# Patient Record
Sex: Male | Born: 1945 | Race: White | Hispanic: No | Marital: Married | Smoking: Never smoker
Health system: Southern US, Community
[De-identification: ages and names within clinical notes are randomized; demographics above are authoritative.]

## PROBLEM LIST (undated history)

## (undated) DIAGNOSIS — D649 Anemia, unspecified: Secondary | ICD-10-CM

## (undated) DIAGNOSIS — Z8669 Personal history of other diseases of the nervous system and sense organs: Secondary | ICD-10-CM

## (undated) DIAGNOSIS — Z8659 Personal history of other mental and behavioral disorders: Secondary | ICD-10-CM

## (undated) DIAGNOSIS — I7121 Aneurysm of the ascending aorta, without rupture: Secondary | ICD-10-CM

## (undated) DIAGNOSIS — C4492 Squamous cell carcinoma of skin, unspecified: Secondary | ICD-10-CM

## (undated) DIAGNOSIS — R112 Nausea with vomiting, unspecified: Secondary | ICD-10-CM

## (undated) DIAGNOSIS — J189 Pneumonia, unspecified organism: Secondary | ICD-10-CM

## (undated) DIAGNOSIS — Z8719 Personal history of other diseases of the digestive system: Secondary | ICD-10-CM

## (undated) DIAGNOSIS — K219 Gastro-esophageal reflux disease without esophagitis: Secondary | ICD-10-CM

## (undated) DIAGNOSIS — H269 Unspecified cataract: Secondary | ICD-10-CM

## (undated) DIAGNOSIS — A6920 Lyme disease, unspecified: Secondary | ICD-10-CM

## (undated) DIAGNOSIS — Z79811 Long term (current) use of aromatase inhibitors: Secondary | ICD-10-CM

## (undated) DIAGNOSIS — Z87898 Personal history of other specified conditions: Secondary | ICD-10-CM

## (undated) DIAGNOSIS — F1910 Other psychoactive substance abuse, uncomplicated: Secondary | ICD-10-CM

## (undated) DIAGNOSIS — Z9689 Presence of other specified functional implants: Secondary | ICD-10-CM

## (undated) DIAGNOSIS — G8929 Other chronic pain: Secondary | ICD-10-CM

## (undated) DIAGNOSIS — E785 Hyperlipidemia, unspecified: Secondary | ICD-10-CM

## (undated) DIAGNOSIS — N200 Calculus of kidney: Secondary | ICD-10-CM

## (undated) DIAGNOSIS — B001 Herpesviral vesicular dermatitis: Secondary | ICD-10-CM

## (undated) DIAGNOSIS — G479 Sleep disorder, unspecified: Secondary | ICD-10-CM

## (undated) DIAGNOSIS — D689 Coagulation defect, unspecified: Secondary | ICD-10-CM

## (undated) DIAGNOSIS — R519 Headache, unspecified: Secondary | ICD-10-CM

## (undated) DIAGNOSIS — T7840XA Allergy, unspecified, initial encounter: Secondary | ICD-10-CM

## (undated) DIAGNOSIS — Z9889 Other specified postprocedural states: Secondary | ICD-10-CM

## (undated) DIAGNOSIS — R55 Syncope and collapse: Secondary | ICD-10-CM

## (undated) DIAGNOSIS — R911 Solitary pulmonary nodule: Secondary | ICD-10-CM

## (undated) DIAGNOSIS — M5416 Radiculopathy, lumbar region: Secondary | ICD-10-CM

## (undated) DIAGNOSIS — F419 Anxiety disorder, unspecified: Secondary | ICD-10-CM

## (undated) DIAGNOSIS — K449 Diaphragmatic hernia without obstruction or gangrene: Secondary | ICD-10-CM

## (undated) DIAGNOSIS — I10 Essential (primary) hypertension: Secondary | ICD-10-CM

## (undated) DIAGNOSIS — N189 Chronic kidney disease, unspecified: Secondary | ICD-10-CM

## (undated) DIAGNOSIS — I2510 Atherosclerotic heart disease of native coronary artery without angina pectoris: Secondary | ICD-10-CM

## (undated) DIAGNOSIS — I700 Atherosclerosis of aorta: Secondary | ICD-10-CM

## (undated) DIAGNOSIS — I219 Acute myocardial infarction, unspecified: Secondary | ICD-10-CM

## (undated) DIAGNOSIS — F32A Depression, unspecified: Secondary | ICD-10-CM

## (undated) DIAGNOSIS — F329 Major depressive disorder, single episode, unspecified: Secondary | ICD-10-CM

## (undated) DIAGNOSIS — Z87442 Personal history of urinary calculi: Secondary | ICD-10-CM

## (undated) DIAGNOSIS — Z7902 Long term (current) use of antithrombotics/antiplatelets: Secondary | ICD-10-CM

## (undated) DIAGNOSIS — M549 Dorsalgia, unspecified: Secondary | ICD-10-CM

## (undated) DIAGNOSIS — N529 Male erectile dysfunction, unspecified: Secondary | ICD-10-CM

## (undated) DIAGNOSIS — C4491 Basal cell carcinoma of skin, unspecified: Secondary | ICD-10-CM

## (undated) DIAGNOSIS — I519 Heart disease, unspecified: Secondary | ICD-10-CM

## (undated) DIAGNOSIS — N3281 Overactive bladder: Secondary | ICD-10-CM

## (undated) DIAGNOSIS — G4700 Insomnia, unspecified: Secondary | ICD-10-CM

## (undated) HISTORY — DX: Headache, unspecified: R51.9

## (undated) HISTORY — DX: Personal history of other mental and behavioral disorders: Z86.59

## (undated) HISTORY — DX: Insomnia, unspecified: G47.00

## (undated) HISTORY — DX: Lyme disease, unspecified: A69.20

## (undated) HISTORY — DX: Other chronic pain: G89.29

## (undated) HISTORY — DX: Depression, unspecified: F32.A

## (undated) HISTORY — DX: Personal history of other diseases of the digestive system: Z87.19

## (undated) HISTORY — PX: BACK SURGERY: SHX140

## (undated) HISTORY — DX: Overactive bladder: N32.81

## (undated) HISTORY — DX: Radiculopathy, lumbar region: M54.16

## (undated) HISTORY — PX: BASAL CELL CARCINOMA EXCISION: SHX1214

## (undated) HISTORY — DX: Calculus of kidney: N20.0

## (undated) HISTORY — DX: Anxiety disorder, unspecified: F41.9

## (undated) HISTORY — PX: PAIN PUMP IMPLANTATION: SHX330

## (undated) HISTORY — PX: PAIN PUMP REMOVAL: SHX6391

## (undated) HISTORY — DX: Heart disease, unspecified: I51.9

## (undated) HISTORY — DX: Allergy, unspecified, initial encounter: T78.40XA

## (undated) HISTORY — PX: CARDIAC CATHETERIZATION: SHX172

## (undated) HISTORY — PX: UPPER GI ENDOSCOPY: SHX6162

## (undated) HISTORY — DX: Syncope and collapse: R55

## (undated) HISTORY — DX: Hyperlipidemia, unspecified: E78.5

## (undated) HISTORY — DX: Unspecified cataract: H26.9

## (undated) HISTORY — DX: Coagulation defect, unspecified: D68.9

## (undated) HISTORY — DX: Personal history of other specified conditions: Z87.898

## (undated) HISTORY — PX: SPINAL CORD STIMULATOR IMPLANT: SHX2422

## (undated) HISTORY — DX: Dorsalgia, unspecified: M54.9

## (undated) HISTORY — PX: SPINE SURGERY: SHX786

## (undated) HISTORY — DX: Sleep disorder, unspecified: G47.9

## (undated) HISTORY — DX: Other psychoactive substance abuse, uncomplicated: F19.10

## (undated) HISTORY — DX: Personal history of other diseases of the nervous system and sense organs: Z86.69

## (undated) HISTORY — PX: TONSILLECTOMY: SUR1361

## (undated) HISTORY — DX: Essential (primary) hypertension: I10

## (undated) HISTORY — DX: Personal history of urinary calculi: Z87.442

## (undated) HISTORY — PX: FOOT SURGERY: SHX648

## (undated) HISTORY — DX: Chronic kidney disease, unspecified: N18.9

## (undated) HISTORY — PX: CORONARY ANGIOPLASTY: SHX604

---
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
## 1980-09-14 HISTORY — PX: KIDNEY STONE SURGERY: SHX686

## 1982-09-14 HISTORY — PX: SHOULDER SURGERY: SHX246

## 1985-01-26 DIAGNOSIS — N200 Calculus of kidney: Secondary | ICD-10-CM | POA: Insufficient documentation

## 1995-09-15 HISTORY — PX: KIDNEY STONE SURGERY: SHX686

## 1996-09-14 HISTORY — PX: LITHOTRIPSY: SUR834

## 1997-02-15 DIAGNOSIS — M961 Postlaminectomy syndrome, not elsewhere classified: Secondary | ICD-10-CM | POA: Insufficient documentation

## 1997-07-18 DIAGNOSIS — I259 Chronic ischemic heart disease, unspecified: Secondary | ICD-10-CM | POA: Insufficient documentation

## 1997-07-18 DIAGNOSIS — I219 Acute myocardial infarction, unspecified: Secondary | ICD-10-CM | POA: Insufficient documentation

## 1997-09-14 HISTORY — PX: LITHOTRIPSY: SUR834

## 1998-03-14 DIAGNOSIS — Z9889 Other specified postprocedural states: Secondary | ICD-10-CM | POA: Insufficient documentation

## 1999-09-15 DIAGNOSIS — Z85828 Personal history of other malignant neoplasm of skin: Secondary | ICD-10-CM | POA: Insufficient documentation

## 1999-09-15 DIAGNOSIS — Z87442 Personal history of urinary calculi: Secondary | ICD-10-CM | POA: Insufficient documentation

## 1999-09-15 DIAGNOSIS — G629 Polyneuropathy, unspecified: Secondary | ICD-10-CM | POA: Insufficient documentation

## 1999-09-15 DIAGNOSIS — I119 Hypertensive heart disease without heart failure: Secondary | ICD-10-CM | POA: Insufficient documentation

## 1999-09-15 DIAGNOSIS — Z7901 Long term (current) use of anticoagulants: Secondary | ICD-10-CM | POA: Insufficient documentation

## 2000-11-24 DIAGNOSIS — Z8719 Personal history of other diseases of the digestive system: Secondary | ICD-10-CM | POA: Insufficient documentation

## 2001-09-30 DIAGNOSIS — J3089 Other allergic rhinitis: Secondary | ICD-10-CM | POA: Insufficient documentation

## 2001-09-30 DIAGNOSIS — E7800 Pure hypercholesterolemia, unspecified: Secondary | ICD-10-CM | POA: Insufficient documentation

## 2001-09-30 DIAGNOSIS — E785 Hyperlipidemia, unspecified: Secondary | ICD-10-CM | POA: Insufficient documentation

## 2001-09-30 DIAGNOSIS — M5137 Other intervertebral disc degeneration, lumbosacral region: Secondary | ICD-10-CM | POA: Insufficient documentation

## 2001-12-21 DIAGNOSIS — F419 Anxiety disorder, unspecified: Secondary | ICD-10-CM | POA: Insufficient documentation

## 2001-12-21 DIAGNOSIS — F32A Depression, unspecified: Secondary | ICD-10-CM | POA: Insufficient documentation

## 2001-12-21 DIAGNOSIS — R10816 Epigastric abdominal tenderness: Secondary | ICD-10-CM | POA: Insufficient documentation

## 2001-12-21 DIAGNOSIS — R519 Headache, unspecified: Secondary | ICD-10-CM | POA: Insufficient documentation

## 2002-04-03 DIAGNOSIS — M545 Low back pain, unspecified: Secondary | ICD-10-CM | POA: Insufficient documentation

## 2002-10-24 DIAGNOSIS — G4700 Insomnia, unspecified: Secondary | ICD-10-CM | POA: Insufficient documentation

## 2002-10-24 DIAGNOSIS — R5383 Other fatigue: Secondary | ICD-10-CM | POA: Insufficient documentation

## 2009-06-06 DIAGNOSIS — R21 Rash and other nonspecific skin eruption: Secondary | ICD-10-CM | POA: Insufficient documentation

## 2009-12-05 DIAGNOSIS — L989 Disorder of the skin and subcutaneous tissue, unspecified: Secondary | ICD-10-CM | POA: Insufficient documentation

## 2010-06-05 DIAGNOSIS — L819 Disorder of pigmentation, unspecified: Secondary | ICD-10-CM | POA: Insufficient documentation

## 2010-11-20 DIAGNOSIS — D485 Neoplasm of uncertain behavior of skin: Secondary | ICD-10-CM | POA: Insufficient documentation

## 2011-04-30 DIAGNOSIS — L570 Actinic keratosis: Secondary | ICD-10-CM | POA: Insufficient documentation

## 2011-08-17 DIAGNOSIS — C44621 Squamous cell carcinoma of skin of unspecified upper limb, including shoulder: Secondary | ICD-10-CM | POA: Insufficient documentation

## 2012-06-16 DIAGNOSIS — C44711 Basal cell carcinoma of skin of unspecified lower limb, including hip: Secondary | ICD-10-CM | POA: Insufficient documentation

## 2013-08-24 DIAGNOSIS — Z1211 Encounter for screening for malignant neoplasm of colon: Secondary | ICD-10-CM | POA: Insufficient documentation

## 2013-10-17 DIAGNOSIS — H2510 Age-related nuclear cataract, unspecified eye: Secondary | ICD-10-CM | POA: Insufficient documentation

## 2013-12-27 DIAGNOSIS — G5690 Unspecified mononeuropathy of unspecified upper limb: Secondary | ICD-10-CM | POA: Insufficient documentation

## 2013-12-27 DIAGNOSIS — G629 Polyneuropathy, unspecified: Secondary | ICD-10-CM | POA: Insufficient documentation

## 2014-01-03 DIAGNOSIS — N529 Male erectile dysfunction, unspecified: Secondary | ICD-10-CM | POA: Insufficient documentation

## 2014-01-03 DIAGNOSIS — N486 Induration penis plastica: Secondary | ICD-10-CM | POA: Insufficient documentation

## 2014-09-14 HISTORY — PX: LITHOTRIPSY: SUR834

## 2014-09-30 DIAGNOSIS — K2970 Gastritis, unspecified, without bleeding: Secondary | ICD-10-CM | POA: Insufficient documentation

## 2014-09-30 DIAGNOSIS — K3184 Gastroparesis: Secondary | ICD-10-CM | POA: Insufficient documentation

## 2015-03-28 LAB — ABI

## 2015-09-15 HISTORY — PX: CHOLECYSTECTOMY: SHX55

## 2015-09-15 HISTORY — PX: GALLBLADDER SURGERY: SHX652

## 2015-09-15 HISTORY — PX: COLONOSCOPY: SHX174

## 2015-11-27 DIAGNOSIS — H43399 Other vitreous opacities, unspecified eye: Secondary | ICD-10-CM | POA: Insufficient documentation

## 2017-01-19 DIAGNOSIS — M792 Neuralgia and neuritis, unspecified: Secondary | ICD-10-CM | POA: Insufficient documentation

## 2017-01-19 DIAGNOSIS — G588 Other specified mononeuropathies: Secondary | ICD-10-CM | POA: Insufficient documentation

## 2017-01-19 DIAGNOSIS — N50819 Testicular pain, unspecified: Secondary | ICD-10-CM | POA: Insufficient documentation

## 2017-01-19 DIAGNOSIS — G5781 Other specified mononeuropathies of right lower limb: Secondary | ICD-10-CM | POA: Insufficient documentation

## 2017-01-30 DIAGNOSIS — A6920 Lyme disease, unspecified: Secondary | ICD-10-CM | POA: Insufficient documentation

## 2017-07-13 DIAGNOSIS — M5416 Radiculopathy, lumbar region: Secondary | ICD-10-CM | POA: Insufficient documentation

## 2017-07-13 DIAGNOSIS — G8929 Other chronic pain: Secondary | ICD-10-CM | POA: Insufficient documentation

## 2017-09-14 HISTORY — PX: SIGMOIDOSCOPY: SUR1295

## 2018-04-01 DIAGNOSIS — Z462 Encounter for fitting and adjustment of other devices related to nervous system and special senses: Secondary | ICD-10-CM | POA: Insufficient documentation

## 2018-05-30 DIAGNOSIS — I7090 Unspecified atherosclerosis: Secondary | ICD-10-CM | POA: Insufficient documentation

## 2018-05-30 DIAGNOSIS — F419 Anxiety disorder, unspecified: Secondary | ICD-10-CM | POA: Insufficient documentation

## 2018-09-14 DIAGNOSIS — Z9289 Personal history of other medical treatment: Secondary | ICD-10-CM

## 2018-09-14 HISTORY — DX: Personal history of other medical treatment: Z92.89

## 2018-11-16 DIAGNOSIS — C44111 Basal cell carcinoma of skin of unspecified eyelid, including canthus: Secondary | ICD-10-CM | POA: Insufficient documentation

## 2019-07-12 LAB — BASIC METABOLIC PANEL
BUN: 12 (ref 4–21)
CO2: 30 — AB (ref 13–22)
Chloride: 103 (ref 99–108)
Creatinine: 0.8 (ref 0.6–1.3)
Glucose: 92
Potassium: 3.8 (ref 3.4–5.3)
Sodium: 144 (ref 137–147)

## 2019-07-12 LAB — COMPREHENSIVE METABOLIC PANEL
Calcium: 9.5 (ref 8.7–10.7)
GFR calc Af Amer: 116
GFR calc non Af Amer: 96

## 2019-08-16 DIAGNOSIS — F909 Attention-deficit hyperactivity disorder, unspecified type: Secondary | ICD-10-CM | POA: Insufficient documentation

## 2019-08-24 LAB — BASIC METABOLIC PANEL
BUN: 14 (ref 4–21)
CO2: 33 — AB (ref 13–22)
Chloride: 104 (ref 99–108)
Creatinine: 0.8 (ref 0.6–1.3)
Glucose: 83
Potassium: 4.1 (ref 3.4–5.3)
Sodium: 144 (ref 137–147)

## 2019-08-24 LAB — HEPATIC FUNCTION PANEL
ALT: 17 (ref 10–40)
AST: 17 (ref 14–40)
Alkaline Phosphatase: 103 (ref 25–125)
Bilirubin, Total: 0.3

## 2019-08-24 LAB — LIPID PANEL
Cholesterol: 168 (ref 0–200)
HDL: 48 (ref 35–70)
LDL Cholesterol: 96
LDl/HDL Ratio: 3.5
Triglycerides: 121 (ref 40–160)

## 2019-08-24 LAB — CBC AND DIFFERENTIAL
HCT: 48 (ref 41–53)
Hemoglobin: 15.5 (ref 13.5–17.5)
Platelets: 202 (ref 150–399)
WBC: 5.2

## 2019-08-24 LAB — TSH: TSH: 1.9 (ref 0.41–5.90)

## 2019-08-24 LAB — COMPREHENSIVE METABOLIC PANEL
Albumin: 4.2 (ref 3.5–5.0)
Calcium: 9.7 (ref 8.7–10.7)

## 2019-08-24 LAB — CBC: RBC: 5.17 — AB (ref 3.87–5.11)

## 2019-08-24 LAB — PSA: PSA: 0.45

## 2019-09-21 DIAGNOSIS — I10 Essential (primary) hypertension: Secondary | ICD-10-CM | POA: Insufficient documentation

## 2019-09-21 DIAGNOSIS — R0989 Other specified symptoms and signs involving the circulatory and respiratory systems: Secondary | ICD-10-CM | POA: Insufficient documentation

## 2019-09-27 DIAGNOSIS — R519 Headache, unspecified: Secondary | ICD-10-CM | POA: Insufficient documentation

## 2019-09-27 DIAGNOSIS — R251 Tremor, unspecified: Secondary | ICD-10-CM | POA: Insufficient documentation

## 2019-10-17 LAB — VITAMIN B12: Vitamin B-12: 1436

## 2019-10-17 LAB — TSH: TSH: 2.4 (ref 0.41–5.90)

## 2019-10-17 LAB — HEMOGLOBIN A1C: Hemoglobin A1C: 5.1

## 2019-10-26 ENCOUNTER — Ambulatory Visit (INDEPENDENT_AMBULATORY_CARE_PROVIDER_SITE_OTHER): Payer: Medicare Other | Admitting: Gastroenterology

## 2019-10-26 ENCOUNTER — Other Ambulatory Visit: Payer: Self-pay

## 2019-10-26 VITALS — BP 181/94 | HR 64 | Temp 98.1°F | Ht 69.0 in | Wt 146.0 lb

## 2019-10-26 DIAGNOSIS — R1013 Epigastric pain: Secondary | ICD-10-CM | POA: Diagnosis not present

## 2019-10-26 MED ORDER — RABEPRAZOLE SODIUM 20 MG PO TBEC: 20.0000 mg | DELAYED_RELEASE_TABLET | Freq: Two times a day (BID) | ORAL | 1 refills | Status: DC

## 2019-10-26 NOTE — Progress Notes (Signed)
[name] MD, MRCP(U.K) [address]  [facility]  [place], [place] [address]  Main: [phone]  Fax: [phone]   Gastroenterology Consultation  Referring Provider:     Leonel Ramsay, MD Primary Care Physician:  Leonel Ramsay, MD Primary Gastroenterologist:  Dr. [name]  Reason for Consultation:     GERD        HPI:   Grant Young is a 74 y.o. y/o male referred for consultation & management  by Dr. Ola Spurr, Cheral Marker, MD.    He states that for many years he has had episodic upper abdominal pain.  Points just below his xiphisternum.  It usually occurs in the morning and he is woken up from the same.  Gets better when he eats or sometimes worse with certain foods.  No other clear aggravating factors no clear relieving factors.  Not affected by bowel movement.  Had a soft bowel movement daily.  Denies any NSAID use.  The pain is nonradiating and sharp in nature.  Stress does make it worse.  Not sleeping adequately makes it worse.  He has been on AcipHex which helps.  Last episode of this pain he had was more than a few weeks back.  Since then he has been episode free.  He has had his gallbladder taken out in 2017.  He has been evaluated in the past by a gastroenterologist in [place] and underwent an upper endoscopy per his recollection which she recalls was normal except for a few small polyps.  He has also had a colonoscopy in the past.  Denies any weight loss at this time but he had lost some weight previously that he has regained.   No past medical history on file.    Prior to Admission medications   Not on File    No family history on file.   Social History   Tobacco Use  . Smoking status: Not on file  Substance Use Topics  . Alcohol use: Not on file  . Drug use: Not on file    Allergies as of 10/26/2019  . (Not on File)    Review of Systems:    All systems reviewed and negative except where noted in HPI.   Physical Exam:  BP (!)  181/94 Comment: Pt in process of switching blood pressure medications  Pulse 64   Temp 98.1 F (36.7 C)   Ht 5\' 9"  (1.753 m)   Wt 146 lb (66.2 kg)   BMI 21.56 kg/m  No LMP for male patient. Psych:  Alert and cooperative. Normal mood and affect. General:   Alert,  Well-developed, well-nourished, pleasant and cooperative in NAD Head:  Normocephalic and atraumatic. Eyes:  Sclera clear, no icterus.   Conjunctiva pink. Ears:  Normal auditory acuity. Lungs:  Respirations even and unlabored.  Clear throughout to auscultation.   No wheezes, crackles, or rhonchi. No acute distress. Heart:  Regular rate and rhythm; no murmurs, clicks, rubs, or gallops. Abdomen:  Normal bowel sounds.  No bruits.  Soft, non-tender and non-distended without masses, hepatosplenomegaly or hernias noted.  No guarding or rebound tenderness.    Neurologic:  Alert and oriented x3;  grossly normal neurologically. Psych:  Alert and cooperative. Normal mood and affect.  Imaging Studies: No results found.  Assessment and Plan:   TOLULOPE LUCKY is a 74 y.o. y/o male has been referred for GERD.  His symptoms are more suggestive of dyspepsia.  He has been on AcipHex with good  control of his symptoms.  He gets occasional exacerbations probably related to stress.  I discussed that since he has symptom-free at this point of time I do not think we need any further evaluation.  I suggested that he try peppermint oil capsules which will provide him samples of during the pain episode.  If the pain persists and does not resolve like it usually does to contact my office and we will discuss further options at that point of time.  Options could include testing for celiac disease, H. pylori, imaging and if still persists endoscopy at that point of time.  I have informed him that I will be very happy to provide him refills of his AcipHex as needed  Follow up as needed  Dr [name] MD,MRCP(U.K)

## 2019-10-26 NOTE — Addendum Note (Signed)
Addended by: Dorethea Clan on: 10/26/2019 02:16 PM   Modules accepted: Orders

## 2019-11-09 ENCOUNTER — Other Ambulatory Visit: Payer: Self-pay

## 2019-11-09 ENCOUNTER — Ambulatory Visit: Payer: Medicare Other | Admitting: Nurse Practitioner

## 2019-11-09 ENCOUNTER — Encounter: Payer: Self-pay | Admitting: Nurse Practitioner

## 2019-11-09 VITALS — BP 120/82 | HR 66 | Temp 97.6°F | Resp 16 | Ht 69.0 in | Wt 146.0 lb

## 2019-11-09 DIAGNOSIS — R0981 Nasal congestion: Secondary | ICD-10-CM

## 2019-11-09 DIAGNOSIS — M545 Other chronic pain: Secondary | ICD-10-CM

## 2019-11-09 DIAGNOSIS — I10 Essential (primary) hypertension: Secondary | ICD-10-CM | POA: Diagnosis not present

## 2019-11-09 DIAGNOSIS — K219 Gastro-esophageal reflux disease without esophagitis: Secondary | ICD-10-CM

## 2019-11-09 DIAGNOSIS — E782 Mixed hyperlipidemia: Secondary | ICD-10-CM

## 2019-11-09 DIAGNOSIS — F325 Major depressive disorder, single episode, in full remission: Secondary | ICD-10-CM

## 2019-11-09 DIAGNOSIS — F419 Anxiety disorder, unspecified: Secondary | ICD-10-CM

## 2019-11-09 DIAGNOSIS — R519 Headache, unspecified: Secondary | ICD-10-CM | POA: Diagnosis not present

## 2019-11-09 DIAGNOSIS — F5101 Primary insomnia: Secondary | ICD-10-CM

## 2019-11-09 DIAGNOSIS — R110 Nausea: Secondary | ICD-10-CM

## 2019-11-09 DIAGNOSIS — G8929 Other chronic pain: Secondary | ICD-10-CM

## 2019-11-09 NOTE — Patient Instructions (Signed)
To try Claritin 10 mg daily instead of zyrtec Can use flonase 1 spray daily to both nares for nasal congestion  Try to wean down on tizanidine   To clarify al-theanine dose and and why you are taking  To continue propanolol 60 mg twice daily and follow blood pressure

## 2019-11-09 NOTE — Progress Notes (Signed)
Careteam: Patient Care Team: [name], NP as PCP - General (Geriatric Medicine) Mhoon, Larkin Ina, MD (Psychiatry) Dermatology, [facility]  Advanced Directive information Does Patient Have a Medical Advance Directive?: Yes, Type of Advance Directive: [place], Does patient want to make changes to medical advance directive?: No - Patient declined  Allergies  Allergen Reactions  . Ace Inhibitors     Other reaction(s): Cough  . Fluoxetine Anxiety    "made me fall asleep" per pt "bad headaches and "makes Me Crazy" historical allergy noted in [employer] "made me fall asleep" per pt "bad headaches and "makes Me Crazy" Per New Patient Packet.    . Metoclopramide     Other reaction(s): Other (See Comments), Other (See Comments), Unknown Tardive Dyskinesia  historical allergy noted in [employer] Tardive Dyskinesia  Per New Patient Packet.  . Nalbuphine     Used Post Back surgery- Anesthesiologist Error. Patient had Narcotic Withdraw. Per New Patient Packet.   . Other     Other reaction(s): Other (See Comments) Altered mental status in combo with narcotics at previous hospitalization - Full Withdrawal Symptoms Other reaction(s): Rash  . Amoxicillin-Pot Clavulanate Nausea Only    Per New Patient Packet.  . Doxazosin Rash    Other reaction(s): Other - See Comments, Rash UNKNOWN REACTION UNKNOWN REACTION   . Duloxetine Nausea Only    Per New Patient Packet.   Marland Kitchen Penicillins Nausea Only    Per New Patient Packet.  . Tamsulosin Itching and Anxiety    Restless, Flushing, Heavy Chest, Itching, Hyperactive mood and Anxiety. Unable to handle side effects. Per New Patient Packet.    . Trazodone And Nefazodone Itching, Anxiety and Rash    Headache. "INCREASED MY ANXIETY AND HEARTRATE" Flushing, tachycardia "INCREASED MY ANXIETY AND HEARTRATE" Per New Patient Packet.   . Amlodipine     Shaking, unsure of reaction. Per New Patient Packet.    . Cinoxacin      GI Intolerance, and Dizziness. Per New Patient Packet.   . Ciprofloxacin     Other reaction(s): Unknown  . Nebivolol     Other reaction(s): Unknown  . Olanzapine     Headache and unable to sleep for 3 nights. Per New Patient Packet.   . Olmesartan     Other reaction(s): Unknown  . Pregabalin     Confusion, Lack of concentration, dizziness, and likely drowsiness. Per New Patient Packet.    . Prostaglandins     Other reaction(s): Other (See Comments) Intolerance  . Thyroid Hormones     Other reaction(s): Other (See Comments) Thyroid (Nature Thyroid) contraindicated with some of your other medications.  . Zolpidem     Nightmares, Ineffective after 2 days. Per New Patient Packet.   . Duloxetine Hcl     Other reaction(s): Rash  . Fluoxetine Hcl     Other reaction(s): Rash  . Gabapentin     Gives patient many side effects. Per New Patient Packet.    Marland Kitchen Phenytoin Anxiety    Hyperactivity, and Ineffective. Per New Patient Packet.     Chief Complaint  Patient presents with  . Establish Care    New Patient      HPI: Patient is a 74 y.o. male seen in today at the [facility] to establish care.   Has not had his AWV in several years.   Insomnia/chronic pain- taking amitriptyline 10 mg TID, taking melatonin 10 mg at bedtime. Sleeping well.   Headaches- seeing a specialist at  [facility]- has tension headaches, has started propanolol to help get the blood pressure and headaches under control. Today has increase propanolol to 60 mg BID. Reports headache and BP. Using fiorinal as needed    Low testosterone- using anastrole but integrative medication and has testosterone injection that he takes at home.  Vit b12 def- on 5000 units daily  GERD/GI pain/nausea- taking digestive aid daily, using papaya enzymes with breakfast and lunch and digestive aid at dinner. On pepcid 20 mg twice daily, continues on aciphex 20 mg twice daily and hyocyamine 20 mg as needed for cramps. Has made an  appt with GI doctor and sees if needed.  Has zofran PRN and carafate 1 g QID PRN- has not needed recently  Has been told he has yeast in his GI system so taking candicidal twice daily.   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  Previously on xanax 1 mg 1 PRN and 2 at bedtime. He was having problems with this and then was changed to ativan. Started off to 6 tablets a day and decreased to twice daily.  Has not made changes to this due to having a lot of problems with headaches and abdominal pain.  Using cortisolv twice daily to help calm nerves and sleep  Taking glutathione and houttuynuia to decrease inflammation - recommended by integrative medicine. Another supplement he is on vit D 2000 units, nattokinase, Magnesium  for supplement Taking other supplements for relaxation and anxiety    Hyperlipidemia- continues on zetia daily, with heart healthy diet  Chronic back pain- continues to take tizanidine 2 mg PRN generally takes 2 at night to help "sleep" at night.    Allergies- taking zyretec but does not feel like it is helping   Has had a chronic headache with watery drainage and sneezing recently- using a netipot daily and using saline nasal spray.  Has used zyrtec but does not feel like it is helping.  Has been going on for months.  Review of Systems:  Review of Systems  Constitutional: Positive for malaise/fatigue. Negative for chills, fever and weight loss.  HENT: Positive for congestion.   Cardiovascular: Negative for chest pain, claudication and leg swelling.  Gastrointestinal: Positive for abdominal pain and heartburn. Negative for constipation, diarrhea, nausea and vomiting.  Genitourinary: Negative for dysuria, frequency and urgency.  Musculoskeletal: Positive for back pain. Negative for myalgias.  Neurological: Positive for dizziness and headaches.  Psychiatric/Behavioral: Negative for  depression. The patient is nervous/anxious.     Past Medical History:  Diagnosis Date  . Anxiety    Per New Patient Packet  . Chronic back pain    Per New Patient Packet  . Chronic heart disease    Per New Patient Packet  . History of CT scan of brain 09/14/2018   Per New Patient Packet  . History of depression    Per New Patient Packet  . History of gastritis    Per New Patient Packet  . History of headache    Per New Patient Packet  . History of kidney stones    Per New Patient Packet  . History of neuropathy    Per New Patient Packet  . Hypertension    Per New Patient Packet  . Lumbar radicular pain    Per New Patient Packet  . Lyme disease    Per New Patient Packet  . Sleep trouble  Per New Patient Packet   Past Surgical History:  Procedure Laterality Date  . COLONOSCOPY  09/15/2015   Per New Patient Packet  . GALLBLADDER SURGERY  09/15/2015   Gallbladder Removal. Procedure done by Dr.Beverly. Per New Patient Packet  . KIDNEY STONE SURGERY  09/14/1980   Too many to count. Per New Patient Packet 09/14/1980-09/15/1995  . KIDNEY STONE SURGERY  09/15/1995   Too many to count. Per New Patient Packet  . LITHOTRIPSY  09/14/2014   Per New Patient Packet  . LITHOTRIPSY  09/14/1996   No Stints Used. Per New Patient Packet  . LITHOTRIPSY  09/14/1997   No Stints used. Per New Patient Packet  . SIGMOIDOSCOPY  09/14/2017   Per New Patient Packet   Social History:   reports that he has never smoked. He has never used smokeless tobacco. He reports current alcohol use. No history on file for drug.  Family History  Problem Relation Age of Onset  . Heart disease Father   . Stroke Mother   . Dementia Mother   . Kidney Stones Sister   . Anxiety disorder Sister   . OCD Sister     Medications: Patient's Medications  New Prescriptions   No medications on file  Previous Medications   AMITRIPTYLINE (ELAVIL) 10 MG TABLET    Take 20 mg by mouth at bedtime.     ANASTROZOLE (ARIMIDEX) 1 MG TABLET    Take 0.5 mg by mouth 2 (two) times a week.    BUTALBITAL-ASPIRIN-CAFFEINE (FIORINAL) 50-325-40 MG TABLET    Take 1 tablet by mouth as needed for headache. Per New Patient Packet   CYANOCOBALAMIN (VITAMIN B-12) 5000 MCG LOZG    Take by mouth daily. Per New Patient Packet   DIGESTIVE AIDS MIXTURE (DIGESTION GB PO)    Take 1 capsule by mouth daily. Daily at dinner   ESCITALOPRAM (LEXAPRO) 20 MG TABLET    Take 20 mg by mouth daily.    EZETIMIBE (ZETIA) 10 MG TABLET    Take 10 mg by mouth daily. Per New Patient Packet.   FAMOTIDINE (PEPCID) 20 MG TABLET    Take 20 mg by mouth 2 (two) times daily. Per New Patient Packet.   GLUTATHIONE PO    Take by mouth daily. 1 Teaspoon daily. Per New Patient Packet.   HYOSCYAMINE SULFATE (HYOSCYAMINE PO)    Take 20 mg by mouth as needed. Per New Patient Packet   LOPERAMIDE HCL (IMODIUM PO)    Take by mouth as needed. Per New Patient Packet   LORAZEPAM (ATIVAN) 1 MG TABLET    Take 1 mg by mouth 2 (two) times daily. One in the Morning and One at Bedtime. Per New Patient Packet.   MAGNESIUM BISGLYCINATE PO    Take 400 mg by mouth at bedtime.    MELATONIN 10 MG TABS    Take by mouth at bedtime. Per New Patient Packet   MISC NATURAL PRODUCTS (CORTISOLV PO)    Take by mouth 2 (two) times daily. In the Morning and Night. Per New Patient Packet.   MULTIPLE VITAMINS-MINERALS (VITAMIN D3 COMPLETE PO)    Take 2,000 Units by mouth daily. Per New Patient Packet.    NATTOKINASE PO    Take 200 mg by mouth daily.   ONDANSETRON (ZOFRAN) 4 MG TABLET    Take 4 mg by mouth as needed for nausea or vomiting. Per New Patient Packet   OVER THE COUNTER MEDICATION    1 tablet 2 (two) times daily.  Houttuynuia Supreme   OVER THE COUNTER MEDICATION    Take 2 tablets by mouth daily. Candicidal - for yeast   PAPAYA ENZYME PO    Take by mouth 2 (two) times daily. At Breakfast and Lunch. Per New Patient Packet   PROPRANOLOL (INDERAL) 60 MG TABLET    Take 60 mg  by mouth 2 (two) times daily.    RABEPRAZOLE (ACIPHEX) 20 MG TABLET    Take 20 mg by mouth 2 (two) times daily. One in the Morning and One at Bedtime. Per New Patient Packet.   SUCRALFATE (CARAFATE) 1 G TABLET    Take 1 g by mouth as needed. ( x4 ) Daily as needed. Per New Patient Packet   TESTOSTERONE CYPIONATE (DEPO-TESTOSTERONE IM)    Inject 0.4 mLs into the muscle every 14 (fourteen) days. Per New Patient Packet.   TIZANIDINE (ZANAFLEX) 2 MG CAPSULE    Take 2 mg by mouth every 6 (six) hours as needed.   Modified Medications   No medications on file  Discontinued Medications   OVER THE COUNTER MEDICATION    1 capsule 2 (two) times daily. For Neuropathy. Per New Patient Packet.   PROCHLORPERAZINE (COMPAZINE) 10 MG TABLET    Take 10 mg by mouth as directed. Per New Patient Packet    Physical Exam:  Vitals:   11/09/19 1029  BP: 120/82  Pulse: 66  Resp: 16  Temp: 97.6 F (36.4 C)  SpO2: 98%  Weight: 146 lb (66.2 kg)  Height: 5\' 9"  (1.753 m)   Body mass index is 21.56 kg/m. Wt Readings from Last 3 Encounters:  11/09/19 146 lb (66.2 kg)  10/26/19 146 lb (66.2 kg)    Physical Exam Constitutional:      General: He is not in acute distress.    Appearance: He is well-developed. He is not diaphoretic.  HENT:     Head: Normocephalic and atraumatic.     Right Ear: Tympanic membrane, ear canal and external ear normal.     Left Ear: Tympanic membrane, ear canal and external ear normal.     Nose: No congestion.     Mouth/Throat:     Pharynx: No oropharyngeal exudate.  Eyes:     Conjunctiva/sclera: Conjunctivae normal.     Pupils: Pupils are equal, round, and reactive to light.  Cardiovascular:     Rate and Rhythm: Normal rate and regular rhythm.     Heart sounds: Normal heart sounds.  Pulmonary:     Effort: Pulmonary effort is normal.     Breath sounds: Normal breath sounds.  Abdominal:     General: Bowel sounds are normal.     Palpations: Abdomen is soft.  Musculoskeletal:         General: No tenderness.     Cervical back: Normal range of motion and neck supple.  Skin:    General: Skin is warm and dry.  Neurological:     Mental Status: He is alert and oriented to person, place, and time.     Labs reviewed: Basic Metabolic Panel: No results for input(s): NA, K, CL, CO2, GLUCOSE, BUN, CREATININE, CALCIUM, MG, PHOS, TSH in the last 8760 hours. Liver Function Tests: No results for input(s): AST, ALT, ALKPHOS, BILITOT, PROT, ALBUMIN in the last 8760 hours. No results for input(s): LIPASE, AMYLASE in the last 8760 hours. No results for input(s): AMMONIA in the last 8760 hours. CBC: No results for input(s): WBC, NEUTROABS, HGB, HCT, MCV, PLT in the last 8760 hours. Lipid  Panel: No results for input(s): CHOL, HDL, LDLCALC, TRIG, CHOLHDL, LDLDIRECT in the last 8760 hours. TSH: No results for input(s): TSH in the last 8760 hours. A1C: No results found for: HGBA1C   Assessment/Plan 1. Gastroesophageal reflux disease without esophagitis Stable at this time. Long hx of GI issues. Controlled on pepcid BID and aciphex 20 mg daily    2. Essential hypertension -blood pressure at goal today. He has had a hard time getting blood pressure controlled. Continues on dietary modifications with propranolol which has recently been added and increased due to headaches.  3. Chronic intractable headache, unspecified headache type Better with increase control in his bp, he also is having nasal congestion which would could be contributing. Following with neurology at this time.  4. Sinus congestion Taking zyrtec 10 mg daily but does not notice much improvement. Encouraged to continue to use netipot daily, can add flonase 1 spray into nares daily and to change from zyrtec to claritin 10 mg daily  5. Chronic bilateral low back pain without sciatica -followed by pain management, has spinal cord stimulator. Appears to be seeing Dr Holley Raring for chronic pain  6. Nausea Stable at  this time. Has zofran PRN  7. Anxiety -ongoing. Continues on ativan 1 mg BID with lexapro 20 mg daily  8. Primary insomnia -using melatonin 10 mg qhs, amitriptyline and zanaflex to help sleep. He is using zanaflex 2 mg 2 tablets at bedtime "to relax" explained that this was more of a muscle relaxer and encouraged to not use for sedation, he states he will try to slowly cut back at stop using at night.  9. Depression, major, single episode, complete remission ([place]) -in remission and controlled on lexapro  10. hyperlipidemia  -continues on zetia, last LDL 96 To continue heart healthy diet  Next appt: 1 week to follow up headache and bp Carlos American. Harle Battiest  [facility] [phone]   Total time 60 mins:  time greater than 50% of total time spent doing pt counseling and coordination of care and review of chronic conditions and medication management.

## 2019-11-10 ENCOUNTER — Encounter: Payer: Self-pay | Admitting: Nurse Practitioner

## 2019-11-10 NOTE — Addendum Note (Signed)
Addended by: Heriberto Antigua E on: 11/10/2019 10:17 AM   Modules accepted: Orders

## 2019-11-16 ENCOUNTER — Other Ambulatory Visit: Payer: Self-pay

## 2019-11-16 ENCOUNTER — Ambulatory Visit: Payer: Medicare Other | Admitting: Student in an Organized Health Care Education/Training Program

## 2019-11-16 ENCOUNTER — Ambulatory Visit: Payer: Medicare Other | Admitting: Nurse Practitioner

## 2019-11-16 ENCOUNTER — Encounter: Payer: Self-pay | Admitting: Nurse Practitioner

## 2019-11-16 VITALS — BP 182/100 | HR 76 | Temp 98.0°F | Resp 16 | Ht 69.0 in | Wt 146.0 lb

## 2019-11-16 DIAGNOSIS — I10 Essential (primary) hypertension: Secondary | ICD-10-CM

## 2019-11-16 DIAGNOSIS — G8929 Other chronic pain: Secondary | ICD-10-CM

## 2019-11-16 DIAGNOSIS — K219 Gastro-esophageal reflux disease without esophagitis: Secondary | ICD-10-CM | POA: Diagnosis not present

## 2019-11-16 DIAGNOSIS — R519 Headache, unspecified: Secondary | ICD-10-CM | POA: Diagnosis not present

## 2019-11-16 NOTE — Progress Notes (Signed)
Careteam: Patient Care Team: [name], NP as PCP - General (Geriatric Medicine) Mhoon, Larkin Ina, MD (Psychiatry) Dermatology, Kara Mead, MD as Consulting Physician (Gastroenterology)  Advanced Directive information Does Patient Have a Medical Advance Directive?: Yes, Type of Advance Directive: [place], Does patient want to make changes to medical advance directive?: No - Patient declined  Allergies  Allergen Reactions  . Ace Inhibitors     Other reaction(s): Cough  . Fluoxetine Anxiety    "made me fall asleep" per pt "bad headaches and "makes Me Crazy" historical allergy noted in [employer] "made me fall asleep" per pt "bad headaches and "makes Me Crazy" Per New Patient Packet.    . Metoclopramide     Other reaction(s): Other (See Comments), Other (See Comments), Unknown Tardive Dyskinesia  historical allergy noted in [employer] Tardive Dyskinesia  Per New Patient Packet.  . Nalbuphine     Used Post Back surgery- Anesthesiologist Error. Patient had Narcotic Withdraw. Per New Patient Packet.   . Other     Other reaction(s): Other (See Comments) Altered mental status in combo with narcotics at previous hospitalization - Full Withdrawal Symptoms Other reaction(s): Rash  . Amoxicillin-Pot Clavulanate Nausea Only    Per New Patient Packet.  . Doxazosin Rash    Other reaction(s): Other - See Comments, Rash UNKNOWN REACTION UNKNOWN REACTION   . Duloxetine Nausea Only    Per New Patient Packet.   Marland Kitchen Penicillins Nausea Only    Per New Patient Packet.  . Tamsulosin Itching and Anxiety    Restless, Flushing, Heavy Chest, Itching, Hyperactive mood and Anxiety. Unable to handle side effects. Per New Patient Packet.    . Trazodone And Nefazodone Itching, Anxiety and Rash    Headache. "INCREASED MY ANXIETY AND HEARTRATE" Flushing, tachycardia "INCREASED MY ANXIETY AND HEARTRATE" Per New Patient Packet.   . Amlodipine     Shaking,  unsure of reaction. Per New Patient Packet.    . Cinoxacin     GI Intolerance, and Dizziness. Per New Patient Packet.   . Ciprofloxacin     Other reaction(s): Unknown  . Nebivolol     Other reaction(s): Unknown  . Olanzapine     Headache and unable to sleep for 3 nights. Per New Patient Packet.   . Olmesartan     Other reaction(s): Unknown  . Pregabalin     Confusion, Lack of concentration, dizziness, and likely drowsiness. Per New Patient Packet.    . Prostaglandins     Other reaction(s): Other (See Comments) Intolerance  . Thyroid Hormones     Other reaction(s): Other (See Comments) Thyroid (Nature Thyroid) contraindicated with some of your other medications.  . Zolpidem     Nightmares, Ineffective after 2 days. Per New Patient Packet.   . Duloxetine Hcl     Other reaction(s): Rash  . Fluoxetine Hcl     Other reaction(s): Rash  . Gabapentin     Gives patient many side effects. Per New Patient Packet.    Marland Kitchen Phenytoin Anxiety    Hyperactivity, and Ineffective. Per New Patient Packet.     Chief Complaint  Patient presents with  . Follow-up    1 week follow up     HPI: Patient is a 74 y.o. male seen in today at the [facility] for 1 week follow up. New patient established last week. He had just seen neurologist and propranolol was changed to help manage headache and bp.  Blood pressure is up  today- reports it has been up for several days. Reports he has not kept a good log- blood pressures have been ranging in the 150s-160s and was instructed to increase propranolol from 40 mg BID to 60 mg BID. And overall blood pressure has been unchanged. Ranging from 148-169/79-88.  HR 55-63 He is intolerant to a lot of blood pressure medication.  Could not tolerate hydralazine due to "strong pulse" felt his HR in his neck and chest and could not tolerate that.  He could not tolerate losartan due to abdominal pain, strong pulse, headache, benicar gave him a lot of side effects, he  was on for 8-12 weeks.  Clonidine 0.1 mg- extreme dry mouth and no better blood pressure Amlodipine caused shaking Enalapril worked but caused a cough  He was followed by cardiologist in [place] for this but they moved and did not follow up.  Tremor- improved on propranolol    Chronic headache- unsure if this is from blood pressure or separate- following with neurology on this, suggested possibe candesartan or verapamil  He has tired several medication for headache that have not been effective.    GI//GERD- unsure if this is  Related to lymes disease, reports reflux and rumbling in the whole lower GI track, abdominal pain. This comes and goes and has been ongoing for years. Has periods that he wont experience any symptoms then will have an exacerbation.  Sometimes flares will last a few days, sometimes it is month. Occasional diarrhea, generally not constantly. Some nausea.  Will use Carafate when nothing else works and will make a difference in symptoms but has not used recently.  Seeing Dr [name], for his GI issues, but doing good at the time and told not to modify treatment what he was doing.   Needs handicap sticker due to chronic pain and neuropathy. He has appt scheduled with Dr Holley Raring. Spoke with the rep for the spinal cord stimulator- can do MRI with his stimulator which he was told he could not. The FDA (as of March 1st) has approved a different battery for MRI which has been the problem- battery could overheat  Review of Systems:  Review of Systems  Constitutional: Negative for chills, fever and weight loss.  HENT: Negative for tinnitus.   Respiratory: Negative for cough, sputum production and shortness of breath.   Cardiovascular: Negative for chest pain, palpitations and leg swelling.  Gastrointestinal: Positive for abdominal pain, heartburn and nausea. Negative for constipation and diarrhea.  Genitourinary: Negative for dysuria, frequency and urgency.  Musculoskeletal:  Negative for back pain, falls, joint pain and myalgias.  Skin: Negative.   Neurological: Negative for dizziness and headaches.  Psychiatric/Behavioral: Negative for depression and memory loss. The patient does not have insomnia.     Past Medical History:  Diagnosis Date  . Anxiety    Per New Patient Packet  . Chronic back pain    Per New Patient Packet  . Chronic heart disease    Per New Patient Packet  . Depression   . Headache   . History of CT scan of brain 09/14/2018   Per New Patient Packet  . History of depression    Per New Patient Packet  . History of gastritis    Per New Patient Packet  . History of headache    Per New Patient Packet  . History of kidney stones    Per New Patient Packet  . History of neuropathy    Per New Patient Packet  .  Hypertension    Per New Patient Packet  . Insomnia   . Lumbar radicular pain    Per New Patient Packet  . Lyme disease    Per New Patient Packet  . Sleep trouble    Per New Patient Packet  . Substance abuse [facility])    Past Surgical History:  Procedure Laterality Date  . CHOLECYSTECTOMY  2017  . COLONOSCOPY  09/15/2015   Per New Patient Packet  . GALLBLADDER SURGERY  09/15/2015   Gallbladder Removal. Procedure done by Dr.Beverly. Per New Patient Packet  . KIDNEY STONE SURGERY  09/14/1980   Too many to count. Per New Patient Packet 09/14/1980-09/15/1995  . KIDNEY STONE SURGERY  09/15/1995   Too many to count. Per New Patient Packet  . LITHOTRIPSY  09/14/2014   Per New Patient Packet  . LITHOTRIPSY  09/14/1996   No Stints Used. Per New Patient Packet  . LITHOTRIPSY  09/14/1997   No Stints used. Per New Patient Packet  . SHOULDER SURGERY Right 1984  . SIGMOIDOSCOPY  09/14/2017   Per New Patient Packet   Social History:   reports that he has never smoked. He has never used smokeless tobacco. He reports current alcohol use. He reports previous drug use.  Family History  Problem Relation Age of Onset  . Heart disease  Father   . Heart failure Father   . Hypertension Father   . Stroke Father   . Stroke Mother   . Dementia Mother   . Kidney Stones Daughter   . Anxiety disorder Daughter   . OCD Daughter     Medications: Patient's Medications  New Prescriptions   No medications on file  Previous Medications   AMITRIPTYLINE (ELAVIL) 10 MG TABLET    Take 20 mg by mouth at bedtime.    ANASTROZOLE (ARIMIDEX) 1 MG TABLET    Take 0.5 mg by mouth 2 (two) times a week.    BUTALBITAL-ASPIRIN-CAFFEINE (FIORINAL) 50-325-40 MG TABLET    Take 1 tablet by mouth as needed for headache.    CYANOCOBALAMIN (VITAMIN B-12) 5000 MCG LOZG    Take 1 lozenge by mouth daily.    DIGESTIVE AIDS MIXTURE (DIGESTION GB PO)    Take 1 capsule by mouth daily.    ESCITALOPRAM (LEXAPRO) 20 MG TABLET    Take 20 mg by mouth daily.    EZETIMIBE (ZETIA) 10 MG TABLET    Take 10 mg by mouth daily.    FAMOTIDINE (PEPCID) 20 MG TABLET    Take 20 mg by mouth 2 (two) times daily.    GLUTATHIONE PO    Take 1 tablet by mouth daily.    HYOSCYAMINE SULFATE (HYOSCYAMINE PO)    NEED TO VERIFY DOSAGE.   LOPERAMIDE HCL (IMODIUM PO)    Take by mouth as needed.    LORAZEPAM (ATIVAN) 1 MG TABLET    Take 1 mg by mouth 2 (two) times daily.    MAGNESIUM BISGLYCINATE PO    Take 400 mg by mouth at bedtime.    MELATONIN 10 MG TABS    Take 10 mg by mouth at bedtime.    MISC NATURAL PRODUCTS (CORTISOLV PO)    Take by mouth 2 (two) times daily.    MULTIPLE VITAMINS-MINERALS (VITAMIN D3 COMPLETE PO)    Take 2,000 Units by mouth daily.    NATTOKINASE PO    Take 200 mg by mouth daily.   ONDANSETRON (ZOFRAN) 4 MG TABLET    Take 4 mg by mouth as  needed for nausea or vomiting.    OVER THE COUNTER MEDICATION    Take 1 tablet by mouth 2 (two) times daily. [place] Supreme    OVER THE COUNTER MEDICATION    Take 2 tablets by mouth daily. Candicidal - for yeast   PAPAYA ENZYME PO    Take by mouth 2 (two) times daily.    PROPRANOLOL (INDERAL) 60 MG TABLET    Take 60 mg  by mouth 2 (two) times daily.    RABEPRAZOLE (ACIPHEX) 20 MG TABLET    Take 20 mg by mouth 2 (two) times daily.    SUCRALFATE (CARAFATE) 1 G TABLET    Take 1 g by mouth 4 (four) times daily as needed.    TESTOSTERONE CYPIONATE (DEPO-TESTOSTERONE IM)    Inject 0.4 mLs into the muscle every 14 (fourteen) days.    TIZANIDINE (ZANAFLEX) 2 MG CAPSULE    Take 2 mg by mouth every 6 (six) hours as needed.   Modified Medications   No medications on file  Discontinued Medications   No medications on file    Physical Exam:  There were no vitals filed for this visit. There is no height or weight on file to calculate BMI. Wt Readings from Last 3 Encounters:  11/09/19 146 lb (66.2 kg)  10/26/19 146 lb (66.2 kg)    Physical Exam Constitutional:      General: He is not in acute distress.    Appearance: He is well-developed. He is not diaphoretic.  HENT:     Head: Normocephalic and atraumatic.     Mouth/Throat:     Pharynx: No oropharyngeal exudate.  Eyes:     Conjunctiva/sclera: Conjunctivae normal.     Pupils: Pupils are equal, round, and reactive to light.  Cardiovascular:     Rate and Rhythm: Normal rate and regular rhythm.     Heart sounds: Normal heart sounds.  Pulmonary:     Effort: Pulmonary effort is normal.     Breath sounds: Normal breath sounds.  Abdominal:     General: Bowel sounds are normal.     Palpations: Abdomen is soft.  Musculoskeletal:        General: No tenderness.     Cervical back: Normal range of motion and neck supple.  Skin:    General: Skin is warm and dry.  Neurological:     Mental Status: He is alert and oriented to person, place, and time.     Labs reviewed: Basic Metabolic Panel: Recent Labs    07/12/19 0000 08/24/19 0000 10/17/19 0000  NA 144 144  --   K 3.8 4.1  --   CL 103 104  --   CO2 30* 33*  --   BUN 12 14  --   CREATININE 0.8 0.8  --   CALCIUM 9.5 9.7  --   TSH  --  1.90 2.40   Liver Function Tests: Recent Labs     08/24/19 0000  AST 17  ALT 17  ALKPHOS 103  ALBUMIN 4.2   No results for input(s): LIPASE, AMYLASE in the last 8760 hours. No results for input(s): AMMONIA in the last 8760 hours. CBC: Recent Labs    08/24/19 0000  WBC 5.2  HGB 15.5  HCT 48  PLT 202   Lipid Panel: Recent Labs    08/24/19 0000  CHOL 168  HDL 48  LDLCALC 96  TRIG 121   TSH: Recent Labs    08/24/19 0000 10/17/19 0000  TSH 1.90  2.40   A1C: Lab Results  Component Value Date   HGBA1C 5.1 10/17/2019     Assessment/Plan 1. Uncontrolled hypertension - pt with uncontrolled htn and has tired several medication that he has either not been able to tolerate, not helped bp or both. As above which was reviewed with pt in detail- Could not tolerate hydralazine due to "strong pulse" felt his HR in his neck and chest and could not tolerate that.  He could not tolerate losartan due to abdominal pain, strong pulse, headache, benicar gave him a lot of side effects, he was on for 8-12 weeks.  Clonidine 0.1 mg- extreme dry mouth and no better blood pressure Amlodipine caused shaking Enalapril worked but caused a cough. At this time encouraged to really evaluate diet to cut out sodium where he can however it does seem like he has a low sodium diet - Ambulatory referral to Cardiology for further evaluation- he had started workup with cardiologist in [place] but moved during work up.   2. Gastroesophageal reflux disease without esophagitis Having a flare, in the past carafate taking routinely over a few days will resolve symptoms. Also encouraged to make appt with GI if persist  3. Chronic intractable headache, unspecified headache type -ongoing, reports exercise helps symptoms. Need to get the blood pressure under control to see if this is contributing. Continues to follow up with neurologist for symptom management.   Next appt: 4 month for routine follow-up Will schedule AWV in 1 month  Veona Bittman K. [place],  [facility] [phone]

## 2019-11-16 NOTE — Patient Instructions (Addendum)
Continue medication as prescribed for blood pressure- if you can not get into cardiologist in a "timely manner" and you want to re-try enalapril or try candesartan (ARB) to call and make appt to see me sooner Take a look at your diet.   To start carafate 4 times daily at this time to help GI symptoms   DASH Eating Plan DASH stands for "Dietary Approaches to Stop Hypertension." The DASH eating plan is a healthy eating plan that has been shown to reduce high blood pressure (hypertension). It may also reduce your risk for type 2 diabetes, heart disease, and stroke. The DASH eating plan may also help with weight loss. What are tips for following this plan?  General guidelines  Avoid eating more than 2,300 mg (milligrams) of salt (sodium) a day. If you have hypertension, you may need to reduce your sodium intake to 1,500 mg a day.  Limit alcohol intake to no more than 1 drink a day for nonpregnant women and 2 drinks a day for men. One drink equals 12 oz of beer, 5 oz of wine, or 1 oz of hard liquor.  Work with your health care provider to maintain a healthy body weight or to lose weight. Ask what an ideal weight is for you.  Get at least 30 minutes of exercise that causes your heart to beat faster (aerobic exercise) most days of the week. Activities may include walking, swimming, or biking.  Work with your health care provider or diet and nutrition specialist (dietitian) to adjust your eating plan to your individual calorie needs. Reading food labels   Check food labels for the amount of sodium per serving. Choose foods with less than 5 percent of the Daily Value of sodium. Generally, foods with less than 300 mg of sodium per serving fit into this eating plan.  To find whole grains, look for the word "whole" as the first word in the ingredient list. Shopping  Buy products labeled as "low-sodium" or "no salt added."  Buy fresh foods. Avoid canned foods and premade or frozen  meals. Cooking  Avoid adding salt when cooking. Use salt-free seasonings or herbs instead of table salt or sea salt. Check with your health care provider or pharmacist before using salt substitutes.  Do not fry foods. Cook foods using healthy methods such as baking, boiling, grilling, and broiling instead.  Cook with heart-healthy oils, such as olive, canola, soybean, or sunflower oil. Meal planning  Eat a balanced diet that includes: ? 5 or more servings of fruits and vegetables each day. At each meal, try to fill half of your plate with fruits and vegetables. ? Up to 6-8 servings of whole grains each day. ? Less than 6 oz of lean meat, poultry, or fish each day. A 3-oz serving of meat is about the same size as a deck of cards. One egg equals 1 oz. ? 2 servings of low-fat dairy each day. ? A serving of nuts, seeds, or beans 5 times each week. ? Heart-healthy fats. Healthy fats called Omega-3 fatty acids are found in foods such as flaxseeds and coldwater fish, like sardines, salmon, and mackerel.  Limit how much you eat of the following: ? Canned or prepackaged foods. ? Food that is high in trans fat, such as fried foods. ? Food that is high in saturated fat, such as fatty meat. ? Sweets, desserts, sugary drinks, and other foods with added sugar. ? Full-fat dairy products.  Do not salt foods before eating.  Try to eat at least 2 vegetarian meals each week.  Eat more home-cooked food and less restaurant, buffet, and fast food.  When eating at a restaurant, ask that your food be prepared with less salt or no salt, if possible. What foods are recommended? The items listed may not be a complete list. Talk with your dietitian about what dietary choices are best for you. Grains Whole-grain or whole-wheat bread. Whole-grain or whole-wheat pasta. Brown rice. Grant Young. Bulgur. Whole-grain and low-sodium cereals. Pita bread. Low-fat, low-sodium crackers. Whole-wheat flour  tortillas. Vegetables Fresh or frozen vegetables (raw, steamed, roasted, or grilled). Low-sodium or reduced-sodium tomato and vegetable juice. Low-sodium or reduced-sodium tomato sauce and tomato paste. Low-sodium or reduced-sodium canned vegetables. Fruits All fresh, dried, or frozen fruit. Canned fruit in natural juice (without added sugar). Meat and other protein foods Skinless chicken or [place]. Ground chicken or [place]. Pork with fat trimmed off. Fish and seafood. Egg whites. Dried beans, peas, or lentils. Unsalted nuts, nut butters, and seeds. Unsalted canned beans. Lean cuts of beef with fat trimmed off. Low-sodium, lean deli meat. Dairy Low-fat (1%) or fat-free (skim) milk. Fat-free, low-fat, or reduced-fat cheeses. Nonfat, low-sodium ricotta or cottage cheese. Low-fat or nonfat yogurt. Low-fat, low-sodium cheese. Fats and oils Soft margarine without trans fats. Vegetable oil. Low-fat, reduced-fat, or light mayonnaise and salad dressings (reduced-sodium). Canola, safflower, olive, soybean, and sunflower oils. Avocado. Seasoning and other foods Herbs. Spices. Seasoning mixes without salt. Unsalted popcorn and pretzels. Fat-free sweets. What foods are not recommended? The items listed may not be a complete list. Talk with your dietitian about what dietary choices are best for you. Grains Baked goods made with fat, such as croissants, muffins, or some breads. Dry pasta or rice meal packs. Vegetables Creamed or fried vegetables. Vegetables in a cheese sauce. Regular canned vegetables (not low-sodium or reduced-sodium). Regular canned tomato sauce and paste (not low-sodium or reduced-sodium). Regular tomato and vegetable juice (not low-sodium or reduced-sodium). Grant Young. Olives. Fruits Canned fruit in a light or heavy syrup. Fried fruit. Fruit in cream or butter sauce. Meat and other protein foods Fatty cuts of meat. Ribs. Fried meat. Grant Young. Sausage. Bologna and other processed lunch meats.  Salami. Fatback. Hotdogs. Bratwurst. Salted nuts and seeds. Canned beans with added salt. Canned or smoked fish. Whole eggs or egg yolks. Chicken or [place] with skin. Dairy Whole or 2% milk, cream, and half-and-half. Whole or full-fat cream cheese. Whole-fat or sweetened yogurt. Full-fat cheese. Nondairy creamers. Whipped toppings. Processed cheese and cheese spreads. Fats and oils Butter. Stick margarine. Lard. Shortening. Ghee. Bacon fat. Tropical oils, such as coconut, palm kernel, or palm oil. Seasoning and other foods Salted popcorn and pretzels. Onion salt, garlic salt, seasoned salt, table salt, and sea salt. Worcestershire sauce. Tartar sauce. Barbecue sauce. Teriyaki sauce. Soy sauce, including reduced-sodium. Steak sauce. Canned and packaged gravies. Fish sauce. Oyster sauce. Cocktail sauce. Horseradish that you find on the shelf. Ketchup. Mustard. Meat flavorings and tenderizers. Bouillon cubes. Hot sauce and Tabasco sauce. Premade or packaged marinades. Premade or packaged taco seasonings. Relishes. Regular salad dressings. Where to find more information:  National Heart, Lung, and [place]: https://wilson-eaton.com/  American Heart Association: www.heart.org Summary  The DASH eating plan is a healthy eating plan that has been shown to reduce high blood pressure (hypertension). It may also reduce your risk for type 2 diabetes, heart disease, and stroke.  With the DASH eating plan, you should limit salt (sodium) intake to 2,300 mg a day. If  you have hypertension, you may need to reduce your sodium intake to 1,500 mg a day.  When on the DASH eating plan, aim to eat more fresh fruits and vegetables, whole grains, lean proteins, low-fat dairy, and heart-healthy fats.  Work with your health care provider or diet and nutrition specialist (dietitian) to adjust your eating plan to your individual calorie needs. This information is not intended to replace advice given to you by your health  care provider. Make sure you discuss any questions you have with your health care provider. Document Revised: 08/13/2017 Document Reviewed: 08/24/2016 Elsevier Patient Education  2020 [employer].

## 2019-11-17 ENCOUNTER — Ambulatory Visit (INDEPENDENT_AMBULATORY_CARE_PROVIDER_SITE_OTHER): Payer: Medicare Other | Admitting: Cardiology

## 2019-11-17 ENCOUNTER — Encounter: Payer: Self-pay | Admitting: Cardiology

## 2019-11-17 ENCOUNTER — Other Ambulatory Visit: Payer: Self-pay

## 2019-11-17 VITALS — BP 176/92 | HR 77 | Ht 69.0 in | Wt 145.0 lb

## 2019-11-17 DIAGNOSIS — I10 Essential (primary) hypertension: Secondary | ICD-10-CM | POA: Diagnosis not present

## 2019-11-17 DIAGNOSIS — I2510 Atherosclerotic heart disease of native coronary artery without angina pectoris: Secondary | ICD-10-CM | POA: Diagnosis not present

## 2019-11-17 MED ORDER — CHLORTHALIDONE 25 MG PO TABS: 25.0000 mg | ORAL_TABLET | Freq: Every day | ORAL | 3 refills | Status: DC

## 2019-11-17 NOTE — Progress Notes (Signed)
Cardiology Office Note:    Date:  11/17/2019   ID:  Grant Young, DOB 03-19-1946, MRN [id number]  PCP:  Grant Chandler, NP  Cardiologist:  Grant Sable, MD  Electrophysiologist:  None   Referring MD: Grant Chandler, NP   Chief Complaint  Patient presents with  . New Patient (Initial Visit)    Referred by PCP for uncontrolled HTN and persistent headaches. Meds reviewed verbally with patient.     History of Present Illness:    Grant Young is a 74 y.o. male with a hx of anxiety, hypertension, CAD/MI in 1998 who presents due to uncontrolled blood pressure.  Patient states having uncontrolled blood pressure for years now.  He has tried a lot of medication but has lots of side effects.  Also states being diagnosed with Lyme disease and not sure if his diagnosis of Lyme disease is contributing to his symptoms.  Upon chart review chart, patient not tolerant to a lot of BP meds as stated below.  "Could not tolerate hydralazine due to "strong pulse" felt his HR in his neck and chest and could not tolerate that. He could not tolerate losartan due to abdominal pain, strong pulse, headache, benicar gave him a lot of side effects, he was on for 8-12 weeks.  Clonidine 0.1 mg- extreme dry mouth and no better blood pressure. Amlodipine caused shaking. Enalapril worked but caused a cough"  Upon further discussion with patient into specifics of adverse effects of blood pressure he tried Benicar but did not see any improvement in side effects and stopped.  He also tried chlorthalidone in the past due to history of kidney stones without any side effects.  He was recently started on propanolol which she currently takes due to history of tremors and headaches.  Denies any chest pain or shortness of breath at rest or with exertion.  He has a history of CAD/MI in [place] back in 1998.  He underwent balloon angioplasty without any stenting because lesion was at the bifurcation.  The  following year in 1999, he had another MI and underwent another balloon angioplasty.  He states feeling well since then.  Has had Lexiscan stress test last in 2017 which was normal.  Currently asymptomatic.  Past Medical History:  Diagnosis Date  . Anxiety    Per New Patient Packet  . Chronic back pain    Per New Patient Packet  . Chronic heart disease    Per New Patient Packet  . Depression   . Headache   . History of CT scan of brain 09/14/2018   Per New Patient Packet  . History of depression    Per New Patient Packet  . History of gastritis    Per New Patient Packet  . History of headache    Per New Patient Packet  . History of kidney stones    Per New Patient Packet  . History of neuropathy    Per New Patient Packet  . Hypertension    Per New Patient Packet  . Insomnia   . Lumbar radicular pain    Per New Patient Packet  . Lyme disease    Per New Patient Packet  . Sleep trouble    Per New Patient Packet  . Substance abuse [facility])     Past Surgical History:  Procedure Laterality Date  . BACK SURGERY    . CHOLECYSTECTOMY  2017  . COLONOSCOPY  09/15/2015   Per New Patient Packet  .  GALLBLADDER SURGERY  09/15/2015   Gallbladder Removal. Procedure done by Dr.Beverly. Per New Patient Packet  . KIDNEY STONE SURGERY  09/14/1980   Too many to count. Per New Patient Packet 09/14/1980-09/15/1995  . KIDNEY STONE SURGERY  09/15/1995   Too many to count. Per New Patient Packet  . LITHOTRIPSY  09/14/2014   Per New Patient Packet  . LITHOTRIPSY  09/14/1996   No Stints Used. Per New Patient Packet  . LITHOTRIPSY  09/14/1997   No Stints used. Per New Patient Packet  . PAIN PUMP IMPLANTATION    . PAIN PUMP REMOVAL    . SHOULDER SURGERY Right 1984  . SIGMOIDOSCOPY  09/14/2017   Per New Patient Packet  . SPINAL CORD STIMULATOR IMPLANT    . TONSILLECTOMY      Current Medications: Current Meds  Medication Sig  . amitriptyline (ELAVIL) 10 MG tablet Take 20 mg by mouth at  bedtime.   Marland Kitchen anastrozole (ARIMIDEX) 1 MG tablet Take 0.5 mg by mouth 2 (two) times a week.   . butalbital-aspirin-caffeine (FIORINAL) 50-325-40 MG tablet Take 1 tablet by mouth as needed for headache.   . Cyanocobalamin (VITAMIN B-12) 5000 MCG LOZG Take 1 lozenge by mouth daily.   . Digestive Aids Mixture (DIGESTION GB PO) Take 1 capsule by mouth daily.   Marland Kitchen escitalopram (LEXAPRO) 20 MG tablet Take 20 mg by mouth daily.   Marland Kitchen ezetimibe (ZETIA) 10 MG tablet Take 10 mg by mouth daily.   . famotidine (PEPCID) 20 MG tablet Take 20 mg by mouth 2 (two) times daily.   Marland Kitchen GLUTATHIONE PO Take 1 tablet by mouth daily.   Marland Kitchen Hyoscyamine Sulfate (HYOSCYAMINE PO) 1 tablet as needed. Every 5 minutes as needed  . Loperamide HCl (IMODIUM PO) Take by mouth as needed.   Marland Kitchen LORazepam (ATIVAN) 1 MG tablet Take 1 mg by mouth 2 (two) times daily.   Marland Kitchen MAGNESIUM BISGLYCINATE PO Take 400 mg by mouth at bedtime.   . Melatonin 10 MG TABS Take 10 mg by mouth at bedtime.   . Misc Natural Products (CORTISOLV PO) Take by mouth 2 (two) times daily.   . Multiple Vitamins-Minerals (VITAMIN D3 COMPLETE PO) Take 2,000 Units by mouth daily.   Marland Kitchen NATTOKINASE PO Take 200 mg by mouth daily.  . ondansetron (ZOFRAN) 4 MG tablet Take 4 mg by mouth as needed for nausea or vomiting.   Marland Kitchen OVER THE COUNTER MEDICATION Take 1 tablet by mouth 2 (two) times daily. Houttu[place]uia Supreme   . OVER THE COUNTER MEDICATION Take 2 tablets by mouth daily. Candicidal - for yeast  . PAPAYA ENZYME PO Take by mouth 2 (two) times daily.   . propranolol (INDERAL) 60 MG tablet Take 60 mg by mouth 2 (two) times daily.   . RABEprazole (ACIPHEX) 20 MG tablet Take 20 mg by mouth 2 (two) times daily.   . sucralfate (CARAFATE) 1 g tablet Take 1 g by mouth 4 (four) times daily as needed.   . Testosterone Cypionate (DEPO-TESTOSTERONE IM) Inject 0.4 mLs into the muscle every 14 (fourteen) days.   . tizanidine (ZANAFLEX) 2 MG capsule Take 2 mg by mouth every 6 (six) hours as  needed.      Allergies:   Ace inhibitors, Fluoxetine, Metoclopramide, Nalbuphine, Other, Amoxicillin-pot clavulanate, Doxazosin, Duloxetine, Penicillins, Tamsulosin, Trazodone and nefazodone, Amlodipine, Cinoxacin, Ciprofloxacin, Nebivolol, Olanzapine, Olmesartan, Pregabalin, Prostaglandins, Thyroid hormones, Zolpidem, Duloxetine hcl, Fluoxetine hcl, Gabapentin, and Phenytoin   Social History   Socioeconomic History  . Marital status:  Married    Spouse name: Not on file  . Number of children: Not on file  . Years of education: Not on file  . Highest education level: Not on file  Occupational History  . Not on file  Tobacco Use  . Smoking status: Never Smoker  . Smokeless tobacco: Never Used  Substance and Sexual Activity  . Alcohol use: Yes    Comment: 1 Drink a Month.  . Drug use: Not Currently  . Sexual activity: Not on file  Other Topics Concern  . Not on file  Social History Narrative   Tobacco use, amount per day now: None   Past tobacco use, amount per day: None   How many years did you use tobacco: 0   Alcohol use (drinks per week): 0-1 Month   Diet:   Do you drink/eat things with caffeine: Occasionally ( Hot Chocolate and maybe 1/4 of 16oz Pepsi 2-3 times a week.   Marital status: Married                                  What year were you married? 1970   Do you live in a house, apartment, assisted living, condo, trailer, etc.? House   Is it one or more stories? One   How many persons live in your home? 2   Do you have pets in your home?( please list)  No   Highest Level of education completed: Masters   Current or past profession: Intensive Transport planner for [employer]   Do you exercise? Yes                                    Type and how often? [place], Recumbent Bike, 3 times a week. Try to get a 1.2 mile walk at least 3 times a week or more.     Do you have a living will? Yes   Do you have a DNR form?  No                                 If  not, do you want to discuss one?   Do you have signed POA/HPOA forms? Yes                       If so, please bring to you appointment    Do you have any difficulty bathing or dressing yourself? No    Do you have difficulty preparing food or eating? No   Do you have difficulty managing your medications? No   Do you have any difficulty managing your finances? No   Do you have any difficulty affording your medications? No         Social Determinants of Health   Financial Resource Strain:   . Difficulty of Paying Living Expenses: Not on file  Food Insecurity:   . Worried About Charity fundraiser in the Last Year: Not on file  . Ran Out of Food in the Last Year: Not on file  Transportation Needs:   . Lack of Transportation (Medical): Not on file  . Lack of Transportation (Non-Medical): Not on file  Physical Activity:   . Days of Exercise per Week: Not on file  . Minutes of  Exercise per Session: Not on file  Stress:   . Feeling of Stress : Not on file  Social Connections:   . Frequency of Communication with Friends and Family: Not on file  . Frequency of Social Gatherings with Friends and Family: Not on file  . Attends Religious Services: Not on file  . Active Member of Clubs or Organizations: Not on file  . Attends Archivist Meetings: Not on file  . Marital Status: Not on file     Family History: The patient's family history includes Anxiety disorder in his daughter; Dementia in his mother; Heart disease in his father; Heart failure in his father; Hypertension in his father; Kidney Stones in his daughter; OCD in his daughter; Stroke in his father and mother.  ROS:   Please see the history of present illness.     All other systems reviewed and are negative.  EKGs/Labs/Other Studies Reviewed:    The following studies were reviewed today:   EKG:  EKG is  ordered today.  The ekg ordered today demonstrates sinus rhythm, nonspecific ST changes.  Recent  Labs: 08/24/2019: ALT 17; BUN 14; Creatinine 0.8; Hemoglobin 15.5; Platelets 202; Potassium 4.1; Sodium 144 10/17/2019: TSH 2.40  Recent Lipid Panel    Component Value Date/Time   CHOL 168 08/24/2019 0000   TRIG 121 08/24/2019 0000   HDL 48 08/24/2019 0000   LDLCALC 96 08/24/2019 0000    Physical Exam:    VS:  BP (!) 176/92 (BP Location: Right Arm, Patient Position: Sitting, Cuff Size: Normal)   Pulse 77   Ht 5\' 9"  (1.753 m)   Wt 145 lb (65.8 kg)   SpO2 98%   BMI 21.41 kg/m     Wt Readings from Last 3 Encounters:  11/17/19 145 lb (65.8 kg)  11/16/19 146 lb (66.2 kg)  11/09/19 146 lb (66.2 kg)     GEN:  Well nourished, well developed in no acute distress HEENT: Normal NECK: No JVD; No carotid bruits LYMPHATICS: No lymphadenopathy CARDIAC: RRR, no murmurs, rubs, gallops RESPIRATORY:  Clear to auscultation without rales, wheezing or rhonchi  ABDOMEN: Soft, non-tender, non-distended MUSCULOSKELETAL:  No edema; No deformity  SKIN: Warm and dry NEUROLOGIC:  Alert and oriented x 3 PSYCHIATRIC:  Normal affect   ASSESSMENT:    1. Essential hypertension   2. Coronary artery disease involving native coronary artery of native heart without angina pectoris    PLAN:    In order of problems listed above:  1. Patient with uncontrolled blood pressure.  Has lots of side effects from blood pressure meds in different classes.  He seems to have tolerated chlorthalidone in the past.  We will start with chlorthalidone 25 mg daily.  He will follow-up in 2 weeks.  If blood pressure still elevated, plan to add Benicar as he has tolerated this in the past. 2. Has history of CAD status status post balloon angioplasty.  Get echocardiogram to evaluate ejection fraction.  He is asymptomatic.  Aspirin was recommended but patient states having GI upset with aspirin.  Follow-up in 2 weeks for blood pressure and medication titration  Total encounter time more than 65 minutes  Greater than 50% was  spent in counseling and coordination of care with the patient. Total time spent going through patient history, discussing extensive list of blood pressure medications tried and side effects observed by patient, discussing side effects of different blood pressure medication classes, reasoning for starting blood pressure medication single dose at a time  versus combination pill.   This note was generated in part or whole with voice recognition software. Voice recognition is usually quite accurate but there are transcription errors that can and very often do occur. I apologize for any typographical errors that were not detected and corrected.  Medication Adjustments/Labs and Tests Ordered: Current medicines are reviewed at length with the patient today.  Concerns regarding medicines are outlined above.  Orders Placed This Encounter  Procedures  . EKG 12-Lead  . ECHOCARDIOGRAM COMPLETE   Meds ordered this encounter  Medications  . chlorthalidone (HYGROTON) 25 MG tablet    Sig: Take 1 tablet (25 mg total) by mouth daily.    Dispense:  90 tablet    Refill:  3    Patient Instructions  Medication Instructions:  Your physician has recommended you make the following change in your medication:  1. Start Chlorthalidone 25 mg take one tablet by mouth daily.  *If you need a refill on your cardiac medications before your next appointment, please call your pharmacy*   Lab Work: None If you have labs (blood work) drawn today and your tests are completely normal, you will receive your results only by: Marland Kitchen MyChart Message (if you have MyChart) OR . A paper copy in the mail If you have any lab test that is abnormal or we need to change your treatment, we will call you to review the results.   Testing/Procedures: Your physician has requested that you have an echocardiogram. Echocardiography is a painless test that uses sound waves to create images of your heart. It provides your doctor with information  about the size and shape of your heart and how well your heart's chambers and valves are working. This procedure takes approximately one hour. There are no restrictions for this procedure.     Follow-Up: At [facility], you and your health needs are our priority.  As part of our continuing mission to provide you with exceptional heart care, we have created designated Provider Care Teams.  These Care Teams include your primary Cardiologist (physician) and Advanced Practice Providers (APPs -  Physician Assistants and Nurse Practitioners) who all work together to provide you with the care you need, when you need it.  We recommend signing up for the patient portal called "MyChart".  Sign up information is provided on this After Visit Summary.  MyChart is used to connect with patients for Virtual Visits (Telemedicine).  Patients are able to view lab/test results, encounter notes, upcoming appointments, etc.  Non-urgent messages can be sent to your provider as well.   To learn more about what you can do with MyChart, go to [email or web address].    Your next appointment:   2 week(s)  The format for your next appointment:   In Person  Provider:    You may see Grant Sable, MD or one of the following Advanced Practice Providers on your designated Care Team:    Murray Hodgkins, NP  Christell Faith, PA-C  Marrianne Mood, PA-C    Other Instructions  Echocardiogram An echocardiogram is a procedure that uses painless sound waves (ultrasound) to produce an image of the heart. Images from an echocardiogram can provide important information about:  Signs of coronary artery disease (CAD).  Aneurysm detection. An aneurysm is a weak or damaged part of an artery wall that bulges out from the normal force of blood pumping through the body.  Heart size and shape. Changes in the size or shape of the heart can be associated with  certain conditions, including heart failure, aneurysm, and  CAD.  Heart muscle function.  Heart valve function.  Signs of a past heart attack.  Fluid buildup around the heart.  Thickening of the heart muscle.  A tumor or infectious growth around the heart valves. Tell a health care provider about:  Any allergies you have.  All medicines you are taking, including vitamins, herbs, eye drops, creams, and over-the-counter medicines.  Any blood disorders you have.  Any surgeries you have had.  Any medical conditions you have.  Whether you are pregnant or may be pregnant. What are the risks? Generally, this is a safe procedure. However, problems may occur, including:  Allergic reaction to dye (contrast) that may be used during the procedure. What happens before the procedure? No specific preparation is needed. You may eat and drink normally. What happens during the procedure?   An IV tube may be inserted into one of your veins.  You may receive contrast through this tube. A contrast is an injection that improves the quality of the pictures from your heart.  A gel will be applied to your chest.  A wand-like tool (transducer) will be moved over your chest. The gel will help to transmit the sound waves from the transducer.  The sound waves will harmlessly bounce off of your heart to allow the heart images to be captured in real-time motion. The images will be recorded on a computer. The procedure may vary among health care providers and hospitals. What happens after the procedure?  You may return to your normal, everyday life, including diet, activities, and medicines, unless your health care provider tells you not to do that. Summary  An echocardiogram is a procedure that uses painless sound waves (ultrasound) to produce an image of the heart.  Images from an echocardiogram can provide important information about the size and shape of your heart, heart muscle function, heart valve function, and fluid buildup around your  heart.  You do not need to do anything to prepare before this procedure. You may eat and drink normally.  After the echocardiogram is completed, you may return to your normal, everyday life, unless your health care provider tells you not to do that. This information is not intended to replace advice given to you by your health care provider. Make sure you discuss any questions you have with your health care provider. Document Revised: 12/22/2018 Document Reviewed: 10/03/2016 Elsevier Patient Education  2020 [place], Grant Sable, MD  11/17/2019 12:11 PM    [place]

## 2019-11-17 NOTE — Patient Instructions (Signed)
Medication Instructions:  Your physician has recommended you make the following change in your medication:  1. Start Chlorthalidone 25 mg take one tablet by mouth daily.  *If you need a refill on your cardiac medications before your next appointment, please call your pharmacy*   Lab Work: None If you have labs (blood work) drawn today and your tests are completely normal, you will receive your results only by: Marland Kitchen MyChart Message (if you have MyChart) OR . A paper copy in the mail If you have any lab test that is abnormal or we need to change your treatment, we will call you to review the results.   Testing/Procedures: Your physician has requested that you have an echocardiogram. Echocardiography is a painless test that uses sound waves to create images of your heart. It provides your doctor with information about the size and shape of your heart and how well your heart's chambers and valves are working. This procedure takes approximately one hour. There are no restrictions for this procedure.     Follow-Up: At [facility], you and your health needs are our priority.  As part of our continuing mission to provide you with exceptional heart care, we have created designated Provider Care Teams.  These Care Teams include your primary Cardiologist (physician) and Advanced Practice Providers (APPs -  Physician Assistants and Nurse Practitioners) who all work together to provide you with the care you need, when you need it.  We recommend signing up for the patient portal called "MyChart".  Sign up information is provided on this After Visit Summary.  MyChart is used to connect with patients for Virtual Visits (Telemedicine).  Patients are able to view lab/test results, encounter notes, upcoming appointments, etc.  Non-urgent messages can be sent to your provider as well.   To learn more about what you can do with MyChart, go to [email or web address].    Your next appointment:   2  week(s)  The format for your next appointment:   In Person  Provider:    You may see Kate Sable, MD or one of the following Advanced Practice Providers on your designated Care Team:    Murray Hodgkins, NP  Christell Faith, PA-C  Marrianne Mood, PA-C    Other Instructions  Echocardiogram An echocardiogram is a procedure that uses painless sound waves (ultrasound) to produce an image of the heart. Images from an echocardiogram can provide important information about:  Signs of coronary artery disease (CAD).  Aneurysm detection. An aneurysm is a weak or damaged part of an artery wall that bulges out from the normal force of blood pumping through the body.  Heart size and shape. Changes in the size or shape of the heart can be associated with certain conditions, including heart failure, aneurysm, and CAD.  Heart muscle function.  Heart valve function.  Signs of a past heart attack.  Fluid buildup around the heart.  Thickening of the heart muscle.  A tumor or infectious growth around the heart valves. Tell a health care provider about:  Any allergies you have.  All medicines you are taking, including vitamins, herbs, eye drops, creams, and over-the-counter medicines.  Any blood disorders you have.  Any surgeries you have had.  Any medical conditions you have.  Whether you are pregnant or may be pregnant. What are the risks? Generally, this is a safe procedure. However, problems may occur, including:  Allergic reaction to dye (contrast) that may be used during the procedure. What happens before the procedure? No  specific preparation is needed. You may eat and drink normally. What happens during the procedure?   An IV tube may be inserted into one of your veins.  You may receive contrast through this tube. A contrast is an injection that improves the quality of the pictures from your heart.  A gel will be applied to your chest.  A wand-like tool  (transducer) will be moved over your chest. The gel will help to transmit the sound waves from the transducer.  The sound waves will harmlessly bounce off of your heart to allow the heart images to be captured in real-time motion. The images will be recorded on a computer. The procedure may vary among health care providers and hospitals. What happens after the procedure?  You may return to your normal, everyday life, including diet, activities, and medicines, unless your health care provider tells you not to do that. Summary  An echocardiogram is a procedure that uses painless sound waves (ultrasound) to produce an image of the heart.  Images from an echocardiogram can provide important information about the size and shape of your heart, heart muscle function, heart valve function, and fluid buildup around your heart.  You do not need to do anything to prepare before this procedure. You may eat and drink normally.  After the echocardiogram is completed, you may return to your normal, everyday life, unless your health care provider tells you not to do that. This information is not intended to replace advice given to you by your health care provider. Make sure you discuss any questions you have with your health care provider. Document Revised: 12/22/2018 Document Reviewed: 10/03/2016 Elsevier Patient Education  [place].

## 2019-11-28 ENCOUNTER — Telehealth: Payer: Self-pay

## 2019-11-28 DIAGNOSIS — R0981 Nasal congestion: Secondary | ICD-10-CM

## 2019-11-28 DIAGNOSIS — R519 Headache, unspecified: Secondary | ICD-10-CM

## 2019-11-28 NOTE — Telephone Encounter (Signed)
Patient called and is requesting a referral to an ENT.  States he has been seeing you for headache and HTN.  Has pain and blockage in ears, has sinus issues, and bad headache. Wants CT sinus scan. "I have a gut feeling that I might have something that can be eeked out from a CT scan."  Feels he needs to be seen by an ENT within the next couple of days.

## 2019-11-28 NOTE — Telephone Encounter (Signed)
Patient called and told that a referral was sent to [facility] referral department and that he should hear something by 11/29/19 in regards to an appointment, either from [facility] referral department or the referring ENT office.

## 2019-11-28 NOTE — Telephone Encounter (Signed)
ENT referral placed.

## 2019-12-01 ENCOUNTER — Ambulatory Visit: Payer: Medicare Other | Admitting: Cardiology

## 2019-12-04 ENCOUNTER — Other Ambulatory Visit: Payer: Medicare Other

## 2019-12-11 ENCOUNTER — Ambulatory Visit (INDEPENDENT_AMBULATORY_CARE_PROVIDER_SITE_OTHER): Payer: Medicare Other

## 2019-12-11 ENCOUNTER — Other Ambulatory Visit: Payer: Self-pay

## 2019-12-11 DIAGNOSIS — I2510 Atherosclerotic heart disease of native coronary artery without angina pectoris: Secondary | ICD-10-CM

## 2019-12-11 DIAGNOSIS — I10 Essential (primary) hypertension: Secondary | ICD-10-CM | POA: Diagnosis not present

## 2019-12-18 ENCOUNTER — Other Ambulatory Visit: Payer: Self-pay

## 2019-12-18 ENCOUNTER — Telehealth: Payer: Self-pay

## 2019-12-18 ENCOUNTER — Ambulatory Visit (INDEPENDENT_AMBULATORY_CARE_PROVIDER_SITE_OTHER): Payer: Medicare Other | Admitting: Cardiology

## 2019-12-18 ENCOUNTER — Encounter: Payer: Self-pay | Admitting: Cardiology

## 2019-12-18 VITALS — BP 158/98 | HR 65 | Ht 69.0 in | Wt 143.0 lb

## 2019-12-18 DIAGNOSIS — I2510 Atherosclerotic heart disease of native coronary artery without angina pectoris: Secondary | ICD-10-CM | POA: Diagnosis not present

## 2019-12-18 DIAGNOSIS — R0609 Other forms of dyspnea: Secondary | ICD-10-CM

## 2019-12-18 DIAGNOSIS — R0600 Dyspnea, unspecified: Secondary | ICD-10-CM | POA: Diagnosis not present

## 2019-12-18 DIAGNOSIS — I10 Essential (primary) hypertension: Secondary | ICD-10-CM

## 2019-12-18 NOTE — Telephone Encounter (Signed)
Attempted to call patient x2. Did not leave message, male answered from a company and stated to leave message. Did not feel like its was appropriate to leave a messag.

## 2019-12-18 NOTE — Telephone Encounter (Signed)
Attempted to call patient for 12/19/19 appt to discuss history. Phone answered on first ring and was unable to leave a voicemail r/t to full .

## 2019-12-18 NOTE — Progress Notes (Addendum)
Patient: Grant Young  Service Category: E/M  Provider: [name], MD  DOB: December 23, 1945  DOS: 12/19/2019  Referring Provider: Leonel Ramsay, MD  MRN: [id number]  Setting: Ambulatory outpatient  PCP: [name], NP  Type: New Patient  Specialty: Interventional Pain Management    Location: Office  Delivery: Face-to-face     Primary Reason(s) for Visit: Encounter for initial evaluation of one or more chronic problems (new to examiner) potentially causing chronic pain, and posing a threat to normal musculoskeletal function. (Level of risk: High) CC: Headache (also, low back pain and bilateral leg pain.)  HPI  [name] is a 73 y.o. year old, male patient, who comes today to see [name] for the first time for an initial evaluation of his chronic pain. 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 Today he comes in for evaluation of his Headache (also, low back pain and bilateral leg pain.)  Pain Assessment: Location:   Head Radiating: stays right in the top of the head Onset: More than a month ago Duration: Chronic pain Quality: Throbbing, Pounding Severity: 6 /10 (subjective, self-reported pain score)  Note: Reported level is compatible with observation.                         When using our objective Pain Scale, levels between 6 and 10/10 are said to belong in an emergency room, as it progressively worsens from a 6/10, described as severely limiting, requiring emergency care not usually available at an outpatient pain management facility. At a 6/10 level, communication becomes difficult and requires great effort. Assistance to reach the emergency department may be required. Facial flushing and profuse sweating along with potentially dangerous increases in heart rate and  blood pressure will be evident. Effect on ADL: the headaches just about stop everything Timing: Constant Modifying factors: laying down and closing eyes, hot and cold packs BP: (!) 160/91  HR: 63  Onset and Duration: Date of injury: 14 Work accident, 4 years since back surgery Cause of pain: work accident Severity: NAS-11 at its worse: 9/10, NAS-11 at its best: 4/10, NAS-11 now: 6/10 and NAS-11 on the average: 5/10 Timing: Not influenced by the time of the day Aggravating Factors: Bending, Climbing, Lifiting, Prolonged sitting, Prolonged standing, Surgery made it worse, Twisting and Walking Alleviating Factors: Stretching, Cold packs, Hot packs, Lying down, Nerve blocks, Sleeping and Warm showers or baths Associated Problems: Dizziness, Erectile dysfunction, Fatigue, Impotence, Inability to concentrate, Numbness, Tingling, Pain that wakes patient up and Pain that does not allow patient to sleep Quality of Pain: Disabling, Distressing, Dreadful, Feeling of constriction, Horrible, Nagging, Pressure-like, Shooting, Stabbing, Throbbing and Tingling Previous Examinations or Tests: Nerve conduction test, Neurological evaluation, Chiropractic evaluation and Psychiatric evaluation Previous Treatments: Biofeedback, Chiropractic manipulations, Epidural steroid injections, Facet blocks, Morphine pump, Narcotic medications, Physical Therapy, Pool exercises, Relaxation therapy, Spinal cord stimulator, Stretching exercises, TENS and Trigger point injections  The patient comes into the clinics today for the first time for a chronic pain management evaluation.   [name] is a pleasant 74 year old male who presents with a chief complaint of headaches.  He states that he has been seen by her neurologist and has exhausted many treatments.  Etiology of his headaches are unclear but thought to be secondary to migraines.  Patient  states that he has a constant headache.  Is present for 24 hours out of the day.  He  does endorse associated dizziness, photophobia, phonophobia.  He has tried various therapies for this in the past including Fioricet, NSAIDs, acetaminophen, beta-blockers which were not effective.  Patient's neurologist at [facility] has recommended MRI of brain to evaluate worsening headaches.  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  He has a history of a lumbar spinal fusion at L5-S1 related to a back injury in 1986 for which Workmen's Compensation is involved.  His current spinal cord stimulator is not MRI compatible.  If patient needs to have brain MRI performed, he will need to have his internal implanted generator replaced with one that is MRI compatible.  Patient states that he also has a history of coronary artery disease status post balloon angioplasty many years ago.  Of note patient had an intrathecal pain pump many years ago however this was replaced for his spinal cord stimulator.  Catheter is still in place however the pump is removed.  In regards to his neuropathic pain, patient states that he has exhausted various membrane stabilizers including gabapentin, Lyrica, Cymbalta, alpha lipoic acid.  Discussion had with the patient regarding preventative treatment for migraines. These preventative treatments are indicated when the patient has frequent or long lasting migraine headaches, migraine attacks or cause significant disability or diminished quality of life despite appropriate acute treatment, contraindication to acute therapies, failure of acute therapies, serious adverse effects of acute therapies, risk of medication overuse headache and menstrual migraine.  The main goals of preventative therapy as discussed with the patient include reduce attack frequency, severity, duration. Improve responsiveness to treatment of acute attacks, improve function and reduce disability, prevent progression of episodic migraine to chronic migraine.   Greater than 15  headache days (if yes, how many): Yes, 28  Headaches last greater than 4 hours?: YES  With half of those headaches having migrainous criteria? YES  Headache descriptors: Pulsating, unilateral, photophobia, phonophobia, nausea  Pain intensity (associated with headaches): severe   Medication overuse has been ruled out as a cause of the patient's chronic migraines.  Medications tried (please include dose, duration of therapy):  Antiepileptics: Gabapentin (not effective)  Beta blockers: Propanolol 40 mg twice daily (not effective  Calcium channel blockers: Verapamil (does not recall dose but not effective)  Antidepressants: Amitriptyline 20 mg qhs (not effective)  Patient is also on a monthly injectable, Aimovig which does not provide as much benefit in terms of his migraines as the patient was hoping.      Meds   Current Outpatient Medications:  .  amitriptyline (ELAVIL) 10 MG tablet, Take 20 mg by mouth at bedtime. , Disp: , Rfl:  .  anastrozole (ARIMIDEX) 1 MG tablet, Take 0.5 mg by mouth 2 (two) times a week. , Disp: , Rfl:  .  butalbital-aspirin-caffeine (FIORINAL) 50-325-40 MG tablet, Take 1 tablet by mouth as needed for headache. , Disp: , Rfl:  .  chlorthalidone (HYGROTON) 25 MG tablet, Take 1 tablet (25 mg total) by mouth daily., Disp: 90 tablet, Rfl: 3 .  Cyanocobalamin (VITAMIN B-12) 5000 MCG LOZG, Take 1 lozenge by mouth daily. , Disp: , Rfl:  .  Digestive Aids Mixture (DIGESTION GB PO), Take 1 capsule by mouth daily. , Disp: , Rfl:  .  escitalopram (LEXAPRO) 20 MG tablet, Take 20 mg by mouth daily. , Disp: , Rfl:  .  ezetimibe (ZETIA) 10 MG  tablet, Take 10 mg by mouth daily. , Disp: , Rfl:  .  GLUTATHIONE PO, Take 1 tablet by mouth daily. , Disp: , Rfl:  .  Hyoscyamine Sulfate (HYOSCYAMINE PO), 1 tablet as needed. Every 5 minutes as needed, Disp: , Rfl:  .  Loperamide HCl (IMODIUM PO), Take by mouth as needed. , Disp: , Rfl:  .  LORazepam (ATIVAN) 1 MG tablet, Take 1  mg by mouth 2 (two) times daily. , Disp: , Rfl:  .  MAGNESIUM BISGLYCINATE PO, Take 400 mg by mouth at bedtime. , Disp: , Rfl:  .  Melatonin 10 MG TABS, Take 10 mg by mouth at bedtime. , Disp: , Rfl:  .  Misc Natural Products (CORTISOLV PO), Take by mouth 2 (two) times daily. , Disp: , Rfl:  .  Multiple Vitamins-Minerals (VITAMIN D3 COMPLETE PO), Take 2,000 Units by mouth daily. , Disp: , Rfl:  .  NATTOKINASE PO, Take 200 mg by mouth daily., Disp: , Rfl:  .  ondansetron (ZOFRAN) 4 MG tablet, Take 4 mg by mouth as needed for nausea or vomiting. , Disp: , Rfl:  .  OVER THE COUNTER MEDICATION, Take 1 tablet by mouth in the morning, at noon, and at bedtime. [place] , Disp: , Rfl:  .  OVER THE COUNTER MEDICATION, Take 2 tablets by mouth daily. Candicidal - for yeast, Disp: , Rfl:  .  PAPAYA ENZYME PO, Take by mouth 2 (two) times daily. , Disp: , Rfl:  .  propranolol (INDERAL) 40 MG tablet, Take 40 mg by mouth in the morning and at bedtime., Disp: , Rfl:  .  sucralfate (CARAFATE) 1 g tablet, Take 1 g by mouth 4 (four) times daily as needed. , Disp: , Rfl:  .  Testosterone Cypionate (DEPO-TESTOSTERONE IM), Inject 0.4 mLs into the muscle every 14 (fourteen) days. , Disp: , Rfl:  .  tizanidine (ZANAFLEX) 2 MG capsule, Take 2 mg by mouth every 6 (six) hours as needed. , Disp: , Rfl:  .  Erenumab-aooe (AIMOVIG) 140 MG/ML SOAJ, Inject 140 mg into the skin. Every 4 weeks for Headache., Disp: , Rfl:  .  RABEprazole (ACIPHEX) 20 MG tablet, Take 20 mg by mouth 2 (two) times daily. , Disp: , Rfl:    ROS  Cardiovascular: High blood pressure, Heart attack ( Date: 11-98) and Heart catheterization Pulmonary or Respiratory: Snoring  Neurological: Abnormal skin sensations (Peripheral Neuropathy) Psychological-Psychiatric: Anxiousness and Depressed Gastrointestinal: Reflux or heatburn Genitourinary: Passing kidney stones Hematological: No reported hematological signs or symptoms such as prolonged  bleeding, low or poor functioning platelets, bruising or bleeding easily, hereditary bleeding problems, low energy levels due to low hemoglobin or being anemic Endocrine: No reported endocrine signs or symptoms such as high or low blood sugar, rapid heart rate due to high thyroid levels, obesity or weight gain due to slow thyroid or thyroid disease Rheumatologic: Constant unexplained fatigue (Chronic Fatigue Syndrome) Musculoskeletal: Negative for myasthenia gravis, muscular dystrophy, multiple sclerosis or malignant hyperthermia Work History: Disabled  Allergies  Mr. Weed is allergic to ace inhibitors; fluoxetine; metoclopramide; nalbuphine; other; amoxicillin-pot clavulanate; doxazosin; duloxetine; penicillins; tamsulosin; trazodone and nefazodone; amlodipine; cinoxacin; ciprofloxacin; nebivolol; olanzapine; olmesartan; pregabalin; prostaglandins; thyroid hormones; zolpidem; duloxetine hcl; fluoxetine hcl; gabapentin; and phenytoin.  Laboratory Chemistry Profile   Renal Lab Results  Component Value Date   BUN 14 08/24/2019   CREATININE 0.8 08/24/2019   GFRAA 116 07/12/2019   GFRNONAA 96 07/12/2019     Electrolytes Lab Results  Component  Value Date   NA 144 08/24/2019   K 4.1 08/24/2019   CL 104 08/24/2019   CALCIUM 9.7 08/24/2019     Hepatic Lab Results  Component Value Date   AST 17 08/24/2019   ALT 17 08/24/2019   ALBUMIN 4.2 08/24/2019   ALKPHOS 103 08/24/2019     ID No results found for: LYMEIGGIGMAB, HIV, [place], STAPHAUREUS, MRSAPCR, HCVAB, PREGTESTUR, RMSFIGG, QFVRPH1IGG, QFVRPH2IGG, LYMEIGGIGMAB   Bone No results found for: VD25OH, H139778, G2877219, UV2536UY4, 25OHVITD1, 25OHVITD2, 25OHVITD3, TESTOFREE, TESTOSTERONE   Endocrine Lab Results  Component Value Date   HGBA1C 5.1 10/17/2019   TSH 2.40 10/17/2019     Neuropathy Lab Results  Component Value Date   VITAMINB12 1,436 10/17/2019   HGBA1C 5.1 10/17/2019     CNS No results found for:  COLORCSF, APPEARCSF, RBCCOUNTCSF, WBCCSF, POLYSCSF, LYMPHSCSF, EOSCSF, PROTEINCSF, GLUCCSF, JCVIRUS, CSFOLI, IGGCSF, LABACHR, ACETBL, LABACHR, ACETBL   Inflammation (CRP: Acute  ESR: Chronic) No results found for: CRP, ESRSEDRATE, LATICACIDVEN   Rheumatology No results found for: RF, ANA, LABURIC, URICUR, LYMEIGGIGMAB, LYMEABIGMQN, HLAB27   Coagulation Lab Results  Component Value Date   PLT 202 08/24/2019     Cardiovascular Lab Results  Component Value Date   HGB 15.5 08/24/2019   HCT 48 08/24/2019     Screening No results found for: SARSCOV2NAA, COVIDSOURCE, STAPHAUREUS, MRSAPCR, HCVAB, HIV, PREGTESTUR   Cancer No results found for: CEA, CA125, LABCA2   Allergens No results found for: ALMOND, APPLE, ASPARAGUS, AVOCADO, BANANA, BARLEY, BASIL, [place], GREENBEAN, LIMABEAN, WHITEBEAN, BEEFIGE, REDBEET, BLUEBERRY, BROCCOLI, CABBAGE, MELON, CARROT, CASEIN, CASHEWNUT, CAULIFLOWER, CELERY     Note: Lab results reviewed.   PFSH  Drug: Mr. Maglione no previous drug use Alcohol:  reports current alcohol use. Tobacco:  reports that he has never smoked. He has never used smokeless tobacco. Medical:  has a past medical history of Anxiety, Chronic back pain, Chronic heart disease, Depression, Headache, History of CT scan of brain (09/14/2018), History of depression, History of gastritis, History of headache, History of kidney stones, History of neuropathy, Hypertension, Insomnia, Lumbar radicular pain, Lyme disease, Sleep trouble, and Substance abuse ([place]). Family: family history includes Anxiety disorder in his daughter; Dementia in his mother; Heart disease in his father; Heart failure in his father; Hypertension in his father; Kidney Stones in his daughter; OCD in his daughter; Stroke in his father and mother.  Past Surgical History:  Procedure Laterality Date  . BACK SURGERY    . CHOLECYSTECTOMY  2017  . COLONOSCOPY  09/15/2015   Per New Patient Packet  . GALLBLADDER SURGERY   09/15/2015   Gallbladder Removal. Procedure done by Dr.Beverly. Per New Patient Packet  . KIDNEY STONE SURGERY  09/14/1980   Too many to count. Per New Patient Packet 09/14/1980-09/15/1995  . KIDNEY STONE SURGERY  09/15/1995   Too many to count. Per New Patient Packet  . LITHOTRIPSY  09/14/2014   Per New Patient Packet  . LITHOTRIPSY  09/14/1996   No Stints Used. Per New Patient Packet  . LITHOTRIPSY  09/14/1997   No Stints used. Per New Patient Packet  . PAIN PUMP IMPLANTATION    . PAIN PUMP REMOVAL    . SHOULDER SURGERY Right 1984  . SIGMOIDOSCOPY  09/14/2017   Per New Patient Packet  . SPINAL CORD STIMULATOR IMPLANT    . TONSILLECTOMY     Active Ambulatory Problems    Diagnosis Date Noted  . Spinal cord stimulator status [employer]) 12/19/2019  . Neuropathic pain 12/19/2019  .  History of lumbar fusion 12/19/2019  . Lumbar spondylosis 12/19/2019  . 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 Chronic pain syndrome 12/19/2019  . Muscle pain, myofascial 12/19/2019   Resolved Ambulatory Problems    Diagnosis Date Noted  . No Resolved Ambulatory Problems   Past Medical History:  Diagnosis Date  . Anxiety   . Chronic back pain   . Chronic heart disease   . Depression   . Headache   . History of CT scan of brain 09/14/2018  . History of depression   . History of gastritis   . History of headache   . History of kidney stones   . History of neuropathy   . Hypertension   . Insomnia   . Lumbar radicular pain   . Lyme disease   . Sleep trouble   . Substance abuse ([place])    Constitutional Exam  General appearance: Well nourished, well developed, and well hydrated. In no apparent acute distress Vitals:   12/19/19 1021  BP: (!) 160/91  Pulse: 63  Resp: 16  SpO2: 100%  Weight: 143 lb (64.9 kg)  Height: '5\' 9"'  (1.753 m)   BMI Assessment: Estimated body mass index is 21.12 kg/m as calculated from the following:   Height  as of this encounter: '5\' 9"'  (1.753 m).   Weight as of this encounter: 143 lb (64.9 kg).  BMI interpretation table: BMI level Category Range association with higher incidence of chronic pain  <18 kg/m2 Underweight   18.5-24.9 kg/m2 Ideal body weight   25-29.9 kg/m2 Overweight Increased incidence by 20%  30-34.9 kg/m2 Obese (Class I) Increased incidence by 68%  35-39.9 kg/m2 Severe obesity (Class II) Increased incidence by 136%  >40 kg/m2 Extreme obesity (Class III) Increased incidence by 254%   Patient's current BMI Ideal Body weight  Body mass index is 21.12 kg/m. Ideal body weight: 70.7 kg (155 lb 13.8 oz)   BMI Readings from Last 4 Encounters:  12/19/19 21.12 kg/m  12/18/19 21.12 kg/m  11/17/19 21.41 kg/m  11/16/19 21.56 kg/m   Wt Readings from Last 4 Encounters:  12/19/19 143 lb (64.9 kg)  12/18/19 143 lb (64.9 kg)  11/17/19 145 lb (65.8 kg)  11/16/19 146 lb (66.2 kg)    Psych/Mental status: Alert, oriented x 3 (person, place, & time)       Eyes: PERLA Respiratory: No evidence of acute respiratory distress  Cervical Spine Exam  Skin & Axial Inspection: No masses, redness, edema, swelling, or associated skin lesions Alignment: Symmetrical Functional ROM: Unrestricted ROM      Stability: No instability detected Muscle Tone/Strength: Functionally intact. No obvious neuro-muscular anomalies detected. Sensory (Neurological): Unimpaired Palpation: No palpable anomalies              Upper Extremity (UE) Exam    Side: Right upper extremity  Side: Left upper extremity  Skin & Extremity Inspection: Skin color, temperature, and hair growth are WNL. No peripheral edema or cyanosis. No masses, redness, swelling, asymmetry, or associated skin lesions. No contractures.  Skin & Extremity Inspection: Skin color, temperature, and hair growth are WNL. No peripheral edema or cyanosis. No masses, redness, swelling, asymmetry, or associated skin lesions. No contractures.  Functional  ROM: Unrestricted ROM          Functional ROM: Unrestricted ROM          Muscle Tone/Strength: Functionally intact. No obvious neuro-muscular anomalies detected.  Muscle Tone/Strength: Functionally intact. No obvious neuro-muscular anomalies detected.  Sensory (Neurological): Unimpaired          Sensory (Neurological): Unimpaired          Palpation: No palpable anomalies              Palpation: No palpable anomalies              Provocative Test(s):  Phalen's test: deferred Tinel's test: deferred Apley's scratch test (touch opposite shoulder):  Action 1 (Across chest): deferred Action 2 (Overhead): deferred Action 3 (LB reach): deferred   Provocative Test(s):  Phalen's test: deferred Tinel's test: deferred Apley's scratch test (touch opposite shoulder):  Action 1 (Across chest): deferred Action 2 (Overhead): deferred Action 3 (LB reach): deferred    Thoracic Spine Area Exam  Skin & Axial Inspection: No masses, redness, or swelling Alignment: Symmetrical Functional ROM: Unrestricted ROM Stability: No instability detected Muscle Tone/Strength: Functionally intact. No obvious neuro-muscular anomalies detected. Sensory (Neurological): Unimpaired Muscle strength & Tone: No palpable anomalies  Lumbar Exam  Skin & Axial Inspection: Well healed scar from previous spine surgery detected Alignment: Symmetrical Functional ROM: Unrestricted ROM       Stability: No instability detected Muscle Tone/Strength: Functionally intact. No obvious neuro-muscular anomalies detected. Sensory (Neurological): Neuropathic pain pattern  *(Flexion, ABduction and External Rotation)  Gait & Posture Assessment  Ambulation: Unassisted Gait: Relatively normal for age and body habitus Posture: WNL   Lower Extremity Exam    Side: Right lower extremity  Side: Left lower extremity  Stability: No instability observed          Stability: No instability observed          Skin & Extremity Inspection: Skin  color, temperature, and hair growth are WNL. No peripheral edema or cyanosis. No masses, redness, swelling, asymmetry, or associated skin lesions. No contractures.  Skin & Extremity Inspection: Skin color, temperature, and hair growth are WNL. No peripheral edema or cyanosis. No masses, redness, swelling, asymmetry, or associated skin lesions. No contractures.  Functional ROM: Unrestricted ROM                  Functional ROM: Unrestricted ROM                  Muscle Tone/Strength: Functionally intact. No obvious neuro-muscular anomalies detected.  Muscle Tone/Strength: Functionally intact. No obvious neuro-muscular anomalies detected.  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  Visit Diagnosis (New problems to examiner): 1. 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 Neuropathic pain   3. History of lumbar fusion   4. Lumbar spondylosis   5. Muscle pain, myofascial   6. 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 Chronic pain syndrome    Plan of Care (Initial workup plan)   74 year old male with a history of L5-S1 lumbar spinal fusion, status post [employer] spinal cord stimulator in place with a history of chronic migraines  that have been refractory to conservative  treatment.  Patient's neurologist has recommended brain MRI for work-up of his persistent and worsening headaches.  In order to do this safely, patient will need to have his IPG replaced to a neuro one that is MRI compatible.  We will place a referral to Dr. Lacinda Axon with neurosurgery for this.  For the patient's migraine management, we discussed Botox treatment.  This is FDA approved for migraine management.  Plan for Botox therapy.  Patient also endorses a sore and painful region in his lower lumbar spine.  This is above his spinal cord stimulator anchor.  It is painful to touch.  We discussed performing a trigger point injection at this region.  Plan: -Referral to neurosurgery to discuss changing [employer] IPG to one that is MRI compatible -Botox treatment for migraine management -Plan for lumbar trigger point injection   Referral Orders     Ambulatory referral to Neurosurgery  Procedure Orders     TRIGGER POINT INJECTION   Orders Placed This Encounter  Procedures  . TRIGGER POINT INJECTION    Standing Status:   Future    Standing Expiration Date:   01/18/2020    Scheduling Instructions:     Low lumber spine    Order Specific Question:   Where will this procedure be performed?    Answer:   [facility]  . Ambulatory referral to Neurosurgery    Referral Priority:   Routine    Referral Type:   Surgical    Referral Reason:   Specialty Services Required    Referred to Provider:   Deetta Perla, MD    Requested Specialty:   Neurosurgery    Number of Visits Requested:   1  . Chemo Denervation    Standing Status:   Future    Standing Expiration Date:   12/24/2020    Scheduling Instructions:     Botox for migraines     Provider-requested follow-up: Return in about 2 weeks (around 01/02/2020) for TPI under fluoro.  Future Appointments  Date Time Provider [place]  01/01/2020 10:45 AM [name], MD [facility] None  03/21/2020 10:30 AM [name], NP  [facility] None  03/22/2020  1:20 PM Kate Sable, MD [facility]  12/24/2020  2:15 PM Dewaine Oats Carlos American, NP [facility] None    Note by: [name], MD Date: 12/19/2019; Time: 12:52 PM

## 2019-12-18 NOTE — Progress Notes (Signed)
Cardiology Office Note:    Date:  12/18/2019   ID:  Grant Young, DOB January 07, 1946, MRN [id number]  PCP:  [name], NP  Cardiologist:  Kate Sable, MD  Electrophysiologist:  None   Referring MD: [name], NP   Chief Complaint  Patient presents with  . OTHER    2 wk f/u echo no complaints today. Meds reviewed verbally with pt.    History of Present Illness:    Grant Young is a 74 y.o. male with a hx of anxiety, hypertension, CAD/MI in 1998 who presents for follow-up.  He was last seen due to uncontrolled blood pressure.  Patient has had uncontrolled blood pressure for years.  He has had a lot of side effects from prior blood pressure medications.  Patient otherwise denies chest pain states having worsening dyspnea on exertion when he gets on his treadmill.  This has been going on for several weeks now.  Last Carlton Adam was seen 2017 which was normal.  His blood pressure has improved since starting chlorthalidone.  Historical notes Upon chart review chart, patient not tolerant to a lot of BP meds as stated below.  "Could not tolerate hydralazine due to "strong pulse" felt his HR in his neck and chest and could not tolerate that. He could not tolerate losartan due to abdominal pain, strong pulse, headache, benicar gave him a lot of side effects, he was on for 8-12 weeks.  Clonidine 0.1 mg- extreme dry mouth and no better blood pressure. Amlodipine caused shaking. Enalapril worked but caused a cough"  Upon further discussion with patient into specifics of adverse effects of blood pressure he tried Benicar but did not see any improvement in side effects and stopped.  He also tried chlorthalidone in the past due to history of kidney stones without any side effects.  He was recently started on propanolol which he currently takes due to history of tremors and headaches.  He has a history of CAD/MI in [place] back in 1998.  He underwent balloon angioplasty  without any stenting because lesion was at the bifurcation.  The following year in 1999, he had another MI and underwent another balloon angioplasty.  He states feeling well since then.   Past Medical History:  Diagnosis Date  . Anxiety    Per [facility] Patient Packet  . Chronic back pain    Per [facility]  . Chronic heart disease    Per [facility] Patient [facility]  . Depression   . Headache   . History of CT scan of brain 09/14/2018   Per [facility] Patient [facility]  . History of depression    Per [facility] Patient [facility]  . History of gastritis    Per [facility] Patient [facility]  . History of headache    Per [facility] Patient [facility]  . History of kidney stones    Per [facility] Patient [facility]  . History of neuropathy    Per New Patient Packet  . Hypertension    Per New Patient Packet  . Insomnia   . Lumbar radicular pain    Per New Patient Packet  . Lyme disease    Per [facility] Patient Packet  . Sleep trouble    Per New Patient Packet  . Substance abuse [facility])     Past Surgical History:  Procedure Laterality Date  . BACK SURGERY    . CHOLECYSTECTOMY  2017  . COLONOSCOPY  09/15/2015   Per New Patient Packet  . GALLBLADDER SURGERY  09/15/2015   Gallbladder Removal. Procedure done by Dr.Beverly. Per New Patient Packet  . KIDNEY STONE SURGERY  09/14/1980   Too many to count. Per New Patient Packet 09/14/1980-09/15/1995  . KIDNEY STONE SURGERY  09/15/1995   Too many to count. Per New Patient Packet  . LITHOTRIPSY  09/14/2014   Per New Patient Packet  . LITHOTRIPSY  09/14/1996   No Stints Used. Per New Patient Packet  . LITHOTRIPSY  09/14/1997   No Stints used. Per New Patient Packet  . PAIN PUMP IMPLANTATION    . PAIN PUMP REMOVAL    . SHOULDER SURGERY Right 1984  . SIGMOIDOSCOPY  09/14/2017   Per New Patient Packet  . SPINAL CORD STIMULATOR IMPLANT    . TONSILLECTOMY      Current Medications: Current Meds  Medication Sig  . amitriptyline (ELAVIL) 10 MG tablet Take 20 mg by mouth at bedtime.   Marland Kitchen  anastrozole (ARIMIDEX) 1 MG tablet Take 0.5 mg by mouth 2 (two) times a week.   . butalbital-aspirin-caffeine (FIORINAL) 50-325-40 MG tablet Take 1 tablet by mouth as needed for headache.   . chlorthalidone (HYGROTON) 25 MG tablet Take 1 tablet (25 mg total) by mouth daily.  . Cyanocobalamin (VITAMIN B-12) 5000 MCG LOZG Take 1 lozenge by mouth daily.   . Digestive Aids Mixture (DIGESTION GB PO) Take 1 capsule by mouth daily.   Marland Kitchen escitalopram (LEXAPRO) 20 MG tablet Take 20 mg by mouth daily.   Marland Kitchen ezetimibe (ZETIA) 10 MG tablet Take 10 mg by mouth daily.   Marland Kitchen GLUTATHIONE PO Take 1 tablet by mouth daily.   Marland Kitchen Hyoscyamine Sulfate (HYOSCYAMINE PO) 1 tablet as needed. Every 5 minutes as needed  . Loperamide HCl (IMODIUM PO) Take by mouth as needed.   Marland Kitchen LORazepam (ATIVAN) 1 MG tablet Take 1 mg by mouth 2 (two) times daily.   Marland Kitchen MAGNESIUM BISGLYCINATE PO Take 400 mg by mouth at bedtime.   . Melatonin 10 MG TABS Take 10 mg by mouth at bedtime.   . Misc Natural Products (CORTISOLV PO) Take by mouth 2 (two) times daily.   . Multiple Vitamins-Minerals (VITAMIN D3 COMPLETE PO) Take 2,000 Units by mouth daily.   Marland Kitchen NATTOKINASE PO Take 200 mg by mouth daily.  . ondansetron (ZOFRAN) 4 MG tablet Take 4 mg by mouth as needed for nausea or vomiting.   Marland Kitchen OVER THE COUNTER MEDICATION Take 1 tablet by mouth in the morning, at noon, and at bedtime. [place] Supreme   . OVER THE COUNTER MEDICATION Take 2 tablets by mouth daily. Candicidal - for yeast  . PAPAYA ENZYME PO Take by mouth 2 (two) times daily.   . propranolol (INDERAL) 40 MG tablet Take 40 mg by mouth in the morning and at bedtime.  . RABEprazole (ACIPHEX) 20 MG tablet Take 20 mg by mouth 2 (two) times daily.   . sucralfate (CARAFATE) 1 g tablet Take 1 g by mouth 4 (four) times daily as needed.   . Testosterone Cypionate (DEPO-TESTOSTERONE IM) Inject 0.4 mLs into the muscle every 14 (fourteen) days.   . tizanidine (ZANAFLEX) 2 MG capsule Take 2 mg by mouth  every 6 (six) hours as needed.      Allergies:   Ace inhibitors, Fluoxetine, Metoclopramide, Nalbuphine, Other, Amoxicillin-pot clavulanate, Doxazosin, Duloxetine, Penicillins, Tamsulosin, Trazodone and nefazodone, Amlodipine, Cinoxacin, Ciprofloxacin, Nebivolol, Olanzapine, Olmesartan, Pregabalin, Prostaglandins, Thyroid hormones, Zolpidem, Duloxetine hcl, Fluoxetine hcl, Gabapentin, and Phenytoin   Social History   Socioeconomic History  . Marital  status: Married    Spouse name: Not on file  . Number of children: Not on file  . Years of education: Not on file  . Highest education level: Not on file  Occupational History  . Not on file  Tobacco Use  . Smoking status: Never Smoker  . Smokeless tobacco: Never Used  Substance and Sexual Activity  . Alcohol use: Yes    Comment: 1 Drink a Month.  . Drug use: Not Currently  . Sexual activity: Not on file  Other Topics Concern  . Not on file  Social History Narrative   Tobacco use, amount per day now: None   Past tobacco use, amount per day: None   How many years did you use tobacco: 0   Alcohol use (drinks per week): 0-1 Month   Diet:   Do you drink/eat things with caffeine: Occasionally ( Hot Chocolate and maybe 1/4 of 16oz Pepsi 2-3 times a week.   Marital status: Married                                  What year were you married? 1970   Do you live in a house, apartment, assisted living, condo, trailer, etc.? House   Is it one or more stories? One   How many persons live in your home? 2   Do you have pets in your home?( please list)  No   Highest Level of education completed: Masters   Current or past profession: Intensive Transport planner for [employer]   Do you exercise? Yes                                    Type and how often? [place], Recumbent Bike, 3 times a week. Try to get a 1.2 mile walk at least 3 times a week or more.     Do you have a living will? Yes   Do you have a DNR form?  No                                  If not, do you want to discuss one?   Do you have signed POA/HPOA forms? Yes                       If so, please bring to you appointment    Do you have any difficulty bathing or dressing yourself? No    Do you have difficulty preparing food or eating? No   Do you have difficulty managing your medications? No   Do you have any difficulty managing your finances? No   Do you have any difficulty affording your medications? No         Social Determinants of Health   Financial Resource Strain:   . Difficulty of Paying Living Expenses:   Food Insecurity:   . Worried About Charity fundraiser in the Last Year:   . Arboriculturist in the Last Year:   Transportation Needs:   . Film/video editor (Medical):   Marland Kitchen Lack of Transportation (Non-Medical):   Physical Activity:   . Days of Exercise per Week:   . Minutes of Exercise per Session:   Stress:   . Feeling of  Stress :   Social Connections:   . Frequency of Communication with Friends and Family:   . Frequency of Social Gatherings with Friends and Family:   . Attends Religious Services:   . Active Member of Clubs or Organizations:   . Attends Archivist Meetings:   Marland Kitchen Marital Status:      Family History: The patient's family history includes Anxiety disorder in his daughter; Dementia in his mother; Heart disease in his father; Heart failure in his father; Hypertension in his father; Kidney Stones in his daughter; OCD in his daughter; Stroke in his father and mother.  ROS:   Please see the history of present illness.     All other systems reviewed and are negative.  EKGs/Labs/Other Studies Reviewed:    The following studies were reviewed today:   EKG:  EKG is  ordered today.  The ekg ordered today demonstrates sinus rhythm, occasional PVCs.  Recent Labs: 08/24/2019: ALT 17; BUN 14; Creatinine 0.8; Hemoglobin 15.5; Platelets 202; Potassium 4.1; Sodium 144 10/17/2019: TSH 2.40  Recent Lipid Panel      Component Value Date/Time   CHOL 168 08/24/2019 0000   TRIG 121 08/24/2019 0000   HDL 48 08/24/2019 0000   LDLCALC 96 08/24/2019 0000    Physical Exam:    VS:  BP (!) 158/98 (BP Location: Left Arm, Patient Position: Sitting, Cuff Size: Normal)   Pulse 65   Ht 5\' 9"  (1.753 m)   Wt 143 lb (64.9 kg)   SpO2 98%   BMI 21.12 kg/m     Wt Readings from Last 3 Encounters:  12/18/19 143 lb (64.9 kg)  11/17/19 145 lb (65.8 kg)  11/16/19 146 lb (66.2 kg)     GEN:  Well nourished, well developed in no acute distress HEENT: Normal NECK: No JVD; No carotid bruits LYMPHATICS: No lymphadenopathy CARDIAC: RRR, no murmurs, rubs, gallops RESPIRATORY:  Clear to auscultation without rales, wheezing or rhonchi  ABDOMEN: Soft, non-tender, non-distended MUSCULOSKELETAL:  No edema; No deformity  SKIN: Warm and dry NEUROLOGIC:  Alert and oriented x 3 PSYCHIATRIC:  Normal affect   ASSESSMENT:    1. Essential hypertension   2. Coronary artery disease involving native coronary artery of native heart without angina pectoris   3. Dyspnea on exertion    PLAN:    In order of problems listed above:  1. Patient with history of hypertension.  Blood pressure is improved but still elevated.  Marland Kitchen  Has lots of side effects from blood pressure meds in different classes.  Continue chlorthalidone 25 mg daily.  Recommended starting Benicar 20 mg daily today, but patient declined.  Patient will like to wait and see if the effects of chlorthalidone continue to control blood pressure. 2. Has history of CAD status status post balloon angioplasty.  Get echocardiogram to evaluate ejection fraction.  He is asymptomatic.  Aspirin was recommended but patient states having GI upset with aspirin. 3. Patient states having dyspnea on exertion over the past couple of weeks.  He has risk factors of previous MI, hypertension.  Last Lexiscan was normal.  Recommended coronary CTA.  Patient declined testing.  States he would like to  wait and see if his symptoms improve.  Follow-up in 3 months.  Total encounter time 45 minutes  Greater than 50% was spent in counseling and coordination of care with the patient. Total time spent going through patient history, discussing findings of prior echocardiogram report, and side effects observed by patient, discussing side  effects of different blood pressure medication classes, explaining the process of coronary CTA, indications for coronary CTA, possible outcomes with findings.   This note was generated in part or whole with voice recognition software. Voice recognition is usually quite accurate but there are transcription errors that can and very often do occur. I apologize for any typographical errors that were not detected and corrected.  Medication Adjustments/Labs and Tests Ordered: Current medicines are reviewed at length with the patient today.  Concerns regarding medicines are outlined above.  Orders Placed This Encounter  Procedures  . EKG 12-Lead   No orders of the defined types were placed in this encounter.   Patient Instructions  Medication Instructions:  - Your physician recommends that you continue on your current medications as directed. Please refer to the Current Medication list given to you today.  *If you need a refill on your cardiac medications before your next appointment, please call your pharmacy*   Lab Work: - none ordered  If you have labs (blood work) drawn today and your tests are completely normal, you will receive your results only by: Marland Kitchen MyChart Message (if you have MyChart) OR . A paper copy in the mail If you have any lab test that is abnormal or we need to change your treatment, we will call you to review the results.   Testing/Procedures: - none ordered  - below is information about a Cardiac CTA for your review   Follow-Up: At [facility], you and your health needs are our priority.  As part of our continuing mission to  provide you with exceptional heart care, we have created designated Provider Care Teams.  These Care Teams include your primary Cardiologist (physician) and Advanced Practice Providers (APPs -  Physician Assistants and Nurse Practitioners) who all work together to provide you with the care you need, when you need it.  We recommend signing up for the patient portal called "MyChart".  Sign up information is provided on this After Visit Summary.  MyChart is used to connect with patients for Virtual Visits (Telemedicine).  Patients are able to view lab/test results, encounter notes, upcoming appointments, etc.  Non-urgent messages can be sent to your provider as well.   To learn more about what you can do with MyChart, go to [email or web address].    Your next appointment:   3 month(s)  The format for your next appointment:   In Person  Provider:   Kate Sable, MD   Other Instructions N/a   Cardiac CT Angiogram A cardiac CT angiogram is a procedure to look at the heart and the area around the heart. It may be done to help find the cause of chest pains or other symptoms of heart disease. During this procedure, a substance called contrast dye is injected into the blood vessels in the area to be checked. A large X-ray machine, called a CT scanner, then takes detailed pictures of the heart and the surrounding area. The procedure is also sometimes called a coronary CT angiogram, coronary artery scanning, or CTA. A cardiac CT angiogram allows the health care provider to see how well blood is flowing to and from the heart. The health care provider will be able to see if there are any problems, such as:  Blockage or narrowing of the coronary arteries in the heart.  Fluid around the heart.  Signs of weakness or disease in the muscles, valves, and tissues of the heart. Tell a health care provider about:  Any allergies you  have. This is especially important if you have had a previous allergic  reaction to contrast dye.  All medicines you are taking, including vitamins, herbs, eye drops, creams, and over-the-counter medicines.  Any blood disorders you have.  Any surgeries you have had.  Any medical conditions you have.  Whether you are pregnant or may be pregnant.  Any anxiety disorders, chronic pain, or other conditions you have that may increase your stress or prevent you from lying still. What are the risks? Generally, this is a safe procedure. However, problems may occur, including:  Bleeding.  Infection.  Allergic reactions to medicines or dyes.  Damage to other structures or organs.  Kidney damage from the contrast dye that is used.  Increased risk of cancer from radiation exposure. This risk is low. Talk with your health care provider about: ? The risks and benefits of testing. ? How you can receive the lowest dose of radiation. What happens before the procedure?  Wear comfortable clothing and remove any jewelry, glasses, dentures, and hearing aids.  Follow instructions from your health care provider about eating and drinking. This may include: ? For 12 hours before the procedure -- avoid caffeine. This includes tea, coffee, soda, energy drinks, and diet pills. Drink plenty of water or other fluids that do not have caffeine in them. Being well hydrated can prevent complications. ? For 4-6 hours before the procedure -- stop eating and drinking. The contrast dye can cause nausea, but this is less likely if your stomach is empty.  Ask your health care provider about changing or stopping your regular medicines. This is especially important if you are taking diabetes medicines, blood thinners, or medicines to treat problems with erections (erectile dysfunction). What happens during the procedure?   Hair on your chest may need to be removed so that small sticky patches called electrodes can be placed on your chest. These will transmit information that helps to  monitor your heart during the procedure.  An IV will be inserted into one of your veins.  You might be given a medicine to control your heart rate during the procedure. This will help to ensure that good images are obtained.  You will be asked to lie on an exam table. This table will slide in and out of the CT machine during the procedure.  Contrast dye will be injected into the IV. You might feel warm, or you may get a metallic taste in your mouth.  You will be given a medicine called nitroglycerin. This will relax or dilate the arteries in your heart.  The table that you are lying on will move into the CT machine tunnel for the scan.  The person running the machine will give you instructions while the scans are being done. You may be asked to: ? Keep your arms above your head. ? Hold your breath. ? Stay very still, even if the table is moving.  When the scanning is complete, you will be moved out of the machine.  The IV will be removed. The procedure may vary among health care providers and hospitals. What can I expect after the procedure? After your procedure, it is common to have:  A metallic taste in your mouth from the contrast dye.  A feeling of warmth.  A headache from the nitroglycerin. Follow these instructions at home:  Take over-the-counter and prescription medicines only as told by your health care provider.  If you are told, drink enough fluid to keep your urine pale  yellow. This will help to flush the contrast dye out of your body.  Most people can return to their normal activities right after the procedure. Ask your health care provider what activities are safe for you.  It is up to you to get the results of your procedure. Ask your health care provider, or the department that is doing the procedure, when your results will be ready.  Keep all follow-up visits as told by your health care provider. This is important. Contact a health care provider if:  You  have any symptoms of allergy to the contrast dye. These include: ? Shortness of breath. ? Rash or hives. ? A racing heartbeat. Summary  A cardiac CT angiogram is a procedure to look at the heart and the area around the heart. It may be done to help find the cause of chest pains or other symptoms of heart disease.  During this procedure, a large X-ray machine, called a CT scanner, takes detailed pictures of the heart and the surrounding area after a contrast dye has been injected into blood vessels in the area.  Ask your health care provider about changing or stopping your regular medicines before the procedure. This is especially important if you are taking diabetes medicines, blood thinners, or medicines to treat erectile dysfunction.  If you are told, drink enough fluid to keep your urine pale yellow. This will help to flush the contrast dye out of your body. This information is not intended to replace advice given to you by your health care provider. Make sure you discuss any questions you have with your health care provider. Document Revised: 04/26/2019 Document Reviewed: 04/26/2019 Elsevier Patient Education  2020 [place], Kate Sable, MD  12/18/2019 1:09 PM    [place]

## 2019-12-18 NOTE — Patient Instructions (Addendum)
Medication Instructions:  - Your physician recommends that you continue on your current medications as directed. Please refer to the Current Medication list given to you today.  *If you need a refill on your cardiac medications before your next appointment, please call your pharmacy*   Lab Work: - none ordered  If you have labs (blood work) drawn today and your tests are completely normal, you will receive your results only by: Marland Kitchen MyChart Message (if you have MyChart) OR . A paper copy in the mail If you have any lab test that is abnormal or we need to change your treatment, we will call you to review the results.   Testing/Procedures: - none ordered  - below is information about a Cardiac CTA for your review   Follow-Up: At [facility], you and your health needs are our priority.  As part of our continuing mission to provide you with exceptional heart care, we have created designated Provider Care Teams.  These Care Teams include your primary Cardiologist (physician) and Advanced Practice Providers (APPs -  Physician Assistants and Nurse Practitioners) who all work together to provide you with the care you need, when you need it.  We recommend signing up for the patient portal called "MyChart".  Sign up information is provided on this After Visit Summary.  MyChart is used to connect with patients for Virtual Visits (Telemedicine).  Patients are able to view lab/test results, encounter notes, upcoming appointments, etc.  Non-urgent messages can be sent to your provider as well.   To learn more about what you can do with MyChart, go to [email or web address].    Your next appointment:   3 month(s)  The format for your next appointment:   In Person  Provider:   Kate Sable, MD   Other Instructions N/a   Cardiac CT Angiogram A cardiac CT angiogram is a procedure to look at the heart and the area around the heart. It may be done to help find the cause of chest pains or  other symptoms of heart disease. During this procedure, a substance called contrast dye is injected into the blood vessels in the area to be checked. A large X-ray machine, called a CT scanner, then takes detailed pictures of the heart and the surrounding area. The procedure is also sometimes called a coronary CT angiogram, coronary artery scanning, or CTA. A cardiac CT angiogram allows the health care provider to see how well blood is flowing to and from the heart. The health care provider will be able to see if there are any problems, such as:  Blockage or narrowing of the coronary arteries in the heart.  Fluid around the heart.  Signs of weakness or disease in the muscles, valves, and tissues of the heart. Tell a health care provider about:  Any allergies you have. This is especially important if you have had a previous allergic reaction to contrast dye.  All medicines you are taking, including vitamins, herbs, eye drops, creams, and over-the-counter medicines.  Any blood disorders you have.  Any surgeries you have had.  Any medical conditions you have.  Whether you are pregnant or may be pregnant.  Any anxiety disorders, chronic pain, or other conditions you have that may increase your stress or prevent you from lying still. What are the risks? Generally, this is a safe procedure. However, problems may occur, including:  Bleeding.  Infection.  Allergic reactions to medicines or dyes.  Damage to other structures or organs.  Kidney damage from  the contrast dye that is used.  Increased risk of cancer from radiation exposure. This risk is low. Talk with your health care provider about: ? The risks and benefits of testing. ? How you can receive the lowest dose of radiation. What happens before the procedure?  Wear comfortable clothing and remove any jewelry, glasses, dentures, and hearing aids.  Follow instructions from your health care provider about eating and drinking.  This may include: ? For 12 hours before the procedure - avoid caffeine. This includes tea, coffee, soda, energy drinks, and diet pills. Drink plenty of water or other fluids that do not have caffeine in them. Being well hydrated can prevent complications. ? For 4-6 hours before the procedure - stop eating and drinking. The contrast dye can cause nausea, but this is less likely if your stomach is empty.  Ask your health care provider about changing or stopping your regular medicines. This is especially important if you are taking diabetes medicines, blood thinners, or medicines to treat problems with erections (erectile dysfunction). What happens during the procedure?   Hair on your chest may need to be removed so that small sticky patches called electrodes can be placed on your chest. These will transmit information that helps to monitor your heart during the procedure.  An IV will be inserted into one of your veins.  You might be given a medicine to control your heart rate during the procedure. This will help to ensure that good images are obtained.  You will be asked to lie on an exam table. This table will slide in and out of the CT machine during the procedure.  Contrast dye will be injected into the IV. You might feel warm, or you may get a metallic taste in your mouth.  You will be given a medicine called nitroglycerin. This will relax or dilate the arteries in your heart.  The table that you are lying on will move into the CT machine tunnel for the scan.  The person running the machine will give you instructions while the scans are being done. You may be asked to: ? Keep your arms above your head. ? Hold your breath. ? Stay very still, even if the table is moving.  When the scanning is complete, you will be moved out of the machine.  The IV will be removed. The procedure may vary among health care providers and hospitals. What can I expect after the procedure? After your  procedure, it is common to have:  A metallic taste in your mouth from the contrast dye.  A feeling of warmth.  A headache from the nitroglycerin. Follow these instructions at home:  Take over-the-counter and prescription medicines only as told by your health care provider.  If you are told, drink enough fluid to keep your urine pale yellow. This will help to flush the contrast dye out of your body.  Most people can return to their normal activities right after the procedure. Ask your health care provider what activities are safe for you.  It is up to you to get the results of your procedure. Ask your health care provider, or the department that is doing the procedure, when your results will be ready.  Keep all follow-up visits as told by your health care provider. This is important. Contact a health care provider if:  You have any symptoms of allergy to the contrast dye. These include: ? Shortness of breath. ? Rash or hives. ? A racing heartbeat. Summary  A cardiac CT angiogram is a procedure to look at the heart and the area around the heart. It may be done to help find the cause of chest pains or other symptoms of heart disease.  During this procedure, a large X-ray machine, called a CT scanner, takes detailed pictures of the heart and the surrounding area after a contrast dye has been injected into blood vessels in the area.  Ask your health care provider about changing or stopping your regular medicines before the procedure. This is especially important if you are taking diabetes medicines, blood thinners, or medicines to treat erectile dysfunction.  If you are told, drink enough fluid to keep your urine pale yellow. This will help to flush the contrast dye out of your body. This information is not intended to replace advice given to you by your health care provider. Make sure you discuss any questions you have with your health care provider. Document Revised: 04/26/2019  Document Reviewed: 04/26/2019 Elsevier Patient Education  [place].

## 2019-12-19 ENCOUNTER — Encounter: Payer: Self-pay | Admitting: Nurse Practitioner

## 2019-12-19 ENCOUNTER — Ambulatory Visit
Payer: Worker's Compensation | Attending: Student in an Organized Health Care Education/Training Program | Admitting: Student in an Organized Health Care Education/Training Program

## 2019-12-19 ENCOUNTER — Other Ambulatory Visit: Payer: Self-pay

## 2019-12-19 ENCOUNTER — Encounter: Payer: Self-pay | Admitting: Student in an Organized Health Care Education/Training Program

## 2019-12-19 ENCOUNTER — Ambulatory Visit (INDEPENDENT_AMBULATORY_CARE_PROVIDER_SITE_OTHER): Payer: Medicare Other | Admitting: Nurse Practitioner

## 2019-12-19 VITALS — BP 160/91 | HR 63 | Resp 16 | Ht 69.0 in | Wt 143.0 lb

## 2019-12-19 DIAGNOSIS — Z981 Arthrodesis status: Secondary | ICD-10-CM | POA: Insufficient documentation

## 2019-12-19 DIAGNOSIS — G894 Chronic pain syndrome: Secondary | ICD-10-CM | POA: Diagnosis present

## 2019-12-19 DIAGNOSIS — Z0000 Encounter for general adult medical examination without abnormal findings: Secondary | ICD-10-CM

## 2019-12-19 DIAGNOSIS — G43701 Chronic migraine without aura, not intractable, with status migrainosus: Secondary | ICD-10-CM | POA: Diagnosis present

## 2019-12-19 DIAGNOSIS — M792 Neuralgia and neuritis, unspecified: Secondary | ICD-10-CM | POA: Diagnosis present

## 2019-12-19 DIAGNOSIS — M7918 Myalgia, other site: Secondary | ICD-10-CM | POA: Insufficient documentation

## 2019-12-19 DIAGNOSIS — Z9689 Presence of other specified functional implants: Secondary | ICD-10-CM | POA: Insufficient documentation

## 2019-12-19 DIAGNOSIS — M47816 Spondylosis without myelopathy or radiculopathy, lumbar region: Secondary | ICD-10-CM | POA: Diagnosis not present

## 2019-12-19 NOTE — Patient Instructions (Addendum)
Grant Young , Thank you for taking time to come for your Medicare Wellness Visit. I appreciate your ongoing commitment to your health goals. Please review the following plan we discussed and let me know if I can assist you in the future.   Screening recommendations/referrals: Colonoscopy up to date- following with GI  Recommended yearly ophthalmology/optometry visit for glaucoma screening and checkup Recommended yearly dental visit for hygiene and checkup  Vaccinations: Influenza vaccine: NEXT due in September 2021 Pneumococcal vaccine up to date Tdap vaccine up to date Shingles vaccine up to date    Advanced directives: on file, recommend completion MOST form.   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  Preventive Care 72 Years and Older, Male Preventive care refers to lifestyle choices and visits with your health care provider that can promote health and wellness. What does preventive care include?  A yearly physical exam. This is also called an annual well check.  Dental exams once or twice a year.  Routine eye exams. Ask your health care provider how often you should have your eyes checked.  Personal lifestyle choices, including:  Daily care of your teeth and gums.  Regular physical activity.  Eating a healthy diet.  Avoiding tobacco and drug use.  Limiting alcohol use.  Practicing safe sex.  Taking low doses of aspirin every day.  Taking vitamin and mineral supplements as recommended by your health care provider. What happens during an annual well check? The services and screenings done by your health care provider during your annual well check will depend on your age, overall health, lifestyle risk factors, and family history of disease. Counseling  Your health care provider may ask you questions about your:  Alcohol use.  Tobacco use.  Drug use.  Emotional well-being.  Home  and relationship well-being.  Sexual activity.  Eating habits.  History of falls.  Memory and ability to understand (cognition).  Work and work Statistician. Screening  You may have the following tests or measurements:  Height, weight, and BMI.  Blood pressure.  Lipid and cholesterol levels. These may be checked every 5 years, or more frequently if you are over 72 years old.  Skin check.  Lung cancer screening. You may have this screening every year starting at age 72 if you have a 30-pack-year history of smoking and currently smoke or have quit within the past 15 years.  Fecal occult blood test (FOBT) of the stool. You may have this test every year starting at age 72.  Flexible sigmoidoscopy or colonoscopy. You may have a sigmoidoscopy every 5 years or a colonoscopy every 10 years starting at age 72.  Prostate cancer screening. Recommendations will vary depending on your family history and other risks.  Hepatitis C blood test.  Hepatitis B blood test.  Sexually transmitted disease (STD) testing.  Diabetes screening. This is done by checking your blood sugar (glucose) after you have not eaten for a while (fasting). You may have this done every 1-3 years.  Abdominal aortic aneurysm (AAA) screening. You may need this if you are a current or former smoker.  Osteoporosis. You may be screened starting at age 72 if you are at high risk. if you are at high risk. Talk with your health care provider about your test results, treatment options, and if necessary, the need for more tests. Vaccines  Your health care provider may recommend certain vaccines, such as:  Influenza vaccine. This is recommended every year.  Tetanus, diphtheria, and  acellular pertussis (Tdap, Td) vaccine. You may need a Td booster every 10 years.  Zoster vaccine. You may need this after age 72.  Pneumococcal 13-valent conjugate (PCV13) vaccine. One dose is recommended after age 72.  Pneumococcal polysaccharide (PPSV23) vaccine.  One dose is recommended after age 72. Talk to your health care provider about which screenings and vaccines you need and how often you need them. This information is not intended to replace advice given to you by your health care provider. Make sure you discuss any questions you have with your health care provider. Document Released: 09/27/2015 Document Revised: 05/20/2016 Document Reviewed: 07/02/2015 Elsevier Interactive Patient Education  2017 [place] Prevention in the Home Falls can cause injuries. They can happen to people of all ages. There are many things you can do to make your home safe and to help prevent falls. What can I do on the outside of my home?  Regularly fix the edges of walkways and driveways and fix any cracks.  Remove anything that might make you trip as you walk through a door, such as a raised step or threshold.  Trim any bushes or trees on the path to your home.  Use bright outdoor lighting.  Clear any walking paths of anything that might make someone trip, such as rocks or tools.  Regularly check to see if handrails are loose or broken. Make sure that both sides of any steps have handrails.  Any raised decks and porches should have guardrails on the edges.  Have any leaves, snow, or ice cleared regularly.  Use sand or salt on walking paths during winter.  Clean up any spills in your garage right away. This includes oil or grease spills. What can I do in the bathroom?  Use night lights.  Install grab bars by the toilet and in the tub and shower. Do not use towel bars as grab bars.  Use non-skid mats or decals in the tub or shower.  If you need to sit down in the shower, use a plastic, non-slip stool.  Keep the floor dry. Clean up any water that spills on the floor as soon as it happens.  Remove soap buildup in the tub or shower regularly.  Attach bath mats securely with double-sided non-slip rug tape.  Do not have throw rugs and other  things on the floor that can make you trip. What can I do in the bedroom?  Use night lights.  Make sure that you have a light by your bed that is easy to reach.  Do not use any sheets or blankets that are too big for your bed. They should not hang down onto the floor.  Have a firm chair that has side arms. You can use this for support while you get dressed.  Do not have throw rugs and other things on the floor that can make you trip. What can I do in the kitchen?  Clean up any spills right away.  Avoid walking on wet floors.  Keep items that you use a lot in easy-to-reach places.  If you need to reach something above you, use a strong step stool that has a grab bar.  Keep electrical cords out of the way.  Do not use floor polish or wax that makes floors slippery. If you must use wax, use non-skid floor wax.  Do not have throw rugs and other things on the floor that can make you trip. What can I do with my stairs?  Do not leave any items on the stairs.  Make sure that there are handrails on both sides of the stairs and use them. Fix handrails that are broken or loose. Make sure that handrails are as long as the stairways.  Check any carpeting to make sure that it is firmly attached to the stairs. Fix any carpet that is loose or worn.  Avoid having throw rugs at the top or bottom of the stairs. If you do have throw rugs, attach them to the floor with carpet tape.  Make sure that you have a light switch at the top of the stairs and the bottom of the stairs. If you do not have them, ask someone to add them for you. What else can I do to help prevent falls?  Wear shoes that:  Do not have high heels.  Have rubber bottoms.  Are comfortable and fit you well.  Are closed at the toe. Do not wear sandals.  If you use a stepladder:  Make sure that it is fully opened. Do not climb a closed stepladder.  Make sure that both sides of the stepladder are locked into place.  Ask  someone to hold it for you, if possible.  Clearly mark and make sure that you can see:  Any grab bars or handrails.  First and last steps.  Where the edge of each step is.  Use tools that help you move around (mobility aids) if they are needed. These include:  Canes.  Walkers.  Scooters.  Crutches.  Turn on the lights when you go into a dark area. Replace any light bulbs as soon as they burn out.  Set up your furniture so you have a clear path. Avoid moving your furniture around.  If any of your floors are uneven, fix them.  If there are any pets around you, be aware of where they are.  Review your medicines with your doctor. Some medicines can make you feel dizzy. This can increase your chance of falling. Ask your doctor what other things that you can do to help prevent falls. This information is not intended to replace advice given to you by your health care provider. Make sure you discuss any questions you have with your health care provider. Document Released: 06/27/2009 Document Revised: 02/06/2016 Document Reviewed: 10/05/2014 Elsevier Interactive Patient Education  2017 [employer].

## 2019-12-19 NOTE — Progress Notes (Signed)
    This service is provided via telemedicine  No vital signs collected/recorded due to the encounter was a telemedicine visit.   Location of patient (ex: home, work): Home.  Patient consents to a telephone visit: Yes.  Location of the provider (ex: office, home):  [place].   Name of any referring provider: N/A  Names of all persons participating in the telemedicine service and their role in the encounter: Patient, Grant Young, RMA, Jessica Eubanks, NP.    Time spent on call: 8 minutes spent on the phone with Medical Assistant.   

## 2019-12-19 NOTE — Patient Instructions (Signed)
Pain Management Discharge Instructions  General Discharge Instructions :  If you need to reach your doctor call: Monday-Friday 8:00 am - 4:00 pm at [phone] or toll free [phone].  After clinic hours [phone] to have operator reach doctor.  Bring all of your medication bottles to all your appointments in the pain clinic.  To cancel or reschedule your appointment with Pain Management please remember to call 24 hours in advance to avoid a fee.  Refer to the educational materials which you have been given on: General Risks, I had my Procedure. Discharge Instructions, Post Sedation.  Post Procedure Instructions:  The drugs you were given will stay in your system until tomorrow, so for the next 24 hours you should not drive, make any legal decisions or drink any alcoholic beverages.  You may eat anything you prefer, but it is better to start with liquids then soups and crackers, and gradually work up to solid foods.  Please notify your doctor immediately if you have any unusual bleeding, trouble breathing or pain that is not related to your normal pain.  Depending on the type of procedure that was done, some parts of your body may feel week and/or numb.  This usually clears up by tonight or the next day.  Walk with the use of an assistive device or accompanied by an adult for the 24 hours.  You may use ice on the affected area for the first 24 hours.  Put ice in a Ziploc bag and cover with a towel and place against area 15 minutes on 15 minutes off.  You may switch to heat after 24 hours.GENERAL RISKS AND COMPLICATIONS  What are the risk, side effects and possible complications? Generally speaking, most procedures are safe.  However, with any procedure there are risks, side effects, and the possibility of complications.  The risks and complications are dependent upon the sites that are lesioned, or the type of nerve block to be performed.  The closer the procedure is to the spine,  the more serious the risks are.  Great care is taken when placing the radio frequency needles, block needles or lesioning probes, but sometimes complications can occur. 1. Infection: Any time there is an injection through the skin, there is a risk of infection.  This is why sterile conditions are used for these blocks.  There are four possible types of infection. 1. Localized skin infection. 2. Central Nervous System Infection-This can be in the form of Meningitis, which can be deadly. 3. Epidural Infections-This can be in the form of an epidural abscess, which can cause pressure inside of the spine, causing compression of the spinal cord with subsequent paralysis. This would require an emergency surgery to decompress, and there are no guarantees that the patient would recover from the paralysis. 4. Discitis-This is an infection of the intervertebral discs.  It occurs in about 1% of discography procedures.  It is difficult to treat and it may lead to surgery.        2. Pain: the needles have to go through skin and soft tissues, will cause soreness.       3. Damage to internal structures:  The nerves to be lesioned may be near blood vessels or    other nerves which can be potentially damaged.       4. Bleeding: Bleeding is more common if the patient is taking blood thinners such as  aspirin, Coumadin, Ticiid, Plavix, etc., or if he/she have some genetic predisposition  such as   hemophilia. Bleeding into the spinal canal can cause compression of the spinal  cord with subsequent paralysis.  This would require an emergency surgery to  decompress and there are no guarantees that the patient would recover from the  paralysis.       5. Pneumothorax:  Puncturing of a lung is a possibility, every time a needle is introduced in  the area of the chest or upper back.  Pneumothorax refers to free air around the  collapsed lung(s), inside of the thoracic cavity (chest cavity).  Another two possible  complications  related to a similar event would include: Hemothorax and Chylothorax.   These are variations of the Pneumothorax, where instead of air around the collapsed  lung(s), you may have blood or chyle, respectively.       6. Spinal headaches: They may occur with any procedures in the area of the spine.       7. Persistent CSF (Cerebro-Spinal Fluid) leakage: This is a rare problem, but may occur  with prolonged intrathecal or epidural catheters either due to the formation of a fistulous  track or a dural tear.       8. Nerve damage: By working so close to the spinal cord, there is always a possibility of  nerve damage, which could be as serious as a permanent spinal cord injury with  paralysis.       9. Death:  Although rare, severe deadly allergic reactions known as "Anaphylactic  reaction" can occur to any of the medications used.      10. Worsening of the symptoms:  We can always make thing worse.  What are the chances of something like this happening? Chances of any of this occuring are extremely low.  By statistics, you have more of a chance of getting killed in a motor vehicle accident: while driving to the hospital than any of the above occurring .  Nevertheless, you should be aware that they are possibilities.  In general, it is similar to taking a shower.  Everybody knows that you can slip, hit your head and get killed.  Does that mean that you should not shower again?  Nevertheless always keep in mind that statistics do not mean anything if you happen to be on the wrong side of them.  Even if a procedure has a 1 (one) in a 1,000,000 (million) chance of going wrong, it you happen to be that one..Also, keep in mind that by statistics, you have more of a chance of having something go wrong when taking medications.  Who should not have this procedure? If you are on a blood thinning medication (e.g. Coumadin, Plavix, see list of "Blood Thinners"), or if you have an active infection going on, you should not  have the procedure.  If you are taking any blood thinners, please inform your physician.  How should I prepare for this procedure?  Do not eat or drink anything at least six hours prior to the procedure.  Bring a driver with you .  It cannot be a taxi.  Come accompanied by an adult that can drive you back, and that is strong enough to help you if your legs get weak or numb from the local anesthetic.  Take all of your medicines the morning of the procedure with just enough water to swallow them.  If you have diabetes, make sure that you are scheduled to have your procedure done first thing in the morning, whenever possible.  If you have diabetes,   take only half of your insulin dose and notify our nurse that you have done so as soon as you arrive at the clinic.  If you are diabetic, but only take blood sugar pills (oral hypoglycemic), then do not take them on the morning of your procedure.  You may take them after you have had the procedure.  Do not take aspirin or any aspirin-containing medications, at least eleven (11) days prior to the procedure.  They may prolong bleeding.  Wear loose fitting clothing that may be easy to take off and that you would not mind if it got stained with Betadine or blood.  Do not wear any jewelry or perfume  Remove any nail coloring.  It will interfere with some of our monitoring equipment.  NOTE: Remember that this is not meant to be interpreted as a complete list of all possible complications.  Unforeseen problems may occur.  BLOOD THINNERS The following drugs contain aspirin or other products, which can cause increased bleeding during surgery and should not be taken for 2 weeks prior to and 1 week after surgery.  If you should need take something for relief of minor pain, you may take acetaminophen which is found in Tylenol,m Datril, Anacin-3 and Panadol. It is not blood thinner. The products listed below are.  Do not take any of the products listed below  in addition to any listed on your instruction sheet.  A.P.C or A.P.C with Codeine Codeine Phosphate Capsules #3 Ibuprofen Ridaura  ABC compound Congesprin Imuran rimadil  Advil Cope Indocin Robaxisal  Alka-Seltzer Effervescent Pain Reliever and Antacid Coricidin or Coricidin-D  Indomethacin Rufen  Alka-Seltzer plus Cold Medicine Cosprin Ketoprofen S-A-C Tablets  Anacin Analgesic Tablets or Capsules Coumadin Korlgesic Salflex  Anacin Extra Strength Analgesic tablets or capsules CP-2 Tablets Lanoril Salicylate  Anaprox Cuprimine Capsules Levenox Salocol  Anexsia-D Dalteparin Magan Salsalate  Anodynos Darvon compound Magnesium Salicylate Sine-off  Ansaid Dasin Capsules Magsal Sodium Salicylate  Anturane Depen Capsules Marnal Soma  APF Arthritis pain formula Dewitt's Pills Measurin Stanback  Argesic Dia-Gesic Meclofenamic Sulfinpyrazone  Arthritis Bayer Timed Release Aspirin Diclofenac Meclomen Sulindac  Arthritis pain formula Anacin Dicumarol Medipren Supac  Analgesic (Safety coated) Arthralgen Diffunasal Mefanamic Suprofen  Arthritis Strength Bufferin Dihydrocodeine Mepro Compound Suprol  Arthropan liquid Dopirydamole Methcarbomol with Aspirin Synalgos  ASA tablets/Enseals Disalcid Micrainin Tagament  Ascriptin Doan's Midol Talwin  Ascriptin A/D Dolene Mobidin Tanderil  Ascriptin Extra Strength Dolobid Moblgesic Ticlid  Ascriptin with Codeine Doloprin or Doloprin with Codeine Momentum Tolectin  Asperbuf Duoprin Mono-gesic Trendar  Aspergum Duradyne Motrin or Motrin IB Triminicin  Aspirin plain, buffered or enteric coated Durasal Myochrisine Trigesic  Aspirin Suppositories Easprin Nalfon Trillsate  Aspirin with Codeine Ecotrin Regular or Extra Strength Naprosyn Uracel  Atromid-S Efficin Naproxen Ursinus  Auranofin Capsules Elmiron Neocylate Vanquish  Axotal Emagrin Norgesic Verin  Azathioprine Empirin or Empirin with Codeine Normiflo Vitamin E  Azolid Emprazil Nuprin Voltaren  Bayer  Aspirin plain, buffered or children's or timed BC Tablets or powders Encaprin Orgaran Warfarin Sodium  Buff-a-Comp Enoxaparin Orudis Zorpin  Buff-a-Comp with Codeine Equegesic Os-Cal-Gesic   Buffaprin Excedrin plain, buffered or Extra Strength Oxalid   Bufferin Arthritis Strength Feldene Oxphenbutazone   Bufferin plain or Extra Strength Feldene Capsules Oxycodone with Aspirin   Bufferin with Codeine Fenoprofen Fenoprofen Pabalate or Pabalate-SF   Buffets II Flogesic Panagesic   Buffinol plain or Extra Strength Florinal or Florinal with Codeine Panwarfarin   Buf-Tabs Flurbiprofen Penicillamine   Butalbital Compound Four-way cold tablets   Penicillin   Butazolidin Fragmin Pepto-Bismol   Carbenicillin Geminisyn Percodan   Carna Arthritis Reliever Geopen Persantine   Carprofen Gold's salt Persistin   Chloramphenicol Goody's Phenylbutazone   Chloromycetin Haltrain Piroxlcam   Clmetidine heparin Plaquenil   Cllnoril Hyco-pap Ponstel   Clofibrate Hydroxy chloroquine Propoxyphen         Before stopping any of these medications, be sure to consult the physician who ordered them.  Some, such as Coumadin (Warfarin) are ordered to prevent or treat serious conditions such as "deep thrombosis", "pumonary embolisms", and other heart problems.  The amount of time that you may need off of the medication may also vary with the medication and the reason for which you were taking it.  If you are taking any of these medications, please make sure you notify your pain physician before you undergo any procedures.         Trigger Point Injection Trigger points are areas where you have pain. A trigger point injection is a shot given in the trigger point to help relieve pain for a few days to a few months. Common places for trigger points include:  The neck.  The shoulders.  The upper back.  The lower back. A trigger point injection will not cure long-term (chronic) pain permanently. These injections do  not always work for every person. For some people, they can help to relieve pain for a few days to a few months. Tell a health care provider about:  Any allergies you have.  All medicines you are taking, including vitamins, herbs, eye drops, creams, and over-the-counter medicines.  Any problems you or family members have had with anesthetic medicines.  Any blood disorders you have.  Any surgeries you have had.  Any medical conditions you have. What are the risks? Generally, this is a safe procedure. However, problems may occur, including:  Infection.  Bleeding or bruising.  Allergic reaction to the injected medicine.  Irritation of the skin around the injection site. What happens before the procedure? Ask your health care provider about:  Changing or stopping your regular medicines. This is especially important if you are taking diabetes medicines or blood thinners.  Taking medicines such as aspirin and ibuprofen. These medicines can thin your blood. Do not take these medicines unless your health care provider tells you to take them.  Taking over-the-counter medicines, vitamins, herbs, and supplements. What happens during the procedure?   Your health care provider will feel for trigger points. A marker may be used to circle the area for the injection.  The skin over the trigger point will be washed with a germ-killing (antiseptic) solution.  A thin needle is used for the injection. You may feel pain or a twitching feeling when the needle enters the trigger point.  A numbing solution may be injected into the trigger point. Sometimes a medicine to keep down inflammation is also injected.  Your health care provider may move the needle around the area where the trigger point is located until the tightness and twitching goes away.  After the injection, your health care provider may put gentle pressure over the injection site.  The injection site will be covered with a  bandage (dressing). The procedure may vary among health care providers and hospitals. What can I expect after treatment? After treatment, you may have:  Soreness and stiffness for 1-2 days.  A dressing. This can be taken off in a few hours or as told by your health care provider. Follow these  instructions at home: Injection site care  Remove your dressing as told by your health care provider.  Check your injection site every day for signs of infection. Check for: ? Redness, swelling, or pain. ? Fluid or blood. ? Warmth. ? Pus or a bad smell. Managing pain, stiffness, and swelling  If directed, put ice on the affected area. ? Put ice in a plastic bag. ? Place a towel between your skin and the bag. ? Leave the ice on for 20 minutes, 2-3 times a day. General instructions  If you were asked to stop your regular medicines, ask your health care provider when you may start taking them again.  Return to your normal activities as told by your health care provider. Ask your health care provider what activities are safe for you.  Do not take baths, swim, or use a hot tub until your health care provider approves.  You may be asked to see an occupational or physical therapist for exercises that reduce muscle strain and stretch the area of the trigger point.  Keep all follow-up visits as told by your health care provider. This is important. Contact a health care provider if:  Your pain comes back, and it is worse than before the injection. You may need more injections.  You have chills or a fever.  The injection site becomes more painful, red, swollen, or warm to the touch. Summary  A trigger point injection is a shot given in the trigger point to help relieve pain for a few days to a few months.  Common places for trigger point injections are the neck, shoulder, upper back, and lower back.  These injections do not always work for every person, but for some people, the injections  can help to relieve pain for a few days to a few months.  Contact a health care provider if symptoms come back or they are worse than before treatment. Also, get help if the injection site becomes more painful, red, swollen, or warm to the touch. This information is not intended to replace advice given to you by your health care provider. Make sure you discuss any questions you have with your health care provider. Document Revised: 10/12/2018 Document Reviewed: 10/12/2018 Elsevier Patient Education  [place].

## 2019-12-19 NOTE — Progress Notes (Signed)
Subjective:   Grant Young is a 74 y.o. male who presents for Medicare Annual/Subsequent preventive examination.  Review of Systems:   Cardiac Risk Factors include: male gender;dyslipidemia;hypertension;advanced age (>74men, >3 women)     Objective:    Vitals: There were no vitals taken for this visit.  There is no height or weight on file to calculate BMI.  Advanced Directives 12/19/2019 11/16/2019 11/09/2019 11/07/2019  Does Patient Have a Medical Advance Directive? Yes Yes Yes Yes  Type of Industrial/product designer of [employer] Power of [place]  Does patient want to make changes to medical advance directive? No - Patient declined No - Patient declined No - Patient declined No - Patient declined  Copy of [place] in Chart? Yes - validated most recent copy scanned in chart (See row information) Yes - validated most recent copy scanned in chart (See row information) - No - copy requested    Tobacco Social History   Tobacco Use  Smoking Status Never Smoker  Smokeless Tobacco Never Used     Counseling given: Not Answered   Clinical Intake:  Pre-visit preparation completed: Yes  Pain : (per pain managment)     BMI - recorded: 21 Nutritional Status: BMI of 19-24  Normal Nutritional Risks: None Diabetes: No  How often do you need to have someone help you when you read instructions, pamphlets, or other written materials from your doctor or pharmacy?: 1 - Never What is the last grade level you completed in school?: masters degree  Interpreter Needed?: No     Past Medical History:  Diagnosis Date  . Anxiety    Per New Patient Packet  . Chronic back pain    Per New Patient Packet  . Chronic heart disease    Per New Patient Packet  . Depression   . Headache   . History of CT scan of brain 09/14/2018   Per New Patient Packet  . History of depression    Per New Patient  Packet  . History of gastritis    Per New Patient Packet  . History of headache    Per New Patient Packet  . History of kidney stones    Per New Patient Packet  . History of neuropathy    Per New Patient Packet  . Hypertension    Per New Patient Packet  . Insomnia   . Lumbar radicular pain    Per New Patient Packet  . Lyme disease    Per New Patient Packet  . Sleep trouble    Per New Patient Packet  . Substance abuse [facility])    Past Surgical History:  Procedure Laterality Date  . BACK SURGERY    . CHOLECYSTECTOMY  2017  . COLONOSCOPY  09/15/2015   Per New Patient Packet  . GALLBLADDER SURGERY  09/15/2015   Gallbladder Removal. Procedure done by Dr.Beverly. Per New Patient Packet  . KIDNEY STONE SURGERY  09/14/1980   Too many to count. Per New Patient Packet 09/14/1980-09/15/1995  . KIDNEY STONE SURGERY  09/15/1995   Too many to count. Per New Patient Packet  . LITHOTRIPSY  09/14/2014   Per New Patient Packet  . LITHOTRIPSY  09/14/1996   No Stints Used. Per New Patient Packet  . LITHOTRIPSY  09/14/1997   No Stints used. Per New Patient Packet  . PAIN PUMP IMPLANTATION    . PAIN PUMP REMOVAL    . SHOULDER SURGERY  Right 1984  . SIGMOIDOSCOPY  09/14/2017   Per New Patient Packet  . SPINAL CORD STIMULATOR IMPLANT    . TONSILLECTOMY     Family History  Problem Relation Age of Onset  . Heart disease Father   . Heart failure Father   . Hypertension Father   . Stroke Father   . Stroke Mother   . Dementia Mother   . Kidney Stones Daughter   . Anxiety disorder Daughter   . OCD Daughter    Social History   Socioeconomic History  . Marital status: Married    Spouse name: Not on file  . Number of children: Not on file  . Years of education: Not on file  . Highest education level: Not on file  Occupational History  . Not on file  Tobacco Use  . Smoking status: Never Smoker  . Smokeless tobacco: Never Used  Substance and Sexual Activity  . Alcohol use: Yes     Comment: 1 Drink a Month.  . Drug use: Not Currently  . Sexual activity: Not on file  Other Topics Concern  . Not on file  Social History Narrative   Tobacco use, amount per day now: None   Past tobacco use, amount per day: None   How many years did you use tobacco: 0   Alcohol use (drinks per week): 0-1 Month   Diet:   Do you drink/eat things with caffeine: Occasionally ( Hot Chocolate and maybe 1/4 of 16oz Pepsi 2-3 times a week.   Marital status: Married                                  What year were you married? 1970   Do you live in a house, apartment, assisted living, condo, trailer, etc.? House   Is it one or more stories? One   How many persons live in your home? 2   Do you have pets in your home?( please list)  No   Highest Level of education completed: Masters   Current or past profession: Intensive Transport planner for [employer]   Do you exercise? Yes                                    Type and how often? [place], Recumbent Bike, 3 times a week. Try to get a 1.2 mile walk at least 3 times a week or more.     Do you have a living will? Yes   Do you have a DNR form?  No                                 If not, do you want to discuss one?   Do you have signed POA/HPOA forms? Yes                       If so, please bring to you appointment    Do you have any difficulty bathing or dressing yourself? No    Do you have difficulty preparing food or eating? No   Do you have difficulty managing your medications? No   Do you have any difficulty managing your finances? No   Do you have any difficulty affording your medications? No  Social Determinants of Health   Financial Resource Strain:   . Difficulty of Paying Living Expenses:   Food Insecurity:   . Worried About Charity fundraiser in the Last Year:   . Arboriculturist in the Last Year:   Transportation Needs:   . Film/video editor (Medical):   Marland Kitchen Lack of Transportation (Non-Medical):     Physical Activity:   . Days of Exercise per Week:   . Minutes of Exercise per Session:   Stress:   . Feeling of Stress :   Social Connections:   . Frequency of Communication with Friends and Family:   . Frequency of Social Gatherings with Friends and Family:   . Attends Religious Services:   . Active Member of Clubs or Organizations:   . Attends Archivist Meetings:   Marland Kitchen Marital Status:     Outpatient Encounter Medications as of 12/19/2019  Medication Sig  . amitriptyline (ELAVIL) 10 MG tablet Take 20 mg by mouth at bedtime.   Marland Kitchen anastrozole (ARIMIDEX) 1 MG tablet Take 0.5 mg by mouth 2 (two) times a week.   . butalbital-aspirin-caffeine (FIORINAL) 50-325-40 MG tablet Take 1 tablet by mouth as needed for headache.   . chlorthalidone (HYGROTON) 25 MG tablet Take 1 tablet (25 mg total) by mouth daily.  . Cyanocobalamin (VITAMIN B-12) 5000 MCG LOZG Take 1 lozenge by mouth daily.   . Digestive Aids Mixture (DIGESTION GB PO) Take 1 capsule by mouth daily.   Eduard Roux (AIMOVIG) 140 MG/ML SOAJ Inject 140 mg into the skin. Every 4 weeks for Headache.  . escitalopram (LEXAPRO) 20 MG tablet Take 20 mg by mouth daily.   Marland Kitchen ezetimibe (ZETIA) 10 MG tablet Take 10 mg by mouth daily.   Marland Kitchen GLUTATHIONE PO Take 1 tablet by mouth daily.   Marland Kitchen Hyoscyamine Sulfate (HYOSCYAMINE PO) 1 tablet as needed. Every 5 minutes as needed  . Loperamide HCl (IMODIUM PO) Take by mouth as needed.   Marland Kitchen LORazepam (ATIVAN) 1 MG tablet Take 1 mg by mouth 2 (two) times daily.   Marland Kitchen MAGNESIUM BISGLYCINATE PO Take 400 mg by mouth at bedtime.   . Melatonin 10 MG TABS Take 10 mg by mouth at bedtime.   . Misc Natural Products (CORTISOLV PO) Take by mouth 2 (two) times daily.   . Multiple Vitamins-Minerals (VITAMIN D3 COMPLETE PO) Take 2,000 Units by mouth daily.   Marland Kitchen NATTOKINASE PO Take 200 mg by mouth daily.  . ondansetron (ZOFRAN) 4 MG tablet Take 4 mg by mouth as needed for nausea or vomiting.   Marland Kitchen OVER THE COUNTER  MEDICATION Take 1 tablet by mouth in the morning, at noon, and at bedtime. [place] Supreme   . OVER THE COUNTER MEDICATION Take 2 tablets by mouth daily. Candicidal - for yeast  . PAPAYA ENZYME PO Take by mouth 2 (two) times daily.   . propranolol (INDERAL) 40 MG tablet Take 40 mg by mouth in the morning and at bedtime.  . RABEprazole (ACIPHEX) 20 MG tablet Take 20 mg by mouth 2 (two) times daily.   . sucralfate (CARAFATE) 1 g tablet Take 1 g by mouth 4 (four) times daily as needed.   . Testosterone Cypionate (DEPO-TESTOSTERONE IM) Inject 0.4 mLs into the muscle every 14 (fourteen) days.   . tizanidine (ZANAFLEX) 2 MG capsule Take 2 mg by mouth every 6 (six) hours as needed.    No facility-administered encounter medications on file as of 12/19/2019.  Activities of Daily Living In your present state of health, do you have any difficulty performing the following activities: 12/19/2019  Hearing? N  Vision? N  Difficulty concentrating or making decisions? N  Walking or climbing stairs? Y  Comment due to neuropathy  Dressing or bathing? N  Doing errands, shopping? N  Preparing Food and eating ? N  Using the Toilet? N  In the past six months, have you accidently leaked urine? N  Do you have problems with loss of bowel control? N  Managing your Medications? N  Managing your Finances? N  Housekeeping or managing your Housekeeping? N  Some recent data might be hidden    Patient Care Team: [name], NP as PCP - General (Geriatric Medicine) Kate Sable, MD as PCP - Cardiology (Cardiology) Mhoon, Larkin Ina, MD (Psychiatry) Dermatology, Kara Mead, MD as Consulting Physician (Gastroenterology)   Assessment:   This is a routine wellness examination for Grant Young.  Exercise Activities and Dietary recommendations Current Exercise Habits: Home exercise routine, Type of exercise: strength training/weights;calisthenics;Other - see comments(bike), Time (Minutes): 30,  Frequency (Times/Week): 2, Weekly Exercise (Minutes/Week): 60  Goals    . Increase physical activity     Increase physical activity to 3 days a week, 30 mins plus       Fall Risk Fall Risk  12/19/2019 11/09/2019  Falls in the past year? 0 0  Number falls in past yr: 0 0  Injury with Fall? 0 0   Is the patient's home free of loose throw rugs in walkways, pet beds, electrical cords, etc?   yes      Grab bars in the bathroom? yes      Handrails on the stairs?   yes      Adequate lighting?   yes  Timed Get Up and Go Performed: na  Depression Screen PHQ 2/9 Scores 12/19/2019  PHQ - 2 Score 0    Cognitive Function     6CIT Screen 12/19/2019  What Year? 0 points  What month? 0 points  What time? 0 points  Count back from 20 0 points  Months in reverse 2 points  Repeat phrase 2 points  Total Score 4    Immunization History  Administered Date(s) Administered  . Influenza Nasal 07/31/2004, 05/21/2010, 07/06/2014, 07/03/2015  . Influenza-Unspecified 09/15/2015, 09/14/2016, 06/15/2018  . Pneumococcal Conjugate-13 09/16/2005, 12/20/2013  . Pneumococcal Polysaccharide-23 02/16/2017  . Tdap 03/02/2005  . Zoster 09/15/2015  . Zoster Recombinat (Shingrix) 03/14/2017, 06/14/2018    Qualifies for Shingles Vaccine? done  Screening Tests Health Maintenance  Topic Date Due  . Hepatitis C Screening  Never done  . COLONOSCOPY  Never done  . TETANUS/TDAP  03/03/2015  . INFLUENZA VACCINE  04/14/2020  . PNA vac Low Risk Adult  Completed   Cancer Screenings: Lung: Low Dose CT Chest recommended if Age 55-80 years, 30 pack-year currently smoking OR have quit w/in 15years. Patient does not qualify. Colorectal: up to date  Additional Screenings: Hepatitis C Screening:completed       Plan:     I have personally reviewed and noted the following in the patient's chart:   . Medical and social history . Use of alcohol, tobacco or illicit drugs  . Current medications and  supplements . Functional ability and status . Nutritional status . Physical activity . Advanced directives . List of other physicians . Hospitalizations, surgeries, and ER visits in previous 12 months . Vitals . Screenings to include cognitive, depression, and falls . Referrals  and appointments  In addition, I have reviewed and discussed with patient certain preventive protocols, quality metrics, and best practice recommendations. A written personalized care plan for preventive services as well as general preventive health recommendations were provided to patient.     [name], NP  12/19/2019

## 2019-12-21 ENCOUNTER — Encounter: Payer: Self-pay | Admitting: Student in an Organized Health Care Education/Training Program

## 2019-12-25 ENCOUNTER — Telehealth: Payer: Self-pay | Admitting: *Deleted

## 2019-12-25 NOTE — Telephone Encounter (Signed)
Called patient to inform him that Botox has been ordered and will probably require PA.

## 2019-12-25 NOTE — Telephone Encounter (Signed)
-----   Message from [name], MD sent at 12/25/2019 12:56 PM EDT ----- Order placed for botox, please call pt, will need to get insurance approval before scheduling

## 2019-12-25 NOTE — Addendum Note (Signed)
Addended by: [name] on: 12/25/2019 12:56 PM   Modules accepted: Orders

## 2020-01-01 ENCOUNTER — Other Ambulatory Visit: Payer: Self-pay

## 2020-01-01 ENCOUNTER — Encounter: Payer: Self-pay | Admitting: Student in an Organized Health Care Education/Training Program

## 2020-01-01 ENCOUNTER — Ambulatory Visit (HOSPITAL_BASED_OUTPATIENT_CLINIC_OR_DEPARTMENT_OTHER): Payer: Medicare Other | Admitting: Student in an Organized Health Care Education/Training Program

## 2020-01-01 ENCOUNTER — Ambulatory Visit
Admission: RE | Admit: 2020-01-01 | Discharge: 2020-01-01 | Disposition: A | Discharge status: Home / Self Care | Payer: Medicare Other | Source: Ambulatory Visit | Attending: Student in an Organized Health Care Education/Training Program | Admitting: Student in an Organized Health Care Education/Training Program

## 2020-01-01 VITALS — BP 161/80 | HR 72 | Temp 97.2°F | Resp 16 | Ht 69.0 in | Wt 143.0 lb

## 2020-01-01 DIAGNOSIS — G43701 Chronic migraine without aura, not intractable, with status migrainosus: Secondary | ICD-10-CM | POA: Diagnosis present

## 2020-01-01 DIAGNOSIS — M7918 Myalgia, other site: Secondary | ICD-10-CM | POA: Insufficient documentation

## 2020-01-01 MED ORDER — LIDOCAINE HCL 2 % IJ SOLN
INTRAMUSCULAR | Status: AC
  Filled 2020-01-01: qty 20

## 2020-01-01 MED ORDER — ROPIVACAINE HCL 2 MG/ML IJ SOLN
INTRAMUSCULAR | Status: AC
  Filled 2020-01-01: qty 10

## 2020-01-01 MED ORDER — ROPIVACAINE HCL 2 MG/ML IJ SOLN
2.0000 mL | Freq: Once | INTRAMUSCULAR | Status: AC
  Administered 2020-01-01: 2 mL via EPIDURAL

## 2020-01-01 MED ORDER — SODIUM CHLORIDE (PF) 0.9 % IJ SOLN
INTRAMUSCULAR | Status: AC
  Filled 2020-01-01: qty 10

## 2020-01-01 MED ORDER — ONABOTULINUMTOXINA 100 UNITS IJ SOLR
100.0000 [IU] | Freq: Once | INTRAMUSCULAR | Status: AC
  Administered 2020-01-01: 100 [IU] via INTRAMUSCULAR
  Filled 2020-01-01: qty 100

## 2020-01-01 MED ORDER — ONABOTULINUMTOXINA 100 UNITS IJ SOLR
55.0000 [IU] | Freq: Once | INTRAMUSCULAR | Status: AC
  Administered 2020-01-01: 55 [IU] via INTRAMUSCULAR
  Filled 2020-01-01: qty 100

## 2020-01-01 NOTE — Patient Instructions (Signed)
Botox  What is Botox (Onabotulinumtoxina)?  Botox (Onabotulinumtoxina), also called botulinum toxin type A, is made from the bacteria that causes botulism.  Botulinum toxin blocks nerve activity in the muscles, causing temporary reduction in muscle activity.  Botox is made from human plasma (part of the blood) which may contain viruses and other infectious agents.  Donated plasma is tested and treated to reduce the risk of it containing infectious agents, but there is still a small possibility that it could transmit disease.  Talk with your doctor about the risk and benefits of using this medication.  FDA pregnancy category C.  It is not known whether botulinum toxin will harm an unborn baby.  Tell your doctor if you are pregnant or plan to become pregnant while using this medication.  It is not known whether botulinum toxin passes into breast milk or if it could harm a nursing baby.  Do not receive this medication if you are breastfeeding without discussing with your doctor.  Botox is used to prevent chronic migraine headaches in adults who have migraines for more than 15 days per month, each lasting 4 hours or longer.  Botox should not be used to treat common tension headache.  To make sure you can safely use Botox, tell your doctor if you have any of these conditions:  Amyotrophic lateral sclerosis (ALS or Lou Gehrig's Disease)  Myasthenia Gravis  Breathing disorders  Lambert-Eaton Syndrome  Problems swallowing  Facial muscle weakness  A change in normal appearance of your face  A seizure disorder  Bleeding problems  Heart disease  If you have had or will have surgery, or if you have received other Botulinum toxin injections in the past 4 months    What are the possible side effects of Botox?  **Call your doctor immediately if you have any of these serious side effects which can occur up to several weeks after an injection:  Trouble breathing, talking or  swallowing  Hoarse voice, drooping eyelids  Unusual or severe muscle weakness (especially in a body area that was not injected  with the medication)  Loss of bladder control  Problems with vision  Crusting or drainage from your eyes  Severe skin rash or itching  Fast, slow, or uneven heartbeats  Chest pain or heavy feeling, pain spreading to the arm or shoulder, general ill feeling  Less side effects may include:  Muscle weakness near where the medicine was injected  Bruising, bleeding, pain, redness or swelling where the injection was given  Headache, muscle stiffness, neck or back pain  Fever, cough, sore throat, runny nose, flu like symptoms  Dizziness, drowsiness, tired feeling  Nausea, diarrhea, stomach pain, loss of appetite  Dry mouth, dry eyes, ringing in ears  Increased sweating in areas other than under the arms  Itchy, watery eyes, increased sensitivity to light  Eyelid swelling or bruising  Pre and Post treatment Instructions  Pre Treatment Instructions:  3 days before treatment, avoid topical facial or "anti aging" products.  Avoid waxing, bleaching, tweezing or the use of hair removal cream.  7 days before treatment, avoid blood thinning over the counter medications such as aspirin, Motrin, Aleve and Ibuprofen.  Also avoid herbal supplements such as Garlic, Vitamin E, Ginkgo Biloba, [place] Wart and Omega-3 capsules.  Do not drink alcoholic beverages 24 hours before or after your treatment to avoid bruising.  Inform your provider if you have perioral herpes to receive advice on antiviral therapy prior to treatment.  Day of   Treatment:  Arrive to office with a "clean face".  Do not wear make-up.  You may experience a mild amount of tenderness or a stinging sensation following injection.  Redness and swelling are normal.  Some bruising may also be visible.  You may experience some tenderness at the treatment site that can last for a few hours  or a few days.  Immediately after treatment:  It is best to try to exercise your treated muscles for 1-2 hours after treatment (practice frowning, raising your eyebrows and squinting).  This helps to work the BOTOX into your muscles.  Stay in a vertical position for four hours following injections.  Do not "rest your head" or lie down.  Sit upright.  You may apply an ice or cold gel pack to the area as this helps reduce swelling and the potential for bruising.  Once you have iced the area and any pinpoint bleeding from the injection site has subsided, you may begin wearing makeup.  Avoid placing excessive pressure on the treated areas for the first few days; when cleansing your face or applying make-up, be very gentle.  Avoid exercise or strenuous activities for the remainder of the treatment day; you may resume other normal activities/routines immediately.  You may take Acetaminophen/Tylenol if you experience any mild tenderness or discomfort.  Avoid extended UV exposure until any redness/swelling has subsided.  Be sure to apply an SPF 30 or higher sunscreen.  Wait a minimum of 24 hours (or as directed by your provider) before receiving any skin care or laser treatment.  Please contact us at [phone] if you have any questions or concerns    

## 2020-01-01 NOTE — Progress Notes (Signed)
Safety precautions to be maintained throughout the outpatient stay will include: orient to surroundings, keep bed in low position, maintain call bell within reach at all times, provide assistance with transfer out of bed and ambulation.  

## 2020-01-01 NOTE — Progress Notes (Signed)
PROVIDER NOTE: Information contained herein reflects review and annotations entered in association with encounter. Interpretation of such information and data should be left to medically-trained personnel. Information provided to patient can be located elsewhere in the medical record under "Patient Instructions". Document created using STT-dictation technology, any transcriptional errors that may result from process are unintentional.    Patient: Grant Young  Service Category: Procedure  Provider: [name], MD  DOB: November 27, 1945  DOS: 01/01/2020  Location: [facility]  MRN: [id number]  Setting: Ambulatory - outpatient  Referring Provider: [name], NP  Type: Established Patient  Specialty: Interventional Pain Management  PCP: [name], NP   Primary Reason for Visit: Interventional Pain Management Treatment. CC: Back Pain (lumbar ) and Headache (entire head )  Procedure:          Anesthesia, Analgesia, Anxiolysis:  Botox for migraine management  Local   Indications: 1. 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 Muscle pain, myofascial    Pain Score: Pre-procedure: 6 (8 for headache pain)/10 Post-procedure: 5 /10   Pre-op Assessment:  Grant Young is a 74 y.o. (year old), male patient, seen today for interventional treatment. He  has a past surgical history that includes Kidney stone surgery (09/14/1980); Kidney stone surgery (09/15/1995); Lithotripsy (09/14/2014); Lithotripsy (09/14/1996); Lithotripsy (09/14/1997); Gallbladder surgery (09/15/2015); Sigmoidoscopy (09/14/2017); Colonoscopy (09/15/2015); Cholecystectomy (2017); Shoulder surgery (Right, 1984); Back surgery; Spinal cord stimulator implant; Pain pump implantation; Pain pump removal; and Tonsillectomy. Grant Young has a current medication list which includes the following prescription(s): amitriptyline, anastrozole, butalbital-aspirin-caffeine, chlorthalidone, vitamin  b-12, digestive aids mixture, aimovig, escitalopram, ezetimibe, glutathione, hyoscyamine sulfate, loperamide hcl, lorazepam, magnesium bisglycinate, melatonin, misc natural products, multiple vitamins-minerals, nattokinase, ondansetron, OVER THE COUNTER MEDICATION, OVER THE COUNTER MEDICATION, papaya, propranolol, rabeprazole, sucralfate, testosterone cypionate, tizanidine, and prochlorperazine, and the following Facility-Administered Medications: botulinum toxin type a, botulinum toxin type a, and ropivacaine (pf) 2 mg/ml (0.2%). His primarily concern today is the Back Pain (lumbar ) and Headache (entire head )  Initial Vital Signs:  Pulse/HCG Rate: 68  Temp: (!) 97.2 F (36.2 C) Resp: 16 BP: (!) 159/106 SpO2: 100 %  BMI: Estimated body mass index is 21.12 kg/m as calculated from the following:   Height as of this encounter: 5\' 9"  (1.753 m).   Weight as of this encounter: 143 lb (64.9 kg).  Risk Assessment: Allergies: Reviewed. He is allergic to ace inhibitors; fluoxetine; metoclopramide; nalbuphine; other; amoxicillin-pot clavulanate; doxazosin; duloxetine; penicillins; tamsulosin; trazodone and nefazodone; amlodipine; cinoxacin; ciprofloxacin; nebivolol; olanzapine; olmesartan; pregabalin; prostaglandins; thyroid hormones; zolpidem; duloxetine hcl; fluoxetine hcl; gabapentin; and phenytoin.  Allergy Precautions: None required Coagulopathies: Reviewed. None identified.  Blood-thinner therapy: None at this time Active Infection(s): Reviewed. None identified. Grant Young is afebrile  Site Confirmation: Grant Young was asked to confirm the procedure and laterality before marking the site Procedure checklist: Completed Consent: Before the procedure and under the influence of no sedative(s), amnesic(s), or anxiolytics, the patient was informed of the treatment options, risks and possible complications. To fulfill our ethical and legal obligations, as recommended by the American Medical Association's  Code of Ethics, I have informed the patient of my clinical impression; the nature and purpose of the treatment or procedure; the risks, benefits, and possible complications of the intervention; the alternatives, including doing nothing; the risk(s) and benefit(s) of the alternative treatment(s) or procedure(s); and the risk(s) and benefit(s) of doing nothing. The patient was provided information about the general risks and  possible complications associated with the procedure. These may include, but are not limited to: failure to achieve desired goals, infection, bleeding, organ or nerve damage, allergic reactions, paralysis, and death. In addition, the patient was informed of those risks and complications associated to the procedure, such as failure to decrease pain; infection; bleeding; organ or nerve damage with subsequent damage to sensory, motor, and/or autonomic systems, resulting in permanent pain, numbness, and/or weakness of one or several areas of the body; allergic reactions; (i.e.: anaphylactic reaction); and/or death. Furthermore, the patient was informed of those risks and complications associated with the medications. These include, but are not limited to: allergic reactions (i.e.: anaphylactic or anaphylactoid reaction(s)); adrenal axis suppression; blood sugar elevation that in diabetics may result in ketoacidosis or comma; water retention that in patients with history of congestive heart failure may result in shortness of breath, pulmonary edema, and decompensation with resultant heart failure; weight gain; swelling or edema; medication-induced neural toxicity; particulate matter embolism and blood vessel occlusion with resultant organ, and/or nervous system infarction; and/or aseptic necrosis of one or more joints. Finally, the patient was informed that Medicine is not an exact science; therefore, there is also the possibility of unforeseen or unpredictable risks and/or possible complications  that may result in a catastrophic outcome. The patient indicated having understood very clearly. We have given the patient no guarantees and we have made no promises. Enough time was given to the patient to ask questions, all of which were answered to the patient's satisfaction. Mr. Bobbitt has indicated that he wanted to continue with the procedure. Attestation: I, the ordering provider, attest that I have discussed with the patient the benefits, risks, side-effects, alternatives, likelihood of achieving goals, and potential problems during recovery for the procedure that I have provided informed consent. Date  Time: 01/01/2020 10:37 AM  Pre-Procedure Preparation:  Monitoring: As per clinic protocol. Respiration, ETCO2, SpO2, BP, heart rate and rhythm monitor placed and checked for adequate function Safety Precautions: Patient was assessed for positional comfort and pressure points before starting the procedure. Time-out: I initiated and conducted the "Time-out" before starting the procedure, as per protocol. The patient was asked to participate by confirming the accuracy of the "Time Out" information. Verification of the correct person, site, and procedure were performed and confirmed by me, the nursing staff, and the patient. "Time-out" conducted as per Joint Commission's Universal Protocol (UP.01.01.01). Time: 1146  Description of Procedure:           Constitution and injection details as below:   2 x 100 unit vial of Botox brand onabotulinum toxin A was diluted with 2 mL preservative free saline EACH, for a final concentration of 5 units/0.1 mL. Injection sites were prepared for injection with alcohol swab  Botox was injected as follows:  5 units left corrugator 5 units right corrugator 5 units procerus  5 units in 2 sites, right frontalis (10 units total) 5units in 2 sites, left frontalis (10 units total) 5 units in each of 4 sites right temporalis (20 units total) 5 units in each of 4  sites left temporalis (20 units total) 5 units in each of 3 sites right occipitalis (15 units total) 5 units in each of 3 sites left occipitalis (15 units total) 5 units in each of 2 sites right cervical paraspinal at approximate C3-4 level (10 units total) 5 units in each of 2 sites left cervical paraspinal muscles at approximate C3-4 level (10 units total) 5 units in each of 3 sites  right trapezius (15 units total) 5 units in each of 3 sites left trapezius (15 units total)  45 units were wasted  A total of 155 units was administered by Dr Holley Raring   Procedure #1:  Anesthesia, Analgesia, Anxiolysis:  Type: Trigger Point Injection  Lumbar  Position: Prone   Lumbar Paraspinal   Type: Local Anesthesia Indication(s): Analgesia         Local Anesthetic: Lidocaine 1-2% Route: Infiltration ([place]/IM) IV Access: Declined Sedation: Declined    Indications: Myofascial pain syndrome   Description of Procedure:          DuraPrep (Iodine Povacrylex [0.7% available iodine] and Isopropyl Alcohol, 74% w/w) Safety Precautions: Aspiration looking for blood return was conducted prior to all injections. At no point did we inject any substances, as a needle was being advanced. No attempts were made at seeking any paresthesias. Safe injection practices and needle disposal techniques used. Medications properly checked for expiration dates. SDV (single dose vial) medications used. Description of the Procedure: Protocol guidelines were followed. The patient was placed in position over the fluoroscopy table. The target area was identified and the area prepped in the usual manner. Skin & deeper tissues infiltrated with local anesthetic. Appropriate amount of time allowed to pass for local anesthetics to take effect. The procedure needles were then advanced to the target area. Proper needle placement secured. Negative aspiration confirmed. Solution injected in intermittent fashion, asking for systemic symptoms every  0.5cc of injectate. The needles were then removed and the area cleansed, making sure to leave some of the prepping solution back to take advantage of its long term bactericidal properties.  Vitals:   01/01/20 1042 01/01/20 1043 01/01/20 1046 01/01/20 1158  BP:  (!) 159/106 (!) 156/82 (!) 161/80  Pulse:  68  72  Resp: 16   16  Temp: (!) 97.2 F (36.2 C)     TempSrc: Temporal     SpO2:  100%  99%  Weight: 143 lb (64.9 kg)     Height: 5\' 9"  (1.753 m)       Start Time: 1146 hrs. End Time: 1150 hrs. Materials:  Needle(s) Type: Regular needle Gauge: 25G Length: 1.5-in Medication(s): Please see orders for medications and dosing details. Lumbar paraspinal trigger point injected with 2 cc of 0.2% ropivacaine and dry needling performed.  Post-operative Assessment:  Post-procedure Vital Signs:  Pulse/HCG Rate: 72  Temp: (!) 97.2 F (36.2 C) Resp: 16 BP: (!) 161/80 SpO2: 99 %  EBL: None  Complications: No immediate post-treatment complications observed by team, or reported by patient.  Note: The patient tolerated the entire procedure well. A repeat set of vitals were taken after the procedure and the patient was kept under observation following institutional policy, for this type of procedure. Post-procedural neurological assessment was performed, showing return to baseline, prior to discharge. The patient was provided with post-procedure discharge instructions, including a section on how to identify potential problems. Should any problems arise concerning this procedure, the patient was given instructions to immediately contact us, at any time, without hesitation. In any case, we plan to contact the patient by telephone for a follow-up status report regarding this interventional procedure.  Comments:  No additional relevant information.  Plan of Care  Orders:  Orders Placed This Encounter  Procedures  . [facility] C-ARM 1-60 MIN NO REPORT    Intraoperative interpretation by  procedural physician at [place].    Standing Status:   Standing    Number of  Occurrences:   1    Order Specific Question:   Reason for exam:    Answer:   Assistance in needle guidance and placement for procedures requiring needle placement in or near specific anatomical locations not easily accessible without such assistance.   Medications ordered for procedure: Meds ordered this encounter  Medications  . botulinum toxin Type A (BOTOX) injection 100 Units  . botulinum toxin Type A (BOTOX) injection 55 Units  . ropivacaine (PF) 2 mg/mL (0.2%) (NAROPIN) injection 2 mL    Follow-up plan:   Return in about 6 weeks (around 02/12/2020) for Post Procedure Evaluation, in person.      Status post Botox No. 1 on 01/01/2020 for migraine, lumbar TPI as well    Recent Visits Date Type Provider Dept  12/19/19 Office Visit [name], MD [facility]  Showing recent visits within past 90 days and meeting all other requirements   Today's Visits Date Type Provider Dept  01/01/20 Procedure visit [name], MD [facility]  Showing today's visits and meeting all other requirements   Future Appointments Date Type Provider Dept  02/20/20 Appointment [name], MD [facility]  Showing future appointments within next 90 days and meeting all other requirements   Disposition: Discharge home  Discharge (Date  Time): 01/01/2020;   hrs.   Primary Care Physician: [name], NP Location: [facility] Note by: [name], MD Date: 01/01/2020; Time: 12:52 PM  Disclaimer:  Medicine is not an exact science. The only guarantee in medicine is that nothing is guaranteed. It is important to note that the decision to proceed with this intervention was based on the information collected from the patient. The Data and conclusions were drawn from the patient's questionnaire, the interview, and the physical examination. Because  the information was provided in large part by the patient, it cannot be guaranteed that it has not been purposely or unconsciously manipulated. Every effort has been made to obtain as much relevant data as possible for this evaluation. It is important to note that the conclusions that lead to this procedure are derived in large part from the available data. Always take into account that the treatment will also be dependent on availability of resources and existing treatment guidelines, considered by other Pain Management Practitioners as being common knowledge and practice, at the time of the intervention. For Medico-Legal purposes, it is also important to point out that variation in procedural techniques and pharmacological choices are the acceptable norm. The indications, contraindications, technique, and results of the above procedure should only be interpreted and judged by a Board-Certified Interventional Pain Specialist with extensive familiarity and expertise in the same exact procedure and technique.

## 2020-01-02 ENCOUNTER — Other Ambulatory Visit: Payer: Medicare Other

## 2020-01-02 ENCOUNTER — Telehealth: Payer: Self-pay

## 2020-01-02 NOTE — Telephone Encounter (Signed)
Called patient to check on him and number listed not correct.

## 2020-01-03 DIAGNOSIS — Z9889 Other specified postprocedural states: Secondary | ICD-10-CM | POA: Insufficient documentation

## 2020-01-03 DIAGNOSIS — M2010 Hallux valgus (acquired), unspecified foot: Secondary | ICD-10-CM | POA: Insufficient documentation

## 2020-01-04 ENCOUNTER — Ambulatory Visit: Payer: Medicare Other | Admitting: Cardiology

## 2020-01-21 ENCOUNTER — Encounter: Payer: Self-pay | Admitting: Nurse Practitioner

## 2020-01-22 MED ORDER — LORAZEPAM 1 MG PO TABS: 1.0000 mg | ORAL_TABLET | Freq: Two times a day (BID) | ORAL | 1 refills | Status: DC

## 2020-02-20 ENCOUNTER — Other Ambulatory Visit: Payer: Self-pay

## 2020-02-20 ENCOUNTER — Ambulatory Visit
Payer: Medicare Other | Attending: Student in an Organized Health Care Education/Training Program | Admitting: Student in an Organized Health Care Education/Training Program

## 2020-02-20 ENCOUNTER — Encounter: Payer: Self-pay | Admitting: Student in an Organized Health Care Education/Training Program

## 2020-02-20 VITALS — BP 134/92 | HR 68 | Temp 97.3°F | Ht 69.0 in | Wt 133.0 lb

## 2020-02-20 DIAGNOSIS — G43701 Chronic migraine without aura, not intractable, with status migrainosus: Secondary | ICD-10-CM | POA: Diagnosis not present

## 2020-02-20 DIAGNOSIS — Z981 Arthrodesis status: Secondary | ICD-10-CM

## 2020-02-20 DIAGNOSIS — M7918 Myalgia, other site: Secondary | ICD-10-CM | POA: Diagnosis present

## 2020-02-20 DIAGNOSIS — G894 Chronic pain syndrome: Secondary | ICD-10-CM

## 2020-02-20 DIAGNOSIS — M47816 Spondylosis without myelopathy or radiculopathy, lumbar region: Secondary | ICD-10-CM

## 2020-02-20 DIAGNOSIS — Z9689 Presence of other specified functional implants: Secondary | ICD-10-CM

## 2020-02-20 NOTE — Progress Notes (Signed)
PROVIDER NOTE: Information contained herein reflects review and annotations entered in association with encounter. Interpretation of such information and data should be left to medically-trained personnel. Information provided to patient can be located elsewhere in the medical record under "Patient Instructions". Document created using STT-dictation technology, any transcriptional errors that may result from process are unintentional.    Patient: Grant Young  Service Category: E/M  Provider: [name], MD  DOB: September 07, 1946  DOS: 02/20/2020  Specialty: Interventional Pain Management  MRN: [id number]  Setting: Ambulatory outpatient  PCP: [name], NP  Type: Established Patient    Referring Provider: [name], NP  Location: Office  Delivery: Face-to-face     HPI  Reason for encounter: [name], a 74 y.o. year old male, is here today for evaluation and management of his Chronic migraine without aura with status migrainosus, not intractable [G43.701]. Grant Young primary complain today is Headache Last encounter: Practice (01/02/2020). My last encounter with him was on 01/01/2020. Pertinent problems: Mr. 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 Pain Assessment: Severity of Chronic pain is reported as a 6 /10. Location: Head Left, Right, Lower/Denies. Onset: More than a month ago. Quality: Aching, Throbbing, Pounding, Shooting, Discomfort(dizzness). Timing: Constant. Modifying factor(s): rice pack, cold, meds at times. Vitals:  height is _0  (1.753 m) and weight is 133 lb (60.3 kg). His temperature is 97.3 F (36.3 C) (abnormal). His blood pressure is 134/92 (abnormal) and his pulse is 68. His oxygen saturation is 97%.   Patient follows up  status post botox therapy, states that it was not effective. No change in headache frequency or intensity. Is planning on having CT head and brain with contrast for complaint of diziness and headaches. Also intermittent balance issues. Utilizing Amovig as monthly injectable every 28 days, has done 3 cycles. Has not noticed any difference. Discussed SPG- sphenopallantine ganglion block for migraines, risks and benefits reviewed and patient would to proceed. Patient is also endorsing low back pain that is worse with lumbar extension and facet loading.  He does have a L5-S1 fusion in place as well as a spinal cord stimulator in place.  We discussed a L4-L5 and L5-S1 facet block bilaterally.  Risks and benefits reviewed and patient would like to proceed.  ROS  Constitutional: Denies any fever or chills Gastrointestinal: No reported hemesis, hematochezia, vomiting, or acute GI distress Musculoskeletal: Denies any acute onset joint swelling, redness, loss of ROM, or weakness Neurological: No reported episodes of acute onset apraxia, aphasia, dysarthria, agnosia, amnesia, paralysis, loss of coordination, or loss of consciousness positive low back pain, worse with facet loading  Medication Review  Digestive Aids Mixture, Erenumab-aooe, Glutathione, Hyoscyamine Sulfate, LORazepam, Loperamide HCl, Magnesium Bisglycinate, Melatonin, Misc Natural Products, Multiple Vitamins-Minerals, Nattokinase, OVER THE COUNTER MEDICATION, Papaya, RABEprazole, Testosterone Cypionate, Vitamin B-12, amitriptyline, anastrozole, butalbital-aspirin-caffeine, chlorthalidone, escitalopram, ezetimibe, ondansetron, prochlorperazine, propranolol, sucralfate, and tizanidine  History Review  Allergy: Mr. Sweeden is allergic to ace inhibitors; fluoxetine; metoclopramide; nalbuphine; other; amoxicillin-pot clavulanate; doxazosin; duloxetine; penicillins; tamsulosin; trazodone and nefazodone; amlodipine; cinoxacin; ciprofloxacin; nebivolol;  olanzapine; olmesartan; pregabalin; prostaglandins; thyroid hormones; zolpidem; duloxetine hcl; fluoxetine hcl; gabapentin; and phenytoin. Drug: Grant Young  reports previous drug use. Alcohol:  reports current alcohol use. Tobacco:  reports that he has never smoked. He has never used smokeless tobacco. Social: Mr.  Bisping  reports that he has never smoked. He has never used smokeless tobacco. He reports current alcohol use. He reports previous drug use. Medical:  has a past medical history of Anxiety, Chronic back pain, Chronic heart disease, Depression, Headache, History of CT scan of brain (09/14/2018), History of depression, History of gastritis, History of headache, History of kidney stones, History of neuropathy, Hypertension, Insomnia, Lumbar radicular pain, Lyme disease, Sleep trouble, and Substance abuse ([place]). Surgical: Grant Young  has a past surgical history that includes Kidney stone surgery (09/14/1980); Kidney stone surgery (09/15/1995); Lithotripsy (09/14/2014); Lithotripsy (09/14/1996); Lithotripsy (09/14/1997); Gallbladder surgery (09/15/2015); Sigmoidoscopy (09/14/2017); Colonoscopy (09/15/2015); Cholecystectomy (2017); Shoulder surgery (Right, 1984); Back surgery; Spinal cord stimulator implant; Pain pump implantation; Pain pump removal; and Tonsillectomy. Family: family history includes Anxiety disorder in his daughter; Dementia in his mother; Heart disease in his father; Heart failure in his father; Hypertension in his father; Kidney Stones in his daughter; OCD in his daughter; Stroke in his father and mother.  Laboratory Chemistry Profile   Renal Lab Results  Component Value Date   BUN 14 08/24/2019   CREATININE 0.8 08/24/2019   GFRAA 116 07/12/2019   GFRNONAA 96 07/12/2019     Hepatic Lab Results  Component Value Date   AST 17 08/24/2019   ALT 17 08/24/2019   ALBUMIN 4.2 08/24/2019   ALKPHOS 103 08/24/2019     Electrolytes Lab Results  Component Value Date   NA 144  08/24/2019   K 4.1 08/24/2019   CL 104 08/24/2019   CALCIUM 9.7 08/24/2019     Bone No results found for: VD25OH, VD125OH2TOT, SM2707EM7, [id number], 25OHVITD1, 25OHVITD2, 25OHVITD3, TESTOFREE, TESTOSTERONE   Inflammation (CRP: Acute Phase) (ESR: Chronic Phase) No results found for: CRP, ESRSEDRATE, LATICACIDVEN     Note: Above Lab results reviewed.  Recent Imaging Review  DG PAIN CLINIC C-ARM 1-60 MIN NO REPORT Fluoro was used, but no Radiologist interpretation will be provided.  Please refer to "NOTES" tab for provider progress note. Note: Reviewed        Physical Exam  General appearance: Well nourished, well developed, and well hydrated. In no apparent acute distress Mental status: Alert, oriented x 3 (person, place, & time)       Respiratory: No evidence of acute respiratory distress Eyes: PERLA Vitals: BP (!) 134/92   Pulse 68   Temp (!) 97.3 F (36.3 C)   Ht _0  (1.753 m)   Wt 133 lb (60.3 kg)   SpO2 97%   BMI 19.64 kg/m  BMI: Estimated body mass index is 19.64 kg/m as calculated from the following:   Height as of this encounter: _1  (1.753 m).   Weight as of this encounter: 133 lb (60.3 kg). Ideal: Ideal body weight: 70.7 kg (155 lb 13.8 oz)   Full movement of facial muscles- frontalis, procerus, corugators  Cervical Spine Area Exam  Skin & Axial Inspection: No masses, redness, edema, swelling, or associated skin lesions Alignment: Symmetrical Functional ROM: Unrestricted ROM      Stability: No instability detected Muscle Tone/Strength: Functionally intact. No obvious neuro-muscular anomalies detected. Sensory (Neurological): Unimpaired Palpation: No palpable anomalies               Upper Extremity (UE) Exam    Side: Right upper extremity  Side: Left upper extremity  Skin & Extremity Inspection: Skin color, temperature, and hair growth are WNL. No peripheral edema or cyanosis. No masses, redness, swelling, asymmetry, or associated skin lesions. No  contractures.  Skin & Extremity Inspection: Skin color, temperature, and hair growth are WNL. No peripheral edema or cyanosis. No masses, redness, swelling, asymmetry, or associated skin lesions. No contractures.  Functional ROM: Unrestricted ROM          Functional ROM: Unrestricted ROM          Muscle Tone/Strength: Functionally intact. No obvious neuro-muscular anomalies detected.  Muscle Tone/Strength: Functionally intact. No obvious neuro-muscular anomalies detected.  Sensory (Neurological): Unimpaired          Sensory (Neurological): Unimpaired          Palpation: No palpable anomalies              Palpation: No palpable anomalies              Provocative Test(s):  Phalen's test: deferred Tinel's test: deferred Apley's scratch test (touch opposite shoulder):  Action 1 (Across chest): deferred Action 2 (Overhead): deferred Action 3 (LB reach): deferred   Provocative Test(s):  Phalen's test: deferred Tinel's test: deferred Apley's scratch test (touch opposite shoulder):  Action 1 (Across chest): deferred Action 2 (Overhead): deferred Action 3 (LB reach): deferred    Lumbar Spine Area Exam  Skin & Axial Inspection: Well healed scar from previous spine surgery detected Alignment: Symmetrical Functional ROM: Pain restricted ROM affecting both sides Stability: No instability detected Muscle Tone/Strength: Functionally intact. No obvious neuro-muscular anomalies detected. Sensory (Neurological): Articular pain pattern, +pain with facet loading     Assessment   Status Diagnosis  Having a Flare-up Having a Flare-up Controlled 1. 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 Lumbar facet arthropathy   3. Muscle pain, myofascial   4. 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 History of lumbar fusion   6. 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 ELIAZAR OLIVAR has a current medication list which includes the following long-term medication(s): amitriptyline, escitalopram, ezetimibe, rabeprazole, sucralfate, testosterone cypionate, and chlorthalidone.  1.  Low back pain related to lumbar facet arthropathy, lumbar spondylosis: Bilateral L4-L5 and L5-S1 facet medial branch nerve block without sedation. 2.  Chronic migraine: Status post Botox which was not effective.  On monthly injectable Ajovy which is also not effective.  Discussed sphenopalatine ganglion nerve block for chronic migraines.  Risk and benefits reviewed and patient would like to proceed. 3.  Lumbar myofascial pain syndrome: Patient still experiencing relief from his trigger point injection.  Can repeat as needed.  Orders:  Orders Placed This Encounter  Procedures  . LUMBAR FACET(MEDIAL BRANCH NERVE BLOCK) MBNB    Standing Status:   Future    Standing Expiration Date:   03/21/2020    Scheduling Instructions:     Procedure: Lumbar facet block (AKA.: Lumbosacral medial branch nerve block)     Side: Bilateral     Level:  L4-5, & L5-S1 Facets (L4, L5,  Medial Branch Nerves)     Sedation: without     Timeframe: ASAA    Order Specific Question:   Where will this procedure be performed?    Answer:   [facility]  . SPHENOPALATINE NERVE BLOCK    Scheduling Instructions:     Side: Bilateral  Sedation: without     Timeframe: 2-3 weeks    Order Specific Question:   Where will this procedure be performed?    Answer:   [facility]  . Nursing communication    Scheduling Instructions:     SPG block CPT [phone] for chronic migraine, 1 hr     7.01.159 Sphenopalatine Ganglion Block for Headache          Schedule 1-2 weeks after L-fct block   Follow-up plan:   Return in  about 1 week (around 02/27/2020) for L4/5 and L5/S1 Fcts.     Status post Botox No. 1 on 01/01/2020 for migraine, lumbar TPI as well    Recent Visits Date Type Provider Dept  01/01/20 Procedure visit [name], MD [facility]  12/19/19 Office Visit [name], MD [facility]  Showing recent visits within past 90 days and meeting all other requirements   Today's Visits Date Type Provider Dept  02/20/20 Office Visit [name], MD [facility]  Showing today's visits and meeting all other requirements   Future Appointments Date Type Provider Dept  02/28/20 Appointment [name], MD [facility]  03/13/20 Appointment [name], MD [facility]  Showing future appointments within next 90 days and meeting all other requirements   I discussed the assessment and treatment plan with the patient. The patient was provided an opportunity to ask questions and all were answered. The patient agreed with the plan and demonstrated an understanding of the instructions.  Patient advised to call back or seek an in-person evaluation if the symptoms or condition worsens.  Duration of encounter: 35mnutes.  Note by: BGillis Santa MD Date: 02/20/2020; Time: 9:40 AM

## 2020-02-20 NOTE — Patient Instructions (Addendum)
1. Start with L-facet block for facet mediated low back pain 2. 1-2 weeks after, SPG block for migraine, 1 hr  ____________________________________________________________________________________________  Preparing for your procedure (without sedation)  Procedure appointments are limited to planned procedures:  No Prescription Refills.  No disability issues will be discussed.  No medication changes will be discussed.  Instructions:  Oral Intake: Do not eat or drink anything for at least 6 hours prior to your procedure. (Exception: Blood Pressure Medication. See below.)  Transportation: Unless otherwise stated by your physician, you may drive yourself after the procedure.  Blood Pressure Medicine: Do not forget to take your blood pressure medicine with a sip of water the morning of the procedure. If your Diastolic (lower reading)is above 100 mmHg, elective cases will be cancelled/rescheduled.  Blood thinners: These will need to be stopped for procedures. Notify our staff if you are taking any blood thinners. Depending on which one you take, there will be specific instructions on how and when to stop it.  Diabetics on insulin: Notify the staff so that you can be scheduled 1st case in the morning. If your diabetes requires high dose insulin, take only  of your normal insulin dose the morning of the procedure and notify the staff that you have done so.  Preventing infections: Shower with an antibacterial soap the morning of your procedure.   Build-up your immune system: Take 1000 mg of Vitamin C with every meal (3 times a day) the day prior to your procedure.  Antibiotics: Inform the staff if you have a condition or reason that requires you to take antibiotics before dental procedures.  Pregnancy: If you are pregnant, call and cancel the procedure.  Sickness: If you have a cold, fever, or any active infections, call and cancel the procedure.  Arrival: You must be in the facility at  least 30 minutes prior to your scheduled procedure.  Children: Do not bring any children with you.  Dress appropriately: Bring dark clothing that you would not mind if they get stained.  Valuables: Do not bring any jewelry or valuables.  Reasons to call and reschedule or cancel your procedure: (Following these recommendations will minimize the risk of a serious complication.)  Surgeries: Avoid having procedures within 2 weeks of any surgery. (Avoid for 2 weeks before or after any surgery).  Flu Shots: Avoid having procedures within 2 weeks of a flu shots or . (Avoid for 2 weeks before or after immunizations).  Barium: Avoid having a procedure within 7-10 days after having had a radiological study involving the use of radiological contrast. (Myelograms, Barium swallow or enema study).  Heart attacks: Avoid any elective procedures or surgeries for the initial 6 months after a "Myocardial Infarction" (Heart Attack).  Blood thinners: It is imperative that you stop these medications before procedures. Let us know if you if you take any blood thinner.   Infection: Avoid procedures during or within two weeks of an infection (including chest colds or gastrointestinal problems). Symptoms associated with infections include: Localized redness, fever, chills, night sweats or profuse sweating, burning sensation when voiding, cough, congestion, stuffiness, runny nose, sore throat, diarrhea, nausea, vomiting, cold or Flu symptoms, recent or current infections. It is specially important if the infection is over the area that we intend to treat.  Heart and lung problems: Symptoms that may suggest an active cardiopulmonary problem include: cough, chest pain, breathing difficulties or shortness of breath, dizziness, ankle swelling, uncontrolled high or unusually low blood pressure, and/or palpitations. If you  are experiencing any of these symptoms, cancel your procedure and contact your primary care physician for  an evaluation.  Remember:  Regular Business hours are:  Monday to Thursday 8:00 AM to 4:00 PM  Provider's Schedule: Milinda Pointer, MD:  Procedure days: Tuesday and Thursday 7:30 AM to 4:00 PM  [name], MD:  Procedure days: Monday and Wednesday 7:30 AM to 4:00 PM ____________________________________________________________________________________________

## 2020-02-20 NOTE — Progress Notes (Signed)
Safety precautions to be maintained throughout the outpatient stay will include: orient to surroundings, keep bed in low position, maintain call bell within reach at all times, provide assistance with transfer out of bed and ambulation.  

## 2020-02-28 ENCOUNTER — Encounter: Payer: Self-pay | Admitting: Student in an Organized Health Care Education/Training Program

## 2020-02-28 ENCOUNTER — Other Ambulatory Visit: Payer: Self-pay | Admitting: Neurology

## 2020-02-28 ENCOUNTER — Ambulatory Visit (HOSPITAL_BASED_OUTPATIENT_CLINIC_OR_DEPARTMENT_OTHER): Payer: Worker's Compensation | Admitting: Student in an Organized Health Care Education/Training Program

## 2020-02-28 ENCOUNTER — Other Ambulatory Visit: Payer: Self-pay

## 2020-02-28 ENCOUNTER — Ambulatory Visit
Admission: RE | Admit: 2020-02-28 | Discharge: 2020-02-28 | Disposition: A | Discharge status: Home / Self Care | Payer: Worker's Compensation | Source: Ambulatory Visit | Attending: Student in an Organized Health Care Education/Training Program | Admitting: Student in an Organized Health Care Education/Training Program

## 2020-02-28 DIAGNOSIS — R42 Dizziness and giddiness: Secondary | ICD-10-CM

## 2020-02-28 DIAGNOSIS — G894 Chronic pain syndrome: Secondary | ICD-10-CM | POA: Diagnosis present

## 2020-02-28 DIAGNOSIS — M47816 Spondylosis without myelopathy or radiculopathy, lumbar region: Secondary | ICD-10-CM | POA: Diagnosis present

## 2020-02-28 DIAGNOSIS — R519 Headache, unspecified: Secondary | ICD-10-CM

## 2020-02-28 MED ORDER — ROPIVACAINE HCL 2 MG/ML IJ SOLN
9.0000 mL | Freq: Once | INTRAMUSCULAR | Status: AC
  Administered 2020-02-28: 9 mL via PERINEURAL

## 2020-02-28 MED ORDER — LIDOCAINE HCL 2 % IJ SOLN
20.0000 mL | Freq: Once | INTRAMUSCULAR | Status: AC
  Administered 2020-02-28: 400 mg

## 2020-02-28 MED ORDER — DEXAMETHASONE SODIUM PHOSPHATE 10 MG/ML IJ SOLN
10.0000 mg | Freq: Once | INTRAMUSCULAR | Status: AC
  Administered 2020-02-28: 10 mg

## 2020-02-28 MED ORDER — LIDOCAINE HCL 2 % IJ SOLN
INTRAMUSCULAR | Status: AC
  Filled 2020-02-28: qty 20

## 2020-02-28 MED ORDER — ROPIVACAINE HCL 2 MG/ML IJ SOLN
INTRAMUSCULAR | Status: AC
  Filled 2020-02-28: qty 10

## 2020-02-28 MED ORDER — DEXAMETHASONE SODIUM PHOSPHATE 10 MG/ML IJ SOLN
INTRAMUSCULAR | Status: AC
  Filled 2020-02-28: qty 1

## 2020-02-28 NOTE — Progress Notes (Signed)
PROVIDER NOTE: Information contained herein reflects review and annotations entered in association with encounter. Interpretation of such information and data should be left to medically-trained personnel. Information provided to patient can be located elsewhere in the medical record under "Patient Instructions". Document created using STT-dictation technology, any transcriptional errors that may result from process are unintentional.    Patient: Grant Young  Service Category: Procedure  Provider: [name], MD  DOB: [birth year]  DOS: 02/28/2020  Location: [facility]  MRN: [id number]  Setting: Ambulatory - outpatient  Referring Provider: [name], MD  Type: Established Patient  Specialty: Interventional Pain Management  PCP: [name], NP   Primary Reason for Visit: Interventional Pain Management Treatment. CC: Back Pain  Procedure:          Anesthesia, Analgesia, Anxiolysis:  Type: Lumbar Facet, Medial Branch Block(s) #1  Primary Purpose: Therapeutic Region: Posterolateral Lumbosacral Spine Level: L3, L4, L5, Medial Branch Level(s). Injecting these levels blocks the L3-4, L4-5,  lumbar facet joints. Laterality: Bilateral  Type: Local Anesthesia  Local Anesthetic: Lidocaine 1-2%  Position: Prone   Indications: 1. Lumbar facet arthropathy   2. Chronic pain syndrome    Pain Score: Pre-procedure: 6 /10 Post-procedure: 3 /10   Pre-op Assessment:  [name] is a 74 y.o. (year old), male patient, seen today for interventional treatment. He  has a past surgical history that includes Kidney stone surgery (09/14/1980); Kidney stone surgery (09/15/1995); Lithotripsy (09/14/2014); Lithotripsy (09/14/1996); Lithotripsy (09/14/1997); Gallbladder surgery (09/15/2015); Sigmoidoscopy (09/14/2017); Colonoscopy (09/15/2015); Cholecystectomy (2017); Shoulder surgery (Right, 1984); Back surgery; Spinal cord stimulator implant; Pain pump implantation; Pain pump removal; and  Tonsillectomy. Mr. Pimenta has a current medication list which includes the following prescription(s): anastrozole, butalbital-aspirin-caffeine, chlorthalidone, vitamin b-12, digestive aids mixture, aimovig, escitalopram, ezetimibe, glutathione, hyoscyamine sulfate, loperamide hcl, lorazepam, magnesium bisglycinate, melatonin, misc natural products, multiple vitamins-minerals, nattokinase, ondansetron, OVER THE COUNTER MEDICATION, OVER THE COUNTER MEDICATION, papaya, prochlorperazine, propranolol, rabeprazole, sucralfate, testosterone cypionate, tizanidine, and amitriptyline. His primarily concern today is the Back Pain  Initial Vital Signs:  Pulse/HCG Rate: 97  Temp: 97.9 F (36.6 C) Resp: 18 BP: (!) 154/81 SpO2: 100 %  BMI: Estimated body mass index is 19.79 kg/m as calculated from the following:   Height as of this encounter: 5\' 9"  (1.753 m).   Weight as of this encounter: 134 lb (60.8 kg).  Risk Assessment: Allergies: Reviewed. He is allergic to ace inhibitors, fluoxetine, metoclopramide, nalbuphine, other, amoxicillin-pot clavulanate, doxazosin, duloxetine, penicillins, tamsulosin, trazodone and nefazodone, amlodipine, cinoxacin, ciprofloxacin, nebivolol, olanzapine, olmesartan, pregabalin, prostaglandins, thyroid hormones, zolpidem, duloxetine hcl, fluoxetine hcl, gabapentin, and phenytoin.  Allergy Precautions: None required Coagulopathies: Reviewed. None identified.  Blood-thinner therapy: None at this time Active Infection(s): Reviewed. None identified. Mr. Wenker is afebrile  Site Confirmation: Mr. Pierpoint was asked to confirm the procedure and laterality before marking the site Procedure checklist: Completed Consent: Before the procedure and under the influence of no sedative(s), amnesic(s), or anxiolytics, the patient was informed of the treatment options, risks and possible complications. To fulfill our ethical and legal obligations, as recommended by the American Medical Association's  Code of Ethics, I have informed the patient of my clinical impression; the nature and purpose of the treatment or procedure; the risks, benefits, and possible complications of the intervention; the alternatives, including doing nothing; the risk(s) and benefit(s) of the alternative treatment(s) or procedure(s); and the risk(s) and benefit(s) of doing nothing. The patient was provided information about the general risks and possible complications associated  with the procedure. These may include, but are not limited to: failure to achieve desired goals, infection, bleeding, organ or nerve damage, allergic reactions, paralysis, and death. In addition, the patient was informed of those risks and complications associated to Spine-related procedures, such as failure to decrease pain; infection (i.e.: Meningitis, epidural or intraspinal abscess); bleeding (i.e.: epidural hematoma, subarachnoid hemorrhage, or any other type of intraspinal or peri-dural bleeding); organ or nerve damage (i.e.: Any type of peripheral nerve, nerve root, or spinal cord injury) with subsequent damage to sensory, motor, and/or autonomic systems, resulting in permanent pain, numbness, and/or weakness of one or several areas of the body; allergic reactions; (i.e.: anaphylactic reaction); and/or death. Furthermore, the patient was informed of those risks and complications associated with the medications. These include, but are not limited to: allergic reactions (i.e.: anaphylactic or anaphylactoid reaction(s)); adrenal axis suppression; blood sugar elevation that in diabetics may result in ketoacidosis or comma; water retention that in patients with history of congestive heart failure may result in shortness of breath, pulmonary edema, and decompensation with resultant heart failure; weight gain; swelling or edema; medication-induced neural toxicity; particulate matter embolism and blood vessel occlusion with resultant organ, and/or nervous system  infarction; and/or aseptic necrosis of one or more joints. Finally, the patient was informed that Medicine is not an exact science; therefore, there is also the possibility of unforeseen or unpredictable risks and/or possible complications that may result in a catastrophic outcome. The patient indicated having understood very clearly. We have given the patient no guarantees and we have made no promises. Enough time was given to the patient to ask questions, all of which were answered to the patient's satisfaction. Mr. Midgley has indicated that he wanted to continue with the procedure. Attestation: I, the ordering provider, attest that I have discussed with the patient the benefits, risks, side-effects, alternatives, likelihood of achieving goals, and potential problems during recovery for the procedure that I have provided informed consent. Date  Time: 02/28/2020 10:13 AM  Pre-Procedure Preparation:  Monitoring: As per clinic protocol. Respiration, ETCO2, SpO2, BP, heart rate and rhythm monitor placed and checked for adequate function Safety Precautions: Patient was assessed for positional comfort and pressure points before starting the procedure. Time-out: I initiated and conducted the "Time-out" before starting the procedure, as per protocol. The patient was asked to participate by confirming the accuracy of the "Time Out" information. Verification of the correct person, site, and procedure were performed and confirmed by me, the nursing staff, and the patient. "Time-out" conducted as per Joint Commission's Universal Protocol (UP.01.01.01). Time: 1041  Description of Procedure:          Laterality: Bilateral. The procedure was performed in identical fashion on both sides. Levels:  L3, L4, L5,  Medial Branch Level(s) Area Prepped: Posterior Lumbosacral Region DuraPrep (Iodine Povacrylex [0.7% available iodine] and Isopropyl Alcohol, 74% w/w) Safety Precautions: Aspiration looking for blood return was  conducted prior to all injections. At no point did we inject any substances, as a needle was being advanced. Before injecting, the patient was told to immediately notify me if he was experiencing any new onset of "ringing in the ears, or metallic taste in the mouth". No attempts were made at seeking any paresthesias. Safe injection practices and needle disposal techniques used. Medications properly checked for expiration dates. SDV (single dose vial) medications used. After the completion of the procedure, all disposable equipment used was discarded in the proper designated medical waste containers. Local Anesthesia: Protocol guidelines were followed.  The patient was positioned over the fluoroscopy table. The area was prepped in the usual manner. The time-out was completed. The target area was identified using fluoroscopy. A 12-in long, straight, sterile hemostat was used with fluoroscopic guidance to locate the targets for each level blocked. Once located, the skin was marked with an approved surgical skin marker. Once all sites were marked, the skin (epidermis, dermis, and hypodermis), as well as deeper tissues (fat, connective tissue and muscle) were infiltrated with a small amount of a short-acting local anesthetic, loaded on a 10cc syringe with a 25G, 1.5-in  Needle. An appropriate amount of time was allowed for local anesthetics to take effect before proceeding to the next step. Local Anesthetic: Lidocaine 2.0% The unused portion of the local anesthetic was discarded in the proper designated containers. Technical explanation of process:   L3 Medial Branch Nerve Block (MBB): The target area for the L3 medial branch is at the junction of the postero-lateral aspect of the superior articular process and the superior, posterior, and medial edge of the transverse process of L4. Under fluoroscopic guidance, a Quincke needle was inserted until contact was made with os over the superior postero-lateral aspect of  the pedicular shadow (target area). After negative aspiration for blood, 1.5 mL of the nerve block solution was injected without difficulty or complication. The needle was removed intact. L4 Medial Branch Nerve Block (MBB): The target area for the L4 medial branch is at the junction of the postero-lateral aspect of the superior articular process and the superior, posterior, and medial edge of the transverse process of L5. Under fluoroscopic guidance, a Quincke needle was inserted until contact was made with os over the superior postero-lateral aspect of the pedicular shadow (target area). After negative aspiration for blood, 1.5 mL of the nerve block solution was injected without difficulty or complication. The needle was removed intact. L5 Medial Branch Nerve Block (MBB): The target area for the L5 medial branch is at the junction of the postero-lateral aspect of the superior articular process and the superior, posterior, and medial edge of the sacral ala. Under fluoroscopic guidance, a Quincke needle was inserted until contact was made with os over the superior postero-lateral aspect of the pedicular shadow (target area). After negative aspiration for blood, 1.64mL of the nerve block solution was injected without difficulty or complication. The needle was removed intact. Nerve block solution: 10 cc solution made of 8 cc of 0.2% ropivacaine, 2 cc of Decadron 10 mg/cc.  1.5 cc injected at each level above bilaterally.  The unused portion of the solution was discarded in the proper designated containers. Procedural Needles: 22-gauge, 3.5-inch, Quincke needles used for all levels.  Once the entire procedure was completed, the treated area was cleaned, making sure to leave some of the prepping solution back to take advantage of its long term bactericidal properties.   Illustration of the posterior view of the lumbar spine and the posterior neural structures. Laminae of L2 through S1 are labeled. DPRL5, dorsal  primary ramus of L5; DPRS1, dorsal primary ramus of S1; DPR3, dorsal primary ramus of L3; FJ, facet (zygapophyseal) joint L3-L4; I, inferior articular process of L4; LB1, lateral branch of dorsal primary ramus of L1; IAB, inferior articular branches from L3 medial branch (supplies L4-L5 facet joint); IBP, intermediate branch plexus; MB3, medial branch of dorsal primary ramus of L3; NR3, third lumbar nerve root; S, superior articular process of L5; SAB, superior articular branches from L4 (supplies L4-5 facet joint also); TP3, transverse  process of L3.  Vitals:   02/28/20 1030 02/28/20 1040 02/28/20 1050 02/28/20 1055  BP: (!) 171/75 (!) 153/70 (!) 163/73 (!) 156/73  Pulse: 61 61 (!) 56 60  Resp: 16 17 17 16   Temp:      SpO2: 100% 100% 99% 98%  Weight:      Height:         Start Time: 1041 hrs. End Time: 1051 hrs.  Imaging Guidance (Spinal):          Type of Imaging Technique: Fluoroscopy Guidance (Spinal) Indication(s): Assistance in needle guidance and placement for procedures requiring needle placement in or near specific anatomical locations not easily accessible without such assistance. Exposure Time: Please see nurses notes. Contrast: None used. Fluoroscopic Guidance: I was personally present during the use of fluoroscopy. "Tunnel Vision Technique" used to obtain the best possible view of the target area. Parallax error corrected before commencing the procedure. "Direction-depth-direction" technique used to introduce the needle under continuous pulsed fluoroscopy. Once target was reached, antero-posterior, oblique, and lateral fluoroscopic projection used confirm needle placement in all planes. Images permanently stored in EMR. Interpretation: No contrast injected. I personally interpreted the imaging intraoperatively. Adequate needle placement confirmed in multiple planes. Permanent images saved into the patient's record.   Post-operative Assessment:  Post-procedure Vital Signs:   Pulse/HCG Rate: 60  Temp: 97.9 F (36.6 C) Resp: 16 BP: (!) 156/73 SpO2: 98 %  EBL: None  Complications: No immediate post-treatment complications observed by team, or reported by patient.  Note: The patient tolerated the entire procedure well. A repeat set of vitals were taken after the procedure and the patient was kept under observation following institutional policy, for this type of procedure. Post-procedural neurological assessment was performed, showing return to baseline, prior to discharge. The patient was provided with post-procedure discharge instructions, including a section on how to identify potential problems. Should any problems arise concerning this procedure, the patient was given instructions to immediately contact us, at any time, without hesitation. In any case, we plan to contact the patient by telephone for a follow-up status report regarding this interventional procedure.  Comments:  No additional relevant information.  Plan of Care  Orders:  Orders Placed This Encounter  Procedures  . [facility] C-ARM 1-60 MIN NO REPORT    Intraoperative interpretation by procedural physician at [place].    Standing Status:   Standing    Number of Occurrences:   1    Order Specific Question:   Reason for exam:    Answer:   Assistance in needle guidance and placement for procedures requiring needle placement in or near specific anatomical locations not easily accessible without such assistance.    Medications ordered for procedure: Meds ordered this encounter  Medications  . lidocaine (XYLOCAINE) 2 % (with pres) injection 400 mg  . ropivacaine (PF) 2 mg/mL (0.2%) (NAROPIN) injection 9 mL  . ropivacaine (PF) 2 mg/mL (0.2%) (NAROPIN) injection 9 mL  . dexamethasone (DECADRON) injection 10 mg  . dexamethasone (DECADRON) injection 10 mg   Medications administered: We administered lidocaine, ropivacaine (PF) 2 mg/mL (0.2%), ropivacaine (PF) 2 mg/mL (0.2%),  dexamethasone, and dexamethasone.  See the medical record for exact dosing, route, and time of administration.  Follow-up plan:   Return in about 6 weeks (around 04/10/2020) for Post Procedure Evaluation.      Status post Botox No. 1 on 01/01/2020 for migraine, lumbar TPI as well     Recent Visits Date Type Provider Dept  02/20/20 Office Visit [name], MD [facility]  01/01/20 Procedure visit [name], MD [facility]  12/19/19 Office Visit [name], MD [facility]  Showing recent visits within past 90 days and meeting all other requirements Today's Visits Date Type Provider Dept  02/28/20 Procedure visit [name], MD [facility]  Showing today's visits and meeting all other requirements Future Appointments Date Type Provider Dept  03/13/20 Appointment [name], MD [facility]  04/11/20 Appointment [name], MD [facility]  Showing future appointments within next 90 days and meeting all other requirements  Disposition: Discharge home  Discharge (Date  Time): 02/28/2020;   hrs.   Primary Care Physician: [name], NP Location: [facility] Note by: [name], MD Date: 02/28/2020; Time: 1:28 PM  Disclaimer:  Medicine is not an exact science. The only guarantee in medicine is that nothing is guaranteed. It is important to note that the decision to proceed with this intervention was based on the information collected from the patient. The Data and conclusions were drawn from the patient's questionnaire, the interview, and the physical examination. Because the information was provided in large part by the patient, it cannot be guaranteed that it has not been purposely or unconsciously manipulated. Every effort has been made to obtain as much relevant data as possible for this evaluation. It is important to note that the conclusions that lead to this procedure are  derived in large part from the available data. Always take into account that the treatment will also be dependent on availability of resources and existing treatment guidelines, considered by other Pain Management Practitioners as being common knowledge and practice, at the time of the intervention. For Medico-Legal purposes, it is also important to point out that variation in procedural techniques and pharmacological choices are the acceptable norm. The indications, contraindications, technique, and results of the above procedure should only be interpreted and judged by a Board-Certified Interventional Pain Specialist with extensive familiarity and expertise in the same exact procedure and technique.

## 2020-02-28 NOTE — Progress Notes (Signed)
Safety precautions to be maintained throughout the outpatient stay will include: orient to surroundings, keep bed in low position, maintain call bell within reach at all times, provide assistance with transfer out of bed and ambulation.  

## 2020-02-28 NOTE — Patient Instructions (Signed)
____________________________________________________________________________________________  Post-Procedure Discharge Instructions  Instructions:  Apply ice:   Purpose: This will minimize any swelling and discomfort after procedure.   When: Day of procedure, as soon as you get home.  How: Fill a plastic sandwich bag with crushed ice. Cover it with a small towel and apply to injection site.  How long: (15 min on, 15 min off) Apply for 15 minutes then remove x 15 minutes.  Repeat sequence on day of procedure, until you go to bed.  Apply heat:   Purpose: To treat any soreness and discomfort from the procedure.  When: Starting the next day after the procedure.  How: Apply heat to procedure site starting the day following the procedure.  How long: May continue to repeat daily, until discomfort goes away.  Food intake: Start with clear liquids (like water) and advance to regular food, as tolerated.   Physical activities: Keep activities to a minimum for the first 8 hours after the procedure. After that, then as tolerated.  Driving: If you have received any sedation, be responsible and do not drive. You are not allowed to drive for 24 hours after having sedation.  Blood thinner: (Applies only to those taking blood thinners) You may restart your blood thinner 6 hours after your procedure.  Insulin: (Applies only to Diabetic patients taking insulin) As soon as you can eat, you may resume your normal dosing schedule.  Infection prevention: Keep procedure site clean and dry. Shower daily and clean area with soap and water.  Post-procedure Pain Diary: Extremely important that this be done correctly and accurately. Recorded information will be used to determine the next step in treatment. For the purpose of accuracy, follow these rules:  Evaluate only the area treated. Do not report or include pain from an untreated area. For the purpose of this evaluation, ignore all other areas of pain,  except for the treated area.  After your procedure, avoid taking a long nap and attempting to complete the pain diary after you wake up. Instead, set your alarm clock to go off every hour, on the hour, for the initial 8 hours after the procedure. Document the duration of the numbing medicine, and the relief you are getting from it.  Do not go to sleep and attempt to complete it later. It will not be accurate. If you received sedation, it is likely that you were given a medication that may cause amnesia. Because of this, completing the diary at a later time may cause the information to be inaccurate. This information is needed to plan your care.  Follow-up appointment: Keep your post-procedure follow-up evaluation appointment after the procedure (usually 2 weeks for most procedures, 6 weeks for radiofrequencies). DO NOT FORGET to bring you pain diary with you.   Expect: (What should I expect to see with my procedure?)  From numbing medicine (AKA: Local Anesthetics): Numbness or decrease in pain. You may also experience some weakness, which if present, could last for the duration of the local anesthetic.  Onset: Full effect within 15 minutes of injected.  Duration: It will depend on the type of local anesthetic used. On the average, 1 to 8 hours.   From steroids (Applies only if steroids were used): Decrease in swelling or inflammation. Once inflammation is improved, relief of the pain will follow.  Onset of benefits: Depends on the amount of swelling present. The more swelling, the longer it will take for the benefits to be seen. In some cases, up to 10 days.    Duration: Steroids will stay in the system x 2 weeks. Duration of benefits will depend on multiple posibilities including persistent irritating factors.  Side-effects: If present, they may typically last 2 weeks (the duration of the steroids).  Frequent: Cramps (if they occur, drink Gatorade and take over-the-counter Magnesium 450-500 mg  once to twice a day); water retention with temporary weight gain; increases in blood sugar; decreased immune system response; increased appetite.  Occasional: Facial flushing (red, warm cheeks); mood swings; menstrual changes.  Uncommon: Long-term decrease or suppression of natural hormones; bone thinning. (These are more common with higher doses or more frequent use. This is why we prefer that our patients avoid having any injection therapies in other practices.)   Very Rare: Severe mood changes; psychosis; aseptic necrosis.  From procedure: Some discomfort is to be expected once the numbing medicine wears off. This should be minimal if ice and heat are applied as instructed.  Call if: (When should I call?)  You experience numbness and weakness that gets worse with time, as opposed to wearing off.  New onset bowel or bladder incontinence. (Applies only to procedures done in the spine)  Emergency Numbers:  Durning business hours (Monday - Thursday, 8:00 AM - 4:00 PM) (Friday, 9:00 AM - 12:00 Noon): [phone]  After hours: [phone]  NOTE: If you are having a problem and are unable connect with, or to talk to a provider, then go to your nearest urgent care or emergency department. If the problem is serious and urgent, please call 911. ____________________________________________________________________________________________   Facet Joint Block The facet joints connect the bones of the spine (vertebrae). They make it possible for you to bend, twist, and make other movements with your spine. They also keep you from bending too far, twisting too far, and making other extreme movements. A facet joint block is a procedure in which a numbing medicine (anesthetic) is injected into a facet joint. In many cases, an anti-inflammatory medicine (steroid) is also injected. A facet joint block may be done:  To diagnose neck or back pain. If the pain gets better after a facet joint block,  it means the pain is probably coming from the facet joint. If the pain does not get better, it means the pain is probably not coming from the facet joint.  To relieve neck or back pain that is caused by an inflamed facet joint. A facet joint block is only done to relieve pain if the pain does not improve with other methods, such as medicine, exercise programs, and physical therapy. Tell a health care provider about:  Any allergies you have.  All medicines you are taking, including vitamins, herbs, eye drops, creams, and over-the-counter medicines.  Any problems you or family members have had with anesthetic medicines.  Any blood disorders you have.  Any surgeries you have had.  Any medical conditions you have or have had.  Whether you are pregnant or may be pregnant. What are the risks? Generally, this is a safe procedure. However, problems may occur, including:  Bleeding.  Injury to a nerve near the injection site.  Pain at the injection site.  Weakness or numbness in areas controlled by nerves near the injection site.  Infection.  Temporary fluid retention.  Allergic reactions to medicines or dyes.  Injury to other structures or organs near the injection site. What happens before the procedure? Medicines Ask your health care provider about:  Changing or stopping your regular medicines. This is especially important if you   are taking diabetes medicines or blood thinners.  Taking medicines such as aspirin and ibuprofen. These medicines can thin your blood. Do not take these medicines unless your health care provider tells you to take them.  Taking over-the-counter medicines, vitamins, herbs, and supplements. Eating and drinking Follow instructions from your health care provider about eating and drinking, which may include:  8 hours before the procedure - stop eating heavy meals or foods, such as meat, fried foods, or fatty foods.  6 hours before the procedure - stop  eating light meals or foods, such as toast or cereal.  6 hours before the procedure - stop drinking milk or drinks that contain milk.  2 hours before the procedure - stop drinking clear liquids. Staying hydrated Follow instructions from your health care provider about hydration, which may include:  Up to 2 hours before the procedure - you may continue to drink clear liquids, such as water, clear fruit juice, black coffee, and plain tea. General instructions  Do not use any products that contain nicotine or tobacco for at least 4-6 weeks before the procedure. These products include cigarettes, e-cigarettes, and chewing tobacco. If you need help quitting, ask your health care provider.  Plan to have someone take you home from the hospital or clinic.  Ask your health care provider: ? How your surgery site will be marked. ? What steps will be taken to help prevent infection. These may include:  Removing hair at the surgery site.  Washing skin with a germ-killing soap.  Receiving antibiotic medicine. What happens during the procedure?   You will put on a hospital gown.  You will lie on your stomach on an X-ray table. You may be asked to lie in a different position if an injection will be made in your neck.  Machines will be used to monitor your oxygen levels, heart rate, and blood pressure.  Your skin will be cleaned.  If an injection will be made in your neck, an IV will be inserted into one of your veins. Fluids and medicine will flow directly into your body through the IV.  A numbing medicine (local anesthetic) will be applied to your skin. Your skin may sting or burn for a moment.  A video X-ray machine (fluoroscopy) will be used to find the joint. In some cases, a CT scan may be used.  A contrast dye may be injected into the facet joint area to help find the joint.  When the joint is located, an anesthetic will be injected into the joint through the needle.  Your health  care provider will ask you whether you feel pain relief. ? If you feel relief, a steroid may be injected to provide pain relief for a longer period of time. ? If you do not feel relief or feel only partial relief, additional injections of an anesthetic may be made in other facet joints.  The needle will be removed.  Your skin will be cleaned.  A bandage (dressing) will be applied over each injection site. The procedure may vary among health care providers and hospitals. What happens after the procedure?  Your blood pressure, heart rate, breathing rate, and blood oxygen level will be monitored until you leave the hospital or clinic.  You will lie down and rest for a period of time. Summary  A facet joint block is a procedure in which a numbing medicine (anesthetic) is injected into a facet joint. An anti-inflammatory medicine (stereoid) may also be injected.  Follow   instructions from your health care provider about medicines and eating and drinking before the procedure.  Do not use any products that contain nicotine or tobacco for at least 4-6 weeks before the procedure.  You will lie on your stomach for the procedure, but you may be asked to lie in a different position if an injection will be made in your neck.  When the joint is located, an anesthetic will be injected into the joint through the needle. This information is not intended to replace advice given to you by your health care provider. Make sure you discuss any questions you have with your health care provider. Document Revised: 12/22/2018 Document Reviewed: 08/05/2018 [employer] Patient Education  2020 [employer]  

## 2020-03-05 ENCOUNTER — Telehealth: Payer: Self-pay | Admitting: Nurse Practitioner

## 2020-03-05 NOTE — Telephone Encounter (Signed)
Yes I am his PCP so as long as he is just seeing me in regards to primary care this should not be an issue.

## 2020-03-05 NOTE — Telephone Encounter (Signed)
Called to speak with Grant Young about his ENT referral this morning, when were done he asked if I could make sure that Grant Young had "full" control of writing/subcribing his meds.  He's noticed in his my chart/EPIC that his previous MD(Dr Ola Spurr) has made documentation regarding meds(recently) & he wants all of his medical care to be with Grant Young.  Thanks, Vilinda Blanks.

## 2020-03-06 ENCOUNTER — Other Ambulatory Visit: Payer: Self-pay

## 2020-03-06 ENCOUNTER — Ambulatory Visit
Admission: RE | Admit: 2020-03-06 | Discharge: 2020-03-06 | Disposition: A | Discharge status: Home / Self Care | Payer: Medicare Other | Source: Ambulatory Visit | Attending: Neurology | Admitting: Neurology

## 2020-03-06 DIAGNOSIS — R42 Dizziness and giddiness: Secondary | ICD-10-CM | POA: Diagnosis present

## 2020-03-06 DIAGNOSIS — R519 Headache, unspecified: Secondary | ICD-10-CM | POA: Diagnosis present

## 2020-03-06 HISTORY — DX: Basal cell carcinoma of skin, unspecified: C44.91

## 2020-03-06 HISTORY — DX: Squamous cell carcinoma of skin, unspecified: C44.92

## 2020-03-06 LAB — POCT I-STAT CREATININE: Creatinine, Ser: 0.9 mg/dL (ref 0.61–1.24)

## 2020-03-06 MED ORDER — IOHEXOL 350 MG/ML SOLN
75.0000 mL | Freq: Once | INTRAVENOUS | Status: AC | PRN
  Administered 2020-03-06: 75 mL via INTRAVENOUS

## 2020-03-13 ENCOUNTER — Encounter: Payer: Self-pay | Admitting: Student in an Organized Health Care Education/Training Program

## 2020-03-13 ENCOUNTER — Ambulatory Visit
Payer: Medicare Other | Attending: Student in an Organized Health Care Education/Training Program | Admitting: Student in an Organized Health Care Education/Training Program

## 2020-03-13 ENCOUNTER — Other Ambulatory Visit: Payer: Self-pay

## 2020-03-13 VITALS — BP 151/79 | HR 52 | Resp 18 | Ht 69.0 in | Wt 136.0 lb

## 2020-03-13 DIAGNOSIS — G43701 Chronic migraine without aura, not intractable, with status migrainosus: Secondary | ICD-10-CM

## 2020-03-13 DIAGNOSIS — G894 Chronic pain syndrome: Secondary | ICD-10-CM | POA: Diagnosis present

## 2020-03-13 MED ORDER — LIDOCAINE VISCOUS HCL 2 % MT SOLN
15.0000 mL | Freq: Once | OROMUCOSAL | Status: DC
  Filled 2020-03-13: qty 15

## 2020-03-13 MED ORDER — LIDOCAINE HCL URETHRAL/MUCOSAL 2 % EX GEL
CUTANEOUS | Status: AC
  Filled 2020-03-13: qty 5

## 2020-03-13 MED ORDER — ROPIVACAINE HCL 2 MG/ML IJ SOLN
INTRAMUSCULAR | Status: AC
  Filled 2020-03-13: qty 10

## 2020-03-13 MED ORDER — ROPIVACAINE HCL 2 MG/ML IJ SOLN: 9.0000 mL | Freq: Once | INTRAMUSCULAR | Status: DC

## 2020-03-13 NOTE — Progress Notes (Signed)
PROVIDER NOTE: Information contained herein reflects review and annotations entered in association with encounter. Interpretation of such information and data should be left to medically-trained personnel. Information provided to patient can be located elsewhere in the medical record under "Patient Instructions". Document created using STT-dictation technology, any transcriptional errors that may result from process are unintentional.    Patient: Grant Young  Service Category: Procedure  Provider: [name], MD  DOB: Jun 11, 1946  DOS: 03/13/2020  Location: [facility]  MRN: [id number]  Setting: Ambulatory - outpatient  Referring Provider: [name], NP  Type: Established Patient  Specialty: Interventional Pain Management  PCP: [name], NP   Primary Reason for Visit: Interventional Pain Management Treatment. CC: Headache and Back Pain  Procedure:          Anesthesia, Analgesia, Anxiolysis:  SPHENOPALATINE GANGLION BLOCK Indication: chronic migraines  Type: Local Anesthesia Indication(s): Analgesia         Local Anesthetic: Lidocaine 1-2% Route: Infiltration (Morgan/IM) IV Access: Declined Sedation: Declined   Position: Supine   Indications: 1. 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 Chronic pain syndrome    Pain Score: Pre-procedure: 8 /10 Post-procedure: 7 /10   Pre-op Assessment:  Grant Young is a 74 y.o. (year old), male patient, seen today for interventional treatment. He  has a past surgical history that includes Kidney stone surgery (09/14/1980); Kidney stone surgery (09/15/1995); Lithotripsy (09/14/2014); Lithotripsy (09/14/1996); Lithotripsy (09/14/1997); Gallbladder surgery (09/15/2015); Sigmoidoscopy (09/14/2017); Colonoscopy (09/15/2015); Cholecystectomy (2017); Shoulder surgery (Right, 1984); Back surgery; Spinal cord stimulator implant; Pain pump implantation; Pain pump removal; and Tonsillectomy. Grant Young  has a current medication list which includes the following prescription(s): amitriptyline, anastrozole, butalbital-aspirin-caffeine, chlorthalidone, vitamin b-12, digestive aids mixture, aimovig, escitalopram, ezetimibe, glutathione, hyoscyamine sulfate, loperamide hcl, lorazepam, magnesium bisglycinate, melatonin, misc natural products, multiple vitamins-minerals, nattokinase, ondansetron, OVER THE COUNTER MEDICATION, OVER THE COUNTER MEDICATION, papaya, prochlorperazine, propranolol, rabeprazole, sucralfate, testosterone cypionate, and tizanidine, and the following Facility-Administered Medications: lidocaine and ropivacaine (pf) 2 mg/ml (0.2%). His primarily concern today is the Headache and Back Pain  Initial Vital Signs:  Pulse/HCG Rate: (!) 56  Temp:   Resp: 18 BP: (!) 175/72 SpO2: 100 %  BMI: Estimated body mass index is 20.08 kg/m as calculated from the following:   Height as of this encounter: '5\' 9"'$  (1.753 m).   Weight as of this encounter: 136 lb (61.7 kg).  Risk Assessment: Allergies: Reviewed. He is allergic to ace inhibitors, fluoxetine, metoclopramide, nalbuphine, other, amoxicillin-pot clavulanate, doxazosin, duloxetine, penicillins, tamsulosin, trazodone and nefazodone, amlodipine, cinoxacin, ciprofloxacin, nebivolol, olanzapine, olmesartan, pregabalin, prostaglandins, thyroid hormones, zolpidem, duloxetine hcl, fluoxetine hcl, gabapentin, and phenytoin.  Allergy Precautions: None required Coagulopathies: Reviewed. None identified.  Blood-thinner therapy: None at this time Active Infection(s): Reviewed. None identified. Grant Young is afebrile  Site Confirmation: Grant Young was asked to confirm the procedure and laterality before marking the site Procedure checklist: Completed Consent: Before the procedure and under the influence of no sedative(s), amnesic(s), or anxiolytics, the patient was informed of the treatment options, risks and possible complications. To fulfill our ethical  and legal obligations, as recommended by the American Medical Association's Code of Ethics, I have informed the patient of my clinical impression; the nature and purpose of the treatment or procedure; the risks, benefits, and possible complications of the intervention; the alternatives, including doing nothing; the risk(s) and benefit(s) of the alternative treatment(s) or procedure(s); and the risk(s) and benefit(s) of doing nothing. The patient  was provided information about the general risks and possible complications associated with the procedure. These may include, but are not limited to: failure to achieve desired goals, infection, bleeding, organ or nerve damage, allergic reactions, paralysis, and death. In addition, the patient was informed of those risks and complications associated to the procedure, such as failure to decrease pain; infection; bleeding; organ or nerve damage with subsequent damage to sensory, motor, and/or autonomic systems, resulting in permanent pain, numbness, and/or weakness of one or several areas of the body; allergic reactions; (i.e.: anaphylactic reaction); and/or death. Furthermore, the patient was informed of those risks and complications associated with the medications. These include, but are not limited to: allergic reactions (i.e.: anaphylactic or anaphylactoid reaction(s)); adrenal axis suppression; blood sugar elevation that in diabetics may result in ketoacidosis or comma; water retention that in patients with history of congestive heart failure may result in shortness of breath, pulmonary edema, and decompensation with resultant heart failure; weight gain; swelling or edema; medication-induced neural toxicity; particulate matter embolism and blood vessel occlusion with resultant organ, and/or nervous system infarction; and/or aseptic necrosis of one or more joints. Finally, the patient was informed that Medicine is not an exact science; therefore, there is also the  possibility of unforeseen or unpredictable risks and/or possible complications that may result in a catastrophic outcome. The patient indicated having understood very clearly. We have given the patient no guarantees and we have made no promises. Enough time was given to the patient to ask questions, all of which were answered to the patient's satisfaction. Grant Young has indicated that he wanted to continue with the procedure. Attestation: I, the ordering provider, attest that I have discussed with the patient the benefits, risks, side-effects, alternatives, likelihood of achieving goals, and potential problems during recovery for the procedure that I have provided informed consent. Date  Time: 03/13/2020 10:07 AM  Pre-Procedure Preparation:  Monitoring: As per clinic protocol. Respiration, ETCO2, SpO2, BP, heart rate and rhythm monitor placed and checked for adequate function Safety Precautions: Patient was assessed for positional comfort and pressure points before starting the procedure. Time-out: I initiated and conducted the "Time-out" before starting the procedure, as per protocol. The patient was asked to participate by confirming the accuracy of the "Time Out" information. Verification of the correct person, site, and procedure were performed and confirmed by me, the nursing staff, and the patient. "Time-out" conducted as per Joint Commission's Universal Protocol (UP.01.01.01). Time: 1026  Description of Procedure:          10-cm cotton-tipped applicator  1. cotton-tipped applicator soaked in the anesthetic (mixture of 2% lidocaine gel and 0.2% ropivacaine). 2. Patient was placed in the sniffing position in the stretcher 3. The 10 cm cotton-tipped applicator after being soaked in the mixture of lidocaine gel and ropivacaine was inserted into the nares bilaterally with a vertical orientation directed towards his posterior nasal cavity 4. Using steady pressure, the 10 cm cotton-tipped applicator  was advanced until resistance was met at the posterior wall of the nasopharynx. 5. Applicator was left in place in bilateral naris for 45 minutes 6.  Using a plastic portion of an IV catheter, additional drops of anesthetic were added to run alongside the applicator down to the cotton swab     Vitals:   03/13/20 1007 03/13/20 1009 03/13/20 1104  BP:  (!) 175/72 (!) 151/79  Pulse:  (!) 56 (!) 52  Resp:  18   SpO2:  100% 99%  Weight: 136 lb (61.7 kg)  Height: '5\' 9"'$  (1.753 m)      Start Time: 1028 hrs. End Time:   hrs. Materials:     Post-operative Assessment:  Post-procedure Vital Signs:  Pulse/HCG Rate: (!) 52  Temp:   Resp: 18 BP: (!) 151/79 SpO2: 99 %  EBL: None  Complications: No immediate post-treatment complications observed by team, or reported by patient.  Note: The patient tolerated the entire procedure well. A repeat set of vitals were taken after the procedure and the patient was kept under observation following institutional policy, for this type of procedure. Post-procedural neurological assessment was performed, showing return to baseline, prior to discharge. The patient was provided with post-procedure discharge instructions, including a section on how to identify potential problems. Should any problems arise concerning this procedure, the patient was given instructions to immediately contact us, at any time, without hesitation. In any case, we plan to contact the patient by telephone for a follow-up status report regarding this interventional procedure.  Comments:  No additional relevant information.  Plan of Care  Orders:  Orders Placed This Encounter  Procedures  . GREATER OCCIPITAL NERVE BLOCK    Standing Status:   Future    Standing Expiration Date:   06/13/2020    Scheduling Instructions:     Procedure: Occipital nerve block     Laterality: Bilateral     Sedation: without     Timeframe: ASAA    Order Specific Question:   Where will this procedure  be performed?    Answer:   [facility]    Medications ordered for procedure: Meds ordered this encounter  Medications  . lidocaine (XYLOCAINE) 2 % viscous mouth solution 15 mL    To be used for MD procedure: SPG block for migraines  . ropivacaine (PF) 2 mg/mL (0.2%) (NAROPIN) injection 9 mL   Medications administered: [name] had no medications administered during this visit.  See the medical record for exact dosing, route, and time of administration.  Follow-up plan:   Return in about 3 weeks (around 04/03/2020) for B/L GONB.      Status post Botox No. 1 on 01/01/2020 for migraine, lumbar TPI as well. SPG block 03/13/20      Recent Visits Date Type Provider Dept  02/28/20 Procedure visit [name], MD [facility]  02/20/20 Office Visit [name], MD [facility]  01/01/20 Procedure visit [name], MD [facility]  12/19/19 Office Visit [name], MD [facility]  Showing recent visits within past 90 days and meeting all other requirements Today's Visits Date Type Provider Dept  03/13/20 Procedure visit [name], MD [facility]  Showing today's visits and meeting all other requirements Future Appointments Date Type Provider Dept  04/01/20 Appointment [name], MD [facility]  04/11/20 Appointment [name], MD [facility]  Showing future appointments within next 90 days and meeting all other requirements  Disposition: Discharge home  Discharge (Date  Time): 03/13/2020; 1140 hrs.   Primary Care Physician: [name], NP Location: [facility] Note by: [name], MD Date: 03/13/2020; Time: 1:19 PM  Disclaimer:  Medicine is not an exact science. The only guarantee in medicine is that nothing is guaranteed. It is important to note that the decision to proceed with this intervention was based on the information collected from the  patient. The Data and conclusions were drawn from the patient's questionnaire, the interview, and the physical examination. Because the information  was provided in large part by the patient, it cannot be guaranteed that it has not been purposely or unconsciously manipulated. Every effort has been made to obtain as much relevant data as possible for this evaluation. It is important to note that the conclusions that lead to this procedure are derived in large part from the available data. Always take into account that the treatment will also be dependent on availability of resources and existing treatment guidelines, considered by other Pain Management Practitioners as being common knowledge and practice, at the time of the intervention. For Medico-Legal purposes, it is also important to point out that variation in procedural techniques and pharmacological choices are the acceptable norm. The indications, contraindications, technique, and results of the above procedure should only be interpreted and judged by a Board-Certified Interventional Pain Specialist with extensive familiarity and expertise in the same exact procedure and technique.

## 2020-03-13 NOTE — Progress Notes (Signed)
Safety precautions to be maintained throughout the outpatient stay will include: orient to surroundings, keep bed in low position, maintain call bell within reach at all times, provide assistance with transfer out of bed and ambulation.  

## 2020-03-13 NOTE — Patient Instructions (Signed)
Preparing for your procedure (without sedation) Instructions: . Oral Intake: Do not eat or drink anything for at least 3 hours prior to your procedure. . Transportation: Unless otherwise stated by your physician, you may drive yourself after the procedure. . Blood Pressure Medicine: Take your blood pressure medicine with a sip of water the morning of the procedure. . Insulin: Take only  of your normal insulin dose. . Preventing infections: Shower with an antibacterial soap the morning of your procedure. . Build-up your immune system: Take 1000 mg of Vitamin C with every meal (3 times a day) the day prior to your procedure. . Pregnancy: If you are pregnant, call and cancel the procedure. . Sickness: If you have a cold, fever, or any active infections, call and cancel the procedure. . Arrival: You must be in the facility at least 30 minutes prior to your scheduled procedure. . Children: Do not bring any children with you. . Dress appropriately: Bring dark clothing that you would not mind if they get stained. . Valuables: Do not bring any jewelry or valuables. Procedure appointments are reserved for interventional treatments only. Marland Kitchen No Prescription Refills. . No medication changes will be discussed during procedure appointments. . No disability issues will be discussed. Preparing for Procedure with Sedation Instructions: Oral Intake: Do not eat or drink anything for at least 8 hours prior to your procedure. Transportation: Public transportation is not allowed. Bring an adult driver. The driver must be physically present in our waiting room before any procedure can be started. Physical Assistance: Bring an adult capable of physically assisting you, in the event you need help. Blood Pressure Medicine: Take your blood pressure medicine with a sip of water the morning of the procedure. Insulin: Take only  of your normal insulin dose. Preventing infections: Shower with an antibacterial soap the  morning of your procedure. Build-up your immune system: Take 1000 mg of Vitamin C with every meal (3 times a day) the day prior to your procedure. Pregnancy: If you are pregnant, call and cancel the procedure. Sickness: If you have a cold, fever, or any active infections, call and cancel the procedure. Arrival: You must be in the facility at least 30 minutes prior to your scheduled procedure. Children: Do not bring children with you. Dress appropriately: Bring dark clothing that you would not mind if they get stained. Valuables: Do not bring any jewelry or valuables. Procedure appointments are reserved for interventional treatments only. No Prescription Refills. No medication changes will be discussed during procedure appointments. No disability issues will be discussed.

## 2020-03-18 HISTORY — PX: FOOT SURGERY: SHX648

## 2020-03-20 DIAGNOSIS — R42 Dizziness and giddiness: Secondary | ICD-10-CM | POA: Insufficient documentation

## 2020-03-21 ENCOUNTER — Ambulatory Visit: Payer: Medicare Other | Admitting: Nurse Practitioner

## 2020-03-22 ENCOUNTER — Ambulatory Visit: Payer: Medicare Other | Admitting: Cardiology

## 2020-04-01 ENCOUNTER — Other Ambulatory Visit: Payer: Self-pay

## 2020-04-01 ENCOUNTER — Ambulatory Visit
Payer: Worker's Compensation | Attending: Student in an Organized Health Care Education/Training Program | Admitting: Student in an Organized Health Care Education/Training Program

## 2020-04-01 ENCOUNTER — Encounter: Payer: Self-pay | Admitting: Student in an Organized Health Care Education/Training Program

## 2020-04-01 VITALS — BP 142/70 | HR 65 | Temp 97.2°F | Resp 16 | Ht 69.0 in | Wt 133.0 lb

## 2020-04-01 DIAGNOSIS — G43701 Chronic migraine without aura, not intractable, with status migrainosus: Secondary | ICD-10-CM | POA: Insufficient documentation

## 2020-04-01 DIAGNOSIS — M5481 Occipital neuralgia: Secondary | ICD-10-CM | POA: Insufficient documentation

## 2020-04-01 MED ORDER — DEXAMETHASONE SODIUM PHOSPHATE 10 MG/ML IJ SOLN
10.0000 mg | Freq: Once | INTRAMUSCULAR | Status: AC
  Administered 2020-04-01: 10 mg
  Filled 2020-04-01: qty 1

## 2020-04-01 MED ORDER — LIDOCAINE HCL 2 % IJ SOLN
20.0000 mL | Freq: Once | INTRAMUSCULAR | Status: AC
  Administered 2020-04-01: 200 mg
  Filled 2020-04-01: qty 10

## 2020-04-01 NOTE — Progress Notes (Signed)
Safety precautions to be maintained throughout the outpatient stay will include: orient to surroundings, keep bed in low position, maintain call bell within reach at all times, provide assistance with transfer out of bed and ambulation.  

## 2020-04-01 NOTE — Progress Notes (Signed)
PROVIDER NOTE: Information contained herein reflects review and annotations entered in association with encounter. Interpretation of such information and data should be left to medically-trained personnel. Information provided to patient can be located elsewhere in the medical record under "Patient Instructions". Document created using STT-dictation technology, any transcriptional errors that may result from process are unintentional.    Patient: Grant Young  Service Category: Procedure  Provider: [name], MD  DOB: [birth year]  DOS: 04/01/2020  Location: [facility]  MRN: [id number]  Setting: Ambulatory - outpatient  Referring Provider: [name], NP  Type: Established Patient  Specialty: Interventional Pain Management  PCP: [name], NP   Primary Reason for Visit: Interventional Pain Management Treatment. CC: Headache  Procedure:          Anesthesia, Analgesia, Anxiolysis:  Type: Diagnostic, Greater, Occipital Nerve Block  #1  Region: Posterolateral Cervical Level: Occipital Ridge   Laterality: Bilateral  Type: Local Anesthesia  Local Anesthetic: Lidocaine 1-2%  Position: Prone   Indications: 1. 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 Bilateral occipital neuralgia    Pain Score: Pre-procedure: 8 /10 Post-procedure: 6 /10   Pre-op Assessment:  Grant Young is a 74 y.o. (year old), male patient, seen today for interventional treatment. He  has a past surgical history that includes Kidney stone surgery (09/14/1980); Kidney stone surgery (09/15/1995); Lithotripsy (09/14/2014); Lithotripsy (09/14/1996); Lithotripsy (09/14/1997); Gallbladder surgery (09/15/2015); Sigmoidoscopy (09/14/2017); Colonoscopy (09/15/2015); Cholecystectomy (2017); Shoulder surgery (Right, 1984); Back surgery; Spinal cord stimulator implant; Pain pump implantation; Pain pump removal; and Tonsillectomy. Grant Young has a current medication list which  includes the following prescription(s): amitriptyline, anastrozole, butalbital-aspirin-caffeine, vitamin b-12, digestive aids mixture, escitalopram, ezetimibe, glutathione, hyoscyamine sulfate, loperamide hcl, lorazepam, magnesium bisglycinate, melatonin, misc natural products, multiple vitamins-minerals, ondansetron, OVER THE COUNTER MEDICATION, OVER THE COUNTER MEDICATION, papaya, prochlorperazine, propranolol, rabeprazole, sucralfate, testosterone cypionate, tizanidine, chlorthalidone, aimovig, and nattokinase. His primarily concern today is the Headache  Initial Vital Signs:  Pulse/HCG Rate: 70  Temp: (!) 97.2 F (36.2 C) Resp: 16 BP: 132/73 SpO2: 98 %  BMI: Estimated body mass index is 19.64 kg/m as calculated from the following:   Height as of this encounter: 5\' 9"  (1.753 m).   Weight as of this encounter: 133 lb (60.3 kg).  Risk Assessment: Allergies: Reviewed. He is allergic to ace inhibitors, fluoxetine, metoclopramide, nalbuphine, other, amoxicillin-pot clavulanate, doxazosin, duloxetine, penicillins, tamsulosin, trazodone and nefazodone, amlodipine, cinoxacin, ciprofloxacin, nebivolol, olanzapine, olmesartan, pregabalin, prostaglandins, thyroid hormones, zolpidem, duloxetine hcl, fluoxetine hcl, gabapentin, and phenytoin.  Allergy Precautions: None required Coagulopathies: Reviewed. None identified.  Blood-thinner therapy: None at this time Active Infection(s): Reviewed. None identified. Grant Young is afebrile  Site Confirmation: Grant Young was asked to confirm the procedure and laterality before marking the site Procedure checklist: Completed Consent: Before the procedure and under the influence of no sedative(s), amnesic(s), or anxiolytics, the patient was informed of the treatment options, risks and possible complications. To fulfill our ethical and legal obligations, as recommended by the American Medical Association's Code of Ethics, I have informed the patient of my clinical  impression; the nature and purpose of the treatment or procedure; the risks, benefits, and possible complications of the intervention; the alternatives, including doing nothing; the risk(s) and benefit(s) of the alternative treatment(s) or procedure(s); and the risk(s) and benefit(s) of doing nothing. The patient was provided information about the general risks and possible complications associated with the procedure. These may include, but are not limited to:  failure to achieve desired goals, infection, bleeding, organ or nerve damage, allergic reactions, paralysis, and death. In addition, the patient was informed of those risks and complications associated to the procedure, such as failure to decrease pain; infection; bleeding; organ or nerve damage with subsequent damage to sensory, motor, and/or autonomic systems, resulting in permanent pain, numbness, and/or weakness of one or several areas of the body; allergic reactions; (i.e.: anaphylactic reaction); and/or death. Furthermore, the patient was informed of those risks and complications associated with the medications. These include, but are not limited to: allergic reactions (i.e.: anaphylactic or anaphylactoid reaction(s)); adrenal axis suppression; blood sugar elevation that in diabetics may result in ketoacidosis or comma; water retention that in patients with history of congestive heart failure may result in shortness of breath, pulmonary edema, and decompensation with resultant heart failure; weight gain; swelling or edema; medication-induced neural toxicity; particulate matter embolism and blood vessel occlusion with resultant organ, and/or nervous system infarction; and/or aseptic necrosis of one or more joints. Finally, the patient was informed that Medicine is not an exact science; therefore, there is also the possibility of unforeseen or unpredictable risks and/or possible complications that may result in a catastrophic outcome. The patient  indicated having understood very clearly. We have given the patient no guarantees and we have made no promises. Enough time was given to the patient to ask questions, all of which were answered to the patient's satisfaction. Mr. Magnussen has indicated that he wanted to continue with the procedure. Attestation: I, the ordering provider, attest that I have discussed with the patient the benefits, risks, side-effects, alternatives, likelihood of achieving goals, and potential problems during recovery for the procedure that I have provided informed consent. Date  Time: 04/01/2020 10:39 AM  Pre-Procedure Preparation:  Monitoring: As per clinic protocol. Respiration, ETCO2, SpO2, BP, heart rate and rhythm monitor placed and checked for adequate function Safety Precautions: Patient was assessed for positional comfort and pressure points before starting the procedure. Time-out: I initiated and conducted the "Time-out" before starting the procedure, as per protocol. The patient was asked to participate by confirming the accuracy of the "Time Out" information. Verification of the correct person, site, and procedure were performed and confirmed by me, the nursing staff, and the patient. "Time-out" conducted as per Joint Commission's Universal Protocol (UP.01.01.01). Time: 1135  Description of Procedure:          Target Area: Area medial to the occipital artery at the level of the superior nuchal ridge Approach: Posterior approach Area Prepped: Entire Posterior Occipital Region DuraPrep (Iodine Povacrylex [0.7% available iodine] and Isopropyl Alcohol, 74% w/w) Safety Precautions: Aspiration looking for blood return was conducted prior to all injections. At no point did we inject any substances, as a needle was being advanced. No attempts were made at seeking any paresthesias. Safe injection practices and needle disposal techniques used. Medications properly checked for expiration dates. SDV (single dose vial)  medications used. Description of the Procedure: Protocol guidelines were followed. The target area was identified and the area prepped in the usual manner. Skin & deeper tissues infiltrated with local anesthetic. Appropriate amount of time allowed to pass for local anesthetics to take effect. The procedure needles were then advanced to the target area. Proper needle placement secured. Negative aspiration confirmed. Solution injected in intermittent fashion, asking for systemic symptoms every 0.5cc of injectate. The needles were then removed and the area cleansed, making sure to leave some of the prepping solution back to take advantage of its long  term bactericidal properties.  Vitals:   04/01/20 1055 04/01/20 1130 04/01/20 1140  BP:  132/73 (!) 142/70  Pulse: 70 67 65  Resp: 16 18 16   Temp: (!) 97.2 F (36.2 C)    TempSrc: Temporal    SpO2: 98% 98% 97%  Weight: 133 lb (60.3 kg)    Height: 5\' 9"  (1.753 m)      Start Time: 1135 hrs. End Time: 1140 hrs. Materials:  Needle(s) Type: Regular needle Gauge: 25G Length: 1.5-in Medication(s): Please see orders for medications and dosing details. 8 cc solution made of 7 cc of 0.2% ropivacaine, 1 cc of Decadron 10 mg/cc.  4 cc injected for the left greater occipital nerve, 4 cc injected for the right greater occipital nerve.   Post-operative Assessment:  Post-procedure Vital Signs:  Pulse/HCG Rate: 65  Temp: (!) 97.2 F (36.2 C) Resp: 16 BP: (!) 142/70 SpO2: 97 %  EBL: None  Complications: No immediate post-treatment complications observed by team, or reported by patient.  Note: The patient tolerated the entire procedure well. A repeat set of vitals were taken after the procedure and the patient was kept under observation following institutional policy, for this type of procedure. Post-procedural neurological assessment was performed, showing return to baseline, prior to discharge. The patient was provided with post-procedure discharge  instructions, including a section on how to identify potential problems. Should any problems arise concerning this procedure, the patient was given instructions to immediately contact us, at any time, without hesitation. In any case, we plan to contact the patient by telephone for a follow-up status report regarding this interventional procedure.  Comments:  No additional relevant information.  Plan of Care   Medications ordered for procedure: Meds ordered this encounter  Medications  . lidocaine (XYLOCAINE) 2 % (with pres) injection 400 mg  . dexamethasone (DECADRON) injection 10 mg   Medications administered: We administered lidocaine and dexamethasone.  See the medical record for exact dosing, route, and time of administration.  Follow-up plan:   Return for Keep sch. appt.      Status post Botox No. 1 on 01/01/2020 for migraine, lumbar TPI as well. SPG block 03/13/20. Bilateral GONB 04/01/20      Recent Visits Date Type Provider Dept  03/13/20 Procedure visit [name], MD [facility]  02/28/20 Procedure visit [name], MD [facility]  02/20/20 Office Visit [name], MD [facility]  Showing recent visits within past 90 days and meeting all other requirements Today's Visits Date Type Provider Dept  04/01/20 Procedure visit [name], MD [facility]  Showing today's visits and meeting all other requirements Future Appointments Date Type Provider Dept  04/11/20 Appointment [name], MD [facility]  Showing future appointments within next 90 days and meeting all other requirements  Disposition: Discharge home  Discharge (Date  Time): 04/01/2020; 1148 hrs.   Primary Care Physician: [name], NP Location: [facility] Note by: [name], MD Date: 04/01/2020; Time: 12:10 PM  Disclaimer:  Medicine is not an exact science. The only guarantee in medicine is that nothing is  guaranteed. It is important to note that the decision to proceed with this intervention was based on the information collected from the patient. The Data and conclusions were drawn from the patient's questionnaire, the interview, and the physical examination. Because the information was provided in large part by the patient, it cannot be guaranteed that it has not been purposely or unconsciously manipulated.  Every effort has been made to obtain as much relevant data as possible for this evaluation. It is important to note that the conclusions that lead to this procedure are derived in large part from the available data. Always take into account that the treatment will also be dependent on availability of resources and existing treatment guidelines, considered by other Pain Management Practitioners as being common knowledge and practice, at the time of the intervention. For Medico-Legal purposes, it is also important to point out that variation in procedural techniques and pharmacological choices are the acceptable norm. The indications, contraindications, technique, and results of the above procedure should only be interpreted and judged by a Board-Certified Interventional Pain Specialist with extensive familiarity and expertise in the same exact procedure and technique.

## 2020-04-01 NOTE — Patient Instructions (Signed)

## 2020-04-02 ENCOUNTER — Telehealth: Payer: Self-pay | Admitting: *Deleted

## 2020-04-02 DIAGNOSIS — M62472 Contracture of muscle, left ankle and foot: Secondary | ICD-10-CM | POA: Insufficient documentation

## 2020-04-02 DIAGNOSIS — S93129A Dislocation of metatarsophalangeal joint of unspecified toe(s), initial encounter: Secondary | ICD-10-CM | POA: Insufficient documentation

## 2020-04-02 NOTE — Telephone Encounter (Signed)
Attempted to call for post procedure follow-up. Message left. 

## 2020-04-09 ENCOUNTER — Other Ambulatory Visit: Payer: Self-pay | Admitting: Gastroenterology

## 2020-04-11 ENCOUNTER — Other Ambulatory Visit: Payer: Self-pay

## 2020-04-11 ENCOUNTER — Ambulatory Visit
Payer: Medicare Other | Attending: Student in an Organized Health Care Education/Training Program | Admitting: Student in an Organized Health Care Education/Training Program

## 2020-04-11 ENCOUNTER — Encounter: Payer: Self-pay | Admitting: Student in an Organized Health Care Education/Training Program

## 2020-04-11 VITALS — BP 103/76 | HR 73 | Temp 97.3°F | Resp 16 | Ht 69.0 in | Wt 135.0 lb

## 2020-04-11 DIAGNOSIS — G43701 Chronic migraine without aura, not intractable, with status migrainosus: Secondary | ICD-10-CM

## 2020-04-11 DIAGNOSIS — M5481 Occipital neuralgia: Secondary | ICD-10-CM

## 2020-04-11 DIAGNOSIS — G894 Chronic pain syndrome: Secondary | ICD-10-CM | POA: Diagnosis present

## 2020-04-11 DIAGNOSIS — M47816 Spondylosis without myelopathy or radiculopathy, lumbar region: Secondary | ICD-10-CM | POA: Insufficient documentation

## 2020-04-11 NOTE — Progress Notes (Signed)
PROVIDER NOTE: Information contained herein reflects review and annotations entered in association with encounter. Interpretation of such information and data should be left to medically-trained personnel. Information provided to patient can be located elsewhere in the medical record under "Patient Instructions". Document created using STT-dictation technology, any transcriptional errors that may result from process are unintentional.    Patient: Grant Young  Service Category: E/M  Provider: [name], MD  DOB: 1945-11-30  DOS: 04/11/2020  Specialty: Interventional Pain Management  MRN: [id number]  Setting: Ambulatory outpatient  PCP: [name], NP  Type: Established Patient    Referring Provider: [name], NP  Location: Office  Delivery: Face-to-face     HPI  Reason for encounter: [name], a 74 y.o. year old male, is here today for evaluation and management of his Chronic migraine without aura with status migrainosus, not intractable [G43.701]. Grant Young primary complain today is Headache Last encounter: Practice (04/02/2020). My last encounter with him was on 04/01/2020. Pertinent problems: Grant Young has Spinal cord stimulator status [employer]); Neuropathic pain; History of lumbar fusion; Lumbar spondylosis; Chronic migraine without aura with status migrainosus, not intractable; Chronic pain syndrome; and Muscle pain, myofascial on their pertinent problem list. Pain Assessment: Severity of Chronic pain is reported as a 6 /10. Location: Head Anterior, Posterior/around the sides of the head. Onset: More than a month ago. Quality: Aching, Throbbing. Timing: Constant. Modifying factor(s): hot and cold packs, medication. Vitals:  height is _0  (1.753 m) and weight is 135 lb (61.2 kg). His temporal temperature is 97.3 F (36.3 C) (abnormal). His blood pressure is 103/76 and his pulse is 73. His respiration is 16 and oxygen saturation is 100%.    Post-Procedure  Evaluation  Procedure (04/01/2020): Bilateral occipital nerve block Sedation: Please see nurses note.  Effectiveness during initial hour after procedure(Ultra-Short Term Relief): 100 %  Local anesthetic used: Long-acting (4-6 hours) Effectiveness: Defined as any analgesic benefit obtained secondary to the administration of local anesthetics. This carries significant diagnostic value as to the etiological location, or anatomical origin, of the pain. Duration of benefit is expected to coincide with the duration of the local anesthetic used.  Effectiveness during initial 4-6 hours after procedure(Short-Term Relief): 0 %   Long-term benefit: Defined as any relief past the pharmacologic duration of the local anesthetics.  Effectiveness past the initial 6 hours after procedure(Long-Term Relief): 25 %   Current benefits: Defined as benefit that persist at this time.   Analgesia:  Approximately 25% Function: Somewhat improved ROM: Somewhat improved   ROS  Constitutional: + headaches Gastrointestinal: No reported hemesis, hematochezia, vomiting, or acute GI distress Musculoskeletal: Denies any acute onset joint swelling, redness, loss of ROM, or weakness Neurological: No reported episodes of acute onset apraxia, aphasia, dysarthria, agnosia, amnesia, paralysis, loss of coordination, or loss of consciousness  Medication Review  Digestive Aids Mixture, Erenumab-aooe, Glutathione, Hyoscyamine Sulfate, LORazepam, Loperamide HCl, Magnesium Bisglycinate, Melatonin, Misc Natural Products, Multiple Vitamins-Minerals, Nattokinase, OVER THE COUNTER MEDICATION, Papaya, RABEprazole, Testosterone Cypionate, Vitamin B-12, amitriptyline, anastrozole, butalbital-aspirin-caffeine, chlorthalidone, escitalopram, ezetimibe, ondansetron, prochlorperazine, propranolol, sucralfate, and tizanidine  History Review  Allergy: Grant Young is allergic to ace inhibitors, fluoxetine, metoclopramide, nalbuphine, other,  amoxicillin-pot clavulanate, doxazosin, duloxetine, penicillins, tamsulosin, trazodone and nefazodone, amlodipine, cinoxacin, ciprofloxacin, nebivolol, olanzapine, olmesartan, pregabalin, prostaglandins, thyroid hormones, zolpidem, duloxetine hcl, fluoxetine hcl, gabapentin, and phenytoin. Drug: Grant Young  reports previous drug use. Alcohol:  reports current alcohol use. Tobacco:  reports that he has never smoked. He  has never used smokeless tobacco. Social: Grant Young  reports that he has never smoked. He has never used smokeless tobacco. He reports current alcohol use. He reports previous drug use. Medical:  has a past medical history of Anxiety, Chronic back pain, Chronic heart disease, Depression, Headache, History of CT scan of brain (09/14/2018), History of depression, History of gastritis, History of headache, History of kidney stones, History of neuropathy, Hypertension, Insomnia, Lumbar radicular pain, Lyme disease, Skin cancer, basal cell, Sleep trouble, Squamous cell skin cancer, and Substance abuse ([place]). Surgical: Grant Young  has a past surgical history that includes Kidney stone surgery (09/14/1980); Kidney stone surgery (09/15/1995); Lithotripsy (09/14/2014); Lithotripsy (09/14/1996); Lithotripsy (09/14/1997); Gallbladder surgery (09/15/2015); Sigmoidoscopy (09/14/2017); Colonoscopy (09/15/2015); Cholecystectomy (2017); Shoulder surgery (Right, 1984); Back surgery; Spinal cord stimulator implant; Pain pump implantation; Pain pump removal; and Tonsillectomy. Family: family history includes Anxiety disorder in his daughter; Dementia in his mother; Heart disease in his father; Heart failure in his father; Hypertension in his father; Kidney Stones in his daughter; OCD in his daughter; Stroke in his father and mother.  Laboratory Chemistry Profile   Renal Lab Results  Component Value Date   BUN 14 08/24/2019   CREATININE 0.90 03/06/2020   GFRAA 116 07/12/2019   GFRNONAA 96 07/12/2019      Hepatic Lab Results  Component Value Date   AST 17 08/24/2019   ALT 17 08/24/2019   ALBUMIN 4.2 08/24/2019   ALKPHOS 103 08/24/2019     Electrolytes Lab Results  Component Value Date   NA 144 08/24/2019   K 4.1 08/24/2019   CL 104 08/24/2019   CALCIUM 9.7 08/24/2019     Bone No results found for: VD25OH, VD125OH2TOT, QM0867YP9, JK9326ZT2, 25OHVITD1, 25OHVITD2, 25OHVITD3, TESTOFREE, TESTOSTERONE   Inflammation (CRP: Acute Phase) (ESR: Chronic Phase) No results found for: CRP, ESRSEDRATE, LATICACIDVEN     Note: Above Lab results reviewed.  Recent Imaging Review  CT ANGIO HEAD W OR WO CONTRAST CLINICAL DATA:  Chronic daily headaches with intermittent dizziness. Peripheral neuropathy.  EXAM: CT ANGIOGRAPHY HEAD  TECHNIQUE: Multidetector CT imaging of the head was performed using the standard protocol during bolus administration of intravenous contrast. Multiplanar CT image reconstructions and MIPs were obtained to evaluate the vascular anatomy.  CONTRAST:  79m OMNIPAQUE IOHEXOL 350 MG/ML SOLN  COMPARISON:  None.  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 No evidence of acute infarction, hemorrhage, hydrocephalus, extra-axial collection or mass lesion. Prominence of the cerebral sulci reflecting parenchymal volume loss, more pronounced in the bilateral frontal regions.  Vascular: Calcified plaques in the bilateral carotid siphons and left vertebral artery.  Skull: Normal. Negative for fracture or focal lesion.  Sinuses: Imaged portions are clear.  Orbits: No acute finding.  CTA HEAD  Anterior circulation: No significant stenosis, proximal occlusion, aneurysm, or vascular malformation. Calcified plaques are seen in the bilateral ICAs from the proximal cavernous extending into the paraclinoid segment, without stenosis.  Posterior circulation: No  significant stenosis, proximal occlusion, aneurysm, or vascular malformation. Calcified plaque is seen at the the V4 segment of the left vertebral artery without stenosis.  Venous sinuses: As permitted by contrast timing, patent.  Anatomic variants: None.  IMPRESSION: 1. No acute intracranial abnormality. 2. 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 3. No significant stenosis, proximal occlusion, aneurysm, or vascular malformation in the head.  Electronically Signed  By: Pedro Earls M.D.   On: 03/07/2020 14:40 Note: Reviewed        Physical Exam  General appearance: Well nourished, well developed, and well hydrated. In no apparent acute distress Mental status: Alert, oriented x 3 (person, place, & time)       Respiratory: No evidence of acute respiratory distress Eyes: PERLA Vitals: BP 103/76 (BP Location: Right Arm, Patient Position: Sitting, Cuff Size: Normal)   Pulse 73   Temp (!) 97.3 F (36.3 C) (Temporal)   Resp 16   Ht _0  (1.753 m)   Wt 135 lb (61.2 kg)   SpO2 100%   BMI 19.94 kg/m  BMI: Estimated body mass index is 19.94 kg/m as calculated from the following:   Height as of this encounter: _1  (1.753 m).   Weight as of this encounter: 135 lb (61.2 kg). Ideal: Ideal body weight: 70.7 kg (155 lb 13.8 oz)  Posterior scalp unremarkable, no swelling present from when occipital nerve block was performed.    Lumbar Spine Area Exam  Skin & Axial Inspection: Well healed scar from previous spine surgery detected, IPG and spinal cord stimulator in place Alignment: Symmetrical Functional ROM: Restricted ROM       Stability: No instability detected Muscle Tone/Strength: Functionally intact. No obvious neuro-muscular anomalies detected. Sensory (Neurological): Musculoskeletal pain pattern Palpation: No palpable anomalies       Provocative Tests: Hyperextension/rotation  test: (+) bilaterally for facet joint pain. Lumbar quadrant test (Kemp's test): deferred today       Lateral bending test: deferred today       Patrick's Maneuver: deferred today                   FABER* test: deferred today                   S-I anterior distraction/compression test: deferred today         S-I lateral compression test: deferred today         S-I Thigh-thrust test: deferred today         S-I Gaenslen's test: deferred today         *(Flexion, ABduction and External Rotation)   Assessment   Status Diagnosis  Recurring Persistent Controlled 1. 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 Bilateral occipital neuralgia   3. Chronic pain syndrome   4. Lumbar facet arthropathy   5. Lumbar spondylosis      Updated Problems: Problem  Lumbar Spondylosis   Status post lumbar facet medial branch nerve blocks which were helpful for his low back pain.  Can repeat as needed.   Chronic Migraine Without Aura With Status Migrainosus, Not Intractable   Chronic migraines, has tried and failed conservative medications including Topamax, triptan's, monthly injectables.  Has also tried Botox, sphenopalatine ganglion nerve block, bilateral occipital nerve block.  While his pain relief from these interventions has not been very significant, he states that the bilateral occipital nerve block has provided some pain relief and can consider repeating in future.     Plan of Care  Grant Young has a current medication list which includes the following long-term medication(s): amitriptyline, chlorthalidone, escitalopram, ezetimibe, rabeprazole, sucralfate, and testosterone cypionate.  1. Chronic migraine without aura with status migrainosus, not intractable - GREATER OCCIPITAL NERVE BLOCK; Standing  2. Bilateral occipital neuralgia - GREATER OCCIPITAL NERVE BLOCK; Standing  3. Chronic pain syndrome - GREATER OCCIPITAL NERVE BLOCK;  Standing - LUMBAR  FACET(MEDIAL BRANCH NERVE BLOCK) MBNB; Standing  4. Lumbar facet arthropathy - LUMBAR FACET(MEDIAL BRANCH NERVE BLOCK) MBNB; Standing  5. Lumbar spondylosis -LUMBAR FACET(MEDIAL BRANCH NERVE BLOCK) MBNB; Standing   Orders:  Orders Placed This Encounter  Procedures  . GREATER OCCIPITAL NERVE BLOCK    Standing Status:   Standing    Number of Occurrences:   6    Standing Expiration Date:   04/11/2021    Scheduling Instructions:     Procedure: Occipital nerve block     Laterality: Bilateral     Sedation: Patient's choice.     Timeframe: PRN Procedure. Patient will call.    Order Specific Question:   Where will this procedure be performed?    Answer:   [facility]  . LUMBAR FACET(MEDIAL BRANCH NERVE BLOCK) MBNB    Scheduling timeframe: (PRN procedure) Grant Young will call when needed. Clinical indication: Axial low back pain. Lumbosacral Spondylosis (M47.897).  Sedation: Usually done with sedation. (May be done without sedation if so desired by patient.) Requirements: NPO x 8 hrs.; Driver; Stop blood thinners. Interval: No sooner than two weeks for diagnostic or therapeutic. No sooner than every other month for palliative.    Standing Status:   Standing    Number of Occurrences:   5    Standing Expiration Date:   04/11/2021    Scheduling Instructions:     Procedure: Lumbar facet block (AKA.: Lumbosacral medial branch nerve block)     Level: L4/5 and L5/S1     Laterality: Bilateral    Order Specific Question:   Where will this procedure be performed?    Answer:   [facility]   Follow-up plan:   Return if symptoms worsen or fail to improve.     Status post Botox No. 1 on 01/01/2020 for migraine, lumbar TPI as well. SPG block 03/13/20. Bilateral GONB 04/01/20       Recent Visits Date Type Provider Dept  04/01/20 Procedure visit [name], MD [facility]  03/13/20 Procedure visit [name], MD [facility]  02/28/20 Procedure visit [name], MD [facility]  02/20/20 Office Visit [name], MD [facility]  Showing recent visits within past 90 days and meeting all other requirements Today's Visits Date Type Provider Dept  04/11/20 Office Visit [name], MD [facility]  Showing today's visits and meeting all other requirements Future Appointments No visits were found meeting these conditions. Showing future appointments within next 90 days and meeting all other requirements  I discussed the assessment and treatment plan with the patient. The patient was provided an opportunity to ask questions and all were answered. The patient agreed with the plan and demonstrated an understanding of the instructions.  Patient advised to call back or seek an in-person evaluation if the symptoms or condition worsens.  Duration of encounter: 30 minutes.  Note by: [name], MD Date: 04/11/2020; Time: 9:58 AM

## 2020-04-11 NOTE — Progress Notes (Signed)
Safety precautions to be maintained throughout the outpatient stay will include: orient to surroundings, keep bed in low position, maintain call bell within reach at all times, provide assistance with transfer out of bed and ambulation.  

## 2020-05-06 ENCOUNTER — Encounter: Payer: Self-pay | Admitting: Nurse Practitioner

## 2020-05-20 LAB — COMPREHENSIVE METABOLIC PANEL
Albumin: 4.2 (ref 3.5–5.0)
Calcium: 9.8 (ref 8.7–10.7)
GFR calc Af Amer: 101
GFR calc non Af Amer: 87
Globulin: 2.6

## 2020-05-20 LAB — BASIC METABOLIC PANEL
BUN: 1 — AB (ref 4–21)
CO2: 34 — AB (ref 13–22)
Chloride: 102 (ref 99–108)
Glucose: 94
Potassium: 3.2 — AB (ref 3.4–5.3)
Sodium: 145 (ref 137–147)

## 2020-05-20 LAB — CBC AND DIFFERENTIAL
HCT: 47 (ref 41–53)
Hemoglobin: 15.6 (ref 13.5–17.5)
Neutrophils Absolute: 3261
Platelets: 199 (ref 150–399)
WBC: 6.2

## 2020-05-20 LAB — LIPID PANEL
Cholesterol: 170 (ref 0–200)
HDL: 56 (ref 35–70)
LDL Cholesterol: 100
Triglycerides: 59 (ref 40–160)

## 2020-05-20 LAB — HEPATIC FUNCTION PANEL
ALT: 24 (ref 10–40)
AST: 18 (ref 14–40)
Alkaline Phosphatase: 147 — AB (ref 25–125)
Bilirubin, Total: 0.4

## 2020-05-20 LAB — TESTOSTERONE: Testosterone: 33.2

## 2020-05-20 LAB — CBC: RBC: 5.21 — AB (ref 3.87–5.11)

## 2020-06-04 ENCOUNTER — Encounter: Payer: Self-pay | Admitting: Nurse Practitioner

## 2020-06-04 ENCOUNTER — Other Ambulatory Visit: Payer: Self-pay

## 2020-06-04 ENCOUNTER — Ambulatory Visit: Payer: Medicare Other | Admitting: Nurse Practitioner

## 2020-06-04 VITALS — BP 120/80 | HR 49 | Temp 98.0°F | Ht 69.0 in | Wt 140.0 lb

## 2020-06-04 DIAGNOSIS — F419 Anxiety disorder, unspecified: Secondary | ICD-10-CM

## 2020-06-04 DIAGNOSIS — K219 Gastro-esophageal reflux disease without esophagitis: Secondary | ICD-10-CM

## 2020-06-04 DIAGNOSIS — I2510 Atherosclerotic heart disease of native coronary artery without angina pectoris: Secondary | ICD-10-CM

## 2020-06-04 DIAGNOSIS — R519 Headache, unspecified: Secondary | ICD-10-CM

## 2020-06-04 DIAGNOSIS — G629 Polyneuropathy, unspecified: Secondary | ICD-10-CM

## 2020-06-04 DIAGNOSIS — E782 Mixed hyperlipidemia: Secondary | ICD-10-CM

## 2020-06-04 DIAGNOSIS — G8929 Other chronic pain: Secondary | ICD-10-CM

## 2020-06-04 DIAGNOSIS — I10 Essential (primary) hypertension: Secondary | ICD-10-CM | POA: Diagnosis not present

## 2020-06-04 DIAGNOSIS — R3912 Poor urinary stream: Secondary | ICD-10-CM

## 2020-06-04 DIAGNOSIS — M545 Low back pain, unspecified: Secondary | ICD-10-CM

## 2020-06-04 DIAGNOSIS — N401 Enlarged prostate with lower urinary tract symptoms: Secondary | ICD-10-CM

## 2020-06-04 MED ORDER — PROPRANOLOL HCL 40 MG PO TABS: 40.0000 mg | ORAL_TABLET | Freq: Two times a day (BID) | ORAL | 1 refills | Status: DC

## 2020-06-04 MED ORDER — CHLORTHALIDONE 25 MG PO TABS: 25.0000 mg | ORAL_TABLET | Freq: Every day | ORAL | 1 refills | Status: DC

## 2020-06-04 MED ORDER — TIZANIDINE HCL 2 MG PO CAPS: 2.0000 mg | ORAL_CAPSULE | Freq: Four times a day (QID) | ORAL | 1 refills | Status: DC | PRN

## 2020-06-04 MED ORDER — ESCITALOPRAM OXALATE 20 MG PO TABS: 20.0000 mg | ORAL_TABLET | Freq: Every day | ORAL | 1 refills | Status: DC

## 2020-06-04 MED ORDER — EZETIMIBE 10 MG PO TABS: 10.0000 mg | ORAL_TABLET | Freq: Every day | ORAL | 1 refills | Status: DC

## 2020-06-04 MED ORDER — GABAPENTIN 300 MG PO CAPS: 300.0000 mg | ORAL_CAPSULE | Freq: Three times a day (TID) | ORAL | 1 refills | Status: DC

## 2020-06-04 NOTE — Progress Notes (Signed)
Careteam: Patient Care Team: [name], NP as PCP - General (Geriatric Medicine) Kate Sable, MD as PCP - Cardiology (Cardiology) Mhoon, Larkin Ina, MD (Psychiatry) Dermatology, Kara Mead, MD as Consulting Physician (Gastroenterology)  Advanced Directive information Does Patient Have a Medical Advance Directive?: Yes, Type of Advance Directive: [place], Does patient want to make changes to medical advance directive?: No - Patient declined  Allergies  Allergen Reactions  . Ace Inhibitors     Other reaction(s): Cough  . Fluoxetine Anxiety    "made me fall asleep" per pt "bad headaches and "makes Me Crazy" historical allergy noted in [employer] "made me fall asleep" per pt "bad headaches and "makes Me Crazy" Per New Patient Packet.    . Metoclopramide     Other reaction(s): Other (See Comments), Other (See Comments), Unknown Tardive Dyskinesia  historical allergy noted in [employer] Tardive Dyskinesia  Per New Patient Packet.  . Nalbuphine     Used Post Back surgery- Anesthesiologist Error. Patient had Narcotic Withdraw. Per New Patient Packet.   . Other     Other reaction(s): Other (See Comments) Altered mental status in combo with narcotics at previous hospitalization - Full Withdrawal Symptoms Other reaction(s): Rash  . Amoxicillin-Pot Clavulanate Nausea Only    Per New Patient Packet.  . Doxazosin Rash    Other reaction(s): Other - See Comments, Rash UNKNOWN REACTION UNKNOWN REACTION   . Duloxetine Nausea Only    Per New Patient Packet.   Marland Kitchen Penicillins Nausea Only    Per New Patient Packet.  . Tamsulosin Itching and Anxiety    Restless, Flushing, Heavy Chest, Itching, Hyperactive mood and Anxiety. Unable to handle side effects. Per New Patient Packet.    . Trazodone And Nefazodone Itching, Anxiety and Rash    Headache. "INCREASED MY ANXIETY AND HEARTRATE" Flushing, tachycardia "INCREASED MY ANXIETY AND  HEARTRATE" Per New Patient Packet.   . Amlodipine     Shaking, unsure of reaction. Per New Patient Packet.    . Cinoxacin     GI Intolerance, and Dizziness. Per New Patient Packet.   . Ciprofloxacin     Other reaction(s): Unknown  . Nebivolol     Other reaction(s): Unknown  . Olanzapine     Headache and unable to sleep for 3 nights. Per New Patient Packet.   . Olmesartan     Other reaction(s): Unknown  . Pregabalin     Confusion, Lack of concentration, dizziness, and likely drowsiness. Per New Patient Packet.    . Prostaglandins     Other reaction(s): Other (See Comments) Intolerance  . Thyroid Hormones     Other reaction(s): Other (See Comments) Thyroid (Nature Thyroid) contraindicated with some of your other medications.  . Zolpidem     Nightmares, Ineffective after 2 days. Per New Patient Packet.   . Duloxetine Hcl     Other reaction(s): Rash  . Fluoxetine Hcl     Other reaction(s): Rash  . Gabapentin     Gives patient many side effects. Per New Patient Packet.    Marland Kitchen Phenytoin Anxiety    Hyperactivity, and Ineffective. Per New Patient Packet.     Chief Complaint  Patient presents with  . Medical Management of Chronic Issues    6 month follow up visit. Patient would like to discuss medications.   . Best Practice Recommendations    Hep c screening,COVID-19 vaccine, Tetanus/Tdap, Flu vaccine  . Quality Metric Gaps    Colonoscopy     HPI:  Patient is a 74 y.o. male seen in today at the [facility] for follow up. Had foot surgery when he was originally going to follow up.   Right foot pain/left foot pain- seeing ortho at [facility] and recently had surgery on both feet due to bunions and to straighten toes. Reports pain is slowly getting better. The first week or 2 was bad.  Reports the "block" did not kick in at all. Not currently taking anything for pain. He is 10 days out from his second foot and 8 weeks out from his first procedure.  Continues to have neuropathy-  using 300 mg TID, would like [name] to start prescribing as he will not be following with ortho after released from foot surgery  Anxiety- using lexapro 20 mg by mouth daily with ativan 1 mg twice daily routinely. Reports previously on xanax 1 mg during the day and 2 mg at night.   Chronic migraine- followed by neurology and pain medicine. Overall headaches are doing better. He does feel like the botox when getting through pain management has been getting benefit.  Feels like it is mind over matter on some things and he been trying to think in different terms.  has been beneficial also using fioricet (uses ~twice daily, ~3 days a week)  Chronic back pain- s/p L5-S1 fusion followed by pain medicine. Has spinal cord stimulator had an expert come in to work with him on this. He has had adjustment which has helped with nausea, diarrhea and "hyperactivity"  Had nerve block in June and was successful. He has been pleased with the results. Reports he has been the best that he has seen over the last 20 years.   htn- well controlled at home and in office. Taking propranolol 40 mg twice daily with chlorthalidone 25 mg daily   Constipation- controlled  integrative doctor does blood work- having done in October    Has not gotten Vaccine for a couple years in regards to flu shot Got COVID vaccine  Last colonoscopy in 2017, generally get every 3 years. Needs follow up.   Needs [name] to take over prescriptions for zetia,  Review of Systems:  Review of Systems  Constitutional: Negative for chills, fever and weight loss.  HENT: Negative for tinnitus.   Respiratory: Negative for cough, sputum production and shortness of breath.   Cardiovascular: Negative for chest pain, palpitations and leg swelling.  Gastrointestinal: Negative for abdominal pain, constipation, diarrhea and heartburn.  Genitourinary: Positive for frequency (with weak stream). Negative for dysuria and urgency.  Musculoskeletal: Positive for back  pain and myalgias. Negative for falls and joint pain.  Skin: Negative.   Neurological: Positive for dizziness and headaches.  Psychiatric/Behavioral: Negative for depression and memory loss. The patient does not have insomnia.     Past Medical History:  Diagnosis Date  . Anxiety    Per New Patient Packet  . Chronic back pain    Per New Patient Packet  . Chronic heart disease    Per New Patient Packet  . Depression   . Headache   . History of CT scan of brain 09/14/2018   Per New Patient Packet  . History of depression    Per New Patient Packet  . History of gastritis    Per New Patient Packet  . History of headache    Per New Patient Packet  . History of kidney stones    Per New Patient Packet  . History of neuropathy  Per New Patient Packet  . Hypertension    Per New Patient Packet  . Insomnia   . Lumbar radicular pain    Per New Patient Packet  . Lyme disease    Per New Patient Packet  . Skin cancer, basal cell   . Sleep trouble    Per New Patient Packet  . Squamous cell skin cancer   . Substance abuse [facility])    Past Surgical History:  Procedure Laterality Date  . BACK SURGERY    . CHOLECYSTECTOMY  2017  . COLONOSCOPY  09/15/2015   Per New Patient Packet  . FOOT SURGERY Bilateral 03/18/2020  . FOOT SURGERY  08/032021  . GALLBLADDER SURGERY  09/15/2015   Gallbladder Removal. Procedure done by Dr.Beverly. Per New Patient Packet  . KIDNEY STONE SURGERY  09/14/1980   Too many to count. Per New Patient Packet 09/14/1980-09/15/1995  . KIDNEY STONE SURGERY  09/15/1995   Too many to count. Per New Patient Packet  . LITHOTRIPSY  09/14/2014   Per New Patient Packet  . LITHOTRIPSY  09/14/1996   No Stints Used. Per New Patient Packet  . LITHOTRIPSY  09/14/1997   No Stints used. Per New Patient Packet  . PAIN PUMP IMPLANTATION    . PAIN PUMP REMOVAL    . SHOULDER SURGERY Right 1984  . SIGMOIDOSCOPY  09/14/2017   Per New Patient Packet  . SPINAL CORD STIMULATOR  IMPLANT    . TONSILLECTOMY     Social History:   reports that he has never smoked. He has never used smokeless tobacco. He reports current alcohol use. He reports previous drug use.  Family History  Problem Relation Age of Onset  . Heart disease Father   . Heart failure Father   . Hypertension Father   . Stroke Father   . Stroke Mother   . Dementia Mother   . Kidney Stones Daughter   . Anxiety disorder Daughter   . OCD Daughter     Medications: Patient's Medications  New Prescriptions   No medications on file  Previous Medications   ACETAMINOPHEN (TYLENOL) 500 MG TABLET    Take by mouth. As needed   AMITRIPTYLINE (ELAVIL) 10 MG TABLET    Take 20 mg by mouth at bedtime.    ANASTROZOLE (ARIMIDEX) 1 MG TABLET    Take 0.5 mg by mouth 2 (two) times a week.    BUTALBITAL-ASPIRIN-CAFFEINE (FIORINAL) 50-325-40 MG TABLET    Take 1 tablet by mouth as needed for headache.    CHLORTHALIDONE (HYGROTON) 25 MG TABLET    Take 25 mg by mouth daily.   CHOLECALCIFEROL 125 MCG (5000 UT) TABS    Take by mouth.   CYANOCOBALAMIN (VITAMIN B-12) 5000 MCG LOZG    Take 1 lozenge by mouth daily.    DIGESTIVE AIDS MIXTURE (DIGESTION GB PO)    Take 1 capsule by mouth daily.    ESCITALOPRAM (LEXAPRO) 20 MG TABLET    Take 20 mg by mouth daily.    EZETIMIBE (ZETIA) 10 MG TABLET    Take 10 mg by mouth daily.    GABAPENTIN (NEURONTIN) 300 MG CAPSULE    Take 300 mg by mouth 3 (three) times daily.    GLUTATHIONE PO    Take 1 tablet by mouth daily.    LIDOCAINE (LIDODERM) 5 %    Place onto the skin.   LOPERAMIDE HCL (IMODIUM PO)    Take by mouth as needed.    LORAZEPAM (ATIVAN) 1 MG TABLET  Take 1 tablet (1 mg total) by mouth 2 (two) times daily.   MAGNESIUM BISGLYCINATE PO    Take 400 mg by mouth at bedtime.    MELATONIN 10 MG TABS    Take 10 mg by mouth at bedtime.    NATTOKINASE PO    Take 200 mg by mouth daily.    ONDANSETRON (ZOFRAN) 4 MG TABLET    Take 4 mg by mouth as needed for nausea or vomiting.     PROPRANOLOL (INDERAL) 40 MG TABLET    Take 40 mg by mouth in the morning and at bedtime.    RABEPRAZOLE (ACIPHEX) 20 MG TABLET    TAKE 1 TABLET BY MOUTH TWICE A DAY   SUCRALFATE (CARAFATE) 1 G TABLET    Take 1 g by mouth 4 (four) times daily as needed.    TESTOSTERONE CYPIONATE (DEPO-TESTOSTERONE IM)    Inject 0.4 mLs into the muscle every 14 (fourteen) days.    TIZANIDINE (ZANAFLEX) 2 MG CAPSULE    Take 2 mg by mouth every 6 (six) hours as needed.   Modified Medications   No medications on file  Discontinued Medications   CHLORTHALIDONE (HYGROTON) 25 MG TABLET    Take 1 tablet (25 mg total) by mouth daily.   ERENUMAB-AOOE (AIMOVIG) 140 MG/ML SOAJ    Inject 140 mg into the skin. Every 4 weeks for Headache.   HYOSCYAMINE SULFATE (HYOSCYAMINE PO)    1 tablet as needed. Every 5 minutes as needed   MISC NATURAL PRODUCTS (CORTISOLV PO)    Take by mouth 2 (two) times daily.    MULTIPLE VITAMINS-MINERALS (VITAMIN D3 COMPLETE PO)    Take 2,000 Units by mouth daily.    OVER THE COUNTER MEDICATION    Take 1 tablet by mouth in the morning, at noon, and at bedtime. [employer]    OVER THE COUNTER MEDICATION    Take 2 tablets by mouth daily. Candicidal - for yeast   OXYCODONE (OXY IR/ROXICODONE) 5 MG IMMEDIATE RELEASE TABLET    Take 5 mg by mouth every 6 (six) hours as needed.   PAPAYA ENZYME PO    Take by mouth 2 (two) times daily.    PROCHLORPERAZINE (COMPAZINE) 10 MG TABLET    Take 10 mg by mouth 2 (two) times daily as needed.   PROCHLORPERAZINE (COMPAZINE) 10 MG TABLET    Take by mouth.   PROMETHAZINE (PHENERGAN) 25 MG TABLET    Take 25 mg by mouth every 8 (eight) hours as needed.    Physical Exam:  Vitals:   06/04/20 1448  BP: 120/80  Pulse: (!) 49  Temp: 98 F (36.7 C)  TempSrc: Temporal  SpO2: 100%  Weight: 140 lb (63.5 kg)  Height: 5\' 9"  (1.753 m)   Body mass index is 20.67 kg/m. Wt Readings from Last 3 Encounters:  06/04/20 140 lb (63.5 kg)  04/11/20 135 lb (61.2 kg)    04/01/20 133 lb (60.3 kg)    Physical Exam Constitutional:      General: He is not in acute distress.    Appearance: He is well-developed. He is not diaphoretic.  HENT:     Head: Normocephalic and atraumatic.  Eyes:     Conjunctiva/sclera: Conjunctivae normal.     Pupils: Pupils are equal, round, and reactive to light.  Cardiovascular:     Rate and Rhythm: Normal rate and regular rhythm.     Heart sounds: Normal heart sounds.  Pulmonary:     Effort: Pulmonary effort  is normal.     Breath sounds: Normal breath sounds.  Abdominal:     General: Bowel sounds are normal.     Palpations: Abdomen is soft.  Musculoskeletal:        General: No tenderness.     Cervical back: Normal range of motion and neck supple.  Skin:    General: Skin is warm and dry.  Neurological:     Mental Status: He is alert and oriented to person, place, and time.  Psychiatric:        Mood and Affect: Mood normal.        Behavior: Behavior normal.     Labs reviewed: Basic Metabolic Panel: Recent Labs    07/12/19 0000 08/24/19 0000 10/17/19 0000 03/06/20 1134  NA 144 144  --   --   K 3.8 4.1  --   --   CL 103 104  --   --   CO2 30* 33*  --   --   BUN 12 14  --   --   CREATININE 0.8 0.8  --  0.90  CALCIUM 9.5 9.7  --   --   TSH  --  1.90 2.40  --    Liver Function Tests: Recent Labs    08/24/19 0000  AST 17  ALT 17  ALKPHOS 103  ALBUMIN 4.2   No results for input(s): LIPASE, AMYLASE in the last 8760 hours. No results for input(s): AMMONIA in the last 8760 hours. CBC: Recent Labs    08/24/19 0000  WBC 5.2  HGB 15.5  HCT 48  PLT 202   Lipid Panel: Recent Labs    08/24/19 0000  CHOL 168  HDL 48  LDLCALC 96  TRIG 121   TSH: Recent Labs    08/24/19 0000 10/17/19 0000  TSH 1.90 2.40   A1C: Lab Results  Component Value Date   HGBA1C 5.1 10/17/2019     Assessment/Plan 1. Gastroesophageal reflux disease without esophagitis -controlled on aciphex 20 mg BID.   2.  Chronic intractable headache, unspecified headache type -ongoing but seeing benefit from botox injections and uses fiorinal PRN  3. Essential hypertension Well controlled on propranolol and chlorthalidone. - chlorthalidone (HYGROTON) 25 MG tablet; Take 1 tablet (25 mg total) by mouth daily.  Dispense: 90 tablet; Refill: 1 - propranolol (INDERAL) 40 MG tablet; Take 1 tablet (40 mg total) by mouth in the morning and at bedtime.  Dispense: 180 tablet; Refill: 1  4. Chronic bilateral low back pain without sciatica Continues with pain management  - tizanidine (ZANAFLEX) 2 MG capsule; Take 1 capsule (2 mg total) by mouth every 6 (six) hours as needed.  Dispense: 120 capsule; Refill: 1  5. Anxiety Stable on lexapro with twice daily ativan He is taking ativan 1 mg routinely twice a day Encouraged dose reduction and educated that this may take months to obtain. He is willing to start dose reduction at this time.  - escitalopram (LEXAPRO) 20 MG tablet; Take 1 tablet (20 mg total) by mouth daily.  Dispense: 90 tablet; Refill: 1  6. Mixed hyperlipidemia -labs being done by his integrative medicine doctor and will have them sent to [name]. Continue heart healthy diet.   - ezetimibe (ZETIA) 10 MG tablet; Take 1 tablet (10 mg total) by mouth daily.  Dispense: 90 tablet; Refill: 1  7. Neuropathy Symptoms being managed with gabapentin, previously did not do well with gabapentin but tolerating at current dose.  - gabapentin (NEURONTIN) 300 MG capsule;  Take 1 capsule (300 mg total) by mouth 3 (three) times daily.  Dispense: 180 capsule; Refill: 1  8. Benign prostatic hyperplasia with weak urinary stream -ongoing symptoms of weak stream and urinary frequency. Requesting referral to urology.  - Ambulatory referral to Urology  Next appt: 6 months.  Carlos American. [place], [facility] [phone]

## 2020-06-04 NOTE — Patient Instructions (Addendum)
DUE FOR TDAP (can get at pharmacy), time to get FLU vaccine ( can get at [facility] or local pharmacy)  Recommended to get screening for Hep C via blood test  Please have all blood test sent to [name] after your visit with integrative medicine doctor  To call about setting up follow up for colonoscopy    Try to cut back on ativan- would recommend using half tablet and then if needed take additional half. Would try to cut back so you are using the smallest dose as needed   To use zanaflex as needed as well, see if you can stretch to three times daily

## 2020-06-05 NOTE — Telephone Encounter (Signed)
Routing message to PCP Eubanks, Jessica K, NP . Please Advise. 

## 2020-06-08 ENCOUNTER — Other Ambulatory Visit: Payer: Self-pay | Admitting: Nurse Practitioner

## 2020-06-08 DIAGNOSIS — F419 Anxiety disorder, unspecified: Secondary | ICD-10-CM

## 2020-06-09 ENCOUNTER — Encounter: Payer: Self-pay | Admitting: Student in an Organized Health Care Education/Training Program

## 2020-06-09 ENCOUNTER — Encounter: Payer: Self-pay | Admitting: Nurse Practitioner

## 2020-06-10 NOTE — Telephone Encounter (Signed)
Last labs 10/17/19  LOV 06/04/20  No contract on file in [place]

## 2020-06-19 ENCOUNTER — Other Ambulatory Visit: Payer: Self-pay

## 2020-06-19 ENCOUNTER — Ambulatory Visit
Admission: RE | Admit: 2020-06-19 | Discharge: 2020-06-19 | Disposition: A | Discharge status: Home / Self Care | Payer: Medicare Other | Source: Ambulatory Visit | Attending: Urology | Admitting: Urology

## 2020-06-19 ENCOUNTER — Encounter: Payer: Self-pay | Admitting: Urology

## 2020-06-19 ENCOUNTER — Ambulatory Visit (INDEPENDENT_AMBULATORY_CARE_PROVIDER_SITE_OTHER): Payer: Medicare Other | Admitting: Urology

## 2020-06-19 VITALS — BP 153/87 | HR 69 | Ht 69.0 in | Wt 142.7 lb

## 2020-06-19 DIAGNOSIS — R3912 Poor urinary stream: Secondary | ICD-10-CM

## 2020-06-19 DIAGNOSIS — N401 Enlarged prostate with lower urinary tract symptoms: Secondary | ICD-10-CM

## 2020-06-19 DIAGNOSIS — N529 Male erectile dysfunction, unspecified: Secondary | ICD-10-CM

## 2020-06-19 DIAGNOSIS — N200 Calculus of kidney: Secondary | ICD-10-CM

## 2020-06-19 LAB — MICROSCOPIC EXAMINATION
Bacteria, UA: NONE SEEN
Epithelial Cells (non renal): NONE SEEN /hpf (ref 0–10)

## 2020-06-19 LAB — BLADDER SCAN AMB NON-IMAGING

## 2020-06-19 LAB — URINALYSIS, COMPLETE
Bilirubin, UA: NEGATIVE
Glucose, UA: NEGATIVE
Ketones, UA: NEGATIVE
Leukocytes,UA: NEGATIVE
Nitrite, UA: NEGATIVE
Protein,UA: NEGATIVE
RBC, UA: NEGATIVE
Specific Gravity, UA: 1.02 (ref 1.005–1.030)
Urobilinogen, Ur: 0.2 mg/dL (ref 0.2–1.0)
pH, UA: 6.5 (ref 5.0–7.5)

## 2020-06-19 MED ORDER — NONFORMULARY OR COMPOUNDED ITEM: 11 refills | Status: DC

## 2020-06-19 NOTE — Progress Notes (Signed)
06/19/20 11:42 AM   [name] [birth year] [id number]  CC: BPH, ED, nephrolithiasis  HPI: I saw Mr. Cafaro in urology clinic for the above issues.  He is a 74 year old male who recently moved to the area from [place].  He has a history of a greenlight PVP approximately 15 years ago that significantly improved his urinary symptoms.  He has mild urinary symptoms currently with his primary complaint being weak stream, and IPSS score of 8, with quality of life mostly satisfied.  PVR is normal today at 45 mL.  Urinalysis is benign with 0-5 WBCs, 0-2 RBCs, no bacteria, nitrite negative, no leukocytes.  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  This dose is working for him currently, and it currently is sufficient for an erection that lasts a few hours.  He failed PDE 5 inhibitor secondary to severe headaches, and he reports he has an allergy to alprostadil.  He also has a long history of nephrolithiasis requiring multiple procedures in the past, but denies any recent stone events over the last few years.  He denies any flank pain or gross hematuria.  There is a CT abdomen pelvis from October 2020 in epic that comments on bilateral nonobstructing stones, but I am unable to personally review the images.  Last PSA was normal at 0.45 in December 2020.  Urinalysis is benign today with 0-5 WBCs, 0-2 RBCs, no bacteria, nitrite negative.   PMH: Past Medical History:  Diagnosis Date  . Anxiety    Per New Patient Packet  . Chronic back pain    Per New Patient Packet  . Chronic heart disease    Per New Patient Packet  . Depression   . Headache   . History of CT scan of brain 09/14/2018   Per New Patient Packet  . History of depression    Per New Patient Packet  . History of gastritis    Per New Patient Packet  . History of headache    Per New Patient Packet  . History of kidney stones    Per New Patient Packet  . History of neuropathy    Per New Patient  Packet  . Hypertension    Per New Patient Packet  . Insomnia   . Kidney stone   . Lumbar radicular pain    Per New Patient Packet  . Lyme disease    Per New Patient Packet  . Skin cancer, basal cell   . Sleep trouble    Per New Patient Packet  . Squamous cell skin cancer   . Substance abuse [facility])     Surgical History: Past Surgical History:  Procedure Laterality Date  . BACK SURGERY    . CHOLECYSTECTOMY  2017  . COLONOSCOPY  09/15/2015   Per New Patient Packet  . FOOT SURGERY Bilateral 03/18/2020  . FOOT SURGERY  08/032021  . GALLBLADDER SURGERY  09/15/2015   Gallbladder Removal. Procedure done by Dr.Beverly. Per New Patient Packet  . KIDNEY STONE SURGERY  09/14/1980   Too many to count. Per New Patient Packet 09/14/1980-09/15/1995  . KIDNEY STONE SURGERY  09/15/1995   Too many to count. Per New Patient Packet  . LITHOTRIPSY  09/14/2014   Per New Patient Packet  . LITHOTRIPSY  09/14/1996   No Stints Used. Per New Patient Packet  . LITHOTRIPSY  09/14/1997   No Stints used. Per New Patient Packet  . PAIN PUMP IMPLANTATION    .  PAIN PUMP REMOVAL    . SHOULDER SURGERY Right 1984  . SIGMOIDOSCOPY  09/14/2017   Per New Patient Packet  . SPINAL CORD STIMULATOR IMPLANT    . TONSILLECTOMY      Family History: Family History  Problem Relation Age of Onset  . Heart disease Father   . Heart failure Father   . Hypertension Father   . Stroke Father   . Stroke Mother   . Dementia Mother   . Kidney Stones Daughter   . Anxiety disorder Daughter   . OCD Daughter     Social History:  reports that he has never smoked. He has never used smokeless tobacco. He reports current alcohol use. He reports previous drug use.  Physical Exam: BP (!) 153/87 (BP Location: Left Arm, Patient Position: Sitting, Cuff Size: Normal)   Pulse 69   Ht 5\' 9"  (1.753 m)   Wt 142 lb 11.2 oz (64.7 kg)   BMI 21.07 kg/m    Constitutional:  Alert and oriented, No acute distress.   Frail-appearing Cardiovascular: No clubbing, cyanosis, or edema. Respiratory: Normal respiratory effort, no increased work of breathing. GI: Abdomen is soft, nontender, nondistended, no abdominal masses  Laboratory Data: Reviewed, see HPI  Pertinent Imaging: See HPI  Assessment & Plan:   In summary, he is a 74 year old male with a number of urologic issues including ED on Bi-mix, BPH status post greenlight PVP 15 years ago, and history of recurrent nephrolithiasis.  We discussed general stone prevention strategies including adequate hydration with goal of producing 2.5 L of urine daily, increasing citric acid intake, increasing calcium intake during high oxalate meals, minimizing animal protein, and decreasing salt intake. Information about dietary recommendations given today.   We reviewed the AUA guidelines regarding PSA screening, and his PSA is always been less than 1.  I recommended discontinuing screening for those guidelines, as passage of 70 the risks typically outweigh the benefits.  Using shared decision making, he agrees to discontinue PSA screening.  Continue bi-mix for ED, prescription refilled RTC 6 months for symptom check, IPSS, PVR Consider cystoscopy/TRUS in the future if worsening urinary symptoms  Nickolas Madrid, MD 06/19/2020  [place] [address], [place], [place] [address] [phone]

## 2020-06-19 NOTE — Patient Instructions (Signed)
Dietary Guidelines to Help Prevent Kidney Stones Kidney stones are deposits of minerals and salts that form inside your kidneys. Your risk of developing kidney stones may be greater depending on your diet, your lifestyle, the medicines you take, and whether you have certain medical conditions. Most people can reduce their chances of developing kidney stones by following the instructions below. Depending on your overall health and the type of kidney stones you tend to develop, your dietitian may give you more specific instructions. What are tips for following this plan? Reading food labels  Choose foods with "no salt added" or "low-salt" labels. Limit your sodium intake to less than 1500 mg per day.  Choose foods with calcium for each meal and snack. Try to eat about 300 mg of calcium at each meal. Foods that contain 200-500 mg of calcium per serving include: ? 8 oz (237 ml) of milk, fortified nondairy milk, and fortified fruit juice. ? 8 oz (237 ml) of kefir, yogurt, and soy yogurt. ? 4 oz (118 ml) of tofu. ? 1 oz of cheese. ? 1 cup (300 g) of dried figs. ? 1 cup (91 g) of cooked broccoli. ? 1-3 oz can of sardines or mackerel.  Most people need 1000 to 1500 mg of calcium each day. Talk to your dietitian about how much calcium is recommended for you. Shopping  Buy plenty of fresh fruits and vegetables. Most people do not need to avoid fruits and vegetables, even if they contain nutrients that may contribute to kidney stones.  When shopping for convenience foods, choose: ? Whole pieces of fruit. ? Premade salads with dressing on the side. ? Low-fat fruit and yogurt smoothies.  Avoid buying frozen meals or prepared deli foods.  Look for foods with live cultures, such as yogurt and kefir. Cooking  Do not add salt to food when cooking. Place a salt shaker on the table and allow each person to add his or her own salt to taste.  Use vegetable protein, such as beans, textured vegetable  protein (TVP), or tofu instead of meat in pasta, casseroles, and soups. Meal planning   Eat less salt, if told by your dietitian. To do this: ? Avoid eating processed or premade food. ? Avoid eating fast food.  Eat less animal protein, including cheese, meat, poultry, or fish, if told by your dietitian. To do this: ? Limit the number of times you have meat, poultry, fish, or cheese each week. Eat a diet free of meat at least 2 days a week. ? Eat only one serving each day of meat, poultry, fish, or seafood. ? When you prepare animal protein, cut pieces into small portion sizes. For most meat and fish, one serving is about the size of one deck of cards.  Eat at least 5 servings of fresh fruits and vegetables each day. To do this: ? Keep fruits and vegetables on hand for snacks. ? Eat 1 piece of fruit or a handful of berries with breakfast. ? Have a salad and fruit at lunch. ? Have two kinds of vegetables at dinner.  Limit foods that are high in a substance called oxalate. These include: ? Spinach. ? Rhubarb. ? Beets. ? Potato chips and french fries. ? Nuts.  If you regularly take a diuretic medicine, make sure to eat at least 1-2 fruits or vegetables high in potassium each day. These include: ? Avocado. ? Banana. ? Orange, prune, carrot, or tomato juice. ? Baked potato. ? Cabbage. ? Beans and split   peas. General instructions   Drink enough fluid to keep your urine clear or pale yellow. This is the most important thing you can do.  Talk to your health care provider and dietitian about taking daily supplements. Depending on your health and the cause of your kidney stones, you may be advised: ? Not to take supplements with vitamin C. ? To take a calcium supplement. ? To take a daily probiotic supplement. ? To take other supplements such as magnesium, fish oil, or vitamin B6.  Take all medicines and supplements as told by your health care provider.  Limit alcohol intake to no  more than 1 drink a day for nonpregnant women and 2 drinks a day for men. One drink equals 12 oz of beer, 5 oz of wine, or 1 oz of hard liquor.  Lose weight if told by your health care provider. Work with your dietitian to find strategies and an eating plan that works best for you. What foods are not recommended? Limit your intake of the following foods, or as told by your dietitian. Talk to your dietitian about specific foods you should avoid based on the type of kidney stones and your overall health. Grains Breads. Bagels. Rolls. Baked goods. Salted crackers. Cereal. Pasta. Vegetables Spinach. Rhubarb. Beets. Canned vegetables. Pickles. Olives. Meats and other protein foods Nuts. Nut butters. Large portions of meat, poultry, or fish. Salted or cured meats. Deli meats. Hot dogs. Sausages. Dairy Cheese. Beverages Regular soft drinks. Regular vegetable juice. Seasonings and other foods Seasoning blends with salt. Salad dressings. Canned soups. Soy sauce. Ketchup. Barbecue sauce. Canned pasta sauce. Casseroles. Pizza. Lasagna. Frozen meals. Potato chips. French fries. Summary  You can reduce your risk of kidney stones by making changes to your diet.  The most important thing you can do is drink enough fluid. You should drink enough fluid to keep your urine clear or pale yellow.  Ask your health care provider or dietitian how much protein from animal sources you should eat each day, and also how much salt and calcium you should have each day. This information is not intended to replace advice given to you by your health care provider. Make sure you discuss any questions you have with your health care provider. Document Revised: 12/21/2018 Document Reviewed: 08/11/2016 [employer] Patient Education  2020 [employer]  

## 2020-06-20 ENCOUNTER — Telehealth: Payer: Self-pay

## 2020-06-20 NOTE — Telephone Encounter (Signed)
-----   Message from Billey Co, MD sent at 06/20/2020  8:18 AM EDT ----- Small non-obstructing kidney stone bilaterally, keep follow up as scheduled  Nickolas Madrid, MD 06/20/2020

## 2020-06-20 NOTE — Telephone Encounter (Signed)
Called pt informed him of the information below. Pt gave verbal understanding.  

## 2020-06-21 ENCOUNTER — Encounter: Payer: Self-pay | Admitting: Nurse Practitioner

## 2020-06-21 ENCOUNTER — Telehealth: Payer: Self-pay

## 2020-06-21 DIAGNOSIS — B009 Herpesviral infection, unspecified: Secondary | ICD-10-CM

## 2020-06-21 NOTE — Telephone Encounter (Signed)
Bi-Mix Injection  Papaverine HCI/Phentolamind Mesylate 150mg  /5mg /Vial   Dispense 1 -60mL Vial, 11 refills Inject 0.61mL daily into penis as needed   Psychologist, educational [phone] Phone - [phone]

## 2020-06-24 MED ORDER — VALACYCLOVIR HCL 1 G PO TABS: ORAL_TABLET | ORAL | 1 refills | Status: DC

## 2020-07-08 ENCOUNTER — Telehealth: Payer: Self-pay | Admitting: Student in an Organized Health Care Education/Training Program

## 2020-07-08 ENCOUNTER — Other Ambulatory Visit: Payer: Self-pay | Admitting: Nurse Practitioner

## 2020-07-08 DIAGNOSIS — F419 Anxiety disorder, unspecified: Secondary | ICD-10-CM

## 2020-07-08 NOTE — Telephone Encounter (Signed)
Grant Young, you can put him on for Wednesday, October 27 at 7:45 AM or 10 AM

## 2020-07-08 NOTE — Telephone Encounter (Signed)
Last filled 06/10/2020. No treatment agreement on file. Notation made on pending appointment for patient to sign treatment agreement

## 2020-07-08 NOTE — Telephone Encounter (Signed)
Dr. Holley Raring, Mr. Lamarque is scheduled for a facet block on 07-29-20. He wants to come in sometime this week or next week if possible as he is going out of town the second week of Nov. Your schedule is full at this time but he ask if you would work him in.

## 2020-07-18 ENCOUNTER — Telehealth: Payer: Self-pay | Admitting: Gastroenterology

## 2020-07-18 NOTE — Telephone Encounter (Signed)
If having diarrhea need stool test for C. difficile and GI PCR

## 2020-07-18 NOTE — Telephone Encounter (Signed)
Patients wife Otilio Saber calling stating pt is having same symptoms as before when he has Cdiff. Patient wife wanting advice on what to do. Please advise and let pt and wife know.

## 2020-07-19 NOTE — Telephone Encounter (Signed)
Spoke with pt's wife and informed her of Dr. Georgeann Oppenheim suggestion. She states pt's pcp has ordered the stool test she plans to fax the results to our office for Dr. Vicente Males to review.

## 2020-07-20 ENCOUNTER — Other Ambulatory Visit: Payer: Self-pay | Admitting: Gastroenterology

## 2020-07-29 ENCOUNTER — Other Ambulatory Visit: Payer: Self-pay

## 2020-07-29 ENCOUNTER — Ambulatory Visit
Admission: RE | Admit: 2020-07-29 | Discharge: 2020-07-29 | Disposition: A | Discharge status: Home / Self Care | Payer: Medicare Other | Source: Ambulatory Visit | Attending: Student in an Organized Health Care Education/Training Program | Admitting: Student in an Organized Health Care Education/Training Program

## 2020-07-29 ENCOUNTER — Encounter: Payer: Self-pay | Admitting: Student in an Organized Health Care Education/Training Program

## 2020-07-29 ENCOUNTER — Ambulatory Visit
Payer: Worker's Compensation | Attending: Student in an Organized Health Care Education/Training Program | Admitting: Student in an Organized Health Care Education/Training Program

## 2020-07-29 VITALS — BP 134/74 | HR 63 | Temp 97.2°F | Resp 15 | Ht 69.0 in | Wt 142.0 lb

## 2020-07-29 DIAGNOSIS — G894 Chronic pain syndrome: Secondary | ICD-10-CM | POA: Diagnosis present

## 2020-07-29 DIAGNOSIS — M47816 Spondylosis without myelopathy or radiculopathy, lumbar region: Secondary | ICD-10-CM

## 2020-07-29 MED ORDER — DEXAMETHASONE SODIUM PHOSPHATE 10 MG/ML IJ SOLN
10.0000 mg | Freq: Once | INTRAMUSCULAR | Status: AC
  Administered 2020-07-29: 10 mg

## 2020-07-29 MED ORDER — LIDOCAINE HCL 2 % IJ SOLN
INTRAMUSCULAR | Status: AC
  Filled 2020-07-29: qty 20

## 2020-07-29 MED ORDER — DEXAMETHASONE SODIUM PHOSPHATE 10 MG/ML IJ SOLN
INTRAMUSCULAR | Status: AC
  Filled 2020-07-29: qty 1

## 2020-07-29 MED ORDER — ROPIVACAINE HCL 2 MG/ML IJ SOLN
9.0000 mL | Freq: Once | INTRAMUSCULAR | Status: AC
  Administered 2020-07-29: 9 mL via PERINEURAL

## 2020-07-29 MED ORDER — ROPIVACAINE HCL 2 MG/ML IJ SOLN
INTRAMUSCULAR | Status: AC
  Filled 2020-07-29: qty 10

## 2020-07-29 MED ORDER — LIDOCAINE HCL 2 % IJ SOLN
20.0000 mL | Freq: Once | INTRAMUSCULAR | Status: AC
  Administered 2020-07-29: 400 mg

## 2020-07-29 NOTE — Progress Notes (Signed)
PROVIDER NOTE: Information contained herein reflects review and annotations entered in association with encounter. Interpretation of such information and data should be left to medically-trained personnel. Information provided to patient can be located elsewhere in the medical record under "Patient Instructions". Document created using STT-dictation technology, any transcriptional errors that may result from process are unintentional.    Patient: Grant Young  Service Category: Procedure  Provider: [name], MD  DOB: 11-13-1945  DOS: 07/29/2020  Location: [facility]  MRN: [id number]  Setting: Ambulatory - outpatient  Referring Provider: [name], NP  Type: Established Patient  Specialty: Interventional Pain Management  PCP: [name], NP   Primary Reason for Visit: Interventional Pain Management Treatment. CC: Back Pain (lumbar bilateral )  Procedure:          Anesthesia, Analgesia, Anxiolysis:  Type: Lumbar Facet, Medial Branch Block(s) #2  Primary Purpose: Therapeutic Region: Posterolateral Lumbosacral Spine Level: L3, L4, L5, Medial Branch Level(s). Injecting these levels blocks the L3-4, L4-5,  lumbar facet joints. Laterality: Bilateral  Type: Local Anesthesia  Local Anesthetic: Lidocaine 1-2%  Position: Prone   Indications: 1. Lumbar facet arthropathy   2. Lumbar spondylosis   3. Chronic pain syndrome    Pain Score: Pre-procedure: 6 /10 Post-procedure: 6 /10   Pre-op Assessment:  Grant Young is a 74 y.o. (year old), male patient, seen today for interventional treatment. He  has a past surgical history that includes Kidney stone surgery (09/14/1980); Kidney stone surgery (09/15/1995); Lithotripsy (09/14/2014); Lithotripsy (09/14/1996); Lithotripsy (09/14/1997); Gallbladder surgery (09/15/2015); Sigmoidoscopy (09/14/2017); Colonoscopy (09/15/2015); Cholecystectomy (2017); Shoulder surgery (Right, 1984); Back surgery; Spinal cord stimulator  implant; Pain pump implantation; Pain pump removal; Tonsillectomy; Foot surgery (Bilateral, 03/18/2020); and Foot surgery (08/032021). Grant Young has a current medication list which includes the following prescription(s): acetaminophen, amitriptyline, anastrozole, butalbital-aspirin-caffeine, chlorthalidone, cholecalciferol, vitamin b-12, dicyclomine, digestive aids mixture, escitalopram, ezetimibe, loperamide hcl, lorazepam, magnesium bisglycinate, melatonin, nattokinase, NONFORMULARY OR COMPOUNDED ITEM, ondansetron, potassium chloride, prochlorperazine, propranolol, rabeprazole, sucralfate, testosterone cypionate, tizanidine, valacyclovir, gabapentin, glutathione, and lidocaine. His primarily concern today is the Back Pain (lumbar bilateral )  Initial Vital Signs:  Pulse/HCG Rate: 63ECG Heart Rate: 60 Temp: (!) 97.2 F (36.2 C) Resp: 16 BP: (!) 146/84 SpO2: 100 %  BMI: Estimated body mass index is 20.97 kg/m as calculated from the following:   Height as of this encounter: 5\' 9"  (1.753 m).   Weight as of this encounter: 142 lb (64.4 kg).  Risk Assessment: Allergies: Reviewed. He is allergic to ace inhibitors, fluoxetine, metoclopramide, nalbuphine, other, amoxicillin-pot clavulanate, doxazosin, duloxetine, penicillins, tamsulosin, trazodone and nefazodone, amlodipine, cinoxacin, ciprofloxacin, nebivolol, olanzapine, olmesartan, pregabalin, prostaglandins, thyroid hormones, zolpidem, duloxetine hcl, fluoxetine hcl, and phenytoin.  Allergy Precautions: None required Coagulopathies: Reviewed. None identified.  Blood-thinner therapy: None at this time Active Infection(s): Reviewed. None identified. Grant Young is afebrile  Site Confirmation: Grant Young was asked to confirm the procedure and laterality before marking the site Procedure checklist: Completed Consent: Before the procedure and under the influence of no sedative(s), amnesic(s), or anxiolytics, the patient was informed of the treatment  options, risks and possible complications. To fulfill our ethical and legal obligations, as recommended by the American Medical Association's Code of Ethics, I have informed the patient of my clinical impression; the nature and purpose of the treatment or procedure; the risks, benefits, and possible complications of the intervention; the alternatives, including doing nothing; the risk(s) and benefit(s) of the alternative treatment(s) or procedure(s); and the risk(s) and benefit(s)  of doing nothing. The patient was provided information about the general risks and possible complications associated with the procedure. These may include, but are not limited to: failure to achieve desired goals, infection, bleeding, organ or nerve damage, allergic reactions, paralysis, and death. In addition, the patient was informed of those risks and complications associated to Spine-related procedures, such as failure to decrease pain; infection (i.e.: Meningitis, epidural or intraspinal abscess); bleeding (i.e.: epidural hematoma, subarachnoid hemorrhage, or any other type of intraspinal or peri-dural bleeding); organ or nerve damage (i.e.: Any type of peripheral nerve, nerve root, or spinal cord injury) with subsequent damage to sensory, motor, and/or autonomic systems, resulting in permanent pain, numbness, and/or weakness of one or several areas of the body; allergic reactions; (i.e.: anaphylactic reaction); and/or death. Furthermore, the patient was informed of those risks and complications associated with the medications. These include, but are not limited to: allergic reactions (i.e.: anaphylactic or anaphylactoid reaction(s)); adrenal axis suppression; blood sugar elevation that in diabetics may result in ketoacidosis or comma; water retention that in patients with history of congestive heart failure may result in shortness of breath, pulmonary edema, and decompensation with resultant heart failure; weight gain; swelling or  edema; medication-induced neural toxicity; particulate matter embolism and blood vessel occlusion with resultant organ, and/or nervous system infarction; and/or aseptic necrosis of one or more joints. Finally, the patient was informed that Medicine is not an exact science; therefore, there is also the possibility of unforeseen or unpredictable risks and/or possible complications that may result in a catastrophic outcome. The patient indicated having understood very clearly. We have given the patient no guarantees and we have made no promises. Enough time was given to the patient to ask questions, all of which were answered to the patient's satisfaction. Mr. Hoben has indicated that he wanted to continue with the procedure. Attestation: I, the ordering provider, attest that I have discussed with the patient the benefits, risks, side-effects, alternatives, likelihood of achieving goals, and potential problems during recovery for the procedure that I have provided informed consent. Date  Time: 07/29/2020 10:50 AM  Pre-Procedure Preparation:  Monitoring: As per clinic protocol. Respiration, ETCO2, SpO2, BP, heart rate and rhythm monitor placed and checked for adequate function Safety Precautions: Patient was assessed for positional comfort and pressure points before starting the procedure. Time-out: I initiated and conducted the "Time-out" before starting the procedure, as per protocol. The patient was asked to participate by confirming the accuracy of the "Time Out" information. Verification of the correct person, site, and procedure were performed and confirmed by me, the nursing staff, and the patient. "Time-out" conducted as per Joint Commission's Universal Protocol (UP.01.01.01). Time: 1135  Description of Procedure:          Laterality: Bilateral. The procedure was performed in identical fashion on both sides. Levels:  L3, L4, L5,  Medial Branch Level(s) Area Prepped: Posterior Lumbosacral  Region DuraPrep (Iodine Povacrylex [0.7% available iodine] and Isopropyl Alcohol, 74% w/w) Safety Precautions: Aspiration looking for blood return was conducted prior to all injections. At no point did we inject any substances, as a needle was being advanced. Before injecting, the patient was told to immediately notify me if he was experiencing any new onset of "ringing in the ears, or metallic taste in the mouth". No attempts were made at seeking any paresthesias. Safe injection practices and needle disposal techniques used. Medications properly checked for expiration dates. SDV (single dose vial) medications used. After the completion of the procedure, all disposable equipment  used was discarded in the proper designated medical waste containers. Local Anesthesia: Protocol guidelines were followed. The patient was positioned over the fluoroscopy table. The area was prepped in the usual manner. The time-out was completed. The target area was identified using fluoroscopy. A 12-in long, straight, sterile hemostat was used with fluoroscopic guidance to locate the targets for each level blocked. Once located, the skin was marked with an approved surgical skin marker. Once all sites were marked, the skin (epidermis, dermis, and hypodermis), as well as deeper tissues (fat, connective tissue and muscle) were infiltrated with a small amount of a short-acting local anesthetic, loaded on a 10cc syringe with a 25G, 1.5-in  Needle. An appropriate amount of time was allowed for local anesthetics to take effect before proceeding to the next step. Local Anesthetic: Lidocaine 2.0% The unused portion of the local anesthetic was discarded in the proper designated containers. Technical explanation of process:   L3 Medial Branch Nerve Block (MBB): The target area for the L3 medial branch is at the junction of the postero-lateral aspect of the superior articular process and the superior, posterior, and medial edge of the  transverse process of L4. Under fluoroscopic guidance, a Quincke needle was inserted until contact was made with os over the superior postero-lateral aspect of the pedicular shadow (target area). After negative aspiration for blood, 1.5 mL of the nerve block solution was injected without difficulty or complication. The needle was removed intact. L4 Medial Branch Nerve Block (MBB): The target area for the L4 medial branch is at the junction of the postero-lateral aspect of the superior articular process and the superior, posterior, and medial edge of the transverse process of L5. Under fluoroscopic guidance, a Quincke needle was inserted until contact was made with os over the superior postero-lateral aspect of the pedicular shadow (target area). After negative aspiration for blood, 1.5 mL of the nerve block solution was injected without difficulty or complication. The needle was removed intact. L5 Medial Branch Nerve Block (MBB): The target area for the L5 medial branch is at the junction of the postero-lateral aspect of the superior articular process and the superior, posterior, and medial edge of the sacral ala. Under fluoroscopic guidance, a Quincke needle was inserted until contact was made with os over the superior postero-lateral aspect of the pedicular shadow (target area). After negative aspiration for blood, 1.28mL of the nerve block solution was injected without difficulty or complication. The needle was removed intact. Nerve block solution: 10 cc solution made of 8 cc of 0.2% ropivacaine, 2 cc of Decadron 10 mg/cc.  1.5 cc injected at each level above bilaterally.  The unused portion of the solution was discarded in the proper designated containers. Procedural Needles: 22-gauge, 3.5-inch, Quincke needles used for all levels.  Once the entire procedure was completed, the treated area was cleaned, making sure to leave some of the prepping solution back to take advantage of its long term bactericidal  properties.   Illustration of the posterior view of the lumbar spine and the posterior neural structures. Laminae of L2 through S1 are labeled. DPRL5, dorsal primary ramus of L5; DPRS1, dorsal primary ramus of S1; DPR3, dorsal primary ramus of L3; FJ, facet (zygapophyseal) joint L3-L4; I, inferior articular process of L4; LB1, lateral branch of dorsal primary ramus of L1; IAB, inferior articular branches from L3 medial branch (supplies L4-L5 facet joint); IBP, intermediate branch plexus; MB3, medial branch of dorsal primary ramus of L3; NR3, third lumbar nerve root; S, superior articular  process of L5; SAB, superior articular branches from L4 (supplies L4-5 facet joint also); TP3, transverse process of L3.  Vitals:   07/29/20 1134 07/29/20 1139 07/29/20 1145 07/29/20 1147  BP: 133/85 137/75 134/74 134/74  Pulse:      Resp: 16 15 15 15   Temp:      TempSrc:      SpO2: 99% 99% 100% 100%  Weight:      Height:         Start Time: 1135 hrs. End Time: 1146 hrs.  Imaging Guidance (Spinal):          Type of Imaging Technique: Fluoroscopy Guidance (Spinal) Indication(s): Assistance in needle guidance and placement for procedures requiring needle placement in or near specific anatomical locations not easily accessible without such assistance. Exposure Time: Please see nurses notes. Contrast: None used. Fluoroscopic Guidance: I was personally present during the use of fluoroscopy. "Tunnel Vision Technique" used to obtain the best possible view of the target area. Parallax error corrected before commencing the procedure. "Direction-depth-direction" technique used to introduce the needle under continuous pulsed fluoroscopy. Once target was reached, antero-posterior, oblique, and lateral fluoroscopic projection used confirm needle placement in all planes. Images permanently stored in EMR. Interpretation: No contrast injected. I personally interpreted the imaging intraoperatively. Adequate needle placement  confirmed in multiple planes. Permanent images saved into the patient's record.   Post-operative Assessment:  Post-procedure Vital Signs:  Pulse/HCG Rate: 6361 Temp: (!) 97.2 F (36.2 C) Resp: 15 BP: 134/74 SpO2: 100 %  EBL: None  Complications: No immediate post-treatment complications observed by team, or reported by patient.  Note: The patient tolerated the entire procedure well. A repeat set of vitals were taken after the procedure and the patient was kept under observation following institutional policy, for this type of procedure. Post-procedural neurological assessment was performed, showing return to baseline, prior to discharge. The patient was provided with post-procedure discharge instructions, including a section on how to identify potential problems. Should any problems arise concerning this procedure, the patient was given instructions to immediately contact us, at any time, without hesitation. In any case, we plan to contact the patient by telephone for a follow-up status report regarding this interventional procedure.  Comments:  No additional relevant information.  Plan of Care  Orders:  Orders Placed This Encounter  Procedures  . [facility] C-ARM 1-60 MIN NO REPORT    Intraoperative interpretation by procedural physician at [place].    Standing Status:   Standing    Number of Occurrences:   1    Order Specific Question:   Reason for exam:    Answer:   Assistance in needle guidance and placement for procedures requiring needle placement in or near specific anatomical locations not easily accessible without such assistance.    Medications ordered for procedure: Meds ordered this encounter  Medications  . lidocaine (XYLOCAINE) 2 % (with pres) injection 400 mg  . dexamethasone (DECADRON) injection 10 mg  . dexamethasone (DECADRON) injection 10 mg  . ropivacaine (PF) 2 mg/mL (0.2%) (NAROPIN) injection 9 mL  . ropivacaine (PF) 2 mg/mL (0.2%)  (NAROPIN) injection 9 mL   Medications administered: We administered lidocaine, dexamethasone, dexamethasone, ropivacaine (PF) 2 mg/mL (0.2%), and ropivacaine (PF) 2 mg/mL (0.2%).  See the medical record for exact dosing, route, and time of administration.  Follow-up plan:   Return in about 5 weeks (around 09/02/2020) for Post Procedure Evaluation, virtual.    Recent Visits No visits were found meeting these  conditions. Showing recent visits within past 90 days and meeting all other requirements Today's Visits Date Type Provider Dept  07/29/20 Procedure visit [name], MD [facility]  Showing today's visits and meeting all other requirements Future Appointments Date Type Provider Dept  09/17/20 Appointment [name], MD [facility]  Showing future appointments within next 90 days and meeting all other requirements  Disposition: Discharge home  Discharge (Date  Time): 07/29/2020; 1200 hrs.   Primary Care Physician: [name], NP Location: [facility] Note by: [name], MD Date: 07/29/2020; Time: 11:58 AM  Disclaimer:  Medicine is not an exact science. The only guarantee in medicine is that nothing is guaranteed. It is important to note that the decision to proceed with this intervention was based on the information collected from the patient. The Data and conclusions were drawn from the patient's questionnaire, the interview, and the physical examination. Because the information was provided in large part by the patient, it cannot be guaranteed that it has not been purposely or unconsciously manipulated. Every effort has been made to obtain as much relevant data as possible for this evaluation. It is important to note that the conclusions that lead to this procedure are derived in large part from the available data. Always take into account that the treatment will also be dependent on availability of resources and  existing treatment guidelines, considered by other Pain Management Practitioners as being common knowledge and practice, at the time of the intervention. For Medico-Legal purposes, it is also important to point out that variation in procedural techniques and pharmacological choices are the acceptable norm. The indications, contraindications, technique, and results of the above procedure should only be interpreted and judged by a Board-Certified Interventional Pain Specialist with extensive familiarity and expertise in the same exact procedure and technique.

## 2020-07-29 NOTE — Patient Instructions (Signed)
GENERAL RISKS AND COMPLICATIONS  What are the risk, side effects and possible complications? Generally speaking, most procedures are safe.  However, with any procedure there are risks, side effects, and the possibility of complications.  The risks and complications are dependent upon the sites that are lesioned, or the type of nerve block to be performed.  The closer the procedure is to the spine, the more serious the risks are.  Great care is taken when placing the radio frequency needles, block needles or lesioning probes, but sometimes complications can occur. 1. Infection: Any time there is an injection through the skin, there is a risk of infection.  This is why sterile conditions are used for these blocks.  There are four possible types of infection. 1. Localized skin infection. 2. Central Nervous System Infection-This can be in the form of Meningitis, which can be deadly. 3. Epidural Infections-This can be in the form of an epidural abscess, which can cause pressure inside of the spine, causing compression of the spinal cord with subsequent paralysis. This would require an emergency surgery to decompress, and there are no guarantees that the patient would recover from the paralysis. 4. Discitis-This is an infection of the intervertebral discs.  It occurs in about 1% of discography procedures.  It is difficult to treat and it may lead to surgery.        2. Pain: the needles have to go through skin and soft tissues, will cause soreness.       3. Damage to internal structures:  The nerves to be lesioned may be near blood vessels or    other nerves which can be potentially damaged.       4. Bleeding: Bleeding is more common if the patient is taking blood thinners such as  aspirin, Coumadin, Ticiid, Plavix, etc., or if he/she have some genetic predisposition  such as hemophilia. Bleeding into the spinal canal can cause compression of the spinal  cord with subsequent paralysis.  This would require an  emergency surgery to  decompress and there are no guarantees that the patient would recover from the  paralysis.       5. Pneumothorax:  Puncturing of a lung is a possibility, every time a needle is introduced in  the area of the chest or upper back.  Pneumothorax refers to free air around the  collapsed lung(s), inside of the thoracic cavity (chest cavity).  Another two possible  complications related to a similar event would include: Hemothorax and Chylothorax.   These are variations of the Pneumothorax, where instead of air around the collapsed  lung(s), you may have blood or chyle, respectively.       6. Spinal headaches: They may occur with any procedures in the area of the spine.       7. Persistent CSF (Cerebro-Spinal Fluid) leakage: This is a rare problem, but may occur  with prolonged intrathecal or epidural catheters either due to the formation of a fistulous  track or a dural tear.       8. Nerve damage: By working so close to the spinal cord, there is always a possibility of  nerve damage, which could be as serious as a permanent spinal cord injury with  paralysis.       9. Death:  Although rare, severe deadly allergic reactions known as "Anaphylactic  reaction" can occur to any of the medications used.      10. Worsening of the symptoms:  We can always make thing worse.    What are the chances of something like this happening? Chances of any of this occuring are extremely low.  By statistics, you have more of a chance of getting killed in a motor vehicle accident: while driving to the hospital than any of the above occurring .  Nevertheless, you should be aware that they are possibilities.  In general, it is similar to taking a shower.  Everybody knows that you can slip, hit your head and get killed.  Does that mean that you should not shower again?  Nevertheless always keep in mind that statistics do not mean anything if you happen to be on the wrong side of them.  Even if a procedure has a 1  (one) in a 1,000,000 (million) chance of going wrong, it you happen to be that one..Also, keep in mind that by statistics, you have more of a chance of having something go wrong when taking medications.  Who should not have this procedure? If you are on a blood thinning medication (e.g. Coumadin, Plavix, see list of "Blood Thinners"), or if you have an active infection going on, you should not have the procedure.  If you are taking any blood thinners, please inform your physician.  How should I prepare for this procedure?  Do not eat or drink anything at least six hours prior to the procedure.  Bring a driver with you .  It cannot be a taxi.  Come accompanied by an adult that can drive you back, and that is strong enough to help you if your legs get weak or numb from the local anesthetic.  Take all of your medicines the morning of the procedure with just enough water to swallow them.  If you have diabetes, make sure that you are scheduled to have your procedure done first thing in the morning, whenever possible.  If you have diabetes, take only half of your insulin dose and notify our nurse that you have done so as soon as you arrive at the clinic.  If you are diabetic, but only take blood sugar pills (oral hypoglycemic), then do not take them on the morning of your procedure.  You may take them after you have had the procedure.  Do not take aspirin or any aspirin-containing medications, at least eleven (11) days prior to the procedure.  They may prolong bleeding.  Wear loose fitting clothing that may be easy to take off and that you would not mind if it got stained with Betadine or blood.  Do not wear any jewelry or perfume  Remove any nail coloring.  It will interfere with some of our monitoring equipment.  NOTE: Remember that this is not meant to be interpreted as a complete list of all possible complications.  Unforeseen problems may occur.  BLOOD THINNERS The following drugs  contain aspirin or other products, which can cause increased bleeding during surgery and should not be taken for 2 weeks prior to and 1 week after surgery.  If you should need take something for relief of minor pain, you may take acetaminophen which is found in Tylenol,m Datril, Anacin-3 and Panadol. It is not blood thinner. The products listed below are.  Do not take any of the products listed below in addition to any listed on your instruction sheet.  A.P.C or A.P.C with Codeine Codeine Phosphate Capsules #3 Ibuprofen Ridaura  ABC compound Congesprin Imuran rimadil  Advil Cope Indocin Robaxisal  Alka-Seltzer Effervescent Pain Reliever and Antacid Coricidin or Coricidin-D  Indomethacin Rufen    Alka-Seltzer plus Cold Medicine Cosprin Ketoprofen S-A-C Tablets  Anacin Analgesic Tablets or Capsules Coumadin Korlgesic Salflex  Anacin Extra Strength Analgesic tablets or capsules CP-2 Tablets Lanoril Salicylate  Anaprox Cuprimine Capsules Levenox Salocol  Anexsia-D Dalteparin Magan Salsalate  Anodynos Darvon compound Magnesium Salicylate Sine-off  Ansaid Dasin Capsules Magsal Sodium Salicylate  Anturane Depen Capsules Marnal Soma  APF Arthritis pain formula Dewitt's Pills Measurin Stanback  Argesic Dia-Gesic Meclofenamic Sulfinpyrazone  Arthritis Bayer Timed Release Aspirin Diclofenac Meclomen Sulindac  Arthritis pain formula Anacin Dicumarol Medipren Supac  Analgesic (Safety coated) Arthralgen Diffunasal Mefanamic Suprofen  Arthritis Strength Bufferin Dihydrocodeine Mepro Compound Suprol  Arthropan liquid Dopirydamole Methcarbomol with Aspirin Synalgos  ASA tablets/Enseals Disalcid Micrainin Tagament  Ascriptin Doan's Midol Talwin  Ascriptin A/D Dolene Mobidin Tanderil  Ascriptin Extra Strength Dolobid Moblgesic Ticlid  Ascriptin with Codeine Doloprin or Doloprin with Codeine Momentum Tolectin  Asperbuf Duoprin Mono-gesic Trendar  Aspergum Duradyne Motrin or Motrin IB Triminicin  Aspirin  plain, buffered or enteric coated Durasal Myochrisine Trigesic  Aspirin Suppositories Easprin Nalfon Trillsate  Aspirin with Codeine Ecotrin Regular or Extra Strength Naprosyn Uracel  Atromid-S Efficin Naproxen Ursinus  Auranofin Capsules Elmiron Neocylate Vanquish  Axotal Emagrin Norgesic Verin  Azathioprine Empirin or Empirin with Codeine Normiflo Vitamin E  Azolid Emprazil Nuprin Voltaren  Bayer Aspirin plain, buffered or children's or timed BC Tablets or powders Encaprin Orgaran Warfarin Sodium  Buff-a-Comp Enoxaparin Orudis Zorpin  Buff-a-Comp with Codeine Equegesic Os-Cal-Gesic   Buffaprin Excedrin plain, buffered or Extra Strength Oxalid   Bufferin Arthritis Strength Feldene Oxphenbutazone   Bufferin plain or Extra Strength Feldene Capsules Oxycodone with Aspirin   Bufferin with Codeine Fenoprofen Fenoprofen Pabalate or Pabalate-SF   Buffets II Flogesic Panagesic   Buffinol plain or Extra Strength Florinal or Florinal with Codeine Panwarfarin   Buf-Tabs Flurbiprofen Penicillamine   Butalbital Compound Four-way cold tablets Penicillin   Butazolidin Fragmin Pepto-Bismol   Carbenicillin Geminisyn Percodan   Carna Arthritis Reliever Geopen Persantine   Carprofen Gold's salt Persistin   Chloramphenicol Goody's Phenylbutazone   Chloromycetin Haltrain Piroxlcam   Clmetidine heparin Plaquenil   Cllnoril Hyco-pap Ponstel   Clofibrate Hydroxy chloroquine Propoxyphen         Before stopping any of these medications, be sure to consult the physician who ordered them.  Some, such as Coumadin (Warfarin) are ordered to prevent or treat serious conditions such as "deep thrombosis", "pumonary embolisms", and other heart problems.  The amount of time that you may need off of the medication may also vary with the medication and the reason for which you were taking it.  If you are taking any of these medications, please make sure you notify your pain physician before you undergo any  procedures.         Pain Management Discharge Instructions  General Discharge Instructions :  If you need to reach your doctor call: Monday-Friday 8:00 am - 4:00 pm at [phone] or toll free [phone].  After clinic hours [phone] to have operator reach doctor.  Bring all of your medication bottles to all your appointments in the pain clinic.  To cancel or reschedule your appointment with Pain Management please remember to call 24 hours in advance to avoid a fee.  Refer to the educational materials which you have been given on: General Risks, I had my Procedure. Discharge Instructions, Post Sedation.  Post Procedure Instructions:  The drugs you were given will stay in your system until tomorrow, so for the next 24 hours you should   not drive, make any legal decisions or drink any alcoholic beverages.  You may eat anything you prefer, but it is better to start with liquids then soups and crackers, and gradually work up to solid foods.  Please notify your doctor immediately if you have any unusual bleeding, trouble breathing or pain that is not related to your normal pain.  Depending on the type of procedure that was done, some parts of your body may feel week and/or numb.  This usually clears up by tonight or the next day.  Walk with the use of an assistive device or accompanied by an adult for the 24 hours.  You may use ice on the affected area for the first 24 hours.  Put ice in a Ziploc bag and cover with a towel and place against area 15 minutes on 15 minutes off.  You may switch to heat after 24 hours.Facet Joint Block The facet joints connect the bones of the spine (vertebrae). They make it possible for you to bend, twist, and make other movements with your spine. They also keep you from bending too far, twisting too far, and making other extreme movements. A facet joint block is a procedure in which a numbing medicine (anesthetic) is injected into a facet joint. In  many cases, an anti-inflammatory medicine (steroid) is also injected. A facet joint block may be done:  To diagnose neck or back pain. If the pain gets better after a facet joint block, it means the pain is probably coming from the facet joint. If the pain does not get better, it means the pain is probably not coming from the facet joint.  To relieve neck or back pain that is caused by an inflamed facet joint. A facet joint block is only done to relieve pain if the pain does not improve with other methods, such as medicine, exercise programs, and physical therapy. Tell a health care provider about:  Any allergies you have.  All medicines you are taking, including vitamins, herbs, eye drops, creams, and over-the-counter medicines.  Any problems you or family members have had with anesthetic medicines.  Any blood disorders you have.  Any surgeries you have had.  Any medical conditions you have or have had.  Whether you are pregnant or may be pregnant. What are the risks? Generally, this is a safe procedure. However, problems may occur, including:  Bleeding.  Injury to a nerve near the injection site.  Pain at the injection site.  Weakness or numbness in areas controlled by nerves near the injection site.  Infection.  Temporary fluid retention.  Allergic reactions to medicines or dyes.  Injury to other structures or organs near the injection site. What happens before the procedure? Medicines Ask your health care provider about:  Changing or stopping your regular medicines. This is especially important if you are taking diabetes medicines or blood thinners.  Taking medicines such as aspirin and ibuprofen. These medicines can thin your blood. Do not take these medicines unless your health care provider tells you to take them.  Taking over-the-counter medicines, vitamins, herbs, and supplements. Eating and drinking Follow instructions from your health care provider about  eating and drinking, which may include:  8 hours before the procedure - stop eating heavy meals or foods, such as meat, fried foods, or fatty foods.  6 hours before the procedure - stop eating light meals or foods, such as toast or cereal.  6 hours before the procedure - stop drinking milk or drinks  that contain milk.  2 hours before the procedure - stop drinking clear liquids. Staying hydrated Follow instructions from your health care provider about hydration, which may include:  Up to 2 hours before the procedure - you may continue to drink clear liquids, such as water, clear fruit juice, black coffee, and plain tea. General instructions  Do not use any products that contain nicotine or tobacco for at least 4-6 weeks before the procedure. These products include cigarettes, e-cigarettes, and chewing tobacco. If you need help quitting, ask your health care provider.  Plan to have someone take you home from the hospital or clinic.  Ask your health care provider: ? How your surgery site will be marked. ? What steps will be taken to help prevent infection. These may include:  Removing hair at the surgery site.  Washing skin with a germ-killing soap.  Receiving antibiotic medicine. What happens during the procedure?   You will put on a hospital gown.  You will lie on your stomach on an X-ray table. You may be asked to lie in a different position if an injection will be made in your neck.  Machines will be used to monitor your oxygen levels, heart rate, and blood pressure.  Your skin will be cleaned.  If an injection will be made in your neck, an IV will be inserted into one of your veins. Fluids and medicine will flow directly into your body through the IV.  A numbing medicine (local anesthetic) will be applied to your skin. Your skin may sting or burn for a moment.  A video X-ray machine (fluoroscopy) will be used to find the joint. In some cases, a CT scan may be used.  A  contrast dye may be injected into the facet joint area to help find the joint.  When the joint is located, an anesthetic will be injected into the joint through the needle.  Your health care provider will ask you whether you feel pain relief. ? If you feel relief, a steroid may be injected to provide pain relief for a longer period of time. ? If you do not feel relief or feel only partial relief, additional injections of an anesthetic may be made in other facet joints.  The needle will be removed.  Your skin will be cleaned.  A bandage (dressing) will be applied over each injection site. The procedure may vary among health care providers and hospitals. What happens after the procedure?  Your blood pressure, heart rate, breathing rate, and blood oxygen level will be monitored until you leave the hospital or clinic.  You will lie down and rest for a period of time. Summary  A facet joint block is a procedure in which a numbing medicine (anesthetic) is injected into a facet joint. An anti-inflammatory medicine (stereoid) may also be injected.  Follow instructions from your health care provider about medicines and eating and drinking before the procedure.  Do not use any products that contain nicotine or tobacco for at least 4-6 weeks before the procedure.  You will lie on your stomach for the procedure, but you may be asked to lie in a different position if an injection will be made in your neck.  When the joint is located, an anesthetic will be injected into the joint through the needle. This information is not intended to replace advice given to you by your health care provider. Make sure you discuss any questions you have with your health care provider. Document Revised: 12/22/2018  Document Reviewed: 08/05/2018 [employer] Patient Education  [employer].

## 2020-07-29 NOTE — Progress Notes (Signed)
Safety precautions to be maintained throughout the outpatient stay will include: orient to surroundings, keep bed in low position, maintain call bell within reach at all times, provide assistance with transfer out of bed and ambulation.  

## 2020-07-30 ENCOUNTER — Other Ambulatory Visit: Payer: Self-pay

## 2020-07-30 ENCOUNTER — Encounter: Payer: Self-pay | Admitting: Emergency Medicine

## 2020-07-30 ENCOUNTER — Emergency Department: Payer: Medicare Other

## 2020-07-30 ENCOUNTER — Telehealth: Payer: Self-pay

## 2020-07-30 ENCOUNTER — Emergency Department
Admission: EM | Admit: 2020-07-30 | Discharge: 2020-07-30 | Disposition: A | Discharge status: Home / Self Care | Payer: Medicare Other | Attending: Emergency Medicine | Admitting: Emergency Medicine

## 2020-07-30 DIAGNOSIS — Y92838 Other recreation area as the place of occurrence of the external cause: Secondary | ICD-10-CM | POA: Diagnosis not present

## 2020-07-30 DIAGNOSIS — Y9389 Activity, other specified: Secondary | ICD-10-CM | POA: Diagnosis not present

## 2020-07-30 DIAGNOSIS — S199XXA Unspecified injury of neck, initial encounter: Secondary | ICD-10-CM | POA: Diagnosis not present

## 2020-07-30 DIAGNOSIS — Y999 Unspecified external cause status: Secondary | ICD-10-CM | POA: Diagnosis not present

## 2020-07-30 DIAGNOSIS — W19XXXA Unspecified fall, initial encounter: Secondary | ICD-10-CM

## 2020-07-30 DIAGNOSIS — S0083XA Contusion of other part of head, initial encounter: Secondary | ICD-10-CM | POA: Insufficient documentation

## 2020-07-30 DIAGNOSIS — S8001XA Contusion of right knee, initial encounter: Secondary | ICD-10-CM | POA: Insufficient documentation

## 2020-07-30 DIAGNOSIS — Z23 Encounter for immunization: Secondary | ICD-10-CM | POA: Diagnosis not present

## 2020-07-30 DIAGNOSIS — S0990XA Unspecified injury of head, initial encounter: Secondary | ICD-10-CM | POA: Diagnosis present

## 2020-07-30 DIAGNOSIS — W010XXA Fall on same level from slipping, tripping and stumbling without subsequent striking against object, initial encounter: Secondary | ICD-10-CM | POA: Insufficient documentation

## 2020-07-30 DIAGNOSIS — S50812A Abrasion of left forearm, initial encounter: Secondary | ICD-10-CM | POA: Diagnosis not present

## 2020-07-30 MED ORDER — CYCLOBENZAPRINE HCL 10 MG PO TABS: 10.0000 mg | ORAL_TABLET | Freq: Four times a day (QID) | ORAL | 0 refills | Status: DC | PRN

## 2020-07-30 MED ORDER — TETANUS-DIPHTH-ACELL PERTUSSIS 5-2.5-18.5 LF-MCG/0.5 IM SUSY
0.5000 mL | PREFILLED_SYRINGE | Freq: Once | INTRAMUSCULAR | Status: AC
  Administered 2020-07-30: 0.5 mL via INTRAMUSCULAR
  Filled 2020-07-30: qty 0.5

## 2020-07-30 NOTE — ED Provider Notes (Signed)
[facility] Emergency Department Provider Note  ____________________________________________  Time seen: Approximately 4:05 PM  I have reviewed the triage vital signs and the nursing notes.   HISTORY  Chief Complaint Fall    HPI Grant Young is a 74 y.o. male who presents the emergency department via EMS after sustaining a fall.  Patient states that he and his wife were at [facility], he was attempting to walk through the lobby when his foot caused a raised platform that a statue sits on.  Patient states that he fell forward, striking his head reportedly on the ground.  Patient tried to catch himself, did sustain an abrasion to the left forearm.  He has a small superficial soft tissue injury to the right cheek as well.  He sustained no loss of consciousness.  He denies any visual changes or neck pain.  Patient states that he has a history of migraines, had a migraine before the fall but there is no change in his headache after the fall.  No subsequent loss of consciousness.  Patient sustained an abrasion to the left forearm and is experiencing mild forearm pain.  Patient also is complaining of mild right anterior knee pain about the patella.  Patient was ambulatory after this fall but was brought in by EMS.  Patient is not in a cervical collar.  Patient has received no medications or treatments via EMS in route.  Patient is taking Fioricet for his migraines, denies any anti-inflammatory use.  He denies any blood thinners.         Past Medical History:  Diagnosis Date  . Anxiety    Per New Patient Packet  . Chronic back pain    Per New Patient Packet  . Chronic heart disease    Per New Patient Packet  . Depression   . Headache   . History of CT scan of brain 09/14/2018   Per New Patient Packet  . History of depression    Per New Patient Packet  . History of gastritis    Per New Patient Packet  . History of headache    Per New Patient Packet  . History  of kidney stones    Per New Patient Packet  . History of neuropathy    Per New Patient Packet  . Hypertension    Per New Patient Packet  . Insomnia   . Kidney stone   . Lumbar radicular pain    Per New Patient Packet  . Lyme disease    Per New Patient Packet  . Skin cancer, basal cell   . Sleep trouble    Per New Patient Packet  . Squamous cell skin cancer   . Substance abuse [facility])     Patient Active Problem List   Diagnosis Date Noted  . Contracture of muscle of left foot 04/02/2020  . Closed dislocation of metatarsophalangeal (joint) 04/02/2020  . Dizziness, nonspecific 03/20/2020  . Hallux valgus, acquired 01/03/2020  . H/O foot surgery 01/03/2020  . Spinal cord stimulator status [employer]) 12/19/2019  . History of lumbar fusion 12/19/2019  . Lumbar spondylosis 12/19/2019  . 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 Chronic pain syndrome 12/19/2019  . Muscle pain, myofascial 12/19/2019  . Tremor 09/27/2019  . Chronic daily headache 09/27/2019  . HTN (hypertension), benign 09/21/2019  . ADHD (attention deficit hyperactivity disorder) 08/16/2019  . 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 Anxiety 05/30/2018  . End of battery life of  intrathecal infusion pump 04/01/2018  . Chronic radicular lumbar pain 07/13/2017  . Acute Lyme disease 01/30/2017  . Peripheral neuropathic pain 01/19/2017  . Testicular pain 01/19/2017  . Genitofemoral neuralgia, right 01/19/2017  . Gastroparesis 09/30/2014  . Gastritis 09/30/2014  . 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 Erectile dysfunction 01/03/2014  . Neuropathy 12/27/2013  . Colon cancer screening 08/24/2013  . BCC (basal cell carcinoma), leg 06/16/2012  . SCC (squamous cell carcinoma), hand 08/17/2011  . AK (actinic keratosis) 04/30/2011  . Neoplasm of uncertain behavior of skin 11/20/2010  . Pigmented skin lesions 06/05/2010  . Disorder of skin or subcutaneous tissue  12/05/2009  . Rash 06/06/2009  . Insomnia 10/24/2002  . Fatigue 10/24/2002  . Lumbago 04/03/2002  . Abdominal tenderness, epigastric 12/21/2001  . Headache 12/21/2001  . Depression 12/21/2001  . Other intervertebral disc degeneration, lumbosacral region 09/30/2001  . Hypercholesterolemia 09/30/2001  . Allergic rhinitis due to other allergen 09/30/2001  . Personal history of other diseases of digestive system 11/24/2000  . Other postprocedural status(V45.89) 03/14/1998  . Acute myocardial infarction ([place]) 07/18/1997  . 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 Calculus of kidney 01/26/1985    Past Surgical History:  Procedure Laterality Date  . BACK SURGERY    . CHOLECYSTECTOMY  2017  . COLONOSCOPY  09/15/2015   Per New Patient Packet  . FOOT SURGERY Bilateral 03/18/2020  . FOOT SURGERY  08/032021  . GALLBLADDER SURGERY  09/15/2015   Gallbladder Removal. Procedure done by Dr.Beverly. Per New Patient Packet  . KIDNEY STONE SURGERY  09/14/1980   Too many to count. Per New Patient Packet 09/14/1980-09/15/1995  . KIDNEY STONE SURGERY  09/15/1995   Too many to count. Per New Patient Packet  . LITHOTRIPSY  09/14/2014   Per New Patient Packet  . LITHOTRIPSY  09/14/1996   No Stints Used. Per New Patient Packet  . LITHOTRIPSY  09/14/1997   No Stints used. Per New Patient Packet  . PAIN PUMP IMPLANTATION    . PAIN PUMP REMOVAL    . SHOULDER SURGERY Right 1984  . SIGMOIDOSCOPY  09/14/2017   Per New Patient Packet  . SPINAL CORD STIMULATOR IMPLANT    . TONSILLECTOMY      Prior to Admission medications   Medication Sig Start Date End Date Taking? Authorizing Provider  acetaminophen (TYLENOL) 500 MG tablet Take by mouth. As needed 04/15/20   [provider]  amitriptyline (ELAVIL) 10 MG tablet Take 20 mg by mouth at bedtime.     [provider]  anastrozole (ARIMIDEX) 1 MG tablet Take 0.5 mg by  mouth 2 (two) times a week.     [provider]  butalbital-aspirin-caffeine Acquanetta Chain) 50-325-40 MG tablet Take 1 tablet by mouth as needed for headache.     [provider]  chlorthalidone (HYGROTON) 25 MG tablet Take 1 tablet (25 mg total) by mouth daily. 06/04/20   [name], NP  Cholecalciferol 125 MCG (5000 UT) TABS Take by mouth. 04/15/20   [provider]  Cyanocobalamin (VITAMIN B-12) 5000 MCG LOZG Take 1 lozenge by mouth daily.     [provider]  cyclobenzaprine (FLEXERIL) 10 MG tablet Take 1 tablet (10 mg total) by mouth 4 (four) times daily as needed for muscle spasms. 07/30/20   Aerial Dilley, Charline Bills, PA-C  dicyclomine (BENTYL) 20 MG tablet Take 20 mg by mouth 4 (four) times daily. 07/20/20   [provider]  Digestive Aids Mixture (  DIGESTION GB PO) Take 1 capsule by mouth daily.     [provider]  escitalopram (LEXAPRO) 20 MG tablet Take 1 tablet (20 mg total) by mouth daily. 06/04/20   [name], NP  ezetimibe (ZETIA) 10 MG tablet Take 1 tablet (10 mg total) by mouth daily. 06/04/20   [name], NP  gabapentin (NEURONTIN) 300 MG capsule Take 1 capsule (300 mg total) by mouth 3 (three) times daily. Patient not taking: Reported on 07/29/2020 06/04/20 10/02/20  [name], NP  GLUTATHIONE PO Take 1 tablet by mouth daily.  Patient not taking: Reported on 07/29/2020    [provider]  lidocaine (LIDODERM) 5 % Place onto the skin. Patient not taking: Reported on 07/29/2020    [provider]  Loperamide HCl (IMODIUM PO) Take by mouth as needed.     [provider]  LORazepam (ATIVAN) 0.5 MG tablet Take 1 tablet (0.5 mg total) by mouth 2 (two) times daily. 07/08/20   [name], NP  MAGNESIUM BISGLYCINATE PO Take 400 mg by mouth at bedtime.     [provider]  Melatonin 10 MG TABS Take 10 mg by mouth at bedtime.     [provider]  NATTOKINASE PO  Take 200 mg by mouth daily.     [provider]  NONFORMULARY OR COMPOUNDED ITEM Bi-Mix Injection  Papaverine HCI/Phentolamine Mesylate 150mg /55mg /Vial Inject 54ml prior to intercourse   For intracavernosal use only 06/19/20   Billey Co, MD  ondansetron (ZOFRAN) 4 MG tablet Take 4 mg by mouth as needed for nausea or vomiting.     [provider]  potassium chloride (KLOR-CON) 10 MEQ tablet Take by mouth. 06/10/20   [provider]  prochlorperazine (COMPAZINE) 10 MG tablet Take 10 mg by mouth as needed. 07/01/20   [provider]  propranolol (INDERAL) 40 MG tablet Take 1 tablet (40 mg total) by mouth in the morning and at bedtime. 06/04/20   [name], NP  RABEprazole (ACIPHEX) 20 MG tablet TAKE 1 TABLET BY MOUTH TWICE A DAY 07/22/20   [name], MD  sucralfate (CARAFATE) 1 g tablet Take 1 g by mouth 4 (four) times daily as needed.     [provider]  Testosterone Cypionate (DEPO-TESTOSTERONE IM) Inject 0.4 mLs into the muscle every 14 (fourteen) days.     [provider]  tizanidine (ZANAFLEX) 2 MG capsule Take 1 capsule (2 mg total) by mouth every 6 (six) hours as needed. 06/04/20   [name], NP  valACYclovir (VALTREX) 1000 MG tablet Take 2 tablets by mouth and repeat in 12 hours for cold sore 06/24/20   [name], NP    Allergies Ace inhibitors, Fluoxetine, Metoclopramide, Nalbuphine, Other, Amoxicillin-pot clavulanate, Doxazosin, Duloxetine, Penicillins, Tamsulosin, Trazodone and nefazodone, Amlodipine, Cinoxacin, Ciprofloxacin, Nebivolol, Olanzapine, Olmesartan, Pregabalin, Prostaglandins, Thyroid hormones, Zolpidem, Duloxetine hcl, Fluoxetine hcl, and Phenytoin  Family History  Problem Relation Age of Onset  . Heart disease Father   . Heart failure Father   . Hypertension Father   . Stroke Father   . Stroke Mother   . Dementia Mother   . Kidney Stones Daughter   . Anxiety disorder Daughter   .  OCD Daughter     Social History Social History   Tobacco Use  . Smoking status: Never Smoker  . Smokeless tobacco: Never Used  Vaping Use  . Vaping Use: Never used  Substance Use Topics  . Alcohol use:  Yes    Comment: 1 Drink a Month.  . Drug use: Not Currently     Review of Systems  Constitutional: No fever/chills Eyes: No visual changes. No discharge ENT: No upper respiratory complaints. Cardiovascular: no chest pain. Respiratory: no cough. No SOB. Gastrointestinal: No abdominal pain.  No nausea, no vomiting.  No diarrhea.  No constipation. Musculoskeletal: Positive for left forearm and right knee pain. Skin: Negative for rash, abrasions, lacerations, ecchymosis. Neurological: Migraine headache for follow.  Head trauma.  No change in headache.  Denies focal weakness or numbness.  10 System ROS otherwise negative.  ____________________________________________   PHYSICAL EXAM:  VITAL SIGNS: ED Triage Vitals [07/30/20 1600]  Enc Vitals Group     BP (!) 195/88     Pulse Rate 62     Resp 20     Temp 97.7 F (36.5 C)     Temp Source Oral     SpO2 99 %     Weight 141 lb 15.6 oz (64.4 kg)     Height 5\' 9"  (1.753 m)     Head Circumference      Peak Flow      Pain Score 4     Pain Loc      Pain Edu?      Excl. in [place]?      Constitutional: Alert and oriented. Well appearing and in no acute distress. Eyes: Conjunctivae are normal. PERRL. EOMI. Head: Superficial abrasion to the right cheek.  No frank laceration.  No active bleeding.  No visible foreign body.  Hematoma noted to the right frontal skull.  No other visible signs of trauma.  Patient is tender to palpation over the frontal skull but is nontender to the face.  No other tenderness to palpation of the osseous structures of the skull or face.  No battle signs, raccoon eyes, serosanguineous fluid drainage from ears or nares. ENT:      Ears:       Nose: No congestion/rhinnorhea.      Mouth/Throat: Mucous  membranes are moist.  Neck: No stridor.  No cervical spine tenderness to palpation.  Cardiovascular: Normal rate, regular rhythm. Normal S1 and S2.  Good peripheral circulation. Respiratory: Normal respiratory effort without tachypnea or retractions. Lungs CTAB. Good air entry to the bases with no decreased or absent breath sounds. Musculoskeletal: Full range of motion to all extremities. No gross deformities appreciated.  Visualization of the left forearm reveals a superficial abrasion wound no foreign body or active bleeding.  No frank lacerations.  No deformity.  Good range of motion to left elbow and left wrist.  Patient is tender to mid forearm with no palpable findings.  No visible deformity.  Radial pulse, sensation intact distally.  Examination of the right knee reveals no visible signs of trauma.  Good range of motion patient is amatory in the right knee without a limp.  Patient is mildly tender to palpation over the distal femur and patella.  No other significant areas of tenderness.  No palpable abnormality.  Special tests of the knee is unremarkable.  Dorsalis pedis pulses sensation intact distally. Neurologic:  Normal speech and language. No gross focal neurologic deficits are appreciated.  Cranial nerves II through XII grossly intact.  Negative Romberg's and pronator drift. Skin:  Skin is warm, dry and intact. No rash noted. Psychiatric: Mood and affect are normal. Speech and behavior are normal. Patient exhibits appropriate insight and judgement.   ____________________________________________   LABS (all labs ordered are  listed, but only abnormal results are displayed)  Labs Reviewed - No data to display ____________________________________________  EKG   ____________________________________________  RADIOLOGY I personally viewed and evaluated these images as part of my medical decision making, as well as reviewing the written report by the radiologist.  ED Provider  Interpretation: No acute traumatic injury on imaging.  Specifically no intracranial hemorrhage or skull fracture.  No cervical fracture.  No fracture to the left forearm or knee.  DG Forearm Left  Result Date: 07/30/2020 CLINICAL DATA:  74 year old male with fall and trauma to the left upper extremity. EXAM: LEFT FOREARM - 2 VIEW COMPARISON:  None. FINDINGS: There is no evidence of fracture or other focal bone lesions. Soft tissues are unremarkable. IMPRESSION: Negative. Electronically Signed   By: Anner Crete M.D.   On: 07/30/2020 17:19   CT Head Wo Contrast  Result Date: 07/30/2020 CLINICAL DATA:  Poly trauma, critical, head/cervical spine injury suspected. Additional history provided: Trip and fall, swelling to right forehead with small laceration to right cheek. EXAM: CT HEAD WITHOUT CONTRAST CT CERVICAL SPINE WITHOUT CONTRAST TECHNIQUE: Multidetector CT imaging of the head and cervical spine was performed following the standard protocol without intravenous contrast. Multiplanar CT image reconstructions of the cervical spine were also generated. COMPARISON:  CT angiogram head 03/06/2020. FINDINGS: CT HEAD FINDINGS Brain: Mild generalized cerebral atrophy. Redemonstrated chronic cortically based infarcts within the right frontal, parietal and occipital lobes, as well as left temporoparietal junction. Redemonstrated chronic infarcts within both cerebellar hemispheres. There is no acute intracranial hemorrhage. No acute demarcated cortical infarct is identified. No extra-axial fluid collection. No evidence of intracranial mass. No midline shift. Vascular: No hyperdense vessel.  Atherosclerotic calcifications. Skull: Normal. Negative for fracture or focal lesion. Sinuses/Orbits: Visualized orbits show no acute finding. Small left maxillary sinus mucous retention cyst. Mild ethmoid sinus mucosal thickening. Other: Subtle right forehead soft tissue swelling CT CERVICAL SPINE FINDINGS Alignment: Trace  C5-C6 grade 1 anterolisthesis. Skull base and vertebrae: The basion-dental and atlanto-dental intervals are maintained.No evidence of acute fracture to the cervical spine. Soft tissues and spinal canal: No prevertebral fluid or swelling. No visible canal hematoma. Disc levels: Cervical spondylosis. Most notably at C6-C7, there is moderate/advanced disc space narrowing with a disc bulge and uncovertebral hypertrophy. Upper chest: No consolidation within the imaged lung apices. No visible pneumothorax IMPRESSION: CT head: 1. No evidence of acute intracranial abnormality. 2. Mild right forehead soft tissue swelling. 3. Redemonstrated chronic cortically based infarcts within the right frontal, parietal and occipital lobes, as well as left temporoparietal junction. 4. Redemonstrated chronic infarcts within the bilateral cerebellar hemispheres. 5. Paranasal sinus disease as described. CT cervical spine: 1. No evidence of acute fracture to the cervical spine. 2. Mild C5-C6 grade 1 anterolisthesis. 3. Cervical spondylosis as described and greatest at C6-C7. Electronically Signed   By: Kellie Simmering DO   On: 07/30/2020 17:26   CT Cervical Spine Wo Contrast  Result Date: 07/30/2020 CLINICAL DATA:  Poly trauma, critical, head/cervical spine injury suspected. Additional history provided: Trip and fall, swelling to right forehead with small laceration to right cheek. EXAM: CT HEAD WITHOUT CONTRAST CT CERVICAL SPINE WITHOUT CONTRAST TECHNIQUE: Multidetector CT imaging of the head and cervical spine was performed following the standard protocol without intravenous contrast. Multiplanar CT image reconstructions of the cervical spine were also generated. COMPARISON:  CT angiogram head 03/06/2020. FINDINGS: CT HEAD FINDINGS Brain: Mild generalized cerebral atrophy. Redemonstrated chronic cortically based infarcts within the right frontal, parietal and occipital  lobes, as well as left temporoparietal junction. Redemonstrated  chronic infarcts within both cerebellar hemispheres. There is no acute intracranial hemorrhage. No acute demarcated cortical infarct is identified. No extra-axial fluid collection. No evidence of intracranial mass. No midline shift. Vascular: No hyperdense vessel.  Atherosclerotic calcifications. Skull: Normal. Negative for fracture or focal lesion. Sinuses/Orbits: Visualized orbits show no acute finding. Small left maxillary sinus mucous retention cyst. Mild ethmoid sinus mucosal thickening. Other: Subtle right forehead soft tissue swelling CT CERVICAL SPINE FINDINGS Alignment: Trace C5-C6 grade 1 anterolisthesis. Skull base and vertebrae: The basion-dental and atlanto-dental intervals are maintained.No evidence of acute fracture to the cervical spine. Soft tissues and spinal canal: No prevertebral fluid or swelling. No visible canal hematoma. Disc levels: Cervical spondylosis. Most notably at C6-C7, there is moderate/advanced disc space narrowing with a disc bulge and uncovertebral hypertrophy. Upper chest: No consolidation within the imaged lung apices. No visible pneumothorax IMPRESSION: CT head: 1. No evidence of acute intracranial abnormality. 2. Mild right forehead soft tissue swelling. 3. Redemonstrated chronic cortically based infarcts within the right frontal, parietal and occipital lobes, as well as left temporoparietal junction. 4. Redemonstrated chronic infarcts within the bilateral cerebellar hemispheres. 5. Paranasal sinus disease as described. CT cervical spine: 1. No evidence of acute fracture to the cervical spine. 2. Mild C5-C6 grade 1 anterolisthesis. 3. Cervical spondylosis as described and greatest at C6-C7. Electronically Signed   By: Kellie Simmering DO   On: 07/30/2020 17:26   DG Knee Complete 4 Views Right  Result Date: 07/30/2020 CLINICAL DATA:  74 year old male with fall and trauma to the right knee. EXAM: RIGHT KNEE - COMPLETE 4+ VIEW COMPARISON:  None. FINDINGS: There is no acute  fracture or dislocation. The bones are mildly osteopenic. Mild arthritic changes of the knee. No joint effusion. The soft tissues are unremarkable. IMPRESSION: No acute fracture or dislocation. Electronically Signed   By: Anner Crete M.D.   On: 07/30/2020 17:21    ____________________________________________    PROCEDURES  Procedure(s) performed:    Procedures    Medications  Tdap (BOOSTRIX) injection 0.5 mL (0.5 mLs Intramuscular Given 07/30/20 1705)     ____________________________________________   INITIAL IMPRESSION / ASSESSMENT AND PLAN / ED COURSE  Pertinent labs & imaging results that were available during my care of the patient were reviewed by me and considered in my medical decision making (see chart for details).  Review of the  [place]RS was performed in accordance of the [place] prior to dispensing any controlled drugs.           Patient's diagnosis is consistent with fall, forehead hematoma, abrasion to the face and arm.  Patient presented to emergency department after tripping and falling and striking his head.  No loss of consciousness.  Patient was neurologically intact on exam.  Imaging reveals no acute traumatic findings in the cervical spine, head, forearm or knee.. Patient will be discharged home with prescriptions for muscle relaxer for symptom relief.  Otherwise use Tylenol.. Patient is to follow up with primary care as needed or otherwise directed. Patient is given ED precautions to return to the ED for any worsening or new symptoms.     ____________________________________________  FINAL CLINICAL IMPRESSION(S) / ED DIAGNOSES  Final diagnoses:  Fall, initial encounter  Contusion of face, initial encounter  Abrasion of left forearm, initial encounter  Contusion of right knee, initial encounter      NEW MEDICATIONS STARTED DURING THIS VISIT:  ED Discharge Orders  Ordered    cyclobenzaprine (FLEXERIL) 10 MG tablet  4 times daily PRN         07/30/20 1842              This chart was dictated using voice recognition software/Dragon. Despite best efforts to proofread, errors can occur which can change the meaning. Any change was purely unintentional.    Darletta Moll, PA-C 07/30/20 1843    Duffy Bruce, MD 08/01/20 [id number]

## 2020-07-30 NOTE — ED Triage Notes (Signed)
Patient to ER via [facility] from [place] after tripping and falling. Patient states he was trying to walk through statues, didn't realize there was a step up and tripped. Patient has swelling to right forehead with small lac to right cheek. Also c/o mild pain to right knee. Patient did not lose consciousness, denies neck or back pain.

## 2020-07-30 NOTE — Telephone Encounter (Signed)
Post proceddure phone call.  Patient states he is doing good.

## 2020-08-03 ENCOUNTER — Encounter: Payer: Self-pay | Admitting: Nurse Practitioner

## 2020-08-03 ENCOUNTER — Other Ambulatory Visit: Payer: Self-pay | Admitting: Nurse Practitioner

## 2020-08-03 DIAGNOSIS — G8929 Other chronic pain: Secondary | ICD-10-CM

## 2020-08-03 DIAGNOSIS — F419 Anxiety disorder, unspecified: Secondary | ICD-10-CM

## 2020-08-03 DIAGNOSIS — M545 Other chronic pain: Secondary | ICD-10-CM

## 2020-08-06 ENCOUNTER — Other Ambulatory Visit: Payer: Self-pay | Admitting: Nurse Practitioner

## 2020-08-06 DIAGNOSIS — M545 Other chronic pain: Secondary | ICD-10-CM

## 2020-08-06 DIAGNOSIS — G8929 Other chronic pain: Secondary | ICD-10-CM

## 2020-08-06 MED ORDER — TIZANIDINE HCL 2 MG PO CAPS: 2.0000 mg | ORAL_CAPSULE | Freq: Four times a day (QID) | ORAL | 1 refills | Status: DC | PRN

## 2020-08-19 ENCOUNTER — Other Ambulatory Visit: Payer: Self-pay | Admitting: Internal Medicine

## 2020-08-19 DIAGNOSIS — R748 Abnormal levels of other serum enzymes: Secondary | ICD-10-CM

## 2020-08-27 ENCOUNTER — Telehealth: Payer: Self-pay | Admitting: Nurse Practitioner

## 2020-08-27 NOTE — Telephone Encounter (Signed)
He can be a new pt. He will have to be placed in a 30 min slot for TOC. But make him aware that Dr Silvio Pate will be retiring in about 4-5 years.

## 2020-08-27 NOTE — Telephone Encounter (Signed)
Pt called in wanted to know about getting on the schedule for new patient with Dr. Silvio Pate. Call back number is [phone]

## 2020-08-29 ENCOUNTER — Ambulatory Visit
Admission: RE | Admit: 2020-08-29 | Discharge: 2020-08-29 | Disposition: A | Discharge status: Home / Self Care | Payer: Medicare Other | Source: Ambulatory Visit | Attending: Internal Medicine | Admitting: Internal Medicine

## 2020-08-29 ENCOUNTER — Other Ambulatory Visit: Payer: Self-pay

## 2020-08-29 DIAGNOSIS — R748 Abnormal levels of other serum enzymes: Secondary | ICD-10-CM | POA: Insufficient documentation

## 2020-09-02 ENCOUNTER — Other Ambulatory Visit: Payer: Self-pay | Admitting: *Deleted

## 2020-09-02 ENCOUNTER — Other Ambulatory Visit: Payer: Self-pay | Admitting: Nurse Practitioner

## 2020-09-02 DIAGNOSIS — F419 Anxiety disorder, unspecified: Secondary | ICD-10-CM

## 2020-09-02 NOTE — Telephone Encounter (Signed)
[facility] patient requesting refill on Lorazepam.  Pended Rx and sent to [facility] for approval.  No Contract on File.  Epic LR: 08/05/2020

## 2020-09-03 ENCOUNTER — Encounter: Payer: Self-pay | Admitting: Nurse Practitioner

## 2020-09-03 MED ORDER — LORAZEPAM 0.5 MG PO TABS: 0.5000 mg | ORAL_TABLET | Freq: Two times a day (BID) | ORAL | 0 refills | Status: DC

## 2020-09-16 ENCOUNTER — Encounter: Payer: Self-pay | Admitting: Student in an Organized Health Care Education/Training Program

## 2020-09-16 ENCOUNTER — Encounter: Payer: Self-pay | Admitting: Gastroenterology

## 2020-09-16 ENCOUNTER — Other Ambulatory Visit: Payer: Self-pay

## 2020-09-16 ENCOUNTER — Ambulatory Visit (INDEPENDENT_AMBULATORY_CARE_PROVIDER_SITE_OTHER): Payer: Medicare Other | Admitting: Gastroenterology

## 2020-09-16 VITALS — BP 170/72 | HR 64 | Temp 98.1°F | Ht 69.0 in | Wt 141.8 lb

## 2020-09-16 DIAGNOSIS — K219 Gastro-esophageal reflux disease without esophagitis: Secondary | ICD-10-CM

## 2020-09-16 DIAGNOSIS — K9089 Other intestinal malabsorption: Secondary | ICD-10-CM | POA: Diagnosis not present

## 2020-09-16 DIAGNOSIS — Z1211 Encounter for screening for malignant neoplasm of colon: Secondary | ICD-10-CM

## 2020-09-16 DIAGNOSIS — L290 Pruritus ani: Secondary | ICD-10-CM

## 2020-09-16 DIAGNOSIS — R748 Abnormal levels of other serum enzymes: Secondary | ICD-10-CM

## 2020-09-16 MED ORDER — HYDROCORTISONE ACETATE 25 MG RE SUPP: 25.0000 mg | Freq: Every evening | RECTAL | 0 refills | Status: DC | PRN

## 2020-09-16 NOTE — Addendum Note (Signed)
Addended by: Avie Arenas on: 09/16/2020 03:41 PM   Modules accepted: Orders, SmartSet

## 2020-09-16 NOTE — Patient Instructions (Signed)

## 2020-09-16 NOTE — Progress Notes (Signed)
[name] MD, MRCP(U.K) [address]  [employer]  [place], [place] [address]  Main: [phone]  Fax: [phone]   Primary Care Physician: [name], NP  Primary Gastroenterologist:  Dr. [name]   Chief Complaint  Patient presents with  . Hemorrhoids    HPI: Grant Young is a 75 y.o. male    Summary of history :  He was initially referred and seen in February 2021 for GERD.  He has previously stated that he has had upper abdominal pain for many years below his xiphisternum, nonradiating and sharp in nature. .  It usually occurs in the morning and he is woken up from the same.  Gets better when he eats or sometimes worse with certain foods.  No other clear aggravating factors no clear relieving factors.  Not affected by bowel movement.  Had a soft bowel movement daily.  Denied  any NSAID use. ,  Stress does make it worse.  Not sleeping adequately makes it worse.  He has been on AcipHex which helps.  Last episode of this pain he had was more than a few weeks back.  Since then he has been episode free.  He has had his gallbladder taken out in 2017.  He has been evaluated in the past by a gastroenterologist in [place] and underwent an upper endoscopy per his recollection which she recalls was normal except for a few small polyps.  He has also had a colonoscopy in the past.   At his initial visit felt that his symptoms are suggestive of dyspepsia.  AcipHex which he was on was giving him good control of his symptoms.  Plan was to try him on peppermint oil capsules which she was provided samples of which.  Follow-up as needed.  Interval history 10/26/2019-09/16/2020 Call our office in November 2021 with diarrhea.  C. difficile toxin was negative. He says he is here to see me for an elevated alkaline phosphatase level which was isolated and been elevated since 3 months.  He had his gamma GT checked which was negative.  Ultrasound of the liver shows no biliary obstruction.   He complains of pain in the right lower quadrant radiating from the back.  On and off episodic.  He has a history of diarrhea.  For many years.  No change.  Has cholestyramine available with him but has not taken it.  He is overdue for his screening colonoscopy as his mother had colon cancer.  He also has issues of perianal pruritus and feels like he may have skin tags.  He also complains of acid reflux, heartburn which requires more than AcipHex to control symptoms has been taking Gaviscon in addition.  Thinks he might have tried Dexilant in the past which may or may not have helped.    Current Outpatient Medications  Medication Sig Dispense Refill  . amitriptyline (ELAVIL) 10 MG tablet Take 20 mg by mouth at bedtime.     Marland Kitchen anastrozole (ARIMIDEX) 1 MG tablet Take 0.5 mg by mouth 2 (two) times a week.     . butalbital-acetaminophen-caffeine (FIORICET) 50-325-40 MG tablet Take by mouth.    . butalbital-aspirin-caffeine (FIORINAL) 50-325-40 MG tablet Take 1 tablet by mouth as needed for headache.     . chlorthalidone (HYGROTON) 25 MG tablet Take 1 tablet (25 mg total) by mouth daily. 90 tablet 1  . Cholecalciferol 125 MCG (5000 UT) TABS Take by mouth.    . cholestyramine (QUESTRAN) 4 g packet     .  Cyanocobalamin (VITAMIN B-12) 5000 MCG LOZG Take 1 lozenge by mouth daily.     Marland Kitchen dicyclomine (BENTYL) 20 MG tablet Take 20 mg by mouth 4 (four) times daily.    . Digestive Aids Mixture (DIGESTION GB PO) Take 1 capsule by mouth daily.     Marland Kitchen escitalopram (LEXAPRO) 20 MG tablet Take 1 tablet (20 mg total) by mouth daily. 90 tablet 1  . ezetimibe (ZETIA) 10 MG tablet Take 1 tablet (10 mg total) by mouth daily. 90 tablet 1  . GLUTATHIONE PO Take 1 tablet by mouth daily.    . Loperamide HCl (IMODIUM PO) Take by mouth as needed.     Marland Kitchen LORazepam (ATIVAN) 0.5 MG tablet Take 1 tablet (0.5 mg total) by mouth 2 (two) times daily. 60 tablet 0  . MAGNESIUM BISGLYCINATE PO Take 400 mg by mouth at bedtime.     .  Melatonin 10 MG TABS Take 10 mg by mouth at bedtime.     . NONFORMULARY OR COMPOUNDED ITEM Bi-Mix Injection  Papaverine HCI/Phentolamine Mesylate 150mg /55mg /Vial Inject 30ml prior to intercourse   For intracavernosal use only 5 each 11  . ondansetron (ZOFRAN) 4 MG tablet Take 4 mg by mouth as needed for nausea or vomiting.     . potassium chloride (KLOR-CON) 10 MEQ tablet Take by mouth.    . prochlorperazine (COMPAZINE) 10 MG tablet Take 10 mg by mouth as needed.    . propranolol (INDERAL) 40 MG tablet Take 1 tablet (40 mg total) by mouth in the morning and at bedtime. 180 tablet 1  . RABEprazole (ACIPHEX) 20 MG tablet TAKE 1 TABLET BY MOUTH TWICE A DAY 180 tablet 1  . sucralfate (CARAFATE) 1 g tablet Take 1 g by mouth 4 (four) times daily as needed.     . Testosterone Cypionate (DEPO-TESTOSTERONE IM) Inject 0.4 mLs into the muscle every 14 (fourteen) days.     . tizanidine (ZANAFLEX) 2 MG capsule Take 1 capsule (2 mg total) by mouth every 6 (six) hours as needed. 120 capsule 1  . valACYclovir (VALTREX) 1000 MG tablet Take 2 tablets by mouth and repeat in 12 hours for cold sore 4 tablet 1   No current facility-administered medications for this visit.    Allergies as of 09/16/2020 - Review Complete 09/16/2020  Allergen Reaction Noted  . Ace inhibitors  08/24/2019  . Fluoxetine Anxiety 10/31/2019  . Metoclopramide  10/31/2019  . Nalbuphine  10/31/2019  . Other  01/03/2014  . Amoxicillin-pot clavulanate Nausea Only 10/31/2019  . Doxazosin Rash 02/01/2013  . Duloxetine Nausea Only 10/31/2019  . Penicillins Nausea Only 10/31/2019  . Tamsulosin Itching and Anxiety 02/01/2013  . Trazodone and nefazodone Itching, Anxiety, and Rash 02/13/2012  . Amlodipine  10/31/2019  . Cinoxacin  10/31/2019  . Ciprofloxacin  08/24/2019  . Nebivolol  08/24/2019  . Olanzapine  10/31/2019  . Olmesartan  08/24/2019  . Pregabalin  10/31/2019  . Prostaglandins  05/07/2014  . Thyroid hormones  06/09/2018   . Zolpidem  10/31/2019  . Duloxetine hcl  01/03/2014  . Fluoxetine hcl  01/03/2014  . Phenytoin Anxiety 10/31/2019    ROS:  General: Negative for anorexia, weight loss, fever, chills, fatigue, weakness. ENT: Negative for hoarseness, difficulty swallowing , nasal congestion. CV: Negative for chest pain, angina, palpitations, dyspnea on exertion, peripheral edema.  Respiratory: Negative for dyspnea at rest, dyspnea on exertion, cough, sputum, wheezing.  GI: See history of present illness. GU:  Negative for dysuria, hematuria, urinary incontinence, urinary frequency,  nocturnal urination.  Endo: Negative for unusual weight change.    Physical Examination:   BP (!) 170/72 (BP Location: Left Arm, Patient Position: Sitting, Cuff Size: Normal)   Pulse 64   Temp 98.1 F (36.7 C)   Ht 5\' 9"  (1.753 m)   Wt 141 lb 12.8 oz (64.3 kg)   BMI 20.94 kg/m   General: Well-nourished, well-developed in no acute distress.  Eyes: No icterus. Conjunctivae pink. Neuro: Alert and oriented x 3.  Grossly intact. Skin: Warm and dry, no jaundice.   Psych: Alert and cooperative, normal mood and affect.   Imaging Studies: Abdomen Limited RUQ (LIVER/GB)  Result Date: 08/29/2020 CLINICAL DATA:  Abnormal levels of other serum enzymes EXAM: ULTRASOUND ABDOMEN LIMITED RIGHT UPPER QUADRANT COMPARISON:  06/19/2020. FINDINGS: Gallbladder: Surgically absent. Common bile duct: Diameter: 6.7 mm Liver: No focal lesion identified. Within normal limits in parenchymal echogenicity. Portal vein is patent on color Doppler imaging with normal direction of blood flow towards the liver. Other: None. IMPRESSION: Normal sonographic appearance of the liver. Sequela of cholecystectomy. Electronically Signed   By: 08/19/2020 M.D.   On: 08/29/2020 14:46    Assessment and Plan:   DUTCH ING is a 75 y.o. y/o male here today to see me for isolated elevation of alkaline phosphatase.  Less than 2 times upper limit of  normal.  Gamma GT has been negative.  Right upper quadrant ultrasound negative.  Differentials could include conditions such as Paget's disease or vitamin D deficiency.  I will obtain further work-up including liver work-up.  He has a worsening of acid reflux despite being on AcipHex.  I will proceed with upper endoscopy and give him samples of Dexilant to try as an alternative to AcipHex.  His mother had colon cancer and is due for colon cancer screening and I will set him up for a colonoscopy at the same time.  His diarrhea could be bile salt related diarrhea and I have suggested him to take 1 packet of cholestyramine daily.  I have counseled him about conservative management of internal hemorrhoids including provided him patient information and discussed about perianal hygiene.  If it does not help in 4 to 5 weeks time consider banding of the internal hemorrhoids  I have discussed alternative options, risks & benefits,  which include, but are not limited to, bleeding, infection, perforation,respiratory complication & drug reaction.  The patient agrees with this plan & written consent will be obtained.       Dr 61  MD,MRCP [facility]) Follow up in 4 to 6 weeks

## 2020-09-17 ENCOUNTER — Ambulatory Visit
Payer: Worker's Compensation | Attending: Student in an Organized Health Care Education/Training Program | Admitting: Student in an Organized Health Care Education/Training Program

## 2020-09-17 ENCOUNTER — Telehealth (HOSPITAL_BASED_OUTPATIENT_CLINIC_OR_DEPARTMENT_OTHER): Payer: Worker's Compensation | Admitting: Student in an Organized Health Care Education/Training Program

## 2020-09-17 ENCOUNTER — Encounter: Payer: Self-pay | Admitting: Student in an Organized Health Care Education/Training Program

## 2020-09-17 ENCOUNTER — Other Ambulatory Visit: Payer: Self-pay

## 2020-09-17 VITALS — BP 165/76 | HR 64 | Temp 97.2°F | Resp 18 | Ht 69.0 in | Wt 140.0 lb

## 2020-09-17 DIAGNOSIS — G894 Chronic pain syndrome: Secondary | ICD-10-CM | POA: Diagnosis present

## 2020-09-17 DIAGNOSIS — M47816 Spondylosis without myelopathy or radiculopathy, lumbar region: Secondary | ICD-10-CM

## 2020-09-17 MED ORDER — HYDROCODONE-ACETAMINOPHEN 10-325 MG PO TABS: 1.0000 | ORAL_TABLET | Freq: Two times a day (BID) | ORAL | 0 refills | Status: DC | PRN

## 2020-09-17 NOTE — Patient Instructions (Signed)

## 2020-09-17 NOTE — Progress Notes (Signed)
Safety precautions to be maintained throughout the outpatient stay will include: orient to surroundings, keep bed in low position, maintain call bell within reach at all times, provide assistance with transfer out of bed and ambulation.  

## 2020-09-17 NOTE — Progress Notes (Signed)
PROVIDER NOTE: Information contained herein reflects review and annotations entered in association with encounter. Interpretation of such information and data should be left to medically-trained personnel. Information provided to patient can be located elsewhere in the medical record under "Patient Instructions". Document created using STT-dictation technology, any transcriptional errors that may result from process are unintentional.    Patient: Grant Young  Service Category: E/M  Provider: [name], MD  DOB: 1946/04/01  DOS: 09/17/2020  Specialty: Interventional Pain Management  MRN: [id number]  Setting: Ambulatory outpatient  PCP: Grant Chandler, NP  Type: Established Patient    Referring Provider: [name], NP  Location: Office  Delivery: Face-to-face     HPI  [name], a 75 y.o. year old male, is here today because of his Lumbar facet arthropathy [M47.816]. [name] primary complain today is Back Pain (lower) Last encounter: My last encounter with him was on 07/29/2020. Pertinent problems: 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 Pain Assessment: Severity of Chronic pain is reported as a 7 /10. Location: Back  /denies. Onset: More than a month ago. Quality: Shooting,Stabbing,Sharp. Timing: Constant. Modifying factor(s): ice. Vitals:  height is '5\' 9"'  (1.753 m) and weight is 140 lb (63.5 kg). His temporal temperature is 97.2 F (36.2 C) (abnormal). His blood pressure is 165/76 (abnormal) and his pulse is 64. His respiration is 18 and oxygen saturation is 100%.   Reason for encounter: worsening of previously known (established) problem    Patient had therapeutic bilateral L3,4,5 medial branch nerve blocks on 07/29/20  that he tolerated well and was receiving analgesic and functional benefit until he fell (due to tripping accident) on 07/30/20. Patient states that he fell forward, striking his head reportedly on the ground.  Patient tried to catch himself, did sustain an abrasion to the left forearm.  He has a small superficial soft tissue injury to the right cheek as well.  He sustained no loss of consciousness.  He denies any visual changes or neck pain. Went to ED.   Increased low back pain secondary to fall.  Discussed repeating lumbar facet medial branch nerve blocks and also considering lumbar radiofrequency ablation afterwards.  Risk and benefits reviewed and patient like to proceed.  Patient also requesting a short supply of hydrocodone for his acute on chronic pain.  He has done well with 10 mg of hydrocodone for breakthrough pain in the past.  Recommend that he take Dulcolax/stool softener with his hydrocodone and if he goes more than 2 days without having a bowel movement to supplement with MiraLAX.  Patient is also having pain in his right upper quadrant, thinks is related to his appendix.  Is also having flank pain related to kidney stones.  His headaches continue and they vary from a 6 to a 8 on a 10 point scale.  Patient is having difficulty walking and is also having trouble doing simple core exercises.  Of note, the patient has had 2 series of diagnostic lumbar facet medial branch nerve blocks, the first 1 was done on 02/28/2020 that provided approximately 3 months of pain relief rated as 60% in his low back region however patient sustained a fall a day after his previous medial branch nerve block procedure that was done on 07/29/2020 so hard to gauge how much  pain relief that actually provided.  Recommend repeating once more prior to considering RFA.  Continue with spinal cord stimulation.  ROS  Constitutional: Denies any fever or chills Gastrointestinal: No reported hemesis, hematochezia, vomiting, or  acute GI distress Musculoskeletal: Increased low back pain secondary to fall. Neurological: No reported episodes of acute onset apraxia, aphasia, dysarthria, agnosia, amnesia, paralysis, loss of coordination, or loss of consciousness  Medication Review  Cholecalciferol, Digestive Aids Mixture, Glutathione, HYDROcodone-acetaminophen, LORazepam, Loperamide HCl, Magnesium Bisglycinate, Melatonin, NONFORMULARY OR COMPOUNDED ITEM, RABEprazole, Testosterone Cypionate, Vitamin B-12, amitriptyline, anastrozole, butalbital-acetaminophen-caffeine, butalbital-aspirin-caffeine, chlorthalidone, cholestyramine, dicyclomine, escitalopram, ezetimibe, hydrocortisone, ondansetron, potassium chloride, prochlorperazine, propranolol, sucralfate, tizanidine, and valACYclovir  History Review  Allergy: [name] is allergic to ace inhibitors, fluoxetine, metoclopramide, nalbuphine, other, amoxicillin-pot clavulanate, doxazosin, duloxetine, penicillins, tamsulosin, trazodone and nefazodone, amlodipine, cinoxacin, ciprofloxacin, nebivolol, olanzapine, olmesartan, pregabalin, prostaglandins, thyroid hormones, zolpidem, duloxetine hcl, fluoxetine hcl, and phenytoin. Drug: [name]  reports previous drug use. Alcohol:  reports current alcohol use. Tobacco:  reports that he has never smoked. He has never used smokeless tobacco. Social: [name]  reports that he has never smoked. He has never used smokeless tobacco. He reports current alcohol use. He reports previous drug use. Medical:  has a past medical history of Anxiety, Chronic back pain, Chronic heart disease, Depression, Headache, History of CT scan of brain (09/14/2018), History of depression, History of gastritis, History of headache, History of kidney stones, History of neuropathy, Hypertension, Insomnia, Kidney stone, Lumbar radicular pain, Lyme disease, Skin cancer, basal cell, Sleep trouble, Squamous cell skin cancer, and Substance abuse ([place]). Surgical: [name]  has a  past surgical history that includes Kidney stone surgery (09/14/1980); Kidney stone surgery (09/15/1995); Lithotripsy (09/14/2014); Lithotripsy (09/14/1996); Lithotripsy (09/14/1997); Gallbladder surgery (09/15/2015); Sigmoidoscopy (09/14/2017); Colonoscopy (09/15/2015); Cholecystectomy (2017); Shoulder surgery (Right, 1984); Back surgery; Spinal cord stimulator implant; Pain pump implantation; Pain pump removal; Tonsillectomy; Foot surgery (Bilateral, 03/18/2020); and Foot surgery (08/032021). Family: family history includes Anxiety disorder in his daughter; Dementia in his mother; Heart disease in his father; Heart failure in his father; Hypertension in his father; Kidney Stones in his daughter; OCD in his daughter; Stroke in his father and mother.  Laboratory Chemistry Profile   Renal Lab Results  Component Value Date   BUN 1 (A) 05/20/2020   CREATININE 0.90 03/06/2020   GFRAA 101 05/20/2020   GFRNONAA 87 05/20/2020     Hepatic Lab Results  Component Value Date   AST 18 05/20/2020   ALT 24 05/20/2020   ALBUMIN 4.2 05/20/2020   ALKPHOS 149 (H) 09/16/2020     Electrolytes Lab Results  Component Value Date   NA 145 05/20/2020   K 3.2 (A) 05/20/2020   CL 102 05/20/2020   CALCIUM 9.8 05/20/2020     Bone Lab Results  Component Value Date   TESTOSTERONE 33.2 05/20/2020     Inflammation (CRP: Acute Phase) (ESR: Chronic Phase) No results found for: CRP, ESRSEDRATE, LATICACIDVEN     Note: Above Lab results reviewed.  Recent Imaging Review  US Abdomen Limited RUQ (LIVER/GB) CLINICAL DATA:  Abnormal levels of other serum enzymes  EXAM: ULTRASOUND ABDOMEN LIMITED RIGHT UPPER QUADRANT  COMPARISON:  06/19/2020.  FINDINGS: Gallbladder:  Surgically absent.  Common bile duct:  Diameter: 6.7 mm  Liver:  No focal lesion identified. Within normal limits in parenchymal echogenicity. Portal vein is patent on color Doppler imaging with normal direction of blood flow towards  the liver.  Other: None.  IMPRESSION: Normal sonographic appearance of the  liver.  Sequela of cholecystectomy.  Electronically Signed   By: Primitivo Gauze M.D.   On: 08/29/2020 14:46 Note: Reviewed        Physical Exam  General appearance: Well nourished, well developed, and well hydrated. In no apparent acute distress Mental status: Alert, oriented x 3 (person, place, & time)       Respiratory: No evidence of acute respiratory distress Eyes: PERLA Vitals: BP (!) 165/76   Pulse 64   Temp (!) 97.2 F (36.2 C) (Temporal)   Resp 18   Ht '5\' 9"'  (1.753 m)   Wt 140 lb (63.5 kg)   SpO2 100%   BMI 20.67 kg/m  BMI: Estimated body mass index is 20.67 kg/m as calculated from the following:   Height as of this encounter: '5\' 9"'  (1.753 m).   Weight as of this encounter: 140 lb (63.5 kg). Ideal: Ideal body weight: 70.7 kg (155 lb 13.8 oz)   Lumbar Spine Area Exam  Skin & Axial Inspection: Well healed scar from previous spine surgery detected, IPG and spinal cord stimulator in place Alignment: Symmetrical Functional ROM: Restricted ROM       Stability: No instability detected Muscle Tone/Strength: Functionally intact. No obvious neuro-muscular anomalies detected. Sensory (Neurological): Musculoskeletal pain pattern Palpation: No palpable anomalies       Provocative Tests: Hyperextension/rotation test: (+) bilaterally for facet joint pain.   Assessment   Status Diagnosis  Having a Flare-up Having a Flare-up Persistent 1. Lumbar facet arthropathy   2. Lumbar spondylosis   3. Chronic pain syndrome       Plan of Care   [name] has a current medication list which includes the following long-term medication(s): amitriptyline, chlorthalidone, cholestyramine, dicyclomine, escitalopram, ezetimibe, potassium chloride, propranolol, rabeprazole, sucralfate, and testosterone cypionate.  Pharmacotherapy (Medications Ordered): Meds ordered this encounter  Medications   . HYDROcodone-acetaminophen (NORCO) 10-325 MG tablet    Sig: Take 1 tablet by mouth 2 (two) times daily as needed for severe pain. Must last 30 days.    Dispense:  60 tablet    Refill:  0    Chronic Pain. (STOP Act - Not applicable). Fill one day early if closed on scheduled refill date.   Orders:  Orders Placed This Encounter  Procedures  . LUMBAR FACET(MEDIAL BRANCH NERVE BLOCK) MBNB    Standing Status:   Future    Standing Expiration Date:   10/18/2020    Scheduling Instructions:     Procedure: Lumbar facet block (AKA.: Lumbosacral medial branch nerve block)     Side: Bilateral     Level: L3-4, L4-5, Facets ( L3, L4, L5,  Medial Branch Nerves)     Sedation: Patient's choice.     Timeframe: ASAA    Order Specific Question:   Where will this procedure be performed?    Answer:   [facility]  . [facility], Ur    Volume: 30 ml(s). Minimum 3 ml of urine is needed. Document temperature of fresh sample. Indications: Long term (current) use of opiate analgesic (Q30.092) Test#: [id number] (Comprehensive Profile)    Order Specific Question:   Release to patient    Answer:   Immediate   Consider lumbar RFA in future.  Follow-up plan:   Return in about 1 week (around 09/24/2020) for B/L L3, 4, 5 Facet blocks, without sedation.     Status post Botox No. 1 on 01/01/2020 for migraine, lumbar TPI as well. SPG block 03/13/20. Bilateral GONB 04/01/20  Recent Visits Date Type Provider Dept  07/29/20 Procedure visit Grant Santa, MD [facility]  Showing recent visits within past 90 days and meeting all other requirements Today's Visits Date Type Provider Dept  09/17/20 Office Visit Grant Santa, MD [facility]  Showing today's visits and meeting all other requirements Future Appointments No visits were found meeting these conditions. Showing future appointments within next 90 days and meeting all other requirements  I discussed the assessment and  treatment plan with the patient. The patient was provided an opportunity to ask questions and all were answered. The patient agreed with the plan and demonstrated an understanding of the instructions.  Patient advised to call back or seek an in-person evaluation if the symptoms or condition worsens.  Duration of encounter: 30 minutes.  Note by: Grant Santa, MD Date: 09/17/2020; Time: 11:43 AM

## 2020-09-18 LAB — ANTI-MICROSOMAL ANTIBODY LIVER / KIDNEY: LKM1 Ab: 1 Units (ref 0.0–20.0)

## 2020-09-18 LAB — GAMMA GT: GGT: 24 IU/L (ref 0–65)

## 2020-09-18 LAB — HEPATITIS B E ANTIGEN: Hep B E Ag: NEGATIVE

## 2020-09-18 LAB — CK: Total CK: 71 U/L (ref 41–331)

## 2020-09-18 LAB — CERULOPLASMIN: Ceruloplasmin: 26.6 mg/dL (ref 16.0–31.0)

## 2020-09-18 LAB — IMMUNOGLOBULINS A/E/G/M, SERUM
IgA/Immunoglobulin A, Serum: 313 mg/dL (ref 61–437)
IgE (Immunoglobulin E), Serum: 37 IU/mL (ref 6–495)
IgG (Immunoglobin G), Serum: 830 mg/dL (ref 603–1613)
IgM (Immunoglobulin M), Srm: 42 mg/dL (ref 15–143)

## 2020-09-18 LAB — MITOCHONDRIAL/SMOOTH MUSCLE AB PNL
Mitochondrial Ab: <20 Units (ref 0.0–20.0)
Smooth Muscle Ab: 8 Units (ref 0–19)

## 2020-09-18 LAB — ALKALINE PHOSPHATASE, ISOENZYMES
Alkaline Phosphatase: 149 IU/L — ABNORMAL HIGH (ref 44–121)
BONE FRACTION: 19 % (ref 12–68)
INTESTINAL FRAC.: 0 % (ref 0–18)
LIVER FRACTION: 81 % (ref 13–88)

## 2020-09-18 LAB — IRON,TIBC AND FERRITIN PANEL
Ferritin: 58 ng/mL (ref 30–400)
Iron Saturation: 39 % (ref 15–55)
Iron: 98 ug/dL (ref 38–169)
Total Iron Binding Capacity: 250 ug/dL (ref 250–450)
UIBC: 152 ug/dL (ref 111–343)

## 2020-09-18 LAB — ANA: Anti Nuclear Antibody (ANA): NEGATIVE

## 2020-09-18 LAB — CELIAC DISEASE AB SCREEN W/RFX
Antigliadin Abs, IgA: 6 units (ref 0–19)
Transglutaminase IgA: <2 U/mL (ref 0–3)

## 2020-09-18 LAB — HEPATITIS A ANTIBODY, TOTAL: hep A Total Ab: NEGATIVE

## 2020-09-18 LAB — HEPATITIS C ANTIBODY: Hep C Virus Ab: <0.1 s/co ratio (ref 0.0–0.9)

## 2020-09-18 LAB — HEPATITIS B CORE ANTIBODY, TOTAL: Hep B Core Total Ab: NEGATIVE

## 2020-09-18 LAB — HEPATITIS B E ANTIBODY: Hep B E Ab: NEGATIVE

## 2020-09-18 LAB — HEPATITIS B SURFACE ANTIGEN: Hepatitis B Surface Ag: NEGATIVE

## 2020-09-18 LAB — ALPHA-1-ANTITRYPSIN: A-1 Antitrypsin: 149 mg/dL (ref 101–187)

## 2020-09-18 LAB — HEPATITIS B SURFACE ANTIBODY,QUALITATIVE: Hep B Surface Ab, Qual: NONREACTIVE

## 2020-09-18 NOTE — Progress Notes (Signed)
Appt put into Epic in error

## 2020-09-23 ENCOUNTER — Ambulatory Visit: Payer: Medicare Other | Admitting: Student in an Organized Health Care Education/Training Program

## 2020-09-25 LAB — COMPLIANCE DRUG ANALYSIS, UR

## 2020-10-01 ENCOUNTER — Other Ambulatory Visit: Payer: Self-pay

## 2020-10-01 ENCOUNTER — Other Ambulatory Visit
Admission: RE | Admit: 2020-10-01 | Discharge: 2020-10-01 | Disposition: A | Discharge status: Home / Self Care | Payer: Medicare Other | Source: Ambulatory Visit | Attending: Gastroenterology | Admitting: Gastroenterology

## 2020-10-01 DIAGNOSIS — Z01812 Encounter for preprocedural laboratory examination: Secondary | ICD-10-CM | POA: Insufficient documentation

## 2020-10-01 DIAGNOSIS — Z20822 Contact with and (suspected) exposure to covid-19: Secondary | ICD-10-CM | POA: Insufficient documentation

## 2020-10-01 LAB — SARS CORONAVIRUS 2 (TAT 6-24 HRS): SARS Coronavirus 2: NEGATIVE

## 2020-10-02 ENCOUNTER — Telehealth: Payer: Self-pay

## 2020-10-02 NOTE — Telephone Encounter (Signed)
Patient wife verbalized understanding of lab work. Asked Dr. Vicente Males if he still wanted to see patient on 10/16/2020 he says yes he will discuss the lab work further then

## 2020-10-02 NOTE — Telephone Encounter (Signed)
-----   Message from [name], MD sent at 09/30/2020 11:15 AM EST ----- Grant Young   Inform patient that his entire autoimmune and viral hepatitis work-up was negative.  GGT is also negative which indicates the elevated alkaline phosphatase is not related to the liver.  He could benefit from hepatitis A and B vaccination as he is not immunized.  Very often at his age and elevated alkaline phosphatase may be due to Paget's disease of the bone.  No further GI evaluation is required as his GGT has been negative on multiple occasions.  C/c [name], NP    Dr [name] MD,MRCP [facility]) Gastroenterology/Hepatology Pager: [phone]

## 2020-10-03 ENCOUNTER — Ambulatory Visit: Payer: Medicare Other | Admitting: Certified Registered Nurse Anesthetist

## 2020-10-03 ENCOUNTER — Ambulatory Visit
Admission: RE | Admit: 2020-10-03 | Discharge: 2020-10-03 | Disposition: A | Discharge status: Home / Self Care | Payer: Medicare Other | Attending: Gastroenterology | Admitting: Gastroenterology

## 2020-10-03 ENCOUNTER — Other Ambulatory Visit: Payer: Self-pay

## 2020-10-03 ENCOUNTER — Encounter: Payer: Self-pay | Admitting: Gastroenterology

## 2020-10-03 ENCOUNTER — Encounter
Admission: RE | Disposition: A | Discharge status: Home / Self Care | Payer: Self-pay | Source: Home / Self Care | Attending: Gastroenterology

## 2020-10-03 ENCOUNTER — Encounter: Payer: Self-pay | Admitting: Internal Medicine

## 2020-10-03 ENCOUNTER — Encounter: Payer: Self-pay | Admitting: Student in an Organized Health Care Education/Training Program

## 2020-10-03 DIAGNOSIS — K529 Noninfective gastroenteritis and colitis, unspecified: Secondary | ICD-10-CM | POA: Insufficient documentation

## 2020-10-03 DIAGNOSIS — Z13810 Encounter for screening for upper gastrointestinal disorder: Secondary | ICD-10-CM | POA: Diagnosis present

## 2020-10-03 DIAGNOSIS — K640 First degree hemorrhoids: Secondary | ICD-10-CM | POA: Diagnosis not present

## 2020-10-03 DIAGNOSIS — Z79899 Other long term (current) drug therapy: Secondary | ICD-10-CM | POA: Insufficient documentation

## 2020-10-03 DIAGNOSIS — K219 Gastro-esophageal reflux disease without esophagitis: Secondary | ICD-10-CM

## 2020-10-03 DIAGNOSIS — Z79890 Hormone replacement therapy (postmenopausal): Secondary | ICD-10-CM | POA: Insufficient documentation

## 2020-10-03 DIAGNOSIS — Z1211 Encounter for screening for malignant neoplasm of colon: Secondary | ICD-10-CM

## 2020-10-03 DIAGNOSIS — R197 Diarrhea, unspecified: Secondary | ICD-10-CM | POA: Diagnosis not present

## 2020-10-03 DIAGNOSIS — Z85828 Personal history of other malignant neoplasm of skin: Secondary | ICD-10-CM | POA: Diagnosis not present

## 2020-10-03 HISTORY — DX: Acute myocardial infarction, unspecified: I21.9

## 2020-10-03 HISTORY — PX: COLONOSCOPY WITH PROPOFOL: SHX5780

## 2020-10-03 HISTORY — PX: ESOPHAGOGASTRODUODENOSCOPY (EGD) WITH PROPOFOL: SHX5813

## 2020-10-03 SURGERY — COLONOSCOPY WITH PROPOFOL
Anesthesia: General

## 2020-10-03 MED ORDER — SODIUM CHLORIDE 0.9 % IV SOLN: INTRAVENOUS | Status: DC

## 2020-10-03 MED ORDER — LIDOCAINE HCL (PF) 2 % IJ SOLN
INTRAMUSCULAR | Status: AC
  Filled 2020-10-03: qty 20

## 2020-10-03 MED ORDER — EPHEDRINE SULFATE 50 MG/ML IJ SOLN
INTRAMUSCULAR | Status: DC | PRN
  Administered 2020-10-03 (×2): 10 mg via INTRAVENOUS
  Administered 2020-10-03 (×3): 5 mg via INTRAVENOUS

## 2020-10-03 MED ORDER — PROPOFOL 10 MG/ML IV BOLUS
INTRAVENOUS | Status: DC | PRN
  Administered 2020-10-03: 50 mg via INTRAVENOUS

## 2020-10-03 MED ORDER — PROPOFOL 500 MG/50ML IV EMUL
INTRAVENOUS | Status: DC | PRN
  Administered 2020-10-03: 180 ug/kg/min via INTRAVENOUS

## 2020-10-03 MED ORDER — LIDOCAINE HCL (CARDIAC) PF 100 MG/5ML IV SOSY
PREFILLED_SYRINGE | INTRAVENOUS | Status: DC | PRN
  Administered 2020-10-03: 100 mg via INTRATRACHEAL

## 2020-10-03 MED ORDER — GLYCOPYRROLATE 0.2 MG/ML IJ SOLN
INTRAMUSCULAR | Status: AC
  Filled 2020-10-03: qty 1

## 2020-10-03 MED ORDER — PROPOFOL 500 MG/50ML IV EMUL
INTRAVENOUS | Status: AC
  Filled 2020-10-03: qty 200

## 2020-10-03 MED ORDER — EPHEDRINE 5 MG/ML INJ
INTRAVENOUS | Status: AC
  Filled 2020-10-03: qty 10

## 2020-10-03 NOTE — Anesthesia Preprocedure Evaluation (Signed)
Anesthesia Evaluation  Patient identified by MRN, date of birth, ID band Patient awake    Reviewed: Allergy & Precautions, H&P , NPO status , Patient's Chart, lab work & pertinent test results  Airway Mallampati: II  TM Distance: >3 FB Neck ROM: Full    Dental  (+) Poor Dentition   Pulmonary neg pulmonary ROS,    Pulmonary exam normal        Cardiovascular hypertension, Pt. on medications and Pt. on home beta blockers + Past MI  Normal cardiovascular exam     Neuro/Psych  Headaches, PSYCHIATRIC DISORDERS Anxiety Depression  Neuromuscular disease    GI/Hepatic Neg liver ROS, GERD  Medicated,  Endo/Other  negative endocrine ROS  Renal/GU Renal disease  negative genitourinary   Musculoskeletal  (+) Arthritis , Osteoarthritis,    Abdominal   Peds negative pediatric ROS (+)  Hematology negative hematology ROS (+)   Anesthesia Other Findings              Abdominal Pain/Diarrhea              GERD              Anxiety    .Chronic back pain            .Chronic heart disease   .Depression              .Headache              .History of CT scan of brain 09/14/2018  .History of depression   .History of gastritis   .History of headache              .History of kidney stones   .History of neuropathy   .Hypertension   .Insomnia              .Kidney stone  . Lumbar radicular pain   Per New Patient Packet . Lyme disease   Per New Patient Packet . Skin cancer, basal cell  . Sleep trouble   Per New Patient Packet . Squamous cell skin cancer  . Substance abuse ([place])     Reproductive/Obstetrics negative OB ROS                             Anesthesia Physical Anesthesia Plan  ASA: III  Anesthesia Plan: General   Post-op Pain Management:    Induction: Intravenous  PONV Risk Score and Plan: 2  Airway Management Planned: Nasal Cannula  Additional  Equipment:   Intra-op Plan:   Post-operative Plan:   Informed Consent: I have reviewed the patients History and Physical, chart, labs and discussed the procedure including the risks, benefits and alternatives for the proposed anesthesia with the patient or authorized representative who has indicated his/her understanding and acceptance.     Dental advisory given  Plan Discussed with: CRNA, Anesthesiologist and Surgeon  Anesthesia Plan Comments:         Anesthesia Quick Evaluation

## 2020-10-03 NOTE — Anesthesia Procedure Notes (Signed)
Performed by: Zadia Uhde, CRNA Pre-anesthesia Checklist: Patient identified, Emergency Drugs available, Suction available, Patient being monitored and Timeout performed Patient Re-evaluated:Patient Re-evaluated prior to induction Oxygen Delivery Method: Nasal cannula Induction Type: IV induction Placement Confirmation: CO2 detector and positive ETCO2       

## 2020-10-03 NOTE — H&P (Signed)
[name], MD [address], [place], [place], [place], [address] [address], [place], [place], [place], [address] Phone: [phone]  Fax: [phone]  Primary Care Physician:  [name], NP   Pre-Procedure History & Physical: HPI:  KHANI EBEL is a 75 y.o. male is here for an endoscopy and colonoscopy    Past Medical History:  Diagnosis Date  . Anxiety    Per New Patient Packet  . Chronic back pain    Per New Patient Packet  . Chronic heart disease    Per New Patient Packet  . Depression   . Headache   . History of CT scan of brain 09/14/2018   Per New Patient Packet  . History of depression    Per New Patient Packet  . History of gastritis    Per New Patient Packet  . History of headache    Per New Patient Packet  . History of kidney stones    Per New Patient Packet  . History of neuropathy    Per New Patient Packet  . Hypertension    Per New Patient Packet  . Insomnia   . Kidney stone   . Lumbar radicular pain    Per New Patient Packet  . Lyme disease    Per New Patient Packet  . Myocardial infarction ([place])   . Skin cancer, basal cell   . Sleep trouble    Per New Patient Packet  . Squamous cell skin cancer   . Substance abuse [facility])     Past Surgical History:  Procedure Laterality Date  . BACK SURGERY    . CARDIAC CATHETERIZATION    . CHOLECYSTECTOMY  2017  . COLONOSCOPY  09/15/2015   Per New Patient Packet  . FOOT SURGERY Bilateral 03/18/2020  . FOOT SURGERY  08/032021  . GALLBLADDER SURGERY  09/15/2015   Gallbladder Removal. Procedure done by Dr.Beverly. Per New Patient Packet  . KIDNEY STONE SURGERY  09/14/1980   Too many to count. Per New Patient Packet 09/14/1980-09/15/1995  . KIDNEY STONE SURGERY  09/15/1995   Too many to count. Per New Patient Packet  . LITHOTRIPSY  09/14/2014   Per New Patient Packet  . LITHOTRIPSY  09/14/1996   No Stints Used. Per New Patient Packet  . LITHOTRIPSY  09/14/1997   No Stints  used. Per New Patient Packet  . PAIN PUMP IMPLANTATION    . PAIN PUMP REMOVAL    . SHOULDER SURGERY Right 1984  . SIGMOIDOSCOPY  09/14/2017   Per New Patient Packet  . SPINAL CORD STIMULATOR IMPLANT    . TONSILLECTOMY    . UPPER GI ENDOSCOPY      Prior to Admission medications   Medication Sig Start Date End Date Taking? Authorizing Provider  amitriptyline (ELAVIL) 10 MG tablet Take 20 mg by mouth at bedtime.    Yes [provider]  anastrozole (ARIMIDEX) 1 MG tablet Take 0.5 mg by mouth 2 (two) times a week.    Yes [provider]  butalbital-acetaminophen-caffeine (FIORICET) [phone] MG tablet Take by mouth. 09/13/20  Yes [provider]  butalbital-aspirin-caffeine Acquanetta Chain) 50-325-40 MG tablet Take 1 tablet by mouth as needed for headache.    Yes [provider]  chlorthalidone (HYGROTON) 25 MG tablet Take 1 tablet (25 mg total) by mouth daily. 06/04/20  Yes [name], NP  Cholecalciferol 125 MCG (5000 UT) TABS Take by mouth. 04/15/20  Yes [provider]  Cyanocobalamin (VITAMIN B-12)  5000 MCG LOZG Take 1 lozenge by mouth daily.    Yes [provider]  dicyclomine (BENTYL) 20 MG tablet Take 20 mg by mouth 4 (four) times daily. 07/20/20  Yes [provider]  Digestive Aids Mixture (DIGESTION GB PO) Take 1 capsule by mouth daily.    Yes [provider]  escitalopram (LEXAPRO) 20 MG tablet Take 1 tablet (20 mg total) by mouth daily. 06/04/20  Yes [name], NP  ezetimibe (ZETIA) 10 MG tablet Take 1 tablet (10 mg total) by mouth daily. 06/04/20  Yes [name], NP  Loperamide HCl (IMODIUM PO) Take by mouth as needed.    Yes [provider]  LORazepam (ATIVAN) 0.5 MG tablet Take 1 tablet (0.5 mg total) by mouth 2 (two) times daily. 09/03/20  Yes [name], NP  MAGNESIUM BISGLYCINATE PO Take 400 mg by mouth at bedtime.    Yes [provider]  Melatonin 10 MG TABS Take 10  mg by mouth at bedtime.    Yes [provider]  NONFORMULARY OR COMPOUNDED ITEM Bi-Mix Injection  Papaverine HCI/Phentolamine Mesylate 150mg /55mg /Vial Inject 60ml prior to intercourse   For intracavernosal use only 06/19/20  Yes Billey Co, MD  ondansetron (ZOFRAN) 4 MG tablet Take 4 mg by mouth as needed for nausea or vomiting.    Yes [provider]  potassium chloride (KLOR-CON) 10 MEQ tablet Take by mouth. 06/10/20  Yes [provider]  prochlorperazine (COMPAZINE) 10 MG tablet Take 10 mg by mouth as needed. 07/01/20  Yes [provider]  propranolol (INDERAL) 40 MG tablet Take 1 tablet (40 mg total) by mouth in the morning and at bedtime. 06/04/20  Yes [name], NP  RABEprazole (ACIPHEX) 20 MG tablet TAKE 1 TABLET BY MOUTH TWICE A DAY 07/22/20  Yes [name], MD  tizanidine (ZANAFLEX) 2 MG capsule Take 1 capsule (2 mg total) by mouth every 6 (six) hours as needed. 08/06/20  Yes [name], NP  cholestyramine Lucrezia Starch) 4 g packet  08/19/20   [provider]  GLUTATHIONE PO Take 1 tablet by mouth daily.    [provider]  HYDROcodone-acetaminophen (NORCO) 10-325 MG tablet Take 1 tablet by mouth 2 (two) times daily as needed for severe pain. Must last 30 days. 09/17/20 10/17/20  [name], MD  hydrocortisone (ANUSOL-HC) 25 MG suppository Place 1 suppository (25 mg total) rectally at bedtime as needed for hemorrhoids or anal itching. 09/16/20   [name], MD  sucralfate (CARAFATE) 1 g tablet Take 1 g by mouth 4 (four) times daily as needed.     [provider]  Testosterone Cypionate (DEPO-TESTOSTERONE IM) Inject 0.4 mLs into the muscle every 14 (fourteen) days.     [provider]  valACYclovir (VALTREX) 1000 MG tablet Take 2 tablets by mouth and repeat in 12 hours for cold sore 06/24/20   [name], NP    Allergies as of 09/16/2020 - Review Complete 09/16/2020  Allergen Reaction Noted  .  Ace inhibitors  08/24/2019  . Fluoxetine Anxiety 10/31/2019  . Metoclopramide  10/31/2019  . Nalbuphine  10/31/2019  . Other  01/03/2014  . Amoxicillin-pot clavulanate Nausea Only 10/31/2019  . Doxazosin Rash 02/01/2013  . Duloxetine Nausea Only 10/31/2019  . Penicillins Nausea Only 10/31/2019  . Tamsulosin Itching and Anxiety 02/01/2013  . Trazodone and nefazodone Itching, Anxiety, and Rash 02/13/2012  . Amlodipine  10/31/2019  . Cinoxacin  10/31/2019  . Ciprofloxacin  08/24/2019  .  Nebivolol  08/24/2019  . Olanzapine  10/31/2019  . Olmesartan  08/24/2019  . Pregabalin  10/31/2019  . Prostaglandins  05/07/2014  . Thyroid hormones  06/09/2018  . Zolpidem  10/31/2019  . Duloxetine hcl  01/03/2014  . Fluoxetine hcl  01/03/2014  . Phenytoin Anxiety 10/31/2019    Family History  Problem Relation Age of Onset  . Heart disease Father   . Heart failure Father   . Hypertension Father   . Stroke Father   . Stroke Mother   . Dementia Mother   . Kidney Stones Daughter   . Anxiety disorder Daughter   . OCD Daughter     Social History   Socioeconomic History  . Marital status: Married    Spouse name: Not on file  . Number of children: Not on file  . Years of education: Not on file  . Highest education level: Not on file  Occupational History  . Not on file  Tobacco Use  . Smoking status: Never Smoker  . Smokeless tobacco: Never Used  Vaping Use  . Vaping Use: Never used  Substance and Sexual Activity  . Alcohol use: Yes    Comment: 1 Drink a Month.  . Drug use: Not Currently  . Sexual activity: Yes    Birth control/protection: None  Other Topics Concern  . Not on file  Social History Narrative   Tobacco use, amount per day now: None   Past tobacco use, amount per day: None   How many years did you use tobacco: 0   Alcohol use (drinks per week): 0-1 Month   Diet:   Do you drink/eat things with caffeine: Occasionally ( Hot Chocolate and maybe 1/4 of 16oz Pepsi 2-3  times a week.   Marital status: Married                                  What year were you married? 1970   Do you live in a house, apartment, assisted living, condo, trailer, etc.? House   Is it one or more stories? One   How many persons live in your home? 2   Do you have pets in your home?( please list)  No   Highest Level of education completed: Masters   Current or past profession: Intensive Transport planner for [employer]   Do you exercise? Yes                                    Type and how often? [place], Recumbent Bike, 3 times a week. Try to get a 1.2 mile walk at least 3 times a week or more.     Do you have a living will? Yes   Do you have a DNR form?  No                                 If not, do you want to discuss one?   Do you have signed POA/HPOA forms? Yes                       If so, please bring to you appointment    Do you have any difficulty bathing or dressing yourself? No    Do you have difficulty  preparing food or eating? No   Do you have difficulty managing your medications? No   Do you have any difficulty managing your finances? No   Do you have any difficulty affording your medications? No         Social Determinants of Radio broadcast assistant Strain: Not on file  Food Insecurity: Not on file  Transportation Needs: Not on file  Physical Activity: Not on file  Stress: Not on file  Social Connections: Not on file  Intimate Partner Violence: Not on file    Review of Systems: See HPI, otherwise negative ROS  Physical Exam: BP (!) 140/94   Pulse 99   Temp (!) 96.6 F (35.9 C) (Temporal)   Resp 20   Ht 5\' 9"  (1.753 m)   Wt 63.5 kg   SpO2 100%   BMI 20.67 kg/m  General:   Alert,  pleasant and cooperative in NAD Head:  Normocephalic and atraumatic. Neck:  Supple; no masses or thyromegaly. Lungs:  Clear throughout to auscultation, normal respiratory effort.    Heart:  +S1, +S2, Regular rate and rhythm, No edema. Abdomen:   Soft, nontender and nondistended. Normal bowel sounds, without guarding, and without rebound.   Neurologic:  Alert and  oriented x4;  grossly normal neurologically.  Impression/Plan: GILLIE CRISCI is here for an endoscopy and colonoscopy  to be performed for  evaluation of GERD and diarrhea.     Risks, benefits, limitations, and alternatives regarding endoscopy have been reviewed with the patient.  Questions have been answered.  All parties agreeable.   [name], MD  10/03/2020, 8:02 AM

## 2020-10-03 NOTE — Transfer of Care (Signed)
Immediate Anesthesia Transfer of Care Note  Patient: Grant Young  Procedure(s) Performed: COLONOSCOPY WITH PROPOFOL (N/A ) ESOPHAGOGASTRODUODENOSCOPY (EGD) WITH PROPOFOL (N/A )  Patient Location: PACU  Anesthesia Type:General  Level of Consciousness: drowsy  Airway & Oxygen Therapy: Patient Spontanous Breathing and Patient connected to face mask oxygen  Post-op Assessment: Report given to RN and Post -op Vital signs reviewed and stable  Post vital signs: Reviewed and stable  Last Vitals:  Vitals Value Taken Time  BP 109/68 10/03/20 0836  Temp    Pulse 52 10/03/20 0837  Resp 16 10/03/20 0837  SpO2 98 % 10/03/20 0837  Vitals shown include unvalidated device data.  Last Pain:  Vitals:   10/03/20 0745  TempSrc: Temporal  PainSc: 0-No pain         Complications: No complications documented.

## 2020-10-03 NOTE — Op Note (Signed)
[facility] Gastroenterology Patient Name: Grant Young Procedure Date: 10/03/2020 7:20 AM MRN: [id number] Account #: [id number] Date of Birth: 1946-07-24 Admit Type: Outpatient Age: 75 Room: [facility] ENDO ROOM 3 Gender: Male Note Status: Finalized Procedure:             Colonoscopy Indications:           Chronic diarrhea Providers:             [name] MD, MD Referring MD:          Carlos American. Dewaine Oats (Referring MD) Medicines:             Monitored Anesthesia Care Complications:         No immediate complications. Procedure:             Pre-Anesthesia Assessment:                        - Prior to the procedure, a History and Physical was                         performed, and patient medications, allergies and                         sensitivities were reviewed. The patient's tolerance                         of previous anesthesia was reviewed.                        - The risks and benefits of the procedure and the                         sedation options and risks were discussed with the                         patient. All questions were answered and informed                         consent was obtained.                        - ASA Grade Assessment: II - A patient with mild                         systemic disease.                        After obtaining informed consent, the colonoscope was                         passed under direct vision. Throughout the procedure,                         the patient's blood pressure, pulse, and oxygen                         saturations were monitored continuously. The                         Colonoscope was introduced through the anus and  advanced to the the terminal ileum. The colonoscopy                         was performed with ease. The patient tolerated the                         procedure well. The quality of the bowel preparation                         was good. Findings:      The  terminal ileum appeared normal. Biopsies were taken with a cold       forceps for histology.      The colon (entire examined portion) appeared normal. Biopsies for       histology were taken with a cold forceps from the entire colon for       evaluation of microscopic colitis.      Non-bleeding internal hemorrhoids were found during retroflexion. The       hemorrhoids were medium-sized and Grade I (internal hemorrhoids that do       not prolapse).      The exam was otherwise without abnormality on direct and retroflexion       views. Impression:            - The examined portion of the ileum was normal.                         Biopsied.                        - The entire examined colon is normal. Biopsied.                        - Non-bleeding internal hemorrhoids.                        - The examination was otherwise normal on direct and                         retroflexion views. Recommendation:        - Discharge patient to home (with escort).                        - Resume previous diet.                        - Continue present medications.                        - Use Questran at 1 packet (4 grams) PO BID for 6                         weeks. Procedure Code(s):     --- Professional ---                        [id number], Colonoscopy, flexible; with biopsy, single or                         multiple Diagnosis Code(s):     --- Professional ---  K64.0, First degree hemorrhoids                        K52.9, Noninfective gastroenteritis and colitis,                         unspecified CPT copyright 2019 American Medical Association. All rights reserved. The codes documented in this report are preliminary and upon coder review may  be revised to meet current compliance requirements. [name], MD [name] MD, MD 10/03/2020 8:35:17 AM This report has been signed electronically. Number of Addenda: 0 Note Initiated On: 10/03/2020 7:20 AM Scope Withdrawal Time: 0  hours 14 minutes 32 seconds  Total Procedure Duration: 0 hours 18 minutes 27 seconds  Estimated Blood Loss:  Estimated blood loss: none.      [facility]

## 2020-10-03 NOTE — Op Note (Signed)
[facility] Gastroenterology Patient Name: Grant Young Procedure Date: 10/03/2020 7:22 AM MRN: [id number] Account #: [id number] Date of Birth: [birth year] Admit Type: Outpatient Age: 75 Room: [facility] ENDO ROOM 3 Gender: Male Note Status: Finalized Procedure:             Upper GI endoscopy Indications:           Screening for Barrett's esophagus Providers:             [name] MD, MD Referring MD:          Carlos American. Dewaine Oats (Referring MD) Medicines:             Monitored Anesthesia Care Complications:         No immediate complications. Procedure:             Pre-Anesthesia Assessment:                        - Prior to the procedure, a History and Physical was                         performed, and patient medications, allergies and                         sensitivities were reviewed. The patient's tolerance                         of previous anesthesia was reviewed.                        - The risks and benefits of the procedure and the                         sedation options and risks were discussed with the                         patient. All questions were answered and informed                         consent was obtained.                        - ASA Grade Assessment: II - A patient with mild                         systemic disease.                        After obtaining informed consent, the endoscope was                         passed under direct vision. Throughout the procedure,                         the patient's blood pressure, pulse, and oxygen                         saturations were monitored continuously. The Endoscope                         was introduced through the mouth,  and advanced to the                         third part of duodenum. The upper GI endoscopy was                         accomplished with ease. The patient tolerated the                         procedure well. Findings:      The esophagus was normal.      The stomach  was normal.      The examined duodenum was normal. Impression:            - Normal esophagus.                        - Normal stomach.                        - Normal examined duodenum.                        - No specimens collected. Recommendation:        - Perform a colonoscopy today. Procedure Code(s):     --- Professional ---                        [phone], Esophagogastroduodenoscopy, flexible,                         transoral; diagnostic, including collection of                         specimen(s) by brushing or washing, when performed                         (separate procedure) Diagnosis Code(s):     --- Professional ---                        Z13.810, Encounter for screening for upper                         gastrointestinal disorder CPT copyright 2019 American Medical Association. All rights reserved. The codes documented in this report are preliminary and upon coder review may  be revised to meet current compliance requirements. [name], MD [name] MD, MD 10/03/2020 8:12:32 AM This report has been signed electronically. Number of Addenda: 0 Note Initiated On: 10/03/2020 7:22 AM Estimated Blood Loss:  Estimated blood loss: none.      [facility]

## 2020-10-03 NOTE — Anesthesia Postprocedure Evaluation (Signed)
Anesthesia Post Note  Patient: [name]  Procedure(s) Performed: COLONOSCOPY WITH PROPOFOL (N/A ) ESOPHAGOGASTRODUODENOSCOPY (EGD) WITH PROPOFOL (N/A )  Patient location during evaluation: PACU Anesthesia Type: General Level of consciousness: awake and alert, awake and oriented Pain management: pain level controlled Vital Signs Assessment: post-procedure vital signs reviewed and stable Respiratory status: spontaneous breathing, nonlabored ventilation and respiratory function stable Cardiovascular status: blood pressure returned to baseline and stable Postop Assessment: no apparent nausea or vomiting Anesthetic complications: no   No complications documented.   Last Vitals:  Vitals:   10/03/20 0850 10/03/20 0900  BP: (!) 162/84 (!) 173/123  Pulse:  (!) 57  Resp:  16  Temp:    SpO2:  100%    Last Pain:  Vitals:   10/03/20 0830  TempSrc: Temporal  PainSc:                  Phill Mutter

## 2020-10-04 ENCOUNTER — Encounter: Payer: Self-pay | Admitting: Gastroenterology

## 2020-10-04 LAB — SURGICAL PATHOLOGY

## 2020-10-05 ENCOUNTER — Other Ambulatory Visit: Payer: Self-pay | Admitting: Nurse Practitioner

## 2020-10-05 DIAGNOSIS — I10 Essential (primary) hypertension: Secondary | ICD-10-CM

## 2020-10-05 DIAGNOSIS — F419 Anxiety disorder, unspecified: Secondary | ICD-10-CM

## 2020-10-07 NOTE — Telephone Encounter (Signed)
QQ'P called in due to e-scribe system down

## 2020-10-08 ENCOUNTER — Other Ambulatory Visit: Payer: Self-pay | Admitting: Nurse Practitioner

## 2020-10-08 ENCOUNTER — Encounter: Payer: Self-pay | Admitting: Gastroenterology

## 2020-10-08 DIAGNOSIS — B009 Herpesviral infection, unspecified: Secondary | ICD-10-CM

## 2020-10-14 ENCOUNTER — Ambulatory Visit (INDEPENDENT_AMBULATORY_CARE_PROVIDER_SITE_OTHER): Payer: Medicare Other | Admitting: Physician Assistant

## 2020-10-14 ENCOUNTER — Other Ambulatory Visit: Payer: Self-pay

## 2020-10-14 VITALS — BP 161/93 | HR 68 | Ht 69.0 in | Wt 140.0 lb

## 2020-10-14 DIAGNOSIS — N401 Enlarged prostate with lower urinary tract symptoms: Secondary | ICD-10-CM

## 2020-10-14 DIAGNOSIS — R3912 Poor urinary stream: Secondary | ICD-10-CM

## 2020-10-14 LAB — URINALYSIS, COMPLETE
Bilirubin, UA: NEGATIVE
Glucose, UA: NEGATIVE
Ketones, UA: NEGATIVE
Leukocytes,UA: NEGATIVE
Nitrite, UA: NEGATIVE
Protein,UA: NEGATIVE
Specific Gravity, UA: 1.015 (ref 1.005–1.030)
Urobilinogen, Ur: 0.2 mg/dL (ref 0.2–1.0)
pH, UA: 7 (ref 5.0–7.5)

## 2020-10-14 LAB — MICROSCOPIC EXAMINATION
Bacteria, UA: NONE SEEN
Epithelial Cells (non renal): NONE SEEN /hpf (ref 0–10)

## 2020-10-14 LAB — BLADDER SCAN AMB NON-IMAGING

## 2020-10-14 NOTE — Patient Instructions (Signed)
Cystoscopy Cystoscopy is a procedure that is used to help diagnose and sometimes treat conditions that affect the lower urinary tract. The lower urinary tract includes the bladder and the urethra. The urethra is the tube that drains urine from the bladder. Cystoscopy is done using a thin, tube-shaped instrument with a light and camera at the end (cystoscope). The cystoscope may be hard or flexible, depending on the goal of the procedure. The cystoscope is inserted through the urethra, into the bladder. Cystoscopy may be recommended if you have:  Urinary tract infections that keep coming back.  Blood in the urine (hematuria).  An inability to control when you urinate (urinary incontinence) or an overactive bladder.  Unusual cells found in a urine sample.  A blockage in the urethra, such as a urinary stone.  Painful urination.  An abnormality in the bladder found during an intravenous pyelogram (IVP) or CT scan. Cystoscopy may also be done to remove a sample of tissue to be examined under a microscope (biopsy). Tell a health care provider about:  Any allergies you have.  All medicines you are taking, including vitamins, herbs, eye drops, creams, and over-the-counter medicines.  Any problems you or family members have had with anesthetic medicines.  Any blood disorders you have.  Any surgeries you have had.  Any medical conditions you have.  Whether you are pregnant or may be pregnant. What are the risks? Generally, this is a safe procedure. However, problems may occur, including:  Infection.  Bleeding.  Allergic reactions to medicines.  Damage to other structures or organs. What happens before the procedure? Medicines Ask your health care provider about:  Changing or stopping your regular medicines. This is especially important if you are taking diabetes medicines or blood thinners.  Taking medicines such as aspirin and ibuprofen. These medicines can thin your blood. Do  not take these medicines unless your health care provider tells you to take them.  Taking over-the-counter medicines, vitamins, herbs, and supplements. Tests You may have an exam or testing, such as:  X-rays of the bladder, urethra, or kidneys.  CT scan of the abdomen or pelvis.  Urine tests to check for signs of infection. General instructions  Follow instructions from your health care provider about eating or drinking restrictions.  Ask your health care provider what steps will be taken to help prevent infection. These steps may include: ? Washing skin with a germ-killing soap. ? Taking antibiotic medicine.  Plan to have a responsible adult take you home from the hospital or clinic. What happens during the procedure?  You will be given one or more of the following: ? A medicine to help you relax (sedative). ? A medicine to numb the area (local anesthetic).  The area around the opening of your urethra will be cleaned.  The cystoscope will be passed through your urethra into your bladder.  Germ-free (sterile) fluid will flow through the cystoscope to fill your bladder. The fluid will stretch your bladder so that your health care provider can clearly examine your bladder walls.  Your doctor will look at the urethra and bladder. Your doctor may take a biopsy or remove stones.  The cystoscope will be removed, and your bladder will be emptied. The procedure may vary among health care providers and hospitals.   What can I expect after the procedure? After the procedure, it is common to have:  Some soreness or pain in your abdomen and urethra.  Urinary symptoms. These include: ? Mild pain or burning  when you urinate. Pain should stop within a few minutes after you urinate. This may last for up to 1 week. ? A small amount of blood in your urine for several days. ? Feeling like you need to urinate but producing only a small amount of urine. Follow these instructions at  home: Medicines  Take over-the-counter and prescription medicines only as told by your health care provider.  If you were prescribed an antibiotic medicine, take it as told by your health care provider. Do not stop taking the antibiotic even if you start to feel better. General instructions  Return to your normal activities as told by your health care provider. Ask your health care provider what activities are safe for you.  If you were given a sedative during the procedure, it can affect you for several hours. Do not drive or operate machinery until your health care provider says that it is safe.  Watch for any blood in your urine. If the amount of blood in your urine increases, call your health care provider.  Follow instructions from your health care provider about eating or drinking restrictions.  If a tissue sample was removed for testing (biopsy) during your procedure, it is up to you to get your test results. Ask your health care provider, or the department that is doing the test, when your results will be ready.  Drink enough fluid to keep your urine pale yellow.  Keep all follow-up visits. This is important. Contact a health care provider if:  You have pain that gets worse or does not get better with medicine, especially pain when you urinate.  You have trouble urinating.  You have more blood in your urine. Get help right away if:  You have blood clots in your urine.  You have abdominal pain.  You have a fever or chills.  You are unable to urinate. Summary  Cystoscopy is a procedure that is used to help diagnose and sometimes treat conditions that affect the lower urinary tract.  Cystoscopy is done using a thin, tube-shaped instrument with a light and camera at the end.  After the procedure, it is common to have some soreness or pain in your abdomen and urethra.  Watch for any blood in your urine. If the amount of blood in your urine increases, call your health  care provider.  If you were prescribed an antibiotic medicine, take it as told by your health care provider. Do not stop taking the antibiotic even if you start to feel better. This information is not intended to replace advice given to you by your health care provider. Make sure you discuss any questions you have with your health care provider. Document Revised: 04/12/2020 Document Reviewed: 04/12/2020 Elsevier Patient Education  2021 [employer].  Transrectal Ultrasound A transrectal ultrasound is a procedure that uses sound waves to create images of the prostate gland and nearby tissues. For this procedure, an ultrasound probe is placed in the rectum. The probe sends sound waves through the wall of the rectum into the prostate gland, which is located in front of the rectum. The images show the size and shape of the prostate gland and nearby structures. You may need this test if you have:  Trouble urinating.  Trouble getting your partner pregnant (infertility).  An abnormal result from a prostate screening exam. Tell a health care provider about:  Any allergies you have.  All medicines you are taking, including vitamins, herbs, eye drops, creams, and over-the-counter medicines.  Any  blood disorders you have.  Any medical conditions you have.  Any surgeries you have had. What are the risks? Generally, this is a safe procedure. However, problems may occur, including:  Discomfort during the procedure. This is rare.  Blood in your urine or sperm after the procedure. This may occur if a sample of tissue (biopsy) is taken during the procedure. What happens before the procedure?  Your health care provider may instruct you to use an enema 1-4 hours before the procedure. Follow instructions from your health care provider about how to do the enema.  Ask your health care provider about: ? Changing or stopping your regular medicines. This is especially important if you are taking  diabetes medicines or blood thinners. ? Taking medicines such as aspirin and ibuprofen. These medicines can thin your blood. Do not take these medicines unless your health care provider tells you to take them. ? Taking over-the-counter medicines, vitamins, herbs, and supplements. What happens during the procedure?  You will be asked to lie down on your left side on an exam table.  You will bend your knees toward your chest.  Gel will be put on a probe, and the probe will be gently inserted into your rectum. This may cause a feeling of fullness.  The probe will send signals to a computer that will create images. These will be displayed on a monitor that looks like a small television screen.  The technician will slightly rotate the probe throughout the procedure. While rotating the probe, he or she will view and capture images of the prostate gland and the surrounding structures from different angles.  Your health care provider may take a biopsy sample of prostate tissue during the procedure. The images captured from the ultrasound will help guide the needle used to remove a sample of tissue. The sample will be sent to a lab for testing.  The probe will be removed. The procedure may vary among health care providers and hospitals. What can I expect after the procedure?  It is up to you to get the results of your procedure. Ask your health care provider, or the department that is doing the procedure, when your results will be ready.  Keep all follow-up visits as told by your health care provider. This is important. Summary  A transrectal ultrasound is a procedure that uses sound waves to create images of the prostate gland and nearby tissues.  The images show the size and shape of the prostate gland and nearby structures.  Before the procedure, ask your health care provider about changing or stopping your regular medicines. This is especially important if you are taking diabetes medicines  or blood thinners. This information is not intended to replace advice given to you by your health care provider. Make sure you discuss any questions you have with your health care provider. Document Revised: 10/25/2019 Document Reviewed: 10/25/2019 Elsevier Patient Education  2021 [employer].

## 2020-10-14 NOTE — Progress Notes (Signed)
10/14/2020 2:19 PM   Grant Young 1946/06/23 [id number]  CC: Chief Complaint  Patient presents with  . Urinary Frequency   HPI: Grant Young is a 75 y.o. male with PMH BPH s/p greenlight PVP approximately 15 years ago with baseline weak stream, ED on Bi-Mix, and nephrolithiasis who presents today for evaluation of slow stream.  He was seen in clinic most recently by Dr. Diamantina Providence on 06/19/2020 to establish care for the above.  Today he reports a 89-month history of progressively worsening weak stream, frequency, and nocturia.  He was started on Norco twice daily less than 1 month ago, but states his urinary symptoms were worsening on their own prior to initiating this med.  He states he has taken medications for his urinary symptoms in the past that "did not work."  Per chart review, patient reports allergies to doxazosin (rash), tamsulosin (rash, anxiety), PDE 5 inhibitors (headache), and alprostadil.  He wishes to defer new pharmacotherapy today due to this.  IPSS 22/5 as below, previously 8/2.  Additionally, patient brings recent labs with him today.  PSA 0.137 in December 2021.  He states these labs were drawn by his functional medicine doctor in [place].  Notably, she is managing him for hypogonadism and he is on testosterone cypionate injections at home for management of this condition.  In-office UA today positive for trace intact blood; urine microscopy pan negative. PVR stable at 45 mL.   IPSS    Row Name 10/14/20 1400         International Prostate Symptom Score   How often have you had the sensation of not emptying your bladder? More than half the time     How often have you had to urinate less than every two hours? Almost always     How often have you found you stopped and started again several times when you urinated? Less than half the time     How often have you found it difficult to postpone urination? More than half the time     How often have you had a weak  urinary stream? Almost always     How often have you had to strain to start urination? Less than 1 in 5 times     How many times did you typically get up at night to urinate? 2 Times     Total IPSS Score 23           Quality of Life due to urinary symptoms   If you were to spend the rest of your life with your urinary condition just the way it is now how would you feel about that? Unhappy             PMH: Past Medical History:  Diagnosis Date  . Anxiety    Per New Patient Packet  . Chronic back pain    Per New Patient Packet  . Chronic heart disease    Per New Patient Packet  . Depression   . Headache   . History of CT scan of brain 09/14/2018   Per New Patient Packet  . History of depression    Per New Patient Packet  . History of gastritis    Per New Patient Packet  . History of headache    Per New Patient Packet  . History of kidney stones    Per New Patient Packet  . History of neuropathy    Per New Patient Packet  . Hypertension  Per New Patient Packet  . Insomnia   . Kidney stone   . Lumbar radicular pain    Per New Patient Packet  . Lyme disease    Per New Patient Packet  . Myocardial infarction ([place])   . Skin cancer, basal cell   . Sleep trouble    Per New Patient Packet  . Squamous cell skin cancer   . Substance abuse [facility])     Surgical History: Past Surgical History:  Procedure Laterality Date  . BACK SURGERY    . CARDIAC CATHETERIZATION    . CHOLECYSTECTOMY  2017  . COLONOSCOPY  09/15/2015   Per New Patient Packet  . COLONOSCOPY WITH PROPOFOL N/A 10/03/2020   Procedure: COLONOSCOPY WITH PROPOFOL;  Surgeon: [name], MD;  Location: [facility] ENDOSCOPY;  Service: Gastroenterology;  Laterality: N/A;  . ESOPHAGOGASTRODUODENOSCOPY (EGD) WITH PROPOFOL N/A 10/03/2020   Procedure: ESOPHAGOGASTRODUODENOSCOPY (EGD) WITH PROPOFOL;  Surgeon: [name], MD;  Location: [name] ENDOSCOPY;  Service: Gastroenterology;  Laterality: N/A;  . FOOT SURGERY Bilateral  03/18/2020  . FOOT SURGERY  08/032021  . GALLBLADDER SURGERY  09/15/2015   Gallbladder Removal. Procedure done by Dr.Beverly. Per New Patient Packet  . KIDNEY STONE SURGERY  09/14/1980   Too many to count. Per New Patient Packet 09/14/1980-09/15/1995  . KIDNEY STONE SURGERY  09/15/1995   Too many to count. Per New Patient Packet  . LITHOTRIPSY  09/14/2014   Per New Patient Packet  . LITHOTRIPSY  09/14/1996   No Stints Used. Per New Patient Packet  . LITHOTRIPSY  09/14/1997   No Stints used. Per New Patient Packet  . PAIN PUMP IMPLANTATION    . PAIN PUMP REMOVAL    . SHOULDER SURGERY Right 1984  . SIGMOIDOSCOPY  09/14/2017   Per New Patient Packet  . SPINAL CORD STIMULATOR IMPLANT    . TONSILLECTOMY    . UPPER GI ENDOSCOPY      Home Medications:  Allergies as of 10/14/2020      Reactions   Ace Inhibitors    Other reaction(s): Cough   Fluoxetine Anxiety   "made me fall asleep" per pt "bad headaches and "makes Me Crazy" historical allergy noted in [employer] "made me fall asleep" per pt "bad headaches and "makes Me Crazy" Per New Patient Packet.    Metoclopramide    Other reaction(s): Other (See Comments), Other (See Comments), Unknown Tardive Dyskinesia  historical allergy noted in [employer] Tardive Dyskinesia  Per New Patient Packet.   Nalbuphine    Used Post Back surgery- Anesthesiologist Error. Patient had Narcotic Withdraw. Per New Patient Packet.    Other    Other reaction(s): Other (See Comments) Altered mental status in combo with narcotics at previous hospitalization - Full Withdrawal Symptoms Other reaction(s): Rash   Amoxicillin-pot Clavulanate Nausea Only   Per New Patient Packet.   Doxazosin Rash   Other reaction(s): Other - See Comments, Rash UNKNOWN REACTION UNKNOWN REACTION   Duloxetine Nausea Only   Per New Patient Packet.    Penicillins Nausea Only   Per New Patient Packet.   Tamsulosin Itching, Anxiety   Restless, Flushing, Heavy Chest,  Itching, Hyperactive mood and Anxiety. Unable to handle side effects. Per New Patient Packet.    Trazodone And Nefazodone Itching, Anxiety, Rash   Headache. "INCREASED MY ANXIETY AND HEARTRATE" Flushing, tachycardia "INCREASED MY ANXIETY AND HEARTRATE" Per New Patient Packet.   Amlodipine    Shaking, unsure of reaction. Per New Patient Packet.    Cinoxacin    GI  Intolerance, and Dizziness. Per New Patient Packet.    Ciprofloxacin    Other reaction(s): Unknown   Nebivolol    Other reaction(s): Unknown   Olanzapine    Headache and unable to sleep for 3 nights. Per New Patient Packet.   Olmesartan    Other reaction(s): Unknown   Pregabalin    Confusion, Lack of concentration, dizziness, and likely drowsiness. Per New Patient Packet.    Prostaglandins    Other reaction(s): Other (See Comments) Intolerance   Thyroid Hormones    Other reaction(s): Other (See Comments) Thyroid (Nature Thyroid) contraindicated with some of your other medications.   Zolpidem    Nightmares, Ineffective after 2 days. Per New Patient Packet.    Duloxetine Hcl    Other reaction(s): Rash   Fluoxetine Hcl    Other reaction(s): Rash   Phenytoin Anxiety   Hyperactivity, and Ineffective. Per New Patient Packet.       Medication List       Accurate as of October 14, 2020  2:19 PM. If you have any questions, ask your nurse or doctor.        amitriptyline 10 MG tablet Commonly known as: ELAVIL Take 20 mg by mouth at bedtime.   anastrozole 1 MG tablet Commonly known as: ARIMIDEX Take 0.5 mg by mouth 2 (two) times a week.   butalbital-acetaminophen-caffeine 50-325-40 MG tablet Commonly known as: FIORICET Take by mouth.   butalbital-aspirin-caffeine 50-325-40 MG tablet Commonly known as: FIORINAL Take 1 tablet by mouth as needed for headache.   chlorthalidone 25 MG tablet Commonly known as: HYGROTON Take 1 tablet (25 mg total) by mouth daily.   Cholecalciferol 125 MCG (5000 UT) Tabs Take by  mouth.   cholestyramine 4 g packet Commonly known as: QUESTRAN   DEPO-TESTOSTERONE IM Inject 0.4 mLs into the muscle every 14 (fourteen) days.   dicyclomine 20 MG tablet Commonly known as: BENTYL Take 20 mg by mouth 4 (four) times daily.   DIGESTION GB PO Take 1 capsule by mouth daily.   escitalopram 20 MG tablet Commonly known as: LEXAPRO Take 1 tablet (20 mg total) by mouth daily.   ezetimibe 10 MG tablet Commonly known as: ZETIA Take 1 tablet (10 mg total) by mouth daily.   GLUTATHIONE PO Take 1 tablet by mouth daily.   HYDROcodone-acetaminophen 10-325 MG tablet Commonly known as: Norco Take 1 tablet by mouth 2 (two) times daily as needed for severe pain. Must last 30 days.   hydrocortisone 25 MG suppository Commonly known as: ANUSOL-HC Place 1 suppository (25 mg total) rectally at bedtime as needed for hemorrhoids or anal itching.   IMODIUM PO Take by mouth as needed.   LORazepam 0.5 MG tablet Commonly known as: ATIVAN TAKE 1 TABLET BY MOUTH 2 TIMES DAILY.   MAGNESIUM BISGLYCINATE PO Take 400 mg by mouth at bedtime.   Melatonin 10 MG Tabs Take 10 mg by mouth at bedtime.   NONFORMULARY OR COMPOUNDED ITEM Bi-Mix Injection  Papaverine HCI/Phentolamine Mesylate 150mg /55mg /Vial Inject 61ml prior to intercourse   For intracavernosal use only   ondansetron 4 MG tablet Commonly known as: ZOFRAN Take 4 mg by mouth as needed for nausea or vomiting.   potassium chloride 10 MEQ tablet Commonly known as: KLOR-CON Take by mouth.   prochlorperazine 10 MG tablet Commonly known as: COMPAZINE Take 10 mg by mouth as needed.   propranolol 40 MG tablet Commonly known as: INDERAL TAKE 1 TABLET (40 MG TOTAL) BY MOUTH IN THE MORNING AND AT  BEDTIME.   RABEprazole 20 MG tablet Commonly known as: ACIPHEX TAKE 1 TABLET BY MOUTH TWICE A DAY   sucralfate 1 g tablet Commonly known as: CARAFATE Take 1 g by mouth 4 (four) times daily as needed.   tizanidine 2 MG  capsule Commonly known as: ZANAFLEX Take 1 capsule (2 mg total) by mouth every 6 (six) hours as needed.   valACYclovir 1000 MG tablet Commonly known as: VALTREX TAKE 2 TABLETS BY MOUTH AND REPEAT IN 12 HOURS FOR COLD SORE   Vitamin B-12 5000 MCG Lozg Take 1 lozenge by mouth daily.       Allergies:  Allergies  Allergen Reactions  . Ace Inhibitors     Other reaction(s): Cough  . Fluoxetine Anxiety    "made me fall asleep" per pt "bad headaches and "makes Me Crazy" historical allergy noted in [employer] "made me fall asleep" per pt "bad headaches and "makes Me Crazy" Per New Patient Packet.    . Metoclopramide     Other reaction(s): Other (See Comments), Other (See Comments), Unknown Tardive Dyskinesia  historical allergy noted in [employer] Tardive Dyskinesia  Per New Patient Packet.  . Nalbuphine     Used Post Back surgery- Anesthesiologist Error. Patient had Narcotic Withdraw. Per New Patient Packet.   . Other     Other reaction(s): Other (See Comments) Altered mental status in combo with narcotics at previous hospitalization - Full Withdrawal Symptoms Other reaction(s): Rash  . Amoxicillin-Pot Clavulanate Nausea Only    Per New Patient Packet.  . Doxazosin Rash    Other reaction(s): Other - See Comments, Rash UNKNOWN REACTION UNKNOWN REACTION   . Duloxetine Nausea Only    Per New Patient Packet.   Marland Kitchen Penicillins Nausea Only    Per New Patient Packet.  . Tamsulosin Itching and Anxiety    Restless, Flushing, Heavy Chest, Itching, Hyperactive mood and Anxiety. Unable to handle side effects. Per New Patient Packet.    . Trazodone And Nefazodone Itching, Anxiety and Rash    Headache. "INCREASED MY ANXIETY AND HEARTRATE" Flushing, tachycardia "INCREASED MY ANXIETY AND HEARTRATE" Per New Patient Packet.   . Amlodipine     Shaking, unsure of reaction. Per New Patient Packet.    . Cinoxacin     GI Intolerance, and Dizziness. Per New Patient Packet.   .  Ciprofloxacin     Other reaction(s): Unknown  . Nebivolol     Other reaction(s): Unknown  . Olanzapine     Headache and unable to sleep for 3 nights. Per New Patient Packet.   . Olmesartan     Other reaction(s): Unknown  . Pregabalin     Confusion, Lack of concentration, dizziness, and likely drowsiness. Per New Patient Packet.    . Prostaglandins     Other reaction(s): Other (See Comments) Intolerance  . Thyroid Hormones     Other reaction(s): Other (See Comments) Thyroid (Nature Thyroid) contraindicated with some of your other medications.  . Zolpidem     Nightmares, Ineffective after 2 days. Per New Patient Packet.   . Duloxetine Hcl     Other reaction(s): Rash  . Fluoxetine Hcl     Other reaction(s): Rash  . Phenytoin Anxiety    Hyperactivity, and Ineffective. Per New Patient Packet.     Family History: Family History  Problem Relation Age of Onset  . Heart disease Father   . Heart failure Father   . Hypertension Father   . Stroke Father   . Stroke Mother   .  Dementia Mother   . Kidney Stones Daughter   . Anxiety disorder Daughter   . OCD Daughter     Social History:   reports that he has never smoked. He has never used smokeless tobacco. He reports current alcohol use. He reports previous drug use.  Physical Exam: BP (!) 161/93   Pulse 68   Ht 5\' 9"  (1.753 m)   Wt 140 lb (63.5 kg)   BMI 20.67 kg/m   Constitutional:  Alert and oriented, no acute distress, nontoxic appearing HEENT: [place], AT Cardiovascular: No clubbing, cyanosis, or edema Respiratory: Normal respiratory effort, no increased work of breathing Skin: No rashes, bruises or suspicious lesions Neurologic: Grossly intact, no focal deficits, moving all 4 extremities Psychiatric: Normal mood and affect  Laboratory Data: Results for orders placed or performed in visit on 10/14/20  Microscopic Examination   Urine  Result Value Ref Range   WBC, UA 0-5 0 - 5 /hpf   RBC 0-2 0 - 2 /hpf   Epithelial  Cells (non renal) None seen 0 - 10 /hpf   Bacteria, UA None seen None seen/Few  Urinalysis, Complete  Result Value Ref Range   Specific Gravity, UA 1.015 1.005 - 1.030   pH, UA 7.0 5.0 - 7.5   Color, UA Yellow Yellow   Appearance Ur Clear Clear   Leukocytes,UA Negative Negative   Protein,UA Negative Negative/Trace   Glucose, UA Negative Negative   Ketones, UA Negative Negative   RBC, UA Trace (A) Negative   Bilirubin, UA Negative Negative   Urobilinogen, Ur 0.2 0.2 - 1.0 mg/dL   Nitrite, UA Negative Negative   Microscopic Examination See below:   Bladder Scan (Post Void Residual) in office  Result Value Ref Range   Scan Result 67mL    Assessment & Plan:   1. Benign prostatic hyperplasia with weak urinary stream 67-month history of worsening weak stream, frequency, and nocturia with stable PVR and benign UA today.  Will defer pharmacotherapy due to his history of multiple medication allergies and intolerances.  I recommended cystoscopy and TRUS with Dr. Diamantina Providence for further evaluation of his symptoms.  Patient is in agreement with this plan. - Bladder Scan (Post Void Residual) in office - Urinalysis, Complete   Return in about 2 weeks (around 10/28/2020) for Cysto and TRUS with Dr. Diamantina Providence.  Debroah Loop, PA-C  [facility] [address], [place], [place] [address] [phone]

## 2020-10-15 ENCOUNTER — Encounter: Payer: Self-pay | Admitting: Internal Medicine

## 2020-10-15 ENCOUNTER — Ambulatory Visit (INDEPENDENT_AMBULATORY_CARE_PROVIDER_SITE_OTHER): Payer: Medicare Other | Admitting: Internal Medicine

## 2020-10-15 DIAGNOSIS — G894 Chronic pain syndrome: Secondary | ICD-10-CM

## 2020-10-15 DIAGNOSIS — K219 Gastro-esophageal reflux disease without esophagitis: Secondary | ICD-10-CM

## 2020-10-15 DIAGNOSIS — F419 Anxiety disorder, unspecified: Secondary | ICD-10-CM

## 2020-10-15 DIAGNOSIS — F32A Depression, unspecified: Secondary | ICD-10-CM | POA: Diagnosis not present

## 2020-10-15 DIAGNOSIS — K580 Irritable bowel syndrome with diarrhea: Secondary | ICD-10-CM

## 2020-10-15 DIAGNOSIS — I10 Essential (primary) hypertension: Secondary | ICD-10-CM

## 2020-10-15 DIAGNOSIS — E7800 Pure hypercholesterolemia, unspecified: Secondary | ICD-10-CM | POA: Diagnosis not present

## 2020-10-15 DIAGNOSIS — E349 Endocrine disorder, unspecified: Secondary | ICD-10-CM

## 2020-10-15 DIAGNOSIS — G43701 Chronic migraine without aura, not intractable, with status migrainosus: Secondary | ICD-10-CM | POA: Diagnosis not present

## 2020-10-15 DIAGNOSIS — A6920 Lyme disease, unspecified: Secondary | ICD-10-CM

## 2020-10-15 DIAGNOSIS — B001 Herpesviral vesicular dermatitis: Secondary | ICD-10-CM

## 2020-10-15 NOTE — Progress Notes (Signed)
HPI  Patient presents the clinic today to establish care and for management of the conditions listed below.  He is transferring care from Sherrie Mustache, NP-C.  Anxiety and Depression: Intermittent. He is taking Escitalopram and Lorazepam as prescribed.  He reports his Lorazepam was recently weaned and he does not feel like he can wean it further at this time.  He does not see a therapist. He denies SI/HI.  Migraines: These occur about every other day, worse after fall in November 2021. He has not been able to identify triggers. Managed on Amitriptyline, Propranolol and Fioricet as needed for breakthrough. He does follow with [facility].  HTN: His BP today is 146/74. He is taking Chlorthalidone, Potassium and Propranolol as prescribed. ECG from 12/2019 reviewed.  HLD with CAD s/p MI: His last LDL was 100, 05/2020. Managed on Ezetimibe. He tries to consume a low fat diet.  Chronic Back Pain/Neuropathy: s/p multiple back surgeries and spinal cord stimulator. Managed with Hydrocodone, Zanaflex. He follows with pain managment.  Hx of Cold Sores: He denies recent outbreak. He takes Valacyclovir as needed.  GERD: He reports breakthrough on Rabeprazole. He takes Sulcrafate as needed when this flares. Upper GI from 09/2020 reviewed.  Hypotestosteronism: Managed with Testosterone injections every 2 weeks. He does follow with urology.  IBS: Mainly diarrhea. Managed with Imodium, Questran and Dicyclomine. He follows with GI.  History of Lyme Disease: He follows with integrative medication up in [place].  Flu: never Tetanus: 07/2020 Pneumovax: 02/2017 Prevnar: 12/2013 Shingrix: 2018/2019 Covid: Moderna x 3 PSA Screening: managed by urology Colon Screening: 09/2020 Vision Screening: annually Dentist: biannually   Past Medical History:  Diagnosis Date  . Anxiety    Per New Patient Packet  . Chronic back pain    Per New Patient Packet  . Chronic heart disease    Per New Patient Packet  .  Depression   . Headache   . History of CT scan of brain 09/14/2018   Per New Patient Packet  . History of depression    Per New Patient Packet  . History of gastritis    Per New Patient Packet  . History of headache    Per New Patient Packet  . History of kidney stones    Per New Patient Packet  . History of neuropathy    Per New Patient Packet  . Hypertension    Per New Patient Packet  . Insomnia   . Kidney stone   . Lumbar radicular pain    Per New Patient Packet  . Lyme disease    Per New Patient Packet  . Myocardial infarction ([place])   . Skin cancer, basal cell   . Sleep trouble    Per New Patient Packet  . Squamous cell skin cancer   . Substance abuse ([place])     Current Outpatient Medications  Medication Sig Dispense Refill  . amitriptyline (ELAVIL) 10 MG tablet Take 20 mg by mouth at bedtime.     Marland Kitchen anastrozole (ARIMIDEX) 1 MG tablet Take 0.5 mg by mouth 2 (two) times a week.  (Patient not taking: Reported on 10/14/2020)    . butalbital-acetaminophen-caffeine (FIORICET) 50-325-40 MG tablet Take by mouth.    . butalbital-aspirin-caffeine (FIORINAL) 50-325-40 MG tablet Take 1 tablet by mouth as needed for headache.  (Patient not taking: Reported on 10/14/2020)    . chlorthalidone (HYGROTON) 25 MG tablet Take 1 tablet (25 mg total) by mouth daily. 90 tablet 1  . Cholecalciferol 125 MCG (5000 UT)  TABS Take by mouth.    . cholestyramine (QUESTRAN) 4 g packet     . Cyanocobalamin (VITAMIN B-12) 5000 MCG LOZG Take 1 lozenge by mouth daily.     Marland Kitchen dicyclomine (BENTYL) 20 MG tablet Take 20 mg by mouth 4 (four) times daily.    . Digestive Aids Mixture (DIGESTION GB PO) Take 1 capsule by mouth daily.     Marland Kitchen escitalopram (LEXAPRO) 20 MG tablet Take 1 tablet (20 mg total) by mouth daily. 90 tablet 1  . ezetimibe (ZETIA) 10 MG tablet Take 1 tablet (10 mg total) by mouth daily. 90 tablet 1  . GLUTATHIONE PO Take 1 tablet by mouth daily.    Marland Kitchen HYDROcodone-acetaminophen (NORCO) 10-325 MG  tablet Take 1 tablet by mouth 2 (two) times daily as needed for severe pain. Must last 30 days. 60 tablet 0  . hydrocortisone (ANUSOL-HC) 25 MG suppository Place 1 suppository (25 mg total) rectally at bedtime as needed for hemorrhoids or anal itching. 5 suppository 0  . Loperamide HCl (IMODIUM PO) Take by mouth as needed.     Marland Kitchen LORazepam (ATIVAN) 0.5 MG tablet TAKE 1 TABLET BY MOUTH 2 TIMES DAILY. 60 tablet 0  . MAGNESIUM BISGLYCINATE PO Take 400 mg by mouth at bedtime.  (Patient not taking: Reported on 10/14/2020)    . Melatonin 10 MG TABS Take 10 mg by mouth at bedtime.     . NONFORMULARY OR COMPOUNDED ITEM Bi-Mix Injection  Papaverine HCI/Phentolamine Mesylate 150mg /55mg /Vial Inject 70ml prior to intercourse   For intracavernosal use only 5 each 11  . ondansetron (ZOFRAN) 4 MG tablet Take 4 mg by mouth as needed for nausea or vomiting.     . potassium chloride (KLOR-CON) 10 MEQ tablet Take by mouth.    . prochlorperazine (COMPAZINE) 10 MG tablet Take 10 mg by mouth as needed.    . propranolol (INDERAL) 40 MG tablet TAKE 1 TABLET (40 MG TOTAL) BY MOUTH IN THE MORNING AND AT BEDTIME. 180 tablet 1  . RABEprazole (ACIPHEX) 20 MG tablet TAKE 1 TABLET BY MOUTH TWICE A DAY 180 tablet 1  . sucralfate (CARAFATE) 1 g tablet Take 1 g by mouth 4 (four) times daily as needed.     . Testosterone Cypionate (DEPO-TESTOSTERONE IM) Inject 0.4 mLs into the muscle every 14 (fourteen) days.     . tizanidine (ZANAFLEX) 2 MG capsule Take 1 capsule (2 mg total) by mouth every 6 (six) hours as needed. 120 capsule 1  . valACYclovir (VALTREX) 1000 MG tablet TAKE 2 TABLETS BY MOUTH AND REPEAT IN 12 HOURS FOR COLD SORE 4 tablet 1   No current facility-administered medications for this visit.    Allergies  Allergen Reactions  . Ace Inhibitors     Other reaction(s): Cough  . Fluoxetine Anxiety    "made me fall asleep" per pt "bad headaches and "makes Me Crazy" historical allergy noted in [employer] "made me  fall asleep" per pt "bad headaches and "makes Me Crazy" Per New Patient Packet.    . Metoclopramide     Other reaction(s): Other (See Comments), Other (See Comments), Unknown Tardive Dyskinesia  historical allergy noted in [employer] Tardive Dyskinesia  Per New Patient Packet.  . Nalbuphine     Used Post Back surgery- Anesthesiologist Error. Patient had Narcotic Withdraw. Per New Patient Packet.   . Other     Other reaction(s): Other (See Comments) Altered mental status in combo with narcotics at previous hospitalization - Full Withdrawal Symptoms Other reaction(s): Rash  .  Amoxicillin-Pot Clavulanate Nausea Only    Per New Patient Packet.  . Doxazosin Rash    Other reaction(s): Other - See Comments, Rash UNKNOWN REACTION UNKNOWN REACTION   . Duloxetine Nausea Only    Per New Patient Packet.   Marland Kitchen Penicillins Nausea Only    Per New Patient Packet.  . Tamsulosin Itching and Anxiety    Restless, Flushing, Heavy Chest, Itching, Hyperactive mood and Anxiety. Unable to handle side effects. Per New Patient Packet.    . Trazodone And Nefazodone Itching, Anxiety and Rash    Headache. "INCREASED MY ANXIETY AND HEARTRATE" Flushing, tachycardia "INCREASED MY ANXIETY AND HEARTRATE" Per New Patient Packet.   . Amlodipine     Shaking, unsure of reaction. Per New Patient Packet.    . Cinoxacin     GI Intolerance, and Dizziness. Per New Patient Packet.   . Ciprofloxacin     Other reaction(s): Unknown  . Nebivolol     Other reaction(s): Unknown  . Olanzapine     Headache and unable to sleep for 3 nights. Per New Patient Packet.   . Olmesartan     Other reaction(s): Unknown  . Pregabalin     Confusion, Lack of concentration, dizziness, and likely drowsiness. Per New Patient Packet.    . Prostaglandins     Other reaction(s): Other (See Comments) Intolerance  . Thyroid Hormones     Other reaction(s): Other (See Comments) Thyroid (Nature Thyroid) contraindicated with some of your  other medications.  . Zolpidem     Nightmares, Ineffective after 2 days. Per New Patient Packet.   . Duloxetine Hcl     Other reaction(s): Rash  . Fluoxetine Hcl     Other reaction(s): Rash  . Phenytoin Anxiety    Hyperactivity, and Ineffective. Per New Patient Packet.     Family History  Problem Relation Age of Onset  . Heart disease Father   . Heart failure Father   . Hypertension Father   . Stroke Father   . Stroke Mother   . Dementia Mother   . Kidney Stones Daughter   . Anxiety disorder Daughter   . OCD Daughter     Social History   Socioeconomic History  . Marital status: Married    Spouse name: Not on file  . Number of children: Not on file  . Years of education: Not on file  . Highest education level: Not on file  Occupational History  . Not on file  Tobacco Use  . Smoking status: Never Smoker  . Smokeless tobacco: Never Used  Vaping Use  . Vaping Use: Never used  Substance and Sexual Activity  . Alcohol use: Yes    Comment: 1 Drink a Month.  . Drug use: Not Currently  . Sexual activity: Yes    Birth control/protection: None  Other Topics Concern  . Not on file  Social History Narrative   Tobacco use, amount per day now: None   Past tobacco use, amount per day: None   How many years did you use tobacco: 0   Alcohol use (drinks per week): 0-1 Month   Diet:   Do you drink/eat things with caffeine: Occasionally ( Hot Chocolate and maybe 1/4 of 16oz Pepsi 2-3 times a week.   Marital status: Married                                  What year were you married?  1970   Do you live in a house, apartment, assisted living, condo, trailer, etc.? House   Is it one or more stories? One   How many persons live in your home? 2   Do you have pets in your home?( please list)  No   Highest Level of education completed: Masters   Current or past profession: Intensive Transport planner for [employer]   Do you exercise? Yes                                     Type and how often? [place], Recumbent Bike, 3 times a week. Try to get a 1.2 mile walk at least 3 times a week or more.     Do you have a living will? Yes   Do you have a DNR form?  No                                 If not, do you want to discuss one?   Do you have signed POA/HPOA forms? Yes                       If so, please bring to you appointment    Do you have any difficulty bathing or dressing yourself? No    Do you have difficulty preparing food or eating? No   Do you have difficulty managing your medications? No   Do you have any difficulty managing your finances? No   Do you have any difficulty affording your medications? No         Social Determinants of Radio broadcast assistant Strain: Not on file  Food Insecurity: Not on file  Transportation Needs: Not on file  Physical Activity: Not on file  Stress: Not on file  Social Connections: Not on file  Intimate Partner Violence: Not on file    ROS:  Constitutional: Patient reports headaches.  Denies fever, malaise, fatigue, or abrupt weight changes.  HEENT: Denies eye pain, eye redness, ear pain, ringing in the ears, wax buildup, runny nose, nasal congestion, bloody nose, or sore throat. Respiratory: Denies difficulty breathing, shortness of breath, cough or sputum production.   Cardiovascular: Denies chest pain, chest tightness, palpitations or swelling in the hands or feet.  Gastrointestinal: Patient reports intermittent reflux and diarrhea.  Denies abdominal pain, bloating, constipation, or blood in the stool.  GU: Denies frequency, urgency, pain with urination, blood in urine, odor or discharge. Musculoskeletal: Patient reports chronic back pain.  Denies decrease in range of motion, difficulty with gait, or joint swelling.  Skin: Denies redness, rashes, lesions or ulcercations.  Neurological: Denies dizziness, difficulty with memory, difficulty with speech or problems with balance and coordination.  Psych:  Patient has a history of anxiety and depression.  Denies SI/HI.  No other specific complaints in a complete review of systems (except as listed in HPI above).  PE:  BP (!) 146/74   Pulse 63   Temp 98.5 F (36.9 C) (Temporal)   Wt 142 lb (64.4 kg)   SpO2 97%   BMI 20.97 kg/m  Wt Readings from Last 3 Encounters:  10/14/20 140 lb (63.5 kg)  10/03/20 140 lb (63.5 kg)  09/17/20 140 lb (63.5 kg)    General: Appears his stated age, well developed, well nourished in NAD.  Skin: Dry and intact HEENT: Head: normal shape and size; Eyes: sclera white, EOMs intact;  Cardiovascular: Normal rate. Pulmonary/Chest: Normal effort. Musculoskeletal:  No difficulty with gait.  Neurological: Alert and oriented.  Psychiatric: Mood and affect normal. Behavior is normal. Judgment and thought content normal.     BMET    Component Value Date/Time   NA 145 05/20/2020 0000   K 3.2 (A) 05/20/2020 0000   CL 102 05/20/2020 0000   CO2 34 (A) 05/20/2020 0000   BUN 1 (A) 05/20/2020 0000   CREATININE 0.90 03/06/2020 1134   CALCIUM 9.8 05/20/2020 0000   GFRNONAA 87 05/20/2020 0000   GFRAA 101 05/20/2020 0000    Lipid Panel     Component Value Date/Time   CHOL 170 05/16/2020 0000   TRIG 59 05/16/2020 0000   HDL 56 05/16/2020 0000   LDLCALC 100 05/16/2020 0000    CBC    Component Value Date/Time   WBC 6.2 05/20/2020 0000   RBC 5.21 (A) 05/20/2020 0000   HGB 15.6 05/20/2020 0000   HCT 47 05/20/2020 0000   PLT 199 05/20/2020 0000    Hgb A1C Lab Results  Component Value Date   HGBA1C 5.1 10/17/2019     Assessment and Plan:   Webb Silversmith, NP This visit occurred during the SARS-CoV-2 public health emergency.  Safety protocols were in place, including screening questions prior to the visit, additional usage of staff PPE, and extensive cleaning of exam room while observing appropriate contact time as indicated for disinfecting solutions.

## 2020-10-16 ENCOUNTER — Ambulatory Visit (INDEPENDENT_AMBULATORY_CARE_PROVIDER_SITE_OTHER): Payer: Medicare Other | Admitting: Gastroenterology

## 2020-10-16 ENCOUNTER — Encounter: Payer: Self-pay | Admitting: Gastroenterology

## 2020-10-16 ENCOUNTER — Other Ambulatory Visit: Payer: Self-pay

## 2020-10-16 VITALS — BP 162/89 | HR 60 | Ht 69.0 in | Wt 142.2 lb

## 2020-10-16 DIAGNOSIS — K219 Gastro-esophageal reflux disease without esophagitis: Secondary | ICD-10-CM | POA: Diagnosis not present

## 2020-10-16 DIAGNOSIS — R748 Abnormal levels of other serum enzymes: Secondary | ICD-10-CM

## 2020-10-16 DIAGNOSIS — K9089 Other intestinal malabsorption: Secondary | ICD-10-CM

## 2020-10-16 MED ORDER — CHOLESTYRAMINE 4 G PO PACK: 4.0000 g | PACK | Freq: Three times a day (TID) | ORAL | 3 refills | Status: DC

## 2020-10-16 NOTE — Progress Notes (Unsigned)
[name] MD, MRCP(U.K) [address]  [place], [place] [address]  Main: [phone]  Fax: [phone]   Primary Care Physician: [name], NP  Primary Gastroenterologist:  Dr. [name]   Follow-up for elevated alkaline phosphatase level  HPI: Grant Young is a 75 y.o. male    Summary of history :  He was initially referred and seen in February 2021 for GERD.   He has had his gallbladder taken out in 2017. He has been evaluated in the past by a gastroenterologist in [place] and underwent an upper endoscopy per his recollection which he recalls was normal except for a few small polyps. He has also had a colonoscopy in the past.At his initial visit felt that his symptoms are suggestive of dyspepsia.  AcipHex which he was on was giving him good control of his symptoms.  Plan was to try him on peppermint oil capsules which she was provided samples of which.  Follow-up as needed.  Office visit in January 2022  for an elevated alkaline phosphatase level which was isolated and been elevated since 3 months.  He had his gamma GT checked which was negative.  Ultrasound of the liver shows no biliary obstruction.  He complains of pain in the right lower quadrant radiating from the back.  On and off episodic.  He has a history of diarrhea.  For many years.  No change.  Has cholestyramine available with him but has not taken it. C. difficile toxin was negative.  Interval history 09/16/2020-11/03/2020  09/16/2020: Smooth muscle antibody, antimitochondrial antibody, entire autoimmune panel and viral hepatitis work-up was negative.,  GT was also negative indicating that the alkaline phosphatase is not related to liver.  Not immunized to hepatitis a and B.  10/03/2020: EGD: Normal study, colonoscopy: Internal hemorrhoids noted medium in size.  No polyps seen random colon biopsies taken which were negative for microscopic colitis.  Biopsies of the terminal ileum were also  normal.   Since last visit the diarrhea is better but continues.  He has noted an improvement after increasing the Questran to 2 packets a day.  Still has significant perianal itching which has not improved after conservative management since last visit and he would like to consider banding.     Current Outpatient Medications  Medication Sig Dispense Refill  . amitriptyline (ELAVIL) 10 MG tablet Take 20 mg by mouth at bedtime.     . butalbital-acetaminophen-caffeine (FIORICET) 50-325-40 MG tablet Take by mouth.    . chlorthalidone (HYGROTON) 25 MG tablet Take 1 tablet (25 mg total) by mouth daily. 90 tablet 1  . Cholecalciferol 125 MCG (5000 UT) TABS Take by mouth.    . cholestyramine (QUESTRAN) 4 g packet Take 1 packet (4 g total) by mouth 3 (three) times daily. 270 packet 3  . Cyanocobalamin (VITAMIN B-12) 5000 MCG LOZG Take 1 lozenge by mouth daily.     Marland Kitchen dicyclomine (BENTYL) 20 MG tablet Take 20 mg by mouth 4 (four) times daily.    . Digestive Aids Mixture (DIGESTION GB PO) Take 1 capsule by mouth daily.     Marland Kitchen escitalopram (LEXAPRO) 20 MG tablet Take 1 tablet (20 mg total) by mouth daily. 90 tablet 1  . ezetimibe (ZETIA) 10 MG tablet Take 1 tablet (10 mg total) by mouth daily. 90 tablet 1  . GLUTATHIONE PO Take 1 tablet by mouth daily.    Marland Kitchen HYDROcodone-acetaminophen (NORCO) 10-325 MG tablet Take 1 tablet by mouth 2 (  two) times daily as needed for severe pain. Must last 30 days. 60 tablet 0  . hydrocortisone (ANUSOL-HC) 25 MG suppository Place 1 suppository (25 mg total) rectally at bedtime as needed for hemorrhoids or anal itching. 5 suppository 0  . Loperamide HCl (IMODIUM PO) Take by mouth as needed.     Marland Kitchen LORazepam (ATIVAN) 0.5 MG tablet TAKE 1 TABLET BY MOUTH 2 TIMES DAILY. 60 tablet 0  . Melatonin 10 MG TABS Take 10 mg by mouth at bedtime.     . NONFORMULARY OR COMPOUNDED ITEM Bi-Mix Injection  Papaverine HCI/Phentolamine Mesylate 150mg /55mg /Vial Inject 43ml prior to  intercourse   For intracavernosal use only 5 each 11  . ondansetron (ZOFRAN) 4 MG tablet Take 4 mg by mouth as needed for nausea or vomiting.     . potassium chloride (KLOR-CON) 10 MEQ tablet Take by mouth.    . prochlorperazine (COMPAZINE) 10 MG tablet Take 10 mg by mouth as needed.    . propranolol (INDERAL) 40 MG tablet TAKE 1 TABLET (40 MG TOTAL) BY MOUTH IN THE MORNING AND AT BEDTIME. 180 tablet 1  . RABEprazole (ACIPHEX) 20 MG tablet TAKE 1 TABLET BY MOUTH TWICE A DAY 180 tablet 1  . sucralfate (CARAFATE) 1 g tablet Take 1 g by mouth 4 (four) times daily as needed.     . Testosterone Cypionate (DEPO-TESTOSTERONE IM) Inject 0.4 mLs into the muscle every 14 (fourteen) days.     . tizanidine (ZANAFLEX) 2 MG capsule Take 1 capsule (2 mg total) by mouth every 6 (six) hours as needed. 120 capsule 1  . valACYclovir (VALTREX) 1000 MG tablet TAKE 2 TABLETS BY MOUTH AND REPEAT IN 12 HOURS FOR COLD SORE 4 tablet 1   No current facility-administered medications for this visit.    Allergies as of 10/16/2020 - Review Complete 10/16/2020  Allergen Reaction Noted  . Ace inhibitors  08/24/2019  . Fluoxetine Anxiety 10/31/2019  . Metoclopramide  10/31/2019  . Nalbuphine  10/31/2019  . Other  01/03/2014  . Amoxicillin-pot clavulanate Nausea Only 10/31/2019  . Doxazosin Rash 02/01/2013  . Duloxetine Nausea Only 10/31/2019  . Penicillins Nausea Only 10/31/2019  . Tamsulosin Itching and Anxiety 02/01/2013  . Trazodone and nefazodone Itching, Anxiety, and Rash 02/13/2012  . Amlodipine  10/31/2019  . Cinoxacin  10/31/2019  . Ciprofloxacin  08/24/2019  . Nebivolol  08/24/2019  . Olanzapine  10/31/2019  . Olmesartan  08/24/2019  . Pregabalin  10/31/2019  . Prostaglandins  05/07/2014  . Thyroid hormones  06/09/2018  . Zolpidem  10/31/2019  . Duloxetine hcl  01/03/2014  . Fluoxetine hcl  01/03/2014  . Phenytoin Anxiety 10/31/2019    ROS:  General: Negative for anorexia, weight loss, fever,  chills, fatigue, weakness. ENT: Negative for hoarseness, difficulty swallowing , nasal congestion. CV: Negative for chest pain, angina, palpitations, dyspnea on exertion, peripheral edema.  Respiratory: Negative for dyspnea at rest, dyspnea on exertion, cough, sputum, wheezing.  GI: See history of present illness. GU:  Negative for dysuria, hematuria, urinary incontinence, urinary frequency, nocturnal urination.  Endo: Negative for unusual weight change.    Physical Examination:   BP (!) 162/89 (BP Location: Right Arm, Patient Position: Sitting, Cuff Size: Normal)   Pulse 60   Ht 5\' 9"  (1.753 m)   Wt 142 lb 3.2 oz (64.5 kg)   BMI 21.00 kg/m   General: Well-nourished, well-developed in no acute distress.  Eyes: No icterus. Conjunctivae pink. Mouth: Oropharyngeal mucosa moist and pink , no lesions  erythema or exudate.   Extremities: No lower extremity edema. No clubbing or deformities. Neuro: Alert and oriented x 3.  Grossly intact. Skin: Warm and dry, no jaundice.   Psych: Alert and cooperative, normal mood and affect.  PROCEDURE NOTE: The patient presents with symptomatic grade 2 hemorrhoids, unresponsive to maximal medical therapy, requesting rubber band ligation of his/her hemorrhoidal disease.  All risks, benefits and alternative forms of therapy were described and informed consent was obtained.  In the Left Lateral Decubitus position (if anoscopy is performed) anoscopic examination revealed grade 2 hemorrhoids in the all position(s).   The decision was made to band the LL internal hemorrhoid, and the [place] was used to perform band ligation without complication.  Digital anorectal examination was then performed to assure proper positioning of the band, and to adjust the banded tissue as required.  The patient was discharged home without pain or other issues.  Dietary and behavioral recommendations were given and (if necessary - prescriptions were given), along with  follow-up instructions.  The patient will return 4 weeks for follow-up and possible additional banding as required.  No complications were encountered and the patient tolerated the procedure well.    Imaging Studies: No results found.  Assessment and Plan:   JADARIAN MCKAY is a 75 y.o. y/o male  here today to see me for isolated elevation of alkaline phosphatase.  Less than 2 times upper limit of normal.  Gamma GT has been negative.  Right upper quadrant ultrasound negative.  Entire viral hepatitis and autoimmune work-up was negative.  Very likely the elevated alkaline phosphatase is not related to the liver and differential diagnosis would include Paget's disease at this age.  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  Colonoscopy showed no features of ileitis or microscopic colitis.  Rectal bleeding likely due to medium-sized internal hemorrhoids Since asymptomatic performed first round of banding of left lateral column.  I will increase the dose of Questran to 3 sachets a day i.e. 12 g daily   Dr [name]  MD,MRCP [facility]) Follow up in 4 weeks for further banding of internal hemorrhoids

## 2020-10-17 ENCOUNTER — Ambulatory Visit
Payer: Worker's Compensation | Attending: Student in an Organized Health Care Education/Training Program | Admitting: Student in an Organized Health Care Education/Training Program

## 2020-10-17 ENCOUNTER — Encounter: Payer: Self-pay | Admitting: Student in an Organized Health Care Education/Training Program

## 2020-10-17 DIAGNOSIS — G894 Chronic pain syndrome: Secondary | ICD-10-CM | POA: Diagnosis not present

## 2020-10-17 DIAGNOSIS — M47816 Spondylosis without myelopathy or radiculopathy, lumbar region: Secondary | ICD-10-CM

## 2020-10-17 MED ORDER — HYDROCODONE-ACETAMINOPHEN 10-325 MG PO TABS: 1.0000 | ORAL_TABLET | Freq: Two times a day (BID) | ORAL | 0 refills | Status: AC | PRN

## 2020-10-17 NOTE — Progress Notes (Signed)
Nursing Pain Medication Assessment:  Safety precautions to be maintained throughout the outpatient stay will include: orient to surroundings, keep bed in low position, maintain call bell within reach at all times, provide assistance with transfer out of bed and ambulation.  Medication Inspection Compliance: Pill count conducted under aseptic conditions, in front of the patient. Neither the pills nor the bottle was removed from the patient's sight at any time. Once count was completed pills were immediately returned to the patient in their original bottle.  Medication: Hydrocodone/APAP Pill/Patch Count: 24 of 60 pills remain Pill/Patch Appearance: Markings consistent with prescribed medication Bottle Appearance: Standard pharmacy container. Clearly labeled. Filled Date: 09/17/2020 Last Medication intake:  Today

## 2020-10-17 NOTE — Progress Notes (Signed)
PROVIDER NOTE: Information contained herein reflects review and annotations entered in association with encounter. Interpretation of such information and data should be left to medically-trained personnel. Information provided to patient can be located elsewhere in the medical record under "Patient Instructions". Document created using STT-dictation technology, any transcriptional errors that may result from process are unintentional.    Patient: Grant Young  Service Category: E/M  Provider: [name], MD  DOB: 1946-03-04  DOS: 10/17/2020  Specialty: Interventional Pain Management  MRN: [id number]  Setting: Ambulatory outpatient  PCP: [name], NP  Type: Established Patient    Referring Provider: [name], NP  Location: Office  Delivery: Face-to-face     HPI  [name], a 75 y.o. year old male, is here today because of his No primary diagnosis found.. [name] primary complain today is Back Pain (lower) Last encounter: My last encounter with him was on 09/17/2020. Pertinent problems: 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 Pain Assessment: Severity of Chronic pain is reported as a 7 /10. Location: Back Lower/denies. Onset: More than a month ago. Quality: Sharp. Timing: Constant. Modifying factor(s): ice, heat. Vitals:  height is $RemoveB'5\' 9"'CXthlVFa$  (1.753 m) and weight is 140 lb (63.5 kg). His temporal temperature is 97.2 F (36.2 C) (abnormal). His blood pressure is 153/83 (abnormal) and his pulse is 69. His respiration is 14 and oxygen saturation is 100%.   Reason for encounter: medication management.    No change in medical history since last visit.  Patient's pain is at baseline.  Patient continues multimodal pain regimen as prescribed.  States that it provides pain relief and improvement in functional  status. Continues with spinal cord stimulator  Pharmacotherapy Assessment   09/17/2020  09/17/2020   2  Hydrocodone-Acetamin 10-325 Mg  60.00  30  Bi Lat  [id number]  Nor (2541)  0/0  20.00 MME  Medicare    Analgesic: Hydrocodone 10 mg BID PRN    Monitoring: [place] PMP: PDMP reviewed during this encounter.       Pharmacotherapy: No side-effects or adverse reactions reported. Compliance: No problems identified. Effectiveness: Clinically acceptable.  Landis Martins, RN  10/17/2020 11:08 AM  Sign when Signing Visit Nursing Pain Medication Assessment:  Safety precautions to be maintained throughout the outpatient stay will include: orient to surroundings, keep bed in low position, maintain call bell within reach at all times, provide assistance with transfer out of bed and ambulation.  Medication Inspection Compliance: Pill count conducted under aseptic conditions, in front of the patient. Neither the pills nor the bottle was removed from the patient's sight at any time. Once count was completed pills were immediately returned to the patient in their original bottle.  Medication: Hydrocodone/APAP Pill/Patch Count: 24 of 60 pills remain Pill/Patch Appearance: Markings consistent with prescribed medication Bottle Appearance: Standard pharmacy container. Clearly labeled. Filled Date: 09/17/2020 Last Medication intake:  Today    UDS:  Summary  Date Value Ref Range Status  09/17/2020 Note  Final    Comment:    ==================================================================== Compliance Drug Analysis, Ur ==================================================================== Specimen Alert Note: Urinary creatinine is low; ability to detect some drugs may be compromised. Interpret results with caution. (Creatinine) ==================================================================== Test  Result       Flag       Units  Drug Present and Declared for Prescription  Verification   Lorazepam                      1107         EXPECTED   ng/mg creat    Source of lorazepam is a scheduled prescription medication.    Butalbital                     PRESENT      EXPECTED   Citalopram                     PRESENT      EXPECTED   Desmethylcitalopram            PRESENT      EXPECTED    Desmethylcitalopram is an expected metabolite of citalopram or the    enantiomeric form, escitalopram.    Acetaminophen                  PRESENT      EXPECTED   Propranolol                    PRESENT      EXPECTED  Drug Present not Declared for Prescription Verification   Oxazepam                       300          UNEXPECTED ng/mg creat   Temazepam                      357          UNEXPECTED ng/mg creat    Oxazepam and temazepam are expected metabolites of diazepam.    Oxazepam is also an expected metabolite of other benzodiazepine    drugs, including chlordiazepoxide, prazepam, clorazepate, halazepam,    and temazepam.  Oxazepam and temazepam are available as scheduled    prescription medications.  Drug Absent but Declared for Prescription Verification   Hydrocodone                    Not Detected UNEXPECTED ng/mg creat   Tizanidine                     Not Detected UNEXPECTED    Tizanidine, as indicated in the declared medication list, is not    always detected even when used as directed.    Amitriptyline                  Not Detected UNEXPECTED   Prochlorperazine               Not Detected UNEXPECTED   Salicylate                     Not Detected UNEXPECTED    Aspirin, as indicated in the declared medication list, is not always    detected even when used as directed.  ==================================================================== Test                      Result    Flag   Units      Ref Range   Creatinine              14  LL     mg/dL      >=20 ==================================================================== Declared Medications:  The flagging and  interpretation on this report are based on the  following declared medications.  Unexpected results may arise from  inaccuracies in the declared medications.   **Note: The testing scope of this panel includes these medications:   Amitriptyline (Elavil)  Butalbital (Fioricet)  Butalbital (Fiorinal)  Escitalopram (Lexapro)  Hydrocodone (Norco)  Lorazepam (Ativan)  Prochlorperazine (Compazine)  Propranolol (Inderal)   **Note: The testing scope of this panel does not include small to  moderate amounts of these reported medications:   Acetaminophen (Fioricet)  Acetaminophen (Norco)  Aspirin (Fiorinal)  Tizanidine (Zanaflex)   **Note: The testing scope of this panel does not include the  following reported medications:   Anastrozole (Arimidex)  Caffeine (Fioricet)  Caffeine (Fiorinal)  Chlorthalidone (Hygroton)  Cholecalciferol  Cholestyramine (Questran)  Dicyclomine (Bentyl)  Ezetimibe (Zetia)  Hydrocortisone  Melatonin  Ondansetron (Zofran)  Potassium (Klor-Con)  Rabeprazole (Aciphex)  Sucralfate (Carafate)  Valacyclovir (Valtrex)  Vitamin B12 ==================================================================== For clinical consultation, please call [phone]. ====================================================================      ROS  Constitutional: Denies any fever or chills Gastrointestinal: No reported hemesis, hematochezia, vomiting, or acute GI distress Musculoskeletal: +low back pain Neurological: No reported episodes of acute onset apraxia, aphasia, dysarthria, agnosia, amnesia, paralysis, loss of coordination, or loss of consciousness  Medication Review  Cholecalciferol, Digestive Aids Mixture, Glutathione, HYDROcodone-acetaminophen, LORazepam, Loperamide HCl, Melatonin, NONFORMULARY OR COMPOUNDED ITEM, RABEprazole, Testosterone Cypionate, Vitamin B-12, amitriptyline, butalbital-acetaminophen-caffeine, chlorthalidone, cholestyramine, dicyclomine,  escitalopram, ezetimibe, hydrocortisone, ondansetron, potassium chloride, prochlorperazine, propranolol, sucralfate, tizanidine, and valACYclovir  History Review  Allergy: Grant Young is allergic to ace inhibitors, fluoxetine, metoclopramide, nalbuphine, other, amoxicillin-pot clavulanate, doxazosin, duloxetine, penicillins, tamsulosin, trazodone and nefazodone, amlodipine, cinoxacin, ciprofloxacin, nebivolol, olanzapine, olmesartan, pregabalin, prostaglandins, thyroid hormones, zolpidem, duloxetine hcl, fluoxetine hcl, and phenytoin. Drug: Grant Young  reports previous drug use. Alcohol:  reports current alcohol use. Tobacco:  reports that he has never smoked. He has never used smokeless tobacco. Social: Grant Young  reports that he has never smoked. He has never used smokeless tobacco. He reports current alcohol use. He reports previous drug use. Medical:  has a past medical history of Anxiety, Chronic back pain, Chronic heart disease, Depression, Headache, History of CT scan of brain (09/14/2018), History of depression, History of gastritis, History of headache, History of kidney stones, History of neuropathy, Hypertension, Insomnia, Kidney stone, Lumbar radicular pain, Lyme disease, Myocardial infarction [facility]), Skin cancer, basal cell, Sleep trouble, Squamous cell skin cancer, and Substance abuse ([place]). Surgical: Mr. Braman  has a past surgical history that includes Kidney stone surgery (09/14/1980); Kidney stone surgery (09/15/1995); Lithotripsy (09/14/2014); Lithotripsy (09/14/1996); Lithotripsy (09/14/1997); Gallbladder surgery (09/15/2015); Sigmoidoscopy (09/14/2017); Colonoscopy (09/15/2015); Cholecystectomy (2017); Shoulder surgery (Right, 1984); Back surgery; Spinal cord stimulator implant; Pain pump implantation; Pain pump removal; Tonsillectomy; Foot surgery (Bilateral, 03/18/2020); Foot surgery (08/032021); Upper gi endoscopy; Cardiac catheterization; Colonoscopy with propofol (N/A, 10/03/2020); and  Esophagogastroduodenoscopy (egd) with propofol (N/A, 10/03/2020). Family: family history includes Anxiety disorder in his daughter; Dementia in his mother; Heart disease in his father; Heart failure in his father; Hypertension in his father; Kidney Stones in his daughter; OCD in his daughter; Stroke in his father and mother.  Laboratory Chemistry Profile   Renal Lab Results  Component Value Date   BUN 1 (A) 05/20/2020   CREATININE 0.90 03/06/2020   GFRAA 101 05/20/2020   GFRNONAA 87 05/20/2020     Hepatic Lab Results  Component Value Date  AST 18 05/20/2020   ALT 24 05/20/2020   ALBUMIN 4.2 05/20/2020   ALKPHOS 149 (H) 09/16/2020     Electrolytes Lab Results  Component Value Date   NA 145 05/20/2020   K 3.2 (A) 05/20/2020   CL 102 05/20/2020   CALCIUM 9.8 05/20/2020     Bone Lab Results  Component Value Date   TESTOSTERONE 33.2 05/20/2020     Inflammation (CRP: Acute Phase) (ESR: Chronic Phase) No results found for: CRP, ESRSEDRATE, LATICACIDVEN     Note: Above Lab results reviewed.  Recent Imaging Review  US Abdomen Limited RUQ (LIVER/GB) CLINICAL DATA:  Abnormal levels of other serum enzymes  EXAM: ULTRASOUND ABDOMEN LIMITED RIGHT UPPER QUADRANT  COMPARISON:  06/19/2020.  FINDINGS: Gallbladder:  Surgically absent.  Common bile duct:  Diameter: 6.7 mm  Liver:  No focal lesion identified. Within normal limits in parenchymal echogenicity. Portal vein is patent on color Doppler imaging with normal direction of blood flow towards the liver.  Other: None.  IMPRESSION: Normal sonographic appearance of the liver.  Sequela of cholecystectomy.  Electronically Signed   By: Primitivo Gauze M.D.   On: 08/29/2020 14:46 Note: Reviewed        Physical Exam  General appearance: Well nourished, well developed, and well hydrated. In no apparent acute distress Mental status: Alert, oriented x 3 (person, place, & time)       Respiratory: No evidence of  acute respiratory distress Eyes: PERLA Vitals: BP (!) 153/83   Pulse 69   Temp (!) 97.2 F (36.2 C) (Temporal)   Resp 14   Ht 5\' 9"  (1.753 m)   Wt 140 lb (63.5 kg)   SpO2 100%   BMI 20.67 kg/m  BMI: Estimated body mass index is 20.67 kg/m as calculated from the following:   Height as of this encounter: 5\' 9"  (1.753 m).   Weight as of this encounter: 140 lb (63.5 kg). Ideal: Ideal body weight: 70.7 kg (155 lb 13.8 oz)   Lumbar Spine Area Exam  Skin & Axial Inspection:Well healed scar from previous spine surgery detected, IPG and spinal cord stimulator in place Alignment:Symmetrical Functional IWP:[id number] ROM Stability:No instability detected Muscle Tone/Strength:Functionally intact. No obvious neuro-muscular anomalies detected. Sensory (Neurological):Musculoskeletal pain pattern Palpation:No palpable anomalies Provocative Tests: Hyperextension/rotation test:(+)bilaterally for facet joint pain.  Assessment   Status Diagnosis  Controlled Controlled Controlled 1. Lumbar facet arthropathy   2. Lumbar spondylosis   3. Chronic pain syndrome       Plan of Care   Grant Young has a current medication list which includes the following long-term medication(s): amitriptyline, chlorthalidone, cholestyramine, dicyclomine, escitalopram, ezetimibe, potassium chloride, propranolol, rabeprazole, sucralfate, and testosterone cypionate.  Pharmacotherapy (Medications Ordered): Meds ordered this encounter  Medications  . HYDROcodone-acetaminophen (NORCO) 10-325 MG tablet    Sig: Take 1 tablet by mouth 2 (two) times daily as needed for severe pain. Must last 30 days.    Dispense:  60 tablet    Refill:  0    Chronic Pain. (STOP Act - Not applicable). Fill one day early if closed on scheduled refill date.   Follow-up plan:   Return if symptoms worsen or fail to improve.     Status post Botox No. 1 on 01/01/2020 for migraine, lumbar TPI as well. SPG block  03/13/20. Bilateral GONB 04/01/20         Recent Visits Date Type Provider Dept  09/17/20 Office Visit [name], MD [facility]  07/29/20 Procedure visit Rojelio Uhrich,  Carlus Pavlov, MD [facility]  Showing recent visits within past 90 days and meeting all other requirements Today's Visits Date Type Provider Dept  10/17/20 Office Visit [name], MD [facility]  Showing today's visits and meeting all other requirements Future Appointments No visits were found meeting these conditions. Showing future appointments within next 90 days and meeting all other requirements  I discussed the assessment and treatment plan with the patient. The patient was provided an opportunity to ask questions and all were answered. The patient agreed with the plan and demonstrated an understanding of the instructions.  Patient advised to call back or seek an in-person evaluation if the symptoms or condition worsens.  Duration of encounter: 69minutes.  Note by: [name], MD Date: 10/17/2020; Time: 1:17 PM

## 2020-10-18 ENCOUNTER — Telehealth: Payer: Self-pay | Admitting: Student in an Organized Health Care Education/Training Program

## 2020-10-24 ENCOUNTER — Encounter: Payer: Self-pay | Admitting: Internal Medicine

## 2020-10-24 DIAGNOSIS — K589 Irritable bowel syndrome without diarrhea: Secondary | ICD-10-CM | POA: Insufficient documentation

## 2020-10-24 DIAGNOSIS — A6920 Lyme disease, unspecified: Secondary | ICD-10-CM | POA: Insufficient documentation

## 2020-10-24 DIAGNOSIS — B001 Herpesviral vesicular dermatitis: Secondary | ICD-10-CM | POA: Insufficient documentation

## 2020-10-24 DIAGNOSIS — E349 Endocrine disorder, unspecified: Secondary | ICD-10-CM | POA: Insufficient documentation

## 2020-10-24 DIAGNOSIS — K219 Gastro-esophageal reflux disease without esophagitis: Secondary | ICD-10-CM | POA: Insufficient documentation

## 2020-10-24 NOTE — Assessment & Plan Note (Signed)
He will continue Testosterone injections prescribed by urology We will follow

## 2020-10-24 NOTE — Assessment & Plan Note (Signed)
Reasonable control on Chlorthalidone and Propanolol Reinforced DASH diet We will monitor

## 2020-10-24 NOTE — Assessment & Plan Note (Signed)
Continue Rabeprazole and Sulcalfate We will monitor

## 2020-10-24 NOTE — Assessment & Plan Note (Signed)
Currently working with integrative health medicine in [place], will request copies

## 2020-10-24 NOTE — Assessment & Plan Note (Signed)
He will continue Amitriptyline, Propanolol and Fioricet for now He will continue to follow with neurology We will follow

## 2020-10-24 NOTE — Patient Instructions (Signed)
Lyme Disease Lyme disease is an infection that can affect many parts of the body, including the skin, joints, and nervous system. It is a bacterial infection that starts from the bite of an infected tick. Over time, the infection can worsen, and some of the symptoms are similar to the flu. If Lyme disease is not treated, it may cause joint pain, swelling, numbness, problems thinking, fatigue, muscle weakness, and other problems. What are the causes? This condition is caused by bacteria called Borrelia burgdorferi.  You can get Lyme disease by being bitten by an infected tick.  Only black-legged, or Ixodes, ticks that are infected with the bacteria can cause Lyme disease.  The tick must be attached to your skin for a certain period of time to pass along the infection. This is usually 36-48 hours.  Deer often carry infected ticks. What increases the risk? The following factors may make you more likely to develop this condition:  Living in or visiting these areas in the [place] ? [place]. ? [place]. ? [place].  Spending time in wooded or grassy areas.  Being outdoors with exposed skin.  Camping, gardening, hiking, fishing, hunting, or working outdoors.  Failing to remove a tick from your skin. What are the signs or symptoms? Symptoms of this condition may include:  Chills and fever.  Headache.  Fatigue.  General achiness.  Muscle pain.  Joint pain, often in the knees.  A round, red rash that surrounds the center of the tick bite. The center of the rash may be blood colored or have tiny blisters.  Swollen lymph glands.  Stiff neck.   How is this diagnosed? This condition is diagnosed based on:  Your symptoms and medical history.  A physical exam.  A blood test. How is this treated? The main treatment for this condition is antibiotic medicine, which is usually taken by mouth (orally).  The length of treatment depends on how soon after a  tick bite you begin taking the medicine. In some cases, treatment is necessary for several weeks.  If the infection is severe, antibiotics may need to be given through an IV that is inserted into one of your veins. Follow these instructions at home:  Take over-the-counter and prescription medicines only as told by your health care provider. Finish all antibiotic medicine, even when you start to feel better.  Ask your health care provider about taking a probiotic in between doses of your antibiotic to help avoid an upset stomach or diarrhea.  Check with your health care provider before supplementing your treatment. Many alternative therapies have not been proven and may be harmful to you.  Keep all follow-up visits as told by your health care provider. This is important. How is this prevented? You can become reinfected if you get another tick bite from an infected tick. Take these steps to help prevent an infection:  Cover your skin with light-colored clothing when you are outdoors in the spring and summer months.  Spray clothing and skin with bug spray. The spray should be 20-30% DEET. You can also treat clothing with permethrin, and let it dry before you wear it. Do not apply permethrin directly to your skin. Permethrin can also be used to treat camping gear and boots. Always read and follow the instructions that come with a bug spray or insecticide.  Avoid wooded, grassy, and shaded areas.  Remove yard litter, brush, trash, and plants that attract deer and rodents.  Check yourself for  ticks when you come indoors.  Wash clothing worn each day.  Shower after spending time outdoors.  Check your pets for ticks before they come inside.  If you find a tick attached to your skin: ? Remove it with tweezers. ? Clean your hands and the bite area with rubbing alcohol or soap and water. ? Dispose of the tick by putting it in rubbing alcohol, putting it in a sealed bag or container, or  flushing it down the toilet. ? You may choose to save the tick in a sealed container if you wish for it to be tested at a later time. Pregnant women should take special care to avoid tick bites because it is possible that the infection may be passed along to the fetus.   Contact a health care provider if:  You have symptoms after treatment.  You have removed a tick and want to bring it to your health care provider for testing. Get help right away if:  You have an irregular heartbeat.  You have chest pain.  You have nerve pain.  Your face feels numb.  You develop the following: ? A stiff neck. ? A severe headache. ? Severe nausea and vomiting. ? Sensitivity to light. Summary  Lyme disease is an infection that can affect many parts of the body, including the skin, joints, and nervous system.  This condition is caused by bacteria called Borrelia burgdorferi.  You can get Lyme disease by being bitten by an infected tick.  The main treatment for this condition is antibiotic medicine. This information is not intended to replace advice given to you by your health care provider. Make sure you discuss any questions you have with your health care provider. Document Revised: 12/23/2018 Document Reviewed: 11/17/2018 Elsevier Patient Education  [place].

## 2020-10-24 NOTE — Assessment & Plan Note (Signed)
We will check C met and lipid with annual exam Continue Ezetimibe Encourage low-fat diet

## 2020-10-24 NOTE — Assessment & Plan Note (Signed)
Continue Hydrocodone and Zanaflex per pain management We will follow

## 2020-10-24 NOTE — Assessment & Plan Note (Signed)
Continue Dicyclomine, Questran and Imodium We will monitor

## 2020-10-24 NOTE — Assessment & Plan Note (Signed)
Continue Valtrex as needed

## 2020-10-24 NOTE — Assessment & Plan Note (Signed)
Continue Escitalopram and Lorazepam We will obtain CSA and UDS yearly Support offered

## 2020-10-28 ENCOUNTER — Encounter: Payer: No Typology Code available for payment source | Admitting: Urology

## 2020-10-30 ENCOUNTER — Encounter: Payer: Self-pay | Admitting: Urology

## 2020-10-30 ENCOUNTER — Ambulatory Visit (INDEPENDENT_AMBULATORY_CARE_PROVIDER_SITE_OTHER): Payer: Medicare Other | Admitting: Urology

## 2020-10-30 ENCOUNTER — Other Ambulatory Visit: Payer: Self-pay

## 2020-10-30 VITALS — BP 173/95 | HR 59 | Ht 69.0 in | Wt 143.8 lb

## 2020-10-30 DIAGNOSIS — N401 Enlarged prostate with lower urinary tract symptoms: Secondary | ICD-10-CM

## 2020-10-30 DIAGNOSIS — R3912 Poor urinary stream: Secondary | ICD-10-CM

## 2020-10-30 DIAGNOSIS — N3281 Overactive bladder: Secondary | ICD-10-CM

## 2020-10-30 NOTE — Patient Instructions (Signed)
Overactive Bladder, Adult  Overactive bladder is a condition in which a person has a sudden and frequent need to urinate. A person might also leak urine if he or she cannot get to the bathroom fast enough (urinary incontinence). Sometimes, symptoms can interfere with work or social activities. What are the causes? Overactive bladder is associated with poor nerve signals between your bladder and your brain. Your bladder may get the signal to empty before it is full. You may also have very sensitive muscles that make your bladder squeeze too soon. This condition may also be caused by other factors, such as:  Medical conditions: ? Urinary tract infection. ? Infection of nearby tissues. ? Prostate enlargement. ? Bladder stones, inflammation, or tumors. ? Diabetes. ? Muscle or nerve weakness, especially from these conditions:  A spinal cord injury.  Stroke.  Multiple sclerosis.  Parkinson's disease.  Other causes: ? Surgery on the uterus or urethra. ? Drinking too much caffeine or alcohol. ? Certain medicines, especially those that eliminate extra fluid in the body (diuretics). ? Constipation. What increases the risk? You may be at greater risk for overactive bladder if you:  Are an older adult.  Smoke.  Are going through menopause.  Have prostate problems.  Have a neurological disease, such as stroke, dementia, Parkinson's disease, or multiple sclerosis (MS).  Eat or drink alcohol, spicy food, caffeine, and other things that irritate the bladder.  Are overweight or obese. What are the signs or symptoms? Symptoms of this condition include a sudden, strong urge to urinate. Other symptoms include:  Leaking urine.  Urinating 8 or more times a day.  Waking up to urinate 2 or more times overnight. How is this diagnosed? This condition may be diagnosed based on:  Your symptoms and medical history.  A physical exam.  Blood or urine tests to check for possible causes,  such as infection. You may also need to see a health care provider who specializes in urinary tract problems. This is called a urologist. How is this treated? Treatment for overactive bladder depends on the cause of your condition and whether it is mild or severe. Treatment may include:  Bladder training, such as: ? Learning to control the urge to urinate by following a schedule to urinate at regular intervals. ? Doing Kegel exercises to strengthen the pelvic floor muscles that support your bladder.  Special devices, such as: ? Biofeedback. This uses sensors to help you become aware of your body's signals. ? Electrical stimulation. This uses electrodes placed inside the body (implanted) or outside the body. These electrodes send gentle pulses of electricity to strengthen the nerves or muscles that control the bladder. ? Women may use a plastic device, called a pessary, that fits into the vagina and supports the bladder.  Medicines, such as: ? Antibiotics to treat bladder infection. ? Antispasmodics to stop the bladder from releasing urine at the wrong time. ? Tricyclic antidepressants to relax bladder muscles. ? Injections of botulinum toxin type A directly into the bladder tissue to relax bladder muscles.  Surgery, such as: ? A device may be implanted to help manage the nerve signals that control urination. ? An electrode may be implanted to stimulate electrical signals in the bladder. ? A procedure may be done to change the shape of the bladder. This is done only in very severe cases. Follow these instructions at home: Eating and drinking  Make diet or lifestyle changes recommended by your health care provider. These may include: ? Drinking fluids   throughout the day and not only with meals. ? Cutting down on caffeine or alcohol. ? Eating a healthy and balanced diet to prevent constipation. This may include:  Choosing foods that are high in fiber, such as beans, whole grains, and  fresh fruits and vegetables.  Limiting foods that are high in fat and processed sugars, such as fried and sweet foods.   Lifestyle  Lose weight if needed.  Do not use any products that contain nicotine or tobacco. These include cigarettes, chewing tobacco, and vaping devices, such as e-cigarettes. If you need help quitting, ask your health care provider.   General instructions  Take over-the-counter and prescription medicines only as told by your health care provider.  If you were prescribed an antibiotic medicine, take it as told by your health care provider. Do not stop taking the antibiotic even if you start to feel better.  Use any implants or pessary as told by your health care provider.  If needed, wear pads to absorb urine leakage.  Keep a log to track how much and when you drink, and when you need to urinate. This will help your health care provider monitor your condition.  Keep all follow-up visits. This is important. Contact a health care provider if:  You have a fever or chills.  Your symptoms do not get better with treatment.  Your pain and discomfort get worse.  You have more frequent urges to urinate. Get help right away if:  You are not able to control your bladder. Summary  Overactive bladder refers to a condition in which a person has a sudden and frequent need to urinate.  Several conditions may lead to an overactive bladder.  Treatment for overactive bladder depends on the cause and severity of your condition.  Making lifestyle changes, doing Kegel exercises, keeping a log, and taking medicines can help with this condition. This information is not intended to replace advice given to you by your health care provider. Make sure you discuss any questions you have with your health care provider. Document Revised: 05/20/2020 Document Reviewed: 05/20/2020 Elsevier Patient Education  2021 [employer]  

## 2020-10-30 NOTE — Progress Notes (Signed)
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 Cystoscopy was performed with a flexible cystoscope. The urethra, bladder neck and entire bladder was visualized in a standard fashion. The prostate was short and had an open channel after prior greenlight laser procedure. The ureteral orifices were visualized in their normal location and orientation.  Bladder empty and mucosa grossly normal throughout, no abnormalities on retroflexion.  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  I suspect his urinary symptoms are secondary to recent addition of diuretic chlorthalidone.  We discussed options including behavioral strategies, changing to a different blood pressure medication, or a trial of Myrbetriq daily for overactive bladder.  He is interested in a trial of Myrbetriq and we reviewed the risks and benefits at length.  RTC 4 to 6 weeks with PA for symptom check  Nickolas Madrid, MD 10/30/2020

## 2020-11-04 ENCOUNTER — Encounter: Payer: Self-pay | Admitting: Internal Medicine

## 2020-11-04 DIAGNOSIS — F419 Anxiety disorder, unspecified: Secondary | ICD-10-CM

## 2020-11-05 ENCOUNTER — Telehealth: Payer: Self-pay | Admitting: *Deleted

## 2020-11-05 NOTE — Telephone Encounter (Signed)
Patient left a message on voice mail. He states he has had some burning since cysto which was done on 10/30/2020. I tried to call the patient back and got his voice mail. I left a message for Amandeep to call us back today .

## 2020-11-05 NOTE — Telephone Encounter (Signed)
Patient called back and left a message on the voicemail but we kept getting his voicemail when we call back. Sharyn Lull

## 2020-11-07 ENCOUNTER — Telehealth: Payer: Self-pay | Admitting: *Deleted

## 2020-11-07 NOTE — Chronic Care Management (AMB) (Signed)
  Chronic Care Management   Note  11/07/2020 Name: Grant Young MRN: [id number] DOB: Oct 20, 1945  [name] is a 75 y.o. year old male who is a primary care patient of [name], NP. I reached out to [name] by phone today in response to a referral sent by Grant Young's PCP, [name], NP.  Grant Young was given information about Chronic Care Management services today including:  1. CCM service includes personalized support from designated clinical staff supervised by his physician, including individualized plan of care and coordination with other care providers 2. 24/7 contact phone numbers for assistance for urgent and routine care needs. 3. Service will only be billed when office clinical staff spend 20 minutes or more in a month to coordinate care. 4. Only one practitioner may furnish and bill the service in a calendar month. 5. The patient may stop CCM services at any time (effective at the end of the month) by phone call to the office staff. 6. The patient will be responsible for cost sharing (co-pay) of up to 20% of the service fee (after annual deductible is met).  Patient wishes to consider information provided and/or speak with a member of the care team before deciding about enrollment in care management services.   Follow up plan: Patient declines engagement by the care management team. Appropriate care team members and provider have been notified via electronic communication. The care management team is available to follow up with the patient after provider conversation with the patient regarding recommendation for care management engagement and subsequent re-referral to the care management team.   [place] [facility]

## 2020-11-11 ENCOUNTER — Ambulatory Visit (INDEPENDENT_AMBULATORY_CARE_PROVIDER_SITE_OTHER): Payer: Medicare Other | Admitting: Gastroenterology

## 2020-11-11 ENCOUNTER — Other Ambulatory Visit: Payer: Self-pay

## 2020-11-11 VITALS — BP 159/77 | HR 62 | Temp 98.0°F | Ht 69.0 in | Wt 145.0 lb

## 2020-11-11 DIAGNOSIS — K9089 Other intestinal malabsorption: Secondary | ICD-10-CM

## 2020-11-11 DIAGNOSIS — K641 Second degree hemorrhoids: Secondary | ICD-10-CM

## 2020-11-11 MED ORDER — LORAZEPAM 0.5 MG PO TABS: 0.5000 mg | ORAL_TABLET | Freq: Two times a day (BID) | ORAL | 0 refills | Status: DC

## 2020-11-11 MED ORDER — LORAZEPAM 0.5 MG PO TABS
0.5000 mg | ORAL_TABLET | Freq: Two times a day (BID) | ORAL | 0 refills | Status: DC
Start: 2020-11-11 — End: 2020-12-12

## 2020-11-11 NOTE — Telephone Encounter (Signed)
Last filled 10-07-20 #60 Last OV 10-15-20 Next OV 12-02-20 [facility]

## 2020-11-11 NOTE — Progress Notes (Signed)
Patient follow-ups today for banding of hemorrhoids    Summary of history :  Here for round 2 of hemorrhoidal banding not responding to conservative management.  Had had diarrhea likely secondary to bile salt related due to prior history of cholecystectomy.  First round:10/16/2020 LL column banded     Interval history  10/16/2020-11/11/2020  Since last visit no issues of diarrhea.  Takes Questran once a day.  No significant perianal itching.  Digital rectal exam performed in the presence of a chaperone. External anal findings: Skin tag noted Internal findings: No abnormalities, No masses, no blood on glove noticed. Anoscopy performed   PROCEDURE NOTE: The patient presents with symptomatic grade 2 hemorrhoids, unresponsive to maximal medical therapy, requesting rubber band ligation of his/her hemorrhoidal disease.  All risks, benefits and alternative forms of therapy were described and informed consent was obtained.  In the Left Lateral Decubitus position (if anoscopy is performed) anoscopic examination revealed grade 2 hemorrhoids in the RA and RP position(s).   The decision was made to band the RA internal hemorrhoid, and the [place] was used to perform band ligation without complication.  Digital anorectal examination was then performed to assure proper positioning of the band, and to adjust the banded tissue as required.  The patient was discharged home without pain or other issues.  Dietary and behavioral recommendations were given and (if necessary - prescriptions were given), along with follow-up instructions.  The patient will return 4 weeks for follow-up and possible additional banding as required.  No complications were encountered and the patient tolerated the procedure well.   Plan:  1. Avoid constipation.  Commence on stool softeners if not already on  Follow-up: 4 weeks  Dr [name] MD,MRCP [facility]) Gastroenterology/Hepatology Pager: [phone]

## 2020-11-13 ENCOUNTER — Ambulatory Visit
Payer: Worker's Compensation | Attending: Student in an Organized Health Care Education/Training Program | Admitting: Student in an Organized Health Care Education/Training Program

## 2020-11-13 ENCOUNTER — Other Ambulatory Visit: Payer: Self-pay

## 2020-11-13 ENCOUNTER — Encounter: Payer: Self-pay | Admitting: Student in an Organized Health Care Education/Training Program

## 2020-11-13 ENCOUNTER — Ambulatory Visit
Admission: RE | Admit: 2020-11-13 | Discharge: 2020-11-13 | Disposition: A | Discharge status: Home / Self Care | Payer: Medicare Other | Source: Ambulatory Visit | Attending: Student in an Organized Health Care Education/Training Program | Admitting: Student in an Organized Health Care Education/Training Program

## 2020-11-13 VITALS — BP 197/97 | HR 56 | Temp 97.3°F | Resp 13 | Ht 69.0 in | Wt 142.0 lb

## 2020-11-13 DIAGNOSIS — M47816 Spondylosis without myelopathy or radiculopathy, lumbar region: Secondary | ICD-10-CM | POA: Diagnosis present

## 2020-11-13 DIAGNOSIS — G894 Chronic pain syndrome: Secondary | ICD-10-CM | POA: Diagnosis not present

## 2020-11-13 MED ORDER — ROPIVACAINE HCL 2 MG/ML IJ SOLN
9.0000 mL | Freq: Once | INTRAMUSCULAR | Status: AC
  Administered 2020-11-13: 10 mL via PERINEURAL

## 2020-11-13 MED ORDER — DEXAMETHASONE SODIUM PHOSPHATE 10 MG/ML IJ SOLN
10.0000 mg | Freq: Once | INTRAMUSCULAR | Status: AC
  Administered 2020-11-13: 10 mg

## 2020-11-13 MED ORDER — DEXAMETHASONE SODIUM PHOSPHATE 10 MG/ML IJ SOLN
INTRAMUSCULAR | Status: AC
  Filled 2020-11-13: qty 2

## 2020-11-13 MED ORDER — LIDOCAINE HCL 2 % IJ SOLN
INTRAMUSCULAR | Status: AC
  Filled 2020-11-13: qty 20

## 2020-11-13 MED ORDER — LIDOCAINE HCL 2 % IJ SOLN
20.0000 mL | Freq: Once | INTRAMUSCULAR | Status: AC
  Administered 2020-11-13: 200 mg

## 2020-11-13 MED ORDER — ROPIVACAINE HCL 2 MG/ML IJ SOLN
INTRAMUSCULAR | Status: AC
  Filled 2020-11-13: qty 20

## 2020-11-13 NOTE — Progress Notes (Signed)
Safety precautions to be maintained throughout the outpatient stay will include: orient to surroundings, keep bed in low position, maintain call bell within reach at all times, provide assistance with transfer out of bed and ambulation.  

## 2020-11-13 NOTE — Progress Notes (Signed)
PROVIDER NOTE: Information contained herein reflects review and annotations entered in association with encounter. Interpretation of such information and data should be left to medically-trained personnel. Information provided to patient can be located elsewhere in the medical record under "Patient Instructions". Document created using STT-dictation technology, any transcriptional errors that may result from process are unintentional.    Patient: Grant Young  Service Category: Procedure  Provider: [name], MD  DOB: [birth year]  DOS: 11/13/2020  Location: [facility]  MRN: [id number]  Setting: Ambulatory - outpatient  Referring Provider: [name], NP  Type: Established Patient  Specialty: Interventional Pain Management  PCP: [name], NP   Primary Reason for Visit: Interventional Pain Management Treatment. CC: Back Pain (low)  Procedure:          Anesthesia, Analgesia, Anxiolysis:  Type: Lumbar Facet, Medial Branch Block(s) #1 (for 2022, had 2 done in 2021)  Primary Purpose: Therapeutic Region: Posterolateral Lumbosacral Spine Level: L3, L4, L5, Medial Branch Level(s). Injecting these levels blocks the L3-4, L4-5,  lumbar facet joints. Laterality: Bilateral  Type: Local Anesthesia  Local Anesthetic: Lidocaine 1-2%  Position: Prone   Indications: 1. Lumbar facet arthropathy   2. Lumbar spondylosis   3. Chronic pain syndrome    Pain Score: Pre-procedure: 6 /10 Post-procedure: 0-No pain/10   Pre-op Assessment:  Grant Young is a 75 y.o. (year old), male patient, seen today for interventional treatment. He  has a past surgical history that includes Kidney stone surgery (09/14/1980); Kidney stone surgery (09/15/1995); Lithotripsy (09/14/2014); Lithotripsy (09/14/1996); Lithotripsy (09/14/1997); Gallbladder surgery (09/15/2015); Sigmoidoscopy (09/14/2017); Colonoscopy (09/15/2015); Cholecystectomy (2017); Shoulder surgery (Right, 1984); Back surgery; Spinal cord  stimulator implant; Pain pump implantation; Pain pump removal; Tonsillectomy; Foot surgery (Bilateral, 03/18/2020); Foot surgery (08/032021); Upper gi endoscopy; Cardiac catheterization; Colonoscopy with propofol (N/A, 10/03/2020); and Esophagogastroduodenoscopy (egd) with propofol (N/A, 10/03/2020). Mr. Stollings has a current medication list which includes the following prescription(s): amitriptyline, butalbital-acetaminophen-caffeine, chlorthalidone, cholecalciferol, cholestyramine, vitamin b-12, dicyclomine, digestive aids mixture, escitalopram, ezetimibe, glutathione, hydrocodone-acetaminophen, hydrocortisone, loperamide hcl, lorazepam, melatonin, mirabegron er, NONFORMULARY OR COMPOUNDED ITEM, ondansetron, potassium chloride, prochlorperazine, propranolol, rabeprazole, sucralfate, testosterone cypionate, tizanidine, and valacyclovir. His primarily concern today is the Back Pain (low)  Initial Vital Signs:  Pulse/HCG Rate: (!) 56ECG Heart Rate: 62 Temp: (!) 97.3 F (36.3 C) Resp: 16 BP: (!) 174/81 SpO2: 98 %  BMI: Estimated body mass index is 20.97 kg/m as calculated from the following:   Height as of this encounter: 5\' 9"  (1.753 m).   Weight as of this encounter: 142 lb (64.4 kg).  Risk Assessment: Allergies: Reviewed. He is allergic to ace inhibitors, fluoxetine, metoclopramide, nalbuphine, other, amoxicillin-pot clavulanate, doxazosin, duloxetine, penicillins, tamsulosin, trazodone and nefazodone, amlodipine, cinoxacin, ciprofloxacin, nebivolol, olanzapine, olmesartan, pregabalin, prostaglandins, thyroid hormones, zolpidem, duloxetine hcl, fluoxetine hcl, and phenytoin.  Allergy Precautions: None required Coagulopathies: Reviewed. None identified.  Blood-thinner therapy: None at this time Active Infection(s): Reviewed. None identified. Mr. Carton is afebrile  Site Confirmation: Mr. Savitz was asked to confirm the procedure and laterality before marking the site Procedure checklist:  Completed Consent: Before the procedure and under the influence of no sedative(s), amnesic(s), or anxiolytics, the patient was informed of the treatment options, risks and possible complications. To fulfill our ethical and legal obligations, as recommended by the American Medical Association's Code of Ethics, I have informed the patient of my clinical impression; the nature and purpose of the treatment or procedure; the risks, benefits, and possible complications of the intervention; the alternatives,  including doing nothing; the risk(s) and benefit(s) of the alternative treatment(s) or procedure(s); and the risk(s) and benefit(s) of doing nothing. The patient was provided information about the general risks and possible complications associated with the procedure. These may include, but are not limited to: failure to achieve desired goals, infection, bleeding, organ or nerve damage, allergic reactions, paralysis, and death. In addition, the patient was informed of those risks and complications associated to Spine-related procedures, such as failure to decrease pain; infection (i.e.: Meningitis, epidural or intraspinal abscess); bleeding (i.e.: epidural hematoma, subarachnoid hemorrhage, or any other type of intraspinal or peri-dural bleeding); organ or nerve damage (i.e.: Any type of peripheral nerve, nerve root, or spinal cord injury) with subsequent damage to sensory, motor, and/or autonomic systems, resulting in permanent pain, numbness, and/or weakness of one or several areas of the body; allergic reactions; (i.e.: anaphylactic reaction); and/or death. Furthermore, the patient was informed of those risks and complications associated with the medications. These include, but are not limited to: allergic reactions (i.e.: anaphylactic or anaphylactoid reaction(s)); adrenal axis suppression; blood sugar elevation that in diabetics may result in ketoacidosis or comma; water retention that in patients with history  of congestive heart failure may result in shortness of breath, pulmonary edema, and decompensation with resultant heart failure; weight gain; swelling or edema; medication-induced neural toxicity; particulate matter embolism and blood vessel occlusion with resultant organ, and/or nervous system infarction; and/or aseptic necrosis of one or more joints. Finally, the patient was informed that Medicine is not an exact science; therefore, there is also the possibility of unforeseen or unpredictable risks and/or possible complications that may result in a catastrophic outcome. The patient indicated having understood very clearly. We have given the patient no guarantees and we have made no promises. Enough time was given to the patient to ask questions, all of which were answered to the patient's satisfaction. Mr. Urenda has indicated that he wanted to continue with the procedure. Attestation: I, the ordering provider, attest that I have discussed with the patient the benefits, risks, side-effects, alternatives, likelihood of achieving goals, and potential problems during recovery for the procedure that I have provided informed consent. Date  Time: 11/13/2020 11:16 AM  Pre-Procedure Preparation:  Monitoring: As per clinic protocol. Respiration, ETCO2, SpO2, BP, heart rate and rhythm monitor placed and checked for adequate function Safety Precautions: Patient was assessed for positional comfort and pressure points before starting the procedure. Time-out: I initiated and conducted the "Time-out" before starting the procedure, as per protocol. The patient was asked to participate by confirming the accuracy of the "Time Out" information. Verification of the correct person, site, and procedure were performed and confirmed by me, the nursing staff, and the patient. "Time-out" conducted as per Joint Commission's Universal Protocol (UP.01.01.01). Time: 1154  Description of Procedure:          Laterality: Bilateral. The  procedure was performed in identical fashion on both sides. Levels:  L3, L4, L5,  Medial Branch Level(s) Area Prepped: Posterior Lumbosacral Region DuraPrep (Iodine Povacrylex [0.7% available iodine] and Isopropyl Alcohol, 74% w/w) Safety Precautions: Aspiration looking for blood return was conducted prior to all injections. At no point did we inject any substances, as a needle was being advanced. Before injecting, the patient was told to immediately notify me if he was experiencing any new onset of "ringing in the ears, or metallic taste in the mouth". No attempts were made at seeking any paresthesias. Safe injection practices and needle disposal techniques used. Medications properly checked  for expiration dates. SDV (single dose vial) medications used. After the completion of the procedure, all disposable equipment used was discarded in the proper designated medical waste containers. Local Anesthesia: Protocol guidelines were followed. The patient was positioned over the fluoroscopy table. The area was prepped in the usual manner. The time-out was completed. The target area was identified using fluoroscopy. A 12-in long, straight, sterile hemostat was used with fluoroscopic guidance to locate the targets for each level blocked. Once located, the skin was marked with an approved surgical skin marker. Once all sites were marked, the skin (epidermis, dermis, and hypodermis), as well as deeper tissues (fat, connective tissue and muscle) were infiltrated with a small amount of a short-acting local anesthetic, loaded on a 10cc syringe with a 25G, 1.5-in  Needle. An appropriate amount of time was allowed for local anesthetics to take effect before proceeding to the next step. Local Anesthetic: Lidocaine 2.0% The unused portion of the local anesthetic was discarded in the proper designated containers. Technical explanation of process:   L3 Medial Branch Nerve Block (MBB): The target area for the L3 medial branch  is at the junction of the postero-lateral aspect of the superior articular process and the superior, posterior, and medial edge of the transverse process of L4. Under fluoroscopic guidance, a Quincke needle was inserted until contact was made with os over the superior postero-lateral aspect of the pedicular shadow (target area). After negative aspiration for blood, 1.5 mL of the nerve block solution was injected without difficulty or complication. The needle was removed intact. L4 Medial Branch Nerve Block (MBB): The target area for the L4 medial branch is at the junction of the postero-lateral aspect of the superior articular process and the superior, posterior, and medial edge of the transverse process of L5. Under fluoroscopic guidance, a Quincke needle was inserted until contact was made with os over the superior postero-lateral aspect of the pedicular shadow (target area). After negative aspiration for blood, 1.5 mL of the nerve block solution was injected without difficulty or complication. The needle was removed intact. L5 Medial Branch Nerve Block (MBB): The target area for the L5 medial branch is at the junction of the postero-lateral aspect of the superior articular process and the superior, posterior, and medial edge of the sacral ala. Under fluoroscopic guidance, a Quincke needle was inserted until contact was made with os over the superior postero-lateral aspect of the pedicular shadow (target area). After negative aspiration for blood, 1.68mL of the nerve block solution was injected without difficulty or complication. The needle was removed intact. Nerve block solution: 10 cc solution made of 8 cc of 0.2% ropivacaine, 2 cc of Decadron 10 mg/cc.  1.5 cc injected at each level above bilaterally.  The unused portion of the solution was discarded in the proper designated containers. Procedural Needles: 22-gauge, 3.5-inch, Quincke needles used for all levels.  Once the entire procedure was completed,  the treated area was cleaned, making sure to leave some of the prepping solution back to take advantage of its long term bactericidal properties.   Illustration of the posterior view of the lumbar spine and the posterior neural structures. Laminae of L2 through S1 are labeled. DPRL5, dorsal primary ramus of L5; DPRS1, dorsal primary ramus of S1; DPR3, dorsal primary ramus of L3; FJ, facet (zygapophyseal) joint L3-L4; I, inferior articular process of L4; LB1, lateral branch of dorsal primary ramus of L1; IAB, inferior articular branches from L3 medial branch (supplies L4-L5 facet joint); IBP, intermediate branch  plexus; MB3, medial branch of dorsal primary ramus of L3; NR3, third lumbar nerve root; S, superior articular process of L5; SAB, superior articular branches from L4 (supplies L4-5 facet joint also); TP3, transverse process of L3.  Vitals:   11/13/20 1154 11/13/20 1159 11/13/20 1204 11/13/20 1209  BP: (!) 183/88 (!) 179/87 (!) 176/88 (!) 197/97  Pulse:      Resp: 17 15 15 13   Temp:      SpO2: 100% 100% 100% 100%  Weight:      Height:         Start Time: 1154 hrs. End Time: 1209 hrs.  Imaging Guidance (Spinal):          Type of Imaging Technique: Fluoroscopy Guidance (Spinal) Indication(s): Assistance in needle guidance and placement for procedures requiring needle placement in or near specific anatomical locations not easily accessible without such assistance. Exposure Time: Please see nurses notes. Contrast: None used. Fluoroscopic Guidance: I was personally present during the use of fluoroscopy. "Tunnel Vision Technique" used to obtain the best possible view of the target area. Parallax error corrected before commencing the procedure. "Direction-depth-direction" technique used to introduce the needle under continuous pulsed fluoroscopy. Once target was reached, antero-posterior, oblique, and lateral fluoroscopic projection used confirm needle placement in all planes. Images  permanently stored in EMR. Interpretation: No contrast injected. I personally interpreted the imaging intraoperatively. Adequate needle placement confirmed in multiple planes. Permanent images saved into the patient's record.   Post-operative Assessment:  Post-procedure Vital Signs:  Pulse/HCG Rate: (!) 5667 Temp: (!) 97.3 F (36.3 C) Resp: 13 BP: (!) 197/97 SpO2: 100 %  EBL: None  Complications: No immediate post-treatment complications observed by team, or reported by patient.  Note: The patient tolerated the entire procedure well. A repeat set of vitals were taken after the procedure and the patient was kept under observation following institutional policy, for this type of procedure. Post-procedural neurological assessment was performed, showing return to baseline, prior to discharge. The patient was provided with post-procedure discharge instructions, including a section on how to identify potential problems. Should any problems arise concerning this procedure, the patient was given instructions to immediately contact us, at any time, without hesitation. In any case, we plan to contact the patient by telephone for a follow-up status report regarding this interventional procedure.  Comments:  No additional relevant information.  Plan of Care  Orders:  Orders Placed This Encounter  Procedures  . [facility] C-ARM 1-60 MIN NO REPORT    Intraoperative interpretation by procedural physician at [place].    Standing Status:   Standing    Number of Occurrences:   1    Order Specific Question:   Reason for exam:    Answer:   Assistance in needle guidance and placement for procedures requiring needle placement in or near specific anatomical locations not easily accessible without such assistance.    Medications ordered for procedure: Meds ordered this encounter  Medications  . lidocaine (XYLOCAINE) 2 % (with pres) injection 400 mg  . ropivacaine (PF) 2 mg/mL (0.2%)  (NAROPIN) injection 9 mL  . dexamethasone (DECADRON) injection 10 mg  . ropivacaine (PF) 2 mg/mL (0.2%) (NAROPIN) injection 9 mL  . dexamethasone (DECADRON) injection 10 mg   Medications administered: We administered lidocaine, ropivacaine (PF) 2 mg/mL (0.2%), dexamethasone, ropivacaine (PF) 2 mg/mL (0.2%), and dexamethasone.  See the medical record for exact dosing, route, and time of administration.  Follow-up plan:   Return in about 3 weeks (  around 12/04/2020) for face-to-face  PP  MR.    Recent Visits Date Type Provider Dept  10/17/20 Office Visit [name], MD [facility]  09/17/20 Office Visit [name], MD [facility]  Showing recent visits within past 90 days and meeting all other requirements Today's Visits Date Type Provider Dept  11/13/20 Procedure visit [name], MD [facility]  Showing today's visits and meeting all other requirements Future Appointments Date Type Provider Dept  12/04/20 Appointment [name], MD [facility]  Showing future appointments within next 90 days and meeting all other requirements  Disposition: Discharge home  Discharge (Date  Time): 11/13/2020; 1215 hrs.   Primary Care Physician: [name], NP Location: [facility] Note by: [name], MD Date: 11/13/2020; Time: 1:31 PM  Disclaimer:  Medicine is not an exact science. The only guarantee in medicine is that nothing is guaranteed. It is important to note that the decision to proceed with this intervention was based on the information collected from the patient. The Data and conclusions were drawn from the patient's questionnaire, the interview, and the physical examination. Because the information was provided in large part by the patient, it cannot be guaranteed that it has not been purposely or unconsciously manipulated. Every effort has been made to obtain as much relevant data as possible for this  evaluation. It is important to note that the conclusions that lead to this procedure are derived in large part from the available data. Always take into account that the treatment will also be dependent on availability of resources and existing treatment guidelines, considered by other Pain Management Practitioners as being common knowledge and practice, at the time of the intervention. For Medico-Legal purposes, it is also important to point out that variation in procedural techniques and pharmacological choices are the acceptable norm. The indications, contraindications, technique, and results of the above procedure should only be interpreted and judged by a Board-Certified Interventional Pain Specialist with extensive familiarity and expertise in the same exact procedure and technique.

## 2020-11-13 NOTE — Patient Instructions (Signed)

## 2020-11-14 ENCOUNTER — Telehealth: Payer: Self-pay | Admitting: *Deleted

## 2020-11-14 NOTE — Telephone Encounter (Signed)
Voicemail left with patient re; procedure on yesterday to call our office if there are questions or concerns.

## 2020-11-28 ENCOUNTER — Encounter: Payer: Self-pay | Admitting: Physician Assistant

## 2020-11-28 ENCOUNTER — Ambulatory Visit (INDEPENDENT_AMBULATORY_CARE_PROVIDER_SITE_OTHER): Payer: Medicare Other | Admitting: Physician Assistant

## 2020-11-28 ENCOUNTER — Other Ambulatory Visit: Payer: Self-pay

## 2020-11-28 VITALS — BP 170/92 | HR 70 | Ht 69.0 in | Wt 145.0 lb

## 2020-11-28 DIAGNOSIS — R350 Frequency of micturition: Secondary | ICD-10-CM | POA: Diagnosis not present

## 2020-11-28 DIAGNOSIS — N401 Enlarged prostate with lower urinary tract symptoms: Secondary | ICD-10-CM

## 2020-11-28 LAB — BLADDER SCAN AMB NON-IMAGING

## 2020-11-28 MED ORDER — MIRABEGRON ER 25 MG PO TB24
25.0000 mg | ORAL_TABLET | Freq: Every day | ORAL | 3 refills | Status: DC
Start: 2020-11-28 — End: 2021-01-14

## 2020-11-28 NOTE — Patient Instructions (Signed)
Please restart Chlorthalidone and follow up with your PCP or cardiologist if you would like to consider changing this medication. If you continue to have elevated blood pressure and your PCP or cardiologist are uncomfortable with you continuing Myrbetriq, we can switch you to its sibling medication called Gemtesa (vibegron), which does not carry a risk of increasing blood pressure.

## 2020-11-28 NOTE — Progress Notes (Signed)
11/28/2020 2:09 PM   [name] 08-20-1946 [id number]  CC: Chief Complaint  Patient presents with  . Benign Prostatic Hypertrophy    HPI: Grant Young is a 75 y.o. male with PMH BPH with weak stream, frequency, and nocturia s/p greenlight PVP approximately 15 years ago; ED on bimix; nephrolithiasis; white coat syndrome; hypertension; and multiple medication allergies and intolerances to urologic agents who presents today for symptom recheck on Myrbetriq 25 mg daily.  He was started on Myrbetriq after undergoing cystoscopy with Dr. Diamantina Providence on 10/30/2020.  Today he reports having stopped chlorthalidone approximately 5 days after his last clinic visit.  He states his urinary frequency began to improve 2 weeks after cystoscopy and has continued to improve.  He is now voiding 7-8 times daily and has nocturia x1.  He states he noticed no change whatsoever in his urinary frequency for the first 7 to 10 days following cessation of chlorthalidone.  Today he reports concern regarding his triage vitals.  Initial BP 170/92, repeat BP following her visit today 174/88.  Patient states he does have a history of whitecoat syndrome and that his BP today is higher than he has been taking it at home, typically 150/90.  He is concerned that Myrbetriq may be contributing to his hypertension and wonders if he should restart chlorthalidone or potentially try a different blood pressure medication.  IPSS 3/delighted as below, previously 23/unhappy.  PVR 53 mL.   IPSS    Row Name 11/28/20 1300         International Prostate Symptom Score   How often have you had the sensation of not emptying your bladder? Not at All     How often have you had to urinate less than every two hours? Less than 1 in 5 times     How often have you found you stopped and started again several times when you urinated? Not at All     How often have you found it difficult to postpone urination? Not at All     How often have you  had a weak urinary stream? Less than 1 in 5 times     How often have you had to strain to start urination? Not at All     How many times did you typically get up at night to urinate? 1 Time     Total IPSS Score 3           Quality of Life due to urinary symptoms   If you were to spend the rest of your life with your urinary condition just the way it is now how would you feel about that? Delighted            PMH: Past Medical History:  Diagnosis Date  . Anxiety    Per New Patient Packet  . Chronic back pain    Per New Patient Packet  . Chronic heart disease    Per New Patient Packet  . Depression   . Headache   . History of CT scan of brain 09/14/2018   Per New Patient Packet  . History of depression    Per New Patient Packet  . History of gastritis    Per New Patient Packet  . History of headache    Per New Patient Packet  . History of kidney stones    Per New Patient Packet  . History of neuropathy    Per New Patient Packet  . Hypertension    Per  New Patient Packet  . Insomnia   . Kidney stone   . Lumbar radicular pain    Per New Patient Packet  . Lyme disease    Per New Patient Packet  . Myocardial infarction ([place])   . Overactive bladder   . Skin cancer, basal cell   . Sleep trouble    Per New Patient Packet  . Squamous cell skin cancer   . Substance abuse [facility])     Surgical History: Past Surgical History:  Procedure Laterality Date  . BACK SURGERY    . CARDIAC CATHETERIZATION    . CHOLECYSTECTOMY  2017  . COLONOSCOPY  09/15/2015   Per New Patient Packet  . COLONOSCOPY WITH PROPOFOL N/A 10/03/2020   Procedure: COLONOSCOPY WITH PROPOFOL;  Surgeon: [name], MD;  [facility]: [facility] ENDOSCOPY;  Service: Gastroenterology;  Laterality: N/A;  . ESOPHAGOGASTRODUODENOSCOPY (EGD) WITH PROPOFOL N/A 10/03/2020   Procedure: ESOPHAGOGASTRODUODENOSCOPY (EGD) WITH PROPOFOL;  Surgeon: [name], MD;  Location: [facility] ENDOSCOPY;  Service: Gastroenterology;  Laterality:  N/A;  . FOOT SURGERY Bilateral 03/18/2020  . FOOT SURGERY  08/032021  . GALLBLADDER SURGERY  09/15/2015   Gallbladder Removal. Procedure done by Dr.Beverly. Per New Patient Packet  . KIDNEY STONE SURGERY  09/14/1980   Too many to count. Per New Patient Packet 09/14/1980-09/15/1995  . KIDNEY STONE SURGERY  09/15/1995   Too many to count. Per New Patient Packet  . LITHOTRIPSY  09/14/2014   Per New Patient Packet  . LITHOTRIPSY  09/14/1996   No Stints Used. Per New Patient Packet  . LITHOTRIPSY  09/14/1997   No Stints used. Per New Patient Packet  . PAIN PUMP IMPLANTATION    . PAIN PUMP REMOVAL    . SHOULDER SURGERY Right 1984  . SIGMOIDOSCOPY  09/14/2017   Per New Patient Packet  . SPINAL CORD STIMULATOR IMPLANT    . TONSILLECTOMY    . UPPER GI ENDOSCOPY      Home Medications:  Allergies as of 11/28/2020      Reactions   Ace Inhibitors    Other reaction(s): Cough   Fluoxetine Anxiety   "made me fall asleep" per pt "bad headaches and "makes Me Crazy" historical allergy noted in [employer] "made me fall asleep" per pt "bad headaches and "makes Me Crazy" Per New Patient Packet.    Metoclopramide    Other reaction(s): Other (See Comments), Other (See Comments), Unknown Tardive Dyskinesia  historical allergy noted in [employer] Tardive Dyskinesia  Per New Patient Packet.   Nalbuphine    Used Post Back surgery- Anesthesiologist Error. Patient had Narcotic Withdraw. Per New Patient Packet.    Other    Other reaction(s): Other (See Comments) Altered mental status in combo with narcotics at previous hospitalization - Full Withdrawal Symptoms Other reaction(s): Rash   Amoxicillin-pot Clavulanate Nausea Only   Per New Patient Packet.   Doxazosin Rash   Other reaction(s): Other - See Comments, Rash UNKNOWN REACTION UNKNOWN REACTION   Duloxetine Nausea Only   Per New Patient Packet.    Penicillins Nausea Only   Per New Patient Packet.   Tamsulosin Itching, Anxiety    Restless, Flushing, Heavy Chest, Itching, Hyperactive mood and Anxiety. Unable to handle side effects. Per New Patient Packet.    Trazodone And Nefazodone Itching, Anxiety, Rash   Headache. "INCREASED MY ANXIETY AND HEARTRATE" Flushing, tachycardia "INCREASED MY ANXIETY AND HEARTRATE" Per New Patient Packet.   Amlodipine    Shaking, unsure of reaction. Per New Patient Packet.    Cinoxacin  GI Intolerance, and Dizziness. Per New Patient Packet.    Ciprofloxacin    Other reaction(s): Unknown   Nebivolol    Other reaction(s): Unknown   Olanzapine    Headache and unable to sleep for 3 nights. Per New Patient Packet.   Olmesartan    Other reaction(s): Unknown   Pregabalin    Confusion, Lack of concentration, dizziness, and likely drowsiness. Per New Patient Packet.    Prostaglandins    Other reaction(s): Other (See Comments) Intolerance   Thyroid Hormones    Other reaction(s): Other (See Comments) Thyroid (Nature Thyroid) contraindicated with some of your other medications.   Zolpidem    Nightmares, Ineffective after 2 days. Per New Patient Packet.    Duloxetine Hcl    Other reaction(s): Rash   Fluoxetine Hcl    Other reaction(s): Rash   Phenytoin Anxiety   Hyperactivity, and Ineffective. Per New Patient Packet.       Medication List       Accurate as of November 28, 2020  2:09 PM. If you have any questions, ask your nurse or doctor.        amitriptyline 10 MG tablet Commonly known as: ELAVIL Take 20 mg by mouth at bedtime.   anastrozole 1 MG tablet Commonly known as: ARIMIDEX Take by mouth.   butalbital-acetaminophen-caffeine 50-325-40 MG tablet Commonly known as: FIORICET Take by mouth.   chlorthalidone 25 MG tablet Commonly known as: HYGROTON Take 1 tablet (25 mg total) by mouth daily.   Cholecalciferol 125 MCG (5000 UT) Tabs Take by mouth.   cholestyramine 4 g packet Commonly known as: QUESTRAN Take 1 packet (4 g total) by mouth 3 (three) times daily.    DEPO-TESTOSTERONE IM Inject 0.4 mLs into the muscle every 14 (fourteen) days.   testosterone cypionate 200 MG/ML injection Commonly known as: DEPOTESTOSTERONE CYPIONATE INJECT 0.3 ML (60 MG) EVERY 2 WEEKS INTO THE MUSCLE   dicyclomine 20 MG tablet Commonly known as: BENTYL Take 20 mg by mouth 4 (four) times daily.   DIGESTION GB PO Take 1 capsule by mouth daily.   escitalopram 20 MG tablet Commonly known as: LEXAPRO Take 1 tablet (20 mg total) by mouth daily.   ezetimibe 10 MG tablet Commonly known as: ZETIA Take 1 tablet (10 mg total) by mouth daily.   GLUTATHIONE PO Take 1 tablet by mouth daily.   hydrocortisone 25 MG suppository Commonly known as: ANUSOL-HC Place 1 suppository (25 mg total) rectally at bedtime as needed for hemorrhoids or anal itching.   IMODIUM PO Take by mouth as needed.   LORazepam 0.5 MG tablet Commonly known as: ATIVAN Take 1 tablet (0.5 mg total) by mouth 2 (two) times daily.   Melatonin 10 MG Tabs Take 10 mg by mouth at bedtime.   mirabegron ER 25 MG Tb24 tablet Commonly known as: MYRBETRIQ Take 1 tablet (25 mg total) by mouth daily.   NONFORMULARY OR COMPOUNDED ITEM Bi-Mix Injection  Papaverine HCI/Phentolamine Mesylate 150mg /55mg /Vial Inject 68ml prior to intercourse   For intracavernosal use only   ondansetron 4 MG tablet Commonly known as: ZOFRAN Take 4 mg by mouth as needed for nausea or vomiting.   potassium chloride 10 MEQ tablet Commonly known as: KLOR-CON Take by mouth.   prochlorperazine 10 MG tablet Commonly known as: COMPAZINE Take 10 mg by mouth as needed.   propranolol 40 MG tablet Commonly known as: INDERAL TAKE 1 TABLET (40 MG TOTAL) BY MOUTH IN THE MORNING AND AT BEDTIME.   RABEprazole 20  MG tablet Commonly known as: ACIPHEX TAKE 1 TABLET BY MOUTH TWICE A DAY   sucralfate 1 g tablet Commonly known as: CARAFATE Take 1 g by mouth 4 (four) times daily as needed.   tizanidine 2 MG capsule Commonly  known as: ZANAFLEX Take 1 capsule (2 mg total) by mouth every 6 (six) hours as needed.   valACYclovir 1000 MG tablet Commonly known as: VALTREX TAKE 2 TABLETS BY MOUTH AND REPEAT IN 12 HOURS FOR COLD SORE   Vitamin B-12 5000 MCG Lozg Take 1 lozenge by mouth daily.       Allergies:  Allergies  Allergen Reactions  . Ace Inhibitors     Other reaction(s): Cough  . Fluoxetine Anxiety    "made me fall asleep" per pt "bad headaches and "makes Me Crazy" historical allergy noted in [employer] "made me fall asleep" per pt "bad headaches and "makes Me Crazy" Per New Patient Packet.    . Metoclopramide     Other reaction(s): Other (See Comments), Other (See Comments), Unknown Tardive Dyskinesia  historical allergy noted in [employer] Tardive Dyskinesia  Per New Patient Packet.  . Nalbuphine     Used Post Back surgery- Anesthesiologist Error. Patient had Narcotic Withdraw. Per New Patient Packet.   . Other     Other reaction(s): Other (See Comments) Altered mental status in combo with narcotics at previous hospitalization - Full Withdrawal Symptoms Other reaction(s): Rash  . Amoxicillin-Pot Clavulanate Nausea Only    Per New Patient Packet.  . Doxazosin Rash    Other reaction(s): Other - See Comments, Rash UNKNOWN REACTION UNKNOWN REACTION   . Duloxetine Nausea Only    Per New Patient Packet.   Marland Kitchen Penicillins Nausea Only    Per New Patient Packet.  . Tamsulosin Itching and Anxiety    Restless, Flushing, Heavy Chest, Itching, Hyperactive mood and Anxiety. Unable to handle side effects. Per New Patient Packet.    . Trazodone And Nefazodone Itching, Anxiety and Rash    Headache. "INCREASED MY ANXIETY AND HEARTRATE" Flushing, tachycardia "INCREASED MY ANXIETY AND HEARTRATE" Per New Patient Packet.   . Amlodipine     Shaking, unsure of reaction. Per New Patient Packet.    . Cinoxacin     GI Intolerance, and Dizziness. Per New Patient Packet.   . Ciprofloxacin     Other  reaction(s): Unknown  . Nebivolol     Other reaction(s): Unknown  . Olanzapine     Headache and unable to sleep for 3 nights. Per New Patient Packet.   . Olmesartan     Other reaction(s): Unknown  . Pregabalin     Confusion, Lack of concentration, dizziness, and likely drowsiness. Per New Patient Packet.    . Prostaglandins     Other reaction(s): Other (See Comments) Intolerance  . Thyroid Hormones     Other reaction(s): Other (See Comments) Thyroid (Nature Thyroid) contraindicated with some of your other medications.  . Zolpidem     Nightmares, Ineffective after 2 days. Per New Patient Packet.   . Duloxetine Hcl     Other reaction(s): Rash  . Fluoxetine Hcl     Other reaction(s): Rash  . Phenytoin Anxiety    Hyperactivity, and Ineffective. Per New Patient Packet.     Family History: Family History  Problem Relation Age of Onset  . Heart disease Father   . Heart failure Father   . Hypertension Father   . Stroke Father   . Stroke Mother   . Dementia Mother   .  Kidney Stones Daughter   . Anxiety disorder Daughter   . OCD Daughter     Social History:   reports that he has never smoked. He has never used smokeless tobacco. He reports current alcohol use. He reports previous drug use.  Physical Exam: BP (!) 170/92   Pulse 70   Ht 5\' 9"  (1.753 m)   Wt 145 lb (65.8 kg)   BMI 21.41 kg/m   Constitutional:  Alert and oriented, no acute distress, nontoxic appearing HEENT: [place], AT Cardiovascular: No clubbing, cyanosis, or edema Respiratory: Normal respiratory effort, no increased work of breathing Skin: No rashes, bruises or suspicious lesions Neurologic: Grossly intact, no focal deficits, moving all 4 extremities Psychiatric: Normal mood and affect  Laboratory Data: Results for orders placed or performed in visit on 11/28/20  Bladder Scan (Post Void Residual) in office  Result Value Ref Range   Scan Result 89mL    Assessment & Plan:   1. Benign prostatic  hyperplasia with urinary frequency Urinary symptoms significantly improved on Myrbetriq 25 mg daily.  Will refill this x1 year.  I explained to the patient that Myrbetriq can cause elevations in blood pressure, however these elevations are typically quite small and I do not believe that Myrbetriq alone can account for his recently elevated blood pressure.  I explained that he is likely experiencing whitecoat syndrome in clinic today and that I believe his recent cessation of chlorthalidone may be playing a bigger role here.  I encouraged him to restart chlorthalidone and follow-up with his PCP versus cardiologist to discuss alternative agents if he is curious about doing so.  If his blood pressure remains uncontrolled or they are concerned with him continuing Myrbetriq, we may consider switching him to [place], which does not carry a risk of elevated BP.  Patient expressed understanding. - Bladder Scan (Post Void Residual) in office - mirabegron ER (MYRBETRIQ) 25 MG TB24 tablet; Take 1 tablet (25 mg total) by mouth daily.  Dispense: 90 tablet; Refill: 3  Return in about 1 year (around 11/28/2021) for Annual f/u with IPSS and PVR.  Debroah Loop, PA-C  [facility] [address], [place], [place] [address] [phone]

## 2020-12-02 ENCOUNTER — Other Ambulatory Visit: Payer: Self-pay

## 2020-12-02 ENCOUNTER — Ambulatory Visit (INDEPENDENT_AMBULATORY_CARE_PROVIDER_SITE_OTHER): Payer: Medicare Other | Admitting: Internal Medicine

## 2020-12-02 VITALS — BP 158/82 | HR 65 | Temp 97.1°F | Wt 142.0 lb

## 2020-12-02 DIAGNOSIS — R251 Tremor, unspecified: Secondary | ICD-10-CM

## 2020-12-02 DIAGNOSIS — I10 Essential (primary) hypertension: Secondary | ICD-10-CM | POA: Diagnosis not present

## 2020-12-02 DIAGNOSIS — G43701 Chronic migraine without aura, not intractable, with status migrainosus: Secondary | ICD-10-CM

## 2020-12-02 DIAGNOSIS — N3281 Overactive bladder: Secondary | ICD-10-CM | POA: Diagnosis not present

## 2020-12-02 MED ORDER — LABETALOL HCL 100 MG PO TABS: 100.0000 mg | ORAL_TABLET | Freq: Two times a day (BID) | ORAL | 0 refills | Status: DC

## 2020-12-02 NOTE — Progress Notes (Signed)
Subjective:    Patient ID: Grant Young, male    DOB: June 29, 1946, 75 y.o.   MRN: [id number]  HPI  Pt presents to the today for follow-up.  He reports he saw the urologist for follow-up of overactive bladder.  He reports the urologist recommend switching from Myrbetriq to [place] as this has no effect on raising blood pressure like Myrbetriq does.  He is currently being treated for HTN with Chlorthalidone and Propanolol.  He reports the propanolol also helps with his tremor and migraines.  He did hold the chlorthalidone to see if it helped with his overactive bladder but has restarted this.  His BP today is 158/82.  He also reports postnasal drip.  He denies sore throat cough or shortness of breath.  He denies runny nose, nasal congestion, ear pain, loss of taste/smell.  He has not taken anything OTC for his symptoms.  Review of Systems      Past Medical History:  Diagnosis Date   Anxiety    Per New Patient Packet   Chronic back pain    Per New Patient Packet   Chronic heart disease    Per New Patient Packet   Depression    Headache    History of CT scan of brain 09/14/2018   Per New Patient Packet   History of depression    Per New Patient Packet   History of gastritis    Per New Patient Packet   History of headache    Per New Patient Packet   History of kidney stones    Per New Patient Packet   History of neuropathy    Per New Patient Packet   Hypertension    Per New Patient Packet   Insomnia    Kidney stone    Lumbar radicular pain    Per New Patient Packet   Lyme disease    Per New Patient Packet   Myocardial infarction [facility])    Overactive bladder    Skin cancer, basal cell    Sleep trouble    Per New Patient Packet   Squamous cell skin cancer    Substance abuse ([place])     Current Outpatient Medications  Medication Sig Dispense Refill   amitriptyline (ELAVIL) 10 MG tablet Take 20 mg by mouth at bedtime.      anastrozole (ARIMIDEX)  1 MG tablet Take by mouth.     butalbital-acetaminophen-caffeine (FIORICET) 50-325-40 MG tablet Take by mouth.     chlorthalidone (HYGROTON) 25 MG tablet Take 1 tablet (25 mg total) by mouth daily. 90 tablet 1   Cholecalciferol 125 MCG (5000 UT) TABS Take by mouth.     cholestyramine (QUESTRAN) 4 g packet Take 1 packet (4 g total) by mouth 3 (three) times daily. 270 packet 3   Cyanocobalamin (VITAMIN B-12) 5000 MCG LOZG Take 1 lozenge by mouth daily.      dicyclomine (BENTYL) 20 MG tablet Take 20 mg by mouth 4 (four) times daily.     Digestive Aids Mixture (DIGESTION GB PO) Take 1 capsule by mouth daily.      escitalopram (LEXAPRO) 20 MG tablet Take 1 tablet (20 mg total) by mouth daily. 90 tablet 1   ezetimibe (ZETIA) 10 MG tablet Take 1 tablet (10 mg total) by mouth daily. 90 tablet 1   GLUTATHIONE PO Take 1 tablet by mouth daily.     labetalol (NORMODYNE) 100 MG tablet Take 1 tablet (100 mg total) by mouth 2 (two) times daily. [place]  tablet 0   Loperamide HCl (IMODIUM PO) Take by mouth as needed.      LORazepam (ATIVAN) 0.5 MG tablet Take 1 tablet (0.5 mg total) by mouth 2 (two) times daily. 60 tablet 0   Melatonin 10 MG TABS Take 10 mg by mouth at bedtime.      mirabegron ER (MYRBETRIQ) 25 MG TB24 tablet Take 1 tablet (25 mg total) by mouth daily. 90 tablet 3   NONFORMULARY OR COMPOUNDED ITEM Bi-Mix Injection  Papaverine HCI/Phentolamine Mesylate 150mg /55mg /Vial Inject 69ml prior to intercourse   For intracavernosal use only 5 each 11   ondansetron (ZOFRAN) 4 MG tablet Take 4 mg by mouth as needed for nausea or vomiting.      prochlorperazine (COMPAZINE) 10 MG tablet Take 10 mg by mouth as needed.     RABEprazole (ACIPHEX) 20 MG tablet TAKE 1 TABLET BY MOUTH TWICE A DAY 180 tablet 1   sucralfate (CARAFATE) 1 g tablet Take 1 g by mouth 4 (four) times daily as needed.      Testosterone Cypionate (DEPO-TESTOSTERONE IM) Inject 0.4 mLs into the muscle every 14 (fourteen)  days.      testosterone cypionate (DEPOTESTOSTERONE CYPIONATE) 200 MG/ML injection INJECT 0.3 ML (60 MG) EVERY 2 WEEKS INTO THE MUSCLE     tizanidine (ZANAFLEX) 2 MG capsule Take 1 capsule (2 mg total) by mouth every 6 (six) hours as needed. 120 capsule 1   valACYclovir (VALTREX) 1000 MG tablet TAKE 2 TABLETS BY MOUTH AND REPEAT IN 12 HOURS FOR COLD SORE 4 tablet 1   No current facility-administered medications for this visit.    Allergies  Allergen Reactions   Ace Inhibitors     Other reaction(s): Cough   Fluoxetine Anxiety    "made me fall asleep" per pt "bad headaches and "makes Me Crazy" historical allergy noted in [employer] "made me fall asleep" per pt "bad headaches and "makes Me Crazy" Per New Patient Packet.     Metoclopramide     Other reaction(s): Other (See Comments), Other (See Comments), Unknown Tardive Dyskinesia  historical allergy noted in [employer] Tardive Dyskinesia  Per New Patient Packet.   Nalbuphine     Used Post Back surgery- Anesthesiologist Error. Patient had Narcotic Withdraw. Per New Patient Packet.    Other     Other reaction(s): Other (See Comments) Altered mental status in combo with narcotics at previous hospitalization - Full Withdrawal Symptoms Other reaction(s): Rash   Amoxicillin-Pot Clavulanate Nausea Only    Per New Patient Packet.   Doxazosin Rash    Other reaction(s): Other - See Comments, Rash UNKNOWN REACTION UNKNOWN REACTION    Duloxetine Nausea Only    Per New Patient Packet.    Penicillins Nausea Only    Per New Patient Packet.   Tamsulosin Itching and Anxiety    Restless, Flushing, Heavy Chest, Itching, Hyperactive mood and Anxiety. Unable to handle side effects. Per New Patient Packet.     Trazodone And Nefazodone Itching, Anxiety and Rash    Headache. "INCREASED MY ANXIETY AND HEARTRATE" Flushing, tachycardia "INCREASED MY ANXIETY AND HEARTRATE" Per New Patient Packet.    Amlodipine     Shaking,  unsure of reaction. Per New Patient Packet.     Cinoxacin     GI Intolerance, and Dizziness. Per New Patient Packet.    Ciprofloxacin     Other reaction(s): Unknown   Nebivolol     Other reaction(s): Unknown   Olanzapine     Headache and unable to sleep  for 3 nights. Per New Patient Packet.    Olmesartan     Other reaction(s): Unknown   Pregabalin     Confusion, Lack of concentration, dizziness, and likely drowsiness. Per New Patient Packet.     Prostaglandins     Other reaction(s): Other (See Comments) Intolerance   Thyroid Hormones     Other reaction(s): Other (See Comments) Thyroid (Nature Thyroid) contraindicated with some of your other medications.   Zolpidem     Nightmares, Ineffective after 2 days. Per New Patient Packet.    Duloxetine Hcl     Other reaction(s): Rash   Fluoxetine Hcl     Other reaction(s): Rash   Phenytoin Anxiety    Hyperactivity, and Ineffective. Per New Patient Packet.     Family History  Problem Relation Age of Onset   Heart disease Father    Heart failure Father    Hypertension Father    Stroke Father    Stroke Mother    Dementia Mother    Kidney Stones Daughter    Anxiety disorder Daughter    OCD Daughter     Social History   Socioeconomic History   Marital status: Married    Spouse name: Not on file   Number of children: Not on file   Years of education: Not on file   Highest education level: Not on file  Occupational History   Not on file  Tobacco Use   Smoking status: Never Smoker   Smokeless tobacco: Never Used  Vaping Use   Vaping Use: Never used  Substance and Sexual Activity   Alcohol use: Yes    Comment: 1 Drink a Month.   Drug use: Not Currently   Sexual activity: Yes    Birth control/protection: None  Other Topics Concern   Not on file  Social History Narrative   Tobacco use, amount per day now: None   Past tobacco use, amount per day: None   How many years did you use  tobacco: 0   Alcohol use (drinks per week): 0-1 Month   Diet:   Do you drink/eat things with caffeine: Occasionally ( Hot Chocolate and maybe 1/4 of 16oz Pepsi 2-3 times a week.   Marital status: Married                                  What year were you married? 1970   Do you live in a house, apartment, assisted living, condo, trailer, etc.? House   Is it one or more stories? One   How many persons live in your home? 2   Do you have pets in your home?( please list)  No   Highest Level of education completed: Masters   Current or past profession: Intensive Transport planner for [employer]   Do you exercise? Yes                                    Type and how often? [place], Recumbent Bike, 3 times a week. Try to get a 1.2 mile walk at least 3 times a week or more.     Do you have a living will? Yes   Do you have a DNR form?  No  If not, do you want to discuss one?   Do you have signed POA/HPOA forms? Yes                       If so, please bring to you appointment    Do you have any difficulty bathing or dressing yourself? No    Do you have difficulty preparing food or eating? No   Do you have difficulty managing your medications? No   Do you have any difficulty managing your finances? No   Do you have any difficulty affording your medications? No         Social Determinants of Radio broadcast assistant Strain: Not on file  Food Insecurity: Not on file  Transportation Needs: Not on file  Physical Activity: Not on file  Stress: Not on file  Social Connections: Not on file  Intimate Partner Violence: Not on file     Constitutional: Pt reports frequency headaches. Denies fever, malaise, fatigue, or abrupt weight changes.  HEENT: Patient reports postnasal drip.  Denies eye pain, eye redness, ear pain, ringing in the ears, wax buildup, runny nose, nasal congestion, bloody nose, or sore throat. Respiratory: Denies difficulty  breathing, shortness of breath, cough or sputum production.   Cardiovascular: Denies chest pain, chest tightness, palpitations or swelling in the hands or feet.  GU: Pt reports urinary frequency. Denies urgency,  pain with urination, burning sensation, blood in urine, odor or discharge. Neurological: Pt has history of a tremor. Denies dizziness, difficulty with memory, difficulty with   No other specific complaints in a complete review of systems (except as listed in HPI above).  Objective:   Physical Exam   BP (!) 158/82    Pulse 65    Temp (!) 97.1 F (36.2 C) (Temporal)    Wt 142 lb (64.4 kg)    SpO2 98%    BMI 20.97 kg/m  Wt Readings from Last 3 Encounters:  12/02/20 142 lb (64.4 kg)  11/28/20 145 lb (65.8 kg)  11/13/20 142 lb (64.4 kg)    General: Appears his stated age, well developed, well nourished in NAD. HEENT: Head: normal shape and size; Eyes: sclera white, EOMs intact; Throat/Mouth: Teeth present, mucosa pink and moist, + PND, no exudate, lesions or ulcerations noted.  Neck:  No adenopathy noted. Cardiovascular: Normal rate and rhythm.  Pulmonary/Chest: Normal effort. Musculoskeletal:  No difficulty with gait.  Neurological: Alert and oriented.   BMET    Component Value Date/Time   NA 145 05/20/2020 0000   K 3.2 (A) 05/20/2020 0000   CL 102 05/20/2020 0000   CO2 34 (A) 05/20/2020 0000   BUN 1 (A) 05/20/2020 0000   CREATININE 0.90 03/06/2020 1134   CALCIUM 9.8 05/20/2020 0000   GFRNONAA 87 05/20/2020 0000   GFRAA 101 05/20/2020 0000    Lipid Panel     Component Value Date/Time   CHOL 170 05/16/2020 0000   TRIG 59 05/16/2020 0000   HDL 56 05/16/2020 0000   LDLCALC 100 05/16/2020 0000    CBC    Component Value Date/Time   WBC 6.2 05/20/2020 0000   RBC 5.21 (A) 05/20/2020 0000   HGB 15.6 05/20/2020 0000   HCT 47 05/20/2020 0000   PLT 199 05/20/2020 0000    Hgb A1C Lab Results  Component Value Date   HGBA1C 5.1 10/17/2019            Assessment & Plan:

## 2020-12-03 ENCOUNTER — Ambulatory Visit: Payer: Medicare Other | Admitting: Nurse Practitioner

## 2020-12-03 ENCOUNTER — Encounter: Payer: Self-pay | Admitting: Internal Medicine

## 2020-12-03 DIAGNOSIS — N3281 Overactive bladder: Secondary | ICD-10-CM | POA: Insufficient documentation

## 2020-12-03 NOTE — Assessment & Plan Note (Signed)
D/C Propranolol Start Labetalol 100 mg BID Continue Fioricet and Amitriptyline Will monitor symptoms

## 2020-12-03 NOTE — Assessment & Plan Note (Signed)
D/c Propranolol Will trial Labetalol 100 mg BID

## 2020-12-03 NOTE — Patient Instructions (Signed)

## 2020-12-03 NOTE — Assessment & Plan Note (Signed)
Advised him to discuss with urology about switching from Myrbetriq to [place]

## 2020-12-03 NOTE — Assessment & Plan Note (Signed)
Will d/c Propranolol and start Labetalol 100 mg BID This should continue to help with tremor and migraines Continue Chlorthalidone for now Reinforced DASH diet  RTC in 1 week for BP check

## 2020-12-04 ENCOUNTER — Other Ambulatory Visit: Payer: Self-pay

## 2020-12-04 ENCOUNTER — Encounter: Payer: Self-pay | Admitting: Student in an Organized Health Care Education/Training Program

## 2020-12-04 ENCOUNTER — Ambulatory Visit
Payer: Worker's Compensation | Attending: Student in an Organized Health Care Education/Training Program | Admitting: Student in an Organized Health Care Education/Training Program

## 2020-12-04 VITALS — BP 128/90 | HR 79 | Temp 98.6°F | Resp 16 | Ht 69.0 in | Wt 142.0 lb

## 2020-12-04 DIAGNOSIS — G894 Chronic pain syndrome: Secondary | ICD-10-CM | POA: Diagnosis present

## 2020-12-04 DIAGNOSIS — M47816 Spondylosis without myelopathy or radiculopathy, lumbar region: Secondary | ICD-10-CM

## 2020-12-04 MED ORDER — HYDROCODONE-ACETAMINOPHEN 10-325 MG PO TABS: 1.0000 | ORAL_TABLET | Freq: Two times a day (BID) | ORAL | 0 refills | Status: DC | PRN

## 2020-12-04 MED ORDER — HYDROCODONE-ACETAMINOPHEN 10-325 MG PO TABS: 1.0000 | ORAL_TABLET | Freq: Two times a day (BID) | ORAL | 0 refills | Status: AC | PRN

## 2020-12-04 NOTE — Progress Notes (Signed)
Safety precautions to be maintained throughout the outpatient stay will include: orient to surroundings, keep bed in low position, maintain call bell within reach at all times, provide assistance with transfer out of bed and ambulation.  

## 2020-12-04 NOTE — Progress Notes (Signed)
PROVIDER NOTE: Information contained herein reflects review and annotations entered in association with encounter. Interpretation of such information and data should be left to medically-trained personnel. Information provided to patient can be located elsewhere in the medical record under "Patient Instructions". Document created using STT-dictation technology, any transcriptional errors that may result from process are unintentional.    Patient: Grant Young  Service Category: E/M  Provider: [name], MD  DOB: 03-16-1946  DOS: 12/04/2020  Specialty: Interventional Pain Management  MRN: [id number]  Setting: Ambulatory outpatient  PCP: [name], NP  Type: Established Patient    Referring Provider: [name], NP  Location: Office  Delivery: Face-to-face     HPI  [name], a 75 y.o. year old male, is here today because of his Lumbar facet arthropathy [M47.816]. [name] primary complain today is Back Pain (lower) Last encounter: My last encounter with him was on 11/13/2020. Pertinent problems: 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 Pain Assessment: Severity of Chronic pain is reported as a 4 /10. Location: Back Lower/to mid back. Onset: More than a month ago. Quality: Radiating,Sharp,Constant. Timing: Constant. Modifying factor(s): medications, cold packs. Vitals:  height is '5\' 9"'  (1.753 m) and weight is 142 lb (64.4 kg). His oral temperature is 98.6 F (37 C). His blood pressure is 128/90 and his pulse is 79. His respiration is 16 and oxygen saturation is 100%.   Reason for encounter: both, medication management and post-procedure assessment.      Post-Procedure Evaluation  Procedure (11/13/2020):   Type: Lumbar Facet, Medial Branch Block(s) #1 (for 2022, had 2 done in 2021)  Primary Purpose:  Therapeutic Region: Posterolateral Lumbosacral Spine Level: L3, L4, L5, Medial Branch Level(s). Injecting these levels blocks the L3-4, L4-5,  lumbar facet joints. Laterality: Bilateral  Sedation: Please see nurses note.  Effectiveness during initial hour after procedure(Ultra-Short Term Relief): 100 %   Local anesthetic used: Long-acting (4-6 hours) Effectiveness: Defined as any analgesic benefit obtained secondary to the administration of local anesthetics. This carries significant diagnostic value as to the etiological location, or anatomical origin, of the pain. Duration of benefit is expected to coincide with the duration of the local anesthetic used.  Effectiveness during initial 4-6 hours after procedure(Short-Term Relief): 100 % .  Long-term benefit: Defined as any relief past the pharmacologic duration of the local anesthetics.  Effectiveness past the initial 6 hours after procedure(Long-Term Relief): 30 %   Current benefits: Defined as benefit that persist at this time.   Analgesia:  30% Function: [name] reports improvement in function ROM: [name] reports improvement in ROM   Repeat PRN.   Pharmacotherapy Assessment   Analgesic: Hydrocodone 10 mg BID PRN    Monitoring: [place] PMP: PDMP reviewed during this encounter.       Pharmacotherapy: No side-effects or adverse reactions reported. Compliance: No problems identified. Effectiveness: Clinically acceptable.  Morley Kos, RN  12/04/2020  1:51 PM  Signed Safety precautions to be maintained throughout the outpatient stay will include: orient to surroundings, keep bed in low position, maintain call bell within reach at all times, provide assistance with transfer out of bed and ambulation.     UDS:  Summary  Date Value Ref Range Status  09/17/2020 Note  Final    Comment:    ==================================================================== Compliance Drug Analysis,  Ur ==================================================================== Specimen Alert  Note: Urinary creatinine is low; ability to detect some drugs may be compromised. Interpret results with caution. (Creatinine) ==================================================================== Test                             Result       Flag       Units  Drug Present and Declared for Prescription Verification   Lorazepam                      1107         EXPECTED   ng/mg creat    Source of lorazepam is a scheduled prescription medication.    Butalbital                     PRESENT      EXPECTED   Citalopram                     PRESENT      EXPECTED   Desmethylcitalopram            PRESENT      EXPECTED    Desmethylcitalopram is an expected metabolite of citalopram or the    enantiomeric form, escitalopram.    Acetaminophen                  PRESENT      EXPECTED   Propranolol                    PRESENT      EXPECTED  Drug Present not Declared for Prescription Verification   Oxazepam                       300          UNEXPECTED ng/mg creat   Temazepam                      357          UNEXPECTED ng/mg creat    Oxazepam and temazepam are expected metabolites of diazepam.    Oxazepam is also an expected metabolite of other benzodiazepine    drugs, including chlordiazepoxide, prazepam, clorazepate, halazepam,    and temazepam.  Oxazepam and temazepam are available as scheduled    prescription medications.  Drug Absent but Declared for Prescription Verification   Hydrocodone                    Not Detected UNEXPECTED ng/mg creat   Tizanidine                     Not Detected UNEXPECTED    Tizanidine, as indicated in the declared medication list, is not    always detected even when used as directed.    Amitriptyline                  Not Detected UNEXPECTED   Prochlorperazine               Not Detected UNEXPECTED   Salicylate                     Not Detected UNEXPECTED    Aspirin, as indicated in  the declared medication list, is not always    detected even when used as directed.  ==================================================================== Test  Result    Flag   Units      Ref Range   Creatinine              14        LL     mg/dL      >=20 ==================================================================== Declared Medications:  The flagging and interpretation on this report are based on the  following declared medications.  Unexpected results may arise from  inaccuracies in the declared medications.   **Note: The testing scope of this panel includes these medications:   Amitriptyline (Elavil)  Butalbital (Fioricet)  Butalbital (Fiorinal)  Escitalopram (Lexapro)  Hydrocodone (Norco)  Lorazepam (Ativan)  Prochlorperazine (Compazine)  Propranolol (Inderal)   **Note: The testing scope of this panel does not include small to  moderate amounts of these reported medications:   Acetaminophen (Fioricet)  Acetaminophen (Norco)  Aspirin (Fiorinal)  Tizanidine (Zanaflex)   **Note: The testing scope of this panel does not include the  following reported medications:   Anastrozole (Arimidex)  Caffeine (Fioricet)  Caffeine (Fiorinal)  Chlorthalidone (Hygroton)  Cholecalciferol  Cholestyramine (Questran)  Dicyclomine (Bentyl)  Ezetimibe (Zetia)  Hydrocortisone  Melatonin  Ondansetron (Zofran)  Potassium (Klor-Con)  Rabeprazole (Aciphex)  Sucralfate (Carafate)  Valacyclovir (Valtrex)  Vitamin B12 ==================================================================== For clinical consultation, please call [phone]. ====================================================================      ROS  Constitutional: Denies any fever or chills Gastrointestinal: No reported hemesis, hematochezia, vomiting, or acute GI distress Musculoskeletal: mid back pain Neurological: No reported episodes of acute onset apraxia, aphasia, dysarthria,  agnosia, amnesia, paralysis, loss of coordination, or loss of consciousness  Medication Review  Cholecalciferol, Digestive Aids Mixture, Glutathione, HYDROcodone-acetaminophen, LORazepam, Loperamide HCl, Melatonin, NONFORMULARY OR COMPOUNDED ITEM, RABEprazole, Testosterone Cypionate, Vitamin B-12, amitriptyline, anastrozole, butalbital-acetaminophen-caffeine, chlorthalidone, cholestyramine, dicyclomine, escitalopram, ezetimibe, labetalol, mirabegron ER, ondansetron, prochlorperazine, sucralfate, testosterone cypionate, tizanidine, and valACYclovir  History Review  Allergy: [name] is allergic to ace inhibitors, fluoxetine, metoclopramide, nalbuphine, other, amoxicillin-pot clavulanate, doxazosin, duloxetine, penicillins, tamsulosin, trazodone and nefazodone, amlodipine, cinoxacin, ciprofloxacin, nebivolol, olanzapine, olmesartan, pregabalin, prostaglandins, thyroid hormones, zolpidem, duloxetine hcl, fluoxetine hcl, and phenytoin. Drug: [name]  reports previous drug use. Alcohol:  reports current alcohol use. Tobacco:  reports that he has never smoked. He has never used smokeless tobacco. Social: [name]  reports that he has never smoked. He has never used smokeless tobacco. He reports current alcohol use. He reports previous drug use. Medical:  has a past medical history of Anxiety, Chronic back pain, Chronic heart disease, Depression, Headache, History of CT scan of brain (09/14/2018), History of depression, History of gastritis, History of headache, History of kidney stones, History of neuropathy, Hypertension, Insomnia, Kidney stone, Lumbar radicular pain, Lyme disease, Myocardial infarction [facility]), Overactive bladder, Skin cancer, basal cell, Sleep trouble, Squamous cell skin cancer, and Substance abuse ([place]). Surgical: Grant Young  has a past surgical history that includes Kidney stone surgery (09/14/1980); Kidney stone surgery (09/15/1995); Lithotripsy (09/14/2014); Lithotripsy (09/14/1996);  Lithotripsy (09/14/1997); Gallbladder surgery (09/15/2015); Sigmoidoscopy (09/14/2017); Colonoscopy (09/15/2015); Cholecystectomy (2017); Shoulder surgery (Right, 1984); Back surgery; Spinal cord stimulator implant; Pain pump implantation; Pain pump removal; Tonsillectomy; Foot surgery (Bilateral, 03/18/2020); Foot surgery (08/032021); Upper gi endoscopy; Cardiac catheterization; Colonoscopy with propofol (N/A, 10/03/2020); and Esophagogastroduodenoscopy (egd) with propofol (N/A, 10/03/2020). Family: family history includes Anxiety disorder in his daughter; Dementia in his mother; Heart disease in his father; Heart failure in his father; Hypertension in his father; Kidney Stones in his daughter; OCD in his daughter; Stroke in his father and mother.  Laboratory Chemistry  Profile   Renal Lab Results  Component Value Date   BUN 1 (A) 05/20/2020   CREATININE 0.90 03/06/2020   GFRAA 101 05/20/2020   GFRNONAA 87 05/20/2020     Hepatic Lab Results  Component Value Date   AST 18 05/20/2020   ALT 24 05/20/2020   ALBUMIN 4.2 05/20/2020   ALKPHOS 149 (H) 09/16/2020     Electrolytes Lab Results  Component Value Date   NA 145 05/20/2020   K 3.2 (A) 05/20/2020   CL 102 05/20/2020   CALCIUM 9.8 05/20/2020     Bone Lab Results  Component Value Date   TESTOSTERONE 33.2 05/20/2020     Inflammation (CRP: Acute Phase) (ESR: Chronic Phase) No results found for: CRP, ESRSEDRATE, LATICACIDVEN     Note: Above Lab results reviewed.  Recent Imaging Review  DG PAIN CLINIC C-ARM 1-60 MIN NO REPORT Fluoro was used, but no Radiologist interpretation will be provided.  Please refer to "NOTES" tab for provider progress note. Note: Reviewed        Physical Exam  General appearance: Well nourished, well developed, and well hydrated. In no apparent acute distress Mental status: Alert, oriented x 3 (person, place, & time)       Respiratory: No evidence of acute respiratory distress Eyes: PERLA Vitals:  BP 128/90 (BP Location: Right Arm, Patient Position: Sitting, Cuff Size: Normal)   Pulse 79   Temp 98.6 F (37 C) (Oral)   Resp 16   Ht '5\' 9"'  (1.753 m)   Wt 142 lb (64.4 kg)   SpO2 100%   BMI 20.97 kg/m  BMI: Estimated body mass index is 20.97 kg/m as calculated from the following:   Height as of this encounter: '5\' 9"'  (1.753 m).   Weight as of this encounter: 142 lb (64.4 kg). Ideal: Ideal body weight: 70.7 kg (155 lb 13.8 oz)  Lumbar Spine Area Exam  Skin & Axial Inspection: Well healed scar from previous spine surgery detected IPG present from SCS Alignment: Scoliosis detected Functional ROM: Pain restricted ROM       Stability: No instability detected Muscle Tone/Strength: Functionally intact. No obvious neuro-muscular anomalies detected. Sensory (Neurological): Improved low back pain after lumbar facet blocks  Gait & Posture Assessment  Ambulation: Unassisted Gait: Relatively normal for age and body habitus Posture: WNL   Lower Extremity Exam    Side: Right lower extremity  Side: Left lower extremity  Stability: No instability observed          Stability: No instability observed          Skin & Extremity Inspection: Skin color, temperature, and hair growth are WNL. No peripheral edema or cyanosis. No masses, redness, swelling, asymmetry, or associated skin lesions. No contractures.  Skin & Extremity Inspection: Skin color, temperature, and hair growth are WNL. No peripheral edema or cyanosis. No masses, redness, swelling, asymmetry, or associated skin lesions. No contractures.  Functional ROM: Unrestricted ROM                  Functional ROM: Unrestricted ROM                  Muscle Tone/Strength: Functionally intact. No obvious neuro-muscular anomalies detected.  Muscle Tone/Strength: Functionally intact. No obvious neuro-muscular anomalies detected.  Sensory (Neurological): Unimpaired        Sensory (Neurological): Unimpaired        DTR: Patellar: deferred  today Achilles: deferred today Plantar: deferred today  DTR: Patellar: deferred today Achilles: deferred  today Plantar: deferred today  Palpation: No palpable anomalies  Palpation: No palpable anomalies    Assessment   Status Diagnosis  Controlled Controlled Controlled 1. Lumbar facet arthropathy   2. Lumbar spondylosis   3. Chronic pain syndrome      Updated Problems: Problem  Lumbar Spondylosis   Status post lumbar facet medial branch nerve blocks which were helpful for his low back pain.  Can repeat as needed.     Plan of Care  Grant Young has a current medication list which includes the following long-term medication(s): amitriptyline, chlorthalidone, cholestyramine, dicyclomine, escitalopram, ezetimibe, labetalol, mirabegron er, rabeprazole, sucralfate, testosterone cypionate, and testosterone cypionate.  Pharmacotherapy (Medications Ordered): Meds ordered this encounter  Medications  . HYDROcodone-acetaminophen (NORCO) 10-325 MG tablet    Sig: Take 1-2 tablets by mouth 2 (two) times daily as needed for severe pain.    Dispense:  60 tablet    Refill:  0  . HYDROcodone-acetaminophen (NORCO) 10-325 MG tablet    Sig: Take 1-2 tablets by mouth 2 (two) times daily as needed for severe pain.    Dispense:  60 tablet    Refill:  0   Orders:  Orders Placed This Encounter  Procedures  . LUMBAR FACET(MEDIAL BRANCH NERVE BLOCK) MBNB    Scheduling timeframe: (PRN procedure) Grant Young will call when needed. Clinical indication: Axial low back pain. Lumbosacral Spondylosis (M47.897).  Sedation: Usually done with sedation. (May be done without sedation if so desired by patient.) Requirements: NPO x 8 hrs.; Driver; Stop blood thinners. Interval: No sooner than two weeks for diagnostic or therapeutic. No sooner than every other month for palliative.    Standing Status:   Standing    Number of Occurrences:   5    Standing Expiration Date:   12/04/2021    Scheduling  Instructions:     Procedure: Lumbar facet block (AKA.: Lumbosacral medial branch nerve block)     Level: L3-4, L4-5, & L5-S1 Facets (L3, L4, L5,Medial Branch Nerves)     Laterality: Bilateral    Order Specific Question:   Where will this procedure be performed?    Answer:   [facility]   Follow-up plan:   Return in about 10 weeks (around 02/12/2021) for Medication Management, in person.     Status post Botox No. 1 on 01/01/2020 for migraine, lumbar TPI as well. SPG block 03/13/20. Bilateral GONB 04/01/20. 11/13/20 B/L L3,4,5 Facet blocks helpful repeat prn          Recent Visits Date Type Provider Dept  11/13/20 Procedure visit [name], MD [facility]  10/17/20 Office Visit [name], MD [facility]  09/17/20 Office Visit [name], MD [facility]  Showing recent visits within past 90 days and meeting all other requirements Today's Visits Date Type Provider Dept  12/04/20 Office Visit [name], MD [facility]  Showing today's visits and meeting all other requirements Future Appointments Date Type Provider Dept  02/06/21 Appointment [name], MD [facility]  Showing future appointments within next 90 days and meeting all other requirements  I discussed the assessment and treatment plan with the patient. The patient was provided an opportunity to ask questions and all were answered. The patient agreed with the plan and demonstrated an understanding of the instructions.  Patient advised to call back or seek an in-person evaluation if the symptoms or condition worsens.  Duration of encounter: 29mnutes.  Note by: BGillis Santa MD Date: 12/04/2020;  Time: 2:17 PM

## 2020-12-11 ENCOUNTER — Other Ambulatory Visit: Payer: Self-pay | Admitting: Nurse Practitioner

## 2020-12-11 ENCOUNTER — Other Ambulatory Visit: Payer: Self-pay | Admitting: Internal Medicine

## 2020-12-11 DIAGNOSIS — F419 Anxiety disorder, unspecified: Secondary | ICD-10-CM

## 2020-12-12 NOTE — Telephone Encounter (Signed)
Last filled 11/11/2020... please advise

## 2020-12-16 ENCOUNTER — Other Ambulatory Visit: Payer: Self-pay

## 2020-12-16 ENCOUNTER — Encounter: Payer: Self-pay | Admitting: Gastroenterology

## 2020-12-16 ENCOUNTER — Ambulatory Visit (INDEPENDENT_AMBULATORY_CARE_PROVIDER_SITE_OTHER): Payer: Medicare Other | Admitting: Gastroenterology

## 2020-12-16 VITALS — BP 133/81 | HR 74 | Ht 69.0 in | Wt 143.6 lb

## 2020-12-16 DIAGNOSIS — K641 Second degree hemorrhoids: Secondary | ICD-10-CM | POA: Diagnosis not present

## 2020-12-16 NOTE — Progress Notes (Signed)
Patient follow-ups today for banding of hemorrhoids  Summary of history :  Here for round 3 of hemorrhoidal banding not responding to conservative management.  Had had diarrhea likely secondary to bile salt related due to prior history of cholecystectomy.  First round:10/16/2020 LL column banded  Second round 11/11/2020: Right anterior column banded    Interval history  11/11/2020-12/16/2020  Since last visit no issues of diarrhea.  Takes Questran once a day.  No significant perianal itching.  No other rectal bleeding.  Feels much better    Digital rectal exam performed in the presence of a chaperone. External anal findings: Small skin tag Internal findings: No abnormalities, No masses, no blood on glove noticed.    PROCEDURE NOTE: The patient presents with symptomatic grade 2 hemorrhoids, unresponsive to maximal medical therapy, requesting rubber band ligation of his/her hemorrhoidal disease.  All risks, benefits and alternative forms of therapy were described and informed consent was obtained.  In the Left Lateral Decubitus position (if anoscopy is performed) anoscopic examination revealed grade 2 hemorrhoids in the RP position(s).   The decision was made to band the RP internal hemorrhoid, and the [place] was used to perform band ligation without complication.  Digital anorectal examination was then performed to assure proper positioning of the band, and to adjust the banded tissue as required.  The patient was discharged home without pain or other issues.  Dietary and behavioral recommendations were given and (if necessary - prescriptions were given), along with follow-up instructions.  The patient will return As needed as needed for follow-up and possible additional banding as required.  No complications were encountered and the patient tolerated the procedure well.   Plan:  1. Avoid constipation.  Commence on stool softeners if not already on  Follow-up: As  needed  Dr [name] MD,MRCP [facility]) Gastroenterology/Hepatology Pager: [phone]

## 2020-12-17 ENCOUNTER — Encounter: Payer: Self-pay | Admitting: Internal Medicine

## 2020-12-17 DIAGNOSIS — I10 Essential (primary) hypertension: Secondary | ICD-10-CM

## 2020-12-17 DIAGNOSIS — F419 Anxiety disorder, unspecified: Secondary | ICD-10-CM

## 2020-12-17 DIAGNOSIS — M545 Other chronic pain: Secondary | ICD-10-CM

## 2020-12-17 DIAGNOSIS — G8929 Other chronic pain: Secondary | ICD-10-CM

## 2020-12-18 ENCOUNTER — Ambulatory Visit: Payer: Medicare Other | Admitting: Urology

## 2020-12-24 ENCOUNTER — Ambulatory Visit: Payer: Medicare Other | Admitting: Nurse Practitioner

## 2020-12-30 ENCOUNTER — Ambulatory Visit: Payer: No Typology Code available for payment source | Admitting: Internal Medicine

## 2021-01-03 ENCOUNTER — Other Ambulatory Visit: Payer: Self-pay | Admitting: Nurse Practitioner

## 2021-01-03 DIAGNOSIS — F419 Anxiety disorder, unspecified: Secondary | ICD-10-CM

## 2021-01-08 ENCOUNTER — Encounter: Payer: Self-pay | Admitting: Ophthalmology

## 2021-01-08 ENCOUNTER — Other Ambulatory Visit: Payer: Self-pay

## 2021-01-08 ENCOUNTER — Other Ambulatory Visit: Payer: Self-pay | Admitting: *Deleted

## 2021-01-08 DIAGNOSIS — Z9682 Presence of neurostimulator: Secondary | ICD-10-CM | POA: Insufficient documentation

## 2021-01-08 DIAGNOSIS — F419 Anxiety disorder, unspecified: Secondary | ICD-10-CM

## 2021-01-08 MED ORDER — GEMTESA 75 MG PO TABS: 75.0000 mg | ORAL_TABLET | Freq: Every day | ORAL | 3 refills | Status: DC

## 2021-01-09 ENCOUNTER — Other Ambulatory Visit: Payer: Self-pay | Admitting: Gastroenterology

## 2021-01-09 MED ORDER — ESCITALOPRAM OXALATE 20 MG PO TABS: 20.0000 mg | ORAL_TABLET | Freq: Every day | ORAL | 1 refills | Status: DC

## 2021-01-10 NOTE — Discharge Instructions (Signed)

## 2021-01-12 ENCOUNTER — Other Ambulatory Visit: Payer: Self-pay | Admitting: Nurse Practitioner

## 2021-01-12 DIAGNOSIS — I10 Essential (primary) hypertension: Secondary | ICD-10-CM

## 2021-01-12 DIAGNOSIS — Z9841 Cataract extraction status, right eye: Secondary | ICD-10-CM

## 2021-01-12 HISTORY — DX: Cataract extraction status, left eye: Z98.41

## 2021-01-13 NOTE — Telephone Encounter (Signed)
Patient seen at [facility].  Pended Rx and sent to [facility] for approval.

## 2021-01-14 ENCOUNTER — Ambulatory Visit
Admission: RE | Admit: 2021-01-14 | Discharge: 2021-01-14 | Disposition: A | Discharge status: Home / Self Care | Payer: Medicare Other | Source: Ambulatory Visit | Attending: Ophthalmology | Admitting: Ophthalmology

## 2021-01-14 ENCOUNTER — Other Ambulatory Visit: Payer: Self-pay

## 2021-01-14 ENCOUNTER — Ambulatory Visit: Payer: Medicare Other | Admitting: Anesthesiology

## 2021-01-14 ENCOUNTER — Encounter: Payer: Self-pay | Admitting: Ophthalmology

## 2021-01-14 ENCOUNTER — Encounter
Admission: RE | Disposition: A | Discharge status: Home / Self Care | Payer: Self-pay | Source: Ambulatory Visit | Attending: Ophthalmology

## 2021-01-14 DIAGNOSIS — Z888 Allergy status to other drugs, medicaments and biological substances status: Secondary | ICD-10-CM | POA: Diagnosis not present

## 2021-01-14 DIAGNOSIS — H2512 Age-related nuclear cataract, left eye: Secondary | ICD-10-CM | POA: Diagnosis present

## 2021-01-14 DIAGNOSIS — Z79899 Other long term (current) drug therapy: Secondary | ICD-10-CM | POA: Insufficient documentation

## 2021-01-14 DIAGNOSIS — Z881 Allergy status to other antibiotic agents status: Secondary | ICD-10-CM | POA: Insufficient documentation

## 2021-01-14 HISTORY — DX: Gastro-esophageal reflux disease without esophagitis: K21.9

## 2021-01-14 HISTORY — DX: Nausea with vomiting, unspecified: R11.2

## 2021-01-14 HISTORY — DX: Presence of other specified functional implants: Z96.89

## 2021-01-14 HISTORY — DX: Other specified postprocedural states: Z98.890

## 2021-01-14 HISTORY — PX: CATARACT EXTRACTION W/PHACO: SHX586

## 2021-01-14 SURGERY — PHACOEMULSIFICATION, CATARACT, WITH IOL INSERTION
Anesthesia: Monitor Anesthesia Care | Site: Eye | Laterality: Left

## 2021-01-14 MED ORDER — ACETAMINOPHEN 160 MG/5ML PO SOLN
325.0000 mg | ORAL | Status: DC | PRN
Start: 2021-01-14 — End: 2021-01-14

## 2021-01-14 MED ORDER — ARMC OPHTHALMIC DILATING DROPS
1.0000 "application " | OPHTHALMIC | Status: DC | PRN
  Administered 2021-01-14 (×3): 1 via OPHTHALMIC

## 2021-01-14 MED ORDER — CHLORTHALIDONE 25 MG PO TABS: 25.0000 mg | ORAL_TABLET | Freq: Every day | ORAL | 0 refills | Status: DC

## 2021-01-14 MED ORDER — LORAZEPAM 0.5 MG PO TABS: 0.5000 mg | ORAL_TABLET | Freq: Two times a day (BID) | ORAL | 0 refills | Status: DC

## 2021-01-14 MED ORDER — FENTANYL CITRATE (PF) 100 MCG/2ML IJ SOLN
INTRAMUSCULAR | Status: DC | PRN
  Administered 2021-01-14 (×2): 50 ug via INTRAVENOUS

## 2021-01-14 MED ORDER — TIZANIDINE HCL 2 MG PO CAPS: 2.0000 mg | ORAL_CAPSULE | Freq: Two times a day (BID) | ORAL | 0 refills | Status: DC | PRN

## 2021-01-14 MED ORDER — MIDAZOLAM HCL 2 MG/2ML IJ SOLN
INTRAMUSCULAR | Status: DC | PRN
  Administered 2021-01-14 (×2): 1 mg via INTRAVENOUS

## 2021-01-14 MED ORDER — BRIMONIDINE TARTRATE-TIMOLOL 0.2-0.5 % OP SOLN
OPHTHALMIC | Status: DC | PRN
  Administered 2021-01-14: 1 [drp] via OPHTHALMIC

## 2021-01-14 MED ORDER — LIDOCAINE HCL (PF) 2 % IJ SOLN
INTRAOCULAR | Status: DC | PRN
  Administered 2021-01-14: 1 mL

## 2021-01-14 MED ORDER — NA CHONDROIT SULF-NA HYALURON 40-17 MG/ML IO SOLN
INTRAOCULAR | Status: DC | PRN
  Administered 2021-01-14: 1 mL via INTRAOCULAR

## 2021-01-14 MED ORDER — ONDANSETRON HCL 4 MG/2ML IJ SOLN
4.0000 mg | Freq: Once | INTRAMUSCULAR | Status: DC | PRN
Start: 2021-01-14 — End: 2021-01-14

## 2021-01-14 MED ORDER — EPINEPHRINE PF 1 MG/ML IJ SOLN
INTRAOCULAR | Status: DC | PRN
  Administered 2021-01-14: 79 mL via OPHTHALMIC

## 2021-01-14 MED ORDER — MOXIFLOXACIN HCL 0.5 % OP SOLN
OPHTHALMIC | Status: DC | PRN
  Administered 2021-01-14: 0.2 mL via OPHTHALMIC

## 2021-01-14 MED ORDER — ACETAMINOPHEN 325 MG PO TABS: 325.0000 mg | ORAL_TABLET | ORAL | Status: DC | PRN

## 2021-01-14 MED ORDER — TETRACAINE HCL 0.5 % OP SOLN
1.0000 [drp] | OPHTHALMIC | Status: DC | PRN
  Administered 2021-01-14 (×3): 1 [drp] via OPHTHALMIC

## 2021-01-14 SURGICAL SUPPLY — 17 items
CANNULA ANT/CHMB 27GA (MISCELLANEOUS) ×4 IMPLANT
GLOVE SURG TRIUMPH 8.0 PF LTX (GLOVE) ×2 IMPLANT
GOWN STRL REUS W/ TWL LRG LVL3 (GOWN DISPOSABLE) ×2 IMPLANT
GOWN STRL REUS W/TWL LRG LVL3 (GOWN DISPOSABLE) ×4
LENS IOL ACRSF VT TRC 315 24.5 ×1 IMPLANT
LENS IOL ACRYSOF VIVITY 24.5 ×2 IMPLANT
LENS IOL VIVITY 315 24.5 ×1 IMPLANT
MARKER SKIN DUAL TIP RULER LAB (MISCELLANEOUS) ×2 IMPLANT
NEEDLE FILTER BLUNT 18X 1/2SAF (NEEDLE) ×1
NEEDLE FILTER BLUNT 18X1 1/2 (NEEDLE) ×1 IMPLANT
PACK EYE AFTER SURG (MISCELLANEOUS) ×2 IMPLANT
PACK OPTHALMIC (MISCELLANEOUS) ×2 IMPLANT
PACK PORFILIO (MISCELLANEOUS) ×2 IMPLANT
SYR 3ML LL SCALE MARK (SYRINGE) ×2 IMPLANT
SYR TB 1ML LUER SLIP (SYRINGE) ×2 IMPLANT
WATER STERILE IRR 250ML POUR (IV SOLUTION) ×2 IMPLANT
WIPE NON LINTING 3.25X3.25 (MISCELLANEOUS) ×2 IMPLANT

## 2021-01-14 NOTE — Anesthesia Postprocedure Evaluation (Signed)
Anesthesia Post Note  Patient: Grant Young  Procedure(s) Performed: CATARACT EXTRACTION PHACO AND INTRAOCULAR LENS PLACEMENT (IOC) LEFT VIVITY TORIC LENS 8.75 00:56.7 (Left Eye)     Patient location during evaluation: PACU Anesthesia Type: MAC Level of consciousness: awake Pain management: pain level controlled Vital Signs Assessment: post-procedure vital signs reviewed and stable Respiratory status: respiratory function stable Cardiovascular status: stable Postop Assessment: no apparent nausea or vomiting Anesthetic complications: no   No complications documented.  Veda Canning

## 2021-01-14 NOTE — H&P (Signed)
[facility]   Primary Care Physician:  [name], NP Ophthalmologist: Dr. George Ina  Pre-Procedure History & Physical: HPI:  Grant Young is a 75 y.o. male here for cataract surgery.   Past Medical History:  Diagnosis Date  . Anxiety    Per New Patient Packet  . Chronic back pain    Per New Patient Packet  . Chronic heart disease    Per New Patient Packet  . Depression   . GERD (gastroesophageal reflux disease)   . Headache   . History of CT scan of brain 09/14/2018   Per New Patient Packet  . History of depression    Per New Patient Packet  . History of gastritis    Per New Patient Packet  . History of headache    Per New Patient Packet  . History of kidney stones    Per New Patient Packet  . History of neuropathy    Per New Patient Packet  . Hypertension    Per New Patient Packet  . Insomnia   . Kidney stone   . Lumbar radicular pain    Per New Patient Packet  . Lyme disease    Per New Patient Packet  . Myocardial infarction ([place])   . Overactive bladder   . PONV (postoperative nausea and vomiting)   . Skin cancer, basal cell   . Sleep trouble    Per New Patient Packet  . Spinal cord stimulator status    01/08/21 - not currently using.  Marland Kitchen Squamous cell skin cancer   . Substance abuse [facility])     Past Surgical History:  Procedure Laterality Date  . BACK SURGERY    . CARDIAC CATHETERIZATION    . CHOLECYSTECTOMY  2017  . COLONOSCOPY  09/15/2015   Per New Patient Packet  . COLONOSCOPY WITH PROPOFOL N/A 10/03/2020   Procedure: COLONOSCOPY WITH PROPOFOL;  Surgeon: [name], MD;  Location: [facility] ENDOSCOPY;  Service: Gastroenterology;  Laterality: N/A;  . ESOPHAGOGASTRODUODENOSCOPY (EGD) WITH PROPOFOL N/A 10/03/2020   Procedure: ESOPHAGOGASTRODUODENOSCOPY (EGD) WITH PROPOFOL;  Surgeon: [name], MD;  Location: [facility] ENDOSCOPY;  Service: Gastroenterology;  Laterality: N/A;  . FOOT SURGERY Bilateral 03/18/2020  . FOOT SURGERY  08/032021  .  GALLBLADDER SURGERY  09/15/2015   Gallbladder Removal. Procedure done by Dr.Beverly. Per New Patient Packet  . KIDNEY STONE SURGERY  09/14/1980   Too many to count. Per New Patient Packet 09/14/1980-09/15/1995  . KIDNEY STONE SURGERY  09/15/1995   Too many to count. Per New Patient Packet  . LITHOTRIPSY  09/14/2014   Per New Patient Packet  . LITHOTRIPSY  09/14/1996   No Stints Used. Per New Patient Packet  . LITHOTRIPSY  09/14/1997   No Stints used. Per New Patient Packet  . PAIN PUMP IMPLANTATION    . PAIN PUMP REMOVAL    . SHOULDER SURGERY Right 1984  . SIGMOIDOSCOPY  09/14/2017   Per New Patient Packet  . SPINAL CORD STIMULATOR IMPLANT    . TONSILLECTOMY    . UPPER GI ENDOSCOPY      Prior to Admission medications   Medication Sig Start Date End Date Taking? Authorizing Provider  amitriptyline (ELAVIL) 10 MG tablet Take 20 mg by mouth at bedtime.    Yes [provider]  butalbital-acetaminophen-caffeine (FIORICET) 50-325-40 MG tablet Take by mouth. 09/13/20  Yes [provider]  chlorthalidone (HYGROTON) 25 MG tablet Take 1 tablet (25 mg total) by mouth daily. 01/14/21  Yes [name],  NP  Cholecalciferol 125 MCG (5000 UT) TABS Take by mouth. 04/15/20  Yes [provider]  cholestyramine (QUESTRAN) 4 g packet Take 1 packet (4 g total) by mouth 3 (three) times daily. 10/16/20 01/14/21 Yes [name], MD  Cyanocobalamin (VITAMIN B-12) 5000 MCG LOZG Take 1 lozenge by mouth daily.    Yes [provider]  Digestive Aids Mixture (DIGESTION GB PO) Take 1 capsule by mouth daily.    Yes [provider]  escitalopram (LEXAPRO) 20 MG tablet Take 1 tablet (20 mg total) by mouth daily. 01/09/21  Yes [name], NP  ezetimibe (ZETIA) 10 MG tablet Take 1 tablet (10 mg total) by mouth daily. 06/04/20  Yes [name], NP  GLUTATHIONE PO Take 1 tablet by mouth daily.   Yes [provider]  HYDROcodone-acetaminophen (NORCO) 10-325 MG  tablet Take 1-2 tablets by mouth 2 (two) times daily as needed for severe pain. 01/08/21 02/07/21 Yes [name], MD  Hyoscyamine Sulfate (HYOSCYAMINE PO) Take by mouth as needed.   Yes [provider]  Loperamide HCl (IMODIUM PO) Take by mouth as needed.    Yes [provider]  LORazepam (ATIVAN) 0.5 MG tablet Take 1 tablet (0.5 mg total) by mouth 2 (two) times daily. 01/14/21  Yes [name], NP  Melatonin 10 MG TABS Take 10 mg by mouth at bedtime.    Yes [provider]  ondansetron (ZOFRAN) 4 MG tablet Take 4 mg by mouth as needed for nausea or vomiting.    Yes [provider]  OVER THE COUNTER MEDICATION G I Detox  - 2x/week Gastromend HP Daily Glutaloemine Daily Lithium Oronate Daily GABA Liposomal Daily Fungus Eliminator   Yes [provider]  PASSION FLOWER-VALERIAN PO Take by mouth.   Yes [provider]  Potassium 99 MG TABS Take by mouth 3 (three) times a week. K-Zyme   Yes [provider]  Probiotic Product (PROBIOTIC DAILY PO) Take by mouth.   Yes [provider]  prochlorperazine (COMPAZINE) 10 MG tablet Take 10 mg by mouth as needed. 07/01/20  Yes [provider]  propranolol (INDERAL) 40 MG tablet Take 40 mg by mouth 2 (two) times daily.   Yes [provider]  RABEprazole (ACIPHEX) 20 MG tablet TAKE 1 TABLET BY MOUTH TWICE A DAY 07/22/20  Yes [name], MD  Rhodiola rosea (RHODIOLA PO) Take by mouth.   Yes [provider]  sucralfate (CARAFATE) 1 g tablet Take 1 g by mouth 4 (four) times daily as needed.    Yes [provider]  testosterone cypionate (DEPOTESTOSTERONE CYPIONATE) 200 MG/ML injection Inject 60 mg into the muscle every 14 (fourteen) days. 11/27/20  Yes [provider]  Theanine 50 MG TBDP Take by mouth at bedtime.   Yes [provider]  tizanidine (ZANAFLEX) 2 MG capsule Take 1 capsule (2 mg total) by mouth every 12 (twelve) hours as  needed. 01/14/21  Yes [name], [name], NP  Vibegron (GEMTESA) 75 MG TABS Take 75 mg by mouth daily. 01/08/21  Yes Vaillancourt, Samantha, PA-C  anastrozole (ARIMIDEX) 1 MG tablet Take by mouth. Patient not taking: Reported on 01/08/2021 11/28/20   [provider]  dicyclomine (BENTYL) 20 MG tablet Take 20 mg by mouth 4 (four) times daily. Patient not taking: Reported on 01/08/2021 07/20/20   [provider]  NONFORMULARY OR COMPOUNDED ITEM Bi-Mix Injection  Papaverine HCI/Phentolamine Mesylate 150mg /55mg /Vial Inject 66ml prior to intercourse   For intracavernosal use  only 06/19/20   Billey Co, MD  valACYclovir (VALTREX) 1000 MG tablet TAKE 2 TABLETS BY MOUTH AND REPEAT IN 12 HOURS FOR COLD SORE 10/08/20   [name], NP    Allergies as of 11/28/2020 - Review Complete 11/28/2020  Allergen Reaction Noted  . Ace inhibitors  08/24/2019  . Fluoxetine Anxiety 10/31/2019  . Metoclopramide  10/31/2019  . Nalbuphine  10/31/2019  . Other  01/03/2014  . Amoxicillin-pot clavulanate Nausea Only 10/31/2019  . Doxazosin Rash 02/01/2013  . Duloxetine Nausea Only 10/31/2019  . Penicillins Nausea Only 10/31/2019  . Tamsulosin Itching and Anxiety 02/01/2013  . Trazodone and nefazodone Itching, Anxiety, and Rash 02/13/2012  . Amlodipine  10/31/2019  . Cinoxacin  10/31/2019  . Ciprofloxacin  08/24/2019  . Nebivolol  08/24/2019  . Olanzapine  10/31/2019  . Olmesartan  08/24/2019  . Pregabalin  10/31/2019  . Prostaglandins  05/07/2014  . Thyroid hormones  06/09/2018  . Zolpidem  10/31/2019  . Duloxetine hcl  01/03/2014  . Fluoxetine hcl  01/03/2014  . Phenytoin Anxiety 10/31/2019    Family History  Problem Relation Age of Onset  . Heart disease Father   . Heart failure Father   . Hypertension Father   . Stroke Father   . Stroke Mother   . Dementia Mother   . Kidney Stones Daughter   . Anxiety disorder Daughter   . OCD Daughter     Social History    Socioeconomic History  . Marital status: Married    Spouse name: Not on file  . Number of children: Not on file  . Years of education: Not on file  . Highest education level: Not on file  Occupational History  . Not on file  Tobacco Use  . Smoking status: Never Smoker  . Smokeless tobacco: Never Used  Vaping Use  . Vaping Use: Never used  Substance and Sexual Activity  . Alcohol use: Yes    Comment: 1 Drink a Month.  . Drug use: Not Currently  . Sexual activity: Yes    Birth control/protection: None  Other Topics Concern  . Not on file  Social History Narrative   Tobacco use, amount per day now: None   Past tobacco use, amount per day: None   How many years did you use tobacco: 0   Alcohol use (drinks per week): 0-1 Month   Diet:   Do you drink/eat things with caffeine: Occasionally ( Hot Chocolate and maybe 1/4 of 16oz Pepsi 2-3 times a week.   Marital status: Married                                  What year were you married? 1970   Do you live in a house, apartment, assisted living, condo, trailer, etc.? House   Is it one or more stories? One   How many persons live in your home? 2   Do you have pets in your home?( please list)  No   Highest Level of education completed: Masters   Current or past profession: Intensive Transport planner for [employer]   Do you exercise? Yes                                    Type and how often? [place], Recumbent Bike, 3 times a week. Try  to get a 1.2 mile walk at least 3 times a week or more.     Do you have a living will? Yes   Do you have a DNR form?  No                                 If not, do you want to discuss one?   Do you have signed POA/HPOA forms? Yes                       If so, please bring to you appointment    Do you have any difficulty bathing or dressing yourself? No    Do you have difficulty preparing food or eating? No   Do you have difficulty managing your medications? No   Do you have any  difficulty managing your finances? No   Do you have any difficulty affording your medications? No         Social Determinants of Radio broadcast assistant Strain: Not on file  Food Insecurity: Not on file  Transportation Needs: Not on file  Physical Activity: Not on file  Stress: Not on file  Social Connections: Not on file  Intimate Partner Violence: Not on file    Review of Systems: See HPI, otherwise negative ROS  Physical Exam: BP (!) 154/88   Pulse (!) 55   Temp (!) 97.1 F (36.2 C) (Temporal)   Ht 5\' 9"  (1.753 m)   Wt 63.5 kg   SpO2 98%   BMI 20.67 kg/m  General:   Alert,  pleasant and cooperative in NAD Head:  Normocephalic and atraumatic. Respiratory:  Normal work of breathing. Cardiovascular:  RRR  Impression/Plan: Grant Young is here for cataract surgery.  Risks, benefits, limitations, and alternatives regarding cataract surgery have been reviewed with the patient.  Questions have been answered.  All parties agreeable.   Birder Robson, MD  01/14/2021, 9:31 AM

## 2021-01-14 NOTE — Anesthesia Preprocedure Evaluation (Signed)
Anesthesia Evaluation  Patient identified by MRN, date of birth, ID band Patient awake    Reviewed: Allergy & Precautions, NPO status   History of Anesthesia Complications (+) PONV  Airway Mallampati: II  TM Distance: >3 FB     Dental   Pulmonary    Pulmonary exam normal        Cardiovascular hypertension, + Past MI   Rhythm:Regular Rate:Normal  TTE 0/03/49: Normal systolic function. Impaired relaxation, mild aortic root dilatation.   Neuro/Psych  Headaches (chronic intractable migraines), PSYCHIATRIC DISORDERS Anxiety Depression    GI/Hepatic GERD  ,  Endo/Other    Renal/GU      Musculoskeletal  (+) Arthritis , Hx spinal cord stimulator - currently not using   Abdominal   Peds  Hematology   Anesthesia Other Findings   Reproductive/Obstetrics                             Anesthesia Physical Anesthesia Plan  ASA: III  Anesthesia Plan: MAC   Post-op Pain Management:    Induction: Intravenous  PONV Risk Score and Plan: TIVA, Midazolam and Treatment may vary due to age or medical condition  Airway Management Planned: Natural Airway and Nasal Cannula  Additional Equipment:   Intra-op Plan:   Post-operative Plan:   Informed Consent: I have reviewed the patients History and Physical, chart, labs and discussed the procedure including the risks, benefits and alternatives for the proposed anesthesia with the patient or authorized representative who has indicated his/her understanding and acceptance.       Plan Discussed with: CRNA  Anesthesia Plan Comments:         Anesthesia Quick Evaluation

## 2021-01-14 NOTE — Op Note (Signed)
PREOPERATIVE DIAGNOSIS:  Nuclear sclerotic cataract of the left eye.   POSTOPERATIVE DIAGNOSIS:  Nuclear sclerotic cataract of the left eye.   OPERATIVE PROCEDURE: Procedure(s): CATARACT EXTRACTION PHACO AND INTRAOCULAR LENS PLACEMENT (IOC) LEFT VIVITY TORIC LENS 8.75 00:56.7   SURGEON:  Birder Robson, MD.   ANESTHESIA: 1.      Managed anesthesia care. 2.     0.32ml os Shugarcaine was instilled following the paracentesis [email or web address]   COMPLICATIONS:  None.   TECHNIQUE:   Stop and chop    DESCRIPTION OF PROCEDURE:  The patient was examined and consented in the preoperative holding area where the aforementioned topical anesthesia was applied to the left eye.  The patient was brought back to the Operating Room where he was sat upright on the gurney and given a target to fixate upon while the eye was marked at the 3:00 and 9:00 position.  The patient was then reclined on the operating table.  The eye was prepped and draped in the usual sterile ophthalmic fashion and a lid speculum was placed. A paracentesis was created with the side port blade and the anterior chamber was filled with viscoelastic. A near clear corneal incision was performed with the steel keratome. A continuous curvilinear capsulorrhexis was performed with a cystotome followed by the capsulorrhexis forceps. Hydrodissection and hydrodelineation were carried out with BSS on a blunt cannula. The lens was removed in a stop and chop technique and the remaining cortical material was removed with the irrigation-aspiration handpiece. The eye was inflated with viscoelastic and the DFT315  lens was placed in the eye and rotated to within a few degrees of the predetermined orientation.  The remaining viscoelastic was removed from the eye.  The Sinskey hook was used to rotate the toric lens into its final resting place at 179 degrees.  0.1 ml of Vigamox was placed in the anterior chamber. The eye was inflated to a physiologic pressure and  found to be watertight.  The eye was dressed with Vigamox. The patient was given protective glasses to wear throughout the day and a shield with which to sleep tonight. The patient was also given drops with which to begin a drop regimen today and will follow-up with me in one day. Implant Name Type Inv. Item Serial No. Manufacturer Lot No. LRB No. Used Action  LENS IOL ACRYSOF VIVITY 24.5 - [id number]  LENS IOL ACRYSOF VIVITY 24.5 [id number] ALCON  Left 1 Implanted   Procedure(s): CATARACT EXTRACTION PHACO AND INTRAOCULAR LENS PLACEMENT (IOC) LEFT VIVITY TORIC LENS 8.75 00:56.7 (Left)  Electronically signed: Birder Robson 5/3/202210:03 AM

## 2021-01-14 NOTE — Transfer of Care (Signed)
Immediate Anesthesia Transfer of Care Note  Patient: Grant Young  Procedure(s) Performed: CATARACT EXTRACTION PHACO AND INTRAOCULAR LENS PLACEMENT (IOC) LEFT VIVITY TORIC LENS 8.75 00:56.7 (Left Eye)  Patient Location: PACU  Anesthesia Type: MAC  Level of Consciousness: awake, alert  and patient cooperative  Airway and Oxygen Therapy: Patient Spontanous Breathing  Post-op Assessment: Post-op Vital signs reviewed, Patient's Cardiovascular Status Stable, Respiratory Function Stable, Patent Airway and No signs of Nausea or vomiting  Post-op Vital Signs: Reviewed and stable  Complications: No complications documented.

## 2021-01-14 NOTE — Anesthesia Procedure Notes (Signed)
Procedure Name: MAC Date/Time: 01/14/2021 9:43 AM Performed by: Vanetta Shawl, CRNA Pre-anesthesia Checklist: Patient identified, Emergency Drugs available, Suction available, Timeout performed and Patient being monitored Patient Re-evaluated:Patient Re-evaluated prior to induction Oxygen Delivery Method: Nasal cannula Placement Confirmation: positive ETCO2

## 2021-01-15 ENCOUNTER — Encounter: Payer: Self-pay | Admitting: Ophthalmology

## 2021-01-20 ENCOUNTER — Encounter: Payer: Self-pay | Admitting: Ophthalmology

## 2021-01-27 NOTE — Discharge Instructions (Signed)

## 2021-01-28 ENCOUNTER — Encounter
Admission: RE | Disposition: A | Discharge status: Home / Self Care | Payer: Self-pay | Source: Ambulatory Visit | Attending: Ophthalmology

## 2021-01-28 ENCOUNTER — Ambulatory Visit: Payer: Medicare Other | Admitting: Anesthesiology

## 2021-01-28 ENCOUNTER — Other Ambulatory Visit: Payer: Self-pay

## 2021-01-28 ENCOUNTER — Encounter: Payer: Self-pay | Admitting: Ophthalmology

## 2021-01-28 ENCOUNTER — Ambulatory Visit
Admission: RE | Admit: 2021-01-28 | Discharge: 2021-01-28 | Disposition: A | Discharge status: Home / Self Care | Payer: Medicare Other | Source: Ambulatory Visit | Attending: Ophthalmology | Admitting: Ophthalmology

## 2021-01-28 DIAGNOSIS — H2511 Age-related nuclear cataract, right eye: Secondary | ICD-10-CM | POA: Diagnosis present

## 2021-01-28 DIAGNOSIS — Z85828 Personal history of other malignant neoplasm of skin: Secondary | ICD-10-CM | POA: Diagnosis not present

## 2021-01-28 DIAGNOSIS — Z961 Presence of intraocular lens: Secondary | ICD-10-CM | POA: Diagnosis not present

## 2021-01-28 DIAGNOSIS — Z888 Allergy status to other drugs, medicaments and biological substances status: Secondary | ICD-10-CM | POA: Insufficient documentation

## 2021-01-28 DIAGNOSIS — Z79899 Other long term (current) drug therapy: Secondary | ICD-10-CM | POA: Insufficient documentation

## 2021-01-28 DIAGNOSIS — Z9842 Cataract extraction status, left eye: Secondary | ICD-10-CM | POA: Diagnosis not present

## 2021-01-28 DIAGNOSIS — Z880 Allergy status to penicillin: Secondary | ICD-10-CM | POA: Diagnosis not present

## 2021-01-28 DIAGNOSIS — Z881 Allergy status to other antibiotic agents status: Secondary | ICD-10-CM | POA: Insufficient documentation

## 2021-01-28 HISTORY — PX: CATARACT EXTRACTION W/PHACO: SHX586

## 2021-01-28 SURGERY — PHACOEMULSIFICATION, CATARACT, WITH IOL INSERTION
Anesthesia: Monitor Anesthesia Care | Site: Eye | Laterality: Right

## 2021-01-28 MED ORDER — LIDOCAINE HCL (PF) 2 % IJ SOLN
INTRAOCULAR | Status: DC | PRN
  Administered 2021-01-28: 1 mL via INTRAMUSCULAR

## 2021-01-28 MED ORDER — MOXIFLOXACIN HCL 0.5 % OP SOLN
OPHTHALMIC | Status: DC | PRN
  Administered 2021-01-28: 0.2 mL via OPHTHALMIC

## 2021-01-28 MED ORDER — ARMC OPHTHALMIC DILATING DROPS
1.0000 "application " | OPHTHALMIC | Status: DC | PRN
  Administered 2021-01-28 (×3): 1 via OPHTHALMIC

## 2021-01-28 MED ORDER — LACTATED RINGERS IV SOLN: INTRAVENOUS | Status: DC

## 2021-01-28 MED ORDER — MIDAZOLAM HCL 2 MG/2ML IJ SOLN
INTRAMUSCULAR | Status: DC | PRN
  Administered 2021-01-28: 2 mg via INTRAVENOUS

## 2021-01-28 MED ORDER — ACETAMINOPHEN 325 MG PO TABS: 325.0000 mg | ORAL_TABLET | ORAL | Status: DC | PRN

## 2021-01-28 MED ORDER — ACETAMINOPHEN 160 MG/5ML PO SOLN: 325.0000 mg | ORAL | Status: DC | PRN

## 2021-01-28 MED ORDER — BRIMONIDINE TARTRATE-TIMOLOL 0.2-0.5 % OP SOLN
OPHTHALMIC | Status: DC | PRN
  Administered 2021-01-28: 1 [drp] via OPHTHALMIC

## 2021-01-28 MED ORDER — NA CHONDROIT SULF-NA HYALURON 40-17 MG/ML IO SOLN
INTRAOCULAR | Status: DC | PRN
  Administered 2021-01-28: 1 mL via INTRAOCULAR

## 2021-01-28 MED ORDER — FENTANYL CITRATE (PF) 100 MCG/2ML IJ SOLN
INTRAMUSCULAR | Status: DC | PRN
  Administered 2021-01-28: 100 ug via INTRAVENOUS

## 2021-01-28 MED ORDER — EPINEPHRINE PF 1 MG/ML IJ SOLN
INTRAOCULAR | Status: DC | PRN
  Administered 2021-01-28: 52 mL via OPHTHALMIC

## 2021-01-28 MED ORDER — TETRACAINE HCL 0.5 % OP SOLN
1.0000 [drp] | OPHTHALMIC | Status: DC | PRN
  Administered 2021-01-28 (×3): 1 [drp] via OPHTHALMIC

## 2021-01-28 SURGICAL SUPPLY — 18 items
CANNULA ANT/CHMB 27GA (MISCELLANEOUS) ×4 IMPLANT
GLOVE BIOGEL M 8.0 STRL (GLOVE) ×2 IMPLANT
GLOVE SURG TRIUMPH 8.0 PF LTX (GLOVE) ×2 IMPLANT
GOWN STRL REUS W/ TWL LRG LVL3 (GOWN DISPOSABLE) ×2 IMPLANT
GOWN STRL REUS W/TWL LRG LVL3 (GOWN DISPOSABLE) ×4
LENS IOL ACRSF VT TRC 315 23.5 ×1 IMPLANT
LENS IOL ACRYSOF VIVITY 23.5 ×2 IMPLANT
LENS IOL VIVITY 315 23.5 ×1 IMPLANT
MARKER SKIN DUAL TIP RULER LAB (MISCELLANEOUS) ×2 IMPLANT
NEEDLE FILTER BLUNT 18X 1/2SAF (NEEDLE) ×1
NEEDLE FILTER BLUNT 18X1 1/2 (NEEDLE) ×1 IMPLANT
PACK EYE AFTER SURG (MISCELLANEOUS) ×2 IMPLANT
PACK OPTHALMIC (MISCELLANEOUS) ×2 IMPLANT
PACK PORFILIO (MISCELLANEOUS) ×2 IMPLANT
SYR 3ML LL SCALE MARK (SYRINGE) ×2 IMPLANT
SYR TB 1ML LUER SLIP (SYRINGE) ×2 IMPLANT
WATER STERILE IRR 250ML POUR (IV SOLUTION) ×2 IMPLANT
WIPE NON LINTING 3.25X3.25 (MISCELLANEOUS) ×2 IMPLANT

## 2021-01-28 NOTE — Anesthesia Procedure Notes (Signed)
Procedure Name: MAC Date/Time: 01/28/2021 10:02 AM Performed by: Silvana Newness, CRNA Pre-anesthesia Checklist: Patient identified, Emergency Drugs available, Suction available, Patient being monitored and Timeout performed Patient Re-evaluated:Patient Re-evaluated prior to induction Oxygen Delivery Method: Nasal cannula Placement Confirmation: positive ETCO2

## 2021-01-28 NOTE — Op Note (Signed)
PREOPERATIVE DIAGNOSIS:  Nuclear sclerotic cataract of the right eye.   POSTOPERATIVE DIAGNOSIS:  Nuclear sclerotic cataract of the right eye.   OPERATIVE PROCEDURE: Procedure(s): CATARACT EXTRACTION PHACO AND INTRAOCULAR LENS PLACEMENT (IOC) RIGHT VIVITY TORIC LENS   SURGEON:  Birder Robson, MD.   ANESTHESIA: 1.      Managed anesthesia care. 2.     0.58ml of Shugarcaine was instilled following the paracentesis  Anesthesiologist: Rochel Brome, MD CRNA: Silvana Newness, CRNA  COMPLICATIONS:  None.   TECHNIQUE:   Stop and chop    DESCRIPTION OF PROCEDURE:  The patient was examined and consented in the preoperative holding area where the aforementioned topical anesthesia was applied to the right eye.  The patient was brought back to the Operating Room where he was sat upright on the gurney and given a target to fixate upon while the eye was marked at the 3:00 and 9:00 position.  The patient was then reclined on the operating table.  The eye was prepped and draped in the usual sterile ophthalmic fashion and a lid speculum was placed. A paracentesis was created with the side port blade and the anterior chamber was filled with viscoelastic. A near clear corneal incision was performed with the steel keratome. A continuous curvilinear capsulorrhexis was performed with a cystotome followed by the capsulorrhexis forceps. Hydrodissection and hydrodelineation were carried out with BSS on a blunt cannula. The lens was removed in a stop and chop technique and the remaining cortical material was removed with the irrigation-aspiration handpiece. The eye was inflated with viscoelastic and the [id number]  lens  was placed in the eye and rotated to within a few degrees of the predetermined orientation.  The remaining viscoelastic was removed from the eye.  The Sinskey hook was used to rotate the toric lens into its final resting place at 030 degrees. The eye was inflated to a physiologic pressure and found to be  watertight. 0.89ml of Vigamox was placed in the anterior chamber.  The eye was dressed with Vigamox. The patient was given protective glasses to wear throughout the day and a shield with which to sleep tonight. The patient was also given drops with which to begin a drop regimen today and will follow-up with me in one day. Implant Name Type Inv. Item Serial No. Manufacturer Lot No. LRB No. Used Action  LENS IOL ACRYSOF VIVITY 23.5 - [id number]  LENS IOL ACRYSOF VIVITY 23.5 [id number] ALCON  Right 1 Implanted   Procedure(s) with comments: CATARACT EXTRACTION PHACO AND INTRAOCULAR LENS PLACEMENT (IOC) RIGHT VIVITY TORIC LENS (Right) - 6.54 00:46.4  Electronically signed: Birder Robson 01/28/2021 10:19 AM

## 2021-01-28 NOTE — Anesthesia Preprocedure Evaluation (Signed)
Anesthesia Evaluation  Patient identified by MRN, date of birth, ID band Patient awake    Reviewed: Allergy & Precautions, H&P , NPO status , Patient's Chart, lab work & pertinent test results, reviewed documented beta blocker date and time   History of Anesthesia Complications (+) PONV and history of anesthetic complications  Airway Mallampati: II  TM Distance: >3 FB Neck ROM: full    Dental no notable dental hx.    Pulmonary neg pulmonary ROS,    Pulmonary exam normal breath sounds clear to auscultation       Cardiovascular Exercise Tolerance: Good hypertension, + CAD and + Past MI  Normal cardiovascular exam Rhythm:regular Rate:Normal  Cath in 1999, mild CAD, no problems since   Neuro/Psych  Headaches, PSYCHIATRIC DISORDERS Depression Spinal cord stimulator in place but not functioning    GI/Hepatic Neg liver ROS, GERD  ,  Endo/Other  negative endocrine ROS  Renal/GU negative Renal ROS  negative genitourinary   Musculoskeletal   Abdominal   Peds  Hematology negative hematology ROS (+)   Anesthesia Other Findings   Reproductive/Obstetrics negative OB ROS                             Anesthesia Physical Anesthesia Plan  ASA: III  Anesthesia Plan: MAC   Post-op Pain Management:    Induction:   PONV Risk Score and Plan:   Airway Management Planned:   Additional Equipment:   Intra-op Plan:   Post-operative Plan:   Informed Consent: I have reviewed the patients History and Physical, chart, labs and discussed the procedure including the risks, benefits and alternatives for the proposed anesthesia with the patient or authorized representative who has indicated his/her understanding and acceptance.     Dental Advisory Given  Plan Discussed with: CRNA and Anesthesiologist  Anesthesia Plan Comments:         Anesthesia Quick Evaluation

## 2021-01-28 NOTE — H&P (Signed)
[facility]   Primary Care Physician:  [name], NP Ophthalmologist: Dr. George Ina  Pre-Procedure History & Physical: HPI:  Grant Young is a 75 y.o. male here for cataract surgery.   Past Medical History:  Diagnosis Date  . Anxiety    Per New Patient Packet  . Chronic back pain    Per New Patient Packet  . Chronic heart disease    Per New Patient Packet  . Depression   . GERD (gastroesophageal reflux disease)   . Headache   . History of CT scan of brain 09/14/2018   Per New Patient Packet  . History of depression    Per New Patient Packet  . History of gastritis    Per New Patient Packet  . History of headache    Per New Patient Packet  . History of kidney stones    Per New Patient Packet  . History of neuropathy    Per New Patient Packet  . Hypertension    Per New Patient Packet  . Insomnia   . Kidney stone   . Lumbar radicular pain    Per New Patient Packet  . Lyme disease    Per New Patient Packet  . Myocardial infarction ([place])   . Overactive bladder   . PONV (postoperative nausea and vomiting)   . Skin cancer, basal cell   . Sleep trouble    Per New Patient Packet  . Spinal cord stimulator status    01/08/21 - not currently using.  Marland Kitchen Squamous cell skin cancer   . Substance abuse [facility])     Past Surgical History:  Procedure Laterality Date  . BACK SURGERY    . CARDIAC CATHETERIZATION    . CATARACT EXTRACTION W/PHACO Left 01/14/2021   Procedure: CATARACT EXTRACTION PHACO AND INTRAOCULAR LENS PLACEMENT ([place]) LEFT VIVITY TORIC LENS 8.75 00:56.7;  Surgeon: Birder Robson, MD;  Location: [place];  Service: Ophthalmology;  Laterality: Left;  . CHOLECYSTECTOMY  2017  . COLONOSCOPY  09/15/2015   Per New Patient Packet  . COLONOSCOPY WITH PROPOFOL N/A 10/03/2020   Procedure: COLONOSCOPY WITH PROPOFOL;  Surgeon: [name], MD;  Location[facility] END[facility]COPY;  Service: Gastroenterology;  Laterality: N/A;  . ESOPHAGOGASTRODUODENOSCOPY (EGD)  WITH PROPOFOL N/A 10/03/2020   Procedure: ESOPHAGOGASTRODUODENOSCOPY (EGD) WITH PROPOFOL;  Surgeon: [name], MD;  Location: [facility] ENDOSCOPY;  Service: Gastroenterology;  Laterality: N/A;  . FOOT SURGERY Bilateral 03/18/2020  . FOOT SURGERY  08/032021  . GALLBLADDER SURGERY  09/15/2015   Gallbladder Removal. Procedure done by Dr.Beverly. Per New Patient Packet  . KIDNEY STONE SURGERY  09/14/1980   Too many to count. Per New Patient Packet 09/14/1980-09/15/1995  . KIDNEY STONE SURGERY  09/15/1995   Too many to count. Per New Patient Packet  . LITHOTRIPSY  09/14/2014   Per New Patient Packet  . LITHOTRIPSY  09/14/1996   No Stints Used. Per New Patient Packet  . LITHOTRIPSY  09/14/1997   No Stints used. Per New Patient Packet  . PAIN PUMP IMPLANTATION    . PAIN PUMP REMOVAL    . SHOULDER SURGERY Right 1984  . SIGMOIDOSCOPY  09/14/2017   Per New Patient Packet  . SPINAL CORD STIMULATOR IMPLANT    . TONSILLECTOMY    . UPPER GI ENDOSCOPY      Prior to Admission medications   Medication Sig Start Date End Date Taking? Authorizing Provider  amitriptyline (ELAVIL) 10 MG tablet Take 20 mg by mouth at bedtime.  Yes [provider]  butalbital-acetaminophen-caffeine (FIORICET) 50-325-40 MG tablet Take by mouth. 09/13/20  Yes [provider]  chlorthalidone (HYGROTON) 25 MG tablet Take 1 tablet (25 mg total) by mouth daily. 01/14/21  Yes [name], NP  Cholecalciferol 125 MCG (5000 UT) TABS Take by mouth. 04/15/20  Yes [provider]  Cyanocobalamin (VITAMIN B-12) 5000 MCG LOZG Take 1 lozenge by mouth daily.    Yes [provider]  Digestive Aids Mixture (DIGESTION GB PO) Take 1 capsule by mouth daily.    Yes [provider]  escitalopram (LEXAPRO) 20 MG tablet Take 1 tablet (20 mg total) by mouth daily. 01/09/21  Yes [name], NP  ezetimibe (ZETIA) 10 MG tablet Take 1 tablet (10 mg total) by mouth daily. 06/04/20  Yes [name],  NP  GLUTATHIONE PO Take 1 tablet by mouth daily.   Yes [provider]  HYDROcodone-acetaminophen (NORCO) 10-325 MG tablet Take 1-2 tablets by mouth 2 (two) times daily as needed for severe pain. 01/08/21 02/07/21 Yes [name], MD  Hyoscyamine Sulfate (HYOSCYAMINE PO) Take by mouth as needed.   Yes [provider]  Loperamide HCl (IMODIUM PO) Take by mouth as needed.    Yes [provider]  LORazepam (ATIVAN) 0.5 MG tablet Take 1 tablet (0.5 mg total) by mouth 2 (two) times daily. 01/14/21  Yes [name], NP  Melatonin 10 MG TABS Take 10 mg by mouth at bedtime.    Yes [provider]  NONFORMULARY OR COMPOUNDED ITEM Bi-Mix Injection  Papaverine HCI/Phentolamine Mesylate 150mg /55mg /Vial Inject 42ml prior to intercourse   For intracavernosal use only 06/19/20  Yes Billey Co, MD  ondansetron (ZOFRAN) 4 MG tablet Take 4 mg by mouth as needed for nausea or vomiting.    Yes [provider]  OVER THE COUNTER MEDICATION G I Detox  - 2x/week Gastromend HP Daily Glutaloemine Daily Lithium Oronate Daily GABA Liposomal Daily Fungus Eliminator   Yes [provider]  PASSION FLOWER-VALERIAN PO Take by mouth.   Yes [provider]  Potassium 99 MG TABS Take by mouth 3 (three) times a week. K-Zyme   Yes [provider]  Probiotic Product (PROBIOTIC DAILY PO) Take by mouth.   Yes [provider]  prochlorperazine (COMPAZINE) 10 MG tablet Take 10 mg by mouth as needed. 07/01/20  Yes [provider]  propranolol (INDERAL) 40 MG tablet Take 40 mg by mouth 2 (two) times daily.   Yes [provider]  RABEprazole (ACIPHEX) 20 MG tablet TAKE 1 TABLET BY MOUTH TWICE A DAY 07/22/20  Yes [name], MD  Rhodiola rosea (RHODIOLA PO) Take by mouth.   Yes [provider]  sucralfate (CARAFATE) 1 g tablet Take 1 g by mouth 4 (four) times daily as needed.    Yes [provider]  testosterone  cypionate (DEPOTESTOSTERONE CYPIONATE) 200 MG/ML injection Inject 60 mg into the muscle every 14 (fourteen) days. 11/27/20  Yes [provider]  Theanine 50 MG TBDP Take by mouth at bedtime.   Yes [provider]  tizanidine (ZANAFLEX) 2 MG capsule Take 1 capsule (2 mg total) by mouth every 12 (twelve) hours as needed. 01/14/21  Yes [name], [name], NP  valACYclovir (VALTREX) 1000 MG tablet TAKE 2 TABLETS BY MOUTH AND REPEAT IN 12 HOURS FOR COLD SORE 10/08/20  Yes [name], NP  Vibegron (GEMTESA) 75 MG TABS Take 75 mg by mouth daily. 01/08/21  Yes Vaillancourt,  Samantha, PA-C  anastrozole (ARIMIDEX) 1 MG tablet Take by mouth. Patient not taking: Reported on 01/08/2021 11/28/20   [provider]  cholestyramine (QUESTRAN) 4 g packet Take 1 packet (4 g total) by mouth 3 (three) times daily. 10/16/20 01/14/21  [name], MD  dicyclomine (BENTYL) 20 MG tablet Take 20 mg by mouth 4 (four) times daily. Patient not taking: Reported on 01/08/2021 07/20/20   [provider]    Allergies as of 11/28/2020 - Review Complete 11/28/2020  Allergen Reaction Noted  . Ace inhibitors  08/24/2019  . Fluoxetine Anxiety 10/31/2019  . Metoclopramide  10/31/2019  . Nalbuphine  10/31/2019  . Other  01/03/2014  . Amoxicillin-pot clavulanate Nausea Only 10/31/2019  . Doxazosin Rash 02/01/2013  . Duloxetine Nausea Only 10/31/2019  . Penicillins Nausea Only 10/31/2019  . Tamsulosin Itching and Anxiety 02/01/2013  . Trazodone and nefazodone Itching, Anxiety, and Rash 02/13/2012  . Amlodipine  10/31/2019  . Cinoxacin  10/31/2019  . Ciprofloxacin  08/24/2019  . Nebivolol  08/24/2019  . Olanzapine  10/31/2019  . Olmesartan  08/24/2019  . Pregabalin  10/31/2019  . Prostaglandins  05/07/2014  . Thyroid hormones  06/09/2018  . Zolpidem  10/31/2019  . Duloxetine hcl  01/03/2014  . Fluoxetine hcl  01/03/2014  . Phenytoin Anxiety 10/31/2019    Family History  Problem Relation Age  of Onset  . Heart disease Father   . Heart failure Father   . Hypertension Father   . Stroke Father   . Stroke Mother   . Dementia Mother   . Kidney Stones Daughter   . Anxiety disorder Daughter   . OCD Daughter     Social History   Socioeconomic History  . Marital status: Married    Spouse name: Not on file  . Number of children: Not on file  . Years of education: Not on file  . Highest education level: Not on file  Occupational History  . Not on file  Tobacco Use  . Smoking status: Never Smoker  . Smokeless tobacco: Never Used  Vaping Use  . Vaping Use: Never used  Substance and Sexual Activity  . Alcohol use: Yes    Comment: 1 Drink a Month.  . Drug use: Not Currently  . Sexual activity: Yes    Birth control/protection: None  Other Topics Concern  . Not on file  Social History Narrative   Tobacco use, amount per day now: None   Past tobacco use, amount per day: None   How many years did you use tobacco: 0   Alcohol use (drinks per week): 0-1 Month   Diet:   Do you drink/eat things with caffeine: Occasionally ( Hot Chocolate and maybe 1/4 of 16oz Pepsi 2-3 times a week.   Marital status: Married                                  What year were you married? 1970   Do you live in a house, apartment, assisted living, condo, trailer, etc.? House   Is it one or more stories? One   How many persons live in your home? 2   Do you have pets in your home?( please list)  No   Highest Level of education completed: Masters   Current or past profession: Intensive Transport planner for [employer]   Do you exercise? Yes  Type and how often? [place], Recumbent Bike, 3 times a week. Try to get a 1.2 mile walk at least 3 times a week or more.     Do you have a living will? Yes   Do you have a DNR form?  No                                 If not, do you want to discuss one?   Do you have signed POA/HPOA forms? Yes                        If so, please bring to you appointment    Do you have any difficulty bathing or dressing yourself? No    Do you have difficulty preparing food or eating? No   Do you have difficulty managing your medications? No   Do you have any difficulty managing your finances? No   Do you have any difficulty affording your medications? No         Social Determinants of Radio broadcast assistant Strain: Not on file  Food Insecurity: Not on file  Transportation Needs: Not on file  Physical Activity: Not on file  Stress: Not on file  Social Connections: Not on file  Intimate Partner Violence: Not on file    Review of Systems: See HPI, otherwise negative ROS  Physical Exam: BP (!) 182/80   Pulse (!) 59   Temp (!) 97.3 F (36.3 C) (Temporal)   Resp 16   Ht 5\' 9"  (1.753 m)   Wt 66.2 kg   SpO2 99%   BMI 21.56 kg/m  General:   Alert,  pleasant and cooperative in NAD Head:  Normocephalic and atraumatic. Respiratory:  Normal work of breathing. Cardiovascular:  RRR  Impression/Plan: Grant Young is here for cataract surgery.  Risks, benefits, limitations, and alternatives regarding cataract surgery have been reviewed with the patient.  Questions have been answered.  All parties agreeable.   Birder Robson, MD  01/28/2021, 9:51 AM

## 2021-01-28 NOTE — Anesthesia Postprocedure Evaluation (Signed)
Anesthesia Post Note  Patient: Grant Young  Procedure(s) Performed: CATARACT EXTRACTION PHACO AND INTRAOCULAR LENS PLACEMENT ([facility]) RIGHT VIVITY TORIC LENS (Right Eye)     Patient location during evaluation: PACU Anesthesia Type: MAC Level of consciousness: awake and alert Pain management: pain level controlled Vital Signs Assessment: post-procedure vital signs reviewed and stable Respiratory status: spontaneous breathing, nonlabored ventilation, respiratory function stable and patient connected to nasal cannula oxygen Cardiovascular status: stable and blood pressure returned to baseline Postop Assessment: no apparent nausea or vomiting Anesthetic complications: no   No complications documented.  Trecia Rogers

## 2021-01-28 NOTE — Transfer of Care (Signed)
Immediate Anesthesia Transfer of Care Note  Patient: Grant Young  Procedure(s) Performed: CATARACT EXTRACTION PHACO AND INTRAOCULAR LENS PLACEMENT ([facility]) RIGHT VIVITY TORIC LENS (Right Eye)  Patient Location: PACU  Anesthesia Type: MAC  Level of Consciousness: awake, alert  and patient cooperative  Airway and Oxygen Therapy: Patient Spontanous Breathing and Patient connected to supplemental oxygen  Post-op Assessment: Post-op Vital signs reviewed, Patient's Cardiovascular Status Stable, Respiratory Function Stable, Patent Airway and No signs of Nausea or vomiting  Post-op Vital Signs: Reviewed and stable  Complications: No complications documented.

## 2021-01-29 ENCOUNTER — Encounter: Payer: Self-pay | Admitting: Ophthalmology

## 2021-02-05 ENCOUNTER — Encounter: Payer: Self-pay | Admitting: Internal Medicine

## 2021-02-05 ENCOUNTER — Other Ambulatory Visit: Payer: Self-pay

## 2021-02-05 ENCOUNTER — Ambulatory Visit (INDEPENDENT_AMBULATORY_CARE_PROVIDER_SITE_OTHER): Payer: Medicare Other | Admitting: Internal Medicine

## 2021-02-05 DIAGNOSIS — G894 Chronic pain syndrome: Secondary | ICD-10-CM | POA: Diagnosis not present

## 2021-02-05 DIAGNOSIS — F32A Depression, unspecified: Secondary | ICD-10-CM

## 2021-02-05 DIAGNOSIS — I10 Essential (primary) hypertension: Secondary | ICD-10-CM

## 2021-02-05 DIAGNOSIS — F419 Anxiety disorder, unspecified: Secondary | ICD-10-CM

## 2021-02-05 DIAGNOSIS — R251 Tremor, unspecified: Secondary | ICD-10-CM

## 2021-02-05 MED ORDER — CLONAZEPAM 1 MG PO TABS: 1.0000 mg | ORAL_TABLET | Freq: Two times a day (BID) | ORAL | 0 refills | Status: DC

## 2021-02-05 MED ORDER — CHLORTHALIDONE 25 MG PO TABS
25.0000 mg | ORAL_TABLET | Freq: Every day | ORAL | 0 refills | Status: DC
Start: 2021-02-05 — End: 2021-07-18

## 2021-02-05 NOTE — Progress Notes (Signed)
Subjective:    Patient ID: Grant Young, male    DOB: 28-Apr-1946, 75 y.o.   MRN: [id number]  HPI  Patient presents to the clinic today for follow-up of anxiety, tremor, hypertension and neuropathic pain in legs.  HTN: His BP today is 132/74.  He recently stopped taking Propanolol and transition back to Labetalol.  He does feel like this is more effective for blood pressure control but does feel like his anxiety, tremor and neuropathic pain in his right lower extremity is worse on the Labetalol.  ECG from 12/2019 reviewed.  Anxiety: He does feel like this is worse since changing from Propanolol to Labetalol.  He is currently taking Escitalopram as prescribed.  He did wean himself off the Ativan but feels like he needs something similar to help control his anxiety.  He has taken Valium in the past and questions if this would be a good medication for him.  He is not currently seeing a therapist.  He denies depression, SI/HI.  Tremor: Worse since changing from Propanolol to Labetalol.  He is not sure if it is medication related or because of his increased anxiety.  He did start Labetalol but it does not seem to help his tremor.  He has seen neurology in the past for the same.  Neuropathic Pain: He reports a burning sensation of his right lower extremity.  He has taken Gabapentin in the past and did not like the way that it made him feel.  He has no history of DM2.  Review of Systems      Past Medical History:  Diagnosis Date  . Anxiety    Per New Patient Packet  . Chronic back pain    Per New Patient Packet  . Chronic heart disease    Per New Patient Packet  . Depression   . GERD (gastroesophageal reflux disease)   . Headache   . History of CT scan of brain 09/14/2018   Per New Patient Packet  . History of depression    Per New Patient Packet  . History of gastritis    Per New Patient Packet  . History of headache    Per New Patient Packet  . History of kidney stones    Per New  Patient Packet  . History of neuropathy    Per New Patient Packet  . Hypertension    Per New Patient Packet  . Insomnia   . Kidney stone   . Lumbar radicular pain    Per New Patient Packet  . Lyme disease    Per New Patient Packet  . Myocardial infarction ([place])   . Overactive bladder   . PONV (postoperative nausea and vomiting)   . Skin cancer, basal cell   . Sleep trouble    Per New Patient Packet  . Spinal cord stimulator status    01/08/21 - not currently using.  Marland Kitchen Squamous cell skin cancer   . Substance abuse ([place])     Current Outpatient Medications  Medication Sig Dispense Refill  . amitriptyline (ELAVIL) 10 MG tablet Take 20 mg by mouth at bedtime.     Marland Kitchen anastrozole (ARIMIDEX) 1 MG tablet Take by mouth. (Patient not taking: Reported on 01/08/2021)    . butalbital-acetaminophen-caffeine (FIORICET) 50-325-40 MG tablet Take by mouth.    . chlorthalidone (HYGROTON) 25 MG tablet Take 1 tablet (25 mg total) by mouth daily. 90 tablet 0  . Cholecalciferol 125 MCG (5000 UT) TABS Take by mouth.    Marland Kitchen  cholestyramine (QUESTRAN) 4 g packet Take 1 packet (4 g total) by mouth 3 (three) times daily. 270 packet 3  . Cyanocobalamin (VITAMIN B-12) 5000 MCG LOZG Take 1 lozenge by mouth daily.     Marland Kitchen dicyclomine (BENTYL) 20 MG tablet Take 20 mg by mouth 4 (four) times daily. (Patient not taking: Reported on 01/08/2021)    . Digestive Aids Mixture (DIGESTION GB PO) Take 1 capsule by mouth daily.     Marland Kitchen escitalopram (LEXAPRO) 20 MG tablet Take 1 tablet (20 mg total) by mouth daily. 90 tablet 1  . ezetimibe (ZETIA) 10 MG tablet Take 1 tablet (10 mg total) by mouth daily. 90 tablet 1  . GLUTATHIONE PO Take 1 tablet by mouth daily.    Marland Kitchen HYDROcodone-acetaminophen (NORCO) 10-325 MG tablet Take 1-2 tablets by mouth 2 (two) times daily as needed for severe pain. 60 tablet 0  . Hyoscyamine Sulfate (HYOSCYAMINE PO) Take by mouth as needed.    . Loperamide HCl (IMODIUM PO) Take by mouth as needed.     Marland Kitchen  LORazepam (ATIVAN) 0.5 MG tablet Take 1 tablet (0.5 mg total) by mouth 2 (two) times daily. 60 tablet 0  . Melatonin 10 MG TABS Take 10 mg by mouth at bedtime.     . NONFORMULARY OR COMPOUNDED ITEM Bi-Mix Injection  Papaverine HCI/Phentolamine Mesylate 150mg /55mg /Vial Inject 61ml prior to intercourse   For intracavernosal use only 5 each 11  . ondansetron (ZOFRAN) 4 MG tablet Take 4 mg by mouth as needed for nausea or vomiting.     Marland Kitchen OVER THE COUNTER MEDICATION G I Detox  - 2x/week Gastromend HP Daily Glutaloemine Daily Lithium Oronate Daily GABA Liposomal Daily Fungus Eliminator    . PASSION FLOWER-VALERIAN PO Take by mouth.    . Potassium 99 MG TABS Take by mouth 3 (three) times a week. K-Zyme    . Probiotic Product (PROBIOTIC DAILY PO) Take by mouth.    . prochlorperazine (COMPAZINE) 10 MG tablet Take 10 mg by mouth as needed.    . propranolol (INDERAL) 40 MG tablet Take 40 mg by mouth 2 (two) times daily.    . RABEprazole (ACIPHEX) 20 MG tablet TAKE 1 TABLET BY MOUTH TWICE A DAY 180 tablet 1  . Rhodiola rosea (RHODIOLA PO) Take by mouth.    . sucralfate (CARAFATE) 1 g tablet Take 1 g by mouth 4 (four) times daily as needed.     . testosterone cypionate (DEPOTESTOSTERONE CYPIONATE) 200 MG/ML injection Inject 60 mg into the muscle every 14 (fourteen) days.    . Theanine 50 MG TBDP Take by mouth at bedtime.    . tizanidine (ZANAFLEX) 2 MG capsule Take 1 capsule (2 mg total) by mouth every 12 (twelve) hours as needed. 180 capsule 0  . valACYclovir (VALTREX) 1000 MG tablet TAKE 2 TABLETS BY MOUTH AND REPEAT IN 12 HOURS FOR COLD SORE 4 tablet 1  . Vibegron (GEMTESA) 75 MG TABS Take 75 mg by mouth daily. 90 tablet 3   No current facility-administered medications for this visit.    Allergies  Allergen Reactions  . Ace Inhibitors     Other reaction(s): Cough  . Fluoxetine Anxiety    "made me fall asleep" per pt "bad headaches and "makes Me Crazy" historical allergy noted in  [employer] "made me fall asleep" per pt "bad headaches and "makes Me Crazy" Per New Patient Packet.    . Metoclopramide     Other reaction(s): Other (See Comments), Other (See Comments), Unknown Tardive  Dyskinesia  historical allergy noted in [employer] Tardive Dyskinesia  Per New Patient Packet.  . Nalbuphine     Used Post Back surgery- Anesthesiologist Error. Patient had Narcotic Withdraw. Per New Patient Packet.   . Other     Other reaction(s): Other (See Comments) Altered mental status in combo with narcotics at previous hospitalization - Full Withdrawal Symptoms Other reaction(s): Rash  . Amoxicillin-Pot Clavulanate Nausea Only    Per New Patient Packet.  . Doxazosin Rash    Other reaction(s): Other - See Comments, Rash UNKNOWN REACTION UNKNOWN REACTION   . Duloxetine Nausea Only    Per New Patient Packet.   Marland Kitchen Penicillins Nausea Only    Per New Patient Packet.  . Tamsulosin Itching and Anxiety    Restless, Flushing, Heavy Chest, Itching, Hyperactive mood and Anxiety. Unable to handle side effects. Per New Patient Packet.    . Trazodone And Nefazodone Itching, Anxiety and Rash    Headache. "INCREASED MY ANXIETY AND HEARTRATE" Flushing, tachycardia "INCREASED MY ANXIETY AND HEARTRATE" Per New Patient Packet.   . Amlodipine     Shaking, unsure of reaction. Per New Patient Packet.    . Cinoxacin     GI Intolerance, and Dizziness. Per New Patient Packet.   . Ciprofloxacin     Other reaction(s): Unknown  . Nebivolol     Other reaction(s): Unknown  . Olanzapine     Headache and unable to sleep for 3 nights. Per New Patient Packet.   . Olmesartan     Other reaction(s): Unknown  . Pregabalin     Confusion, Lack of concentration, dizziness, and likely drowsiness. Per New Patient Packet.    . Prostaglandins     Other reaction(s): Other (See Comments) Intolerance  . Thyroid Hormones     Other reaction(s): Other (See Comments) Thyroid (Nature Thyroid) contraindicated  with some of your other medications.  . Zolpidem     Nightmares, Ineffective after 2 days. Per New Patient Packet.   . Duloxetine Hcl     Other reaction(s): Rash  . Fluoxetine Hcl     Other reaction(s): Rash  . Phenytoin Anxiety    Hyperactivity, and Ineffective. Per New Patient Packet.     Family History  Problem Relation Age of Onset  . Heart disease Father   . Heart failure Father   . Hypertension Father   . Stroke Father   . Stroke Mother   . Dementia Mother   . Kidney Stones Daughter   . Anxiety disorder Daughter   . OCD Daughter     Social History   Socioeconomic History  . Marital status: Married    Spouse name: Not on file  . Number of children: Not on file  . Years of education: Not on file  . Highest education level: Not on file  Occupational History  . Not on file  Tobacco Use  . Smoking status: Never Smoker  . Smokeless tobacco: Never Used  Vaping Use  . Vaping Use: Never used  Substance and Sexual Activity  . Alcohol use: Yes    Comment: 1 Drink a Month.  . Drug use: Not Currently  . Sexual activity: Yes    Birth control/protection: None  Other Topics Concern  . Not on file  Social History Narrative   Tobacco use, amount per day now: None   Past tobacco use, amount per day: None   How many years did you use tobacco: 0   Alcohol use (drinks per week): 0-1 Month  Diet:   Do you drink/eat things with caffeine: Occasionally ( Hot Chocolate and maybe 1/4 of 16oz Pepsi 2-3 times a week.   Marital status: Married                                  What year were you married? 1970   Do you live in a house, apartment, assisted living, condo, trailer, etc.? House   Is it one or more stories? One   How many persons live in your home? 2   Do you have pets in your home?( please list)  No   Highest Level of education completed: Masters   Current or past profession: Intensive Transport planner for [employer]   Do you exercise? Yes                                     Type and how often? [place], Recumbent Bike, 3 times a week. Try to get a 1.2 mile walk at least 3 times a week or more.     Do you have a living will? Yes   Do you have a DNR form?  No                                 If not, do you want to discuss one?   Do you have signed POA/HPOA forms? Yes                       If so, please bring to you appointment    Do you have any difficulty bathing or dressing yourself? No    Do you have difficulty preparing food or eating? No   Do you have difficulty managing your medications? No   Do you have any difficulty managing your finances? No   Do you have any difficulty affording your medications? No         Social Determinants of Radio broadcast assistant Strain: Not on file  Food Insecurity: Not on file  Transportation Needs: Not on file  Physical Activity: Not on file  Stress: Not on file  Social Connections: Not on file  Intimate Partner Violence: Not on file     Constitutional: Denies fever, malaise, fatigue, headache or abrupt weight changes.  HEENT: Denies eye pain, eye redness, ear pain, ringing in the ears, wax buildup, runny nose, nasal congestion, bloody nose, or sore throat. Respiratory: Denies difficulty breathing, shortness of breath, cough or sputum production.   Cardiovascular: Denies chest pain, chest tightness, palpitations or swelling in the hands or feet.  Musculoskeletal: Denies decrease in range of motion, difficulty with gait, muscle pain or joint pain and swelling.  Skin: Denies redness, rashes, lesions or ulcercations.  Neurological: Patient reports tremor, neuropathic pain of RLE.  Denies dizziness, difficulty with memory, difficulty with speech or problems with balance and coordination.  Psych: Patient has a history of anxiety.  Denies depression, SI/HI.  No other specific complaints in a complete review of systems (except as listed in HPI above).  Objective:   Physical Exam  BP 132/74 (BP  Location: Left Arm, Patient Position: Sitting, Cuff Size: Normal)   Pulse 80   Temp 97.8 F (36.6 C) (Temporal)   Resp 17   Ht  5\' 9"  (1.753 m)   Wt 146 lb 9.6 oz (66.5 kg)   SpO2 99%   BMI 21.65 kg/m   Wt Readings from Last 3 Encounters:  01/28/21 146 lb (66.2 kg)  01/14/21 140 lb (63.5 kg)  12/16/20 143 lb 9.6 oz (65.1 kg)    General: Appears his stated age, chronically ill appearing  in NAD. Skin: Warm, dry and intact. No rashes ulcerations noted. HEENT: Head: normal shape and size; Eyes: sclera white and EOMs intact;  Cardiovascular: Normal rate and rhythm.  Pulmonary: CTA bilaterally. MSK: Gait slow and steady without device. Neurological: Alert and oriented.  Resting tremor noted of BLE. Psychiatric: Mood and affect normal.  He is anxious appearing. Judgment and thought content normal.     BMET    Component Value Date/Time   NA 145 05/20/2020 0000   K 3.2 (A) 05/20/2020 0000   CL 102 05/20/2020 0000   CO2 34 (A) 05/20/2020 0000   BUN 1 (A) 05/20/2020 0000   CREATININE 0.90 03/06/2020 1134   CALCIUM 9.8 05/20/2020 0000   GFRNONAA 87 05/20/2020 0000   GFRAA 101 05/20/2020 0000    Lipid Panel     Component Value Date/Time   CHOL 170 05/16/2020 0000   TRIG 59 05/16/2020 0000   HDL 56 05/16/2020 0000   LDLCALC 100 05/16/2020 0000    CBC    Component Value Date/Time   WBC 6.2 05/20/2020 0000   RBC 5.21 (A) 05/20/2020 0000   HGB 15.6 05/20/2020 0000   HCT 47 05/20/2020 0000   PLT 199 05/20/2020 0000    Hgb A1C Lab Results  Component Value Date   HGBA1C 5.1 10/17/2019            Assessment & Plan:     Webb Silversmith, NP This visit occurred during the SARS-CoV-2 public health emergency.  Safety protocols were in place, including screening questions prior to the visit, additional usage of staff PPE, and extensive cleaning of exam room while observing appropriate contact time as indicated for disinfecting solutions.

## 2021-02-06 ENCOUNTER — Encounter: Payer: Self-pay | Admitting: Student in an Organized Health Care Education/Training Program

## 2021-02-06 ENCOUNTER — Ambulatory Visit
Payer: Medicare Other | Attending: Student in an Organized Health Care Education/Training Program | Admitting: Student in an Organized Health Care Education/Training Program

## 2021-02-06 VITALS — BP 134/83 | HR 80 | Temp 97.0°F | Ht 69.0 in | Wt 146.0 lb

## 2021-02-06 DIAGNOSIS — M47816 Spondylosis without myelopathy or radiculopathy, lumbar region: Secondary | ICD-10-CM | POA: Diagnosis not present

## 2021-02-06 DIAGNOSIS — M5481 Occipital neuralgia: Secondary | ICD-10-CM

## 2021-02-06 DIAGNOSIS — G43701 Chronic migraine without aura, not intractable, with status migrainosus: Secondary | ICD-10-CM | POA: Diagnosis not present

## 2021-02-06 DIAGNOSIS — M7918 Myalgia, other site: Secondary | ICD-10-CM

## 2021-02-06 DIAGNOSIS — G894 Chronic pain syndrome: Secondary | ICD-10-CM | POA: Diagnosis present

## 2021-02-06 MED ORDER — HYDROCODONE-ACETAMINOPHEN 10-325 MG PO TABS: 1.0000 | ORAL_TABLET | Freq: Two times a day (BID) | ORAL | 0 refills | Status: DC | PRN

## 2021-02-06 MED ORDER — HYDROCODONE-ACETAMINOPHEN 10-325 MG PO TABS: 1.0000 | ORAL_TABLET | Freq: Two times a day (BID) | ORAL | 0 refills | Status: AC | PRN

## 2021-02-06 NOTE — Progress Notes (Signed)
Nursing Pain Medication Assessment:  Safety precautions to be maintained throughout the outpatient stay will include: orient to surroundings, keep bed in low position, maintain call bell within reach at all times, provide assistance with transfer out of bed and ambulation.  Medication Inspection Compliance: Pill count conducted under aseptic conditions, in front of the patient. Neither the pills nor the bottle was removed from the patient's sight at any time. Once count was completed pills were immediately returned to the patient in their original bottle.  Medication: Hydrocodone/APAP Pill/Patch Count: 24 of 60 pills remain Pill/Patch Appearance: Markings consistent with prescribed medication Bottle Appearance: Standard pharmacy container. Clearly labeled. Filled Date: 5 / 3 / 22 Last Medication intake:  TodaySafety precautions to be maintained throughout the outpatient stay will include: orient to surroundings, keep bed in low position, maintain call bell within reach at all times, provide assistance with transfer out of bed and ambulation.

## 2021-02-06 NOTE — Progress Notes (Signed)
PROVIDER NOTE: Information contained herein reflects review and annotations entered in association with encounter. Interpretation of such information and data should be left to medically-trained personnel. Information provided to patient can be located elsewhere in the medical record under "Patient Instructions". Document created using STT-dictation technology, any transcriptional errors that may result from process are unintentional.    Patient: Grant Young  Service Category: E/M  Provider: [name], MD  DOB: July 16, 1946  DOS: 02/06/2021  Specialty: Interventional Pain Management  MRN: [id number]  Setting: Ambulatory outpatient  PCP: [name], NP  Type: Established Patient    Referring Provider: [name], NP  Location: Office  Delivery: Face-to-face     HPI  [name], a 75 y.o. year old male, is here today because of his Lumbar facet arthropathy [M47.816]. Grant Young primary complain today is Back Pain Last encounter: My last encounter with him was on 11/13/2020. 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 Pain Assessment: Severity of Chronic pain is reported as a 4 /10. Location: Back Lower/Denies. Onset: More than a month ago. Quality: Aching,Burning,Throbbing. Timing: Constant. Modifying factor(s): ice pack, repositioning , laying down, meds. Vitals:  height is 5' 9" (1.753 m) and weight is 146 lb (66.2 kg). His temperature is 97 F (36.1 C) (abnormal). His blood pressure is 134/83 and his pulse is 80. His oxygen saturation is 99%.   Reason for encounter: medication management.     Patient overall is doing well.  His low back pain is well controlled at this time.  He is status post bilateral L3, L4, L5 lumbar facet medial branch nerve block on 11/13/2020 which is still providing him  pain relief.  He continues hydrocodone as prescribed.  He is endorsing increased right calf cramps.  He was recently prescribed Klonopin by his primary care provider.  We discussed the risks of concomitant benzodiazepine and opioid therapy as it pertains to cardiovascular and respiratory health.  Patient is on a fairly low dose of both but we will keep a close eye.  I will refill his hydrocodone as below.  As needed order placed for repeat lumbar facet medial branch nerve block should he have return of pain in his low back.  Pharmacotherapy Assessment   Analgesic: Hydrocodone 10 mg BID PRN    Monitoring: [place] PMP: PDMP reviewed during this encounter.       Pharmacotherapy: No side-effects or adverse reactions reported. Compliance: No problems identified. Effectiveness: Clinically acceptable.  Chauncey Fischer, RN  02/06/2021  1:53 PM  Sign when Signing Visit Nursing Pain Medication Assessment:  Safety precautions to be maintained throughout the outpatient stay will include: orient to surroundings, keep bed in low position, maintain call bell within reach at all times, provide assistance with transfer out of bed and ambulation.  Medication Inspection Compliance: Pill count conducted under aseptic conditions, in front of the patient. Neither the pills nor the bottle was removed from the patient's sight at any time. Once count was completed pills were immediately returned to the patient in their original bottle.  Medication: Hydrocodone/APAP Pill/Patch Count: 24 of 60 pills remain Pill/Patch Appearance: Markings consistent with prescribed medication Bottle Appearance: Standard pharmacy container. Clearly labeled. Filled Date: 5 / 3 / 22 Last Medication intake:  TodaySafety precautions to be maintained throughout the outpatient stay will include: orient to surroundings, keep bed  in low position, maintain call bell within reach at all times, provide assistance with transfer out of bed and ambulation.      UDS:  Summary  Date Value Ref Range Status  09/17/2020 Note  Final    Comment:    ==================================================================== Compliance Drug Analysis, Ur ==================================================================== Specimen Alert Note: Urinary creatinine is low; ability to detect some drugs may be compromised. Interpret results with caution. (Creatinine) ==================================================================== Test                             Result       Flag       Units  Drug Present and Declared for Prescription Verification   Lorazepam                      1107         EXPECTED   ng/mg creat    Source of lorazepam is a scheduled prescription medication.    Butalbital                     PRESENT      EXPECTED   Citalopram                     PRESENT      EXPECTED   Desmethylcitalopram            PRESENT      EXPECTED    Desmethylcitalopram is an expected metabolite of citalopram or the    enantiomeric form, escitalopram.    Acetaminophen                  PRESENT      EXPECTED   Propranolol                    PRESENT      EXPECTED  Drug Present not Declared for Prescription Verification   Oxazepam                       300          UNEXPECTED ng/mg creat   Temazepam                      357          UNEXPECTED ng/mg creat    Oxazepam and temazepam are expected metabolites of diazepam.    Oxazepam is also an expected metabolite of other benzodiazepine    drugs, including chlordiazepoxide, prazepam, clorazepate, halazepam,    and temazepam.  Oxazepam and temazepam are available as scheduled    prescription medications.  Drug Absent but Declared for Prescription Verification   Hydrocodone                    Not Detected UNEXPECTED ng/mg creat   Tizanidine                     Not Detected UNEXPECTED    Tizanidine, as indicated in the declared medication list, is not    always detected even when used as directed.     Amitriptyline                  Not Detected UNEXPECTED   Prochlorperazine               Not Detected UNEXPECTED   Salicylate  Not Detected UNEXPECTED    Aspirin, as indicated in the declared medication list, is not always    detected even when used as directed.  ==================================================================== Test                      Result    Flag   Units      Ref Range   Creatinine              14        LL     mg/dL      >=20 ==================================================================== Declared Medications:  The flagging and interpretation on this report are based on the  following declared medications.  Unexpected results may arise from  inaccuracies in the declared medications.   **Note: The testing scope of this panel includes these medications:   Amitriptyline (Elavil)  Butalbital (Fioricet)  Butalbital (Fiorinal)  Escitalopram (Lexapro)  Hydrocodone (Norco)  Lorazepam (Ativan)  Prochlorperazine (Compazine)  Propranolol (Inderal)   **Note: The testing scope of this panel does not include small to  moderate amounts of these reported medications:   Acetaminophen (Fioricet)  Acetaminophen (Norco)  Aspirin (Fiorinal)  Tizanidine (Zanaflex)   **Note: The testing scope of this panel does not include the  following reported medications:   Anastrozole (Arimidex)  Caffeine (Fioricet)  Caffeine (Fiorinal)  Chlorthalidone (Hygroton)  Cholecalciferol  Cholestyramine (Questran)  Dicyclomine (Bentyl)  Ezetimibe (Zetia)  Hydrocortisone  Melatonin  Ondansetron (Zofran)  Potassium (Klor-Con)  Rabeprazole (Aciphex)  Sucralfate (Carafate)  Valacyclovir (Valtrex)  Vitamin B12 ==================================================================== For clinical consultation, please call [phone]. ====================================================================      ROS  Constitutional: Denies any fever or  chills Gastrointestinal: No reported hemesis, hematochezia, vomiting, or acute GI distress Musculoskeletal: mid back pain Neurological: No reported episodes of acute onset apraxia, aphasia, dysarthria, agnosia, amnesia, paralysis, loss of coordination, or loss of consciousness  Medication Review  Cholecalciferol, Digestive Aids Mixture, Glutathione, HYDROcodone-acetaminophen, Hyoscyamine Sulfate, Loperamide HCl, Melatonin, NONFORMULARY OR COMPOUNDED ITEM, OVER THE COUNTER MEDICATION, Passion Flower-Valerian, Potassium, Probiotic Product, RABEprazole, Rhodiola rosea, Theanine, Vibegron, Vitamin B-12, amitriptyline, anastrozole, butalbital-acetaminophen-caffeine, chlorthalidone, cholestyramine, clonazePAM, dicyclomine, escitalopram, ezetimibe, labetalol, ondansetron, prochlorperazine, propranolol, sucralfate, testosterone cypionate, tizanidine, and valACYclovir  History Review  Allergy: Grant Young is allergic to ace inhibitors, fluoxetine, metoclopramide, nalbuphine, other, amoxicillin-pot clavulanate, doxazosin, duloxetine, penicillins, tamsulosin, trazodone and nefazodone, amlodipine, cinoxacin, ciprofloxacin, nebivolol, olanzapine, olmesartan, pregabalin, prostaglandins, thyroid hormones, zolpidem, duloxetine hcl, fluoxetine hcl, and phenytoin. Drug: Grant Young  reports previous drug use. Alcohol:  reports current alcohol use. Tobacco:  reports that he has never smoked. He has never used smokeless tobacco. Social: Grant Young  reports that he has never smoked. He has never used smokeless tobacco. He reports current alcohol use. He reports previous drug use. Medical:  has a past medical history of Anxiety, Chronic back pain, Chronic heart disease, Depression, GERD (gastroesophageal reflux disease), Headache, History of CT scan of brain (09/14/2018), History of depression, History of gastritis, History of headache, History of kidney stones, History of neuropathy, Hypertension, Insomnia, Kidney stone, Lumbar  radicular pain, Lyme disease, Myocardial infarction ([place]), Overactive bladder, PONV (postoperative nausea and vomiting), Skin cancer, basal cell, Sleep trouble, Spinal cord stimulator status, Squamous cell skin cancer, and Substance abuse ([place]). Surgical: Grant Young  has a past surgical history that includes Kidney stone surgery (09/14/1980); Kidney stone surgery (09/15/1995); Lithotripsy (09/14/2014); Lithotripsy (09/14/1996); Lithotripsy (09/14/1997); Gallbladder surgery (09/15/2015); Sigmoidoscopy (09/14/2017); Colonoscopy (09/15/2015); Cholecystectomy (2017); Shoulder surgery (Right, 1984); Back surgery; Spinal cord stimulator implant; Pain  pump implantation; Pain pump removal; Tonsillectomy; Foot surgery (Bilateral, 03/18/2020); Foot surgery (08/032021); Upper gi endoscopy; Cardiac catheterization; Colonoscopy with propofol (N/A, 10/03/2020); Esophagogastroduodenoscopy (egd) with propofol (N/A, 10/03/2020); Cataract extraction w/PHACO (Left, 01/14/2021); and Cataract extraction w/PHACO (Right, 01/28/2021). Family: family history includes Anxiety disorder in his daughter; Dementia in his mother; Heart disease in his father; Heart failure in his father; Hypertension in his father; Kidney Stones in his daughter; OCD in his daughter; Stroke in his father and mother.  Laboratory Chemistry Profile   Renal Lab Results  Component Value Date   BUN 1 (A) 05/20/2020   CREATININE 0.90 03/06/2020   GFRAA 101 05/20/2020   GFRNONAA 87 05/20/2020     Hepatic Lab Results  Component Value Date   AST 18 05/20/2020   ALT 24 05/20/2020   ALBUMIN 4.2 05/20/2020   ALKPHOS 149 (H) 09/16/2020     Electrolytes Lab Results  Component Value Date   NA 145 05/20/2020   K 3.2 (A) 05/20/2020   CL 102 05/20/2020   CALCIUM 9.8 05/20/2020     Bone Lab Results  Component Value Date   TESTOSTERONE 33.2 05/20/2020     Inflammation (CRP: Acute Phase) (ESR: Chronic Phase) No results found for: CRP, ESRSEDRATE,  LATICACIDVEN     Note: Above Lab results reviewed.  Recent Imaging Review  DG PAIN CLINIC C-ARM 1-60 MIN NO REPORT Fluoro was used, but no Radiologist interpretation will be provided.  Please refer to "NOTES" tab for provider progress note. Note: Reviewed        Physical Exam  General appearance: Well nourished, well developed, and well hydrated. In no apparent acute distress Mental status: Alert, oriented x 3 (person, place, & time)       Respiratory: No evidence of acute respiratory distress Eyes: PERLA Vitals: BP 134/83   Pulse 80   Temp (!) 97 F (36.1 C)   Ht 5' 9" (1.753 m)   Wt 146 lb (66.2 kg)   SpO2 99%   BMI 21.56 kg/m  BMI: Estimated body mass index is 21.56 kg/m as calculated from the following:   Height as of this encounter: 5' 9" (1.753 m).   Weight as of this encounter: 146 lb (66.2 kg). Ideal: Ideal body weight: 70.7 kg (155 lb 13.8 oz)  Lumbar Spine Area Exam  Skin & Axial Inspection: Well healed scar from previous spine surgery detected IPG present from SCS Alignment: Scoliosis detected Functional ROM: Pain restricted ROM       Stability: No instability detected Muscle Tone/Strength: Functionally intact. No obvious neuro-muscular anomalies detected. Sensory (Neurological): Improved low back pain after lumbar facet blocks  Gait & Posture Assessment  Ambulation: Unassisted Gait: Relatively normal for age and body habitus Posture: WNL   Lower Extremity Exam    Side: Right lower extremity  Side: Left lower extremity  Stability: No instability observed          Stability: No instability observed          Skin & Extremity Inspection: Skin color, temperature, and hair growth are WNL. No peripheral edema or cyanosis. No masses, redness, swelling, asymmetry, or associated skin lesions. No contractures.  Skin & Extremity Inspection: Skin color, temperature, and hair growth are WNL. No peripheral edema or cyanosis. No masses, redness, swelling, asymmetry, or  associated skin lesions. No contractures.  Functional ROM: Unrestricted ROM                  Functional ROM: Unrestricted ROM  Muscle Tone/Strength: Functionally intact. No obvious neuro-muscular anomalies detected.  Muscle Tone/Strength: Functionally intact. No obvious neuro-muscular anomalies detected.  Sensory (Neurological): Unimpaired        Sensory (Neurological): Unimpaired        DTR: Patellar: deferred today Achilles: deferred today Plantar: deferred today  DTR: Patellar: deferred today Achilles: deferred today Plantar: deferred today  Palpation: No palpable anomalies  Palpation: No palpable anomalies    Assessment   Status Diagnosis  Controlled Controlled Controlled 1. Lumbar facet arthropathy   2. Lumbar spondylosis   3. Chronic pain syndrome   4. 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 Bilateral occipital neuralgia   6. Muscle pain, myofascial      Plan of Care  Grant Young has a current medication list which includes the following long-term medication(s): amitriptyline, cholestyramine, dicyclomine, ezetimibe, rabeprazole, sucralfate, chlorthalidone, clonazepam, escitalopram, potassium, and testosterone cypionate.  Pharmacotherapy (Medications Ordered): Meds ordered this encounter  Medications  . HYDROcodone-acetaminophen (NORCO) 10-325 MG tablet    Sig: Take 1-2 tablets by mouth 2 (two) times daily as needed for severe pain.    Dispense:  60 tablet    Refill:  0  . HYDROcodone-acetaminophen (NORCO) 10-325 MG tablet    Sig: Take 1-2 tablets by mouth 2 (two) times daily as needed for severe pain.    Dispense:  60 tablet    Refill:  0   Orders:  Orders Placed This Encounter  Procedures  . LUMBAR FACET(MEDIAL BRANCH NERVE BLOCK) MBNB    Scheduling timeframe: (PRN procedure) Grant Young will call when needed. Clinical indication: Axial low back pain. Lumbosacral Spondylosis (M47.897).  Sedation: Usually  done with sedation. (May be done without sedation if so desired by patient.) Requirements: NPO x 8 hrs.; Driver; Stop blood thinners. Interval: No sooner than two weeks for diagnostic or therapeutic. No sooner than every other month for palliative.    Standing Status:   Standing    Number of Occurrences:   5    Standing Expiration Date:   02/06/2022    Scheduling Instructions:     Procedure: Lumbar facet block (AKA.: Lumbosacral medial branch nerve block)     Level: L3-4, L4-5,  Facets (L3, L4, L5, Medial Branch Nerves)     Laterality: Bilateral    Order Specific Question:   Where will this procedure be performed?    Answer:   [facility]   Follow-up plan:   Return in about 9 weeks (around 04/10/2021) for Medication Management, in person.     Status post Botox No. 1 on 01/01/2020 for migraine, lumbar TPI as well. SPG block 03/13/20. Bilateral GONB 04/01/20. 11/13/20 B/L L3,4,5 Facet blocks helpful repeat prn          Recent Visits Date Type Provider Dept  12/04/20 Office Visit [name], MD [facility]  11/13/20 Procedure visit [name], MD [facility]  Showing recent visits within past 90 days and meeting all other requirements Today's Visits Date Type Provider Dept  02/06/21 Office Visit [name], MD [facility]  Showing today's visits and meeting all other requirements Future Appointments Date Type Provider Dept  04/10/21 Appointment [name], MD [facility]  Showing future appointments within next 90 days and meeting all other requirements  I discussed the assessment and treatment plan with the patient. The patient was provided an opportunity to ask questions and all were answered. The patient agreed with the plan and demonstrated  an understanding of the instructions.  Patient advised to call back or seek an in-person evaluation if the symptoms or condition worsens.  Duration of encounter: 27mnutes.  Note by: BGillis Santa MD Date: 02/06/2021; Time: 2:27 PM

## 2021-02-07 ENCOUNTER — Encounter: Payer: Self-pay | Admitting: Internal Medicine

## 2021-02-07 NOTE — Patient Instructions (Signed)

## 2021-02-07 NOTE — Assessment & Plan Note (Signed)
He is not interested in neuropathic pain medication because he does not like the fact that it has on him Continue current pain meds at this time Will monitor

## 2021-02-07 NOTE — Assessment & Plan Note (Signed)
Discussed possibility of using Midodrine but he wants to hold off at this time Will see if improves with treatment of anxiety with Clonazepam Will monitor

## 2021-02-07 NOTE — Assessment & Plan Note (Signed)
Controlled on Chlorthalidone, Labetalol Chlorthalidone refilled today Kidney function Will monitor

## 2021-02-07 NOTE — Assessment & Plan Note (Signed)
Continue Escitalopram We will trial Clonazepam 1 mg twice daily Will need CSA at next visit Support offered

## 2021-02-08 ENCOUNTER — Other Ambulatory Visit: Payer: Self-pay | Admitting: Internal Medicine

## 2021-02-08 NOTE — Telephone Encounter (Signed)
Requested medication (s) are due for refill today: no  Requested medication (s) are on the active medication list: hx med  Last refill:  02/05/21    Future visit scheduled: yes  Notes to clinic:  Hx provider ( was prescribed by [name] )   Requested Prescriptions  Pending Prescriptions Disp Refills   labetalol ([place]) 100 MG tablet [Pharmacy Med Name: LABETALOL HCL 100 MG TABLET] 60 tablet     Sig: TAKE 1 TABLET BY MOUTH TWICE A DAY      Cardiovascular:  Beta Blockers Passed - 02/08/2021  4:51 PM      Passed - Last BP in normal range    BP Readings from Last 1 Encounters:  02/06/21 134/83          Passed - Last Heart Rate in normal range    Pulse Readings from Last 1 Encounters:  02/06/21 80          Passed - Valid encounter within last 6 months    Recent Outpatient Visits           3 days ago Essential hypertension   [place], NP       Future Appointments             In 1 month Vilas, [name], NP [facility], [place]   In 9 months Diamantina [place], Herbert Seta, [place]

## 2021-02-17 ENCOUNTER — Encounter: Payer: Self-pay | Admitting: Internal Medicine

## 2021-03-05 ENCOUNTER — Encounter: Payer: Self-pay | Admitting: Internal Medicine

## 2021-03-06 ENCOUNTER — Encounter: Payer: Self-pay | Admitting: Internal Medicine

## 2021-03-06 MED ORDER — DIAZEPAM 2 MG PO TABS: 2.0000 mg | ORAL_TABLET | Freq: Two times a day (BID) | ORAL | 0 refills | Status: DC | PRN

## 2021-03-07 MED ORDER — DIAZEPAM 5 MG PO TABS: 5.0000 mg | ORAL_TABLET | Freq: Two times a day (BID) | ORAL | 0 refills | Status: DC | PRN

## 2021-03-11 ENCOUNTER — Ambulatory Visit: Payer: Medicare Other | Admitting: Internal Medicine

## 2021-03-30 ENCOUNTER — Encounter: Payer: Self-pay | Admitting: Internal Medicine

## 2021-04-01 NOTE — Telephone Encounter (Signed)
Ok to change to virtual.  

## 2021-04-03 ENCOUNTER — Telehealth (INDEPENDENT_AMBULATORY_CARE_PROVIDER_SITE_OTHER): Payer: Medicare Other | Admitting: Internal Medicine

## 2021-04-03 ENCOUNTER — Other Ambulatory Visit: Payer: Self-pay

## 2021-04-03 ENCOUNTER — Encounter: Payer: Self-pay | Admitting: Internal Medicine

## 2021-04-03 DIAGNOSIS — R251 Tremor, unspecified: Secondary | ICD-10-CM

## 2021-04-03 DIAGNOSIS — F419 Anxiety disorder, unspecified: Secondary | ICD-10-CM

## 2021-04-03 DIAGNOSIS — G43701 Chronic migraine without aura, not intractable, with status migrainosus: Secondary | ICD-10-CM

## 2021-04-03 DIAGNOSIS — F32A Depression, unspecified: Secondary | ICD-10-CM

## 2021-04-03 DIAGNOSIS — K219 Gastro-esophageal reflux disease without esophagitis: Secondary | ICD-10-CM

## 2021-04-03 DIAGNOSIS — I10 Essential (primary) hypertension: Secondary | ICD-10-CM

## 2021-04-03 MED ORDER — DIAZEPAM 5 MG PO TABS: ORAL_TABLET | ORAL | 0 refills | Status: DC

## 2021-04-03 MED ORDER — PROPRANOLOL HCL 40 MG PO TABS: 40.0000 mg | ORAL_TABLET | Freq: Two times a day (BID) | ORAL | 1 refills | Status: DC

## 2021-04-03 MED ORDER — AMITRIPTYLINE HCL 10 MG PO TABS: 30.0000 mg | ORAL_TABLET | Freq: Every day | ORAL | 1 refills | Status: DC

## 2021-04-03 MED ORDER — RABEPRAZOLE SODIUM 20 MG PO TBEC: 20.0000 mg | DELAYED_RELEASE_TABLET | Freq: Two times a day (BID) | ORAL | 1 refills | Status: DC

## 2021-04-03 NOTE — Patient Instructions (Signed)
Tremor A tremor is trembling or shaking that you cannot control. Most tremors affect the hands or arms. Tremors can also affect the head, vocal cords, face, and other parts of the body. There are many types of tremors. Common types include: Essential tremor. These usually occur in people older than 40. It may run in families and can happen in otherwise healthy people. Resting tremor. These occur when the muscles are at rest, such as when your hands are resting in your lap. People with Parkinson's disease often have resting tremors. Postural tremor. These occur when you try to hold a pose, such as keeping your hands outstretched. Kinetic tremor. These occur during purposeful movement, such as trying to touch a finger to your nose. Task-specific tremor. These may occur when you perform certain tasks such as writing, speaking, or standing. Psychogenic tremor. These dramatically lessen or disappear when you are distracted. They can happen in people of all ages. Some types of tremors have no known cause. Tremors can also be a symptom of nervous system problems (neurological disorders) that may occur with aging. Some tremors go away with treatment, while othersdo not. Follow these instructions at home: Lifestyle     Limit alcohol intake to no more than 1 drink a day for nonpregnant women and 2 drinks a day for men. One drink equals 12 oz of beer, 5 oz of wine, or 1 oz of hard liquor. Do not use any products that contain nicotine or tobacco, such as cigarettes and e-cigarettes. If you need help quitting, ask your health care provider. Avoid extreme heat and extreme cold. Limit your caffeine intake, as told by your health care provider. Try to get 8 hours of sleep each night. Find ways to manage your stress, such as meditation or yoga. General instructions Take over-the-counter and prescription medicines only as told by your health care provider. Keep all follow-up visits as told by your health care  provider. This is important. Contact a health care provider if you: Develop a tremor after starting a new medicine. Have a tremor along with other symptoms such as: Numbness. Tingling. Pain. Weakness. Notice that your tremor gets worse. Notice that your tremor interferes with your day-to-day life. Summary A tremor is trembling or shaking that you cannot control. Most tremors affect the hands or arms. Some types of tremors have no known cause. Others may be a symptom of nervous system problems (neurological disorders). Make sure you discuss any tremors you have with your health care provider. This information is not intended to replace advice given to you by your health care provider. Make sure you discuss any questions you have with your healthcare provider. Document Revised: 05/24/2020 Document Reviewed: 05/24/2020 Elsevier Patient Education  2022 [employer].

## 2021-04-03 NOTE — Assessment & Plan Note (Signed)
D/c Labetalol Propranolol refilled today

## 2021-04-03 NOTE — Assessment & Plan Note (Signed)
Will change Valium to 5 mg in am, 10 mg in PM

## 2021-04-03 NOTE — Assessment & Plan Note (Signed)
Amitriptyline refilled Continue Fioricet as needed He will continue to see neurology, will follow

## 2021-04-03 NOTE — Assessment & Plan Note (Signed)
Try to avoid foods that trigger your reflux Aciphex refilled today

## 2021-04-03 NOTE — Progress Notes (Signed)
Virtual Visit via Video Note  I connected with Grant Young on 04/03/21 at 11:00 AM EDT by a video enabled telemedicine application and verified that I am speaking with the correct person using two identifiers.  Location: Patient: Home Provider: Office  Person's participating in this video call: Grant Silversmith NP and Grant Young.   I discussed the limitations of evaluation and management by telemedicine and the availability of in person appointments. The patient expressed understanding and agreed to proceed.  History of Present Illness:  Pt would like to follow up on HTN and essential tremor. He was switched from Propranolol to Labetalol for better BP management. He would like to switch back to Propranolol at this time.  Migraines: Managed on Amitriptyline and Fioricet. He would like me to start prescribing this for him. He does follow with neurology.  GERD: He is not sure what exactly triggers this. Managed on Aciphex. He would like me to start prescribing this for him.  Anxiety and Depression: He reports the Clonazepam was too strong and the Diazepam is not strong enough. He thinks he needs 5 mg in the am and 10 mg in the PM. He is taking Esctalopram as well. He thinks this will also help with his RLS.  He also reports he tested positive for Covid 6 days ago. He reports he had a mild headache, hoarsenss and cough. He reports fever and body aches. He reports his symptoms resolved within a few days of symptoms onset.   Past Medical History:  Diagnosis Date   Anxiety    Per New Patient Packet   Chronic back pain    Per New Patient Packet   Chronic heart disease    Per New Patient Packet   Depression    GERD (gastroesophageal reflux disease)    Headache    History of CT scan of brain 09/14/2018   Per New Patient Packet   History of depression    Per New Patient Packet   History of gastritis    Per New Patient Packet   History of headache    Per New Patient Packet   History  of kidney stones    Per New Patient Packet   History of neuropathy    Per New Patient Packet   Hypertension    Per New Patient Packet   Insomnia    Kidney stone    Lumbar radicular pain    Per New Patient Packet   Lyme disease    Per New Patient Packet   Myocardial infarction [facility])    Overactive bladder    PONV (postoperative nausea and vomiting)    Skin cancer, basal cell    Sleep trouble    Per New Patient Packet   Spinal cord stimulator status    01/08/21 - not currently using.   Squamous cell skin cancer    Substance abuse (HCC)     Current Outpatient Medications  Medication Sig Dispense Refill   amitriptyline (ELAVIL) 10 MG tablet Take 30 mg by mouth at bedtime.     butalbital-acetaminophen-caffeine (FIORICET) 50-325-40 MG tablet Take by mouth.     chlorthalidone (HYGROTON) 25 MG tablet Take 1 tablet (25 mg total) by mouth daily. 90 tablet 0   Cholecalciferol 125 MCG (5000 UT) TABS Take by mouth.     Cyanocobalamin (VITAMIN B-12) 5000 MCG LOZG Take 1 lozenge by mouth daily.      diazepam (VALIUM) 5 MG tablet Take 1 tablet (5 mg total) by mouth every 12 (  twelve) hours as needed for anxiety. 60 tablet 0   dicyclomine (BENTYL) 20 MG tablet Take 20 mg by mouth 4 (four) times daily.     escitalopram (LEXAPRO) 20 MG tablet Take 1 tablet (20 mg total) by mouth daily. 90 tablet 1   ezetimibe (ZETIA) 10 MG tablet Take 1 tablet (10 mg total) by mouth daily. 90 tablet 1   GLUTATHIONE PO Take 1 tablet by mouth daily.     HYDROcodone-acetaminophen (NORCO) 10-325 MG tablet Take 1-2 tablets by mouth 2 (two) times daily as needed for severe pain. 60 tablet 0   Loperamide HCl (IMODIUM PO) Take by mouth as needed.      Melatonin 10 MG TABS Take 10 mg by mouth at bedtime.      NONFORMULARY OR COMPOUNDED ITEM Bi-Mix Injection  Papaverine HCI/Phentolamine Mesylate 150mg /55mg /Vial Inject 6ml prior to intercourse   For intracavernosal use only 5 each 11   ondansetron (ZOFRAN) 4 MG tablet  Take 4 mg by mouth as needed for nausea or vomiting.      OVER THE COUNTER MEDICATION G I Detox  - 2x/week Gastromend HP Daily Glutaloemine Daily Lithium Oronate Daily GABA Liposomal Daily Fungus Eliminator     Potassium 99 MG TABS Take by mouth 3 (three) times a week. K-Zyme     Probiotic Product (PROBIOTIC DAILY PO) Take by mouth.     RABEprazole (ACIPHEX) 20 MG tablet TAKE 1 TABLET BY MOUTH TWICE A DAY 180 tablet 1   Rhodiola rosea (RHODIOLA PO) Take by mouth.     sucralfate (CARAFATE) 1 g tablet Take 1 g by mouth 4 (four) times daily as needed.      testosterone cypionate (DEPOTESTOSTERONE CYPIONATE) 200 MG/ML injection Inject 60 mg into the muscle every 14 (fourteen) days.     Theanine 50 MG TBDP Take by mouth at bedtime.     tizanidine (ZANAFLEX) 2 MG capsule Take 1 capsule (2 mg total) by mouth every 12 (twelve) hours as needed. 180 capsule 0   valACYclovir (VALTREX) 1000 MG tablet TAKE 2 TABLETS BY MOUTH AND REPEAT IN 12 HOURS FOR COLD SORE 4 tablet 1   Vibegron (GEMTESA) 75 MG TABS Take 75 mg by mouth daily. 90 tablet 3   cholestyramine (QUESTRAN) 4 g packet Take 1 packet (4 g total) by mouth 3 (three) times daily. 270 packet 3   Digestive Aids Mixture (DIGESTION GB PO) Take 1 capsule by mouth daily.  (Patient not taking: Reported on 04/03/2021)     prochlorperazine (COMPAZINE) 10 MG tablet Take 10 mg by mouth as needed. (Patient not taking: Reported on 04/03/2021)     No current facility-administered medications for this visit.    Allergies  Allergen Reactions   Ace Inhibitors     Other reaction(s): Cough   Fluoxetine Anxiety    "made me fall asleep" per pt "bad headaches and "makes  Me  Crazy" historical allergy noted in [employer] "made me fall asleep" per pt "bad headaches and "makes  Me  Crazy" Per New Patient Packet.     Metoclopramide     Other reaction(s): Other (See Comments), Other (See Comments), Unknown Tardive Dyskinesia  historical allergy noted in  [employer] Tardive Dyskinesia  Per New Patient Packet.   Nalbuphine     Used Post Back surgery- Anesthesiologist Error. Patient had Narcotic Withdraw. Per New Patient Packet.    Other     Other reaction(s): Other (See Comments) Altered mental status in combo with narcotics at previous hospitalization -  Full Withdrawal Symptoms Other reaction(s): Rash   Amoxicillin-Pot Clavulanate Nausea Only    Per New Patient Packet.   Doxazosin Rash    Other reaction(s): Other - See Comments, Rash UNKNOWN REACTION UNKNOWN REACTION    Duloxetine Nausea Only    Per New Patient Packet.    Penicillins Nausea Only    Per New Patient Packet.   Tamsulosin Itching and Anxiety    Restless, Flushing, Heavy Chest, Itching, Hyperactive mood and Anxiety. Unable to handle side effects. Per New Patient Packet.     Trazodone And Nefazodone Itching, Anxiety and Rash    Headache. "INCREASED MY ANXIETY AND HEARTRATE" Flushing, tachycardia "INCREASED MY ANXIETY AND HEARTRATE" Per New Patient Packet.    Amlodipine     Shaking, unsure of reaction. Per New Patient Packet.     Cinoxacin     GI Intolerance, and Dizziness. Per New Patient Packet.    Ciprofloxacin     Other reaction(s): Unknown   Nebivolol     Other reaction(s): Unknown   Olanzapine     Headache and unable to sleep for 3 nights. Per New Patient Packet.    Olmesartan     Other reaction(s): Unknown   Pregabalin     Confusion, Lack of concentration, dizziness, and likely drowsiness. Per New Patient Packet.     Prostaglandins     Other reaction(s): Other (See Comments) Intolerance   Thyroid Hormones     Other reaction(s): Other (See Comments) Thyroid (Nature Thyroid) contraindicated with some of your other medications.   Zolpidem     Nightmares, Ineffective after 2 days. Per New Patient Packet.    Duloxetine Hcl     Other reaction(s): Rash   Fluoxetine Hcl     Other reaction(s): Rash   Phenytoin Anxiety    Hyperactivity, and Ineffective.  Per New Patient Packet.     Family History  Problem Relation Age of Onset   Heart disease Father    Heart failure Father    Hypertension Father    Stroke Father    Stroke Mother    Dementia Mother    Kidney Stones Daughter    Anxiety disorder Daughter    OCD Daughter     Social History   Socioeconomic History   Marital status: Married    Spouse name: Not on file   Number of children: Not on file   Years of education: Not on file   Highest education level: Not on file  Occupational History   Not on file  Tobacco Use   Smoking status: Never   Smokeless tobacco: Never  Vaping Use   Vaping Use: Never used  Substance and Sexual Activity   Alcohol use: Yes    Comment: 1 Drink a Month, socially   Drug use: Not Currently   Sexual activity: Yes    Birth control/protection: None  Other Topics Concern   Not on file  Social History Narrative   Tobacco use, amount per day now: None   Past tobacco use, amount per day: None   How many years did you use tobacco: 0   Alcohol use (drinks per week): 0-1 Month   Diet:   Do you drink/eat things with caffeine: Occasionally ( Hot Chocolate and maybe 1/4 of 16oz Pepsi 2-3 times a week.   Marital status: Married  What year were you married? 1970   Do you live in a house, apartment, assisted living, condo, trailer, etc.? House   Is it one or more stories? One   How many persons live in your home? 2   Do you have pets in your home?( please list)  No   Highest Level of education completed: Masters   Current or past profession: Intensive Transport planner for [employer]   Do you exercise? Yes                                    Type and how often? [place], Recumbent Bike, 3 times a week. Try to get a 1.2 mile walk at least 3 times a week or more.     Do you have a living will? Yes   Do you have a DNR form?  No                                 If not, do you want to discuss one?   Do you have  signed POA/HPOA forms? Yes                       If so, please bring to you appointment    Do you have any difficulty bathing or dressing yourself? No    Do you have difficulty preparing food or eating? No   Do you have difficulty managing your medications? No   Do you have any difficulty managing your finances? No   Do you have any difficulty affording your medications? No         Social Determinants of Radio broadcast assistant Strain: Not on file  Food Insecurity: Not on file  Transportation Needs: Not on file  Physical Activity: Not on file  Stress: Not on file  Social Connections: Not on file  Intimate Partner Violence: Not on file     Constitutional: Pt reports intermittent headaches. Denies fever, malaise, fatigue, or abrupt weight changes.  Respiratory: Denies difficulty breathing, shortness of breath, cough or sputum production.   Cardiovascular: Denies chest pain, chest tightness, palpitations or swelling in the hands or feet.  Gastrointestinal: Pt reports intermittent reflux. Denies abdominal pain, bloating, constipation, diarrhea or blood in the stool.  Musculoskeletal: Denies decrease in range of motion, difficulty with gait, muscle pain or joint pain and swelling.  Skin: Denies redness, rashes, lesions or ulcercations.  Neurological: Pt reports essential tremor, restless legs. Denies dizziness, difficulty with memory, difficulty with speech or problems with balance and coordination.  Psych: Pt has a history of anxiety. Denies depression, SI/HI.  No other specific complaints in a complete review of systems (except as listed in HPI above).    Observations/Objective:  Wt Readings from Last 3 Encounters:  02/06/21 146 lb (66.2 kg)  02/05/21 146 lb 9.6 oz (66.5 kg)  01/28/21 146 lb (66.2 kg)    General: Appears their stated age, in NAD. HEENT: Head: normal shape and size; Pulmonary/Chest: Normal effort. No respiratory distress.  Neurological: Alert and oriented.   Psychiatric: Mood and affect normal. Behavior is normal. Judgment and thought content normal.     BMET    Component Value Date/Time   NA 145 05/20/2020 0000   K 3.2 (A) 05/20/2020 0000   CL 102 05/20/2020 0000   CO2  34 (A) 05/20/2020 0000   BUN 1 (A) 05/20/2020 0000   CREATININE 0.90 03/06/2020 1134   CALCIUM 9.8 05/20/2020 0000   GFRNONAA 87 05/20/2020 0000   GFRAA 101 05/20/2020 0000    Lipid Panel     Component Value Date/Time   CHOL 170 05/16/2020 0000   TRIG 59 05/16/2020 0000   HDL 56 05/16/2020 0000   LDLCALC 100 05/16/2020 0000    CBC    Component Value Date/Time   WBC 6.2 05/20/2020 0000   RBC 5.21 (A) 05/20/2020 0000   HGB 15.6 05/20/2020 0000   HCT 47 05/20/2020 0000   PLT 199 05/20/2020 0000    Hgb A1C Lab Results  Component Value Date   HGBA1C 5.1 10/17/2019       Assessment and Plan:   Follow Up Instructions:    I discussed the assessment and treatment plan with the patient. The patient was provided an opportunity to ask questions and all were answered. The patient agreed with the plan and demonstrated an understanding of the instructions.   The patient was advised to call back or seek an in-person evaluation if the symptoms worsen or if the condition fails to improve as anticipated.   Grant Silversmith, NP

## 2021-04-03 NOTE — Assessment & Plan Note (Signed)
Will d/c Labetalol RX for Propranolol 40 mg BID Reinforced DASH diet Will monitor

## 2021-04-10 ENCOUNTER — Ambulatory Visit
Payer: BLUE CROSS/BLUE SHIELD | Attending: Student in an Organized Health Care Education/Training Program | Admitting: Student in an Organized Health Care Education/Training Program

## 2021-04-10 ENCOUNTER — Other Ambulatory Visit: Payer: Self-pay

## 2021-04-10 ENCOUNTER — Encounter: Payer: Self-pay | Admitting: Student in an Organized Health Care Education/Training Program

## 2021-04-10 DIAGNOSIS — G894 Chronic pain syndrome: Secondary | ICD-10-CM | POA: Diagnosis not present

## 2021-04-10 MED ORDER — HYDROCODONE-ACETAMINOPHEN 10-325 MG PO TABS: 1.0000 | ORAL_TABLET | Freq: Two times a day (BID) | ORAL | 0 refills | Status: DC | PRN

## 2021-04-10 NOTE — Progress Notes (Signed)
Patient: Grant Young  Service Category: E/M  Provider: [name], MD  DOB: [birth year]  DOS: 04/10/2021  Location: Office  MRN: [id number]  Setting: Ambulatory outpatient  Referring Provider: [name], NP  Type: Established Patient  Specialty: Interventional Pain Management  PCP: [name], NP  Location: Home  Delivery: TeleHealth     Virtual Encounter - Pain Management PROVIDER NOTE: Information contained herein reflects review and annotations entered in association with encounter. Interpretation of such information and data should be left to medically-trained personnel. Information provided to patient can be located elsewhere in the medical record under "Patient Instructions". Document created using STT-dictation technology, any transcriptional errors that may result from process are unintentional.    Contact & Pharmacy Preferred: [phone] Home: [phone] (home) Mobile: [phone] (mobile) E-mail: [email or web address]  [id number] [address]Phone: [phone]: [phone] [address] [place] [address]Phone: [phone]: [phone]  Pre-screening  Mr. GZarconeoffered "in-person" vs "virtual" encounter. He indicated preferring virtual for this encounter.   Reason COVID-19*  Social distancing based on CDC and AMA recommendations.   I contacted CSabino Gasseron 04/10/2021 via video conference.      I clearly identified myself as BGillis Santa MD. I verified that I was speaking with the correct person using two identifiers (Name: CVALENTINE BARNEY and date of birth: [birth year].  Consent I sought verbal advanced consent from CSabino Gasserfor virtual visit interactions. I informed Mr. GGoettingof possible security and privacy concerns, risks, and limitations associated with providing "not-in-person"  medical evaluation and management services. I also informed Mr. GKielbasaof the availability of "in-person" appointments. Finally, I informed him that there would be a charge for the virtual visit and that he could be  personally, fully or partially, financially responsible for it. Mr. GMontsexpressed understanding and agreed to proceed.   Historic Elements   Mr. CRECO SHONKis a 75y.o. year old, male patient evaluated today after our last contact on 02/06/2021. Mr. GCarfagno has a past medical history of Anxiety, Chronic back pain, Chronic heart disease, Depression, GERD (gastroesophageal reflux disease), Headache, History of CT scan of brain (09/14/2018), History of depression, History of gastritis, History of headache, History of kidney stones, History of neuropathy, Hypertension, Insomnia, Kidney stone, Lumbar radicular pain, Lyme disease, Myocardial infarction (HCrum, Overactive bladder, PONV (postoperative nausea and vomiting), Skin cancer, basal cell, Sleep trouble, Spinal cord stimulator status, Squamous cell skin cancer, and Substance abuse (HLyman. He also  has a past surgical history that includes Kidney stone surgery (09/14/1980); Kidney stone surgery (09/15/1995); Lithotripsy (09/14/2014); Lithotripsy (09/14/1996); Lithotripsy (09/14/1997); Gallbladder surgery (09/15/2015); Sigmoidoscopy (09/14/2017); Colonoscopy (09/15/2015); Cholecystectomy (2017); Shoulder surgery (Right, 1984); Back surgery; Spinal cord stimulator implant; Pain pump implantation; Pain pump removal; Tonsillectomy; Foot surgery (Bilateral, 03/18/2020); Foot surgery (08/032021); Upper gi endoscopy; Cardiac catheterization; Colonoscopy with propofol (N/A, 10/03/2020); Esophagogastroduodenoscopy (egd) with propofol (N/A, 10/03/2020); Cataract extraction w/PHACO (Left, 01/14/2021); and Cataract extraction w/PHACO (Right, 01/28/2021). Mr. GClinehas a current medication list which includes the following prescription(s): amitriptyline,  butalbital-acetaminophen-caffeine, chlorthalidone, cholecalciferol, cholestyramine, vitamin b-12, diazepam, dicyclomine, digestive aids mixture, escitalopram, ezetimibe, glutathione, [START ON 04/21/2021] hydrocodone-acetaminophen, [START ON 05/21/2021] hydrocodone-acetaminophen, loperamide hcl, melatonin, NONFORMULARY OR COMPOUNDED ITEM, ondansetron, OVER THE COUNTER MEDICATION, potassium, probiotic product, propranolol, rabeprazole, rhodiola rosea, sucralfate, testosterone cypionate, theanine, tizanidine, valacyclovir,  and gemtesa. He  reports that he has never smoked. He has never used smokeless tobacco. He reports current alcohol use. He reports previous drug use. Mr. Carreon is allergic to ace inhibitors, fluoxetine, metoclopramide, nalbuphine, other, amoxicillin-pot clavulanate, doxazosin, duloxetine, penicillins, tamsulosin, trazodone and nefazodone, amlodipine, cinoxacin, ciprofloxacin, nebivolol, olanzapine, olmesartan, pregabalin, prostaglandins, thyroid hormones, zolpidem, duloxetine hcl, fluoxetine hcl, and phenytoin.   HPI  Today, he is being contacted for medication management.  VV visit secondary to [place] -19 Patients symptoms are improving although he states for first couple of days, he was not feeling well Patient's wife positive for Covid 19 as well Refill Hydrocodone since patient unable to make F2F visit  Pharmacotherapy Assessment   Analgesic: Hydrocodone 10 mg BID PRN    Monitoring: [place] PMP: PDMP reviewed during this encounter.       Pharmacotherapy: No side-effects or adverse reactions reported. Compliance: No problems identified. Effectiveness: Clinically acceptable. Plan: Refer to "POC". UDS:  Summary  Date Value Ref Range Status  09/17/2020 Note  Final    Comment:    ==================================================================== Compliance Drug Analysis, Ur ==================================================================== Specimen Alert Note: Urinary creatinine  is low; ability to detect some drugs may be compromised. Interpret results with caution. (Creatinine) ==================================================================== Test                             Result       Flag       Units  Drug Present and Declared for Prescription Verification   Lorazepam                      1107         EXPECTED   ng/mg creat    Source of lorazepam is a scheduled prescription medication.    Butalbital                     PRESENT      EXPECTED   Citalopram                     PRESENT      EXPECTED   Desmethylcitalopram            PRESENT      EXPECTED    Desmethylcitalopram is an expected metabolite of citalopram or the    enantiomeric form, escitalopram.    Acetaminophen                  PRESENT      EXPECTED   Propranolol                    PRESENT      EXPECTED  Drug Present not Declared for Prescription Verification   Oxazepam                       300          UNEXPECTED ng/mg creat   Temazepam                      357          UNEXPECTED ng/mg creat    Oxazepam and temazepam are expected metabolites of diazepam.    Oxazepam is also an expected metabolite of other benzodiazepine    drugs, including chlordiazepoxide, prazepam, clorazepate, halazepam,    and temazepam.  Oxazepam and temazepam are available as scheduled  prescription medications.  Drug Absent but Declared for Prescription Verification   Hydrocodone                    Not Detected UNEXPECTED ng/mg creat   Tizanidine                     Not Detected UNEXPECTED    Tizanidine, as indicated in the declared medication list, is not    always detected even when used as directed.    Amitriptyline                  Not Detected UNEXPECTED   Prochlorperazine               Not Detected UNEXPECTED   Salicylate                     Not Detected UNEXPECTED    Aspirin, as indicated in the declared medication list, is not always    detected even when used as  directed.  ==================================================================== Test                      Result    Flag   Units      Ref Range   Creatinine              14        LL     mg/dL      >=20 ==================================================================== Declared Medications:  The flagging and interpretation on this report are based on the  following declared medications.  Unexpected results may arise from  inaccuracies in the declared medications.   **Note: The testing scope of this panel includes these medications:   Amitriptyline (Elavil)  Butalbital (Fioricet)  Butalbital (Fiorinal)  Escitalopram (Lexapro)  Hydrocodone (Norco)  Lorazepam (Ativan)  Prochlorperazine (Compazine)  Propranolol (Inderal)   **Note: The testing scope of this panel does not include small to  moderate amounts of these reported medications:   Acetaminophen (Fioricet)  Acetaminophen (Norco)  Aspirin (Fiorinal)  Tizanidine (Zanaflex)   **Note: The testing scope of this panel does not include the  following reported medications:   Anastrozole (Arimidex)  Caffeine (Fioricet)  Caffeine (Fiorinal)  Chlorthalidone (Hygroton)  Cholecalciferol  Cholestyramine (Questran)  Dicyclomine (Bentyl)  Ezetimibe (Zetia)  Hydrocortisone  Melatonin  Ondansetron (Zofran)  Potassium (Klor-Con)  Rabeprazole (Aciphex)  Sucralfate (Carafate)  Valacyclovir (Valtrex)  Vitamin B12 ==================================================================== For clinical consultation, please call [phone]. ====================================================================      Laboratory Chemistry Profile   Renal Lab Results  Component Value Date   BUN 1 (A) 05/20/2020   CREATININE 0.90 03/06/2020   GFRAA 101 05/20/2020   GFRNONAA 87 05/20/2020    Hepatic Lab Results  Component Value Date   AST 18 05/20/2020   ALT 24 05/20/2020   ALBUMIN 4.2 05/20/2020   ALKPHOS 149 (H) 09/16/2020     Electrolytes Lab Results  Component Value Date   NA 145 05/20/2020   K 3.2 (A) 05/20/2020   CL 102 05/20/2020   CALCIUM 9.8 05/20/2020    Bone Lab Results  Component Value Date   TESTOSTERONE 33.2 05/20/2020    Inflammation (CRP: Acute Phase) (ESR: Chronic Phase) No results found for: CRP, ESRSEDRATE, LATICACIDVEN       Note: Above Lab results reviewed.   Assessment  The encounter diagnosis was Chronic pain syndrome.  Plan of Care  Problem-specific:  No problem-specific Assessment & Plan notes found for this  encounter.  Mr. THOS MATSUMOTO has a current medication list which includes the following long-term medication(s): amitriptyline, chlorthalidone, cholestyramine, dicyclomine, escitalopram, ezetimibe, potassium, propranolol, rabeprazole, sucralfate, and testosterone cypionate.  Pharmacotherapy (Medications Ordered): Meds ordered this encounter  Medications   HYDROcodone-acetaminophen (NORCO) 10-325 MG tablet    Sig: Take 1-2 tablets by mouth 2 (two) times daily as needed for severe pain.    Dispense:  60 tablet    Refill:  0   HYDROcodone-acetaminophen (NORCO) 10-325 MG tablet    Sig: Take 1-2 tablets by mouth 2 (two) times daily as needed for severe pain.    Dispense:  60 tablet    Refill:  0    Follow-up plan:   Return in about 2 months (around 06/17/2021) for Medication Management, in person.     Status post Botox No. 1 on 01/01/2020 for migraine, lumbar TPI as well. SPG block 03/13/20. Bilateral GONB 04/01/20. 11/13/20 B/L L3,4,5 Facet blocks helpful repeat prn           Recent Visits Date Type Provider Dept  02/06/21 Office Visit [name], MD [facility]  Showing recent visits within past 90 days and meeting all other requirements Today's Visits Date Type Provider Dept  04/10/21 Telemedicine [name], MD [facility]  Showing today's visits and meeting all other requirements Future Appointments No visits were found meeting these  conditions. Showing future appointments within next 90 days and meeting all other requirements I discussed the assessment and treatment plan with the patient. The patient was provided an opportunity to ask questions and all were answered. The patient agreed with the plan and demonstrated an understanding of the instructions.  Patient advised to call back or seek an in-person evaluation if the symptoms or condition worsens.  Duration of encounter: 108mnutes.  Note by: BGillis Santa MD Date: 04/10/2021; Time: 12:10 PM

## 2021-04-14 ENCOUNTER — Telehealth: Payer: Self-pay

## 2021-04-14 NOTE — Telephone Encounter (Signed)
Copied from [place] [phone]. Topic: Appointment Scheduling - Scheduling Inquiry for Clinic >> Apr 14, 2021  2:28 PM Bayard Beaver wrote: Reason for CRM: patient needs to schedule well visit. He also states he was told by Dr Garnette Gunner that he would have a complete physical done. I told him he would need to discuss that with Dr Garnette Gunner, since it shows he only has medicare for a well visit. >> Apr 14, 2021  4:07 PM Ihor Gully, CMA wrote: Called patient and LM to call back to make appt.  He can not have a "Physical"  .  Traditional medicare does not cover Physicals.  She would need a medicare wellness with [employer]ene Fare.

## 2021-04-15 NOTE — Telephone Encounter (Signed)
Please schedule Medicare Wellness. I do a full exam and panel of blood work at those visits anyway

## 2021-05-05 ENCOUNTER — Other Ambulatory Visit: Payer: Self-pay

## 2021-05-05 ENCOUNTER — Encounter: Payer: Self-pay | Admitting: Internal Medicine

## 2021-05-05 ENCOUNTER — Ambulatory Visit (INDEPENDENT_AMBULATORY_CARE_PROVIDER_SITE_OTHER): Payer: Medicare Other | Admitting: Internal Medicine

## 2021-05-05 DIAGNOSIS — M545 Low back pain, unspecified: Secondary | ICD-10-CM | POA: Diagnosis not present

## 2021-05-05 DIAGNOSIS — G8929 Other chronic pain: Secondary | ICD-10-CM

## 2021-05-05 MED ORDER — LORAZEPAM 1 MG PO TABS: 1.0000 mg | ORAL_TABLET | Freq: Two times a day (BID) | ORAL | 0 refills | Status: DC | PRN

## 2021-05-05 MED ORDER — TIZANIDINE HCL 2 MG PO CAPS: 2.0000 mg | ORAL_CAPSULE | Freq: Two times a day (BID) | ORAL | 1 refills | Status: DC | PRN

## 2021-05-05 MED ORDER — LORAZEPAM 1 MG PO TABS: 1.0000 mg | ORAL_TABLET | Freq: Two times a day (BID) | ORAL | 0 refills | Status: DC

## 2021-05-05 NOTE — Patient Instructions (Signed)

## 2021-05-05 NOTE — Addendum Note (Signed)
Addended by: [name] on: 05/05/2021 01:57 PM   Modules accepted: Orders, Level of Service

## 2021-05-05 NOTE — Progress Notes (Signed)
HPI:  Patient presents to clinic today for subsequent annual Medicare wellness exam.  Past Medical History:  Diagnosis Date   Anxiety    Per New Patient Packet   Chronic back pain    Per New Patient Packet   Chronic heart disease    Per New Patient Packet   Depression    GERD (gastroesophageal reflux disease)    Headache    History of CT scan of brain 09/14/2018   Per New Patient Packet   History of depression    Per New Patient Packet   History of gastritis    Per New Patient Packet   History of headache    Per New Patient Packet   History of kidney stones    Per New Patient Packet   History of neuropathy    Per New Patient Packet   Hypertension    Per New Patient Packet   Insomnia    Kidney stone    Lumbar radicular pain    Per New Patient Packet   Lyme disease    Per New Patient Packet   Myocardial infarction [facility])    Overactive bladder    PONV (postoperative nausea and vomiting)    Skin cancer, basal cell    Sleep trouble    Per New Patient Packet   Spinal cord stimulator status    01/08/21 - not currently using.   Squamous cell skin cancer    Substance abuse (HCC)     Current Outpatient Medications  Medication Sig Dispense Refill   amitriptyline (ELAVIL) 10 MG tablet Take 3 tablets (30 mg total) by mouth at bedtime. 270 tablet 1   butalbital-acetaminophen-caffeine (FIORICET) 50-325-40 MG tablet Take by mouth.     chlorthalidone (HYGROTON) 25 MG tablet Take 1 tablet (25 mg total) by mouth daily. 90 tablet 0   Cholecalciferol 125 MCG (5000 UT) TABS Take by mouth.     cholestyramine (QUESTRAN) 4 g packet Take 1 packet (4 g total) by mouth 3 (three) times daily. 270 packet 3   Cyanocobalamin (VITAMIN B-12) 5000 MCG LOZG Take 1 lozenge by mouth daily.      diazepam (VALIUM) 5 MG tablet Take 5 mg PO in am and 10 mg PO QHS 90 tablet 0   dicyclomine (BENTYL) 20 MG tablet Take 20 mg by mouth 4 (four) times daily.     Digestive Aids Mixture (DIGESTION GB PO) Take 1  capsule by mouth daily.  (Patient not taking: Reported on 04/03/2021)     escitalopram (LEXAPRO) 20 MG tablet Take 1 tablet (20 mg total) by mouth daily. 90 tablet 1   ezetimibe (ZETIA) 10 MG tablet Take 1 tablet (10 mg total) by mouth daily. 90 tablet 1   GLUTATHIONE PO Take 1 tablet by mouth daily.     HYDROcodone-acetaminophen (NORCO) 10-325 MG tablet Take 1-2 tablets by mouth 2 (two) times daily as needed for severe pain. 60 tablet 0   [START ON 05/21/2021] HYDROcodone-acetaminophen (NORCO) 10-325 MG tablet Take 1-2 tablets by mouth 2 (two) times daily as needed for severe pain. 60 tablet 0   Loperamide HCl (IMODIUM PO) Take by mouth as needed.      Melatonin 10 MG TABS Take 10 mg by mouth at bedtime.      NONFORMULARY OR COMPOUNDED ITEM Bi-Mix Injection  Papaverine HCI/Phentolamine Mesylate '150mg'$ /'55mg'$ /Vial Inject 7m prior to intercourse   For intracavernosal use only 5 each 11   ondansetron (ZOFRAN) 4 MG tablet Take 4 mg by mouth as needed  for nausea or vomiting.      OVER THE COUNTER MEDICATION G I Detox  - 2x/week Gastromend HP Daily Glutaloemine Daily Lithium Oronate Daily GABA Liposomal Daily Fungus Eliminator     Potassium 99 MG TABS Take by mouth 3 (three) times a week. K-Zyme     Probiotic Product (PROBIOTIC DAILY PO) Take by mouth.     propranolol (INDERAL) 40 MG tablet Take 1 tablet (40 mg total) by mouth 2 (two) times daily. 180 tablet 1   RABEprazole (ACIPHEX) 20 MG tablet Take 1 tablet (20 mg total) by mouth 2 (two) times daily. 180 tablet 1   Rhodiola rosea (RHODIOLA PO) Take by mouth.     sucralfate (CARAFATE) 1 g tablet Take 1 g by mouth 4 (four) times daily as needed.      testosterone cypionate (DEPOTESTOSTERONE CYPIONATE) 200 MG/ML injection Inject 60 mg into the muscle every 14 (fourteen) days.     Theanine 50 MG TBDP Take by mouth at bedtime.     tizanidine (ZANAFLEX) 2 MG capsule Take 1 capsule (2 mg total) by mouth every 12 (twelve) hours as needed. 180 capsule  0   valACYclovir (VALTREX) 1000 MG tablet TAKE 2 TABLETS BY MOUTH AND REPEAT IN 12 HOURS FOR COLD SORE 4 tablet 1   Vibegron (GEMTESA) 75 MG TABS Take 75 mg by mouth daily. 90 tablet 3   No current facility-administered medications for this visit.    Allergies  Allergen Reactions   Ace Inhibitors     Other reaction(s): Cough   Fluoxetine Anxiety    "made me fall asleep" per pt "bad headaches and "makes  Me  Crazy" historical allergy noted in [employer] "made me fall asleep" per pt "bad headaches and "makes  Me  Crazy" Per New Patient Packet.     Metoclopramide     Other reaction(s): Other (See Comments), Other (See Comments), Unknown Tardive Dyskinesia  historical allergy noted in [employer] Tardive Dyskinesia  Per New Patient Packet.   Nalbuphine     Used Post Back surgery- Anesthesiologist Error. Patient had Narcotic Withdraw. Per New Patient Packet.    Other     Other reaction(s): Other (See Comments) Altered mental status in combo with narcotics at previous hospitalization - Full Withdrawal Symptoms Other reaction(s): Rash   Amoxicillin-Pot Clavulanate Nausea Only    Per New Patient Packet.   Doxazosin Rash    Other reaction(s): Other - See Comments, Rash UNKNOWN REACTION UNKNOWN REACTION    Duloxetine Nausea Only    Per New Patient Packet.    Penicillins Nausea Only    Per New Patient Packet.   Tamsulosin Itching and Anxiety    Restless, Flushing, Heavy Chest, Itching, Hyperactive mood and Anxiety. Unable to handle side effects. Per New Patient Packet.     Trazodone And Nefazodone Itching, Anxiety and Rash    Headache. "INCREASED MY ANXIETY AND HEARTRATE" Flushing, tachycardia "INCREASED MY ANXIETY AND HEARTRATE" Per New Patient Packet.    Amlodipine     Shaking, unsure of reaction. Per New Patient Packet.     Cinoxacin     GI Intolerance, and Dizziness. Per New Patient Packet.    Ciprofloxacin     Other reaction(s): Unknown   Nebivolol     Other  reaction(s): Unknown   Olanzapine     Headache and unable to sleep for 3 nights. Per New Patient Packet.    Olmesartan     Other reaction(s): Unknown   Pregabalin     Confusion,  Lack of concentration, dizziness, and likely drowsiness. Per New Patient Packet.     Prostaglandins     Other reaction(s): Other (See Comments) Intolerance   Thyroid Hormones     Other reaction(s): Other (See Comments) Thyroid (Nature Thyroid) contraindicated with some of your other medications.   Zolpidem     Nightmares, Ineffective after 2 days. Per New Patient Packet.    Duloxetine Hcl     Other reaction(s): Rash   Fluoxetine Hcl     Other reaction(s): Rash   Phenytoin Anxiety    Hyperactivity, and Ineffective. Per New Patient Packet.     Family History  Problem Relation Age of Onset   Heart disease Father    Heart failure Father    Hypertension Father    Stroke Father    Stroke Mother    Dementia Mother    Kidney Stones Daughter    Anxiety disorder Daughter    OCD Daughter     Social History   Socioeconomic History   Marital status: Married    Spouse name: Not on file   Number of children: Not on file   Years of education: Not on file   Highest education level: Not on file  Occupational History   Not on file  Tobacco Use   Smoking status: Never   Smokeless tobacco: Never  Vaping Use   Vaping Use: Never used  Substance and Sexual Activity   Alcohol use: Yes    Comment: 1 Drink a Month, socially   Drug use: Not Currently   Sexual activity: Yes    Birth control/protection: None  Other Topics Concern   Not on file  Social History Narrative   Tobacco use, amount per day now: None   Past tobacco use, amount per day: None   How many years did you use tobacco: 0   Alcohol use (drinks per week): 0-1 Month   Diet:   Do you drink/eat things with caffeine: Occasionally ( Hot Chocolate and maybe 1/4 of 16oz Pepsi 2-3 times a week.   Marital status: Married                                   What year were you married? 1970   Do you live in a house, apartment, assisted living, condo, trailer, etc.? House   Is it one or more stories? One   How many persons live in your home? 2   Do you have pets in your home?( please list)  No   Highest Level of education completed: Masters   Current or past profession: Intensive Transport planner for [employer]   Do you exercise? Yes                                    Type and how often? [place], Recumbent Bike, 3 times a week. Try to get a 1.2 mile walk at least 3 times a week or more.     Do you have a living will? Yes   Do you have a DNR form?  No                                 If not, do you want to discuss one?   Do you have signed POA/HPOA  forms? Yes                       If so, please bring to you appointment    Do you have any difficulty bathing or dressing yourself? No    Do you have difficulty preparing food or eating? No   Do you have difficulty managing your medications? No   Do you have any difficulty managing your finances? No   Do you have any difficulty affording your medications? No         Social Determinants of Health   Financial Resource Strain: Not on file  Food Insecurity: Not on file  Transportation Needs: Not on file  Physical Activity: Not on file  Stress: Not on file  Social Connections: Not on file  Intimate Partner Violence: Not on file    Hospitiliaztions: None  Health Maintenance:    Flu: 06/2018  Tetanus: 07/2020  Pneumovax: 02/2017  Prevnar: 12/2013  Zostavax: 09/2015  Shingrix: 03/2017, 06/2018  COVID: Moderna x3  PSA:  03/2021  Colon Screening: 09/2020  Eye Doctor: annually  Dental Exam: biannually   Providers:   PCP: Webb Silversmith, NP  Urologist: Dr. Diamantina Providence  Ophthalmologist: Dr. Lenis Dickinson  Pain management: Dr. Holley Raring  Gastroenterologist: Dr. Vicente Males  Dermatology: Dr. Dallie Piles   I have personally reviewed and have noted:  1. The patient's medical and social  history 2. Their use of alcohol, tobacco or illicit drugs 3. Their current medications and supplements 4. The patient's functional ability including ADL's, fall risks, home safety risks and hearing or visual impairment. 5. Diet and physical activities 6. Evidence for depression or mood disorder  Subjective:   Review of Systems:   Constitutional: Patient reports intermittent headaches.  Denies fever, malaise, fatigue, or abrupt weight changes.  HEENT: Denies eye pain, eye redness, ear pain, ringing in the ears, wax buildup, runny nose, nasal congestion, bloody nose, or sore throat. Respiratory: Denies difficulty breathing, shortness of breath, cough or sputum production.   Cardiovascular: Denies chest pain, chest tightness, palpitations or swelling in the hands or feet.  Gastrointestinal: Patient reports intermittent diarrhea.  Denies abdominal pain, bloating, constipation, or blood in the stool.  GU: Pt reports erectile dysfunction. Denies urgency, frequency, pain with urination, burning sensation, blood in urine, odor or discharge. Musculoskeletal: Patient reports chronic joint pain.  Denies decrease in range of motion, difficulty with gait, muscle pain or joint swelling.  Skin: Denies redness, rashes, lesions or ulcercations.  Neurological: Patient reports tremor.  Denies dizziness, difficulty with memory, difficulty with speech or problems with balance and coordination.  Psych: Patient has a history of anxiety and depression.  Denies SI/HI.  No other specific complaints in a complete review of systems (except as listed in HPI above).  Objective:  PE:  BP (!) 151/77 (BP Location: Right Arm, Patient Position: Sitting, Cuff Size: Normal)   Pulse 60   Temp (!) 97.5 F (36.4 C) (Temporal)   Resp 17   Ht '5\' 9"'$  (1.753 m)   Wt 148 lb 9.6 oz (67.4 kg)   SpO2 100%   BMI 21.94 kg/m   Wt Readings from Last 3 Encounters:  02/06/21 146 lb (66.2 kg)  02/05/21 146 lb 9.6 oz (66.5 kg)   01/28/21 146 lb (66.2 kg)    General: Appears his stated age, chronically ill-appearing in NAD. Skin: Warm, dry and intact. HEENT: Head: normal shape and size; Eyes: sclera white and EOMs intact;  Neck: Neck supple, trachea midline. No  masses, lumps or thyromegaly present.  Cardiovascular: Normal rate and rhythm. S1,S2 noted.  No murmur, rubs or gallops noted. No JVD or BLE edema. No carotid bruits noted. Pulmonary/Chest: Normal effort and positive vesicular breath sounds. No respiratory distress. No wheezes, rales or ronchi noted.  Abdomen: Soft and nontender. Normal bowel sounds. No distention or masses noted. Liver, spleen and kidneys non palpable. Musculoskeletal: Strength 5/5 BUE/BLE. No difficulty with gait. Neurological: Alert and oriented. Cranial nerves II-XII grossly intact. Coordination normal.  Psychiatric: Mood and affect normal. Behavior is normal. Judgment and thought content normal.    BMET    Component Value Date/Time   NA 145 05/20/2020 0000   K 3.2 (A) 05/20/2020 0000   CL 102 05/20/2020 0000   CO2 34 (A) 05/20/2020 0000   BUN 1 (A) 05/20/2020 0000   CREATININE 0.90 03/06/2020 1134   CALCIUM 9.8 05/20/2020 0000   GFRNONAA 87 05/20/2020 0000   GFRAA 101 05/20/2020 0000    Lipid Panel     Component Value Date/Time   CHOL 170 05/16/2020 0000   TRIG 59 05/16/2020 0000   HDL 56 05/16/2020 0000   LDLCALC 100 05/16/2020 0000    CBC    Component Value Date/Time   WBC 6.2 05/20/2020 0000   RBC 5.21 (A) 05/20/2020 0000   HGB 15.6 05/20/2020 0000   HCT 47 05/20/2020 0000   PLT 199 05/20/2020 0000    Hgb A1C Lab Results  Component Value Date   HGBA1C 5.1 10/17/2019      Assessment and Plan:   Medicare Annual Wellness Visit:  Diet: He does eat meat. He consumes fruits and veggies. He tries to avoid fried foods. He drinks mostly water, iced nonsweet tea. Physical activity: Aquatic fitness Depression/mood screen: Chronic, PHQ 9 score of 5 Hearing:  Intact to whispered voice Visual acuity: Grossly normal, performs annual eye exam  ADLs: Capable Fall risk: None Home safety: Good Cognitive evaluation: Intact to orientation, naming, recall and repetition EOL planning: Adv directives, full code/ I agree  Preventative Medicine: Encouraged him to get a flu shot in the fall.  Tetanus, Pneumovax, Prevnar, Zostavax and Shingrix UTD.  Encouraged him to get his COVID booster.  Colon screening UTD.  Encouraged him to consume about statin exercise regimen.  Advised him to see an eye doctor and dentist annually. Labs reviewed and scanned into the chart.  Due dates for screening exam given to patient as part of his AVS.   Next appointment: 6 months follow-up chronic conditions   Webb Silversmith, NP This visit occurred during the SARS-CoV-2 public health emergency.  Safety protocols were in place, including screening questions prior to the visit, additional usage of staff PPE, and extensive cleaning of exam room while observing appropriate contact time as indicated for disinfecting solutions.

## 2021-05-08 NOTE — Telephone Encounter (Signed)
error 

## 2021-05-15 ENCOUNTER — Other Ambulatory Visit: Payer: Self-pay

## 2021-05-15 ENCOUNTER — Ambulatory Visit (INDEPENDENT_AMBULATORY_CARE_PROVIDER_SITE_OTHER): Payer: Medicare Other | Admitting: Urology

## 2021-05-15 ENCOUNTER — Encounter: Payer: Self-pay | Admitting: Urology

## 2021-05-15 VITALS — BP 144/87 | HR 63 | Ht 69.0 in | Wt 147.7 lb

## 2021-05-15 DIAGNOSIS — N529 Male erectile dysfunction, unspecified: Secondary | ICD-10-CM | POA: Diagnosis not present

## 2021-05-15 MED ORDER — NONFORMULARY OR COMPOUNDED ITEM: 6 refills | Status: DC

## 2021-05-15 NOTE — Progress Notes (Signed)
   05/15/2021 2:37 PM   Grant Young Sep 12, 1946 [id number]  Reason for visit: Follow up ED  HPI: I saw Grant Young back for discussion of the ED treatment.  Briefly, 75 year old very comorbid male with history of greenlight PVP in the distant past with good urinary stream moving forward, ED on bimix via [place], and history of nephrolithiasis.  He has been having problems with his bimix not being as successful as previously for his erections.  He gets this medication through a pharmacy in [place].  He has an allergy to alprostadil, so he cannot use Trimix.  He currently is using the Bimix in addition to a penile ring to achieve a sufficient erection.  He is on a number of medications that negatively impact erections including propanolol, Norco, and Ativan.  I recommended increasing the strength of Bimix and titrating up slowly.  We discussed the risk of priapism at length.   Billey Co, [facility] [address], [place], [place] [address] [phone]

## 2021-05-26 ENCOUNTER — Encounter: Payer: Self-pay | Admitting: Internal Medicine

## 2021-05-28 MED ORDER — LORAZEPAM 1 MG PO TABS: 1.0000 mg | ORAL_TABLET | Freq: Two times a day (BID) | ORAL | 0 refills | Status: DC

## 2021-05-29 ENCOUNTER — Telehealth: Payer: Self-pay | Admitting: Internal Medicine

## 2021-05-29 NOTE — Telephone Encounter (Signed)
Pt called stating that the pharmacy is not allowing him to pick up his Ativan prescription early. He states that they do not have a message stating it can be refilled early. Reached out to pharmacy due to seeing notes from PCP on prescription and they state that they did not receive a note and they are not able to refill without one. They state that they are needing to have a note that this can be refilled early and for what reason. Please advise.      [id number]  [id number] Phone: [phone]: [phone] Hours: Not open 24 hours

## 2021-05-29 NOTE — Telephone Encounter (Signed)
I did put a note on the RX that it can be filled early due to him going out of town. Can you please call and give pharmacy this info. Thx!

## 2021-06-05 ENCOUNTER — Ambulatory Visit: Payer: Medicare Other | Admitting: Gastroenterology

## 2021-06-17 ENCOUNTER — Ambulatory Visit
Payer: Worker's Compensation | Attending: Student in an Organized Health Care Education/Training Program | Admitting: Student in an Organized Health Care Education/Training Program

## 2021-06-17 ENCOUNTER — Encounter: Payer: Self-pay | Admitting: Student in an Organized Health Care Education/Training Program

## 2021-06-17 ENCOUNTER — Other Ambulatory Visit: Payer: Self-pay

## 2021-06-17 VITALS — BP 149/82 | HR 73 | Temp 97.2°F | Resp 16 | Ht 69.0 in | Wt 147.0 lb

## 2021-06-17 DIAGNOSIS — G43701 Chronic migraine without aura, not intractable, with status migrainosus: Secondary | ICD-10-CM | POA: Diagnosis not present

## 2021-06-17 DIAGNOSIS — M5481 Occipital neuralgia: Secondary | ICD-10-CM | POA: Insufficient documentation

## 2021-06-17 DIAGNOSIS — G894 Chronic pain syndrome: Secondary | ICD-10-CM | POA: Diagnosis not present

## 2021-06-17 DIAGNOSIS — M47816 Spondylosis without myelopathy or radiculopathy, lumbar region: Secondary | ICD-10-CM | POA: Diagnosis not present

## 2021-06-17 DIAGNOSIS — Z9689 Presence of other specified functional implants: Secondary | ICD-10-CM | POA: Diagnosis present

## 2021-06-17 DIAGNOSIS — M792 Neuralgia and neuritis, unspecified: Secondary | ICD-10-CM | POA: Diagnosis present

## 2021-06-17 DIAGNOSIS — M7918 Myalgia, other site: Secondary | ICD-10-CM | POA: Diagnosis not present

## 2021-06-17 MED ORDER — TAPENTADOL HCL 50 MG PO TABS: 50.0000 mg | ORAL_TABLET | Freq: Three times a day (TID) | ORAL | 0 refills | Status: DC | PRN

## 2021-06-17 NOTE — Progress Notes (Signed)
Nursing Pain Medication Assessment:  Safety precautions to be maintained throughout the outpatient stay will include: orient to surroundings, keep bed in low position, maintain call bell within reach at all times, provide assistance with transfer out of bed and ambulation.  Medication Inspection Compliance: Pill count conducted under aseptic conditions, in front of the patient. Neither the pills nor the bottle was removed from the patient's sight at any time. Once count was completed pills were immediately returned to the patient in their original bottle.  Medication: Hydrocodone/APAP Pill/Patch Count:  23 of 60 pills remain Pill/Patch Appearance: Markings consistent with prescribed medication Bottle Appearance: Standard pharmacy container. Clearly labeled. Filled Date: 09 / 07 / 2022 Last Medication intake:  Today

## 2021-06-17 NOTE — Progress Notes (Signed)
PROVIDER NOTE: Information contained herein reflects review and annotations entered in association with encounter. Interpretation of such information and data should be left to medically-trained personnel. Information provided to patient can be located elsewhere in the medical record under "Patient Instructions". Document created using STT-dictation technology, any transcriptional errors that may result from process are unintentional.    Patient: Grant Young  Service Category: E/M  Provider: [name], MD  DOB: 1946-07-23  DOS: 06/17/2021  Specialty: Interventional Pain Management  MRN: [id number]  Setting: Ambulatory outpatient  PCP: [name], NP  Type: Established Patient    Referring Provider: [name], NP  Location: Office  Delivery: Face-to-face     HPI  [name], a 75 y.o. year old male, is here today because of his Chronic pain syndrome [G89.4]. Mr. Vieth primary complain today is Back Pain (lower), Foot Pain (bilat), and Ankle Pain (bilat) Last encounter: My last encounter with him was on 04/10/21 Pertinent problems: Mr. 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 Pain Assessment: Severity of Chronic pain is reported as a 5  (back; 8 legs)/10. Location: Back Lower/denies. Onset: More than a month ago. Quality: Aching. Timing: Constant. Modifying factor(s): meds. Vitals:  height is '5\' 9"'  (1.753 m) and weight is 147 lb (66.7 kg). His temporal temperature is 97.2 F (36.2 C) (abnormal). His blood pressure is 149/82 (abnormal) and his pulse is 73. His respiration is 16 and oxygen saturation is 100%.   Reason for encounter: medication management.     Patient presents today complaining of increased lumbar spine pain related to lumbar facet arthropathy.  He is status post bilateral L3, L4, L5 lumbar facet  medial branch nerve block on 11/13/2020 provided him with 80 to 90% pain relief in regards to his low back pain for approximately 6 months.  He would like to repeat these.  Is also endorsing increased neuropathic pain of bilateral feet described as burning, tingling, irritating sensation.  We discussed Qutenza treatment.  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  We discussed transition to [place] for chronic pain management as it has SNRI potential which could be helpful for neuropathic pain.  If this is not effective, can transition back to hydrocodone.   Pharmacotherapy Assessment  Analgesic:Nucynta 50 mg every 8 hours as needed  Monitoring: [place] PMP: PDMP reviewed during this encounter.       Pharmacotherapy: No side-effects or adverse reactions reported. Compliance: No problems identified. Effectiveness: Clinically acceptable.  UDS:  Summary  Date Value Ref Range Status  09/17/2020 Note  Final    Comment:    ==================================================================== Compliance Drug Analysis, Ur ==================================================================== Specimen Alert Note: Urinary creatinine is low; ability to detect some drugs may be compromised. Interpret results with caution. (Creatinine) ==================================================================== Test                             Result       Flag       Units  Drug Present and Declared for Prescription Verification   Lorazepam                      1107         EXPECTED  ng/mg creat    Source of lorazepam is a scheduled prescription medication.    Butalbital                     PRESENT      EXPECTED   Citalopram                     PRESENT      EXPECTED   Desmethylcitalopram            PRESENT      EXPECTED    Desmethylcitalopram is an expected metabolite of citalopram or  the    enantiomeric form, escitalopram.    Acetaminophen                  PRESENT      EXPECTED   Propranolol                    PRESENT      EXPECTED  Drug Present not Declared for Prescription Verification   Oxazepam                       300          UNEXPECTED ng/mg creat   Temazepam                      357          UNEXPECTED ng/mg creat    Oxazepam and temazepam are expected metabolites of diazepam.    Oxazepam is also an expected metabolite of other benzodiazepine    drugs, including chlordiazepoxide, prazepam, clorazepate, halazepam,    and temazepam.  Oxazepam and temazepam are available as scheduled    prescription medications.  Drug Absent but Declared for Prescription Verification   Hydrocodone                    Not Detected UNEXPECTED ng/mg creat   Tizanidine                     Not Detected UNEXPECTED    Tizanidine, as indicated in the declared medication list, is not    always detected even when used as directed.    Amitriptyline                  Not Detected UNEXPECTED   Prochlorperazine               Not Detected UNEXPECTED   Salicylate                     Not Detected UNEXPECTED    Aspirin, as indicated in the declared medication list, is not always    detected even when used as directed.  ==================================================================== Test                      Result    Flag   Units      Ref Range   Creatinine              14        LL     mg/dL      >=20 ==================================================================== Declared Medications:  The flagging and interpretation on this report are based on the  following declared medications.  Unexpected results may arise from  inaccuracies in the declared medications.   **Note: The testing scope of this panel includes these medications:  Amitriptyline (Elavil)  Butalbital (Fioricet)  Butalbital (Fiorinal)  Escitalopram (Lexapro)  Hydrocodone (Norco)  Lorazepam (Ativan)   Prochlorperazine (Compazine)  Propranolol (Inderal)   **Note: The testing scope of this panel does not include small to  moderate amounts of these reported medications:   Acetaminophen (Fioricet)  Acetaminophen (Norco)  Aspirin (Fiorinal)  Tizanidine (Zanaflex)   **Note: The testing scope of this panel does not include the  following reported medications:   Anastrozole (Arimidex)  Caffeine (Fioricet)  Caffeine (Fiorinal)  Chlorthalidone (Hygroton)  Cholecalciferol  Cholestyramine (Questran)  Dicyclomine (Bentyl)  Ezetimibe (Zetia)  Hydrocortisone  Melatonin  Ondansetron (Zofran)  Potassium (Klor-Con)  Rabeprazole (Aciphex)  Sucralfate (Carafate)  Valacyclovir (Valtrex)  Vitamin B12 ==================================================================== For clinical consultation, please call [phone]. ====================================================================       ROS  Constitutional: Denies any fever or chills Gastrointestinal: No reported hemesis, hematochezia, vomiting, or acute GI distress Musculoskeletal:  mid, low  back pain Neurological: No reported episodes of acute onset apraxia, aphasia, dysarthria, agnosia, amnesia, paralysis, loss of coordination, or loss of consciousness  Medication Review  Cholecalciferol, Digestive Aids Mixture, Glutathione, LORazepam, Loperamide HCl, NONFORMULARY OR COMPOUNDED ITEM, OVER THE COUNTER MEDICATION, Potassium, Probiotic Product, RABEprazole, Rhodiola rosea, Theanine, Vibegron, Vitamin B-12, amitriptyline, anastrozole, butalbital-acetaminophen-caffeine, chlorthalidone, cholestyramine, dicyclomine, escitalopram, ezetimibe, melatonin, ondansetron, prochlorperazine, propranolol, sucralfate, tapentadol, testosterone cypionate, tizanidine, and valACYclovir  History Review  Allergy: Mr. Dunne is allergic to ace inhibitors, fluoxetine, metoclopramide, nalbuphine, other, amoxicillin-pot clavulanate, doxazosin, duloxetine,  penicillins, tamsulosin, trazodone and nefazodone, amlodipine, cinoxacin, ciprofloxacin, nebivolol, olanzapine, olmesartan, pregabalin, prostaglandins, thyroid hormones, zolpidem, duloxetine hcl, fluoxetine hcl, and phenytoin. Drug: Mr. Frix  reports that he does not currently use drugs. Alcohol:  reports current alcohol use. Tobacco:  reports that he has never smoked. He has never used smokeless tobacco. Social: Mr. Comunale  reports that he has never smoked. He has never used smokeless tobacco. He reports current alcohol use. He reports that he does not currently use drugs. Medical:  has a past medical history of Anxiety, Chronic back pain, Chronic heart disease, Depression, GERD (gastroesophageal reflux disease), Headache, History of CT scan of brain (09/14/2018), History of depression, History of gastritis, History of headache, History of kidney stones, History of neuropathy, Hypertension, Insomnia, Kidney stone, Lumbar radicular pain, Lyme disease, Myocardial infarction ([place]), Overactive bladder, PONV (postoperative nausea and vomiting), Skin cancer, basal cell, Sleep trouble, Spinal cord stimulator status, Squamous cell skin cancer, and Substance abuse ([place]). Surgical: Mr. Hickle  has a past surgical history that includes Kidney stone surgery (09/14/1980); Kidney stone surgery (09/15/1995); Lithotripsy (09/14/2014); Lithotripsy (09/14/1996); Lithotripsy (09/14/1997); Gallbladder surgery (09/15/2015); Sigmoidoscopy (09/14/2017); Colonoscopy (09/15/2015); Cholecystectomy (2017); Shoulder surgery (Right, 1984); Back surgery; Spinal cord stimulator implant; Pain pump implantation; Pain pump removal; Tonsillectomy; Foot surgery (Bilateral, 03/18/2020); Foot surgery (08/032021); Upper gi endoscopy; Cardiac catheterization; Colonoscopy with propofol (N/A, 10/03/2020); Esophagogastroduodenoscopy (egd) with propofol (N/A, 10/03/2020); Cataract extraction w/PHACO (Left, 01/14/2021); and Cataract extraction w/PHACO (Right,  01/28/2021). Family: family history includes Anxiety disorder in his daughter; Dementia in his mother; Heart disease in his father; Heart failure in his father; Hypertension in his father; Kidney Stones in his daughter; OCD in his daughter; Stroke in his father and mother.  Laboratory Chemistry Profile   Renal Lab Results  Component Value Date   BUN 1 (A) 05/20/2020   CREATININE 0.90 03/06/2020   GFRAA 101 05/20/2020   GFRNONAA 87 05/20/2020     Hepatic Lab Results  Component Value Date   AST 18 05/20/2020   ALT 24 05/20/2020  ALBUMIN 4.2 05/20/2020   ALKPHOS 149 (H) 09/16/2020     Electrolytes Lab Results  Component Value Date   NA 145 05/20/2020   K 3.2 (A) 05/20/2020   CL 102 05/20/2020   CALCIUM 9.8 05/20/2020     Bone Lab Results  Component Value Date   TESTOSTERONE 33.2 05/20/2020     Inflammation (CRP: Acute Phase) (ESR: Chronic Phase) No results found for: CRP, ESRSEDRATE, LATICACIDVEN     Note: Above Lab results reviewed.  Physical Exam  General appearance: Well nourished, well developed, and well hydrated. In no apparent acute distress Mental status: Alert, oriented x 3 (person, place, & time)       Respiratory: No evidence of acute respiratory distress Eyes: PERLA Vitals: BP (!) 149/82   Pulse 73   Temp (!) 97.2 F (36.2 C) (Temporal)   Resp 16   Ht '5\' 9"'  (1.753 m)   Wt 147 lb (66.7 kg)   SpO2 100%   BMI 21.71 kg/m  BMI: Estimated body mass index is 21.71 kg/m as calculated from the following:   Height as of this encounter: '5\' 9"'  (1.753 m).   Weight as of this encounter: 147 lb (66.7 kg). Ideal: Ideal body weight: 70.7 kg (155 lb 13.8 oz)  Lumbar Spine Area Exam  Skin & Axial Inspection: Well healed scar from previous spine surgery detected IPG present from SCS Alignment: Scoliosis detected Functional ROM: Pain restricted ROM       Stability: No instability detected Muscle Tone/Strength: Functionally intact. No obvious neuro-muscular  anomalies detected. Sensory (Neurological): Facet agenic pain pattern, pain with facet loading Gait & Posture Assessment  Ambulation: Unassisted Gait: Relatively normal for age and body habitus Posture: WNL   Lower Extremity Exam    Side: Right lower extremity  Side: Left lower extremity  Stability: No instability observed          Stability: No instability observed          Skin & Extremity Inspection: Skin color, temperature, and hair growth are WNL. No peripheral edema or cyanosis. No masses, redness, swelling, asymmetry, or associated skin lesions. No contractures.  Skin & Extremity Inspection: Skin color, temperature, and hair growth are WNL. No peripheral edema or cyanosis. No masses, redness, swelling, asymmetry, or associated skin lesions. No contractures.  Functional ROM: Unrestricted ROM                  Functional ROM: Unrestricted ROM                  Muscle Tone/Strength: Functionally intact. No obvious neuro-muscular anomalies detected.  Muscle Tone/Strength: Functionally intact. No obvious neuro-muscular anomalies detected.  Sensory (Neurological): Unimpaired        Sensory (Neurological): Unimpaired        DTR: Patellar: deferred today Achilles: deferred today Plantar: deferred today  DTR: Patellar: deferred today Achilles: deferred today Plantar: deferred today  Palpation: No palpable anomalies  Palpation: No palpable anomalies    Assessment   Status Diagnosis  Controlled Having a Flare-up Having a Flare-up 1. Chronic pain syndrome   2. Lumbar facet arthropathy   3. Lumbar spondylosis   4. 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 Muscle pain, myofascial   6. Bilateral occipital neuralgia   7. Neuropathic pain   8. 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 DANYL DEEMS has a current medication list which includes the following  long-term medication(s): amitriptyline, chlorthalidone,  cholestyramine, dicyclomine, escitalopram, ezetimibe, potassium, propranolol, rabeprazole, sucralfate, and testosterone cypionate.  Pharmacotherapy (Medications Ordered): Meds ordered this encounter  Medications   tapentadol (NUCYNTA) 50 MG tablet    Sig: Take 1 tablet (50 mg total) by mouth every 8 (eight) hours as needed for moderate pain or severe pain. Do not take if you have epilepsy or a history of seizures. Swallow tablets whole. Do not chew, crush or dissolve.    Dispense:  90 tablet    Refill:  0    Chronic Pain: STOP Act (Not applicable) Fill 1 day early if closed on refill date. Avoid benzodiazepines within 8 hours of opioids   Orders:  Orders Placed This Encounter  Procedures   LUMBAR FACET(MEDIAL BRANCH NERVE BLOCK) MBNB    Standing Status:   Future    Standing Expiration Date:   07/18/2021    Scheduling Instructions:     Procedure: Lumbar facet block (AKA.: Lumbosacral medial branch nerve block)     Side: Bilateral     Level: L3-4, L4-5,Facets (L3, L4, L5, Medial Branch Nerves)    Order Specific Question:   Where will this procedure be performed?    Answer:   [facility]   Follow-up plan:   Return in about 2 weeks (around 07/01/2021) for B/L L3, 4, 5 Fcts , without sedation.     Status post Botox No. 1 on 01/01/2020 for migraine, lumbar TPI as well. SPG block 03/13/20. Bilateral GONB 04/01/20. 11/13/20 B/L L3,4,5 Facet blocks helpful repeat prn          Recent Visits Date Type Provider Dept  04/10/21 Telemedicine [name], MD [facility]  Showing recent visits within past 90 days and meeting all other requirements Today's Visits Date Type Provider Dept  06/17/21 Office Visit [name], MD [facility]  Showing today's visits and meeting all other requirements Future Appointments Date Type Provider Dept  07/15/21 Appointment [name], MD [facility]  Showing future appointments within next 90 days and meeting all other  requirements I discussed the assessment and treatment plan with the patient. The patient was provided an opportunity to ask questions and all were answered. The patient agreed with the plan and demonstrated an understanding of the instructions.  Patient advised to call back or seek an in-person evaluation if the symptoms or condition worsens.  Duration of encounter: 78mnutes.  Note by: BGillis Santa MD Date: 06/17/2021; Time: 9:49 AM

## 2021-06-17 NOTE — Patient Instructions (Signed)
GENERAL RISKS AND COMPLICATIONS ° °What are the risk, side effects and possible complications? °Generally speaking, most procedures are safe.  However, with any procedure there are risks, side effects, and the possibility of complications.  The risks and complications are dependent upon the sites that are lesioned, or the type of nerve block to be performed.  The closer the procedure is to the spine, the more serious the risks are.  Great care is taken when placing the radio frequency needles, block needles or lesioning probes, but sometimes complications can occur. °Infection: Any time there is an injection through the skin, there is a risk of infection.  This is why sterile conditions are used for these blocks.  There are four possible types of infection. °Localized skin infection. °Central Nervous System Infection-This can be in the form of Meningitis, which can be deadly. °Epidural Infections-This can be in the form of an epidural abscess, which can cause pressure inside of the spine, causing compression of the spinal cord with subsequent paralysis. This would require an emergency surgery to decompress, and there are no guarantees that the patient would recover from the paralysis. °Discitis-This is an infection of the intervertebral discs.  It occurs in about 1% of discography procedures.  It is difficult to treat and it may lead to surgery. ° °      2. Pain: the needles have to go through skin and soft tissues, will cause soreness. °      3. Damage to internal structures:  The nerves to be lesioned may be near blood vessels or   ° other nerves which can be potentially damaged. °      4. Bleeding: Bleeding is more common if the patient is taking blood thinners such as  aspirin, Coumadin, Ticiid, Plavix, etc., or if he/she have some genetic predisposition  such as hemophilia. Bleeding into the spinal canal can cause compression of the spinal  cord with subsequent paralysis.  This would require an emergency  surgery to  decompress and there are no guarantees that the patient would recover from the  paralysis. °      5. Pneumothorax:  Puncturing of a lung is a possibility, every time a needle is introduced in  the area of the chest or upper back.  Pneumothorax refers to free air around the  collapsed lung(s), inside of the thoracic cavity (chest cavity).  Another two possible  complications related to a similar event would include: Hemothorax and Chylothorax.   These are variations of the Pneumothorax, where instead of air around the collapsed  lung(s), you may have blood or chyle, respectively. °      6. Spinal headaches: They may occur with any procedures in the area of the spine. °      7. Persistent CSF (Cerebro-Spinal Fluid) leakage: This is a rare problem, but may occur  with prolonged intrathecal or epidural catheters either due to the formation of a fistulous  track or a dural tear. °      8. Nerve damage: By working so close to the spinal cord, there is always a possibility of  nerve damage, which could be as serious as a permanent spinal cord injury with  paralysis. °      9. Death:  Although rare, severe deadly allergic reactions known as "Anaphylactic  reaction" can occur to any of the medications used. °     10. Worsening of the symptoms:  We can always make thing worse. ° °What are the chances   of something like this happening? °Chances of any of this occuring are extremely low.  By statistics, you have more of a chance of getting killed in a motor vehicle accident: while driving to the hospital than any of the above occurring .  Nevertheless, you should be aware that they are possibilities.  In general, it is similar to taking a shower.  Everybody knows that you can slip, hit your head and get killed.  Does that mean that you should not shower again?  Nevertheless always keep in mind that statistics do not mean anything if you happen to be on the wrong side of them.  Even if a procedure has a 1 (one) in a  1,000,000 (million) chance of going wrong, it you happen to be that one..Also, keep in mind that by statistics, you have more of a chance of having something go wrong when taking medications. ° °Who should not have this procedure? °If you are on a blood thinning medication (e.g. Coumadin, Plavix, see list of "Blood Thinners"), or if you have an active infection going on, you should not have the procedure.  If you are taking any blood thinners, please inform your physician. ° °How should I prepare for this procedure? °Do not eat or drink anything at least six hours prior to the procedure. °Bring a driver with you .  It cannot be a taxi. °Come accompanied by an adult that can drive you back, and that is strong enough to help you if your legs get weak or numb from the local anesthetic. °Take all of your medicines the morning of the procedure with just enough water to swallow them. °If you have diabetes, make sure that you are scheduled to have your procedure done first thing in the morning, whenever possible. °If you have diabetes, take only half of your insulin dose and notify our nurse that you have done so as soon as you arrive at the clinic. °If you are diabetic, but only take blood sugar pills (oral hypoglycemic), then do not take them on the morning of your procedure.  You may take them after you have had the procedure. °Do not take aspirin or any aspirin-containing medications, at least eleven (11) days prior to the procedure.  They may prolong bleeding. °Wear loose fitting clothing that may be easy to take off and that you would not mind if it got stained with Betadine or blood. °Do not wear any jewelry or perfume °Remove any nail coloring.  It will interfere with some of our monitoring equipment. ° °NOTE: Remember that this is not meant to be interpreted as a complete list of all possible complications.  Unforeseen problems may occur. ° °BLOOD THINNERS °The following drugs contain aspirin or other products,  which can cause increased bleeding during surgery and should not be taken for 2 weeks prior to and 1 week after surgery.  If you should need take something for relief of minor pain, you may take acetaminophen which is found in Tylenol,m Datril, Anacin-3 and Panadol. It is not blood thinner. The products listed below are.  Do not take any of the products listed below in addition to any listed on your instruction sheet. ° °A.P.C or A.P.C with Codeine Codeine Phosphate Capsules #3 Ibuprofen Ridaura  °ABC compound Congesprin Imuran rimadil  °Advil Cope Indocin Robaxisal  °Alka-Seltzer Effervescent Pain Reliever and Antacid Coricidin or Coricidin-D ° Indomethacin Rufen  °Alka-Seltzer plus Cold Medicine Cosprin Ketoprofen S-A-C Tablets  °Anacin Analgesic Tablets or Capsules Coumadin   Korlgesic Salflex  Anacin Extra Strength Analgesic tablets or capsules CP-2 Tablets Lanoril Salicylate  Anaprox Cuprimine Capsules Levenox Salocol  Anexsia-D Dalteparin Magan Salsalate  Anodynos Darvon compound Magnesium Salicylate Sine-off  Ansaid Dasin Capsules Magsal Sodium Salicylate  Anturane Depen Capsules Marnal Soma  APF Arthritis pain formula Dewitt's Pills Measurin Stanback  Argesic Dia-Gesic Meclofenamic Sulfinpyrazone  Arthritis Bayer Timed Release Aspirin Diclofenac Meclomen Sulindac  Arthritis pain formula Anacin Dicumarol Medipren Supac  Analgesic (Safety coated) Arthralgen Diffunasal Mefanamic Suprofen  Arthritis Strength Bufferin Dihydrocodeine Mepro Compound Suprol  Arthropan liquid Dopirydamole Methcarbomol with Aspirin Synalgos  ASA tablets/Enseals Disalcid Micrainin Tagament  Ascriptin Doan's Midol Talwin  Ascriptin A/D Dolene Mobidin Tanderil  Ascriptin Extra Strength Dolobid Moblgesic Ticlid  Ascriptin with Codeine Doloprin or Doloprin with Codeine Momentum Tolectin  Asperbuf Duoprin Mono-gesic Trendar  Aspergum Duradyne Motrin or Motrin IB Triminicin  Aspirin plain, buffered or enteric coated  Durasal Myochrisine Trigesic  Aspirin Suppositories Easprin Nalfon Trillsate  Aspirin with Codeine Ecotrin Regular or Extra Strength Naprosyn Uracel  Atromid-S Efficin Naproxen Ursinus  Auranofin Capsules Elmiron Neocylate Vanquish  Axotal Emagrin Norgesic Verin  Azathioprine Empirin or Empirin with Codeine Normiflo Vitamin E  Azolid Emprazil Nuprin Voltaren  Bayer Aspirin plain, buffered or children's or timed BC Tablets or powders Encaprin Orgaran Warfarin Sodium  Buff-a-Comp Enoxaparin Orudis Zorpin  Buff-a-Comp with Codeine Equegesic Os-Cal-Gesic   Buffaprin Excedrin plain, buffered or Extra Strength Oxalid   Bufferin Arthritis Strength Feldene Oxphenbutazone   Bufferin plain or Extra Strength Feldene Capsules Oxycodone with Aspirin   Bufferin with Codeine Fenoprofen Fenoprofen Pabalate or Pabalate-SF   Buffets II Flogesic Panagesic   Buffinol plain or Extra Strength Florinal or Florinal with Codeine Panwarfarin   Buf-Tabs Flurbiprofen Penicillamine   Butalbital Compound Four-way cold tablets Penicillin   Butazolidin Fragmin Pepto-Bismol   Carbenicillin Geminisyn Percodan   Carna Arthritis Reliever Geopen Persantine   Carprofen Gold's salt Persistin   Chloramphenicol Goody's Phenylbutazone   Chloromycetin Haltrain Piroxlcam   Clmetidine heparin Plaquenil   Cllnoril Hyco-pap Ponstel   Clofibrate Hydroxy chloroquine Propoxyphen         Before stopping any of these medications, be sure to consult the physician who ordered them.  Some, such as Coumadin (Warfarin) are ordered to prevent or treat serious conditions such as "deep thrombosis", "pumonary embolisms", and other heart problems.  The amount of time that you may need off of the medication may also vary with the medication and the reason for which you were taking it.  If you are taking any of these medications, please make sure you notify your pain physician before you undergo any procedures.         Facet Blocks Patient  Information  Description: The facets are joints in the spine between the vertebrae.  Like any joints in the body, facets can become irritated and painful.  Arthritis can also effect the facets.  By injecting steroids and local anesthetic in and around these joints, we can temporarily block the nerve supply to them.  Steroids act directly on irritated nerves and tissues to reduce selling and inflammation which often leads to decreased pain.  Facet blocks may be done anywhere along the spine from the neck to the low back depending upon the location of your pain.   After numbing the skin with local anesthetic (like Novocaine), a small needle is passed onto the facet joints under x-ray guidance.  You may experience a sensation of pressure while this is being done.  The   entire block usually lasts about 15-25 minutes.   Conditions which may be treated by facet blocks:  Low back/buttock pain Neck/shoulder pain Certain types of headaches  Preparation for the injection:  Do not eat any solid food or dairy products within 8 hours of your appointment. You may drink clear liquid up to 3 hours before appointment.  Clear liquids include water, black coffee, juice or soda.  No milk or cream please. You may take your regular medication, including pain medications, with a sip of water before your appointment.  Diabetics should hold regular insulin (if taken separately) and take 1/2 normal NPH dose the morning of the procedure.  Carry some sugar containing items with you to your appointment. A driver must accompany you and be prepared to drive you home after your procedure. Bring all your current medications with you. An IV may be inserted and sedation may be given at the discretion of the physician. A blood pressure cuff, EKG and other monitors will often be applied during the procedure.  Some patients may need to have extra oxygen administered for a short period. You will be asked to provide medical information,  including your allergies and medications, prior to the procedure.  We must know immediately if you are taking blood thinners (like Coumadin/Warfarin) or if you are allergic to IV iodine contrast (dye).  We must know if you could possible be pregnant.  Possible side-effects:  Bleeding from needle site Infection (rare, may require surgery) Nerve injury (rare) Numbness & tingling (temporary) Difficulty urinating (rare, temporary) Spinal headache (a headache worse with upright posture) Light-headedness (temporary) Pain at injection site (serveral days) Decreased blood pressure (rare, temporary) Weakness in arm/leg (temporary) Pressure sensation in back/neck (temporary)   Call if you experience:  Fever/chills associated with headache or increased back/neck pain Headache worsened by an upright position New onset, weakness or numbness of an extremity below the injection site Hives or difficulty breathing (go to the emergency room) Inflammation or drainage at the injection site(s) Severe back/neck pain greater than usual New symptoms which are concerning to you  Please note:  Although the local anesthetic injected can often make your back or neck feel good for several hours after the injection, the pain will likely return. It takes 3-7 days for steroids to work.  You may not notice any pain relief for at least one week.  If effective, we will often do a series of 2-3 injections spaced 3-6 weeks apart to maximally decrease your pain.  After the initial series, you may be a candidate for a more permanent nerve block of the facets.  If you have any questions, please call [phone]  [facility] 

## 2021-06-20 ENCOUNTER — Encounter: Payer: Self-pay | Admitting: Student in an Organized Health Care Education/Training Program

## 2021-06-23 ENCOUNTER — Encounter: Payer: Self-pay | Admitting: *Deleted

## 2021-06-26 ENCOUNTER — Ambulatory Visit (INDEPENDENT_AMBULATORY_CARE_PROVIDER_SITE_OTHER): Payer: Medicare Other | Admitting: Gastroenterology

## 2021-06-26 ENCOUNTER — Encounter: Payer: Self-pay | Admitting: Gastroenterology

## 2021-06-26 ENCOUNTER — Encounter: Payer: Self-pay | Admitting: Student in an Organized Health Care Education/Training Program

## 2021-06-26 ENCOUNTER — Other Ambulatory Visit: Payer: Self-pay

## 2021-06-26 VITALS — BP 139/87 | HR 68 | Temp 98.1°F | Ht 69.0 in | Wt 150.6 lb

## 2021-06-26 DIAGNOSIS — K641 Second degree hemorrhoids: Secondary | ICD-10-CM

## 2021-06-26 DIAGNOSIS — R1013 Epigastric pain: Secondary | ICD-10-CM | POA: Diagnosis not present

## 2021-06-26 NOTE — Progress Notes (Signed)
[name] MD, MRCP(U.K) [address]  [place]  [place], [place] [address]  Main: [phone]  Fax: [phone]   Primary Care Physician: [name], NP  Primary Gastroenterologist:  Dr. [name]   Chief complaint follow-up of hemorrhoids   HPI: Grant Young is a 75 y.o. male  Previously had 3 rounds of hemorrhoidal banding not responded to conservative management.  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  This is after cholecystectomy.  Last set of bands were placed in April 2022.  He  states that he was doing well from the hemorrhoids point of view, recently developed some constipation after going on narcotics.  Since then he has been having some bleeding on the tissue paper.  Some perianal itching and discomfort.  He also says he wakes up in the morning for the past few weeks and feels like a lump in his abdomen.  He takes Aciphex twice a day.  Not always before his breakfast on an empty stomach.  PROCEDURE NOTE: The patient presents with symptomatic grade 1 hemorrhoids, unresponsive to maximal medical therapy, requesting rubber band ligation of his/her hemorrhoidal disease.  All risks, benefits and alternative forms of therapy were described and informed consent was obtained.  In the Left Lateral Decubitus position (if anoscopy is performed) anoscopic examination revealed grade 1 hemorrhoids in the RP position(s).   The decision was made to band the RP internal hemorrhoid, and the [place] was used to perform band ligation without complication.  Digital anorectal examination was then performed to assure proper positioning of the band, and to adjust the banded tissue as required.  The patient was discharged home without pain or other issues.  Dietary and behavioral recommendations were given and (if necessary - prescriptions were given), along with follow-up instructions.  The patient will return as needed for follow-up and  possible additional banding as required.  No complications were encountered and the patient tolerated the procedure well.   Current Outpatient Medications  Medication Sig Dispense Refill   amitriptyline (ELAVIL) 10 MG tablet Take 3 tablets (30 mg total) by mouth at bedtime. (Patient taking differently: Take 20 mg by mouth at bedtime.) 270 tablet 1   anastrozole (ARIMIDEX) 1 MG tablet Take by mouth daily.     butalbital-acetaminophen-caffeine (FIORICET) 50-325-40 MG tablet Take by mouth.     chlorthalidone (HYGROTON) 25 MG tablet Take 1 tablet (25 mg total) by mouth daily. 90 tablet 0   Cholecalciferol 125 MCG (5000 UT) TABS Take by mouth.     cholestyramine (QUESTRAN) 4 g packet Take 1 packet (4 g total) by mouth 3 (three) times daily. (Patient taking differently: Take 4 g by mouth 3 (three) times daily as needed.) 270 packet 3   Cyanocobalamin (VITAMIN B-12) 5000 MCG LOZG Take 1 lozenge by mouth daily.      dicyclomine (BENTYL) 20 MG tablet Take 20 mg by mouth as needed.     Digestive Aids Mixture (DIGESTION GB PO) Take 1 capsule by mouth daily.     escitalopram (LEXAPRO) 20 MG tablet Take 1 tablet (20 mg total) by mouth daily. 90 tablet 1   ezetimibe (ZETIA) 10 MG tablet Take 1 tablet (10 mg total) by mouth daily. 90 tablet 1   GLUTATHIONE PO Take 1 tablet by mouth daily.     labetalol (NORMODYNE) 100 MG tablet Take 100 mg by mouth 2 (two) times daily.     Loperamide HCl (IMODIUM PO) Take  by mouth as needed.      LORazepam (ATIVAN) 1 MG tablet Take 1 tablet (1 mg total) by mouth 2 (two) times daily. 60 tablet 0   melatonin 5 MG TABS Take 5 mg by mouth at bedtime.     NONFORMULARY OR COMPOUNDED ITEM Super Bi-Mix Injection  Papaverine HCI 30mg /mL Phentolamine Mesylate 1mg /mL Inject 0.46mL - 74mL Increase as needed to achieve erection (Patient taking differently: Super Bi-Mix Injection 150mg /5mg  Papaverine HCI 30mg /mL Phentolamine Mesylate 1mg /mL Inject 0.51mL - 60mL Increase as needed to  achieve erection) 5 each 6   ondansetron (ZOFRAN) 4 MG tablet Take 4 mg by mouth as needed for nausea or vomiting.      OVER THE COUNTER MEDICATION G I Detox  - 2x/week Gastromend HP Daily Glutaloemine Daily Lithium Oronate Daily GABA Liposomal Daily Fungus Eliminator     Potassium 99 MG TABS Take by mouth 3 (three) times a week. K-Zyme     Probiotic Product (PROBIOTIC DAILY PO) Take by mouth.     prochlorperazine (COMPAZINE) 10 MG tablet Take by mouth.     RABEprazole (ACIPHEX) 20 MG tablet Take 1 tablet (20 mg total) by mouth 2 (two) times daily. 180 tablet 1   Rhodiola rosea (RHODIOLA PO) Take by mouth.     sucralfate (CARAFATE) 1 g tablet Take 1 g by mouth 4 (four) times daily as needed.      tapentadol (NUCYNTA) 50 MG tablet Take 1 tablet (50 mg total) by mouth every 8 (eight) hours as needed for moderate pain or severe pain. Do not take if you have epilepsy or a history of seizures. Swallow tablets whole. Do not chew, crush or dissolve. 90 tablet 0   testosterone cypionate (DEPOTESTOSTERONE CYPIONATE) 200 MG/ML injection Inject 60 mg into the muscle every 14 (fourteen) days.     Theanine 50 MG TBDP Take by mouth at bedtime.     tizanidine (ZANAFLEX) 2 MG capsule Take 1 capsule (2 mg total) by mouth every 12 (twelve) hours as needed. 180 capsule 1   UBRELVY 50 MG TABS Take 1 tablet by mouth daily.     valACYclovir (VALTREX) 1000 MG tablet TAKE 2 TABLETS BY MOUTH AND REPEAT IN 12 HOURS FOR COLD SORE 4 tablet 1   Vibegron (GEMTESA) 75 MG TABS Take 75 mg by mouth daily. 90 tablet 3   No current facility-administered medications for this visit.    Allergies as of 06/26/2021 - Review Complete 06/26/2021  Allergen Reaction Noted   Ace inhibitors  08/24/2019   Fluoxetine Anxiety 10/31/2019   Metoclopramide  10/31/2019   Nalbuphine  10/31/2019   Other  01/03/2014   Amoxicillin-pot clavulanate Nausea Only 10/31/2019   Doxazosin Rash 02/01/2013   Duloxetine Nausea Only 10/31/2019    Penicillins Nausea Only 10/31/2019   Tamsulosin Itching and Anxiety 02/01/2013   Trazodone and nefazodone Itching, Anxiety, and Rash 02/13/2012   Amlodipine  10/31/2019   Cinoxacin  10/31/2019   Ciprofloxacin  08/24/2019   Nebivolol  08/24/2019   Olanzapine  10/31/2019   Olmesartan  08/24/2019   Pregabalin  10/31/2019   Prostaglandins  05/07/2014   Thyroid hormones  06/09/2018   Zolpidem  10/31/2019   Duloxetine hcl  01/03/2014   Fluoxetine hcl  01/03/2014   Phenytoin Anxiety 10/31/2019    ROS:  General: Negative for anorexia, weight loss, fever, chills, fatigue, weakness. ENT: Negative for hoarseness, difficulty swallowing , nasal congestion. CV: Negative for chest pain, angina, palpitations, dyspnea on exertion, peripheral edema.  Respiratory:  Negative for dyspnea at rest, dyspnea on exertion, cough, sputum, wheezing.  GI: See history of present illness. GU:  Negative for dysuria, hematuria, urinary incontinence, urinary frequency, nocturnal urination.  Endo: Negative for unusual weight change.    Physical Examination:   BP 139/87   Pulse 68   Temp 98.1 F (36.7 C) (Oral)   Ht 5\' 9"  (1.753 m)   Wt 150 lb 9.6 oz (68.3 kg)   BMI 22.24 kg/m   General: Well-nourished, well-developed in no acute distress.  Eyes: No icterus. Conjunctivae pink. Mouth: Oropharyngeal mucosa moist and pink , no lesions erythema or exudate. Lungs: Clear to auscultation bilaterally. Non-labored. Heart: Regular rate and rhythm, no murmurs rubs or gallops.  Abdomen: Bowel sounds are normal, nontender, nondistended, no hepatosplenomegaly or masses, no abdominal bruits or hernia , no rebound or guarding.   Extremities: No lower extremity edema. No clubbing or deformities. Neuro: Alert and oriented x 3.  Grossly intact. Skin: Warm and dry, no jaundice.   Psych: Alert and cooperative, normal mood and affect.   Imaging Studies: No results found.  Assessment and Plan:   Grant Young is a 75  y.o. y/o male here to follow-up for his hemorrhoids.  Has become symptomatic after his last set of banding.  I have banded his right posterior column.  Plan 1.  Avoid constipation, high-fiber diet, MiraLAX if needed for the constipation 2.  Taking Aciphex daily 30 minutes before breakfast and before dinner.  If symptoms of abdominal discomfort persist then call my office to schedule a follow-up.  It is possible the narcotics is taking are causing a degree of gastroparesis and discomfort.  Dr [name]  MD,MRCP [facility]) Follow up in 3 months

## 2021-06-28 ENCOUNTER — Encounter: Payer: Self-pay | Admitting: Internal Medicine

## 2021-06-29 DIAGNOSIS — M19011 Primary osteoarthritis, right shoulder: Secondary | ICD-10-CM | POA: Insufficient documentation

## 2021-06-29 DIAGNOSIS — S40011A Contusion of right shoulder, initial encounter: Secondary | ICD-10-CM | POA: Insufficient documentation

## 2021-06-30 MED ORDER — LORAZEPAM 1 MG PO TABS: 1.0000 mg | ORAL_TABLET | Freq: Two times a day (BID) | ORAL | 0 refills | Status: DC

## 2021-07-04 ENCOUNTER — Ambulatory Visit: Payer: Self-pay

## 2021-07-04 NOTE — Telephone Encounter (Signed)
Pt. Reports he has dizziness and vertigo for 3-4 weeks. Seems to be getting worse. Has tried Dramamine, has not helped.No other symptoms. Seems to be worse when changing positions. Appointment made for Monday.Reviewed care advise.     Reason for Disposition  [1] MODERATE dizziness (e.g., interferes with normal activities) AND [2] has NOT been evaluated by physician for this  (Exception: dizziness caused by heat exposure, sudden standing, or poor fluid intake)  Answer Assessment - Initial Assessment Questions 1. DESCRIPTION: "Describe your dizziness."     Dizzy 2. LIGHTHEADED: "Do you feel lightheaded?" (e.g., somewhat faint, woozy, weak upon standing)     Dizzy 3. VERTIGO: "Do you feel like either you or the room is spinning or tilting?" (i.e. vertigo)     Yes 4. SEVERITY: "How bad is it?"  "Do you feel like you are going to faint?" "Can you stand and walk?"   - MILD: Feels slightly dizzy, but walking normally.   - MODERATE: Feels unsteady when walking, but not falling; interferes with normal activities (e.g., school, work).   - SEVERE: Unable to walk without falling, or requires assistance to walk without falling; feels like passing out now.      Moderate 5. ONSET:  "When did the dizziness begin?"     3 weeks ago 6. AGGRAVATING FACTORS: "Does anything make it worse?" (e.g., standing, change in head position)     Changing position 7. HEART RATE: "Can you tell me your heart rate?" "How many beats in 15 seconds?"  (Note: not all patients can do this)       No 8. CAUSE: "What do you think is causing the dizziness?"     Unsure 9. RECURRENT SYMPTOM: "Have you had dizziness before?" If Yes, ask: "When was the last time?" "What happened that time?"     No 10. OTHER SYMPTOMS: "Do you have any other symptoms?" (e.g., fever, chest pain, vomiting, diarrhea, bleeding)       No 11. PREGNANCY: "Is there any chance you are pregnant?" "When was your last menstrual period?"       N/A  Protocols  used: Dizziness - Lightheadedness-A-AH

## 2021-07-06 ENCOUNTER — Other Ambulatory Visit: Payer: Self-pay | Admitting: Internal Medicine

## 2021-07-06 DIAGNOSIS — F419 Anxiety disorder, unspecified: Secondary | ICD-10-CM

## 2021-07-06 NOTE — Telephone Encounter (Signed)
Requested Prescriptions  Pending Prescriptions Disp Refills  . escitalopram (LEXAPRO) 20 MG tablet [Pharmacy Med Name: ESCITALOPRAM 20 MG TABLET] 90 tablet 0    Sig: TAKE 1 TABLET BY MOUTH EVERY DAY     Psychiatry:  Antidepressants - SSRI Passed - 07/06/2021  9:27 AM      Passed - Completed PHQ-2 or PHQ-9 in the last 360 days      Passed - Valid encounter within last 6 months    Recent Outpatient Visits          3 months ago HTN (hypertension), benign   [facility] [place], [name], NP   5 months ago Essential hypertension   [place], NP      Future Appointments            Tomorrow Garnette Gunner, [name], NP [facility], [place]   In 2 months [name], MD [place]   In 4 months [place], [name], NP [facility], [place]   In 5 months Diamantina [place], Herbert Seta, [place] [address]

## 2021-07-07 ENCOUNTER — Encounter: Payer: Self-pay | Admitting: Internal Medicine

## 2021-07-07 ENCOUNTER — Ambulatory Visit (INDEPENDENT_AMBULATORY_CARE_PROVIDER_SITE_OTHER): Payer: Medicare Other | Admitting: Internal Medicine

## 2021-07-07 ENCOUNTER — Other Ambulatory Visit: Payer: Self-pay

## 2021-07-07 VITALS — BP 130/79 | HR 70 | Temp 98.0°F | Resp 17 | Ht 69.0 in | Wt 147.0 lb

## 2021-07-07 DIAGNOSIS — I951 Orthostatic hypotension: Secondary | ICD-10-CM | POA: Diagnosis not present

## 2021-07-07 DIAGNOSIS — R42 Dizziness and giddiness: Secondary | ICD-10-CM | POA: Diagnosis not present

## 2021-07-07 MED ORDER — FLUDROCORTISONE ACETATE 0.1 MG PO TABS: 0.1000 mg | ORAL_TABLET | Freq: Every day | ORAL | 0 refills | Status: DC

## 2021-07-07 NOTE — Patient Instructions (Signed)
Orthostatic Hypotension Blood pressure is a measurement of how strongly, or weakly, your circulating blood is pressing against the walls of your arteries. Orthostatic hypotension is a drop in blood pressure that can happen when you change positions, such as when you go from lying down to standing. Arteries are blood vessels that carry blood from your heart throughout your body. When blood pressure is too low, you may not get enough blood to your brain or to the rest of your organs. Orthostatic hypotension can cause light-headedness, sweating, rapid heartbeat, blurred vision, and fainting. These symptoms require further investigation into the cause. What are the causes? Orthostatic hypotension can be caused by many things, including: Sudden changes in posture, such as standing up quickly after you have been sitting or lying down. Loss of blood (anemia) or loss of body fluids (dehydration). Heart problems, neurologic problems, or hormone problems. Pregnancy. Aging. The risk for this condition increases as you get older. Severe infection (sepsis). Certain medicines, such as medicines for high blood pressure or medicines that make the body lose excess fluids (diuretics). What are the signs or symptoms? Symptoms of this condition may include: Weakness, light-headedness, or dizziness. Sweating. Blurred vision. Tiredness (fatigue). Rapid heartbeat. Fainting, in severe cases. How is this diagnosed? This condition is diagnosed based on: Your symptoms and medical history. Your blood pressure measurements. Your health care provider will check your blood pressure when you are: Lying down. Sitting. Standing. A blood pressure reading is recorded as two numbers, such as "120 over 80" (or 120/80). The first ("top") number is called the systolic pressure. It is a measure of the pressure in your arteries as your heart beats. The second ("bottom") number is called the diastolic pressure. It is a measure of  the pressure in your arteries when your heart relaxes between beats. Blood pressure is measured in a unit called mmHg. Healthy blood pressure for most adults is 120/80 mmHg. Orthostatic hypotension is defined as a 20 mmHg drop in systolic pressure or a 10 mmHg drop in diastolic pressure within 3 minutes of standing. Other information or tests that may be used to diagnose orthostatic hypotension include: Your other vital signs, such as your heart rate and temperature. Blood tests. An electrocardiogram (ECG) or echocardiogram. A Holter monitor. This is a device you wear that records your heart rhythm continuously, usually for 24-48 hours. Tilt table test. For this test, you will be safely secured to a table that moves you from a lying position to an upright position. Your heart rhythm and blood pressure will be monitored during the test. How is this treated? This condition may be treated by: Changing your diet. This may involve eating more salt (sodium) or drinking more water. Changing the dosage of certain medicines you are taking that might be lowering your blood pressure. Correcting the underlying reason for the orthostatic hypotension. Wearing compression stockings. Taking medicines to raise your blood pressure. Avoiding actions that trigger symptoms. Follow these instructions at home: Medicines Take over-the-counter and prescription medicines only as told by your health care provider. Follow instructions from your health care provider about changing the dosage of your current medicines, if this applies. Do not stop or adjust any of your medicines on your own. Eating and drinking  Drink enough fluid to keep your urine pale yellow. Eat extra salt only as directed. Do not add extra salt to your diet unless advised by your health care provider. Eat frequent, small meals. Avoid standing up suddenly after eating. General instructions    Get up slowly from lying down or sitting positions. This  gives your blood pressure a chance to adjust. Avoid hot showers and excessive heat as directed by your health care provider. Engage in regular physical activity as directed by your health care provider. If you have compression stockings, wear them as told. Keep all follow-up visits. This is important. Contact a health care provider if: You have a fever for more than 2-3 days. You feel more thirsty than usual. You feel dizzy or weak. Get help right away if: You have chest pain. You have a fast or irregular heartbeat. You become sweaty or feel light-headed. You feel short of breath. You faint. You have any symptoms of a stroke. "BE FAST" is an easy way to remember the main warning signs of a stroke: B - Balance. Signs are dizziness, sudden trouble walking, or loss of balance. E - Eyes. Signs are trouble seeing or a sudden change in vision. F - Face. Signs are sudden weakness or numbness of the face, or the face or eyelid drooping on one side. A - Arms. Signs are weakness or numbness in an arm. This happens suddenly and usually on one side of the body. S - Speech. Signs are sudden trouble speaking, slurred speech, or trouble understanding what people say. T - Time. Time to call emergency services. Write down what time symptoms started. You have other signs of a stroke, such as: A sudden, severe headache with no known cause. Nausea or vomiting. Seizure. These symptoms may represent a serious problem that is an emergency. Do not wait to see if the symptoms will go away. Get medical help right away. Call your local emergency services (911 in the [place] Do not drive yourself to the hospital. Summary Orthostatic hypotension is a sudden drop in blood pressure. It can cause light-headedness, sweating, rapid heartbeat, blurred vision, and fainting. Orthostatic hypotension can be diagnosed by having your blood pressure taken while lying down, sitting, and then standing. Treatment may involve  changing your diet, wearing compression stockings, sitting up slowly, adjusting your medicines, or correcting the underlying reason for the orthostatic hypotension. Get help right away if you have chest pain, a fast or irregular heartbeat, or symptoms of a stroke. This information is not intended to replace advice given to you by your health care provider. Make sure you discuss any questions you have with your health care provider. Document Revised: 11/14/2020 Document Reviewed: 11/14/2020 Elsevier Patient Education  2022 Elsevier Inc.  

## 2021-07-07 NOTE — Progress Notes (Signed)
Subjective:    Patient ID: Grant Young, male    DOB: 27-Oct-1945, 75 y.o.   MRN: [id number]  HPI  Pt presents to the clinic today with c/o dizziness. He reports this started 3 weeks ago. He notices it mainly with position changes. He feels unsteady and like the room is spinning. He feels like he leans towards the right and feels like he may fall but reports he has not had a syncopal episode. He is on multiple sedating medications such as Amitriptyline, Fioricet, Escitalopram, Hydrocodone, Lorazepam, Chlorathilodone, and Propranolol. He reports 1 month ago he was started on Nucynta but had to stop this medication due to side effects of worsening dizziness. He reports once he stopped the Nucynta the dizziness improved slightly but still occurs daily. He reports he drinks water all day long.  Review of Systems  Past Medical History:  Diagnosis Date   Anxiety    Per New Patient Packet   Chronic back pain    Per New Patient Packet   Chronic heart disease    Per New Patient Packet   Depression    GERD (gastroesophageal reflux disease)    Headache    History of CT scan of brain 09/14/2018   Per New Patient Packet   History of depression    Per New Patient Packet   History of gastritis    Per New Patient Packet   History of headache    Per New Patient Packet   History of kidney stones    Per New Patient Packet   History of neuropathy    Per New Patient Packet   Hypertension    Per New Patient Packet   Insomnia    Kidney stone    Lumbar radicular pain    Per New Patient Packet   Lyme disease    Per New Patient Packet   Myocardial infarction [facility])    Overactive bladder    PONV (postoperative nausea and vomiting)    Skin cancer, basal cell    Sleep trouble    Per New Patient Packet   Spinal cord stimulator status    01/08/21 - not currently using.   Squamous cell skin cancer    Substance abuse (HCC)     Current Outpatient Medications  Medication Sig Dispense Refill    amitriptyline (ELAVIL) 10 MG tablet Take 3 tablets (30 mg total) by mouth at bedtime. (Patient taking differently: Take 20 mg by mouth at bedtime.) 270 tablet 1   anastrozole (ARIMIDEX) 1 MG tablet Take by mouth daily.     butalbital-acetaminophen-caffeine (FIORICET) 50-325-40 MG tablet Take by mouth.     chlorthalidone (HYGROTON) 25 MG tablet Take 1 tablet (25 mg total) by mouth daily. 90 tablet 0   Cholecalciferol 125 MCG (5000 UT) TABS Take by mouth.     cholestyramine (QUESTRAN) 4 g packet Take 1 packet (4 g total) by mouth 3 (three) times daily. (Patient taking differently: Take 4 g by mouth 3 (three) times daily as needed.) 270 packet 3   Cyanocobalamin (VITAMIN B-12) 5000 MCG LOZG Take 1 lozenge by mouth daily.      dicyclomine (BENTYL) 20 MG tablet Take 20 mg by mouth as needed.     Digestive Aids Mixture (DIGESTION GB PO) Take 1 capsule by mouth daily.     escitalopram (LEXAPRO) 20 MG tablet TAKE 1 TABLET BY MOUTH EVERY DAY 90 tablet 0   ezetimibe (ZETIA) 10 MG tablet Take 1 tablet (10 mg total) by mouth  daily. 90 tablet 1   GLUTATHIONE PO Take 1 tablet by mouth daily.     labetalol (NORMODYNE) 100 MG tablet Take 100 mg by mouth 2 (two) times daily.     Loperamide HCl (IMODIUM PO) Take by mouth as needed.      LORazepam (ATIVAN) 1 MG tablet Take 1 tablet (1 mg total) by mouth 2 (two) times daily. 60 tablet 0   melatonin 5 MG TABS Take 5 mg by mouth at bedtime.     NONFORMULARY OR COMPOUNDED ITEM Super Bi-Mix Injection  Papaverine HCI 30mg /mL Phentolamine Mesylate 1mg /mL Inject 0.80mL - 38mL Increase as needed to achieve erection (Patient taking differently: Super Bi-Mix Injection 150mg /5mg  Papaverine HCI 30mg /mL Phentolamine Mesylate 1mg /mL Inject 0.27mL - 23mL Increase as needed to achieve erection) 5 each 6   ondansetron (ZOFRAN) 4 MG tablet Take 4 mg by mouth as needed for nausea or vomiting.      OVER THE COUNTER MEDICATION G I Detox  - 2x/week Gastromend HP Daily Glutaloemine  Daily Lithium Oronate Daily GABA Liposomal Daily Fungus Eliminator     Potassium 99 MG TABS Take by mouth 3 (three) times a week. K-Zyme     Probiotic Product (PROBIOTIC DAILY PO) Take by mouth.     prochlorperazine (COMPAZINE) 10 MG tablet Take by mouth.     RABEprazole (ACIPHEX) 20 MG tablet Take 1 tablet (20 mg total) by mouth 2 (two) times daily. 180 tablet 1   Rhodiola rosea (RHODIOLA PO) Take by mouth.     sucralfate (CARAFATE) 1 g tablet Take 1 g by mouth 4 (four) times daily as needed.      tapentadol (NUCYNTA) 50 MG tablet Take 1 tablet (50 mg total) by mouth every 8 (eight) hours as needed for moderate pain or severe pain. Do not take if you have epilepsy or a history of seizures. Swallow tablets whole. Do not chew, crush or dissolve. 90 tablet 0   testosterone cypionate (DEPOTESTOSTERONE CYPIONATE) 200 MG/ML injection Inject 60 mg into the muscle every 14 (fourteen) days.     Theanine 50 MG TBDP Take by mouth at bedtime.     tizanidine (ZANAFLEX) 2 MG capsule Take 1 capsule (2 mg total) by mouth every 12 (twelve) hours as needed. 180 capsule 1   UBRELVY 50 MG TABS Take 1 tablet by mouth daily.     valACYclovir (VALTREX) 1000 MG tablet TAKE 2 TABLETS BY MOUTH AND REPEAT IN 12 HOURS FOR COLD SORE 4 tablet 1   Vibegron (GEMTESA) 75 MG TABS Take 75 mg by mouth daily. 90 tablet 3   No current facility-administered medications for this visit.    Allergies  Allergen Reactions   Ace Inhibitors     Other reaction(s): Cough   Fluoxetine Anxiety    "made me fall asleep" per pt "bad headaches and "makes  Me  Crazy" historical allergy noted in [employer] "made me fall asleep" per pt "bad headaches and "makes  Me  Crazy" Per New Patient Packet.     Metoclopramide     Other reaction(s): Other (See Comments), Other (See Comments), Unknown Tardive Dyskinesia  historical allergy noted in [employer] Tardive Dyskinesia  Per New Patient Packet.   Nalbuphine     Used Post Back surgery-  Anesthesiologist Error. Patient had Narcotic Withdraw. Per New Patient Packet.    Other     Other reaction(s): Other (See Comments) Altered mental status in combo with narcotics at previous hospitalization - Full Withdrawal Symptoms Other reaction(s): Rash  Amoxicillin-Pot Clavulanate Nausea Only    Per New Patient Packet.   Doxazosin Rash    Other reaction(s): Other - See Comments, Rash UNKNOWN REACTION UNKNOWN REACTION    Duloxetine Nausea Only    Per New Patient Packet.    Penicillins Nausea Only    Per New Patient Packet.   Tamsulosin Itching and Anxiety    Restless, Flushing, Heavy Chest, Itching, Hyperactive mood and Anxiety. Unable to handle side effects. Per New Patient Packet.     Trazodone And Nefazodone Itching, Anxiety and Rash    Headache. "INCREASED MY ANXIETY AND HEARTRATE" Flushing, tachycardia "INCREASED MY ANXIETY AND HEARTRATE" Per New Patient Packet.    Amlodipine     Shaking, unsure of reaction. Per New Patient Packet.     Cinoxacin     GI Intolerance, and Dizziness. Per New Patient Packet.    Ciprofloxacin     Other reaction(s): Unknown   Nebivolol     Other reaction(s): Unknown   Olanzapine     Headache and unable to sleep for 3 nights. Per New Patient Packet.    Olmesartan     Other reaction(s): Unknown   Pregabalin     Confusion, Lack of concentration, dizziness, and likely drowsiness. Per New Patient Packet.     Prostaglandins     Other reaction(s): Other (See Comments) Intolerance   Thyroid Hormones     Other reaction(s): Other (See Comments) Thyroid (Nature Thyroid) contraindicated with some of your other medications.   Zolpidem     Nightmares, Ineffective after 2 days. Per New Patient Packet.    Duloxetine Hcl     Other reaction(s): Rash   Fluoxetine Hcl     Other reaction(s): Rash   Phenytoin Anxiety    Hyperactivity, and Ineffective. Per New Patient Packet.     Family History  Problem Relation Age of Onset   Heart disease  Father    Heart failure Father    Hypertension Father    Stroke Father    Stroke Mother    Dementia Mother    Kidney Stones Daughter    Anxiety disorder Daughter    OCD Daughter     Social History   Socioeconomic History   Marital status: Married    Spouse name: Not on file   Number of children: Not on file   Years of education: Not on file   Highest education level: Not on file  Occupational History   Not on file  Tobacco Use   Smoking status: Never   Smokeless tobacco: Never  Vaping Use   Vaping Use: Never used  Substance and Sexual Activity   Alcohol use: Yes    Comment: 1 Drink a Month, socially   Drug use: Not Currently   Sexual activity: Yes    Birth control/protection: None  Other Topics Concern   Not on file  Social History Narrative   Tobacco use, amount per day now: None   Past tobacco use, amount per day: None   How many years did you use tobacco: 0   Alcohol use (drinks per week): 0-1 Month   Diet:   Do you drink/eat things with caffeine: Occasionally ( Hot Chocolate and maybe 1/4 of 16oz Pepsi 2-3 times a week.   Marital status: Married                                  What year were you married? 1970  Do you live in a house, apartment, assisted living, condo, trailer, etc.? House   Is it one or more stories? One   How many persons live in your home? 2   Do you have pets in your home?( please list)  No   Highest Level of education completed: Masters   Current or past profession: Intensive Transport planner for [employer]   Do you exercise? Yes                                    Type and how often? [place], Recumbent Bike, 3 times a week. Try to get a 1.2 mile walk at least 3 times a week or more.     Do you have a living will? Yes   Do you have a DNR form?  No                                 If not, do you want to discuss one?   Do you have signed POA/HPOA forms? Yes                       If so, please bring to you appointment    Do  you have any difficulty bathing or dressing yourself? No    Do you have difficulty preparing food or eating? No   Do you have difficulty managing your medications? No   Do you have any difficulty managing your finances? No   Do you have any difficulty affording your medications? No         Social Determinants of Radio broadcast assistant Strain: Not on file  Food Insecurity: Not on file  Transportation Needs: Not on file  Physical Activity: Not on file  Stress: Not on file  Social Connections: Not on file  Intimate Partner Violence: Not on file     Constitutional: Pt reports frequent headaches. Denies fever, malaise, fatigue, or abrupt weight changes.  HEENT: Denies eye pain, eye redness, ear pain, ringing in the ears, wax buildup, runny nose, nasal congestion, bloody nose, or sore throat. Respiratory: Denies difficulty breathing, shortness of breath, cough or sputum production.   Cardiovascular: Denies chest pain, chest tightness, palpitations or swelling in the hands or feet.  Neurological: Pt reports dizziness. Denies difficulty with memory, difficulty with speech or problems with balance and coordination.    No other specific complaints in a complete review of systems (except as listed in HPI above).     Objective:   Physical Exam BP 130/79 (BP Location: Right Arm, Patient Position: Sitting, Cuff Size: Normal)   Pulse 70   Temp 98 F (36.7 C) (Temporal)   Resp 17   Ht 5\' 9"  (1.753 m)   Wt 147 lb (66.7 kg)   SpO2 99%   BMI 21.71 kg/m   Wt Readings from Last 3 Encounters:  06/26/21 150 lb 9.6 oz (68.3 kg)  06/17/21 147 lb (66.7 kg)  05/15/21 147 lb 11.2 oz (67 kg)    General: Appears his stated age, well developed, well nourished in NAD. HEENT: Head: normal shape and size; Eyes: PERRLA and EOMs intact; Ears: Tm's gray and intact, normal light reflex;  Cardiovascular: Normal rate and rhythm. S1,S2 noted.  No murmur, rubs or gallops noted.  No carotid bruits  noted. Pulmonary/Chest: Normal effort  and positive vesicular breath sounds. No respiratory distress. No wheezes, rales or ronchi noted.  Neurological: Alert and oriented. Cranial nerves II-XII grossly intact. Coordination normal but slowed.     BMET    Component Value Date/Time   NA 145 05/20/2020 0000   K 3.2 (A) 05/20/2020 0000   CL 102 05/20/2020 0000   CO2 34 (A) 05/20/2020 0000   BUN 1 (A) 05/20/2020 0000   CREATININE 0.90 03/06/2020 1134   CALCIUM 9.8 05/20/2020 0000   GFRNONAA 87 05/20/2020 0000   GFRAA 101 05/20/2020 0000    Lipid Panel     Component Value Date/Time   CHOL 170 05/16/2020 0000   TRIG 59 05/16/2020 0000   HDL 56 05/16/2020 0000   LDLCALC 100 05/16/2020 0000    CBC    Component Value Date/Time   WBC 6.2 05/20/2020 0000   RBC 5.21 (A) 05/20/2020 0000   HGB 15.6 05/20/2020 0000   HCT 47 05/20/2020 0000   PLT 199 05/20/2020 0000    Hgb A1C Lab Results  Component Value Date   HGBA1C 5.1 10/17/2019            Assessment & Plan:   Dizziness:  DDx include orthostatic hypotension, medication interaction vs side effects Orthostatics positive Discussed stopping meds that could be causing dizziness by process of elimination but he is hesitant as he does not want to deal with worsening headaches, anxiety etc Will trial Fludrocortisone 0.1mg  daily Encouraged him to continue adequate water intake, make position changes slowly  Update me in 1 month and let me know how you are doing. Webb Silversmith, NP This visit occurred during the SARS-CoV-2 public health emergency.  Safety protocols were in place, including screening questions prior to the visit, additional usage of staff PPE, and extensive cleaning of exam room while observing appropriate contact time as indicated for disinfecting solutions.

## 2021-07-14 ENCOUNTER — Encounter: Payer: Self-pay | Admitting: Internal Medicine

## 2021-07-15 ENCOUNTER — Encounter: Payer: Self-pay | Admitting: Student in an Organized Health Care Education/Training Program

## 2021-07-15 ENCOUNTER — Ambulatory Visit
Payer: Worker's Compensation | Attending: Student in an Organized Health Care Education/Training Program | Admitting: Student in an Organized Health Care Education/Training Program

## 2021-07-15 ENCOUNTER — Other Ambulatory Visit: Payer: Self-pay

## 2021-07-15 VITALS — BP 148/78 | HR 63 | Temp 97.1°F | Resp 18 | Ht 69.0 in | Wt 147.0 lb

## 2021-07-15 DIAGNOSIS — G894 Chronic pain syndrome: Secondary | ICD-10-CM | POA: Diagnosis not present

## 2021-07-15 DIAGNOSIS — G43701 Chronic migraine without aura, not intractable, with status migrainosus: Secondary | ICD-10-CM | POA: Insufficient documentation

## 2021-07-15 DIAGNOSIS — M47816 Spondylosis without myelopathy or radiculopathy, lumbar region: Secondary | ICD-10-CM | POA: Diagnosis not present

## 2021-07-15 DIAGNOSIS — M5481 Occipital neuralgia: Secondary | ICD-10-CM | POA: Diagnosis not present

## 2021-07-15 MED ORDER — HYDROCODONE-ACETAMINOPHEN 10-325 MG PO TABS: 1.0000 | ORAL_TABLET | Freq: Two times a day (BID) | ORAL | 0 refills | Status: AC | PRN

## 2021-07-15 MED ORDER — HYDROCODONE-ACETAMINOPHEN 10-325 MG PO TABS: 1.0000 | ORAL_TABLET | Freq: Two times a day (BID) | ORAL | 0 refills | Status: DC | PRN

## 2021-07-15 NOTE — Progress Notes (Signed)
PROVIDER NOTE: Information contained herein reflects review and annotations entered in association with encounter. Interpretation of such information and data should be left to medically-trained personnel. Information provided to patient can be located elsewhere in the medical record under "Patient Instructions". Document created using STT-dictation technology, any transcriptional errors that may result from process are unintentional.    Patient: Grant Young  Service Category: E/M  Provider: [name], MD  DOB: 1945-10-19  DOS: 07/15/2021  Specialty: Interventional Pain Management  MRN: [id number]  Setting: Ambulatory outpatient  PCP: [name], NP  Type: Established Patient    Referring Provider: [name], NP  Location: Office  Delivery: Face-to-face     HPI  [name], a 75 y.o. year old male, is here today because of his Chronic pain syndrome [G89.4]. [name] primary complain today is Back Pain Last encounter: My last encounter with him was on 06/17/21 Pertinent problems: 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 Pain Assessment: Severity of Chronic pain is reported as a 6 /10. Location: Back Lower/Denies. Onset: More than a month ago. Quality: Aching ("pain varies"). Timing: Constant. Modifying factor(s): ice packs. Vitals:  height is '5\' 9"'  (1.753 m) and weight is 147 lb (66.7 kg). His temporal temperature is 97.1 F (36.2 C) (abnormal). His blood pressure is 148/78 (abnormal) and his pulse is 63. His respiration is 18 and oxygen saturation is 100%.   Reason for encounter: medication management.     Patient experienced side effects with Nucynta trial that he started last month.  He experienced dizziness and orthostatic hypotension.  This improved after discontinuation of Nucynta.  Will transition  back to hydrocodone however will increase dose to 1 to 1.5 tablets twice daily as needed.  We will see how patient does with this.  Follow-up as scheduled for lumbar spinal injections later this month.   Pharmacotherapy Assessment  Analgesic: Transition back to hydrocodone, 10 to 15 mg twice daily as needed.  Monitoring: [place] PMP: PDMP reviewed during this encounter.       Pharmacotherapy: No side-effects or adverse reactions reported. Compliance: No problems identified. Effectiveness: Clinically acceptable.  UDS:  Summary  Date Value Ref Range Status  09/17/2020 Note  Final    Comment:    ==================================================================== Compliance Drug Analysis, Ur ==================================================================== Specimen Alert Note: Urinary creatinine is low; ability to detect some drugs may be compromised. Interpret results with caution. (Creatinine) ==================================================================== Test                             Result       Flag       Units  Drug Present and Declared for Prescription Verification   Lorazepam                      1107         EXPECTED   ng/mg creat    Source of lorazepam is a scheduled prescription medication.    Butalbital                     PRESENT      EXPECTED   Citalopram                     PRESENT      EXPECTED  Desmethylcitalopram            PRESENT      EXPECTED    Desmethylcitalopram is an expected metabolite of citalopram or the    enantiomeric form, escitalopram.    Acetaminophen                  PRESENT      EXPECTED   Propranolol                    PRESENT      EXPECTED  Drug Present not Declared for Prescription Verification   Oxazepam                       300          UNEXPECTED ng/mg creat   Temazepam                      357          UNEXPECTED ng/mg creat    Oxazepam and temazepam are expected metabolites of diazepam.    Oxazepam is also an expected  metabolite of other benzodiazepine    drugs, including chlordiazepoxide, prazepam, clorazepate, halazepam,    and temazepam.  Oxazepam and temazepam are available as scheduled    prescription medications.  Drug Absent but Declared for Prescription Verification   Hydrocodone                    Not Detected UNEXPECTED ng/mg creat   Tizanidine                     Not Detected UNEXPECTED    Tizanidine, as indicated in the declared medication list, is not    always detected even when used as directed.    Amitriptyline                  Not Detected UNEXPECTED   Prochlorperazine               Not Detected UNEXPECTED   Salicylate                     Not Detected UNEXPECTED    Aspirin, as indicated in the declared medication list, is not always    detected even when used as directed.  ==================================================================== Test                      Result    Flag   Units      Ref Range   Creatinine              14        LL     mg/dL      >=20 ==================================================================== Declared Medications:  The flagging and interpretation on this report are based on the  following declared medications.  Unexpected results may arise from  inaccuracies in the declared medications.   **Note: The testing scope of this panel includes these medications:   Amitriptyline (Elavil)  Butalbital (Fioricet)  Butalbital (Fiorinal)  Escitalopram (Lexapro)  Hydrocodone (Norco)  Lorazepam (Ativan)  Prochlorperazine (Compazine)  Propranolol (Inderal)   **Note: The testing scope of this panel does not include small to  moderate amounts of these reported medications:   Acetaminophen (Fioricet)  Acetaminophen (Norco)  Aspirin (Fiorinal)  Tizanidine (Zanaflex)   **Note: The testing scope of this panel does not include the  following reported medications:  Anastrozole (Arimidex)  Caffeine (Fioricet)  Caffeine (Fiorinal)  Chlorthalidone  (Hygroton)  Cholecalciferol  Cholestyramine (Questran)  Dicyclomine (Bentyl)  Ezetimibe (Zetia)  Hydrocortisone  Melatonin  Ondansetron (Zofran)  Potassium (Klor-Con)  Rabeprazole (Aciphex)  Sucralfate (Carafate)  Valacyclovir (Valtrex)  Vitamin B12 ==================================================================== For clinical consultation, please call [phone]. ====================================================================       ROS  Constitutional: Denies any fever or chills Gastrointestinal: No reported hemesis, hematochezia, vomiting, or acute GI distress Musculoskeletal:  mid, low  back pain Neurological: No reported episodes of acute onset apraxia, aphasia, dysarthria, agnosia, amnesia, paralysis, loss of coordination, or loss of consciousness  Medication Review  Cholecalciferol, Digestive Aids Mixture, Glutathione, HYDROcodone-acetaminophen, LORazepam, Loperamide HCl, NONFORMULARY OR COMPOUNDED ITEM, OVER THE COUNTER MEDICATION, Potassium, Probiotic Product, RABEprazole, Rhodiola rosea, Theanine, Vibegron, Vitamin B-12, amitriptyline, anastrozole, butalbital-acetaminophen-caffeine, chlorthalidone, cholestyramine, dicyclomine, escitalopram, ezetimibe, fludrocortisone, melatonin, ondansetron, prochlorperazine, sucralfate, testosterone cypionate, tizanidine, and valACYclovir  History Review  Allergy: Mr. Cilento is allergic to ace inhibitors, fluoxetine, metoclopramide, nalbuphine, other, amoxicillin-pot clavulanate, doxazosin, duloxetine, penicillins, tamsulosin, trazodone and nefazodone, amlodipine, cinoxacin, ciprofloxacin, nebivolol, olanzapine, olmesartan, pregabalin, prostaglandins, thyroid hormones, zolpidem, duloxetine hcl, fluoxetine hcl, nucynta [tapentadol], and phenytoin. Drug: Mr. Eckhardt  reports that he does not currently use drugs. Alcohol:  reports current alcohol use. Tobacco:  reports that he has never smoked. He has never used smokeless  tobacco. Social: Mr. Bischoff  reports that he has never smoked. He has never used smokeless tobacco. He reports current alcohol use. He reports that he does not currently use drugs. Medical:  has a past medical history of Anxiety, Chronic back pain, Chronic heart disease, Depression, GERD (gastroesophageal reflux disease), Headache, History of CT scan of brain (09/14/2018), History of depression, History of gastritis, History of headache, History of kidney stones, History of neuropathy, Hypertension, Insomnia, Kidney stone, Lumbar radicular pain, Lyme disease, Myocardial infarction ([place]), Overactive bladder, PONV (postoperative nausea and vomiting), Skin cancer, basal cell, Sleep trouble, Spinal cord stimulator status, Squamous cell skin cancer, and Substance abuse ([place]). Surgical: Mr. Decesare  has a past surgical history that includes Kidney stone surgery (09/14/1980); Kidney stone surgery (09/15/1995); Lithotripsy (09/14/2014); Lithotripsy (09/14/1996); Lithotripsy (09/14/1997); Gallbladder surgery (09/15/2015); Sigmoidoscopy (09/14/2017); Colonoscopy (09/15/2015); Cholecystectomy (2017); Shoulder surgery (Right, 1984); Back surgery; Spinal cord stimulator implant; Pain pump implantation; Pain pump removal; Tonsillectomy; Foot surgery (Bilateral, 03/18/2020); Foot surgery (08/032021); Upper gi endoscopy; Cardiac catheterization; Colonoscopy with propofol (N/A, 10/03/2020); Esophagogastroduodenoscopy (egd) with propofol (N/A, 10/03/2020); Cataract extraction w/PHACO (Left, 01/14/2021); and Cataract extraction w/PHACO (Right, 01/28/2021). Family: family history includes Anxiety disorder in his daughter; Dementia in his mother; Heart disease in his father; Heart failure in his father; Hypertension in his father; Kidney Stones in his daughter; OCD in his daughter; Stroke in his father and mother.  Laboratory Chemistry Profile   Renal Lab Results  Component Value Date   BUN 1 (A) 05/20/2020   CREATININE 0.90  03/06/2020   GFRAA 101 05/20/2020   GFRNONAA 87 05/20/2020     Hepatic Lab Results  Component Value Date   AST 18 05/20/2020   ALT 24 05/20/2020   ALBUMIN 4.2 05/20/2020   ALKPHOS 149 (H) 09/16/2020     Electrolytes Lab Results  Component Value Date   NA 145 05/20/2020   K 3.2 (A) 05/20/2020   CL 102 05/20/2020   CALCIUM 9.8 05/20/2020     Bone Lab Results  Component Value Date   TESTOSTERONE 33.2 05/20/2020     Inflammation (CRP: Acute Phase) (ESR: Chronic Phase) No results found for: CRP,  ESRSEDRATE, LATICACIDVEN     Note: Above Lab results reviewed.  Physical Exam  General appearance: Well nourished, well developed, and well hydrated. In no apparent acute distress Mental status: Alert, oriented x 3 (person, place, & time)       Respiratory: No evidence of acute respiratory distress Eyes: PERLA Vitals: BP (!) 148/78   Pulse 63   Temp (!) 97.1 F (36.2 C) (Temporal)   Resp 18   Ht '5\' 9"'  (1.753 m)   Wt 147 lb (66.7 kg)   SpO2 100%   BMI 21.71 kg/m  BMI: Estimated body mass index is 21.71 kg/m as calculated from the following:   Height as of this encounter: '5\' 9"'  (1.753 m).   Weight as of this encounter: 147 lb (66.7 kg). Ideal: Ideal body weight: 70.7 kg (155 lb 13.8 oz)  Lumbar Spine Area Exam  Skin & Axial Inspection: Well healed scar from previous spine surgery detected IPG present from SCS Alignment: Scoliosis detected Functional ROM: Pain restricted ROM       Stability: No instability detected Muscle Tone/Strength: Functionally intact. No obvious neuro-muscular anomalies detected. Sensory (Neurological): Facet agenic pain pattern, pain with facet loading Gait & Posture Assessment  Ambulation: Unassisted Gait: Relatively normal for age and body habitus Posture: WNL   Lower Extremity Exam    Side: Right lower extremity  Side: Left lower extremity  Stability: No instability observed          Stability: No instability observed          Skin &  Extremity Inspection: Skin color, temperature, and hair growth are WNL. No peripheral edema or cyanosis. No masses, redness, swelling, asymmetry, or associated skin lesions. No contractures.  Skin & Extremity Inspection: Skin color, temperature, and hair growth are WNL. No peripheral edema or cyanosis. No masses, redness, swelling, asymmetry, or associated skin lesions. No contractures.  Functional ROM: Unrestricted ROM                  Functional ROM: Unrestricted ROM                  Muscle Tone/Strength: Functionally intact. No obvious neuro-muscular anomalies detected.  Muscle Tone/Strength: Functionally intact. No obvious neuro-muscular anomalies detected.  Sensory (Neurological): Unimpaired        Sensory (Neurological): Unimpaired        DTR: Patellar: deferred today Achilles: deferred today Plantar: deferred today  DTR: Patellar: deferred today Achilles: deferred today Plantar: deferred today  Palpation: No palpable anomalies  Palpation: No palpable anomalies    Assessment   Status Diagnosis  Controlled Having a Flare-up Having a Flare-up 1. Chronic pain syndrome   2. Lumbar facet arthropathy   3. Lumbar spondylosis   4. Bilateral occipital neuralgia   5. 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 DRAYCEN LEICHTER has a current medication list which includes the following long-term medication(s): amitriptyline, chlorthalidone, dicyclomine, escitalopram, ezetimibe, potassium, rabeprazole, sucralfate, testosterone cypionate, and cholestyramine.  Pharmacotherapy (Medications Ordered): Meds ordered this encounter  Medications   HYDROcodone-acetaminophen (NORCO) 10-325 MG tablet    Sig: Take 1-1.5 tablets by mouth 2 (two) times daily as needed.    Dispense:  90 tablet    Refill:  0   HYDROcodone-acetaminophen (NORCO) 10-325 MG tablet    Sig: Take 1-1.5 tablets by mouth 2 (two) times daily as needed.    Dispense:  90 tablet     Refill:  0   HYDROcodone-acetaminophen (NORCO) 10-325 MG tablet    Sig: Take 1-1.5 tablets by mouth 2 (two) times daily as needed.    Dispense:  90 tablet    Refill:  0    Follow-up plan:   Return in about 3 months (around 10/15/2021) for Medication Management, in person.     Status post Botox No. 1 on 01/01/2020 for migraine, lumbar TPI as well. SPG block 03/13/20. Bilateral GONB 04/01/20. 11/13/20 B/L L3,4,5 Facet blocks helpful repeat prn          Recent Visits Date Type Provider Dept  06/17/21 Office Visit [name], MD [facility]  Showing recent visits within past 90 days and meeting all other requirements Today's Visits Date Type Provider Dept  07/15/21 Office Visit [name], MD [facility]  Showing today's visits and meeting all other requirements Future Appointments Date Type Provider Dept  08/04/21 Appointment [name], MD [facility]  10/09/21 Appointment [name], MD [facility]  Showing future appointments within next 90 days and meeting all other requirements I discussed the assessment and treatment plan with the patient. The patient was provided an opportunity to ask questions and all were answered. The patient agreed with the plan and demonstrated an understanding of the instructions.  Patient advised to call back or seek an in-person evaluation if the symptoms or condition worsens.  Duration of encounter: 53mnutes.  Note by: BGillis Santa MD Date: 07/15/2021; Time: 11:39 AM

## 2021-07-15 NOTE — Progress Notes (Signed)
Safety precautions to be maintained throughout the outpatient stay will include: orient to surroundings, keep bed in low position, maintain call bell within reach at all times, provide assistance with transfer out of bed and ambulation.   Nursing Pain Medication Assessment:  Safety precautions to be maintained throughout the outpatient stay will include: orient to surroundings, keep bed in low position, maintain call bell within reach at all times, provide assistance with transfer out of bed and ambulation.  Medication Inspection Compliance: Pill count conducted under aseptic conditions, in front of the patient. Neither the pills nor the bottle was removed from the patient's sight at any time. Once count was completed pills were immediately returned to the patient in their original bottle.  Medication: Tapentadol (Nucynta) Pill/Patch Count:  83 of 90 pills remain Pill/Patch Appearance: Markings consistent with prescribed medication Bottle Appearance: Standard pharmacy container. Clearly labeled. Filled Date: 37 / 04 / 2022 Last Medication intake:   Last medication intake 4 weeks ago - caused vertigo.     Tapentadol (Nucynta) 83 pills discarded today per patient request and witnessed by patient and his wife.   Al Decant, RN

## 2021-07-15 NOTE — Progress Notes (Signed)
PROVIDER NOTE: Information contained herein reflects review and annotations entered in association with encounter. Interpretation of such information and data should be left to medically-trained personnel. Information provided to patient can be located elsewhere in the medical record under "Patient Instructions". Document created using STT-dictation technology, any transcriptional errors that may result from process are unintentional.    Patient: Grant Young  Service Category: E/M  Provider: [name], MD  DOB: February 29, 1946  DOS: 07/15/2021  Specialty: Interventional Pain Management  MRN: [id number]  Setting: Ambulatory outpatient  PCP: [name], NP  Type: Established Patient    Referring Provider: [name], NP  Location: Office  Delivery: Face-to-face     HPI  Grant Young, a 75 y.o. year old male, is here today because of his No primary diagnosis found.. Mr. Staples primary complain today is Back Pain Last encounter: My last encounter with him was on 04/10/21 Pertinent problems: Mr. 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 Pain Assessment: Severity of Chronic pain is reported as a 6 /10. Location: Back Lower/Denies. Onset: More than a month ago. Quality: Aching ("pain varies"). Timing: Constant. Modifying factor(s): ice packs. Vitals:  height is '5\' 9"'  (1.753 m) and weight is 147 lb (66.7 kg). His temporal temperature is 97.1 F (36.2 C) (abnormal). His blood pressure is 148/78 (abnormal) and his pulse is 63. His respiration is 18 and oxygen saturation is 100%.   Reason for encounter: medication management.     Patient presents today complaining of increased lumbar spine pain related to lumbar facet arthropathy.  He is status post bilateral L3, L4, L5 lumbar facet medial branch nerve block on 11/13/2020 provided  him with 80 to 90% pain relief in regards to his low back pain for approximately 6 months.  He would like to repeat these.  Is also endorsing increased neuropathic pain of bilateral feet described as burning, tingling, irritating sensation.  We discussed Qutenza treatment.  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  We discussed transition to [place] for chronic pain management as it has SNRI potential which could be helpful for neuropathic pain.  If this is not effective, can transition back to hydrocodone.   Pharmacotherapy Assessment  Analgesic:Nucynta 50 mg every 8 hours as needed  Monitoring: [place] PMP: PDMP not reviewed this encounter.       Pharmacotherapy: No side-effects or adverse reactions reported. Compliance: No problems identified. Effectiveness: Clinically acceptable.  UDS:  Summary  Date Value Ref Range Status  09/17/2020 Note  Final    Comment:    ==================================================================== Compliance Drug Analysis, Ur ==================================================================== Specimen Alert Note: Urinary creatinine is low; ability to detect some drugs may be compromised. Interpret results with caution. (Creatinine) ==================================================================== Test                             Result       Flag       Units  Drug Present and Declared for Prescription Verification   Lorazepam                      1107         EXPECTED   ng/mg creat    Source  of lorazepam is a scheduled prescription medication.    Butalbital                     PRESENT      EXPECTED   Citalopram                     PRESENT      EXPECTED   Desmethylcitalopram            PRESENT      EXPECTED    Desmethylcitalopram is an expected metabolite of citalopram or the    enantiomeric form, escitalopram.     Acetaminophen                  PRESENT      EXPECTED   Propranolol                    PRESENT      EXPECTED  Drug Present not Declared for Prescription Verification   Oxazepam                       300          UNEXPECTED ng/mg creat   Temazepam                      357          UNEXPECTED ng/mg creat    Oxazepam and temazepam are expected metabolites of diazepam.    Oxazepam is also an expected metabolite of other benzodiazepine    drugs, including chlordiazepoxide, prazepam, clorazepate, halazepam,    and temazepam.  Oxazepam and temazepam are available as scheduled    prescription medications.  Drug Absent but Declared for Prescription Verification   Hydrocodone                    Not Detected UNEXPECTED ng/mg creat   Tizanidine                     Not Detected UNEXPECTED    Tizanidine, as indicated in the declared medication list, is not    always detected even when used as directed.    Amitriptyline                  Not Detected UNEXPECTED   Prochlorperazine               Not Detected UNEXPECTED   Salicylate                     Not Detected UNEXPECTED    Aspirin, as indicated in the declared medication list, is not always    detected even when used as directed.  ==================================================================== Test                      Result    Flag   Units      Ref Range   Creatinine              14        LL     mg/dL      >=20 ==================================================================== Declared Medications:  The flagging and interpretation on this report are based on the  following declared medications.  Unexpected results may arise from  inaccuracies in the declared medications.   **Note: The testing scope of this panel includes these medications:   Amitriptyline (Elavil)  Butalbital (Fioricet)  Butalbital (Fiorinal)  Escitalopram (Lexapro)  Hydrocodone (Norco)  Lorazepam (Ativan)  Prochlorperazine (Compazine)  Propranolol (Inderal)    **Note: The testing scope of this panel does not include small to  moderate amounts of these reported medications:   Acetaminophen (Fioricet)  Acetaminophen (Norco)  Aspirin (Fiorinal)  Tizanidine (Zanaflex)   **Note: The testing scope of this panel does not include the  following reported medications:   Anastrozole (Arimidex)  Caffeine (Fioricet)  Caffeine (Fiorinal)  Chlorthalidone (Hygroton)  Cholecalciferol  Cholestyramine (Questran)  Dicyclomine (Bentyl)  Ezetimibe (Zetia)  Hydrocortisone  Melatonin  Ondansetron (Zofran)  Potassium (Klor-Con)  Rabeprazole (Aciphex)  Sucralfate (Carafate)  Valacyclovir (Valtrex)  Vitamin B12 ==================================================================== For clinical consultation, please call [phone]. ====================================================================       ROS  Constitutional: Denies any fever or chills Gastrointestinal: No reported hemesis, hematochezia, vomiting, or acute GI distress Musculoskeletal:  mid, low  back pain Neurological: No reported episodes of acute onset apraxia, aphasia, dysarthria, agnosia, amnesia, paralysis, loss of coordination, or loss of consciousness  Medication Review  Cholecalciferol, Digestive Aids Mixture, Glutathione, HYDROcodone-acetaminophen, LORazepam, Loperamide HCl, NONFORMULARY OR COMPOUNDED ITEM, OVER THE COUNTER MEDICATION, Potassium, Probiotic Product, RABEprazole, Rhodiola rosea, Theanine, Vibegron, Vitamin B-12, amitriptyline, anastrozole, butalbital-acetaminophen-caffeine, chlorthalidone, cholestyramine, dicyclomine, escitalopram, ezetimibe, fludrocortisone, melatonin, ondansetron, prochlorperazine, sucralfate, testosterone cypionate, tizanidine, and valACYclovir  History Review  Allergy: Mr. Garber is allergic to ace inhibitors, fluoxetine, metoclopramide, nalbuphine, other, amoxicillin-pot clavulanate, doxazosin, duloxetine, penicillins, tamsulosin, trazodone and  nefazodone, amlodipine, cinoxacin, ciprofloxacin, nebivolol, olanzapine, olmesartan, pregabalin, prostaglandins, thyroid hormones, zolpidem, duloxetine hcl, fluoxetine hcl, nucynta [tapentadol], and phenytoin. Drug: Mr. Ostermiller  reports that he does not currently use drugs. Alcohol:  reports current alcohol use. Tobacco:  reports that he has never smoked. He has never used smokeless tobacco. Social: Mr. Wojtkiewicz  reports that he has never smoked. He has never used smokeless tobacco. He reports current alcohol use. He reports that he does not currently use drugs. Medical:  has a past medical history of Anxiety, Chronic back pain, Chronic heart disease, Depression, GERD (gastroesophageal reflux disease), Headache, History of CT scan of brain (09/14/2018), History of depression, History of gastritis, History of headache, History of kidney stones, History of neuropathy, Hypertension, Insomnia, Kidney stone, Lumbar radicular pain, Lyme disease, Myocardial infarction ([place]), Overactive bladder, PONV (postoperative nausea and vomiting), Skin cancer, basal cell, Sleep trouble, Spinal cord stimulator status, Squamous cell skin cancer, and Substance abuse ([place]). Surgical: Mr. Geisen  has a past surgical history that includes Kidney stone surgery (09/14/1980); Kidney stone surgery (09/15/1995); Lithotripsy (09/14/2014); Lithotripsy (09/14/1996); Lithotripsy (09/14/1997); Gallbladder surgery (09/15/2015); Sigmoidoscopy (09/14/2017); Colonoscopy (09/15/2015); Cholecystectomy (2017); Shoulder surgery (Right, 1984); Back surgery; Spinal cord stimulator implant; Pain pump implantation; Pain pump removal; Tonsillectomy; Foot surgery (Bilateral, 03/18/2020); Foot surgery (08/032021); Upper gi endoscopy; Cardiac catheterization; Colonoscopy with propofol (N/A, 10/03/2020); Esophagogastroduodenoscopy (egd) with propofol (N/A, 10/03/2020); Cataract extraction w/PHACO (Left, 01/14/2021); and Cataract extraction w/PHACO (Right,  01/28/2021). Family: family history includes Anxiety disorder in his daughter; Dementia in his mother; Heart disease in his father; Heart failure in his father; Hypertension in his father; Kidney Stones in his daughter; OCD in his daughter; Stroke in his father and mother.  Laboratory Chemistry Profile   Renal Lab Results  Component Value Date   BUN 1 (A) 05/20/2020   CREATININE 0.90 03/06/2020   GFRAA 101 05/20/2020   GFRNONAA 87 05/20/2020     Hepatic Lab Results  Component Value Date   AST 18 05/20/2020   ALT 24 05/20/2020   ALBUMIN 4.2 05/20/2020  ALKPHOS 149 (H) 09/16/2020     Electrolytes Lab Results  Component Value Date   NA 145 05/20/2020   K 3.2 (A) 05/20/2020   CL 102 05/20/2020   CALCIUM 9.8 05/20/2020     Bone Lab Results  Component Value Date   TESTOSTERONE 33.2 05/20/2020     Inflammation (CRP: Acute Phase) (ESR: Chronic Phase) No results found for: CRP, ESRSEDRATE, LATICACIDVEN     Note: Above Lab results reviewed.  Physical Exam  General appearance: Well nourished, well developed, and well hydrated. In no apparent acute distress Mental status: Alert, oriented x 3 (person, place, & time)       Respiratory: No evidence of acute respiratory distress Eyes: PERLA Vitals: BP (!) 148/78   Pulse 63   Temp (!) 97.1 F (36.2 C) (Temporal)   Resp 18   Ht '5\' 9"'  (1.753 m)   Wt 147 lb (66.7 kg)   SpO2 100%   BMI 21.71 kg/m  BMI: Estimated body mass index is 21.71 kg/m as calculated from the following:   Height as of this encounter: '5\' 9"'  (1.753 m).   Weight as of this encounter: 147 lb (66.7 kg). Ideal: Ideal body weight: 70.7 kg (155 lb 13.8 oz)  Lumbar Spine Area Exam  Skin & Axial Inspection: Well healed scar from previous spine surgery detected IPG present from SCS Alignment: Scoliosis detected Functional ROM: Pain restricted ROM       Stability: No instability detected Muscle Tone/Strength: Functionally intact. No obvious neuro-muscular  anomalies detected. Sensory (Neurological): Facet agenic pain pattern, pain with facet loading Gait & Posture Assessment  Ambulation: Unassisted Gait: Relatively normal for age and body habitus Posture: WNL   Lower Extremity Exam    Side: Right lower extremity  Side: Left lower extremity  Stability: No instability observed          Stability: No instability observed          Skin & Extremity Inspection: Skin color, temperature, and hair growth are WNL. No peripheral edema or cyanosis. No masses, redness, swelling, asymmetry, or associated skin lesions. No contractures.  Skin & Extremity Inspection: Skin color, temperature, and hair growth are WNL. No peripheral edema or cyanosis. No masses, redness, swelling, asymmetry, or associated skin lesions. No contractures.  Functional ROM: Unrestricted ROM                  Functional ROM: Unrestricted ROM                  Muscle Tone/Strength: Functionally intact. No obvious neuro-muscular anomalies detected.  Muscle Tone/Strength: Functionally intact. No obvious neuro-muscular anomalies detected.  Sensory (Neurological): Unimpaired        Sensory (Neurological): Unimpaired        DTR: Patellar: deferred today Achilles: deferred today Plantar: deferred today  DTR: Patellar: deferred today Achilles: deferred today Plantar: deferred today  Palpation: No palpable anomalies  Palpation: No palpable anomalies    Assessment   Status Diagnosis  Controlled Having a Flare-up Having a Flare-up No diagnosis found.    Plan of Care  Mr. ALEXANDRU MOORER has a current medication list which includes the following long-term medication(s): amitriptyline, chlorthalidone, dicyclomine, escitalopram, ezetimibe, potassium, rabeprazole, sucralfate, testosterone cypionate, and cholestyramine.  Pharmacotherapy (Medications Ordered): No orders of the defined types were placed in this encounter.  Orders:  No orders of the defined types were placed in this  encounter.  Follow-up plan:   No follow-ups on file.  Status post Botox No. 1 on 01/01/2020 for migraine, lumbar TPI as well. SPG block 03/13/20. Bilateral GONB 04/01/20. 11/13/20 B/L L3,4,5 Facet blocks helpful repeat prn          Recent Visits Date Type Provider Dept  06/17/21 Office Visit [name], MD [facility]  Showing recent visits within past 90 days and meeting all other requirements Today's Visits Date Type Provider Dept  07/15/21 Office Visit [name], MD [facility]  Showing today's visits and meeting all other requirements Future Appointments Date Type Provider Dept  08/04/21 Appointment [name], MD [facility]  Showing future appointments within next 90 days and meeting all other requirements I discussed the assessment and treatment plan with the patient. The patient was provided an opportunity to ask questions and all were answered. The patient agreed with the plan and demonstrated an understanding of the instructions.  Patient advised to call back or seek an in-person evaluation if the symptoms or condition worsens.  Duration of encounter: 52mnutes.  Note by: BGillis Santa MD Date: 07/15/2021; Time: 11:15 AM

## 2021-07-18 ENCOUNTER — Other Ambulatory Visit: Payer: Self-pay | Admitting: Internal Medicine

## 2021-07-18 DIAGNOSIS — I10 Essential (primary) hypertension: Secondary | ICD-10-CM

## 2021-07-18 NOTE — Telephone Encounter (Signed)
Requested Prescriptions  Pending Prescriptions Disp Refills  . chlorthalidone (HYGROTON) 25 MG tablet [Pharmacy Med Name: CHLORTHALIDONE 25 MG TABLET] 90 tablet 0    Sig: TAKE 1 TABLET (25 MG TOTAL) BY MOUTH DAILY.     There is no refill protocol information for this order

## 2021-07-19 ENCOUNTER — Other Ambulatory Visit: Payer: Self-pay | Admitting: Internal Medicine

## 2021-07-19 NOTE — Telephone Encounter (Signed)
Rabeprazole :last RF 04/03/21 #180 1 RF

## 2021-07-28 MED ORDER — FLUDROCORTISONE ACETATE 0.1 MG PO TABS: 0.1000 mg | ORAL_TABLET | Freq: Every day | ORAL | 0 refills | Status: DC

## 2021-07-28 MED ORDER — LORAZEPAM 1 MG PO TABS: 1.0000 mg | ORAL_TABLET | Freq: Two times a day (BID) | ORAL | 0 refills | Status: DC

## 2021-07-29 DIAGNOSIS — M47816 Spondylosis without myelopathy or radiculopathy, lumbar region: Secondary | ICD-10-CM | POA: Diagnosis not present

## 2021-08-03 ENCOUNTER — Encounter: Payer: Self-pay | Admitting: Internal Medicine

## 2021-08-04 ENCOUNTER — Ambulatory Visit
Admission: RE | Admit: 2021-08-04 | Discharge: 2021-08-04 | Disposition: A | Discharge status: Home / Self Care | Payer: Worker's Compensation | Source: Ambulatory Visit | Attending: Student in an Organized Health Care Education/Training Program | Admitting: Student in an Organized Health Care Education/Training Program

## 2021-08-04 ENCOUNTER — Other Ambulatory Visit: Payer: Self-pay

## 2021-08-04 ENCOUNTER — Ambulatory Visit (HOSPITAL_BASED_OUTPATIENT_CLINIC_OR_DEPARTMENT_OTHER): Payer: Worker's Compensation | Admitting: Student in an Organized Health Care Education/Training Program

## 2021-08-04 ENCOUNTER — Encounter: Payer: Self-pay | Admitting: Student in an Organized Health Care Education/Training Program

## 2021-08-04 DIAGNOSIS — M47816 Spondylosis without myelopathy or radiculopathy, lumbar region: Secondary | ICD-10-CM | POA: Insufficient documentation

## 2021-08-04 MED ORDER — ROPIVACAINE HCL 2 MG/ML IJ SOLN
9.0000 mL | Freq: Once | INTRAMUSCULAR | Status: AC
  Administered 2021-08-04: 9 mL via PERINEURAL

## 2021-08-04 MED ORDER — ROPIVACAINE HCL 2 MG/ML IJ SOLN
INTRAMUSCULAR | Status: AC
  Filled 2021-08-04: qty 20

## 2021-08-04 MED ORDER — LIDOCAINE HCL 2 % IJ SOLN
20.0000 mL | Freq: Once | INTRAMUSCULAR | Status: AC
  Administered 2021-08-04: 200 mg

## 2021-08-04 MED ORDER — LIDOCAINE HCL 2 % IJ SOLN
INTRAMUSCULAR | Status: AC
  Filled 2021-08-04: qty 20

## 2021-08-04 MED ORDER — DEXAMETHASONE SODIUM PHOSPHATE 10 MG/ML IJ SOLN
10.0000 mg | Freq: Once | INTRAMUSCULAR | Status: AC
  Administered 2021-08-04: 10 mg
  Filled 2021-08-04: qty 1

## 2021-08-04 NOTE — Progress Notes (Signed)
PROVIDER NOTE: Information contained herein reflects review and annotations entered in association with encounter. Interpretation of such information and data should be left to medically-trained personnel. Information provided to patient can be located elsewhere in the medical record under "Patient Instructions". Document created using STT-dictation technology, any transcriptional errors that may result from process are unintentional.    Patient: Grant Young  Service Category: Procedure  Provider: [name], MD  DOB: 10-21-1945  DOS: 08/04/2021  Location: [facility]  MRN: [id number]  Setting: Ambulatory - outpatient  Referring Provider: [name], MD  Type: Established Patient  Specialty: Interventional Pain Management  PCP: [name], NP   Primary Reason for Visit: Interventional Pain Management Treatment. CC: Back Pain (lower)  Procedure:          Anesthesia, Analgesia, Anxiolysis:  Type: Lumbar Facet, Medial Branch Block(s) #2 (for 2022, had 2 done in 2021)  Primary Purpose: Therapeutic Region: Posterolateral Lumbosacral Spine Level: L3, L4, L5, Medial Branch Level(s). Injecting these levels blocks the L3-4, L4-5,  lumbar facet joints. Laterality: Bilateral  Type: Local Anesthesia  Local Anesthetic: Lidocaine 1-2%  Position: Prone   Indications: 1. Lumbar facet arthropathy   2. Lumbar spondylosis    Pain Score: Pre-procedure: 7 /10 Post-procedure: 2 /10   Pre-op Assessment:  Mr. Wender is a 75 y.o. (year old), male patient, seen today for interventional treatment. He  has a past surgical history that includes Kidney stone surgery (09/14/1980); Kidney stone surgery (09/15/1995); Lithotripsy (09/14/2014); Lithotripsy (09/14/1996); Lithotripsy (09/14/1997); Gallbladder surgery (09/15/2015); Sigmoidoscopy (09/14/2017); Colonoscopy (09/15/2015); Cholecystectomy (2017); Shoulder surgery (Right, 1984); Back surgery; Spinal cord stimulator implant; Pain pump  implantation; Pain pump removal; Tonsillectomy; Foot surgery (Bilateral, 03/18/2020); Foot surgery (08/032021); Upper gi endoscopy; Cardiac catheterization; Colonoscopy with propofol (N/A, 10/03/2020); Esophagogastroduodenoscopy (egd) with propofol (N/A, 10/03/2020); Cataract extraction w/PHACO (Left, 01/14/2021); and Cataract extraction w/PHACO (Right, 01/28/2021). Mr. Hutchinson has a current medication list which includes the following prescription(s): amitriptyline, anastrozole, butalbital-acetaminophen-caffeine, chlorthalidone, cholecalciferol, cholestyramine, vitamin b-12, dicyclomine, digestive aids mixture, escitalopram, ezetimibe, fludrocortisone, glutathione, hydrocodone-acetaminophen, [START ON 08/14/2021] hydrocodone-acetaminophen, [START ON 09/13/2021] hydrocodone-acetaminophen, loperamide hcl, lorazepam, melatonin, NONFORMULARY OR COMPOUNDED ITEM, ondansetron, OVER THE COUNTER MEDICATION, potassium, probiotic product, prochlorperazine, rabeprazole, sucralfate, testosterone cypionate, theanine, tizanidine, valacyclovir, gemtesa, and rhodiola rosea. His primarily concern today is the Back Pain (lower)  Initial Vital Signs:  Pulse/HCG Rate: 63ECG Heart Rate: (!) 58 Temp: (!) 97.1 F (36.2 C) Resp: 16 BP: 133/75 SpO2: 98 %  BMI: Estimated body mass index is 21.41 kg/m as calculated from the following:   Height as of this encounter: 5\' 9"  (1.753 m).   Weight as of this encounter: 145 lb (65.8 kg).  Risk Assessment: Allergies: Reviewed. He is allergic to ace inhibitors, fluoxetine, metoclopramide, nalbuphine, other, amoxicillin-pot clavulanate, doxazosin, duloxetine, penicillins, tamsulosin, trazodone and nefazodone, amlodipine, cinoxacin, ciprofloxacin, fludrocortisone, nebivolol, olanzapine, olmesartan, pregabalin, prostaglandins, thyroid hormones, zolpidem, duloxetine hcl, fluoxetine hcl, nucynta [tapentadol], and phenytoin.  Allergy Precautions: None required Coagulopathies: Reviewed. None  identified.  Blood-thinner therapy: None at this time Active Infection(s): Reviewed. None identified. Mr. Magowan is afebrile  Site Confirmation: Mr. Dia was asked to confirm the procedure and laterality before marking the site Procedure checklist: Completed Consent: Before the procedure and under the influence of no sedative(s), amnesic(s), or anxiolytics, the patient was informed of the treatment options, risks and possible complications. To fulfill our ethical and legal obligations, as recommended by the American Medical Association's Code of Ethics, I have informed the patient of my clinical impression;  the nature and purpose of the treatment or procedure; the risks, benefits, and possible complications of the intervention; the alternatives, including doing nothing; the risk(s) and benefit(s) of the alternative treatment(s) or procedure(s); and the risk(s) and benefit(s) of doing nothing. The patient was provided information about the general risks and possible complications associated with the procedure. These may include, but are not limited to: failure to achieve desired goals, infection, bleeding, organ or nerve damage, allergic reactions, paralysis, and death. In addition, the patient was informed of those risks and complications associated to Spine-related procedures, such as failure to decrease pain; infection (i.e.: Meningitis, epidural or intraspinal abscess); bleeding (i.e.: epidural hematoma, subarachnoid hemorrhage, or any other type of intraspinal or peri-dural bleeding); organ or nerve damage (i.e.: Any type of peripheral nerve, nerve root, or spinal cord injury) with subsequent damage to sensory, motor, and/or autonomic systems, resulting in permanent pain, numbness, and/or weakness of one or several areas of the body; allergic reactions; (i.e.: anaphylactic reaction); and/or death. Furthermore, the patient was informed of those risks and complications associated with the medications. These  include, but are not limited to: allergic reactions (i.e.: anaphylactic or anaphylactoid reaction(s)); adrenal axis suppression; blood sugar elevation that in diabetics may result in ketoacidosis or comma; water retention that in patients with history of congestive heart failure may result in shortness of breath, pulmonary edema, and decompensation with resultant heart failure; weight gain; swelling or edema; medication-induced neural toxicity; particulate matter embolism and blood vessel occlusion with resultant organ, and/or nervous system infarction; and/or aseptic necrosis of one or more joints. Finally, the patient was informed that Medicine is not an exact science; therefore, there is also the possibility of unforeseen or unpredictable risks and/or possible complications that may result in a catastrophic outcome. The patient indicated having understood very clearly. We have given the patient no guarantees and we have made no promises. Enough time was given to the patient to ask questions, all of which were answered to the patient's satisfaction. Mr. Foti has indicated that he wanted to continue with the procedure. Attestation: I, the ordering provider, attest that I have discussed with the patient the benefits, risks, side-effects, alternatives, likelihood of achieving goals, and potential problems during recovery for the procedure that I have provided informed consent. Date  Time: 08/04/2021 10:29 AM  Pre-Procedure Preparation:  Monitoring: As per clinic protocol. Respiration, ETCO2, SpO2, BP, heart rate and rhythm monitor placed and checked for adequate function Safety Precautions: Patient was assessed for positional comfort and pressure points before starting the procedure. Time-out: I initiated and conducted the "Time-out" before starting the procedure, as per protocol. The patient was asked to participate by confirming the accuracy of the "Time Out" information. Verification of the correct  person, site, and procedure were performed and confirmed by me, the nursing staff, and the patient. "Time-out" conducted as per Joint Commission's Universal Protocol (UP.01.01.01). Time: 1113  Description of Procedure:          Laterality: Bilateral. The procedure was performed in identical fashion on both sides. Levels:  L3, L4, L5,  Medial Branch Level(s) Area Prepped: Posterior Lumbosacral Region DuraPrep (Iodine Povacrylex [0.7% available iodine] and Isopropyl Alcohol, 74% w/w) Safety Precautions: Aspiration looking for blood return was conducted prior to all injections. At no point did we inject any substances, as a needle was being advanced. Before injecting, the patient was told to immediately notify me if he was experiencing any new onset of "ringing in the ears, or metallic taste in the  mouth". No attempts were made at seeking any paresthesias. Safe injection practices and needle disposal techniques used. Medications properly checked for expiration dates. SDV (single dose vial) medications used. After the completion of the procedure, all disposable equipment used was discarded in the proper designated medical waste containers. Local Anesthesia: Protocol guidelines were followed. The patient was positioned over the fluoroscopy table. The area was prepped in the usual manner. The time-out was completed. The target area was identified using fluoroscopy. A 12-in long, straight, sterile hemostat was used with fluoroscopic guidance to locate the targets for each level blocked. Once located, the skin was marked with an approved surgical skin marker. Once all sites were marked, the skin (epidermis, dermis, and hypodermis), as well as deeper tissues (fat, connective tissue and muscle) were infiltrated with a small amount of a short-acting local anesthetic, loaded on a 10cc syringe with a 25G, 1.5-in  Needle. An appropriate amount of time was allowed for local anesthetics to take effect before proceeding to  the next step. Local Anesthetic: Lidocaine 2.0% The unused portion of the local anesthetic was discarded in the proper designated containers. Technical explanation of process:   L3 Medial Branch Nerve Block (MBB): The target area for the L3 medial branch is at the junction of the postero-lateral aspect of the superior articular process and the superior, posterior, and medial edge of the transverse process of L4. Under fluoroscopic guidance, a Quincke needle was inserted until contact was made with os over the superior postero-lateral aspect of the pedicular shadow (target area). After negative aspiration for blood, 1.5 mL of the nerve block solution was injected without difficulty or complication. The needle was removed intact. L4 Medial Branch Nerve Block (MBB): The target area for the L4 medial branch is at the junction of the postero-lateral aspect of the superior articular process and the superior, posterior, and medial edge of the transverse process of L5. Under fluoroscopic guidance, a Quincke needle was inserted until contact was made with os over the superior postero-lateral aspect of the pedicular shadow (target area). After negative aspiration for blood, 1.5 mL of the nerve block solution was injected without difficulty or complication. The needle was removed intact. L5 Medial Branch Nerve Block (MBB): The target area for the L5 medial branch is at the junction of the postero-lateral aspect of the superior articular process and the superior, posterior, and medial edge of the sacral ala. Under fluoroscopic guidance, a Quincke needle was inserted until contact was made with os over the superior postero-lateral aspect of the pedicular shadow (target area). After negative aspiration for blood, 1.37mL of the nerve block solution was injected without difficulty or complication. The needle was removed intact. Nerve block solution: 10 cc solution made of 8 cc of 0.2% ropivacaine, 2 cc of Decadron 10 mg/cc.   1.5 cc injected at each level above bilaterally.  The unused portion of the solution was discarded in the proper designated containers. Procedural Needles: 22-gauge, 3.5-inch, Quincke needles used for all levels.  Once the entire procedure was completed, the treated area was cleaned, making sure to leave some of the prepping solution back to take advantage of its long term bactericidal properties.   Illustration of the posterior view of the lumbar spine and the posterior neural structures. Laminae of L2 through S1 are labeled. DPRL5, dorsal primary ramus of L5; DPRS1, dorsal primary ramus of S1; DPR3, dorsal primary ramus of L3; FJ, facet (zygapophyseal) joint L3-L4; I, inferior articular process of L4; LB1, lateral branch of  dorsal primary ramus of L1; IAB, inferior articular branches from L3 medial branch (supplies L4-L5 facet joint); IBP, intermediate branch plexus; MB3, medial branch of dorsal primary ramus of L3; NR3, third lumbar nerve root; S, superior articular process of L5; SAB, superior articular branches from L4 (supplies L4-5 facet joint also); TP3, transverse process of L3.  Vitals:   08/04/21 1117 08/04/21 1121 08/04/21 1126 08/04/21 1131  BP: (!) 146/85 (!) 148/84 (!) 143/88 139/85  Pulse:      Resp: 15 15 17 13   Temp:      TempSrc:      SpO2: 100% 98% 97% 100%  Weight:      Height:         Start Time: 1113 hrs. End Time: 1130 hrs.  Imaging Guidance (Spinal):          Type of Imaging Technique: Fluoroscopy Guidance (Spinal) Indication(s): Assistance in needle guidance and placement for procedures requiring needle placement in or near specific anatomical locations not easily accessible without such assistance. Exposure Time: Please see nurses notes. Contrast: None used. Fluoroscopic Guidance: I was personally present during the use of fluoroscopy. "Tunnel Vision Technique" used to obtain the best possible view of the target area. Parallax error corrected before commencing  the procedure. "Direction-depth-direction" technique used to introduce the needle under continuous pulsed fluoroscopy. Once target was reached, antero-posterior, oblique, and lateral fluoroscopic projection used confirm needle placement in all planes. Images permanently stored in EMR. Interpretation: No contrast injected. I personally interpreted the imaging intraoperatively. Adequate needle placement confirmed in multiple planes. Permanent images saved into the patient's record.   Post-operative Assessment:  Post-procedure Vital Signs:  Pulse/HCG Rate: 63(!) 58 Temp: (!) 97.1 F (36.2 C) Resp: 13 BP: 139/85 SpO2: 100 %  EBL: None  Complications: No immediate post-treatment complications observed by team, or reported by patient.  Note: The patient tolerated the entire procedure well. A repeat set of vitals were taken after the procedure and the patient was kept under observation following institutional policy, for this type of procedure. Post-procedural neurological assessment was performed, showing return to baseline, prior to discharge. The patient was provided with post-procedure discharge instructions, including a section on how to identify potential problems. Should any problems arise concerning this procedure, the patient was given instructions to immediately contact us, at any time, without hesitation. In any case, we plan to contact the patient by telephone for a follow-up status report regarding this interventional procedure.  Comments:  No additional relevant information.  Plan of Care  Orders:  Orders Placed This Encounter  Procedures   DG PAIN [facility] C-ARM 1-60 MIN NO REPORT    Intraoperative interpretation by procedural physician at [place].    Standing Status:   Standing    Number of Occurrences:   1    Order Specific Question:   Reason for exam:    Answer:   Assistance in needle guidance and placement for procedures requiring needle placement in or near  specific anatomical locations not easily accessible without such assistance.    Medications ordered for procedure: Meds ordered this encounter  Medications   lidocaine (XYLOCAINE) 2 % (with pres) injection 400 mg   dexamethasone (DECADRON) injection 10 mg   dexamethasone (DECADRON) injection 10 mg   ropivacaine (PF) 2 mg/mL (0.2%) (NAROPIN) injection 9 mL   ropivacaine (PF) 2 mg/mL (0.2%) (NAROPIN) injection 9 mL   Medications administered: We administered lidocaine, dexamethasone, dexamethasone, ropivacaine (PF) 2 mg/mL (0.2%), and ropivacaine (PF) 2 mg/mL (  0.2%).  See the medical record for exact dosing, route, and time of administration.  Follow-up plan:   Return for Keep sch. appt.    Recent Visits Date Type Provider Dept  07/15/21 Office Visit [name], MD [facility]  06/17/21 Office Visit [name], MD [facility]  Showing recent visits within past 90 days and meeting all other requirements Today's Visits Date Type Provider Dept  08/04/21 Procedure visit [name], MD [facility]  Showing today's visits and meeting all other requirements Future Appointments Date Type Provider Dept  10/09/21 Appointment [name], MD [facility]  Showing future appointments within next 90 days and meeting all other requirements Disposition: Discharge home  Discharge (Date  Time): 08/04/2021; 1142 hrs.   Primary Care Physician: [name], NP Location: [facility] Note by: [name], MD Date: 08/04/2021; Time: 11:47 AM  Disclaimer:  Medicine is not an exact science. The only guarantee in medicine is that nothing is guaranteed. It is important to note that the decision to proceed with this intervention was based on the information collected from the patient. The Data and conclusions were drawn from the patient's questionnaire, the interview, and the physical examination. Because the information  was provided in large part by the patient, it cannot be guaranteed that it has not been purposely or unconsciously manipulated. Every effort has been made to obtain as much relevant data as possible for this evaluation. It is important to note that the conclusions that lead to this procedure are derived in large part from the available data. Always take into account that the treatment will also be dependent on availability of resources and existing treatment guidelines, considered by other Pain Management Practitioners as being common knowledge and practice, at the time of the intervention. For Medico-Legal purposes, it is also important to point out that variation in procedural techniques and pharmacological choices are the acceptable norm. The indications, contraindications, technique, and results of the above procedure should only be interpreted and judged by a Board-Certified Interventional Pain Specialist with extensive familiarity and expertise in the same exact procedure and technique.

## 2021-08-04 NOTE — Patient Instructions (Signed)
____________________________________________________________________________________________  Post-Procedure Discharge Instructions  Instructions: Apply ice:  Purpose: This will minimize any swelling and discomfort after procedure.  When: Day of procedure, as soon as you get home. How: Fill a plastic sandwich bag with crushed ice. Cover it with a small towel and apply to injection site. How long: (15 min on, 15 min off) Apply for 15 minutes then remove x 15 minutes.  Repeat sequence on day of procedure, until you go to bed. Apply heat:  Purpose: To treat any soreness and discomfort from the procedure. When: Starting the next day after the procedure. How: Apply heat to procedure site starting the day following the procedure. How long: May continue to repeat daily, until discomfort goes away. Food intake: Start with clear liquids (like water) and advance to regular food, as tolerated.  Physical activities: Keep activities to a minimum for the first 8 hours after the procedure. After that, then as tolerated. Driving: If you have received any sedation, be responsible and do not drive. You are not allowed to drive for 24 hours after having sedation. Blood thinner: (Applies only to those taking blood thinners) You may restart your blood thinner 6 hours after your procedure. Insulin: (Applies only to Diabetic patients taking insulin) As soon as you can eat, you may resume your normal dosing schedule. Infection prevention: Keep procedure site clean and dry. Shower daily and clean area with soap and water. Post-procedure Pain Diary: Extremely important that this be done correctly and accurately. Recorded information will be used to determine the next step in treatment. For the purpose of accuracy, follow these rules: Evaluate only the area treated. Do not report or include pain from an untreated area. For the purpose of this evaluation, ignore all other areas of pain, except for the treated  area. After your procedure, avoid taking a long nap and attempting to complete the pain diary after you wake up. Instead, set your alarm clock to go off every hour, on the hour, for the initial 8 hours after the procedure. Document the duration of the numbing medicine, and the relief you are getting from it. Do not go to sleep and attempt to complete it later. It will not be accurate. If you received sedation, it is likely that you were given a medication that may cause amnesia. Because of this, completing the diary at a later time may cause the information to be inaccurate. This information is needed to plan your care. Follow-up appointment: Keep your post-procedure follow-up evaluation appointment after the procedure (usually 2 weeks for most procedures, 6 weeks for radiofrequencies). DO NOT FORGET to bring you pain diary with you.   Expect: (What should I expect to see with my procedure?) From numbing medicine (AKA: Local Anesthetics): Numbness or decrease in pain. You may also experience some weakness, which if present, could last for the duration of the local anesthetic. Onset: Full effect within 15 minutes of injected. Duration: It will depend on the type of local anesthetic used. On the average, 1 to 8 hours.  From steroids (Applies only if steroids were used): Decrease in swelling or inflammation. Once inflammation is improved, relief of the pain will follow. Onset of benefits: Depends on the amount of swelling present. The more swelling, the longer it will take for the benefits to be seen. In some cases, up to 10 days. Duration: Steroids will stay in the system x 2 weeks. Duration of benefits will depend on multiple posibilities including persistent irritating factors. Side-effects: If present, they   may typically last 2 weeks (the duration of the steroids). Frequent: Cramps (if they occur, drink Gatorade and take over-the-counter Magnesium 450-500 mg once to twice a day); water retention with  temporary weight gain; increases in blood sugar; decreased immune system response; increased appetite. Occasional: Facial flushing (red, warm cheeks); mood swings; menstrual changes. Uncommon: Long-term decrease or suppression of natural hormones; bone thinning. (These are more common with higher doses or more frequent use. This is why we prefer that our patients avoid having any injection therapies in other practices.)  Very Rare: Severe mood changes; psychosis; aseptic necrosis. From procedure: Some discomfort is to be expected once the numbing medicine wears off. This should be minimal if ice and heat are applied as instructed.  Call if: (When should I call?) You experience numbness and weakness that gets worse with time, as opposed to wearing off. New onset bowel or bladder incontinence. (Applies only to procedures done in the spine)  Emergency Numbers: Durning business hours (Monday - Thursday, 8:00 AM - 4:00 PM) (Friday, 9:00 AM - 12:00 Noon): [phone] After hours: [phone] NOTE: If you are having a problem and are unable connect with, or to talk to a provider, then go to your nearest urgent care or emergency department. If the problem is serious and urgent, please call 911. ____________________________________________________________________________________________   ____________________________________________________________________________________________  Post-Procedure Discharge Instructions  Instructions: Apply ice:  Purpose: This will minimize any swelling and discomfort after procedure.  When: Day of procedure, as soon as you get home. How: Fill a plastic sandwich bag with crushed ice. Cover it with a small towel and apply to injection site. How long: (15 min on, 15 min off) Apply for 15 minutes then remove x 15 minutes.  Repeat sequence on day of procedure, until you go to bed. Apply heat:  Purpose: To treat any soreness and discomfort from the  procedure. When: Starting the next day after the procedure. How: Apply heat to procedure site starting the day following the procedure. How long: May continue to repeat daily, until discomfort goes away. Food intake: Start with clear liquids (like water) and advance to regular food, as tolerated.  Physical activities: Keep activities to a minimum for the first 8 hours after the procedure. After that, then as tolerated. Driving: If you have received any sedation, be responsible and do not drive. You are not allowed to drive for 24 hours after having sedation. Blood thinner: (Applies only to those taking blood thinners) You may restart your blood thinner 6 hours after your procedure. Insulin: (Applies only to Diabetic patients taking insulin) As soon as you can eat, you may resume your normal dosing schedule. Infection prevention: Keep procedure site clean and dry. Shower daily and clean area with soap and water. Post-procedure Pain Diary: Extremely important that this be done correctly and accurately. Recorded information will be used to determine the next step in treatment. For the purpose of accuracy, follow these rules: Evaluate only the area treated. Do not report or include pain from an untreated area. For the purpose of this evaluation, ignore all other areas of pain, except for the treated area. After your procedure, avoid taking a long nap and attempting to complete the pain diary after you wake up. Instead, set your alarm clock to go off every hour, on the hour, for the initial 8 hours after the procedure. Document the duration of the numbing medicine, and the relief you are getting from it. Do not go to sleep and attempt   to complete it later. It will not be accurate. If you received sedation, it is likely that you were given a medication that may cause amnesia. Because of this, completing the diary at a later time may cause the information to be inaccurate. This information is needed to plan  your care. Follow-up appointment: Keep your post-procedure follow-up evaluation appointment after the procedure (usually 2 weeks for most procedures, 6 weeks for radiofrequencies). DO NOT FORGET to bring you pain diary with you.   Expect: (What should I expect to see with my procedure?) From numbing medicine (AKA: Local Anesthetics): Numbness or decrease in pain. You may also experience some weakness, which if present, could last for the duration of the local anesthetic. Onset: Full effect within 15 minutes of injected. Duration: It will depend on the type of local anesthetic used. On the average, 1 to 8 hours.  From steroids (Applies only if steroids were used): Decrease in swelling or inflammation. Once inflammation is improved, relief of the pain will follow. Onset of benefits: Depends on the amount of swelling present. The more swelling, the longer it will take for the benefits to be seen. In some cases, up to 10 days. Duration: Steroids will stay in the system x 2 weeks. Duration of benefits will depend on multiple posibilities including persistent irritating factors. Side-effects: If present, they may typically last 2 weeks (the duration of the steroids). Frequent: Cramps (if they occur, drink Gatorade and take over-the-counter Magnesium 450-500 mg once to twice a day); water retention with temporary weight gain; increases in blood sugar; decreased immune system response; increased appetite. Occasional: Facial flushing (red, warm cheeks); mood swings; menstrual changes. Uncommon: Long-term decrease or suppression of natural hormones; bone thinning. (These are more common with higher doses or more frequent use. This is why we prefer that our patients avoid having any injection therapies in other practices.)  Very Rare: Severe mood changes; psychosis; aseptic necrosis. From procedure: Some discomfort is to be expected once the numbing medicine wears off. This should be minimal if ice and heat are  applied as instructed.  Call if: (When should I call?) You experience numbness and weakness that gets worse with time, as opposed to wearing off. New onset bowel or bladder incontinence. (Applies only to procedures done in the spine)  Emergency Numbers: Durning business hours (Monday - Thursday, 8:00 AM - 4:00 PM) (Friday, 9:00 AM - 12:00 Noon): [phone] After hours: [phone] NOTE: If you are having a problem and are unable connect with, or to talk to a provider, then go to your nearest urgent care or emergency department. If the problem is serious and urgent, please call 911. ____________________________________________________________________________________________   

## 2021-08-04 NOTE — Progress Notes (Signed)
Safety precautions to be maintained throughout the outpatient stay will include: orient to surroundings, keep bed in low position, maintain call bell within reach at all times, provide assistance with transfer out of bed and ambulation.  

## 2021-08-05 ENCOUNTER — Telehealth: Payer: Self-pay

## 2021-08-05 NOTE — Telephone Encounter (Signed)
Post procedure phone call.  Patient states he is doing OK.

## 2021-08-24 ENCOUNTER — Encounter: Payer: Self-pay | Admitting: Internal Medicine

## 2021-08-25 MED ORDER — PYRIDOSTIGMINE BROMIDE 60 MG PO TABS: 60.0000 mg | ORAL_TABLET | Freq: Two times a day (BID) | ORAL | 0 refills | Status: DC

## 2021-09-01 ENCOUNTER — Encounter: Payer: Self-pay | Admitting: Internal Medicine

## 2021-09-01 ENCOUNTER — Ambulatory Visit (INDEPENDENT_AMBULATORY_CARE_PROVIDER_SITE_OTHER): Payer: Medicare Other | Admitting: Internal Medicine

## 2021-09-01 ENCOUNTER — Other Ambulatory Visit: Payer: Self-pay

## 2021-09-01 DIAGNOSIS — G2581 Restless legs syndrome: Secondary | ICD-10-CM | POA: Diagnosis not present

## 2021-09-01 DIAGNOSIS — I951 Orthostatic hypotension: Secondary | ICD-10-CM | POA: Diagnosis not present

## 2021-09-01 DIAGNOSIS — F32A Depression, unspecified: Secondary | ICD-10-CM

## 2021-09-01 DIAGNOSIS — F419 Anxiety disorder, unspecified: Secondary | ICD-10-CM | POA: Diagnosis not present

## 2021-09-01 MED ORDER — DIAZEPAM 5 MG PO TABS: ORAL_TABLET | ORAL | 0 refills | Status: DC

## 2021-09-01 NOTE — Progress Notes (Signed)
Subjective:    Patient ID: Grant Young, male    DOB: 1946/07/17, 75 y.o.   MRN: [id number]  HPI  Pt presents to the clinic today for follow-up of orthostatic hypotension.  He was initially started on fludrocortisone but felt like he was having side effects with this so he requested an alternative medication.  He was switched to Pyridostigmine which he reports he took 1 dose of and had severe headache, abdominal cramping and diarrhea which lasted for about 4 days.  He is unsure if this medication cause this or if he was having flulike symptoms.  He reports he has not taken it since and has not had any issues with orthostasis.  He is also requesting to switch from Lorazepam to Diazepam to see if this helps with his restless legs.  Review of Systems     Past Medical History:  Diagnosis Date   Anxiety    Per New Patient Packet   Chronic back pain    Per New Patient Packet   Chronic heart disease    Per New Patient Packet   Depression    GERD (gastroesophageal reflux disease)    Headache    History of CT scan of brain 09/14/2018   Per New Patient Packet   History of depression    Per New Patient Packet   History of gastritis    Per New Patient Packet   History of headache    Per New Patient Packet   History of kidney stones    Per New Patient Packet   History of neuropathy    Per New Patient Packet   Hypertension    Per New Patient Packet   Insomnia    Kidney stone    Lumbar radicular pain    Per New Patient Packet   Lyme disease    Per New Patient Packet   Myocardial infarction [facility])    Overactive bladder    PONV (postoperative nausea and vomiting)    Skin cancer, basal cell    Sleep trouble    Per New Patient Packet   Spinal cord stimulator status    01/08/21 - not currently using.   Squamous cell skin cancer    Substance abuse (HCC)     Current Outpatient Medications  Medication Sig Dispense Refill   amitriptyline (ELAVIL) 10 MG tablet Take 3 tablets (30 mg  total) by mouth at bedtime. (Patient taking differently: Take 20 mg by mouth at bedtime.) 270 tablet 1   anastrozole (ARIMIDEX) 1 MG tablet Take 1 mg by mouth once a week.     butalbital-acetaminophen-caffeine (FIORICET) 50-325-40 MG tablet Take by mouth.     chlorthalidone (HYGROTON) 25 MG tablet TAKE 1 TABLET (25 MG TOTAL) BY MOUTH DAILY. 90 tablet 0   Cholecalciferol 125 MCG (5000 UT) TABS Take by mouth.     cholestyramine (QUESTRAN) 4 g packet Take 1 packet (4 g total) by mouth 3 (three) times daily. (Patient taking differently: Take 4 g by mouth 3 (three) times daily as needed.) 270 packet 3   Cyanocobalamin (VITAMIN B-12) 5000 MCG LOZG Take 1 lozenge by mouth daily.      dicyclomine (BENTYL) 20 MG tablet Take 20 mg by mouth as needed.     Digestive Aids Mixture (DIGESTION GB PO) Take 1 capsule by mouth daily.     escitalopram (LEXAPRO) 20 MG tablet TAKE 1 TABLET BY MOUTH EVERY DAY 90 tablet 0   ezetimibe (ZETIA) 10 MG tablet Take 1 tablet (  10 mg total) by mouth daily. 90 tablet 1   GLUTATHIONE PO Take 1 tablet by mouth daily.     HYDROcodone-acetaminophen (NORCO) 10-325 MG tablet Take 1-1.5 tablets by mouth 2 (two) times daily as needed. 90 tablet 0   [START ON 09/13/2021] HYDROcodone-acetaminophen (NORCO) 10-325 MG tablet Take 1-1.5 tablets by mouth 2 (two) times daily as needed. 90 tablet 0   Loperamide HCl (IMODIUM PO) Take by mouth as needed.      LORazepam (ATIVAN) 1 MG tablet Take 1 tablet (1 mg total) by mouth 2 (two) times daily. 60 tablet 0   melatonin 5 MG TABS Take 5 mg by mouth at bedtime.     NONFORMULARY OR COMPOUNDED ITEM Super Bi-Mix Injection  Papaverine HCI 30mg /mL Phentolamine Mesylate 1mg /mL Inject 0.50mL - 61mL Increase as needed to achieve erection (Patient taking differently: Super Bi-Mix Injection 150mg /5mg  Papaverine HCI 30mg /mL Phentolamine Mesylate 1mg /mL Inject 0.31mL - 28mL Increase as needed to achieve erection) 5 each 6   ondansetron (ZOFRAN) 4 MG tablet Take 4  mg by mouth as needed for nausea or vomiting.      OVER THE COUNTER MEDICATION G I Detox  - 2x/week Gastromend HP Daily Glutaloemine Daily Lithium Oronate Daily GABA Liposomal Daily Fungus Eliminator     Potassium 99 MG TABS Take by mouth 3 (three) times a week. K-Zyme     Probiotic Product (PROBIOTIC DAILY PO) Take by mouth.     prochlorperazine (COMPAZINE) 10 MG tablet Take by mouth.     pyridostigmine (MESTINON) 60 MG tablet Take 1 tablet (60 mg total) by mouth 2 (two) times daily. 60 tablet 0   RABEprazole (ACIPHEX) 20 MG tablet Take 1 tablet (20 mg total) by mouth 2 (two) times daily. 180 tablet 1   Rhodiola rosea (RHODIOLA PO) Take by mouth. (Patient not taking: Reported on 08/04/2021)     sucralfate (CARAFATE) 1 g tablet Take 1 g by mouth 4 (four) times daily as needed.      testosterone cypionate (DEPOTESTOSTERONE CYPIONATE) 200 MG/ML injection Inject 60 mg into the muscle every 14 (fourteen) days.     Theanine 50 MG TBDP Take by mouth at bedtime.     tizanidine (ZANAFLEX) 2 MG capsule Take 1 capsule (2 mg total) by mouth every 12 (twelve) hours as needed. 180 capsule 1   valACYclovir (VALTREX) 1000 MG tablet TAKE 2 TABLETS BY MOUTH AND REPEAT IN 12 HOURS FOR COLD SORE 4 tablet 1   Vibegron (GEMTESA) 75 MG TABS Take 75 mg by mouth daily. 90 tablet 3   No current facility-administered medications for this visit.    Allergies  Allergen Reactions   Ace Inhibitors     Other reaction(s): Cough   Fluoxetine Anxiety    "made me fall asleep" per pt "bad headaches and "makes  Me  Crazy" historical allergy noted in [employer] "made me fall asleep" per pt "bad headaches and "makes  Me  Crazy" Per New Patient Packet.     Metoclopramide     Other reaction(s): Other (See Comments), Other (See Comments), Unknown Tardive Dyskinesia  historical allergy noted in [employer] Tardive Dyskinesia  Per New Patient Packet.   Nalbuphine     Used Post Back surgery- Anesthesiologist Error. Patient  had Narcotic Withdraw. Per New Patient Packet.    Other     Other reaction(s): Other (See Comments) Altered mental status in combo with narcotics at previous hospitalization - Full Withdrawal Symptoms Other reaction(s): Rash   Amoxicillin-Pot Clavulanate Nausea Only  Per New Patient Packet.   Doxazosin Rash    Other reaction(s): Other - See Comments, Rash UNKNOWN REACTION UNKNOWN REACTION    Duloxetine Nausea Only    Per New Patient Packet.    Penicillins Nausea Only    Per New Patient Packet.   Tamsulosin Itching and Anxiety    Restless, Flushing, Heavy Chest, Itching, Hyperactive mood and Anxiety. Unable to handle side effects. Per New Patient Packet.     Trazodone And Nefazodone Itching, Anxiety and Rash    Headache. "INCREASED MY ANXIETY AND HEARTRATE" Flushing, tachycardia "INCREASED MY ANXIETY AND HEARTRATE" Per New Patient Packet.    Amlodipine     Shaking, unsure of reaction. Per New Patient Packet.     Cinoxacin     GI Intolerance, and Dizziness. Per New Patient Packet.    Ciprofloxacin     Other reaction(s): Unknown   Fludrocortisone Other (See Comments)    "Worsening headaches, GI issues, fatigue"   Nebivolol     Other reaction(s): Unknown   Olanzapine     Headache and unable to sleep for 3 nights. Per New Patient Packet.    Olmesartan     Other reaction(s): Unknown   Pregabalin     Confusion, Lack of concentration, dizziness, and likely drowsiness. Per New Patient Packet.     Prostaglandins     Other reaction(s): Other (See Comments) Intolerance   Thyroid Hormones     Other reaction(s): Other (See Comments) Thyroid (Nature Thyroid) contraindicated with some of your other medications.   Zolpidem     Nightmares, Ineffective after 2 days. Per New Patient Packet.    Duloxetine Hcl     Other reaction(s): Rash   Fluoxetine Hcl     Other reaction(s): Rash   Nucynta [Tapentadol] Other (See Comments)    Vertigo    Phenytoin Anxiety    Hyperactivity,  and Ineffective. Per New Patient Packet.     Family History  Problem Relation Age of Onset   Heart disease Father    Heart failure Father    Hypertension Father    Stroke Father    Stroke Mother    Dementia Mother    Kidney Stones Daughter    Anxiety disorder Daughter    OCD Daughter     Social History   Socioeconomic History   Marital status: Married    Spouse name: Not on file   Number of children: Not on file   Years of education: Not on file   Highest education level: Not on file  Occupational History   Not on file  Tobacco Use   Smoking status: Never   Smokeless tobacco: Never  Vaping Use   Vaping Use: Never used  Substance and Sexual Activity   Alcohol use: Yes    Comment: 1 Drink a Month, socially   Drug use: Not Currently   Sexual activity: Yes    Birth control/protection: None  Other Topics Concern   Not on file  Social History Narrative   Tobacco use, amount per day now: None   Past tobacco use, amount per day: None   How many years did you use tobacco: 0   Alcohol use (drinks per week): 0-1 Month   Diet:   Do you drink/eat things with caffeine: Occasionally ( Hot Chocolate and maybe 1/4 of 16oz Pepsi 2-3 times a week.   Marital status: Married  What year were you married? 1970   Do you live in a house, apartment, assisted living, condo, trailer, etc.? House   Is it one or more stories? One   How many persons live in your home? 2   Do you have pets in your home?( please list)  No   Highest Level of education completed: Masters   Current or past profession: Intensive Transport planner for [employer]   Do you exercise? Yes                                    Type and how often? [place], Recumbent Bike, 3 times a week. Try to get a 1.2 mile walk at least 3 times a week or more.     Do you have a living will? Yes   Do you have a DNR form?  No                                 If not, do you want to discuss  one?   Do you have signed POA/HPOA forms? Yes                       If so, please bring to you appointment    Do you have any difficulty bathing or dressing yourself? No    Do you have difficulty preparing food or eating? No   Do you have difficulty managing your medications? No   Do you have any difficulty managing your finances? No   Do you have any difficulty affording your medications? No         Social Determinants of Radio broadcast assistant Strain: Not on file  Food Insecurity: Not on file  Transportation Needs: Not on file  Physical Activity: Not on file  Stress: Not on file  Social Connections: Not on file  Intimate Partner Violence: Not on file     Constitutional: Patient reports intermittent headaches.  Denies fever, malaise, fatigue, or abrupt weight changes.  HEENT: Denies eye pain, eye redness, ear pain, ringing in the ears, wax buildup, runny nose, nasal congestion, bloody nose, or sore throat. Respiratory: Denies difficulty breathing, shortness of breath, cough or sputum production.   Cardiovascular: Denies chest pain, chest tightness, palpitations or swelling in the hands or feet.  Gastrointestinal: Patient reports abdominal pain and diarrhea which has now resolved.  Denies bloating, constipation, or blood in the stool.  Musculoskeletal: Denies decrease in range of motion, difficulty with gait, muscle pain or joint pain and swelling.  Skin: Denies redness, rashes, lesions or ulcercations.  Neurological: Patient reports restless legs.  Denies dizziness, difficulty with memory, difficulty with speech or problems with balance and coordination.  Psych: Patient has a history of anxiety and depression.  Denies SI/HI.  No other specific complaints in a complete review of systems (except as listed in HPI above).  Objective:   Physical Exam  BP 127/75 (BP Location: Left Arm, Patient Position: Sitting, Cuff Size: Normal)    Pulse 70    Temp 97.7 F (36.5 C) (Temporal)     Resp 16    Ht 5\' 9"  (1.753 m)    Wt 144 lb 9.6 oz (65.6 kg)    SpO2 100%    BMI 21.35 kg/m   Wt Readings from Last 3 Encounters:  08/04/21 145 lb (65.8 kg)  07/15/21 147 lb (66.7 kg)  07/07/21 147 lb (66.7 kg)    General: Appears his stated age, well developed, well nourished in NAD. Cardiovascular: Normal rate. Pulmonary/Chest: Normal effort. Musculoskeletal: No difficulty with gait.  Neurological: Alert and oriented.  Psychiatric: Mood and affect normal.  Mildly anxious appearing. Judgment and thought content normal.   BMET    Component Value Date/Time   NA 145 05/20/2020 0000   K 3.2 (A) 05/20/2020 0000   CL 102 05/20/2020 0000   CO2 34 (A) 05/20/2020 0000   BUN 1 (A) 05/20/2020 0000   CREATININE 0.90 03/06/2020 1134   CALCIUM 9.8 05/20/2020 0000   GFRNONAA 87 05/20/2020 0000   GFRAA 101 05/20/2020 0000    Lipid Panel     Component Value Date/Time   CHOL 170 05/16/2020 0000   TRIG 59 05/16/2020 0000   HDL 56 05/16/2020 0000   LDLCALC 100 05/16/2020 0000    CBC    Component Value Date/Time   WBC 6.2 05/20/2020 0000   RBC 5.21 (A) 05/20/2020 0000   HGB 15.6 05/20/2020 0000   HCT 47 05/20/2020 0000   PLT 199 05/20/2020 0000    Hgb A1C Lab Results  Component Value Date   HGBA1C 5.1 10/17/2019           Assessment & Plan:    Webb Silversmith, NP This visit occurred during the SARS-CoV-2 public health emergency.  Safety protocols were in place, including screening questions prior to the visit, additional usage of staff PPE, and extensive cleaning of exam room while observing appropriate contact time as indicated for disinfecting solutions.

## 2021-09-01 NOTE — Assessment & Plan Note (Signed)
Continue Escitalopram Will change Lorazepam to Diazepam Support offered

## 2021-09-01 NOTE — Patient Instructions (Signed)
Orthostatic Hypotension Blood pressure is a measurement of how strongly, or weakly, your circulating blood is pressing against the walls of your arteries. Orthostatic hypotension is a drop in blood pressure that can happen when you change positions, such as when you go from lying down to standing. Arteries are blood vessels that carry blood from your heart throughout your body. When blood pressure is too low, you may not get enough blood to your brain or to the rest of your organs. Orthostatic hypotension can cause light-headedness, sweating, rapid heartbeat, blurred vision, and fainting. These symptoms require further investigation into the cause. What are the causes? Orthostatic hypotension can be caused by many things, including: Sudden changes in posture, such as standing up quickly after you have been sitting or lying down. Loss of blood (anemia) or loss of body fluids (dehydration). Heart problems, neurologic problems, or hormone problems. Pregnancy. Aging. The risk for this condition increases as you get older. Severe infection (sepsis). Certain medicines, such as medicines for high blood pressure or medicines that make the body lose excess fluids (diuretics). What are the signs or symptoms? Symptoms of this condition may include: Weakness, light-headedness, or dizziness. Sweating. Blurred vision. Tiredness (fatigue). Rapid heartbeat. Fainting, in severe cases. How is this diagnosed? This condition is diagnosed based on: Your symptoms and medical history. Your blood pressure measurements. Your health care provider will check your blood pressure when you are: Lying down. Sitting. Standing. A blood pressure reading is recorded as two numbers, such as "120 over 80" (or 120/80). The first ("top") number is called the systolic pressure. It is a measure of the pressure in your arteries as your heart beats. The second ("bottom") number is called the diastolic pressure. It is a measure of  the pressure in your arteries when your heart relaxes between beats. Blood pressure is measured in a unit called mmHg. Healthy blood pressure for most adults is 120/80 mmHg. Orthostatic hypotension is defined as a 20 mmHg drop in systolic pressure or a 10 mmHg drop in diastolic pressure within 3 minutes of standing. Other information or tests that may be used to diagnose orthostatic hypotension include: Your other vital signs, such as your heart rate and temperature. Blood tests. An electrocardiogram (ECG) or echocardiogram. A Holter monitor. This is a device you wear that records your heart rhythm continuously, usually for 24-48 hours. Tilt table test. For this test, you will be safely secured to a table that moves you from a lying position to an upright position. Your heart rhythm and blood pressure will be monitored during the test. How is this treated? This condition may be treated by: Changing your diet. This may involve eating more salt (sodium) or drinking more water. Changing the dosage of certain medicines you are taking that might be lowering your blood pressure. Correcting the underlying reason for the orthostatic hypotension. Wearing compression stockings. Taking medicines to raise your blood pressure. Avoiding actions that trigger symptoms. Follow these instructions at home: Medicines Take over-the-counter and prescription medicines only as told by your health care provider. Follow instructions from your health care provider about changing the dosage of your current medicines, if this applies. Do not stop or adjust any of your medicines on your own. Eating and drinking  Drink enough fluid to keep your urine pale yellow. Eat extra salt only as directed. Do not add extra salt to your diet unless advised by your health care provider. Eat frequent, small meals. Avoid standing up suddenly after eating. General instructions    Get up slowly from lying down or sitting positions. This  gives your blood pressure a chance to adjust. Avoid hot showers and excessive heat as directed by your health care provider. Engage in regular physical activity as directed by your health care provider. If you have compression stockings, wear them as told. Keep all follow-up visits. This is important. Contact a health care provider if: You have a fever for more than 2-3 days. You feel more thirsty than usual. You feel dizzy or weak. Get help right away if: You have chest pain. You have a fast or irregular heartbeat. You become sweaty or feel light-headed. You feel short of breath. You faint. You have any symptoms of a stroke. "BE FAST" is an easy way to remember the main warning signs of a stroke: B - Balance. Signs are dizziness, sudden trouble walking, or loss of balance. E - Eyes. Signs are trouble seeing or a sudden change in vision. F - Face. Signs are sudden weakness or numbness of the face, or the face or eyelid drooping on one side. A - Arms. Signs are weakness or numbness in an arm. This happens suddenly and usually on one side of the body. S - Speech. Signs are sudden trouble speaking, slurred speech, or trouble understanding what people say. T - Time. Time to call emergency services. Write down what time symptoms started. You have other signs of a stroke, such as: A sudden, severe headache with no known cause. Nausea or vomiting. Seizure. These symptoms may represent a serious problem that is an emergency. Do not wait to see if the symptoms will go away. Get medical help right away. Call your local emergency services (911 in the [place] Do not drive yourself to the hospital. Summary Orthostatic hypotension is a sudden drop in blood pressure. It can cause light-headedness, sweating, rapid heartbeat, blurred vision, and fainting. Orthostatic hypotension can be diagnosed by having your blood pressure taken while lying down, sitting, and then standing. Treatment may involve  changing your diet, wearing compression stockings, sitting up slowly, adjusting your medicines, or correcting the underlying reason for the orthostatic hypotension. Get help right away if you have chest pain, a fast or irregular heartbeat, or symptoms of a stroke. This information is not intended to replace advice given to you by your health care provider. Make sure you discuss any questions you have with your health care provider. Document Revised: 11/14/2020 Document Reviewed: 11/14/2020 Elsevier Patient Education  2022 Elsevier Inc.  

## 2021-09-01 NOTE — Assessment & Plan Note (Signed)
Currently does not seem to be an issue Advised him to hold Pyridostigmine if not having symptoms Encouraged him to make position changes slowly

## 2021-09-01 NOTE — Assessment & Plan Note (Signed)
We will change Lorazepam to Diazepam to see if this helps

## 2021-09-17 ENCOUNTER — Telehealth: Payer: Self-pay

## 2021-09-17 NOTE — Telephone Encounter (Signed)
The patient called and said the billing office is consolidating his [place] bills and billing medicare and workers comp and the patient. All of Dr. Elmon Else bills should be billed to workers comp only. He spoke to [place] in billing and she told him there was nothing they could do about it, that's the way they bill. This doesn't seem right to me. They should bill everything from [name] separate to workers comp and they are not doing that. Do you know of anyone you can speak with about this situation, or is this all on the billing office?

## 2021-09-21 ENCOUNTER — Encounter: Payer: Self-pay | Admitting: Internal Medicine

## 2021-09-23 NOTE — Telephone Encounter (Signed)
Please review.  KP

## 2021-09-29 ENCOUNTER — Ambulatory Visit: Payer: Medicare Other | Admitting: Gastroenterology

## 2021-09-29 ENCOUNTER — Other Ambulatory Visit: Payer: Self-pay | Admitting: Internal Medicine

## 2021-09-29 NOTE — Telephone Encounter (Signed)
Requested Prescriptions  Pending Prescriptions Disp Refills   amitriptyline (ELAVIL) 10 MG tablet [Pharmacy Med Name: AMITRIPTYLINE HCL 10 MG TAB] 270 tablet 0    Sig: TAKE 3 TABLETS (30 MG TOTAL) BY MOUTH AT BEDTIME.     Psychiatry:  Antidepressants - Heterocyclics (TCAs) Passed - 09/29/2021  1:38 AM      Passed - Completed PHQ-2 or PHQ-9 in the last 360 days      Passed - Valid encounter within last 6 months    Recent Outpatient Visits          4 weeks ago Anxiety and depression   [facility] [place], [name], NP   2 months ago [facility] [place], [name], NP   5 months ago HTN (hypertension), benign   [facility] [place], [name], NP   7 months ago Essential hypertension   [place], [name], NP      Future Appointments            In 1 month [name], [name], NP [facility], [place]   In 2 months Diamantina [place], Herbert Seta, MD [facility]            RABEprazole ([place]) 20 MG tablet [Pharmacy Med Name: RABEPRAZOLE SOD DR 20 MG TAB] 180 tablet 0    Sig: TAKE 1 TABLET BY MOUTH TWICE A DAY     Gastroenterology: Proton Pump Inhibitors Passed - 09/29/2021  1:38 AM      Passed - Valid encounter within last 12 months    Recent Outpatient Visits          4 weeks ago Anxiety and depression   [name], NP   2 months ago [facility] [place], [name], NP   5 months ago HTN (hypertension), benign   [facility] [place], [name], NP   7 months ago Essential hypertension   [place], [name], NP      Future Appointments            In 1 month [name], [name], NP [facility], [place]   In 2 months Diamantina [place], Herbert Seta, [place]

## 2021-10-01 ENCOUNTER — Other Ambulatory Visit: Payer: Self-pay | Admitting: Internal Medicine

## 2021-10-01 DIAGNOSIS — F419 Anxiety disorder, unspecified: Secondary | ICD-10-CM

## 2021-10-01 MED ORDER — DIAZEPAM 5 MG PO TABS: ORAL_TABLET | ORAL | 0 refills | Status: DC

## 2021-10-01 NOTE — Telephone Encounter (Signed)
Requested Prescriptions  Pending Prescriptions Disp Refills   escitalopram (LEXAPRO) 20 MG tablet [Pharmacy Med Name: ESCITALOPRAM 20 MG TABLET] 90 tablet 0    Sig: TAKE 1 TABLET BY MOUTH EVERY DAY     Psychiatry:  Antidepressants - SSRI Passed - 10/01/2021  1:28 AM      Passed - Completed PHQ-2 or PHQ-9 in the last 360 days      Passed - Valid encounter within last 6 months    Recent Outpatient Visits          1 month ago Anxiety and depression   [place], NP   2 months ago [facility] [place], [place], NP   6 months ago HTN (hypertension), benign   [facility] [place], [name], NP   7 months ago Essential hypertension   [place], [name], NP      Future Appointments            In 1 month [name], [name], NP [facility], [place]   In 2 months Diamantina [place], Herbert Seta, [place]

## 2021-10-09 ENCOUNTER — Other Ambulatory Visit: Payer: Self-pay

## 2021-10-09 ENCOUNTER — Encounter: Payer: Self-pay | Admitting: Student in an Organized Health Care Education/Training Program

## 2021-10-09 ENCOUNTER — Ambulatory Visit
Payer: Worker's Compensation | Attending: Student in an Organized Health Care Education/Training Program | Admitting: Student in an Organized Health Care Education/Training Program

## 2021-10-09 VITALS — BP 139/77 | HR 57 | Temp 97.4°F | Resp 15 | Ht 69.0 in | Wt 142.0 lb

## 2021-10-09 DIAGNOSIS — M792 Neuralgia and neuritis, unspecified: Secondary | ICD-10-CM | POA: Diagnosis not present

## 2021-10-09 DIAGNOSIS — G43701 Chronic migraine without aura, not intractable, with status migrainosus: Secondary | ICD-10-CM

## 2021-10-09 DIAGNOSIS — M47816 Spondylosis without myelopathy or radiculopathy, lumbar region: Secondary | ICD-10-CM

## 2021-10-09 DIAGNOSIS — M5481 Occipital neuralgia: Secondary | ICD-10-CM

## 2021-10-09 DIAGNOSIS — G894 Chronic pain syndrome: Secondary | ICD-10-CM | POA: Diagnosis present

## 2021-10-09 DIAGNOSIS — Z9689 Presence of other specified functional implants: Secondary | ICD-10-CM

## 2021-10-09 MED ORDER — HYDROCODONE BITARTRATE ER 10 MG PO CP12: 10.0000 mg | ORAL_CAPSULE | Freq: Two times a day (BID) | ORAL | 0 refills | Status: AC

## 2021-10-09 MED ORDER — HYDROCODONE BITARTRATE ER 10 MG PO CP12: 10.0000 mg | ORAL_CAPSULE | Freq: Two times a day (BID) | ORAL | 0 refills | Status: DC

## 2021-10-09 NOTE — Progress Notes (Signed)
PROVIDER NOTE: Information contained herein reflects review and annotations entered in association with encounter. Interpretation of such information and data should be left to medically-trained personnel. Information provided to patient can be located elsewhere in the medical record under "Patient Instructions". Document created using STT-dictation technology, any transcriptional errors that may result from process are unintentional.    Patient: Grant Young  Service Category: E/M  Provider: [name], MD  DOB: 06/30/1946  DOS: 10/09/2021  Specialty: Interventional Pain Management  MRN: [id number]  Setting: Ambulatory outpatient  PCP: Grant Fenton, NP  Type: Established Patient    Referring Provider: [name], NP  Location: Office  Delivery: Face-to-face     HPI  [name], a 76 y.o. 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 [name] primary complain today is Back Pain (lower)  Last encounter: My last encounter with him was on 08/04/21  Pertinent problems: 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 Pain Assessment: Severity of Chronic pain is reported as a 5 /10. Location: Back Lower/pt states pain sometimes, albeit rarely, radiates down left leg. Onset: More than a month ago. Quality: Aching. Timing: Constant. Modifying factor(s): meds, procedure. Vitals:  height is '5\' 9"'  (1.753 m) and weight is 142 lb (64.4 kg). His tympanic temperature is 97.4 F (36.3 C) (abnormal). His blood pressure is 139/77 and his pulse is 57 (abnormal). His respiration is 15 and oxygen saturation is 100%.   Reason for encounter: both, medication management and post-procedure assessment.   Post-procedure evaluation   Type: Lumbar Facet, Medial  Branch Block(s) #2 (for 2022, had 2 done in 2021)  Primary Purpose: Therapeutic Region: Posterolateral Lumbosacral Spine Level: L3, L4, L5, Medial Branch Level(s). Injecting these levels blocks the L3-4, L4-5,  lumbar facet joints. Laterality: Bilateral  Effectiveness:  Initial hour after procedure: 80 %  Subsequent 4-6 hours post-procedure: 60 %  Analgesia past initial 6 hours:  ("It took a couple of weeks before pain remained at 50% better than pre-procedure") 50% Ongoing improvement:  Analgesic:  50% Function: [name] reports improvement in function ROM: [name] reports improvement in ROM      Pharmacotherapy Assessment  Analgesic: Patient feels his rebound headaches may be exacerbated by acetaminophen.  We will discontinue short acting hydrocodone and trial long-acting hydrocodone as below.  Monitoring: [place] PMP: PDMP reviewed during this encounter.       Pharmacotherapy: No side-effects or adverse reactions reported. Compliance: No problems identified. Effectiveness: Clinically acceptable.  UDS:  Summary  Date Value Ref Range Status  09/17/2020 Note  Final    Comment:    ==================================================================== Compliance Drug Analysis, Ur ==================================================================== Specimen Alert Note: Urinary creatinine is low; ability to detect some drugs may be compromised. Interpret results with caution. (Creatinine) ==================================================================== Test                             Result       Flag       Units  Drug Present and Declared for Prescription Verification   Lorazepam                      1107         EXPECTED   ng/mg creat  Source of lorazepam is a scheduled prescription medication.    Butalbital                     PRESENT      EXPECTED   Citalopram                     PRESENT      EXPECTED   Desmethylcitalopram            PRESENT      EXPECTED     Desmethylcitalopram is an expected metabolite of citalopram or the    enantiomeric form, escitalopram.    Acetaminophen                  PRESENT      EXPECTED   Propranolol                    PRESENT      EXPECTED  Drug Present not Declared for Prescription Verification   Oxazepam                       300          UNEXPECTED ng/mg creat   Temazepam                      357          UNEXPECTED ng/mg creat    Oxazepam and temazepam are expected metabolites of diazepam.    Oxazepam is also an expected metabolite of other benzodiazepine    drugs, including chlordiazepoxide, prazepam, clorazepate, halazepam,    and temazepam.  Oxazepam and temazepam are available as scheduled    prescription medications.  Drug Absent but Declared for Prescription Verification   Hydrocodone                    Not Detected UNEXPECTED ng/mg creat   Tizanidine                     Not Detected UNEXPECTED    Tizanidine, as indicated in the declared medication list, is not    always detected even when used as directed.    Amitriptyline                  Not Detected UNEXPECTED   Prochlorperazine               Not Detected UNEXPECTED   Salicylate                     Not Detected UNEXPECTED    Aspirin, as indicated in the declared medication list, is not always    detected even when used as directed.  ==================================================================== Test                      Result    Flag   Units      Ref Range   Creatinine              14        LL     mg/dL      >=20 ==================================================================== Declared Medications:  The flagging and interpretation on this report are based on the  following declared medications.  Unexpected results may arise from  inaccuracies in the declared medications.   **Note: The testing scope of this panel includes these medications:   Amitriptyline (Elavil)  Butalbital (  Fioricet)  Butalbital (Fiorinal)  Escitalopram  (Lexapro)  Hydrocodone (Norco)  Lorazepam (Ativan)  Prochlorperazine (Compazine)  Propranolol (Inderal)   **Note: The testing scope of this panel does not include small to  moderate amounts of these reported medications:   Acetaminophen (Fioricet)  Acetaminophen (Norco)  Aspirin (Fiorinal)  Tizanidine (Zanaflex)   **Note: The testing scope of this panel does not include the  following reported medications:   Anastrozole (Arimidex)  Caffeine (Fioricet)  Caffeine (Fiorinal)  Chlorthalidone ([place])  Cholecalciferol  Cholestyramine (Questran)  Dicyclomine (Bentyl)  Ezetimibe (Zetia)  Hydrocortisone  Melatonin  Ondansetron (Zofran)  Potassium (Klor-Con)  Rabeprazole (Aciphex)  Sucralfate (Carafate)  Valacyclovir (Valtrex)  Vitamin B12 ==================================================================== For clinical consultation, please call [phone]. ====================================================================       ROS  Constitutional: Denies any fever or chills Gastrointestinal: No reported hemesis, hematochezia, vomiting, or acute GI distress Musculoskeletal:  mid, low  back pain Neurological: No reported episodes of acute onset apraxia, aphasia, dysarthria, agnosia, amnesia, paralysis, loss of coordination, or loss of consciousness  Medication Review  Cholecalciferol, Digestive Aids Mixture, Glutathione, HYDROcodone Bitartrate ER, Loperamide HCl, NONFORMULARY OR COMPOUNDED ITEM, Potassium, Probiotic Product, RABEprazole, Theanine, Vibegron, Vitamin B-12, amitriptyline, anastrozole, butalbital-acetaminophen-caffeine, chlorthalidone, cholestyramine, diazepam, dicyclomine, escitalopram, ezetimibe, melatonin, ondansetron, prochlorperazine, propranolol, pyridostigmine, sucralfate, testosterone cypionate, tizanidine, and valACYclovir  History Review  Allergy: [name] is allergic to ace inhibitors, fluoxetine, metoclopramide, nalbuphine, other,  amoxicillin-pot clavulanate, doxazosin, duloxetine, penicillins, tamsulosin, trazodone and nefazodone, amlodipine, cinoxacin, ciprofloxacin, fludrocortisone, nebivolol, olanzapine, olmesartan, pregabalin, prostaglandins, thyroid hormones, zolpidem, duloxetine hcl, fluoxetine hcl, nucynta [tapentadol], and phenytoin. Drug: [name]  reports that he does not currently use drugs. Alcohol:  reports current alcohol use. Tobacco:  reports that he has never smoked. He has never used smokeless tobacco. Social: [name]  reports that he has never smoked. He has never used smokeless tobacco. He reports current alcohol use. He reports that he does not currently use drugs. Medical:  has a past medical history of Anxiety, Chronic back pain, Chronic heart disease, Depression, GERD (gastroesophageal reflux disease), Headache, History of CT scan of brain (09/14/2018), History of depression, History of gastritis, History of headache, History of kidney stones, History of neuropathy, Hypertension, Insomnia, Kidney stone, Lumbar radicular pain, Lyme disease, Myocardial infarction ([place]), Overactive bladder, PONV (postoperative nausea and vomiting), Skin cancer, basal cell, Sleep trouble, Spinal cord stimulator status, Squamous cell skin cancer, and Substance abuse ([place]). Surgical: [name]  has a past surgical history that includes Kidney stone surgery (09/14/1980); Kidney stone surgery (09/15/1995); Lithotripsy (09/14/2014); Lithotripsy (09/14/1996); Lithotripsy (09/14/1997); Gallbladder surgery (09/15/2015); Sigmoidoscopy (09/14/2017); Colonoscopy (09/15/2015); Cholecystectomy (2017); Shoulder surgery (Right, 1984); Back surgery; Spinal cord stimulator implant; Pain pump implantation; Pain pump removal; Tonsillectomy; Foot surgery (Bilateral, 03/18/2020); Foot surgery (08/032021); Upper gi endoscopy; Cardiac catheterization; Colonoscopy with propofol (N/A, 10/03/2020); Esophagogastroduodenoscopy (egd) with propofol (N/A,  10/03/2020); Cataract extraction w/PHACO (Left, 01/14/2021); and Cataract extraction w/PHACO (Right, 01/28/2021). Family: family history includes Anxiety disorder in his daughter; Dementia in his mother; Heart disease in his father; Heart failure in his father; Hypertension in his father; Kidney Stones in his daughter; OCD in his daughter; Stroke in his father and mother.  Laboratory Chemistry Profile   Renal Lab Results  Component Value Date   BUN 1 (A) 05/20/2020   CREATININE 0.90 03/06/2020   GFRAA 101 05/20/2020   GFRNONAA 87 05/20/2020     Hepatic Lab Results  Component Value Date   AST 18 05/20/2020   ALT 24 05/20/2020   ALBUMIN 4.2 05/20/2020  ALKPHOS 149 (H) 09/16/2020     Electrolytes Lab Results  Component Value Date   NA 145 05/20/2020   K 3.2 (A) 05/20/2020   CL 102 05/20/2020   CALCIUM 9.8 05/20/2020     Bone Lab Results  Component Value Date   TESTOSTERONE 33.2 05/20/2020     Inflammation (CRP: Acute Phase) (ESR: Chronic Phase) No results found for: CRP, ESRSEDRATE, LATICACIDVEN     Note: Above Lab results reviewed.  Physical Exam  General appearance: Well nourished, well developed, and well hydrated. In no apparent acute distress Mental status: Alert, oriented x 3 (person, place, & time)       Respiratory: No evidence of acute respiratory distress Eyes: PERLA Vitals: BP 139/77    Pulse (!) 57    Temp (!) 97.4 F (36.3 C) (Tympanic)    Resp 15    Ht '5\' 9"'  (1.753 m)    Wt 142 lb (64.4 kg)    SpO2 100%    BMI 20.97 kg/m  BMI: Estimated body mass index is 20.97 kg/m as calculated from the following:   Height as of this encounter: '5\' 9"'  (1.753 m).   Weight as of this encounter: 142 lb (64.4 kg). Ideal: Ideal body weight: 70.7 kg (155 lb 13.8 oz)  Lumbar Spine Area Exam  Skin & Axial Inspection: Well healed scar from previous spine surgery detected IPG present from SCS Alignment: Scoliosis detected Functional ROM: Pain restricted ROM       Stability:  No instability detected Muscle Tone/Strength: Functionally intact. No obvious neuro-muscular anomalies detected. Sensory (Neurological): Facet agenic pain pattern, pain with facet loading Gait & Posture Assessment  Ambulation: Unassisted Gait: Relatively normal for age and body habitus Posture: WNL   Lower Extremity Exam    Side: Right lower extremity  Side: Left lower extremity  Stability: No instability observed          Stability: No instability observed          Skin & Extremity Inspection: Skin color, temperature, and hair growth are WNL. No peripheral edema or cyanosis. No masses, redness, swelling, asymmetry, or associated skin lesions. No contractures.  Skin & Extremity Inspection: Skin color, temperature, and hair growth are WNL. No peripheral edema or cyanosis. No masses, redness, swelling, asymmetry, or associated skin lesions. No contractures.  Functional ROM: Unrestricted ROM                  Functional ROM: Unrestricted ROM                  Muscle Tone/Strength: Functionally intact. No obvious neuro-muscular anomalies detected.  Muscle Tone/Strength: Functionally intact. No obvious neuro-muscular anomalies detected.  Sensory (Neurological): Unimpaired        Sensory (Neurological): Unimpaired        DTR: Patellar: deferred today Achilles: deferred today Plantar: deferred today  DTR: Patellar: deferred today Achilles: deferred today Plantar: deferred today  Palpation: No palpable anomalies  Palpation: No palpable anomalies    Assessment   Status Diagnosis  Persistent Controlled Controlled 1. 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 Lumbar facet arthropathy   3. Lumbar spondylosis   4. Bilateral occipital neuralgia   5. Neuropathic pain   6. 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 Chronic pain syndrome      Plan of Care  Grant Young has a current medication list which includes the following long-term  medication(s): amitriptyline, chlorthalidone, cholestyramine, dicyclomine, escitalopram,  ezetimibe, potassium, pyridostigmine, rabeprazole, sucralfate, and testosterone cypionate.  Pharmacotherapy (Medications Ordered): Meds ordered this encounter  Medications   HYDROcodone Bitartrate ER 10 MG CP12    Sig: Take 10 mg by mouth every 12 (twelve) hours. Must last 30 days.    Dispense:  60 capsule    Refill:  0    Chronic Pain: STOP Act (Not applicable) Fill 1 day early if closed on refill date. Avoid benzodiazepines within 8 hours of opioids   HYDROcodone Bitartrate ER 10 MG CP12    Sig: Take 10 mg by mouth every 12 (twelve) hours. Must last 30 days.    Dispense:  60 capsule    Refill:  0    Chronic Pain: STOP Act (Not applicable) Fill 1 day early if closed on refill date. Avoid benzodiazepines within 8 hours of opioids  Discontinue immediate acting hydrocodone.  Follow-up plan:   Return in about 8 weeks (around 12/04/2021) for Medication Management, in person.     Status post Botox No. 1 on 01/01/2020 for migraine, lumbar TPI as well. SPG block 03/13/20. Bilateral GONB 04/01/20. 11/13/20 B/L L3,4,5 Facet blocks helpful repeat prn          Recent Visits Date Type Provider Dept  08/04/21 Procedure visit [name], MD [facility]  07/15/21 Office Visit [name], MD [facility]  Showing recent visits within past 90 days and meeting all other requirements Today's Visits Date Type Provider Dept  10/09/21 Office Visit [name], MD [facility]  Showing today's visits and meeting all other requirements Future Appointments No visits were found meeting these conditions. Showing future appointments within next 90 days and meeting all other requirements  I discussed the assessment and treatment plan with the patient. The patient was provided an opportunity to ask questions and all were answered. The patient agreed with the plan and demonstrated an understanding  of the instructions.  Patient advised to call back or seek an in-person evaluation if the symptoms or condition worsens.  Duration of encounter: 44mnutes.  Note by: BGillis Santa MD Date: 10/09/2021; Time: 12:02 PM

## 2021-10-09 NOTE — Progress Notes (Signed)
Nursing Pain Medication Assessment:  Safety precautions to be maintained throughout the outpatient stay will include: orient to surroundings, keep bed in low position, maintain call bell within reach at all times, provide assistance with transfer out of bed and ambulation.  Medication Inspection Compliance: Pill count conducted under aseptic conditions, in front of the patient. Neither the pills nor the bottle was removed from the patient's sight at any time. Once count was completed pills were immediately returned to the patient in their original bottle.  Medication: Hydrocodone/APAP Pill/Patch Count:  50 of 90 pills remain Pill/Patch Appearance: Markings consistent with prescribed medication Bottle Appearance: Standard pharmacy container. Clearly labeled. Filled Date: 01 / 06 / 2023 Last Medication intake:  Today

## 2021-10-13 ENCOUNTER — Telehealth: Payer: Self-pay

## 2021-10-13 NOTE — Telephone Encounter (Signed)
Grant Young came in last week stating the billing office was not billing w/c correctly. He said he was suppposed to have one account number for our clinic. I spoke with a rep at the billing office. She states he cannot have one account number for [name], because we create a new account number each visit. She also said that there is a note in the system stating all pain clinic visits be billed to w/c. As long as we change the guarantor to w/c at check in there shouldn't be an issue with billing.

## 2021-10-15 ENCOUNTER — Telehealth: Payer: Self-pay

## 2021-10-15 ENCOUNTER — Other Ambulatory Visit: Payer: Self-pay | Admitting: Internal Medicine

## 2021-10-15 DIAGNOSIS — I10 Essential (primary) hypertension: Secondary | ICD-10-CM

## 2021-10-15 MED ORDER — PYRIDOSTIGMINE BROMIDE 60 MG PO TABS: 60.0000 mg | ORAL_TABLET | Freq: Two times a day (BID) | ORAL | 0 refills | Status: DC

## 2021-10-15 NOTE — Telephone Encounter (Signed)
Pt's wife stopped by the office to say that the pharmacy has not been able to get Gemtesa in and they would like some samples until the can get the medication. Samples x 1 month provided.

## 2021-10-15 NOTE — Telephone Encounter (Signed)
Spoke with pt in regards to [place]. After review, pt should have 1 90 day refill remaining. Pt is scheduled for office visit 3/29 at which time he will get a refill. Pt acknowledges he does have one refill remaining, expressed understanding.

## 2021-10-15 NOTE — Telephone Encounter (Signed)
Requested Prescriptions  Pending Prescriptions Disp Refills   chlorthalidone (HYGROTON) 25 MG tablet [Pharmacy Med Name: CHLORTHALIDONE 25 MG TABLET] 90 tablet 0    Sig: TAKE 1 TABLET (25 MG TOTAL) BY MOUTH DAILY.     There is no refill protocol information for this order

## 2021-10-16 ENCOUNTER — Telehealth: Payer: Self-pay | Admitting: Student in an Organized Health Care Education/Training Program

## 2021-10-16 NOTE — Telephone Encounter (Signed)
This was confirmed . I asked Dr. Holley Raring and he states patient needs to wait for the medication to come to the pharmacy. Patient called and made aware.

## 2021-10-16 NOTE — Telephone Encounter (Signed)
Patient lvmail saying [facility] does not have [facility] to fill his script. He tried to call [facility] but they wont tell him if they have enough. Please call to advise patient.

## 2021-10-25 ENCOUNTER — Encounter: Payer: Self-pay | Admitting: Internal Medicine

## 2021-10-25 DIAGNOSIS — B009 Herpesviral infection, unspecified: Secondary | ICD-10-CM

## 2021-10-28 ENCOUNTER — Other Ambulatory Visit: Payer: Self-pay | Admitting: Internal Medicine

## 2021-10-28 NOTE — Telephone Encounter (Signed)
Requested medication (s) are due for refill today: Yes  Requested medication (s) are on the active medication list: Yes  Last refill:  10/01/21  Future visit scheduled: Yes  Notes to clinic:  Unable to refill per protocol, cannot delegate. Requesting to be sent in a day early due to going [place] on Saturday for a funeral.    Requested Prescriptions  Pending Prescriptions Disp Refills   diazepam (VALIUM) 5 MG tablet 90 tablet 0    Sig: 5 mg PO in am and 2 tabs PO QHS     Not Delegated - Psychiatry: Anxiolytics/Hypnotics 2 Failed - 10/28/2021 10:34 AM      Failed - This refill cannot be delegated      Failed - Urine Drug Screen completed in last 360 days      Passed - Patient is not pregnant      Passed - Valid encounter within last 6 months    Recent Outpatient Visits           1 month ago Anxiety and depression   [place], [name], NP   3 months ago [facility] [place], [place], NP   6 months ago HTN (hypertension), benign   [facility] [place], [name], NP   8 months ago Essential hypertension   [place], NP       Future Appointments             In 2 weeks Garnette Gunner, [name], NP [facility], [place]   In 1 month Diamantina [place], Herbert Seta, [place]

## 2021-10-28 NOTE — Telephone Encounter (Signed)
Pts wife called and they are going to [facility] this Saturday for funeral and pts wife asked if Rollene Fare can send refill for diazepam (VALIUM) 5 MG tablet  a day early so he can get it before they leave on Saturday / please advise asap   [id number]  [address], [place] [address]  Phone:  [phone]  Fax:  [phone]

## 2021-10-29 ENCOUNTER — Other Ambulatory Visit: Payer: Self-pay | Admitting: Internal Medicine

## 2021-10-29 MED ORDER — VALACYCLOVIR HCL 1 G PO TABS: ORAL_TABLET | ORAL | 0 refills | Status: AC

## 2021-10-29 MED ORDER — DIAZEPAM 5 MG PO TABS: ORAL_TABLET | ORAL | 0 refills | Status: DC

## 2021-10-29 NOTE — Telephone Encounter (Signed)
Discontinued 08/25/22. Not on current med profile.  Requested Prescriptions  Pending Prescriptions Disp Refills   fludrocortisone (FLORINEF) 0.1 MG tablet [Pharmacy Med Name: FLUDROCORTISONE 0.1 MG TABLET] 90 tablet 0    Sig: TAKE 1 TABLET BY MOUTH EVERY DAY     Not Delegated - Endocrinology: Oral Corticosteroids - fludrocortisone Failed - 10/29/2021  2:51 AM      Failed - This refill cannot be delegated      Failed - Manual Review: Eye exam for IOP if prolonged treatment      Failed - K in normal range and within 180 days    Potassium  Date Value Ref Range Status  05/20/2020 3.2 (A) 3.4 - 5.3 Final         Failed - Na in normal range and within 180 days    Sodium  Date Value Ref Range Status  05/20/2020 145 137 - 147 Final         Failed - Glucose (serum) in normal range and within 180 days    Glucose  Date Value Ref Range Status  05/20/2020 94  Final         Failed - Bone Mineral Density or Dexa Scan completed in the last 2 years      Passed - Last BP in normal range    BP Readings from Last 1 Encounters:  10/09/21 139/77         Passed - Valid encounter within last 6 months    Recent Outpatient Visits          1 month ago Anxiety and depression   [place], [name], NP   3 months ago [facility], [name], NP   6 months ago HTN (hypertension), benign   [facility], [name], NP   8 months ago Essential hypertension   [place], [name], NP      Future Appointments            In 1 week Garnette Gunner, [name], NP [facility] [place]   In 1 month Diam[facility], Herbert Seta, [facility]

## 2021-10-29 NOTE — Telephone Encounter (Signed)
Please call the pharmacy and okayed the Valium to be filled today.  He had a death in the family will be leaving out of town.

## 2021-10-30 NOTE — Telephone Encounter (Signed)
This is done.   Thanks,   -Mickel Baas

## 2021-11-05 ENCOUNTER — Encounter: Payer: Medicare Other | Admitting: Internal Medicine

## 2021-11-11 ENCOUNTER — Ambulatory Visit (INDEPENDENT_AMBULATORY_CARE_PROVIDER_SITE_OTHER): Payer: Medicare Other | Admitting: Internal Medicine

## 2021-11-11 ENCOUNTER — Other Ambulatory Visit: Payer: Self-pay

## 2021-11-11 ENCOUNTER — Telehealth: Payer: Self-pay

## 2021-11-11 ENCOUNTER — Encounter: Payer: Self-pay | Admitting: Internal Medicine

## 2021-11-11 VITALS — BP 130/82 | HR 58 | Temp 96.8°F | Ht 68.5 in | Wt 144.0 lb

## 2021-11-11 DIAGNOSIS — E7800 Pure hypercholesterolemia, unspecified: Secondary | ICD-10-CM | POA: Insufficient documentation

## 2021-11-11 DIAGNOSIS — R350 Frequency of micturition: Secondary | ICD-10-CM | POA: Insufficient documentation

## 2021-11-11 DIAGNOSIS — Z125 Encounter for screening for malignant neoplasm of prostate: Secondary | ICD-10-CM

## 2021-11-11 DIAGNOSIS — R0683 Snoring: Secondary | ICD-10-CM

## 2021-11-11 DIAGNOSIS — Z79899 Other long term (current) drug therapy: Secondary | ICD-10-CM

## 2021-11-11 DIAGNOSIS — G4719 Other hypersomnia: Secondary | ICD-10-CM | POA: Diagnosis not present

## 2021-11-11 DIAGNOSIS — N401 Enlarged prostate with lower urinary tract symptoms: Secondary | ICD-10-CM

## 2021-11-11 NOTE — Progress Notes (Signed)
HPI:  Patient presents to clinic today for subsequent annual Medicare wellness exam.  Past Medical History:  Diagnosis Date   Anxiety    Per New Patient Packet   Chronic back pain    Per New Patient Packet   Chronic heart disease    Per New Patient Packet   Depression    GERD (gastroesophageal reflux disease)    Headache    History of CT scan of brain 09/14/2018   Per New Patient Packet   History of depression    Per New Patient Packet   History of gastritis    Per New Patient Packet   History of headache    Per New Patient Packet   History of kidney stones    Per New Patient Packet   History of neuropathy    Per New Patient Packet   Hypertension    Per New Patient Packet   Insomnia    Kidney stone    Lumbar radicular pain    Per New Patient Packet   Lyme disease    Per New Patient Packet   Myocardial infarction [facility])    Overactive bladder    PONV (postoperative nausea and vomiting)    Skin cancer, basal cell    Sleep trouble    Per New Patient Packet   Spinal cord stimulator status    01/08/21 - not currently using.   Squamous cell skin cancer    Substance abuse (HCC)     Current Outpatient Medications  Medication Sig Dispense Refill   amitriptyline (ELAVIL) 10 MG tablet TAKE 3 TABLETS (30 MG TOTAL) BY MOUTH AT BEDTIME. 270 tablet 0   anastrozole (ARIMIDEX) 1 MG tablet Take 1 mg by mouth once a week.     butalbital-acetaminophen-caffeine (FIORICET) 50-325-40 MG tablet Take by mouth.     chlorthalidone (HYGROTON) 25 MG tablet TAKE 1 TABLET (25 MG TOTAL) BY MOUTH DAILY. 90 tablet 0   Cholecalciferol 125 MCG (5000 UT) TABS Take by mouth.     cholestyramine (QUESTRAN) 4 g packet Take 1 packet (4 g total) by mouth 3 (three) times daily. (Patient taking differently: Take 4 g by mouth 3 (three) times daily as needed.) 270 packet 3   Cyanocobalamin (VITAMIN B-12) 5000 MCG LOZG Take 1 lozenge by mouth daily.      diazepam (VALIUM) 5 MG tablet 5 mg PO in am and 2 tabs PO  QHS 90 tablet 0   dicyclomine (BENTYL) 20 MG tablet Take 20 mg by mouth as needed.     Digestive Aids Mixture (DIGESTION GB PO) Take 1 capsule by mouth daily.     escitalopram (LEXAPRO) 20 MG tablet TAKE 1 TABLET BY MOUTH EVERY DAY 90 tablet 1   ezetimibe (ZETIA) 10 MG tablet Take 1 tablet (10 mg total) by mouth daily. 90 tablet 1   GLUTATHIONE PO Take 1 tablet by mouth daily.     HYDROcodone Bitartrate ER 10 MG CP12 Take 10 mg by mouth every 12 (twelve) hours. Must last 30 days. 60 capsule 0   Loperamide HCl (IMODIUM PO) Take by mouth as needed.      melatonin 5 MG TABS Take 5 mg by mouth at bedtime.     NONFORMULARY OR COMPOUNDED ITEM Super Bi-Mix Injection  Papaverine HCI 30mg /mL Phentolamine Mesylate 1mg /mL Inject 0.68mL - 33mL Increase as needed to achieve erection (Patient taking differently: Super Bi-Mix Injection 150mg /5mg  Papaverine HCI 30mg /mL Phentolamine Mesylate 1mg /mL Inject 0.2mL - 87mL Increase as needed to achieve erection) 5  each 6   ondansetron (ZOFRAN) 4 MG tablet Take 4 mg by mouth as needed for nausea or vomiting.      Potassium 99 MG TABS Take by mouth 3 (three) times a week. K-Zyme     Probiotic Product (PROBIOTIC DAILY PO) Take by mouth.     prochlorperazine (COMPAZINE) 10 MG tablet Take by mouth.     propranolol (INDERAL) 40 MG tablet Take 40 mg by mouth 2 (two) times daily.     pyridostigmine (MESTINON) 60 MG tablet Take 1 tablet (60 mg total) by mouth 2 (two) times daily. 180 tablet 0   RABEprazole (ACIPHEX) 20 MG tablet TAKE 1 TABLET BY MOUTH TWICE A DAY 180 tablet 0   sucralfate (CARAFATE) 1 g tablet Take 1 g by mouth 4 (four) times daily as needed.      testosterone cypionate (DEPOTESTOSTERONE CYPIONATE) 200 MG/ML injection Inject 60 mg into the muscle every 14 (fourteen) days.     Theanine 50 MG TBDP Take by mouth at bedtime.     tizanidine (ZANAFLEX) 2 MG capsule Take 1 capsule (2 mg total) by mouth every 12 (twelve) hours as needed. 180 capsule 1   valACYclovir  (VALTREX) 1000 MG tablet Take 2 tabs p.o. and repeat in 12 hours as needed for cold sore 30 tablet 0   Vibegron (GEMTESA) 75 MG TABS Take 75 mg by mouth daily. 90 tablet 3   No current facility-administered medications for this visit.    Allergies  Allergen Reactions   Ace Inhibitors     Other reaction(s): Cough   Fluoxetine Anxiety    "made me fall asleep" per pt "bad headaches and "makes  Me  Crazy" historical allergy noted in [employer] "made me fall asleep" per pt "bad headaches and "makes  Me  Crazy" Per New Patient Packet.     Metoclopramide     Other reaction(s): Other (See Comments), Other (See Comments), Unknown Tardive Dyskinesia  historical allergy noted in [employer] Tardive Dyskinesia  Per New Patient Packet.   Nalbuphine     Used Post Back surgery- Anesthesiologist Error. Patient had Narcotic Withdraw. Per New Patient Packet.    Other     Other reaction(s): Other (See Comments) Altered mental status in combo with narcotics at previous hospitalization - Full Withdrawal Symptoms Other reaction(s): Rash   Amoxicillin-Pot Clavulanate Nausea Only    Per New Patient Packet.   Doxazosin Rash    Other reaction(s): Other - See Comments, Rash UNKNOWN REACTION UNKNOWN REACTION    Duloxetine Nausea Only    Per New Patient Packet.    Penicillins Nausea Only    Per New Patient Packet.   Tamsulosin Itching and Anxiety    Restless, Flushing, Heavy Chest, Itching, Hyperactive mood and Anxiety. Unable to handle side effects. Per New Patient Packet.     Trazodone And Nefazodone Itching, Anxiety and Rash    Headache. "INCREASED MY ANXIETY AND HEARTRATE" Flushing, tachycardia "INCREASED MY ANXIETY AND HEARTRATE" Per New Patient Packet.    Amlodipine     Shaking, unsure of reaction. Per New Patient Packet.     Cinoxacin     GI Intolerance, and Dizziness. Per New Patient Packet.    Ciprofloxacin     Other reaction(s): Unknown   Fludrocortisone Other (See Comments)     "Worsening headaches, GI issues, fatigue"   Nebivolol     Other reaction(s): Unknown   Olanzapine     Headache and unable to sleep for 3 nights. Per New Patient Packet.  Olmesartan     Other reaction(s): Unknown   Pregabalin     Confusion, Lack of concentration, dizziness, and likely drowsiness. Per New Patient Packet.     Prostaglandins     Other reaction(s): Other (See Comments) Intolerance   Thyroid Hormones     Other reaction(s): Other (See Comments) Thyroid (Nature Thyroid) contraindicated with some of your other medications.   Zolpidem     Nightmares, Ineffective after 2 days. Per New Patient Packet.    Duloxetine Hcl     Other reaction(s): Rash   Fluoxetine Hcl     Other reaction(s): Rash   Nucynta [Tapentadol] Other (See Comments)    Vertigo    Phenytoin Anxiety    Hyperactivity, and Ineffective. Per New Patient Packet.     Family History  Problem Relation Age of Onset   Heart disease Father    Heart failure Father    Hypertension Father    Stroke Father    Stroke Mother    Dementia Mother    Kidney Stones Daughter    Anxiety disorder Daughter    OCD Daughter     Social History   Socioeconomic History   Marital status: Married    Spouse name: Not on file   Number of children: Not on file   Years of education: Not on file   Highest education level: Not on file  Occupational History   Not on file  Tobacco Use   Smoking status: Never   Smokeless tobacco: Never  Vaping Use   Vaping Use: Never used  Substance and Sexual Activity   Alcohol use: Yes    Comment: 1 Drink a Month, socially   Drug use: Not Currently   Sexual activity: Yes    Birth control/protection: None  Other Topics Concern   Not on file  Social History Narrative   Tobacco use, amount per day now: None   Past tobacco use, amount per day: None   How many years did you use tobacco: 0   Alcohol use (drinks per week): 0-1 Month   Diet:   Do you drink/eat things with caffeine:  Occasionally ( Hot Chocolate and maybe 1/4 of 16oz Pepsi 2-3 times a week.   Marital status: Married                                  What year were you married? 1970   Do you live in a house, apartment, assisted living, condo, trailer, etc.? House   Is it one or more stories? One   How many persons live in your home? 2   Do you have pets in your home?( please list)  No   Highest Level of education completed: Masters   Current or past profession: Intensive Transport planner for [employer]   Do you exercise? Yes                                    Type and how often? [place], Recumbent Bike, 3 times a week. Try to get a 1.2 mile walk at least 3 times a week or more.     Do you have a living will? Yes   Do you have a DNR form?  No  If not, do you want to discuss one?   Do you have signed POA/HPOA forms? Yes                       If so, please bring to you appointment    Do you have any difficulty bathing or dressing yourself? No    Do you have difficulty preparing food or eating? No   Do you have difficulty managing your medications? No   Do you have any difficulty managing your finances? No   Do you have any difficulty affording your medications? No         Social Determinants of Health   Financial Resource Strain: Not on file  Food Insecurity: Not on file  Transportation Needs: Not on file  Physical Activity: Not on file  Stress: Not on file  Social Connections: Not on file  Intimate Partner Violence: Not on file    Hospitiliaztions: 01/2021, cataract, 09/2020 colonoscopy  Health Maintenance:    Flu: 06/2018  Tetanus: 07/2020  Pneumovax: 02/2017  Prevnar: 12/2013  Zostavax: 09/2015  Shingrix: 03/2017, 06/2018  COVID: Moderna x3  PSA: 08/2019  Colon Screening: 09/2020  Eye Doctor:annually  Dental Exam: biannually   Providers:   PCP: Webb Silversmith, NP  Dermatologist: [facility]  Gastroenterologist: Dr.  Vicente Males  Urologist: Dr. Alexander Bergeron  Pain management: Dr. Holley Raring  Ophthalmologist: Dr. Valentina Gu  Neurologist: Dr. Delorse Lek     I have personally reviewed and have noted:  1. The patient's medical and social history 2. Their use of alcohol, tobacco or illicit drugs 3. Their current medications and supplements 4. The patient's functional ability including ADL's, fall risks, home safety risks and hearing or visual impairment. 5. Diet and physical activities 6. Evidence for depression or mood disorder  Subjective:   Review of Systems:   Constitutional: Patient reports frequent headaches, chronic fatigue.  Denies fever, malaise, or abrupt weight changes.  HEENT: Denies eye pain, eye redness, ear pain, ringing in the ears, wax buildup, runny nose, nasal congestion, bloody nose, or sore throat. Respiratory: Denies difficulty breathing, shortness of breath, cough or sputum production.   Cardiovascular: Denies chest pain, chest tightness, palpitations or swelling in the hands or feet.  Gastrointestinal: Denies abdominal pain, bloating, constipation, diarrhea or blood in the stool.  GU: Patient reports urinary frequency.  Denies urgency, pain with urination, burning sensation, blood in urine, odor or discharge. Musculoskeletal: Patient reports chronic joint pain.  Denies decrease in range of motion, difficulty with gait, muscle pain or joint pain and swelling.  Skin: Denies redness, rashes, lesions or ulcercations.  Neurological: Patient reports tremor of hands, restless legs, insomnia.  Denies dizziness, difficulty with memory, difficulty with speech or problems with balance and coordination.  Psych: Patient has a history of anxiety and depression.  Denies SI/HI.  No other specific complaints in a complete review of systems (except as listed in HPI above).  Objective:  PE:   BP 130/82 (BP Location: Right Arm, Patient Position: Sitting, Cuff Size: Normal)    Pulse (!) 58    Temp (!) 96.8 F (36  C) (Temporal)    Ht 5' 8.5" (1.74 m)    Wt 144 lb (65.3 kg)    SpO2 100%    BMI 21.58 kg/m   Wt Readings from Last 3 Encounters:  10/09/21 142 lb (64.4 kg)  09/01/21 144 lb 9.6 oz (65.6 kg)  08/04/21 145 lb (65.8 kg)    General: Appears his stated age, well  developed, well nourished in NAD. Skin: Warm, dry and intact.  HEENT: Head: normal shape and size; Eyes: EOMs intact;  Neck: Neck supple, trachea midline. No masses, lumps or thyromegaly present.  Cardiovascular: Bradycardic with normal rhythm. S1,S2 noted.  No murmur, rubs or gallops noted. No JVD or BLE edema. No carotid bruits noted. Pulmonary/Chest: Normal effort and positive vesicular breath sounds. No respiratory distress. No wheezes, rales or ronchi noted.  Abdomen: Soft and nontender. Normal bowel sounds. No distention or masses noted. Liver, spleen and kidneys non palpable. Musculoskeletal: Strength 5/5 BUE/BLE. No difficulty with gait. Neurological: Alert and oriented.  Slight resting tremor noted of bilateral hands.  Coordination normal.  Psychiatric: Mood and affect normal. Behavior is normal. Judgment and thought content normal.    BMET    Component Value Date/Time   NA 145 05/20/2020 0000   K 3.2 (A) 05/20/2020 0000   CL 102 05/20/2020 0000   CO2 34 (A) 05/20/2020 0000   BUN 1 (A) 05/20/2020 0000   CREATININE 0.90 03/06/2020 1134   CALCIUM 9.8 05/20/2020 0000   GFRNONAA 87 05/20/2020 0000   GFRAA 101 05/20/2020 0000    Lipid Panel     Component Value Date/Time   CHOL 170 05/16/2020 0000   TRIG 59 05/16/2020 0000   HDL 56 05/16/2020 0000   LDLCALC 100 05/16/2020 0000    CBC    Component Value Date/Time   WBC 6.2 05/20/2020 0000   RBC 5.21 (A) 05/20/2020 0000   HGB 15.6 05/20/2020 0000   HCT 47 05/20/2020 0000   PLT 199 05/20/2020 0000    Hgb A1C Lab Results  Component Value Date   HGBA1C 5.1 10/17/2019      Assessment and Plan:   Medicare Annual Wellness Visit:  Diet: He does eat  meat.  He consumes fruits and vegetables.  He tries to avoid fried foods.  He drinks mostly water Physical activity: Walking Depression/mood screen: Chronic, PHQ 9 score of 7 Hearing: Intact to whispered voice Visual acuity: Grossly normal, performs annual eye exam  ADLs: Capable Fall risk: None Home safety: Good Cognitive evaluation: Intact to orientation, naming, recall and repetition EOL planning: Adv directives, full code/ I agree  Preventative Medicine: He declines flu shot.  Tetanus UTD.  Pneumovax and Prevnar UTD.  Zostavax and Shingrix UTD.  COVID-vaccine UTD.  Colon screening UTD.  Encouraged him to consume a balanced diet and exercise regimen.  Advised him to see an eye doctor and dentist annually.  Labs that he brought with him reviewed and scanned into the chart.  Check lipid and PSA today.  Due dates for screening exam given to patient as part of the AVS.  Snoring, Excessive Daytime Sleepiness:  We will refer to pulmonology for consideration of sleep study  Next appointment: 6 months, follow-up chronic conditions   Webb Silversmith, NP This visit occurred during the SARS-CoV-2 public health emergency.  Safety protocols were in place, including screening questions prior to the visit, additional usage of staff PPE, and extensive cleaning of exam room while observing appropriate contact time as indicated for disinfecting solutions.

## 2021-11-11 NOTE — Chronic Care Management (AMB) (Signed)
°  Chronic Care Management   Outreach Note  11/11/2021 Name: Grant Young MRN: [id number] DOB: 1946-06-21  Grant Young is a 76 y.o. year old male who is a primary care patient of Grant Fenton, NP. I reached out to Grant Young by phone today in response to a referral sent by Grant Young [facility] primary care provider.  An unsuccessful telephone outreach was attempted today. The patient was referred to the case management team for assistance with care management and care coordination.   Follow Up Plan: A HIPAA compliant phone message was left for the patient providing contact information and requesting a return call.  The care management team will reach out to the patient again over the next 7 days.  If patient returns call to provider office, please advise to call [facility] * at [phone]*  Noreene Larsson, [place], [place] [facility] [employer] [facility]  [place], [place] [address] Direct Dial: [phone] [email or web address]: [email or web address]

## 2021-11-11 NOTE — Patient Instructions (Signed)
Health Maintenance After Age 74 After age 74, you are at a higher risk for certain long-term diseases and infections as well as injuries from falls. Falls are a major cause of broken bones and head injuries in people who are older than age 74. Getting regular preventive care can help to keep you healthy and well. Preventive care includes getting regular testing and making lifestyle changes as recommended by your health care provider. Talk with your health care provider about: Which screenings and tests you should have. A screening is a test that checks for a disease when you have no symptoms. A diet and exercise plan that is right for you. What should I know about screenings and tests to prevent falls? Screening and testing are the best ways to find a health problem early. Early diagnosis and treatment give you the best chance of managing medical conditions that are common after age 74. Certain conditions and lifestyle choices may make you more likely to have a fall. Your health care provider may recommend: Regular vision checks. Poor vision and conditions such as cataracts can make you more likely to have a fall. If you wear glasses, make sure to get your prescription updated if your vision changes. Medicine review. Work with your health care provider to regularly review all of the medicines you are taking, including over-the-counter medicines. Ask your health care provider about any side effects that may make you more likely to have a fall. Tell your health care provider if any medicines that you take make you feel dizzy or sleepy. Strength and balance checks. Your health care provider may recommend certain tests to check your strength and balance while standing, walking, or changing positions. Foot health exam. Foot pain and numbness, as well as not wearing proper footwear, can make you more likely to have a fall. Screenings, including: Osteoporosis screening. Osteoporosis is a condition that causes  the bones to get weaker and break more easily. Blood pressure screening. Blood pressure changes and medicines to control blood pressure can make you feel dizzy. Depression screening. You may be more likely to have a fall if you have a fear of falling, feel depressed, or feel unable to do activities that you used to do. Alcohol use screening. Using too much alcohol can affect your balance and may make you more likely to have a fall. Follow these instructions at home: Lifestyle Do not drink alcohol if: Your health care provider tells you not to drink. If you drink alcohol: Limit how much you have to: 0-1 drink a day for women. 0-2 drinks a day for men. Know how much alcohol is in your drink. In the [place] one drink equals one 12 oz bottle of beer (355 mL), one 5 oz glass of wine (148 mL), or one 1 oz glass of hard liquor (44 mL). Do not use any products that contain nicotine or tobacco. These products include cigarettes, chewing tobacco, and vaping devices, such as e-cigarettes. If you need help quitting, ask your health care provider. Activity  Follow a regular exercise program to stay fit. This will help you maintain your balance. Ask your health care provider what types of exercise are appropriate for you. If you need a cane or walker, use it as recommended by your health care provider. Wear supportive shoes that have nonskid soles. Safety  Remove any tripping hazards, such as rugs, cords, and clutter. Install safety equipment such as grab bars in bathrooms and safety rails on stairs. Keep rooms and walkways   well-lit. General instructions Talk with your health care provider about your risks for falling. Tell your health care provider if: You fall. Be sure to tell your health care provider about all falls, even ones that seem minor. You feel dizzy, tiredness (fatigue), or off-balance. Take over-the-counter and prescription medicines only as told by your health care provider. These include  supplements. Eat a healthy diet and maintain a healthy weight. A healthy diet includes low-fat dairy products, low-fat (lean) meats, and fiber from whole grains, beans, and lots of fruits and vegetables. Stay current with your vaccines. Schedule regular health, dental, and eye exams. Summary Having a healthy lifestyle and getting preventive care can help to protect your health and wellness after age 74. Screening and testing are the best way to find a health problem early and help you avoid having a fall. Early diagnosis and treatment give you the best chance for managing medical conditions that are more common for people who are older than age 74. Falls are a major cause of broken bones and head injuries in people who are older than age 74. Take precautions to prevent a fall at home. Work with your health care provider to learn what changes you can make to improve your health and wellness and to prevent falls. This information is not intended to replace advice given to you by your health care provider. Make sure you discuss any questions you have with your health care provider. Document Revised: 01/20/2021 Document Reviewed: 01/20/2021 Elsevier Patient Education  2022 [employer] Inc.  

## 2021-11-12 ENCOUNTER — Other Ambulatory Visit: Payer: Self-pay | Admitting: Nurse Practitioner

## 2021-11-12 ENCOUNTER — Encounter: Payer: Self-pay | Admitting: Internal Medicine

## 2021-11-12 DIAGNOSIS — I219 Acute myocardial infarction, unspecified: Secondary | ICD-10-CM

## 2021-11-12 HISTORY — DX: Acute myocardial infarction, unspecified: I21.9

## 2021-11-12 LAB — LIPID PANEL
Cholesterol: 170 mg/dL (ref <200)
HDL: 48 mg/dL (ref >=40)
LDL Cholesterol (Calc): 103 mg/dL (calc) — ABNORMAL HIGH
Non-HDL Cholesterol (Calc): 122 mg/dL (calc) (ref <130)
Total CHOL/HDL Ratio: 3.5 (calc) (ref <5.0)
Triglycerides: 99 mg/dL (ref <150)

## 2021-11-12 LAB — PSA: PSA: 0.29 ng/mL (ref <=4.00)

## 2021-11-12 NOTE — Chronic Care Management (AMB) (Addendum)
?  Chronic Care Management  ? ?Note ? ?11/12/2021 ?Name: GILLIAN KLUEVER MRN: [id number] DOB: 03/03/1946 ? ?FORTUNATO NORDIN is a 76 y.o. year old male who is a primary care patient of [name], NP. I reached out to [name] by phone today in response to a referral sent by Mr. Savan Ruta [facility] PCP. ? ?Mr. Ostrovsky was given information about Chronic Care Management services today including:  ?CCM service includes personalized support from designated clinical staff supervised by his physician, including individualized plan of care and coordination with other care providers ?24/7 contact phone numbers for assistance for urgent and routine care needs. ?Service will only be billed when office clinical staff spend 20 minutes or more in a month to coordinate care. ?Only one practitioner may furnish and bill the service in a calendar month. ?The patient may stop CCM services at any time (effective at the end of the month) by phone call to the office staff. ?The patient is responsible for co-pay (up to 20% after annual deductible is met) if co-pay is required by the individual health plan.  ? ?Patient agreed to services and verbal consent obtained.  ? ?Follow up plan: ?Face to Face appointment with care management team member scheduled for: 11/21/2021 ? ?Noreene Larsson, RMA ?Care Guide, Embedded Care Coordination ?[facility]  ?[place], [place] [address] ?Direct Dial: [phone] ?[email or web address] ?Website: [email or web address]  ? ?

## 2021-11-13 MED ORDER — PROPRANOLOL HCL 40 MG PO TABS: 40.0000 mg | ORAL_TABLET | Freq: Two times a day (BID) | ORAL | 0 refills | Status: DC

## 2021-11-14 ENCOUNTER — Ambulatory Visit: Payer: Medicare Other

## 2021-11-21 ENCOUNTER — Ambulatory Visit: Payer: Medicare Other

## 2021-11-26 ENCOUNTER — Ambulatory Visit (INDEPENDENT_AMBULATORY_CARE_PROVIDER_SITE_OTHER): Payer: Medicare Other | Admitting: Pharmacist

## 2021-11-26 ENCOUNTER — Other Ambulatory Visit: Payer: Self-pay

## 2021-11-26 VITALS — BP 132/83 | HR 94

## 2021-11-26 DIAGNOSIS — E7800 Pure hypercholesterolemia, unspecified: Secondary | ICD-10-CM

## 2021-11-26 DIAGNOSIS — G4719 Other hypersomnia: Secondary | ICD-10-CM

## 2021-11-26 DIAGNOSIS — I10 Essential (primary) hypertension: Secondary | ICD-10-CM

## 2021-11-26 DIAGNOSIS — K219 Gastro-esophageal reflux disease without esophagitis: Secondary | ICD-10-CM

## 2021-11-26 DIAGNOSIS — F32A Depression, unspecified: Secondary | ICD-10-CM

## 2021-11-26 NOTE — Patient Instructions (Signed)
Visit Information ? ?Thank you for taking time to visit with me today. Please don't hesitate to contact me if I can be of assistance to you before our next scheduled telephone appointment. ? ?Following are the goals we discussed today:  ? Goals Addressed   ? ?  ?  ?  ?  ? This Visit's Progress  ?  Pharmacy Goals     ?  Please check your home blood pressure, keep a log of the results and bring this with you to your medical appointments. ? ?Feel free to call me with any questions or concerns. I look forward to our next call! ? ?Wallace Cullens, PharmD, BCACP, CPP ?Clinical Pharmacist ?[facility] ?[place] ?[phone] ?  ? ?  ? ? ? ?Our next appointment is by telephone on 12/12/2021 at 10:45 AM ? ?Please call the care guide team at [phone] if you need to cancel or reschedule your appointment.  ? ? ?Patient verbalizes understanding of instructions and care plan provided today and agrees to view in [place]. Active MyChart status confirmed with patient.   ? ?

## 2021-11-26 NOTE — Chronic Care Management (AMB) (Signed)
? ?Chronic Care Management ?CCM Pharmacy Note ? ?11/26/2021 ?Name:  Grant Young MRN:  [id number] DOB:  02-07-1946 ? ? ?Subjective: ?Grant Young is an 76 y.o. year old male who is a primary patient of Grant Fenton, NP.  The CCM team was consulted for assistance with disease management and care coordination needs.   ? ?Engaged with patient and his wife today face to face for initial visit for pharmacy case management and/or care coordination services.  ? ?Objective: ? ?Medications Reviewed Today   ? ? Reviewed by Rennis Petty, RPH-CPP (Pharmacist) on 11/26/21 at 1432  Med List Status: <None>  ? ?Medication Order Taking? Sig Documenting Provider Last Dose Status Informant  ?amitriptyline (ELAVIL) 10 MG tablet [id number] Yes TAKE 3 TABLETS (30 MG TOTAL) BY MOUTH AT BEDTIME.  ?Patient taking differently: Take 10 mg by mouth at bedtime.  ? Grant Fenton, NP Taking Active   ?anastrozole (ARIMIDEX) 1 MG tablet [id number] Yes 1/2 tablet (0.5 mg) by mouth once weekly [provider] Taking Active   ?butalbital-acetaminophen-caffeine (FIORICET) 50-325-40 MG tablet [id number] Yes Take 1 tablet by mouth every 6 (six) hours as needed for headache. [provider] Taking Active Self  ?cetirizine (ZYRTEC) 10 MG tablet [id number] Yes Take 10 mg by mouth daily as needed for allergies. [provider] Taking Active   ?chlorthalidone (HYGROTON) 25 MG tablet [id number] Yes TAKE 1 TABLET (25 MG TOTAL) BY MOUTH DAILY. Grant Fenton, NP Taking Active   ?Cholecalciferol 50 MCG (2000 UT) CAPS [id number] Yes Take 1 tablet by mouth daily. [provider] Taking Active Self  ?cholestyramine (QUESTRAN) 4 g packet [id number] Yes Take 1 packet (4 g total) by mouth 3 (three) times daily.  ?Patient taking differently: Take 4 g by mouth daily as needed.  ? [name], MD Taking Active   ?Cyanocobalamin (VITAMIN B-12) 5000 MCG LOZG [id number] Yes Take 1 lozenge by mouth daily.  [provider]  Taking Active Self  ?DHEA 25 MG CAPS [id number] Yes Take 25 mg by mouth daily. [provider] Taking Active   ?diazepam (VALIUM) 5 MG tablet [id number] Yes 5 mg PO in am and 2 tabs PO QHS Grant Fenton, NP Taking Active   ?dicyclomine (BENTYL) 20 MG tablet [id number] Yes Take 20 mg by mouth as needed. [provider] Taking Active   ?Digestive Aids Mixture (DIGESTION GB PO) [id number] Yes Take 1 capsule by mouth daily. [provider] Taking Active   ?escitalopram (LEXAPRO) 20 MG tablet [id number] Yes TAKE 1 TABLET BY MOUTH EVERY DAY [name], [name], NP Taking Active   ?ezetimibe (ZETIA) 10 MG tablet [id number] Yes Take 1 tablet (10 mg total) by mouth daily. [name], NP Taking Active Self  ?         ?Med Note Curley Spice, Marnette Perkins A   Wed Nov 26, 2021  9:39 AM) Reports taking 1 tablet twice weekly  ?HYDROcodone Bitartrate ER 10 MG CP12 [id number] Yes Take 10 mg by mouth every 12 (twelve) hours. Must last 30 days. Grant Santa, MD Taking Active   ?LITHIUM OROTATE PO [id number] Yes Take by mouth. [provider] Taking Active   ?Loperamide HCl (IMODIUM PO) [id number] Yes Take by mouth as needed.  [provider] Taking Active Self  ?MAGNESIUM BISGLYCINATE PO [id number] Yes Take by mouth. [provider] Taking Active   ?melatonin 5 MG TABS [id number] Yes Take 5 mg by mouth at bedtime. [provider] Taking Active Self  ?  NONFORMULARY OR COMPOUNDED ITEM [id number] Yes Super Bi-Mix Injection  ?Papaverine HCI '30mg'$ /mL Phentolamine Mesylate '1mg'$ /mL ?Inject 0.728m - 126mIncrease as needed to achieve erection  ?Patient taking differently: Super Bi-Mix Injection '150mg'$ /'5mg'$  ?Papaverine HCI '30mg'$ /mL Phentolamine Mesylate '1mg'$ /mL ?Inject 0.28m28m 1mL128mcrease as needed to achieve erection  ? [name] Taking Active   ?Nutritional Supplements (NUTRITIONAL SUPPLEMENT PO) [id number] Take by mouth. [name] [provider] Taking Active    ?Nutritional Supplements (NUTRITIONAL SUPPLEMENT PO) [id number] Take by mouth. [name] [provider] Taking Active   ?ondansetron (ZOFRAN) 4 MG tablet [id number] Take 4 mg by mouth every 6 (six) hours as needed for nausea or vomiting. [provider] Taking Active Self  ?polyethylene glycol (MIRALAX / GLYCOLAX) 17 g packet [id number] Take 17 g by mouth daily as needed. [provider] Taking Active   ?Potassium 99 MG TABS [id number] Take by mouth 3 (three) times a week. K-Zyme [provider] Taking Active   ?Probiotic Product (PROBIOTIC DAILY PO) [id number] Take by mouth. [provider] Taking Active   ?prochlorperazine (COMPAZINE) 10 MG tablet [id number] Take by mouth. [provider] Taking Active   ?propranolol (INDERAL) 40 MG tablet [id number] Take 1 tablet (40 mg total) by mouth 2 (two) times daily. BaitJearld Young Taking Active   ?pyridostigmine (MESTINON) 60 MG tablet [id number] Take 1 tablet (60 mg total) by mouth 2 (two) times daily.  ?Patient taking differently: Take 30 mg by mouth 2 (two) times daily.  ? BaitJearld Young Taking Active   ?RABEprazole (ACIPHEX) 20 MG tablet [id number] TAKE 1 TABLET BY MOUTH TWICE A DAY [name], Grant Young Taking Active   ?sucralfate (CARAFATE) 1 g tablet [id number] Take 1 g by mouth 4 (four) times daily as needed.  [provider] Taking Active Self  ?testosterone cypionate (DEPOTESTOSTERONE CYPIONATE) 200 MG/ML injection [id number] Inject 60 mg into the muscle every 14 (fourteen) days. [provider] Taking Active   ?tizanidine (ZANAFLEX) 2 MG capsule [id number] 1 capsule (2 mg total) by mouth every 12 (twelve) hours as needed.  ?Patient not taking: Reported on 11/26/2021  ? BaitJearld Young Not Taking Active   ?valACYclovir (VALTREX) 1000 MG tablet [id number] Take 2 tabs p.o. and repeat in 12 hours as needed for cold sore BaitJearld FentonP Taking Active   ?Vibegron (GEMTESA) 75 MG TABS [id number] Take 75 mg by mouth daily. VailDebroah Loop-C Taking Active   ?Med List Note (WelRise Patience 10/09/21 1156): MR 12/08/21;  ?UDS 09-17-20  ? ?  ?  ? ?  ? ? ?Pertinent Labs:  ? ?Lab Results  ?Component Value Date  ? CHOL 170 11/11/2021  ? HDL 48 11/11/2021  ? LDLCALC 103 (H) 11/11/2021  ? TRIG 99 11/11/2021  ? CHOLHDL 3.5 11/11/2021  ? ?Lab Results  ?Component Value Date  ? CREATININE 0.90 03/06/2020  ? BUN 1 (A) 05/20/2020  ? NA 145 05/20/2020  ? K 3.2 (A) 05/20/2020  ? CL 102 05/20/2020  ? CO2 34 (A) 05/20/2020  ? ?BP Readings from Last 3 Encounters:  ?11/26/21 132/83  ?11/11/21 130/82  ?10/09/21 139/77  ? ?Pulse Readings from Last 3 Encounters:  ?11/26/21 94  ?11/11/21 (!) 58  ?10/09/21 (!) 57  ? ? ?SDOH:  (Social Determinants of Health) assessments and interventions performed:  ? ? ?CCM Care Plan ? ?  Review of patient past medical history, allergies, medications, health status, including review of consultants reports, laboratory and other test data, was performed as part of comprehensive evaluation and provision of chronic care management services.  ? ?Care Plan : PharmD - medication management  ?Updates made by Rennis Petty, RPH-CPP since 11/26/2021 12:00 AM  ?  ? ?Problem: Disease Progression   ?  ? 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 ?Pharmacist Clinical Goal(s):  ?patient will adhere to plan to optimize therapeutic regimen as evidenced by report of adherence to recommended medication management changes through collaboration with PharmD and provider.  ? ? ?Interventions: ?1:1 collaboration with Grant Fenton, NP  regarding development and update of comprehensive plan of care as evidenced by provider attestation and co-signature ?Inter-disciplinary care team collaboration (see longitudinal plan of care) ?Comprehensiv

## 2021-11-27 ENCOUNTER — Inpatient Hospital Stay
Admission: EM | Admit: 2021-11-27 | Discharge: 2021-11-30 | DRG: 247 | Disposition: A | Discharge status: Home Health | Payer: Medicare Other | Attending: Student | Admitting: Student

## 2021-11-27 ENCOUNTER — Ambulatory Visit
Payer: Worker's Compensation | Attending: Student in an Organized Health Care Education/Training Program | Admitting: Student in an Organized Health Care Education/Training Program

## 2021-11-27 ENCOUNTER — Emergency Department: Payer: Medicare Other

## 2021-11-27 ENCOUNTER — Encounter: Payer: Self-pay | Admitting: Student in an Organized Health Care Education/Training Program

## 2021-11-27 ENCOUNTER — Encounter
Admission: EM | Disposition: A | Discharge status: Home Health | Payer: Self-pay | Source: Home / Self Care | Attending: Student

## 2021-11-27 ENCOUNTER — Other Ambulatory Visit: Payer: Self-pay

## 2021-11-27 ENCOUNTER — Other Ambulatory Visit: Payer: Self-pay | Admitting: Internal Medicine

## 2021-11-27 VITALS — BP 131/87 | HR 57 | Temp 96.6°F | Resp 16 | Ht 69.0 in | Wt 141.0 lb

## 2021-11-27 DIAGNOSIS — M5481 Occipital neuralgia: Secondary | ICD-10-CM

## 2021-11-27 DIAGNOSIS — F32A Depression, unspecified: Secondary | ICD-10-CM | POA: Diagnosis present

## 2021-11-27 DIAGNOSIS — Z818 Family history of other mental and behavioral disorders: Secondary | ICD-10-CM

## 2021-11-27 DIAGNOSIS — I214 Non-ST elevation (NSTEMI) myocardial infarction: Secondary | ICD-10-CM

## 2021-11-27 DIAGNOSIS — G629 Polyneuropathy, unspecified: Secondary | ICD-10-CM | POA: Diagnosis present

## 2021-11-27 DIAGNOSIS — I249 Acute ischemic heart disease, unspecified: Secondary | ICD-10-CM | POA: Diagnosis not present

## 2021-11-27 DIAGNOSIS — K219 Gastro-esophageal reflux disease without esophagitis: Secondary | ICD-10-CM | POA: Diagnosis present

## 2021-11-27 DIAGNOSIS — I5042 Chronic combined systolic (congestive) and diastolic (congestive) heart failure: Secondary | ICD-10-CM | POA: Diagnosis present

## 2021-11-27 DIAGNOSIS — E785 Hyperlipidemia, unspecified: Secondary | ICD-10-CM | POA: Diagnosis not present

## 2021-11-27 DIAGNOSIS — Z823 Family history of stroke: Secondary | ICD-10-CM

## 2021-11-27 DIAGNOSIS — M792 Neuralgia and neuritis, unspecified: Secondary | ICD-10-CM | POA: Diagnosis not present

## 2021-11-27 DIAGNOSIS — Z8249 Family history of ischemic heart disease and other diseases of the circulatory system: Secondary | ICD-10-CM

## 2021-11-27 DIAGNOSIS — Z9689 Presence of other specified functional implants: Secondary | ICD-10-CM | POA: Diagnosis not present

## 2021-11-27 DIAGNOSIS — R0989 Other specified symptoms and signs involving the circulatory and respiratory systems: Secondary | ICD-10-CM | POA: Diagnosis present

## 2021-11-27 DIAGNOSIS — I252 Old myocardial infarction: Secondary | ICD-10-CM

## 2021-11-27 DIAGNOSIS — Z880 Allergy status to penicillin: Secondary | ICD-10-CM

## 2021-11-27 DIAGNOSIS — G43909 Migraine, unspecified, not intractable, without status migrainosus: Secondary | ICD-10-CM | POA: Insufficient documentation

## 2021-11-27 DIAGNOSIS — I472 Ventricular tachycardia, unspecified: Secondary | ICD-10-CM | POA: Diagnosis present

## 2021-11-27 DIAGNOSIS — N3281 Overactive bladder: Secondary | ICD-10-CM | POA: Diagnosis present

## 2021-11-27 DIAGNOSIS — G43709 Chronic migraine without aura, not intractable, without status migrainosus: Secondary | ICD-10-CM | POA: Diagnosis present

## 2021-11-27 DIAGNOSIS — G894 Chronic pain syndrome: Secondary | ICD-10-CM | POA: Diagnosis present

## 2021-11-27 DIAGNOSIS — M47816 Spondylosis without myelopathy or radiculopathy, lumbar region: Secondary | ICD-10-CM

## 2021-11-27 DIAGNOSIS — I10 Essential (primary) hypertension: Secondary | ICD-10-CM

## 2021-11-27 DIAGNOSIS — I25110 Atherosclerotic heart disease of native coronary artery with unstable angina pectoris: Secondary | ICD-10-CM | POA: Diagnosis present

## 2021-11-27 DIAGNOSIS — K589 Irritable bowel syndrome without diarrhea: Secondary | ICD-10-CM | POA: Diagnosis present

## 2021-11-27 DIAGNOSIS — M5416 Radiculopathy, lumbar region: Secondary | ICD-10-CM | POA: Diagnosis present

## 2021-11-27 DIAGNOSIS — I110 Hypertensive heart disease with heart failure: Secondary | ICD-10-CM | POA: Diagnosis present

## 2021-11-27 DIAGNOSIS — G2581 Restless legs syndrome: Secondary | ICD-10-CM | POA: Diagnosis present

## 2021-11-27 DIAGNOSIS — R079 Chest pain, unspecified: Principal | ICD-10-CM

## 2021-11-27 DIAGNOSIS — I493 Ventricular premature depolarization: Secondary | ICD-10-CM | POA: Diagnosis present

## 2021-11-27 DIAGNOSIS — F419 Anxiety disorder, unspecified: Secondary | ICD-10-CM | POA: Diagnosis present

## 2021-11-27 DIAGNOSIS — Z85828 Personal history of other malignant neoplasm of skin: Secondary | ICD-10-CM

## 2021-11-27 DIAGNOSIS — Z888 Allergy status to other drugs, medicaments and biological substances status: Secondary | ICD-10-CM

## 2021-11-27 DIAGNOSIS — Z889 Allergy status to unspecified drugs, medicaments and biological substances status: Secondary | ICD-10-CM

## 2021-11-27 DIAGNOSIS — Z881 Allergy status to other antibiotic agents status: Secondary | ICD-10-CM

## 2021-11-27 DIAGNOSIS — Z79899 Other long term (current) drug therapy: Secondary | ICD-10-CM

## 2021-11-27 DIAGNOSIS — K580 Irritable bowel syndrome with diarrhea: Secondary | ICD-10-CM | POA: Diagnosis present

## 2021-11-27 DIAGNOSIS — E876 Hypokalemia: Secondary | ICD-10-CM | POA: Diagnosis not present

## 2021-11-27 HISTORY — PX: CORONARY/GRAFT ACUTE MI REVASCULARIZATION: CATH118305

## 2021-11-27 HISTORY — PX: LEFT HEART CATH AND CORONARY ANGIOGRAPHY: CATH118249

## 2021-11-27 HISTORY — DX: Non-ST elevation (NSTEMI) myocardial infarction: I21.4

## 2021-11-27 LAB — BASIC METABOLIC PANEL
Anion gap: 13 (ref 5–15)
BUN: 14 mg/dL (ref 8–23)
CO2: 27 mmol/L (ref 22–32)
Calcium: 9.5 mg/dL (ref 8.9–10.3)
Chloride: 98 mmol/L (ref 98–111)
Creatinine, Ser: 0.87 mg/dL (ref 0.61–1.24)
GFR, Estimated: >60 mL/min (ref >60)
Glucose, Bld: 121 mg/dL — ABNORMAL HIGH (ref 70–99)
Potassium: 3.4 mmol/L — ABNORMAL LOW (ref 3.5–5.1)
Sodium: 138 mmol/L (ref 135–145)

## 2021-11-27 LAB — CBC
HCT: 45.7 % (ref 39.0–52.0)
Hemoglobin: 15.1 g/dL (ref 13.0–17.0)
MCH: 29.2 pg (ref 26.0–34.0)
MCHC: 33 g/dL (ref 30.0–36.0)
MCV: 88.2 fL (ref 80.0–100.0)
Platelets: 224 10*3/uL (ref 150–400)
RBC: 5.18 MIL/uL (ref 4.22–5.81)
RDW: 12.9 % (ref 11.5–15.5)
WBC: 8.9 10*3/uL (ref 4.0–10.5)
nRBC: 0 % (ref 0.0–0.2)

## 2021-11-27 LAB — TROPONIN I (HIGH SENSITIVITY)
Troponin I (High Sensitivity): 28 ng/L — ABNORMAL HIGH (ref <18)
Troponin I (High Sensitivity): 167 ng/L (ref <18)

## 2021-11-27 SURGERY — CORONARY/GRAFT ACUTE MI REVASCULARIZATION
Anesthesia: Moderate Sedation

## 2021-11-27 MED ORDER — HYDROCODONE-ACETAMINOPHEN 10-325 MG PO TABS: 1.0000 | ORAL_TABLET | Freq: Three times a day (TID) | ORAL | 0 refills | Status: DC | PRN

## 2021-11-27 MED ORDER — ASPIRIN 81 MG PO CHEW
162.0000 mg | CHEWABLE_TABLET | Freq: Once | ORAL | Status: AC
  Administered 2021-11-27: 162 mg via ORAL
  Filled 2021-11-27: qty 2

## 2021-11-27 MED ORDER — HEPARIN SODIUM (PORCINE) 5000 UNIT/ML IJ SOLN
INTRAMUSCULAR | Status: AC
  Filled 2021-11-27: qty 1

## 2021-11-27 MED ORDER — HEPARIN (PORCINE) 25000 UT/250ML-% IV SOLN
800.0000 [IU]/h | INTRAVENOUS | Status: DC
  Administered 2021-11-27: 800 [IU]/h via INTRAVENOUS
  Filled 2021-11-27: qty 250

## 2021-11-27 MED ORDER — VERAPAMIL HCL 2.5 MG/ML IV SOLN
INTRAVENOUS | Status: AC
  Filled 2021-11-27: qty 2

## 2021-11-27 MED ORDER — LIDOCAINE HCL 1 % IJ SOLN
INTRAMUSCULAR | Status: AC
  Filled 2021-11-27: qty 20

## 2021-11-27 MED ORDER — HYDROCODONE-ACETAMINOPHEN 10-325 MG PO TABS: 1.0000 | ORAL_TABLET | Freq: Three times a day (TID) | ORAL | 0 refills | Status: AC | PRN

## 2021-11-27 MED ORDER — NITROGLYCERIN 0.4 MG SL SUBL
0.4000 mg | SUBLINGUAL_TABLET | SUBLINGUAL | Status: DC | PRN
  Administered 2021-11-27: 0.4 mg via SUBLINGUAL
  Filled 2021-11-27: qty 1

## 2021-11-27 MED ORDER — HEPARIN (PORCINE) IN NACL 1000-0.9 UT/500ML-% IV SOLN
INTRAVENOUS | Status: AC
Start: 2021-11-27 — End: ?
  Filled 2021-11-27: qty 1000

## 2021-11-27 MED ORDER — HEPARIN BOLUS VIA INFUSION
4000.0000 [IU] | Freq: Once | INTRAVENOUS | Status: AC
  Administered 2021-11-27: 4000 [IU] via INTRAVENOUS
  Filled 2021-11-27: qty 4000

## 2021-11-27 SURGICAL SUPPLY — 22 items
BALLN MINITREK RX 2.0X12 (BALLOONS) ×2
BALLN MINITREK RX 2.0X20 (BALLOONS) ×2
BALLOON MINITREK RX 2.0X12 (BALLOONS) IMPLANT
BALLOON MINITREK RX 2.0X20 (BALLOONS) IMPLANT
CATH 5F 110X4 TIG (CATHETERS) ×1 IMPLANT
CATH INFINITI 5FR ANG PIGTAIL (CATHETERS) ×1 IMPLANT
CATH VISTA GUIDE 6FR XB3.5 (CATHETERS) ×1 IMPLANT
DEVICE RAD TR BAND REGULAR (VASCULAR PRODUCTS) ×1 IMPLANT
DRAPE BRACHIAL (DRAPES) ×1 IMPLANT
GLIDESHEATH SLEND SS 6F .021 (SHEATH) ×1 IMPLANT
GUIDEWIRE INQWIRE 1.5J.035X260 (WIRE) IMPLANT
INQWIRE 1.5J .035X260CM (WIRE) ×2
KIT ENCORE 26 ADVANTAGE (KITS) ×1 IMPLANT
PACK CARDIAC CATH (CUSTOM PROCEDURE TRAY) ×2 IMPLANT
PROTECTION STATION PRESSURIZED (MISCELLANEOUS) ×2
SET ATX SIMPLICITY (MISCELLANEOUS) ×1 IMPLANT
STATION PROTECTION PRESSURIZED (MISCELLANEOUS) IMPLANT
STENT ONYX FRONTIER 2.5X26 (Permanent Stent) ×1 IMPLANT
STENT ONYX FRONTIER 2.5X34 (Permanent Stent) ×1 IMPLANT
TUBING CIL FLEX 10 FLL-RA (TUBING) ×1 IMPLANT
WIRE ASAHI PROWATER 180CM (WIRE) ×1 IMPLANT
WIRE RUNTHROUGH .014X180CM (WIRE) ×1 IMPLANT

## 2021-11-27 NOTE — ED Notes (Signed)
Called [facility] to activate Code Stemi ?

## 2021-11-27 NOTE — ED Notes (Signed)
Dr. Goodman at bedside.  

## 2021-11-27 NOTE — Progress Notes (Signed)
PROVIDER NOTE: Information contained herein reflects review and annotations entered in association with encounter. Interpretation of such information and data should be left to medically-trained personnel. Information provided to patient can be located elsewhere in the medical record under "Patient Instructions". Document created using STT-dictation technology, any transcriptional errors that may result from process are unintentional.  ?  ?Patient: Grant Young  Service Category: E/M  Provider: [name], MD  ?DOB: June 01, 1946  DOS: 11/27/2021  Specialty: Interventional Pain Management  ?MRN: [id number]  Setting: Ambulatory outpatient  PCP: [name], NP  ?Type: Established Patient    Referring Provider: [name], NP  ?Location: Office  Delivery: Face-to-face    ? ?HPI  ?Grant Young, a 76 y.o. year old male, is here today because of his Lumbar facet arthropathy [M47.816]. Grant Young primary complain today is Back Pain (low) ?Last encounter: My last encounter with him was on 10/16/2021. 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 ?Pain Assessment: Severity of Chronic pain is reported as a 3 /10. Location: Back Lower/denies. Onset: More than a month ago. Quality: Aching, Sharp. Timing: Constant. Modifying factor(s): meds, procedures. ?Vitals:  height is '5\' 9"'$  (1.753 m) and weight is 141 lb (64 kg). His temperature is 96.6 ?F (35.9 ?C) (abnormal). His blood pressure is 131/87 and his pulse is 57 (abnormal). His respiration is 16 and oxygen saturation is 100%.  ? ?Reason for encounter: medication management.  ? ?Grant Young follows up today for medication management.  At his last clinic visit, he was transition from short acting hydrocodone to extended release hydrocodone at 10 mg twice a day.  He states that the  extended release hydrocodone is not effective in managing his overall pain and that he is noticing side effects of confusion, cognitive dysfunction and fatigue.  We discussed transitioning back to immediate release hydrocodone as before. ? ?Pharmacotherapy Assessment  ?Analgesic: Hydrocodone ER 10 mg BID PRN--> Hydrocodone IR 10 mg TID prn   ? ?Monitoring: ?[place] PMP: PDMP reviewed during this encounter.       ?Pharmacotherapy: No side-effects or adverse reactions reported. ?Compliance: No problems identified. ?Effectiveness: Clinically acceptable. ? ?Janett Billow, RN  11/27/2021 10:15 AM  Sign when Signing Visit ?Hydrocodone ER 10 mg qty 21 prepared to deposit in opioid box at [facility].  Tape placed over cap of bottle, then placed in clear plastic bag with tape secured around bag.  Festus Barren RN will accompany patient over to pharmacy to deposit pills. Pills remained in patient possession until reaching pharmacy.  ? ?Dewayne Shorter, RN  11/27/2021  9:53 AM  Sign when Signing Visit ?Nursing Pain Medication Assessment:  ?Safety precautions to be maintained throughout the outpatient stay will include: orient to surroundings, keep bed in low position, maintain call bell within reach at all times, provide assistance with transfer out of bed and ambulation.  ?Medication Inspection Compliance: Pill count conducted under aseptic conditions, in front of the patient. Neither the pills nor the bottle was removed from the patient's sight at any time. Once count was completed pills were immediately returned to the patient in their original bottle. ? ?Medication:  Hydrocodone ER 10 mg ?Pill/Patch Count:  21 of 60 pills remain ?Pill/Patch Appearance: Markings consistent with prescribed medication ?Bottle Appearance: Standard pharmacy container. Clearly labeled. ?Filled Date: 02 / 23 / 2023 ?Last  Medication intake:  Today ?  ?  UDS:  ?Summary  ?Date Value Ref Range Status  ?09/17/2020 Note  Final  ?  Comment:  ?   ==================================================================== ?Compliance Drug Analysis, Ur ?==================================================================== ?Specimen Alert ?Note: Urinary creatinine is low; ability to detect some drugs may be ?compromised. Interpret results with caution. (Creatinine) ?==================================================================== ?Test                             Result       Flag       Units ? ?Drug Present and Declared for Prescription Verification ?  Lorazepam                      1107         EXPECTED   ng/mg creat ?   Source of lorazepam is a scheduled prescription medication. ? ?  Butalbital                     PRESENT      EXPECTED ?  Citalopram                     PRESENT      EXPECTED ?  Desmethylcitalopram            PRESENT      EXPECTED ?   Desmethylcitalopram is an expected metabolite of citalopram or the ?   enantiomeric form, escitalopram. ? ?  Acetaminophen                  PRESENT      EXPECTED ?  Propranolol                    PRESENT      EXPECTED ? ?Drug Present not Declared for Prescription Verification ?  Oxazepam                       300          UNEXPECTED ng/mg creat ?  Temazepam                      357          UNEXPECTED ng/mg creat ?   Oxazepam and temazepam are expected metabolites of diazepam. ?   Oxazepam is also an expected metabolite of other benzodiazepine ?   drugs, including chlordiazepoxide, prazepam, clorazepate, halazepam, ?   and temazepam.  Oxazepam and temazepam are available as scheduled ?   prescription medications. ? ?Drug Absent but Declared for Prescription Verification ?  Hydrocodone                    Not Detected UNEXPECTED ng/mg creat ?  Tizanidine                     Not Detected UNEXPECTED ?   Tizanidine, as indicated in the declared medication list, is not ?   always detected even when used as directed. ? ?  Amitriptyline                  Not Detected UNEXPECTED ?  Prochlorperazine               Not Detected  UNEXPECTED ?  Salicylate  Not Detected UNEXPECTED ?   Aspirin, as indicated in the declared medication list, is not always ?   detected even when used as directed. ? ?==================================================================== ?Test                      Result    Flag   Units      Ref Range ?  Creatinine              14        LL     mg/dL      >=20 ?==================================================================== ?Declared Medications: ? The flagging and interpretation on this report are based on the ? following declared medications.  Unexpected results may arise from ? inaccuracies in the declared medications. ? ? **Note: The testing scope of this panel includes these medications: ? ? Amitriptyline (Elavil) ? Butalbital (Fioricet) ? Butalbital (Fiorinal) ? Escitalopram (Lexapro) ? Hydrocodone (Norco) ? Lorazepam (Ativan) ? Prochlorperazine (Compazine) ? Propranolol (Inderal) ? ? **Note: The testing scope of this panel does not include small to ? moderate amounts of these reported medications: ? ? Acetaminophen (Fioricet) ? Acetaminophen (Norco) ? Aspirin (Fiorinal) ? Tizanidine (Zanaflex) ? ? **Note: The testing scope of this panel does not include the ? following reported medications: ? ? Anastrozole (Arimidex) ? Caffeine (Fioricet) ? Caffeine (Fiorinal) ? Chlorthalidone (Hygroton) ? Cholecalciferol ? Cholestyramine (Questran) ? Dicyclomine (Bentyl) ? Ezetimibe (Zetia) ? Hydrocortisone ? Melatonin ? Ondansetron (Zofran) ? Potassium (Klor-Con) ? Rabeprazole (Aciphex) ? Sucralfate (Carafate) ? Valacyclovir (Valtrex) ? Vitamin B12 ?==================================================================== ?For clinical consultation, please call [phone]. ?==================================================================== ?  ?  ? ?ROS  ?Constitutional: Denies any fever or chills ?Gastrointestinal: No reported hemesis, hematochezia, vomiting, or acute GI distress ?Musculoskeletal:   Low back pain ?Neurological: No reported episodes of acute onset apraxia, aphasia, dysarthria, agnosia, amnesia, paralysis, loss of coordination, or loss of consciousness ? ?Medication Review  ?Cholecalciferol, D

## 2021-11-27 NOTE — Telephone Encounter (Signed)
Medication Refill - Medication:diazepam (VALIUM) 5 MG tablet  ? ?Has the patient contacted their pharmacy? Tried to get pt to contact pharmacy he said he doesn't normally but he will next time ?(Agent: If no, request that the patient contact the pharmacy for the refill. If patient does not wish to contact the pharmacy document the reason why and proceed with request.) ?(Agent: If yes, when and what did the pharmacy advise?) ? ?Preferred Pharmacy (with phone number or street name): [id number] [place] [facility]?[address]?Phone: [phone]: [phone]?Hours: Not open 24 hours ?Has the patient been seen for an appointment in the last year OR does the patient have an upcoming appointment? yes ? ?Agent: Please be advised that RX refills may take up to 3 business days. We ask that you follow-up with your pharmacy.  ?

## 2021-11-27 NOTE — ED Notes (Signed)
This RN called & spoke with Grant Young in the pharmacy re: heparin bolus dose. He will enter the order so med can be given. ?

## 2021-11-27 NOTE — ED Notes (Signed)
Pt transported to cath lab at this time.

## 2021-11-27 NOTE — ED Provider Notes (Signed)
? ?[facility] ?Provider Note ? ? ? Event Date/Time  ? First MD Initiated Contact with Patient 11/27/21 2203   ?  (approximate) ? ? ?History  ? ?Chest Pain ? ? ?HPI ? ?Grant Young is a 76 y.o. male  who, per clinic note dated 07/07/2021 has history of MI, presents to the emergency department today because of concern for chest pain.  Patient states that the symptoms started roughly at 8 PM this evening.  He did have some chest discomfort earlier in the day when he was doing a cardiac rehab class.  He states that was temporary so he did not think much of it.  However tonight the pain persisted.  Located in his center chest he describes it as pressure.  It did radiate into his left arm and up his neck.  Did have some shortness of breath.  It did remind him of the previous pain he had had with his MIs.  He did take 2 baby aspirin's at home. ? ?  ? ? ?Physical Exam  ? ?Triage Vital Signs: ?ED Triage Vitals  ?Enc Vitals Group  ?   BP 11/27/21 2113 (!) 176/89  ?   Pulse Rate 11/27/21 2113 63  ?   Resp 11/27/21 2113 18  ?   Temp 11/27/21 2113 98 ?F (36.7 ?C)  ?   Temp Source 11/27/21 2113 Oral  ?   SpO2 11/27/21 2103 100 %  ?   Weight 11/27/21 2114 141 lb (64 kg)  ?   Height 11/27/21 2114 '5\' 9"'$  (1.753 m)  ?   Head Circumference --   ?   Peak Flow --   ?   Pain Score 11/27/21 2113 8  ?   Pain Loc --   ?   Pain Edu? --   ?   Excl. in [place]? --   ? ? ?Most recent vital signs: ?Vitals:  ? 11/27/21 2103 11/27/21 2113  ?BP:  (!) 176/89  ?Pulse:  63  ?Resp:  18  ?Temp:  98 ?F (36.7 ?C)  ?SpO2: 100% 100%  ? ? ?General: Awake, no distress.  ?CV:  Good peripheral perfusion. Regular rate and rhythm ?Resp:  Normal effort. Lungs clear to auscultation. ?Abd:  No distention.  ? ?ED Results / Procedures / Treatments  ? ?Labs ?(all labs ordered are listed, but only abnormal results are displayed) ?Labs Reviewed  ?BASIC METABOLIC PANEL - Abnormal; Notable for the following components:  ?    Result Value  ? Potassium 3.4  (*)   ? Glucose, Bld 121 (*)   ? All other components within normal limits  ?TROPONIN I (HIGH SENSITIVITY) - Abnormal; Notable for the following components:  ? Troponin I (High Sensitivity) 28 (*)   ? All other components within normal limits  ?CBC  ? ? ? ?EKG ? ?INance Pear, attending physician, personally viewed and interpreted this EKG ? ?EKG Time: 2117 ?Rate: 53 ?Rhythm: sinus bradycardia ?Axis: normal ?Intervals: qtc 454 ?QRS: narrow ?ST changes: st elevation aVR, depression II, aVF, v3,v4,v5 ?Impression: abnormal ekg ? ?INance Pear, attending physician, personally viewed and interpreted this EKG ? ?EKG Time: 2149 ?Rate: 54 ?Rhythm: sinus bradycardia ?Axis: normal ?Intervals: qtc 474 ?QRS: narrow ?ST changes: st elevation aVR, depression II, III, aVF, v3-v6 ?Impression: abnormal ekg ? ? ?RADIOLOGY ?I independently interpreted and visualized the CXR. My interpretation: No pneumonia. No pneumothorax. ?Radiology interpretation:  ?IMPRESSION:  ?No active cardiopulmonary disease.  ?   ? ? ? ?  PROCEDURES: ? ?Critical Care performed: Yes, see critical care procedure note(s) ? ?Procedures ? ?CRITICAL CARE ?Performed by: Nance Pear ? ? ?Total critical care time: 30 minutes ? ?Critical care time was exclusive of separately billable procedures and treating other patients. ? ?Critical care was necessary to treat or prevent imminent or life-threatening deterioration. ? ?Critical care was time spent personally by me on the following activities: development of treatment plan with patient and/or surrogate as well as nursing, discussions with consultants, evaluation of patient's response to treatment, examination of patient, obtaining history from patient or surrogate, ordering and performing treatments and interventions, ordering and review of laboratory studies, ordering and review of radiographic studies, pulse oximetry and re-evaluation of patient's condition. ? ? ? ?MEDICATIONS ORDERED IN ED: ?Medications  - No data to display ? ? ?IMPRESSION / MDM / ASSESSMENT AND PLAN / ED COURSE  ?I reviewed the triage vital signs and the nursing notes. ?             ?               ? ?Differential diagnosis includes, but is not limited to, ACS, PE, dissection, pneumonia, pneumothorax, GERD. ? ?Patient presented to the emergency department today with central pressure-like chest pain that started this evening.  Patient does have a history of MI.  Patient's initial EKG was concerning for some elevation in aVR and some depression.  On repeat the elevation and depressions were more pronounced.  While this would not technically meet STEMI criteria given only elevation in aVR did discuss with Dr. Ellyn Hack with cardiology.  He did recommend giving heparin bolus.  He came and evaluated the patient.  Given that patient continued with pain the decision was made to take him emergently to the catheterization lab. ? ? ?FINAL CLINICAL IMPRESSION(S) / ED DIAGNOSES  ? ?Final diagnoses:  ?Chest pain, unspecified type  ? ? ? ? ? ? ?Note:  This document was prepared using Dragon voice recognition software and may include unintentional dictation errors. ? ?  ?Nance Pear, MD ?11/27/21 2347 ? ?

## 2021-11-27 NOTE — Consult Note (Signed)
ANTICOAGULATION CONSULT NOTE - Follow Up Consult ? ?Pharmacy Consult for Heparin gtt ?Indication: chest pain/ACS ? ?Allergies  ?Allergen Reactions  ? Ace Inhibitors   ?  Other reaction(s): Cough  ? Fluoxetine Anxiety  ?  "made me fall asleep" per pt ?"bad headaches and "makes  Me  Crazy" ?historical allergy noted in [employer] ?"made me fall asleep" per pt ?"bad headaches and "makes  Me  Crazy" ?Per New Patient Packet.  ?  ? Metoclopramide   ?  Other reaction(s): Other (See Comments), Other (See Comments), Unknown ?Tardive Dyskinesia  ?historical allergy noted in [employer] ?Tardive Dyskinesia  ?Per New Patient Packet.  ? Nalbuphine   ?  Used Post Back surgery- Anesthesiologist Error. Patient had Narcotic Withdraw. Per New Patient Packet.   ? Other   ?  Other reaction(s): Other (See Comments) ?Altered mental status in combo with narcotics at previous hospitalization - Full Withdrawal Symptoms ?Other reaction(s): Rash  ? Amoxicillin-Pot Clavulanate Nausea Only  ?  Per New Patient Packet.  ? Doxazosin Rash  ?  Other reaction(s): Other - See Comments, Rash ?UNKNOWN REACTION ?UNKNOWN REACTION ?  ? Duloxetine Nausea Only  ?  Per New Patient Packet.   ? Penicillins Nausea Only  ?  Per New Patient Packet.  ? Tamsulosin Itching and Anxiety  ?  Restless, Flushing, Heavy Chest, Itching, Hyperactive mood and Anxiety. Unable to handle side effects. Per New Patient Packet.  ?  ? Trazodone And Nefazodone Itching, Anxiety and Rash  ?  Headache. ?"INCREASED MY ANXIETY AND HEARTRATE" ?Flushing, tachycardia ?"INCREASED MY ANXIETY AND HEARTRATE" ?Per New Patient Packet. ?  ? Amlodipine   ?  Shaking, unsure of reaction. Per New Patient Packet.  ?  ? Cinoxacin   ?  GI Intolerance, and Dizziness. Per New Patient Packet.   ? Ciprofloxacin   ?  Other reaction(s): Unknown  ? Fludrocortisone Other (See Comments)  ?  "Worsening headaches, GI issues, fatigue"  ? Nebivolol   ?  Other reaction(s): Unknown  ? Olanzapine   ?  Headache and unable to  sleep for 3 nights. Per New Patient Packet. ?  ? Olmesartan   ?  Other reaction(s): Unknown  ? Pregabalin   ?  Confusion, Lack of concentration, dizziness, and likely drowsiness. Per New Patient Packet.  ?  ? Prostaglandins   ?  Other reaction(s): Other (See Comments) ?Intolerance  ? Thyroid Hormones   ?  Other reaction(s): Other (See Comments) ?Thyroid (Nature Thyroid) contraindicated with some of your other medications.  ? Zolpidem   ?  Nightmares, Ineffective after 2 days. Per New Patient Packet.   ? Duloxetine Hcl   ?  Other reaction(s): Rash  ? Fluoxetine Hcl   ?  Other reaction(s): Rash  ? Nucynta [Tapentadol] Other (See Comments)  ?  Vertigo   ? Phenytoin Anxiety  ?  Hyperactivity, and Ineffective. Per New Patient Packet.   ? ? ?Patient Measurements: ?Height: '5\' 9"'$  (175.3 cm) ?Weight: 64 kg (141 lb) ?IBW/kg (Calculated) : 70.7 ?Heparin Dosing Weight: 64kg ? ?Vital Signs: ?Temp: 98 ?F (36.7 ?C) (03/16 2113) ?Temp Source: Oral (03/16 2113) ?BP: 151/91 (03/16 2230) ?Pulse Rate: 55 (03/16 2230) ? ?Labs: ?Recent Labs  ?  11/27/21 ?2116  ?HGB 15.1  ?HCT 45.7  ?PLT 224  ?CREATININE 0.87  ?TROPONINIHS 28*  ? ? ?Estimated Creatinine Clearance: 68.5 mL/min (by C-G formula based on SCr of 0.87 mg/dL). ? ? ?Medications:  ?PTA: no pertinent AC/APT ?Inpatient: heparin gtt ? ?Assessment: ?76yo male  w/ h/o MI presenting to ED with c/o chest pain. Not on AC/APT PTA per chart reivew. Pharmacy consulted for mgmt of heparin gtt. ? ?Date Time aPTT/HL Rate/Comment ?     ? ?Baseline Labs: ?aPTT - sent ?INR - sent ?Hgb - 15.1 ?Plts - 224 ? ?Goal of Therapy:  ?Heparin level 0.3-0.7 units/ml ?Monitor platelets by anticoagulation protocol: Yes ?  ?Plan:  ?Give 4000 units bolus x1; then start heparin infusion at 800 units/hr ?Check anti-Xa level in 8 hours and daily once consecutively therapeutic. ?Continue to monitor H&H and platelets daily while on heparin gtt. ? ?Lorna Dibble, PharmD, BCCP ?Clinical Pharmacist ?11/27/2021 10:46  PM ? ? ? ?

## 2021-11-27 NOTE — Progress Notes (Signed)
Nursing Pain Medication Assessment:  ?Safety precautions to be maintained throughout the outpatient stay will include: orient to surroundings, keep bed in low position, maintain call bell within reach at all times, provide assistance with transfer out of bed and ambulation.  ?Medication Inspection Compliance: Pill count conducted under aseptic conditions, in front of the patient. Neither the pills nor the bottle was removed from the patient's sight at any time. Once count was completed pills were immediately returned to the patient in their original bottle. ? ?Medication:  Hydrocodone ER 10 mg ?Pill/Patch Count:  21 of 60 pills remain ?Pill/Patch Appearance: Markings consistent with prescribed medication ?Bottle Appearance: Standard pharmacy container. Clearly labeled. ?Filled Date: 02 / 23 / 2023 ?Last Medication intake:  Today ?

## 2021-11-27 NOTE — ED Triage Notes (Signed)
Pt presents via POV with complaints of Chest pain that radiates to his left arm starting 1 hour ago. PTA pt took '324mg'$  ASA which helped improve his pain from a 8/10 to 7/10. Denies SOB.  ?

## 2021-11-27 NOTE — ED Triage Notes (Signed)
EMS brings pt in from home for c/o CP radiating into left arm that began at [place] by [facility] and jaw pain; hx MI and orthostatic BP ?

## 2021-11-27 NOTE — Consult Note (Signed)
?Cardiology Consultation:  ? ?Patient ID: Grant Young ?MRN: [id number]; DOB: 1946-03-12 ? ?Admit date: 11/27/2021 ?Date of Consult: 11/27/2021 ? ?PCP:  Grant Fenton, NP ?  ?[facility] Providers ?Cardiologist:  Kate Sable, MD   { ? ?Patient Profile:  ? ?Grant Young is a 76 y.o. male with a hx of MI x2 in 2 in 1999 with PTCA only [place] along with CRF's of HTN, HLD who is being seen 11/27/2021 for the evaluation of chest pain with abnormal EKG/ACS at the request of Dr. Archie Balboa ([place]). ? ?History of Present Illness:  ? ?Mr. Rappaport has been relatively stable since his MI back in 1998/1999, was most recently seen by Dr. Garen Lah in April 2021.  His most recently evaluated with a Myoview stress test in 2017 which was nonischemic and relatively normal echocardiogram as well. ? ?He has been doing relatively well with no major issues until roughly 8 PM this evening of 11/27/2021 when he developed severe 9/10 out of 10 substernal chest discomfort radiating to his jaw and left arm.  Is been persistent.  He presented to [facility] emergency room where is found to have ST elevations in aVR with diffuse anterolateral ST depressions concerning for possible left main CAD.  He has had ongoing chest pain despite receiving aspirin, nitroglycerin and heparin. ?EKG does not meet criteria for ST elevation MI, however does meet criteria for significant ACS with ongoing chest pain. ?Upon my interview, 10 minutes after receiving nitroglycerin his pain did reduced from 8/10 down to 5/10, but still persists and is still his left arm.  His breathing has improved. ? ?He denies any nausea or significant dyspnea.  No worsening PND orthopnea.  No edema.  He has not noted any rapid irregular heartbeats or palpitations.  No syncope or near syncope. ? ?Past Medical History:  ?Diagnosis Date  ? Anxiety   ? Per New Patient Packet  ? Chronic back pain   ? Per New Patient Packet  ? Chronic heart disease   ? Per New Patient  Packet  ? Depression   ? GERD (gastroesophageal reflux disease)   ? Headache   ? History of CT scan of brain 09/14/2018  ? Per New Patient Packet  ? History of depression   ? Per New Patient Packet  ? History of gastritis   ? Per New Patient Packet  ? History of headache   ? Per New Patient Packet  ? History of kidney stones   ? Per New Patient Packet  ? History of neuropathy   ? Per New Patient Packet  ? Hypertension   ? Per New Patient Packet  ? Insomnia   ? Kidney stone   ? Lumbar radicular pain   ? Per New Patient Packet  ? Lyme disease   ? Per New Patient Packet  ? Myocardial infarction [facility])   ? Overactive bladder   ? PONV (postoperative nausea and vomiting)   ? Skin cancer, basal cell   ? Sleep trouble   ? Per New Patient Packet  ? Spinal cord stimulator status   ? 01/08/21 - not currently using.  ? Squamous cell skin cancer   ? Substance abuse ([place])   ? ?-->  ?He has a history of CAD/MI in [place] back in 1998.  He underwent balloon angioplasty without any stenting because lesion was at the bifurcation.  The following year in 1999, he had another MI and underwent another balloon angioplasty.  He  states feeling well since then.  ? ?Past Surgical History:  ?Procedure Laterality Date  ? BACK SURGERY    ? CARDIAC CATHETERIZATION    ? CATARACT EXTRACTION W/PHACO Left 01/14/2021  ? Procedure: CATARACT EXTRACTION PHACO AND INTRAOCULAR LENS PLACEMENT ([place]) LEFT VIVITY TORIC LENS 8.75 00:56.7;  Surgeon: Birder Robson, MD;  Location: [facility];  Service: Ophthalmology;  Laterality: Left;  ? CATARACT EXTRACTION W/PHACO Right 01/28/2021  ? Procedure: CATARACT EXTRACTION PHACO AND INTRAOCULAR LENS PLACEMENT ([place]) RIGHT VIVITY TORIC LENS;  Surgeon: Birder Robson, MD;  Location: [facility];  Service: Ophthalmology;  Laterality: Right;  6.54 ?00:46.4  ? CHOLECYSTECTOMY  2017  ? COLONOSCOPY  09/15/2015  ? Per New Patient Packet  ? COLONOSCOPY WITH PROPOFOL N/A 10/03/2020  ? Procedure:  COLONOSCOPY WITH PROPOFOL;  Surgeon: [name], MD;  Location[facility] ENDOSCOPY;  Service: Gastroenterology;  Laterality: N/A;  ? ESOPHAGOGASTRODUODENOSCOPY (EGD) WITH PROPOFOL N/A 10/03/2020  ? Procedure: ESOPHAGOGASTRODUODENOSCOPY (EGD) WITH PROPOFOL;  Surgeon: [name], MD;  Location: [facility] ENDOSCOPY;  Service: Gastroenterology;  Laterality: N/A;  ? FOOT SURGERY Bilateral 03/18/2020  ? FOOT SURGERY  08/032021  ? GALLBLADDER SURGERY  09/15/2015  ? Gallbladder Removal. Procedure done by Dr.Beverly. Per New Patient Packet  ? KIDNEY STONE SURGERY  09/14/1980  ? Too many to count. Per New Patient Packet 09/14/1980-09/15/1995  ? KIDNEY STONE SURGERY  09/15/1995  ? Too many to count. Per New Patient Packet  ? LITHOTRIPSY  09/14/2014  ? Per New Patient Packet  ? LITHOTRIPSY  09/14/1996  ? No Stints Used. Per New Patient Packet  ? LITHOTRIPSY  09/14/1997  ? No Stints used. Per New Patient Packet  ? PAIN PUMP IMPLANTATION    ? PAIN PUMP REMOVAL    ? SHOULDER SURGERY Right 1984  ? SIGMOIDOSCOPY  09/14/2017  ? Per New Patient Packet  ? SPINAL CORD STIMULATOR IMPLANT    ? TONSILLECTOMY    ? UPPER GI ENDOSCOPY    ?  ? ?Home Medications:  ?Prior to Admission medications   ?Medication Sig Start Date End Date Taking? Authorizing Provider  ?chlorthalidone (HYGROTON) 25 MG tablet TAKE 1 TABLET (25 MG TOTAL) BY MOUTH DAILY. 10/15/21  Yes Grant Fenton, NP  ?diazepam (VALIUM) 5 MG tablet 5 mg PO in am and 2 tabs PO QHS 10/29/21  Yes [name], [name], NP  ?escitalopram (LEXAPRO) 20 MG tablet TAKE 1 TABLET BY MOUTH EVERY DAY 10/01/21  Yes Grant Fenton, NP  ?propranolol (INDERAL) 40 MG tablet Take 1 tablet (40 mg total) by mouth 2 (two) times daily. 11/13/21  Yes Grant Fenton, NP  ?pyridostigmine (MESTINON) 60 MG tablet Take 1 tablet (60 mg total) by mouth 2 (two) times daily. ?Patient taking differently: Take 30 mg by mouth 2 (two) times daily. 10/15/21  Yes Grant Fenton, NP  ?RABEprazole (ACIPHEX) 20 MG tablet TAKE 1 TABLET BY  MOUTH TWICE A DAY 09/29/21  Yes [name], [name], NP  ?testosterone cypionate (DEPOTESTOSTERONE CYPIONATE) 200 MG/ML injection Inject 80 mg into the muscle every 14 (fourteen) days. 11/27/20  Yes [provider]  ?Vibegron (GEMTESA) 75 MG TABS Take 75 mg by mouth daily. 01/08/21  Yes Vaillancourt, Aldona Bar, PA-C  ?amitriptyline (ELAVIL) 10 MG tablet TAKE 3 TABLETS (30 MG TOTAL) BY MOUTH AT BEDTIME. ?Patient taking differently: Take 10 mg by mouth at bedtime. 09/29/21   Grant Fenton, NP  ?anastrozole (ARIMIDEX) 1 MG tablet 1/2 tablet (0.5 mg) by mouth once weekly 05/07/21  [provider]  ?butalbital-acetaminophen-caffeine (FIORICET) 50-325-40 MG tablet Take 1 tablet by mouth every 6 (six) hours as needed for headache. 09/13/20   [provider]  ?cetirizine (ZYRTEC) 10 MG tablet Take 10 mg by mouth daily as needed for allergies.    [provider]  ?Cholecalciferol 50 MCG (2000 UT) CAPS Take 1 tablet by mouth daily. 04/15/20   [provider]  ?cholestyramine (QUESTRAN) 4 g packet Take 1 packet (4 g total) by mouth 3 (three) times daily. ?Patient taking differently: Take 4 g by mouth daily as needed. 10/16/20 11/27/22  [name], MD  ?Cyanocobalamin (VITAMIN B-12) 5000 MCG LOZG Take 1 lozenge by mouth daily.     [provider]  ?DHEA 25 MG CAPS Take 25 mg by mouth daily.    [provider]  ?dicyclomine (BENTYL) 20 MG tablet Take 20 mg by mouth as needed. 07/20/20   [provider]  ?Digestive Aids Mixture (DIGESTION GB PO) Take 1 capsule by mouth daily.    [provider]  ?ezetimibe (ZETIA) 10 MG tablet Take 1 tablet (10 mg total) by mouth daily. 06/04/20   [name], NP  ?HYDROcodone Bitartrate ER 10 MG CP12 Take 10 mg by mouth every 12 (twelve) hours. Must last 30 days. 11/08/21 12/08/21  [name], MD  ?HYDROcodone-acetaminophen (NORCO) 10-325 MG tablet Take 1 tablet by mouth every 8 (eight) hours as needed for severe pain.  Must last 30 days. 11/27/21 12/27/21  [name], MD  ?HYDROcodone-acetaminophen (NORCO) 10-325 MG tablet Take 1 tablet by mouth every 8 (eight) hours as needed for severe pain. Must last 30 days. 12/27/21 01/26/22  Late

## 2021-11-27 NOTE — Progress Notes (Signed)
Hydrocodone ER 10 mg qty 21 prepared to deposit in opioid box at [facility].  Tape placed over cap of bottle, then placed in clear plastic bag with tape secured around bag.  Festus Barren RN will accompany patient over to pharmacy to deposit pills. Pills remained in patient possession until reaching pharmacy.  ?

## 2021-11-27 NOTE — ED Notes (Signed)
Waiting for STEMI team to be ready per [facility]. ?

## 2021-11-27 NOTE — ED Notes (Addendum)
Pt & wife would like an update from Dr. Archie Balboa. Secure msg sent to Dr. Archie Balboa relaying this info. ?

## 2021-11-27 NOTE — Telephone Encounter (Signed)
Requested medication (s) are due for refill today: yes ? ?Requested medication (s) are on the active medication list: yes ? ?Last refill:  10/29/21 #90/0 ? ?Future visit scheduled: yes ? ?Notes to clinic:  Unable to refill per protocol, cannot delegate. ? ? ? ?  ?Requested Prescriptions  ?Pending Prescriptions Disp Refills  ? diazepam (VALIUM) 5 MG tablet 90 tablet 0  ?  Sig: 5 mg PO in am and 2 tabs PO QHS  ?  ? Not Delegated - Psychiatry: Anxiolytics/Hypnotics 2 Failed - 11/27/2021  3:55 PM  ?  ?  Failed - This refill cannot be delegated  ?  ?  Failed - Urine Drug Screen completed in last 360 days  ?  ?  Passed - Patient is not pregnant  ?  ?  Passed - Valid encounter within last 6 months  ?  Recent Outpatient Visits   ? ?      ? 2 weeks ago Excessive daytime sleepiness  ? [facility] [place], [place] W, NP  ? 2 months ago Anxiety and depression  ? [facility] [place], [place] W, NP  ? 4 months ago Dizziness  ? [facility] [place], [place] W, NP  ? 7 months ago HTN (hypertension), benign  ? [facility] [place], [place] W, NP  ? 9 months ago Essential hypertension  ? [facility] [place], [name], NP  ? ?  ?  ?Future Appointments   ? ?        ? In 1 week Diamant[facility] [place], Herbert Seta, MD [place]  ? ?  ? ?  ?  ?  ? ?

## 2021-11-28 ENCOUNTER — Other Ambulatory Visit (HOSPITAL_COMMUNITY): Payer: Self-pay

## 2021-11-28 ENCOUNTER — Other Ambulatory Visit: Payer: Self-pay

## 2021-11-28 ENCOUNTER — Inpatient Hospital Stay (HOSPITAL_COMMUNITY)
Admit: 2021-11-28 | Discharge: 2021-11-28 | Disposition: A | Discharge status: Home / Self Care | Payer: Medicare Other | Attending: Cardiology | Admitting: Cardiology

## 2021-11-28 ENCOUNTER — Encounter: Payer: Self-pay | Admitting: Internal Medicine

## 2021-11-28 DIAGNOSIS — R079 Chest pain, unspecified: Secondary | ICD-10-CM | POA: Diagnosis present

## 2021-11-28 DIAGNOSIS — K580 Irritable bowel syndrome with diarrhea: Secondary | ICD-10-CM | POA: Diagnosis present

## 2021-11-28 DIAGNOSIS — F32A Depression, unspecified: Secondary | ICD-10-CM | POA: Diagnosis present

## 2021-11-28 DIAGNOSIS — K219 Gastro-esophageal reflux disease without esophagitis: Secondary | ICD-10-CM | POA: Diagnosis present

## 2021-11-28 DIAGNOSIS — Z881 Allergy status to other antibiotic agents status: Secondary | ICD-10-CM | POA: Diagnosis not present

## 2021-11-28 DIAGNOSIS — I248 Other forms of acute ischemic heart disease: Secondary | ICD-10-CM

## 2021-11-28 DIAGNOSIS — I2510 Atherosclerotic heart disease of native coronary artery without angina pectoris: Secondary | ICD-10-CM | POA: Diagnosis not present

## 2021-11-28 DIAGNOSIS — I25110 Atherosclerotic heart disease of native coronary artery with unstable angina pectoris: Secondary | ICD-10-CM | POA: Diagnosis present

## 2021-11-28 DIAGNOSIS — G43709 Chronic migraine without aura, not intractable, without status migrainosus: Secondary | ICD-10-CM | POA: Diagnosis present

## 2021-11-28 DIAGNOSIS — G629 Polyneuropathy, unspecified: Secondary | ICD-10-CM | POA: Diagnosis present

## 2021-11-28 DIAGNOSIS — Z880 Allergy status to penicillin: Secondary | ICD-10-CM | POA: Diagnosis not present

## 2021-11-28 DIAGNOSIS — Z888 Allergy status to other drugs, medicaments and biological substances status: Secondary | ICD-10-CM | POA: Diagnosis not present

## 2021-11-28 DIAGNOSIS — I249 Acute ischemic heart disease, unspecified: Secondary | ICD-10-CM | POA: Diagnosis present

## 2021-11-28 DIAGNOSIS — G2581 Restless legs syndrome: Secondary | ICD-10-CM | POA: Diagnosis present

## 2021-11-28 DIAGNOSIS — I214 Non-ST elevation (NSTEMI) myocardial infarction: Secondary | ICD-10-CM | POA: Diagnosis present

## 2021-11-28 DIAGNOSIS — Z889 Allergy status to unspecified drugs, medicaments and biological substances status: Secondary | ICD-10-CM

## 2021-11-28 DIAGNOSIS — Z85828 Personal history of other malignant neoplasm of skin: Secondary | ICD-10-CM | POA: Diagnosis not present

## 2021-11-28 DIAGNOSIS — I493 Ventricular premature depolarization: Secondary | ICD-10-CM | POA: Diagnosis present

## 2021-11-28 DIAGNOSIS — I110 Hypertensive heart disease with heart failure: Secondary | ICD-10-CM | POA: Diagnosis present

## 2021-11-28 DIAGNOSIS — N3281 Overactive bladder: Secondary | ICD-10-CM | POA: Diagnosis present

## 2021-11-28 DIAGNOSIS — M5416 Radiculopathy, lumbar region: Secondary | ICD-10-CM | POA: Diagnosis present

## 2021-11-28 DIAGNOSIS — E785 Hyperlipidemia, unspecified: Secondary | ICD-10-CM | POA: Diagnosis present

## 2021-11-28 DIAGNOSIS — F419 Anxiety disorder, unspecified: Secondary | ICD-10-CM | POA: Diagnosis present

## 2021-11-28 DIAGNOSIS — E876 Hypokalemia: Secondary | ICD-10-CM | POA: Diagnosis not present

## 2021-11-28 DIAGNOSIS — I472 Ventricular tachycardia, unspecified: Secondary | ICD-10-CM | POA: Diagnosis present

## 2021-11-28 DIAGNOSIS — G894 Chronic pain syndrome: Secondary | ICD-10-CM | POA: Diagnosis present

## 2021-11-28 DIAGNOSIS — I252 Old myocardial infarction: Secondary | ICD-10-CM | POA: Diagnosis not present

## 2021-11-28 DIAGNOSIS — I5042 Chronic combined systolic (congestive) and diastolic (congestive) heart failure: Secondary | ICD-10-CM | POA: Diagnosis present

## 2021-11-28 HISTORY — PX: TRANSTHORACIC ECHOCARDIOGRAM: SHX275

## 2021-11-28 LAB — CBC
HCT: 44.1 % (ref 39.0–52.0)
Hemoglobin: 14.7 g/dL (ref 13.0–17.0)
MCH: 29 pg (ref 26.0–34.0)
MCHC: 33.3 g/dL (ref 30.0–36.0)
MCV: 87 fL (ref 80.0–100.0)
Platelets: 213 10*3/uL (ref 150–400)
RBC: 5.07 MIL/uL (ref 4.22–5.81)
RDW: 12.9 % (ref 11.5–15.5)
WBC: 8.2 10*3/uL (ref 4.0–10.5)
nRBC: 0 % (ref 0.0–0.2)

## 2021-11-28 LAB — ECHOCARDIOGRAM COMPLETE
AR max vel: 2.86 cm2
AV Area VTI: 3.44 cm2
AV Area mean vel: 2.97 cm2
AV Mean grad: 1.5 mmHg
AV Peak grad: 2.5 mmHg
Ao pk vel: 0.79 m/s
Area-P 1/2: 2.48 cm2
Height: 69 in
MV VTI: 1.69 cm2
P 1/2 time: 612 msec
S' Lateral: 2.5 cm
Weight: 2239.87 oz

## 2021-11-28 LAB — BASIC METABOLIC PANEL
Anion gap: 7 (ref 5–15)
BUN: 14 mg/dL (ref 8–23)
CO2: 31 mmol/L (ref 22–32)
Calcium: 9 mg/dL (ref 8.9–10.3)
Chloride: 102 mmol/L (ref 98–111)
Creatinine, Ser: 0.87 mg/dL (ref 0.61–1.24)
GFR, Estimated: >60 mL/min (ref >60)
Glucose, Bld: 117 mg/dL — ABNORMAL HIGH (ref 70–99)
Potassium: 3.2 mmol/L — ABNORMAL LOW (ref 3.5–5.1)
Sodium: 140 mmol/L (ref 135–145)

## 2021-11-28 LAB — POCT ACTIVATED CLOTTING TIME
Activated Clotting Time: 251 seconds
Activated Clotting Time: 263 seconds
Activated Clotting Time: 311 seconds

## 2021-11-28 LAB — APTT: aPTT: >200 seconds (ref 24–36)

## 2021-11-28 LAB — PROTIME-INR
INR: 1.1 (ref 0.8–1.2)
Prothrombin Time: 14.3 seconds (ref 11.4–15.2)

## 2021-11-28 LAB — MAGNESIUM: Magnesium: 2 mg/dL (ref 1.7–2.4)

## 2021-11-28 LAB — MRSA NEXT GEN BY PCR, NASAL: MRSA by PCR Next Gen: NOT DETECTED

## 2021-11-28 MED ORDER — ONDANSETRON HCL 4 MG/2ML IJ SOLN: 4.0000 mg | Freq: Four times a day (QID) | INTRAMUSCULAR | Status: DC | PRN

## 2021-11-28 MED ORDER — SUCRALFATE 1 G PO TABS
1.0000 g | ORAL_TABLET | Freq: Three times a day (TID) | ORAL | Status: DC
  Administered 2021-11-28 – 2021-11-30 (×9): 1 g via ORAL
  Filled 2021-11-28 (×10): qty 1

## 2021-11-28 MED ORDER — SODIUM CHLORIDE 0.9% FLUSH: 3.0000 mL | INTRAVENOUS | Status: DC | PRN

## 2021-11-28 MED ORDER — HEPARIN (PORCINE) IN NACL 1000-0.9 UT/500ML-% IV SOLN
INTRAVENOUS | Status: DC | PRN
  Administered 2021-11-28 (×2): 500 mL

## 2021-11-28 MED ORDER — POLYETHYLENE GLYCOL 3350 17 G PO PACK
17.0000 g | PACK | Freq: Every day | ORAL | Status: DC | PRN
  Filled 2021-11-28: qty 1

## 2021-11-28 MED ORDER — FENTANYL CITRATE (PF) 100 MCG/2ML IJ SOLN
INTRAMUSCULAR | Status: AC
  Filled 2021-11-28: qty 2

## 2021-11-28 MED ORDER — ATORVASTATIN CALCIUM 20 MG PO TABS
80.0000 mg | ORAL_TABLET | Freq: Every day | ORAL | Status: DC
  Administered 2021-11-28 – 2021-11-30 (×3): 80 mg via ORAL
  Filled 2021-11-28 (×3): qty 4

## 2021-11-28 MED ORDER — CHLORHEXIDINE GLUCONATE CLOTH 2 % EX PADS
6.0000 | MEDICATED_PAD | Freq: Every day | CUTANEOUS | Status: DC
  Administered 2021-11-28: 6 via TOPICAL

## 2021-11-28 MED ORDER — DIAZEPAM 5 MG PO TABS: ORAL_TABLET | ORAL | 0 refills | Status: DC

## 2021-11-28 MED ORDER — PYRIDOSTIGMINE BROMIDE 60 MG PO TABS
30.0000 mg | ORAL_TABLET | Freq: Two times a day (BID) | ORAL | Status: DC
  Administered 2021-11-28 – 2021-11-30 (×5): 30 mg via ORAL
  Filled 2021-11-28 (×5): qty 0.5

## 2021-11-28 MED ORDER — LORATADINE 10 MG PO TABS
10.0000 mg | ORAL_TABLET | Freq: Every day | ORAL | Status: DC
  Administered 2021-11-28 – 2021-11-30 (×3): 10 mg via ORAL
  Filled 2021-11-28 (×3): qty 1

## 2021-11-28 MED ORDER — CHLORTHALIDONE 25 MG PO TABS
25.0000 mg | ORAL_TABLET | Freq: Every day | ORAL | Status: DC
  Administered 2021-11-30: 25 mg via ORAL
  Filled 2021-11-28 (×2): qty 1

## 2021-11-28 MED ORDER — VERAPAMIL HCL 2.5 MG/ML IV SOLN
INTRAVENOUS | Status: DC | PRN
  Administered 2021-11-28: 2.5 mg via INTRAVENOUS

## 2021-11-28 MED ORDER — SODIUM CHLORIDE 0.9 % IV SOLN: 250.0000 mL | INTRAVENOUS | Status: DC | PRN

## 2021-11-28 MED ORDER — TICAGRELOR 90 MG PO TABS
ORAL_TABLET | ORAL | Status: DC | PRN
  Administered 2021-11-28: 180 mg via ORAL

## 2021-11-28 MED ORDER — SODIUM CHLORIDE 0.9 % WEIGHT BASED INFUSION
1.0000 mL/kg/h | INTRAVENOUS | Status: DC
  Administered 2021-11-28: 1 mL/kg/h via INTRAVENOUS

## 2021-11-28 MED ORDER — PROPRANOLOL HCL 20 MG PO TABS
40.0000 mg | ORAL_TABLET | Freq: Two times a day (BID) | ORAL | Status: DC
  Administered 2021-11-28 – 2021-11-30 (×5): 40 mg via ORAL
  Filled 2021-11-28 (×5): qty 2

## 2021-11-28 MED ORDER — DIAZEPAM 5 MG PO TABS
5.0000 mg | ORAL_TABLET | Freq: Every morning | ORAL | Status: DC
  Administered 2021-11-28 – 2021-11-30 (×3): 5 mg via ORAL
  Filled 2021-11-28 (×3): qty 1

## 2021-11-28 MED ORDER — DIAZEPAM 5 MG PO TABS
10.0000 mg | ORAL_TABLET | Freq: Every day | ORAL | Status: DC
  Administered 2021-11-28 – 2021-11-29 (×3): 10 mg via ORAL
  Filled 2021-11-28 (×3): qty 2

## 2021-11-28 MED ORDER — MIDAZOLAM HCL 2 MG/2ML IJ SOLN
INTRAMUSCULAR | Status: AC
  Filled 2021-11-28: qty 2

## 2021-11-28 MED ORDER — IOHEXOL 300 MG/ML  SOLN
INTRAMUSCULAR | Status: DC | PRN
  Administered 2021-11-28: 175 mL

## 2021-11-28 MED ORDER — HYDRALAZINE HCL 20 MG/ML IJ SOLN: 10.0000 mg | INTRAMUSCULAR | Status: DC | PRN

## 2021-11-28 MED ORDER — MORPHINE SULFATE (PF) 2 MG/ML IV SOLN: 2.0000 mg | INTRAVENOUS | Status: DC | PRN

## 2021-11-28 MED ORDER — TICAGRELOR 90 MG PO TABS
90.0000 mg | ORAL_TABLET | Freq: Two times a day (BID) | ORAL | Status: DC
  Administered 2021-11-28 – 2021-11-30 (×5): 90 mg via ORAL
  Filled 2021-11-28 (×5): qty 1

## 2021-11-28 MED ORDER — LIDOCAINE HCL (PF) 1 % IJ SOLN
INTRAMUSCULAR | Status: DC | PRN
  Administered 2021-11-28: 2 mL

## 2021-11-28 MED ORDER — POTASSIUM CHLORIDE CRYS ER 20 MEQ PO TBCR
40.0000 meq | EXTENDED_RELEASE_TABLET | Freq: Once | ORAL | Status: AC
  Administered 2021-11-28: 40 meq via ORAL
  Filled 2021-11-28: qty 2

## 2021-11-28 MED ORDER — POLYETHYLENE GLYCOL 3350 17 G PO PACK
17.0000 g | PACK | Freq: Every day | ORAL | Status: DC
  Administered 2021-11-28: 17 g via ORAL

## 2021-11-28 MED ORDER — HEPARIN SODIUM (PORCINE) 1000 UNIT/ML IJ SOLN
INTRAMUSCULAR | Status: DC | PRN
  Administered 2021-11-28: 3000 [IU] via INTRAVENOUS
  Administered 2021-11-28: 3500 [IU] via INTRAVENOUS

## 2021-11-28 MED ORDER — FENTANYL CITRATE (PF) 100 MCG/2ML IJ SOLN
INTRAMUSCULAR | Status: DC | PRN
Start: 2021-11-28 — End: 2021-11-28
  Administered 2021-11-28 (×2): 25 ug via INTRAVENOUS

## 2021-11-28 MED ORDER — HEPARIN SODIUM (PORCINE) 1000 UNIT/ML IJ SOLN
INTRAMUSCULAR | Status: AC
  Filled 2021-11-28: qty 10

## 2021-11-28 MED ORDER — RISAQUAD PO CAPS
1.0000 | ORAL_CAPSULE | Freq: Every day | ORAL | Status: DC
  Administered 2021-11-28 – 2021-11-30 (×3): 1 via ORAL
  Filled 2021-11-28 (×3): qty 1

## 2021-11-28 MED ORDER — ONDANSETRON HCL 4 MG PO TABS: 4.0000 mg | ORAL_TABLET | Freq: Four times a day (QID) | ORAL | Status: DC | PRN

## 2021-11-28 MED ORDER — MELATONIN 5 MG PO TABS
5.0000 mg | ORAL_TABLET | Freq: Every day | ORAL | Status: DC
  Administered 2021-11-28 – 2021-11-29 (×2): 5 mg via ORAL
  Filled 2021-11-28 (×2): qty 1

## 2021-11-28 MED ORDER — ACETAMINOPHEN 325 MG PO TABS: 650.0000 mg | ORAL_TABLET | ORAL | Status: DC | PRN

## 2021-11-28 MED ORDER — BISACODYL 5 MG PO TBEC: 10.0000 mg | DELAYED_RELEASE_TABLET | Freq: Every day | ORAL | Status: DC | PRN

## 2021-11-28 MED ORDER — TICAGRELOR 90 MG PO TABS
ORAL_TABLET | ORAL | Status: AC
  Filled 2021-11-28: qty 2

## 2021-11-28 MED ORDER — HYDROCODONE-ACETAMINOPHEN 10-325 MG PO TABS
1.0000 | ORAL_TABLET | Freq: Three times a day (TID) | ORAL | Status: DC | PRN
  Administered 2021-11-28 – 2021-11-30 (×6): 1 via ORAL
  Filled 2021-11-28 (×6): qty 1

## 2021-11-28 MED ORDER — SODIUM CHLORIDE 0.9% FLUSH
3.0000 mL | Freq: Two times a day (BID) | INTRAVENOUS | Status: DC
  Administered 2021-11-28 – 2021-11-30 (×5): 3 mL via INTRAVENOUS

## 2021-11-28 MED ORDER — EZETIMIBE 10 MG PO TABS
10.0000 mg | ORAL_TABLET | Freq: Every day | ORAL | Status: DC
  Administered 2021-11-28 – 2021-11-30 (×3): 10 mg via ORAL
  Filled 2021-11-28 (×3): qty 1

## 2021-11-28 MED ORDER — MIDAZOLAM HCL 2 MG/2ML IJ SOLN
INTRAMUSCULAR | Status: DC | PRN
  Administered 2021-11-28 (×2): 1 mg via INTRAVENOUS

## 2021-11-28 MED ORDER — ASPIRIN 81 MG PO CHEW
81.0000 mg | CHEWABLE_TABLET | Freq: Every day | ORAL | Status: DC
  Administered 2021-11-28 – 2021-11-30 (×3): 81 mg via ORAL
  Filled 2021-11-28 (×3): qty 1

## 2021-11-28 MED ORDER — DICYCLOMINE HCL 20 MG PO TABS
20.0000 mg | ORAL_TABLET | Freq: Three times a day (TID) | ORAL | Status: DC
  Administered 2021-11-28 – 2021-11-30 (×9): 20 mg via ORAL
  Filled 2021-11-28 (×11): qty 1

## 2021-11-28 MED ORDER — VIBEGRON 75 MG PO TABS: 75.0000 mg | ORAL_TABLET | Freq: Every day | ORAL | Status: DC

## 2021-11-28 MED ORDER — ESCITALOPRAM OXALATE 20 MG PO TABS
20.0000 mg | ORAL_TABLET | Freq: Every day | ORAL | Status: DC
  Administered 2021-11-28 – 2021-11-30 (×3): 20 mg via ORAL
  Filled 2021-11-28 (×3): qty 1

## 2021-11-28 MED ORDER — ENOXAPARIN SODIUM 40 MG/0.4ML IJ SOSY
40.0000 mg | PREFILLED_SYRINGE | INTRAMUSCULAR | Status: DC
  Administered 2021-11-29: 40 mg via SUBCUTANEOUS
  Filled 2021-11-28: qty 0.4

## 2021-11-28 MED ORDER — HYDROCODONE BITARTRATE ER 10 MG PO CP12: 10.0000 mg | ORAL_CAPSULE | Freq: Two times a day (BID) | ORAL | Status: DC

## 2021-11-28 NOTE — TOC Benefit Eligibility Note (Signed)
Patient Advocate Encounter ?  ?Insurance verification completed.   ?  ?The patient is currently admitted and upon discharge could be taking BRILINTA. ?  ?The current 30 day co-pay is, $30.  ? ?The patient is insured through [employer] ? ? ?  ? ?

## 2021-11-28 NOTE — H&P (Signed)
?History and Physical  ? ? ?Patient: Grant Young YSA:[id number] DOB: 08-Mar-1946 ?DOA: 11/27/2021 ?DOS: the patient was seen and examined on 11/28/2021 ?PCP: [name], NP  ?Patient coming from: Home ? ?Chief Complaint:  ?Chief Complaint  ?Patient presents with  ? Chest Pain  ? ?HPI: Grant Young is a 76 y.o. male with medical history significant of MI in 1998, most recently evaluated with nonischemic Myoview stress test in 2017 who is being admitted to the hospitalist service status post cath for severe ACS with ongoing chest pain.  Patient was in his usual state of health until 8 PM on 11/27/2021 when he developed severe typical chest pain, precordial radiating to jaw and left arm.  According to cardiologist, Dr. Ellyn Hack, he did not meet STEMI criteria but did have persistent pain and EKG changes concerning for left main CAD.  He was taken to the ED Cath Lab where he was found to have severe two-vessel CAD with 100% occluded proximal and mid LCx and sequential ostial proximal LAD 80 to 99% stenosis.  He underwent stent placement to occluded LCx and ostial to mid LAD.  He was transferred to the ICU from the Cath Lab in stable condition.  ?Review of Systems: As mentioned in the history of present illness. All other systems reviewed and are negative. ?Past Medical History:  ?Diagnosis Date  ? Anxiety   ? Per New Patient Packet  ? Chronic back pain   ? Per New Patient Packet  ? Chronic heart disease   ? Per New Patient Packet  ? Depression   ? GERD (gastroesophageal reflux disease)   ? Headache   ? History of CT scan of brain 09/14/2018  ? Per New Patient Packet  ? History of depression   ? Per New Patient Packet  ? History of gastritis   ? Per New Patient Packet  ? History of headache   ? Per New Patient Packet  ? History of kidney stones   ? Per New Patient Packet  ? History of neuropathy   ? Per New Patient Packet  ? Hypertension   ? Per New Patient Packet  ? Insomnia   ? Kidney stone   ? Lumbar radicular  pain   ? Per New Patient Packet  ? Lyme disease   ? Per New Patient Packet  ? Myocardial infarction [facility])   ? Overactive bladder   ? PONV (postoperative nausea and vomiting)   ? Skin cancer, basal cell   ? Sleep trouble   ? Per New Patient Packet  ? Spinal cord stimulator status   ? 01/08/21 - not currently using.  ? Squamous cell skin cancer   ? Substance abuse ([place])   ? ?Past Surgical History:  ?Procedure Laterality Date  ? BACK SURGERY    ? CARDIAC CATHETERIZATION    ? CATARACT EXTRACTION W/PHACO Left 01/14/2021  ? Procedure: CATARACT EXTRACTION PHACO AND INTRAOCULAR LENS PLACEMENT ([place]) LEFT VIVITY TORIC LENS 8.75 00:56.7;  Surgeon: Birder Robson, MD;  Location: [place];  Service: Ophthalmology;  Laterality: Left;  ? CATARACT EXTRACTION W/PHACO Right 01/28/2021  ? Procedure: CATARACT EXTRACTION PHACO AND INTRAOCULAR LENS PLACEMENT ([place]) RIGHT VIVITY TORIC LENS;  Surgeon: Birder Robson, MD;  Location: [place];  Service: Ophthalmology;  Laterality: Right;  6.54 ?00:46.4  ? CHOLECYSTECTOMY  2017  ? COLONOSCOPY  09/15/2015  ? Per New Patient Packet  ? COLONOSCOPY WITH PROPOFOL N/A 10/03/2020  ? Procedure: COLONOSCOPY WITH PROPOFOL;  Surgeon: Vicente Males,  Bailey Mech, MD;  Location: [place];  Service: Gastroenterology;  Laterality: N/A;  ? ESOPHAGOGASTRODUODENOSCOPY (EGD) WITH PROPOFOL N/A 10/03/2020  ? Procedure: ESOPHAGOGASTRODUODENOSCOPY (EGD) WITH PROPOFOL;  Surgeon: [name], MD;  Location: [facility] ENDOSCOPY;  Service: Gastroenterology;  Laterality: N/A;  ? FOOT SURGERY Bilateral 03/18/2020  ? FOOT SURGERY  08/032021  ? GALLBLADDER SURGERY  09/15/2015  ? Gallbladder Removal. Procedure done by Dr.Beverly. Per New Patient Packet  ? KIDNEY STONE SURGERY  09/14/1980  ? Too many to count. Per New Patient Packet 09/14/1980-09/15/1995  ? KIDNEY STONE SURGERY  09/15/1995  ? Too many to count. Per New Patient Packet  ? LITHOTRIPSY  09/14/2014  ? Per New Patient Packet  ? LITHOTRIPSY  09/14/1996  ? No  Stints Used. Per New Patient Packet  ? LITHOTRIPSY  09/14/1997  ? No Stints used. Per New Patient Packet  ? PAIN PUMP IMPLANTATION    ? PAIN PUMP REMOVAL    ? SHOULDER SURGERY Right 1984  ? SIGMOIDOSCOPY  09/14/2017  ? Per New Patient Packet  ? SPINAL CORD STIMULATOR IMPLANT    ? TONSILLECTOMY    ? UPPER GI ENDOSCOPY    ? ?Social History:  reports that he has never smoked. He has never used smokeless tobacco. He reports current alcohol use. He reports that he does not currently use drugs. ? ?Allergies  ?Allergen Reactions  ? Ace Inhibitors   ?  Other reaction(s): Cough  ? Fluoxetine Anxiety  ?  "made me fall asleep" per pt ?"bad headaches and "makes  Me  Crazy" ?historical allergy noted in [employer] ?"made me fall asleep" per pt ?"bad headaches and "makes  Me  Crazy" ?Per New Patient Packet.  ?  ? Metoclopramide   ?  Other reaction(s): Other (See Comments), Other (See Comments), Unknown ?Tardive Dyskinesia  ?historical allergy noted in [employer] ?Tardive Dyskinesia  ?Per New Patient Packet.  ? Nalbuphine   ?  Used Post Back surgery- Anesthesiologist Error. Patient had Narcotic Withdraw. Per New Patient Packet.   ? Other   ?  Other reaction(s): Other (See Comments) ?Altered mental status in combo with narcotics at previous hospitalization - Full Withdrawal Symptoms ?Other reaction(s): Rash  ? Amoxicillin-Pot Clavulanate Nausea Only  ?  Per New Patient Packet.  ? Doxazosin Rash  ?  Other reaction(s): Other - See Comments, Rash ?UNKNOWN REACTION ?UNKNOWN REACTION ?  ? Duloxetine Nausea Only  ?  Per New Patient Packet.   ? Penicillins Nausea Only  ?  Per New Patient Packet.  ? Tamsulosin Itching and Anxiety  ?  Restless, Flushing, Heavy Chest, Itching, Hyperactive mood and Anxiety. Unable to handle side effects. Per New Patient Packet.  ?  ? Trazodone And Nefazodone Itching, Anxiety and Rash  ?  Headache. ?"INCREASED MY ANXIETY AND HEARTRATE" ?Flushing, tachycardia ?"INCREASED MY ANXIETY AND HEARTRATE" ?Per New Patient  Packet. ?  ? Amlodipine   ?  Shaking, unsure of reaction. Per New Patient Packet.  ?  ? Cinoxacin   ?  GI Intolerance, and Dizziness. Per New Patient Packet.   ? Ciprofloxacin   ?  Other reaction(s): Unknown  ? Fludrocortisone Other (See Comments)  ?  "Worsening headaches, GI issues, fatigue"  ? Nebivolol   ?  Other reaction(s): Unknown  ? Olanzapine   ?  Headache and unable to sleep for 3 nights. Per New Patient Packet. ?  ? Olmesartan   ?  Other reaction(s): Unknown  ? Pregabalin   ?  Confusion, Lack of concentration, dizziness, and  likely drowsiness. Per New Patient Packet.  ?  ? Prostaglandins   ?  Other reaction(s): Other (See Comments) ?Intolerance  ? Thyroid Hormones   ?  Other reaction(s): Other (See Comments) ?Thyroid ([place] Thyroid) contraindicated with some of your other medications.  ? Zolpidem   ?  Nightmares, Ineffective after 2 days. Per New Patient Packet.   ? Duloxetine Hcl   ?  Other reaction(s): Rash  ? Fluoxetine Hcl   ?  Other reaction(s): Rash  ? Nucynta [Tapentadol] Other (See Comments)  ?  Vertigo   ? Phenytoin Anxiety  ?  Hyperactivity, and Ineffective. Per New Patient Packet.   ? ? ?Family History  ?Problem Relation Age of Onset  ? Heart disease Father   ? Heart failure Father   ? Hypertension Father   ? Stroke Father   ? Stroke Mother   ? Dementia Mother   ? Kidney Stones Daughter   ? Anxiety disorder Daughter   ? OCD Daughter   ? ? ?Prior to Admission medications   ?Medication Sig Start Date End Date Taking? Authorizing Provider  ?chlorthalidone (HYGROTON) 25 MG tablet TAKE 1 TABLET (25 MG TOTAL) BY MOUTH DAILY. 10/15/21  Yes [name], NP  ?diazepam (VALIUM) 5 MG tablet 5 mg PO in am and 2 tabs PO QHS 10/29/21  Yes [name], [name], NP  ?escitalopram (LEXAPRO) 20 MG tablet TAKE 1 TABLET BY MOUTH EVERY DAY 10/01/21  Yes [name], NP  ?propranolol (INDERAL) 40 MG tablet Take 1 tablet (40 mg total) by mouth 2 (two) times daily. 11/13/21  Yes [name], NP  ?pyridostigmine  (MESTINON) 60 MG tablet Take 1 tablet (60 mg total) by mouth 2 (two) times daily. ?Patient taking differently: Take 30 mg by mouth 2 (two) times daily. 10/15/21  Yes [name], NP  ?RABEprazole (ACIPHEX) 20

## 2021-11-28 NOTE — Assessment & Plan Note (Signed)
--   S/p stenting of ostial proximal LAD and proximal LCx with long stents. ?-- Per cardiology: dual antiplatelet therapy with Aspirin '81mg'$  daily and Ticagrelor '90mg'$  twice daily long-term (beyond 12 months)  ?--Would consider discontinuing aspirin and continuing Thienopyridine for minimum of 1 extra year after initial 1 year. If tolerating well, would continue long-term  ? ?

## 2021-11-28 NOTE — Progress Notes (Signed)
Pt has been stable, alert, oriented x4.  No events reported today.  Good urine output with no cardiac signs or symptoms. ?

## 2021-11-28 NOTE — Assessment & Plan Note (Signed)
Hydralazine as needed and continue chlorthalidone ?

## 2021-11-28 NOTE — Progress Notes (Signed)
Patient arrived from the cath lab accompanied by cath lab team. CHG bath provided. EKG obtained. VSS. TR band in place rt radial, hand cyanotic. 67m air removed with some improvement to rt hand coloration. Patient denies any pain at this time. Wife at the bedside. Will continue to monitor.  ?

## 2021-11-28 NOTE — Progress Notes (Signed)
Attempted to remove air from TR band x 3 since arrival and site oozes every time. Currently 75m air in TR band. Will continue to monitor.  ?

## 2021-11-28 NOTE — Progress Notes (Signed)
?  Transition of Care (TOC) Screening Note ? ? ?Patient Details  ?Name: Grant Young ?Date of Birth: Jan 05, 1946 ? ? ?Transition of Care (TOC) CM/SW Contact:    ?Shelbie Hutching, RN ?Phone Number: ?11/28/2021, 10:46 AM ? ? ? ?Transition of Care Department [facility]) has reviewed patient and no TOC needs have been identified at this time. We will continue to monitor patient advancement through interdisciplinary progression rounds. If new patient transition needs arise, please place a TOC consult. ?  ?

## 2021-11-28 NOTE — Plan of Care (Signed)

## 2021-11-28 NOTE — Assessment & Plan Note (Signed)
Continue escitalopram and diazepam ?

## 2021-11-28 NOTE — Assessment & Plan Note (Signed)
Continue atorvastatin

## 2021-11-28 NOTE — Assessment & Plan Note (Signed)
As above.

## 2021-11-28 NOTE — Plan of Care (Signed)
Patient was seen and examined in the ICU post cath ?Patient was resting comfortably, denied any chest pain, no palpitations.  Patient was just feeling generalized weakness and tired ?We will continue current treatment, we will follow cardiology clearance, most likely discharge tomorrow a.m. once patient is ambulatory ?

## 2021-11-28 NOTE — Assessment & Plan Note (Signed)
Pending medication verification to resume home meds ?

## 2021-11-28 NOTE — Assessment & Plan Note (Signed)
No acute issues.

## 2021-11-28 NOTE — Assessment & Plan Note (Signed)
Continue MiraLAX, Carafate, acidophilus, Bentyl as needed and pending medication reconciliation ?

## 2021-11-28 NOTE — Progress Notes (Signed)
*  PRELIMINARY RESULTS* ?Echocardiogram ?2D Echocardiogram has been performed. ? ?Marcel Sorter, Sonia Side ?11/28/2021, 10:34 AM ?

## 2021-11-28 NOTE — Progress Notes (Signed)
Shift Summary: ?TR Band removed per protocol. Pressure dressing applied. Scant amount of sanguinous drainage at site. Small amount of bruising at site. EKG x 2 obtained. 500 UOP via urinal. No acute events. Wife to bring in [place] for administration.  ?

## 2021-11-28 NOTE — Progress Notes (Signed)
? ? ?Progress Note ? ?Patient Name: Grant Young ?Date of Encounter: 11/28/2021 ? ?Primary Cardiologist: Agbor-Etang ? ?Subjective  ? ?Status post PCI/DES to the LAD and LCx last evening. LVEF 45-50% by LV gram with lateral wall hypokinesis. No chest pain, dyspnea, palpitations, dizziness, presyncope, or syncope. No cath site complications. Post cath labs and vitals stable.  ? ?Inpatient Medications  ?  ?Scheduled Meds: ? acidophilus  1 capsule Oral Daily  ? aspirin  81 mg Oral Daily  ? atorvastatin  80 mg Oral Daily  ? Chlorhexidine Gluconate Cloth  6 each Topical Q0600  ? [START ON 11/29/2021] chlorthalidone  25 mg Oral Daily  ? diazepam  10 mg Oral QHS  ? diazepam  5 mg Oral q morning  ? dicyclomine  20 mg Oral TID AC & HS  ? [START ON 11/29/2021] enoxaparin (LOVENOX) injection  40 mg Subcutaneous Q24H  ? escitalopram  20 mg Oral Daily  ? ezetimibe  10 mg Oral Daily  ? heparin      ? loratadine  10 mg Oral Daily  ? melatonin  5 mg Oral QHS  ? propranolol  40 mg Oral BID  ? pyridostigmine  30 mg Oral BID  ? sodium chloride flush  3 mL Intravenous Q12H  ? sucralfate  1 g Oral TID WC & HS  ? ticagrelor  90 mg Oral BID  ? Vibegron  75 mg Oral Daily  ? ?Continuous Infusions: ? sodium chloride    ? sodium chloride 1 mL/kg/hr (11/28/21 0213)  ? ?PRN Meds: ?sodium chloride, acetaminophen, HYDROcodone-acetaminophen, morphine injection, nitroGLYCERIN, ondansetron (ZOFRAN) IV, ondansetron, polyethylene glycol, sodium chloride flush  ? ?Vital Signs  ?  ?Vitals:  ? 11/28/21 0700 11/28/21 0730 11/28/21 0820 11/28/21 0830  ?BP: 116/69 111/70  109/70  ?Pulse: 63 62  71  ?Resp:    18  ?Temp:   97.7 ?F (36.5 ?C)   ?TempSrc:   Oral   ?SpO2: 98% 98%  98%  ?Weight:      ?Height:      ? ? ?Intake/Output Summary (Last 24 hours) at 11/28/2021 1012 ?Last data filed at 11/28/2021 0830 ?Gross per 24 hour  ?Intake 531.48 ml  ?Output 960 ml  ?Net -428.52 ml  ? ?Filed Weights  ? 11/27/21 2114 11/28/21 0157  ?Weight: 64 kg 63.5 kg   ? ? ?Telemetry  ?  ?SR with PVCs, and 10 beat run of NSVT - Personally Reviewed ? ?ECG  ?  ?NSR, 62 bpm, left axis deviation, nonspecific st/t changes - Personally Reviewed ? ?Physical Exam  ? ?GEN: No acute distress.   ?Neck: No JVD. ?Cardiac: RRR, I/VI systolic murmur RUSB, no rubs, or gallops. Right radial arteriotomy site without bleeding, bruising, swelling, erythema, warmth, or TTP. Radial pulse 2+.  ?Respiratory: Clear to auscultation bilaterally.  ?GI: Soft, nontender, non-distended.   ?MS: No edema; No deformity. ?Neuro:  Alert and oriented x 3; Nonfocal.  ?Psych: Normal affect. ? ?Labs  ?  ?Chemistry ?Recent Labs  ?Lab 11/27/21 ?2116 11/28/21 ?0213  ?NA 138 140  ?K 3.4* 3.2*  ?CL 98 102  ?CO2 27 31  ?GLUCOSE 121* 117*  ?BUN 14 14  ?CREATININE 0.87 0.87  ?CALCIUM 9.5 9.0  ?GFRNONAA >60 >60  ?ANIONGAP 13 7  ?  ? ?Hematology ?Recent Labs  ?Lab 11/27/21 ?2116 11/28/21 ?0213  ?WBC 8.9 8.2  ?RBC 5.18 5.07  ?HGB 15.1 14.7  ?HCT 45.7 44.1  ?MCV 88.2 87.0  ?MCH 29.2 29.0  ?MCHC  33.0 33.3  ?RDW 12.9 12.9  ?PLT 224 213  ? ? ?Cardiac EnzymesNo results for input(s): TROPONINI in the last 168 hours. No results for input(s): TROPIPOC in the last 168 hours.  ? ?BNPNo results for input(s): BNP, PROBNP in the last 168 hours.  ? ?DDimer No results for input(s): DDIMER in the last 168 hours.  ? ?Radiology  ?  ?DG Chest 2 View ? ?Result Date: 11/27/2021 ?IMPRESSION: No active cardiopulmonary disease. Electronically Signed   By: Anner Crete M.D.   On: 11/27/2021 21:51  ? ?Cardiac Studies  ? ?LHC 11/27/2021: ?  CULPRIT LESION: Prox Cx to Mid Cx lesion is 100% stenosed with 70% stenosed side branch in 2nd Mrg. ?  A drug-eluting stent was successfully placed using a STENT ONYX FRONTIER 2.5X26.  Deployed to 2.7 mm ?  Post intervention, there is a 0% residual stenosis. Post intervention, the side branch was reduced to 60% residual stenosis. ?  ------------------------------------------------- ?  LESION SEGMENT #2: Ost LAD to  Prox LAD lesion is 75% stenosed.  Prox LAD to Mid LAD lesion is 80% stenosed. Mid LAD lesion is 99% stenosed. ?  A drug-eluting stent was successfully placed covering all 3 lesions, using a STENT ONYX FRONTIER 2.5X34.  Postdilated to 2.8 mm ?  Post intervention, there is a 0% residual stenosis. ?  ------------------------------------------------- ?  Moderately calcified very large dominant RCA with mild disease throughout. ?  ------------------------------------------------- ?  There is mild left ventricular systolic dysfunction. The left ventricular ejection fraction is 45-50% by visual estimate. -Lateral wall hypokinesis ?  LV end diastolic pressure is severely elevated. ?  There is no aortic valve stenosis. ?  ?SUMMARY ?Acute Coronary Syndrome / NSTEMI (with STEMI physiology) ?Severe two-vessel CAD :  ?Culprit lesion 100% occluded proximal and mid LCx  ?Sequential ostial proximal LAD 75, 80 to 99% stenosis ?Successful revascularization of occluded LCx with Onyx Frontier 2.5 mm x 26 mm deployed to 2.7 mm.  TIMI 0 flow improved to TIMI-3.  Also TIMI-3 flow noted in major/small caliber OM branch with an ostial 60 to 70% stenosis (not PCI during ?Successful ostial to mid LAD PCI covering the schedule lesions with an Onyx Frontier 2.5 mm x 34 mm-deployed/postdilated to 2.8 mm ?Heavily calcified but widely patent large dominant RCA that did provide collaterals to the occluded LCx ?Mildly reduced EF with lateral hypokinesis and significant elevated LVEDP of 28 mmHg ?  ?  ?RECOMMENDATIONS ?ICU admission for post PCI care. ?Very complex medication list, will need to figure out the best way to treat lipids.  He is on propranolol which was the only tolerated beta-blocker.  Marland Kitchen ?DAPT per recommendations section ?Check 2D echo ?Pending echo results, and symptoms, could potentially consider discharge in 2 to 3 days. ?__________ ? ?2D echo pending ? ? ?Patient Profile  ?   ?76 y.o. male with history of CAD with MI x 2 in 1998 and  1999 with PTCA only in [place], HTN, HLD, headaches, kidney stones, GERD, and multiple medication intolerances who was admitted to [facility] on 11/27/2021 with an NSTEMI s/p PCI/DES to the LAD and LCx.  ? ?Assessment & Plan  ?  ?1. CAD involving the native coronary arteries with NSTEMI: ?-Status post PCI/DES to the LAD and LCx as outlined above ?-No chest pain or symptoms of decompensation  ?-DAPT with ASA and Brilinta without interruption for at least 12 months ?-Echo pending ?-Aggressive risk factor modification ?-Post cath instructions ?-Cardiac rehab ? ?2. HFrEF: ?-He does  not appear grossly volume up  ?-Echo pending ?-Currently on PTA propranolol, as it is noted this is the only beta blocker he has tolerated, if he has significant LV dysfunction on echo, we may need to rechallenge him with Coreg or metoprolol ?-If LV dysfunction is noted on echo, would also need to consider rechallenge with ARB with continued escalation of GDMT as tolerated to optimize medical therapy ?-CHF education ? ?3. HTN: ?-Blood pressure currently well controlled ?-PTA propranolol and chorthalidone  ? ?4. HLD: ?-LDL 103 in 10/2021 ?-Started on Lipitor 80 mg upon admission (no documented intolerance or allergy to statins) ?-PTA Zetia ?-Goal LDL < 50 ?-Outpatient follow up ? ?5. PVCs/NSVT: ?-Replete potassium to goal 4.0 ?-Check magnesium with recommendation to replete to goal 2.0 as indicated ?-PTA propranolol as above ?   ? ?For questions or updates, please contact [place] ?Please consult [email or web address] for contact info under Cardiology/STEMI. ?  ? ?Signed, ?Christell Faith, PA-C ?[facility] ?Pager: [phone] ?11/28/2021, 10:12 AM ? ?

## 2021-11-28 NOTE — Assessment & Plan Note (Signed)
Hold hydrocodone for now while getting morphine for pain ?

## 2021-11-28 NOTE — Assessment & Plan Note (Signed)
Continue amitriptyline pending med rec ?

## 2021-11-28 NOTE — Assessment & Plan Note (Signed)
Question with new meds given 26 listed allergies ?

## 2021-11-29 DIAGNOSIS — I214 Non-ST elevation (NSTEMI) myocardial infarction: Principal | ICD-10-CM

## 2021-11-29 LAB — BASIC METABOLIC PANEL
Anion gap: 8 (ref 5–15)
BUN: 14 mg/dL (ref 8–23)
CO2: 26 mmol/L (ref 22–32)
Calcium: 8.8 mg/dL — ABNORMAL LOW (ref 8.9–10.3)
Chloride: 105 mmol/L (ref 98–111)
Creatinine, Ser: 0.69 mg/dL (ref 0.61–1.24)
GFR, Estimated: >60 mL/min (ref >60)
Glucose, Bld: 106 mg/dL — ABNORMAL HIGH (ref 70–99)
Potassium: 3.3 mmol/L — ABNORMAL LOW (ref 3.5–5.1)
Sodium: 139 mmol/L (ref 135–145)

## 2021-11-29 LAB — CBC
HCT: 41.5 % (ref 39.0–52.0)
Hemoglobin: 13.6 g/dL (ref 13.0–17.0)
MCH: 28.9 pg (ref 26.0–34.0)
MCHC: 32.8 g/dL (ref 30.0–36.0)
MCV: 88.1 fL (ref 80.0–100.0)
Platelets: 199 10*3/uL (ref 150–400)
RBC: 4.71 MIL/uL (ref 4.22–5.81)
RDW: 13 % (ref 11.5–15.5)
WBC: 9.4 10*3/uL (ref 4.0–10.5)
nRBC: 0 % (ref 0.0–0.2)

## 2021-11-29 LAB — PHOSPHORUS: Phosphorus: 2.3 mg/dL — ABNORMAL LOW (ref 2.5–4.6)

## 2021-11-29 MED ORDER — POTASSIUM CHLORIDE CRYS ER 20 MEQ PO TBCR
20.0000 meq | EXTENDED_RELEASE_TABLET | Freq: Once | ORAL | Status: AC
  Administered 2021-11-29: 20 meq via ORAL
  Filled 2021-11-29: qty 1

## 2021-11-29 MED ORDER — POTASSIUM CHLORIDE CRYS ER 20 MEQ PO TBCR
40.0000 meq | EXTENDED_RELEASE_TABLET | Freq: Once | ORAL | Status: AC
  Administered 2021-11-29: 40 meq via ORAL
  Filled 2021-11-29: qty 2

## 2021-11-29 MED ORDER — ENOXAPARIN SODIUM 40 MG/0.4ML IJ SOSY: 40.0000 mg | PREFILLED_SYRINGE | Freq: Every day | INTRAMUSCULAR | Status: DC

## 2021-11-29 MED ORDER — LOPERAMIDE HCL 2 MG PO CAPS
4.0000 mg | ORAL_CAPSULE | ORAL | Status: DC | PRN
Start: 2021-11-29 — End: 2021-11-30
  Administered 2021-11-29: 4 mg via ORAL
  Filled 2021-11-29: qty 2

## 2021-11-29 MED ORDER — K PHOS MONO-SOD PHOS DI & MONO 155-852-130 MG PO TABS
500.0000 mg | ORAL_TABLET | Freq: Four times a day (QID) | ORAL | Status: AC
  Administered 2021-11-29 (×2): 500 mg via ORAL
  Filled 2021-11-29 (×2): qty 2

## 2021-11-29 NOTE — Progress Notes (Signed)
? ? ?Progress Note ? ?Patient Name: SHAHID FLORI ?Date of Encounter: 11/29/2021 ? ?Primary Cardiologist: Agbor-Etang ? ?Subjective  ? ?Status post PCI/DES to the LAD and LCx 3/16. LVEF 45-50% by LV gram with lateral wall hypokinesis. ? ?He denies any chest pain or SOB.  ? ?Inpatient Medications  ?  ?Scheduled Meds: ? acidophilus  1 capsule Oral Daily  ? aspirin  81 mg Oral Daily  ? atorvastatin  80 mg Oral Daily  ? Chlorhexidine Gluconate Cloth  6 each Topical Q0600  ? chlorthalidone  25 mg Oral Daily  ? diazepam  10 mg Oral QHS  ? diazepam  5 mg Oral q morning  ? dicyclomine  20 mg Oral TID AC & HS  ? [START ON 11/30/2021] enoxaparin (LOVENOX) injection  40 mg Subcutaneous QHS  ? escitalopram  20 mg Oral Daily  ? ezetimibe  10 mg Oral Daily  ? loratadine  10 mg Oral Daily  ? melatonin  5 mg Oral QHS  ? phosphorus  500 mg Oral QID  ? polyethylene glycol  17 g Oral Daily  ? potassium chloride  20 mEq Oral Once  ? propranolol  40 mg Oral BID  ? pyridostigmine  30 mg Oral BID  ? sodium chloride flush  3 mL Intravenous Q12H  ? sucralfate  1 g Oral TID WC & HS  ? ticagrelor  90 mg Oral BID  ? Vibegron  75 mg Oral Daily  ? ?Continuous Infusions: ? sodium chloride    ? ?PRN Meds: ?sodium chloride, acetaminophen, bisacodyl, HYDROcodone-acetaminophen, loperamide, morphine injection, nitroGLYCERIN, ondansetron (ZOFRAN) IV, ondansetron, polyethylene glycol, sodium chloride flush  ? ?Vital Signs  ?  ?Vitals:  ? 11/29/21 0800 11/29/21 0900 11/29/21 0947 11/29/21 1000  ?BP: 122/73 111/78 106/84 106/67  ?Pulse: 68 72 75 77  ?Resp: (!) '8 15  12  '$ ?Temp:      ?TempSrc:      ?SpO2: 100% 99%  100%  ?Weight:      ?Height:      ? ? ?Intake/Output Summary (Last 24 hours) at 11/29/2021 1144 ?Last data filed at 11/29/2021 0110 ?Gross per 24 hour  ?Intake 624 ml  ?Output 1375 ml  ?Net -751 ml  ? ? ?Filed Weights  ? 11/27/21 2114 11/28/21 0157  ?Weight: 64 kg 63.5 kg  ? ? ?Telemetry  ?  ?NSR  - Personally Reviewed ? ?ECG  ?  ?No new EKG to  review - Personally Reviewed ? ?Physical Exam  ? ?GEN: Well nourished, well developed in no acute distress ?HEENT: Normal ?NECK: No JVD; No carotid bruits ?LYMPHATICS: No lymphadenopathy ?CARDIAC:RRR, no rubs, gallops.  1/6 SM at RUSB ?RESPIRATORY:  Clear to auscultation without rales, wheezing or rhonchi  ?ABDOMEN: Soft, non-tender, non-distended ?MUSCULOSKELETAL:  No edema; No deformity  ?SKIN: Warm and dry ?NEUROLOGIC:  Alert and oriented x 3 ?PSYCHIATRIC:  Normal affect   ?Labs  ?  ?Chemistry ?Recent Labs  ?Lab 11/27/21 ?2116 11/28/21 ?0213 11/29/21 ?0427  ?NA 138 140 139  ?K 3.4* 3.2* 3.3*  ?CL 98 102 105  ?CO2 '27 31 26  '$ ?GLUCOSE 121* 117* 106*  ?BUN '14 14 14  '$ ?CREATININE 0.87 0.87 0.69  ?CALCIUM 9.5 9.0 8.8*  ?GFRNONAA >60 >60 >60  ?ANIONGAP '13 7 8  '$ ? ?  ? ?Hematology ?Recent Labs  ?Lab 11/27/21 ?2116 11/28/21 ?0213 11/29/21 ?0427  ?WBC 8.9 8.2 9.4  ?RBC 5.18 5.07 4.71  ?HGB 15.1 14.7 13.6  ?HCT 45.7 44.1 41.5  ?MCV  88.2 87.0 88.1  ?MCH 29.2 29.0 28.9  ?MCHC 33.0 33.3 32.8  ?RDW 12.9 12.9 13.0  ?PLT 224 213 199  ? ? ? ?Cardiac EnzymesNo results for input(s): TROPONINI in the last 168 hours. No results for input(s): TROPIPOC in the last 168 hours.  ? ?BNPNo results for input(s): BNP, PROBNP in the last 168 hours.  ? ?DDimer No results for input(s): DDIMER in the last 168 hours.  ? ?Radiology  ?  ?DG Chest 2 View ? ?Result Date: 11/27/2021 ?IMPRESSION: No active cardiopulmonary disease. Electronically Signed   By: Anner Crete M.D.   On: 11/27/2021 21:51  ? ?Cardiac Studies  ? ?LHC 11/27/2021: ?  CULPRIT LESION: Prox Cx to Mid Cx lesion is 100% stenosed with 70% stenosed side branch in 2nd Mrg. ?  A drug-eluting stent was successfully placed using a STENT ONYX FRONTIER 2.5X26.  Deployed to 2.7 mm ?  Post intervention, there is a 0% residual stenosis. Post intervention, the side branch was reduced to 60% residual stenosis. ?  ------------------------------------------------- ?  LESION SEGMENT #2: Ost LAD to  Prox LAD lesion is 75% stenosed.  Prox LAD to Mid LAD lesion is 80% stenosed. Mid LAD lesion is 99% stenosed. ?  A drug-eluting stent was successfully placed covering all 3 lesions, using a STENT ONYX FRONTIER 2.5X34.  Postdilated to 2.8 mm ?  Post intervention, there is a 0% residual stenosis. ?  ------------------------------------------------- ?  Moderately calcified very large dominant RCA with mild disease throughout. ?  ------------------------------------------------- ?  There is mild left ventricular systolic dysfunction. The left ventricular ejection fraction is 45-50% by visual estimate. -Lateral wall hypokinesis ?  LV end diastolic pressure is severely elevated. ?  There is no aortic valve stenosis. ?  ?SUMMARY ?Acute Coronary Syndrome / NSTEMI (with STEMI physiology) ?Severe two-vessel CAD :  ?Culprit lesion 100% occluded proximal and mid LCx  ?Sequential ostial proximal LAD 75, 80 to 99% stenosis ?Successful revascularization of occluded LCx with Onyx Frontier 2.5 mm x 26 mm deployed to 2.7 mm.  TIMI 0 flow improved to TIMI-3.  Also TIMI-3 flow noted in major/small caliber OM branch with an ostial 60 to 70% stenosis (not PCI during ?Successful ostial to mid LAD PCI covering the schedule lesions with an Onyx Frontier 2.5 mm x 34 mm-deployed/postdilated to 2.8 mm ?Heavily calcified but widely patent large dominant RCA that did provide collaterals to the occluded LCx ?Mildly reduced EF with lateral hypokinesis and significant elevated LVEDP of 28 mmHg ?  ?  ?RECOMMENDATIONS ?ICU admission for post PCI care. ?Very complex medication list, will need to figure out the best way to treat lipids.  He is on propranolol which was the only tolerated beta-blocker.  Marland Kitchen ?DAPT per recommendations section ?Check 2D echo ?Pending echo results, and symptoms, could potentially consider discharge in 2 to 3 days. ?__________ ? ?2D echo 11/28/2021 ?IMPRESSIONS  ? ? ? 1. Left ventricular ejection fraction, by estimation, is 50  to 55%. The  ?left ventricle has low normal function. The left ventricle demonstrates  ?regional wall motion abnormalities (select images with concern for  ?inferior and lateral wall hypokinesis).  ?Left ventricular diastolic parameters are indeterminate.  ? 2. Right ventricular systolic function is normal. The right ventricular  ?size is normal. There is normal pulmonary artery systolic pressure. The  ?estimated right ventricular systolic pressure is 90.3 mmHg.  ? 3. The mitral valve is normal in structure. Mild mitral valve  ?regurgitation. No evidence of mitral stenosis.  ?  4. The aortic valve is normal in structure. Aortic valve regurgitation is  ?mild. No aortic stenosis is present.  ? 5. The inferior vena cava is normal in size with greater than 50%  ?respiratory variability, suggesting right atrial pressure of 3 mmHg.  ? ?Patient Profile  ?   ?76 y.o. male with history of CAD with MI x 2 in 1998 and 1999 with PTCA only in [place], HTN, HLD, headaches, kidney stones, GERD, and multiple medication intolerances who was admitted to [facility] on 11/27/2021 with an NSTEMI s/p PCI/DES to the LAD and LCx.  ? ?Assessment & Plan  ?  ?1. CAD involving the native coronary arteries with NSTEMI: ?-Status post PCI/DES to the LAD and LCx as outlined above ?-he has not had any further CP ?-2D echo with EF 50-55% with inferior and lateral wall HK ?-DAPT with ASA '81mg'$  daily, high dose statin and Brilinta '90mg'$  BID without interruption for at least 12 months ?-Aggressive risk factor modification ?-Cardiac rehab ? ?2. HFpEF: ?-He appears euvolemic on exam today ?-Echo with low normal LVF EF 50-55% with inferior and lateral HK ?-Currently on PTA propranolol, as it is noted this is the only beta blocker he has tolerated ?-CHF education ? ?3. HTN: ?-BP controlled on exam today ?-PTA propranolol '40mg'$  BID and Chlorthalidone '25mg'$  daily ?-SCr stable at 0.69 today  ?-replete K+ ? ?4. HLD: ?-LDL 103 in 10/2021 ?-Started on Lipitor 80 mg upon admission  (no documented intolerance or allergy to statins) ?-PTA Zetia '10mg'$  daily ?-Goal LDL < 50 ?-Outpatient follow up ? ?5. PVCs/NSVT: ?-Replete potassium to goal 4.0 ?-Mag 2.0 ?-PTA propranolol as above ? ?6.  H

## 2021-11-29 NOTE — Progress Notes (Signed)
[facility] Progress Note ? ?Patient: Grant Young    YYQ:[id number]  DOA: 11/27/2021    ? ?Date of Service: the patient was seen and examined on 11/29/2021 ? ?Chief Complaint  ?Patient presents with  ? Chest Pain  ? ?Brief hospital course: ?Grant Young is a 76 y.o. male with medical history significant of MI in 1998, most recently evaluated with nonischemic Myoview stress test in 2017 who is being admitted to the hospitalist service status post cath for severe ACS with ongoing chest pain.  Patient was in his usual state of health until 8 PM on 11/27/2021 when he developed severe typical chest pain, precordial radiating to jaw and left arm.  According to cardiologist, Dr. Ellyn Hack, he did not meet STEMI criteria but did have persistent pain and EKG changes concerning for left main CAD.  He was taken to the ED Cath Lab where he was found to have severe two-vessel CAD with 100% occluded proximal and mid LCx and sequential ostial proximal LAD 80 to 99% stenosis.  He underwent stent placement to occluded LCx and ostial to mid LAD.  He was transferred to the ICU from the Cath Lab in stable condition.  ? ? ?Assessment and Plan: ? ?NSTEMI, h/o CAD s/p stents ?Cardiology consulted and patient had cardiac cath on 3/16 ?Status post PCI/DES to the ostial proximal LAD and proximal LCx with long stents. LVEF 45-50% by LV gram with lateral wall hypokinesis. ?-- Per cardiology: dual antiplatelet therapy with Aspirin '81mg'$  daily and Ticagrelor '90mg'$  twice daily long-term (beyond 12 months)  ?--Would consider discontinuing aspirin and continuing Thienopyridine for minimum of 1 extra year after initial 1 year. If tolerating well, would continue long-term  ?Aggressive risk factor modification ?-Cardiac rehab ?  ?Hypokalemia, potassium repleted orally.  Monitor and replete as needed. ?Hypophosphatemia, phos repleted.  Monitor and replete as needed. ?  ?Chronic migraine without aura ?resume amitriptyline on dc ?  ?Multiple drug  allergies ?Question with new meds given 26 listed allergies ?  ?Restless leg syndrome ?Pending medication verification to resume home meds ?  ?IBS (irritable bowel syndrome) ?Continue MiraLAX, Carafate, acidophilus, Bentyl as needed and  ?3/18 patient took MiraLAX and diarrhea, patient was requesting Imodium ?  ?Anxiety and depression ?Continue escitalopram and diazepam ?  ?Essential hypertension ?Hydralazine as needed and continue chlorthalidone ?  ?Hyperlipidemia LDL goal <70 ?Continue atorvastatin 80 mg p.o. daily, continue Zetia ?  ?Chronic pain syndrome ?Continue Norco as needed and morphine IV as needed for pain control ? ?  ?Spinal cord stimulator status [employer] investment [employer]) ?No acute issues ? ? ?Body mass index is 20.67 kg/m?.  ?Interventions: ?  ? ?   ?Diet: Heart healthy ?DVT Prophylaxis: Subcutaneous Lovenox  ? ?Advance goals of care discussion: Full code ? ?Family Communication: family was not present at bedside, at the time of interview.  ?The pt provided permission to discuss medical plan with the family. Opportunity was given to ask question and all questions were answered satisfactorily.  ? ?Disposition:  ?Pt is from Home, admitted with chest pain s/p PCI, still has generalized weakness, which precludes a safe discharge. ?Discharge to home, when cleared by cardiology, most likely tomorrow a.m. ? ?Subjective: No significant events overnight, patient still feels generalized weakness and tiredness, patient was advised to ambulate.  Patient stated that he had IBS diarrhea it could be because he was constipated and took MiraLAX before coming to the hospital. ?Patient was requesting Imodium. ?Seen by cardiology, plan is to ambulate today  and possible discharge tomorrow a.m. if remains asymptomatic ? ?Physical Exam: ?General:  alert oriented to time, place, and person.  ?Appear in mild distress, affect appropriate ?Eyes: PERRLA ?ENT: Oral Mucosa Clear, moist  ?Neck: no JVD,  ?Cardiovascular: S1 and S2  Present, no Murmur,  ?Respiratory: good respiratory effort, Bilateral Air entry equal and Decreased, no Crackles, no wheezes ?Abdomen: Bowel Sound present, Soft and no tenderness,  ?Skin: no rashes ?Extremities: no Pedal edema, no calf tenderness ?Neurologic: without any new focal findings ?Gait not checked due to patient safety concerns ? ?Vitals:  ? 11/29/21 1300 11/29/21 1400 11/29/21 1500 11/29/21 1600  ?BP: 120/77 118/66 104/67 101/73  ?Pulse: 69 (!) 58 64 65  ?Resp: '14 18 15 14  '$ ?Temp:      ?TempSrc:      ?SpO2: 96% 95% 96% 96%  ?Weight:      ?Height:      ? ? ?Intake/Output Summary (Last 24 hours) at 11/29/2021 1629 ?Last data filed at 11/29/2021 0110 ?Gross per 24 hour  ?Intake 240 ml  ?Output 875 ml  ?Net -635 ml  ? ?Filed Weights  ? 11/27/21 2114 11/28/21 0157  ?Weight: 64 kg 63.5 kg  ? ? ?Data Reviewed: ?I have personally reviewed and interpreted daily labs, tele strips, imagings as discussed above. ?I reviewed all nursing notes, pharmacy notes, vitals, pertinent old records ?I have discussed plan of care as described above with RN and patient/family. ? ?CBC: ?Recent Labs  ?Lab 11/27/21 ?2116 11/28/21 ?0213 11/29/21 ?0427  ?WBC 8.9 8.2 9.4  ?HGB 15.1 14.7 13.6  ?HCT 45.7 44.1 41.5  ?MCV 88.2 87.0 88.1  ?PLT 224 213 199  ? ?Basic Metabolic Panel: ?Recent Labs  ?Lab 11/27/21 ?2116 11/28/21 ?0213 11/29/21 ?0427  ?NA 138 140 139  ?K 3.4* 3.2* 3.3*  ?CL 98 102 105  ?CO2 '27 31 26  '$ ?GLUCOSE 121* 117* 106*  ?BUN '14 14 14  '$ ?CREATININE 0.87 0.87 0.69  ?CALCIUM 9.5 9.0 8.8*  ?MG  --  2.0  --   ?PHOS  --   --  2.3*  ? ? ?Studies: ?No results found.  ?Scheduled Meds: ? acidophilus  1 capsule Oral Daily  ? aspirin  81 mg Oral Daily  ? atorvastatin  80 mg Oral Daily  ? Chlorhexidine Gluconate Cloth  6 each Topical Q0600  ? chlorthalidone  25 mg Oral Daily  ? diazepam  10 mg Oral QHS  ? diazepam  5 mg Oral q morning  ? dicyclomine  20 mg Oral TID AC & HS  ? [START ON 11/30/2021] enoxaparin (LOVENOX) injection  40 mg  Subcutaneous QHS  ? escitalopram  20 mg Oral Daily  ? ezetimibe  10 mg Oral Daily  ? loratadine  10 mg Oral Daily  ? melatonin  5 mg Oral QHS  ? polyethylene glycol  17 g Oral Daily  ? propranolol  40 mg Oral BID  ? pyridostigmine  30 mg Oral BID  ? sodium chloride flush  3 mL Intravenous Q12H  ? sucralfate  1 g Oral TID WC & HS  ? ticagrelor  90 mg Oral BID  ? Vibegron  75 mg Oral Daily  ? ?Continuous Infusions: ? sodium chloride    ? ?PRN Meds: sodium chloride, acetaminophen, bisacodyl, HYDROcodone-acetaminophen, loperamide, morphine injection, nitroGLYCERIN, ondansetron (ZOFRAN) IV, ondansetron, polyethylene glycol, sodium chloride flush ? ?Time spent: 35 minutes ? ?Author: ?Val Riles MD ?[facility] ?11/29/2021 4:29 PM ? ?To reach On-call, see care teams to locate  the attending and reach out to them via [email or web address]. ?If 7PM-7AM, please contact night-coverage ?If you still have difficulty reaching the attending provider, please page the [facility] (Director on Call) for [facility] on amion for assistance. ? ?

## 2021-11-29 NOTE — Progress Notes (Signed)
Shift Summary: ?Patient A/O x 4 entire shift. Ambulated with one assist to bathroom several times, gait very unsteady. UOP adequate. BM x2 post Miralax administration. VSS. No complaints of chest pain. PIVs SL. Awaiting K+ replacement orders. No acute events during this shift.  ?

## 2021-11-30 DIAGNOSIS — I214 Non-ST elevation (NSTEMI) myocardial infarction: Secondary | ICD-10-CM | POA: Diagnosis not present

## 2021-11-30 LAB — BASIC METABOLIC PANEL
Anion gap: 3 — ABNORMAL LOW (ref 5–15)
BUN: 18 mg/dL (ref 8–23)
CO2: 32 mmol/L (ref 22–32)
Calcium: 8.7 mg/dL — ABNORMAL LOW (ref 8.9–10.3)
Chloride: 107 mmol/L (ref 98–111)
Creatinine, Ser: 0.66 mg/dL (ref 0.61–1.24)
GFR, Estimated: >60 mL/min (ref >60)
Glucose, Bld: 100 mg/dL — ABNORMAL HIGH (ref 70–99)
Potassium: 3.4 mmol/L — ABNORMAL LOW (ref 3.5–5.1)
Sodium: 142 mmol/L (ref 135–145)

## 2021-11-30 LAB — CBC
HCT: 41.1 % (ref 39.0–52.0)
Hemoglobin: 13.5 g/dL (ref 13.0–17.0)
MCH: 29.3 pg (ref 26.0–34.0)
MCHC: 32.8 g/dL (ref 30.0–36.0)
MCV: 89.3 fL (ref 80.0–100.0)
Platelets: 179 10*3/uL (ref 150–400)
RBC: 4.6 MIL/uL (ref 4.22–5.81)
RDW: 13.3 % (ref 11.5–15.5)
WBC: 7.2 10*3/uL (ref 4.0–10.5)
nRBC: 0 % (ref 0.0–0.2)

## 2021-11-30 LAB — PHOSPHORUS: Phosphorus: 3.6 mg/dL (ref 2.5–4.6)

## 2021-11-30 MED ORDER — TICAGRELOR 90 MG PO TABS: 90.0000 mg | ORAL_TABLET | Freq: Two times a day (BID) | ORAL | 3 refills | Status: DC

## 2021-11-30 MED ORDER — POTASSIUM CHLORIDE CRYS ER 20 MEQ PO TBCR
40.0000 meq | EXTENDED_RELEASE_TABLET | Freq: Once | ORAL | Status: AC
  Administered 2021-11-30: 40 meq via ORAL
  Filled 2021-11-30: qty 2

## 2021-11-30 MED ORDER — ATORVASTATIN CALCIUM 80 MG PO TABS
80.0000 mg | ORAL_TABLET | Freq: Every day | ORAL | 3 refills | Status: DC
Start: 2021-12-01 — End: 2021-12-26

## 2021-11-30 MED ORDER — ASPIRIN 81 MG PO CHEW: 81.0000 mg | CHEWABLE_TABLET | Freq: Every day | ORAL | 3 refills | Status: AC

## 2021-11-30 NOTE — Plan of Care (Signed)

## 2021-11-30 NOTE — Evaluation (Signed)
Physical Therapy Evaluation ?Patient Details ?Name: Grant Young ?MRN: [id number] ?DOB: Sep 16, 1945 ?Today's Date: 11/30/2021 ? ?History of Present Illness ? presented to ER secondary to acute onset of chest pain with radiation to L UE, jaw; admitted for management of CAD with atypical chest pain, s/p cardiac cath with stent to LCx, ostial-mid LAD (via R radial artery)  ?Clinical Impression ? Patient alert and oriented; follows commands and agreeable to participation with session.  Eager for upcoming discharge home.  Denies pain at this time; endorses full resolution of chest pain.  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  ABle to complete bed mobility with close sup; sit/stand, basic transfers and gait (15') without assist device, min assist.  Demonstrates decreased step height/lenght, very guarded trunk posturing with limited arm sway; slow and deliberate; limited balance reactions evident ?Additional gait trial completed with RW (80' with RW, close sup); noted improvement in gait mechanics, overall quality, fluidity and safety with RW.  Do recommend continued use of RW at this time; patient aware and in agreement. ?Vitals stable and WFL with mobility tasks.  No chest pain, pressure reported.  HR 48-52 at rest; peaked at 83 with gait.  BP stable and WFL; no orthostasis or subjective symptoms reported. ?Did review precautions to R UE s/p radial heart cath (and emphasized importance of balance only on RW); patient voiced understanding. ?Would benefit from skilled PT to address above deficits and promote optimal return to PLOF.; Recommend transition to [place] upon discharge from acute hospitalization.'   ?   ? ?Recommendations for follow up therapy are one component of a multi-disciplinary discharge planning process, led by the attending physician.  Recommendations may be updated based on  patient status, additional functional criteria and insurance authorization. ? ?Follow Up Recommendations Home health PT ? ?  ?Assistance Recommended at Discharge PRN  ?Patient can return home with the following ? A little help with walking and/or transfers;A little help with bathing/dressing/bathroom ? ?  ?Equipment Recommendations Rolling walker (2 wheels)  ?Recommendations for Other Services ?    ?  ?Functional Status Assessment Patient has had a recent decline in their functional status and demonstrates the ability to make significant improvements in function in a reasonable and predictable amount of time.  ? ?  ?Precautions / Restrictions Precautions ?Precautions: Fall ?Precaution Comments: s/p R radial artery cath ?Restrictions ?Weight Bearing Restrictions: No  ? ?  ? ?Mobility ? Bed Mobility ?Overal bed mobility: Needs Assistance ?Bed Mobility: Supine to Sit ?  ?  ?Supine to sit: Supervision ?  ?  ?  ?  ? ?Transfers ?Overall transfer level: Needs assistance ?  ?Transfers: Sit to/from Stand ?Sit to Stand: Min guard ?  ?  ?  ?  ?  ?General transfer comment: performed with and without RW, cga/min assist each time. Mild sway in A/P plane, occasional assist to prevent posterior LOB with unsupported standing. ?  ? ?Ambulation/Gait ?Ambulation/Gait assistance: Min assist ?Gait Distance (Feet): 15 Feet ?Assistive device: None ?  ?  ?  ?  ?General Gait Details: decreased step height/lenght, very guarded trunk posturing with limited arm sway; slow and deliberate; limited balance reactions evident ? ?Stairs ?  ?  ?  ?  ?  ? ?Wheelchair Mobility ?  ? ?Modified Rankin (Stroke Patients Only) ?  ? ?  ? ?Balance Overall balance assessment: Needs assistance ?Sitting-balance support: No upper  extremity supported, Feet supported ?Sitting balance-Leahy Scale: Good ?  ?  ?Standing balance support: No upper extremity supported ?Standing balance-Leahy Scale: Fair ?  ?  ?  ?  ?  ?  ?  ?  ?  ?  ?  ?  ?   ? ? ? ?Pertinent Vitals/Pain  Pain Assessment ?Pain Assessment: No/denies pain  ? ? ?Home Living Family/patient expects to be discharged to:: Private residence ?Living Arrangements: Spouse/significant other ?Available Help at Discharge: Family;Available PRN/intermittently ?Type of Home: House ?Home Access: Stairs to enter ?Entrance Stairs-Rails: Right ?Entrance Stairs-Number of Steps: 1 ?  ?Home Layout: One level ?Home Equipment: None ?Additional Comments: Resides at [facility]  ?  ?Prior Function Prior Level of Function : Independent/Modified Independent ?  ?  ?  ?  ?  ?  ?Mobility Comments: Mod indep without assist device for ADLs, household and community mobilization; denies fall history ?  ?  ? ? ?Hand Dominance  ?   ? ?  ?Extremity/Trunk Assessment  ? Upper Extremity Assessment ?Upper Extremity Assessment: Overall WFL for tasks assessed ?  ? ?Lower Extremity Assessment ?Lower Extremity Assessment: Overall WFL for tasks assessed ?  ? ?   ?Communication  ? Communication: No difficulties  ?Cognition Arousal/Alertness: Awake/alert ?Behavior During Therapy: [facility] for tasks assessed/performed ?Overall Cognitive Status: Within Functional Limits for tasks assessed ?  ?  ?  ?  ?  ?  ?  ?  ?  ?  ?  ?  ?  ?  ?  ?  ?  ?  ?  ? ?  ?General Comments   ? ?  ?Exercises Other Exercises ?Other Exercises: Reviewed role of PT and progressive mobility; reviewed precautions for R UE s/p radial heart cath; reviewed safety/equipment use with transfers and mobiltiy.  Patient voiced understanding of all information. ?Other Exercises: 38' with RW, cga--improved gait mechanics, fluidity and overall safety; do recommend continued use of RW for all gait at this time.  ? ?Assessment/Plan  ?  ?PT Assessment Patient needs continued PT services  ?PT Problem List Decreased strength;Decreased activity tolerance;Decreased balance;Decreased mobility;Decreased knowledge of use of DME;Decreased safety awareness;Decreased knowledge of precautions ? ?   ?  ?PT  Treatment Interventions DME instruction;Gait training;Functional mobility training;Therapeutic activities;Therapeutic exercise;Balance training;Patient/family education   ? ?PT Goals (Current goals can be found in the Care Plan section)  ?Acute Rehab PT Goals ?Patient Stated Goal: to return home ?PT Goal Formulation: With patient ?Time For Goal Achievement: 12/14/21 ?Potential to Achieve Goals: Good ? ?  ?Frequency Min 2X/week ?  ? ? ?Co-evaluation   ?  ?  ?  ?  ? ? ?  ?AM-PAC PT "6 Clicks" Mobility  ?Outcome Measure Help needed turning from your back to your side while in a flat bed without using bedrails?: None ?Help needed moving from lying on your back to sitting on the side of a flat bed without using bedrails?: None ?Help needed moving to and from a bed to a chair (including a wheelchair)?: A Little ?Help needed standing up from a chair using your arms (e.g., wheelchair or bedside chair)?: A Little ?Help needed to walk in hospital room?: A Little ?Help needed climbing 3-5 steps with a railing? : A Little ?6 Click Score: 20 ? ?  ?End of Session Equipment Utilized During Treatment: Gait belt ?Activity Tolerance: Patient tolerated treatment well ?Patient left: in chair;with call bell/phone within reach;with family/visitor present ?Nurse Communication: Mobility status ?PT Visit Diagnosis:  Muscle weakness (generalized) (M62.81);Difficulty in walking, not elsewhere classified (R26.2) ?  ? ?Time: 1234-1300 ?PT Time Calculation (min) (ACUTE ONLY): 26 min ? ? ?Charges:   PT Evaluation ?$PT Eval Moderate Complexity: 1 Mod ?PT Treatments ?$Gait Training: 8-22 mins ?  ?   ? ? ?Jovee Dettinger H. Owens Shark, PT, DPT, NCS ?11/30/21, 1:14 PM ?[phone] ? ? ?

## 2021-11-30 NOTE — Discharge Summary (Signed)
[facility] Discharge Summary ? ? ?Patient: Grant Young QTM:[id number]  PCP: [name], NP  ?Date of admission: 11/27/2021   Date of discharge:  11/30/2021   ?  ?Discharge Diagnoses:  ?Principal Problem: ?  Non-ST elevation (NSTEMI) myocardial infarction [facility]) ?Active Problems: ?  Acute coronary syndrome with high troponin ([place]) ?  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(HCC) ?  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  Chronic pain syndrome ?  Hyperlipidemia LDL goal <70 ?  Essential hypertension ?  Anxiety and depression ?  IBS (irritable bowel syndrome) ?  OAB (overactive bladder) ?  Restless leg syndrome ?  Multiple drug allergies ?  Chronic migraine without aura ? ? ?Admitted From: Home ?Disposition:  Home with HHPT ? ?Recommendations for Outpatient Follow-up:  ?PCP:  in 1 to 2 weeks, continue to monitor BP at home ?Follow with cardiologist in few weeks, repeat fasting lipid profile and LFTs in 6 weeks as per cardiology, order placed for future labs ?Follow up LABS/TEST:  FLP and LFTs ? ? ?Diet recommendation: Cardiac diet ? ?Activity: The patient is advised to gradually reintroduce usual activities, as tolerated ? ?Discharge Condition: stable ? ?Code Status: Full code  ? ?History of present illness: As per the H and P dictated on admission ? ?Hospital Course:  ?Grant Young is a 76 y.o. male with medical history significant of MI in 1998, most recently evaluated with nonischemic Myoview stress test in 2017 who is being admitted to the hospitalist service status post cath for severe ACS with ongoing chest pain.  Patient was in his usual state of health until 8 PM on 11/27/2021 when he developed severe typical chest pain, precordial radiating to jaw and left arm.  According to cardiologist, Dr. Ellyn Hack, he did not meet STEMI criteria but did have persistent pain and EKG changes concerning for left main CAD.  He was  taken to the ED Cath Lab where he was found to have severe two-vessel CAD with 100% occluded proximal and mid LCx and sequential ostial proximal LAD 80 to 99% stenosis.  He underwent stent placement to occluded LCx and ostial to mid LAD.  He was transferred to the ICU from the Cath Lab in stable condition.  ?  ?Assessment and Plan: ?NSTEMI, h/o CAD s/p stents, Cardiology consulted and patient had cardiac cath on 3/16 ?Status post PCI/DES to the ostial proximal LAD and proximal LCx with long stents. LVEF 45-50% by LV gram with lateral wall hypokinesis. ?Per cardiology: dual antiplatelet therapy with Aspirin '81mg'$  daily and Ticagrelor '90mg'$  twice daily long-term (beyond 12 months), Would consider discontinuing aspirin and continuing Thienopyridine for minimum of 1 extra year after initial 1 year. If tolerating well, would continue long-term. Aggressive risk factor modification, Cardiac rehab.  Recommended Lipitor 80 mg p.o. daily, continued chlorthalidone 25 home dose, Zetia 10 mg, propanolol 40 mg p.o. twice daily and continued potassium supplement.  ?Hypokalemia, potassium repleted orally. Resolved ?Hypophosphatemia, phos repleted.  Resolved  ?Chronic migraine without aura, resume amitriptyline on dc ?Multiple drug allergies, Question with new meds given 26 listed allergies, follow-up with PCP if any issues in future ?Restless leg syndrome, resumed home medications.  Follow with PCP. ?IBS (irritable bowel syndrome) ?Continue MiraLAX, Carafate, acidophilus, Bentyl as needed and  ?3/18 patient took MiraLAX and diarrhea, patient was requesting Imodium ?Anxiety and depression, Continue escitalopram and diazepam ?Essential hypertension, resumed home medications.  Patient was advised to monitor  BP at home and follow with PCP and cardiology  ?Hyperlipidemia LDL goal <70, Continue atorvastatin 80 mg p.o. 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  ?Body mass index is 20.67 kg/m?Marland Kitchen  ?Nutrition Interventions: ?  ? ?- Patient was instructed, not to drive, operate heavy machinery, perform activities at heights, swimming or participation in water activities or provide baby sitting services while on Pain, Sleep and Anxiety Medications; until his outpatient Physician has advised to do so again.  ?- Also recommended to not to take more than prescribed Pain, Sleep and Anxiety Medications. ? ?Patient was seen by physical therapy, who recommended Home health, which was arranged. ?On the day of the discharge the patient's vitals were stable, and no other acute medical condition were reported by patient. the patient was felt safe to be discharge at Home with Home health. ? ?Consultants: Cardio ?Procedures: s/p Cardiac Cath ? ?Discharge Exam: ?General: Appear in no distress, no Rash; Oral Mucosa Clear, moist. ?Cardiovascular: S1 and S2 Present, no Murmur, ?Respiratory: normal respiratory effort, Bilateral Air entry present and no Crackles, no wheezes ?Abdomen: Bowel Sound present, Soft and no tenderness, no hernia ?Extremities: no Pedal edema, no calf tenderness ?Neurology: alert and oriented to time, place, and person ?affect appropriate. ? ?Filed Weights  ? 11/27/21 2114 11/28/21 0157  ?Weight: 64 kg 63.5 kg  ? ?Vitals:  ? 11/30/21 1000 11/30/21 1100  ?BP:    ?Pulse:    ?Resp: 13 10  ?Temp:    ?SpO2:    ? ? ?DISCHARGE MEDICATION: ?Allergies as of 11/30/2021   ? ?   Reactions  ? Ace Inhibitors   ? Other reaction(s): Cough  ? Fluoxetine Anxiety  ? "made me fall asleep" per pt ?"bad headaches and "makes  Me  Crazy" ?historical allergy noted in [employer] ?"made me fall asleep" per pt ?"bad headaches and "makes  Me  Crazy" ?Per New Patient Packet.   ? Metoclopramide   ? Other reaction(s): Other (See Comments), Other (See Comments), Unknown ?Tardive Dyskinesia  ?historical allergy noted in [employer] ?Tardive Dyskinesia  ?Per New Patient Packet.  ? Nalbuphine   ?  Used Post Back surgery- Anesthesiologist Error. Patient had Narcotic Withdraw. Per New Patient Packet.   ? Other   ? Other reaction(s): Other (See Comments) ?Altered mental status in combo with narcotics at previous hospitalization - Full Withdrawal Symptoms ?Other reaction(s): Rash  ? Amoxicillin-pot Clavulanate Nausea Only  ? Per New Patient Packet.  ? Doxazosin Rash  ? Other reaction(s): Other - See Comments, Rash ?UNKNOWN REACTION ?UNKNOWN REACTION  ? Duloxetine Nausea Only  ? Per New Patient Packet.   ? Penicillins Nausea Only  ? Per New Patient Packet.  ? Tamsulosin Itching, Anxiety  ? Restless, Flushing, Heavy Chest, Itching, Hyperactive mood and Anxiety. Unable to handle side effects. Per New Patient Packet.   ? Trazodone And Nefazodone Itching, Anxiety, Rash  ? Headache. ?"INCREASED MY ANXIETY AND HEARTRATE" ?Flushing, tachycardia ?"INCREASED MY ANXIETY AND HEARTRATE" ?Per New Patient Packet.  ? Amlodipine   ? Shaking, unsure of reaction. Per New Patient Packet.   ? Cinoxacin   ? GI Intolerance, and Dizziness. Per New Patient Packet.   ? Ciprofloxacin   ? Other reaction(s): Unknown  ? Fludrocortisone Other (See Comments)  ? "Worsening headaches, GI issues, fatigue"  ? Nebivolol   ? Other reaction(s): Unknown  ? Olanzapine   ? Headache and unable to sleep for  3 nights. Per New Patient Packet.  ? Olmesartan   ? Other reaction(s): Unknown  ? Pregabalin   ? Confusion, Lack of concentration, dizziness, and likely drowsiness. Per New Patient Packet.   ? Prostaglandins   ? Other reaction(s): Other (See Comments) ?Intolerance  ? Thyroid Hormones   ? Other reaction(s): Other (See Comments) ?Thyroid (Nature Thyroid) contraindicated with some of your other medications.  ? Zolpidem   ? Nightmares, Ineffective after 2 days. Per New Patient Packet.   ? Duloxetine Hcl   ? Other reaction(s): Rash  ? Fluoxetine Hcl   ? Other reaction(s): Rash  ? Nucynta [tapentadol] Other (See Comments)  ? Vertigo   ? Phenytoin Anxiety  ?  Hyperactivity, and Ineffective. Per New Patient Packet.   ? ?  ? ?  ?Medication List  ?  ? ?STOP taking these medications   ? ?HYDROcodone Bitartrate ER 10 MG Cp12 ?  ? ?  ? ?TAKE these medications

## 2021-11-30 NOTE — TOC Progression Note (Addendum)
Transition of Care (TOC) - Progression Note  ? ? ?Patient Details  ?Name: CHAKA JEFFERYS ?MRN: [id number] ?Date of Birth: 1946-02-05 ? ?Transition of Care (TOC) CM/SW Contact  ?El Cenizo, LCSWA ?Phone Number: ?11/30/2021, 1:30 PM ? ?Clinical Narrative:    ? ?CSW spoke to patient's wife Bette to learn [facility] preferences. Bette asked about role of TOC and CSW explained. Bette reported no preferences in agencies. CSW provided [facility] weekday staff contact info if she has any questions after today. CSW spoke with Jasmine at [facility] and she reported she would start process for getting patient rolling walker. CSW reached out to [name] from [place] for HHPT and she confirmed accepting patient.  ? ?No further TOC needs. ? ?Expected Discharge Plan: Home/Self Care ?Barriers to Discharge: Continued Medical Work up ? ?Expected Discharge Plan and Services ?Expected Discharge Plan: Home/Self Care ?  ?  ?  ?  ?Expected Discharge Date: 11/30/21               ?  ?  ?  ?  ?  ?  ?  ?  ?  ?  ? ? ?Social Determinants of Health (SDOH) Interventions ?  ? ?Readmission Risk Interventions ?No flowsheet data found. ? ?

## 2021-11-30 NOTE — Progress Notes (Signed)
Pt discharged per MD order, AVS reviewed with pt and pt's wife at bedside. Questions answered. Education provided regarding medications and follow up care. Pt transported to main entrance.  ?

## 2021-11-30 NOTE — Progress Notes (Signed)
? ? ?Progress Note ? ?Patient Name: Grant Young ?Date of Encounter: 11/30/2021 ? ?Primary Cardiologist: Agbor-Etang ? ?Subjective  ? ?Status post PCI/DES to the LAD and LCx 3/16. LVEF 45-50% by LV gram with lateral wall hypokinesis. ? ?He denies any chest pain or shortness of breath today ? ?Inpatient Medications  ?  ?Scheduled Meds: ? acidophilus  1 capsule Oral Daily  ? aspirin  81 mg Oral Daily  ? atorvastatin  80 mg Oral Daily  ? Chlorhexidine Gluconate Cloth  6 each Topical Q0600  ? chlorthalidone  25 mg Oral Daily  ? diazepam  10 mg Oral QHS  ? diazepam  5 mg Oral q morning  ? dicyclomine  20 mg Oral TID AC & HS  ? enoxaparin (LOVENOX) injection  40 mg Subcutaneous QHS  ? escitalopram  20 mg Oral Daily  ? ezetimibe  10 mg Oral Daily  ? loratadine  10 mg Oral Daily  ? melatonin  5 mg Oral QHS  ? polyethylene glycol  17 g Oral Daily  ? potassium chloride  40 mEq Oral Once  ? propranolol  40 mg Oral BID  ? pyridostigmine  30 mg Oral BID  ? sodium chloride flush  3 mL Intravenous Q12H  ? sucralfate  1 g Oral TID WC & HS  ? ticagrelor  90 mg Oral BID  ? ?Continuous Infusions: ? sodium chloride    ? ?PRN Meds: ?sodium chloride, acetaminophen, bisacodyl, HYDROcodone-acetaminophen, loperamide, morphine injection, nitroGLYCERIN, ondansetron (ZOFRAN) IV, ondansetron, polyethylene glycol, sodium chloride flush  ? ?Vital Signs  ?  ?Vitals:  ? 11/29/21 2200 11/29/21 2300 11/30/21 0000 11/30/21 0827  ?BP: 102/66 115/67 116/65 (!) 144/112  ?Pulse:    67  ?Resp: '16 17 16 18  '$ ?Temp:    98.1 ?F (36.7 ?C)  ?TempSrc:    Oral  ?SpO2:    98%  ?Weight:      ?Height:      ? ?No intake or output data in the 24 hours ending 11/30/21 1039 ? ?Filed Weights  ? 11/27/21 2114 11/28/21 0157  ?Weight: 64 kg 63.5 kg  ? ? ?Telemetry  ?  ?NSR  - Personally Reviewed ? ?ECG  ?  ?No new EKG to review - Personally Reviewed ? ?Physical Exam  ? ?GEN: Well nourished, well developed in no acute distress ?HEENT: Normal ?NECK: No JVD; No carotid  bruits ?LYMPHATICS: No lymphadenopathy ?CARDIAC:RRR, no rubs, gallops.  1/6 SM at RUSB ?RESPIRATORY:  Clear to auscultation without rales, wheezing or rhonchi  ?ABDOMEN: Soft, non-tender, non-distended ?MUSCULOSKELETAL:  No edema; No deformity  ?SKIN: Warm and dry ?NEUROLOGIC:  Alert and oriented x 3 ?PSYCHIATRIC:  Normal affect   ?Labs  ?  ?Chemistry ?Recent Labs  ?Lab 11/28/21 ?0213 11/29/21 ?7517 11/30/21 ?0415  ?NA 140 139 142  ?K 3.2* 3.3* 3.4*  ?CL 102 105 107  ?CO2 31 26 32  ?GLUCOSE 117* 106* 100*  ?BUN '14 14 18  '$ ?CREATININE 0.87 0.69 0.66  ?CALCIUM 9.0 8.8* 8.7*  ?GFRNONAA >60 >60 >60  ?ANIONGAP 7 8 3*  ? ?  ? ?Hematology ?Recent Labs  ?Lab 11/28/21 ?0213 11/29/21 ?0017 11/30/21 ?0415  ?WBC 8.2 9.4 7.2  ?RBC 5.07 4.71 4.60  ?HGB 14.7 13.6 13.5  ?HCT 44.1 41.5 41.1  ?MCV 87.0 88.1 89.3  ?MCH 29.0 28.9 29.3  ?MCHC 33.3 32.8 32.8  ?RDW 12.9 13.0 13.3  ?PLT 213 199 179  ? ? ? ?Cardiac EnzymesNo results for input(s): TROPONINI in the  last 168 hours. No results for input(s): TROPIPOC in the last 168 hours.  ? ?BNPNo results for input(s): BNP, PROBNP in the last 168 hours.  ? ?DDimer No results for input(s): DDIMER in the last 168 hours.  ? ?Radiology  ?  ?DG Chest 2 View ? ?Result Date: 11/27/2021 ?IMPRESSION: No active cardiopulmonary disease. Electronically Signed   By: Anner Crete M.D.   On: 11/27/2021 21:51  ? ?Cardiac Studies  ? ?LHC 11/27/2021: ?  CULPRIT LESION: Prox Cx to Mid Cx lesion is 100% stenosed with 70% stenosed side branch in 2nd Mrg. ?  A drug-eluting stent was successfully placed using a STENT ONYX FRONTIER 2.5X26.  Deployed to 2.7 mm ?  Post intervention, there is a 0% residual stenosis. Post intervention, the side branch was reduced to 60% residual stenosis. ?  ------------------------------------------------- ?  LESION SEGMENT #2: Ost LAD to Prox LAD lesion is 75% stenosed.  Prox LAD to Mid LAD lesion is 80% stenosed. Mid LAD lesion is 99% stenosed. ?  A drug-eluting stent was  successfully placed covering all 3 lesions, using a STENT ONYX FRONTIER 2.5X34.  Postdilated to 2.8 mm ?  Post intervention, there is a 0% residual stenosis. ?  ------------------------------------------------- ?  Moderately calcified very large dominant RCA with mild disease throughout. ?  ------------------------------------------------- ?  There is mild left ventricular systolic dysfunction. The left ventricular ejection fraction is 45-50% by visual estimate. -Lateral wall hypokinesis ?  LV end diastolic pressure is severely elevated. ?  There is no aortic valve stenosis. ?  ?SUMMARY ?Acute Coronary Syndrome / NSTEMI (with STEMI physiology) ?Severe two-vessel CAD :  ?Culprit lesion 100% occluded proximal and mid LCx  ?Sequential ostial proximal LAD 75, 80 to 99% stenosis ?Successful revascularization of occluded LCx with Onyx Frontier 2.5 mm x 26 mm deployed to 2.7 mm.  TIMI 0 flow improved to TIMI-3.  Also TIMI-3 flow noted in major/small caliber OM branch with an ostial 60 to 70% stenosis (not PCI during ?Successful ostial to mid LAD PCI covering the schedule lesions with an Onyx Frontier 2.5 mm x 34 mm-deployed/postdilated to 2.8 mm ?Heavily calcified but widely patent large dominant RCA that did provide collaterals to the occluded LCx ?Mildly reduced EF with lateral hypokinesis and significant elevated LVEDP of 28 mmHg ?  ?  ?RECOMMENDATIONS ?ICU admission for post PCI care. ?Very complex medication list, will need to figure out the best way to treat lipids.  He is on propranolol which was the only tolerated beta-blocker.  Marland Kitchen ?DAPT per recommendations section ?Check 2D echo ?Pending echo results, and symptoms, could potentially consider discharge in 2 to 3 days. ?__________ ? ?2D echo 11/28/2021 ?IMPRESSIONS  ? ? ? 1. Left ventricular ejection fraction, by estimation, is 50 to 55%. The  ?left ventricle has low normal function. The left ventricle demonstrates  ?regional wall motion abnormalities (select images  with concern for  ?inferior and lateral wall hypokinesis).  ?Left ventricular diastolic parameters are indeterminate.  ? 2. Right ventricular systolic function is normal. The right ventricular  ?size is normal. There is normal pulmonary artery systolic pressure. The  ?estimated right ventricular systolic pressure is 40.3 mmHg.  ? 3. The mitral valve is normal in structure. Mild mitral valve  ?regurgitation. No evidence of mitral stenosis.  ? 4. The aortic valve is normal in structure. Aortic valve regurgitation is  ?mild. No aortic stenosis is present.  ? 5. The inferior vena cava is normal in size with greater than 50%  ?  respiratory variability, suggesting right atrial pressure of 3 mmHg.  ? ?Patient Profile  ?   ?76 y.o. male with history of CAD with MI x 2 in 1998 and 1999 with PTCA only in [place], HTN, HLD, headaches, kidney stones, GERD, and multiple medication intolerances who was admitted to [facility] on 11/27/2021 with an NSTEMI s/p PCI/DES to the LAD and LCx.  ? ?Assessment & Plan  ?  ?1. CAD involving the native coronary arteries with NSTEMI: ?-Status post PCI/DES to the LAD and LCx as outlined above ?-He has not had any further anginal chest pain, he is hemodynamically stable and has had no arrhythmias on telemetry ?-2D echo with EF 50-55% with inferior and lateral wall HK ?-Continue DAPT with aspirin 81 mg daily Brilinta 90 mg twice daily without interruption for at least 12 months ?-Continue atorvastatin 80 mg daily and Zetia 10 mg daily ?-Continue propranolol 40 mg twice daily (this is the only beta-blocker he has tolerated) ?-Aggressive risk factor modification ?-Cardiac rehab ? ?2. HFpEF: ?-He does not appear volume overloaded on exam today ?-Echo with low normal LVF EF 50-55% with inferior and lateral HK ?-Continue propranolol 40 mg twice daily and chlorthalidone 25 mg daily ?-CHF education ? ?3. HTN: ?-BP is adequately controlled on exam ?-Continue propranolol 40 mg twice daily and chlorthalidone 25 mg  daily ?-SCr stable at 0.66 today  ? ?4. HLD: ?-LDL 103 in 10/2021 ?-Started on Lipitor 80 mg upon admission (no documented intolerance or allergy to statins) ?-PTA Zetia '10mg'$  daily ?-Goal LDL < 50 ?-Outpatient follow up with

## 2021-12-01 ENCOUNTER — Telehealth: Payer: Self-pay

## 2021-12-01 NOTE — Telephone Encounter (Signed)
LVM to return call- has appt w/ Rollene Fare on 4/4 @ 8:30am ?

## 2021-12-02 ENCOUNTER — Other Ambulatory Visit: Payer: Self-pay | Admitting: Internal Medicine

## 2021-12-02 ENCOUNTER — Telehealth: Payer: Self-pay | Admitting: Internal Medicine

## 2021-12-02 NOTE — Telephone Encounter (Signed)
Copied from [place] [id number]. Topic: Quick Communication - Home Health Verbal Orders ?>> Dec 02, 2021  9:53 AM Yvette Rack wrote: ?Caller/Agency: Malachy Mood with [facility]  ?Callback Number: [phone]  ?Requesting OT/PT/Skilled Nursing/Social Work/Speech Therapy: nursing evaluation ?Frequency: ?

## 2021-12-02 NOTE — Telephone Encounter (Signed)
[place] for nursing evaluation ?

## 2021-12-02 NOTE — Telephone Encounter (Signed)
Cheryl advised.   Thanks,   -Kyomi Hector  

## 2021-12-03 ENCOUNTER — Ambulatory Visit: Payer: Medicare Other | Admitting: Urology

## 2021-12-03 LAB — GLUCOSE, CAPILLARY: Glucose-Capillary: 104 mg/dL — ABNORMAL HIGH (ref 70–99)

## 2021-12-03 NOTE — Telephone Encounter (Signed)
Discontinued on 08/25/21 per MyChart encounter 08/24/21, switched to another medication. Will refuse this request. ? ?Requested Prescriptions  ?Pending Prescriptions Disp Refills  ?? fludrocortisone (FLORINEF) 0.1 MG tablet [Pharmacy Med Name: FLUDROCORTISONE 0.1 MG TABLET] 90 tablet 0  ?  Sig: TAKE 1 TABLET BY MOUTH EVERY DAY  ?  ? Not Delegated - Endocrinology: Oral Corticosteroids - fludrocortisone Failed - 12/02/2021 10:12 AM  ?  ?  Failed - This refill cannot be delegated  ?  ?  Failed - Manual Review: Eye exam for IOP if prolonged treatment  ?  ?  Failed - K in normal range and within 180 days  ?  Potassium  ?Date Value Ref Range Status  ?11/30/2021 3.4 (L) 3.5 - 5.1 mmol/L Final  ?   ?  ?  Failed - Glucose (serum) in normal range and within 180 days  ?  Glucose  ?Date Value Ref Range Status  ?05/20/2020 94  Final  ? ?Glucose, Bld  ?Date Value Ref Range Status  ?11/30/2021 100 (H) 70 - 99 mg/dL Final  ?  Comment:  ?  Glucose reference range applies only to samples taken after fasting for at least 8 hours.  ? ?Glucose-Capillary  ?Date Value Ref Range Status  ?11/28/2021 104 (H) 70 - 99 mg/dL Final  ?  Comment:  ?  Glucose reference range applies only to samples taken after fasting for at least 8 hours.  ?   ?  ?  Failed - Last BP in normal range  ?  BP Readings from Last 1 Encounters:  ?11/30/21 (!) 144/112  ?   ?  ?  Failed - Bone Mineral Density or Dexa Scan completed in the last 2 years  ?  ?  Passed - Na in normal range and within 180 days  ?  Sodium  ?Date Value Ref Range Status  ?11/30/2021 142 135 - 145 mmol/L Final  ?05/20/2020 145 137 - 147 Final  ?   ?  ?  Passed - Valid encounter within last 6 months  ?  Recent Outpatient Visits   ?      ? 3 weeks ago Excessive daytime sleepiness  ? [facility] [place], [place] W, NP  ? 3 months ago Anxiety and depression  ? [facility] [place], [place] W, NP  ? 4 months ago Dizziness  ? [facility] [place], [place] W, NP  ? 8  months ago HTN (hypertension), benign  ? [facility] [place], [place] W, NP  ? 10 months ago Essential hypertension  ? [facility] [place], [name], NP  ?  ?  ?Future Appointments   ?        ? In 1 week Diamantina Providence, Herbert Seta, MD [place]  ? In 1 week [name], [name], NP [facility], [place]  ?  ? ?  ?  ?  ? ?rxprotol ? ?

## 2021-12-08 ENCOUNTER — Encounter: Payer: Self-pay | Admitting: *Deleted

## 2021-12-08 ENCOUNTER — Encounter: Payer: Medicare Other | Attending: Cardiology | Admitting: *Deleted

## 2021-12-08 ENCOUNTER — Other Ambulatory Visit: Payer: Self-pay

## 2021-12-08 DIAGNOSIS — Z955 Presence of coronary angioplasty implant and graft: Secondary | ICD-10-CM

## 2021-12-08 DIAGNOSIS — I214 Non-ST elevation (NSTEMI) myocardial infarction: Secondary | ICD-10-CM

## 2021-12-08 NOTE — Progress Notes (Signed)
Initial telephone orientation completed. Diagnosis can be found in [facility] 3/16. EP Orientation scheduled for Thursday 4/6 at 2pm. ?

## 2021-12-10 ENCOUNTER — Ambulatory Visit: Payer: Medicare Other | Admitting: Urology

## 2021-12-12 ENCOUNTER — Ambulatory Visit: Payer: Medicare Other | Admitting: Pharmacist

## 2021-12-12 DIAGNOSIS — I214 Non-ST elevation (NSTEMI) myocardial infarction: Secondary | ICD-10-CM

## 2021-12-12 DIAGNOSIS — E7800 Pure hypercholesterolemia, unspecified: Secondary | ICD-10-CM

## 2021-12-12 DIAGNOSIS — F32A Depression, unspecified: Secondary | ICD-10-CM

## 2021-12-12 DIAGNOSIS — I10 Essential (primary) hypertension: Secondary | ICD-10-CM

## 2021-12-12 DIAGNOSIS — F419 Anxiety disorder, unspecified: Secondary | ICD-10-CM

## 2021-12-12 NOTE — Chronic Care Management (AMB) (Signed)
? ?Chronic Care Management ?CCM Pharmacy Note ? ?12/12/2021 ?Name:  Grant Young MRN:  [id number] DOB:  February 23, 1946 ? ? ?Subjective: ?Grant Young is an 76 y.o. year old male who is a primary patient of [name], NP.  The CCM team was consulted for assistance with disease management and care coordination needs.   ? ?Engaged with patient by telephone for follow up visit for pharmacy case management and/or care coordination services.  ? ?Objective: ? ?Medications Reviewed Today   ? ? Reviewed by Rennis Petty, RPH-CPP (Pharmacist) on 12/12/21 at 1133  Med List Status: <None>  ? ?Medication Order Taking? Sig Documenting Provider Last Dose Status Informant  ?amitriptyline (ELAVIL) 10 MG tablet [id number] Yes TAKE 3 TABLETS (30 MG TOTAL) BY MOUTH AT BEDTIME.  ?Patient taking differently: Take 10 mg by mouth at bedtime.  ? [name], NP Taking Active Spouse/Significant Other  ?anastrozole (ARIMIDEX) 1 MG tablet [id number]  1/2 tablet (0.5 mg) by mouth once weekly [provider]  Active Spouse/Significant Other  ?aspirin 81 MG chewable tablet [id number] Yes Chew 1 tablet (81 mg total) by mouth daily. Val Riles, MD Taking Active   ?atorvastatin (LIPITOR) 80 MG tablet [id number] Yes Take 1 tablet (80 mg total) by mouth daily. Val Riles, MD Taking Active   ?butalbital-acetaminophen-caffeine (FIORICET) 50-325-40 MG tablet [id number]  Take 1 tablet by mouth every 6 (six) hours as needed for headache. [provider]  Active Spouse/Significant Other  ?cetirizine (ZYRTEC) 10 MG tablet [id number]  Take 10 mg by mouth daily as needed for allergies. [provider]  Active Spouse/Significant Other  ?chlorthalidone (HYGROTON) 25 MG tablet [id number] Yes TAKE 1 TABLET (25 MG TOTAL) BY MOUTH DAILY. [name], NP Taking Active Spouse/Significant Other  ?Cholecalciferol 50 MCG (2000 UT) CAPS [id number]  Take 1 tablet by mouth daily. [provider]  Active Spouse/Significant  Other  ?cholestyramine (QUESTRAN) 4 g packet [id number]  Take 1 packet (4 g total) by mouth 3 (three) times daily.  ?Patient taking differently: Take 4 g by mouth daily as needed.  ? [name], MD  Active Spouse/Significant Other  ?Cyanocobalamin (VITAMIN B-12) 5000 MCG LOZG [id number]  Take 1 lozenge by mouth daily.  [provider]  Active Spouse/Significant Other  ?DHEA 25 MG CAPS [id number]  Take 25 mg by mouth daily. [provider]  Active Spouse/Significant Other  ?diazepam (VALIUM) 5 MG tablet [id number]  5 mg PO in am and 2 tabs PO QHS [name], NP  Active   ?dicyclomine (BENTYL) 20 MG tablet [id number]  Take 20 mg by mouth as needed. [provider]  Active Spouse/Significant Other  ?Digestive Aids Mixture (DIGESTION GB PO) [id number]  Take 1 capsule by mouth daily. [provider]  Active Spouse/Significant Other  ?escitalopram (LEXAPRO) 20 MG tablet [id number]  TAKE 1 TABLET BY MOUTH EVERY DAY [name], [name], NP  Active Spouse/Significant Other  ?ezetimibe (ZETIA) 10 MG tablet [id number] No Take 1 tablet (10 mg total) by mouth daily.  ?Patient not taking: Reported on 12/12/2021  ? [name], NP Not Taking Active Spouse/Significant Other  ?         ?Med Note Curley Spice, Jabreel Chimento A   Wed Nov 26, 2021  9:39 AM) Reports taking 1 tablet twice weekly  ?HYDROcodone-acetaminophen (NORCO) 10-325 MG tablet [id number] Yes Take 1 tablet by mouth every 8 (eight) hours as needed for severe pain. Must last 30 days. [name], MD Taking Active Spouse/Significant Other  ?  Loperamide HCl (IMODIUM PO) [id number]  Take by mouth as needed.  [provider]  Active Spouse/Significant Other  ?MAGNESIUM BISGLYCINATE PO [id number]  Take by mouth. [provider]  Active Spouse/Significant Other  ?melatonin 5 MG TABS [id number]  Take 5 mg by mouth at bedtime. [provider]  Active Spouse/Significant Other  ?NONFORMULARY OR COMPOUNDED ITEM [id number]  Super  Bi-Mix Injection  ?Papaverine HCI '30mg'$ /mL Phentolamine Mesylate '1mg'$ /mL ?Inject 0.70m - 166mIncrease as needed to achieve erection  ?Patient taking differently: Super Bi-Mix Injection '150mg'$ /'5mg'$  ?Papaverine HCI '30mg'$ /mL Phentolamine Mesylate '1mg'$ /mL ?Inject 0.48m60m 1mL248mcrease as needed to achieve erection  ? [name]  Active Spouse/Significant Other  ?Nutritional Supplements (NUTRITIONAL SUPPLEMENT PO) [id number] by mouth. [name] [provider]  Active Spouse/Significant Other  ?Nutritional Supplements (NUTRITIONAL SUPPLEMENT PO) [id number] by mouth. [name] [provider]  Active Spouse/Significant Other  ?ondansetron (ZOFRAN) 4 MG tablet [id number] 4 mg by mouth every 6 (six) hours as needed for nausea or vomiting. [provider]  Active Spouse/Significant Other  ?polyethylene glycol (MIRALAX / GLYCOLAX) 17 g packet [id number] 17 g by mouth daily as needed. [provider]  Active Spouse/Significant Other  ?Potassium 99 MG TABS [id number] by mouth 3 (three) times a week. K-Zyme [provider]  Active Spouse/Significant Other  ?Probiotic Product (PROBIOTIC DAILY PO) [id number] by mouth. [provider]  Active Spouse/Significant Other  ?prochlorperazine (COMPAZINE) 10 MG tablet [id number] 10 mg by mouth. TAKE 1 TABLET (10 MG TOTAL) BY MOUTH EVERY 6 (SIX) HOURS AS NEEDED for HEADACHE AND NAUSEA, LIMIT USE TO 3 DAYS PER WEEK [provider]  Active Spouse/Significant Other  ?propranolol (INDERAL) 40 MG tablet [id number] Take 1 tablet (40 mg total) by mouth 2 (two) times daily. [name] Taking Active Spouse/Significant Other  ?pyridostigmine (MESTINON) 60 MG tablet [id number] Take 1 tablet (60 mg total) by mouth 2 (two) times daily. [name] Taking Active Spouse/Significant Other  ?RABEprazole (ACIPHEX) 20 MG tablet [id number] 1 TABLET BY MOUTH TWICE A  DAY [name], [name]  Active Spouse/Significant Other  ?sucralfate (CARAFATE) 1 g tablet [id number] 1 g by mouth 4 (four) times daily as needed.  [provider]  Active Spouse/Significant Other  ?testosterone cypionate (DEPOTESTOSTERONE CYPIONATE) 200 MG/ML injection [id number] Inject 80 mg into the muscle every 14 (fourteen) days. [provider] Taking Active Spouse/Significant Other  ?ticagrelor (BRILINTA) 90 MG TABS tablet [id number] Take 1 tablet (90 mg total) by mouth 2 (two) times daily. KumaVal Riles Taking Active   ?valACYclovir (VALTREX) 1000 MG tablet [id number] 2 tabs p.o. and repeat in 12 hours as needed for cold sore [name]  Active Spouse/Significant Other  ?Vibegron (GEMTESA) 75 MG TABS [id number] 75 mg by mouth daily. VailDebroah Loop-C  Active Spouse/Significant Other  ?Med List Note (WelRise Patience 10/09/21 1156): MR 12/08/21;  ?UDS 09-17-20  ? ?  ?  ? ?  ? ? ?Pertinent Labs:  ? ?Lab Results  ?Component Value Date  ? CHOL 170 11/11/2021  ? HDL 48 11/11/2021  ? LDLCALC 103 (H) 11/11/2021  ? TRIG 99 11/11/2021  ? CHOLHDL 3.5 11/11/2021  ? ?Lab Results  ?Component Value Date  ? CREATININE 0.66 11/30/2021  ? BUN 18 11/30/2021  ? NA 142 11/30/2021  ?  K 3.4 (L) 11/30/2021  ? CL 107 11/30/2021  ? CO2 32 11/30/2021  ? ? ? ?CCM Care Plan ? ?Review of patient past medical history, allergies, medications, health status, including review of consultants reports, laboratory and other test data, was performed as part of comprehensive evaluation and provision of chronic care management services.  ? ?Care Plan : PharmD - medication management  ?Updates made by Rennis Petty, RPH-CPP since 12/12/2021 12:00 AM  ?  ? ?Problem: Disease Progression   ?  ? 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 ?Pharm

## 2021-12-12 NOTE — Patient Instructions (Signed)
Visit Information ? ?Thank you for taking time to visit with me today. Please don't hesitate to contact me if I can be of assistance to you before our next scheduled telephone appointment. ? ?Following are the goals we discussed today:  ? Goals Addressed   ? ?  ?  ?  ?  ? This Visit's Progress  ?  Pharmacy Goals     ?  Please check your home blood pressure, keep a log of the results and bring this with you to your medical appointments. Please call office sooner for readings outside of established parameters or symptoms. ? ?Feel free to call me with any questions or concerns. I look forward to our next call! ? ? ?Wallace Cullens, PharmD, BCACP, CPP ?Clinical Pharmacist ?[facility] ?[place] ?[phone] ?  ? ?  ? ? ? ?Our next appointment is by telephone on 02/06/2022 at 10:45 AM ? ?Please call the care guide team at [phone] if you need to cancel or reschedule your appointment.  ? ? ?Patient verbalizes understanding of instructions and care plan provided today and agrees to view in [place]. Active MyChart status confirmed with patient.   ? ?

## 2021-12-16 ENCOUNTER — Encounter: Payer: Self-pay | Admitting: Internal Medicine

## 2021-12-16 ENCOUNTER — Ambulatory Visit (INDEPENDENT_AMBULATORY_CARE_PROVIDER_SITE_OTHER): Payer: Medicare Other | Admitting: Internal Medicine

## 2021-12-16 VITALS — BP 136/86 | HR 59 | Temp 96.4°F | Wt 141.0 lb

## 2021-12-16 DIAGNOSIS — I214 Non-ST elevation (NSTEMI) myocardial infarction: Secondary | ICD-10-CM

## 2021-12-16 DIAGNOSIS — I249 Acute ischemic heart disease, unspecified: Secondary | ICD-10-CM

## 2021-12-16 NOTE — Patient Instructions (Signed)
Cardiac Rehabilitation ?What is cardiac rehabilitation? ?Cardiac rehabilitation is a treatment program that helps improve the health and well-being of people who have heart problems. Cardiac rehabilitation includes exercise training, education, and counseling to help you get stronger and return to an active lifestyle. This program can help you get better faster and reduce any future hospital stays. ?Why might I need cardiac rehabilitation? ?Cardiac rehabilitation programs can help when you have or have had: ?A heart attack. ?Heart failure. ?Peripheral artery disease. ?Coronary artery disease. ?Angina. ?Lung or breathing problems. ?Cardiac rehabilitation programs are also used when you have had: ?Coronary artery bypass graft surgery. ?Heart valve replacement. ?Heart stent placement. ?Heart transplant. ?Aneurysm repair. ?What are the benefits of cardiac rehabilitation? ?Cardiac rehabilitation can help you: ?Reduce problems like chest pain and trouble breathing. ?Change risk factors that contribute to heart disease, such as: ?Smoking. ?High blood pressure. ?High cholesterol. ?Diabetes. ?Being inactive. ?Weighing over 30% more than your ideal weight. ?Diet. ?Improve your emotional outlook so you feel: ?More hopeful. ?Better about yourself. ?More confident about taking care of yourself. ?Get support from health experts as well as other people with similar problems. ?Learn healthy ways to manage stress. ?Learn how to manage and understand your medicines. ?Teach your family about your condition and how to participate in your recovery. ?What happens in cardiac rehabilitation? ?You will be assessed by a cardiac rehabilitation team. They will check your health history and do a physical exam. You may need blood tests, exercise stress tests, and other evaluations to make sure that you are ready to start cardiac rehabilitation. ?The cardiac rehabilitation team works with you to make a plan based on your health and goals. Your  program will be tailored to fit you and your needs and may change as you progress. You may work with a health care team that includes: ?Doctors. ?Nurses. ?Dietitians. ?Psychologists. ?Exercise specialists. ?Physical and occupational therapists. ?What are the phases of cardiac rehabilitation? ?A cardiac rehabilitation program is often divided into phases. You advance from one phase to the next. ?Phase 1 ?This phase starts while you are still in the hospital. You may: ?Start by walking in your room and then in the hall. ?Do some simple exercises with a therapist. ? ?Phase 2 ?This phase begins when you go home or to another facility. You will travel to a cardiac rehabilitation center or another place where rehabilitation is offered. This phase may last 8-12 weeks. During this phase: ?You will slowly increase your activity level while being closely watched by a nurse or therapist. ?You will have medical tests and exams to monitor your progress. ?Your exercises may include strength or resistance training along with activities that cause your heart to beat faster (aerobic exercises), such as walking on a treadmill. ?Your condition will determine how often and how long these sessions last. ?You may learn how to: ?[employer]. ?Control your blood sugar, if this applies. ?Stop smoking. ?Manage your medicines. You may need help with scheduling or planning how and when to take your medicines. If you have questions about your medicines, it is very important that you talk with your health care provider. ? ?Phase 3 ?This phase continues for the rest of your life. In this phase: ?There will be less supervision. ?You may continue to participate in cardiac rehabilitation activities or become part of a group in your community. ?You may benefit from talking about your experience with other people who are facing similar challenges. ?Follow these instructions at home: ?Take over-the-counter and  prescription medicines only  as told by your health care provider. ?Keep all follow-up visits as told by your health care provider. This is important. ?Get help right away if: ?You have severe chest discomfort, especially if the pain is crushing or pressure-like and spreads to your arms, back, neck, or jaw. Do not wait to see if the pain will go away. ?You have weakness or numbness in your face, arms, or legs, especially on one side of the body. ?Your speech is slurred. ?You are confused. ?You have a sudden, severe headache or loss of vision. ?You have shortness of breath. ?You are sweating and have nausea. ?You feel dizzy or faint. ?You are fatigued. ?These symptoms may represent a serious problem that is an emergency. Do not wait to see if the symptoms will go away. Get medical help right away. Call your local emergency services (911 in the [place] Do not drive yourself to the hospital. ?Summary ?Cardiac rehabilitation is a treatment program that helps improve the health and well-being of people who have heart problems. ?A cardiac rehabilitation program is often divided into phases. You advance from one phase to the next. ?The cardiac rehabilitation team works with you to make a plan based on your health and goals. ?Cardiac rehabilitation includes exercise training, education, and counseling to help you get stronger and return to an active lifestyle. ?This information is not intended to replace advice given to you by your health care provider. Make sure you discuss any questions you have with your health care provider. ?Document Revised: 12/31/2020 Document Reviewed: 12/31/2020 ?Elsevier Patient Education ? [place]. ? ?

## 2021-12-16 NOTE — Progress Notes (Signed)
? ?Subjective:  ? ? Patient ID: Grant Young, male    DOB: 11-17-1945, 76 y.o.   MRN: [id number] ? ?HPI ? ?Pt presents to the clinic today for hospital follow up. He presented to the ER 3/16 with c/o chest pain radiating into the left jaw and left arm. ECG showed changes consistent with NSTEMI. Troponin peaked at 167. Cardiology was consulted, he was taken to the cath lab where he had PCI with stent placement in the left circumflex and left LAD. Cardiology recommended lifelong DAPT. He was to continue his other home meds. He was discharged 3/19 with [facility] PT, referral for cardiac rehab. Since discharge, he had a PT evaluation but they decided to just let him go to cardiac rehab on 4/6. He does feel more fatigued than before. He reports he is sleeping well, appetite is good. He denies chest pain or shortness of breath. He denies lower extremity edema. He follows up with cardiology on 4/14. ? ?Review of Systems ? ?   ?Past Medical History:  ?Diagnosis Date  ? Anxiety   ? Per New Patient Packet  ? Chronic back pain   ? Per New Patient Packet  ? Chronic heart disease   ? Per New Patient Packet  ? Depression   ? GERD (gastroesophageal reflux disease)   ? Headache   ? History of CT scan of brain 09/14/2018  ? Per New Patient Packet  ? History of depression   ? Per New Patient Packet  ? History of gastritis   ? Per New Patient Packet  ? History of headache   ? Per New Patient Packet  ? History of kidney stones   ? Per New Patient Packet  ? History of neuropathy   ? Per New Patient Packet  ? Hypertension   ? Per New Patient Packet  ? Insomnia   ? Kidney stone   ? Lumbar radicular pain   ? Per New Patient Packet  ? Lyme disease   ? Per New Patient Packet  ? Myocardial infarction [facility])   ? Overactive bladder   ? PONV (postoperative nausea and vomiting)   ? Skin cancer, basal cell   ? Sleep trouble   ? Per New Patient Packet  ? Spinal cord stimulator status   ? 01/08/21 - not currently using.  ? Squamous cell skin cancer   ?  Substance abuse ([place])   ? ? ?Current Outpatient Medications  ?Medication Sig Dispense Refill  ? amitriptyline (ELAVIL) 10 MG tablet TAKE 3 TABLETS (30 MG TOTAL) BY MOUTH AT BEDTIME. (Patient taking differently: Take 10 mg by mouth at bedtime.) 270 tablet 0  ? anastrozole (ARIMIDEX) 1 MG tablet 1/2 tablet (0.5 mg) by mouth once weekly    ? aspirin 81 MG chewable tablet Chew 1 tablet (81 mg total) by mouth daily. 90 tablet 3  ? atorvastatin (LIPITOR) 80 MG tablet Take 1 tablet (80 mg total) by mouth daily. 90 tablet 3  ? butalbital-acetaminophen-caffeine (FIORICET) 50-325-40 MG tablet Take 1 tablet by mouth every 6 (six) hours as needed for headache.    ? cetirizine (ZYRTEC) 10 MG tablet Take 10 mg by mouth daily as needed for allergies.    ? chlorthalidone (HYGROTON) 25 MG tablet TAKE 1 TABLET (25 MG TOTAL) BY MOUTH DAILY. 90 tablet 0  ? Cholecalciferol 50 MCG (2000 UT) CAPS Take 1 tablet by mouth daily.    ? cholestyramine (QUESTRAN) 4 g packet Take 1 packet (4 g total) by mouth 3 (  three) times daily. (Patient taking differently: Take 4 g by mouth daily as needed.) 270 packet 3  ? Cyanocobalamin (VITAMIN B-12) 5000 MCG LOZG Take 1 lozenge by mouth daily.     ? DHEA 25 MG CAPS Take 25 mg by mouth daily.    ? diazepam (VALIUM) 5 MG tablet 5 mg PO in am and 2 tabs PO QHS 90 tablet 0  ? dicyclomine (BENTYL) 20 MG tablet Take 20 mg by mouth as needed.    ? Digestive Aids Mixture (DIGESTION GB PO) Take 1 capsule by mouth daily.    ? escitalopram (LEXAPRO) 20 MG tablet TAKE 1 TABLET BY MOUTH EVERY DAY 90 tablet 1  ? ezetimibe (ZETIA) 10 MG tablet Take 1 tablet (10 mg total) by mouth daily. (Patient not taking: Reported on 12/12/2021) 90 tablet 1  ? HYDROcodone-acetaminophen (NORCO) 10-325 MG tablet Take 1 tablet by mouth every 8 (eight) hours as needed for severe pain. Must last 30 days. 90 tablet 0  ? Loperamide HCl (IMODIUM PO) Take by mouth as needed.     ? MAGNESIUM BISGLYCINATE PO Take by mouth.    ? melatonin 5 MG  TABS Take 5 mg by mouth at bedtime.    ? NONFORMULARY OR COMPOUNDED ITEM Super Bi-Mix Injection  ?Papaverine HCI '30mg'$ /mL Phentolamine Mesylate '1mg'$ /mL ?Inject 0.52m - 159mIncrease as needed to achieve erection (Patient taking differently: Super Bi-Mix Injection '150mg'$ /'5mg'$  ?Papaverine HCI '30mg'$ /mL Phentolamine Mesylate '1mg'$ /mL ?Inject 0.38m79m 1mL67mcrease as needed to achieve erection) 5 each 6  ? Nutritional Supplements (NUTRITIONAL SUPPLEMENT PO) Take by mouth. [name]    ? Nutritional Supplements (NUTRITIONAL SUPPLEMENT PO) Take by mouth. [name]    ? ondansetron (ZOFRAN) 4 MG tablet Take 4 mg by mouth every 6 (six) hours as needed for nausea or vomiting.    ? polyethylene glycol (MIRALAX / GLYCOLAX) 17 g packet Take 17 g by mouth daily as needed.    ? Potassium 99 MG TABS Take by mouth 3 (three) times a week. K-Zyme    ? Probiotic Product (PROBIOTIC DAILY PO) Take by mouth.    ? prochlorperazine (COMPAZINE) 10 MG tablet Take 10 mg by mouth. TAKE 1 TABLET (10 MG TOTAL) BY MOUTH EVERY 6 (SIX) HOURS AS NEEDED for HEADACHE AND NAUSEA, LIMIT USE TO 3 DAYS PER WEEK    ? propranolol (INDERAL) 40 MG tablet Take 1 tablet (40 mg total) by mouth 2 (two) times daily. 180 tablet 0  ? pyridostigmine (MESTINON) 60 MG tablet Take 1 tablet (60 mg total) by mouth 2 (two) times daily. 180 tablet 0  ? RABEprazole (ACIPHEX) 20 MG tablet TAKE 1 TABLET BY MOUTH TWICE A DAY 180 tablet 0  ? sucralfate (CARAFATE) 1 g tablet Take 1 g by mouth 4 (four) times daily as needed.     ? testosterone cypionate (DEPOTESTOSTERONE CYPIONATE) 200 MG/ML injection Inject 80 mg into the muscle every 14 (fourteen) days.    ? ticagrelor (BRILINTA) 90 MG TABS tablet Take 1 tablet (90 mg total) by mouth 2 (two) times daily. 180 tablet 3  ? valACYclovir (VALTREX) 1000 MG tablet Take 2 tabs p.o. and repeat in 12 hours as needed for cold sore 30 tablet 0  ? Vibegron (GEMTESA) 75 MG TABS Take 75 mg by mouth daily. 90 tablet 3  ? ?No current  facility-administered medications for this visit.  ? ? ?Allergies  ?Allergen Reactions  ? Ace Inhibitors   ?  Other reaction(s): Cough  ?  Fluoxetine Anxiety  ?  "made me fall asleep" per pt ?"bad headaches and "makes  Me  Crazy" ?historical allergy noted in [employer] ?"made me fall asleep" per pt ?"bad headaches and "makes  Me  Crazy" ?Per New Patient Packet.  ?  ? Metoclopramide   ?  Other reaction(s): Other (See Comments), Other (See Comments), Unknown ?Tardive Dyskinesia  ?historical allergy noted in [employer] ?Tardive Dyskinesia  ?Per New Patient Packet.  ? Nalbuphine   ?  Used Post Back surgery- Anesthesiologist Error. Patient had Narcotic Withdraw. Per New Patient Packet.   ? Other   ?  Other reaction(s): Other (See Comments) ?Altered mental status in combo with narcotics at previous hospitalization - Full Withdrawal Symptoms ?Other reaction(s): Rash  ? Amoxicillin-Pot Clavulanate Nausea Only  ?  Per New Patient Packet.  ? Doxazosin Rash  ?  Other reaction(s): Other - See Comments, Rash ?UNKNOWN REACTION ?UNKNOWN REACTION ?  ? Duloxetine Nausea Only  ?  Per New Patient Packet.   ? Penicillins Nausea Only  ?  Per New Patient Packet.  ? Tamsulosin Itching and Anxiety  ?  Restless, Flushing, Heavy Chest, Itching, Hyperactive mood and Anxiety. Unable to handle side effects. Per New Patient Packet.  ?  ? Trazodone And Nefazodone Itching, Anxiety and Rash  ?  Headache. ?"INCREASED MY ANXIETY AND HEARTRATE" ?Flushing, tachycardia ?"INCREASED MY ANXIETY AND HEARTRATE" ?Per New Patient Packet. ?  ? Amlodipine   ?  Shaking, unsure of reaction. Per New Patient Packet.  ?  ? Cinoxacin   ?  GI Intolerance, and Dizziness. Per New Patient Packet.   ? Ciprofloxacin   ?  Other reaction(s): Unknown  ? Fludrocortisone Other (See Comments)  ?  "Worsening headaches, GI issues, fatigue"  ? Nebivolol   ?  Other reaction(s): Unknown  ? Olanzapine   ?  Headache and unable to sleep for 3 nights. Per New Patient Packet. ?  ? Olmesartan    ?  Other reaction(s): Unknown  ? Pregabalin   ?  Confusion, Lack of concentration, dizziness, and likely drowsiness. Per New Patient Packet.  ?  ? Prostaglandins   ?  Other reaction(s): Other (See Comments) ?Into

## 2021-12-18 ENCOUNTER — Other Ambulatory Visit: Payer: Self-pay | Admitting: Internal Medicine

## 2021-12-18 ENCOUNTER — Encounter: Payer: Medicare Other | Attending: Cardiology

## 2021-12-18 VITALS — Ht 69.0 in | Wt 141.6 lb

## 2021-12-18 DIAGNOSIS — I252 Old myocardial infarction: Secondary | ICD-10-CM | POA: Diagnosis present

## 2021-12-18 DIAGNOSIS — I214 Non-ST elevation (NSTEMI) myocardial infarction: Secondary | ICD-10-CM

## 2021-12-18 DIAGNOSIS — Z955 Presence of coronary angioplasty implant and graft: Secondary | ICD-10-CM | POA: Insufficient documentation

## 2021-12-18 NOTE — Patient Instructions (Signed)
Patient Instructions ? ?Patient Details  ?Name: Grant Young ?MRN: [id number] ?Date of Birth: 08/22/1946 ?Referring Provider:  Leonie Man, MD ? ?Below are your personal goals for exercise, nutrition, and risk factors. Our goal is to help you stay on track towards obtaining and maintaining these goals. We will be discussing your progress on these goals with you throughout the program. ? ?Initial Exercise Prescription: ? Initial Exercise Prescription - 12/18/21 1600   ? ?  ? Date of Initial Exercise RX and Referring Provider  ? Date 12/18/21   ? Referring Provider Grant Young   ?  ? Oxygen  ? Oxygen Continuous   ? Maintain Oxygen Saturation 88% or higher   ?  ? Treadmill  ? MPH 1.5   ? Grade 0   ? Minutes 15   ? METs 2.15   ?  ? Recumbant Bike  ? Level 2   ? RPM 60   ? Minutes 15   ? METs 2   ?  ? NuStep  ? Level 2   ? SPM 80   ? Minutes 15   ? METs 2   ?  ? T5 Nustep  ? Level 1   ? SPM 80   ? Minutes 15   ? METs 2   ?  ? Track  ? Laps 18   ? Minutes 15   ? METs 2   ?  ? Prescription Details  ? Frequency (times per week) 3   ? Duration Progress to 30 minutes of continuous aerobic without signs/symptoms of physical distress   ?  ? Intensity  ? THRR 40-80% of Max Heartrate 90-128   ? Ratings of Perceived Exertion 11-13   ? Perceived Dyspnea 0-4   ?  ? Resistance Training  ? Training Prescription Yes   ? Weight 3 lb   ? Reps 10-15   ? ?  ?  ? ?  ? ? ?Exercise Goals: ?Frequency: Be able to perform aerobic exercise two to three times per week in program working toward 2-5 days per week of home exercise. ? ?Intensity: Work with a perceived exertion of 11 (fairly light) - 15 (hard) while following your exercise prescription.  We will make changes to your prescription with you as you progress through the program. ?  ?Duration: Be able to do 30 to 45 minutes of continuous aerobic exercise in addition to a 5 minute warm-up and a 5 minute cool-down routine. ?  ?Nutrition Goals: ?Your personal nutrition goals will be  established when you do your nutrition analysis with the dietician. ? ?The following are general nutrition guidelines to follow: ?Cholesterol < '200mg'$ /day ?Sodium < '1500mg'$ /day ?Fiber: Men over 50 yrs - 30 grams per day ? ?Personal Goals: ? Personal Goals and Risk Factors at Admission - 12/08/21 1428   ? ?  ? Core Components/Risk Factors/Patient Goals on Admission  ? Hypertension Yes   ? Intervention Provide education on lifestyle modifcations including regular physical activity/exercise, weight management, moderate sodium restriction and increased consumption of fresh fruit, vegetables, and low fat dairy, alcohol moderation, and smoking cessation.;Monitor prescription use compliance.   ? Expected Outcomes Short Term: Continued assessment and intervention until BP is < 140/75m HG in hypertensive participants. < 130/896mHG in hypertensive participants with diabetes, heart failure or chronic kidney disease.;Long Term: Maintenance of blood pressure at goal levels.   ? Lipids Yes   ? Intervention Provide education and support for participant on nutrition & aerobic/resistive exercise along with prescribed  medications to achieve LDL '70mg'$ , HDL >'40mg'$ .   ? Expected Outcomes Short Term: Participant states understanding of desired cholesterol values and is compliant with medications prescribed. Participant is following exercise prescription and nutrition guidelines.;Long Term: Cholesterol controlled with medications as prescribed, with individualized exercise RX and with personalized nutrition plan. Value goals: LDL < '70mg'$ , HDL > 40 mg.   ? ?  ?  ? ?  ? ? ?Tobacco Use Initial Evaluation: ?Social History  ? ?Tobacco Use  ?Smoking Status Never  ?Smokeless Tobacco Never  ? ? ?Exercise Goals and Review: ? Exercise Goals   ? ? Grant Young Name 12/18/21 1634  ?  ?  ?  ?  ?  ? Exercise Goals  ? Increase Physical Activity Yes      ? Intervention Provide advice, education, support and counseling about physical activity/exercise needs.;Develop  an individualized exercise prescription for aerobic and resistive training based on initial evaluation findings, risk stratification, comorbidities and participant's personal goals.      ? Expected Outcomes Short Term: Attend rehab on a regular basis to increase amount of physical activity.;Long Term: Add in home exercise to make exercise part of routine and to increase amount of physical activity.;Long Term: Exercising regularly at least 3-5 days a week.      ? Increase Strength and Stamina Yes      ? Intervention Provide advice, education, support and counseling about physical activity/exercise needs.;Develop an individualized exercise prescription for aerobic and resistive training based on initial evaluation findings, risk stratification, comorbidities and participant's personal goals.      ? Expected Outcomes Short Term: Increase workloads from initial exercise prescription for resistance, speed, and METs.;Short Term: Perform resistance training exercises routinely during rehab and add in resistance training at home;Long Term: Improve cardiorespiratory fitness, muscular endurance and strength as measured by increased METs and functional capacity (6MWT)      ? Able to understand and use rate of perceived exertion (RPE) scale Yes      ? Intervention Provide education and explanation on how to use RPE scale      ? Expected Outcomes Short Term: Able to use RPE daily in rehab to express subjective intensity level;Long Term:  Able to use RPE to guide intensity level when exercising independently      ? Able to understand and use Dyspnea scale Yes      ? Intervention Provide education and explanation on how to use Dyspnea scale      ? Expected Outcomes Short Term: Able to use Dyspnea scale daily in rehab to express subjective sense of shortness of breath during exertion;Long Term: Able to use Dyspnea scale to guide intensity level when exercising independently      ? Knowledge and understanding of Target Heart Rate  Range (THRR) Yes      ? Intervention Provide education and explanation of THRR including how the numbers were predicted and where they are located for reference      ? Expected Outcomes Short Term: Able to state/look up THRR;Short Term: Able to use daily as guideline for intensity in rehab;Long Term: Able to use THRR to govern intensity when exercising independently      ? Able to check pulse independently Yes      ? Intervention Provide education and demonstration on how to check pulse in carotid and radial arteries.;Review the importance of being able to check your own pulse for safety during independent exercise      ? Expected Outcomes Short Term: Able to  explain why pulse checking is important during independent exercise;Long Term: Able to check pulse independently and accurately      ? Understanding of Exercise Prescription Yes      ? Intervention Provide education, explanation, and written materials on patient's individual exercise prescription      ? Expected Outcomes Short Term: Able to explain program exercise prescription;Long Term: Able to explain home exercise prescription to exercise independently      ? ?  ?  ? ?  ? ? ?Copy of goals given to participant. ?

## 2021-12-18 NOTE — Progress Notes (Signed)
Cardiac Individual Treatment Plan ? ?Patient Details  ?Name: Grant Young ?MRN: [id number] ?Date of Birth: 01-03-1946 ?Referring Provider:   ?Flowsheet Row Cardiac Rehab from 12/18/2021 in [facility] Cardiac and Pulmonary Rehab  ?Referring Provider Ellyn Hack  ? ?  ? ? ?Initial Encounter Date:  ?Flowsheet Row Cardiac Rehab from 12/18/2021 in [facility] Cardiac and Pulmonary Rehab  ?Date 12/18/21  ? ?  ? ? ?Visit Diagnosis: NSTEMI (non-ST elevated myocardial infarction) ([place]) ? ?Status post coronary artery stent placement ? ?Patient's Home Medications on Admission: ? ?Current Outpatient Medications:  ?  amitriptyline (ELAVIL) 10 MG tablet, TAKE 3 TABLETS (30 MG TOTAL) BY MOUTH AT BEDTIME. (Patient taking differently: Take 10 mg by mouth at bedtime.), Disp: 270 tablet, Rfl: 0 ?  anastrozole (ARIMIDEX) 1 MG tablet, 1/2 tablet (0.5 mg) by mouth once weekly, Disp: , Rfl:  ?  aspirin 81 MG chewable tablet, Chew 1 tablet (81 mg total) by mouth daily., Disp: 90 tablet, Rfl: 3 ?  atorvastatin (LIPITOR) 80 MG tablet, Take 1 tablet (80 mg total) by mouth daily., Disp: 90 tablet, Rfl: 3 ?  butalbital-acetaminophen-caffeine (FIORICET) 50-325-40 MG tablet, Take 1 tablet by mouth every 6 (six) hours as needed for headache., Disp: , Rfl:  ?  cetirizine (ZYRTEC) 10 MG tablet, Take 10 mg by mouth daily as needed for allergies., Disp: , Rfl:  ?  chlorthalidone (HYGROTON) 25 MG tablet, TAKE 1 TABLET (25 MG TOTAL) BY MOUTH DAILY., Disp: 90 tablet, Rfl: 0 ?  Cholecalciferol 50 MCG (2000 UT) CAPS, Take 1 tablet by mouth daily., Disp: , Rfl:  ?  cholestyramine (QUESTRAN) 4 g packet, Take 1 packet (4 g total) by mouth 3 (three) times daily. (Patient taking differently: Take 4 g by mouth daily as needed.), Disp: 270 packet, Rfl: 3 ?  Cyanocobalamin (VITAMIN B-12) 5000 MCG LOZG, Take 1 lozenge by mouth daily. , Disp: , Rfl:  ?  DHEA 25 MG CAPS, Take 25 mg by mouth daily., Disp: , Rfl:  ?  diazepam (VALIUM) 5 MG tablet, 5 mg PO in am and 2 tabs PO QHS, Disp: 90  tablet, Rfl: 0 ?  dicyclomine (BENTYL) 20 MG tablet, Take 20 mg by mouth as needed., Disp: , Rfl:  ?  Digestive Aids Mixture (DIGESTION GB PO), Take 1 capsule by mouth daily., Disp: , Rfl:  ?  escitalopram (LEXAPRO) 20 MG tablet, TAKE 1 TABLET BY MOUTH EVERY DAY, Disp: 90 tablet, Rfl: 1 ?  ezetimibe (ZETIA) 10 MG tablet, Take 1 tablet (10 mg total) by mouth daily. (Patient not taking: Reported on 12/12/2021), Disp: 90 tablet, Rfl: 1 ?  HYDROcodone-acetaminophen (NORCO) 10-325 MG tablet, Take 1 tablet by mouth every 8 (eight) hours as needed for severe pain. Must last 30 days., Disp: 90 tablet, Rfl: 0 ?  Loperamide HCl (IMODIUM PO), Take by mouth as needed. , Disp: , Rfl:  ?  MAGNESIUM BISGLYCINATE PO, Take by mouth., Disp: , Rfl:  ?  melatonin 5 MG TABS, Take 5 mg by mouth at bedtime., Disp: , Rfl:  ?  NONFORMULARY OR COMPOUNDED ITEM, Super Bi-Mix Injection  Papaverine HCI '30mg'$ /mL Phentolamine Mesylate '1mg'$ /mL Inject 0.96m - 177mIncrease as needed to achieve erection (Patient taking differently: Super Bi-Mix Injection '150mg'$ /'5mg'$  Papaverine HCI '30mg'$ /mL Phentolamine Mesylate '1mg'$ /mL Inject 0.69m32m 1mL19mcrease as needed to achieve erection), Disp: 5 each, Rfl: 6 ?  Nutritional Supplements (NUTRITIONAL SUPPLEMENT PO), Take by mouth. [name], Disp: , Rfl:  ?  Nutritional Supplements (NUTRITIONAL SUPPLEMENT  PO), Take by mouth. [name], Disp: , Rfl:  ?  ondansetron (ZOFRAN) 4 MG tablet, Take 4 mg by mouth every 6 (six) hours as needed for nausea or vomiting., Disp: , Rfl:  ?  polyethylene glycol (MIRALAX / GLYCOLAX) 17 g packet, Take 17 g by mouth daily as needed., Disp: , Rfl:  ?  Potassium 99 MG TABS, Take by mouth 3 (three) times a week. K-Zyme, Disp: , Rfl:  ?  Probiotic Product (PROBIOTIC DAILY PO), Take by mouth., Disp: , Rfl:  ?  prochlorperazine (COMPAZINE) 10 MG tablet, Take 10 mg by mouth. TAKE 1 TABLET (10 MG TOTAL) BY MOUTH EVERY 6 (SIX) HOURS AS NEEDED for HEADACHE AND NAUSEA, LIMIT USE TO 3  DAYS PER WEEK, Disp: , Rfl:  ?  propranolol (INDERAL) 40 MG tablet, Take 1 tablet (40 mg total) by mouth 2 (two) times daily., Disp: 180 tablet, Rfl: 0 ?  pyridostigmine (MESTINON) 60 MG tablet, Take 1 tablet (60 mg total) by mouth 2 (two) times daily., Disp: 180 tablet, Rfl: 0 ?  RABEprazole (ACIPHEX) 20 MG tablet, TAKE 1 TABLET BY MOUTH TWICE A DAY, Disp: 180 tablet, Rfl: 0 ?  sucralfate (CARAFATE) 1 g tablet, Take 1 g by mouth 4 (four) times daily as needed. , Disp: , Rfl:  ?  testosterone cypionate (DEPOTESTOSTERONE CYPIONATE) 200 MG/ML injection, Inject 80 mg into the muscle every 14 (fourteen) days., Disp: , Rfl:  ?  ticagrelor (BRILINTA) 90 MG TABS tablet, Take 1 tablet (90 mg total) by mouth 2 (two) times daily., Disp: 180 tablet, Rfl: 3 ?  valACYclovir (VALTREX) 1000 MG tablet, Take 2 tabs p.o. and repeat in 12 hours as needed for cold sore, Disp: 30 tablet, Rfl: 0 ?  Vibegron (GEMTESA) 75 MG TABS, Take 75 mg by mouth daily., Disp: 90 tablet, Rfl: 3 ? ?Past Medical History: ?Past Medical History:  ?Diagnosis Date  ? Anxiety   ? Per New Patient Packet  ? Chronic back pain   ? Per New Patient Packet  ? Chronic heart disease   ? Per New Patient Packet  ? Depression   ? GERD (gastroesophageal reflux disease)   ? Headache   ? History of CT scan of brain 09/14/2018  ? Per New Patient Packet  ? History of depression   ? Per New Patient Packet  ? History of gastritis   ? Per New Patient Packet  ? History of headache   ? Per New Patient Packet  ? History of kidney stones   ? Per New Patient Packet  ? History of neuropathy   ? Per New Patient Packet  ? Hypertension   ? Per New Patient Packet  ? Insomnia   ? Kidney stone   ? Lumbar radicular pain   ? Per New Patient Packet  ? Lyme disease   ? Per New Patient Packet  ? Myocardial infarction [facility])   ? Overactive bladder   ? PONV (postoperative nausea and vomiting)   ? Skin cancer, basal cell   ? Sleep trouble   ? Per New Patient Packet  ? Spinal cord stimulator status    ? 01/08/21 - not currently using.  ? Squamous cell skin cancer   ? Substance abuse ([place])   ? ? ?Tobacco Use: ?Social History  ? ?Tobacco Use  ?Smoking Status Never  ?Smokeless Tobacco Never  ? ? ?Labs: ?Review Flowsheet   ? ?  ?  Latest Ref Rng & Units 08/24/2019 10/17/2019 05/16/2020 11/11/2021  ?Labs  for [facility] Cardiac and Pulmonary Rehab  ?Cholestrol <200 mg/dL 168       170   170    ?LDL (calc) mg/dL (calc) 96       100   103    ?HDL-C > OR = 40 mg/dL 48       56   48    ?Trlycerides <150 mg/dL 121       59   99    ?Hemoglobin A1c   5.1         ?  ? ? This result is from an external source.  ? Multiple values from one day are sorted in reverse-chronological order  ?  ?  ? ? ? ?Exercise Target Goals: ?Exercise Program Goal: ?Individual exercise prescription set using results from initial 6 min walk test and THRR while considering  patient?s activity barriers and safety.  ? ?Exercise Prescription Goal: ?Initial exercise prescription builds to 30-45 minutes a day of aerobic activity, 2-3 days per week.  Home exercise guidelines will be given to patient during program as part of exercise prescription that the participant will acknowledge. ? ? ?Education: Aerobic Exercise: ?- Group verbal and visual presentation on the components of exercise prescription. Introduces F.I.T.T principle from ACSM for exercise prescriptions.  Reviews F.I.T.T. principles of aerobic exercise including progression. Written material given at graduation. ? ? ?Education: Resistance Exercise: ?- Group verbal and visual presentation on the components of exercise prescription. Introduces F.I.T.T principle from ACSM for exercise prescriptions  Reviews F.I.T.T. principles of resistance exercise including progression. Written material given at graduation. ? ?  ?Education: Exercise & Equipment Safety: ?- Individual verbal instruction and demonstration of equipment use and safety with use of the equipment. ?Flowsheet Row Cardiac Rehab from 12/18/2021 in [facility]  Cardiac and Pulmonary Rehab  ?Date 12/18/21  ?Educator AS  ?Instruction Review Code 1- Verbalizes Understanding  ? ?  ? ? ?Education: Exercise Physiology & General Exercise Guidelines: ?- Group verbal an

## 2021-12-19 NOTE — Telephone Encounter (Signed)
Attempted to call patient- left message. Attempted to call pharmacy to verify Rx filled 11/13/21- unable to get through ?Requested Prescriptions  ?Pending Prescriptions Disp Refills  ?? propranolol (INDERAL) 40 MG tablet [Pharmacy Med Name: PROPRANOLOL 40 MG TABLET] 180 tablet 0  ?  Sig: TAKE 1 TABLET BY MOUTH TWICE A DAY  ?  ? Cardiovascular:  Beta Blockers Passed - 12/18/2021  4:46 PM  ?  ?  Passed - Last BP in normal range  ?  BP Readings from Last 1 Encounters:  ?12/16/21 136/86  ?   ?  ?  Passed - Last Heart Rate in normal range  ?  Pulse Readings from Last 1 Encounters:  ?12/16/21 (!) 59  ?   ?  ?  Passed - Valid encounter within last 6 months  ?  Recent Outpatient Visits   ?      ? 3 days ago Non-ST elevation (NSTEMI) myocardial infarction [facility])  ? [place], NP  ? 1 month ago Excessive daytime sleepiness  ? [facility] [place], [place] W, NP  ? 3 months ago Anxiety and depression  ? [facility] [place], [place], NP  ? 5 months ago Dizziness  ? [facility] [place], [place], NP  ? 8 months ago HTN (hypertension), benign  ? [facility] [place], [name], NP  ?  ?  ?Future Appointments   ?        ? In 1 week Dunn, Areta Haber, PA-C [facility]  ?  ? ?  ?  ?  ? ?

## 2021-12-22 ENCOUNTER — Encounter: Payer: Medicare Other | Admitting: *Deleted

## 2021-12-22 DIAGNOSIS — Z955 Presence of coronary angioplasty implant and graft: Secondary | ICD-10-CM

## 2021-12-22 DIAGNOSIS — I252 Old myocardial infarction: Secondary | ICD-10-CM | POA: Diagnosis not present

## 2021-12-22 DIAGNOSIS — I214 Non-ST elevation (NSTEMI) myocardial infarction: Secondary | ICD-10-CM

## 2021-12-22 NOTE — Progress Notes (Signed)
Daily Session Note ? ?Patient Details  ?Name: Grant Young ?MRN: [id number] ?Date of Birth: 1945-10-06 ?Referring Provider:   ?Flowsheet Row Cardiac Rehab from 12/18/2021 in [facility] Cardiac and Pulmonary Rehab  ?Referring Provider Ellyn Hack  ? ?  ? ? ?Encounter Date: 12/22/2021 ? ?Check In: ? Session Check In - 12/22/21 1119   ? ?  ? Check-In  ? Supervising physician immediately available to respond to emergencies See telemetry face sheet for immediately available ER MD   ? Location [facility]-Cardiac & Pulmonary Rehab   ? Staff Present Renita Papa, RN Moises Blood, BS, ACSM CEP, Exercise Physiologist;Laureen Owens Shark, BS, RRT, CPFT;Kelly Rosalia Hammers, MPA, RN   ? Virtual Visit No   ? Medication changes reported     No   ? Fall or balance concerns reported    No   ? Warm-up and Cool-down Performed on first and last piece of equipment   ? Resistance Training Performed Yes   ? VAD Patient? No   ? PAD/SET Patient? No   ?  ? Pain Assessment  ? Currently in Pain? No/denies   ? ?  ?  ? ?  ? ? ? ? ? ?Social History  ? ?Tobacco Use  ?Smoking Status Never  ?Smokeless Tobacco Never  ? ? ?Goals Met:  ?Independence with exercise equipment ?Exercise tolerated well ?No report of concerns or symptoms today ?Strength training completed today ? ?Goals Unmet:  ?Not Applicable ? ?Comments: First full day of exercise!  Patient was oriented to gym and equipment including functions, settings, policies, and procedures.  Patient's individual exercise prescription and treatment plan were reviewed.  All starting workloads were established based on the results of the 6 minute walk test done at initial orientation visit.  The plan for exercise progression was also introduced and progression will be customized based on patient's performance and goals. ? ? ? ? ?Dr. Emily Filbert is Medical Director for [place].  ?Dr. Ottie Glazier is Medical Director for [facility] Pulmonary Rehabilitation. ?

## 2021-12-23 NOTE — Telephone Encounter (Signed)
Patient returned call - Pt found refill bottle. Medication was refilled 11/13/2021. He does not need more medication at this time. ?

## 2021-12-24 ENCOUNTER — Encounter: Payer: Medicare Other | Admitting: *Deleted

## 2021-12-24 DIAGNOSIS — I214 Non-ST elevation (NSTEMI) myocardial infarction: Secondary | ICD-10-CM

## 2021-12-24 DIAGNOSIS — I252 Old myocardial infarction: Secondary | ICD-10-CM | POA: Diagnosis not present

## 2021-12-24 DIAGNOSIS — Z955 Presence of coronary angioplasty implant and graft: Secondary | ICD-10-CM

## 2021-12-24 NOTE — Progress Notes (Signed)
Daily Session Note ? ?Patient Details  ?Name: Grant Young ?MRN: [id number] ?Date of Birth: 1946-06-10 ?Referring Provider:   ?Flowsheet Row Cardiac Rehab from 12/18/2021 in [facility] Cardiac and Pulmonary Rehab  ?Referring Provider Ellyn Hack  ? ?  ? ? ?Encounter Date: 12/24/2021 ? ?Check In: ? Session Check In - 12/24/21 1133   ? ?  ? Check-In  ? Supervising physician immediately available to respond to emergencies See telemetry face sheet for immediately available ER MD   ? Location [facility]-Cardiac & Pulmonary Rehab   ? Staff Present Renita Papa, RN BSN;Melissa [place], RDN, Rowe Pavy, BA, ACSM CEP, Exercise Physiologist   ? Virtual Visit No   ? Medication changes reported     No   ? Fall or balance concerns reported    No   ? Warm-up and Cool-down Performed on first and last piece of equipment   ? Resistance Training Performed Yes   ? VAD Patient? No   ? PAD/SET Patient? No   ?  ? Pain Assessment  ? Currently in Pain? No/denies   ? ?  ?  ? ?  ? ? ? ? ? ?Social History  ? ?Tobacco Use  ?Smoking Status Never  ?Smokeless Tobacco Never  ? ? ?Goals Met:  ?Independence with exercise equipment ?Exercise tolerated well ?No report of concerns or symptoms today ?Strength training completed today ? ?Goals Unmet:  ?Not Applicable ? ?Comments: Pt able to follow exercise prescription today without complaint.  Will continue to monitor for progression. ? ? ? ?Dr. Emily Filbert is Medical Director for [place].  ?Dr. Ottie Glazier is Medical Director for [facility] Pulmonary Rehabilitation. ?

## 2021-12-25 NOTE — Progress Notes (Signed)
? ?Cardiology Office Note   ? ?Date:  12/26/2021  ? ?ID:  Grant Young, DOB 1946/03/01, MRN [id number] ? ?PCP:  [name], NP  ?Cardiologist:  Kate Sable, MD  ?Electrophysiologist:  None  ? ?Chief Complaint: Hospital follow-up ? ?History of Present Illness:  ? ?Grant Young is a 76 y.o. male with history of CAD with MI x2 in 31 and 1999 with PTCA only in [place] with NSTEMI in 11/2021 status post PCI/DES to the LAD and LCx, HFpEF, HTN, HLD, PVCs/NSVT, nephrolithiasis, headaches, GERD, and multiple medication intolerances who presents for hospital follow-up as outlined below. ? ?He was last seen in the office in 12/2019 for follow-up of uncontrolled blood pressure, which had been an ongoing issue for years.  Note indicates he has "a lot of side effects from prior blood pressure medications."  Echo from 2021 demonstrated an EF of 60 to 65%, no regional wall motion abnormalities, grade 1 diastolic dysfunction, normal RV systolic function and ventricular cavity size, mild to moderate aortic valve insufficiency, mild dilatation of the aortic root measuring 40 mm, and an estimated right atrial pressure 3 mmHg. ? ?He was admitted to [facility] in 11/2021 with a high risk NSTEMI and underwent PCI to the LAD and LCx as outlined below.  Mildly reduced LVEF with lateral hypokinesis on LV gram.  See below for cath details.  Post PCI echo demonstrated an EF of 50 to 55%, select images concerning for inferior and lateral wall hypokinesis, indeterminate LV diastolic function parameters, normal RV systolic function and ventricular cavity size, normal PASP, mild mitral valve regurgitation, aorta documented as normal in size and structure, and an estimated right atrial pressure of 3 mmHg. ? ?He comes in accompanied by his wife today and is without symptoms of angina or decompensation.  Since his hospital discharge she has noted a significant amount of fatigue.  He will typically wake up and play on the phone or the computer for a  couple of hours followed by needing to take a nap.  There has also been some associated headache.  Symptoms of fatigue are very similar to when he was previously placed on atorvastatin and subsequently improved off statin therapy.  He is hesitant to continue with statin therapy.  He has previously tolerated ezetimibe twice weekly and is willing to resume this.  He is also followed by an integrative physician in [place].  No presyncope or syncope.  No significant lower extremity swelling.  He is tolerating DAPT.  No falls or symptoms concerning for bleeding.  Blood pressure at home has been well controlled.  He started participating in cardiac rehab and is interested in resuming his water exercise program at his living facility. ? ? ?Labs independently reviewed: ?11/2021 - potassium 3.4, BUN 18, serum creatinine 0.66, Hgb 13.5, PLT 179, magnesium 2.0 ?10/2021 - TC 170, TG 99, HDL 48, LDL 103, albumin 4.3, AST/ALT normal ? ?Past Medical History:  ?Diagnosis Date  ? Anxiety   ? Per New Patient Packet  ? Chronic back pain   ? Per New Patient Packet  ? Chronic heart disease   ? Per New Patient Packet  ? Depression   ? GERD (gastroesophageal reflux disease)   ? Headache   ? History of CT scan of brain 09/14/2018  ? Per New Patient Packet  ? History of depression   ? Per New Patient Packet  ? History of gastritis   ? Per New Patient Packet  ? History of headache   ?  Per New Patient Packet  ? History of kidney stones   ? Per New Patient Packet  ? History of neuropathy   ? Per New Patient Packet  ? Hypertension   ? Per New Patient Packet  ? Insomnia   ? Kidney stone   ? Lumbar radicular pain   ? Per New Patient Packet  ? Lyme disease   ? Per New Patient Packet  ? Myocardial infarction [facility])   ? Overactive bladder   ? PONV (postoperative nausea and vomiting)   ? Skin cancer, basal cell   ? Sleep trouble   ? Per New Patient Packet  ? Spinal cord stimulator status   ? 01/08/21 - not currently using.  ? Squamous cell skin cancer    ? Substance abuse ([place])   ? ? ?Past Surgical History:  ?Procedure Laterality Date  ? BACK SURGERY    ? CARDIAC CATHETERIZATION    ? CATARACT EXTRACTION W/PHACO Left 01/14/2021  ? Procedure: CATARACT EXTRACTION PHACO AND INTRAOCULAR LENS PLACEMENT ([place]) LEFT VIVITY TORIC LENS 8.75 00:56.7;  Surgeon: Birder Robson, MD;  Location: [place];  Service: Ophthalmology;  Laterality: Left;  ? CATARACT EXTRACTION W/PHACO Right 01/28/2021  ? Procedure: CATARACT EXTRACTION PHACO AND INTRAOCULAR LENS PLACEMENT ([place]) RIGHT VIVITY TORIC LENS;  Surgeon: Birder Robson, MD;  Location: [place];  Service: Ophthalmology;  Laterality: Right;  6.54 ?00:46.4  ? CHOLECYSTECTOMY  2017  ? COLONOSCOPY  09/15/2015  ? Per New Patient Packet  ? COLONOSCOPY WITH PROPOFOL N/A 10/03/2020  ? Procedure: COLONOSCOPY WITH PROPOFOL;  Surgeon: [name], MD;  Location: [facility] ENDOSCOPY;  Service: Gastroenterology;  Laterality: N/A;  ? CORONARY/GRAFT ACUTE MI REVASCULARIZATION N/A 11/27/2021  ? Procedure: Coronary/Graft Acute MI Revascularization;  Surgeon: Leonie Man, MD;  Location: [place] CV [facility];  Service: Cardiovascular;  Laterality: N/A;  ? ESOPHAGOGASTRODUODENOSCOPY (EGD) WITH PROPOFOL N/A 10/03/2020  ? Procedure: ESOPHAGOGASTRODUODENOSCOPY (EGD) WITH PROPOFOL;  Surgeon: [name], MD;  Location: [facility] ENDOSCOPY;  Service: Gastroenterology;  Laterality: N/A;  ? FOOT SURGERY Bilateral 03/18/2020  ? FOOT SURGERY  08/032021  ? GALLBLADDER SURGERY  09/15/2015  ? Gallbladder Removal. Procedure done by Dr.Beverly. Per New Patient Packet  ? KIDNEY STONE SURGERY  09/14/1980  ? Too many to count. Per New Patient Packet 09/14/1980-09/15/1995  ? KIDNEY STONE SURGERY  09/15/1995  ? Too many to count. Per New Patient Packet  ? LEFT HEART CATH AND CORONARY ANGIOGRAPHY N/A 11/27/2021  ? Procedure: LEFT HEART CATH AND CORONARY ANGIOGRAPHY;  Surgeon: Leonie Man, MD;  Location: [place] CV LAB;  Service: Cardiovascular;   Laterality: N/A;  ? LITHOTRIPSY  09/14/2014  ? Per New Patient Packet  ? LITHOTRIPSY  09/14/1996  ? No Stints Used. Per New Patient Packet  ? LITHOTRIPSY  09/14/1997  ? No Stints used. Per New Patient Packet  ? PAIN PUMP IMPLANTATION    ? PAIN PUMP REMOVAL    ? SHOULDER SURGERY Right 1984  ? SIGMOIDOSCOPY  09/14/2017  ? Per New Patient Packet  ? SPINAL CORD STIMULATOR IMPLANT    ? TONSILLECTOMY    ? UPPER GI ENDOSCOPY    ? ? ?Current Medications: ?Current Meds  ?Medication Sig  ? amitriptyline (ELAVIL) 10 MG tablet TAKE 3 TABLETS (30 MG TOTAL) BY MOUTH AT BEDTIME. (Patient taking differently: Take 10 mg by mouth at bedtime.)  ? anastrozole (ARIMIDEX) 1 MG tablet 1/2 tablet (0.5 mg) by mouth once weekly  ? aspirin 81 MG chewable tablet Chew 1  tablet (81 mg total) by mouth daily.  ? butalbital-acetaminophen-caffeine (FIORICET) 50-325-40 MG tablet Take 1 tablet by mouth every 6 (six) hours as needed for headache.  ? cetirizine (ZYRTEC) 10 MG tablet Take 10 mg by mouth daily as needed for allergies.  ? Cholecalciferol 50 MCG (2000 UT) CAPS Take 1 tablet by mouth daily.  ? cholestyramine (QUESTRAN) 4 g packet Take 1 packet (4 g total) by mouth 3 (three) times daily. (Patient taking differently: Take 4 g by mouth daily as needed.)  ? Cyanocobalamin (VITAMIN B-12) 5000 MCG LOZG Take 1 lozenge by mouth daily.   ? DHEA 25 MG CAPS Take 25 mg by mouth daily.  ? diazepam (VALIUM) 5 MG tablet 5 mg PO in am and 2 tabs PO QHS  ? dicyclomine (BENTYL) 20 MG tablet Take 20 mg by mouth as needed.  ? Digestive Aids Mixture (DIGESTION GB PO) Take 1 capsule by mouth daily.  ? escitalopram (LEXAPRO) 20 MG tablet TAKE 1 TABLET BY MOUTH EVERY DAY  ? HYDROcodone-acetaminophen (NORCO) 10-325 MG tablet Take 1 tablet by mouth every 8 (eight) hours as needed for severe pain. Must last 30 days.  ? Loperamide HCl (IMODIUM PO) Take by mouth as needed.   ? MAGNESIUM BISGLYCINATE PO Take by mouth.  ? melatonin 5 MG TABS Take 5 mg by mouth at  bedtime.  ? NONFORMULARY OR COMPOUNDED ITEM Super Bi-Mix Injection  ?Papaverine HCI '30mg'$ /mL Phentolamine Mesylate '1mg'$ /mL ?Inject 0.36m - 161mIncrease as needed to achieve erection (Patient taking differently

## 2021-12-26 ENCOUNTER — Encounter: Payer: Self-pay | Admitting: Physician Assistant

## 2021-12-26 ENCOUNTER — Ambulatory Visit (INDEPENDENT_AMBULATORY_CARE_PROVIDER_SITE_OTHER): Payer: Medicare Other | Admitting: Physician Assistant

## 2021-12-26 VITALS — BP 132/78 | HR 51 | Ht 69.0 in | Wt 145.0 lb

## 2021-12-26 DIAGNOSIS — I25110 Atherosclerotic heart disease of native coronary artery with unstable angina pectoris: Secondary | ICD-10-CM | POA: Diagnosis not present

## 2021-12-26 DIAGNOSIS — I2510 Atherosclerotic heart disease of native coronary artery without angina pectoris: Secondary | ICD-10-CM

## 2021-12-26 DIAGNOSIS — I249 Acute ischemic heart disease, unspecified: Secondary | ICD-10-CM | POA: Diagnosis not present

## 2021-12-26 DIAGNOSIS — E785 Hyperlipidemia, unspecified: Secondary | ICD-10-CM | POA: Diagnosis not present

## 2021-12-26 DIAGNOSIS — I10 Essential (primary) hypertension: Secondary | ICD-10-CM

## 2021-12-26 NOTE — Patient Instructions (Addendum)
Medication Instructions:  ?Your physician has recommended you make the following change in your medication:  ? ?STOP Lipitor Atorvastatin ?START Ezetimibe 10 mg twice a week ? ?*If you need a refill on your cardiac medications before your next appointment, please call your pharmacy* ? ? ?Lab Work: ?CBC & BMP ? ?If you have labs (blood work) drawn today and your tests are completely normal, you will receive your results only by: ?MyChart Message (if you have MyChart) OR ?A paper copy in the mail ?If you have any lab test that is abnormal or we need to change your treatment, we will call you to review the results. ? ? ?Testing/Procedures: ?None ? ? ?Follow-Up: ?At [facility], you and your health needs are our priority.  As part of our continuing mission to provide you with exceptional heart care, we have created designated Provider Care Teams.  These Care Teams include your primary Cardiologist (physician) and Advanced Practice Providers (APPs -  Physician Assistants and Nurse Practitioners) who all work together to provide you with the care you need, when you need it. ? ? ?Your next appointment:   ?1 month(s) ? ?The format for your next appointment:   ?In Person ? ?Provider:   ?Kate Sable, MD or Christell Faith, PA-C ? ? ? ? ?Important Information About [place] ? ? ? ? ?  ?

## 2021-12-27 ENCOUNTER — Other Ambulatory Visit: Payer: Self-pay | Admitting: Internal Medicine

## 2021-12-27 LAB — CBC
Hematocrit: 43.5 % (ref 37.5–51.0)
Hemoglobin: 14.4 g/dL (ref 13.0–17.7)
MCH: 29.7 pg (ref 26.6–33.0)
MCHC: 33.1 g/dL (ref 31.5–35.7)
MCV: 90 fL (ref 79–97)
Platelets: 202 10*3/uL (ref 150–450)
RBC: 4.85 x10E6/uL (ref 4.14–5.80)
RDW: 13.3 % (ref 11.6–15.4)
WBC: 5.5 10*3/uL (ref 3.4–10.8)

## 2021-12-27 LAB — BASIC METABOLIC PANEL
BUN/Creatinine Ratio: 17 (ref 10–24)
BUN: 14 mg/dL (ref 8–27)
CO2: 27 mmol/L (ref 20–29)
Calcium: 9.3 mg/dL (ref 8.6–10.2)
Chloride: 104 mmol/L (ref 96–106)
Creatinine, Ser: 0.82 mg/dL (ref 0.76–1.27)
Glucose: 75 mg/dL (ref 70–99)
Potassium: 4.2 mmol/L (ref 3.5–5.2)
Sodium: 146 mmol/L — ABNORMAL HIGH (ref 134–144)
eGFR: 93 mL/min/{1.73_m2} (ref >59)

## 2021-12-29 ENCOUNTER — Other Ambulatory Visit: Payer: Self-pay | Admitting: Internal Medicine

## 2021-12-29 ENCOUNTER — Encounter: Payer: Medicare Other | Admitting: *Deleted

## 2021-12-29 DIAGNOSIS — Z955 Presence of coronary angioplasty implant and graft: Secondary | ICD-10-CM

## 2021-12-29 DIAGNOSIS — I252 Old myocardial infarction: Secondary | ICD-10-CM | POA: Diagnosis not present

## 2021-12-29 DIAGNOSIS — I214 Non-ST elevation (NSTEMI) myocardial infarction: Secondary | ICD-10-CM

## 2021-12-29 NOTE — Progress Notes (Signed)
Daily Session Note ? ?Patient Details  ?Name: Grant Young ?MRN: [id number] ?Date of Birth: [birth year] ?Referring Provider:   ?Flowsheet Row Cardiac Rehab from 12/18/2021 in [facility] Cardiac and Pulmonary Rehab  ?Referring Provider Ellyn Hack  ? ?  ? ? ?Encounter Date: 12/29/2021 ? ?Check In: ? Session Check In - 12/29/21 1057   ? ?  ? Check-In  ? Supervising physician immediately available to respond to emergencies See telemetry face sheet for immediately available ER MD   ? Location [facility]-Cardiac & Pulmonary Rehab   ? Staff Present Renita Papa, RN Moises Blood, BS, ACSM CEP, Exercise Physiologist;Amanda Oletta Darter, BA, ACSM CEP, Exercise Physiologist;Kelly Rosalia Hammers, MPA, RN   ? Virtual Visit No   ? Medication changes reported     No   ? Fall or balance concerns reported    No   ? Warm-up and Cool-down Performed on first and last piece of equipment   ? Resistance Training Performed Yes   ? VAD Patient? No   ? PAD/SET Patient? No   ?  ? Pain Assessment  ? Currently in Pain? No/denies   ? ?  ?  ? ?  ? ? ? ? ? ?Social History  ? ?Tobacco Use  ?Smoking Status Never  ?Smokeless Tobacco Never  ? ? ?Goals Met:  ?Independence with exercise equipment ?Exercise tolerated well ?No report of concerns or symptoms today ?Strength training completed today ? ?Goals Unmet:  ?Not Applicable ? ?Comments: Pt able to follow exercise prescription today without complaint.  Will continue to monitor for progression. ? ? ? ?Dr. Emily Filbert is Medical Director for [place].  ?Dr. Ottie Glazier is Medical Director for [facility] Pulmonary Rehabilitation. ?

## 2021-12-29 NOTE — Telephone Encounter (Signed)
Requested medication (s) are due for refill today: yes ? ?Requested medication (s) are on the active medication list: yes ? ?Last refill:  11/28/21 #90 with 0 RF ? ?Future visit scheduled: no, last seen 12/16/21 ? ?Notes to clinic:  This medication can not be delegated, please assess.  ? ? ? ?  ? ?Requested Prescriptions  ?Pending Prescriptions Disp Refills  ? diazepam (VALIUM) 5 MG tablet 90 tablet 0  ?  Sig: 5 mg PO in am and 2 tabs PO QHS  ?  ? Not Delegated - Psychiatry: Anxiolytics/Hypnotics 2 Failed - 12/29/2021 11:07 AM  ?  ?  Failed - This refill cannot be delegated  ?  ?  Failed - Urine Drug Screen completed in last 360 days  ?  ?  Passed - Patient is not pregnant  ?  ?  Passed - Valid encounter within last 6 months  ?  Recent Outpatient Visits   ? ?      ? 1 week ago Non-ST elevation (NSTEMI) myocardial infarction [facility])  ? [place], NP  ? 1 month ago Excessive daytime sleepiness  ? [facility] [place], [place] W, NP  ? 3 months ago Anxiety and depression  ? [facility] [place], [place] W, NP  ? 5 months ago Dizziness  ? [facility] [place], [place] W, NP  ? 9 months ago HTN (hypertension), benign  ? [facility] [place], [name], NP  ? ?  ?  ?Future Appointments   ? ?        ? In 1 month Dunn, Areta Haber, PA-C [facility]  ? ?  ? ? ?  ?  ?  ? ? ?

## 2021-12-29 NOTE — Telephone Encounter (Signed)
Requested Prescriptions  ?Pending Prescriptions Disp Refills  ?? amitriptyline (ELAVIL) 10 MG tablet [Pharmacy Med Name: AMITRIPTYLINE HCL 10 MG TAB] 270 tablet 0  ?  Sig: TAKE 3 TABLETS BY MOUTH AT BEDTIME  ?  ? Psychiatry:  Antidepressants - Heterocyclics (TCAs) Passed - 12/27/2021  9:26 AM  ?  ?  Passed - Completed PHQ-2 or PHQ-9 in the last 360 days  ?  ?  Passed - Valid encounter within last 6 months  ?  Recent Outpatient Visits   ?      ? 1 week ago Non-ST elevation (NSTEMI) myocardial infarction [facility])  ? [place], NP  ? 1 month ago Excessive daytime sleepiness  ? [facility] [place], [place] W, NP  ? 3 months ago Anxiety and depression  ? [facility] [name], [place] W, NP  ? 5 months ago Dizziness  ? [facility] [place], [place] W, NP  ? 9 months ago HTN (hypertension), benign  ? [facility] [place], [name], NP  ?  ?  ?Future Appointments   ?        ? In 1 month Dunn, Areta Haber, PA-C [facility], [facility]  ?  ? ?  ?  ?  ?? RABEprazole (ACIPHEX) 20 MG tablet [Pharmacy Med Name: RABEPRAZOLE SOD DR 20 MG TAB] 180 tablet 0  ?  Sig: TAKE 1 TABLET BY MOUTH TWICE A DAY  ?  ? Gastroenterology: Proton Pump Inhibitors Passed - 12/27/2021  9:26 AM  ?  ?  Passed - Valid encounter within last 12 months  ?  Recent Outpatient Visits   ?      ? 1 week ago Non-ST elevation (NSTEMI) myocardial infarction [facility])  ? [place], NP  ? 1 month ago Excessive daytime sleepiness  ? [facility] [place], [place] W, NP  ? 3 months ago Anxiety and depression  ? [facility] [place], [place] W, NP  ? 5 months ago Dizziness  ? [facility] [place], [place] W, NP  ? 9 months ago HTN (hypertension), benign  ? [facility] [place], [name], NP  ?  ?  ?Future Appointments   ?        ? In 1 month Dunn, Areta Haber, PA-C [facility], [facility]  ?   ? ?  ?  ?  ? ?

## 2021-12-29 NOTE — Telephone Encounter (Signed)
Medication Refill - Medication: diazepam (VALIUM) 5 MG tablet ? ?Pt says he is completely out of his current supply  ? ?Has the patient contacted their pharmacy? Yes.   ?(Agent: If no, request that the patient contact the pharmacy for the refill. If patient does not wish to contact the pharmacy document the reason why and proceed with request.) ?(Agent: If yes, when and what did the pharmacy advise?) ? ?Preferred Pharmacy (with phone number or street name):  ?[id number] [address] ?Phone: [phone]: [phone] ? ?Has the patient been seen for an appointment in the last year OR does the patient have an upcoming appointment? Yes.   ? ?Agent: Please be advised that RX refills may take up to 3 business days. We ask that you follow-up with your pharmacy. ? ?

## 2021-12-30 MED ORDER — DIAZEPAM 5 MG PO TABS: ORAL_TABLET | ORAL | 0 refills | Status: DC

## 2021-12-31 ENCOUNTER — Encounter: Payer: Self-pay | Admitting: *Deleted

## 2021-12-31 DIAGNOSIS — Z955 Presence of coronary angioplasty implant and graft: Secondary | ICD-10-CM

## 2021-12-31 DIAGNOSIS — I214 Non-ST elevation (NSTEMI) myocardial infarction: Secondary | ICD-10-CM

## 2021-12-31 DIAGNOSIS — I252 Old myocardial infarction: Secondary | ICD-10-CM | POA: Diagnosis not present

## 2021-12-31 NOTE — Progress Notes (Signed)
Daily Session Note ? ?Patient Details  ?Name: Grant Young ?MRN: [id number] ?Date of Birth: 25-Nov-1945 ?Referring Provider:   ?Flowsheet Row Cardiac Rehab from 12/18/2021 in [facility] Cardiac and Pulmonary Rehab  ?Referring Provider Ellyn Hack  ? ?  ? ? ?Encounter Date: 12/31/2021 ? ?Check In: ? Session Check In - 12/31/21 1031   ? ?  ? Check-In  ? Supervising physician immediately available to respond to emergencies See telemetry face sheet for immediately available ER MD   ? Location [facility]-Cardiac & Pulmonary Rehab   ? Staff Present Birdie Sons, MPA, RN;Joseph Tessie Fass, Lindell Spar, BA, ACSM CEP, Exercise Physiologist;Melissa [place], RDN, LDN   ? Virtual Visit No   ? Medication changes reported     No   ? Fall or balance concerns reported    No   ? Warm-up and Cool-down Performed on first and last piece of equipment   ? Resistance Training Performed Yes   ? VAD Patient? No   ? PAD/SET Patient? No   ?  ? Pain Assessment  ? Currently in Pain? No/denies   ? ?  ?  ? ?  ? ? ? ? ? ?Social History  ? ?Tobacco Use  ?Smoking Status Never  ?Smokeless Tobacco Never  ? ? ?Goals Met:  ?Independence with exercise equipment ?Exercise tolerated well ?No report of concerns or symptoms today ?Strength training completed today ? ?Goals Unmet:  ?Not Applicable ? ?Comments: Pt able to follow exercise prescription today without complaint.  Will continue to monitor for progression. ? ? ? ?Dr. Emily Filbert is Medical Director for [place].  ?Dr. Ottie Glazier is Medical Director for [facility] Pulmonary Rehabilitation. ?

## 2021-12-31 NOTE — Progress Notes (Signed)
Cardiac Individual Treatment Plan ? ?Patient Details  ?Name: Grant Young ?MRN: [id number] ?Date of Birth: [birth year] ?Referring Provider:   ?Flowsheet Row Cardiac Rehab from 12/18/2021 in [facility] Cardiac and Pulmonary Rehab  ?Referring Provider Ellyn Hack  ? ?  ? ? ?Initial Encounter Date:  ?Flowsheet Row Cardiac Rehab from 12/18/2021 in [facility] Cardiac and Pulmonary Rehab  ?Date 12/18/21  ? ?  ? ? ?Visit Diagnosis: NSTEMI (non-ST elevated myocardial infarction) ([place]) ? ?Status post coronary artery stent placement ? ?Patient's Home Medications on Admission: ? ?Current Outpatient Medications:  ?  amitriptyline (ELAVIL) 10 MG tablet, TAKE 3 TABLETS BY MOUTH AT BEDTIME, Disp: 270 tablet, Rfl: 0 ?  anastrozole (ARIMIDEX) 1 MG tablet, 1/2 tablet (0.5 mg) by mouth once weekly, Disp: , Rfl:  ?  aspirin 81 MG chewable tablet, Chew 1 tablet (81 mg total) by mouth daily., Disp: 90 tablet, Rfl: 3 ?  butalbital-acetaminophen-caffeine (FIORICET) 50-325-40 MG tablet, Take 1 tablet by mouth every 6 (six) hours as needed for headache., Disp: , Rfl:  ?  cetirizine (ZYRTEC) 10 MG tablet, Take 10 mg by mouth daily as needed for allergies., Disp: , Rfl:  ?  chlorthalidone (HYGROTON) 25 MG tablet, TAKE 1 TABLET (25 MG TOTAL) BY MOUTH DAILY. (Patient not taking: Reported on 12/26/2021), Disp: 90 tablet, Rfl: 0 ?  Cholecalciferol 50 MCG (2000 UT) CAPS, Take 1 tablet by mouth daily., Disp: , Rfl:  ?  cholestyramine (QUESTRAN) 4 g packet, Take 1 packet (4 g total) by mouth 3 (three) times daily. (Patient taking differently: Take 4 g by mouth daily as needed.), Disp: 270 packet, Rfl: 3 ?  Cyanocobalamin (VITAMIN B-12) 5000 MCG LOZG, Take 1 lozenge by mouth daily. , Disp: , Rfl:  ?  DHEA 25 MG CAPS, Take 25 mg by mouth daily., Disp: , Rfl:  ?  diazepam (VALIUM) 5 MG tablet, 5 mg PO in am and 2 tabs PO QHS, Disp: 90 tablet, Rfl: 0 ?  dicyclomine (BENTYL) 20 MG tablet, Take 20 mg by mouth as needed., Disp: , Rfl:  ?  Digestive Aids Mixture (DIGESTION GB  PO), Take 1 capsule by mouth daily., Disp: , Rfl:  ?  escitalopram (LEXAPRO) 20 MG tablet, TAKE 1 TABLET BY MOUTH EVERY DAY, Disp: 90 tablet, Rfl: 1 ?  ezetimibe (ZETIA) 10 MG tablet, Take 1 tablet (10 mg total) by mouth daily. (Patient not taking: Reported on 12/12/2021), Disp: 90 tablet, Rfl: 1 ?  Loperamide HCl (IMODIUM PO), Take by mouth as needed. , Disp: , Rfl:  ?  MAGNESIUM BISGLYCINATE PO, Take by mouth., Disp: , Rfl:  ?  melatonin 5 MG TABS, Take 5 mg by mouth at bedtime., Disp: , Rfl:  ?  NONFORMULARY OR COMPOUNDED ITEM, Super Bi-Mix Injection  Papaverine HCI '30mg'$ /mL Phentolamine Mesylate '1mg'$ /mL Inject 0.72m - 120mIncrease as needed to achieve erection (Patient taking differently: Super Bi-Mix Injection '150mg'$ /'5mg'$  Papaverine HCI '30mg'$ /mL Phentolamine Mesylate '1mg'$ /mL Inject 0.50m77m 1mL9mcrease as needed to achieve erection), Disp: 5 each, Rfl: 6 ?  Nutritional Supplements (NUTRITIONAL SUPPLEMENT PO), Take by mouth. [name], Disp: , Rfl:  ?  Nutritional Supplements (NUTRITIONAL SUPPLEMENT PO), Take by mouth. [name], Disp: , Rfl:  ?  ondansetron (ZOFRAN) 4 MG tablet, Take 4 mg by mouth every 6 (six) hours as needed for nausea or vomiting., Disp: , Rfl:  ?  polyethylene glycol (MIRALAX / GLYCOLAX) 17 g packet, Take 17 g by mouth daily as needed., Disp: , Rfl:  ?  Potassium 99 MG TABS, Take by mouth 3 (three) times a week. K-Zyme (Patient not taking: Reported on 12/26/2021), Disp: , Rfl:  ?  Probiotic Product (PROBIOTIC DAILY PO), Take by mouth., Disp: , Rfl:  ?  prochlorperazine (COMPAZINE) 10 MG tablet, Take 10 mg by mouth. TAKE 1 TABLET (10 MG TOTAL) BY MOUTH EVERY 6 (SIX) HOURS AS NEEDED for HEADACHE AND NAUSEA, LIMIT USE TO 3 DAYS PER WEEK, Disp: , Rfl:  ?  propranolol (INDERAL) 40 MG tablet, Take 1 tablet (40 mg total) by mouth 2 (two) times daily., Disp: 180 tablet, Rfl: 0 ?  pyridostigmine (MESTINON) 60 MG tablet, Take 1 tablet (60 mg total) by mouth 2 (two) times daily., Disp: 180  tablet, Rfl: 0 ?  RABEprazole (ACIPHEX) 20 MG tablet, TAKE 1 TABLET BY MOUTH TWICE A DAY, Disp: 180 tablet, Rfl: 0 ?  sucralfate (CARAFATE) 1 g tablet, Take 1 g by mouth 4 (four) times daily as needed. , Disp: , Rfl:  ?  testosterone cypionate (DEPOTESTOSTERONE CYPIONATE) 200 MG/ML injection, Inject 80 mg into the muscle every 14 (fourteen) days., Disp: , Rfl:  ?  ticagrelor (BRILINTA) 90 MG TABS tablet, Take 1 tablet (90 mg total) by mouth 2 (two) times daily., Disp: 180 tablet, Rfl: 3 ?  valACYclovir (VALTREX) 1000 MG tablet, Take 2 tabs p.o. and repeat in 12 hours as needed for cold sore, Disp: 30 tablet, Rfl: 0 ?  Vibegron (GEMTESA) 75 MG TABS, Take 75 mg by mouth daily. (Patient not taking: Reported on 12/26/2021), Disp: 90 tablet, Rfl: 3 ? ?Past Medical History: ?Past Medical History:  ?Diagnosis Date  ? Anxiety   ? Per New Patient Packet  ? Chronic back pain   ? Per New Patient Packet  ? Chronic heart disease   ? Per New Patient Packet  ? Depression   ? GERD (gastroesophageal reflux disease)   ? Headache   ? History of CT scan of brain 09/14/2018  ? Per New Patient Packet  ? History of depression   ? Per New Patient Packet  ? History of gastritis   ? Per New Patient Packet  ? History of headache   ? Per New Patient Packet  ? History of kidney stones   ? Per New Patient Packet  ? History of neuropathy   ? Per New Patient Packet  ? Hypertension   ? Per New Patient Packet  ? Insomnia   ? Kidney stone   ? Lumbar radicular pain   ? Per New Patient Packet  ? Lyme disease   ? Per New Patient Packet  ? Myocardial infarction [facility])   ? Overactive bladder   ? PONV (postoperative nausea and vomiting)   ? Skin cancer, basal cell   ? Sleep trouble   ? Per New Patient Packet  ? Spinal cord stimulator status   ? 01/08/21 - not currently using.  ? Squamous cell skin cancer   ? Substance abuse ([place])   ? ? ?Tobacco Use: ?Social History  ? ?Tobacco Use  ?Smoking Status Never  ?Smokeless Tobacco Never  ? ? ?Labs: ?Review Flowsheet    ? ?  ?  Latest Ref Rng & Units 08/24/2019 10/17/2019 05/16/2020 11/11/2021  ?Labs for [facility] Cardiac and Pulmonary Rehab  ?Cholestrol <200 mg/dL 168       170   170    ?LDL (calc) mg/dL (calc) 96       100   103    ?HDL-C > OR = 40  mg/dL 48       56   48    ?Trlycerides <150 mg/dL 121       59   99    ?Hemoglobin A1c   5.1         ?  ? ? This result is from an external source.  ? Multiple values from one day are sorted in reverse-chronological order  ?  ?  ? ? ? ?Exercise Target Goals: ?Exercise Program Goal: ?Individual exercise prescription set using results from initial 6 min walk test and THRR while considering  patient?s activity barriers and safety.  ? ?Exercise Prescription Goal: ?Initial exercise prescription builds to 30-45 minutes a day of aerobic activity, 2-3 days per week.  Home exercise guidelines will be given to patient during program as part of exercise prescription that the participant will acknowledge. ? ? ?Education: Aerobic Exercise: ?- Group verbal and visual presentation on the components of exercise prescription. Introduces F.I.T.T principle from ACSM for exercise prescriptions.  Reviews F.I.T.T. principles of aerobic exercise including progression. Written material given at graduation. ? ? ?Education: Resistance Exercise: ?- Group verbal and visual presentation on the components of exercise prescription. Introduces F.I.T.T principle from ACSM for exercise prescriptions  Reviews F.I.T.T. principles of resistance exercise including progression. Written material given at graduation. ? ?  ?Education: Exercise & Equipment Safety: ?- Individual verbal instruction and demonstration of equipment use and safety with use of the equipment. ?Flowsheet Row Cardiac Rehab from 12/31/2021 in [facility] Cardiac and Pulmonary Rehab  ?Date 12/18/21  ?Educator AS  ?Instruction Review Code 1- Verbalizes Understanding  ? ?  ? ? ?Education: Exercise Physiology & General Exercise Guidelines: ?- Group verbal and written  instruction with models to review the exercise physiology of the cardiovascular system and associated critical values. Provides general exercise guidelines with specific guidelines to those with heart or lung disea

## 2022-01-01 ENCOUNTER — Telehealth: Payer: Self-pay

## 2022-01-01 NOTE — Telephone Encounter (Signed)
Lm for patient to ask if he has had previous sleep study.  

## 2022-01-02 ENCOUNTER — Encounter: Payer: Medicare Other | Admitting: *Deleted

## 2022-01-02 DIAGNOSIS — I252 Old myocardial infarction: Secondary | ICD-10-CM | POA: Diagnosis not present

## 2022-01-02 DIAGNOSIS — Z955 Presence of coronary angioplasty implant and graft: Secondary | ICD-10-CM

## 2022-01-02 DIAGNOSIS — I214 Non-ST elevation (NSTEMI) myocardial infarction: Secondary | ICD-10-CM

## 2022-01-02 NOTE — Telephone Encounter (Signed)
Spoke to patient. He stated that he had a sleep study over 25 years and he does not currently wear cpap machine.  ?

## 2022-01-02 NOTE — Progress Notes (Signed)
Daily Session Note ? ?Patient Details  ?Name: Grant Young ?MRN: [id number] ?Date of Birth: [birth year] ?Referring Provider:   ?Flowsheet Row Cardiac Rehab from 12/18/2021 in [facility] Cardiac and Pulmonary Rehab  ?Referring Provider Ellyn Hack  ? ?  ? ? ?Encounter Date: 01/02/2022 ? ?Check In: ? Session Check In - 01/02/22 1105   ? ?  ? Check-In  ? Supervising physician immediately available to respond to emergencies See telemetry face sheet for immediately available ER MD   ? Location [facility]-Cardiac & Pulmonary Rehab   ? Staff Present Renita Papa, RN BSN;Joseph Clyde Hill, RCP,RRT,BSRT;Jessica [place], [place], [place], [place], CCET   ? Virtual Visit No   ? Medication changes reported     No   ? Fall or balance concerns reported    No   ? Warm-up and Cool-down Performed on first and last piece of equipment   ? Resistance Training Performed Yes   ? VAD Patient? No   ? PAD/SET Patient? No   ?  ? Pain Assessment  ? Currently in Pain? No/denies   ? ?  ?  ? ?  ? ? ? ? ? ?Social History  ? ?Tobacco Use  ?Smoking Status Never  ?Smokeless Tobacco Never  ? ? ?Goals Met:  ?Independence with exercise equipment ?Exercise tolerated well ?No report of concerns or symptoms today ?Strength training completed today ? ?Goals Unmet:  ?Not Applicable ? ?Comments: Pt able to follow exercise prescription today without complaint.  Will continue to monitor for progression. ? ? ? ?Dr. Emily Filbert is Medical Director for [facility].  ?Dr. Ottie Glazier is Medical Director for [facility] Pulmonary Rehabilitation. ?

## 2022-01-05 ENCOUNTER — Ambulatory Visit (INDEPENDENT_AMBULATORY_CARE_PROVIDER_SITE_OTHER): Payer: Medicare Other | Admitting: Primary Care

## 2022-01-05 ENCOUNTER — Encounter: Payer: Self-pay | Admitting: Primary Care

## 2022-01-05 VITALS — BP 138/80 | HR 60 | Temp 97.7°F | Ht 69.0 in | Wt 142.0 lb

## 2022-01-05 DIAGNOSIS — R0683 Snoring: Secondary | ICD-10-CM

## 2022-01-05 DIAGNOSIS — R0681 Apnea, not elsewhere classified: Secondary | ICD-10-CM | POA: Insufficient documentation

## 2022-01-05 NOTE — Progress Notes (Signed)
? ?'@Patient'$  ID: Grant Young, male    DOB: April 25, 1946, 76 y.o.   MRN: [id number] ? ?Chief Complaint  ?Patient presents with  ? sleep consult  ?  Prior sleep study >20y ago-neg for OSA. C/o daytime sleepiness, loud snoring and restless sleep.   ? ? ?Referring provider: ?[name], NP ? ?HPI: ?76 year old male, never smoked.  Past medical history significant for hypertension, coronary artery disease, chronic migraine, GERD, hyperlipidemia, chronic pain syndrome, restless leg syndrome, tremor, spinal cord stimulator. ? ?01/05/2022 ?Patient presents today for sleep consult.  He had a sleep study done over 20 years ago that he reports was negative for sleep apnea.  He is having symptoms of loud snoring, restless sleep, daytime sleepiness. He has a sleep number smart bed that has shown restless sleep. When he is able to fall asleep it takes him less than 10 min. He gets about 7 hours of sleep. He can wake up anywhere from 1-4 times a night. His wife has witnessed him stop breathing while he is sleeping. He does not wake up feeling rested. He has chronic migraines and back troubles. He will lay down on the couch and fall asleep late morning. He may or may not go back to sleep after lunch until 4-5pm. After dinner he is usually awake until bedtime. Cardiologist took him off lipitor to see if this would help with drowsiness. He takes valium at bedtime for restless leg syndrome and peripheral neuropathy.  ? ?Sleep questionnaire ?Symptoms- snoring, restless sleep, daytime sleepiness/ non-restorative sleep ?Prior sleep study- > 20 years ago ?Bedtime- 11:30-12am  ?Time to fall asleep- usually <10 mins (can take up to an hour) ?Nocturnal awakenings- 1-4 times  ?Start of day/out of bed- 8am  ?Weight changes- fluctuates  ?CPAP- No ?Oxygen- No ?Epworth-9 ? ? ?Allergies  ?Allergen Reactions  ? Ace Inhibitors   ?  Other reaction(s): Cough  ? Fluoxetine Anxiety  ?  "made me fall asleep" per pt ?"bad headaches and "makes  Me   Crazy" ?historical allergy noted in [employer] ?"made me fall asleep" per pt ?"bad headaches and "makes  Me  Crazy" ?Per New Patient Packet.  ?  ? Metoclopramide   ?  Other reaction(s): Other (See Comments), Other (See Comments), Unknown ?Tardive Dyskinesia  ?historical allergy noted in [employer] ?Tardive Dyskinesia  ?Per New Patient Packet.  ? Nalbuphine   ?  Used Post Back surgery- Anesthesiologist Error. Patient had Narcotic Withdraw. Per New Patient Packet.   ? Other   ?  Other reaction(s): Other (See Comments) ?Altered mental status in combo with narcotics at previous hospitalization - Full Withdrawal Symptoms ?Other reaction(s): Rash  ? Amoxicillin-Pot Clavulanate Nausea Only  ?  Per New Patient Packet.  ? Doxazosin Rash  ?  Other reaction(s): Other - See Comments, Rash ?UNKNOWN REACTION ?UNKNOWN REACTION ?  ? Duloxetine Nausea Only  ?  Per New Patient Packet.   ? Penicillins Nausea Only  ?  Per New Patient Packet.  ? Tamsulosin Itching and Anxiety  ?  Restless, Flushing, Heavy Chest, Itching, Hyperactive mood and Anxiety. Unable to handle side effects. Per New Patient Packet.  ?  ? Trazodone And Nefazodone Itching, Anxiety and Rash  ?  Headache. ?"INCREASED MY ANXIETY AND HEARTRATE" ?Flushing, tachycardia ?"INCREASED MY ANXIETY AND HEARTRATE" ?Per New Patient Packet. ?  ? Amlodipine   ?  Shaking, unsure of reaction. Per New Patient Packet.  ?  ? Cinoxacin   ?  GI Intolerance, and Dizziness. Per New  Patient Packet.   ? Ciprofloxacin   ?  Other reaction(s): Unknown  ? Fludrocortisone Other (See Comments)  ?  "Worsening headaches, GI issues, fatigue"  ? Nebivolol   ?  Other reaction(s): Unknown  ? Olanzapine   ?  Headache and unable to sleep for 3 nights. Per New Patient Packet. ?  ? Olmesartan   ?  Other reaction(s): Unknown  ? Pregabalin   ?  Confusion, Lack of concentration, dizziness, and likely drowsiness. Per New Patient Packet.  ?  ? Prostaglandins   ?  Other reaction(s): Other (See Comments) ?Intolerance  ?  Thyroid Hormones   ?  Other reaction(s): Other (See Comments) ?Thyroid (Nature Thyroid) contraindicated with some of your other medications.  ? Zolpidem   ?  Nightmares, Ineffective after 2 days. Per New Patient Packet.   ? Duloxetine Hcl   ?  Other reaction(s): Rash  ? Fluoxetine Hcl   ?  Other reaction(s): Rash  ? Nucynta [Tapentadol] Other (See Comments)  ?  Vertigo   ? Phenytoin Anxiety  ?  Hyperactivity, and Ineffective. Per New Patient Packet.   ? ? ?Immunization History  ?Administered Date(s) Administered  ? Influenza Nasal 07/31/2004, 05/21/2010, 07/06/2014, 07/03/2015  ? Influenza-Unspecified 09/15/2015, 09/14/2016, 06/15/2018  ? Moderna Sars-Covid-2 Vaccination 10/02/2019, 10/26/2019, 07/30/2020  ? Pneumococcal Conjugate-13 09/16/2005, 12/20/2013  ? Pneumococcal Polysaccharide-23 02/16/2017  ? Tdap 03/02/2005, 07/30/2020  ? Zoster Recombinat (Shingrix) 03/14/2017, 06/14/2018  ? Zoster, Live 09/15/2015  ? ? ?Past Medical History:  ?Diagnosis Date  ? Anxiety   ? Per New Patient Packet  ? Chronic back pain   ? Per New Patient Packet  ? Chronic heart disease   ? Per New Patient Packet  ? Depression   ? GERD (gastroesophageal reflux disease)   ? Headache   ? History of CT scan of brain 09/14/2018  ? Per New Patient Packet  ? History of depression   ? Per New Patient Packet  ? History of gastritis   ? Per New Patient Packet  ? History of headache   ? Per New Patient Packet  ? History of kidney stones   ? Per New Patient Packet  ? History of neuropathy   ? Per New Patient Packet  ? Hypertension   ? Per New Patient Packet  ? Insomnia   ? Kidney stone   ? Lumbar radicular pain   ? Per New Patient Packet  ? Lyme disease   ? Per New Patient Packet  ? Myocardial infarction [facility])   ? Overactive bladder   ? PONV (postoperative nausea and vomiting)   ? Skin cancer, basal cell   ? Sleep trouble   ? Per New Patient Packet  ? Spinal cord stimulator status   ? 01/08/21 - not currently using.  ? Squamous cell skin cancer   ?  Substance abuse ([place])   ? ? ?Tobacco History: ?Social History  ? ?Tobacco Use  ?Smoking Status Never  ?Smokeless Tobacco Never  ? ?Counseling given: Not Answered ? ? ?Outpatient Medications Prior to Visit  ?Medication Sig Dispense Refill  ? amitriptyline (ELAVIL) 10 MG tablet TAKE 3 TABLETS BY MOUTH AT BEDTIME 270 tablet 0  ? anastrozole (ARIMIDEX) 1 MG tablet 1/2 tablet (0.5 mg) by mouth once weekly    ? aspirin 81 MG chewable tablet Chew 1 tablet (81 mg total) by mouth daily. 90 tablet 3  ? butalbital-acetaminophen-caffeine (FIORICET) 50-325-40 MG tablet Take 1 tablet by mouth every 6 (six) hours as needed for  headache.    ? cetirizine (ZYRTEC) 10 MG tablet Take 10 mg by mouth daily as needed for allergies.    ? chlorthalidone (HYGROTON) 25 MG tablet TAKE 1 TABLET (25 MG TOTAL) BY MOUTH DAILY. 90 tablet 0  ? Cholecalciferol 50 MCG (2000 UT) CAPS Take 1 tablet by mouth daily.    ? cholestyramine (QUESTRAN) 4 g packet Take 1 packet (4 g total) by mouth 3 (three) times daily. (Patient taking differently: Take 4 g by mouth daily as needed.) 270 packet 3  ? Cyanocobalamin (VITAMIN B-12) 5000 MCG LOZG Take 1 lozenge by mouth daily.     ? DHEA 25 MG CAPS Take 25 mg by mouth daily.    ? diazepam (VALIUM) 5 MG tablet 5 mg PO in am and 2 tabs PO QHS 90 tablet 0  ? dicyclomine (BENTYL) 20 MG tablet Take 20 mg by mouth as needed.    ? Digestive Aids Mixture (DIGESTION GB PO) Take 1 capsule by mouth daily.    ? escitalopram (LEXAPRO) 20 MG tablet TAKE 1 TABLET BY MOUTH EVERY DAY 90 tablet 1  ? ezetimibe (ZETIA) 10 MG tablet Take 1 tablet (10 mg total) by mouth daily. 90 tablet 1  ? Loperamide HCl (IMODIUM PO) Take by mouth as needed.     ? MAGNESIUM BISGLYCINATE PO Take by mouth.    ? melatonin 5 MG TABS Take 5 mg by mouth at bedtime.    ? NONFORMULARY OR COMPOUNDED ITEM Super Bi-Mix Injection  ?Papaverine HCI '30mg'$ /mL Phentolamine Mesylate '1mg'$ /mL ?Inject 0.59m - 159mIncrease as needed to achieve erection (Patient taking  differently: Super Bi-Mix Injection '150mg'$ /'5mg'$  ?Papaverine HCI '30mg'$ /mL Phentolamine Mesylate '1mg'$ /mL ?Inject 0.73m86m 1mL73mcrease as needed to achieve erection) 5 each 6  ? Nutritional Supplements (NUTRITIONAL SU

## 2022-01-05 NOTE — Assessment & Plan Note (Addendum)
-  Patient has symptoms of loud snoring, restless sleep, RLS, witnessed apnea, non-restorative sleep and daytime sleepiness. Epworth 9. BMI 20. Concern patient could have obstructive sleep apnea, needs in-lab polysomnography to evaluate for OSA and RLS. Discussed risk of untreated sleep apnea including cardiac arrhythmias, pulm HTN, stroke, DM. We briefly reviewed treatment options. Encouraged patient to focus on side sleeping position/elevate head of bed. Advised against driving if experiencing excessive daytime sleepiness. Follow-up in 4-6 weeks to review sleep study results and discuss treatment options further. ?  ?

## 2022-01-05 NOTE — Patient Instructions (Signed)
Recommendations: ?Maintain normal BMI ?Do not drive if experiencing excessive daytime sleepiness ?Focus on side sleeping position OR elevate head of bed at night  ? ?Orders: ?In lab sleep study  ? ?Follow-up: ?4-6 weeks to review sleep study/ OR call after study is done to schedule visit to review results and treatment options  ? ?Sleep Apnea ?Sleep apnea is a condition in which breathing pauses or becomes shallow during sleep. People with sleep apnea usually snore loudly. They may have times when they gasp and stop breathing for 10 seconds or more during sleep. This may happen many times during the night. ?Sleep apnea disrupts your sleep and keeps your body from getting the rest that it needs. This condition can increase your risk of certain health problems, including: ?Heart attack. ?Stroke. ?Obesity. ?Type 2 diabetes. ?Heart failure. ?Irregular heartbeat. ?High blood pressure. ?The goal of treatment is to help you breathe normally again. ?What are the causes? ? ?The most common cause of sleep apnea is a collapsed or blocked airway. ?There are three kinds of sleep apnea: ?Obstructive sleep apnea. This kind is caused by a blocked or collapsed airway. ?Central sleep apnea. This kind happens when the part of the brain that controls breathing does not send the correct signals to the muscles that control breathing. ?Mixed sleep apnea. This is a combination of obstructive and central sleep apnea. ?What increases the risk? ?You are more likely to develop this condition if you: ?Are overweight. ?Smoke. ?Have a smaller than normal airway. ?Are older. ?Are male. ?Drink alcohol. ?Take sedatives or tranquilizers. ?Have a family history of sleep apnea. ?Have a tongue or tonsils that are larger than normal. ?What are the signs or symptoms? ?Symptoms of this condition include: ?Trouble staying asleep. ?Loud snoring. ?Morning headaches. ?Waking up gasping. ?Dry mouth or sore throat in the morning. ?Daytime sleepiness and  tiredness. ?If you have daytime fatigue because of sleep apnea, you may be more likely to have: ?Trouble concentrating. ?Forgetfulness. ?Irritability or mood swings. ?Personality changes. ?Feelings of depression. ?Sexual dysfunction. This may include loss of interest if you are male, or erectile dysfunction if you are male. ?How is this diagnosed? ?This condition may be diagnosed with: ?A medical history. ?A physical exam. ?A series of tests that are done while you are sleeping (sleep study). These tests are usually done in a sleep lab, but they may also be done at home. ?How is this treated? ?Treatment for this condition aims to restore normal breathing and to ease symptoms during sleep. It may involve managing health issues that can affect breathing, such as high blood pressure or obesity. Treatment may include: ?Sleeping on your side. ?Using a decongestant if you have nasal congestion. ?Avoiding the use of depressants, including alcohol, sedatives, and narcotics. ?Losing weight if you are overweight. ?Making changes to your diet. ?Quitting smoking. ?Using a device to open your airway while you sleep, such as: ?An oral appliance. This is a custom-made mouthpiece that shifts your lower jaw forward. ?A continuous positive airway pressure (CPAP) device. This device blows air through a mask when you breathe out (exhale). ?A nasal expiratory positive airway pressure (EPAP) device. This device has valves that you put into each nostril. ?A bi-level positive airway pressure (BIPAP) device. This device blows air through a mask when you breathe in (inhale) and breathe out (exhale). ?Having surgery if other treatments do not work. During surgery, excess tissue is removed to create a wider airway. ?Follow these instructions at home: ?Lifestyle ?Make any lifestyle  changes that your health care provider recommends. ?Eat a healthy, well-balanced diet. ?Take steps to lose weight if you are overweight. ?Avoid using depressants,  including alcohol, sedatives, and narcotics. ?Do not use any products that contain nicotine or tobacco. These products include cigarettes, chewing tobacco, and vaping devices, such as e-cigarettes. If you need help quitting, ask your health care provider. ?General instructions ?Take over-the-counter and prescription medicines only as told by your health care provider. ?If you were given a device to open your airway while you sleep, use it only as told by your health care provider. ?If you are having surgery, make sure to tell your health care provider you have sleep apnea. You may need to bring your device with you. ?Keep all follow-up visits. This is important. ?Contact a health care provider if: ?The device that you received to open your airway during sleep is uncomfortable or does not seem to be working. ?Your symptoms do not improve. ?Your symptoms get worse. ?Get help right away if: ?You develop: ?Chest pain. ?Shortness of breath. ?Discomfort in your back, arms, or stomach. ?You have: ?Trouble speaking. ?Weakness on one side of your body. ?Drooping in your face. ?These symptoms may represent a serious problem that is an emergency. Do not wait to see if the symptoms will go away. Get medical help right away. Call your local emergency services (911 in the [place] Do not drive yourself to the hospital. ?Summary ?Sleep apnea is a condition in which breathing pauses or becomes shallow during sleep. ?The most common cause is a collapsed or blocked airway. ?The goal of treatment is to restore normal breathing and to ease symptoms during sleep. ?This information is not intended to replace advice given to you by your health care provider. Make sure you discuss any questions you have with your health care provider. ?Document Revised: 04/09/2021 Document Reviewed: 08/09/2020 ?Elsevier Patient Education ? [place]. ? ?

## 2022-01-07 DIAGNOSIS — I252 Old myocardial infarction: Secondary | ICD-10-CM | POA: Diagnosis not present

## 2022-01-07 DIAGNOSIS — I214 Non-ST elevation (NSTEMI) myocardial infarction: Secondary | ICD-10-CM

## 2022-01-07 DIAGNOSIS — Z955 Presence of coronary angioplasty implant and graft: Secondary | ICD-10-CM

## 2022-01-07 NOTE — Progress Notes (Signed)
Daily Session Note ? ?Patient Details  ?Name: Grant Young ?MRN: [id number] ?Date of Birth: 1945/12/18 ?Referring Provider:   ?Flowsheet Row Cardiac Rehab from 12/18/2021 in [facility] Cardiac and Pulmonary Rehab  ?Referring Provider Ellyn Hack  ? ?  ? ? ?Encounter Date: 01/07/2022 ? ?Check In: ? Session Check In - 01/07/22 1029   ? ?  ? Check-In  ? Supervising physician immediately available to respond to emergencies See telemetry face sheet for immediately available ER MD   ? Location [facility]-Cardiac & Pulmonary Rehab   ? Staff Present Birdie Sons, MPA, RN;Joseph [place], RCP,RRT,BSRT;Melissa Bassett, RDN, LDN   ? Virtual Visit No   ? Medication changes reported     No   ? Fall or balance concerns reported    No   ? Warm-up and Cool-down Performed on first and last piece of equipment   ? Resistance Training Performed Yes   ? VAD Patient? No   ? PAD/SET Patient? No   ?  ? Pain Assessment  ? Currently in Pain? No/denies   ? ?  ?  ? ?  ? ? ? ? ? ?Social History  ? ?Tobacco Use  ?Smoking Status Never  ?Smokeless Tobacco Never  ? ? ?Goals Met:  ?Independence with exercise equipment ?Exercise tolerated well ?No report of concerns or symptoms today ?Strength training completed today ? ?Goals Unmet:  ?Not Applicable ? ?Comments: Pt able to follow exercise prescription today without complaint.  Will continue to monitor for progression. ? ? ? ?Dr. Emily Filbert is Medical Director for [place].  ?Dr. Ottie Glazier is Medical Director for [facility] Pulmonary Rehabilitation. ?

## 2022-01-09 ENCOUNTER — Other Ambulatory Visit: Payer: Self-pay | Admitting: Internal Medicine

## 2022-01-09 ENCOUNTER — Encounter: Payer: Medicare Other | Admitting: *Deleted

## 2022-01-09 DIAGNOSIS — Z955 Presence of coronary angioplasty implant and graft: Secondary | ICD-10-CM

## 2022-01-09 DIAGNOSIS — M545 Low back pain, unspecified: Secondary | ICD-10-CM

## 2022-01-09 DIAGNOSIS — I214 Non-ST elevation (NSTEMI) myocardial infarction: Secondary | ICD-10-CM

## 2022-01-09 DIAGNOSIS — I252 Old myocardial infarction: Secondary | ICD-10-CM | POA: Diagnosis not present

## 2022-01-09 NOTE — Progress Notes (Signed)
Daily Session Note ? ?Patient Details  ?Name: Grant Young ?MRN: [id number] ?Date of Birth: [birth year] ?Referring Provider:   ?Flowsheet Row Cardiac Rehab from 12/18/2021 in [facility] Cardiac and Pulmonary Rehab  ?Referring Provider Ellyn Hack  ? ?  ? ? ?Encounter Date: 01/09/2022 ? ?Check In: ? Session Check In - 01/09/22 1119   ? ?  ? Check-In  ? Supervising physician immediately available to respond to emergencies See telemetry face sheet for immediately available ER MD   ? Location [facility] Re[facility]   ? Staff Present Heath Lark, RN, BSN, CCRP;Jessica Dundee, MA, RCEP, CCRP, CCET;Joseph [place], [place]   ? Virtual Visit No   ? Medication changes reported     No   ? Fall or balance concerns reported    No   ? Warm-up and Cool-down Performed on first and last piece of equipment   ? Resistance Training Performed Yes   ? VAD Patient? No   ? PAD/SET Patient? No   ?  ? Pain Assessment  ? Currently in Pain? No/denies   ? ?  ?  ? ?  ? ? ? ? ? ?Social History  ? ?Tobacco Use  ?Smoking Status Never  ?Smokeless Tobacco Never  ? ? ?Goals Met:  ?Independence with exercise equipment ?Exercise tolerated well ?No report of concerns or symptoms today ? ?Goals Unmet:  ?Not Applicable ? ?Comments: Pt able to follow exercise prescription today without complaint.  Will continue to monitor for progression. ? ? ? ?Dr. Emily Filbert is Medical Director for [place].  ?Dr. Ottie Glazier is Medical Director for [facility] Pulmonary Rehabilitation. ?

## 2022-01-09 NOTE — Telephone Encounter (Signed)
Requested medication (s) are due for refill today: No ? ?Requested medication (s) are on the active medication list: No ? ?Last refill:   ? ?Future visit scheduled: No ? ?Notes to clinic:  D/C 11/27/21. ? ? ? ?Requested Prescriptions  ?Pending Prescriptions Disp Refills  ? tizanidine (ZANAFLEX) 2 MG capsule [Pharmacy Med Name: TIZANIDINE HCL 2 MG CAPSULE] 180 capsule 1  ?  Sig: Take 1 capsule (2 mg total) by mouth every 12 (twelve) hours as needed.  ?  ? Not Delegated - Cardiovascular:  Alpha-2 Agonists - tizanidine Failed - 01/09/2022  2:40 AM  ?  ?  Failed - This refill cannot be delegated  ?  ?  Passed - Valid encounter within last 6 months  ?  Recent Outpatient Visits   ? ?      ? 3 weeks ago Non-ST elevation (NSTEMI) myocardial infarction [facility])  ? [place], NP  ? 1 month ago Excessive daytime sleepiness  ? [facility] [place], [place], NP  ? 4 months ago Anxiety and depression  ? [facility] [place], [place] W, NP  ? 6 months ago Dizziness  ? [facility] [place], [place], NP  ? 9 months ago HTN (hypertension), benign  ? [facility] [place], [name], NP  ? ?  ?  ?Future Appointments   ? ?        ? In 3 weeks Dunn, Areta Haber, PA-C [facility]  ? In 4 months [name], [name], NP [facility], [place]  ? ?  ? ? ?  ?  ?  ? ?

## 2022-01-12 ENCOUNTER — Encounter: Payer: Medicare Other | Attending: Cardiology | Admitting: *Deleted

## 2022-01-12 ENCOUNTER — Other Ambulatory Visit: Payer: Self-pay | Admitting: Internal Medicine

## 2022-01-12 DIAGNOSIS — Z955 Presence of coronary angioplasty implant and graft: Secondary | ICD-10-CM | POA: Insufficient documentation

## 2022-01-12 DIAGNOSIS — I252 Old myocardial infarction: Secondary | ICD-10-CM | POA: Diagnosis not present

## 2022-01-12 DIAGNOSIS — I214 Non-ST elevation (NSTEMI) myocardial infarction: Secondary | ICD-10-CM

## 2022-01-12 DIAGNOSIS — Z5189 Encounter for other specified aftercare: Secondary | ICD-10-CM | POA: Diagnosis not present

## 2022-01-12 NOTE — Progress Notes (Signed)
Daily Session Note ? ?Patient Details  ?Name: Grant Young ?MRN: [id number] ?Date of Birth: 09-09-1946 ?Referring Provider:   ?Flowsheet Row Cardiac Rehab from 12/18/2021 in [facility] Cardiac and Pulmonary Rehab  ?Referring Provider Ellyn Hack  ? ?  ? ? ?Encounter Date: 01/12/2022 ? ?Check In: ? Session Check In - 01/12/22 1111   ? ?  ? Check-In  ? Supervising physician immediately available to respond to emergencies See telemetry face sheet for immediately available ER MD   ? Location [facility]   ? Staff Present Heath Lark, RN, BSN, [employer], MPA, RN;Kelly Amedeo Plenty, BS, ACSM CEP, Exercise Physiologist   ? Virtual Visit No   ? Medication changes reported     No   ? Fall or balance concerns reported    No   ? Warm-up and Cool-down Performed on first and last piece of equipment   ? Resistance Training Performed Yes   ? VAD Patient? No   ? PAD/SET Patient? No   ?  ? Pain Assessment  ? Currently in Pain? No/denies   ? ?  ?  ? ?  ? ? ? ? ? ?Social History  ? ?Tobacco Use  ?Smoking Status Never  ?Smokeless Tobacco Never  ? ? ?Goals Met:  ?Independence with exercise equipment ?Exercise tolerated well ?No report of concerns or symptoms today ? ?Goals Unmet:  ?Not Applicable ? ?Comments: Pt able to follow exercise prescription today without complaint.  Will continue to monitor for progression. ? ? ? ?Dr. Emily Filbert is Medical Director for [place].  ?Dr. Ottie Glazier is Medical Director for [facility] Pulmonary Rehabilitation. ?

## 2022-01-12 NOTE — Progress Notes (Signed)
Completed initial RD consultation ?

## 2022-01-13 NOTE — Telephone Encounter (Signed)
Requested medication (s) are due for refill today: yes ? ?Requested medication (s) are on the active medication list: yes ? ?Last refill:  10/15/21 ? ?Future visit scheduled: no ? ?Notes to clinic:  Unable to refill per protocol, cannot delegate. Medication is not attached to protocol, review manually. ? ? ? ?  ?Requested Prescriptions  ?Pending Prescriptions Disp Refills  ? pyridostigmine (MESTINON) 60 MG tablet [Pharmacy Med Name: PYRIDOSTIGMINE BR 60 MG TABLET] 180 tablet 0  ?  Sig: TAKE 1 TABLET BY MOUTH 2 TIMES DAILY.  ?  ? Off-Protocol Failed - 01/12/2022  2:39 AM  ?  ?  Failed - Medication not assigned to a protocol, review manually.  ?  ?  Passed - Valid encounter within last 12 months  ?  Recent Outpatient Visits   ? ?      ? 4 weeks ago Non-ST elevation (NSTEMI) myocardial infarction [facility])  ? [facility] [place], [place], NP  ? 2 months ago Excessive daytime sleepiness  ? [facility] [place], [place] W, NP  ? 4 months ago Anxiety and depression  ? [facility] [place], [place] W, NP  ? 6 months ago Dizziness  ? [facility] [place], [place] W, NP  ? 9 months ago HTN (hypertension), benign  ? [facility] [place], [name], NP  ? ?  ?  ?Future Appointments   ? ?        ? In 2 weeks Dunn, Areta Haber, PA-C [facility], [facility]  ? In 4 months [name], [name], NP [facility], [place]  ? ?  ? ? ?  ?  ?  ? ? ?

## 2022-01-14 DIAGNOSIS — Z5189 Encounter for other specified aftercare: Secondary | ICD-10-CM | POA: Diagnosis not present

## 2022-01-14 DIAGNOSIS — I214 Non-ST elevation (NSTEMI) myocardial infarction: Secondary | ICD-10-CM

## 2022-01-14 DIAGNOSIS — Z955 Presence of coronary angioplasty implant and graft: Secondary | ICD-10-CM

## 2022-01-14 NOTE — Progress Notes (Signed)
Daily Session Note ? ?Patient Details  ?Name: Grant Young ?MRN: [id number] ?Date of Birth: January 07, 1946 ?Referring Provider:   ?Flowsheet Row Cardiac Rehab from 12/18/2021 in [facility] [place] Cardiac and Pulmonary Rehab  ?Referring Provider Ellyn Hack  ? ?  ? ? ?Encounter Date: 01/14/2022 ? ?Check In: ? Session Check In - 01/14/22 1102   ? ?  ? Check-In  ? Supervising physician immediately available to respond to emergencies See telemetry face sheet for immediately available ER MD   ? Location [facility] Re[facility]   ? Staff Present Birdie Sons, MPA, RN;Joseph [place], RCP,RRT,BSRT;Melissa [place], RDN, LDN   ? Virtual Visit No   ? Medication changes reported     No   ? Fall or balance concerns reported    No   ? Warm-up and Cool-down Performed on first and last piece of equipment   ? Resistance Training Performed Yes   ? VAD Patient? No   ? PAD/SET Patient? No   ?  ? Pain Assessment  ? Currently in Pain? No/denies   ? ?  ?  ? ?  ? ? ? ? ? ?Social History  ? ?Tobacco Use  ?Smoking Status Never  ?Smokeless Tobacco Never  ? ? ?Goals Met:  ?Independence with exercise equipment ?Exercise tolerated well ?No report of concerns or symptoms today ?Strength training completed today ? ?Goals Unmet:  ?Not Applicable ? ?Comments: Pt able to follow exercise prescription today without complaint.  Will continue to monitor for progression. ? ? ? ?Dr. Emily Filbert is Medical Director for [place].  ?Dr. Ottie Glazier is Medical Director for [facility] Pulmonary Rehabilitation. ?

## 2022-01-16 ENCOUNTER — Encounter: Payer: Medicare Other | Admitting: *Deleted

## 2022-01-16 DIAGNOSIS — Z5189 Encounter for other specified aftercare: Secondary | ICD-10-CM | POA: Diagnosis not present

## 2022-01-16 DIAGNOSIS — I214 Non-ST elevation (NSTEMI) myocardial infarction: Secondary | ICD-10-CM

## 2022-01-16 DIAGNOSIS — Z955 Presence of coronary angioplasty implant and graft: Secondary | ICD-10-CM

## 2022-01-16 NOTE — Progress Notes (Signed)
Daily Session Note ? ?Patient Details  ?Name: Grant Young ?MRN: [id number] ?Date of Birth: 1946/04/23 ?Referring Provider:   ?Flowsheet Row Cardiac Rehab from 12/18/2021 in [facility] Cardiac and Pulmonary Rehab  ?Referring Provider Ellyn Hack  ? ?  ? ? ?Encounter Date: 01/16/2022 ? ?Check In: ? Session Check In - 01/16/22 1132   ? ?  ? Check-In  ? Supervising physician immediately available to respond to emergencies See telemetry face sheet for immediately available ER MD   ? Location [facility] Rehab   ? Staff Present Heath Lark, RN, BSN, CCRP;Joseph [place], [place], [place], [place], [place], CCET   ? Virtual Visit No   ? Medication changes reported     No   ? Fall or balance concerns reported    No   ? Warm-up and Cool-down Performed on first and last piece of equipment   ? Resistance Training Performed Yes   ? VAD Patient? No   ? PAD/SET Patient? No   ?  ? Pain Assessment  ? Currently in Pain? No/denies   ? ?  ?  ? ?  ? ? ? ? ? ?Social History  ? ?Tobacco Use  ?Smoking Status Never  ?Smokeless Tobacco Never  ? ? ?Goals Met:  ?Independence with exercise equipment ?Exercise tolerated well ?No report of concerns or symptoms today ? ?Goals Unmet:  ?Not Applicable ? ?Comments: Pt able to follow exercise prescription today without complaint.  Will continue to monitor for progression. ? ? ? ?Dr. Emily Filbert is Medical Director for [place].  ?Dr. Ottie Glazier is Medical Director for [facility] Pulmonary Rehabilitation. ?

## 2022-01-21 DIAGNOSIS — Z5189 Encounter for other specified aftercare: Secondary | ICD-10-CM | POA: Diagnosis not present

## 2022-01-21 DIAGNOSIS — I214 Non-ST elevation (NSTEMI) myocardial infarction: Secondary | ICD-10-CM

## 2022-01-21 DIAGNOSIS — Z955 Presence of coronary angioplasty implant and graft: Secondary | ICD-10-CM

## 2022-01-21 NOTE — Progress Notes (Signed)
Daily Session Note ? ?Patient Details  ?Name: Grant Young ?MRN: [id number] ?Date of Birth: [birth year] ?Referring Provider:   ?Flowsheet Row Cardiac Rehab from 12/18/2021 in [facility] Cardiac and Pulmonary Rehab  ?Referring Provider Ellyn Hack  ? ?  ? ? ?Encounter Date: 01/21/2022 ? ?Check In: ? Session Check In - 01/21/22 1034   ? ?  ? Check-In  ? Supervising physician immediately available to respond to emergencies See telemetry face sheet for immediately available ER MD   ? Location [facility] Re[facility]   ? Staff Present Birdie Sons, MPA, RN;Melissa [place], RDN, LDN;Joseph [place], RCP,RRT,BSRT   ? Virtual Visit No   ? Medication changes reported     No   ? Fall or balance concerns reported    No   ? Warm-up and Cool-down Performed on first and last piece of equipment   ? Resistance Training Performed Yes   ? VAD Patient? No   ? PAD/SET Patient? No   ?  ? Pain Assessment  ? Currently in Pain? No/denies   ? ?  ?  ? ?  ? ? ? ? ? ?Social History  ? ?Tobacco Use  ?Smoking Status Never  ?Smokeless Tobacco Never  ? ? ?Goals Met:  ?Independence with exercise equipment ?Exercise tolerated well ?No report of concerns or symptoms today ?Strength training completed today ? ?Goals Unmet:  ?Not Applicable ? ?Comments: Pt able to follow exercise prescription today without complaint.  Will continue to monitor for progression. ? ? ? ?Dr. Emily Filbert is Medical Director for [place].  ?Dr. Ottie Glazier is Medical Director for [facility] Pulmonary Rehabilitation. ?

## 2022-01-23 ENCOUNTER — Encounter: Payer: Medicare Other | Admitting: *Deleted

## 2022-01-23 DIAGNOSIS — Z5189 Encounter for other specified aftercare: Secondary | ICD-10-CM | POA: Diagnosis not present

## 2022-01-23 DIAGNOSIS — Z955 Presence of coronary angioplasty implant and graft: Secondary | ICD-10-CM

## 2022-01-23 DIAGNOSIS — I214 Non-ST elevation (NSTEMI) myocardial infarction: Secondary | ICD-10-CM

## 2022-01-23 NOTE — Progress Notes (Signed)
Daily Session Note ? ?Patient Details  ?Name: Grant Young ?MRN: [id number] ?Date of Birth: June 04, 1946 ?Referring Provider:   ?Flowsheet Row Cardiac Rehab from 12/18/2021 in [facility] Cardiac and Pulmonary Rehab  ?Referring Provider Ellyn Hack  ? ?  ? ? ?Encounter Date: 01/23/2022 ? ?Check In: ? Session Check In - 01/23/22 1141   ? ?  ? Check-In  ? Supervising physician immediately available to respond to emergencies See telemetry face sheet for immediately available ER MD   ? Location [facility] Re[facility]   ? Staff Present Heath Lark, RN, BSN, CCRP;Laureen Owens [place], BS, RRT, CPFT;Joseph [place], [place]   ? Virtual Visit No   ? Medication changes reported     No   ? Fall or balance concerns reported    No   ? Warm-up and Cool-down Performed on first and last piece of equipment   ? Resistance Training Performed Yes   ? VAD Patient? No   ? PAD/SET Patient? No   ?  ? Pain Assessment  ? Currently in Pain? No/denies   ? ?  ?  ? ?  ? ? ? ? ? ?Social History  ? ?Tobacco Use  ?Smoking Status Never  ?Smokeless Tobacco Never  ? ? ?Goals Met:  ?Independence with exercise equipment ?Exercise tolerated well ?No report of concerns or symptoms today ? ?Goals Unmet:  ?Not Applicable ? ?Comments: Pt able to follow exercise prescription today without complaint.  Will continue to monitor for progression. ? ? ? ?Dr. Emily Filbert is Medical Director for [place].  ?Dr. Ottie Glazier is Medical Director for [facility] Pulmonary Rehabilitation. ?

## 2022-01-26 ENCOUNTER — Encounter: Payer: Medicare Other | Admitting: *Deleted

## 2022-01-26 DIAGNOSIS — Z955 Presence of coronary angioplasty implant and graft: Secondary | ICD-10-CM

## 2022-01-26 DIAGNOSIS — I214 Non-ST elevation (NSTEMI) myocardial infarction: Secondary | ICD-10-CM

## 2022-01-26 DIAGNOSIS — Z5189 Encounter for other specified aftercare: Secondary | ICD-10-CM | POA: Diagnosis not present

## 2022-01-26 NOTE — Progress Notes (Signed)
Daily Session Note ? ?Patient Details  ?Name: Grant Young ?MRN: [id number] ?Date of Birth: 04/12/1946 ?Referring Provider:   ?Flowsheet Row Cardiac Rehab from 12/18/2021 in [facility] Cardiac and Pulmonary Rehab  ?Referring Provider [place]  ? ?  ? ? ?Encounter Date: 01/26/2022 ? ?Check In: ? Session Check In - 01/26/22 1141   ? ?  ? Check-In  ? Supervising physician immediately available to respond to emergencies See telemetry face sheet for immediately available ER MD   ? Location [facility] Rehab   ? Staff Present Susanne Bice, RN, BSN, CCRP;Kelly Bollinger, MPA, RN;Kelly Hayes, BS, ACSM CEP, Exercise Physiologist;Kara Langdon, MS, ASCM CEP, Exercise Physiologist;Jessica Hawkins, MA, RCEP, CCRP, CCET   ? Virtual Visit No   ? Medication changes reported     No   ? Fall or balance concerns reported    No   ? Warm-up and Cool-down Performed on first and last piece of equipment   ? Resistance Training Performed Yes   ? VAD Patient? No   ? PAD/SET Patient? No   ?  ? Pain Assessment  ? Currently in Pain? No/denies   ? ?  ?  ? ?  ? ? ? ? ? ?Social History  ? ?Tobacco Use  ?Smoking Status Never  ?Smokeless Tobacco Never  ? ? ?Goals Met:  ?Independence with exercise equipment ?Exercise tolerated well ?No report of concerns or symptoms today ?Strength training completed today ? ?Goals Unmet:  ?Not Applicable ? ?Comments: Pt able to follow exercise prescription today without complaint.  Will continue to monitor for progression. ? ? ? ?Dr. Mark Miller is Medical Director for [facility] Cardiac Rehabilitation.  ?Dr. Fuad Aleskerov is Medical Director for [facility] Pulmonary Rehabilitation. ?

## 2022-01-28 ENCOUNTER — Encounter: Payer: Self-pay | Admitting: *Deleted

## 2022-01-28 DIAGNOSIS — I214 Non-ST elevation (NSTEMI) myocardial infarction: Secondary | ICD-10-CM

## 2022-01-28 DIAGNOSIS — Z955 Presence of coronary angioplasty implant and graft: Secondary | ICD-10-CM

## 2022-01-28 DIAGNOSIS — Z5189 Encounter for other specified aftercare: Secondary | ICD-10-CM | POA: Diagnosis not present

## 2022-01-28 NOTE — Progress Notes (Signed)
Daily Session Note ? ?Patient Details  ?Name: Grant Young ?MRN: [id number] ?Date of Birth: 30-Jun-1946 ?Referring Provider:   ?Flowsheet Row Cardiac Rehab from 12/18/2021 in [facility] Cardiac and Pulmonary Rehab  ?Referring Provider Ellyn Hack  ? ?  ? ? ?Encounter Date: 01/28/2022 ? ?Check In: ? Session Check In - 01/28/22 1030   ? ?  ? Check-In  ? Supervising physician immediately available to respond to emergencies See telemetry face sheet for immediately available ER MD   ? Location [facility] Pulmonary Rehab   ? Staff Present Birdie Sons, MPA, RN;Melissa [place], RDN, LDN;Joseph [place], RCP,RRT,BSRT   ? Virtual Visit No   ? Medication changes reported     No   ? Fall or balance concerns reported    No   ? Warm-up and Cool-down Performed on first and last piece of equipment   ? Resistance Training Performed Yes   ? VAD Patient? No   ? PAD/SET Patient? No   ?  ? Pain Assessment  ? Currently in Pain? No/denies   ? ?  ?  ? ?  ? ? ? ? ? ?Social History  ? ?Tobacco Use  ?Smoking Status Never  ?Smokeless Tobacco Never  ? ? ?Goals Met:  ?Independence with exercise equipment ?Exercise tolerated well ?No report of concerns or symptoms today ?Strength training completed today ? ?Goals Unmet:  ?Not Applicable ? ?Comments: Pt able to follow exercise prescription today without complaint.  Will continue to monitor for progression. ? ? ? ?Dr. Emily Filbert is Medical Director for [place].  ?Dr. Ottie Glazier is Medical Director for [facility] Pulmonary Rehabilitation. ?

## 2022-01-28 NOTE — Progress Notes (Signed)
Office Visit    Patient Name: Grant Young Date of Encounter: 01/28/2022  Primary Care Provider:  [name], NP Primary Cardiologist:  Kate Sable, MD Primary Electrophysiologist: None  Chief Complaint    Grant Young is a 76 y.o. male with PMH of CAD with MI x2 in 98 and 1999 with PTCA only in [place] with NSTEMI in 11/2021 status post PCI/DES to the LAD and LCx, HFpEF, HTN, HLD, PVCs/NSVT, nephrolithiasis, headaches, GERD, and multiple medication intolerances presents today for 1 month follow-up.  Past Medical History    Past Medical History:  Diagnosis Date   Anxiety    Per New Patient Packet   Chronic back pain    Per New Patient Packet   Chronic heart disease    Per New Patient Packet   Depression    GERD (gastroesophageal reflux disease)    Headache    History of CT scan of brain 09/14/2018   Per New Patient Packet   History of depression    Per New Patient Packet   History of gastritis    Per New Patient Packet   History of headache    Per New Patient Packet   History of kidney stones    Per New Patient Packet   History of neuropathy    Per New Patient Packet   Hypertension    Per New Patient Packet   Insomnia    Kidney stone    Lumbar radicular pain    Per New Patient Packet   Lyme disease    Per New Patient Packet   Myocardial infarction [facility])    Overactive bladder    PONV (postoperative nausea and vomiting)    Skin cancer, basal cell    Sleep trouble    Per New Patient Packet   Spinal cord stimulator status    01/08/21 - not currently using.   Squamous cell skin cancer    Substance abuse [facility])    Past Surgical History:  Procedure Laterality Date   BACK SURGERY     CARDIAC CATHETERIZATION     CATARACT EXTRACTION W/PHACO Left 01/14/2021   Procedure: CATARACT EXTRACTION PHACO AND INTRAOCULAR LENS PLACEMENT ([facility]) LEFT VIVITY TORIC LENS 8.75 00:56.7;  Surgeon: Birder Robson, MD;  Location: [place];  Service:  Ophthalmology;  Laterality: Left;   CATARACT EXTRACTION W/PHACO Right 01/28/2021   Procedure: CATARACT EXTRACTION PHACO AND INTRAOCULAR LENS PLACEMENT ([place]) RIGHT VIVITY TORIC LENS;  Surgeon: Birder Robson, MD;  Location: [place];  Service: Ophthalmology;  Laterality: Right;  6.54 00:46.4   CHOLECYSTECTOMY  2017   COLONOSCOPY  09/15/2015   Per New Patient Packet   COLONOSCOPY WITH PROPOFOL N/A 10/03/2020   Procedure: COLONOSCOPY WITH PROPOFOL;  Surgeon: [name], MD;  Location: [facility] ENDOSCOPY;  Service: Gastroenterology;  Laterality: N/A;   CORONARY/GRAFT ACUTE MI REVASCULARIZATION N/A 11/27/2021   Procedure: Coronary/Graft Acute MI Revascularization;  Surgeon: Leonie Man, MD;  Location: [place] CV [facility];  Service: Cardiovascular;  Laterality: N/A;   ESOPHAGOGASTRODUODENOSCOPY (EGD) WITH PROPOFOL N/A 10/03/2020   Procedure: ESOPHAGOGASTRODUODENOSCOPY (EGD) WITH PROPOFOL;  Surgeon: [name], MD;  Location: [facility] ENDOSCOPY;  Service: Gastroenterology;  Laterality: N/A;   FOOT SURGERY Bilateral 03/18/2020   FOOT SURGERY  08/032021   GALLBLADDER SURGERY  09/15/2015   Gallbladder Removal. Procedure done by Dr.Beverly. Per New Patient Packet   KIDNEY STONE SURGERY  09/14/1980   Too many to count. Per New Patient Packet 09/14/1980-09/15/1995   KIDNEY STONE SURGERY  09/15/1995   Too many to count. Per New Patient Packet   LEFT HEART CATH AND CORONARY ANGIOGRAPHY N/A 11/27/2021   Procedure: LEFT HEART CATH AND CORONARY ANGIOGRAPHY;  Surgeon: Leonie Man, MD;  Location: [place] CV LAB;  Service: Cardiovascular;  Laterality: N/A;   LITHOTRIPSY  09/14/2014   Per New Patient Packet   LITHOTRIPSY  09/14/1996   No Stints Used. Per New Patient Packet   LITHOTRIPSY  09/14/1997   No Stints used. Per New Patient Packet   PAIN PUMP IMPLANTATION     PAIN PUMP REMOVAL     SHOULDER SURGERY Right 1984   SIGMOIDOSCOPY  09/14/2017   Per New Patient Packet   SPINAL CORD  STIMULATOR IMPLANT     TONSILLECTOMY     UPPER GI ENDOSCOPY      Allergies  Allergies  Allergen Reactions   Ace Inhibitors     Other reaction(s): Cough   Fluoxetine Anxiety    "made me fall asleep" per pt "bad headaches and "makes  Me  Crazy" historical allergy noted in [employer] "made me fall asleep" per pt "bad headaches and "makes  Me  Crazy" Per New Patient Packet.     Metoclopramide     Other reaction(s): Other (See Comments), Other (See Comments), Unknown Tardive Dyskinesia  historical allergy noted in [employer] Tardive Dyskinesia  Per New Patient Packet.   Nalbuphine     Used Post Back surgery- Anesthesiologist Error. Patient had Narcotic Withdraw. Per New Patient Packet.    Other     Other reaction(s): Other (See Comments) Altered mental status in combo with narcotics at previous hospitalization - Full Withdrawal Symptoms Other reaction(s): Rash   Amoxicillin-Pot Clavulanate Nausea Only    Per New Patient Packet.   Doxazosin Rash    Other reaction(s): Other - See Comments, Rash UNKNOWN REACTION UNKNOWN REACTION    Duloxetine Nausea Only    Per New Patient Packet.    Penicillins Nausea Only    Per New Patient Packet.   Tamsulosin Itching and Anxiety    Restless, Flushing, Heavy Chest, Itching, Hyperactive mood and Anxiety. Unable to handle side effects. Per New Patient Packet.     Trazodone And Nefazodone Itching, Anxiety and Rash    Headache. "INCREASED MY ANXIETY AND HEARTRATE" Flushing, tachycardia "INCREASED MY ANXIETY AND HEARTRATE" Per New Patient Packet.    Amlodipine     Shaking, unsure of reaction. Per New Patient Packet.     Cinoxacin     GI Intolerance, and Dizziness. Per New Patient Packet.    Ciprofloxacin     Other reaction(s): Unknown   Fludrocortisone Other (See Comments)    "Worsening headaches, GI issues, fatigue"   Nebivolol     Other reaction(s): Unknown   Olanzapine     Headache and unable to sleep for 3 nights. Per New Patient  Packet.    Olmesartan     Other reaction(s): Unknown   Pregabalin     Confusion, Lack of concentration, dizziness, and likely drowsiness. Per New Patient Packet.     Prostaglandins     Other reaction(s): Other (See Comments) Intolerance   Thyroid Hormones     Other reaction(s): Other (See Comments) Thyroid (Nature Thyroid) contraindicated with some of your other medications.   Zolpidem     Nightmares, Ineffective after 2 days. Per New Patient Packet.    Duloxetine Hcl     Other reaction(s): Rash   Fluoxetine Hcl     Other reaction(s): Rash   Nucynta [  Tapentadol] Other (See Comments)    Vertigo    Phenytoin Anxiety    Hyperactivity, and Ineffective. Per New Patient Packet.     History of Present Illness    Grant Young has a PMH of CAD with MI x2 in [place] with PTCA only in [place] with NSTEMI in 11/2021 status post PCI/DES to the LAD and LCx, HFpEF, HTN, HLD, PVCs/NSVT, nephrolithiasis, headaches, GERD, and multiple medication intolerances.  He was referred to Dr.Agbor-Etang on 11/2019 for uncontrolled hypertension.  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  He was started on chlorthalidone 25 mg and 2 weeks later was started on Benicar 20 mg.  Presented to [facility] on 3/23 with NSTEMI and underwent LHC with PCI to LAD and LCx.  Post PCI echo demonstrated EF of 50-55% with inferior and lateral wall hypokinesis and mild MV regurgitation.   He was seen in follow-up on 12/26/2021 for post hospital visit.  During visit patient endorsed significant amount of fatigue and stated that this was similar to when he was started on atorvastatin and statin therapy in the past.  He is agreeable to resume ezetimibe twice weekly.  He was tolerating DAPT with no bleeding issues and home BP was well controlled.  He was currently participating in cardiac rehab program.  Since last being seen in the office patient reports no  bleeding issue or anginal complaints since his last appointment.  He presents today with his wife. He reports that he has increased headaches with Brilinta.he is also endorsing sleeping difficulties since starting Brilinta and has discussed this with his PCP who has suggested a sleep study. He is compliant with Zetia and endorses no myalgias or intolerances since initiating therapy.  We discussed the possibility of starting PCSK9 inhibitors however he is still opposed to a lipid clinic referral at this time Patient denies chest pain, palpitations, dyspnea, PND, orthopnea, nausea, vomiting, dizziness, syncope, edema, weight gain, or early satiety.    Home Medications    Current Outpatient Medications  Medication Sig Dispense Refill   amitriptyline (ELAVIL) 10 MG tablet TAKE 3 TABLETS BY MOUTH AT BEDTIME 270 tablet 0   anastrozole (ARIMIDEX) 1 MG tablet 1/2 tablet (0.5 mg) by mouth once weekly     aspirin 81 MG chewable tablet Chew 1 tablet (81 mg total) by mouth daily. 90 tablet 3   butalbital-acetaminophen-caffeine (FIORICET) 50-325-40 MG tablet Take 1 tablet by mouth every 6 (six) hours as needed for headache.     cetirizine (ZYRTEC) 10 MG tablet Take 10 mg by mouth daily as needed for allergies.     chlorthalidone (HYGROTON) 25 MG tablet TAKE 1 TABLET (25 MG TOTAL) BY MOUTH DAILY. 90 tablet 0   Cholecalciferol 50 MCG (2000 UT) CAPS Take 1 tablet by mouth daily.     cholestyramine (QUESTRAN) 4 g packet Take 1 packet (4 g total) by mouth 3 (three) times daily. (Patient taking differently: Take 4 g by mouth daily as needed.) 270 packet 3   Cyanocobalamin (VITAMIN B-12) 5000 MCG LOZG Take 1 lozenge by mouth daily.      DHEA 25 MG CAPS Take 25 mg by mouth daily.     diazepam (VALIUM) 5 MG tablet 5 mg PO in am and 2 tabs PO QHS 90 tablet 0   dicyclomine (BENTYL) 20 MG tablet Take 20 mg by mouth as needed.     Digestive  Aids Mixture (DIGESTION GB PO) Take 1 capsule by mouth daily.     escitalopram  (LEXAPRO) 20 MG tablet TAKE 1 TABLET BY MOUTH EVERY DAY 90 tablet 1   ezetimibe (ZETIA) 10 MG tablet Take 1 tablet (10 mg total) by mouth daily. 90 tablet 1   HYDROcodone-acetaminophen (NORCO) 10-325 MG tablet Take 1 tablet by mouth 3 (three) times daily as needed.     Loperamide HCl (IMODIUM PO) Take by mouth as needed.      MAGNESIUM BISGLYCINATE PO Take by mouth.     melatonin 5 MG TABS Take 5 mg by mouth at bedtime.     NONFORMULARY OR COMPOUNDED ITEM Super Bi-Mix Injection  Papaverine HCI '30mg'$ /mL Phentolamine Mesylate '1mg'$ /mL Inject 0.76m - 167mIncrease as needed to achieve erection (Patient taking differently: Super Bi-Mix Injection '150mg'$ /'5mg'$  Papaverine HCI '30mg'$ /mL Phentolamine Mesylate '1mg'$ /mL Inject 0.74m62m 1mL79mcrease as needed to achieve erection) 5 each 6   Nutritional Supplements (NUTRITIONAL SUPPLEMENT PO) Take by mouth. [name]     Nutritional Supplements (NUTRITIONAL SUPPLEMENT PO) Take by mouth. [name]     ondansetron (ZOFRAN) 4 MG tablet Take 4 mg by mouth every 6 (six) hours as needed for nausea or vomiting.     polyethylene glycol (MIRALAX / GLYCOLAX) 17 g packet Take 17 g by mouth daily as needed.     Potassium 99 MG TABS Take by mouth 3 (three) times a week. K-Zyme     Probiotic Product (PROBIOTIC DAILY PO) Take by mouth.     prochlorperazine (COMPAZINE) 10 MG tablet Take 10 mg by mouth. TAKE 1 TABLET (10 MG TOTAL) BY MOUTH EVERY 6 (SIX) HOURS AS NEEDED for HEADACHE AND NAUSEA, LIMIT USE TO 3 DAYS PER WEEK     propranolol (INDERAL) 40 MG tablet Take 1 tablet (40 mg total) by mouth 2 (two) times daily. 180 tablet 0   pyridostigmine (MESTINON) 60 MG tablet TAKE 1 TABLET BY MOUTH 2 TIMES DAILY. 180 tablet 0   RABEprazole (ACIPHEX) 20 MG tablet TAKE 1 TABLET BY MOUTH TWICE A DAY 180 tablet 0   sucralfate (CARAFATE) 1 g tablet Take 1 g by mouth 4 (four) times daily as needed.      testosterone cypionate (DEPOTESTOSTERONE CYPIONATE) 200 MG/ML injection Inject  80 mg into the muscle every 14 (fourteen) days.     ticagrelor (BRILINTA) 90 MG TABS tablet Take 1 tablet (90 mg total) by mouth 2 (two) times daily. 180 tablet 3   valACYclovir (VALTREX) 1000 MG tablet Take 2 tabs p.o. and repeat in 12 hours as needed for cold sore 30 tablet 0   Vibegron (GEMTESA) 75 MG TABS Take 75 mg by mouth daily. 90 tablet 3   No current facility-administered medications for this visit.     Review of Systems  Please see the history of present illness.    (+) Headaches and fatigue  All other systems reviewed and are otherwise negative except as noted above.  Physical Exam    Wt Readings from Last 3 Encounters:  01/05/22 142 lb (64.4 kg)  12/26/21 145 lb (65.8 kg)  12/18/21 141 lb 9.6 oz (64.2 kg)   VS:TJK:DTOIZe no vitals filed for this visit.,There is no height or weight on file to calculate BMI.  Constitutional:      Appearance: Healthy appearance. Not in distress. Neck:     Vascular: JVD normal.  Pulmonary:     Effort: Pulmonary effort is normal.     Breath sounds:  No wheezing. No rales. Diminished in the bases Cardiovascular:     Normal rate. Regular rhythm. Normal S1. Normal S2.      Murmurs: There is no murmur.  Edema:    Peripheral edema absent.  Abdominal:     Palpations: Abdomen is soft non tender. There is no hepatomegaly.  Skin:    General: Skin is warm and dry.  Neurological:     General: No focal deficit present,    Mental Status: Alert and oriented to person, place and time.     Cranial Nerves: Cranial nerves are intact.  EKG/LABS/Other Studies Reviewed    ECG personally reviewed by me today -none completed today  Lab Results  Component Value Date   WBC 5.5 12/26/2021   HGB 14.4 12/26/2021   HCT 43.5 12/26/2021   MCV 90 12/26/2021   PLT 202 12/26/2021   Lab Results  Component Value Date   CREATININE 0.82 12/26/2021   BUN 14 12/26/2021   NA 146 (H) 12/26/2021   K 4.2 12/26/2021   CL 104 12/26/2021   CO2 27 12/26/2021    Lab Results  Component Value Date   ALT 24 05/20/2020   AST 18 05/20/2020   GGT 24 09/16/2020   ALKPHOS 149 (H) 09/16/2020   Lab Results  Component Value Date   CHOL 170 11/11/2021   HDL 48 11/11/2021   LDLCALC 103 (H) 11/11/2021   TRIG 99 11/11/2021   CHOLHDL 3.5 11/11/2021    Lab Results  Component Value Date   HGBA1C 5.1 10/17/2019    Assessment & Plan    1.  Coronary artery disease: -s/p PCI/DES to the LAD and LCx.  DAPT was initiated with ASA 81 mg and Brilinta 90 mg twice daily.  Today patient states that he is suffering from headaches that occurred since beginning [place].  I advised that we will reach out to Dr. Ellyn Hack for recommendation of changing Brilinta to Plavix if headaches continue following sleep study with PCP. -He denies any complaints of angina or shortness of breath today. -Discussed at length with him the importance of medication compliance especially with DAPT following PCI due to risk of in-stent occlusion if medications are discontinued. -Stable with no anginal symptoms today. No indication for ischemic evaluation.    2.  Hypertension: -Blood pressure today was 124/80 at goal of less than 130/80 -Continue current antihypertensive regimen -Continue low sodium heart healthy diet  3.  Hyperlipidemia: -Patient states that he is compliant with his Zetia and has not felt any difference in fatigue since stopping statin. -We discussed the alternative PCSK9 inhibitors and referral to lipid clinic for further discussion. Patient politely declines at this time and I informed him of the option if he changes his mind in the future. -Direct LDL and LFT today with plan to continue current therapy if LDL less than 70  4.  HFpEF: -2D echo with EF 50-55% on 11/2021 -Patient euvolemic on examination, -Low sodium diet, fluid restriction <2L, and daily weights encouraged. Educated to contact our office for weight gain of 2 lbs overnight or 5 lbs in one week.    Disposition: Follow-up with Kate Sable, MD in 6 months    Medication Adjustments/Labs and Tests Ordered: Current medicines are reviewed at length with the patient today.  Concerns regarding medicines are outlined above.   Signed, Mable Fill, Marissa Nestle, NP 01/28/2022, 4:54 PM [place]

## 2022-01-28 NOTE — Progress Notes (Signed)
Cardiac Individual Treatment Plan ? ?Patient Details  ?Name: Grant Young ?MRN: [id number] ?Date of Birth: 1945/10/27 ?Referring Provider:   ?Flowsheet Row Cardiac Rehab from 12/18/2021 in [facility] Cardiac and Pulmonary Rehab  ?Referring Provider Ellyn Hack  ? ?  ? ? ?Initial Encounter Date:  ?Flowsheet Row Cardiac Rehab from 12/18/2021 in [facility] Cardiac and Pulmonary Rehab  ?Date 12/18/21  ? ?  ? ? ?Visit Diagnosis: NSTEMI (non-ST elevated myocardial infarction) ([place]) ? ?Status post coronary artery stent placement ? ?Patient's Home Medications on Admission: ? ?Current Outpatient Medications:  ?  amitriptyline (ELAVIL) 10 MG tablet, TAKE 3 TABLETS BY MOUTH AT BEDTIME, Disp: 270 tablet, Rfl: 0 ?  anastrozole (ARIMIDEX) 1 MG tablet, 1/2 tablet (0.5 mg) by mouth once weekly, Disp: , Rfl:  ?  aspirin 81 MG chewable tablet, Chew 1 tablet (81 mg total) by mouth daily., Disp: 90 tablet, Rfl: 3 ?  butalbital-acetaminophen-caffeine (FIORICET) 50-325-40 MG tablet, Take 1 tablet by mouth every 6 (six) hours as needed for headache., Disp: , Rfl:  ?  cetirizine (ZYRTEC) 10 MG tablet, Take 10 mg by mouth daily as needed for allergies., Disp: , Rfl:  ?  chlorthalidone (HYGROTON) 25 MG tablet, TAKE 1 TABLET (25 MG TOTAL) BY MOUTH DAILY., Disp: 90 tablet, Rfl: 0 ?  Cholecalciferol 50 MCG (2000 UT) CAPS, Take 1 tablet by mouth daily., Disp: , Rfl:  ?  cholestyramine (QUESTRAN) 4 g packet, Take 1 packet (4 g total) by mouth 3 (three) times daily. (Patient taking differently: Take 4 g by mouth daily as needed.), Disp: 270 packet, Rfl: 3 ?  Cyanocobalamin (VITAMIN B-12) 5000 MCG LOZG, Take 1 lozenge by mouth daily. , Disp: , Rfl:  ?  DHEA 25 MG CAPS, Take 25 mg by mouth daily., Disp: , Rfl:  ?  diazepam (VALIUM) 5 MG tablet, 5 mg PO in am and 2 tabs PO QHS, Disp: 90 tablet, Rfl: 0 ?  dicyclomine (BENTYL) 20 MG tablet, Take 20 mg by mouth as needed., Disp: , Rfl:  ?  Digestive Aids Mixture (DIGESTION GB PO), Take 1 capsule by mouth daily., Disp: ,  Rfl:  ?  escitalopram (LEXAPRO) 20 MG tablet, TAKE 1 TABLET BY MOUTH EVERY DAY, Disp: 90 tablet, Rfl: 1 ?  ezetimibe (ZETIA) 10 MG tablet, Take 1 tablet (10 mg total) by mouth daily., Disp: 90 tablet, Rfl: 1 ?  HYDROcodone-acetaminophen (NORCO) 10-325 MG tablet, Take 1 tablet by mouth 3 (three) times daily as needed., Disp: , Rfl:  ?  Loperamide HCl (IMODIUM PO), Take by mouth as needed. , Disp: , Rfl:  ?  MAGNESIUM BISGLYCINATE PO, Take by mouth., Disp: , Rfl:  ?  melatonin 5 MG TABS, Take 5 mg by mouth at bedtime., Disp: , Rfl:  ?  NONFORMULARY OR COMPOUNDED ITEM, Super Bi-Mix Injection  Papaverine HCI '30mg'$ /mL Phentolamine Mesylate '1mg'$ /mL Inject 0.72m - 177mIncrease as needed to achieve erection (Patient taking differently: Super Bi-Mix Injection '150mg'$ /'5mg'$  Papaverine HCI '30mg'$ /mL Phentolamine Mesylate '1mg'$ /mL Inject 0.37m50m 1mL66mcrease as needed to achieve erection), Disp: 5 each, Rfl: 6 ?  Nutritional Supplements (NUTRITIONAL SUPPLEMENT PO), Take by mouth. [name], Disp: , Rfl:  ?  Nutritional Supplements (NUTRITIONAL SUPPLEMENT PO), Take by mouth. [name], Disp: , Rfl:  ?  ondansetron (ZOFRAN) 4 MG tablet, Take 4 mg by mouth every 6 (six) hours as needed for nausea or vomiting., Disp: , Rfl:  ?  polyethylene glycol (MIRALAX / GLYCOLAX) 17 g packet, Take 17 g  by mouth daily as needed., Disp: , Rfl:  ?  Potassium 99 MG TABS, Take by mouth 3 (three) times a week. K-Zyme, Disp: , Rfl:  ?  Probiotic Product (PROBIOTIC DAILY PO), Take by mouth., Disp: , Rfl:  ?  prochlorperazine (COMPAZINE) 10 MG tablet, Take 10 mg by mouth. TAKE 1 TABLET (10 MG TOTAL) BY MOUTH EVERY 6 (SIX) HOURS AS NEEDED for HEADACHE AND NAUSEA, LIMIT USE TO 3 DAYS PER WEEK, Disp: , Rfl:  ?  propranolol (INDERAL) 40 MG tablet, Take 1 tablet (40 mg total) by mouth 2 (two) times daily., Disp: 180 tablet, Rfl: 0 ?  pyridostigmine (MESTINON) 60 MG tablet, TAKE 1 TABLET BY MOUTH 2 TIMES DAILY., Disp: 180 tablet, Rfl: 0 ?  RABEprazole  (ACIPHEX) 20 MG tablet, TAKE 1 TABLET BY MOUTH TWICE A DAY, Disp: 180 tablet, Rfl: 0 ?  sucralfate (CARAFATE) 1 g tablet, Take 1 g by mouth 4 (four) times daily as needed. , Disp: , Rfl:  ?  testosterone cypionate (DEPOTESTOSTERONE CYPIONATE) 200 MG/ML injection, Inject 80 mg into the muscle every 14 (fourteen) days., Disp: , Rfl:  ?  ticagrelor (BRILINTA) 90 MG TABS tablet, Take 1 tablet (90 mg total) by mouth 2 (two) times daily., Disp: 180 tablet, Rfl: 3 ?  valACYclovir (VALTREX) 1000 MG tablet, Take 2 tabs p.o. and repeat in 12 hours as needed for cold sore, Disp: 30 tablet, Rfl: 0 ?  Vibegron (GEMTESA) 75 MG TABS, Take 75 mg by mouth daily., Disp: 90 tablet, Rfl: 3 ? ?Past Medical History: ?Past Medical History:  ?Diagnosis Date  ? Anxiety   ? Per New Patient Packet  ? Chronic back pain   ? Per New Patient Packet  ? Chronic heart disease   ? Per New Patient Packet  ? Depression   ? GERD (gastroesophageal reflux disease)   ? Headache   ? History of CT scan of brain 09/14/2018  ? Per New Patient Packet  ? History of depression   ? Per New Patient Packet  ? History of gastritis   ? Per New Patient Packet  ? History of headache   ? Per New Patient Packet  ? History of kidney stones   ? Per New Patient Packet  ? History of neuropathy   ? Per New Patient Packet  ? Hypertension   ? Per New Patient Packet  ? Insomnia   ? Kidney stone   ? Lumbar radicular pain   ? Per New Patient Packet  ? Lyme disease   ? Per New Patient Packet  ? Myocardial infarction [facility])   ? Overactive bladder   ? PONV (postoperative nausea and vomiting)   ? Skin cancer, basal cell   ? Sleep trouble   ? Per New Patient Packet  ? Spinal cord stimulator status   ? 01/08/21 - not currently using.  ? Squamous cell skin cancer   ? Substance abuse ([place])   ? ? ?Tobacco Use: ?Social History  ? ?Tobacco Use  ?Smoking Status Never  ?Smokeless Tobacco Never  ? ? ?Labs: ?Review Flowsheet   ? ?  ?  Latest Ref Rng & Units 08/24/2019 10/17/2019 05/16/2020 11/11/2021   ?Labs for ITP Cardiac and Pulmonary Rehab  ?Cholestrol <200 mg/dL 168       170   170    ?LDL (calc) mg/dL (calc) 96       100   103    ?HDL-C > OR = 40 mg/dL 48  56   48    ?Trlycerides <150 mg/dL 121       59   99    ?Hemoglobin A1c   5.1         ?  ? ? This result is from an external source.  ?  ?  ? ? ? ?Exercise Target Goals: ?Exercise Program Goal: ?Individual exercise prescription set using results from initial 6 min walk test and THRR while considering  patient?s activity barriers and safety.  ? ?Exercise Prescription Goal: ?Initial exercise prescription builds to 30-45 minutes a day of aerobic activity, 2-3 days per week.  Home exercise guidelines will be given to patient during program as part of exercise prescription that the participant will acknowledge. ? ? ?Education: Aerobic Exercise: ?- Group verbal and visual presentation on the components of exercise prescription. Introduces F.I.T.T principle from ACSM for exercise prescriptions.  Reviews F.I.T.T. principles of aerobic exercise including progression. Written material given at graduation. ?Flowsheet Row Cardiac Rehab from 01/21/2022 in [facility] Cardiac and Pulmonary Rehab  ?Date 01/14/22  ?Educator Jh  ?Instruction Review Code 1- Verbalizes Understanding  ? ?  ? ? ?Education: Resistance Exercise: ?- Group verbal and visual presentation on the components of exercise prescription. Introduces F.I.T.T principle from ACSM for exercise prescriptions  Reviews F.I.T.T. principles of resistance exercise including progression. Written material given at graduation. ?Flowsheet Row Cardiac Rehab from 01/21/2022 in [facility] Cardiac [facility] Pulmonary Rehab  ?Date 01/21/22  ?Educator [facility]  ?Instruction Review Code 1- Verbalizes Understanding  ? ?  ? ?  ?Education: Exercise & Equipment Safety: ?- Individual verbal instruction and demonstration of equipment use and safety with use of the equipment. ?Flowsheet Row Cardiac Rehab from 01/21/2022 in [facility] Cardiac and Pulmonary Rehab   ?Date 12/18/21  ?Educator AS  ?Instruction Review Code 1- Verbalizes Understanding  ? ?  ? ? ?Education: Exercise Physiology & General Exercise Guidelines: ?- Group verbal and written instruction with m

## 2022-01-30 ENCOUNTER — Ambulatory Visit (INDEPENDENT_AMBULATORY_CARE_PROVIDER_SITE_OTHER): Payer: Medicare Other | Admitting: Nurse Practitioner

## 2022-01-30 ENCOUNTER — Encounter: Payer: Self-pay | Admitting: Physician Assistant

## 2022-01-30 ENCOUNTER — Other Ambulatory Visit
Admission: RE | Admit: 2022-01-30 | Discharge: 2022-01-30 | Disposition: A | Discharge status: Home / Self Care | Payer: Medicare Other | Attending: Nurse Practitioner | Admitting: Nurse Practitioner

## 2022-01-30 VITALS — BP 124/80 | HR 55 | Ht 69.0 in | Wt 142.0 lb

## 2022-01-30 DIAGNOSIS — Z79899 Other long term (current) drug therapy: Secondary | ICD-10-CM

## 2022-01-30 DIAGNOSIS — I2510 Atherosclerotic heart disease of native coronary artery without angina pectoris: Secondary | ICD-10-CM

## 2022-01-30 DIAGNOSIS — I5030 Unspecified diastolic (congestive) heart failure: Secondary | ICD-10-CM

## 2022-01-30 DIAGNOSIS — I10 Essential (primary) hypertension: Secondary | ICD-10-CM

## 2022-01-30 DIAGNOSIS — E785 Hyperlipidemia, unspecified: Secondary | ICD-10-CM | POA: Insufficient documentation

## 2022-01-30 DIAGNOSIS — I249 Acute ischemic heart disease, unspecified: Secondary | ICD-10-CM

## 2022-01-30 LAB — HEPATIC FUNCTION PANEL
ALT: 16 U/L (ref 0–44)
AST: 15 U/L (ref 15–41)
Albumin: 3.8 g/dL (ref 3.5–5.0)
Alkaline Phosphatase: 81 U/L (ref 38–126)
Bilirubin, Direct: <0.1 mg/dL (ref 0.0–0.2)
Total Bilirubin: 0.5 mg/dL (ref 0.3–1.2)
Total Protein: 6.1 g/dL — ABNORMAL LOW (ref 6.5–8.1)

## 2022-01-30 LAB — LDL CHOLESTEROL, DIRECT: Direct LDL: 98.5 mg/dL (ref 0–99)

## 2022-01-30 NOTE — Patient Instructions (Addendum)
Medication Instructions:  - Your physician recommends that you continue on your current medications as directed. Please refer to the Current Medication list given to you today.  *If you need a refill on your cardiac medications before your next appointment, please call your pharmacy*   Lab Work: - Your physician recommends that you have lab work today:  Biomedical scientist LDL/ [place] Entrance at [facility] 1st desk on the right to check in (REGISTRATION) Lab hours: Monday- Friday (7:30 am- 5:30 pm)   If you have labs (blood work) drawn today and your tests are completely normal, you will receive your results only by: MyChart Message (if you have MyChart) OR A paper copy in the mail If you have any lab test that is abnormal or we need to change your treatment, we will call you to review the results.   Testing/Procedures: - none ordered   Follow-Up: At [facility], you and your health needs are our priority.  As part of our continuing mission to provide you with exceptional heart care, we have created designated Provider Care Teams.  These Care Teams include your primary Cardiologist (physician) and Advanced Practice Providers (APPs -  Physician Assistants and Nurse Practitioners) who all work together to provide you with the care you need, when you need it.  We recommend signing up for the patient portal called "MyChart".  Sign up information is provided on this After Visit Summary.  MyChart is used to connect with patients for Virtual Visits (Telemedicine).  Patients are able to view lab/test results, encounter notes, upcoming appointments, etc.  Non-urgent messages can be sent to your provider as well.   To learn more about what you can do with MyChart, go to [email or web address].    Your next appointment:   6 month(s)  The format for your next appointment:   In Person  Provider:   Glenetta Hew, MD   Other Instructions N/a  Important Information About  [place]

## 2022-02-02 ENCOUNTER — Other Ambulatory Visit: Payer: Self-pay | Admitting: Internal Medicine

## 2022-02-02 ENCOUNTER — Telehealth: Payer: Self-pay | Admitting: *Deleted

## 2022-02-02 NOTE — Telephone Encounter (Signed)
Called patient to review his results and recommendations. He verbalized understanding and would like to know if Dr. Ellyn Hack had determined if he could try different medication instead of Brilinta. He reports that his headaches are worse and the abdominal issues are continuing and would like to try something else. Advised that provider has not replied with any updates and that I will be happy to send it over for their review. He verbalized understanding with no further questions at this time.

## 2022-02-02 NOTE — Telephone Encounter (Signed)
-----   Message from Marylu Lund., NP sent at 02/01/2022 10:42 AM EDT ----- Good morning,  Please let Grant Young know that his liver function is normal. His LDL cholesterol is still above goal of less than 70.  Please continue current therapy with Zetia and lifestyle modifications such as regular exercise, reducing saturated fat intake, and sugars.  Please let him know that the option for lipid clinic referral is still available if he decides in the future.  Ambrose Pancoast, NP

## 2022-02-03 NOTE — Telephone Encounter (Signed)
Requested medications are due for refill today.  yes  Requested medications are on the active medications list.  yes  Last refill. 12/30/2021 #90 0 refills  Future visit scheduled.   yes  Notes to clinic.  Medication refill is not delegated.    Requested Prescriptions  Pending Prescriptions Disp Refills   diazepam (VALIUM) 5 MG tablet [Pharmacy Med Name: DIAZEPAM 5 MG TABLET] 90 tablet 0    Sig: TAKE 1 TABLET IN THE MORNING AND 2 TABLETS AT BEDTIME     Not Delegated - Psychiatry: Anxiolytics/Hypnotics 2 Failed - 02/02/2022 10:43 AM      Failed - This refill cannot be delegated      Failed - Urine Drug Screen completed in last 360 days      Passed - Patient is not pregnant      Passed - Valid encounter within last 6 months    Recent Outpatient Visits           1 month ago Non-ST elevation (NSTEMI) myocardial infarction [facility])   [facility] [place], [name], NP   2 months ago Excessive daytime sleepiness   [facility] [place], [name], NP   5 months ago Anxiety and depression   [place], NP   7 months ago [facility], [name], NP   10 months ago HTN (hypertension), benign   [facility] [place], [name], NP       Future Appointments             In 1 week Martyn Ehrich, NP [facility]   In 3 months [name], [name], NP [facility], [place]   In 6 months Ellyn Hack, Leonie Green, MD [facility]

## 2022-02-04 DIAGNOSIS — I214 Non-ST elevation (NSTEMI) myocardial infarction: Secondary | ICD-10-CM

## 2022-02-04 DIAGNOSIS — Z5189 Encounter for other specified aftercare: Secondary | ICD-10-CM | POA: Diagnosis not present

## 2022-02-04 DIAGNOSIS — Z955 Presence of coronary angioplasty implant and graft: Secondary | ICD-10-CM

## 2022-02-04 NOTE — Progress Notes (Signed)
Daily Session Note  Patient Details  Name: Grant Young MRN: [id number] Date of Birth: 02/25/1946 Referring Provider:   Flowsheet Row Cardiac Rehab from 12/18/2021 in [facility] Cardiac and Pulmonary Rehab  Referring Provider Ellyn Hack       Encounter Date: 02/04/2022  Check In:  Session Check In - 02/04/22 1033       Check-In   Supervising physician immediately available to respond to emergencies See telemetry face sheet for immediately available ER MD    Location [facility]-Cardiac & Pulmonary Rehab    Staff Present Birdie Sons, MPA, RN;Jessica Luan Pulling, MA, RCEP, CCRP, CCET;Joseph [place], [place]    Virtual Visit No    Medication changes reported     No    Fall or balance concerns reported    No    Warm-up and Cool-down Performed on first and last piece of equipment    Resistance Training Performed Yes    VAD Patient? No    PAD/SET Patient? No      Pain Assessment   Currently in Pain? No/denies                Social History   Tobacco Use  Smoking Status Never  Smokeless Tobacco Never    Goals Met:  Independence with exercise equipment Exercise tolerated well No report of concerns or symptoms today Strength training completed today  Goals Unmet:  Not Applicable  Comments: Pt able to follow exercise prescription today without complaint.  Will continue to monitor for progression.    Dr. Emily Filbert is Medical Director for [place].  Dr. Ottie Glazier is Medical Director for [facility] Pulmonary Rehabilitation.

## 2022-02-05 ENCOUNTER — Ambulatory Visit: Payer: Medicare Other | Attending: Pulmonary Disease

## 2022-02-05 ENCOUNTER — Telehealth: Payer: Self-pay | Admitting: Nurse Practitioner

## 2022-02-05 ENCOUNTER — Telehealth: Payer: Self-pay | Admitting: Cardiology

## 2022-02-05 DIAGNOSIS — R0683 Snoring: Secondary | ICD-10-CM | POA: Diagnosis present

## 2022-02-05 DIAGNOSIS — G4733 Obstructive sleep apnea (adult) (pediatric): Secondary | ICD-10-CM | POA: Insufficient documentation

## 2022-02-05 DIAGNOSIS — I272 Pulmonary hypertension, unspecified: Secondary | ICD-10-CM | POA: Insufficient documentation

## 2022-02-05 DIAGNOSIS — I2510 Atherosclerotic heart disease of native coronary artery without angina pectoris: Secondary | ICD-10-CM | POA: Insufficient documentation

## 2022-02-05 DIAGNOSIS — R0681 Apnea, not elsewhere classified: Secondary | ICD-10-CM | POA: Diagnosis present

## 2022-02-05 MED ORDER — CLOPIDOGREL BISULFATE 75 MG PO TABS: ORAL_TABLET | ORAL | 0 refills | Status: DC

## 2022-02-05 NOTE — Telephone Encounter (Signed)
Attempted to call the patient with provider recommendations.  No answer- his voice mail box if full.  Will attempt to call back at a later time.

## 2022-02-05 NOTE — Telephone Encounter (Signed)
I am okay with that.

## 2022-02-05 NOTE — Telephone Encounter (Signed)
I spoke with the patient. He is aware that Jaquelyn Bitter, NP has spoken with Dr. Ellyn Hack regarding a switch from Brilinta to Plavix and that Dr. Ellyn Hack his agreeable with this change.  The patient is advised he can: 1) Take his last dose of Brilinta tonight 2) Tomorrow morning he will start plavix 75 mg: Take 4 tablets (300 mg) by mouth x 1 dose 3) Saturday morning (Day #2) he will take plavix 75 mg: 1 tablet (75 mg) by mouth once daily going forward.  He would like a 30-day supply sent in to see how he tolerates this. I have advised him to give at least a week on the plavix and off brilinta to see how he feels. If he is not feeling any better, then he is to call the office for further advisement.  I have also confirmed with him that he is scheduled to follow up with Dr. Ellyn Hack, as requested, at his next appointment.   He was very appreciative of the call and voices understanding of the above.

## 2022-02-05 NOTE — Telephone Encounter (Signed)
Patient is requesting a provider switch from Dr. Garen Lah to Dr. Ellyn Hack due to Dr. Ellyn Hack performing his procedure. Please advise.

## 2022-02-05 NOTE — Telephone Encounter (Signed)
Pt c/o medication issue:  1. Name of Medication: ticagrelor (BRILINTA) 90 MG TABS tablet  2. How are you currently taking this medication (dosage and times per day)? As prescribed   3. Are you having a reaction (difficulty breathing--STAT)? Yes   4. What is your medication issue? Patient is calling stating he was advised at his last appointment that a switch from this medication to Plavix was going to be discussed with Dr. Ellyn Hack and he would be contacted regarding it. He reports this medication causes abdominal pain and headaches. Patient also stated a nurse advised him they were switching his a Cardiologist from Dr. Garen Lah to Dr. Ellyn Hack due to him doing his procedures in the hospital. Patient would like a callback regarding the status of both of these. A provider switch has been submitted for change in Cardiology.

## 2022-02-05 NOTE — Telephone Encounter (Signed)
FW: Changing Brilinta to Plavix  Solmon Ice, RN  Sent: Thu Feb 05, 2022  2:23 PM  To: [facility] Triage          Message    ----- Message -----  From: Marylu Lund., NP  Sent: 02/05/2022   2:10 PM EDT  To: Solmon Ice, RN  Subject: Changing Brilinta to Plavix                      Hello,  Please give Mr.Stroud a call and let him know that I was able to get in contact with Dr. Ellyn Hack regarding his Stephen.  Please have him stop his Brilinta starting tomorrow morning and send him a prescription for Plavix 75 mg daily.  Please have him take a one time loading dose of 300 mg tomorrow morning and resume 75 mg daily afterwards.  Please let me know if you have any questions.    Thank you    Thank you  Ambrose Pancoast, NP

## 2022-02-05 NOTE — Telephone Encounter (Signed)
Per previous phone encounter 02/02/22:  Rise Mu, PA-C   7:10 AM Note Case has been discussed with Ambrose Pancoast, NP.  He will follow up with Dr. Ellyn Hack regarding recommendations.      Will forward to Ambrose Pancoast, NP and Dr. Ellyn Hack for further recc.

## 2022-02-05 NOTE — Telephone Encounter (Signed)
Case has been discussed with Ambrose Pancoast, NP.  He will follow up with Dr. Ellyn Hack regarding recommendations.

## 2022-02-06 ENCOUNTER — Other Ambulatory Visit: Payer: Self-pay

## 2022-02-06 ENCOUNTER — Telehealth: Payer: Self-pay | Admitting: Primary Care

## 2022-02-06 ENCOUNTER — Telehealth: Payer: Medicare Other

## 2022-02-06 ENCOUNTER — Encounter: Payer: Medicare Other | Admitting: *Deleted

## 2022-02-06 DIAGNOSIS — Z5189 Encounter for other specified aftercare: Secondary | ICD-10-CM | POA: Diagnosis not present

## 2022-02-06 DIAGNOSIS — Z955 Presence of coronary angioplasty implant and graft: Secondary | ICD-10-CM

## 2022-02-06 DIAGNOSIS — I214 Non-ST elevation (NSTEMI) myocardial infarction: Secondary | ICD-10-CM

## 2022-02-06 MED ORDER — PROPRANOLOL HCL 40 MG PO TABS: 40.0000 mg | ORAL_TABLET | Freq: Two times a day (BID) | ORAL | 1 refills | Status: DC

## 2022-02-06 NOTE — Telephone Encounter (Signed)
Sleep study received from [facility]. Faxed to [place] at [facility] office for review.

## 2022-02-06 NOTE — Progress Notes (Signed)
Daily Session Note  Patient Details  Name: Grant Young MRN: [id number] Date of Birth: 02/29/1946 Referring Provider:   Flowsheet Row Cardiac Rehab from 12/18/2021 in [facility] Cardiac and Pulmonary Rehab  Referring Provider Ellyn Hack       Encounter Date: 02/06/2022  Check In:  Session Check In - 02/06/22 1123       Check-In   Supervising physician immediately available to respond to emergencies See telemetry face sheet for immediately available ER MD    Location [facility]-Cardiac & Pulmonary Rehab    Staff Present Heath Lark, RN, BSN, CCRP;Joseph [place], RCP,RRT,BSRT;Jessica [place], [place], [place], CCRP, CCET    Virtual Visit No    Medication changes reported     No    Fall or balance concerns reported    No    Warm-up and Cool-down Performed on first and last piece of equipment    Resistance Training Performed Yes    VAD Patient? No    PAD/SET Patient? No      Pain Assessment   Currently in Pain? No/denies            med change noted after check in   Brilinta changed to PLAVIX    Social History   Tobacco Use  Smoking Status Never  Smokeless Tobacco Never    Goals Met:  Independence with exercise equipment Exercise tolerated well No report of concerns or symptoms today  Goals Unmet:  Not Applicable  Comments: Pt able to follow exercise prescription today without complaint.  Will continue to monitor for progression.    Dr. Emily Filbert is Medical Director for [place].  Dr. Ottie Glazier is Medical Director for [facility] Pulmonary Rehabilitation.

## 2022-02-11 ENCOUNTER — Ambulatory Visit (INDEPENDENT_AMBULATORY_CARE_PROVIDER_SITE_OTHER): Payer: Medicare Other | Admitting: Pharmacist

## 2022-02-11 DIAGNOSIS — E7800 Pure hypercholesterolemia, unspecified: Secondary | ICD-10-CM

## 2022-02-11 DIAGNOSIS — Z5189 Encounter for other specified aftercare: Secondary | ICD-10-CM | POA: Diagnosis not present

## 2022-02-11 DIAGNOSIS — I10 Essential (primary) hypertension: Secondary | ICD-10-CM

## 2022-02-11 DIAGNOSIS — Z79899 Other long term (current) drug therapy: Secondary | ICD-10-CM

## 2022-02-11 DIAGNOSIS — I214 Non-ST elevation (NSTEMI) myocardial infarction: Secondary | ICD-10-CM

## 2022-02-11 DIAGNOSIS — Z955 Presence of coronary angioplasty implant and graft: Secondary | ICD-10-CM

## 2022-02-11 NOTE — Telephone Encounter (Signed)
See telephone encounter dated 02/05/22.

## 2022-02-11 NOTE — Patient Instructions (Signed)
Visit Information  Thank you for taking time to visit with me today. Please don't hesitate to contact me if I can be of assistance to you before our next scheduled telephone appointment.  Following are the goals we discussed today:   Goals Addressed             This Visit's Progress    Pharmacy Goals       Please check your home blood pressure, keep a log of the results and bring this with you to your medical appointments. Please call office sooner for readings outside of established parameters or symptoms.  Please follow up with Cardiology at [phone] regarding the referral to the [place] free to call me with any questions or concerns. I look forward to our next call!  Wallace Cullens, PharmD, [place] [phone]         Our next appointment is by telephone on 06/10/2022 at 1:00 PM  Please call the care guide team at [phone] if you need to cancel or reschedule your appointment.   Patient verbalizes understanding of instructions and care plan provided today and agrees to view in [facility]. Active MyChart status and patient understanding of how to access instructions and care plan via MyChart confirmed with patient.

## 2022-02-11 NOTE — Chronic Care Management (AMB) (Signed)
Chronic Care Management CCM Pharmacy Note  02/11/2022 Name:  Grant Young MRN:  [id number] DOB:  May 13, 1946   Subjective: Grant Young is an 76 y.o. year old male who is a primary patient of [name], NP.  The CCM team was consulted for assistance with disease management and care coordination needs.    Engaged with patient by telephone for follow up visit for pharmacy case management and/or care coordination services.   Objective:  Medications Reviewed Today     Reviewed by Rennis Petty, RPH-CPP (Pharmacist) on 02/11/22 at 1447  Med List Status: <None>   Medication Order Taking? Sig Documenting Provider Last Dose Status Informant  anastrozole (ARIMIDEX) 1 MG tablet [id number] Yes 1/2 tablet (0.5 mg) by mouth once weekly [provider] Taking Active Spouse/Significant Other  aspirin 81 MG chewable tablet [id number] Yes Chew 1 tablet (81 mg total) by mouth daily. Val Riles, MD Taking Active   butalbital-acetaminophen-caffeine [facility]) [phone] MG tablet [id number] Yes Take 1 tablet by mouth every 6 (six) hours as needed for headache. [provider] Taking Active Spouse/Significant Other  cetirizine (ZYRTEC) 10 MG tablet [id number] Yes Take 10 mg by mouth daily as needed for allergies. [provider] Taking Active Spouse/Significant Other  Cholecalciferol 50 MCG (2000 UT) CAPS [id number] Yes Take 1 tablet by mouth daily. [provider] Taking Active Spouse/Significant Other  clopidogrel (PLAVIX) 75 MG tablet [id number] Yes Day 1- take 4 tablets (300 mg) by mouth x 1 dose, then Day 2- start 1 tablet (75 mg) by mouth once daily Leonie Man, MD Taking Active   Cyanocobalamin (VITAMIN B-12) 5000 MCG LOZG [id number] Yes Take 1 lozenge by mouth daily.  [provider] Taking Active Spouse/Significant Other  DHEA 25 MG CAPS [id number] Yes Take 25 mg by mouth daily. [provider] Taking Active Spouse/Significant Other   diazepam (VALIUM) 5 MG tablet [id number] Yes TAKE 1 TABLET IN THE MORNING AND 2 TABLETS AT BEDTIME [name], NP Taking Active   dicyclomine (BENTYL) 20 MG tablet [id number] Yes Take 20 mg by mouth as needed. [provider] Taking Active Spouse/Significant Other  Digestive Aids Mixture (DIGESTION GB PO) [id number] Yes Take 1 capsule by mouth daily. [provider] Taking Active Spouse/Significant Other  escitalopram (LEXAPRO) 20 MG tablet [id number] Yes TAKE 1 TABLET BY MOUTH EVERY DAY [name], [name], NP Taking Active Spouse/Significant Other  ezetimibe (ZETIA) 10 MG tablet [id number] Yes Take 10 mg by mouth daily. [provider] Taking Active   HYDROcodone-acetaminophen (NORCO) 10-325 MG tablet [id number] Yes Take 1 tablet by mouth 3 (three) times daily as needed. [provider] Taking Active   Loperamide HCl (IMODIUM PO) [id number] Yes Take by mouth as needed.  [provider] Taking Active Spouse/Significant Other  MAGNESIUM BISGLYCINATE PO [id number] Yes Take by mouth daily. [provider] Taking Active Spouse/Significant Other  melatonin 5 MG TABS [id number] Yes Take 5 mg by mouth at bedtime. [provider] Taking Active Spouse/Significant Other  NONFORMULARY OR COMPOUNDED ITEM [id number]  Super Bi-Mix Injection  Papaverine HCI '30mg'$ /mL Phentolamine Mesylate '1mg'$ /mL Inject 0.71m - 118mIncrease as needed to achieve erection SnBilley CoMD  Active Spouse/Significant Other  Nutritional Supplements (NUTRITIONAL SUPPLEMENT PO) [id number] 1 capsule by mouth daily. [name] [provider]  Active Spouse/Significant Other  Nutritional Supplements (NUTRITIONAL SUPPLEMENT PO) [id number] by mouth daily. InflammaSaver [provider]  Active Spouse/Significant Other  ondansetron ([facility]  4 MG tablet [id number] No Take 4 mg by mouth every 6 (six) hours as needed for nausea or vomiting.  Patient not  taking: Reported on 02/11/2022   [provider] Not Taking Active Spouse/Significant Other  polyethylene glycol (MIRALAX / GLYCOLAX) 17 g packet [id number] Yes Take 17 g by mouth daily as needed. [provider] Taking Active Spouse/Significant Other  Probiotic Product (PROBIOTIC DAILY PO) [id number] Yes Take by mouth daily. [provider] Taking Active Spouse/Significant Other  prochlorperazine (COMPAZINE) 10 MG tablet [id number] Yes Take 10 mg by mouth. TAKE 1 TABLET (10 MG TOTAL) BY MOUTH EVERY 6 (SIX) HOURS AS NEEDED for HEADACHE AND NAUSEA, LIMIT USE TO 3 DAYS PER WEEK [provider] Taking Active Spouse/Significant Other  propranolol (INDERAL) 40 MG tablet [id number] Yes Take 1 tablet (40 mg total) by mouth 2 (two) times daily. [name], NP Taking Active   pyridostigmine (MESTINON) 60 MG tablet [id number] Yes TAKE 1 TABLET BY MOUTH 2 TIMES DAILY. [name], NP Taking Active            Med Note Wallace Cullens A   Wed Feb 11, 2022  2:31 PM) 1/2 tablet (30 mg) twice daily  RABEprazole (ACIPHEX) 20 MG tablet [id number] Yes TAKE 1 TABLET BY MOUTH TWICE A DAY [name], [name], NP Taking Active   sucralfate (CARAFATE) 1 g tablet [id number] Yes Take 1 g by mouth 4 (four) times daily as needed.  [provider] Taking Active Spouse/Significant Other  testosterone cypionate (DEPOTESTOSTERONE CYPIONATE) 200 MG/ML injection [id number] Yes Inject 80 mg into the muscle every 14 (fourteen) days. [provider] Taking Active Spouse/Significant Other  valACYclovir (VALTREX) 1000 MG tablet [id number] Yes Take 2 tabs p.o. and repeat in 12 hours as needed for cold sore [name], NP Taking Active Spouse/Significant Other  Med List Note Rise Patience, RN 10/09/21 1156): MR 12/08/21;  UDS 09-17-20            Pertinent Labs:   Lab Results  Component Value Date   CHOL 170 11/11/2021   HDL 48 11/11/2021   LDLCALC 103 (H) 11/11/2021    LDLDIRECT 98.5 01/30/2022   TRIG 99 11/11/2021   CHOLHDL 3.5 11/11/2021   Lab Results  Component Value Date   CREATININE 0.82 12/26/2021   BUN 14 12/26/2021   NA 146 (H) 12/26/2021   K 4.2 12/26/2021   CL 104 12/26/2021   CO2 27 12/26/2021   BP Readings from Last 3 Encounters:  01/30/22 124/80  01/05/22 138/80  12/26/21 132/78   Pulse Readings from Last 3 Encounters:  01/30/22 (!) 55  01/05/22 60  12/26/21 (!) 51     SDOH:  (Social Determinants of Health) assessments and interventions performed:    [place]  Review of patient past medical history, allergies, medications, health status, including review of consultants reports, laboratory and other test data, was performed as part of comprehensive evaluation and provision of chronic care management services.   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    Interventions: 1:1 collaboration with [name], NP regarding development and update of comprehensive plan of care as evidenced by provider attestation and co-signature Inter-disciplinary care team collaboration (see longitudinal plan of care) Perform chart review.  Patient seen for Office Visit with PCP on 4/4 for post hospital follow up Provider advised  patient to hold Chlorthalidone secondary to urinary frequency and controlled BPs to see if he can come off the [place] at some point Office Visit with [facility] on 4/5 for follow up of headaches Office Visit with [facility] on 4/14 for hospital follow up. Provider advised: Continue DAPT with aspirin and ticagrelor without interruption for at least 12 months dating back to date of PCI Goal LDL at least less than 70, likely less than 50.  A trial of discontinuing statin therapy to see how his symptoms (fatigue) trend Office Visit with [facility] on 4/24. Provider advised order placed for in-lab polysomnography to evaluate for OSA and RLS Office Visit with [facility] on 5/19. Provider advised: Will reach out to Dr. Ellyn Hack for recommendation of changing Brilinta to Plavix if headaches continue following sleep study Offered patient referral to lipid clinic Per telephone encounter from [facility] on 5/25 patient advised was per Jaquelyn Bitter, NP/Dr. Ellyn Hack on plan to switch him from Brilinta to clopidogrel (Plavix) Today patient reports his GI symptoms seem to be improved since switched from clopidogrel to Brilinta Reports completed sleep study as recommended by Pulmonology and note patient has appointment with pulmonology on 6/2 for follow up Reports continues to feel tired/weak, but feels like building up some strength with attending Cardiac rehab three times/week Comprehensive medication review performed; medication list updated in electronic medical record Have discussed risk of dizziness/sedation/falls with hydrocodone, diazepam, Fioricet, prochlorperazine and cetirizine  Patient reports that due to recent dizziness and fatigue has been working on reducing these types of medications, including: Reports recently stopped amitriptyline, after having tapered this dose down Taking hydrocodone-acetaminophen 10-325 mg - 1/2 to 1 tablet every 8  hours as needed Reports takes Fioricet, prochlorperazine and cetirizine only as needed  S/P NSTEMI/HTN/orthostatic hypotension/tremor: Patient followed by [facility] Current treatment: aspirin 81 mg daily Clopidogrel 75 mg daily Propranolol 40 twice daily Pyridostigmine 60 mg - 1/2 tablet (30 mg) twice daily Ezetimibe 10 mg daily Discuss LDL lowering for secondary prevention of ASCVD Previous therapies tried: atorvastatin  Today patient expresses interest in trying PCSK9 inhibitor Encourage patient to follow up with Cardiology regarding offered referral to lipid clinic Patient monitors home blood pressure with upper arm monitor, but denies monitoring recently Recalls reading from Cardiac rehab program this morning of 102/60  Encourage patient to monitor home blood pressure readings, keep log of results and bring this record with him to upcoming appointment with providers   Pill Burden: Patient has expressed frustration with pill burden Have discussed risk of dizziness/sedation/falls with hydrocodone, amitriptyline, diazepam, Fioricet, prochlorperazine, tizanidine and cetirizine  Note patient has stopped taking tizanidine and amitriptyline Reports feels like he has seen an improvement in symptoms sedation/drying effect since stopped amitriptyline Reported stopped taking lithium orotate supplement  Reports taking supplements including Natrol DHEA daily, [name], Digestion GB and [name] as recommended by Integrative Medicine provider Dr. Smith Robert in [place],  [place] Have cautioned patient about limited data with supplements, as unlike prescription medications, these do not undergo FDA approval and there is insufficient data to confirm safety, efficacy or potential for interactions Have discussed supplementation with dehydroepiandrosterone (DHEA) not recommeded in patients being treated with testosterone. DHEA may increase serum androgen levels,  especially at higher doses, resulting in a higher risk of androgenic adverse effects Reports taking DHEA supplement as recommended by Dr. Smith Robert, prescriber of testosterone injections Reports Dr. Smith Robert monitors blood work including testosterone level regularly, at least every 6 months Encouraged patient to follow up with Integrative Medicine provider to discuss opportunities to reduce supplements to reduce pill burden  Patient Goals/Self-Care Activities patient will:  - take medications as prescribed as evidenced by patient report and record review - check blood pressure, document, and provide at future appointments - attend medical appointments as scheduled        Plan: Telephone follow up appointment with care management team member scheduled for:  06/10/2022 at 1:00 PM  Wallace Cullens, PharmD, [place] [phone]

## 2022-02-11 NOTE — Progress Notes (Signed)
Daily Session Note  Patient Details  Name: Grant Young MRN: [id number] Date of Birth: 1945-11-22 Referring Provider:   Flowsheet Row Cardiac Rehab from 12/18/2021 in [facility] Cardiac and Pulmonary Rehab  Referring Provider Ellyn Hack       Encounter Date: 02/11/2022  Check In:  Session Check In - 02/11/22 1106       Check-In   Supervising physician immediately available to respond to emergencies See telemetry face sheet for immediately available ER MD    Location [facility]-Cardiac & Pulmonary Rehab    Staff Present Birdie Sons, MPA, RN;Joseph [place]land, RCP,RRT,BSRT;Noah Tickle, BS, Exercise Physiologist;Jessica [place], [place], RCEP, CCRP, CCET    Virtual Visit No    Medication changes reported     No    Fall or balance concerns reported    No    Warm-up and Cool-down Performed on first and last piece of equipment    Resistance Training Performed Yes    VAD Patient? No    PAD/SET Patient? No      Pain Assessment   Currently in Pain? No/denies                Social History   Tobacco Use  Smoking Status Never  Smokeless Tobacco Never    Goals Met:  Independence with exercise equipment Exercise tolerated well Personal goals reviewed No report of concerns or symptoms today Strength training completed today  Goals Unmet:  Not Applicable  Comments: Pt able to follow exercise prescription today without complaint.  Will continue to monitor for progression.  Reviewed home exercise with pt today.  Pt plans to walk and go to gym and water aerobics for exercise.  He works with a Physiological scientist at [employer].  Reviewed THR, pulse, RPE, sign and symptoms, pulse oximetery and when to call 911 or MD.  Also discussed weather considerations and indoor options.  Pt voiced understanding.   Dr. Emily Filbert is Medical Director for [place].  Dr. Ottie Glazier is Medical Director for [facility] Pulmonary Rehabilitation.

## 2022-02-12 ENCOUNTER — Telehealth: Payer: Self-pay | Admitting: Nurse Practitioner

## 2022-02-12 DIAGNOSIS — E785 Hyperlipidemia, unspecified: Secondary | ICD-10-CM

## 2022-02-12 NOTE — Telephone Encounter (Signed)
Yes, sleep study on 02/05/2022 showed mild obstructive sleep apnea, total apnea hypopnea index was 6.1/h (AHI in supine position was 9.6/h).  SPO2 low 91% with basal O2 96%.  He has a visit with me tomorrow at 430 we can discuss results at that visit.

## 2022-02-12 NOTE — Telephone Encounter (Signed)
Beth, have you received results?

## 2022-02-12 NOTE — Telephone Encounter (Signed)
New message:     Patient said he would like to be referred to the [facility].

## 2022-02-12 NOTE — Telephone Encounter (Signed)
Noted.  Will close encounter.  

## 2022-02-12 NOTE — Telephone Encounter (Signed)
Please refer to the lipid clinic at his request.

## 2022-02-13 ENCOUNTER — Encounter: Payer: Medicare Other | Attending: Cardiology | Admitting: *Deleted

## 2022-02-13 ENCOUNTER — Ambulatory Visit (INDEPENDENT_AMBULATORY_CARE_PROVIDER_SITE_OTHER): Payer: Medicare Other | Admitting: Primary Care

## 2022-02-13 ENCOUNTER — Encounter: Payer: Self-pay | Admitting: Primary Care

## 2022-02-13 DIAGNOSIS — I214 Non-ST elevation (NSTEMI) myocardial infarction: Secondary | ICD-10-CM | POA: Insufficient documentation

## 2022-02-13 DIAGNOSIS — G4730 Sleep apnea, unspecified: Secondary | ICD-10-CM

## 2022-02-13 DIAGNOSIS — Z955 Presence of coronary angioplasty implant and graft: Secondary | ICD-10-CM | POA: Diagnosis present

## 2022-02-13 NOTE — Progress Notes (Signed)
Virtual Visit via Telephone Note  I connected with Grant Young on 02/13/22 at  4:30 PM EDT by telephone and verified that I am speaking with the correct person using two identifiers.  Location: Patient: Home Provider: Office   I discussed the limitations, risks, security and privacy concerns of performing an evaluation and management service by telephone and the availability of in person appointments. I also discussed with the patient that there may be a patient responsible charge related to this service. The patient expressed understanding and agreed to proceed.   History of Present Illness: 76 year old male, never smoked.  Past medical history significant for hypertension, coronary artery disease, chronic migraine, GERD, hyperlipidemia, chronic pain syndrome, restless leg syndrome, tremor, spinal cord stimulator.  Preview [facility] pulmonary encounter: 01/05/2022 Patient presents today for sleep consult.  He had a sleep study done over 20 years ago that he reports was negative for sleep apnea.  He is having symptoms of loud snoring, restless sleep, daytime sleepiness. He has a sleep number smart bed that has shown restless sleep. When he is able to fall asleep it takes him less than 10 min. He gets about 7 hours of sleep. He can wake up anywhere from 1-4 times a night. His wife has witnessed him stop breathing while he is sleeping. He does not wake up feeling rested. He has chronic migraines and back troubles. He will lay down on the couch and fall asleep late morning. He may or may not go back to sleep after lunch until 4-5pm. After dinner he is usually awake until bedtime. Cardiologist took him off lipitor to see if this would help with drowsiness. He takes valium at bedtime for restless leg syndrome and peripheral neuropathy.   Sleep questionnaire Symptoms- snoring, restless sleep, daytime sleepiness/ non-restorative sleep Prior sleep study- > 20 years ago Bedtime- 11:30-12am  Time to fall  asleep- usually <10 mins (can take up to an hour) Nocturnal awakenings- 1-4 times  Start of day/out of bed- 8am  Weight changes- fluctuates  CPAP- No Oxygen- No Epworth-9  02/13/2022 Patient contacted today to go over sleep study results.  He was unable to do virtual visit and presented to the [place] office for his visit today despite being scheduled in [place]. Polysomnography was performed on Feb 05, 2022 at [facility] which showed mild sleep apnea, AHI was 6.1/hr (supine AHI 9.6 /hr) with SPO2 below 91%.  We reviewed sleep study results and treatment options.  He is not interested in CPAP at this time but will consider oral appliance.  He has a sleep number bed and is able to elevate head 30 degrees if sleeping on his back when needed.  He reports having difficulty time initiating and staying asleep.  He takes Valium two '5mg'$  tablets along with melatonin at bedtime for history of restless leg syndrome and peripheral neuropathy.  He also takes Norco every 8 hours for back pain.     Observations/Objective:  Able speak in full sentences without acute respiratory symptoms or shortness of breath  Assessment and Plan:  Mild OSA - NPSG on 02/05/2022 showed mild OSA, AHI 6.1/hr (supine AHI 9.6/hr) -Reviewed treatment options, patient not interested in CPAP treatment at this time but will consider oral appliance and notify us if he wants referral - Recommend side sleeping position or elevating head of bed 30 degrees when sleeping on his back - Recommend patient come into office to discuss further treatment options for insomnia and medication management  Follow Up Instructions:  First available with either Dr. Halford Chessman or Dr. Mortimer Fries to review insomnia/medication management  I discussed the assessment and treatment plan with the patient. The patient was provided an opportunity to ask questions and all were answered. The patient agreed with the plan and demonstrated an understanding of the instructions.    The patient was advised to call back or seek an in-person evaluation if the symptoms worsen or if the condition fails to improve as anticipated.  I provided 22 minutes of non-face-to-face time during this encounter.   Martyn Ehrich, NP

## 2022-02-13 NOTE — Telephone Encounter (Signed)
Called patient to let him know that order has been placed for referral to lipid clinic. Patient has been advised that lipid clinic will call him to schedule.

## 2022-02-13 NOTE — Telephone Encounter (Signed)
Can we get patient a visit with Dr. Halford Chessman or Dr. Mortimer Fries in [place] to discuss insomnia and medications. It would be a 30 min visit, new patient. If neither of them have openings in the next 6-8 weeks I can see him. Also can you mail AVS and copy of his sleep study.

## 2022-02-13 NOTE — Patient Instructions (Signed)
Sleep study on 02/05/2022 showed mild obstructive sleep apnea, average AHI was 6.1 an hour (worse in when sleeping on your back).  You had no significant oxygen desaturations.  Treatment options include weight loss, side sleeping position, oral appliance, CPAP therapy or referral to ENT for possible surgical options    Please let us know if you would like referral to Dr. Toy Cookey for oral appliance-her contact number is [phone]  Follow-up First available with Dr. Halford Chessman or Dr. Mortimer Fries to review insomnia/medication management

## 2022-02-13 NOTE — Progress Notes (Signed)
Daily Session Note  Patient Details  Name: JOHNSON ARIZOLA MRN: [id number] Date of Birth: 11-04-1945 Referring Provider:   Flowsheet Row Cardiac Rehab from 12/18/2021 in [facility] Cardiac and Pulmonary Rehab  Referring Provider Ellyn Hack       Encounter Date: 02/13/2022  Check In:  Session Check In - 02/13/22 1148       Check-In   Supervising physician immediately available to respond to emergencies See telemetry face sheet for immediately available ER MD    Location [facility]-Cardiac & Pulmonary Rehab    Staff Present Heath Lark, RN, BSN, CCRP;Joseph [place], RCP,RRT,BSRT;Jessica [place], [place], [place], CCRP, CCET    Virtual Visit No    Medication changes reported     No    Fall or balance concerns reported    No    Warm-up and Cool-down Performed on first and last piece of equipment    Resistance Training Performed Yes    VAD Patient? No    PAD/SET Patient? No      Pain Assessment   Currently in Pain? No/denies                Social History   Tobacco Use  Smoking Status Never  Smokeless Tobacco Never    Goals Met:  Independence with exercise equipment Exercise tolerated well No report of concerns or symptoms today  Goals Unmet:  Not Applicable  Comments: Pt able to follow exercise prescription today without complaint.  Will continue to monitor for progression.    Dr. Emily Filbert is Medical Director for [place].  Dr. Ottie Glazier is Medical Director for [facility] Pulmonary Rehabilitation.

## 2022-02-16 ENCOUNTER — Encounter: Payer: Medicare Other | Admitting: *Deleted

## 2022-02-16 DIAGNOSIS — I214 Non-ST elevation (NSTEMI) myocardial infarction: Secondary | ICD-10-CM | POA: Diagnosis not present

## 2022-02-16 DIAGNOSIS — Z955 Presence of coronary angioplasty implant and graft: Secondary | ICD-10-CM

## 2022-02-16 NOTE — Telephone Encounter (Signed)
I am alright with them, if something opens up sooner we can let him know

## 2022-02-16 NOTE — Telephone Encounter (Signed)
Patient is aware of below message and voiced his understanding.  Nothing further needed.   

## 2022-02-16 NOTE — Telephone Encounter (Signed)
AVS and sleep study mailed to address on file. First available with Dr. Halford Chessman in [place] his 05/14/2022. Appt scheduled at 9:30. Patient stated that he would be willing to travel to [facility] for sooner appt if [facility] felt that he needed to be seen sooner.  Beth, please advise. thanks

## 2022-02-16 NOTE — Progress Notes (Signed)
Daily Session Note  Patient Details  Name: Grant Young MRN: [id number] Date of Birth: [birth year] Referring Provider:   Flowsheet Row Cardiac Rehab from 12/18/2021 in [facility] Cardiac and Pulmonary Rehab  Referring Provider Ellyn Hack       Encounter Date: 02/16/2022  Check In:  Session Check In - 02/16/22 1139       Check-In   Supervising physician immediately available to respond to emergencies See telemetry face sheet for immediately available ER MD    Location [facility]-Cardiac & Pulmonary Rehab    Staff Present Birdie Sons, MPA, Mauricia [place], BS, ACSM CEP, Exercise Physiologist;Kara Eliezer Bottom, MS, ASCM CEP, Exercise Physiologist;Kino [place], RN, BSN, CCRP    Virtual Visit No    Medication changes reported     No    Fall or balance concerns reported    No    Warm-up and Cool-down Performed on first and last piece of equipment    Resistance Training Performed Yes    VAD Patient? No    PAD/SET Patient? No      Pain Assessment   Currently in Pain? No/denies                Social History   Tobacco Use  Smoking Status Never  Smokeless Tobacco Never    Goals Met:  Independence with exercise equipment Exercise tolerated well No report of concerns or symptoms today  Goals Unmet:  Not Applicable  Comments: Pt able to follow exercise prescription today without complaint.  Will continue to monitor for progression.    Dr. Emily Filbert is Medical Director for [place].  Dr. Ottie Glazier is Medical Director for [facility] Pulmonary Rehabilitation.

## 2022-02-18 DIAGNOSIS — I214 Non-ST elevation (NSTEMI) myocardial infarction: Secondary | ICD-10-CM | POA: Diagnosis not present

## 2022-02-18 DIAGNOSIS — Z955 Presence of coronary angioplasty implant and graft: Secondary | ICD-10-CM

## 2022-02-18 NOTE — Progress Notes (Signed)
Daily Session Note  Patient Details  Name: QUINTERIUS GAIDA MRN: [id number] Date of Birth: 1946/06/08 Referring Provider:   Flowsheet Row Cardiac Rehab from 12/18/2021 in [facility] Cardiac and Pulmonary Rehab  Referring Provider Ellyn Hack       Encounter Date: 02/18/2022  Check In:  Session Check In - 02/18/22 1049       Check-In   Supervising physician immediately available to respond to emergencies See telemetry face sheet for immediately available ER MD    Location [facility]-Cardiac & Pulmonary Rehab    Staff Present Carson Myrtle, BS, RRT, CPFT;Joseph Karie Fetch, MPA, RN    Virtual Visit No    Medication changes reported     No    Fall or balance concerns reported    No    Warm-up and Cool-down Performed on first and last piece of equipment    Resistance Training Performed Yes    VAD Patient? No    PAD/SET Patient? No      Pain Assessment   Currently in Pain? No/denies                Social History   Tobacco Use  Smoking Status Never  Smokeless Tobacco Never    Goals Met:  Independence with exercise equipment Exercise tolerated well No report of concerns or symptoms today Strength training completed today  Goals Unmet:  Not Applicable  Comments: Pt able to follow exercise prescription today without complaint.  Will continue to monitor for progression.    Dr. Emily Filbert is Medical Director for [place].  Dr. Ottie Glazier is Medical Director for [facility] Pulmonary Rehabilitation.

## 2022-02-19 ENCOUNTER — Encounter: Payer: Self-pay | Admitting: Student in an Organized Health Care Education/Training Program

## 2022-02-19 ENCOUNTER — Ambulatory Visit
Payer: Medicare Other | Attending: Student in an Organized Health Care Education/Training Program | Admitting: Student in an Organized Health Care Education/Training Program

## 2022-02-19 VITALS — BP 124/77 | HR 52 | Temp 97.2°F | Ht 69.0 in | Wt 142.0 lb

## 2022-02-19 DIAGNOSIS — Z9689 Presence of other specified functional implants: Secondary | ICD-10-CM | POA: Diagnosis present

## 2022-02-19 DIAGNOSIS — M792 Neuralgia and neuritis, unspecified: Secondary | ICD-10-CM | POA: Insufficient documentation

## 2022-02-19 DIAGNOSIS — M47816 Spondylosis without myelopathy or radiculopathy, lumbar region: Secondary | ICD-10-CM | POA: Insufficient documentation

## 2022-02-19 DIAGNOSIS — M5481 Occipital neuralgia: Secondary | ICD-10-CM | POA: Insufficient documentation

## 2022-02-19 MED ORDER — HYDROCODONE-ACETAMINOPHEN 10-325 MG PO TABS: 1.0000 | ORAL_TABLET | Freq: Three times a day (TID) | ORAL | 0 refills | Status: DC | PRN

## 2022-02-19 NOTE — Progress Notes (Signed)
PROVIDER NOTE: Information contained herein reflects review and annotations entered in association with encounter. Interpretation of such information and data should be left to medically-trained personnel. Information provided to patient can be located elsewhere in the medical record under "Patient Instructions". Document created using STT-dictation technology, any transcriptional errors that may result from process are unintentional.    Patient: Grant Young  Service Category: E/M  Provider: [name], MD  DOB: 10-18-1945  DOS: 02/19/2022  Specialty: Interventional Pain Management  MRN: [id number]  Setting: Ambulatory outpatient  PCP: [name], NP  Type: Established Patient    Referring Provider: [name], NP  Location: Office  Delivery: Face-to-face     HPI  Mr. SIRE POET, a 76 y.o. year old male, is here today because of his Lumbar facet arthropathy [M47.816]. Mr. Lama primary complain today is Back Pain (lower) Last encounter: My last encounter with him was on 11/27/21 Pertinent problems: Mr. 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 Pain Assessment: Severity of Chronic pain is reported as a 4 /10. Location: Back Lower/Denies. Onset: More than a month ago. Quality: Aching, Sharp, Constant. Timing: Constant. Modifying factor(s): Meds, ice and heat. Vitals:  height is $RemoveB'5\' 9"'tkAqavFW$  (1.753 m) and weight is 142 lb (64.4 kg). His temperature is 97.2 F (36.2 C) (abnormal). His blood pressure is 124/77 and his pulse is 52 (abnormal). His oxygen saturation is 100%.   Reason for encounter: medication management.   Patient presents today for medication management.  Unfortunately, since his last clinic visit with me, patient had a NSTEMI for which she received 2 DES coronary stents.  He had a drug-eluting stent placed to the  left anterior descending and left circumflex.  He is on dual antiplatelet therapy with aspirin 81 and Plavix.    Pharmacotherapy Assessment  Analgesic: Hydrocodone IR 10 mg TID prn    Monitoring: [place] PMP: PDMP reviewed during this encounter.       Pharmacotherapy: No side-effects or adverse reactions reported. Compliance: No problems identified. Effectiveness: Clinically acceptable.  Chauncey Fischer, RN  02/19/2022 11:37 AM  Sign when Signing Visit Nursing Pain Medication Assessment:  Safety precautions to be maintained throughout the outpatient stay will include: orient to surroundings, keep bed in low position, maintain call bell within reach at all times, provide assistance with transfer out of bed and ambulation.  Medication Inspection Compliance: Pill count conducted under aseptic conditions, in front of the patient. Neither the pills nor the bottle was removed from the patient's sight at any time. Once count was completed pills were immediately returned to the patient in their original bottle.  Medication: Hydrocodone/APAP Pill/Patch Count:  56 of 90 pills remain Pill/Patch Appearance: Markings consistent with prescribed medication Bottle Appearance: Standard pharmacy container. Clearly labeled. Filled Date: 5 / 23 / 2023 Last Medication intake:  TodaySafety precautions to be maintained throughout the outpatient stay will include: orient to surroundings, keep bed in low position, maintain call bell within reach at all times, provide assistance with transfer out of bed and ambulation.      UDS:  Summary  Date Value Ref Range Status  09/17/2020 Note  Final    Comment:    ==================================================================== Compliance Drug Analysis, Ur ==================================================================== Specimen Alert Note: Urinary creatinine is low; ability to detect some drugs may be compromised. Interpret results with caution.  (Creatinine) ==================================================================== Test  Result       Flag       Units  Drug Present and Declared for Prescription Verification   Lorazepam                      1107         EXPECTED   ng/mg creat    Source of lorazepam is a scheduled prescription medication.    Butalbital                     PRESENT      EXPECTED   Citalopram                     PRESENT      EXPECTED   Desmethylcitalopram            PRESENT      EXPECTED    Desmethylcitalopram is an expected metabolite of citalopram or the    enantiomeric form, escitalopram.    Acetaminophen                  PRESENT      EXPECTED   Propranolol                    PRESENT      EXPECTED  Drug Present not Declared for Prescription Verification   Oxazepam                       300          UNEXPECTED ng/mg creat   Temazepam                      357          UNEXPECTED ng/mg creat    Oxazepam and temazepam are expected metabolites of diazepam.    Oxazepam is also an expected metabolite of other benzodiazepine    drugs, including chlordiazepoxide, prazepam, clorazepate, halazepam,    and temazepam.  Oxazepam and temazepam are available as scheduled    prescription medications.  Drug Absent but Declared for Prescription Verification   Hydrocodone                    Not Detected UNEXPECTED ng/mg creat   Tizanidine                     Not Detected UNEXPECTED    Tizanidine, as indicated in the declared medication list, is not    always detected even when used as directed.    Amitriptyline                  Not Detected UNEXPECTED   Prochlorperazine               Not Detected UNEXPECTED   Salicylate                     Not Detected UNEXPECTED    Aspirin, as indicated in the declared medication list, is not always    detected even when used as directed.  ==================================================================== Test                      Result    Flag    Units      Ref Range   Creatinine              14  LL     mg/dL      >=20 ==================================================================== Declared Medications:  The flagging and interpretation on this report are based on the  following declared medications.  Unexpected results may arise from  inaccuracies in the declared medications.   **Note: The testing scope of this panel includes these medications:   Amitriptyline (Elavil)  Butalbital (Fioricet)  Butalbital (Fiorinal)  Escitalopram (Lexapro)  Hydrocodone (Norco)  Lorazepam (Ativan)  Prochlorperazine (Compazine)  Propranolol (Inderal)   **Note: The testing scope of this panel does not include small to  moderate amounts of these reported medications:   Acetaminophen (Fioricet)  Acetaminophen (Norco)  Aspirin (Fiorinal)  Tizanidine (Zanaflex)   **Note: The testing scope of this panel does not include the  following reported medications:   Anastrozole (Arimidex)  Caffeine (Fioricet)  Caffeine (Fiorinal)  Chlorthalidone (Hygroton)  Cholecalciferol  Cholestyramine (Questran)  Dicyclomine (Bentyl)  Ezetimibe (Zetia)  Hydrocortisone  Melatonin  Ondansetron (Zofran)  Potassium (Klor-Con)  Rabeprazole (Aciphex)  Sucralfate (Carafate)  Valacyclovir (Valtrex)  Vitamin B12 ==================================================================== For clinical consultation, please call [phone]. ====================================================================      ROS  Constitutional: Denies any fever or chills Gastrointestinal: No reported hemesis, hematochezia, vomiting, or acute GI distress Musculoskeletal:  Low back pain Neurological: No reported episodes of acute onset apraxia, aphasia, dysarthria, agnosia, amnesia, paralysis, loss of coordination, or loss of consciousness  Medication Review  Cholecalciferol, DHEA, Digestive Aids Mixture, HYDROcodone-acetaminophen, Loperamide HCl, Magnesium  Bisglycinate, NONFORMULARY OR COMPOUNDED ITEM, Nutritional Supplements, Probiotic Product, RABEprazole, Vitamin B-12, anastrozole, aspirin, butalbital-acetaminophen-caffeine, cetirizine, clopidogrel, diazepam, dicyclomine, escitalopram, ezetimibe, melatonin, ondansetron, polyethylene glycol, prochlorperazine, propranolol, pyridostigmine, sucralfate, testosterone cypionate, and valACYclovir  History Review  Allergy: Mr. Beaumier is allergic to ace inhibitors, fluoxetine, metoclopramide, nalbuphine, other, amoxicillin-pot clavulanate, doxazosin, duloxetine, penicillins, tamsulosin, trazodone and nefazodone, amlodipine, cinoxacin, ciprofloxacin, fludrocortisone, nebivolol, olanzapine, olmesartan, pregabalin, prostaglandins, thyroid hormones, zolpidem, duloxetine hcl, fluoxetine hcl, nucynta [tapentadol], and phenytoin. Drug: Mr. Marczak  reports that he does not currently use drugs. Alcohol:  reports current alcohol use. Tobacco:  reports that he has never smoked. He has never used smokeless tobacco. Social: Mr. Macdonnell  reports that he has never smoked. He has never used smokeless tobacco. He reports current alcohol use. He reports that he does not currently use drugs. Medical:  has a past medical history of Anxiety, Chronic back pain, Chronic heart disease, Depression, GERD (gastroesophageal reflux disease), Headache, History of CT scan of brain (09/14/2018), History of depression, History of gastritis, History of headache, History of kidney stones, History of neuropathy, Hypertension, Insomnia, Kidney stone, Lumbar radicular pain, Lyme disease, Myocardial infarction ([place]), Overactive bladder, PONV (postoperative nausea and vomiting), Skin cancer, basal cell, Sleep trouble, Spinal cord stimulator status, Squamous cell skin cancer, and Substance abuse ([place]). Surgical: Mr. Lepkowski  has a past surgical history that includes Kidney stone surgery (09/14/1980); Kidney stone surgery (09/15/1995); Lithotripsy (09/14/2014);  Lithotripsy (09/14/1996); Lithotripsy (09/14/1997); Gallbladder surgery (09/15/2015); Sigmoidoscopy (09/14/2017); Colonoscopy (09/15/2015); Cholecystectomy (2017); Shoulder surgery (Right, 1984); Back surgery; Spinal cord stimulator implant; Pain pump implantation; Pain pump removal; Tonsillectomy; Foot surgery (Bilateral, 03/18/2020); Foot surgery (08/032021); Upper gi endoscopy; Cardiac catheterization; Colonoscopy with propofol (N/A, 10/03/2020); Esophagogastroduodenoscopy (egd) with propofol (N/A, 10/03/2020); Cataract extraction w/PHACO (Left, 01/14/2021); Cataract extraction w/PHACO (Right, 01/28/2021); Coronary/Graft Acute MI Revascularization (N/A, 11/27/2021); and LEFT HEART CATH AND CORONARY ANGIOGRAPHY (N/A, 11/27/2021). Family: family history includes Anxiety disorder in his daughter; Dementia in his mother; Heart disease in his father; Heart failure in his father; Hypertension in his father; Kidney Stones in his  daughter; OCD in his daughter; Stroke in his father and mother.  Laboratory Chemistry Profile   Renal Lab Results  Component Value Date   BUN 14 12/26/2021   CREATININE 0.82 12/26/2021   BCR 17 12/26/2021   GFRAA 101 05/20/2020   GFRNONAA >60 11/30/2021    Hepatic Lab Results  Component Value Date   AST 15 01/30/2022   ALT 16 01/30/2022   ALBUMIN 3.8 01/30/2022   ALKPHOS 81 01/30/2022    Electrolytes Lab Results  Component Value Date   NA 146 (H) 12/26/2021   K 4.2 12/26/2021   CL 104 12/26/2021   CALCIUM 9.3 12/26/2021   MG 2.0 11/28/2021   PHOS 3.6 11/30/2021    Bone Lab Results  Component Value Date   TESTOSTERONE 33.2 05/20/2020    Inflammation (CRP: Acute Phase) (ESR: Chronic Phase) No results found for: "CRP", "ESRSEDRATE", "LATICACIDVEN"       Note: Above Lab results reviewed.   Physical Exam  General appearance: Well nourished, well developed, and well hydrated. In no apparent acute distress Mental status: Alert, oriented x 3 (person, place, & time)        Respiratory: No evidence of acute respiratory distress Eyes: PERLA Vitals: BP 124/77   Pulse (!) 52   Temp (!) 97.2 F (36.2 C)   Ht $R'5\' 9"'ZN$  (1.753 m)   Wt 142 lb (64.4 kg)   SpO2 100%   BMI 20.97 kg/m  BMI: Estimated body mass index is 20.97 kg/m as calculated from the following:   Height as of this encounter: $RemoveBeforeD'5\' 9"'XVfIUskcRTDNMI$  (1.753 m).   Weight as of this encounter: 142 lb (64.4 kg). Ideal: Ideal body weight: 70.7 kg (155 lb 13.8 oz)  Lumbar Spine Area Exam  Skin & Axial Inspection: Well healed scar from previous spine surgery detected IPG present from SCS Alignment: Scoliosis detected Functional ROM: Pain restricted ROM       Stability: No instability detected Muscle Tone/Strength: Functionally intact. No obvious neuro-muscular anomalies detected. Sensory (Neurological): Facet agenic pain pattern, pain with facet loading Gait & Posture Assessment  Ambulation: Unassisted Gait: Relatively normal for age and body habitus Posture: WNL    Lower Extremity Exam      Side: Right lower extremity   Side: Left lower extremity  Stability: No instability observed           Stability: No instability observed          Skin & Extremity Inspection: Skin color, temperature, and hair growth are WNL. No peripheral edema or cyanosis. No masses, redness, swelling, asymmetry, or associated skin lesions. No contractures.   Skin & Extremity Inspection: Skin color, temperature, and hair growth are WNL. No peripheral edema or cyanosis. No masses, redness, swelling, asymmetry, or associated skin lesions. No contractures.  Functional ROM: Unrestricted ROM                   Functional ROM: Unrestricted ROM                  Muscle Tone/Strength: Functionally intact. No obvious neuro-muscular anomalies detected.   Muscle Tone/Strength: Functionally intact. No obvious neuro-muscular anomalies detected.  Sensory (Neurological): Unimpaired         Sensory (Neurological): Unimpaired        DTR: Patellar: deferred  today Achilles: deferred today Plantar: deferred today   DTR: Patellar: deferred today Achilles: deferred today Plantar: deferred today  Palpation: No palpable anomalies   Palpation: No palpable anomalies  Assessment   Status Diagnosis  Controlled Controlled Controlled 1. Lumbar facet arthropathy   2. Lumbar spondylosis   3. Neuropathic pain   4. 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 Bilateral occipital neuralgia       Plan of Care    Mr. ADVIK WEATHERSPOON has a current medication list which includes the following long-term medication(s): cetirizine, dicyclomine, escitalopram, propranolol, pyridostigmine, rabeprazole, sucralfate, and testosterone cypionate.  Pharmacotherapy (Medications Ordered): Meds ordered this encounter  Medications   HYDROcodone-acetaminophen (NORCO) 10-325 MG tablet    Sig: Take 1 tablet by mouth 3 (three) times daily as needed.    Dispense:  30 tablet    Refill:  0   HYDROcodone-acetaminophen (NORCO) 10-325 MG tablet    Sig: Take 1 tablet by mouth 3 (three) times daily as needed.    Dispense:  30 tablet    Refill:  0   HYDROcodone-acetaminophen (NORCO) 10-325 MG tablet    Sig: Take 1 tablet by mouth 3 (three) times daily as needed.    Dispense:  30 tablet    Refill:  0   Continue with Cardiac Rehab and post-PCI recommendations.  Follow-up plan:   Return in about 14 weeks (around 05/28/2022) for Medication Management, in person.     Status post Botox No. 1 on 01/01/2020 for migraine, lumbar TPI as well. SPG block 03/13/20. Bilateral GONB 04/01/20. 11/13/20 B/L L3,4,5 Facet blocks helpful repeat prn           Recent Visits Date Type Provider Dept  11/27/21 Office Visit [name], MD [facility]  Showing recent visits within past 90 days and meeting all other requirements Today's Visits Date Type Provider Dept  02/19/22 Office Visit [name], MD [facility]  Showing today's visits and meeting all  other requirements Future Appointments No visits were found meeting these conditions. Showing future appointments within next 90 days and meeting all other requirements  I discussed the assessment and treatment plan with the patient. The patient was provided an opportunity to ask questions and all were answered. The patient agreed with the plan and demonstrated an understanding of the instructions.  Patient advised to call back or seek an in-person evaluation if the symptoms or condition worsens.  Duration of encounter: 31minutes.  Note by: [name], MD Date: 02/19/2022; Time: 11:52 AM

## 2022-02-19 NOTE — Progress Notes (Signed)
Nursing Pain Medication Assessment:  Safety precautions to be maintained throughout the outpatient stay will include: orient to surroundings, keep bed in low position, maintain call bell within reach at all times, provide assistance with transfer out of bed and ambulation.  Medication Inspection Compliance: Pill count conducted under aseptic conditions, in front of the patient. Neither the pills nor the bottle was removed from the patient's sight at any time. Once count was completed pills were immediately returned to the patient in their original bottle.  Medication: Hydrocodone/APAP Pill/Patch Count:  56 of 90 pills remain Pill/Patch Appearance: Markings consistent with prescribed medication Bottle Appearance: Standard pharmacy container. Clearly labeled. Filled Date: 5 / 23 / 2023 Last Medication intake:  TodaySafety precautions to be maintained throughout the outpatient stay will include: orient to surroundings, keep bed in low position, maintain call bell within reach at all times, provide assistance with transfer out of bed and ambulation.

## 2022-02-20 ENCOUNTER — Encounter: Payer: Medicare Other | Admitting: *Deleted

## 2022-02-20 DIAGNOSIS — I214 Non-ST elevation (NSTEMI) myocardial infarction: Secondary | ICD-10-CM

## 2022-02-20 DIAGNOSIS — Z955 Presence of coronary angioplasty implant and graft: Secondary | ICD-10-CM

## 2022-02-20 NOTE — Progress Notes (Signed)
Daily Session Note  Patient Details  Name: Grant Young MRN: [id number] Date of Birth: 10-May-1946 Referring Provider:   Flowsheet Row Cardiac Rehab from 12/18/2021 in [facility] Cardiac and Pulmonary Rehab  Referring Provider Ellyn Hack       Encounter Date: 02/20/2022  Check In:  Session Check In - 02/20/22 1145       Check-In   Supervising physician immediately available to respond to emergencies See telemetry face sheet for immediately available ER MD    Location [facility]-Cardiac & Pulmonary Rehab    Staff Present Heath Lark, RN, BSN, CCRP;Joseph [place], [place]    Virtual Visit No    Medication changes reported     No    Fall or balance concerns reported    No    Warm-up and Cool-down Performed on first and last piece of equipment    Resistance Training Performed Yes    VAD Patient? No    PAD/SET Patient? No      Pain Assessment   Currently in Pain? No/denies                Social History   Tobacco Use  Smoking Status Never  Smokeless Tobacco Never    Goals Met:  Independence with exercise equipment Exercise tolerated well No report of concerns or symptoms today  Goals Unmet:  Not Applicable  Comments: Pt able to follow exercise prescription today without complaint.  Will continue to monitor for progression.    Dr. Emily Filbert is Medical Director for [place].  Dr. Ottie Glazier is Medical Director for [facility] Pulmonary Rehabilitation.

## 2022-02-23 ENCOUNTER — Encounter: Payer: Medicare Other | Admitting: *Deleted

## 2022-02-23 DIAGNOSIS — I214 Non-ST elevation (NSTEMI) myocardial infarction: Secondary | ICD-10-CM | POA: Diagnosis not present

## 2022-02-23 DIAGNOSIS — Z955 Presence of coronary angioplasty implant and graft: Secondary | ICD-10-CM

## 2022-02-23 NOTE — Progress Notes (Signed)
Daily Session Note  Patient Details  Name: Grant Young MRN: [id number] Date of Birth: 03/04/1946 Referring Provider:   Flowsheet Row Cardiac Rehab from 12/18/2021 in [facility] Cardiac and Pulmonary Rehab  Referring Provider Ellyn Hack       Encounter Date: 02/23/2022  Check In:  Session Check In - 02/23/22 1123       Check-In   Supervising physician immediately available to respond to emergencies See telemetry face sheet for immediately available ER MD    Location [facility]-Cardiac & Pulmonary Rehab    Staff Present Heath Lark, RN, BSN, Laveda Norman, BS, ACSM CEP, Exercise Physiologist;Joseph [place], [place]    Virtual Visit No    Medication changes reported     No    Fall or balance concerns reported    No    Warm-up and Cool-down Performed on first and last piece of equipment    Resistance Training Performed Yes    VAD Patient? No    PAD/SET Patient? No      Pain Assessment   Currently in Pain? No/denies                Social History   Tobacco Use  Smoking Status Never  Smokeless Tobacco Never    Goals Met:  Independence with exercise equipment Exercise tolerated well No report of concerns or symptoms today  Goals Unmet:  Not Applicable  Comments: Pt able to follow exercise prescription today without complaint.  Will continue to monitor for progression.    Dr. Emily Filbert is Medical Director for [place].  Dr. Ottie Glazier is Medical Director for [facility] Pulmonary Rehabilitation.

## 2022-02-25 ENCOUNTER — Encounter: Payer: Self-pay | Admitting: *Deleted

## 2022-02-25 DIAGNOSIS — I214 Non-ST elevation (NSTEMI) myocardial infarction: Secondary | ICD-10-CM

## 2022-02-25 DIAGNOSIS — Z955 Presence of coronary angioplasty implant and graft: Secondary | ICD-10-CM

## 2022-02-25 NOTE — Progress Notes (Signed)
Cardiac Individual Treatment Plan  Patient Details  Name: Grant Young MRN: [id number] Date of Birth: 20-Aug-1946 Referring Provider:   Flowsheet Row Cardiac Rehab from 12/18/2021 in [facility] Cardiac and Pulmonary Rehab  Referring Provider Ellyn Hack       Initial Encounter Date:  Flowsheet Row Cardiac Rehab from 12/18/2021 in [facility] Cardiac and Pulmonary Rehab  Date 12/18/21       Visit Diagnosis: NSTEMI (non-ST elevated myocardial infarction) [facility])  Status post coronary artery stent placement  Patient's Home Medications on Admission:  Current Outpatient Medications:    anastrozole (ARIMIDEX) 1 MG tablet, 1/2 tablet (0.5 mg) by mouth once weekly, Disp: , Rfl:    aspirin 81 MG chewable tablet, Chew 1 tablet (81 mg total) by mouth daily., Disp: 90 tablet, Rfl: 3   butalbital-acetaminophen-caffeine (FIORICET) 50-325-40 MG tablet, Take 1 tablet by mouth every 6 (six) hours as needed for headache., Disp: , Rfl:    cetirizine (ZYRTEC) 10 MG tablet, Take 10 mg by mouth daily as needed for allergies., Disp: , Rfl:    Cholecalciferol 50 MCG (2000 UT) CAPS, Take 1 tablet by mouth daily., Disp: , Rfl:    clopidogrel (PLAVIX) 75 MG tablet, Day 1- take 4 tablets (300 mg) by mouth x 1 dose, then Day 2- start 1 tablet (75 mg) by mouth once daily, Disp: 34 tablet, Rfl: 0   Cyanocobalamin (VITAMIN B-12) 5000 MCG LOZG, Take 1 lozenge by mouth daily. , Disp: , Rfl:    DHEA 25 MG CAPS, Take 25 mg by mouth daily., Disp: , Rfl:    diazepam (VALIUM) 5 MG tablet, TAKE 1 TABLET IN THE MORNING AND 2 TABLETS AT BEDTIME, Disp: 90 tablet, Rfl: 0   dicyclomine (BENTYL) 20 MG tablet, Take 20 mg by mouth as needed., Disp: , Rfl:    Digestive Aids Mixture (DIGESTION GB PO), Take 1 capsule by mouth daily., Disp: , Rfl:    escitalopram (LEXAPRO) 20 MG tablet, TAKE 1 TABLET BY MOUTH EVERY DAY, Disp: 90 tablet, Rfl: 1   ezetimibe (ZETIA) 10 MG tablet, Take 10 mg by mouth daily., Disp: , Rfl:    [START ON 03/05/2022]  HYDROcodone-acetaminophen (NORCO) 10-325 MG tablet, Take 1 tablet by mouth 3 (three) times daily as needed., Disp: 30 tablet, Rfl: 0   [START ON 04/04/2022] HYDROcodone-acetaminophen (NORCO) 10-325 MG tablet, Take 1 tablet by mouth 3 (three) times daily as needed., Disp: 30 tablet, Rfl: 0   [START ON 05/04/2022] HYDROcodone-acetaminophen (NORCO) 10-325 MG tablet, Take 1 tablet by mouth 3 (three) times daily as needed., Disp: 30 tablet, Rfl: 0   Loperamide HCl (IMODIUM PO), Take by mouth as needed. , Disp: , Rfl:    MAGNESIUM BISGLYCINATE PO, Take by mouth daily., Disp: , Rfl:    melatonin 5 MG TABS, Take 5 mg by mouth at bedtime., Disp: , Rfl:    NONFORMULARY OR COMPOUNDED ITEM, Super Bi-Mix Injection  Papaverine HCI 42m/mL Phentolamine Mesylate 127mmL Inject 0.64m42m 1mL764mcrease as needed to achieve erection, Disp: 5 each, Rfl: 6   Nutritional Supplements (NUTRITIONAL SUPPLEMENT PO), Take 1 capsule by mouth daily. [name], Disp: , Rfl:    Nutritional Supplements (NUTRITIONAL SUPPLEMENT PO), Take by mouth daily. InflammaSaver, Disp: , Rfl:    ondansetron (ZOFRAN) 4 MG tablet, Take 4 mg by mouth every 6 (six) hours as needed for nausea or vomiting. (Patient not taking: Reported on 02/11/2022), Disp: , Rfl:    polyethylene glycol (MIRALAX / GLYCOLAX) 17 g packet, Take  17 g by mouth daily as needed., Disp: , Rfl:    Probiotic Product (PROBIOTIC DAILY PO), Take by mouth daily., Disp: , Rfl:    prochlorperazine (COMPAZINE) 10 MG tablet, Take 10 mg by mouth. TAKE 1 TABLET (10 MG TOTAL) BY MOUTH EVERY 6 (SIX) HOURS AS NEEDED for HEADACHE AND NAUSEA, LIMIT USE TO 3 DAYS PER WEEK, Disp: , Rfl:    propranolol (INDERAL) 40 MG tablet, Take 1 tablet (40 mg total) by mouth 2 (two) times daily., Disp: 180 tablet, Rfl: 1   pyridostigmine (MESTINON) 60 MG tablet, TAKE 1 TABLET BY MOUTH 2 TIMES DAILY., Disp: 180 tablet, Rfl: 0   RABEprazole (ACIPHEX) 20 MG tablet, TAKE 1 TABLET BY MOUTH TWICE A DAY, Disp:  180 tablet, Rfl: 0   sucralfate (CARAFATE) 1 g tablet, Take 1 g by mouth 4 (four) times daily as needed. , Disp: , Rfl:    testosterone cypionate (DEPOTESTOSTERONE CYPIONATE) 200 MG/ML injection, Inject 80 mg into the muscle every 14 (fourteen) days., Disp: , Rfl:    valACYclovir (VALTREX) 1000 MG tablet, Take 2 tabs p.o. and repeat in 12 hours as needed for cold sore, Disp: 30 tablet, Rfl: 0  Past Medical History: Past Medical History:  Diagnosis Date   Anxiety    Per New Patient Packet   Chronic back pain    Per New Patient Packet   Chronic heart disease    Per New Patient Packet   Depression    GERD (gastroesophageal reflux disease)    Headache    History of CT scan of brain 09/14/2018   Per New Patient Packet   History of depression    Per New Patient Packet   History of gastritis    Per New Patient Packet   History of headache    Per New Patient Packet   History of kidney stones    Per New Patient Packet   History of neuropathy    Per New Patient Packet   Hypertension    Per New Patient Packet   Insomnia    Kidney stone    Lumbar radicular pain    Per New Patient Packet   Lyme disease    Per New Patient Packet   Myocardial infarction [facility])    Overactive bladder    PONV (postoperative nausea and vomiting)    Skin cancer, basal cell    Sleep trouble    Per New Patient Packet   Spinal cord stimulator status    01/08/21 - not currently using.   Squamous cell skin cancer    Substance abuse (HCC)     Tobacco Use: Social History   Tobacco Use  Smoking Status Never  Smokeless Tobacco Never    Labs: Review Flowsheet  More data may exist      Latest Ref Rng & Units 08/24/2019 10/17/2019 05/16/2020 11/11/2021  Labs for ITP Cardiac and Pulmonary Rehab  Cholestrol <200 mg/dL 168     - 170  170   LDL (calc) mg/dL (calc) 96     - 100  103   Direct LDL 0 - 99 mg/dL - - - -  HDL-C > OR = 40 mg/dL 48     - 56  48   Trlycerides <150 mg/dL 121     - 59  99   Hemoglobin  A1c - - 5.1     - -      01/30/2022  Labs for ITP Cardiac and Pulmonary Rehab  Cholestrol -  LDL (  calc) -  Direct LDL 98.5   HDL-C -  Trlycerides -  Hemoglobin A1c -    Details       This result is from an external source.          Exercise Target Goals: Exercise Program Goal: Individual exercise prescription set using results from initial 6 min walk test and THRR while considering  patient's activity barriers and safety.   Exercise Prescription Goal: Initial exercise prescription builds to 30-45 minutes a day of aerobic activity, 2-3 days per week.  Home exercise guidelines will be given to patient during program as part of exercise prescription that the participant will acknowledge.   Education: Aerobic Exercise: - Group verbal and visual presentation on the components of exercise prescription. Introduces F.I.T.T principle from ACSM for exercise prescriptions.  Reviews F.I.T.T. principles of aerobic exercise including progression. Written material given at graduation. Flowsheet Row Cardiac Rehab from 02/18/2022 in [facility] Cardiac and Pulmonary Rehab  Date 01/14/22  Educator Laurie  Instruction Review Code 1- Verbalizes Understanding       Education: Resistance Exercise: - Group verbal and visual presentation on the components of exercise prescription. Introduces F.I.T.T principle from ACSM for exercise prescriptions  Reviews F.I.T.T. principles of resistance exercise including progression. Written material given at graduation. Flowsheet Row Cardiac Rehab from 02/18/2022 in [facility] Cardiac and Pulmonary Rehab  Date 01/21/22  Educator [facility]  Instruction Review Code 1- Verbalizes Understanding        Education: Exercise & Equipment Safety: - Individual verbal instruction and demonstration of equipment use and safety with use of the equipment. Flowsheet Row Cardiac Rehab from 02/18/2022 in [facility] Cardiac and Pulmonary Rehab  Date 12/18/21  Educator AS  Instruction Review Code 1-  Verbalizes Understanding       Education: Exercise Physiology & General Exercise Guidelines: - Group verbal and written instruction with models to review the exercise physiology of the cardiovascular system and associated critical values. Provides general exercise guidelines with specific guidelines to those with heart or lung disease.  Flowsheet Row Cardiac Rehab from 02/18/2022 in [facility] Cardiac and Pulmonary Rehab  Education need identified 12/18/21  Date 01/07/22  Educator [facility]  Instruction Review Code 1- [employer] Understanding       Education: Flexibility, Balance, Mind/Body Relaxation: - Group verbal and visual presentation with interactive activity on the components of exercise prescription. Introduces F.I.T.T principle from ACSM for exercise prescriptions. Reviews F.I.T.T. principles of flexibility and balance exercise training including progression. Also discusses the mind body connection.  Reviews various relaxation techniques to help reduce and manage stress (i.e. Deep breathing, progressive muscle relaxation, and visualization). Balance handout provided to take home. Written material given at graduation. Flowsheet Row Cardiac Rehab from 02/18/2022 in [facility] Cardiac and Pulmonary Rehab  Date 01/28/22  Educator [facility]  Instruction Review Code 1- Verbalizes Understanding       Activity Barriers & Risk Stratification:  Activity Barriers & Cardiac Risk Stratification - 12/08/21 1408       Activity Barriers & Cardiac Risk Stratification   Activity Barriers Back Problems;Neck/Spine Problems;Other (comment);Deconditioning;Balance Concerns    Comments Spinal Cord Stimulator (left flank)- reports it doesn't work and alters his digestive system; peripheral neuropathy below knees (wide gait)    Cardiac Risk Stratification High             6 Minute Walk:  6 Minute Walk     Row Name 12/18/21 1614         6 Minute Walk   Phase Initial  Distance 805 feet     Walk Time 6 minutes      # of Rest Breaks 0     MPH 1.52     METS 1.97     RPE 12     Perceived Dyspnea  0     VO2 Peak 6.9     Symptoms Yes (comment)     Comments back pain 4/10 (chronic)     Resting HR 52 bpm     Resting BP 122/68     Resting Oxygen Saturation  97 %     Exercise Oxygen Saturation  during 6 min walk 100 %     Max Ex. HR 63 bpm     Max Ex. BP 124/66     2 Minute Post BP 118/68              Oxygen Initial Assessment:   Oxygen Re-Evaluation:   Oxygen Discharge (Final Oxygen Re-Evaluation):   Initial Exercise Prescription:  Initial Exercise Prescription - 12/18/21 1600       Date of Initial Exercise RX and Referring Provider   Date 12/18/21    Referring Provider Ellyn Hack      Oxygen   Oxygen Continuous    Maintain Oxygen Saturation 88% or higher      Treadmill   MPH 1.5    Grade 0    Minutes 15    METs 2.15      Recumbant Bike   Level 2    RPM 60    Minutes 15    METs 2      NuStep   Level 2    SPM 80    Minutes 15    METs 2      T5 Nustep   Level 1    SPM 80    Minutes 15    METs 2      Track   Laps 18    Minutes 15    METs 2      Prescription Details   Frequency (times per week) 3    Duration Progress to 30 minutes of continuous aerobic without signs/symptoms of physical distress      Intensity   THRR 40-80% of Max Heartrate 90-128    Ratings of Perceived Exertion 11-13    Perceived Dyspnea 0-4      Resistance Training   Training Prescription Yes    Weight 3 lb    Reps 10-15             Perform Capillary Blood Glucose checks as needed.  Exercise Prescription Changes:   Exercise Prescription Changes     Row Name 12/18/21 1600 12/24/21 1600 01/05/22 1600 01/20/22 0900 02/02/22 1400     Response to Exercise   Blood Pressure (Admit) 122/68 112/68 110/68 110/62 128/80   Blood Pressure (Exercise) 124/66 138/76 140/78 124/54 122/68   Blood Pressure (Exit) 118/68 104/68 108/66 118/62 118/58   Heart Rate (Admit) 52 bpm 63 bpm 67  bpm 61 bpm 66 bpm   Heart Rate (Exercise) 63 bpm 84 bpm 96 bpm 85 bpm 69 bpm   Heart Rate (Exit) 60 bpm 61 bpm 62 bpm 63 bpm 54 bpm   Oxygen Saturation (Admit) 97 % -- -- -- --   Oxygen Saturation (Exercise) 100 % -- -- -- --   Rating of Perceived Exertion (Exercise) '12 13 14 15 13   ' Perceived Dyspnea (Exercise) 0 -- -- -- --   Symptoms back pain 4/10 -- none none  none   Comments -- 2nd day -- -- --   Duration -- Progress to 30 minutes of  aerobic without signs/symptoms of physical distress Continue with 30 min of aerobic exercise without signs/symptoms of physical distress. Continue with 30 min of aerobic exercise without signs/symptoms of physical distress. Continue with 30 min of aerobic exercise without signs/symptoms of physical distress.   Intensity -- THRR unchanged THRR unchanged THRR unchanged THRR unchanged     Progression   Progression -- Continue to progress workloads to maintain intensity without signs/symptoms of physical distress. Continue to progress workloads to maintain intensity without signs/symptoms of physical distress. Continue to progress workloads to maintain intensity without signs/symptoms of physical distress. Continue to progress workloads to maintain intensity without signs/symptoms of physical distress.   Average METs -- 2 2.43 2.48 2.34     Resistance Training   Training Prescription -- Yes Yes Yes Yes   Weight -- 3 lb 4 lb 4 lb 4 lb   Reps -- 10-15 10-15 10-15 10-15     Interval Training   Interval Training -- -- No No No     Treadmill   MPH -- -- 2 -- --   Grade -- -- 0 -- --   Minutes -- -- 15 -- --   METs -- -- 2.53 -- --     Recumbant Bike   Level -- 2 3 1.1 3   Watts -- -- -- -- 22   Minutes -- '15 15 15 15   ' METs -- 2 3.21 3.23 3     NuStep   Level -- '1 4 6 5   ' Minutes -- '15 15 15 15   ' METs -- '2 2 3 ' 2.5     Recumbant Elliptical   Level -- -- -- 1 --   Minutes -- -- -- 15 --     REL-XR   Level -- -- -- -- 3   Minutes -- -- -- -- 15    METs -- -- -- -- 2.2     T5 Nustep   Level -- -- '3 3 3   ' Minutes -- -- '15 15 15   ' METs -- -- 2 2.6 1.8     Track   Laps -- -- -- 23 22   Minutes -- -- -- 15 15   METs -- -- -- 2.25 2.2     Oxygen   Maintain Oxygen Saturation -- -- 88% or higher 88% or higher 88% or higher    Row Name 02/11/22 1100 02/16/22 0800           Response to Exercise   Blood Pressure (Admit) -- 128/66      Blood Pressure (Exit) -- 120/66      Heart Rate (Admit) -- 57 bpm      Heart Rate (Exercise) -- 79 bpm      Heart Rate (Exit) -- 56 bpm      Rating of Perceived Exertion (Exercise) -- 14      Symptoms -- none      Duration -- Continue with 30 min of aerobic exercise without signs/symptoms of physical distress.      Intensity -- THRR unchanged        Progression   Progression -- Continue to progress workloads to maintain intensity without signs/symptoms of physical distress.      Average METs -- 2.18        Resistance Training   Training Prescription -- Yes      Weight --  4 lb      Reps -- 10-15        Interval Training   Interval Training -- No        Recumbant Bike   Level -- 3      Minutes -- 15      METs -- 3.24        NuStep   Level -- 6      Minutes -- 15      METs -- 2.6        REL-XR   Level -- 2      Minutes -- 15      METs -- 2        Track   Laps -- 27      Minutes -- 15      METs -- 2.47        Home Exercise Plan   Plans to continue exercise at [employer] (comment)  personal trainer and [place] (comment)  personal trainer and water aerobics      Frequency Add 2 additional days to program exercise sessions. Add 2 additional days to program exercise sessions.      Initial Home Exercises Provided 02/11/22 02/11/22        Oxygen   Maintain Oxygen Saturation -- 88% or higher               Exercise Comments:   Exercise Goals and Review:   Exercise Goals     Row Name 12/18/21 1634             Exercise Goals    Increase Physical Activity Yes       Intervention Provide advice, education, support and counseling about physical activity/exercise needs.;Develop an individualized exercise prescription for aerobic and resistive training based on initial evaluation findings, risk stratification, comorbidities and participant's personal goals.       Expected Outcomes Short Term: Attend rehab on a regular basis to increase amount of physical activity.;Long Term: Add in home exercise to make exercise part of routine and to increase amount of physical activity.;Long Term: Exercising regularly at least 3-5 days a week.       Increase Strength and Stamina Yes       Intervention Provide advice, education, support and counseling about physical activity/exercise needs.;Develop an individualized exercise prescription for aerobic and resistive training based on initial evaluation findings, risk stratification, comorbidities and participant's personal goals.       Expected Outcomes Short Term: Increase workloads from initial exercise prescription for resistance, speed, and METs.;Short Term: Perform resistance training exercises routinely during rehab and add in resistance training at home;Long Term: Improve cardiorespiratory fitness, muscular endurance and strength as measured by increased METs and functional capacity (6MWT)       Able to understand and use rate of perceived exertion (RPE) scale Yes       Intervention Provide education and explanation on how to use RPE scale       Expected Outcomes Short Term: Able to use RPE daily in rehab to express subjective intensity level;Long Term:  Able to use RPE to guide intensity level when exercising independently       Able to understand and use Dyspnea scale Yes       Intervention Provide education and explanation on how to use Dyspnea scale       Expected Outcomes Short Term: Able to use Dyspnea scale daily in rehab to express subjective sense of shortness of breath during  exertion;Long Term: Able to use Dyspnea scale to guide intensity level when exercising independently       Knowledge and understanding of Target Heart Rate Range (THRR) Yes       Intervention Provide education and explanation of THRR including how the numbers were predicted and where they are located for reference       Expected Outcomes Short Term: Able to state/look up THRR;Short Term: Able to use daily as guideline for intensity in rehab;Long Term: Able to use THRR to govern intensity when exercising independently       Able to check pulse independently Yes       Intervention Provide education and demonstration on how to check pulse in carotid and radial arteries.;Review the importance of being able to check your own pulse for safety during independent exercise       Expected Outcomes Short Term: Able to explain why pulse checking is important during independent exercise;Long Term: Able to check pulse independently and accurately       Understanding of Exercise Prescription Yes       Intervention Provide education, explanation, and written materials on patient's individual exercise prescription       Expected Outcomes Short Term: Able to explain program exercise prescription;Long Term: Able to explain home exercise prescription to exercise independently                Exercise Goals Re-Evaluation :  Exercise Goals Re-Evaluation     Row Name 12/22/21 1125 01/05/22 1613 01/20/22 0909 02/02/22 1419 02/11/22 1134     Exercise Goal Re-Evaluation   Exercise Goals Review Increase Physical Activity;Able to understand and use rate of perceived exertion (RPE) scale;Knowledge and understanding of Target Heart Rate Range (THRR);Understanding of Exercise Prescription;Increase Strength and Stamina Increase Physical Activity;Increase Strength and Stamina;Understanding of Exercise Prescription Increase Physical Activity;Increase Strength and Stamina;Understanding of Exercise Prescription Increase Physical  Activity;Increase Strength and Stamina;Understanding of Exercise Prescription Increase Physical Activity;Increase Strength and Stamina;Understanding of Exercise Prescription   Comments Reviewed RPE and dyspnea scales, THR and program prescription with pt today.  Pt voiced understanding and was given a copy of goals to take home. Grant Young is off to a good start in rehab.  He has already completed his 5 full exercise sessions.  He is already up to 4 lb hand weights too.  We will conitnue to monitor his progress. Grant Young is doing well in rehab. He has increased to level 6 on the T4 Nustep. He also has increased his laps to 23 on the track. He is not quite hitting his THRZ during exercise, however, his RPEs are in appropriate range. Will continue to monitor. Grant Young continues to do well in rehab.  He is up to level 3 on both T5 and bike.  We will conitnue to monitor his progress. Grant Young is doing well in rehab.  He is going to gym again and aquatic fitness class again.  He goes there on Tuesdays and Thursdays.  He is feeling stronger, just still fatigued from lack of sleep, but they are working on that. Reviewed home exercise with pt today.  Pt plans to walk and go to gym and water aerobics for exercise.  He works with a Physiological scientist at [employer].  Reviewed THR, pulse, RPE, sign and symptoms, pulse oximetery and when to call 911 or MD.  Also discussed weather considerations and indoor options.  Pt voiced understanding.   Expected Outcomes Short: Use RPE daily to regulate intensity. Long: Follow program prescription in  THR. Short: Conitnue to attend rehab regularly Long: Continue to follow program prescription Short: Continue to increase workload on recumbant bike Long: Continue to increase overall MET level Short: Increased other seated pieces to match level 5 from T4 NuStep Long: Continue to improve stamina Short: conitnue to get to gym off days Long: Continue to improve stamina    Row Name 02/16/22 0839              Exercise Goal Re-Evaluation   Exercise Goals Review Increase Physical Activity;Increase Strength and Stamina;Understanding of Exercise Prescription       Comments Grant Young continues to do well in rehab. He has reached a max of 27 laps on the track. He is also back up to level 6 on the T4 Nustep. He is not quite hitting his THR but RPEs are in appropriate range. Will continue to monitor.       Expected Outcomes Short: Continue to work up laps on track Long: Continue to increase overall MET level                Discharge Exercise Prescription (Final Exercise Prescription Changes):  Exercise Prescription Changes - 02/16/22 0800       Response to Exercise   Blood Pressure (Admit) 128/66    Blood Pressure (Exit) 120/66    Heart Rate (Admit) 57 bpm    Heart Rate (Exercise) 79 bpm    Heart Rate (Exit) 56 bpm    Rating of Perceived Exertion (Exercise) 14    Symptoms none    Duration Continue with 30 min of aerobic exercise without signs/symptoms of physical distress.    Intensity THRR unchanged      Progression   Progression Continue to progress workloads to maintain intensity without signs/symptoms of physical distress.    Average METs 2.18      Resistance Training   Training Prescription Yes    Weight 4 lb    Reps 10-15      Interval Training   Interval Training No      Recumbant Bike   Level 3    Minutes 15    METs 3.24      NuStep   Level 6    Minutes 15    METs 2.6      REL-XR   Level 2    Minutes 15    METs 2      Track   Laps 27    Minutes 15    METs 2.47      Home Exercise Plan   Plans to continue exercise at [employer] (comment)   personal trainer and water aerobics   Frequency Add 2 additional days to program exercise sessions.    Initial Home Exercises Provided 02/11/22      Oxygen   Maintain Oxygen Saturation 88% or higher             Nutrition:  Target Goals: Understanding of nutrition guidelines, daily intake of sodium  <1548m, cholesterol <2079m calories 30% from fat and 7% or less from saturated fats, daily to have 5 or more servings of fruits and vegetables.  Education: All About Nutrition: -Group instruction provided by verbal, written material, interactive activities, discussions, models, and posters to present general guidelines for heart healthy nutrition including fat, fiber, MyPlate, the role of sodium in heart healthy nutrition, utilization of the nutrition label, and utilization of this knowledge for meal planning. Follow up email sent as well. Written material given at graduation. [facility]  from [facility]18/2022 in [facility] Cardiac and Pulmonary Rehab  Education need identified 12/18/21  Date 02/04/22  Educator [place]  Instruction Review Code 1- Verbalizes Understanding       Biometrics:  Pre Biometrics - 12/18/21 1635       Pre Biometrics   Height '5\' 9"'  (1.753 m)    Weight 141 lb 9.6 oz (64.2 kg)    BMI (Calculated) 20.9    Single Leg Stand 2.88 seconds              Nutrition Therapy Plan and Nutrition Goals:  Nutrition Therapy & Goals - 01/12/22 1034       Nutrition Therapy   Diet Heart healthy, low Na    Protein (specify units) 50g    Fiber 30 grams    Whole Grain Foods 3 servings    Saturated Fats 16 max. grams    Fruits and Vegetables 8 servings/day    Sodium 2 grams      Personal Nutrition Goals   Nutrition Goal ST: eat carbohydrate richs fruits and vegetables at each meal daily LT: meet nutritional needs (vitamins, minerals, macronutrients, calories)    Comments 76 y.o. M admitted to cardiac rehab s/p NTEMI. PMHx HTN, CAD, GERD, HLD, chronic pain symdrome, tremor, chronic migraine, HFpEF. PSHx cholecystectomy.  Relevant medications include  amitriptyline, anastrozole, chlorthalidone, vit B12, Vit K1 and K2, antioxidnat supplement, DHEA, valium, dicyclomine, digestive aids mixture, lexapro, imodium, magnesium, melatonin, zofran, miralax, probiotic, prochlorperazine,  rabeprazole, sucralfate, testosterone cypionate. PYP Score: 77. Vegetables & Fruits 8/12. Breads, Grains & Cereals 8/12. Red & Processed Meat 10/12. Poultry 2/2. Fish & Shellfish 1/4. Beans, Nuts & Seeds 2/4. Milk & Dairy Foods 5/6. Toppings, Oils, Seasonings & Salt 18/20. Sweets, Snacks & Restaurant Food 13/14. Beverages 10/10.  "low inflammatory diet, detox diet" This plan includes a shake as well as pills. He will eat meat for each meal with lots of vegetables and fruit. No added sugar, no coffee, no dairy, no grains, no nuts, no processed foods. His wife will cook with olive oil and use it for salads. He will have fruit 1x/day and sweet potatoes 1x/week.  This particular plan is intended for 21 days. Pt reports that his doctor has recommended this diet for neuropathy. They have cauliflower, broccoli, celery, cucmbers, salad greens, asaparagus. They eat chicken, grass-fed beef occasionally, salmon. Cautioned against the restrictive nature of this diet. Discussed how despite getting enough protein, if calorie needs are not met, he could still lose muscle tissue. Discussed the importance of calories and carbohydrates for meeting nutritional needs as well as energy. They would like to stay gluten-free after this diet, suggested including gluten-free whole grains such as oats and quinoa. Suggested adding back in nuts and seeds as well after they finish the diet as they would like to continue with this diet plan. Suggested adding carbohydrates allowed in this diet to his meals daily. Reviewed general heart healthy eating.      Intervention Plan   Intervention Prescribe, educate and counsel regarding individualized specific dietary modifications aiming towards targeted core components such as weight, hypertension, lipid management, diabetes, heart failure and other comorbidities.;Nutrition handout(s) given to patient.    Expected Outcomes Short Term Goal: Understand basic principles of dietary content, such as  calories, fat, sodium, cholesterol and nutrients.;Short Term Goal: A plan has been developed with personal nutrition goals set during dietitian appointment.;Long Term Goal: Adherence to prescribed nutrition plan.             Nutrition  Assessments:  MEDIFICTS Score Key: ?70 Need to make dietary changes  40-70 Heart Healthy Diet ? 40 Therapeutic Level Cholesterol Diet  Flowsheet Row Cardiac Rehab from 12/18/2021 in [facility] Cardiac and Pulmonary Rehab  Picture Your Plate Total Score on Admission 77      Picture Your Plate Scores: <92 Unhealthy dietary pattern with much room for improvement. 41-50 Dietary pattern unlikely to meet recommendations for good health and room for improvement. 51-60 More healthful dietary pattern, with some room for improvement.  >60 Healthy dietary pattern, although there may be some specific behaviors that could be improved.    Nutrition Goals Re-Evaluation:  Nutrition Goals Re-Evaluation     [place] Name 02/11/22 1131             Goals   Nutrition Goal ST: eat carbohydrate richs fruits and vegetables at each meal daily LT: meet nutritional needs (vitamins, minerals, macronutrients, calories)       Comment Grant Young is doing well in rehab.  He is doing well with his diet.  He and his wife are watching what they eat closely.  When he met with Melissa, he was encouraged to eat fruit/veggies with each meal.  They have been doing better with this and are looking forward to things coming into season now.  They have been eating a lot of strawberries recently.  They aim to get a good variety.       Expected Outcome Short: Continue to add in more fruit and vegetables Long: Continue to follow heart healthy diet.                Nutrition Goals Discharge (Final Nutrition Goals Re-Evaluation):  Nutrition Goals Re-Evaluation - 02/11/22 1131       Goals   Nutrition Goal ST: eat carbohydrate richs fruits and vegetables at each meal daily LT: meet nutritional needs  (vitamins, minerals, macronutrients, calories)    Comment Grant Young is doing well in rehab.  He is doing well with his diet.  He and his wife are watching what they eat closely.  When he met with Melissa, he was encouraged to eat fruit/veggies with each meal.  They have been doing better with this and are looking forward to things coming into season now.  They have been eating a lot of strawberries recently.  They aim to get a good variety.    Expected Outcome Short: Continue to add in more fruit and vegetables Long: Continue to follow heart healthy diet.             Psychosocial: Target Goals: Acknowledge presence or absence of significant depression and/or stress, maximize coping skills, provide positive support system. Participant is able to verbalize types and ability to use techniques and skills needed for reducing stress and depression.   Education: Stress, Anxiety, and Depression - Group verbal and visual presentation to define topics covered.  Reviews how body is impacted by stress, anxiety, and depression.  Also discusses healthy ways to reduce stress and to treat/manage anxiety and depression.  Written material given at graduation. Flowsheet Row Cardiac Rehab from 02/18/2022 in [facility] Cardiac and Pulmonary Rehab  Date 12/31/21  Educator [facility]  Instruction Review Code 1- [employer] Understanding       Education: Sleep Hygiene -Provides group verbal and written instruction about how sleep can affect your health.  Define sleep hygiene, discuss sleep cycles and impact of sleep habits. Review good sleep hygiene tips.    Initial Review & Psychosocial Screening:  Initial Psych Review & Screening -  12/08/21 1429       Initial Review   Current issues with Current Anxiety/Panic;Current Stress Concerns;Current Sleep Concerns    Source of Stress Concerns Chronic Illness      Family Dynamics   Good Support System? Yes   wife     Barriers   Psychosocial barriers to participate in program  There are no identifiable barriers or psychosocial needs.;The patient should benefit from training in stress management and relaxation.      Screening Interventions   Interventions Encouraged to exercise;Provide feedback about the scores to participant;To provide support and resources with identified psychosocial needs    Expected Outcomes Short Term goal: Utilizing psychosocial counselor, staff and physician to assist with identification of specific Stressors or current issues interfering with healing process. Setting desired goal for each stressor or current issue identified.;Long Term Goal: Stressors or current issues are controlled or eliminated.;Short Term goal: Identification and review with participant of any Quality of Life or Depression concerns found by scoring the questionnaire.;Long Term goal: The participant improves quality of Life and PHQ9 Scores as seen by post scores and/or verbalization of changes             Quality of Life Scores:   Quality of Life - 12/18/21 1632       Quality of Life   Select Quality of Life      Quality of Life Scores   Health/Function Pre 15.13 %    Socioeconomic Pre 20.71 %    Psych/Spiritual Pre 18.57 %    Family Pre 20.5 %    GLOBAL Pre 17.78 %            Scores of 19 and below usually indicate a poorer quality of life in these areas.  A difference of  2-3 points is a clinically meaningful difference.  A difference of 2-3 points in the total score of the Quality of Life Index has been associated with significant improvement in overall quality of life, self-image, physical symptoms, and general health in studies assessing change in quality of life.  PHQ-9: Review Flowsheet  More data exists      02/11/2022 01/16/2022 12/18/2021 12/16/2021 11/27/2021  Depression screen PHQ 2/9  Decreased Interest '1 2 3 2 ' 0  Down, Depressed, Hopeless '1 1 1 1 ' 0  PHQ - 2 Score '2 3 4 3 ' 0  Altered sleeping '3 3 3 3 ' -  Tired, decreased energy '3 3 3 3 ' -  Change in  appetite 0 0 0 0 -  Feeling bad or failure about yourself  0 0 0 0 -  Trouble concentrating '1 2 2 1 ' -  Moving slowly or fidgety/restless '1 1 2 3 ' -  Suicidal thoughts 0 0 0 0 -  PHQ-9 Score '10 12 14 13 ' -  Difficult doing work/chores Somewhat difficult Somewhat difficult Somewhat difficult Somewhat difficult -   Interpretation of Total Score  Total Score Depression Severity:  1-4 = Minimal depression, 5-9 = Mild depression, 10-14 = Moderate depression, 15-19 = Moderately severe depression, 20-27 = Severe depression   Psychosocial Evaluation and Intervention:  Psychosocial Evaluation - 12/08/21 1429       Psychosocial Evaluation & Interventions   Interventions Encouraged to exercise with the program and follow exercise prescription;Stress management education;Relaxation education    Comments Grant Young is coming to cardiac rehab post NSTEMI and stent. He states he has felt more tired and his brain feels foggy after his MI. Staff encouraged him to talk to his PCP  at his appointment next week. His medical history complicates narrowing down specific causes for his symptoms. He wants to get back to his exercise routine at [facility] and thinks this program will help him get there. His biggest complaint is his chronic back pain for which he has a spinal cord stimulator that is currently off because he felt it was not working and it was causing GI issues. His MD is aware of this. He also has peripheral neuropathy that causes him to use a wide gait. He is hopeful that he can find exercise equipment that will work for him. He has a history of anxiety and depression, which are currently being managed by medication and lifestyle habits.    Expected Outcomes Short: attend cardiac rehab for education and exercise. Long; Develop and maintain positive self care habits.    Continue Psychosocial Services  Follow up required by staff             Psychosocial Re-Evaluation:  Psychosocial Re-Evaluation      [place] Name 01/16/22 1130 02/11/22 1126           Psychosocial Re-Evaluation   Current issues with Current Stress Concerns;Current Psychotropic Meds Current Stress Concerns;Current Psychotropic Meds;Current Sleep Concerns      Comments Reviewed patient health questionnaire (PHQ-9) with patient for follow up. Previously, patients score indicated signs/symptoms of depression.  Reviewed to see if patient is improving symptom wise while in program.  Score improved and patient states that it is because he has been a little more social. He feels tired and worn out at times and is not able to go out. He does not like that he is not able to be social at times because he does not feel up to it. Grant Young is doing well in rehab.  His score improved by 2 pts.  He is still struggling with sleep and thus fatigue.  He has been napping and lacking energy a lot.  He had a sleep study last week and gets the results this Friday afternoon.  He will let us know what they say about his sleep habits.  He continues to enjoy getting out when able but easily frustrated with not being able to do and do as he would like to do.  His mood is a little more stable and feeling better overall.      Expected Outcomes Short: Continue to attend [facility] regularly for regular exercise and social engagement. Long: Continue to improve symptoms and manage a positive mental state. Short: Follow up on sleep study Long: Continue to focus on positive      Interventions Encouraged to attend Cardiac Rehabilitation for the exercise Encouraged to attend Cardiac Rehabilitation for the exercise;Stress management education      Continue Psychosocial Services  Follow up required by staff Follow up required by staff               Psychosocial Discharge (Final Psychosocial Re-Evaluation):  Psychosocial Re-Evaluation - 02/11/22 1126       Psychosocial Re-Evaluation   Current issues with Current Stress Concerns;Current Psychotropic Meds;Current Sleep  Concerns    Comments Grant Young is doing well in rehab.  His score improved by 2 pts.  He is still struggling with sleep and thus fatigue.  He has been napping and lacking energy a lot.  He had a sleep study last week and gets the results this Friday afternoon.  He will let us know what they say about his sleep habits.  He continues to  enjoy getting out when able but easily frustrated with not being able to do and do as he would like to do.  His mood is a little more stable and feeling better overall.    Expected Outcomes Short: Follow up on sleep study Long: Continue to focus on positive    Interventions Encouraged to attend Cardiac Rehabilitation for the exercise;Stress management education    Continue Psychosocial Services  Follow up required by staff             Vocational Rehabilitation: Provide vocational rehab assistance to qualifying candidates.   Vocational Rehab Evaluation & Intervention:  Vocational Rehab - 12/08/21 1429       Initial Vocational Rehab Evaluation & Intervention   Assessment shows need for Vocational Rehabilitation No             Education: Education Goals: Education classes will be provided on a variety of topics geared toward better understanding of heart health and risk factor modification. Participant will state understanding/return demonstration of topics presented as noted by education test scores.  Learning Barriers/Preferences:  Learning Barriers/Preferences - 12/08/21 1429       Learning Barriers/Preferences   Learning Barriers None    Learning Preferences Individual Instruction             General Cardiac Education Topics:  AED/CPR: - Group verbal and written instruction with the use of models to demonstrate the basic use of the AED with the basic ABC's of resuscitation.   Anatomy and Cardiac Procedures: - Group verbal and visual presentation and models provide information about basic cardiac anatomy and function. Reviews the  testing methods done to diagnose heart disease and the outcomes of the test results. Describes the treatment choices: Medical Management, Angioplasty, or Coronary Bypass Surgery for treating various heart conditions including Myocardial Infarction, Angina, Valve Disease, and Cardiac Arrhythmias.  Written material given at graduation. Flowsheet Row Cardiac Rehab from 02/18/2022 in [facility] Cardiac and Pulmonary Rehab  Date 01/21/22  Educator SB  Instruction Review Code 1- Verbalizes Understanding       Medication Safety: - Group verbal and visual instruction to review commonly prescribed medications for heart and lung disease. Reviews the medication, class of the drug, and side effects. Includes the steps to properly store meds and maintain the prescription regimen.  Written material given at graduation. Flowsheet Row Cardiac Rehab from 02/18/2022 in [facility] Cardiac and Pulmonary Rehab  Date 02/11/22  Educator SB  Instruction Review Code 1- Verbalizes Understanding       Intimacy: - Group verbal instruction through game format to discuss how heart and lung disease can affect sexual intimacy. Written material given at graduation.. Flowsheet Row Cardiac Rehab from 02/18/2022 in [facility] Cardiac and Pulmonary Rehab  Date 01/14/22  Educator [facility]  Instruction Review Code 1- Verbalizes Understanding       Know Your Numbers and Heart Failure: - Group verbal and visual instruction to discuss disease risk factors for cardiac and pulmonary disease and treatment options.  Reviews associated critical values for Overweight/Obesity, Hypertension, Cholesterol, and Diabetes.  Discusses basics of heart failure: signs/symptoms and treatments.  Introduces Heart Failure Zone chart for action plan for heart failure.  Written material given at graduation. Flowsheet Row Cardiac Rehab from 02/18/2022 in [facility] Cardiac and Pulmonary Rehab  Date 02/18/22  Educator SB  Instruction Review Code 1- Verbalizes Understanding        Infection Prevention: - Provides verbal and written material to individual with discussion of infection control including proper hand  washing and proper equipment cleaning during exercise session. Flowsheet Row Cardiac Rehab from 02/18/2022 in [facility] Cardiac and Pulmonary Rehab  Date 12/18/21  Educator AS  Instruction Review Code 1- Verbalizes Understanding       Falls Prevention: - Provides verbal and written material to individual with discussion of falls prevention and safety. Flowsheet Row Cardiac Rehab from 02/18/2022 in [facility] Cardiac and Pulmonary Rehab  Date 12/18/21  Educator AS  Instruction Review Code 1- Verbalizes Understanding       Other: -Provides group and verbal instruction on various topics (see comments)   Knowledge Questionnaire Score:  Knowledge Questionnaire Score - 12/18/21 1627       Knowledge Questionnaire Score   Pre Score 23/26             Core Components/Risk Factors/Patient Goals at Admission:  Personal Goals and Risk Factors at Admission - 12/08/21 1428       Core Components/Risk Factors/Patient Goals on Admission   Hypertension Yes    Intervention Provide education on lifestyle modifcations including regular physical activity/exercise, weight management, moderate sodium restriction and increased consumption of fresh fruit, vegetables, and low fat dairy, alcohol moderation, and smoking cessation.;Monitor prescription use compliance.    Expected Outcomes Short Term: Continued assessment and intervention until BP is < 140/4m HG in hypertensive participants. < 130/832mHG in hypertensive participants with diabetes, heart failure or chronic kidney disease.;Long Term: Maintenance of blood pressure at goal levels.    Lipids Yes    Intervention Provide education and support for participant on nutrition & aerobic/resistive exercise along with prescribed medications to achieve LDL <7055mHDL >19m47m  Expected Outcomes Short Term: Participant states  understanding of desired cholesterol values and is compliant with medications prescribed. Participant is following exercise prescription and nutrition guidelines.;Long Term: Cholesterol controlled with medications as prescribed, with individualized exercise RX and with personalized nutrition plan. Value goals: LDL < 70mg8mL > 40 mg.             Education:Diabetes - Individual verbal and written instruction to review signs/symptoms of diabetes, desired ranges of glucose level fasting, after meals and with exercise. Acknowledge that pre and post exercise glucose checks will be done for 3 sessions at entry of program.   Core Components/Risk Factors/Patient Goals Review:   Goals and Risk Factor Review     Row Name 01/16/22 1133 02/11/22 1129           Core Components/Risk Factors/Patient Goals Review   Personal Goals Review Weight Management/Obesity;Hypertension Weight Management/Obesity;Hypertension      Review [place] to gain some muslce and gain some weight. His blood pressure has been doing well in the program and within normal limits. He would like to intake more protien to gain some weight. Informed patient of certain protiens that can be good for him. He has no food restrictions. ClaytEsmeraldaoing well in rehab.  He was having a hard time with his Brillinta and was recently switched to plavix.  So far, he is feeling better.  He had a sleep study last week and will get results this Friday.  His pressuers are doing well and staying steady in the 110s-130s.   He is doing well with his weight and staying steady.      Expected Outcomes Short: increase protien intake. Long: maintain a diet that adheres to him. Short: Continue to adjust to new medicine and watch for side effects Long: COnitnue to monitor risk factors.  Core Components/Risk Factors/Patient Goals at Discharge (Final Review):   Goals and Risk Factor Review - 02/11/22 1129       Core Components/Risk  Factors/Patient Goals Review   Personal Goals Review Weight Management/Obesity;Hypertension    Review Grant Young is doing well in rehab.  He was having a hard time with his Brillinta and was recently switched to plavix.  So far, he is feeling better.  He had a sleep study last week and will get results this Friday.  His pressuers are doing well and staying steady in the 110s-130s.   He is doing well with his weight and staying steady.    Expected Outcomes Short: Continue to adjust to new medicine and watch for side effects Long: COnitnue to monitor risk factors.             ITP Comments:  ITP Comments     Row Name 12/08/21 1406 12/18/21 1644 12/22/21 1123 12/31/21 1311 01/12/22 1121   ITP Comments Initial telephone orientation completed. Diagnosis can be found in [facility] 3/16. EP Orientation scheduled for Thursday 4/6 at 2pm. Completed 6MWT and gym orientation. Initial ITP created and sent for review to Dr. Emily Filbert, Medical Director. First full day of exercise!  Patient was oriented to gym and equipment including functions, settings, policies, and procedures.  Patient's individual exercise prescription and treatment plan were reviewed.  All starting workloads were established based on the results of the 6 minute walk test done at initial orientation visit.  The plan for exercise progression was also introduced and progression will be customized based on patient's performance and goals. 30 Day review completed. Medical Director ITP review done, changes made as directed, and signed approval by Medical Director. Completed initial RD consultation    [place] Name 01/28/22 0828 02/25/22 1140         ITP Comments 30 Day review completed. Medical Director ITP review done, changes made as directed, and signed approval by Medical Director. 30 Day review completed. Medical Director ITP review done, changes made as directed, and signed approval by Medical Director.               Comments:

## 2022-02-26 ENCOUNTER — Other Ambulatory Visit: Payer: Self-pay | Admitting: Cardiology

## 2022-03-02 ENCOUNTER — Encounter: Payer: Medicare Other | Admitting: *Deleted

## 2022-03-02 VITALS — Ht 73.0 in | Wt 141.3 lb

## 2022-03-02 DIAGNOSIS — I214 Non-ST elevation (NSTEMI) myocardial infarction: Secondary | ICD-10-CM

## 2022-03-02 DIAGNOSIS — Z955 Presence of coronary angioplasty implant and graft: Secondary | ICD-10-CM

## 2022-03-02 NOTE — Progress Notes (Signed)
Daily Session Note  Patient Details  Name: Grant Young MRN: [id number] Date of Birth: 1945/11/26 Referring Provider:   Flowsheet Row Cardiac Rehab from 12/18/2021 in [facility] Cardiac and Pulmonary Rehab  Referring Provider Ellyn Hack       Encounter Date: 03/02/2022  Check In:  Session Check In - 03/02/22 1128       Check-In   Supervising physician immediately available to respond to emergencies See telemetry face sheet for immediately available ER MD    Location [facility]-Cardiac & Pulmonary Rehab    Staff Present Heath Lark, RN, BSN, Laveda Norman, BS, ACSM CEP, Exercise Physiologist;Kelly Rosalia Hammers, MPA, RN    Virtual Visit No    Medication changes reported     No    Fall or balance concerns reported    No    Warm-up and Cool-down Performed on first and last piece of equipment    Resistance Training Performed Yes    VAD Patient? No    PAD/SET Patient? No      Pain Assessment   Currently in Pain? No/denies              6 Minute Walk     Row Name 12/18/21 1614 03/02/22 1129       6 Minute Walk   Phase Initial Discharge    Distance 805 feet 1240 feet    Distance % Change -- 54 %    Distance Feet Change -- 435 ft    Walk Time 6 minutes 6 minutes    # of Rest Breaks 0 0    MPH 1.52 2.35    METS 1.97 2.99    RPE 12 11    Perceived Dyspnea  0 1    VO2 Peak 6.9 10.45    Symptoms Yes (comment) No    Comments back pain 4/10 (chronic) --    Resting HR 52 bpm 50 bpm    Resting BP 122/68 122/62    Resting Oxygen Saturation  97 % 97 %    Exercise Oxygen Saturation  during 6 min walk 100 % 92 %    Max Ex. HR 63 bpm 68 bpm    Max Ex. BP 124/66 124/72    2 Minute Post BP 118/68 120/74               Social History   Tobacco Use  Smoking Status Never  Smokeless Tobacco Never    Goals Met:  Independence with exercise equipment Exercise tolerated well No report of concerns or symptoms today  Goals Unmet:  Not Applicable  Comments: Pt able to follow  exercise prescription today without complaint.  Will continue to monitor for progression.  [name] was out last week with sinus problems. He was negative for COVID    [place] Name 12/18/21 1614 03/02/22 1129       6 Minute Walk   Phase Initial Discharge    Distance 805 feet 1240 feet    Distance % Change -- 54 %    Distance Feet Change -- 435 ft    Walk Time 6 minutes 6 minutes    # of Rest Breaks 0 0    MPH 1.52 2.35    METS 1.97 2.99    RPE 12 11    Perceived Dyspnea  0 1    VO2 Peak 6.9 10.45    Symptoms Yes (comment) No    Comments back pain 4/10 (chronic) --    Resting  HR 52 bpm 50 bpm    Resting BP 122/68 122/62    Resting Oxygen Saturation  97 % 97 %    Exercise Oxygen Saturation  during 6 min walk 100 % 92 %    Max Ex. HR 63 bpm 68 bpm    Max Ex. BP 124/66 124/72    2 Minute Post BP 118/68 120/74                Dr. Emily Filbert is Medical Director for [place].  Dr. Ottie Glazier is Medical Director for [facility].

## 2022-03-02 NOTE — Patient Instructions (Signed)
Discharge Patient Instructions  Patient Details  Name: Grant Young MRN: [id number] Date of Birth: January 10, 1946 Referring Provider:  [name], NP   Number of Visits: 36  Reason for Discharge:  Patient reached a stable level of exercise. Patient independent in their exercise. Patient has met program and personal goals.  Smoking History:  Social History   Tobacco Use  Smoking Status Never  Smokeless Tobacco Never    Diagnosis:  NSTEMI (non-ST elevated myocardial infarction) ([place])  Status post coronary artery stent placement  Initial Exercise Prescription:  Initial Exercise Prescription - 12/18/21 1600       Date of Initial Exercise RX and Referring Provider   Date 12/18/21    Referring Provider Ellyn Hack      Oxygen   Oxygen Continuous    Maintain Oxygen Saturation 88% or higher      Treadmill   MPH 1.5    Grade 0    Minutes 15    METs 2.15      Recumbant Bike   Level 2    RPM 60    Minutes 15    METs 2      NuStep   Level 2    SPM 80    Minutes 15    METs 2      T5 Nustep   Level 1    SPM 80    Minutes 15    METs 2      Track   Laps 18    Minutes 15    METs 2      Prescription Details   Frequency (times per week) 3    Duration Progress to 30 minutes of continuous aerobic without signs/symptoms of physical distress      Intensity   THRR 40-80% of Max Heartrate 90-128    Ratings of Perceived Exertion 11-13    Perceived Dyspnea 0-4      Resistance Training   Training Prescription Yes    Weight 3 lb    Reps 10-15             Discharge Exercise Prescription (Final Exercise Prescription Changes):  Exercise Prescription Changes - 03/02/22 1200       Response to Exercise   Blood Pressure (Admit) 122/62    Blood Pressure (Exit) 114/58    Heart Rate (Admit) 55 bpm    Heart Rate (Exercise) 80 bpm    Heart Rate (Exit) 57 bpm    Rating of Perceived Exertion (Exercise) 13    Symptoms none    Duration Continue with 30 min of  aerobic exercise without signs/symptoms of physical distress.    Intensity THRR unchanged      Progression   Progression Continue to progress workloads to maintain intensity without signs/symptoms of physical distress.    Average METs 2.65      Resistance Training   Training Prescription Yes    Weight 4 lb    Reps 10-15      Interval Training   Interval Training No      Recumbant Bike   Level 5    Watts 33    Minutes 15    METs 3.23      NuStep   Level 6    Minutes 15    METs 2.6      REL-XR   Level 6    Minutes 15    METs 2.8      T5 Nustep   Level 4    Minutes 15  METs 1.9      Home Exercise Plan   Plans to continue exercise at [facility] (comment)   personal trainer and water aerobics   Frequency Add 2 additional days to program exercise sessions.    Initial Home Exercises Provided 02/11/22      Oxygen   Maintain Oxygen Saturation 88% or higher             Functional Capacity:  6 Minute Walk     Row Name 12/18/21 1614 03/02/22 1129       6 Minute Walk   Phase Initial Discharge    Distance 805 feet 1240 feet    Distance % Change -- 54 %    Distance Feet Change -- 435 ft    Walk Time 6 minutes 6 minutes    # of Rest Breaks 0 0    MPH 1.52 2.35    METS 1.97 2.99    RPE 12 11    Perceived Dyspnea  0 1    VO2 Peak 6.9 10.45    Symptoms Yes (comment) No    Comments back pain 4/10 (chronic) --    Resting HR 52 bpm 50 bpm    Resting BP 122/68 122/62    Resting Oxygen Saturation  97 % 97 %    Exercise Oxygen Saturation  during 6 min walk 100 % 92 %    Max Ex. HR 63 bpm 68 bpm    Max Ex. BP 124/66 124/72    2 Minute Post BP 118/68 120/74              Nutrition & Weight - Outcomes:  Pre Biometrics - 12/18/21 1635       Pre Biometrics   Height '5\' 9"'  (1.753 m)    Weight 141 lb 9.6 oz (64.2 kg)    BMI (Calculated) 20.9    Single Leg Stand 2.88 seconds             Post Biometrics - 03/02/22 1131        Post   Biometrics   Height '6\' 1"'  (1.854 m)    Weight 141 lb 4.8 oz (64.1 kg)    BMI (Calculated) 18.65    Single Leg Stand 5.09 seconds             Nutrition:  Nutrition Therapy & Goals - 01/12/22 1034       Nutrition Therapy   Diet Heart healthy, low Na    Protein (specify units) 50g    Fiber 30 grams    Whole Grain Foods 3 servings    Saturated Fats 16 max. grams    Fruits and Vegetables 8 servings/day    Sodium 2 grams      Personal Nutrition Goals   Nutrition Goal ST: eat carbohydrate richs fruits and vegetables at each meal daily LT: meet nutritional needs (vitamins, minerals, macronutrients, calories)    Comments 76 y.o. M admitted to cardiac rehab s/p NTEMI. PMHx HTN, CAD, GERD, HLD, chronic pain symdrome, tremor, chronic migraine, HFpEF. PSHx cholecystectomy.  Relevant medications include  amitriptyline, anastrozole, chlorthalidone, vit B12, Vit K1 and K2, antioxidnat supplement, DHEA, valium, dicyclomine, digestive aids mixture, lexapro, imodium, magnesium, melatonin, zofran, miralax, probiotic, prochlorperazine, rabeprazole, sucralfate, testosterone cypionate. PYP Score: 77. Vegetables & Fruits 8/12. Breads, Grains & Cereals 8/12. Red & Processed Meat 10/12. Poultry 2/2. Fish & Shellfish 1/4. Beans, Nuts & Seeds 2/4. Milk & Dairy Foods 5/6. Toppings, Oils, Seasonings & Salt 18/20. Sweets, Snacks & Restaurant  Food 13/14. Beverages 10/10.  "low inflammatory diet, detox diet" This plan includes a shake as well as pills. He will eat meat for each meal with lots of vegetables and fruit. No added sugar, no coffee, no dairy, no grains, no nuts, no processed foods. His wife will cook with olive oil and use it for salads. He will have fruit 1x/day and sweet potatoes 1x/week.  This particular plan is intended for 21 days. Pt reports that his doctor has recommended this diet for neuropathy. They have cauliflower, broccoli, celery, cucmbers, salad greens, asaparagus. They eat chicken, grass-fed  beef occasionally, salmon. Cautioned against the restrictive nature of this diet. Discussed how despite getting enough protein, if calorie needs are not met, he could still lose muscle tissue. Discussed the importance of calories and carbohydrates for meeting nutritional needs as well as energy. They would like to stay gluten-free after this diet, suggested including gluten-free whole grains such as oats and quinoa. Suggested adding back in nuts and seeds as well after they finish the diet as they would like to continue with this diet plan. Suggested adding carbohydrates allowed in this diet to his meals daily. Reviewed general heart healthy eating.      Intervention Plan   Intervention Prescribe, educate and counsel regarding individualized specific dietary modifications aiming towards targeted core components such as weight, hypertension, lipid management, diabetes, heart failure and other comorbidities.;Nutrition handout(s) given to patient.    Expected Outcomes Short Term Goal: Understand basic principles of dietary content, such as calories, fat, sodium, cholesterol and nutrients.;Short Term Goal: A plan has been developed with personal nutrition goals set during dietitian appointment.;Long Term Goal: Adherence to prescribed nutrition plan.              Goals reviewed with patient; copy given to patient.

## 2022-03-04 ENCOUNTER — Other Ambulatory Visit: Payer: Self-pay | Admitting: Internal Medicine

## 2022-03-04 DIAGNOSIS — I214 Non-ST elevation (NSTEMI) myocardial infarction: Secondary | ICD-10-CM

## 2022-03-04 DIAGNOSIS — Z955 Presence of coronary angioplasty implant and graft: Secondary | ICD-10-CM

## 2022-03-04 NOTE — Progress Notes (Signed)
Daily Session Note  Patient Details  Name: Grant Young MRN: [id number] Date of Birth: 02-05-1946 Referring Provider:   Flowsheet Row Cardiac Rehab from 12/18/2021 in [facility] Cardiac and Pulmonary Rehab  Referring Provider Ellyn Hack       Encounter Date: 03/04/2022  Check In:  Session Check In - 03/04/22 1117       Check-In   Supervising physician immediately available to respond to emergencies See telemetry face sheet for immediately available ER MD    Location [facility]-Cardiac & Pulmonary Rehab    Staff Present Carson Myrtle, BS, RRT, CPFT;Kristen Coble, RN,BC,MSN;Joseph Karie Fetch, MPA, RN    Virtual Visit No    Medication changes reported     No    Fall or balance concerns reported    No    Warm-up and Cool-down Performed on first and last piece of equipment    Resistance Training Performed Yes    VAD Patient? No    PAD/SET Patient? No      Pain Assessment   Currently in Pain? No/denies                Social History   Tobacco Use  Smoking Status Never  Smokeless Tobacco Never    Goals Met:  Independence with exercise equipment Exercise tolerated well No report of concerns or symptoms today Strength training completed today  Goals Unmet:  Not Applicable  Comments: Pt able to follow exercise prescription today without complaint.  Will continue to monitor for progression.    Dr. Emily Filbert is Medical Director for [place].  Dr. Ottie Glazier is Medical Director for [facility] Pulmonary Rehabilitation.

## 2022-03-04 NOTE — Telephone Encounter (Signed)
Requested medication (s) are due for refill today: Yes  Requested medication (s) are on the active medication list: Yes  Last refill:  02/03/22  Future visit scheduled: Yes  Notes to clinic:  See request.    Requested Prescriptions  Pending Prescriptions Disp Refills   diazepam (VALIUM) 5 MG tablet [Pharmacy Med Name: DIAZEPAM 5 MG TABLET] 90 tablet 0    Sig: TAKE 1 TABLET IN THE MORNING AND 2 TABLETS AT BEDTIME     Not Delegated - Psychiatry: Anxiolytics/Hypnotics 2 Failed - 03/04/2022 10:09 AM      Failed - This refill cannot be delegated      Failed - Urine Drug Screen completed in last 360 days      Passed - Patient is not pregnant      Passed - Valid encounter within last 6 months    Recent Outpatient Visits           2 months ago Non-ST elevation (NSTEMI) myocardial infarction [facility])   [facility] [place], [name], NP   3 months ago Excessive daytime sleepiness   [facility] [place], [name], NP   6 months ago Anxiety and depression   [place], [name], NP   8 months ago [facility] [place], [place], NP   11 months ago HTN (hypertension), benign   [facility], [name], NP       Future Appointments             In 2 months [name], [name], NP [facility], [place]   In 4 months Hilty, Nadean Corwin, MD [facility], [place]   In 5 months Ellyn Hack, Leonie Green, MD [facility], [place]

## 2022-03-06 ENCOUNTER — Encounter: Payer: Medicare Other | Admitting: *Deleted

## 2022-03-06 DIAGNOSIS — I214 Non-ST elevation (NSTEMI) myocardial infarction: Secondary | ICD-10-CM

## 2022-03-06 DIAGNOSIS — Z955 Presence of coronary angioplasty implant and graft: Secondary | ICD-10-CM

## 2022-03-11 DIAGNOSIS — I214 Non-ST elevation (NSTEMI) myocardial infarction: Secondary | ICD-10-CM | POA: Diagnosis not present

## 2022-03-11 DIAGNOSIS — Z955 Presence of coronary angioplasty implant and graft: Secondary | ICD-10-CM

## 2022-03-11 NOTE — Progress Notes (Signed)
Discharge Report Referring Provider: [name] graduated today from  rehab with 36 sessions completed.  Details of the patient's exercise prescription and what He needs to do in order to continue the prescription and progress were discussed with patient.  Patient was given a copy of prescription and goals.  Patient verbalized understanding.  Grant Young plans to continue to exercise by going to [employer].   Grant Young Name 12/18/21 1614 03/02/22 1129       6 Minute Walk   Phase Initial Discharge    Distance 805 feet 1240 feet    Distance % Change -- 54 %    Distance Feet Change -- 435 ft    Walk Time 6 minutes 6 minutes    # of Rest Breaks 0 0    MPH 1.52 2.35    METS 1.97 2.99    RPE 12 11    Perceived Dyspnea  0 1    VO2 Peak 6.9 10.45    Symptoms Yes (comment) No    Comments back pain 4/10 (chronic) --    Resting HR 52 bpm 50 bpm    Resting BP 122/68 122/62    Resting Oxygen Saturation  97 % 97 %    Exercise Oxygen Saturation  during 6 min walk 100 % 92 %    Max Ex. HR 63 bpm 68 bpm    Max Ex. BP 124/66 124/72    2 Minute Post BP 118/68 120/74            Thank you for the referral!

## 2022-03-11 NOTE — Progress Notes (Signed)
Daily Session Note  Patient Details  Name: Grant Young MRN: [id number] Date of Birth: [birth year] Referring Provider:   Flowsheet Row Cardiac Rehab from 12/18/2021 in [facility] Cardiac and Pulmonary Rehab  Referring Provider Ellyn Hack       Encounter Date: 03/11/2022  Check In:  Session Check In - 03/11/22 1129       Check-In   Supervising physician immediately available to respond to emergencies See telemetry face sheet for immediately available ER MD    Location [facility]-Cardiac & Pulmonary Rehab    Staff Present Carson Myrtle, BS, RRT, CPFT;Joseph Tessie Fass, Fabio Neighbors, MPA, RN;Jessica [place], MA, RCEP, CCRP, CCET    Virtual Visit No    Medication changes reported     No    Fall or balance concerns reported    No    Warm-up and Cool-down Performed on first and last piece of equipment    Resistance Training Performed Yes    VAD Patient? No    PAD/SET Patient? No      Pain Assessment   Currently in Pain? No/denies                Social History   Tobacco Use  Smoking Status Never  Smokeless Tobacco Never    Goals Met:  Independence with exercise equipment Exercise tolerated well No report of concerns or symptoms today Strength training completed today  Goals Unmet:  Not Applicable  Comments:  [name] graduated today from  rehab with 36 sessions completed.  Details of the patient's exercise prescription and what He needs to do in order to continue the prescription and progress were discussed with patient.  Patient was given a copy of prescription and goals.  Patient verbalized understanding.  [name] plans to continue to exercise by going to [employer].    Dr. Emily Filbert is Medical Director for [place].  Dr. Ottie Glazier is Medical Director for [facility] Pulmonary Rehabilitation.

## 2022-03-11 NOTE — Progress Notes (Signed)
Cardiac Individual Treatment Plan  Patient Details  Name: Grant Young MRN: [id number] Date of Birth: 1946-02-21 Referring Provider:   Flowsheet Row Cardiac Rehab from 12/18/2021 in [facility] Cardiac and Pulmonary Rehab  Referring Provider Ellyn Hack       Initial Encounter Date:  Flowsheet Row Cardiac Rehab from 12/18/2021 in [facility] Cardiac and Pulmonary Rehab  Date 12/18/21       Visit Diagnosis: NSTEMI (non-ST elevated myocardial infarction) [facility])  Status post coronary artery stent placement  Patient's Home Medications on Admission:  Current Outpatient Medications:    anastrozole (ARIMIDEX) 1 MG tablet, 1/2 tablet (0.5 mg) by mouth once weekly, Disp: , Rfl:    aspirin 81 MG chewable tablet, Chew 1 tablet (81 mg total) by mouth daily., Disp: 90 tablet, Rfl: 3   butalbital-acetaminophen-caffeine (FIORICET) 50-325-40 MG tablet, Take 1 tablet by mouth every 6 (six) hours as needed for headache., Disp: , Rfl:    cetirizine (ZYRTEC) 10 MG tablet, Take 10 mg by mouth daily as needed for allergies., Disp: , Rfl:    Cholecalciferol 50 MCG (2000 UT) CAPS, Take 1 tablet by mouth daily., Disp: , Rfl:    clopidogrel (PLAVIX) 75 MG tablet, DAY 1- TAKE 4 TABLETS (300 MG) BY MOUTH X 1 DOSE, THEN DAY 2- START 1 TABLET (75 MG) BY MOUTH ONCE DAILY, Disp: 30 tablet, Rfl: 1   Cyanocobalamin (VITAMIN B-12) 5000 MCG LOZG, Take 1 lozenge by mouth daily. , Disp: , Rfl:    DHEA 25 MG CAPS, Take 25 mg by mouth daily., Disp: , Rfl:    diazepam (VALIUM) 5 MG tablet, TAKE 1 TABLET IN THE MORNING AND 2 TABLETS AT BEDTIME, Disp: 90 tablet, Rfl: 0   dicyclomine (BENTYL) 20 MG tablet, Take 20 mg by mouth as needed., Disp: , Rfl:    Digestive Aids Mixture (DIGESTION GB PO), Take 1 capsule by mouth daily., Disp: , Rfl:    escitalopram (LEXAPRO) 20 MG tablet, TAKE 1 TABLET BY MOUTH EVERY DAY, Disp: 90 tablet, Rfl: 1   ezetimibe (ZETIA) 10 MG tablet, Take 10 mg by mouth daily., Disp: , Rfl:    HYDROcodone-acetaminophen  (NORCO) 10-325 MG tablet, Take 1 tablet by mouth 3 (three) times daily as needed., Disp: 30 tablet, Rfl: 0   [START ON 04/04/2022] HYDROcodone-acetaminophen (NORCO) 10-325 MG tablet, Take 1 tablet by mouth 3 (three) times daily as needed., Disp: 30 tablet, Rfl: 0   [START ON 05/04/2022] HYDROcodone-acetaminophen (NORCO) 10-325 MG tablet, Take 1 tablet by mouth 3 (three) times daily as needed., Disp: 30 tablet, Rfl: 0   Loperamide HCl (IMODIUM PO), Take by mouth as needed. , Disp: , Rfl:    MAGNESIUM BISGLYCINATE PO, Take by mouth daily., Disp: , Rfl:    melatonin 5 MG TABS, Take 5 mg by mouth at bedtime., Disp: , Rfl:    NONFORMULARY OR COMPOUNDED ITEM, Super Bi-Mix Injection  Papaverine HCI 68m/mL Phentolamine Mesylate 1869mmL Inject 0.69m62m 1mL54mcrease as needed to achieve erection, Disp: 5 each, Rfl: 6   Nutritional Supplements (NUTRITIONAL SUPPLEMENT PO), Take 1 capsule by mouth daily. [name], Disp: , Rfl:    Nutritional Supplements (NUTRITIONAL SUPPLEMENT PO), Take by mouth daily. InflammaSaver, Disp: , Rfl:    ondansetron (ZOFRAN) 4 MG tablet, Take 4 mg by mouth every 6 (six) hours as needed for nausea or vomiting. (Patient not taking: Reported on 02/11/2022), Disp: , Rfl:    polyethylene glycol (MIRALAX / GLYCOLAX) 17 g packet, Take 17 g by  mouth daily as needed., Disp: , Rfl:    Probiotic Product (PROBIOTIC DAILY PO), Take by mouth daily., Disp: , Rfl:    prochlorperazine (COMPAZINE) 10 MG tablet, Take 10 mg by mouth. TAKE 1 TABLET (10 MG TOTAL) BY MOUTH EVERY 6 (SIX) HOURS AS NEEDED for HEADACHE AND NAUSEA, LIMIT USE TO 3 DAYS PER WEEK, Disp: , Rfl:    propranolol (INDERAL) 40 MG tablet, Take 1 tablet (40 mg total) by mouth 2 (two) times daily., Disp: 180 tablet, Rfl: 1   pyridostigmine (MESTINON) 60 MG tablet, TAKE 1 TABLET BY MOUTH 2 TIMES DAILY., Disp: 180 tablet, Rfl: 0   RABEprazole (ACIPHEX) 20 MG tablet, TAKE 1 TABLET BY MOUTH TWICE A DAY, Disp: 180 tablet, Rfl: 0    sucralfate (CARAFATE) 1 g tablet, Take 1 g by mouth 4 (four) times daily as needed. , Disp: , Rfl:    testosterone cypionate (DEPOTESTOSTERONE CYPIONATE) 200 MG/ML injection, Inject 80 mg into the muscle every 14 (fourteen) days., Disp: , Rfl:    valACYclovir (VALTREX) 1000 MG tablet, Take 2 tabs p.o. and repeat in 12 hours as needed for cold sore, Disp: 30 tablet, Rfl: 0  Past Medical History: Past Medical History:  Diagnosis Date   Anxiety    Per New Patient Packet   Chronic back pain    Per New Patient Packet   Chronic heart disease    Per New Patient Packet   Depression    GERD (gastroesophageal reflux disease)    Headache    History of CT scan of brain 09/14/2018   Per New Patient Packet   History of depression    Per New Patient Packet   History of gastritis    Per New Patient Packet   History of headache    Per New Patient Packet   History of kidney stones    Per New Patient Packet   History of neuropathy    Per New Patient Packet   Hypertension    Per New Patient Packet   Insomnia    Kidney stone    Lumbar radicular pain    Per New Patient Packet   Lyme disease    Per New Patient Packet   Myocardial infarction [facility])    Overactive bladder    PONV (postoperative nausea and vomiting)    Skin cancer, basal cell    Sleep trouble    Per New Patient Packet   Spinal cord stimulator status    01/08/21 - not currently using.   Squamous cell skin cancer    Substance abuse (HCC)     Tobacco Use: Social History   Tobacco Use  Smoking Status Never  Smokeless Tobacco Never    Labs: Review Flowsheet  More data may exist      Latest Ref Rng & Units 08/24/2019 10/17/2019 05/16/2020 11/11/2021  Labs for ITP Cardiac and Pulmonary Rehab  Cholestrol <200 mg/dL 168     - 170  170   LDL (calc) mg/dL (calc) 96     - 100  103   Direct LDL 0 - 99 mg/dL - - - -  HDL-C > OR = 40 mg/dL 48     - 56  48   Trlycerides <150 mg/dL 121     - 59  99   Hemoglobin A1c - - 5.1     - -       01/30/2022  Labs for ITP Cardiac and Pulmonary Rehab  Cholestrol -  LDL (calc) -  Direct LDL 98.5   HDL-C -  Trlycerides -  Hemoglobin A1c -    Details       This result is from an external source.          Exercise Target Goals: Exercise Program Goal: Individual exercise prescription set using results from initial 6 min walk test and THRR while considering  patient's activity barriers and safety.   Exercise Prescription Goal: Initial exercise prescription builds to 30-45 minutes a day of aerobic activity, 2-3 days per week.  Home exercise guidelines will be given to patient during program as part of exercise prescription that the participant will acknowledge.   Education: Aerobic Exercise: - Group verbal and visual presentation on the components of exercise prescription. Introduces F.I.T.T principle from ACSM for exercise prescriptions.  Reviews F.I.T.T. principles of aerobic exercise including progression. Written material given at graduation. Flowsheet Row Cardiac Rehab from 02/18/2022 in [facility] Cardiac and Pulmonary Rehab  Date 01/14/22  Educator [place]  Instruction Review Code 1- Verbalizes Understanding       Education: Resistance Exercise: - Group verbal and visual presentation on the components of exercise prescription. Introduces F.I.T.T principle from ACSM for exercise prescriptions  Reviews F.I.T.T. principles of resistance exercise including progression. Written material given at graduation. Flowsheet Row Cardiac Rehab from 02/18/2022 in [facility] Cardiac and Pulmonary Rehab  Date 01/21/22  Educator [facility]  Instruction Review Code 1- Verbalizes Understanding        Education: Exercise & Equipment Safety: - Individual verbal instruction and demonstration of equipment use and safety with use of the equipment. Flowsheet Row Cardiac Rehab from 02/18/2022 in [facility] Cardiac and Pulmonary Rehab  Date 12/18/21  Educator [place]  Instruction Review Code 1- Verbalizes Understanding        Education: Exercise Physiology & General Exercise Guidelines: - Group verbal and written instruction with models to review the exercise physiology of the cardiovascular system and associated critical values. Provides general exercise guidelines with specific guidelines to those with heart or lung disease.  Flowsheet Row Cardiac Rehab from 02/18/2022 in [facility] Cardiac and Pulmonary Rehab  Education need identified 12/18/21  Date 01/07/22  Educator [facility]  Instruction Review Code 1- [employer] Understanding       Education: Flexibility, Balance, Mind/Body Relaxation: - Group verbal and visual presentation with interactive activity on the components of exercise prescription. Introduces F.I.T.T principle from ACSM for exercise prescriptions. Reviews F.I.T.T. principles of flexibility and balance exercise training including progression. Also discusses the mind body connection.  Reviews various relaxation techniques to help reduce and manage stress (i.e. Deep breathing, progressive muscle relaxation, and visualization). Balance handout provided to take home. Written material given at graduation. Flowsheet Row Cardiac Rehab from 02/18/2022 in [facility] Cardiac and Pulmonary Rehab  Date 01/28/22  Educator [facility]  Instruction Review Code 1- Verbalizes Understanding       Activity Barriers & Risk Stratification:  Activity Barriers & Cardiac Risk Stratification - 12/08/21 1408       Activity Barriers & Cardiac Risk Stratification   Activity Barriers Back Problems;Neck/Spine Problems;Other (comment);Deconditioning;Balance Concerns    Comments Spinal Cord Stimulator (left flank)- reports it doesn't work and alters his digestive system; peripheral neuropathy below knees (wide gait)    Cardiac Risk Stratification High             6 Minute Walk:  6 Minute Walk     Row Name 12/18/21 1614 03/02/22 1129       6 Minute Walk   Phase Initial Discharge  Distance 805 feet 1240 feet    Distance %  Change -- 54 %    Distance Feet Change -- 435 ft    Walk Time 6 minutes 6 minutes    # of Rest Breaks 0 0    MPH 1.52 2.35    METS 1.97 2.99    RPE 12 11    Perceived Dyspnea  0 1    VO2 Peak 6.9 10.45    Symptoms Yes (comment) No    Comments back pain 4/10 (chronic) --    Resting HR 52 bpm 50 bpm    Resting BP 122/68 122/62    Resting Oxygen Saturation  97 % 97 %    Exercise Oxygen Saturation  during 6 min walk 100 % 92 %    Max Ex. HR 63 bpm 68 bpm    Max Ex. BP 124/66 124/72    2 Minute Post BP 118/68 120/74             Oxygen Initial Assessment:   Oxygen Re-Evaluation:   Oxygen Discharge (Final Oxygen Re-Evaluation):   Initial Exercise Prescription:  Initial Exercise Prescription - 12/18/21 1600       Date of Initial Exercise RX and Referring Provider   Date 12/18/21    Referring Provider Ellyn Hack      Oxygen   Oxygen Continuous    Maintain Oxygen Saturation 88% or higher      Treadmill   MPH 1.5    Grade 0    Minutes 15    METs 2.15      Recumbant Bike   Level 2    RPM 60    Minutes 15    METs 2      NuStep   Level 2    SPM 80    Minutes 15    METs 2      T5 Nustep   Level 1    SPM 80    Minutes 15    METs 2      Track   Laps 18    Minutes 15    METs 2      Prescription Details   Frequency (times per week) 3    Duration Progress to 30 minutes of continuous aerobic without signs/symptoms of physical distress      Intensity   THRR 40-80% of Max Heartrate 90-128    Ratings of Perceived Exertion 11-13    Perceived Dyspnea 0-4      Resistance Training   Training Prescription Yes    Weight 3 lb    Reps 10-15             Perform Capillary Blood Glucose checks as needed.  Exercise Prescription Changes:   Exercise Prescription Changes     Row Name 12/18/21 1600 12/24/21 1600 01/05/22 1600 01/20/22 0900 02/02/22 1400     Response to Exercise   Blood Pressure (Admit) 122/68 112/68 110/68 110/62 128/80   Blood Pressure  (Exercise) 124/66 138/76 140/78 124/54 122/68   Blood Pressure (Exit) 118/68 104/68 108/66 118/62 118/58   Heart Rate (Admit) 52 bpm 63 bpm 67 bpm 61 bpm 66 bpm   Heart Rate (Exercise) 63 bpm 84 bpm 96 bpm 85 bpm 69 bpm   Heart Rate (Exit) 60 bpm 61 bpm 62 bpm 63 bpm 54 bpm   Oxygen Saturation (Admit) 97 % -- -- -- --   Oxygen Saturation (Exercise) 100 % -- -- -- --   Rating of Perceived Exertion (Exercise)  '12 13 14 15 13   ' Perceived Dyspnea (Exercise) 0 -- -- -- --   Symptoms back pain 4/10 -- none none none   Comments -- 2nd day -- -- --   Duration -- Progress to 30 minutes of  aerobic without signs/symptoms of physical distress Continue with 30 min of aerobic exercise without signs/symptoms of physical distress. Continue with 30 min of aerobic exercise without signs/symptoms of physical distress. Continue with 30 min of aerobic exercise without signs/symptoms of physical distress.   Intensity -- THRR unchanged THRR unchanged THRR unchanged THRR unchanged     Progression   Progression -- Continue to progress workloads to maintain intensity without signs/symptoms of physical distress. Continue to progress workloads to maintain intensity without signs/symptoms of physical distress. Continue to progress workloads to maintain intensity without signs/symptoms of physical distress. Continue to progress workloads to maintain intensity without signs/symptoms of physical distress.   Average METs -- 2 2.43 2.48 2.34     Resistance Training   Training Prescription -- Yes Yes Yes Yes   Weight -- 3 lb 4 lb 4 lb 4 lb   Reps -- 10-15 10-15 10-15 10-15     Interval Training   Interval Training -- -- No No No     Treadmill   MPH -- -- 2 -- --   Grade -- -- 0 -- --   Minutes -- -- 15 -- --   METs -- -- 2.53 -- --     Recumbant Bike   Level -- 2 3 1.1 3   Watts -- -- -- -- 22   Minutes -- '15 15 15 15   ' METs -- 2 3.21 3.23 3     NuStep   Level -- '1 4 6 5   ' Minutes -- '15 15 15 15   ' METs -- '2 2  3 ' 2.5     Recumbant Elliptical   Level -- -- -- 1 --   Minutes -- -- -- 15 --     REL-XR   Level -- -- -- -- 3   Minutes -- -- -- -- 15   METs -- -- -- -- 2.2     T5 Nustep   Level -- -- '3 3 3   ' Minutes -- -- '15 15 15   ' METs -- -- 2 2.6 1.8     Track   Laps -- -- -- 23 22   Minutes -- -- -- 15 15   METs -- -- -- 2.25 2.2     Oxygen   Maintain Oxygen Saturation -- -- 88% or higher 88% or higher 88% or higher    Row Name 02/11/22 1100 02/16/22 0800 03/02/22 1200         Response to Exercise   Blood Pressure (Admit) -- 128/66 122/62     Blood Pressure (Exit) -- 120/66 114/58     Heart Rate (Admit) -- 57 bpm 55 bpm     Heart Rate (Exercise) -- 79 bpm 80 bpm     Heart Rate (Exit) -- 56 bpm 57 bpm     Rating of Perceived Exertion (Exercise) -- 14 13     Symptoms -- none none     Duration -- Continue with 30 min of aerobic exercise without signs/symptoms of physical distress. Continue with 30 min of aerobic exercise without signs/symptoms of physical distress.     Intensity -- THRR unchanged THRR unchanged       Progression   Progression -- Continue to progress  workloads to maintain intensity without signs/symptoms of physical distress. Continue to progress workloads to maintain intensity without signs/symptoms of physical distress.     Average METs -- 2.18 2.65       Resistance Training   Training Prescription -- Yes Yes     Weight -- 4 lb 4 lb     Reps -- 10-15 10-15       Interval Training   Interval Training -- No No       Recumbant Bike   Level -- 3 5     Watts -- -- 33     Minutes -- 15 15     METs -- 3.24 3.23       NuStep   Level -- 6 6     Minutes -- 15 15     METs -- 2.6 2.6       REL-XR   Level -- 2 6     Minutes -- 15 15     METs -- 2 2.8       T5 Nustep   Level -- -- 4     Minutes -- -- 15     METs -- -- 1.9       Track   Laps -- 27 --     Minutes -- 15 --     METs -- 2.47 --       Home Exercise Plan   Plans to continue exercise at  [employer] (comment)  personal trainer and [place] (comment)  Medical sales representative (comment)  personal trainer and water aerobics     Frequency Add 2 additional days to program exercise sessions. Add 2 additional days to program exercise sessions. Add 2 additional days to program exercise sessions.     Initial Home Exercises Provided 02/11/22 02/11/22 02/11/22       Oxygen   Maintain Oxygen Saturation -- 88% or higher 88% or higher              Exercise Comments:   Exercise Comments     Row Name 03/11/22 1130           Exercise Comments Grant Young graduated today from  rehab with 36 sessions completed.  Details of the patient's exercise prescription and what He needs to do in order to continue the prescription and progress were discussed with patient.  Patient was given a copy of prescription and goals.  Patient verbalized understanding.  Grant Young plans to continue to exercise by going to [employer].                Exercise Goals and Review:   Exercise Goals     Row Name 12/18/21 1634             Exercise Goals   Increase Physical Activity Yes       Intervention Provide advice, education, support and counseling about physical activity/exercise needs.;Develop an individualized exercise prescription for aerobic and resistive training based on initial evaluation findings, risk stratification, comorbidities and participant's personal goals.       Expected Outcomes Short Term: Attend rehab on a regular basis to increase amount of physical activity.;Long Term: Add in home exercise to make exercise part of routine and to increase amount of physical activity.;Long Term: Exercising regularly at least 3-5 days a week.       Increase Strength and Stamina Yes       Intervention Provide advice, education, support and counseling about  physical activity/exercise needs.;Develop an individualized exercise prescription for  aerobic and resistive training based on initial evaluation findings, risk stratification, comorbidities and participant's personal goals.       Expected Outcomes Short Term: Increase workloads from initial exercise prescription for resistance, speed, and METs.;Short Term: Perform resistance training exercises routinely during rehab and add in resistance training at home;Long Term: Improve cardiorespiratory fitness, muscular endurance and strength as measured by increased METs and functional capacity (6MWT)       Able to understand and use rate of perceived exertion (RPE) scale Yes       Intervention Provide education and explanation on how to use RPE scale       Expected Outcomes Short Term: Able to use RPE daily in rehab to express subjective intensity level;Long Term:  Able to use RPE to guide intensity level when exercising independently       Able to understand and use Dyspnea scale Yes       Intervention Provide education and explanation on how to use Dyspnea scale       Expected Outcomes Short Term: Able to use Dyspnea scale daily in rehab to express subjective sense of shortness of breath during exertion;Long Term: Able to use Dyspnea scale to guide intensity level when exercising independently       Knowledge and understanding of Target Heart Rate Range (THRR) Yes       Intervention Provide education and explanation of THRR including how the numbers were predicted and where they are located for reference       Expected Outcomes Short Term: Able to state/look up THRR;Short Term: Able to use daily as guideline for intensity in rehab;Long Term: Able to use THRR to govern intensity when exercising independently       Able to check pulse independently Yes       Intervention Provide education and demonstration on how to check pulse in carotid and radial arteries.;Review the importance of being able to check your own pulse for safety during independent exercise       Expected Outcomes Short Term: Able  to explain why pulse checking is important during independent exercise;Long Term: Able to check pulse independently and accurately       Understanding of Exercise Prescription Yes       Intervention Provide education, explanation, and written materials on patient's individual exercise prescription       Expected Outcomes Short Term: Able to explain program exercise prescription;Long Term: Able to explain home exercise prescription to exercise independently                Exercise Goals Re-Evaluation :  Exercise Goals Re-Evaluation     Row Name 12/22/21 1125 01/05/22 1613 01/20/22 0909 02/02/22 1419 02/11/22 1134     Exercise Goal Re-Evaluation   Exercise Goals Review Increase Physical Activity;Able to understand and use rate of perceived exertion (RPE) scale;Knowledge and understanding of Target Heart Rate Range (THRR);Understanding of Exercise Prescription;Increase Strength and Stamina Increase Physical Activity;Increase Strength and Stamina;Understanding of Exercise Prescription Increase Physical Activity;Increase Strength and Stamina;Understanding of Exercise Prescription Increase Physical Activity;Increase Strength and Stamina;Understanding of Exercise Prescription Increase Physical Activity;Increase Strength and Stamina;Understanding of Exercise Prescription   Comments Reviewed RPE and dyspnea scales, THR and program prescription with pt today.  Pt voiced understanding and was given a copy of goals to take home. Grant Young is off to a good start in rehab.  He has already completed his 5 full exercise sessions.  He is already up to  4 lb hand weights too.  We will conitnue to monitor his progress. Grant Young is doing well in rehab. He has increased to level 6 on the T4 Nustep. He also has increased his laps to 23 on the track. He is not quite hitting his THRZ during exercise, however, his RPEs are in appropriate range. Will continue to monitor. Grant Young continues to do well in rehab.  He is up to level  3 on both T5 and bike.  We will conitnue to monitor his progress. Grant Young is doing well in rehab.  He is going to gym again and aquatic fitness class again.  He goes there on Tuesdays and Thursdays.  He is feeling stronger, just still fatigued from lack of sleep, but they are working on that. Reviewed home exercise with pt today.  Pt plans to walk and go to gym and water aerobics for exercise.  He works with a Physiological scientist at [employer].  Reviewed THR, pulse, RPE, sign and symptoms, pulse oximetery and when to call 911 or MD.  Also discussed weather considerations and indoor options.  Pt voiced understanding.   Expected Outcomes Short: Use RPE daily to regulate intensity. Long: Follow program prescription in THR. Short: Conitnue to attend rehab regularly Long: Continue to follow program prescription Short: Continue to increase workload on recumbant bike Long: Continue to increase overall MET level Short: Increased other seated pieces to match level 5 from T4 NuStep Long: Continue to improve stamina Short: conitnue to get to gym off days Long: Continue to improve stamina    Row Name 02/16/22 0839 03/02/22 1256           Exercise Goal Re-Evaluation   Exercise Goals Review Increase Physical Activity;Increase Strength and Stamina;Understanding of Exercise Prescription Increase Physical Activity;Increase Strength and Stamina;Understanding of Exercise Prescription      Comments Grant Young continues to do well in rehab. He has reached a max of 27 laps on the track. He is also back up to level 6 on the T4 Nustep. He is not quite hitting his THR but RPEs are in appropriate range. Will continue to monitor. Grant Young is doing well in rehab. He completed his post 6MWT and improved by 54%!! He is up to 33 watts on the recumbant bike and increased to level 6 on the XR. He will be graduating soon and we will continue to monitor until then.      Expected Outcomes Short: Continue to work up laps on track Long: Continue to  increase overall MET level Short: Graduate Long: Continue to build up overall strength and stamina               Discharge Exercise Prescription (Final Exercise Prescription Changes):  Exercise Prescription Changes - 03/02/22 1200       Response to Exercise   Blood Pressure (Admit) 122/62    Blood Pressure (Exit) 114/58    Heart Rate (Admit) 55 bpm    Heart Rate (Exercise) 80 bpm    Heart Rate (Exit) 57 bpm    Rating of Perceived Exertion (Exercise) 13    Symptoms none    Duration Continue with 30 min of aerobic exercise without signs/symptoms of physical distress.    Intensity THRR unchanged      Progression   Progression Continue to progress workloads to maintain intensity without signs/symptoms of physical distress.    Average METs 2.65      Resistance Training   Training Prescription Yes    Weight 4 lb  Reps 10-15      Interval Training   Interval Training No      Recumbant Bike   Level 5    Watts 33    Minutes 15    METs 3.23      NuStep   Level 6    Minutes 15    METs 2.6      REL-XR   Level 6    Minutes 15    METs 2.8      T5 Nustep   Level 4    Minutes 15    METs 1.9      Home Exercise Plan   Plans to continue exercise at [employer] (comment)   personal trainer and water aerobics   Frequency Add 2 additional days to program exercise sessions.    Initial Home Exercises Provided 02/11/22      Oxygen   Maintain Oxygen Saturation 88% or higher             Nutrition:  Target Goals: Understanding of nutrition guidelines, daily intake of sodium <1569m, cholesterol <2040m calories 30% from fat and 7% or less from saturated fats, daily to have 5 or more servings of fruits and vegetables.  Education: All About Nutrition: -Group instruction provided by verbal, written material, interactive activities, discussions, models, and posters to present general guidelines for heart healthy nutrition including fat, fiber, MyPlate, the role of  sodium in heart healthy nutrition, utilization of the nutrition label, and utilization of this knowledge for meal planning. Follow up email sent as well. Written material given at graduation. [facility]  Education need identified 12/18/21  Date 02/04/22  Educator MCLittle SilverInstruction Review Code 1- Verbalizes Understanding       Biometrics:  Pre Biometrics - 12/18/21 1635       Pre Biometrics   Height '5\' 9"'  (1.753 m)    Weight 141 lb 9.6 oz (64.2 kg)    BMI (Calculated) 20.9    Single Leg Stand 2.88 seconds             Post Biometrics - 03/02/22 1131        Post  Biometrics   Height '6\' 1"'  (1.854 m)    Weight 141 lb 4.8 oz (64.1 kg)    BMI (Calculated) 18.65    Single Leg Stand 5.09 seconds             Nutrition Therapy Plan and Nutrition Goals:  Nutrition Therapy & Goals - 01/12/22 1034       Nutrition Therapy   Diet Heart healthy, low Na    Protein (specify units) 50g    Fiber 30 grams    Whole Grain Foods 3 servings    Saturated Fats 16 max. grams    Fruits and Vegetables 8 servings/day    Sodium 2 grams      Personal Nutrition Goals   Nutrition Goal ST: eat carbohydrate richs fruits and vegetables at each meal daily LT: meet nutritional needs (vitamins, minerals, macronutrients, calories)    Comments [age].o. M admitted to cardiac rehab s/p NTEMI. PMHx HTN, CAD, GERD, HLD, chronic pain symdrome, tremor, chronic migraine, HFpEF. PSHx cholecystectomy.  Relevant medications include  amitriptyline, anastrozole, chlorthalidone, vit B12, Vit K1 and K2, antioxidnat supplement, DHEA, valium, dicyclomine, digestive aids mixture, lexapro, imodium, magnesium, melatonin, zofran, miralax, probiotic, prochlorperazine, rabeprazole, sucralfate, testosterone cypionate. PYP Score: 77. Vegetables & Fruits 8/12. Breads, Grains & Cereals 8/12. Red &  Processed Meat 10/12. Poultry 2/2. Fish & Shellfish 1/4. Beans, Nuts &  Seeds 2/4. Milk & Dairy Foods 5/6. Toppings, Oils, Seasonings & Salt 18/20. Sweets, Snacks & Restaurant Food 13/14. Beverages 10/10.  "low inflammatory diet, detox diet" This plan includes a shake as well as pills. He will eat meat for each meal with lots of vegetables and fruit. No added sugar, no coffee, no dairy, no grains, no nuts, no processed foods. His wife will cook with olive oil and use it for salads. He will have fruit 1x/day and sweet potatoes 1x/week.  This particular plan is intended for 21 days. Pt reports that his doctor has recommended this diet for neuropathy. They have cauliflower, broccoli, celery, cucmbers, salad greens, asaparagus. They eat chicken, grass-fed beef occasionally, salmon. Cautioned against the restrictive nature of this diet. Discussed how despite getting enough protein, if calorie needs are not met, he could still lose muscle tissue. Discussed the importance of calories and carbohydrates for meeting nutritional needs as well as energy. They would like to stay gluten-free after this diet, suggested including gluten-free whole grains such as oats and quinoa. Suggested adding back in nuts and seeds as well after they finish the diet as they would like to continue with this diet plan. Suggested adding carbohydrates allowed in this diet to his meals daily. Reviewed general heart healthy eating.      Intervention Plan   Intervention Prescribe, educate and counsel regarding individualized specific dietary modifications aiming towards targeted core components such as weight, hypertension, lipid management, diabetes, heart failure and other comorbidities.;Nutrition handout(s) given to patient.    Expected Outcomes Short Term Goal: Understand basic principles of dietary content, such as calories, fat, sodium, cholesterol and nutrients.;Short Term Goal: A plan has been developed with personal nutrition goals set during dietitian appointment.;Long Term Goal: Adherence to prescribed  nutrition plan.             Nutrition Assessments:  MEDIFICTS Score Key: ?70 Need to make dietary changes  40-70 Heart Healthy Diet ? 40 Therapeutic Level Cholesterol Diet  Flowsheet Row Cardiac Rehab from 03/04/2022 in [facility] Cardiac and Pulmonary Rehab  Picture Your Plate Total Score on Admission 77  Picture Your Plate Total Score on Discharge 75      Picture Your Plate Scores: <50 Unhealthy dietary pattern with much room for improvement. 41-50 Dietary pattern unlikely to meet recommendations for good health and room for improvement. 51-60 More healthful dietary pattern, with some room for improvement.  >60 Healthy dietary pattern, although there may be some specific behaviors that could be improved.    Nutrition Goals Re-Evaluation:  Nutrition Goals Re-Evaluation     Grant Young Name 02/11/22 1131             Goals   Nutrition Goal ST: eat carbohydrate richs fruits and vegetables at each meal daily LT: meet nutritional needs (vitamins, minerals, macronutrients, calories)       Comment Grant Young is doing well in rehab.  He is doing well with his diet.  He and his wife are watching what they eat closely.  When he met with Melissa, he was encouraged to eat fruit/veggies with each meal.  They have been doing better with this and are looking forward to things coming into season now.  They have been eating a lot of strawberries recently.  They aim to get a good variety.       Expected Outcome Short: Continue to add in more fruit and vegetables Long: Continue to follow  heart healthy diet.                Nutrition Goals Discharge (Final Nutrition Goals Re-Evaluation):  Nutrition Goals Re-Evaluation - 02/11/22 1131       Goals   Nutrition Goal ST: eat carbohydrate richs fruits and vegetables at each meal daily LT: meet nutritional needs (vitamins, minerals, macronutrients, calories)    Comment Bubber is doing well in rehab.  He is doing well with his diet.  He and his wife are  watching what they eat closely.  When he met with Melissa, he was encouraged to eat fruit/veggies with each meal.  They have been doing better with this and are looking forward to things coming into season now.  They have been eating a lot of strawberries recently.  They aim to get a good variety.    Expected Outcome Short: Continue to add in more fruit and vegetables Long: Continue to follow heart healthy diet.             Psychosocial: Target Goals: Acknowledge presence or absence of significant depression and/or stress, maximize coping skills, provide positive support system. Participant is able to verbalize types and ability to use techniques and skills needed for reducing stress and depression.   Education: Stress, Anxiety, and Depression - Group verbal and visual presentation to define topics covered.  Reviews how body is impacted by stress, anxiety, and depression.  Also discusses healthy ways to reduce stress and to treat/manage anxiety and depression.  Written material given at graduation. Flowsheet Row Cardiac Rehab from 02/18/2022 in [facility] Cardiac and Pulmonary Rehab  Date 12/31/21  Educator [facility]  Instruction Review Code 1- [employer] Understanding       Education: Sleep Hygiene -Provides group verbal and written instruction about how sleep can affect your health.  Define sleep hygiene, discuss sleep cycles and impact of sleep habits. Review good sleep hygiene tips.    Initial Review & Psychosocial Screening:  Initial Psych Review & Screening - 12/08/21 1429       Initial Review   Current issues with Current Anxiety/Panic;Current Stress Concerns;Current Sleep Concerns    Source of Stress Concerns Chronic Illness      Family Dynamics   Good Support System? Yes   wife     Barriers   Psychosocial barriers to participate in program There are no identifiable barriers or psychosocial needs.;The patient should benefit from training in stress management and relaxation.       Screening Interventions   Interventions Encouraged to exercise;Provide feedback about the scores to participant;To provide support and resources with identified psychosocial needs    Expected Outcomes Short Term goal: Utilizing psychosocial counselor, staff and physician to assist with identification of specific Stressors or current issues interfering with healing process. Setting desired goal for each stressor or current issue identified.;Long Term Goal: Stressors or current issues are controlled or eliminated.;Short Term goal: Identification and review with participant of any Quality of Life or Depression concerns found by scoring the questionnaire.;Long Term goal: The participant improves quality of Life and PHQ9 Scores as seen by post scores and/or verbalization of changes             Quality of Life Scores:   Quality of Life - 03/04/22 1124       Quality of Life Scores   Health/Function Pre 15.13 %    Health/Function Post 16.3 %    Health/Function % Change 7.73 %    Socioeconomic Pre 20.71 %    Socioeconomic  Post 15.75 %    Socioeconomic % Change  -23.95 %    Psych/Spiritual Pre 18.57 %    Psych/Spiritual Post 17.21 %    Psych/Spiritual % Change -7.32 %    Family Pre 20.5 %    Family Post 22.4 %    Family % Change 9.27 %    GLOBAL Pre 17.78 %    GLOBAL Post 19.51 %    GLOBAL % Change 9.73 %            Scores of 19 and below usually indicate a poorer quality of life in these areas.  A difference of  2-3 points is a clinically meaningful difference.  A difference of 2-3 points in the total score of the Quality of Life Index has been associated with significant improvement in overall quality of life, self-image, physical symptoms, and general health in studies assessing change in quality of life.  PHQ-9: Review Flowsheet  More data exists      03/04/2022 02/11/2022 01/16/2022 12/18/2021 12/16/2021  Depression screen PHQ 2/9  Decreased Interest '1 1 2 3 2  ' Down, Depressed,  Hopeless '1 1 1 1 1  ' PHQ - 2 Score '2 2 3 4 3  ' Altered sleeping '2 3 3 3 3  ' Tired, decreased energy '3 3 3 3 3  ' Change in appetite 1 0 0 0 0  Feeling bad or failure about yourself  1 0 0 0 0  Trouble concentrating '3 1 2 2 1  ' Moving slowly or fidgety/restless '2 1 1 2 3  ' Suicidal thoughts 0 0 0 0 0  PHQ-9 Score '14 10 12 14 13  ' Difficult doing work/chores Somewhat difficult Somewhat difficult Somewhat difficult Somewhat difficult Somewhat difficult   Interpretation of Total Score  Total Score Depression Severity:  1-4 = Minimal depression, 5-9 = Mild depression, 10-14 = Moderate depression, 15-19 = Moderately severe depression, 20-27 = Severe depression   Psychosocial Evaluation and Intervention:  Psychosocial Evaluation - 12/08/21 1429       Psychosocial Evaluation & Interventions   Interventions Encouraged to exercise with the program and follow exercise prescription;Stress management education;Relaxation education    Comments Mr. 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 He states he has felt more tired and his brain feels foggy after his MI. Staff encouraged him to talk to his PCP at his appointment next week. His medical history complicates narrowing down specific causes for his symptoms. He wants to get back to his exercise routine at [facility] and thinks this program will help him get there. His biggest complaint is his chronic back pain for which he has a spinal cord stimulator that is currently off because he felt it was not working and it was causing GI issues. His MD is aware of this. He also has peripheral neuropathy that causes him to use a wide gait. He is hopeful that he can find exercise equipment that will work for him. He has a history of anxiety and depression, which are currently being managed by medication and lifestyle habits.    Expected Outcomes Short: attend cardiac rehab for education and exercise. Long; Develop and maintain positive self care habits.     Continue Psychosocial Services  Follow up required by staff             Psychosocial Re-Evaluation:  Psychosocial Re-Evaluation     Grant Young Name 01/16/22 1130 02/11/22 1126           Psychosocial Re-Evaluation  Current issues with Current Stress Concerns;Current Psychotropic Meds Current Stress Concerns;Current Psychotropic Meds;Current Sleep Concerns      Comments Reviewed patient health questionnaire (PHQ-9) with patient for follow up. Previously, patients score indicated signs/symptoms of depression.  Reviewed to see if patient is improving symptom wise while in program.  Score improved and patient states that it is because he has been a little more social. He feels tired and worn out at times and is not able to go out. He does not like that he is not able to be social at times because he does not feel up to it. Sayf is doing well in rehab.  His score improved by 2 pts.  He is still struggling with sleep and thus fatigue.  He has been napping and lacking energy a lot.  He had a sleep study last week and gets the results this Friday afternoon.  He will let us know what they say about his sleep habits.  He continues to enjoy getting out when able but easily frustrated with not being able to do and do as he would like to do.  His mood is a little more stable and feeling better overall.      Expected Outcomes Short: Continue to attend [facility] regularly for regular exercise and social engagement. Long: Continue to improve symptoms and manage a positive mental state. Short: Follow up on sleep study Long: Continue to focus on positive      Interventions Encouraged to attend Cardiac Rehabilitation for the exercise Encouraged to attend Cardiac Rehabilitation for the exercise;Stress management education      Continue Psychosocial Services  Follow up required by staff Follow up required by staff               Psychosocial Discharge (Final Psychosocial Re-Evaluation):  Psychosocial  Re-Evaluation - 02/11/22 1126       Psychosocial Re-Evaluation   Current issues with Current Stress Concerns;Current Psychotropic Meds;Current Sleep Concerns    Comments Grant Young is doing well in rehab.  His score improved by 2 pts.  He is still struggling with sleep and thus fatigue.  He has been napping and lacking energy a lot.  He had a sleep study last week and gets the results this Friday afternoon.  He will let us know what they say about his sleep habits.  He continues to enjoy getting out when able but easily frustrated with not being able to do and do as he would like to do.  His mood is a little more stable and feeling better overall.    Expected Outcomes Short: Follow up on sleep study Long: Continue to focus on positive    Interventions Encouraged to attend Cardiac Rehabilitation for the exercise;Stress management education    Continue Psychosocial Services  Follow up required by staff             Vocational Rehabilitation: Provide vocational rehab assistance to qualifying candidates.   Vocational Rehab Evaluation & Intervention:  Vocational Rehab - 12/08/21 1429       Initial Vocational Rehab Evaluation & Intervention   Assessment shows need for Vocational Rehabilitation No             Education: Education Goals: Education classes will be provided on a variety of topics geared toward better understanding of heart health and risk factor modification. Participant will state understanding/return demonstration of topics presented as noted by education test scores.  Learning Barriers/Preferences:  Learning Barriers/Preferences - 12/08/21 1429  Learning Barriers/Preferences   Learning Barriers None    Learning Preferences Individual Instruction             General Cardiac Education Topics:  AED/CPR: - Group verbal and written instruction with the use of models to demonstrate the basic use of the AED with the basic ABC's of resuscitation.   Anatomy  and Cardiac Procedures: - Group verbal and visual presentation and models provide information about basic cardiac anatomy and function. Reviews the testing methods done to diagnose heart disease and the outcomes of the test results. Describes the treatment choices: Medical Management, Angioplasty, or Coronary Bypass Surgery for treating various heart conditions including Myocardial Infarction, Angina, Valve Disease, and Cardiac Arrhythmias.  Written material given at graduation. Flowsheet Row Cardiac Rehab from 02/18/2022 in [facility] Cardiac and Pulmonary Rehab  Date 01/21/22  Educator SB  Instruction Review Code 1- Verbalizes Understanding       Medication Safety: - Group verbal and visual instruction to review commonly prescribed medications for heart and lung disease. Reviews the medication, class of the drug, and side effects. Includes the steps to properly store meds and maintain the prescription regimen.  Written material given at graduation. Flowsheet Row Cardiac Rehab from 02/18/2022 in [facility] Cardiac and Pulmonary Rehab  Date 02/11/22  Educator SB  Instruction Review Code 1- Verbalizes Understanding       Intimacy: - Group verbal instruction through game format to discuss how heart and lung disease can affect sexual intimacy. Written material given at graduation.. Flowsheet Row Cardiac Rehab from 02/18/2022 in [facility] Cardiac and Pulmonary Rehab  Date 01/14/22  Educator [facility]  Instruction Review Code 1- Verbalizes Understanding       Know Your Numbers and Heart Failure: - Group verbal and visual instruction to discuss disease risk factors for cardiac and pulmonary disease and treatment options.  Reviews associated critical values for Overweight/Obesity, Hypertension, Cholesterol, and Diabetes.  Discusses basics of heart failure: signs/symptoms and treatments.  Introduces Heart Failure Zone chart for action plan for heart failure.  Written material given at graduation. Flowsheet Row Cardiac  Rehab from 02/18/2022 in [facility] Cardiac and Pulmonary Rehab  Date 02/18/22  Educator SB  Instruction Review Code 1- Verbalizes Understanding       Infection Prevention: - Provides verbal and written material to individual with discussion of infection control including proper hand washing and proper equipment cleaning during exercise session. Flowsheet Row Cardiac Rehab from 02/18/2022 in [facility] Cardiac and Pulmonary Rehab  Date 12/18/21  Educator AS  Instruction Review Code 1- Verbalizes Understanding       Falls Prevention: - Provides verbal and written material to individual with discussion of falls prevention and safety. Flowsheet Row Cardiac Rehab from 02/18/2022 in [facility] Cardiac and Pulmonary Rehab  Date 12/18/21  Educator AS  Instruction Review Code 1- Verbalizes Understanding       Other: -Provides group and verbal instruction on various topics (see comments)   Knowledge Questionnaire Score:  Knowledge Questionnaire Score - 03/04/22 1124       Knowledge Questionnaire Score   Pre Score 23/26    Post Score 25/26             Core Components/Risk Factors/Patient Goals at Admission:  Personal Goals and Risk Factors at Admission - 12/08/21 1428       Core Components/Risk Factors/Patient Goals on Admission   Hypertension Yes    Intervention Provide education on lifestyle modifcations including regular physical activity/exercise, weight management, moderate sodium restriction and increased consumption of  fresh fruit, vegetables, and low fat dairy, alcohol moderation, and smoking cessation.;Monitor prescription use compliance.    Expected Outcomes Short Term: Continued assessment and intervention until BP is < 140/60m HG in hypertensive participants. < 130/875mHG in hypertensive participants with diabetes, heart failure or chronic kidney disease.;Long Term: Maintenance of blood pressure at goal levels.    Lipids Yes    Intervention Provide education and support for  participant on nutrition & aerobic/resistive exercise along with prescribed medications to achieve LDL <7070mHDL >13m44m  Expected Outcomes Short Term: Participant states understanding of desired cholesterol values and is compliant with medications prescribed. Participant is following exercise prescription and nutrition guidelines.;Long Term: Cholesterol controlled with medications as prescribed, with individualized exercise RX and with personalized nutrition plan. Value goals: LDL < 70mg65mL > 40 mg.             Education:Diabetes - Individual verbal and written instruction to review signs/symptoms of diabetes, desired ranges of glucose level fasting, after meals and with exercise. Acknowledge that pre and post exercise glucose checks will be done for 3 sessions at entry of program.   Core Components/Risk Factors/Patient Goals Review:   Goals and Risk Factor Review     Row Name 01/16/22 1133 02/11/22 1129           Core Components/Risk Factors/Patient Goals Review   Personal Goals Review Weight Management/Obesity;Hypertension Weight Management/Obesity;Hypertension      Review ClaytIsaiahss to gain some muslce and gain some weight. His blood pressure has been doing well in the program and within normal limits. He would like to intake more protien to gain some weight. Informed patient of certain protiens that can be good for him. He has no food restrictions. ClaytYanceyoing well in rehab.  He was having a hard time with his Brillinta and was recently switched to plavix.  So far, he is feeling better.  He had a sleep study last week and will get results this Friday.  His pressuers are doing well and staying steady in the 110s-130s.   He is doing well with his weight and staying steady.      Expected Outcomes Short: increase protien intake. Long: maintain a diet that adheres to him. Short: Continue to adjust to new medicine and watch for side effects Long: COnitnue to monitor risk factors.                Core Components/Risk Factors/Patient Goals at Discharge (Final Review):   Goals and Risk Factor Review - 02/11/22 1129       Core Components/Risk Factors/Patient Goals Review   Personal Goals Review Weight Management/Obesity;Hypertension    Review ClaytNetaneloing well in rehab.  He was having a hard time with his Brillinta and was recently switched to plavix.  So far, he is feeling better.  He had a sleep study last week and will get results this Friday.  His pressuers are doing well and staying steady in the 110s-130s.   He is doing well with his weight and staying steady.    Expected Outcomes Short: Continue to adjust to new medicine and watch for side effects Long: COnitnue to monitor risk factors.             ITP Comments:  ITP Comments     Row Name 12/08/21 1406 12/18/21 1644 12/22/21 1123 12/31/21 1311 01/12/22 1121   ITP Comments Initial telephone orientation completed. Diagnosis can be found in [facility]. EP Orientation scheduled for  Thursday 4/6 at 2pm. Completed 6MWT and gym orientation. Initial ITP created and sent for review to Dr. Emily Filbert, Medical Director. First full day of exercise!  Patient was oriented to gym and equipment including functions, settings, policies, and procedures.  Patient's individual exercise prescription and treatment plan were reviewed.  All starting workloads were established based on the results of the 6 minute walk test done at initial orientation visit.  The plan for exercise progression was also introduced and progression will be customized based on patient's performance and goals. 30 Day review completed. Medical Director ITP review done, changes made as directed, and signed approval by Medical Director. Completed initial RD consultation    [place] Name 01/28/22 0828 02/25/22 1140 03/11/22 1130       ITP Comments 30 Day review completed. Medical Director ITP review done, changes made as directed, and signed approval by Medical  Director. 30 Day review completed. Medical Director ITP review done, changes made as directed, and signed approval by Medical Director. Grant Young graduated today from  rehab with 36 sessions completed.  Details of the patient's exercise prescription and what He needs to do in order to continue the prescription and progress were discussed with patient.  Patient was given a copy of prescription and goals.  Patient verbalized understanding.  Grant Young plans to continue to exercise by going to [employer].              Comments: discharge ITP

## 2022-03-12 ENCOUNTER — Encounter: Payer: Self-pay | Admitting: Internal Medicine

## 2022-03-12 ENCOUNTER — Ambulatory Visit (INDEPENDENT_AMBULATORY_CARE_PROVIDER_SITE_OTHER): Payer: Medicare Other | Admitting: Internal Medicine

## 2022-03-12 VITALS — BP 138/80 | HR 52 | Temp 96.9°F | Wt 136.0 lb

## 2022-03-12 DIAGNOSIS — N401 Enlarged prostate with lower urinary tract symptoms: Secondary | ICD-10-CM

## 2022-03-12 DIAGNOSIS — R350 Frequency of micturition: Secondary | ICD-10-CM

## 2022-03-12 DIAGNOSIS — G43719 Chronic migraine without aura, intractable, without status migrainosus: Secondary | ICD-10-CM

## 2022-03-12 DIAGNOSIS — B9789 Other viral agents as the cause of diseases classified elsewhere: Secondary | ICD-10-CM

## 2022-03-12 DIAGNOSIS — I249 Acute ischemic heart disease, unspecified: Secondary | ICD-10-CM

## 2022-03-12 DIAGNOSIS — J329 Chronic sinusitis, unspecified: Secondary | ICD-10-CM | POA: Diagnosis not present

## 2022-03-12 NOTE — Progress Notes (Signed)
Subjective:    Patient ID: Grant Young, male    DOB: June 01, 1946, 76 y.o.   MRN: [id number]  HPI  Patient presents to clinic today for follow-up of his headaches.  These occur daily.  They are located in his forehead and behind his eyes. He reports the headaches are debilitating. He reports associated sensitivity to light and sound. This is currently managed on Propanolol and Fioricet which he does not feel like is adequately managing his headaches. He is unable to get a MRI brain due to spinal cord stimulator. He has failed :  Preventative: - Aimovig '140mg'$ - started 12/01/19-stopped 02/2020- no benefit after 3 months - Propranolol '40mg'$  BID or tremor; up to '60mg'$  BID- no benefit for headache- 09/2019- current - Amitriptyline '40mg'$  qhs for pain/sleep. - no benefit for headache- current - Topiramate- was prescribed for HA prevention, this was only tried for a few days- concern over side effects and limited benefit - gabapentin '3600mg'$  - tried for neuropathy; didn't tolerate and didn't help - Lyrica - didn't help  - Lamictal for neuropathy - no effect - Cymbalta for neuropathy - rash - Melatonin '5mg'$  qhs - Magnesium Abortive: Roselyn Meier- made headache worse- no benefit - Tylenol- rebound headaches - NSAIDs- GI upset - Fioricet- dulls pain a little - Sumatriptan - no benefit and caused chest pressure - Norco 10/325 BID prn for back pain Injections: - Botox started 01/01/20 for migraines by Dr. Holley Raring in Pain clinic in [place]- no benefit with first round; round 2 on 05/03/20 ([facility]), round 3 (09/04/20), round 4 (12/02/20) - SPG block by Dr. Holley Raring on 03/13/20 which didn't help. - bilateral occipital nerve block on 04/01/20 which helped about 25% for a few days.   He recently stopped taking Botox because he did not feel ike it was helpful either. He does follow with neurology but no longer wants to see neurology because he reports they are not doing anything to manage his pain. He would like me to  start prescribing his Compazine and Fioricet.  He also reports runny nose, nasal congestion, post nasal drip and cough. This started 2 week ago. He is blowing clear mucous out of his nose. The cough is mostly nonproductive but when he does get something up, it is clear. He has an associated headache but this is a daily concern. He denies ear pain, sore throat, shortness of breath, fever, chills or body aches. He has tried Zyrtec and a nasal spray OTC with minimal relief of symptoms.  He also reports urinary urgency, but hesitation, frequency, and urinary retention. This has been an ongoing issues. He denies blood in his urine, worsening low back pain, fever, chills or nausea. He is concerned he may have a UTI. He has a history of BPH (although he reports he was told this was not true). He has been treated with Flomax, Myrbetriq and Gemtasa in the past.  Review of Systems     Past Medical History:  Diagnosis Date   Anxiety    Per New Patient Packet   Chronic back pain    Per New Patient Packet   Chronic heart disease    Per New Patient Packet   Depression    GERD (gastroesophageal reflux disease)    Headache    History of CT scan of brain 09/14/2018   Per New Patient Packet   History of depression    Per New Patient Packet   History of gastritis    Per New Patient Packet  History of headache    Per New Patient Packet   History of kidney stones    Per New Patient Packet   History of neuropathy    Per New Patient Packet   Hypertension    Per New Patient Packet   Insomnia    Kidney stone    Lumbar radicular pain    Per New Patient Packet   Lyme disease    Per New Patient Packet   Myocardial infarction [facility])    Overactive bladder    PONV (postoperative nausea and vomiting)    Skin cancer, basal cell    Sleep trouble    Per New Patient Packet   Spinal cord stimulator status    01/08/21 - not currently using.   Squamous cell skin cancer    Substance abuse (HCC)      Current Outpatient Medications  Medication Sig Dispense Refill   anastrozole (ARIMIDEX) 1 MG tablet 1/2 tablet (0.5 mg) by mouth once weekly     aspirin 81 MG chewable tablet Chew 1 tablet (81 mg total) by mouth daily. 90 tablet 3   butalbital-acetaminophen-caffeine (FIORICET) 50-325-40 MG tablet Take 1 tablet by mouth every 6 (six) hours as needed for headache.     cetirizine (ZYRTEC) 10 MG tablet Take 10 mg by mouth daily as needed for allergies.     Cholecalciferol 50 MCG (2000 UT) CAPS Take 1 tablet by mouth daily.     clopidogrel (PLAVIX) 75 MG tablet DAY 1- TAKE 4 TABLETS (300 MG) BY MOUTH X 1 DOSE, THEN DAY 2- START 1 TABLET (75 MG) BY MOUTH ONCE DAILY 30 tablet 1   Cyanocobalamin (VITAMIN B-12) 5000 MCG LOZG Take 1 lozenge by mouth daily.      DHEA 25 MG CAPS Take 25 mg by mouth daily.     diazepam (VALIUM) 5 MG tablet TAKE 1 TABLET IN THE MORNING AND 2 TABLETS AT BEDTIME 90 tablet 0   dicyclomine (BENTYL) 20 MG tablet Take 20 mg by mouth as needed.     Digestive Aids Mixture (DIGESTION GB PO) Take 1 capsule by mouth daily.     escitalopram (LEXAPRO) 20 MG tablet TAKE 1 TABLET BY MOUTH EVERY DAY 90 tablet 1   ezetimibe (ZETIA) 10 MG tablet Take 10 mg by mouth daily.     HYDROcodone-acetaminophen (NORCO) 10-325 MG tablet Take 1 tablet by mouth 3 (three) times daily as needed. 30 tablet 0   [START ON 04/04/2022] HYDROcodone-acetaminophen (NORCO) 10-325 MG tablet Take 1 tablet by mouth 3 (three) times daily as needed. 30 tablet 0   [START ON 05/04/2022] HYDROcodone-acetaminophen (NORCO) 10-325 MG tablet Take 1 tablet by mouth 3 (three) times daily as needed. 30 tablet 0   Loperamide HCl (IMODIUM PO) Take by mouth as needed.      MAGNESIUM BISGLYCINATE PO Take by mouth daily.     melatonin 5 MG TABS Take 5 mg by mouth at bedtime.     NONFORMULARY OR COMPOUNDED ITEM Super Bi-Mix Injection  Papaverine HCI '30mg'$ /mL Phentolamine Mesylate '1mg'$ /mL Inject 0.82m - 174mIncrease as needed to  achieve erection 5 each 6   Nutritional Supplements (NUTRITIONAL SUPPLEMENT PO) Take 1 capsule by mouth daily. [name]     Nutritional Supplements (NUTRITIONAL SUPPLEMENT PO) Take by mouth daily. InflammaSaver     ondansetron (ZOFRAN) 4 MG tablet Take 4 mg by mouth every 6 (six) hours as needed for nausea or vomiting. (Patient not taking: Reported on 02/11/2022)  polyethylene glycol (MIRALAX / GLYCOLAX) 17 g packet Take 17 g by mouth daily as needed.     Probiotic Product (PROBIOTIC DAILY PO) Take by mouth daily.     prochlorperazine (COMPAZINE) 10 MG tablet Take 10 mg by mouth. TAKE 1 TABLET (10 MG TOTAL) BY MOUTH EVERY 6 (SIX) HOURS AS NEEDED for HEADACHE AND NAUSEA, LIMIT USE TO 3 DAYS PER WEEK     propranolol (INDERAL) 40 MG tablet Take 1 tablet (40 mg total) by mouth 2 (two) times daily. 180 tablet 1   pyridostigmine (MESTINON) 60 MG tablet TAKE 1 TABLET BY MOUTH 2 TIMES DAILY. 180 tablet 0   RABEprazole (ACIPHEX) 20 MG tablet TAKE 1 TABLET BY MOUTH TWICE A DAY 180 tablet 0   sucralfate (CARAFATE) 1 g tablet Take 1 g by mouth 4 (four) times daily as needed.      testosterone cypionate (DEPOTESTOSTERONE CYPIONATE) 200 MG/ML injection Inject 80 mg into the muscle every 14 (fourteen) days.     valACYclovir (VALTREX) 1000 MG tablet Take 2 tabs p.o. and repeat in 12 hours as needed for cold sore 30 tablet 0   No current facility-administered medications for this visit.    Allergies  Allergen Reactions   Ace Inhibitors     Other reaction(s): Cough   Fluoxetine Anxiety    "made me fall asleep" per pt "bad headaches and "makes  Me  Crazy" historical allergy noted in [employer] "made me fall asleep" per pt "bad headaches and "makes  Me  Crazy" Per New Patient Packet.     Metoclopramide     Other reaction(s): Other (See Comments), Other (See Comments), Unknown Tardive Dyskinesia  historical allergy noted in [employer] Tardive Dyskinesia  Per New Patient Packet.   Nalbuphine      Used Post Back surgery- Anesthesiologist Error. Patient had Narcotic Withdraw. Per New Patient Packet.    Other     Other reaction(s): Other (See Comments) Altered mental status in combo with narcotics at previous hospitalization - Full Withdrawal Symptoms Other reaction(s): Rash   Amoxicillin-Pot Clavulanate Nausea Only    Per New Patient Packet.   Doxazosin Rash    Other reaction(s): Other - See Comments, Rash UNKNOWN REACTION UNKNOWN REACTION    Duloxetine Nausea Only    Per New Patient Packet.    Penicillins Nausea Only    Per New Patient Packet.   Tamsulosin Itching and Anxiety    Restless, Flushing, Heavy Chest, Itching, Hyperactive mood and Anxiety. Unable to handle side effects. Per New Patient Packet.     Trazodone And Nefazodone Itching, Anxiety and Rash    Headache. "INCREASED MY ANXIETY AND HEARTRATE" Flushing, tachycardia "INCREASED MY ANXIETY AND HEARTRATE" Per New Patient Packet.    Amlodipine     Shaking, unsure of reaction. Per New Patient Packet.     Cinoxacin     GI Intolerance, and Dizziness. Per New Patient Packet.    Ciprofloxacin     Other reaction(s): Unknown   Fludrocortisone Other (See Comments)    "Worsening headaches, GI issues, fatigue"   Nebivolol     Other reaction(s): Unknown   Olanzapine     Headache and unable to sleep for 3 nights. Per New Patient Packet.    Olmesartan     Other reaction(s): Unknown   Pregabalin     Confusion, Lack of concentration, dizziness, and likely drowsiness. Per New Patient Packet.     Prostaglandins     Other reaction(s): Other (See Comments) Intolerance   Thyroid  Hormones     Other reaction(s): Other (See Comments) Thyroid (Nature Thyroid) contraindicated with some of your other medications.   Zolpidem     Nightmares, Ineffective after 2 days. Per New Patient Packet.    Duloxetine Hcl     Other reaction(s): Rash   Fluoxetine Hcl     Other reaction(s): Rash   Nucynta [Tapentadol] Other (See  Comments)    Vertigo    Phenytoin Anxiety    Hyperactivity, and Ineffective. Per New Patient Packet.     Family History  Problem Relation Age of Onset   Heart disease Father    Heart failure Father    Hypertension Father    Stroke Father    Stroke Mother    Dementia Mother    Kidney Stones Daughter    Anxiety disorder Daughter    OCD Daughter     Social History   Socioeconomic History   Marital status: Married    Spouse name: Not on file   Number of children: Not on file   Years of education: Not on file   Highest education level: Not on file  Occupational History   Not on file  Tobacco Use   Smoking status: Never   Smokeless tobacco: Never  Vaping Use   Vaping Use: Never used  Substance and Sexual Activity   Alcohol use: Yes    Comment: 1 Drink a Month, socially   Drug use: Not Currently   Sexual activity: Yes    Birth control/protection: None  Other Topics Concern   Not on file  Social History Narrative   Tobacco use, amount per day now: None   Past tobacco use, amount per day: None   How many years did you use tobacco: 0   Alcohol use (drinks per week): 0-1 Month   Diet:   Do you drink/eat things with caffeine: Occasionally ( Hot Chocolate and maybe 1/4 of 16oz Pepsi 2-3 times a week.   Marital status: Married                                  What year were you married? 1970   Do you live in a house, apartment, assisted living, condo, trailer, etc.? House   Is it one or more stories? One   How many persons live in your home? 2   Do you have pets in your home?( please list)  No   Highest Level of education completed: Masters   Current or past profession: Intensive Transport planner for [employer]   Do you exercise? Yes                                    Type and how often? [place], Recumbent Bike, 3 times a week. Try to get a 1.2 mile walk at least 3 times a week or more.     Do you have a living will? Yes   Do you have a DNR form?  No                                  If not, do you want to discuss one?   Do you have signed POA/HPOA forms? Yes  If so, please bring to you appointment    Do you have any difficulty bathing or dressing yourself? No    Do you have difficulty preparing food or eating? No   Do you have difficulty managing your medications? No   Do you have any difficulty managing your finances? No   Do you have any difficulty affording your medications? No         Social Determinants of Radio broadcast assistant Strain: Not on file  Food Insecurity: Not on file  Transportation Needs: Not on file  Physical Activity: Not on file  Stress: Not on file  Social Connections: Not on file  Intimate Partner Violence: Not on file     Constitutional: Patient reports headaches.  Denies fever, malaise, fatigue, or abrupt weight changes.  HEENT: Pt reports sensitivity to light, runny nose, nasal congestion and post nasal drip. Denies eye pain, eye redness, ear pain, ringing in the ears, wax buildup, bloody nose, or sore throat. Respiratory: Pt reports cough. Denies difficulty breathing, shortness of breath, or sputum production.   Cardiovascular: Denies chest pain, chest tightness, palpitations or swelling in the hands or feet.  Gastrointestinal: Pt reports abdominal pain. Denies bloating, constipation, diarrhea or blood in the stool.  GU: Denies urgency, frequency, pain with urination, burning sensation, blood in urine, odor or discharge. Musculoskeletal: Pt reports chronic neck and back pain. Denies decrease in range of motion, difficulty with gait, or joint swelling.  Skin: Denies redness, rashes, lesions or ulcercations.  Neurological: Pt reports difficulty with balance. Denies dizziness, difficulty with memory, difficulty with speech or problems with coordination.  Psych: Pt has a history of anxiety and depression. Denies SI/HI.  No other specific complaints in a complete review of systems (except  as listed in HPI above).  Objective:   Physical Exam  BP 138/80 (BP Location: Right Arm, Patient Position: Sitting, Cuff Size: Normal)   Pulse (!) 52   Temp (!) 96.9 F (36.1 C) (Temporal)   Wt 136 lb (61.7 kg)   SpO2 99%   BMI 17.94 kg/m   Wt Readings from Last 3 Encounters:  03/02/22 141 lb 4.8 oz (64.1 kg)  02/19/22 142 lb (64.4 kg)  01/30/22 142 lb (64.4 kg)    General: Appears his stated age, chronically ill appearing, in NAD. Skin: Warm, dry and intact.  HEENT: Head: normal shape and size; Eyes: sclera white, no icterus, conjunctiva pink, PERRLA and EOMs intact; Ears: Tm's gray and intact, normal light reflex; Nose: mucosa pink and moist, septum midline; Throat/Mouth: Teeth present, mucosa pink and moist,  + PND, no exudate, lesions or ulcerations noted.  Neck:  No adenopathy noted. Cardiovascular:  Bradycardic with normal rhythm.  Pulmonary/Chest: Normal effort and positive vesicular breath sounds. No respiratory distress. No wheezes, rales or ronchi noted.  Abdomen: Soft and tender in the epigastric region. No CVA tenderness noted.  Musculoskeletal: Gait slow, slightly unsteady without use of assistive device. Neurological: Alert and oriented.    BMET    Component Value Date/Time   NA 146 (H) 12/26/2021 1524   K 4.2 12/26/2021 1524   CL 104 12/26/2021 1524   CO2 27 12/26/2021 1524   GLUCOSE 75 12/26/2021 1524   GLUCOSE 100 (H) 11/30/2021 0415   BUN 14 12/26/2021 1524   CREATININE 0.82 12/26/2021 1524   CALCIUM 9.3 12/26/2021 1524   GFRNONAA >60 11/30/2021 0415   GFRAA 101 05/20/2020 0000    Lipid Panel     Component Value Date/Time  CHOL 170 11/11/2021 1006   TRIG 99 11/11/2021 1006   HDL 48 11/11/2021 1006   CHOLHDL 3.5 11/11/2021 1006   LDLCALC 103 (H) 11/11/2021 1006    CBC    Component Value Date/Time   WBC 5.5 12/26/2021 1524   WBC 7.2 11/30/2021 0415   RBC 4.85 12/26/2021 1524   RBC 4.60 11/30/2021 0415   HGB 14.4 12/26/2021 1524   HCT  43.5 12/26/2021 1524   PLT 202 12/26/2021 1524   MCV 90 12/26/2021 1524   MCH 29.7 12/26/2021 1524   MCH 29.3 11/30/2021 0415   MCHC 33.1 12/26/2021 1524   MCHC 32.8 11/30/2021 0415   RDW 13.3 12/26/2021 1524    Hgb A1C Lab Results  Component Value Date   HGBA1C 5.1 10/17/2019           Assessment & Plan:   Viral Sinusitis:  Failed OTC treatment No indication for antibiotics at this time Rx for Pred taper x6 days  BPH:  He has seen urology in the past for this and failed multiple medications Urinalysis: Normal No indication for urine culture at this time He should follow-up with urology for the same  Chronic Headaches:  He is asked me to take over treatment for this, advised him I am a generalized and not a specialist and I can fill his medications but I am not going to be able to fix his headaches I do feel like a lot of his headaches are due to medication overuse and we discussed this Fioricet and Compazine refilled today He would like referral to neurology for second opinion  RTC in 3 months for follow-up of chronic conditions Webb Silversmith, NP

## 2022-03-13 ENCOUNTER — Encounter: Payer: Self-pay | Admitting: Internal Medicine

## 2022-03-13 LAB — POCT URINALYSIS DIPSTICK
Bilirubin, UA: NEGATIVE
Blood, UA: NEGATIVE
Glucose, UA: NEGATIVE
Ketones, UA: NEGATIVE
Leukocytes, UA: NEGATIVE
Nitrite, UA: NEGATIVE
Protein, UA: NEGATIVE
Spec Grav, UA: <=1.005 — AB (ref 1.010–1.025)
Urobilinogen, UA: 0.2 E.U./dL
pH, UA: 7 (ref 5.0–8.0)

## 2022-03-13 MED ORDER — PROCHLORPERAZINE MALEATE 10 MG PO TABS
10.0000 mg | ORAL_TABLET | Freq: Three times a day (TID) | ORAL | 0 refills | Status: DC | PRN
Start: 2022-03-13 — End: 2022-05-14

## 2022-03-13 MED ORDER — PREDNISONE 10 MG PO TABS: ORAL_TABLET | ORAL | 0 refills | Status: DC

## 2022-03-13 MED ORDER — BUTALBITAL-APAP-CAFFEINE 50-325-40 MG PO TABS: 1.0000 | ORAL_TABLET | Freq: Four times a day (QID) | ORAL | 0 refills | Status: DC | PRN

## 2022-03-13 NOTE — Patient Instructions (Signed)
Analgesic Rebound Headache An analgesic rebound headache, sometimes called a medication overuse headache or a drug-induced headache, is a secondary disorder that is caused by the overuse of pain medicine (analgesic) to treat the original (primary) headache. Any type of primary headache can return as a rebound headache if a person regularly takes analgesics. The types of primary headaches that are commonly associated with rebound headaches include: Migraines. Headaches that are caused by tense muscles in the head and neck area (tension headaches). Headaches that develop and happen again on one side of the head and around the eye (cluster headaches). If rebound headaches continue, they can become long-term, daily headaches. What are the causes? This condition may be caused by frequent use of: Over-the-counter medicines such as aspirin, ibuprofen, and acetaminophen. Sinus-relief medicines and medicines that contain caffeine. Narcotic pain medicines such as codeine and oxycodone. Some prescription migraine medicines. What are the signs or symptoms? The symptoms of a rebound headache are the same as the symptoms of the original headache. Some of the symptoms of specific types of headaches include: Migraine headache Pulsing or throbbing pain on one or both sides of the head. Severe pain that interferes with daily activities. Pain that gets worse with physical activity. Nausea, vomiting, or both. Pain and sensitivity with exposure to bright light, loud noises, or strong smells. Visual changes. Numbness of one or both arms. Tension headache Pressure around the head. Dull, aching head pain. Pain felt over the front and sides of the head. Tenderness in the muscles of the head, neck, and shoulders. Cluster headache Severe pain that begins in or around one eye or temple. Droopy or swollen eyelid, or redness and tearing in the eye on the same side as the pain. One-sided head pain. Nausea. Runny  nose. Sweaty, pale facial skin. Restlessness. How is this diagnosed? This condition is diagnosed by: Reviewing your medical history. This includes the nature of your primary headaches. Reviewing the types of pain medicines that you have been using to treat your primary headaches and how often you take them. How is this treated? This condition may be treated or managed by: Discontinuing frequent use of the analgesic medicine. Doing this may worsen your headaches at first, but the pain should eventually become more manageable, less frequent, and less severe. Seeing a headache specialist. He or she may be able to help you manage your headaches and help make sure there is not another cause of the headaches. Using methods of stress relief, such as acupuncture, counseling, biofeedback, and massage. Talk with your health care provider about which methods might be good for you. Follow these instructions at home: Medicines  Take over-the-counter and prescription medicines only as told by your health care provider. Stop the repeated use of pain medicine as told by your health care provider. Stopping can be difficult. Carefully follow instructions from your health care provider. Lifestyle  Follow a regular sleep schedule. Do not vary the time that you go to bed or the amount that you sleep from day to day. It is important to stay on the same schedule to help prevent headaches. Get 7-9 hours of sleep each night, or the amount recommended by your health care provider. Exercise regularly. Exercise for at least 30 minutes, 5 times each week. Limit or manage stress. Consider stress-relief options such as acupuncture, counseling, biofeedback, and massage. Talk with your health care provider about which methods might be good for you. Do not drink alcohol. Do not use any products that contain nicotine  or tobacco, such as cigarettes, e-cigarettes, and chewing tobacco. If you need help quitting, ask your health  care provider. General instructions Avoid triggers that are known to cause your primary headaches. Keep all follow-up visits as told by your health care provider. This is important. Contact a health care provider if: You continue to experience headaches after following treatments that your health care provider recommended. Get help right away if you have: New headache pain. Headache pain that is different than what you have experienced in the past. Numbness or tingling in your arms or legs. Changes in your speech or vision. Summary An analgesic rebound headache, sometimes called a medication overuse headache or a drug-induced headache, is caused by the overuse of pain medicine (analgesic) to treat the original (primary) headache. Any type of primary headache can return as a rebound headache if a person regularly takes analgesics. The types of primary headaches that are commonly associated with rebound headaches include migraines, tension headaches, and cluster headaches. Analgesic rebound headaches can occur with frequent use of over-the-counter medicines and prescription medicines. Treatment involves stopping the medicine that is being overused. This will improve headache frequency and severity. This information is not intended to replace advice given to you by your health care provider. Make sure you discuss any questions you have with your health care provider. Document Revised: 09/28/2019 Document Reviewed: 09/28/2019 Elsevier Patient Education  [place].

## 2022-03-16 ENCOUNTER — Telehealth: Payer: Self-pay | Admitting: Student in an Organized Health Care Education/Training Program

## 2022-03-16 ENCOUNTER — Other Ambulatory Visit: Payer: Self-pay | Admitting: *Deleted

## 2022-03-16 MED ORDER — HYDROCODONE-ACETAMINOPHEN 10-325 MG PO TABS: 1.0000 | ORAL_TABLET | Freq: Three times a day (TID) | ORAL | 0 refills | Status: DC | PRN

## 2022-03-16 NOTE — Telephone Encounter (Addendum)
Patient says his scripts are not written correctly, please call asap. His scripts all say 30 tablets instead of 90 tablets, with instructions telling him to take 1 tab x3 per day. Please call before 1:30 if possible, he will not be able to take calls between 1:30 and 5pm

## 2022-03-16 NOTE — Telephone Encounter (Signed)
Message sent to [facility] for correction of qty.

## 2022-03-18 ENCOUNTER — Encounter: Payer: Self-pay | Admitting: Internal Medicine

## 2022-03-18 IMAGING — CT CT ANGIO HEAD
3 of 11 series · 15 of 47 positions shown · IV contrast (omnipaque)
Comparison: None.

CLINICAL DATA: Chronic daily headaches with intermittent dizziness.
Peripheral neuropathy.

EXAM:
CT ANGIOGRAPHY HEAD
TECHNIQUE: Multidetector CT imaging of the head was performed using the
standard protocol during bolus administration of intravenous
contrast. Multiplanar CT image reconstructions and MIPs were
obtained to evaluate the vascular anatomy.
CONTRAST:  75mL OMNIPAQUE IOHEXOL 350 MG/ML SOLN

[Series 17: ax thin mips cta brain 1.00 · axial · 0.42mm/px · z∈[-520,-406]mm · 9 of 144 slices shown]
[im 15/144  brain]
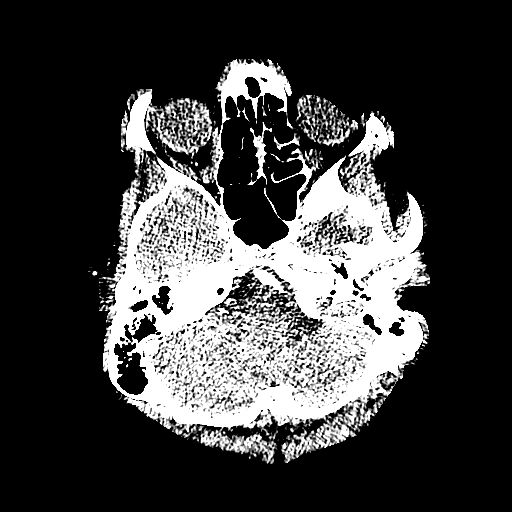
[im 29/144  bone]
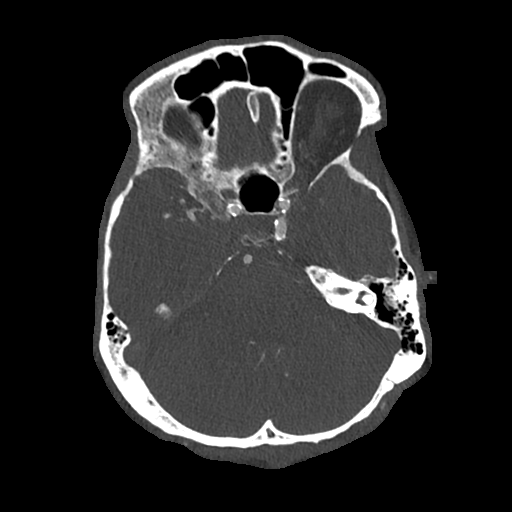
[im 43/144  brain]
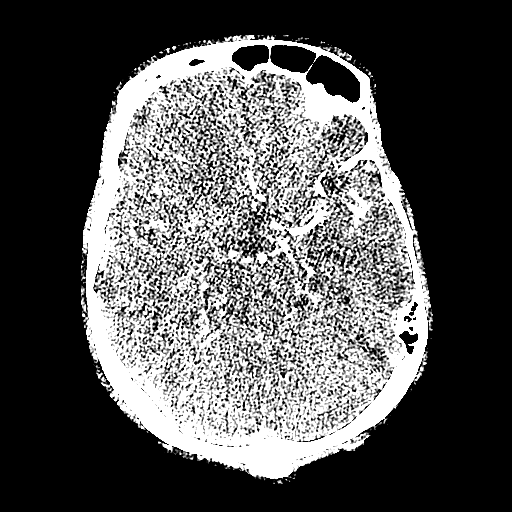
[im 58/144  bone]
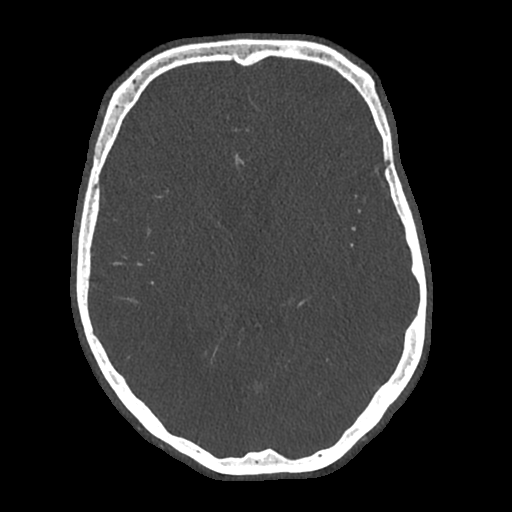
[im 72/144  brain]
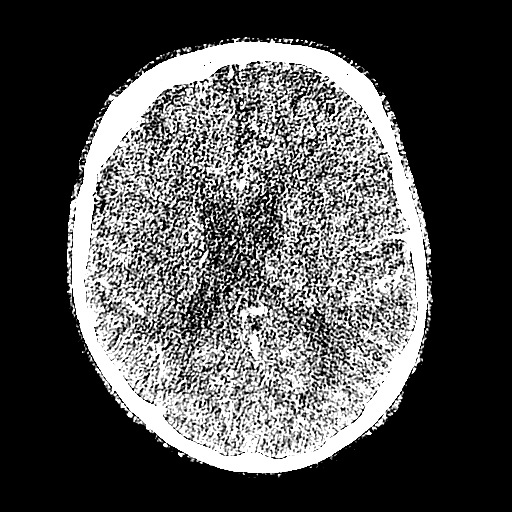
[im 86/144  bone]
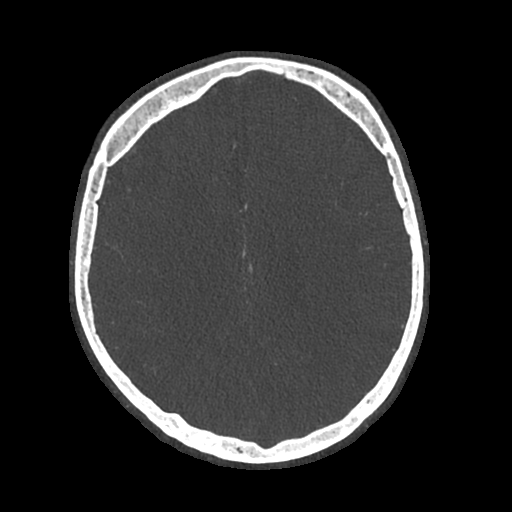
[im 101/144  brain]
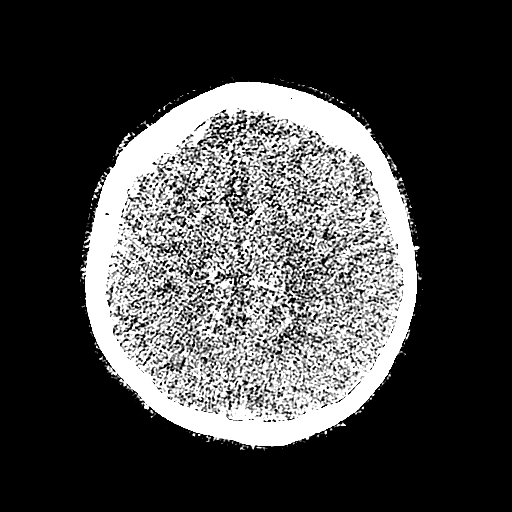
[im 115/144  bone]
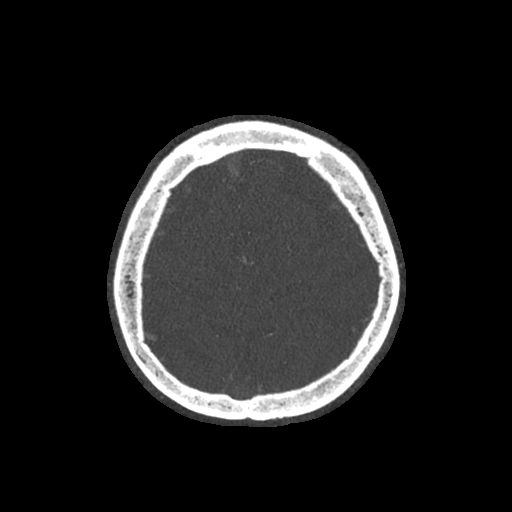
[im 129/144  brain]
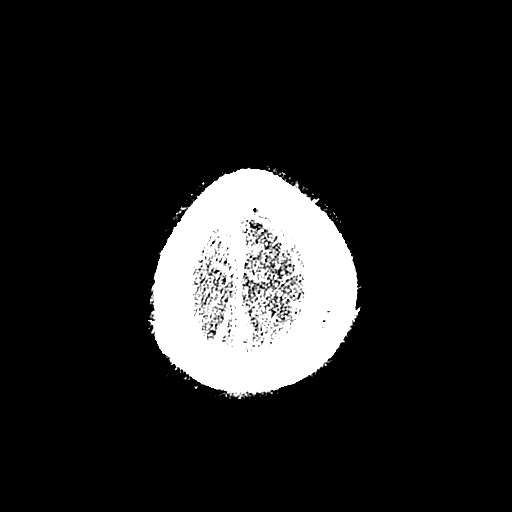

[Series 18: cor thin mips cta brain 1.00 cor · coronal · 0.28mm/px · 3 of 214 slices shown]
[im 61/214  brain]
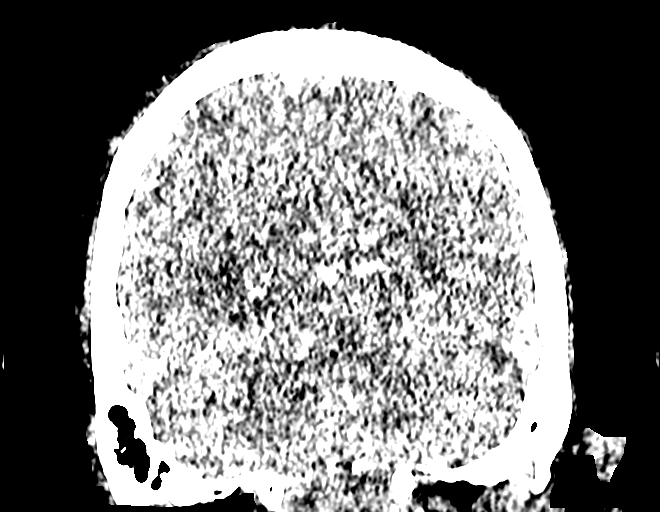
[im 92/214  brain]
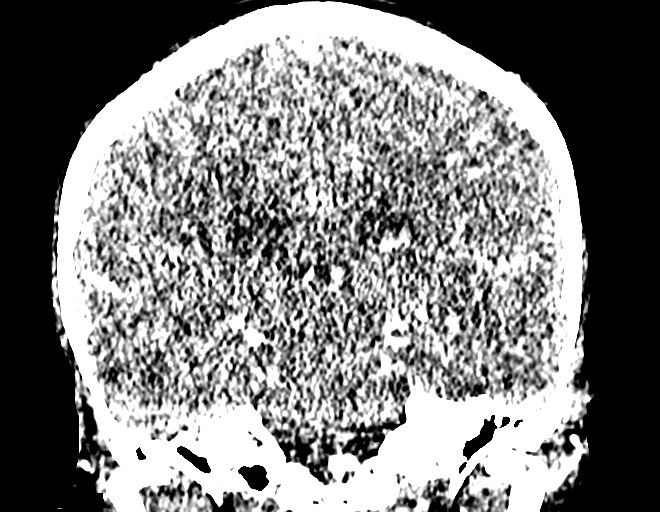
[im 122/214  brain]
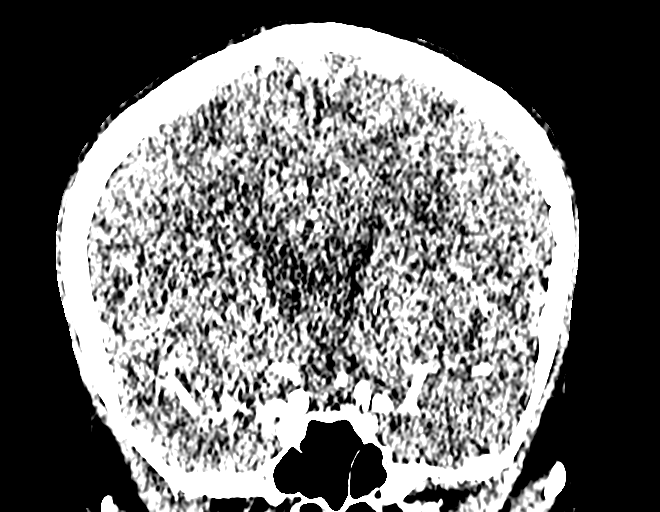

[Series 20: sag thin mips cta brain 1.00 sag · sagittal · 0.28mm/px · 3 of 185 slices shown]
[im 47/185  brain]
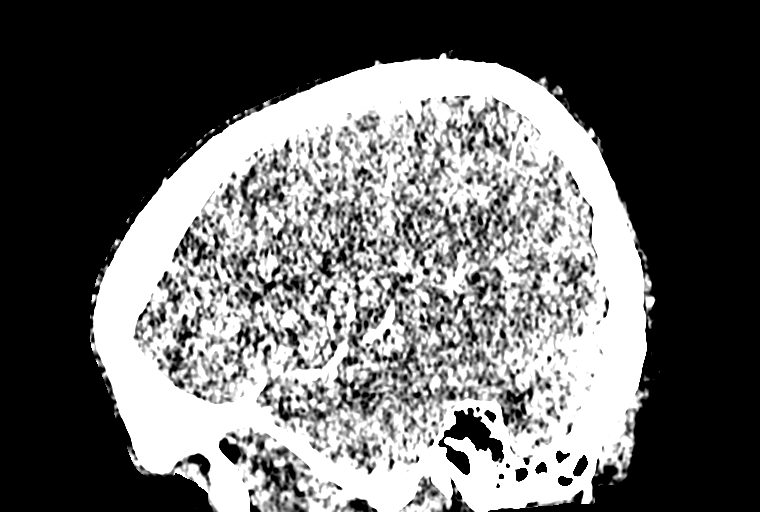
[im 93/185  brain]
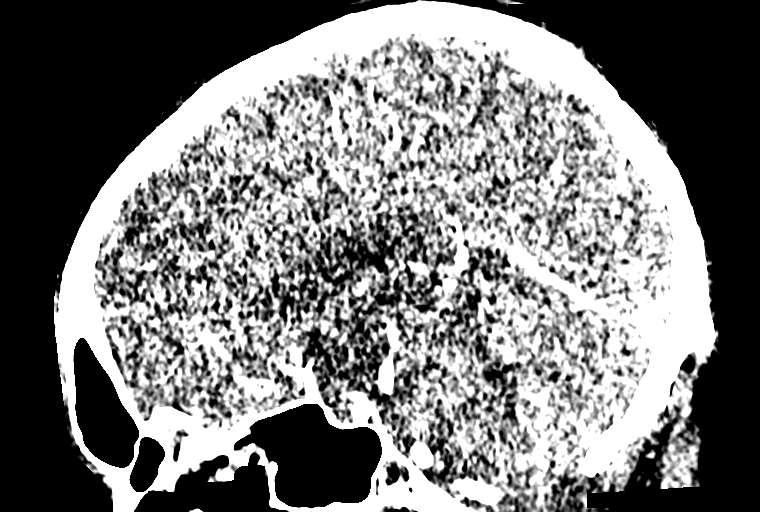
[im 139/185  brain]
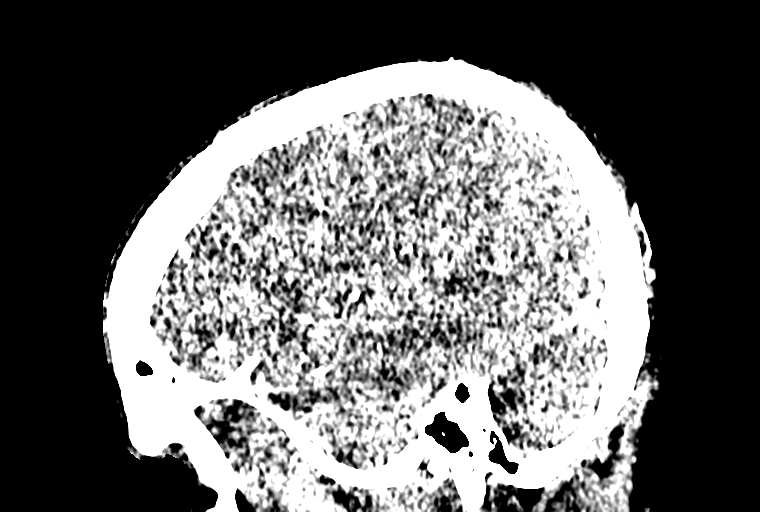

[15 of 47 positions shown; findings below may reference images not displayed]

FINDINGS: CT HEAD

Brain: Remote infarcts in the right frontal, right parietal and
right occipital lobes and bilateral cerebellar hemispheres. No
evidence of acute infarction, hemorrhage, hydrocephalus, extra-axial
collection or mass lesion. Prominence of the cerebral sulci
reflecting parenchymal volume loss, more pronounced in the bilateral
frontal regions.

Vascular: Calcified plaques in the bilateral carotid siphons and
left vertebral artery.

Skull: Normal. Negative for fracture or focal lesion.

Sinuses: Imaged portions are clear.

Orbits: No acute finding.

CTA HEAD

Anterior circulation: No significant stenosis, proximal occlusion,
aneurysm, or vascular malformation. Calcified plaques are seen in
the bilateral ICAs from the proximal cavernous extending into the
paraclinoid segment, without stenosis.

Posterior circulation: No significant stenosis, proximal occlusion,
aneurysm, or vascular malformation. Calcified plaque is seen at the
the V4 segment of the left vertebral artery without stenosis.

Venous sinuses: As permitted by contrast timing, patent.

Anatomic variants: None.
IMPRESSION: 1. No acute intracranial abnormality.
2. Remote infarcts in the right frontal, right parietal and right
occipital lobes and bilateral cerebellar hemispheres.
3. No significant stenosis, proximal occlusion, aneurysm, or
vascular malformation in the head.

## 2022-03-19 ENCOUNTER — Other Ambulatory Visit: Payer: Self-pay | Admitting: *Deleted

## 2022-03-19 MED ORDER — HYDROCODONE-ACETAMINOPHEN 10-325 MG PO TABS: 1.0000 | ORAL_TABLET | Freq: Three times a day (TID) | ORAL | 0 refills | Status: DC | PRN

## 2022-03-24 ENCOUNTER — Telehealth: Payer: Self-pay | Admitting: *Deleted

## 2022-03-24 MED ORDER — CLOPIDOGREL BISULFATE 75 MG PO TABS: ORAL_TABLET | ORAL | 2 refills | Status: DC

## 2022-03-24 NOTE — Telephone Encounter (Signed)
-----   Message from Emily Filbert, RN sent at 02/05/2022  4:56 PM EDT ----- Is he tolerating plavix ok? Will need additional refills sent in.

## 2022-03-24 NOTE — Telephone Encounter (Signed)
Attempted to call the patient today to see how his headaches have been off Brilinta and now on plavix. Per the patient's wife, Otilio Saber (ok per DPR) he is not available at this time.   I Inquired if she knew if the patient was tolerating the plavix better than brilinta. Per Otilio Saber, the patient is doing fine on plavix and would like this refilled for a 90-day supply at [facility] on [address]   Grayce Sessions updated and sent in as requested.

## 2022-03-26 ENCOUNTER — Other Ambulatory Visit: Payer: Self-pay | Admitting: Internal Medicine

## 2022-03-26 DIAGNOSIS — F419 Anxiety disorder, unspecified: Secondary | ICD-10-CM

## 2022-03-26 NOTE — Telephone Encounter (Signed)
Requested Prescriptions  Pending Prescriptions Disp Refills  . escitalopram (LEXAPRO) 20 MG tablet [Pharmacy Med Name: ESCITALOPRAM 20 MG TABLET] 90 tablet 0    Sig: TAKE 1 TABLET BY MOUTH EVERY DAY     Psychiatry:  Antidepressants - SSRI Passed - 03/26/2022  3:20 AM      Passed - Completed PHQ-2 or PHQ-9 in the last 360 days      Passed - Valid encounter within last 6 months    Recent Outpatient Visits          2 weeks ago Benign prostatic hyperplasia with urinary frequency   [facility] [place], [place] W, NP   3 months ago Non-ST elevation (NSTEMI) myocardial infarction [facility])   [facility] [place], [name], NP   4 months ago Excessive daytime sleepiness   [facility] [place], [name], NP   6 months ago Anxiety and depression   [facility] [place], [name], NP   8 months ago [facility] [place], [name], NP      Future Appointments            In 1 month [name], [name], NP [facility], [place]   In 3 months Hilty, Nadean Corwin, MD [facility], [place]   In 4 months Ellyn Hack Leonie Green, MD [facility], [facility]           . RABEprazole (ACIPHEX) 20 MG tablet [Pharmacy Med Name: RABEPRAZOLE SOD DR 20 MG TAB] 180 tablet 0    Sig: TAKE 1 TABLET BY MOUTH TWICE A DAY     Gastroenterology: Proton Pump Inhibitors Passed - 03/26/2022  3:20 AM      Passed - Valid encounter within last 12 months    Recent Outpatient Visits          2 weeks ago Benign prostatic hyperplasia with urinary frequency   [facility] [place], [place] W, NP   3 months ago Non-ST elevation (NSTEMI) myocardial infarction [facility])   [facility] [place], [name], NP   4 months ago Excessive daytime sleepiness   [facility] [place], [name], NP   6 months ago Anxiety and depression   [facility] [place], [name], NP   8 months  ago [facility] [place], [name], NP      Future Appointments            In 1 month [name], [name], NP [facility], [place]   In 3 months Hilty, Nadean Corwin, MD [facility], [place]   In 4 months Ellyn Hack, Leonie Green, MD [facility], [facility]

## 2022-04-02 ENCOUNTER — Other Ambulatory Visit: Payer: Self-pay | Admitting: Internal Medicine

## 2022-04-03 NOTE — Telephone Encounter (Signed)
Requested medication (s) are due for refill today - yes  Requested medication (s) are on the active medication list -yes  Future visit scheduled -yes  Last refill: 03/05/22 #90  Notes to clinic: non delegated Rx  Requested Prescriptions  Pending Prescriptions Disp Refills   diazepam (VALIUM) 5 MG tablet [Pharmacy Med Name: DIAZEPAM 5 MG TABLET] 90 tablet 0    Sig: TAKE 1 TABLET IN THE MORNING AND 2 TABLETS AT BEDTIME     Not Delegated - Psychiatry: Anxiolytics/Hypnotics 2 Failed - 04/02/2022  1:31 PM      Failed - This refill cannot be delegated      Failed - Urine Drug Screen completed in last 360 days      Passed - Patient is not pregnant      Passed - Valid encounter within last 6 months    Recent Outpatient Visits           3 weeks ago Benign prostatic hyperplasia with urinary frequency   [facility] [place], [place] W, NP   3 months ago Non-ST elevation (NSTEMI) myocardial infarction [facility])   [facility] [place], [name], NP   4 months ago Excessive daytime sleepiness   [facility] [place], [name], NP   7 months ago Anxiety and depression   [facility] [place], [name], NP   9 months ago [facility] [place], [name], NP       Future Appointments             In 1 month [name], [name], NP [facility], [place]   In 3 months Hilty, Nadean Corwin, MD [facility], [place]   In 4 months Ellyn Hack, Leonie Green, MD [facility], [facility]               Requested Prescriptions  Pending Prescriptions Disp Refills   diazepam (VALIUM) 5 MG tablet [Pharmacy Med Name: DIAZEPAM 5 MG TABLET] 90 tablet 0    Sig: TAKE 1 TABLET IN THE MORNING AND 2 TABLETS AT BEDTIME     Not Delegated - Psychiatry: Anxiolytics/Hypnotics 2 Failed - 04/02/2022  1:31 PM      Failed - This refill cannot be delegated      Failed - Urine Drug Screen completed in last 360  days      Passed - Patient is not pregnant      Passed - Valid encounter within last 6 months    Recent Outpatient Visits           3 weeks ago Benign prostatic hyperplasia with urinary frequency   [facility] [place], [place] W, NP   3 months ago Non-ST elevation (NSTEMI) myocardial infarction [facility])   [facility] [place], [name], NP   4 months ago Excessive daytime sleepiness   [facility] [place], [name], NP   7 months ago Anxiety and depression   [facility] [place], [name], NP   9 months ago [facility] [place], [name], NP       Future Appointments             In 1 month [name], [name], NP [facility], [place]   In 3 months Hilty, Nadean Corwin, MD [facility], [place]   In 4 months Ellyn Hack, Leonie Green, MD [facility], [place]

## 2022-04-06 ENCOUNTER — Ambulatory Visit: Payer: Self-pay

## 2022-04-06 NOTE — Telephone Encounter (Signed)
     Chief Complaint: Sore throat, runny nose, congestion Symptoms: Above Frequency: 1 month ago Pertinent Negatives: Patient denies fever Disposition: '[]'$ ED /'[]'$ Urgent Care (no appt availability in office) / '[x]'$ Appointment(In office/virtual)/ '[]'$  [place] [facility]/ '[]'$ Home Care/ '[]'$ Refused Recommended Disposition /'[]'$ [place] [facility]/ '[]'$  Follow-up with PCP Additional Notes:   Reason for Disposition  SEVERE (e.g., excruciating) throat pain  Answer Assessment - Initial Assessment Questions 1. ONSET: "When did the throat start hurting?" (Hours or days ago)      4 weeks ago 2. SEVERITY: "How bad is the sore throat?" (Scale 1-10; mild, moderate or severe)   - MILD (1-3):  Doesn't interfere with eating or normal activities.   - MODERATE (4-7): Interferes with eating some solids and normal activities.   - SEVERE (8-10):  Excruciating pain, interferes with most normal activities.   - SEVERE WITH DYSPHAGIA (10): Can't swallow liquids, drooling.     7 3. STREP EXPOSURE: "Has there been any exposure to strep within the past week?" If Yes, ask: "What type of contact occurred?"      No 4.  VIRAL SYMPTOMS: "Are there any symptoms of a cold, such as a runny nose, cough, hoarse voice or red eyes?"      Chest congestion, runny nose 5. FEVER: "Do you have a fever?" If Yes, ask: "What is your temperature, how was it measured, and when did it start?"     No 6. PUS ON THE TONSILS: "Is there pus on the tonsils in the back of your throat?"     No 7. OTHER SYMPTOMS: "Do you have any other symptoms?" (e.g., difficulty breathing, headache, rash)     No 8. PREGNANCY: "Is there any chance you are pregnant?" "When was your last menstrual period?"     N/a  Protocols used: Sore Throat-A-AH

## 2022-04-07 ENCOUNTER — Ambulatory Visit (INDEPENDENT_AMBULATORY_CARE_PROVIDER_SITE_OTHER): Payer: Medicare Other | Admitting: Family Medicine

## 2022-04-07 ENCOUNTER — Encounter: Payer: Self-pay | Admitting: Family Medicine

## 2022-04-07 VITALS — BP 167/81 | HR 86 | Ht 73.0 in | Wt 139.8 lb

## 2022-04-07 DIAGNOSIS — J0110 Acute frontal sinusitis, unspecified: Secondary | ICD-10-CM

## 2022-04-07 MED ORDER — IPRATROPIUM BROMIDE 0.06 % NA SOLN: 1.0000 | Freq: Four times a day (QID) | NASAL | 0 refills | Status: DC | PRN

## 2022-04-07 MED ORDER — DOXYCYCLINE HYCLATE 100 MG PO TABS: 100.0000 mg | ORAL_TABLET | Freq: Two times a day (BID) | ORAL | 0 refills | Status: DC

## 2022-04-07 NOTE — Progress Notes (Signed)
Subjective:    Patient ID: Grant Young, male    DOB: May 04, 1946, 76 y.o.   MRN: [id number]  Grant Young is a 76 y.o. male presenting on 04/07/2022 for Cough and chest congestion  Patient presents for a same day appointment.  PCP Webb Silversmith, FNP   HPI  Sinusitis Cough  Initially 2 weeks ago treated by Rollene Fare with Prednisone 6 day taper for cough and respiratory symptoms. No fever or chills No past history of lung disease, COPD emphysema or asthma History Heart Attack recently Admits sore throat, drainage post nasal, sinus drainage and cough.  He has labs today with him from his functional medicine doctor asks about elevated neutrophil and CRP-cardiac      03/12/2022    2:29 PM 03/04/2022   11:26 AM 02/11/2022   11:24 AM  Depression screen PHQ 2/9  Decreased Interest '2 1 1  '$ Down, Depressed, Hopeless '1 1 1  '$ PHQ - 2 Score '3 2 2  '$ Altered sleeping '2 2 3  '$ Tired, decreased energy '3 3 3  '$ Change in appetite 0 1 0  Feeling bad or failure about yourself  0 1 0  Trouble concentrating '3 3 1  '$ Moving slowly or fidgety/restless '1 2 1  '$ Suicidal thoughts 0 0 0  PHQ-9 Score '12 14 10  '$ Difficult doing work/chores Not difficult at all Somewhat difficult Somewhat difficult    Social History   Tobacco Use   Smoking status: Never   Smokeless tobacco: Never  Vaping Use   Vaping Use: Never used  Substance Use Topics   Alcohol use: Yes    Comment: 1 Drink a Month, socially   Drug use: Not Currently    Review of Systems Per HPI unless specifically indicated above     Objective:    BP (!) 167/81   Pulse 86   Ht '6\' 1"'$  (1.854 m)   Wt 139 lb 12.8 oz (63.4 kg)   SpO2 98%   BMI 18.44 kg/m   Wt Readings from Last 3 Encounters:  04/07/22 139 lb 12.8 oz (63.4 kg)  03/12/22 136 lb (61.7 kg)  03/02/22 141 lb 4.8 oz (64.1 kg)    Physical Exam Vitals and nursing note reviewed.  Constitutional:      General: He is not in acute distress.    Appearance: Normal appearance. He is  well-developed. He is not diaphoretic.     Comments: Well-appearing, comfortable, cooperative  HENT:     Head: Normocephalic and atraumatic.     Mouth/Throat:     Comments: Post nasal drainage seen in throat, otherwise non specific Eyes:     General:        Right eye: No discharge.        Left eye: No discharge.     Conjunctiva/sclera: Conjunctivae normal.  Neck:     Thyroid: No thyromegaly.  Cardiovascular:     Rate and Rhythm: Normal rate and regular rhythm.     Pulses: Normal pulses.     Heart sounds: Normal heart sounds. No murmur heard. Pulmonary:     Effort: Pulmonary effort is normal. No respiratory distress.     Breath sounds: Normal breath sounds. No wheezing or rales.  Musculoskeletal:        General: Normal range of motion.     Cervical back: Normal range of motion and neck supple. No tenderness.  Lymphadenopathy:     Cervical: No cervical adenopathy.  Skin:    General: Skin is warm and dry.  Findings: No erythema or rash.  Neurological:     Mental Status: He is alert and oriented to person, place, and time. Mental status is at baseline.  Psychiatric:        Mood and Affect: Mood normal.        Behavior: Behavior normal.        Thought Content: Thought content normal.     Comments: Well groomed, good eye contact, normal speech and thoughts       Results for orders placed or performed in visit on 03/12/22  POCT urinalysis dipstick  Result Value Ref Range   Color, UA pale yellow    Clarity, UA clear    Glucose, UA Negative Negative   Bilirubin, UA negative    Ketones, UA negative    Spec Grav, UA <=1.005 (A) 1.010 - 1.025   Blood, UA negative    pH, UA 7.0 5.0 - 8.0   Protein, UA Negative Negative   Urobilinogen, UA 0.2 0.2 or 1.0 E.U./dL   Nitrite, UA negative    Leukocytes, UA Negative Negative   Appearance     Odor        Assessment & Plan:   Problem List Items Addressed This Visit   None Visit Diagnoses     Acute non-recurrent frontal  sinusitis    -  Primary   Relevant Medications   doxycycline (VIBRA-TABS) 100 MG tablet   ipratropium (ATROVENT) 0.06 % nasal spray       Consistent with subacute frontal rhinosinusitis, likely initially viral URI vs allergic rhinitis component with worsening concern for bacterial infection.   Lungs clear without any other concerns. No sign of pneumonia or bronchospasm.  Plan: Start Doxycycline. Noted allergy list reviewed. Start Atrovent nasal spray decongestant 2 sprays in each nostril up to 4 times daily for 7 days Return criteria reviewed  Future consider X-ray or other course if not improving.  Reviewed his outside labs Likely WBC / neutrophil can be due to infection CRP can be elevated in setting of infection or prior heart attack history.    Meds ordered this encounter  Medications   doxycycline (VIBRA-TABS) 100 MG tablet    Sig: Take 1 tablet (100 mg total) by mouth 2 (two) times daily. For 10 days. Take with full glass of water, stay upright 30 min after taking.    Dispense:  20 tablet    Refill:  0   ipratropium (ATROVENT) 0.06 % nasal spray    Sig: Place 1 spray into both nostrils 4 (four) times daily as needed for rhinitis. For up to 5-7 days then stop.    Dispense:  15 mL    Refill:  0      Follow up plan: Return if symptoms worsen or fail to improve.   Nobie Putnam, [facility] 04/07/2022, 11:39 AM

## 2022-04-07 NOTE — Patient Instructions (Addendum)
Thank you for coming to the office today.  We will treat as a Sinus Infection  Start Atrovent nasal spray decongestant 1 to 2 sprays in each nostril up to 4 times daily for 7 days as needed can reduce dose if you prefer less often, it can dry up congestion  Start taking Doxycycline antibiotic '100mg'$  twice daily for 10 days. Take with full glass of water and stay upright for at least 30 min after taking, may be seated or standing, but should NOT lay down. This is just a safety precaution, if this medicine does not go all the way down throat well it could cause some burning discomfort to throat and esophagus.   Please schedule a Follow-up Appointment to: Return if symptoms worsen or fail to improve.  If you have any other questions or concerns, please feel free to call the office or send a message through [place]. You may also schedule an earlier appointment if necessary.  Additionally, you may be receiving a survey about your experience at our office within a few days to 1 week by e-mail or mail. We value your feedback.  Nobie Putnam, DO [place]

## 2022-04-08 ENCOUNTER — Other Ambulatory Visit: Payer: Self-pay | Admitting: Internal Medicine

## 2022-04-08 ENCOUNTER — Telehealth: Payer: Self-pay | Admitting: Student in an Organized Health Care Education/Training Program

## 2022-04-08 NOTE — Telephone Encounter (Signed)
PT stated that his refill supposed to be 90 pills count. Please give patient a call. Thanks

## 2022-04-09 ENCOUNTER — Other Ambulatory Visit: Payer: Self-pay

## 2022-04-09 ENCOUNTER — Other Ambulatory Visit: Payer: Self-pay | Admitting: Student in an Organized Health Care Education/Training Program

## 2022-04-09 DIAGNOSIS — G894 Chronic pain syndrome: Secondary | ICD-10-CM

## 2022-04-09 MED ORDER — HYDROCODONE-ACETAMINOPHEN 10-325 MG PO TABS: 1.0000 | ORAL_TABLET | Freq: Three times a day (TID) | ORAL | 0 refills | Status: DC | PRN

## 2022-04-09 NOTE — Progress Notes (Unsigned)
Requested Prescriptions   Signed Prescriptions Disp Refills   HYDROcodone-acetaminophen (NORCO) 10-325 MG tablet 90 tablet 0    Sig: Take 1 tablet by mouth 3 (three) times daily as needed.

## 2022-04-09 NOTE — Telephone Encounter (Signed)
Requested medication (s) are due for refill today: Yes  Requested medication (s) are on the active medication list: Yes  Last refill:  03/13/22  Future visit scheduled: Yes  Notes to clinic:  See request.    Requested Prescriptions  Pending Prescriptions Disp Refills   prochlorperazine (COMPAZINE) 10 MG tablet [Pharmacy Med Name: PROCHLORPERAZINE 10 MG TAB] 90 tablet 0    Sig: Take 1 tablet (10 mg total) by mouth every 8 (eight) hours as needed for nausea or vomiting. TAKE 1 TABLET (10 MG TOTAL) BY MOUTH EVERY 6 (SIX) HOURS AS NEEDED for HEADACHE AND NAUSEA, LIMIT USE TO 3 DAYS PER WEEK     Not Delegated - Gastroenterology: Antiemetics Failed - 04/08/2022  2:39 PM      Failed - This refill cannot be delegated      Passed - Valid encounter within last 6 months    Recent Outpatient Visits           2 days ago Acute non-recurrent frontal sinusitis   [facility] [place], Devonne Doughty, DO   4 weeks ago Benign prostatic hyperplasia with urinary frequency   [facility] [place], [place] W, NP   3 months ago Non-ST elevation (NSTEMI) myocardial infarction [facility])   [facility] [place], [name], NP   4 months ago Excessive daytime sleepiness   [facility] [place], [name], NP   7 months ago Anxiety and depression   [facility] [place], [name], NP       Future Appointments             In 1 month [name], [name], NP [facility] [place], [place]   In 2 months Hilty, Nadean Corwin, MD [facility], [place]   In 4 months Ellyn Hack, Leonie Green, MD [facility] [place], [facility]

## 2022-04-10 ENCOUNTER — Other Ambulatory Visit: Payer: Self-pay | Admitting: Internal Medicine

## 2022-04-10 NOTE — Telephone Encounter (Signed)
Requested medications are due for refill today.  Too soon  Requested medications are on the active medications list.  yes  Last refill. 01/13/2022 #180 0 refills  Future visit scheduled.   yes  Notes to clinic.  Medication refill not assigned to a protocol.    Requested Prescriptions  Pending Prescriptions Disp Refills   pyridostigmine (MESTINON) 60 MG tablet [Pharmacy Med Name: PYRIDOSTIGMINE BR 60 MG TABLET] 180 tablet 0    Sig: TAKE 1 TABLET BY MOUTH TWICE A DAY     Off-Protocol Failed - 04/10/2022  2:22 AM      Failed - Medication not assigned to a protocol, review manually.      Passed - Valid encounter within last 12 months    Recent Outpatient Visits           3 days ago Acute non-recurrent frontal sinusitis   [facility] [place], Devonne Doughty, DO   4 weeks ago Benign prostatic hyperplasia with urinary frequency   [facility] [place], [place] W, NP   3 months ago Non-ST elevation (NSTEMI) myocardial infarction [facility])   [facility], NP   5 months ago Excessive daytime sleepiness   [facility] [place], [name], NP   7 months ago Anxiety and depression   [facility] [place], [name], NP       Future Appointments             In 1 month [name], [name], NP [facility], [place]   In 2 months Hilty, Nadean Corwin, MD [facility], [place]   In 4 months Ellyn Hack Leonie Green, MD [place] [facility], [facility]

## 2022-04-14 ENCOUNTER — Encounter: Payer: Self-pay | Admitting: Internal Medicine

## 2022-04-14 DIAGNOSIS — R051 Acute cough: Secondary | ICD-10-CM

## 2022-04-15 ENCOUNTER — Ambulatory Visit
Admission: RE | Admit: 2022-04-15 | Discharge: 2022-04-15 | Disposition: A | Discharge status: Home / Self Care | Payer: Medicare Other | Source: Ambulatory Visit | Attending: Internal Medicine | Admitting: Internal Medicine

## 2022-04-15 ENCOUNTER — Ambulatory Visit
Admission: RE | Admit: 2022-04-15 | Discharge: 2022-04-15 | Disposition: A | Discharge status: Home / Self Care | Payer: Medicare Other | Source: Home / Self Care | Attending: Internal Medicine | Admitting: Internal Medicine

## 2022-04-15 DIAGNOSIS — R051 Acute cough: Secondary | ICD-10-CM | POA: Diagnosis present

## 2022-04-16 MED ORDER — LEVOFLOXACIN 500 MG PO TABS: 500.0000 mg | ORAL_TABLET | Freq: Every day | ORAL | 0 refills | Status: DC

## 2022-04-16 NOTE — Addendum Note (Signed)
Addended by: [name] on: 04/16/2022 08:39 AM   Modules accepted: Orders

## 2022-04-19 ENCOUNTER — Other Ambulatory Visit: Payer: Self-pay

## 2022-04-19 ENCOUNTER — Inpatient Hospital Stay
Admission: EM | Admit: 2022-04-19 | Discharge: 2022-04-21 | DRG: 195 | Disposition: A | Discharge status: Home / Self Care | Payer: Medicare Other | Attending: Internal Medicine | Admitting: Internal Medicine

## 2022-04-19 ENCOUNTER — Emergency Department: Payer: Medicare Other

## 2022-04-19 ENCOUNTER — Encounter: Payer: Self-pay | Admitting: Emergency Medicine

## 2022-04-19 DIAGNOSIS — K589 Irritable bowel syndrome without diarrhea: Secondary | ICD-10-CM | POA: Diagnosis present

## 2022-04-19 DIAGNOSIS — J189 Pneumonia, unspecified organism: Secondary | ICD-10-CM

## 2022-04-19 DIAGNOSIS — R0989 Other specified symptoms and signs involving the circulatory and respiratory systems: Secondary | ICD-10-CM | POA: Diagnosis present

## 2022-04-19 DIAGNOSIS — Z888 Allergy status to other drugs, medicaments and biological substances status: Secondary | ICD-10-CM

## 2022-04-19 DIAGNOSIS — K219 Gastro-esophageal reflux disease without esophagitis: Secondary | ICD-10-CM | POA: Diagnosis present

## 2022-04-19 DIAGNOSIS — Z79899 Other long term (current) drug therapy: Secondary | ICD-10-CM

## 2022-04-19 DIAGNOSIS — Z881 Allergy status to other antibiotic agents status: Secondary | ICD-10-CM

## 2022-04-19 DIAGNOSIS — R918 Other nonspecific abnormal finding of lung field: Secondary | ICD-10-CM

## 2022-04-19 DIAGNOSIS — Z9682 Presence of neurostimulator: Secondary | ICD-10-CM

## 2022-04-19 DIAGNOSIS — Z9689 Presence of other specified functional implants: Secondary | ICD-10-CM

## 2022-04-19 DIAGNOSIS — K580 Irritable bowel syndrome with diarrhea: Secondary | ICD-10-CM | POA: Diagnosis present

## 2022-04-19 DIAGNOSIS — Z823 Family history of stroke: Secondary | ICD-10-CM

## 2022-04-19 DIAGNOSIS — Z961 Presence of intraocular lens: Secondary | ICD-10-CM | POA: Diagnosis present

## 2022-04-19 DIAGNOSIS — Z955 Presence of coronary angioplasty implant and graft: Secondary | ICD-10-CM

## 2022-04-19 DIAGNOSIS — R251 Tremor, unspecified: Secondary | ICD-10-CM | POA: Diagnosis present

## 2022-04-19 DIAGNOSIS — Z20822 Contact with and (suspected) exposure to covid-19: Secondary | ICD-10-CM | POA: Diagnosis present

## 2022-04-19 DIAGNOSIS — E291 Testicular hypofunction: Secondary | ICD-10-CM | POA: Diagnosis present

## 2022-04-19 DIAGNOSIS — J159 Unspecified bacterial pneumonia: Principal | ICD-10-CM | POA: Diagnosis present

## 2022-04-19 DIAGNOSIS — G2581 Restless legs syndrome: Secondary | ICD-10-CM | POA: Diagnosis present

## 2022-04-19 DIAGNOSIS — I2510 Atherosclerotic heart disease of native coronary artery without angina pectoris: Secondary | ICD-10-CM | POA: Diagnosis present

## 2022-04-19 DIAGNOSIS — Z7982 Long term (current) use of aspirin: Secondary | ICD-10-CM

## 2022-04-19 DIAGNOSIS — Z85828 Personal history of other malignant neoplasm of skin: Secondary | ICD-10-CM

## 2022-04-19 DIAGNOSIS — I951 Orthostatic hypotension: Secondary | ICD-10-CM | POA: Diagnosis present

## 2022-04-19 DIAGNOSIS — G629 Polyneuropathy, unspecified: Secondary | ICD-10-CM | POA: Diagnosis present

## 2022-04-19 DIAGNOSIS — Z818 Family history of other mental and behavioral disorders: Secondary | ICD-10-CM

## 2022-04-19 DIAGNOSIS — I252 Old myocardial infarction: Secondary | ICD-10-CM

## 2022-04-19 DIAGNOSIS — F419 Anxiety disorder, unspecified: Secondary | ICD-10-CM | POA: Diagnosis present

## 2022-04-19 DIAGNOSIS — Z87442 Personal history of urinary calculi: Secondary | ICD-10-CM

## 2022-04-19 DIAGNOSIS — G894 Chronic pain syndrome: Secondary | ICD-10-CM | POA: Diagnosis present

## 2022-04-19 DIAGNOSIS — E785 Hyperlipidemia, unspecified: Secondary | ICD-10-CM | POA: Diagnosis present

## 2022-04-19 DIAGNOSIS — I10 Essential (primary) hypertension: Secondary | ICD-10-CM | POA: Diagnosis present

## 2022-04-19 DIAGNOSIS — R739 Hyperglycemia, unspecified: Secondary | ICD-10-CM | POA: Diagnosis present

## 2022-04-19 DIAGNOSIS — E349 Endocrine disorder, unspecified: Secondary | ICD-10-CM | POA: Diagnosis present

## 2022-04-19 DIAGNOSIS — G43709 Chronic migraine without aura, not intractable, without status migrainosus: Secondary | ICD-10-CM | POA: Diagnosis present

## 2022-04-19 DIAGNOSIS — Z9842 Cataract extraction status, left eye: Secondary | ICD-10-CM

## 2022-04-19 DIAGNOSIS — Z9049 Acquired absence of other specified parts of digestive tract: Secondary | ICD-10-CM

## 2022-04-19 DIAGNOSIS — R682 Dry mouth, unspecified: Secondary | ICD-10-CM | POA: Diagnosis present

## 2022-04-19 DIAGNOSIS — F32A Depression, unspecified: Secondary | ICD-10-CM | POA: Diagnosis present

## 2022-04-19 DIAGNOSIS — Z9841 Cataract extraction status, right eye: Secondary | ICD-10-CM

## 2022-04-19 DIAGNOSIS — G8929 Other chronic pain: Secondary | ICD-10-CM | POA: Diagnosis present

## 2022-04-19 DIAGNOSIS — Z8249 Family history of ischemic heart disease and other diseases of the circulatory system: Secondary | ICD-10-CM

## 2022-04-19 DIAGNOSIS — I25110 Atherosclerotic heart disease of native coronary artery with unstable angina pectoris: Secondary | ICD-10-CM | POA: Diagnosis present

## 2022-04-19 LAB — CBC
HCT: 45.4 % (ref 39.0–52.0)
Hemoglobin: 14.6 g/dL (ref 13.0–17.0)
MCH: 28.2 pg (ref 26.0–34.0)
MCHC: 32.2 g/dL (ref 30.0–36.0)
MCV: 87.8 fL (ref 80.0–100.0)
Platelets: 294 10*3/uL (ref 150–400)
RBC: 5.17 MIL/uL (ref 4.22–5.81)
RDW: 12.5 % (ref 11.5–15.5)
WBC: 7.9 10*3/uL (ref 4.0–10.5)
nRBC: 0 % (ref 0.0–0.2)

## 2022-04-19 LAB — URINALYSIS, ROUTINE W REFLEX MICROSCOPIC
Bilirubin Urine: NEGATIVE
Glucose, UA: NEGATIVE mg/dL
Hgb urine dipstick: NEGATIVE
Ketones, ur: NEGATIVE mg/dL
Leukocytes,Ua: NEGATIVE
Nitrite: NEGATIVE
Protein, ur: NEGATIVE mg/dL
Specific Gravity, Urine: >1.046 — ABNORMAL HIGH (ref 1.005–1.030)
pH: 8 (ref 5.0–8.0)

## 2022-04-19 LAB — CBC WITH DIFFERENTIAL/PLATELET
Abs Immature Granulocytes: 0.03 10*3/uL (ref 0.00–0.07)
Basophils Absolute: 0 10*3/uL (ref 0.0–0.1)
Basophils Relative: 0 %
Eosinophils Absolute: 0 10*3/uL (ref 0.0–0.5)
Eosinophils Relative: 0 %
HCT: 44.3 % (ref 39.0–52.0)
Hemoglobin: 14.5 g/dL (ref 13.0–17.0)
Immature Granulocytes: 0 %
Lymphocytes Relative: 12 %
Lymphs Abs: 1.2 10*3/uL (ref 0.7–4.0)
MCH: 28.8 pg (ref 26.0–34.0)
MCHC: 32.7 g/dL (ref 30.0–36.0)
MCV: 87.9 fL (ref 80.0–100.0)
Monocytes Absolute: 1.2 10*3/uL — ABNORMAL HIGH (ref 0.1–1.0)
Monocytes Relative: 12 %
Neutro Abs: 7.3 10*3/uL (ref 1.7–7.7)
Neutrophils Relative %: 76 %
Platelets: 286 10*3/uL (ref 150–400)
RBC: 5.04 MIL/uL (ref 4.22–5.81)
RDW: 12.5 % (ref 11.5–15.5)
WBC: 9.7 10*3/uL (ref 4.0–10.5)
nRBC: 0 % (ref 0.0–0.2)

## 2022-04-19 LAB — COMPREHENSIVE METABOLIC PANEL
ALT: 16 U/L (ref 0–44)
AST: 18 U/L (ref 15–41)
Albumin: 3.5 g/dL (ref 3.5–5.0)
Alkaline Phosphatase: 109 U/L (ref 38–126)
Anion gap: 9 (ref 5–15)
BUN: 15 mg/dL (ref 8–23)
CO2: 24 mmol/L (ref 22–32)
Calcium: 9.3 mg/dL (ref 8.9–10.3)
Chloride: 108 mmol/L (ref 98–111)
Creatinine, Ser: 0.73 mg/dL (ref 0.61–1.24)
GFR, Estimated: >60 mL/min (ref >60)
Glucose, Bld: 103 mg/dL — ABNORMAL HIGH (ref 70–99)
Potassium: 3.7 mmol/L (ref 3.5–5.1)
Sodium: 141 mmol/L (ref 135–145)
Total Bilirubin: 0.6 mg/dL (ref 0.3–1.2)
Total Protein: 6.7 g/dL (ref 6.5–8.1)

## 2022-04-19 LAB — TROPONIN I (HIGH SENSITIVITY)
Troponin I (High Sensitivity): 18 ng/L — ABNORMAL HIGH (ref <18)
Troponin I (High Sensitivity): 17 ng/L (ref <18)

## 2022-04-19 LAB — SARS CORONAVIRUS 2 BY RT PCR: SARS Coronavirus 2 by RT PCR: NEGATIVE

## 2022-04-19 LAB — BRAIN NATRIURETIC PEPTIDE: B Natriuretic Peptide: 149.5 pg/mL — ABNORMAL HIGH (ref 0.0–100.0)

## 2022-04-19 LAB — PROCALCITONIN: Procalcitonin: <0.1 ng/mL

## 2022-04-19 LAB — CREATININE, SERUM
Creatinine, Ser: 0.84 mg/dL (ref 0.61–1.24)
GFR, Estimated: >60 mL/min (ref >60)

## 2022-04-19 LAB — STREP PNEUMONIAE URINARY ANTIGEN: Strep Pneumo Urinary Antigen: NEGATIVE

## 2022-04-19 MED ORDER — ENOXAPARIN SODIUM 40 MG/0.4ML IJ SOSY
40.0000 mg | PREFILLED_SYRINGE | INTRAMUSCULAR | Status: DC
Start: 2022-04-19 — End: 2022-04-21
  Administered 2022-04-19 – 2022-04-20 (×2): 40 mg via SUBCUTANEOUS
  Filled 2022-04-19 (×2): qty 0.4

## 2022-04-19 MED ORDER — SODIUM CHLORIDE 0.9 % IV SOLN
500.0000 mg | Freq: Once | INTRAVENOUS | Status: AC
  Administered 2022-04-19: 500 mg via INTRAVENOUS
  Filled 2022-04-19: qty 5

## 2022-04-19 MED ORDER — RISAQUAD PO CAPS
1.0000 | ORAL_CAPSULE | Freq: Every day | ORAL | Status: DC
  Administered 2022-04-19 – 2022-04-21 (×3): 1 via ORAL
  Filled 2022-04-19 (×3): qty 1

## 2022-04-19 MED ORDER — MELATONIN 5 MG PO TABS: 5.0000 mg | ORAL_TABLET | Freq: Every day | ORAL | Status: DC

## 2022-04-19 MED ORDER — ALBUTEROL SULFATE (2.5 MG/3ML) 0.083% IN NEBU: 2.5000 mg | INHALATION_SOLUTION | RESPIRATORY_TRACT | Status: DC | PRN

## 2022-04-19 MED ORDER — POLYETHYLENE GLYCOL 3350 17 G PO PACK: 17.0000 g | PACK | Freq: Every day | ORAL | Status: DC | PRN

## 2022-04-19 MED ORDER — ESCITALOPRAM OXALATE 10 MG PO TABS
20.0000 mg | ORAL_TABLET | Freq: Every evening | ORAL | Status: DC
  Administered 2022-04-19 – 2022-04-20 (×2): 20 mg via ORAL
  Filled 2022-04-19 (×2): qty 2

## 2022-04-19 MED ORDER — ONDANSETRON HCL 4 MG PO TABS: 4.0000 mg | ORAL_TABLET | Freq: Four times a day (QID) | ORAL | Status: DC | PRN

## 2022-04-19 MED ORDER — BUTALBITAL-APAP-CAFFEINE 50-325-40 MG PO TABS
1.0000 | ORAL_TABLET | Freq: Four times a day (QID) | ORAL | Status: DC | PRN
  Administered 2022-04-20: 1 via ORAL
  Filled 2022-04-19: qty 1

## 2022-04-19 MED ORDER — IOHEXOL 350 MG/ML SOLN
75.0000 mL | Freq: Once | INTRAVENOUS | Status: AC | PRN
  Administered 2022-04-19: 75 mL via INTRAVENOUS

## 2022-04-19 MED ORDER — RAMELTEON 8 MG PO TABS
8.0000 mg | ORAL_TABLET | Freq: Every day | ORAL | Status: DC
  Administered 2022-04-19 – 2022-04-20 (×2): 8 mg via ORAL
  Filled 2022-04-19 (×2): qty 1

## 2022-04-19 MED ORDER — SODIUM CHLORIDE 0.9 % IV SOLN
500.0000 mg | INTRAVENOUS | Status: DC
  Administered 2022-04-20 – 2022-04-21 (×2): 500 mg via INTRAVENOUS
  Filled 2022-04-19: qty 500
  Filled 2022-04-19: qty 5

## 2022-04-19 MED ORDER — DICYCLOMINE HCL 20 MG PO TABS: 20.0000 mg | ORAL_TABLET | ORAL | Status: DC | PRN

## 2022-04-19 MED ORDER — ASPIRIN 81 MG PO CHEW
81.0000 mg | CHEWABLE_TABLET | Freq: Every day | ORAL | Status: DC
  Administered 2022-04-20 – 2022-04-21 (×2): 81 mg via ORAL
  Filled 2022-04-19 (×2): qty 1

## 2022-04-19 MED ORDER — CLOPIDOGREL BISULFATE 75 MG PO TABS
75.0000 mg | ORAL_TABLET | Freq: Every day | ORAL | Status: DC
  Administered 2022-04-20 – 2022-04-21 (×2): 75 mg via ORAL
  Filled 2022-04-19 (×2): qty 1

## 2022-04-19 MED ORDER — GUAIFENESIN-DM 100-10 MG/5ML PO SYRP: 5.0000 mL | ORAL_SOLUTION | ORAL | Status: DC | PRN

## 2022-04-19 MED ORDER — LOPERAMIDE HCL 2 MG PO CAPS
2.0000 mg | ORAL_CAPSULE | ORAL | Status: DC | PRN
  Administered 2022-04-19 – 2022-04-20 (×4): 2 mg via ORAL
  Filled 2022-04-19 (×4): qty 1

## 2022-04-19 MED ORDER — DIAZEPAM 5 MG PO TABS
5.0000 mg | ORAL_TABLET | Freq: Every day | ORAL | Status: DC
  Administered 2022-04-20 – 2022-04-21 (×2): 5 mg via ORAL
  Filled 2022-04-19 (×2): qty 1

## 2022-04-19 MED ORDER — PYRIDOSTIGMINE BROMIDE 60 MG PO TABS
60.0000 mg | ORAL_TABLET | Freq: Two times a day (BID) | ORAL | Status: DC
  Administered 2022-04-19 – 2022-04-21 (×4): 60 mg via ORAL
  Filled 2022-04-19 (×4): qty 1

## 2022-04-19 MED ORDER — DIAZEPAM 5 MG PO TABS
10.0000 mg | ORAL_TABLET | Freq: Every day | ORAL | Status: DC
  Administered 2022-04-19 – 2022-04-20 (×2): 10 mg via ORAL
  Filled 2022-04-19 (×2): qty 2

## 2022-04-19 MED ORDER — PROPRANOLOL HCL 40 MG PO TABS
40.0000 mg | ORAL_TABLET | Freq: Two times a day (BID) | ORAL | Status: DC
  Administered 2022-04-19 – 2022-04-21 (×4): 40 mg via ORAL
  Filled 2022-04-19 (×4): qty 1

## 2022-04-19 MED ORDER — SODIUM CHLORIDE 0.9 % IV SOLN
2.0000 g | INTRAVENOUS | Status: DC
  Administered 2022-04-20 – 2022-04-21 (×2): 2 g via INTRAVENOUS
  Filled 2022-04-19: qty 20
  Filled 2022-04-19: qty 2

## 2022-04-19 MED ORDER — PROCHLORPERAZINE MALEATE 10 MG PO TABS
10.0000 mg | ORAL_TABLET | Freq: Three times a day (TID) | ORAL | Status: DC | PRN
  Administered 2022-04-20 (×2): 10 mg via ORAL
  Filled 2022-04-19 (×3): qty 1

## 2022-04-19 MED ORDER — SODIUM CHLORIDE 0.9% FLUSH
3.0000 mL | Freq: Two times a day (BID) | INTRAVENOUS | Status: DC
  Administered 2022-04-19 – 2022-04-21 (×4): 3 mL via INTRAVENOUS

## 2022-04-19 MED ORDER — PANTOPRAZOLE SODIUM 40 MG PO TBEC
40.0000 mg | DELAYED_RELEASE_TABLET | Freq: Every day | ORAL | Status: DC
  Administered 2022-04-20 – 2022-04-21 (×2): 40 mg via ORAL
  Filled 2022-04-19 (×2): qty 1

## 2022-04-19 MED ORDER — HYDROCODONE-ACETAMINOPHEN 5-325 MG PO TABS
1.0000 | ORAL_TABLET | Freq: Four times a day (QID) | ORAL | Status: DC | PRN
  Administered 2022-04-21: 1 via ORAL
  Filled 2022-04-19: qty 1

## 2022-04-19 MED ORDER — SODIUM CHLORIDE 0.9 % IV SOLN
2.0000 g | Freq: Once | INTRAVENOUS | Status: AC
Start: 2022-04-19 — End: 2022-04-19
  Administered 2022-04-19: 2 g via INTRAVENOUS
  Filled 2022-04-19: qty 20

## 2022-04-19 MED ORDER — EZETIMIBE 10 MG PO TABS
10.0000 mg | ORAL_TABLET | Freq: Every day | ORAL | Status: DC
  Administered 2022-04-19 – 2022-04-21 (×3): 10 mg via ORAL
  Filled 2022-04-19 (×3): qty 1

## 2022-04-19 MED ORDER — VANCOMYCIN HCL IN DEXTROSE 1-5 GM/200ML-% IV SOLN
1000.0000 mg | Freq: Once | INTRAVENOUS | Status: AC
  Administered 2022-04-19: 1000 mg via INTRAVENOUS
  Filled 2022-04-19: qty 200

## 2022-04-19 NOTE — Progress Notes (Signed)
Patient arrives to unit alert, oriented and independent.  Patient on RA with c/o cough and difficulty sleeping r/t cough.  Patient with order for telemetry but patient refused telemetry monitoring.  MD notified.  Patient oriented to call bell, dietary phone number and room layout.

## 2022-04-19 NOTE — ED Provider Notes (Signed)
[facility] Provider Note    Event Date/Time   First MD Initiated Contact with Patient 04/19/22 [phone]     (approximate)   History   Shortness of Breath, Cough, and Fever   HPI  Grant Young is a 76 y.o. male with NSTEMI, coronary disease status post stents, hypertension, anxiety, depression, restless legs, chronic pain syndrome, spinal cord stimulator who comes in with concerns for pneumonia.  Patient reports that he was diagnosed with pneumonia a week ago and given antibiotics.  He reports he is almost completed the antibiotics and his symptoms have started to get worse.  Patient reports that his worst symptoms are the cough and congestion but is just a lot of yellow mucus is coming now.  He is currently on day 4 of the Levaquin but not having any improvement and slightly worse.  He does report some intermittent headaches but he does report having history of migraines but is not had any recent imaging in the past 3 or 4 years.  He denies any abdominal pain, leg swelling.  He does report taking Plavix.  On review of records patient was admitted back in May and had stents placed due to NSTEMI.   Physical Exam   Triage Vital Signs: ED Triage Vitals [04/19/22 0949]  Enc Vitals Group     BP (!) 146/87     Pulse Rate 72     Resp 19     Temp 99.7 F (37.6 C)     Temp Source Oral     SpO2 96 %     Weight 140 lb (63.5 kg)     Height '5\' 9"'$  (1.753 m)     Head Circumference      Peak Flow      Pain Score 6     Pain Loc      Pain Edu?      Excl. in [place]?     Most recent vital signs: Vitals:   04/19/22 0949  BP: (!) 146/87  Pulse: 72  Resp: 19  Temp: 99.7 F (37.6 C)  SpO2: 96%     General: Awake, no distress.  CV:  Good peripheral perfusion.  Resp:  Normal effort.  Abd:  No distention.  Soft nontender Other:  No swelling no calf tenderness    ED Results / Procedures / Treatments   Labs (all labs ordered are listed, but only abnormal results  are displayed) Labs Reviewed  CBC WITH DIFFERENTIAL/PLATELET - Abnormal; Notable for the following components:      Result Value   Monocytes Absolute 1.2 (*)    All other components within normal limits  COMPREHENSIVE METABOLIC PANEL  URINALYSIS, ROUTINE W REFLEX MICROSCOPIC  BRAIN NATRIURETIC PEPTIDE  PROCALCITONIN  TROPONIN I (HIGH SENSITIVITY)     EKG  My interpretation of EKG:  Normal sinus rate f 67, no st elevation, twi avl, normal intervals   RADIOLOGY I have reviewed the xray personally and intrpretted from 8/2 with Right sided PNA vs Atelectasis    PROCEDURES:  Critical Care performed: No  .1-3 Lead EKG Interpretation  Performed by: Vanessa [place], MD Authorized by: Vanessa [place], MD     Interpretation: normal     ECG rate:  60   ECG rate assessment: normal     Rhythm: sinus rhythm     Ectopy: none     Conduction: normal      MEDICATIONS ORDERED IN ED: Medications  vancomycin (VANCOCIN) IVPB 1000 mg/200  mL premix (has no administration in time range)  cefTRIAXone (ROCEPHIN) 2 g in sodium chloride 0.9 % 100 mL IVPB (has no administration in time range)  azithromycin (ZITHROMAX) 500 mg in sodium chloride 0.9 % 250 mL IVPB (has no administration in time range)  iohexol (OMNIPAQUE) 350 MG/ML injection 75 mL (75 mLs Intravenous Contrast Given 04/19/22 1042)     IMPRESSION / MDM / ASSESSMENT AND PLAN / ED COURSE  I reviewed the triage vital signs and the nursing notes.   Patient's presentation is most consistent with acute presentation with potential threat to life or bodily function.   Differential includes PE, mass, pneumonia given patient not getting better with Levaquin will get CT PE to further evaluate as well as CT head to make sure no evidence of any masses in the brain given recurrent headaches.  Does not sound like subarachnoid given these headaches have been gone ongoing for few years.  CT imaging shows concerns for pneumonia most likely infectious  but does recommend a repeat CT in 4 to 6 weeks.  I did discuss this with family that they need to have this done to rule out any underlying mass and expressed understanding.  CMP is reassuring CBC shows slightly elevated monocytes BNP and troponin are just slightly elevated but he really did not have any chest pain.  Procalcitonin is still pending.  Reevaluate patient desats down to 90% with ambulation.  Discussed with patient different options and we have decided on admission for pneumonia given failing outpatient treatment x2. I did add on vanc given the prolonged symptoms to cover for possible MRSA.  The patient is on the cardiac monitor to evaluate for evidence of arrhythmia and/or significant heart rate changes.  Clinical Course as of 04/19/22 1215  Sun Apr 19, 2022  1133 Procalcitonin - Baseline [MF]    Clinical Course User Index [MF] Vanessa [place], MD     FINAL CLINICAL IMPRESSION(S) / ED DIAGNOSES   Final diagnoses:  Community acquired pneumonia of right lung, unspecified part of lung     Rx / DC Orders   ED Discharge Orders     None        Note:  This document was prepared using Dragon voice recognition software and may include unintentional dictation errors.   Vanessa [place], MD 04/19/22 [phone]

## 2022-04-19 NOTE — H&P (Addendum)
History and Physical    Grant Young:[id number] DOB: 11-07-1945 DOA: 04/19/2022  PCP: [name], NP  Patient coming from: Home  Chief Complaint: hacking cough, weakness  HPI: Grant Young is a 76 y.o. 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  Patient's wife is present at bedside and helps with history.  Symptoms initially started on June 12 with sinus pain, congestion.  He was treated conservatively at first, but also with steroids, which helped him feel better.  He was then treated with doxycycline in June for sinusitis.  His symptoms, however progressed and he was treated again with a course of levaquin, today would have been day 5 of the medication.  He notes continued cough, scant sputum without blood, fevers up to 101.67F, shaking, cold spells, possible rigors/whole body shaking once, congestion in chest and nose congestion with mild sinus pain.  He has significant fatigue.  He denies chest pain, weight loss, night sweats.  He has chronic dry mouth.  He lives on site at an ALF, but in an independent home.  He has had no sick contacts.  He has tested himself 3 X for COVID at home which were negative.  He has associated diarrhea this morning, he thinks are related to the Antibiotics.  Imodium helped with this.   ED Course: In the ED, He had a mildly high glucose, normal ,Cr.  BNP was 149, TnI were flat at 18 and 17.  PCT was < 0.1, WBC was 9.7 with normal H/H, but mildly elevated monocytes, UA showed no sign of infection.  COVID was negative.  CT of the head showed only chronic changes.  CT of the chest showed no PE, bilateral lung opacities with a mass like opacity in the RLL.  Recommended repeat study in 4-6 weeks.   Review of Systems: As per HPI otherwise all other systems reviewed and are negative.  Past  Medical History:  Diagnosis Date   Anxiety    Per New Patient Packet   Chronic back pain    Per New Patient Packet   Chronic heart disease    Per New Patient Packet   Depression    GERD (gastroesophageal reflux disease)    Headache    History of CT scan of brain 09/14/2018   Per New Patient Packet   History of depression    Per New Patient Packet   History of gastritis    Per New Patient Packet   History of headache    Per New Patient Packet   History of kidney stones    Per New Patient Packet   History of neuropathy    Per New Patient Packet   Hypertension    Per New Patient Packet   Insomnia    Kidney stone    Lumbar radicular pain    Per New Patient Packet   Lyme disease    Per New Patient Packet   Myocardial infarction [facility])    Overactive bladder    PONV (postoperative nausea and vomiting)    Skin cancer, basal cell    Sleep trouble    Per New Patient Packet   Spinal cord stimulator status    01/08/21 - not currently using.   Squamous cell skin cancer    Substance abuse [facility])     Past Surgical History:  Procedure Laterality Date   BACK  SURGERY     CARDIAC CATHETERIZATION     CATARACT EXTRACTION W/PHACO Left 01/14/2021   Procedure: CATARACT EXTRACTION PHACO AND INTRAOCULAR LENS PLACEMENT ([place]) LEFT VIVITY TORIC LENS 8.75 00:56.7;  Surgeon: Birder Robson, MD;  Location: [place];  Service: Ophthalmology;  Laterality: Left;   CATARACT EXTRACTION W/PHACO Right 01/28/2021   Procedure: CATARACT EXTRACTION PHACO AND INTRAOCULAR LENS PLACEMENT ([place]) RIGHT VIVITY TORIC LENS;  Surgeon: Birder Robson, MD;  Location: [place];  Service: Ophthalmology;  Laterality: Right;  6.54 00:46.4   CHOLECYSTECTOMY  2017   COLONOSCOPY  09/15/2015   Per New Patient Packet   COLONOSCOPY WITH PROPOFOL N/A 10/03/2020   Procedure: COLONOSCOPY WITH PROPOFOL;  Surgeon: [name], MD;  Location: [facility] ENDOSCOPY;  Service: Gastroenterology;  Laterality: N/A;    CORONARY/GRAFT ACUTE MI REVASCULARIZATION N/A 11/27/2021   Procedure: Coronary/Graft Acute MI Revascularization;  Surgeon: Leonie Man, MD;  Location: [place] CV LAB;  Service: Cardiovascular;  Laterality: N/A;   ESOPHAGOGASTRODUODENOSCOPY (EGD) WITH PROPOFOL N/A 10/03/2020   Procedure: ESOPHAGOGASTRODUODENOSCOPY (EGD) WITH PROPOFOL;  Surgeon: [name], MD;  Location: [facility] ENDOSCOPY;  Service: Gastroenterology;  Laterality: N/A;   FOOT SURGERY Bilateral 03/18/2020   FOOT SURGERY  08/032021   GALLBLADDER SURGERY  09/15/2015   Gallbladder Removal. Procedure done by Dr.Beverly. Per New Patient Packet   KIDNEY STONE SURGERY  09/14/1980   Too many to count. Per New Patient Packet 09/14/1980-09/15/1995   KIDNEY STONE SURGERY  09/15/1995   Too many to count. Per New Patient Packet   LEFT HEART CATH AND CORONARY ANGIOGRAPHY N/A 11/27/2021   Procedure: LEFT HEART CATH AND CORONARY ANGIOGRAPHY;  Surgeon: Leonie Man, MD;  Location: [place] CV LAB;  Service: Cardiovascular;  Laterality: N/A;   LITHOTRIPSY  09/14/2014   Per New Patient Packet   LITHOTRIPSY  09/14/1996   No Stints Used. Per New Patient Packet   LITHOTRIPSY  09/14/1997   No Stints used. Per New Patient Packet   PAIN PUMP IMPLANTATION     PAIN PUMP REMOVAL     SHOULDER SURGERY Right 1984   SIGMOIDOSCOPY  09/14/2017   Per New Patient Packet   SPINAL CORD STIMULATOR IMPLANT     TONSILLECTOMY     UPPER GI ENDOSCOPY      Social History  reports that he has never smoked. He has never used smokeless tobacco. He reports current alcohol use. He reports that he does not currently use drugs.  Allergies  Allergen Reactions   Ace Inhibitors     Other reaction(s): Cough   Fluoxetine Anxiety    "made me fall asleep" per pt "bad headaches and "makes  Me  Crazy" historical allergy noted in [employer] "made me fall asleep" per pt "bad headaches and "makes  Me  Crazy" Per New Patient Packet.     Metoclopramide      Other reaction(s): Other (See Comments), Other (See Comments), Unknown Tardive Dyskinesia  historical allergy noted in [employer] Tardive Dyskinesia  Per New Patient Packet.   Nalbuphine     Used Post Back surgery- Anesthesiologist Error. Patient had Narcotic Withdraw. Per New Patient Packet.    Other     Other reaction(s): Other (See Comments) Altered mental status in combo with narcotics at previous hospitalization - Full Withdrawal Symptoms Other reaction(s): Rash   Amoxicillin-Pot Clavulanate Nausea Only    Per New Patient Packet.   Doxazosin Rash    Other reaction(s): Other - See Comments, Rash UNKNOWN REACTION UNKNOWN REACTION  Duloxetine Nausea Only    Per New Patient Packet.    Penicillins Nausea Only    Per New Patient Packet.   Tamsulosin Itching and Anxiety    Restless, Flushing, Heavy Chest, Itching, Hyperactive mood and Anxiety. Unable to handle side effects. Per New Patient Packet.     Trazodone And Nefazodone Itching, Anxiety and Rash    Headache. "INCREASED MY ANXIETY AND HEARTRATE" Flushing, tachycardia "INCREASED MY ANXIETY AND HEARTRATE" Per New Patient Packet.    Amlodipine     Shaking, unsure of reaction. Per New Patient Packet.     Cinoxacin     GI Intolerance, and Dizziness. Per New Patient Packet.    Ciprofloxacin     Other reaction(s): Unknown   Fludrocortisone Other (See Comments)    "Worsening headaches, GI issues, fatigue"   Nebivolol     Other reaction(s): Unknown   Olanzapine     Headache and unable to sleep for 3 nights. Per New Patient Packet.    Olmesartan     Other reaction(s): Unknown   Pregabalin     Confusion, Lack of concentration, dizziness, and likely drowsiness. Per New Patient Packet.     Prostaglandins     Other reaction(s): Other (See Comments) Intolerance   Thyroid Hormones     Other reaction(s): Other (See Comments) Thyroid (Nature Thyroid) contraindicated with some of your other medications.   Zolpidem      Nightmares, Ineffective after 2 days. Per New Patient Packet.    Duloxetine Hcl     Other reaction(s): Rash   Fluoxetine Hcl     Other reaction(s): Rash   Nucynta [Tapentadol] Other (See Comments)    Vertigo    Phenytoin Anxiety    Hyperactivity, and Ineffective. Per New Patient Packet.     Family History  Problem Relation Age of Onset   Heart disease Father    Heart failure Father    Hypertension Father    Stroke Father    Stroke Mother    Dementia Mother    Kidney Stones Daughter    Anxiety disorder Daughter    OCD Daughter     Prior to Admission medications   Medication Sig Start Date End Date Taking? Authorizing Provider  anastrozole (ARIMIDEX) 1 MG tablet 1/2 tablet (0.5 mg) by mouth once weekly 05/07/21   [provider]  aspirin 81 MG chewable tablet Chew 1 tablet (81 mg total) by mouth daily. 12/01/21 12/01/22  Val Riles, MD  butalbital-acetaminophen-caffeine (FIORICET) [phone] MG tablet Take 1 tablet by mouth every 6 (six) hours as needed for headache. 03/13/22   [name], NP  cetirizine (ZYRTEC) 10 MG tablet Take 10 mg by mouth daily as needed for allergies.    [provider]  Cholecalciferol 50 MCG (2000 UT) CAPS Take 1 tablet by mouth daily. 04/15/20   [provider]  clopidogrel (PLAVIX) 75 MG tablet Take 1 tablet (75 mg) by mouth once daily 03/24/22   Leonie Man, MD  Cyanocobalamin (VITAMIN B-12) 5000 MCG LOZG Take 1 lozenge by mouth daily.     [provider]  DHEA 25 MG CAPS Take 25 mg by mouth daily.    [provider]  diazepam (VALIUM) 5 MG tablet TAKE 1 TABLET IN THE MORNING AND 2 TABLETS AT BEDTIME 04/03/22   [name], NP  dicyclomine (BENTYL) 20 MG tablet Take 20 mg by mouth as needed. 07/20/20   [provider]  Digestive Aids Mixture (DIGESTION GB PO) Take 1  capsule by mouth daily.    [provider]         escitalopram (LEXAPRO) 20 MG tablet TAKE 1 TABLET BY MOUTH EVERY  DAY 03/26/22   [name], NP  ezetimibe (ZETIA) 10 MG tablet Take 10 mg by mouth daily.    [provider]  HYDROcodone-acetaminophen (NORCO) 10-325 MG tablet Take 1 tablet by mouth 3 (three) times daily as needed. 05/04/22 06/03/22  [name], MD  ipratropium (ATROVENT) 0.06 % nasal spray Place 1 spray into both nostrils 4 (four) times daily as needed for rhinitis. For up to 5-7 days then stop. 04/07/22   Karamalegos, Devonne Doughty, DO         Loperamide HCl (IMODIUM PO) Take by mouth as needed.     [provider]  MAGNESIUM BISGLYCINATE PO Take by mouth daily.    [provider]  melatonin 5 MG TABS Take 5 mg by mouth at bedtime.    [provider]  NONFORMULARY OR COMPOUNDED ITEM Super Bi-Mix Injection  Papaverine HCI '30mg'$ /mL Phentolamine Mesylate '1mg'$ /mL Inject 0.13m - 162mIncrease as needed to achieve erection 05/15/21   SnBilley CoMD  Nutritional Supplements (NUTRITIONAL SUPPLEMENT PO) Take 1 capsule by mouth daily. [name]    [provider]  Nutritional Supplements (NUTRITIONAL SUPPLEMENT PO) Take by mouth daily. InflammaSaver    [provider]  ondansetron (ZOFRAN) 4 MG tablet Take 4 mg by mouth every 6 (six) hours as needed for nausea or vomiting.    [provider]  polyethylene glycol (MIRALAX / GLYCOLAX) 17 g packet Take 17 g by mouth daily as needed.    [provider]  Probiotic Product (PROBIOTIC DAILY PO) Take by mouth daily.    [provider]  prochlorperazine (COMPAZINE) 10 MG tablet Take 1 tablet (10 mg total) by mouth every 8 (eight) hours as needed for nausea or vomiting. TAKE 1 TABLET (10 MG TOTAL) BY MOUTH EVERY 6 (SIX) HOURS AS NEEDED for HEADACHE AND NAUSEA, LIMIT USE TO 3 DAYS PER WEEK 03/13/22   [name], ReCoralie KeensNP  propranolol (INDERAL) 40 MG tablet Take 1 tablet (40 mg total) by mouth 2 (two) times daily. 02/06/22   BaJearld FentonNP  pyridostigmine (MESTINON) 60  MG tablet TAKE 1 TABLET BY MOUTH TWICE A DAY 04/13/22   BaJearld FentonNP  RABEprazole (ACIPHEX) 20 MG tablet TAKE 1 TABLET BY MOUTH TWICE A DAY 03/26/22   BaJearld FentonNP  sucralfate (CARAFATE) 1 g tablet Take 1 g by mouth 4 (four) times daily as needed.     [provider]  testosterone cypionate (DEPOTESTOSTERONE CYPIONATE) 200 MG/ML injection Inject 80 mg into the muscle every 14 (fourteen) days. 11/27/20   [provider]  valACYclovir (VALTREX) 1000 MG tablet Take 2 tabs p.o. and repeat in 12 hours as needed for cold sore 10/29/21   BaJearld FentonNP    Physical Exam: Vitals:   04/19/22 0949 04/19/22 1008 04/19/22 1219  BP: (!) 146/87 (!) 172/78 131/69  Pulse: 72 65 61  Resp: '19 16 18  '$ Temp: 99.7 F (37.6 C)  98.3 F (36.8 C)  TempSrc: Oral  Oral  SpO2: 96% 96% 100%  Weight: 63.5 kg    Height: '5\' 9"'$  (1.753 m)      Constitutional: NAD, calm, comfortable, appears fatigued Eyes: PERRL, lids and conjunctivae normal ENMT: Mucous membranes are moist. Posterior pharynx is clear, no cobblestoning, he does  not have tonsils Neck: normal, supple Respiratory: Relatively clear, no crackles or rales, no wheezing.  He has decreased sounds at the bases.  Cardiovascular: RR, NR, no murmur noted.  He has 2+ pulses in the radial and DP arteries bilaterally.  He has no LE edema.  Abdomen: +BS, NT, ND, soft,  Musculoskeletal: no clubbing / cyanosis. Normal tone and bulk for age.  Skin: He has chronic skin changes related to age, no acute rash or other findings on exposed skin.  Neurologic: Grossly intact, moving easily in bed, speech is easy to follow without slurring.  Psychiatric: Normal judgment and insight. Alert and oriented x 3. Normal mood.   Labs on Admission: I have personally reviewed following labs and imaging studies  CBC: Recent Labs  Lab 04/19/22 0952  WBC 9.7  NEUTROABS 7.3  HGB 14.5  HCT 44.3  MCV 87.9  PLT 573    Basic Metabolic Panel: Recent  Labs  Lab 04/19/22 0952  NA 141  K 3.7  CL 108  CO2 24  GLUCOSE 103*  BUN 15  CREATININE 0.73  CALCIUM 9.3    GFR: Estimated Creatinine Clearance: 73.9 mL/min (by C-G formula based on SCr of 0.73 mg/dL).  Liver Function Tests: Recent Labs  Lab 04/19/22 0952  AST 18  ALT 16  ALKPHOS 109  BILITOT 0.6  PROT 6.7  ALBUMIN 3.5    Urine analysis:    Component Value Date/Time   COLORURINE STRAW (A) 04/19/2022 1206   APPEARANCEUR CLEAR (A) 04/19/2022 1206   APPEARANCEUR Clear 10/14/2020 1417   LABSPEC >1.046 (H) 04/19/2022 1206   PHURINE 8.0 04/19/2022 1206   GLUCOSEU NEGATIVE 04/19/2022 1206   HGBUR NEGATIVE 04/19/2022 1206   BILIRUBINUR NEGATIVE 04/19/2022 1206   BILIRUBINUR negative 03/13/2022 0952   BILIRUBINUR Negative 10/14/2020 1417   KETONESUR NEGATIVE 04/19/2022 1206   PROTEINUR NEGATIVE 04/19/2022 1206   UROBILINOGEN 0.2 03/13/2022 0952   NITRITE NEGATIVE 04/19/2022 1206   LEUKOCYTESUR NEGATIVE 04/19/2022 1206    Radiological Exams on Admission: CT Angio Chest PE W and/or Wo Contrast  Result Date: 04/19/2022 CLINICAL DATA:  Pt repots was seen a week ago dx'd with pneumonia and given medications. Pt reports has almost ocmpletred abx and his sx's have started to get worse. Pt reports continued to cough, still with fever, sore throat, headache and sob. EXAM: CT ANGIOGRAPHY CHEST WITH CONTRAST TECHNIQUE: Multidetector CT imaging of the chest was performed using the standard protocol during bolus administration of intravenous contrast. Multiplanar CT image reconstructions and MIPs were obtained to evaluate the vascular anatomy. RADIATION DOSE REDUCTION: This exam was performed according to the departmental dose-optimization program which includes automated exposure control, adjustment of the mA and/or kV according to patient size and/or use of iterative reconstruction technique. CONTRAST:  11m OMNIPAQUE IOHEXOL 350 MG/ML SOLN COMPARISON:  Chest radiographs, 04/15/2022  and older studies. FINDINGS: Cardiovascular: Pulmonary arteries are well opacified. There is no evidence of a pulmonary embolism. Heart is normal in size and configuration. Three-vessel coronary artery calcifications. No pericardial effusion. Ascending thoracic aorta measures 3.9 cm, just below the threshold for aneurysm. Mediastinum/Nodes: Normal thyroid. No neck base, mediastinal or hilar masses. There are enlarged right hilar lymph nodes. An anterior superior hilar node measures 1.6 cm. A right infrahilar node measures 1.3 cm in short axis. Trachea and esophagus are unremarkable. Lungs/Pleura: Masslike opacity in the posterior right lower lobe, centered on image 104, series 5, measuring 4.7 x 3.3 x 4.3 cm. There is hazy ground-glass opacity that  surrounds this masslike opacity. Patchy peribronchovascular opacity extends from the anterior superior right hilum into the right upper lobe. There are associated ill-defined nodules. Small focal area of opacity noted in the anterior left upper lobe image 83, series 5. Mild peribronchovascular opacity noted in the posterior left lower lobe with small adjacent nodules. Nodular type opacities noted in the posterior right middle lobe, largest 9 mm, image 104, series 5. Mild peribronchovascular opacities and nodular opacities in the left upper lobe lingula. No evidence of pulmonary edema. No pleural effusion or pneumothorax. Upper Abdomen: No acute abnormality. Musculoskeletal: No fracture or acute finding. No bone lesion. Posterior midthoracic spinal canal stimulator. Review of the MIP images confirms the above findings. IMPRESSION: 1. No evidence of a pulmonary embolism. 2. Bilateral lung opacities as detailed above, overall with an inflammatory/infectious appearance. Mass-like opacity in the posterior right lower lobe, likely due to infection. Multiple other small nodular opacities noted bilaterally, also likely infection or inflammation. Short-term follow-up is  recommended to document improvement, with repeat chest CT in 4-6 weeks after treatment. 3. Right hilar lymphadenopathy, presumed reactive. Electronically Signed   By: Lajean Manes M.D.   On: 04/19/2022 11:07   CT HEAD WO CONTRAST (5MM)  Result Date: 04/19/2022 CLINICAL DATA:  Headache. EXAM: CT HEAD WITHOUT CONTRAST TECHNIQUE: Contiguous axial images were obtained from the base of the skull through the vertex without intravenous contrast. RADIATION DOSE REDUCTION: This exam was performed according to the departmental dose-optimization program which includes automated exposure control, adjustment of the mA and/or kV according to patient size and/or use of iterative reconstruction technique. COMPARISON:  07/30/2020 FINDINGS: Brain: There is no evidence for acute hemorrhage, hydrocephalus, mass lesion, or abnormal extra-axial fluid collection. No definite CT evidence for acute infarction. Diffuse loss of parenchymal volume is consistent with atrophy. Patchy low attenuation in the deep hemispheric and periventricular white matter is nonspecific, but likely reflects chronic microvascular ischemic demyelination. 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 Vascular: No hyperdense vessel or unexpected calcification. Skull: No evidence for fracture. No worrisome lytic or sclerotic lesion. Sinuses/Orbits: Small polypoid focus of mucosal disease identified left maxillary sinus, stable. Otherwise, the visualized paranasal sinuses and mastoid air cells are clear. Visualized portions of the globes and intraorbital fat are unremarkable. Other: None. IMPRESSION: 1. No acute intracranial abnormality. 2. 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 3. Atrophy with chronic small vessel ischemic disease. Electronically Signed   By: Misty Stanley M.D.   On:  04/19/2022 11:00    EKG: Independently reviewed. EKG with normal sinus rhythm.  No TWI or ST depressions noted.  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   Assessment/Plan Community acquired bacterial pneumonia - He has been having symptoms since June, completed 1 course of antibiotics and half way through a course of levaquin - He was started on ceftriaxone, azithromycin and vancomycin in the ED - Blood cultures and sputum cultures requested - WBC at normal levels, PCT low - Will check urine antigens for pneumococcus and legionella  - Check MRSA PCR - COVID negative - Lungs relatively clear on exam, no SOB and no hypoxia - Guaifenisin for cough, robitussin - Personally, unconvinced this is inadequately treated pneumonia.  Consider stopping antibiotics if all work up returns negative - Since he improved on steroids in the past, could also try a course of  steroids and/or consultation with pulmonary for possible rheumatological cause of interstitial pneumonia.   Anxiety and depression - Continue chronic valium (long term medication) and lexapro  CAD s/p stents in March - Continue aspirin, plavix - Telemetry - EKG for any chest pain    Hyperlipidemia LDL goal <70 - Continue home zetia    Essential hypertension - Continue home propranolol    GERD (gastroesophageal reflux disease) - Pharmacy equivalent of protonix started    Hypotestosteronism - He sees an integrative provider and is prescribed testosterone and arimidex every 2 weeks.      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 Observation status is judged to be reasonable and necessary in order to provide the required intensity of service to ensure the patient's safety. The patient's presenting symptoms, physical exam findings, and initial radiographic and laboratory data in the context of their medical condition is felt to place them at decreased risk for further clinical deterioration. Furthermore, it is anticipated that the patient will be medically stable for discharge from the hospital within 2 midnights of admission.     Gilles Chiquito MD [facility]  How to contact the [place] Attending or Consulting provider [place] or covering provider during after hours [place], for this patient?   Check the care team in [facility] and look for a) attending/consulting [facility] provider listed and b) the [facility] team listed Log into [email or web address] and use [place]'s universal password to access. If you do not have the password, please contact the hospital operator. Locate the [facility] provider you are looking for under [facility] and page to a number that you can be directly reached. If you still have difficulty reaching the provider, please page the [facility] (Director on Call) for the Hospitalists listed on amion for assistance.  04/19/2022, 1:16 PM

## 2022-04-19 NOTE — Consult Note (Signed)
PHARMACY -  BRIEF ANTIBIOTIC NOTE   Pharmacy has received consult(s) for vancomycin from an ED provider. Patient is also ordered ceftriaxone and azithromycin. The patient's profile has been reviewed for ht/wt/allergies/indication/available labs.    One time order(s) placed for  --Vancomycin 1 g IV  Further antibiotics/pharmacy consults should be ordered by admitting physician if indicated.                       Thank you, Benita Gutter 04/19/2022  12:57 PM

## 2022-04-19 NOTE — ED Notes (Signed)
Pt ambulated approx 200 ft pt's oxygen dropped to 90% on room air, pt denied feeling short of breath or having difficulties breathing.

## 2022-04-19 NOTE — ED Triage Notes (Signed)
Pt repots was seen a week ago dx'd with pneumonia and given medications. Pt reports has almost ocmpletred abx and his sx's have started to get worse. Pt reports continued to cough, still with fever, sore throat, headache and sob.

## 2022-04-20 DIAGNOSIS — G43719 Chronic migraine without aura, intractable, without status migrainosus: Secondary | ICD-10-CM | POA: Diagnosis not present

## 2022-04-20 DIAGNOSIS — Z9682 Presence of neurostimulator: Secondary | ICD-10-CM | POA: Diagnosis not present

## 2022-04-20 DIAGNOSIS — G629 Polyneuropathy, unspecified: Secondary | ICD-10-CM | POA: Diagnosis present

## 2022-04-20 DIAGNOSIS — R251 Tremor, unspecified: Secondary | ICD-10-CM

## 2022-04-20 DIAGNOSIS — Z8249 Family history of ischemic heart disease and other diseases of the circulatory system: Secondary | ICD-10-CM | POA: Diagnosis not present

## 2022-04-20 DIAGNOSIS — E291 Testicular hypofunction: Secondary | ICD-10-CM | POA: Diagnosis present

## 2022-04-20 DIAGNOSIS — K580 Irritable bowel syndrome with diarrhea: Secondary | ICD-10-CM

## 2022-04-20 DIAGNOSIS — K219 Gastro-esophageal reflux disease without esophagitis: Secondary | ICD-10-CM | POA: Diagnosis present

## 2022-04-20 DIAGNOSIS — J189 Pneumonia, unspecified organism: Secondary | ICD-10-CM | POA: Diagnosis not present

## 2022-04-20 DIAGNOSIS — E785 Hyperlipidemia, unspecified: Secondary | ICD-10-CM

## 2022-04-20 DIAGNOSIS — J159 Unspecified bacterial pneumonia: Secondary | ICD-10-CM | POA: Diagnosis present

## 2022-04-20 DIAGNOSIS — Z818 Family history of other mental and behavioral disorders: Secondary | ICD-10-CM | POA: Diagnosis not present

## 2022-04-20 DIAGNOSIS — Z823 Family history of stroke: Secondary | ICD-10-CM | POA: Diagnosis not present

## 2022-04-20 DIAGNOSIS — Z20822 Contact with and (suspected) exposure to covid-19: Secondary | ICD-10-CM | POA: Diagnosis present

## 2022-04-20 DIAGNOSIS — Z85828 Personal history of other malignant neoplasm of skin: Secondary | ICD-10-CM | POA: Diagnosis not present

## 2022-04-20 DIAGNOSIS — R918 Other nonspecific abnormal finding of lung field: Secondary | ICD-10-CM

## 2022-04-20 DIAGNOSIS — G2581 Restless legs syndrome: Secondary | ICD-10-CM | POA: Diagnosis present

## 2022-04-20 DIAGNOSIS — I25110 Atherosclerotic heart disease of native coronary artery with unstable angina pectoris: Secondary | ICD-10-CM

## 2022-04-20 DIAGNOSIS — F32A Depression, unspecified: Secondary | ICD-10-CM | POA: Diagnosis present

## 2022-04-20 DIAGNOSIS — F419 Anxiety disorder, unspecified: Secondary | ICD-10-CM | POA: Diagnosis present

## 2022-04-20 DIAGNOSIS — Z9049 Acquired absence of other specified parts of digestive tract: Secondary | ICD-10-CM | POA: Diagnosis not present

## 2022-04-20 DIAGNOSIS — G894 Chronic pain syndrome: Secondary | ICD-10-CM | POA: Diagnosis present

## 2022-04-20 DIAGNOSIS — G8929 Other chronic pain: Secondary | ICD-10-CM | POA: Diagnosis present

## 2022-04-20 DIAGNOSIS — I10 Essential (primary) hypertension: Secondary | ICD-10-CM | POA: Diagnosis present

## 2022-04-20 DIAGNOSIS — I252 Old myocardial infarction: Secondary | ICD-10-CM | POA: Diagnosis not present

## 2022-04-20 DIAGNOSIS — Z87442 Personal history of urinary calculi: Secondary | ICD-10-CM | POA: Diagnosis not present

## 2022-04-20 DIAGNOSIS — Z955 Presence of coronary angioplasty implant and graft: Secondary | ICD-10-CM | POA: Diagnosis not present

## 2022-04-20 DIAGNOSIS — G43709 Chronic migraine without aura, not intractable, without status migrainosus: Secondary | ICD-10-CM | POA: Diagnosis present

## 2022-04-20 LAB — CBC
HCT: 39.8 % (ref 39.0–52.0)
Hemoglobin: 13.2 g/dL (ref 13.0–17.0)
MCH: 28.4 pg (ref 26.0–34.0)
MCHC: 33.2 g/dL (ref 30.0–36.0)
MCV: 85.8 fL (ref 80.0–100.0)
Platelets: 256 10*3/uL (ref 150–400)
RBC: 4.64 MIL/uL (ref 4.22–5.81)
RDW: 12.5 % (ref 11.5–15.5)
WBC: 7.8 10*3/uL (ref 4.0–10.5)
nRBC: 0 % (ref 0.0–0.2)

## 2022-04-20 LAB — MRSA NEXT GEN BY PCR, NASAL: MRSA by PCR Next Gen: NOT DETECTED

## 2022-04-20 LAB — EXPECTORATED SPUTUM ASSESSMENT W GRAM STAIN, RFLX TO RESP C

## 2022-04-20 LAB — COMPREHENSIVE METABOLIC PANEL
ALT: 14 U/L (ref 0–44)
AST: 14 U/L — ABNORMAL LOW (ref 15–41)
Albumin: 3 g/dL — ABNORMAL LOW (ref 3.5–5.0)
Alkaline Phosphatase: 94 U/L (ref 38–126)
Anion gap: 9 (ref 5–15)
BUN: 14 mg/dL (ref 8–23)
CO2: 26 mmol/L (ref 22–32)
Calcium: 9 mg/dL (ref 8.9–10.3)
Chloride: 108 mmol/L (ref 98–111)
Creatinine, Ser: 0.68 mg/dL (ref 0.61–1.24)
GFR, Estimated: >60 mL/min (ref >60)
Glucose, Bld: 95 mg/dL (ref 70–99)
Potassium: 3.5 mmol/L (ref 3.5–5.1)
Sodium: 143 mmol/L (ref 135–145)
Total Bilirubin: 0.7 mg/dL (ref 0.3–1.2)
Total Protein: 5.8 g/dL — ABNORMAL LOW (ref 6.5–8.1)

## 2022-04-20 LAB — GLUCOSE, CAPILLARY: Glucose-Capillary: 87 mg/dL (ref 70–99)

## 2022-04-20 LAB — PROCALCITONIN: Procalcitonin: <0.1 ng/mL

## 2022-04-20 NOTE — Assessment & Plan Note (Signed)
Continue Zetia. °

## 2022-04-20 NOTE — Assessment & Plan Note (Addendum)
Will need treatment for pneumonia and then potentially a outpatient bronchoscopy if this does not clear up with treatment of pneumonia.  Case discussed with Dr. Raul Del pulmonology to see patient.  Smaller nodular opacities noted bilaterally which could be infection versus inflammation.  Will need short-term follow-up with repeat CT scan of the chest in 4 to 6 weeks.

## 2022-04-20 NOTE — Progress Notes (Signed)
  Progress Note   Patient: Grant OROURKE IWO:[id number] DOB: [birth year] DOA: 04/19/2022     0 DOS: the patient was seen and examined on 04/20/2022     Assessment and Plan: * Multifocal pneumonia Patient on Rocephin and Zithromax.  Send MRSA PCR.  Received 1 dose of vancomycin in the ED.  Right lower lobe lung mass Will need treatment for pneumonia and then potentially a outpatient bronchoscopy if this does not clear up with treatment of pneumonia.  Case discussed with Dr. Raul Del pulmonology to see patient.  Smaller nodular opacities noted bilaterally which could be infection versus inflammation.  Will need short-term follow-up with repeat CT scan of the chest in 4 to 6 weeks.  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(HCC) On aspirin and Plavix and Zetia  Chronic migraine without aura as needed Fioricet  IBS (irritable bowel syndrome) On as needed Bentyl  GERD (gastroesophageal reflux disease) On Protonix  Anxiety and depression Continue Valium and Lexapro  Hyperlipidemia LDL goal <70 Continue Zetia  Tremor On propranolol        Subjective: Patient not feeling well for months with congestion, dry cough, headaches, fevers.  No problems swallowing.  Physical Exam: Vitals:   04/19/22 2358 04/20/22 0418 04/20/22 0424 04/20/22 0738  BP: (!) 157/68 (!) 155/82  139/77  Pulse: 62 69  60  Resp: '18 18  16  '$ Temp:  98 F (36.7 C)  97.8 F (36.6 C)  TempSrc:      SpO2: 98% 96%  96%  Weight:   61.1 kg   Height:       Physical Exam HENT:     Head: Normocephalic.     Mouth/Throat:     Pharynx: No oropharyngeal exudate.  Eyes:     General: Lids are normal.     Conjunctiva/sclera: Conjunctivae normal.  Cardiovascular:     Rate and Rhythm: Normal rate and regular rhythm.     Heart sounds: Normal heart sounds, S1 normal and S2 normal.  Pulmonary:     Breath sounds: No decreased breath sounds,  wheezing, rhonchi or rales.  Abdominal:     Palpations: Abdomen is soft.     Tenderness: There is no abdominal tenderness.  Musculoskeletal:     Right lower leg: No swelling.     Left lower leg: No swelling.  Skin:    General: Skin is warm.     Findings: No rash.  Neurological:     Mental Status: He is alert and oriented to person, place, and time.     Data Reviewed: CT showing masslike area in the right lower lobe pneumonia.  Also seeing some other nodular densities. CBC normal range, procalcitonin negative, chemistry normal range.  Family Communication: Left message for patient's wife  Disposition: Status is: Observation With masslike area on his lungs will get pulmonary evaluation.  Likely home tomorrow with outpatient follow-up and likely will need a bronchoscopy as outpatient.  Planned Discharge Destination: Home    Time spent: 28 minutes  Author: Loletha Grayer, MD 04/20/2022 12:42 PM  For on call review [email or web address].

## 2022-04-20 NOTE — Consult Note (Signed)
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   Of note his hx is preceded by recurrent sinus complaints. S/p doxy and levaquin.   PAST MEDICAL HISTORY   Past Medical History:  Diagnosis Date   Anxiety    Per New Patient Packet   Chronic back pain    Per New Patient Packet   Chronic heart disease    Per New Patient Packet   Depression    GERD (gastroesophageal reflux disease)    Headache    History of CT scan of brain 09/14/2018   Per New Patient Packet   History of depression    Per New Patient Packet   History of gastritis    Per New Patient Packet   History of headache    Per New Patient Packet   History of kidney stones    Per New Patient Packet   History of neuropathy    Per New Patient Packet   Hypertension    Per New Patient Packet   Insomnia    Kidney stone    Lumbar radicular pain    Per New Patient Packet   Lyme disease    Per New Patient Packet   Myocardial infarction [facility])    Overactive bladder    PONV (postoperative nausea and vomiting)    Skin cancer, basal cell    Sleep trouble    Per New Patient Packet   Spinal cord stimulator status    01/08/21 - not currently using.   Squamous cell skin cancer    Substance abuse ([place])      SURGICAL HISTORY   Past Surgical History:  Procedure Laterality Date   BACK SURGERY     CARDIAC  CATHETERIZATION     CATARACT EXTRACTION W/PHACO Left 01/14/2021   Procedure: CATARACT EXTRACTION PHACO AND INTRAOCULAR LENS PLACEMENT ([place]) LEFT VIVITY TORIC LENS 8.75 00:56.7;  Surgeon: Birder Robson, MD;  Location: [facility];  Service: Ophthalmology;  Laterality: Left;   CATARACT EXTRACTION W/PHACO Right 01/28/2021   Procedure: CATARACT EXTRACTION PHACO AND INTRAOCULAR LENS PLACEMENT ([place]) RIGHT VIVITY TORIC LENS;  Surgeon: Birder Robson, MD;  Location: [facility];  Service: Ophthalmology;  Laterality: Right;  6.54 00:46.4   CHOLECYSTECTOMY  2017   COLONOSCOPY  09/15/2015   Per New Patient Packet   COLONOSCOPY WITH PROPOFOL N/A 10/03/2020   Procedure: COLONOSCOPY WITH PROPOFOL;  Surgeon: [name], MD;  Location: [facility] ENDOSCOPY;  Service: Gastroenterology;  Laterality: N/A;   CORONARY/GRAFT ACUTE MI REVASCULARIZATION N/A 11/27/2021   Procedure: Coronary/Graft Acute MI Revascularization;  Surgeon: Leonie Man, MD;  Location: [facility];  Service: Cardiovascular;  Laterality: N/A;   ESOPHAGOGASTRODUODENOSCOPY (EGD) WITH PROPOFOL N/A 10/03/2020   Procedure: ESOPHAGOGASTRODUODENOSCOPY (EGD) WITH PROPOFOL;  Surgeon: [name], MD;  Location[facility] ENDOSCOPY;  Service: Gastroenterology;  Laterality: N/A;   FOOT SURGERY Bilateral 03/18/2020   FOOT SURGERY  08/032021   GALLBLADDER SURGERY  09/15/2015   Gallbladder Removal. Procedure done by Dr.Beverly. Per New Patient Packet   KIDNEY STONE SURGERY  09/14/1980   Too many to count. Per New Patient Packet 09/14/1980-09/15/1995   KIDNEY STONE SURGERY  09/15/1995   Too many to count. Per New Patient Packet   LEFT HEART CATH AND CORONARY ANGIOGRAPHY N/A 11/27/2021   Procedure: LEFT HEART CATH AND CORONARY ANGIOGRAPHY;  Surgeon: Leonie Man, MD;  Location: [place] [facility];  Service: Cardiovascular;  Laterality: N/A;   LITHOTRIPSY  09/14/2014   Per New Patient Packet   LITHOTRIPSY  09/14/1996   No Stints  Used. Per New Patient Packet   LITHOTRIPSY  09/14/1997   No Stints used. Per New Patient Packet   PAIN PUMP IMPLANTATION     PAIN PUMP REMOVAL     SHOULDER SURGERY Right 1984   SIGMOIDOSCOPY  09/14/2017   Per New Patient Packet   SPINAL CORD STIMULATOR IMPLANT     TONSILLECTOMY     UPPER GI ENDOSCOPY       FAMILY HISTORY   Family History  Problem Relation Age of Onset   Heart disease Father    Heart failure Father    Hypertension Father    Stroke Father    Stroke Mother    Dementia Mother    Kidney Stones Daughter    Anxiety disorder Daughter    OCD Daughter      SOCIAL HISTORY   Social History   Tobacco Use   Smoking status: Never   Smokeless tobacco: Never  Vaping Use   Vaping Use: Never used  Substance Use Topics   Alcohol use: Yes    Comment: 1 Drink a Month, socially   Drug use: Not Currently     MEDICATIONS    Home Medication:    Current Medication:  Current Facility-Administered Medications:    acidophilus (RISAQUAD) capsule 1 capsule, 1 capsule, Oral, Daily, Gilles Chiquito B, MD, 1 capsule at 04/20/22 0957   albuterol (PROVENTIL) (2.5 MG/3ML) 0.083% nebulizer solution 2.5 mg, 2.5 mg, Nebulization, Q2H PRN, Gilles Chiquito B, MD   aspirin chewable tablet 81 mg, 81 mg, Oral, Daily, Gilles Chiquito B, MD, 81 mg at 04/20/22 0957   azithromycin (ZITHROMAX) 500 mg in sodium chloride 0.9 % 250 mL IVPB, 500 mg, Intravenous, Q24H, Gilles Chiquito B, MD, Last Rate: 250 mL/hr at 04/20/22 1350, 500 mg at 04/20/22 1350   butalbital-acetaminophen-caffeine (FIORICET) 50-325-40 MG per tablet 1 tablet, 1 tablet, Oral, Q6H PRN, Gilles Chiquito B, MD, 1 tablet at 04/20/22 0458   cefTRIAXone (ROCEPHIN) 2 g in sodium chloride 0.9 % 100 mL IVPB, 2 g, Intravenous, Q24H, Gilles Chiquito B, MD, Last Rate: 200 mL/hr at 04/20/22 1232, 2 g at 04/20/22 1232   clopidogrel (PLAVIX) tablet 75 mg, 75 mg, Oral, Daily, Gilles Chiquito B, MD, 75 mg at 04/20/22 0957   diazepam (VALIUM) tablet 10  mg, 10 mg, Oral, QHS, Gilles Chiquito B, MD, 10 mg at 04/19/22 2129   diazepam (VALIUM) tablet 5 mg, 5 mg, Oral, Daily, Gilles Chiquito B, MD, 5 mg at 04/20/22 0957   dicyclomine (BENTYL) tablet 20 mg, 20 mg, Oral, PRN, Gilles Chiquito B, MD   enoxaparin (LOVENOX) injection 40 mg, 40 mg, Subcutaneous, Q24H, Gilles Chiquito B, MD, 40 mg at 04/19/22 2128   escitalopram (  LEXAPRO) tablet 20 mg, 20 mg, Oral, QPM, Gilles Chiquito B, MD, 20 mg at 04/19/22 1736   ezetimibe (ZETIA) tablet 10 mg, 10 mg, Oral, Daily, Gilles Chiquito B, MD, 10 mg at 04/20/22 0957   guaiFENesin-dextromethorphan (ROBITUSSIN DM) 100-10 MG/5ML syrup 5 mL, 5 mL, Oral, Q4H PRN, Sid Falcon, MD   HYDROcodone-acetaminophen (NORCO/VICODIN) 5-325 MG per tablet 1 tablet, 1 tablet, Oral, Q6H PRN, Sid Falcon, MD   loperamide (IMODIUM) capsule 2 mg, 2 mg, Oral, PRN, Athena Masse, MD, 2 mg at 04/20/22 1400   ondansetron (ZOFRAN) tablet 4 mg, 4 mg, Oral, Q6H PRN, Sid Falcon, MD   pantoprazole (PROTONIX) EC tablet 40 mg, 40 mg, Oral, Daily, Gilles Chiquito B, MD, 40 mg at 04/20/22 0957   polyethylene glycol (MIRALAX / GLYCOLAX) packet 17 g, 17 g, Oral, Daily PRN, Sid Falcon, MD   prochlorperazine (COMPAZINE) tablet 10 mg, 10 mg, Oral, Q8H PRN, Gilles Chiquito B, MD, 10 mg at 04/20/22 0519   propranolol (INDERAL) tablet 40 mg, 40 mg, Oral, BID, Gilles Chiquito B, MD, 40 mg at 04/20/22 0957   pyridostigmine (MESTINON) tablet 60 mg, 60 mg, Oral, BID, Gilles Chiquito B, MD, 60 mg at 04/20/22 0957   ramelteon (ROZEREM) tablet 8 mg, 8 mg, Oral, QHS, Gilles Chiquito B, MD, 8 mg at 04/19/22 2132   sodium chloride flush (NS) 0.9 % injection 3 mL, 3 mL, Intravenous, Q12H, Gilles Chiquito B, MD, 3 mL at 04/19/22 2133    ALLERGIES   Ace inhibitors, Fluoxetine, Metoclopramide, Nalbuphine, Other, Amoxicillin-pot clavulanate, Doxazosin, Duloxetine, Penicillins, Tamsulosin, Trazodone and nefazodone, Amlodipine, Cinoxacin, Ciprofloxacin, Fludrocortisone,  Nebivolol, Olanzapine, Olmesartan, Pregabalin, Prostaglandins, Thyroid hormones, Zolpidem, Duloxetine hcl, Fluoxetine hcl, Nucynta [tapentadol], and Phenytoin     REVIEW OF SYSTEMS    Review of Systems:  Gen:  Denies  fever, sweats, chills weigh loss  HEENT: Denies blurred vision, double vision, ear pain, eye pain, hearing loss, nose bleeds, sore throat Cardiac:  No dizziness, chest pain or heaviness, chest tightness,edema Resp:   Denies cough or sputum porduction, shortness of breath,wheezing, hemoptysis,  Gi: Denies swallowing difficulty, stomach pain, nausea or vomiting, diarrhea, constipation, bowel incontinence Gu:  Denies bladder incontinence, burning urine Ext:   Denies Joint pain, stiffness or swelling Skin: Denies  skin rash, easy bruising or bleeding or hives Endoc:  Denies polyuria, polydipsia , polyphagia or weight change Psych:   Denies depression, insomnia or hallucinations   Other:  All other systems negative   VS: BP 135/72 (BP Location: Right Arm)   Pulse 64   Temp 98.2 F (36.8 C)   Resp 16   Ht '5\' 9"'$  (1.753 m)   Wt 61.1 kg   SpO2 97%   BMI 19.89 kg/m      PHYSICAL EXAM    GENERAL:NAD, no fevers, chills, no weakness no fatigue HEAD: Normocephalic, atraumatic.  EYES: Pupils equal, round, reactive to light. Extraocular muscles intact. No scleral icterus.  MOUTH: Moist mucosal membrane. Dentition intact. No abscess noted.  EAR, NOSE, THROAT: Clear without exudates. No external lesions.  NECK: Supple. No thyromegaly. No nodules. No JVD.  PULMONARY: Diffuse coarse rhonchi right sided +wheezes CARDIOVASCULAR: S1 and S2. Regular rate and rhythm. No murmurs, rubs, or gallops. No edema. Pedal pulses 2+ bilaterally.  GASTROINTESTINAL: Soft, nontender, nondistended. No masses. Positive bowel sounds. No hepatosplenomegaly.  MUSCULOSKELETAL: No swelling, clubbing, or edema. Range of motion full in all extremities.  NEUROLOGIC: Cranial nerves II through XII are  intact. No  gross focal neurological deficits. Sensation intact. Reflexes intact.  SKIN: No ulceration, lesions, rashes, or cyanosis. Skin warm and dry. Turgor intact.  PSYCHIATRIC: Mood, affect within normal limits. The patient is awake, alert and oriented x 3. Insight, judgment intact.       IMAGING    CT Angio Chest PE W and/or Wo Contrast  Result Date: 04/19/2022 CLINICAL DATA:  Pt repots was seen a week ago dx'd with pneumonia and given medications. Pt reports has almost ocmpletred abx and his sx's have started to get worse. Pt reports continued to cough, still with fever, sore throat, headache and sob. EXAM: CT ANGIOGRAPHY CHEST WITH CONTRAST TECHNIQUE: Multidetector CT imaging of the chest was performed using the standard protocol during bolus administration of intravenous contrast. Multiplanar CT image reconstructions and MIPs were obtained to evaluate the vascular anatomy. RADIATION DOSE REDUCTION: This exam was performed according to the departmental dose-optimization program which includes automated exposure control, adjustment of the mA and/or kV according to patient size and/or use of iterative reconstruction technique. CONTRAST:  73m OMNIPAQUE IOHEXOL 350 MG/ML SOLN COMPARISON:  Chest radiographs, 04/15/2022 and older studies. FINDINGS: Cardiovascular: Pulmonary arteries are well opacified. There is no evidence of a pulmonary embolism. Heart is normal in size and configuration. Three-vessel coronary artery calcifications. No pericardial effusion. Ascending thoracic aorta measures 3.9 cm, just below the threshold for aneurysm. Mediastinum/Nodes: Normal thyroid. No neck base, mediastinal or hilar masses. There are enlarged right hilar lymph nodes. An anterior superior hilar node measures 1.6 cm. A right infrahilar node measures 1.3 cm in short axis. Trachea and esophagus are unremarkable. Lungs/Pleura: Masslike opacity in the posterior right lower lobe, centered on image 104, series 5,  measuring 4.7 x 3.3 x 4.3 cm. There is hazy ground-glass opacity that surrounds this masslike opacity. Patchy peribronchovascular opacity extends from the anterior superior right hilum into the right upper lobe. There are associated ill-defined nodules. Small focal area of opacity noted in the anterior left upper lobe image 83, series 5. Mild peribronchovascular opacity noted in the posterior left lower lobe with small adjacent nodules. Nodular type opacities noted in the posterior right middle lobe, largest 9 mm, image 104, series 5. Mild peribronchovascular opacities and nodular opacities in the left upper lobe lingula. No evidence of pulmonary edema. No pleural effusion or pneumothorax. Upper Abdomen: No acute abnormality. Musculoskeletal: No fracture or acute finding. No bone lesion. Posterior midthoracic spinal canal stimulator. Review of the MIP images confirms the above findings. IMPRESSION: 1. No evidence of a pulmonary embolism. 2. Bilateral lung opacities as detailed above, overall with an inflammatory/infectious appearance. Mass-like opacity in the posterior right lower lobe, likely due to infection. Multiple other small nodular opacities noted bilaterally, also likely infection or inflammation. Short-term follow-up is recommended to document improvement, with repeat chest CT in 4-6 weeks after treatment. 3. Right hilar lymphadenopathy, presumed reactive. Electronically Signed   By: DLajean ManesM.D.   On: 04/19/2022 11:07   CT HEAD WO CONTRAST (5MM)  Result Date: 04/19/2022 CLINICAL DATA:  Headache. EXAM: CT HEAD WITHOUT CONTRAST TECHNIQUE: Contiguous axial images were obtained from the base of the skull through the vertex without intravenous contrast. RADIATION DOSE REDUCTION: This exam was performed according to the departmental dose-optimization program which includes automated exposure control, adjustment of the mA and/or kV according to patient size and/or use of iterative reconstruction  technique. COMPARISON:  07/30/2020 FINDINGS: Brain: There is no evidence for acute hemorrhage, hydrocephalus, mass lesion, or abnormal extra-axial fluid collection. No definite  CT evidence for acute infarction. Diffuse loss of parenchymal volume is consistent with atrophy. Patchy low attenuation in the deep hemispheric and periventricular white matter is nonspecific, but likely reflects chronic microvascular ischemic demyelination. 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 Vascular: No hyperdense vessel or unexpected calcification. Skull: No evidence for fracture. No worrisome lytic or sclerotic lesion. Sinuses/Orbits: Small polypoid focus of mucosal disease identified left maxillary sinus, stable. Otherwise, the visualized paranasal sinuses and mastoid air cells are clear. Visualized portions of the globes and intraorbital fat are unremarkable. Other: None. IMPRESSION: 1. No acute intracranial abnormality. 2. 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 3. Atrophy with chronic small vessel ischemic disease. Electronically Signed   By: Misty Stanley M.D.   On: 04/19/2022 11:00   DG Chest 2 View  Result Date: 04/15/2022 CLINICAL DATA:  Cough x6 months with mild shortness of breath. EXAM: CHEST - 2 VIEW COMPARISON:  November 27, 2021 FINDINGS: The heart size and mediastinal contours are within normal limits. Coronary artery stents are in place. The lungs are hyperinflated. Mild atelectasis is seen within the bilateral lung bases. Mild atelectasis and/or infiltrate is seen within the mid right lung. This is increased in severity when compared to the prior study. There is no evidence of a pleural effusion or pneumothorax. A radiopaque surgical screw is seen within the region of the right glenoid. IMPRESSION: 1. Mild bibasilar atelectasis with mild mid  right lung atelectasis and/or infiltrate, as described above. Further evaluation with chest CT is recommended. Electronically Signed   By: Virgina Norfolk M.D.   On: 04/15/2022 17:10      ASSESSMENT/PLAN    This is 76 yr old male with med and hilar noes and a large right lower lung mass like opacity. Pneumonia with reactive nodes, post obstructive pn vs aspiration. ? Underlying malignancy,  -would treat for pneumonia ( rocephin/zithro)  -repeat chest ct in 4 weeks -bronch per response -if possible sputum c/s,  -incentive spirometry -quatiferon gold -  Thank you for allowing me to participate in the care of this patient.   Patient/Family are satisfied with care plan and all questions have been answered.  This document was prepared using Dragon voice recognition software and may include unintentional dictation errors.     Wallene Huh, M.D.  Division of [facility]

## 2022-04-20 NOTE — Assessment & Plan Note (Signed)
Patient on Rocephin and Zithromax.  Send MRSA PCR.  Received 1 dose of vancomycin in the ED.

## 2022-04-20 NOTE — Evaluation (Signed)
Physical Therapy Evaluation Patient Details Name: Grant Young MRN: [id number] DOB: August 26, 1946 Today's Date: 04/20/2022  History of Present Illness  Grant Young is a 76 y.o. 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    Clinical Impression  Pt received seated in recliner upon arrival to room and pt agreeable to therapy.  Pt's wife arrived to room while pt working with therapist.  Pt notes that he normally enjoys water aerobics at [facility] and would like to get back to doing that if he could feel better from the "pneumonia or whatever it is".  Pt ambulates well and is able to move without any assistance from therapist.  Pt transitioned back to bed and after having a conversation with therapist, pt noted that he does not feel like he needs any therapy at this time.  Patient is at baseline, all education completed, and time is given to address all questions/concerns. No additional skilled PT services needed at this time, PT signing off. PT recommends daily ambulation ad lib or with nursing staff as needed to prevent deconditioning.         Recommendations for follow up therapy are one component of a multi-disciplinary discharge planning process, led by the attending physician.  Recommendations may be updated based on patient status, additional functional criteria and insurance authorization.  Follow Up Recommendations No PT follow up      Assistance Recommended at Discharge None  Patient can return home with the following       Equipment Recommendations None recommended by PT  Recommendations for Other Services       Functional Status Assessment Patient has not had a recent decline in their functional status     Precautions / Restrictions Precautions Precautions: None Restrictions Weight Bearing  Restrictions: No      Mobility  Bed Mobility Overal bed mobility: Independent                  Transfers Overall transfer level: Independent                      Ambulation/Gait Ambulation/Gait assistance: Modified independent (Device/Increase time) Gait Distance (Feet): 320 Feet Assistive device: None Gait Pattern/deviations: WFL(Within Functional Limits) Gait velocity: decreased due to neuropathy.     General Gait Details: pt somewhat slow and one instance of imbalance noted, but pt able to self correct and be safe the rest of gait training.  Stairs            Wheelchair Mobility    Modified Rankin (Stroke Patients Only)       Balance                                             Pertinent Vitals/Pain Pain Assessment Pain Assessment: 0-10 Pain Score: 9  Pain Location: Headache Pain Descriptors / Indicators: Aching Pain Intervention(s): Monitored during session    Home Living Family/patient expects to be discharged to:: Private residence Living Arrangements: Spouse/significant other Available Help at Discharge: Family;Available PRN/intermittently Type of Home: House Home Access: Stairs to enter Entrance Stairs-Rails: Right Entrance Stairs-Number of Steps: 1   Home Layout: One level Home Equipment: None Additional Comments: Resides at [employer] at [facility]    Prior  Function Prior Level of Function : Independent/Modified Independent             Mobility Comments: Mod indep without assist device for ADLs, household and community mobilization; denies fall history       Hand Dominance   Dominant Hand: Right    Extremity/Trunk Assessment   Upper Extremity Assessment Upper Extremity Assessment: Overall WFL for tasks assessed    Lower Extremity Assessment Lower Extremity Assessment: Overall WFL for tasks assessed       Communication   Communication: No difficulties  Cognition Arousal/Alertness:  Awake/alert Behavior During Therapy: WFL for tasks assessed/performed Overall Cognitive Status: Within Functional Limits for tasks assessed                                          General Comments      Exercises     Assessment/Plan    PT Assessment Patient does not need any further PT services  PT Problem List         PT Treatment Interventions      PT Goals (Current goals can be found in the Care Plan section)  Acute Rehab PT Goals Patient Stated Goal: to go back to [facility] and be able to exercise without congestion and SOB. PT Goal Formulation: With patient Time For Goal Achievement: 05/04/22 Potential to Achieve Goals: Good    Frequency       Co-evaluation               AM-PAC PT "6 Clicks" Mobility  Outcome Measure Help needed turning from your back to your side while in a flat bed without using bedrails?: None Help needed moving from lying on your back to sitting on the side of a flat bed without using bedrails?: None Help needed moving to and from a bed to a chair (including a wheelchair)?: None Help needed standing up from a chair using your arms (e.g., wheelchair or bedside chair)?: None Help needed to walk in hospital room?: None Help needed climbing 3-5 steps with a railing? : None 6 Click Score: 24    End of Session Equipment Utilized During Treatment: Gait belt Activity Tolerance: Patient tolerated treatment well Patient left: in bed;with call bell/phone within reach;with family/visitor present Nurse Communication: Mobility status PT Visit Diagnosis: Unsteadiness on feet (R26.81);Muscle weakness (generalized) (M62.81)    Time: 1572-6203 PT Time Calculation (min) (ACUTE ONLY): 19 min   Charges:   PT Evaluation $PT Eval Low Complexity: 1 Low PT Treatments $Gait Training: 8-22 mins        Gwenlyn Saran, PT, DPT 04/20/22, 2:14 PM

## 2022-04-20 NOTE — Assessment & Plan Note (Signed)
On as needed Bentyl

## 2022-04-20 NOTE — Assessment & Plan Note (Signed)
On propranolol.

## 2022-04-20 NOTE — Assessment & Plan Note (Addendum)
as needed Fioricet

## 2022-04-20 NOTE — Assessment & Plan Note (Signed)
Continue Valium and Lexapro

## 2022-04-20 NOTE — Assessment & Plan Note (Signed)
On Protonix 

## 2022-04-20 NOTE — Assessment & Plan Note (Signed)
On aspirin and Plavix and Zetia

## 2022-04-21 DIAGNOSIS — G43719 Chronic migraine without aura, intractable, without status migrainosus: Secondary | ICD-10-CM | POA: Diagnosis not present

## 2022-04-21 DIAGNOSIS — I10 Essential (primary) hypertension: Secondary | ICD-10-CM

## 2022-04-21 DIAGNOSIS — R918 Other nonspecific abnormal finding of lung field: Secondary | ICD-10-CM | POA: Diagnosis not present

## 2022-04-21 DIAGNOSIS — J189 Pneumonia, unspecified organism: Secondary | ICD-10-CM | POA: Diagnosis not present

## 2022-04-21 DIAGNOSIS — I25110 Atherosclerotic heart disease of native coronary artery with unstable angina pectoris: Secondary | ICD-10-CM | POA: Diagnosis not present

## 2022-04-21 LAB — GLUCOSE, CAPILLARY: Glucose-Capillary: 142 mg/dL — ABNORMAL HIGH (ref 70–99)

## 2022-04-21 LAB — LEGIONELLA PNEUMOPHILA SEROGP 1 UR AG: L. pneumophila Serogp 1 Ur Ag: NEGATIVE

## 2022-04-21 LAB — C DIFFICILE QUICK SCREEN W PCR REFLEX
C Diff antigen: NEGATIVE
C Diff interpretation: NOT DETECTED
C Diff toxin: NEGATIVE

## 2022-04-21 LAB — PROCALCITONIN: Procalcitonin: <0.1 ng/mL

## 2022-04-21 MED ORDER — CEFDINIR 300 MG PO CAPS: 300.0000 mg | ORAL_CAPSULE | Freq: Two times a day (BID) | ORAL | 0 refills | Status: AC

## 2022-04-21 MED ORDER — AZITHROMYCIN 250 MG PO TABS: ORAL_TABLET | ORAL | 0 refills | Status: DC

## 2022-04-21 NOTE — Care Management (Signed)
  Transition of Care [facility]) Screening Note   Patient Details  Name: Grant Young Date of Birth: [birth year]   Transition of Care [facility]) CM/SW Contact:    Pete Pelt, RN Phone Number: 04/21/2022, 9:40 AM    Transition of Care Department [facility]) has reviewed patient and no TOC needs have been identified at this time. We will continue to monitor patient advancement through interdisciplinary progression rounds. If new patient transition needs arise, please place a TOC consult.

## 2022-04-21 NOTE — Progress Notes (Signed)
Pulmonary Medicine          Date: 04/21/2022,   MRN# [id number] Grant Young [birth year]        HISTORY OF PRESENT ILLNESS   Today he is not having respiratory difficulty, no significant cough, wheezing or fever. He is being worked up for c.diff. no frank diarrhea, + loose stools per wife. No blood. No abdominal pain.   PAST MEDICAL HISTORY   Past Medical History:  Diagnosis Date   Anxiety    Per New Patient Packet   Chronic back pain    Per New Patient Packet   Chronic heart disease    Per New Patient Packet   Depression    GERD (gastroesophageal reflux disease)    Headache    History of CT scan of brain 09/14/2018   Per New Patient Packet   History of depression    Per New Patient Packet   History of gastritis    Per New Patient Packet   History of headache    Per New Patient Packet   History of kidney stones    Per New Patient Packet   History of neuropathy    Per New Patient Packet   Hypertension    Per New Patient Packet   Insomnia    Kidney stone    Lumbar radicular pain    Per New Patient Packet   Lyme disease    Per New Patient Packet   Myocardial infarction [facility])    Overactive bladder    PONV (postoperative nausea and vomiting)    Skin cancer, basal cell    Sleep trouble    Per New Patient Packet   Spinal cord stimulator status    01/08/21 - not currently using.   Squamous cell skin cancer    Substance abuse ([place])      SURGICAL HISTORY   Past Surgical History:  Procedure Laterality Date   BACK SURGERY     CARDIAC CATHETERIZATION     CATARACT EXTRACTION W/PHACO Left 01/14/2021   Procedure: CATARACT EXTRACTION PHACO AND INTRAOCULAR LENS PLACEMENT ([place]) LEFT VIVITY TORIC LENS 8.75 00:56.7;  Surgeon: Birder Robson, MD;  Location: [place];  Service: Ophthalmology;  Laterality: Left;   CATARACT EXTRACTION W/PHACO Right 01/28/2021   Procedure: CATARACT EXTRACTION PHACO AND INTRAOCULAR LENS PLACEMENT ([place]) RIGHT VIVITY  TORIC LENS;  Surgeon: Birder Robson, MD;  Location: [place];  Service: Ophthalmology;  Laterality: Right;  6.54 00:46.4   CHOLECYSTECTOMY  2017   COLONOSCOPY  09/15/2015   Per New Patient Packet   COLONOSCOPY WITH PROPOFOL N/A 10/03/2020   Procedure: COLONOSCOPY WITH PROPOFOL;  Surgeon: [name], MD;  Location: [facility] ENDOSCOPY;  Service: Gastroenterology;  Laterality: N/A;   CORONARY/GRAFT ACUTE MI REVASCULARIZATION N/A 11/27/2021   Procedure: Coronary/Graft Acute MI Revascularization;  Surgeon: Leonie Man, MD;  Location: [place] CV LAB;  Service: Cardiovascular;  Laterality: N/A;   ESOPHAGOGASTRODUODENOSCOPY (EGD) WITH PROPOFOL N/A 10/03/2020   Procedure: ESOPHAGOGASTRODUODENOSCOPY (EGD) WITH PROPOFOL;  Surgeon: [name], MD;  Location: [facility] END[facility]COPY;  Service: Gastroenterology;  Laterality: N/A;   FOOT SURGERY Bilateral 03/18/2020   FOOT SURGERY  08/032021   GALLBLADDER SURGERY  09/15/2015   Gallbladder Removal. Procedure done by Dr.Beverly. Per New Patient Packet   KIDNEY STONE SURGERY  09/14/1980   Too many to count. Per New Patient Packet 09/14/1980-09/15/1995   KIDNEY STONE SURGERY  09/15/1995   Too many to count. Per New Patient Packet   LEFT HEART CATH  AND CORONARY ANGIOGRAPHY N/A 11/27/2021   Procedure: LEFT HEART CATH AND CORONARY ANGIOGRAPHY;  Surgeon: Leonie Man, MD;  Location: [place] CV LAB;  Service: Cardiovascular;  Laterality: N/A;   LITHOTRIPSY  09/14/2014   Per New Patient Packet   LITHOTRIPSY  09/14/1996   No Stints Used. Per New Patient Packet   LITHOTRIPSY  09/14/1997   No Stints used. Per New Patient Packet   PAIN PUMP IMPLANTATION     PAIN PUMP REMOVAL     SHOULDER SURGERY Right 1984   SIGMOIDOSCOPY  09/14/2017   Per New Patient Packet   SPINAL CORD STIMULATOR IMPLANT     TONSILLECTOMY     UPPER GI ENDOSCOPY       FAMILY HISTORY   Family History  Problem Relation Age of Onset   Heart disease Father    Heart  failure Father    Hypertension Father    Stroke Father    Stroke Mother    Dementia Mother    Kidney Stones Daughter    Anxiety disorder Daughter    OCD Daughter      SOCIAL HISTORY   Social History   Tobacco Use   Smoking status: Never   Smokeless tobacco: Never  Vaping Use   Vaping Use: Never used  Substance Use Topics   Alcohol use: Yes    Comment: 1 Drink a Month, socially   Drug use: Not Currently     MEDICATIONS    Home Medication:  Current Outpatient Rx   Order #: [id number] Class: Normal   [START ON 04/22/2022] Order #: [id number] Class: Normal    Current Medication:  Current Facility-Administered Medications:    acidophilus (RISAQUAD) capsule 1 capsule, 1 capsule, Oral, Daily, Gilles Chiquito B, MD, 1 capsule at 04/21/22 0943   albuterol (PROVENTIL) (2.5 MG/3ML) 0.083% nebulizer solution 2.5 mg, 2.5 mg, Nebulization, Q2H PRN, Gilles Chiquito B, MD   aspirin chewable tablet 81 mg, 81 mg, Oral, Daily, Gilles Chiquito B, MD, 81 mg at 04/21/22 0945   azithromycin (ZITHROMAX) 500 mg in sodium chloride 0.9 % 250 mL IVPB, 500 mg, Intravenous, Q24H, Gilles Chiquito B, MD, Last Rate: 250 mL/hr at 04/21/22 1035, 500 mg at 04/21/22 1035   butalbital-acetaminophen-caffeine (FIORICET) 50-325-40 MG per tablet 1 tablet, 1 tablet, Oral, Q6H PRN, Gilles Chiquito B, MD, 1 tablet at 04/20/22 0458   cefTRIAXone (ROCEPHIN) 2 g in sodium chloride 0.9 % 100 mL IVPB, 2 g, Intravenous, Q24H, Gilles Chiquito B, MD, Last Rate: 200 mL/hr at 04/21/22 0942, 2 g at 04/21/22 0942   clopidogrel (PLAVIX) tablet 75 mg, 75 mg, Oral, Daily, Gilles Chiquito B, MD, 75 mg at 04/21/22 0944   diazepam (VALIUM) tablet 10 mg, 10 mg, Oral, QHS, Gilles Chiquito B, MD, 10 mg at 04/20/22 2113   diazepam (VALIUM) tablet 5 mg, 5 mg, Oral, Daily, Gilles Chiquito B, MD, 5 mg at 04/21/22 0944   dicyclomine (BENTYL) tablet 20 mg, 20 mg, Oral, PRN, Gilles Chiquito B, MD   enoxaparin (LOVENOX) injection 40 mg, 40 mg, Subcutaneous, Q24H,  Gilles Chiquito B, MD, 40 mg at 04/20/22 2112   escitalopram (LEXAPRO) tablet 20 mg, 20 mg, Oral, QPM, Gilles Chiquito B, MD, 20 mg at 04/20/22 1838   ezetimibe (ZETIA) tablet 10 mg, 10 mg, Oral, Daily, Gilles Chiquito B, MD, 10 mg at 04/21/22 0943   guaiFENesin-dextromethorphan (ROBITUSSIN DM) 100-10 MG/5ML syrup 5 mL, 5 mL, Oral, Q4H PRN, Sid Falcon, MD   HYDROcodone-acetaminophen (NORCO/VICODIN) 5-325 MG per tablet 1  tablet, 1 tablet, Oral, Q6H PRN, Sid Falcon, MD, 1 tablet at 04/21/22 4259   loperamide (IMODIUM) capsule 2 mg, 2 mg, Oral, PRN, Athena Masse, MD, 2 mg at 04/20/22 1854   ondansetron (ZOFRAN) tablet 4 mg, 4 mg, Oral, Q6H PRN, Sid Falcon, MD   pantoprazole (PROTONIX) EC tablet 40 mg, 40 mg, Oral, Daily, Gilles Chiquito B, MD, 40 mg at 04/21/22 0943   polyethylene glycol (MIRALAX / GLYCOLAX) packet 17 g, 17 g, Oral, Daily PRN, Sid Falcon, MD   prochlorperazine (COMPAZINE) tablet 10 mg, 10 mg, Oral, Q8H PRN, Gilles Chiquito B, MD, 10 mg at 04/20/22 2121   propranolol (INDERAL) tablet 40 mg, 40 mg, Oral, BID, Gilles Chiquito B, MD, 40 mg at 04/21/22 5638   pyridostigmine (MESTINON) tablet 60 mg, 60 mg, Oral, BID, Gilles Chiquito B, MD, 60 mg at 04/21/22 0945   ramelteon (ROZEREM) tablet 8 mg, 8 mg, Oral, QHS, Gilles Chiquito B, MD, 8 mg at 04/20/22 2113   sodium chloride flush (NS) 0.9 % injection 3 mL, 3 mL, Intravenous, Q12H, Gilles Chiquito B, MD, 3 mL at 04/21/22 0946    ALLERGIES   Ace inhibitors, Fluoxetine, Metoclopramide, Nalbuphine, Other, Amoxicillin-pot clavulanate, Doxazosin, Duloxetine, Penicillins, Tamsulosin, Trazodone and nefazodone, Amlodipine, Cinoxacin, Ciprofloxacin, Fludrocortisone, Nebivolol, Olanzapine, Olmesartan, Pregabalin, Prostaglandins, Thyroid hormones, Zolpidem, Duloxetine hcl, Fluoxetine hcl, Nucynta [tapentadol], and Phenytoin     REVIEW OF SYSTEMS    Review of Systems:  Gen:  Denies  fever, sweats, chills weigh loss  HEENT: Denies  blurred vision, double vision, ear pain, eye pain, hearing loss, nose bleeds, sore throat Cardiac:  No dizziness, chest pain or heaviness, chest tightness,edema Resp:   Denies cough or sputum porduction, shortness of breath,wheezing, hemoptysis,  Gi: Denies swallowing difficulty, stomach pain, nausea or vomiting, diarrhea, constipation, bowel incontinence Gu:  Denies bladder incontinence, burning urine Ext:   Denies Joint pain, stiffness or swelling Skin: Denies  skin rash, easy bruising or bleeding or hives Endoc:  Denies polyuria, polydipsia , polyphagia or weight change Psych:   Denies depression, insomnia or hallucinations   Other:  All other systems negative   VS: BP (!) 145/87 (BP Location: Right Arm)   Pulse 67   Temp 98.5 F (36.9 C)   Resp 18   Ht '5\' 9"'$  (1.753 m)   Wt 60.5 kg   SpO2 95%   BMI 19.70 kg/m      PHYSICAL EXAM    GENERAL:NAD, no fevers, chills, no weakness no fatigue HEAD: Normocephalic, atraumatic.  EYES: Pupils equal, round, reactive to light. Extraocular muscles intact. No scleral icterus.  MOUTH: Moist mucosal membrane. Dentition intact. No abscess noted.  EAR, NOSE, THROAT: Clear without exudates. No external lesions.  NECK: Supple. No thyromegaly. No nodules. No JVD.  PULMONARY: decrease bs at right base CARDIOVASCULAR: S1 and S2. Regular rate and rhythm. No murmurs, rubs, or gallops. No edema. Pedal pulses 2+ bilaterally.  GASTROINTESTINAL: Soft, nontender, nondistended. No masses. Positive bowel sounds. No hepatosplenomegaly.  MUSCULOSKELETAL: No swelling, clubbing, or edema. Range of motion full in all extremities.  NEUROLOGIC: Cranial nerves II through XII are intact. No gross focal neurological deficits. Sensation intact. Reflexes intact.  SKIN: No ulceration, lesions, rashes, or cyanosis. Skin warm and dry. Turgor intact.  PSYCHIATRIC: Mood, affect within normal limits. The patient is awake, alert and oriented x 3. Insight, judgment intact.        IMAGING    CT Angio Chest PE W and/or Wo Contrast  Result Date: 04/19/2022 CLINICAL DATA:  Pt repots was seen a week ago dx'd with pneumonia and given medications. Pt reports has almost ocmpletred abx and his sx's have started to get worse. Pt reports continued to cough, still with fever, sore throat, headache and sob. EXAM: CT ANGIOGRAPHY CHEST WITH CONTRAST TECHNIQUE: Multidetector CT imaging of the chest was performed using the standard protocol during bolus administration of intravenous contrast. Multiplanar CT image reconstructions and MIPs were obtained to evaluate the vascular anatomy. RADIATION DOSE REDUCTION: This exam was performed according to the departmental dose-optimization program which includes automated exposure control, adjustment of the mA and/or kV according to patient size and/or use of iterative reconstruction technique. CONTRAST:  93m OMNIPAQUE IOHEXOL 350 MG/ML SOLN COMPARISON:  Chest radiographs, 04/15/2022 and older studies. FINDINGS: Cardiovascular: Pulmonary arteries are well opacified. There is no evidence of a pulmonary embolism. Heart is normal in size and configuration. Three-vessel coronary artery calcifications. No pericardial effusion. Ascending thoracic aorta measures 3.9 cm, just below the threshold for aneurysm. Mediastinum/Nodes: Normal thyroid. No neck base, mediastinal or hilar masses. There are enlarged right hilar lymph nodes. An anterior superior hilar node measures 1.6 cm. A right infrahilar node measures 1.3 cm in short axis. Trachea and esophagus are unremarkable. Lungs/Pleura: Masslike opacity in the posterior right lower lobe, centered on image 104, series 5, measuring 4.7 x 3.3 x 4.3 cm. There is hazy ground-glass opacity that surrounds this masslike opacity. Patchy peribronchovascular opacity extends from the anterior superior right hilum into the right upper lobe. There are associated ill-defined nodules. Small focal area of opacity noted in the  anterior left upper lobe image 83, series 5. Mild peribronchovascular opacity noted in the posterior left lower lobe with small adjacent nodules. Nodular type opacities noted in the posterior right middle lobe, largest 9 mm, image 104, series 5. Mild peribronchovascular opacities and nodular opacities in the left upper lobe lingula. No evidence of pulmonary edema. No pleural effusion or pneumothorax. Upper Abdomen: No acute abnormality. Musculoskeletal: No fracture or acute finding. No bone lesion. Posterior midthoracic spinal canal stimulator. Review of the MIP images confirms the above findings. IMPRESSION: 1. No evidence of a pulmonary embolism. 2. Bilateral lung opacities as detailed above, overall with an inflammatory/infectious appearance. Mass-like opacity in the posterior right lower lobe, likely due to infection. Multiple other small nodular opacities noted bilaterally, also likely infection or inflammation. Short-term follow-up is recommended to document improvement, with repeat chest CT in 4-6 weeks after treatment. 3. Right hilar lymphadenopathy, presumed reactive. Electronically Signed   By: DLajean ManesM.D.   On: 04/19/2022 11:07   CT HEAD WO CONTRAST (5MM)  Result Date: 04/19/2022 CLINICAL DATA:  Headache. EXAM: CT HEAD WITHOUT CONTRAST TECHNIQUE: Contiguous axial images were obtained from the base of the skull through the vertex without intravenous contrast. RADIATION DOSE REDUCTION: This exam was performed according to the departmental dose-optimization program which includes automated exposure control, adjustment of the mA and/or kV according to patient size and/or use of iterative reconstruction technique. COMPARISON:  07/30/2020 FINDINGS: Brain: There is no evidence for acute hemorrhage, hydrocephalus, mass lesion, or abnormal extra-axial fluid collection. No definite CT evidence for acute infarction. Diffuse loss of parenchymal volume is consistent with atrophy. Patchy low attenuation in  the deep hemispheric and periventricular white matter is nonspecific, but likely reflects chronic microvascular ischemic demyelination. 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 Vascular: No hyperdense vessel or unexpected calcification. Skull:  No evidence for fracture. No worrisome lytic or sclerotic lesion. Sinuses/Orbits: Small polypoid focus of mucosal disease identified left maxillary sinus, stable. Otherwise, the visualized paranasal sinuses and mastoid air cells are clear. Visualized portions of the globes and intraorbital fat are unremarkable. Other: None. IMPRESSION: 1. No acute intracranial abnormality. 2. 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 3. Atrophy with chronic small vessel ischemic disease. Electronically Signed   By: Misty Stanley M.D.   On: 04/19/2022 11:00   DG Chest 2 View  Result Date: 04/15/2022 CLINICAL DATA:  Cough x6 months with mild shortness of breath. EXAM: CHEST - 2 VIEW COMPARISON:  November 27, 2021 FINDINGS: The heart size and mediastinal contours are within normal limits. Coronary artery stents are in place. The lungs are hyperinflated. Mild atelectasis is seen within the bilateral lung bases. Mild atelectasis and/or infiltrate is seen within the mid right lung. This is increased in severity when compared to the prior study. There is no evidence of a pleural effusion or pneumothorax. A radiopaque surgical screw is seen within the region of the right glenoid. IMPRESSION: 1. Mild bibasilar atelectasis with mild mid right lung atelectasis and/or infiltrate, as described above. Further evaluation with chest CT is recommended. Electronically Signed   By: Virgina Norfolk M.D.   On: 04/15/2022 17:10      ASSESSMENT/PLAN   This is 76 yr old male with med and hilar noes and a large right lower lung mass like  opacity. Pneumonia with reactive nodes, post obstructive pn vs aspiration. ? Underlying malignancy,  -would treat for pneumonia ( rocephin/zithro)  -repeat chest ct vs pet in 4 weeks -bronch per response (reviewed with the bronch team) -incentive spirometry -see [name] in pulmonary clinic in 2 weeks with cxr -ace, lymphoma screen, quantiferon tb,  - ok to go home on broad spectrum anitibiotics ( ceftin/zithro) - stool sent for c.diff ( having loose stools)     Thank you for allowing me to participate in the care of this patient.   Patient/Family are satisfied with care plan and all questions have been answered.  This document was prepared using Dragon voice recognition software and may include unintentional dictation errors.     Wallene Huh, M.D.  Division of [place]

## 2022-04-21 NOTE — Discharge Summary (Signed)
Physician Discharge Summary   Patient: Grant Young MRN: [id number] DOB: 06-22-1946  Admit date:     04/19/2022  Discharge date: 04/21/22  Discharge Physician: Loletha Grayer   PCP: [name], NP   Recommendations at discharge:   Follow-up PCP 5 days Follow-up Dr. 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  Patient was admitted with multifocal pneumonia started on Rocephin and Zithromax.  Received 3 days here in the hospital.  Blood cultures were negative.  Patient will be switched over to [facility] and Zithromax for 2 more days upon discharge.  CT scan of the chest showed bilateral lung opacities with a masslike opacity in the posterior right lower lobe measuring 4.7 x 3.3 x 4.3 cm.  Patient also had smaller nodular opacities noted bilaterally.  Short interim follow-up recommended to ensure clearing.  Patient did have lymphadenopathy.  I consulted Dr. Raul Del pulmonology and he will follow-up as outpatient.  The patient will have repeat imaging as outpatient.  May end up needing a PET CT scan versus a bronchoscopy as outpatient.  Patient had some diarrhea on the day of discharge and stool for C. difficile was sent and is negative.  Assessment and Plan: * Multifocal pneumonia Patient on Rocephin and Zithromax.  Send MRSA PCR.  Received 1 dose of vancomycin in the  ED.  Right lower lobe lung mass Will need treatment for pneumonia and then potentially a outpatient bronchoscopy if this does not clear up with treatment of pneumonia.  Case discussed with Dr. Raul Del pulmonology to see patient.  Smaller nodular opacities noted bilaterally which could be infection versus inflammation.  Will need short-term follow-up with repeat CT scan of the chest in 4 to 6 weeks.  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(HCC) On aspirin and Plavix and Zetia  Chronic migraine without aura as needed Fioricet  IBS (irritable bowel syndrome) On as needed Bentyl  GERD (gastroesophageal reflux disease) On Protonix  Anxiety and depression Continue Valium and Lexapro  Hyperlipidemia LDL goal <70 Continue Zetia  Tremor On propranolol         Consultants: Pulmonary Procedures performed: None Disposition: Home Diet recommendation:  Regular diet DISCHARGE MEDICATION: Allergies as of 04/21/2022       Reactions   Ace Inhibitors    Other reaction(s): Cough   Fluoxetine Anxiety   "made me fall asleep" per pt "bad headaches and "makes  Me  Crazy" historical allergy noted in [employer] "made me fall asleep" per pt "bad headaches and "makes  Me  Crazy" Per New Patient Packet.    Metoclopramide    Other reaction(s): Other (See Comments), Other (See Comments), Unknown Tardive Dyskinesia  historical allergy noted in [employer] Tardive Dyskinesia  Per New Patient Packet.   Nalbuphine    Used Post Back surgery- Anesthesiologist Error. Patient had Narcotic Withdraw. Per New  Patient Packet.    Other    Other reaction(s): Other (See Comments) Altered mental status in combo with narcotics at previous hospitalization - Full Withdrawal Symptoms Other reaction(s): Rash   Amoxicillin-pot Clavulanate Nausea Only   Per New Patient Packet.   Doxazosin Rash   Other reaction(s): Other - See Comments,  Rash UNKNOWN REACTION UNKNOWN REACTION   Duloxetine Nausea Only   Per New Patient Packet.    Penicillins Nausea Only   Per New Patient Packet.   Tamsulosin Itching, Anxiety   Restless, Flushing, Heavy Chest, Itching, Hyperactive mood and Anxiety. Unable to handle side effects. Per New Patient Packet.    Trazodone And Nefazodone Itching, Anxiety, Rash   Headache. "INCREASED MY ANXIETY AND HEARTRATE" Flushing, tachycardia "INCREASED MY ANXIETY AND HEARTRATE" Per New Patient Packet.   Amlodipine    Shaking, unsure of reaction. Per New Patient Packet.    Cinoxacin    GI Intolerance, and Dizziness. Per New Patient Packet.    Ciprofloxacin    Other reaction(s): Unknown   Fludrocortisone Other (See Comments)   "Worsening headaches, GI issues, fatigue"   Nebivolol    Other reaction(s): Unknown   Olanzapine    Headache and unable to sleep for 3 nights. Per New Patient Packet.   Olmesartan    Other reaction(s): Unknown   Pregabalin    Confusion, Lack of concentration, dizziness, and likely drowsiness. Per New Patient Packet.    Prostaglandins    Other reaction(s): Other (See Comments) Intolerance   Thyroid Hormones    Other reaction(s): Other (See Comments) Thyroid (Nature Thyroid) contraindicated with some of your other medications.   Zolpidem    Nightmares, Ineffective after 2 days. Per New Patient Packet.    Duloxetine Hcl    Other reaction(s): Rash   Fluoxetine Hcl    Other reaction(s): Rash   Nucynta [tapentadol] Other (See Comments)   Vertigo    Phenytoin Anxiety   Hyperactivity, and Ineffective. Per New Patient Packet.         Medication List     STOP taking these medications    doxycycline 100 MG tablet Commonly known as: VIBRA-TABS   levofloxacin 500 MG tablet Commonly known as: LEVAQUIN       TAKE these medications    anastrozole 1 MG tablet Commonly known as: ARIMIDEX 1/2 tablet (0.5 mg) by mouth once weekly   aspirin 81 MG chewable tablet Chew  1 tablet (81 mg total) by mouth daily.   azithromycin 250 MG tablet Commonly known as: Zithromax One tab po daily for two days   butalbital-acetaminophen-caffeine 50-325-40 MG tablet Commonly known as: FIORICET Take 1 tablet by mouth every 6 (six) hours as needed for headache.   cefdinir 300 MG capsule Commonly known as: OMNICEF Take 1 capsule (300 mg total) by mouth 2 (two) times daily for 2 days. Start taking on: April 22, 2022   cetirizine 10 MG tablet Commonly known as: ZYRTEC Take 10 mg by mouth daily as needed for allergies.   Cholecalciferol 50 MCG (2000 UT) Caps Take 1 tablet by mouth daily.   clopidogrel 75 MG tablet Commonly known as: PLAVIX Take 1 tablet (75 mg) by mouth once daily   DAYQUIL/NYQUIL COLD/FLU RELIEF PO Take 5 mLs by mouth at bedtime.   DHEA 25 MG Caps Take 25 mg by mouth daily.   diazepam 5 MG tablet Commonly known as: VALIUM TAKE 1 TABLET IN THE MORNING AND 2 TABLETS AT BEDTIME   dicyclomine 20 MG tablet Commonly known  as: BENTYL Take 20 mg by mouth as needed.   DIGESTION GB PO Take 1 capsule by mouth daily.   escitalopram 20 MG tablet Commonly known as: LEXAPRO TAKE 1 TABLET BY MOUTH EVERY DAY   ezetimibe 10 MG tablet Commonly known as: ZETIA Take 10 mg by mouth daily.   guaiFENesin 200 MG tablet Take 200 mg by mouth every 4 (four) hours as needed for cough or to loosen phlegm.   HYDROcodone-acetaminophen 10-325 MG tablet Commonly known as: NORCO Take 1 tablet by mouth 3 (three) times daily as needed. Start taking on: May 04, 2022   IMODIUM PO Take by mouth as needed.   ipratropium 0.06 % nasal spray Commonly known as: ATROVENT Place 1 spray into both nostrils 4 (four) times daily as needed for rhinitis. For up to 5-7 days then stop.   MAGNESIUM BISGLYCINATE PO Take by mouth daily.   melatonin 5 MG Tabs Take 5 mg by mouth at bedtime.   NONFORMULARY OR COMPOUNDED ITEM Super Bi-Mix Injection  Papaverine HCI '30mg'$ /mL  Phentolamine Mesylate '1mg'$ /mL Inject 0.4m - 124mIncrease as needed to achieve erection   NUTRITIONAL SUPPLEMENT PO Take 1 capsule by mouth daily. [name]   NUTRITIONAL SUPPLEMENT PO Take by mouth daily. InflammaSaver   ondansetron 4 MG tablet Commonly known as: ZOFRAN Take 4 mg by mouth every 6 (six) hours as needed for nausea or vomiting.   polyethylene glycol 17 g packet Commonly known as: MIRALAX / GLYCOLAX Take 17 g by mouth daily as needed.   PROBIOTIC DAILY PO Take by mouth daily.   prochlorperazine 10 MG tablet Commonly known as: COMPAZINE Take 1 tablet (10 mg total) by mouth every 8 (eight) hours as needed for nausea or vomiting. TAKE 1 TABLET (10 MG TOTAL) BY MOUTH EVERY 6 (SIX) HOURS AS NEEDED for HEADACHE AND NAUSEA, LIMIT USE TO 3 DAYS PER WEEK   propranolol 40 MG tablet Commonly known as: INDERAL Take 1 tablet (40 mg total) by mouth 2 (two) times daily.   pyridostigmine 60 MG tablet Commonly known as: MESTINON TAKE 1 TABLET BY MOUTH TWICE A DAY   Qulipta 10 MG Tabs Generic drug: Atogepant Take 10 mg by mouth daily.   RABEprazole 20 MG tablet Commonly known as: ACIPHEX TAKE 1 TABLET BY MOUTH TWICE A DAY   sucralfate 1 g tablet Commonly known as: CARAFATE Take 1 g by mouth 4 (four) times daily as needed.   testosterone cypionate 200 MG/ML injection Commonly known as: DEPOTESTOSTERONE CYPIONATE Inject 80 mg into the muscle every 14 (fourteen) days.   valACYclovir 1000 MG tablet Commonly known as: VALTREX Take 2 tabs p.o. and repeat in 12 hours as needed for cold sore   Vitamin B-12 5000 MCG Lozg Take 1 lozenge by mouth daily.        Follow-up Information     BaJearld FentonNP. Go in 5 day(s).   Specialties: Internal Medicine, Emergency Medicine Why: The number for BaJearld FentonNP is [phone] this number to schedule appt. Contact information: [phone]          FlErby PianMD Follow up in 2 week(s).   Specialty: Specialist Contact information: [address] [phone]              Discharge Exam: Filed Weights   04/19/22 1623 04/20/22 0424 04/21/22 0359  Weight: 61.6 kg 61.1 kg 60.5  kg   Physical Exam HENT:     Head: Normocephalic.     Mouth/Throat:     Pharynx: No oropharyngeal exudate.  Eyes:     General: Lids are normal.     Conjunctiva/sclera: Conjunctivae normal.  Cardiovascular:     Rate and Rhythm: Normal rate and regular rhythm.     Heart sounds: Normal heart sounds, S1 normal and S2 normal.  Pulmonary:     Breath sounds: No decreased breath sounds, wheezing, rhonchi or rales.  Abdominal:     Palpations: Abdomen is soft.     Tenderness: There is no abdominal tenderness.  Musculoskeletal:     Right lower leg: No swelling.     Left lower leg: No swelling.  Skin:    General: Skin is warm.     Findings: No rash.  Neurological:     Mental Status: He is alert and oriented to person, place, and time.      Condition at discharge: stable  The results of significant diagnostics from this hospitalization (including imaging, microbiology, ancillary and laboratory) are listed below for reference.   Imaging Studies: CT Angio Chest PE W and/or Wo Contrast  Result Date: 04/19/2022 CLINICAL DATA:  Pt repots was seen a week ago dx'd with pneumonia and given medications. Pt reports has almost ocmpletred abx and his sx's have started to get worse. Pt reports continued to cough, still with fever, sore throat, headache and sob. EXAM: CT ANGIOGRAPHY CHEST WITH CONTRAST TECHNIQUE: Multidetector CT imaging of the chest was performed using the standard protocol during bolus administration of intravenous contrast. Multiplanar CT image reconstructions and MIPs were obtained to evaluate the vascular anatomy. RADIATION DOSE REDUCTION: This exam was performed according to the departmental  dose-optimization program which includes automated exposure control, adjustment of the mA and/or kV according to patient size and/or use of iterative reconstruction technique. CONTRAST:  64m OMNIPAQUE IOHEXOL 350 MG/ML SOLN COMPARISON:  Chest radiographs, 04/15/2022 and older studies. FINDINGS: Cardiovascular: Pulmonary arteries are well opacified. There is no evidence of a pulmonary embolism. Heart is normal in size and configuration. Three-vessel coronary artery calcifications. No pericardial effusion. Ascending thoracic aorta measures 3.9 cm, just below the threshold for aneurysm. Mediastinum/Nodes: Normal thyroid. No neck base, mediastinal or hilar masses. There are enlarged right hilar lymph nodes. An anterior superior hilar node measures 1.6 cm. A right infrahilar node measures 1.3 cm in short axis. Trachea and esophagus are unremarkable. Lungs/Pleura: Masslike opacity in the posterior right lower lobe, centered on image 104, series 5, measuring 4.7 x 3.3 x 4.3 cm. There is hazy ground-glass opacity that surrounds this masslike opacity. Patchy peribronchovascular opacity extends from the anterior superior right hilum into the right upper lobe. There are associated ill-defined nodules. Small focal area of opacity noted in the anterior left upper lobe image 83, series 5. Mild peribronchovascular opacity noted in the posterior left lower lobe with small adjacent nodules. Nodular type opacities noted in the posterior right middle lobe, largest 9 mm, image 104, series 5. Mild peribronchovascular opacities and nodular opacities in the left upper lobe lingula. No evidence of pulmonary edema. No pleural effusion or pneumothorax. Upper Abdomen: No acute abnormality. Musculoskeletal: No fracture or acute finding. No bone lesion. Posterior midthoracic spinal canal stimulator. Review of the MIP images confirms the above findings. IMPRESSION: 1. No evidence of a pulmonary embolism. 2. Bilateral lung opacities as detailed  above, overall with an inflammatory/infectious appearance. Mass-like opacity in the posterior right lower lobe, likely due to infection.  Multiple other small nodular opacities noted bilaterally, also likely infection or inflammation. Short-term follow-up is recommended to document improvement, with repeat chest CT in 4-6 weeks after treatment. 3. Right hilar lymphadenopathy, presumed reactive. Electronically Signed   By: Lajean Manes M.D.   On: 04/19/2022 11:07   CT HEAD WO CONTRAST (5MM)  Result Date: 04/19/2022 CLINICAL DATA:  Headache. EXAM: CT HEAD WITHOUT CONTRAST TECHNIQUE: Contiguous axial images were obtained from the base of the skull through the vertex without intravenous contrast. RADIATION DOSE REDUCTION: This exam was performed according to the departmental dose-optimization program which includes automated exposure control, adjustment of the mA and/or kV according to patient size and/or use of iterative reconstruction technique. COMPARISON:  07/30/2020 FINDINGS: Brain: There is no evidence for acute hemorrhage, hydrocephalus, mass lesion, or abnormal extra-axial fluid collection. No definite CT evidence for acute infarction. Diffuse loss of parenchymal volume is consistent with atrophy. Patchy low attenuation in the deep hemispheric and periventricular white matter is nonspecific, but likely reflects chronic microvascular ischemic demyelination. 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 Vascular: No hyperdense vessel or unexpected calcification. Skull: No evidence for fracture. No worrisome lytic or sclerotic lesion. Sinuses/Orbits: Small polypoid focus of mucosal disease identified left maxillary sinus, stable. Otherwise, the visualized paranasal sinuses and mastoid air cells are clear. Visualized portions of the globes and intraorbital fat are unremarkable. Other: None. IMPRESSION: 1. No acute intracranial  abnormality. 2. 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 3. Atrophy with chronic small vessel ischemic disease. Electronically Signed   By: Misty Stanley M.D.   On: 04/19/2022 11:00   DG Chest 2 View  Result Date: 04/15/2022 CLINICAL DATA:  Cough x6 months with mild shortness of breath. EXAM: CHEST - 2 VIEW COMPARISON:  November 27, 2021 FINDINGS: The heart size and mediastinal contours are within normal limits. Coronary artery stents are in place. The lungs are hyperinflated. Mild atelectasis is seen within the bilateral lung bases. Mild atelectasis and/or infiltrate is seen within the mid right lung. This is increased in severity when compared to the prior study. There is no evidence of a pleural effusion or pneumothorax. A radiopaque surgical screw is seen within the region of the right glenoid. IMPRESSION: 1. Mild bibasilar atelectasis with mild mid right lung atelectasis and/or infiltrate, as described above. Further evaluation with chest CT is recommended. Electronically Signed   By: Virgina Norfolk M.D.   On: 04/15/2022 17:10    Microbiology: Results for orders placed or performed during the hospital encounter of 04/19/22  SARS Coronavirus 2 by RT PCR (hospital order, performed in [facility] hospital lab) *cepheid single result test* Anterior Nasal Swab     Status: None   Collection Time: 04/19/22 12:06 PM   Specimen: Anterior Nasal Swab  Result Value Ref Range Status   SARS Coronavirus 2 by RT PCR NEGATIVE NEGATIVE Final    Comment: (NOTE) SARS-CoV-2 target nucleic acids are NOT DETECTED.  The SARS-CoV-2 RNA is generally detectable in upper and lower respiratory specimens during the acute phase of infection. The lowest concentration of SARS-CoV-2 viral copies this assay can detect is 250 copies / mL. A negative result does not preclude SARS-CoV-2 infection and should not be used as the sole basis for  treatment or other patient management decisions.  A negative result may occur with improper specimen collection / handling, submission of specimen other  than nasopharyngeal swab, presence of viral mutation(s) within the areas targeted by this assay, and inadequate number of viral copies (<250 copies / mL). A negative result must be combined with clinical observations, patient history, and epidemiological information.  Fact Sheet for Patients:   https://www.patel.info/  Fact Sheet for Healthcare Providers: https://hall.com/  This test is not yet approved or  cleared by the [place] FDA and has been authorized for detection and/or diagnosis of SARS-CoV-2 by FDA under an Emergency Use Authorization (EUA).  This EUA will remain in effect (meaning this test can be used) for the duration of the COVID-19 declaration under Section 564(b)(1) of the Act, 21 U.S.C. section 360bbb-3(b)(1), unless the authorization is terminated or revoked sooner.  Performed at [facility], [address] [place], [place] [address]   MRSA Next Gen by PCR, Nasal     Status: None   Collection Time: 04/19/22  4:36 PM   Specimen: Nasal Mucosa; Nasal Swab  Result Value Ref Range Status   MRSA by PCR Next Gen NOT DETECTED NOT DETECTED Final    Comment: (NOTE) The GeneXpert MRSA Assay (FDA approved for NASAL specimens only), is one component of a comprehensive MRSA colonization surveillance program. It is not intended to diagnose MRSA infection nor to guide or monitor treatment for MRSA infections. Test performance is not FDA approved in patients less than 28 years old. Performed at [facility], [address] [place], [place] [address]   Expectorated Sputum Assessment w Gram Stain, Rflx to Resp Cult     Status: None   Collection Time: 04/19/22  4:36 PM   Specimen: Sputum  Result Value Ref Range Status   Specimen Description SPUTUM  Final    Special Requests NONE  Final   Sputum evaluation   Final    Sputum specimen not acceptable for testing.  Please recollect.   RESULT CALLED TO, READ BACK BY AND VERIFIED WITH: JO DOROTHY AT [id number] ON 04/20/22 BY SS Performed at [facility], [address] [place], [place] [address]    Report Status 04/20/2022 FINAL  Final  Culture, blood (routine x 2) Call MD if unable to obtain prior to antibiotics being given     Status: None (Preliminary result)   Collection Time: 04/19/22  4:53 PM   Specimen: BLOOD  Result Value Ref Range Status   Specimen Description BLOOD RIGHT [facility]  Final   Special Requests   Final    BOTTLES DRAWN AEROBIC AND ANAEROBIC Blood Culture adequate volume   Culture   Final    NO GROWTH 2 DAYS Performed at [facility], [address] [place], [place] [address]    Report Status PENDING  Incomplete  Culture, blood (routine x 2) Call MD if unable to obtain prior to antibiotics being given     Status: None (Preliminary result)   Collection Time: 04/19/22  4:53 PM   Specimen: BLOOD  Result Value Ref Range Status   Specimen Description BLOOD LEFT ARM  Final   Special Requests   Final    BOTTLES DRAWN AEROBIC AND ANAEROBIC Blood Culture adequate volume   Culture   Final    NO GROWTH 2 DAYS Performed at [facility], [address] [place],  [address]    Report Status PENDING  Incomplete  Expectorated Sputum Assessment w Gram Stain, Rflx to Resp Cult     Status: None   Collection Time: 04/19/22  9:39 PM   Specimen: Expectorated Sputum  Result Value Ref Range Status   Specimen Description EXPECTORATED SPUTUM  Final   Special Requests NONE  Final   Sputum evaluation   Final    Sputum specimen not acceptable for testing.  Please recollect.   NOTIFIED Tana Felts RN [phone] 04/20/22 HNM Performed at [facility], [address] [place], [place] [address]    Report Status 04/20/2022 FINAL  Final  C Difficile Quick Screen w  PCR reflex     Status: None   Collection Time: 04/21/22  1:16 PM   Specimen: STOOL  Result Value Ref Range Status   C Diff antigen NEGATIVE NEGATIVE Final   C Diff toxin NEGATIVE NEGATIVE Final   C Diff interpretation No C. difficile detected.  Final    Comment: Performed at [facility], [address] [place], [place] [address]    Labs: CBC: Recent Labs  Lab 04/19/22 [phone] 04/19/22 1653 04/20/22 0535  WBC 9.7 7.9 7.8  NEUTROABS 7.3  --   --   HGB 14.5 14.6 13.2  HCT 44.3 45.4 39.8  MCV 87.9 87.8 85.8  PLT 286 294 329   Basic Metabolic Panel: Recent Labs  Lab 04/19/22 0952 04/19/22 1653 04/20/22 0535  NA 141  --  143  K 3.7  --  3.5  CL 108  --  108  CO2 24  --  26  GLUCOSE 103*  --  95  BUN 15  --  14  CREATININE 0.73 0.84 0.68  CALCIUM 9.3  --  9.0   Liver Function Tests: Recent Labs  Lab 04/19/22 0952 04/20/22 0535  AST 18 14*  ALT 16 14  ALKPHOS 109 94  BILITOT 0.6 0.7  PROT 6.7 5.8*  ALBUMIN 3.5 3.0*   CBG: Recent Labs  Lab 04/20/22 0813 04/21/22 1015  GLUCAP 87 142*    Discharge time spent: greater than 30 minutes.  Signed: Loletha Grayer, MD [facility] 04/21/2022

## 2022-04-22 ENCOUNTER — Ambulatory Visit (INDEPENDENT_AMBULATORY_CARE_PROVIDER_SITE_OTHER): Payer: Medicare Other | Admitting: Internal Medicine

## 2022-04-22 ENCOUNTER — Encounter: Payer: Self-pay | Admitting: Internal Medicine

## 2022-04-22 ENCOUNTER — Telehealth: Payer: Self-pay

## 2022-04-22 VITALS — BP 136/82 | HR 68 | Temp 97.7°F | Wt 136.0 lb

## 2022-04-22 DIAGNOSIS — I249 Acute ischemic heart disease, unspecified: Secondary | ICD-10-CM

## 2022-04-22 DIAGNOSIS — J189 Pneumonia, unspecified organism: Secondary | ICD-10-CM

## 2022-04-22 DIAGNOSIS — R9389 Abnormal findings on diagnostic imaging of other specified body structures: Secondary | ICD-10-CM

## 2022-04-22 LAB — ANGIOTENSIN CONVERTING ENZYME: Angiotensin-Converting Enzyme: 33 U/L (ref 14–82)

## 2022-04-22 NOTE — Telephone Encounter (Signed)
Pt already has had hospital FU appointment today

## 2022-04-22 NOTE — Patient Instructions (Signed)

## 2022-04-22 NOTE — Progress Notes (Signed)
Subjective:    Patient ID: Grant Young, male    DOB: 17-Jun-1946, 76 y.o.   MRN: [id number]  HPI  Patient presents to clinic today for hospital follow-up.  He presented to the ER 8/6 with complaint of persistent cough.  Chest x-ray done prior to admission showed pneumonia and he was started on treatment with Levaquin.  CT of the chest showed a masslike opacity in the right lower lobe.  Pulmonology was consulted.  He was changed to Rocephin and azithromycin.  Blood cultures were negative.  He was switched to oral Omnicef and Azithromycin as an outpatient.  Pulmonology did recommend short-term follow-up with a repeat CT versus bronchoscopy as an outpatient.  He was discharged 8/8.  Since that time, he reports he is still feeling very fatigued, with nasal congestion and intermittent cough. He is taking his antibiotics as prescribed. He has a follow up with pulmonology scheduled.  Review of Systems     Past Medical History:  Diagnosis Date   Anxiety    Per New Patient Packet   Chronic back pain    Per New Patient Packet   Chronic heart disease    Per New Patient Packet   Depression    GERD (gastroesophageal reflux disease)    Headache    History of CT scan of brain 09/14/2018   Per New Patient Packet   History of depression    Per New Patient Packet   History of gastritis    Per New Patient Packet   History of headache    Per New Patient Packet   History of kidney stones    Per New Patient Packet   History of neuropathy    Per New Patient Packet   Hypertension    Per New Patient Packet   Insomnia    Kidney stone    Lumbar radicular pain    Per New Patient Packet   Lyme disease    Per New Patient Packet   Myocardial infarction [facility])    Overactive bladder    PONV (postoperative nausea and vomiting)    Skin cancer, basal cell    Sleep trouble    Per New Patient Packet   Spinal cord stimulator status    01/08/21 - not currently using.   Squamous cell skin cancer     Substance abuse (HCC)     Current Outpatient Medications  Medication Sig Dispense Refill   anastrozole (ARIMIDEX) 1 MG tablet 1/2 tablet (0.5 mg) by mouth once weekly     aspirin 81 MG chewable tablet Chew 1 tablet (81 mg total) by mouth daily. 90 tablet 3   azithromycin (ZITHROMAX) 250 MG tablet One tab po daily for two days 2 each 0   butalbital-acetaminophen-caffeine (FIORICET) 50-325-40 MG tablet Take 1 tablet by mouth every 6 (six) hours as needed for headache. 90 tablet 0   cefdinir (OMNICEF) 300 MG capsule Take 1 capsule (300 mg total) by mouth 2 (two) times daily for 2 days. 4 capsule 0   cetirizine (ZYRTEC) 10 MG tablet Take 10 mg by mouth daily as needed for allergies.     Cholecalciferol 50 MCG (2000 UT) CAPS Take 1 tablet by mouth daily.     clopidogrel (PLAVIX) 75 MG tablet Take 1 tablet (75 mg) by mouth once daily 90 tablet 2   Cyanocobalamin (VITAMIN B-12) 5000 MCG LOZG Take 1 lozenge by mouth daily.      DHEA 25 MG CAPS Take 25 mg by mouth daily.  diazepam (VALIUM) 5 MG tablet TAKE 1 TABLET IN THE MORNING AND 2 TABLETS AT BEDTIME 90 tablet 0   dicyclomine (BENTYL) 20 MG tablet Take 20 mg by mouth as needed.     Digestive Aids Mixture (DIGESTION GB PO) Take 1 capsule by mouth daily.     escitalopram (LEXAPRO) 20 MG tablet TAKE 1 TABLET BY MOUTH EVERY DAY 90 tablet 0   ezetimibe (ZETIA) 10 MG tablet Take 10 mg by mouth daily.     guaiFENesin 200 MG tablet Take 200 mg by mouth every 4 (four) hours as needed for cough or to loosen phlegm.     [START ON 05/04/2022] HYDROcodone-acetaminophen (NORCO) 10-325 MG tablet Take 1 tablet by mouth 3 (three) times daily as needed. 90 tablet 0   ipratropium (ATROVENT) 0.06 % nasal spray Place 1 spray into both nostrils 4 (four) times daily as needed for rhinitis. For up to 5-7 days then stop. 15 mL 0   Loperamide HCl (IMODIUM PO) Take by mouth as needed.      MAGNESIUM BISGLYCINATE PO Take by mouth daily.     melatonin 5 MG TABS Take 5 mg  by mouth at bedtime.     NONFORMULARY OR COMPOUNDED ITEM Super Bi-Mix Injection  Papaverine HCI '30mg'$ /mL Phentolamine Mesylate '1mg'$ /mL Inject 0.37m - 154mIncrease as needed to achieve erection 5 each 6   Nutritional Supplements (NUTRITIONAL SUPPLEMENT PO) Take 1 capsule by mouth daily. [name]     Nutritional Supplements (NUTRITIONAL SUPPLEMENT PO) Take by mouth daily. InflammaSaver     ondansetron (ZOFRAN) 4 MG tablet Take 4 mg by mouth every 6 (six) hours as needed for nausea or vomiting.     polyethylene glycol (MIRALAX / GLYCOLAX) 17 g packet Take 17 g by mouth daily as needed.     Probiotic Product (PROBIOTIC DAILY PO) Take by mouth daily.     prochlorperazine (COMPAZINE) 10 MG tablet Take 1 tablet (10 mg total) by mouth every 8 (eight) hours as needed for nausea or vomiting. TAKE 1 TABLET (10 MG TOTAL) BY MOUTH EVERY 6 (SIX) HOURS AS NEEDED for HEADACHE AND NAUSEA, LIMIT USE TO 3 DAYS PER WEEK 90 tablet 0   propranolol (INDERAL) 40 MG tablet Take 1 tablet (40 mg total) by mouth 2 (two) times daily. 180 tablet 1   Pseudoeph-Doxylamine-DM-APAP (DAYQUIL/NYQUIL COLD/FLU RELIEF PO) Take 5 mLs by mouth at bedtime.     pyridostigmine (MESTINON) 60 MG tablet TAKE 1 TABLET BY MOUTH TWICE A DAY 180 tablet 0   QULIPTA 10 MG TABS Take 10 mg by mouth daily.     RABEprazole (ACIPHEX) 20 MG tablet TAKE 1 TABLET BY MOUTH TWICE A DAY 180 tablet 0   sucralfate (CARAFATE) 1 g tablet Take 1 g by mouth 4 (four) times daily as needed.      testosterone cypionate (DEPOTESTOSTERONE CYPIONATE) 200 MG/ML injection Inject 80 mg into the muscle every 14 (fourteen) days.     valACYclovir (VALTREX) 1000 MG tablet Take 2 tabs p.o. and repeat in 12 hours as needed for cold sore 30 tablet 0   No current facility-administered medications for this visit.    Allergies  Allergen Reactions   Ace Inhibitors     Other reaction(s): Cough   Fluoxetine Anxiety    "made me fall asleep" per pt "bad headaches and  "makes  Me  Crazy" historical allergy noted in [employer] "made me fall asleep" per pt "bad headaches and "makes  Me  Crazy" Per  New Patient Packet.     Metoclopramide     Other reaction(s): Other (See Comments), Other (See Comments), Unknown Tardive Dyskinesia  historical allergy noted in [employer] Tardive Dyskinesia  Per New Patient Packet.   Nalbuphine     Used Post Back surgery- Anesthesiologist Error. Patient had Narcotic Withdraw. Per New Patient Packet.    Other     Other reaction(s): Other (See Comments) Altered mental status in combo with narcotics at previous hospitalization - Full Withdrawal Symptoms Other reaction(s): Rash   Amoxicillin-Pot Clavulanate Nausea Only    Per New Patient Packet.   Doxazosin Rash    Other reaction(s): Other - See Comments, Rash UNKNOWN REACTION UNKNOWN REACTION    Duloxetine Nausea Only    Per New Patient Packet.    Penicillins Nausea Only    Per New Patient Packet.   Tamsulosin Itching and Anxiety    Restless, Flushing, Heavy Chest, Itching, Hyperactive mood and Anxiety. Unable to handle side effects. Per New Patient Packet.     Trazodone And Nefazodone Itching, Anxiety and Rash    Headache. "INCREASED MY ANXIETY AND HEARTRATE" Flushing, tachycardia "INCREASED MY ANXIETY AND HEARTRATE" Per New Patient Packet.    Amlodipine     Shaking, unsure of reaction. Per New Patient Packet.     Cinoxacin     GI Intolerance, and Dizziness. Per New Patient Packet.    Ciprofloxacin     Other reaction(s): Unknown   Fludrocortisone Other (See Comments)    "Worsening headaches, GI issues, fatigue"   Nebivolol     Other reaction(s): Unknown   Olanzapine     Headache and unable to sleep for 3 nights. Per New Patient Packet.    Olmesartan     Other reaction(s): Unknown   Pregabalin     Confusion, Lack of concentration, dizziness, and likely drowsiness. Per New Patient Packet.     Prostaglandins     Other reaction(s): Other (See  Comments) Intolerance   Thyroid Hormones     Other reaction(s): Other (See Comments) Thyroid (Nature Thyroid) contraindicated with some of your other medications.   Zolpidem     Nightmares, Ineffective after 2 days. Per New Patient Packet.    Duloxetine Hcl     Other reaction(s): Rash   Fluoxetine Hcl     Other reaction(s): Rash   Nucynta [Tapentadol] Other (See Comments)    Vertigo    Phenytoin Anxiety    Hyperactivity, and Ineffective. Per New Patient Packet.     Family History  Problem Relation Age of Onset   Heart disease Father    Heart failure Father    Hypertension Father    Stroke Father    Stroke Mother    Dementia Mother    Kidney Stones Daughter    Anxiety disorder Daughter    OCD Daughter     Social History   Socioeconomic History   Marital status: Married    Spouse name: Not on file   Number of children: Not on file   Years of education: Not on file   Highest education level: Not on file  Occupational History   Not on file  Tobacco Use   Smoking status: Never   Smokeless tobacco: Never  Vaping Use   Vaping Use: Never used  Substance and Sexual Activity   Alcohol use: Yes    Comment: 1 Drink a Month, socially   Drug use: Not Currently   Sexual activity: Yes    Birth control/protection: None  Other Topics Concern  Not on file  Social History Narrative   Tobacco use, amount per day now: None   Past tobacco use, amount per day: None   How many years did you use tobacco: 0   Alcohol use (drinks per week): 0-1 Month   Diet:   Do you drink/eat things with caffeine: Occasionally ( Hot Chocolate and maybe 1/4 of 16oz Pepsi 2-3 times a week.   Marital status: Married                                  What year were you married? 1970   Do you live in a house, apartment, assisted living, condo, trailer, etc.? House   Is it one or more stories? One   How many persons live in your home? 2   Do you have pets in your home?( please list)  No   Highest  Level of education completed: Masters   Current or past profession: Intensive Transport planner for [employer]   Do you exercise? Yes                                    Type and how often? [place], Recumbent Bike, 3 times a week. Try to get a 1.2 mile walk at least 3 times a week or more.     Do you have a living will? Yes   Do you have a DNR form?  No                                 If not, do you want to discuss one?   Do you have signed POA/HPOA forms? Yes                       If so, please bring to you appointment    Do you have any difficulty bathing or dressing yourself? No    Do you have difficulty preparing food or eating? No   Do you have difficulty managing your medications? No   Do you have any difficulty managing your finances? No   Do you have any difficulty affording your medications? No         Social Determinants of Radio broadcast assistant Strain: Not on file  Food Insecurity: Not on file  Transportation Needs: Not on file  Physical Activity: Not on file  Stress: Not on file  Social Connections: Not on file  Intimate Partner Violence: Not on file     Constitutional: Pt reports fatigue. Denies fever, malaise, headache or abrupt weight changes.  HEENT: Pt reports nasal congestion. Denies eye pain, eye redness, ear pain, ringing in the ears, wax buildup, runny nose, bloody nose, or sore throat. Respiratory: Pt reports cough. Denies difficulty breathing, shortness of breath, or sputum production.   Cardiovascular: Denies chest pain, chest tightness, palpitations or swelling in the hands or feet.  Gastrointestinal: Denies abdominal pain, bloating, constipation, diarrhea or blood in the stool.  Neurological: Denies dizziness, difficulty with memory, difficulty with speech or problems with balance and coordination.    No other specific complaints in a complete review of systems (except as listed in HPI above).  Objective:   Physical Exam  BP  136/82 (BP Location: Left Arm, Patient Position: Sitting, Cuff  Size: Normal)   Pulse 68   Temp 97.7 F (36.5 C) (Temporal)   Wt 136 lb (61.7 kg)   SpO2 94%   BMI 20.08 kg/m   Wt Readings from Last 3 Encounters:  04/21/22 133 lb 6.1 oz (60.5 kg)  04/07/22 139 lb 12.8 oz (63.4 kg)  03/12/22 136 lb (61.7 kg)    General: Appears his stated age, well developed, well nourished in NAD. Skin: Warm, dry and intact.  HEENT: Head: normal shape and size; Eyes: sclera white, no icterus, conjunctiva pink, PERRLA and EOMs intact;  Neck:  Neck supple, trachea midline. No masses, lumps or thyromegaly present.  Cardiovascular: Normal rate and rhythm. S1,S2 noted.  No murmur, rubs or gallops noted.  Pulmonary/Chest: Normal effort and positive vesicular breath sounds with rhonchi noted in the right lower lobe.. No respiratory distress. No wheezes, rales or noted.  Musculoskeletal: No difficulty with gait.  Neurological: Alert and oriented.    BMET    Component Value Date/Time   NA 143 04/20/2022 0535   NA 146 (H) 12/26/2021 1524   K 3.5 04/20/2022 0535   CL 108 04/20/2022 0535   CO2 26 04/20/2022 0535   GLUCOSE 95 04/20/2022 0535   BUN 14 04/20/2022 0535   BUN 14 12/26/2021 1524   CREATININE 0.68 04/20/2022 0535   CALCIUM 9.0 04/20/2022 0535   GFRNONAA >60 04/20/2022 0535   GFRAA 101 05/20/2020 0000    Lipid Panel     Component Value Date/Time   CHOL 170 11/11/2021 1006   TRIG 99 11/11/2021 1006   HDL 48 11/11/2021 1006   CHOLHDL 3.5 11/11/2021 1006   LDLCALC 103 (H) 11/11/2021 1006    CBC    Component Value Date/Time   WBC 7.8 04/20/2022 0535   RBC 4.64 04/20/2022 0535   HGB 13.2 04/20/2022 0535   HGB 14.4 12/26/2021 1524   HCT 39.8 04/20/2022 0535   HCT 43.5 12/26/2021 1524   PLT 256 04/20/2022 0535   PLT 202 12/26/2021 1524   MCV 85.8 04/20/2022 0535   MCV 90 12/26/2021 1524   MCH 28.4 04/20/2022 0535   MCHC 33.2 04/20/2022 0535   RDW 12.5 04/20/2022 0535   RDW 13.3  12/26/2021 1524   LYMPHSABS 1.2 04/19/2022 0952   MONOABS 1.2 (H) 04/19/2022 0952   EOSABS 0.0 04/19/2022 0952   BASOSABS 0.0 04/19/2022 0952    Hgb A1C Lab Results  Component Value Date   HGBA1C 5.1 10/17/2019           Assessment & Plan:   Hospital follow-up for CAP:  Hospital notes, labs and imaging reviewed He will continue his antibiotics until his course is to finish He does not feel like he needs an albuterol inhaler at this time He will follow-up with pulmonology as scheduled   RTC in 1 month for follow-up of chronic conditions Webb Silversmith, NP

## 2022-04-23 ENCOUNTER — Encounter: Payer: Self-pay | Admitting: Internal Medicine

## 2022-04-23 LAB — QUANTIFERON-TB GOLD PLUS (RQFGPL)
QuantiFERON Mitogen Value: 3.46 IU/mL
QuantiFERON Nil Value: 0.16 IU/mL
QuantiFERON TB1 Ag Value: 0.12 IU/mL
QuantiFERON TB2 Ag Value: 0.16 IU/mL

## 2022-04-23 LAB — COMP PANEL: LEUKEMIA/LYMPHOMA

## 2022-04-23 LAB — QUANTIFERON-TB GOLD PLUS: QuantiFERON-TB Gold Plus: NEGATIVE

## 2022-04-24 LAB — CULTURE, BLOOD (ROUTINE X 2)
Culture: NO GROWTH
Culture: NO GROWTH
Special Requests: ADEQUATE
Special Requests: ADEQUATE

## 2022-04-27 ENCOUNTER — Encounter: Payer: Self-pay | Admitting: Internal Medicine

## 2022-05-04 ENCOUNTER — Other Ambulatory Visit: Payer: Self-pay | Admitting: Family Medicine

## 2022-05-04 DIAGNOSIS — J0110 Acute frontal sinusitis, unspecified: Secondary | ICD-10-CM

## 2022-05-04 MED ORDER — DIAZEPAM 5 MG PO TABS
ORAL_TABLET | ORAL | 0 refills | Status: DC
Start: 2022-05-04 — End: 2022-06-10

## 2022-05-05 NOTE — Telephone Encounter (Signed)
Discontinued by provider at discharge on 04/21/22, given x 10 day on 04/07/22, will refuse this request.  Requested Prescriptions  Pending Prescriptions Disp Refills  . doxycycline (VIBRA-TABS) 100 MG tablet [Pharmacy Med Name: DOXYCYCLINE HYCLATE 100 MG TAB] 20 tablet 0    Sig: TAKE 1 TABLET (100 MG TOTAL) BY MOUTH 2 (TWO) TIMES DAILY. FOR 10 DAYS. TAKE WITH FULL GLASS OF WATER, STAY UPRIGHT 30 MIN AFTER TAKING.     Off-Protocol Failed - 05/04/2022  2:20 AM      Failed - Medication not assigned to a protocol, review manually.      Passed - Valid encounter within last 12 months    Recent Outpatient Visits          1 week ago Community acquired pneumonia of right lower lobe of lung   [facility] [place], [name], NP   4 weeks ago Acute non-recurrent frontal sinusitis   [facility] [place], Devonne Doughty, DO   1 month ago Benign prostatic hyperplasia with urinary frequency   [facility] [place], [place] W, NP   4 months ago Non-ST elevation (NSTEMI) myocardial infarction [facility])   [facility], NP   5 months ago Excessive daytime sleepiness   [facility] [place], [name], NP      Future Appointments            In 2 weeks Garnette Gunner, [name], NP [facility], [place]   In 1 month Hilty, Nadean Corwin, MD [facility], [place]   In 3 months Ellyn Hack Leonie Green, MD [id number]

## 2022-05-12 ENCOUNTER — Ambulatory Visit: Payer: Medicare Other | Admitting: Internal Medicine

## 2022-05-14 ENCOUNTER — Encounter: Payer: Self-pay | Admitting: Pulmonary Disease

## 2022-05-14 ENCOUNTER — Ambulatory Visit (INDEPENDENT_AMBULATORY_CARE_PROVIDER_SITE_OTHER): Payer: Medicare Other | Admitting: Pulmonary Disease

## 2022-05-14 VITALS — BP 112/60 | HR 56 | Temp 97.7°F | Ht 69.0 in | Wt 139.2 lb

## 2022-05-14 DIAGNOSIS — I249 Acute ischemic heart disease, unspecified: Secondary | ICD-10-CM | POA: Diagnosis not present

## 2022-05-14 DIAGNOSIS — G4733 Obstructive sleep apnea (adult) (pediatric): Secondary | ICD-10-CM

## 2022-05-14 NOTE — Patient Instructions (Signed)
Call if you would like to get started on CPAP or if you need a referral to get an oral appliance for sleep apnea  Follow up in 6 months

## 2022-05-14 NOTE — Progress Notes (Signed)
Pulmonary, Critical Care, and Sleep Medicine  Chief Complaint  Patient presents with   New Patient (Initial Visit)    Past Surgical History:  He  has a past surgical history that includes Kidney stone surgery (09/14/1980); Kidney stone surgery (09/15/1995); Lithotripsy (09/14/2014); Lithotripsy (09/14/1996); Lithotripsy (09/14/1997); Gallbladder surgery (09/15/2015); Sigmoidoscopy (09/14/2017); Colonoscopy (09/15/2015); Cholecystectomy (2017); Shoulder surgery (Right, 1984); Back surgery; Spinal cord stimulator implant; Pain pump implantation; Pain pump removal; Tonsillectomy; Foot surgery (Bilateral, 03/18/2020); Foot surgery (08/032021); Upper gi endoscopy; Cardiac catheterization; Colonoscopy with propofol (N/A, 10/03/2020); Esophagogastroduodenoscopy (egd) with propofol (N/A, 10/03/2020); Cataract extraction w/PHACO (Left, 01/14/2021); Cataract extraction w/PHACO (Right, 01/28/2021); Coronary/Graft Acute MI Revascularization (N/A, 11/27/2021); and LEFT HEART CATH AND CORONARY ANGIOGRAPHY (N/A, 11/27/2021).  Past Medical History:  Anxiety, Back pain s/p spinal cord stimulator, CHF, Depression, GERD, Headache, Nephrolithiasis, Neuropathy, HTN, Lyme disease, Overactive bladder, CAD  Constitutional:  BP 112/60 (BP Location: Left Arm, Patient Position: Sitting, Cuff Size: Normal)   Pulse (!) 56   Temp 97.7 F (36.5 C) (Oral)   Ht '5\' 9"'$  (1.753 m)   Wt 139 lb 3.2 oz (63.1 kg)   SpO2 97%   BMI 20.56 kg/m   Brief Summary:  Grant Young is a 76 y.o. male with obstructive sleep apnea.      Subjective:   He is here with his wife.  He was in hospital earlier this month with pneumonia.  He had a heart attack in March.  His sleep study from May shows mild sleep apnea.  He isn't sure how much trouble he has with his breathing while asleep.  He is having trouble with migraine and tension headaches, and peripheral neuropathy/restless leg syndrome.  Physical Exam:   Appearance - well  kempt   ENMT - no sinus tenderness, no oral exudate, no LAN, Mallampati 3 airway, no stridor  Respiratory - equal breath sounds bilaterally, no wheezing or rales  CV - s1s2 regular rate and rhythm, no murmurs  Ext - no clubbing, no edema  Skin - no rashes  Psych - normal mood and affect   Chest Imaging:  CT angio chest 04/19/22 >> 4.7 x 3.3 x 4.3 mass posterior segment RLL, associated ill defined nodules  Sleep Tests:  PSG 02/05/22 >> AHI 6.1, SpO2 low 90%  Cardiac Tests:  Echo 11/28/21 >> EF 50 to 55%, mild MR, mild AR  Social History:  He  reports that he has never smoked. He has never used smokeless tobacco. He reports current alcohol use. He reports that he does not currently use drugs.  Family History:  His family history includes Anxiety disorder in his daughter; Dementia in his mother; Heart disease in his father; Heart failure in his father; Hypertension in his father; Kidney Stones in his daughter; OCD in his daughter; Stroke in his father and mother.     Assessment/Plan:   Obstructive sleep apnea. - reviewed his sleep study - had very lengthy discussion about how sleep apnea can impact his cardiovascular health, difficulty with headaches from sleep disruption, and heightened sensitivity to pain from neuropathy and restless leg syndrome due to sleep disruption.  Explained that treatment for sleep apnea for secondary prevention of cardiovascular disease takes on special importance regardless of his age or severity of sleep apnea - had an extensive discussion about treatment options for sleep apnea including CPAP, oral appliance, positional therapy and surgical procedures - he is unable to make a decision about whether he would want to try additional therapies for sleep  apnea at this time - he will check with his dentist if he is a candidate for an oral appliance  Peripheral neuropathy, Restless leg syndrome, Migraine headaches. - he is followed by Dr. Gurney Maxin with  neurology at [facility]  Coronary artery disease. - followed by Dr. Kate Sable with cardiology  Time Spent Involved in Patient Care on Day of Examination:  58 minutes  Follow up:   Patient Instructions  Call if you would like to get started on CPAP or if you need a referral to get an oral appliance for sleep apnea  Follow up in 6 months  Medication List:   Allergies as of 05/14/2022       Reactions   Ace Inhibitors    Other reaction(s): Cough   Fluoxetine Anxiety   "made me fall asleep" per pt "bad headaches and "makes  Me  Crazy" historical allergy noted in [employer] "made me fall asleep" per pt "bad headaches and "makes  Me  Crazy" Per New Patient Packet.    Metoclopramide    Other reaction(s): Other (See Comments), Other (See Comments), Unknown Tardive Dyskinesia  historical allergy noted in [employer] Tardive Dyskinesia  Per New Patient Packet.   Nalbuphine    Used Post Back surgery- Anesthesiologist Error. Patient had Narcotic Withdraw. Per New Patient Packet.    Other    Other reaction(s): Other (See Comments) Altered mental status in combo with narcotics at previous hospitalization - Full Withdrawal Symptoms Other reaction(s): Rash   Amoxicillin-pot Clavulanate Nausea Only   Per New Patient Packet.   Doxazosin Rash   Other reaction(s): Other - See Comments, Rash UNKNOWN REACTION UNKNOWN REACTION   Duloxetine Nausea Only   Per New Patient Packet.    Penicillins Nausea Only   Per New Patient Packet.   Tamsulosin Itching, Anxiety   Restless, Flushing, Heavy Chest, Itching, Hyperactive mood and Anxiety. Unable to handle side effects. Per New Patient Packet.    Trazodone And Nefazodone Itching, Anxiety, Rash   Headache. "INCREASED MY ANXIETY AND HEARTRATE" Flushing, tachycardia "INCREASED MY ANXIETY AND HEARTRATE" Per New Patient Packet.   Amlodipine    Shaking, unsure of reaction. Per New Patient Packet.    Cinoxacin    GI Intolerance, and  Dizziness. Per New Patient Packet.    Ciprofloxacin    Other reaction(s): Unknown   Fludrocortisone Other (See Comments)   "Worsening headaches, GI issues, fatigue"   Nebivolol    Other reaction(s): Unknown   Olanzapine    Headache and unable to sleep for 3 nights. Per New Patient Packet.   Olmesartan    Other reaction(s): Unknown   Pregabalin    Confusion, Lack of concentration, dizziness, and likely drowsiness. Per New Patient Packet.    Prostaglandins    Other reaction(s): Other (See Comments) Intolerance   Thyroid Hormones    Other reaction(s): Other (See Comments) Thyroid (Nature Thyroid) contraindicated with some of your other medications.   Zolpidem    Nightmares, Ineffective after 2 days. Per New Patient Packet.    Duloxetine Hcl    Other reaction(s): Rash   Fluoxetine Hcl    Other reaction(s): Rash   Nucynta [tapentadol] Other (See Comments)   Vertigo    Phenytoin Anxiety   Hyperactivity, and Ineffective. Per New Patient Packet.         Medication List        Accurate as of May 14, 2022  1:09 PM. If you have any questions, ask your nurse or doctor.  STOP taking these medications    azithromycin 250 MG tablet Commonly known as: Zithromax Stopped by: Chesley Mires, MD   DAYQUIL/NYQUIL COLD/FLU RELIEF PO Stopped by: Chesley Mires, MD   ondansetron 4 MG tablet Commonly known as: ZOFRAN Stopped by: Chesley Mires, MD   prochlorperazine 10 MG tablet Commonly known as: COMPAZINE Stopped by: Chesley Mires, MD       TAKE these medications    anastrozole 1 MG tablet Commonly known as: ARIMIDEX 1/2 tablet (0.5 mg) by mouth once weekly   aspirin 81 MG chewable tablet Chew 1 tablet (81 mg total) by mouth daily.   butalbital-acetaminophen-caffeine 50-325-40 MG tablet Commonly known as: FIORICET Take 1 tablet by mouth every 6 (six) hours as needed for headache.   cetirizine 10 MG tablet Commonly known as: ZYRTEC Take 10 mg by mouth daily as  needed for allergies.   Cholecalciferol 50 MCG (2000 UT) Caps Take 1 tablet by mouth daily.   clopidogrel 75 MG tablet Commonly known as: PLAVIX Take 1 tablet (75 mg) by mouth once daily   DHEA 25 MG Caps Take 25 mg by mouth daily.   diazepam 5 MG tablet Commonly known as: VALIUM TAKE 1 TABLET IN THE MORNING AND 2 TABLETS AT BEDTIME   dicyclomine 20 MG tablet Commonly known as: BENTYL Take 20 mg by mouth as needed.   DIGESTION GB PO Take 1 capsule by mouth daily.   escitalopram 20 MG tablet Commonly known as: LEXAPRO TAKE 1 TABLET BY MOUTH EVERY DAY   ezetimibe 10 MG tablet Commonly known as: ZETIA Take 10 mg by mouth daily.   Gemtesa 75 MG Tabs Generic drug: Vibegron Take by mouth.   guaiFENesin 200 MG tablet Take 200 mg by mouth every 4 (four) hours as needed for cough or to loosen phlegm.   HYDROcodone-acetaminophen 10-325 MG tablet Commonly known as: NORCO Take 1 tablet by mouth 3 (three) times daily as needed.   IMODIUM PO Take by mouth as needed.   ipratropium 0.06 % nasal spray Commonly known as: ATROVENT Place 1 spray into both nostrils 4 (four) times daily as needed for rhinitis. For up to 5-7 days then stop.   MAGNESIUM BISGLYCINATE PO Take by mouth daily.   melatonin 5 MG Tabs Take 5 mg by mouth at bedtime.   NONFORMULARY OR COMPOUNDED ITEM Super Bi-Mix Injection  Papaverine HCI '30mg'$ /mL Phentolamine Mesylate '1mg'$ /mL Inject 0.105m - 141mIncrease as needed to achieve erection   NUTRITIONAL SUPPLEMENT PO Take 1 capsule by mouth daily. [name]   NUTRITIONAL SUPPLEMENT PO Take by mouth daily. InflammaSaver   polyethylene glycol 17 g packet Commonly known as: MIRALAX / GLYCOLAX Take 17 g by mouth daily as needed.   PROBIOTIC DAILY PO Take by mouth daily.   propranolol 40 MG tablet Commonly known as: INDERAL Take 1 tablet (40 mg total) by mouth 2 (two) times daily.   pyridostigmine 60 MG tablet Commonly known as:  MESTINON TAKE 1 TABLET BY MOUTH TWICE A DAY   Qulipta 30 MG Tabs Generic drug: Atogepant Take 1 tablet by mouth daily. What changed: Another medication with the same name was removed. Continue taking this medication, and follow the directions you see here. Changed by: ViChesley MiresMD   RABEprazole 20 MG tablet Commonly known as: ACIPHEX TAKE 1 TABLET BY MOUTH TWICE A DAY   sucralfate 1 g tablet Commonly known as: CARAFATE Take 1 g by mouth 4 (four) times daily as needed.   testosterone  cypionate 200 MG/ML injection Commonly known as: DEPOTESTOSTERONE CYPIONATE Inject 80 mg into the muscle every 14 (fourteen) days.   valACYclovir 1000 MG tablet Commonly known as: VALTREX Take 2 tabs p.o. and repeat in 12 hours as needed for cold sore   Vitamin B-12 5000 MCG Lozg Take 1 lozenge by mouth daily.        Signature:  Chesley Mires, MD [place] Pager - [phone] 05/14/2022, 1:09 PM

## 2022-05-17 ENCOUNTER — Other Ambulatory Visit: Payer: Self-pay | Admitting: Physician Assistant

## 2022-05-19 ENCOUNTER — Ambulatory Visit: Payer: Medicare Other | Admitting: Internal Medicine

## 2022-05-19 ENCOUNTER — Other Ambulatory Visit: Payer: Self-pay | Admitting: Pulmonary Disease

## 2022-05-19 DIAGNOSIS — R918 Other nonspecific abnormal finding of lung field: Secondary | ICD-10-CM

## 2022-05-20 ENCOUNTER — Encounter: Payer: Self-pay | Admitting: Urology

## 2022-05-20 ENCOUNTER — Ambulatory Visit (INDEPENDENT_AMBULATORY_CARE_PROVIDER_SITE_OTHER): Payer: Medicare Other | Admitting: Urology

## 2022-05-20 VITALS — BP 174/93 | HR 56 | Ht 69.0 in | Wt 139.0 lb

## 2022-05-20 DIAGNOSIS — N3281 Overactive bladder: Secondary | ICD-10-CM | POA: Diagnosis not present

## 2022-05-20 DIAGNOSIS — N529 Male erectile dysfunction, unspecified: Secondary | ICD-10-CM | POA: Diagnosis not present

## 2022-05-20 MED ORDER — GEMTESA 75 MG PO TABS: 75.0000 mg | ORAL_TABLET | Freq: Every day | ORAL | 3 refills | Status: DC

## 2022-05-20 MED ORDER — NONFORMULARY OR COMPOUNDED ITEM: 6 refills | Status: DC

## 2022-05-20 NOTE — Progress Notes (Signed)
   05/20/2022 10:54 AM   [name] Sep 12, 1946 [id number]  Reason for visit: Follow up ED, OAB  HPI: I saw Mr. Bodey back for discussion of ED treatment and OAB.  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  He also has OAB currently managed with Gemtesa with good results.  In February 2022 he underwent a cystoscopy and TRUS for worsening urinary frequency which showed a wide open prostatic fossa with no evidence of obstruction, and calculated prostate volume of 15 g.  He was changed to super Bi-Mix last year but has not noticed any significant change on that medication, and is interested in going back to the original Bi-Mix dose.  He has an allergy to alprostadil, so he cannot use Trimix.  He currently is using the Bimix in addition to a penile ring to achieve a sufficient erection.  He is on testosterone replacement and management through an outside functional medicine MD in [place].  He is on a number of medications that negatively impact erections including propanolol, Norco, and Ativan.  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  I reviewed those images and the recent hospital notes.  Bi-Mix and Gemtesa refilled RTC 1 year   Billey Co, MD  [facility] [address] [place], [place] [address] [phone]

## 2022-05-22 ENCOUNTER — Ambulatory Visit (INDEPENDENT_AMBULATORY_CARE_PROVIDER_SITE_OTHER): Payer: Medicare Other | Admitting: Internal Medicine

## 2022-05-22 ENCOUNTER — Encounter: Payer: Self-pay | Admitting: Internal Medicine

## 2022-05-22 VITALS — BP 165/85 | HR 51 | Ht 69.0 in | Wt 137.8 lb

## 2022-05-22 DIAGNOSIS — E349 Endocrine disorder, unspecified: Secondary | ICD-10-CM

## 2022-05-22 DIAGNOSIS — F419 Anxiety disorder, unspecified: Secondary | ICD-10-CM

## 2022-05-22 DIAGNOSIS — R251 Tremor, unspecified: Secondary | ICD-10-CM

## 2022-05-22 DIAGNOSIS — N3281 Overactive bladder: Secondary | ICD-10-CM

## 2022-05-22 DIAGNOSIS — K219 Gastro-esophageal reflux disease without esophagitis: Secondary | ICD-10-CM

## 2022-05-22 DIAGNOSIS — I25110 Atherosclerotic heart disease of native coronary artery with unstable angina pectoris: Secondary | ICD-10-CM | POA: Diagnosis not present

## 2022-05-22 DIAGNOSIS — F32A Depression, unspecified: Secondary | ICD-10-CM

## 2022-05-22 DIAGNOSIS — A6920 Lyme disease, unspecified: Secondary | ICD-10-CM

## 2022-05-22 DIAGNOSIS — G43719 Chronic migraine without aura, intractable, without status migrainosus: Secondary | ICD-10-CM | POA: Diagnosis not present

## 2022-05-22 DIAGNOSIS — K580 Irritable bowel syndrome with diarrhea: Secondary | ICD-10-CM

## 2022-05-22 DIAGNOSIS — M47816 Spondylosis without myelopathy or radiculopathy, lumbar region: Secondary | ICD-10-CM

## 2022-05-22 DIAGNOSIS — I10 Essential (primary) hypertension: Secondary | ICD-10-CM | POA: Diagnosis not present

## 2022-05-22 DIAGNOSIS — B001 Herpesviral vesicular dermatitis: Secondary | ICD-10-CM

## 2022-05-22 DIAGNOSIS — G2581 Restless legs syndrome: Secondary | ICD-10-CM

## 2022-05-22 DIAGNOSIS — E785 Hyperlipidemia, unspecified: Secondary | ICD-10-CM

## 2022-05-22 DIAGNOSIS — G894 Chronic pain syndrome: Secondary | ICD-10-CM

## 2022-05-22 DIAGNOSIS — I951 Orthostatic hypotension: Secondary | ICD-10-CM

## 2022-05-22 DIAGNOSIS — I249 Acute ischemic heart disease, unspecified: Secondary | ICD-10-CM

## 2022-05-22 MED ORDER — PYRIDOSTIGMINE BROMIDE 60 MG PO TABS
30.0000 mg | ORAL_TABLET | Freq: Two times a day (BID) | ORAL | 0 refills | Status: DC
Start: 2022-05-22 — End: 2023-04-07

## 2022-05-22 NOTE — Assessment & Plan Note (Signed)
No angina Continue ezetimibe, aspirin and Plavix Encouraged him to consume a low-fat diet

## 2022-05-22 NOTE — Assessment & Plan Note (Signed)
Plan per neurology is to wean off diazepam and escitalopram, switch to nortriptyline and venlafaxine Support offered

## 2022-05-22 NOTE — Assessment & Plan Note (Signed)
He will continue to follow with his integrative health doctor

## 2022-05-22 NOTE — Assessment & Plan Note (Signed)
Try to identify foods that trigger reflux and avoid them Continue Rabeprazole

## 2022-05-22 NOTE — Assessment & Plan Note (Signed)
Continue Imodium as needed 

## 2022-05-22 NOTE — Assessment & Plan Note (Signed)
Continue valacyclovir as needed 

## 2022-05-22 NOTE — Assessment & Plan Note (Signed)
Managed with testosterone injections and Arimidex as prescribed by urology

## 2022-05-22 NOTE — Assessment & Plan Note (Signed)
Continue ezetimibe Encouraged him to consume a low-fat diet

## 2022-05-22 NOTE — Assessment & Plan Note (Signed)
Elevated today We will decrease Mestinon to 30 mg twice daily Reinforced DASH diet

## 2022-05-22 NOTE — Patient Instructions (Signed)
Restless Legs Syndrome Restless legs syndrome is a condition that causes uncomfortable feelings or sensations in the legs, especially while sitting or lying down. The sensations usually cause an overwhelming urge to move the legs. The arms can also sometimes be affected. The condition can range from mild to severe. The symptoms often interfere with a person's ability to sleep. What are the causes? The cause of this condition is not known. What increases the risk? The following factors may make you more likely to develop this condition: Being older than 50. Pregnancy. Being a woman. In general, the condition is more common in women than in men. A family history of the condition. Having iron deficiency. Overuse of caffeine, nicotine, or alcohol. Certain medical conditions, such as kidney disease, Parkinson's disease, or nerve damage. Certain medicines, such as those for high blood pressure, nausea, colds, allergies, depression, and some heart conditions. What are the signs or symptoms? The main symptom of this condition is uncomfortable sensations in the legs, such as: Pulling. Tingling. Prickling. Throbbing. Crawling. Burning. Usually, the sensations: Affect both sides of the body. Are worse when you sit or lie down. Are worse at night. These may make it difficult to fall asleep. Make you have a strong urge to move your legs. Are temporarily relieved by moving your legs or standing. The arms can also be affected, but this is rare. People who have this condition often have tiredness during the day because of their lack of sleep at night. How is this diagnosed? This condition may be diagnosed based on: Your symptoms. Blood tests. In some cases, you may be monitored in a sleep lab by a specialist (a sleep study). This can detect any disruptions in your sleep. How is this treated? This condition is treated by managing the symptoms. This may include: Lifestyle changes, such as  exercising, using relaxation techniques, and avoiding caffeine, alcohol, or tobacco. Iron supplements. Medicines. Parkinson's medications may be tried first. Anti-seizure medications can also be helpful. Follow these instructions at home: General instructions Take over-the-counter and prescription medicines only as told by your health care provider. Use methods to help relieve the uncomfortable sensations, such as: Massaging your legs. Walking or stretching. Taking a cold or hot bath. Keep all follow-up visits. This is important. Lifestyle     Practice good sleep habits. For example, go to bed and get up at the same time every day. Most adults should get 7-9 hours of sleep each night. Exercise regularly. Try to get at least 30 minutes of exercise most days of the week. Practice ways of relaxing, such as yoga or meditation. Avoid caffeine and alcohol. Do not use any products that contain nicotine or tobacco. These products include cigarettes, chewing tobacco, and vaping devices, such as e-cigarettes. If you need help quitting, ask your health care provider. Where to find more information National Institute of Neurological Disorders and Stroke: www.ninds.nih.gov Contact a health care provider if: Your symptoms get worse or they do not improve with treatment. Summary Restless legs syndrome is a condition that causes uncomfortable feelings or sensations in the legs, especially while sitting or lying down. The symptoms often interfere with your ability to sleep. This condition is treated by managing the symptoms. You may need to make lifestyle changes or take medicines. This information is not intended to replace advice given to you by your health care provider. Make sure you discuss any questions you have with your health care provider. Document Revised: 04/13/2021 Document Reviewed: 04/13/2021 Elsevier Patient Education    2023 Elsevier Inc.  

## 2022-05-22 NOTE — Assessment & Plan Note (Signed)
Managed with propanolol

## 2022-05-22 NOTE — Assessment & Plan Note (Signed)
Managed with hydrocodone and spinal cord stimulator He will continue to follow with pain management

## 2022-05-22 NOTE — Progress Notes (Signed)
Subjective:    Patient ID: Grant Young, male    DOB: 10/28/1945, 76 y.o.   MRN: [id number]  HPI  Patient presents to clinic today for follow-up of chronic conditions.  Anxiety and Depression: Chronic, managed on Escitalopram and Diazepam.  He is not currently seeing a therapist.  He denies SI/HI.  Migraines: These occur daily.  He is not able to identify his triggers.  He is taking Qulipta, Propanolol and Fioricet as prescribed.  He was recently referred for a second opinion with neurology.  He reports the plan is to wean off the Diazepam and Escitalopram and switch to Nortriptyline.  HTN: His BP today is 177/68.  He is not taking any antihypertensive medications secondary to orthostasis.  This is treated with Mestinon.  ECG from 8 12/2021 reviewed.  HLD with CAD status post MI: His last LDL was 103, triglycerides 99.  He is taking Ezetimibe, Aspirin and Plavix as prescribed.  He tries to consume a low-fat diet.  Chronic Back Pain/Neuropathy/RLS: Status post multiple back surgeries and spinal cord stimulator placement.  He is taking Hydrocodone as prescribed by pain management.  History of Cold Sores: He denies recent outbreak.  He takes valacyclovir as needed only.  GERD: He denies breakthrough on Rabeprazole.  Upper GI from 09/2020 reviewed.  Hypotestosteronism: Managed with Testosterone injections every 2 weeks and Arimidex.  He follows with urology.  IBS: Mainly diarrhea.  He manages this with Imodium as needed.  He follows with GI.  History of Lyme disease: He follows with integrative medicine for this.  He is not currently taking any medications for this.  OAB: Mainly urinary frequency.  He is taking Gemtasa as prescribed.  He follows with urology.  Review of Systems     Past Medical History:  Diagnosis Date   Anxiety    Per New Patient Packet   Chronic back pain    Per New Patient Packet   Chronic heart disease    Per New Patient Packet   Depression    GERD  (gastroesophageal reflux disease)    Headache    History of gastritis    Per New Patient Packet   History of neuropathy    Per New Patient Packet   Hypertension    Per New Patient Packet   Insomnia    Kidney stone    Lumbar radicular pain    Per New Patient Packet   Lyme disease    Per New Patient Packet   Myocardial infarction [facility])    Overactive bladder    PONV (postoperative nausea and vomiting)    Skin cancer, basal cell    Spinal cord stimulator status    01/08/21 - not currently using.   Squamous cell skin cancer    Substance abuse (HCC)     Current Outpatient Medications  Medication Sig Dispense Refill   anastrozole (ARIMIDEX) 1 MG tablet 1/2 tablet (0.5 mg) by mouth once weekly     aspirin 81 MG chewable tablet Chew 1 tablet (81 mg total) by mouth daily. 90 tablet 3   butalbital-acetaminophen-caffeine (FIORICET) 50-325-40 MG tablet Take 1 tablet by mouth every 6 (six) hours as needed for headache. 90 tablet 0   cetirizine (ZYRTEC) 10 MG tablet Take 10 mg by mouth daily as needed for allergies.     Cholecalciferol 50 MCG (2000 UT) CAPS Take 1 tablet by mouth daily.     clopidogrel (PLAVIX) 75 MG tablet Take 1 tablet (75 mg) by mouth once daily  90 tablet 2   Cyanocobalamin (VITAMIN B-12) 5000 MCG LOZG Take 1 lozenge by mouth daily.      DHEA 25 MG CAPS Take 25 mg by mouth daily.     diazepam (VALIUM) 5 MG tablet TAKE 1 TABLET IN THE MORNING AND 2 TABLETS AT BEDTIME 90 tablet 0   Digestive Aids Mixture (DIGESTION GB PO) Take 1 capsule by mouth daily.     escitalopram (LEXAPRO) 20 MG tablet TAKE 1 TABLET BY MOUTH EVERY DAY 90 tablet 0   ezetimibe (ZETIA) 10 MG tablet Take 10 mg by mouth daily.     HYDROcodone-acetaminophen (NORCO) 10-325 MG tablet Take 1 tablet by mouth 3 (three) times daily as needed. 90 tablet 0   ipratropium (ATROVENT) 0.06 % nasal spray Place 1 spray into both nostrils 4 (four) times daily as needed for rhinitis. For up to 5-7 days then stop. 15 mL 0    Loperamide HCl (IMODIUM PO) Take by mouth as needed.      MAGNESIUM BISGLYCINATE PO Take by mouth daily.     melatonin 5 MG TABS Take 5 mg by mouth at bedtime.     NONFORMULARY OR COMPOUNDED ITEM Bi-Mix Papaverine '30mg'$ , Phentolamine '1mg'$    Dosage: Inject 1cc per injection    Vial 41m   Qty #5 Refills 6 5 each 6   NONFORMULARY OR COMPOUNDED ITEM Bi-Mix Papaverine '30mg'$ , Phentolamine '1mg'$    Dosage: Inject 1cc per injection    Vial 170m  Qty #5 Refills 6 5 each 6   Nutritional Supplements (NUTRITIONAL SUPPLEMENT PO) Take 1 capsule by mouth daily. [name]     Nutritional Supplements (NUTRITIONAL SUPPLEMENT PO) Take by mouth daily. InflammaSaver     ondansetron (ZOFRAN) 4 MG tablet Take by mouth.     polyethylene glycol (MIRALAX / GLYCOLAX) 17 g packet Take 17 g by mouth daily as needed.     Probiotic Product (PROBIOTIC DAILY PO) Take by mouth daily.     propranolol (INDERAL) 40 MG tablet Take 1 tablet (40 mg total) by mouth 2 (two) times daily. 180 tablet 1   pyridostigmine (MESTINON) 60 MG tablet TAKE 1 TABLET BY MOUTH TWICE A DAY 180 tablet 0   QULIPTA 30 MG TABS Take 1 tablet by mouth daily.     RABEprazole (ACIPHEX) 20 MG tablet TAKE 1 TABLET BY MOUTH TWICE A DAY 180 tablet 0   sucralfate (CARAFATE) 1 g tablet Take 1 g by mouth 4 (four) times daily as needed.      testosterone cypionate (DEPOTESTOSTERONE CYPIONATE) 200 MG/ML injection Inject 80 mg into the muscle every 14 (fourteen) days.     valACYclovir (VALTREX) 1000 MG tablet Take 2 tabs p.o. and repeat in 12 hours as needed for cold sore 30 tablet 0   Vibegron (GEMTESA) 75 MG TABS Take 75 mg by mouth daily. 90 tablet 3   No current facility-administered medications for this visit.    Allergies  Allergen Reactions   Ace Inhibitors     Other reaction(s): Cough   Fluoxetine Anxiety    "made me fall asleep" per pt "bad headaches and "makes  Me  Crazy" historical allergy noted in [employer] "made me fall  asleep" per pt "bad headaches and "makes  Me  Crazy" Per New Patient Packet.     Metoclopramide     Other reaction(s): Other (See Comments), Other (See Comments), Unknown Tardive Dyskinesia  historical allergy noted in [employer] Tardive Dyskinesia  Per New Patient Packet.  Nalbuphine     Used Post Back surgery- Anesthesiologist Error. Patient had Narcotic Withdraw. Per New Patient Packet.    Other     Other reaction(s): Other (See Comments) Altered mental status in combo with narcotics at previous hospitalization - Full Withdrawal Symptoms Other reaction(s): Rash   Amoxicillin-Pot Clavulanate Nausea Only    Per New Patient Packet.   Doxazosin Rash    Other reaction(s): Other - See Comments, Rash UNKNOWN REACTION UNKNOWN REACTION    Duloxetine Nausea Only    Per New Patient Packet.    Penicillins Nausea Only    Per New Patient Packet.   Tamsulosin Itching and Anxiety    Restless, Flushing, Heavy Chest, Itching, Hyperactive mood and Anxiety. Unable to handle side effects. Per New Patient Packet.     Trazodone And Nefazodone Itching, Anxiety and Rash    Headache. "INCREASED MY ANXIETY AND HEARTRATE" Flushing, tachycardia "INCREASED MY ANXIETY AND HEARTRATE" Per New Patient Packet.    Amlodipine     Shaking, unsure of reaction. Per New Patient Packet.     Cinoxacin     GI Intolerance, and Dizziness. Per New Patient Packet.    Ciprofloxacin     Other reaction(s): Unknown   Fludrocortisone Other (See Comments)    "Worsening headaches, GI issues, fatigue"   Nebivolol     Other reaction(s): Unknown   Olanzapine     Headache and unable to sleep for 3 nights. Per New Patient Packet.    Olmesartan     Other reaction(s): Unknown   Pregabalin     Confusion, Lack of concentration, dizziness, and likely drowsiness. Per New Patient Packet.     Prostaglandins     Other reaction(s): Other (See Comments) Intolerance   Thyroid Hormones     Other reaction(s): Other (See  Comments) Thyroid (Nature Thyroid) contraindicated with some of your other medications.   Zolpidem     Nightmares, Ineffective after 2 days. Per New Patient Packet.    Duloxetine Hcl     Other reaction(s): Rash   Fluoxetine Hcl     Other reaction(s): Rash   Nucynta [Tapentadol] Other (See Comments)    Vertigo    Phenytoin Anxiety    Hyperactivity, and Ineffective. Per New Patient Packet.     Family History  Problem Relation Age of Onset   Heart disease Father    Heart failure Father    Hypertension Father    Stroke Father    Stroke Mother    Dementia Mother    Kidney Stones Daughter    Anxiety disorder Daughter    OCD Daughter     Social History   Socioeconomic History   Marital status: Married    Spouse name: Not on file   Number of children: Not on file   Years of education: Not on file   Highest education level: Not on file  Occupational History   Not on file  Tobacco Use   Smoking status: Never    Passive exposure: Never   Smokeless tobacco: Never  Vaping Use   Vaping Use: Never used  Substance and Sexual Activity   Alcohol use: Yes    Comment: 1 Drink a Month, socially   Drug use: Not Currently   Sexual activity: Yes    Birth control/protection: None  Other Topics Concern   Not on file  Social History Narrative   Tobacco use, amount per day now: None   Past tobacco use, amount per day: None   How many years  did you use tobacco: 0   Alcohol use (drinks per week): 0-1 Month   Diet:   Do you drink/eat things with caffeine: Occasionally ( Hot Chocolate and maybe 1/4 of 16oz Pepsi 2-3 times a week.   Marital status: Married                                  What year were you married? 1970   Do you live in a house, apartment, assisted living, condo, trailer, etc.? House   Is it one or more stories? One   How many persons live in your home? 2   Do you have pets in your home?( please list)  No   Highest Level of education completed: Masters   Current or  past profession: Intensive Transport planner for [employer]   Do you exercise? Yes                                    Type and how often? [place], Recumbent Bike, 3 times a week. Try to get a 1.2 mile walk at least 3 times a week or more.     Do you have a living will? Yes   Do you have a DNR form?  No                                 If not, do you want to discuss one?   Do you have signed POA/HPOA forms? Yes                       If so, please bring to you appointment    Do you have any difficulty bathing or dressing yourself? No    Do you have difficulty preparing food or eating? No   Do you have difficulty managing your medications? No   Do you have any difficulty managing your finances? No   Do you have any difficulty affording your medications? No         Social Determinants of Radio broadcast assistant Strain: Not on file  Food Insecurity: Not on file  Transportation Needs: Not on file  Physical Activity: Not on file  Stress: Not on file  Social Connections: Not on file  Intimate Partner Violence: Not on file     Constitutional: Patient reports frequent headaches.  Denies fever, malaise, fatigue, or abrupt weight changes.  HEENT: Denies eye pain, eye redness, ear pain, ringing in the ears, wax buildup, runny nose, nasal congestion, bloody nose, or sore throat. Respiratory: Denies difficulty breathing, shortness of breath, cough or sputum production.   Cardiovascular: Denies chest pain, chest tightness, palpitations or swelling in the hands or feet.  Gastrointestinal: Patient reports intermittent diarrhea.  Denies abdominal pain, bloating, constipation, or blood in the stool.  GU: Patient reports urinary frequency.  Denies urgency, pain with urination, burning sensation, blood in urine, odor or discharge. Musculoskeletal: Patient is chronic back pain.  Denies decrease in range of motion, difficulty with gait, or joint swelling.  Skin: Denies redness, rashes,  lesions or ulcercations.  Neurological: Patient reports neuropathy, restless legs..  Denies dizziness, difficulty with memory, difficulty with speech or problems with balance and coordination.  Psych: Patient has a history of anxiety and depression.  Denies SI/HI.  No other specific complaints in a complete review of systems (except as listed in HPI above).  Objective:   Physical Exam  BP (!) 165/85   Pulse (!) 51   Ht '5\' 9"'$  (1.753 m)   Wt 137 lb 12.8 oz (62.5 kg)   SpO2 100%   BMI 20.35 kg/m   Wt Readings from Last 3 Encounters:  05/20/22 139 lb (63 kg)  05/14/22 139 lb 3.2 oz (63.1 kg)  04/22/22 136 lb (61.7 kg)    General: Appears his stated age, well developed, well nourished in NAD. Skin: Warm, dry and intact. HEENT: Head: normal shape and size; Eyes: sclera white, no icterus, conjunctiva pink, PERRLA and EOMs intact; Cardiovascular: Bradycardic with normal rhythm. S1,S2 noted.  No murmur, rubs or gallops noted. No JVD or BLE edema. No carotid bruits noted. Pulmonary/Chest: Normal effort and positive vesicular breath sounds. No respiratory distress. No wheezes, rales or ronchi noted.  Abdomen: Normal bowel sounds. Musculoskeletal: Resting tremor noted of lower extremities.  No difficulty with gait.  Neurological: Alert and oriented. Coordination normal.  Psychiatric: Mood and affect normal. Behavior is normal. Judgment and thought content normal.    BMET    Component Value Date/Time   NA 143 04/20/2022 0535   NA 146 (H) 12/26/2021 1524   K 3.5 04/20/2022 0535   CL 108 04/20/2022 0535   CO2 26 04/20/2022 0535   GLUCOSE 95 04/20/2022 0535   BUN 14 04/20/2022 0535   BUN 14 12/26/2021 1524   CREATININE 0.68 04/20/2022 0535   CALCIUM 9.0 04/20/2022 0535   GFRNONAA >60 04/20/2022 0535   GFRAA 101 05/20/2020 0000    Lipid Panel     Component Value Date/Time   CHOL 170 11/11/2021 1006   TRIG 99 11/11/2021 1006   HDL 48 11/11/2021 1006   CHOLHDL 3.5 11/11/2021 1006    LDLCALC 103 (H) 11/11/2021 1006    CBC    Component Value Date/Time   WBC 7.8 04/20/2022 0535   RBC 4.64 04/20/2022 0535   HGB 13.2 04/20/2022 0535   HGB 14.4 12/26/2021 1524   HCT 39.8 04/20/2022 0535   HCT 43.5 12/26/2021 1524   PLT 256 04/20/2022 0535   PLT 202 12/26/2021 1524   MCV 85.8 04/20/2022 0535   MCV 90 12/26/2021 1524   MCH 28.4 04/20/2022 0535   MCHC 33.2 04/20/2022 0535   RDW 12.5 04/20/2022 0535   RDW 13.3 12/26/2021 1524   LYMPHSABS 1.2 04/19/2022 0952   MONOABS 1.2 (H) 04/19/2022 0952   EOSABS 0.0 04/19/2022 0952   BASOSABS 0.0 04/19/2022 0952    Hgb A1C Lab Results  Component Value Date   HGBA1C 5.1 10/17/2019            Assessment & Plan:     RTC in 6 months for follow-up of chronic conditions Webb Silversmith, NP

## 2022-05-22 NOTE — Assessment & Plan Note (Signed)
Continue Gemtesa per urology

## 2022-05-22 NOTE — Assessment & Plan Note (Signed)
Currently only managed with hydrocodone Neurology is considering adding nortriptyline

## 2022-05-22 NOTE — Assessment & Plan Note (Signed)
Currently managed on Qulipta, propanolol and Fioricet He will continue to follow with neurology

## 2022-05-26 ENCOUNTER — Encounter: Payer: Self-pay | Admitting: Student in an Organized Health Care Education/Training Program

## 2022-05-26 ENCOUNTER — Ambulatory Visit
Payer: Worker's Compensation | Attending: Student in an Organized Health Care Education/Training Program | Admitting: Student in an Organized Health Care Education/Training Program

## 2022-05-26 VITALS — BP 176/89 | HR 54 | Temp 96.9°F | Resp 18 | Ht 69.0 in | Wt 139.0 lb

## 2022-05-26 DIAGNOSIS — M792 Neuralgia and neuritis, unspecified: Secondary | ICD-10-CM

## 2022-05-26 DIAGNOSIS — M47816 Spondylosis without myelopathy or radiculopathy, lumbar region: Secondary | ICD-10-CM | POA: Diagnosis not present

## 2022-05-26 DIAGNOSIS — G894 Chronic pain syndrome: Secondary | ICD-10-CM | POA: Diagnosis present

## 2022-05-26 MED ORDER — HYDROCODONE-ACETAMINOPHEN 10-325 MG PO TABS: 1.0000 | ORAL_TABLET | Freq: Two times a day (BID) | ORAL | 0 refills | Status: DC | PRN

## 2022-05-26 NOTE — Progress Notes (Signed)
Nursing Pain Medication Assessment:  Safety precautions to be maintained throughout the outpatient stay will include: orient to surroundings, keep bed in low position, maintain call bell within reach at all times, provide assistance with transfer out of bed and ambulation.  Medication Inspection Compliance: Pill count conducted under aseptic conditions, in front of the patient. Neither the pills nor the bottle was removed from the patient's sight at any time. Once count was completed pills were immediately returned to the patient in their original bottle.  Medication: Hydrocodone/APAP Pill/Patch Count:  58 of 90 pills remain Pill/Patch Appearance: Markings consistent with prescribed medication Bottle Appearance: Standard pharmacy container. Clearly labeled. Filled Date: 08 / 30 / 2023 Last Medication intake:  Today

## 2022-05-26 NOTE — Progress Notes (Signed)
PROVIDER NOTE: Information contained herein reflects review and annotations entered in association with encounter. Interpretation of such information and data should be left to medically-trained personnel. Information provided to patient can be located elsewhere in the medical record under "Patient Instructions". Document created using STT-dictation technology, any transcriptional errors that may result from process are unintentional.    Patient: Grant Young  Service Category: E/M  Provider: [name], MD  DOB: 20-Sep-1945  DOS: 05/26/2022  Specialty: Interventional Pain Management  MRN: [id number]  Setting: Ambulatory outpatient  PCP: [name], NP  Type: Established Patient    Referring Provider: [name], NP  Location: Office  Delivery: Face-to-face     HPI  [name], a 76 y.o. year old male, is here today because of his Chronic pain syndrome [G89.4]. 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 Pain Assessment: Severity of Chronic pain is reported as a 5 /10. Location: Back Lower/denies. Onset: More than a month ago. Quality: Aching. Timing: Constant. Modifying factor(s):  Marland Kitchen Vitals:  height is 5' 9" (1.753 m) and weight is 139 lb (63 kg). His temperature is 96.9 F (36.1 C) (abnormal). His blood pressure is 176/89 (abnormal) and his pulse is 54 (abnormal). His respiration is 18 and oxygen saturation is 100%.   Reason for encounter: medication management.   Grant Young follows up today for medication management.  He states that he has been having more indigestion and GI issues that he has a follow-up with GI for.  He states that he has reduced his hydrocodone to twice a day and would like to continue that.  We discussed how chronic opioid therapy can  impact GI health and peristalsis.  We will reduce his hydrocodone to 10 mg twice daily as needed.  He is also seeing neurology for occipital neuralgia as well as migraines.   Pharmacotherapy Assessment  Analgesic: Hydrocodone IR 10 mg BID prn    Monitoring: [place] PMP: PDMP reviewed during this encounter.       Pharmacotherapy: No side-effects or adverse reactions reported. Compliance: No problems identified. Effectiveness: Clinically acceptable.  Grant Shorter, RN  05/26/2022 10:24 AM  Sign when Signing Visit Nursing Pain Medication Assessment:  Safety precautions to be maintained throughout the outpatient stay will include: orient to surroundings, keep bed in low position, maintain call bell within reach at all times, provide assistance with transfer out of bed and ambulation.  Medication Inspection Compliance: Pill count conducted under aseptic conditions, in front of the patient. Neither the pills nor the bottle was removed from the patient's sight at any time. Once count was completed pills were immediately returned to the patient in their original bottle.  Medication: Hydrocodone/APAP Pill/Patch Count:  58 of 90 pills remain Pill/Patch Appearance: Markings consistent with prescribed medication Bottle Appearance: Standard pharmacy container. Clearly labeled. Filled Date: 08 / 30 / 2023 Last Medication intake:  Today   UDS:  Summary  Date Value Ref Range Status  09/17/2020 Note  Final    Comment:    ==================================================================== Compliance Drug Analysis, Ur ==================================================================== Specimen Alert Note: Urinary creatinine is low; ability to detect some drugs may be compromised. Interpret results with caution. (Creatinine) ==================================================================== Test  Result       Flag       Units  Drug Present and Declared for Prescription  Verification   Lorazepam                      1107         EXPECTED   ng/mg creat    Source of lorazepam is a scheduled prescription medication.    Butalbital                     PRESENT      EXPECTED   Citalopram                     PRESENT      EXPECTED   Desmethylcitalopram            PRESENT      EXPECTED    Desmethylcitalopram is an expected metabolite of citalopram or the    enantiomeric form, escitalopram.    Acetaminophen                  PRESENT      EXPECTED   Propranolol                    PRESENT      EXPECTED  Drug Present not Declared for Prescription Verification   Oxazepam                       300          UNEXPECTED ng/mg creat   Temazepam                      357          UNEXPECTED ng/mg creat    Oxazepam and temazepam are expected metabolites of diazepam.    Oxazepam is also an expected metabolite of other benzodiazepine    drugs, including chlordiazepoxide, prazepam, clorazepate, halazepam,    and temazepam.  Oxazepam and temazepam are available as scheduled    prescription medications.  Drug Absent but Declared for Prescription Verification   Hydrocodone                    Not Detected UNEXPECTED ng/mg creat   Tizanidine                     Not Detected UNEXPECTED    Tizanidine, as indicated in the declared medication list, is not    always detected even when used as directed.    Amitriptyline                  Not Detected UNEXPECTED   Prochlorperazine               Not Detected UNEXPECTED   Salicylate                     Not Detected UNEXPECTED    Aspirin, as indicated in the declared medication list, is not always    detected even when used as directed.  ==================================================================== Test                      Result    Flag   Units      Ref Range   Creatinine              14  LL     mg/dL      >=20 ==================================================================== Declared Medications:  The flagging and  interpretation on this report are based on the  following declared medications.  Unexpected results may arise from  inaccuracies in the declared medications.   **Note: The testing scope of this panel includes these medications:   Amitriptyline (Elavil)  Butalbital (Fioricet)  Butalbital (Fiorinal)  Escitalopram (Lexapro)  Hydrocodone (Norco)  Lorazepam (Ativan)  Prochlorperazine (Compazine)  Propranolol (Inderal)   **Note: The testing scope of this panel does not include small to  moderate amounts of these reported medications:   Acetaminophen (Fioricet)  Acetaminophen (Norco)  Aspirin (Fiorinal)  Tizanidine (Zanaflex)   **Note: The testing scope of this panel does not include the  following reported medications:   Anastrozole (Arimidex)  Caffeine (Fioricet)  Caffeine (Fiorinal)  Chlorthalidone (Hygroton)  Cholecalciferol  Cholestyramine (Questran)  Dicyclomine (Bentyl)  Ezetimibe (Zetia)  Hydrocortisone  Melatonin  Ondansetron (Zofran)  Potassium (Klor-Con)  Rabeprazole (Aciphex)  Sucralfate (Carafate)  Valacyclovir (Valtrex)  Vitamin B12 ==================================================================== For clinical consultation, please call [phone]. ====================================================================      ROS  Constitutional: Denies any fever or chills Gastrointestinal: No reported hemesis, hematochezia, vomiting, or acute GI distress Musculoskeletal:  Low back pain Neurological: No reported episodes of acute onset apraxia, aphasia, dysarthria, agnosia, amnesia, paralysis, loss of coordination, or loss of consciousness  Medication Review  Atogepant, Cholecalciferol, DHEA, Digestive Aids Mixture, HYDROcodone-acetaminophen, Loperamide HCl, Magnesium Bisglycinate, NONFORMULARY OR COMPOUNDED ITEM, Nutritional Supplements, Probiotic Product, RABEprazole, Vibegron, Vitamin B-12, anastrozole, aspirin, butalbital-acetaminophen-caffeine,  cetirizine, clopidogrel, diazepam, escitalopram, ezetimibe, ipratropium, melatonin, ondansetron, polyethylene glycol, propranolol, pyridostigmine, sucralfate, testosterone cypionate, and valACYclovir  History Review  Allergy: Grant Young is allergic to ace inhibitors, fluoxetine, metoclopramide, nalbuphine, other, amoxicillin-pot clavulanate, doxazosin, duloxetine, penicillins, tamsulosin, trazodone and nefazodone, amlodipine, cinoxacin, ciprofloxacin, fludrocortisone, nebivolol, olanzapine, olmesartan, pregabalin, prostaglandins, thyroid hormones, zolpidem, duloxetine hcl, fluoxetine hcl, nucynta [tapentadol], and phenytoin. Drug: Grant Young  reports that he does not currently use drugs. Alcohol:  reports current alcohol use. Tobacco:  reports that he has never smoked. He has never been exposed to tobacco smoke. He has never used smokeless tobacco. Social: Grant Young  reports that he has never smoked. He has never been exposed to tobacco smoke. He has never used smokeless tobacco. He reports current alcohol use. He reports that he does not currently use drugs. Medical:  has a past medical history of Anxiety, Chronic back pain, Chronic heart disease, Depression, GERD (gastroesophageal reflux disease), Headache, History of gastritis, History of neuropathy, Hypertension, Insomnia, Kidney stone, Lumbar radicular pain, Lyme disease, Myocardial infarction ([place]), Overactive bladder, PONV (postoperative nausea and vomiting), Skin cancer, basal cell, Spinal cord stimulator status, Squamous cell skin cancer, and Substance abuse ([place]). Surgical: Grant Young  has a past surgical history that includes Kidney stone surgery (09/14/1980); Kidney stone surgery (09/15/1995); Lithotripsy (09/14/2014); Lithotripsy (09/14/1996); Lithotripsy (09/14/1997); Gallbladder surgery (09/15/2015); Sigmoidoscopy (09/14/2017); Colonoscopy (09/15/2015); Cholecystectomy (2017); Shoulder surgery (Right, 1984); Back surgery; Spinal cord stimulator  implant; Pain pump implantation; Pain pump removal; Tonsillectomy; Foot surgery (Bilateral, 03/18/2020); Foot surgery (08/032021); Upper gi endoscopy; Cardiac catheterization; Colonoscopy with propofol (N/A, 10/03/2020); Esophagogastroduodenoscopy (egd) with propofol (N/A, 10/03/2020); Cataract extraction w/PHACO (Left, 01/14/2021); Cataract extraction w/PHACO (Right, 01/28/2021); Coronary/Graft Acute MI Revascularization (N/A, 11/27/2021); and LEFT HEART CATH AND CORONARY ANGIOGRAPHY (N/A, 11/27/2021). Family: family history includes Anxiety disorder in his daughter; Dementia in his mother; Heart disease in his father; Heart failure in his father; Hypertension in his father; Kidney Stones in his daughter;  OCD in his daughter; Stroke in his father and mother.  Laboratory Chemistry Profile   Renal Lab Results  Component Value Date   BUN 14 04/20/2022   CREATININE 0.68 04/20/2022   BCR 17 12/26/2021   GFRAA 101 05/20/2020   GFRNONAA >60 04/20/2022    Hepatic Lab Results  Component Value Date   AST 14 (L) 04/20/2022   ALT 14 04/20/2022   ALBUMIN 3.0 (L) 04/20/2022   ALKPHOS 94 04/20/2022    Electrolytes Lab Results  Component Value Date   NA 143 04/20/2022   K 3.5 04/20/2022   CL 108 04/20/2022   CALCIUM 9.0 04/20/2022   MG 2.0 11/28/2021   PHOS 3.6 11/30/2021    Bone Lab Results  Component Value Date   TESTOSTERONE 33.2 05/20/2020    Inflammation (CRP: Acute Phase) (ESR: Chronic Phase) No results found for: "CRP", "ESRSEDRATE", "LATICACIDVEN"       Note: Above Lab results reviewed.   Physical Exam  General appearance: Well nourished, well developed, and well hydrated. In no apparent acute distress Mental status: Alert, oriented x 3 (person, place, & time)       Respiratory: No evidence of acute respiratory distress Eyes: PERLA Vitals: BP (!) 176/89   Pulse (!) 54   Temp (!) 96.9 F (36.1 C)   Resp 18   Ht _0  (1.753 m)   Wt 139 lb (63 kg)   SpO2 100%   BMI 20.53 kg/m   BMI: Estimated body mass index is 20.53 kg/m as calculated from the following:   Height as of this encounter: _1  (1.753 m).   Weight as of this encounter: 139 lb (63 kg). Ideal: Ideal body weight: 70.7 kg (155 lb 13.8 oz)  Lumbar Spine Area Exam  Skin & Axial Inspection: Well healed scar from previous spine surgery detected IPG present from SCS Alignment: Scoliosis detected Functional ROM: Pain restricted ROM       Stability: No instability detected Muscle Tone/Strength: Functionally intact. No obvious neuro-muscular anomalies detected. Sensory (Neurological): Facet agenic pain pattern, pain with facet loading Gait & Posture Assessment  Ambulation: Unassisted Gait: Relatively normal for age and body habitus Posture: WNL    Lower Extremity Exam      Side: Right lower extremity   Side: Left lower extremity  Stability: No instability observed           Stability: No instability observed          Skin & Extremity Inspection: Skin color, temperature, and hair growth are WNL. No peripheral edema or cyanosis. No masses, redness, swelling, asymmetry, or associated skin lesions. No contractures.   Skin & Extremity Inspection: Skin color, temperature, and hair growth are WNL. No peripheral edema or cyanosis. No masses, redness, swelling, asymmetry, or associated skin lesions. No contractures.  Functional ROM: Unrestricted ROM                   Functional ROM: Unrestricted ROM                  Muscle Tone/Strength: Functionally intact. No obvious neuro-muscular anomalies detected.   Muscle Tone/Strength: Functionally intact. No obvious neuro-muscular anomalies detected.  Sensory (Neurological): Unimpaired         Sensory (Neurological): Unimpaired        DTR: Patellar: deferred today Achilles: deferred today Plantar: deferred today   DTR: Patellar: deferred today Achilles: deferred today Plantar: deferred today  Palpation: No palpable anomalies   Palpation: No palpable anomalies  Assessment   Status Diagnosis  Controlled Controlled Controlled 1. Chronic pain syndrome   2. Lumbar facet arthropathy   3. Lumbar spondylosis   4. Neuropathic pain       Plan of Care    Grant Young has a current medication list which includes the following long-term medication(s): cetirizine, escitalopram, ipratropium, propranolol, pyridostigmine, rabeprazole, sucralfate, and testosterone cypionate.  Pharmacotherapy (Medications Ordered): Meds ordered this encounter  Medications   HYDROcodone-acetaminophen (NORCO) 10-325 MG tablet    Sig: Take 1 tablet by mouth every 12 (twelve) hours as needed.    Dispense:  60 tablet    Refill:  0   HYDROcodone-acetaminophen (NORCO) 10-325 MG tablet    Sig: Take 1 tablet by mouth every 12 (twelve) hours as needed.    Dispense:  60 tablet    Refill:  0    Follow-up plan:   Return in about 10 weeks (around 08/04/2022) for Medication Management, in person.     Status post Botox No. 1 on 01/01/2020 for migraine, lumbar TPI as well. SPG block 03/13/20. Bilateral GONB 04/01/20. 11/13/20 B/L L3,4,5 Facet blocks helpful repeat prn           Recent Visits No visits were found meeting these conditions. Showing recent visits within past 90 days and meeting all other requirements Today's Visits Date Type Provider Dept  05/26/22 Office Visit [name], MD [facility]  Showing today's visits and meeting all other requirements Future Appointments Date Type Provider Dept  07/30/22 Appointment [name], MD [facility]  Showing future appointments within next 90 days and meeting all other requirements  I discussed the assessment and treatment plan with the patient. The patient was provided an opportunity to ask questions and all were answered. The patient agreed with the plan and demonstrated an understanding of the instructions.  Patient advised to call back or seek an in-person evaluation if the symptoms or  condition worsens.  Duration of encounter: 52mnutes.  Note by: BGillis Santa MD Date: 05/26/2022; Time: 11:00 AM

## 2022-05-27 ENCOUNTER — Telehealth: Payer: Self-pay | Admitting: Pharmacist

## 2022-05-27 NOTE — Telephone Encounter (Signed)
  Chronic Care Management   Outreach Note  05/27/2022 Name: ADRIEL KESSEN MRN: [id number] DOB: 29-May-1946  Referred by: [name], NP Reason for referral : No chief complaint on file.  Receive a message from PCP requesting follow up with patient regarding hypertension.   Was unable to reach patient via telephone today and have left HIPAA compliant voicemail asking patient to return my call.    Follow Up Plan: CM Pharmacist will outreach to patient by telephone on 9/27 at 1 pm, as previously scheduled  Wallace Cullens, PharmD, [place] [facility] [phone]

## 2022-05-28 ENCOUNTER — Encounter: Payer: Self-pay | Admitting: Internal Medicine

## 2022-05-29 ENCOUNTER — Ambulatory Visit: Payer: Medicare Other | Admitting: Pharmacist

## 2022-05-29 DIAGNOSIS — I10 Essential (primary) hypertension: Secondary | ICD-10-CM

## 2022-06-01 NOTE — Chronic Care Management (AMB) (Signed)
05/29/2022 Name: Grant Young MRN: [id number] DOB: 1945/09/18  I connected with Grant Young on 06/01/22 by telephone outreach and verified that I am speaking with the correct person using two identifiers.  Patient was referred to the pharmacist by their PCP for assistance in managing hypertension   Outreached patient to discuss hypertension control and medication management.   Outpatient Encounter Medications as of 05/29/2022  Medication Sig   anastrozole (ARIMIDEX) 1 MG tablet 1/2 tablet (0.5 mg) by mouth once weekly   aspirin 81 MG chewable tablet Chew 1 tablet (81 mg total) by mouth daily.   butalbital-acetaminophen-caffeine (FIORICET) 50-325-40 MG tablet Take 1 tablet by mouth every 6 (six) hours as needed for headache.   cetirizine (ZYRTEC) 10 MG tablet Take 10 mg by mouth daily as needed for allergies.   Cholecalciferol 50 MCG (2000 UT) CAPS Take 1 tablet by mouth daily.   clopidogrel (PLAVIX) 75 MG tablet Take 1 tablet (75 mg) by mouth once daily   Cyanocobalamin (VITAMIN B-12) 5000 MCG LOZG Take 1 lozenge by mouth daily.    DHEA 25 MG CAPS Take 25 mg by mouth daily.   diazepam (VALIUM) 5 MG tablet TAKE 1 TABLET IN THE MORNING AND 2 TABLETS AT BEDTIME   Digestive Aids Mixture (DIGESTION GB PO) Take 1 capsule by mouth daily.   escitalopram (LEXAPRO) 20 MG tablet TAKE 1 TABLET BY MOUTH EVERY DAY   ezetimibe (ZETIA) 10 MG tablet Take 10 mg by mouth daily.   [START ON 06/12/2022] HYDROcodone-acetaminophen (NORCO) 10-325 MG tablet Take 1 tablet by mouth every 12 (twelve) hours as needed.   [START ON 07/12/2022] HYDROcodone-acetaminophen (NORCO) 10-325 MG tablet Take 1 tablet by mouth every 12 (twelve) hours as needed.   ipratropium (ATROVENT) 0.06 % nasal spray Place 1 spray into both nostrils 4 (four) times daily as needed for rhinitis. For up to 5-7 days then stop.   Loperamide HCl (IMODIUM PO) Take by mouth as needed.    MAGNESIUM BISGLYCINATE PO Take by mouth daily.   melatonin  5 MG TABS Take 5 mg by mouth at bedtime.   NONFORMULARY OR COMPOUNDED ITEM Bi-Mix Papaverine '30mg'$ , Phentolamine '1mg'$    Dosage: Inject 1cc per injection    Vial 52m   Qty #5 Refills 6   NONFORMULARY OR COMPOUNDED ITEM Bi-Mix Papaverine '30mg'$ , Phentolamine '1mg'$    Dosage: Inject 1cc per injection    Vial 127m  Qty #5 Refills 6   Nutritional Supplements (NUTRITIONAL SUPPLEMENT PO) Take 1 capsule by mouth daily. [name]   Nutritional Supplements (NUTRITIONAL SUPPLEMENT PO) Take by mouth daily. InflammaSaver   ondansetron (ZOFRAN) 4 MG tablet Take by mouth.   polyethylene glycol (MIRALAX / GLYCOLAX) 17 g packet Take 17 g by mouth daily as needed.   Probiotic Product (PROBIOTIC DAILY PO) Take by mouth daily.   propranolol (INDERAL) 40 MG tablet Take 1 tablet (40 mg total) by mouth 2 (two) times daily.   pyridostigmine (MESTINON) 60 MG tablet Take 0.5 tablets (30 mg total) by mouth 2 (two) times daily.   QULIPTA 30 MG TABS Take 1 tablet by mouth daily.   RABEprazole (ACIPHEX) 20 MG tablet TAKE 1 TABLET BY MOUTH TWICE A DAY   sucralfate (CARAFATE) 1 g tablet Take 1 g by mouth 4 (four) times daily as needed.    testosterone cypionate (DEPOTESTOSTERONE CYPIONATE) 200 MG/ML injection Inject 80 mg into the muscle every 14 (fourteen) days.   valACYclovir (VALTREX) 1000 MG tablet Take 2  tabs p.o. and repeat in 12 hours as needed for cold sore   Vibegron (GEMTESA) 75 MG TABS Take 75 mg by mouth daily.   No facility-administered encounter medications on file as of 05/29/2022.    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 Reports based on his observation of this timing, he has stopped taking Qulipta this week to see if this will improve his BP. Reports has notified both PCP and Neurology  Home Monitoring: Patient has an automated upper arm home BP machine Counsel on BP monitoring technique Reports recent home readings have been elevated, 159/90 and greater  Admits to adding sea salt to his food Counsel patient on impact of salt/sodium on blood pressure control Encourage patient to review nutrition labels for sodium content of foods  Current physical activity: Walks a couple of times/week and aquatic fitness twice/week at community pool  Patient notes that he does have chronic pain for which he sees [facility] and Neurology (related to chronic headaches) Reports also recently has been having GI upset/ache, which he attirbutes to slower food transit time while on Norco.  Reports is working on getting in touch with [facility] to discuss this concern Discuss when monitoring home BP to note if actively in pain at time of readings on log  From review of chart, note on 8/31, patient seen for Office Visit with Dr. Halford Chessman at [facility]. Provider discussed with patient starting treatment for sleep apnea, including either CPAP or oral appliance.  Today encourage patient to consider CPAP as recommended by Pulmonologist Patient states not interested in CPAP at this  time   Assessment/Plan: - Currently uncontrolled - Reviewed appropriate administration of medication regimen - Reviewed appropriate home BP monitoring technique (avoid caffeine, smoking, and exercise for 30 minutes before checking, rest for at least 5 minutes before taking BP, sit with feet flat on the floor and back against a hard surface, uncross legs, and rest arm on flat surface) - Will send patient handout on BP monitoring technique and BP log as requested - Reviewed to check blood pressure, document, and provide at next provider visit. Patient to contact PCP or Cardiology sooner if readings outside of established parameters  - Discussed dietary modifications, such as reduced salt intake, focus on whole grains, vegetables, lean proteins - Encourage patient to consider contacting Pulmonology regarding starting CPAP or referral for oral appliance for sleep apnea - Recommend patient follow up with [facility] regarding complaints of GI upset   Follow Up Plan: CM Pharmacist will  outreach to patient by telephone again on 10/30 at 1 pm  Wallace Cullens, PharmD, [place] [phone]

## 2022-06-01 NOTE — Patient Instructions (Signed)
Check your blood pressure once daily, and any time you have concerning symptoms like headache, chest pain, dizziness, shortness of breath, or vision changes.    To appropriately check your blood pressure, make sure you do the following:  1) Avoid caffeine, exercise, or tobacco products for 30 minutes before checking. Empty your bladder. 2) Sit with your back supported in a flat-backed chair. Rest your arm on something flat (arm of the chair, table, etc). 3) Sit still with your feet flat on the floor, resting, for at least 5 minutes.  4) Check your blood pressure. Take 1-2 readings.  5) Write down these readings and bring with you to any provider appointments.  Bring your home blood pressure machine with you to a provider's office for accuracy comparison at least once a year.   Make sure you take your blood pressure medications before you come to any office visit, even if you were asked to fast for labs.  Wallace Cullens, PharmD, [place] [phone]

## 2022-06-04 ENCOUNTER — Ambulatory Visit
Admission: RE | Admit: 2022-06-04 | Discharge: 2022-06-04 | Disposition: A | Discharge status: Home / Self Care | Payer: Medicare Other | Source: Ambulatory Visit | Attending: Pulmonary Disease | Admitting: Pulmonary Disease

## 2022-06-04 DIAGNOSIS — I252 Old myocardial infarction: Secondary | ICD-10-CM | POA: Insufficient documentation

## 2022-06-04 DIAGNOSIS — R918 Other nonspecific abnormal finding of lung field: Secondary | ICD-10-CM | POA: Insufficient documentation

## 2022-06-04 DIAGNOSIS — I700 Atherosclerosis of aorta: Secondary | ICD-10-CM | POA: Diagnosis not present

## 2022-06-04 DIAGNOSIS — N200 Calculus of kidney: Secondary | ICD-10-CM | POA: Diagnosis not present

## 2022-06-04 DIAGNOSIS — Z85828 Personal history of other malignant neoplasm of skin: Secondary | ICD-10-CM | POA: Diagnosis not present

## 2022-06-04 LAB — GLUCOSE, CAPILLARY: Glucose-Capillary: 99 mg/dL (ref 70–99)

## 2022-06-04 MED ORDER — FLUDEOXYGLUCOSE F - 18 (FDG) INJECTION
7.2000 | Freq: Once | INTRAVENOUS | Status: AC | PRN
  Administered 2022-06-04: 7.85 via INTRAVENOUS

## 2022-06-10 ENCOUNTER — Telehealth: Payer: Self-pay | Admitting: Gastroenterology

## 2022-06-10 ENCOUNTER — Encounter: Payer: Self-pay | Admitting: Internal Medicine

## 2022-06-10 ENCOUNTER — Telehealth: Payer: Medicare Other

## 2022-06-10 MED ORDER — DIAZEPAM 5 MG PO TABS: ORAL_TABLET | ORAL | 0 refills | Status: DC

## 2022-06-10 NOTE — Telephone Encounter (Signed)
Per pt message may need medication and or appointment. Pt states he may have slow transit. Not sure what slow transit means. If he needs appointment please send back to me so I can schedule.

## 2022-06-12 ENCOUNTER — Ambulatory Visit: Payer: Self-pay | Admitting: *Deleted

## 2022-06-12 MED ORDER — DICYCLOMINE HCL 10 MG PO CAPS: 10.0000 mg | ORAL_CAPSULE | Freq: Three times a day (TID) | ORAL | 1 refills | Status: DC

## 2022-06-12 NOTE — Telephone Encounter (Signed)
  Chief Complaint: keeping PCP updated regarding BP. BP elevated  Symptoms: BP 148/71 HR 58 , rechecked for BP 179/90 HR 57, both BP with automatic BP monitor. Reports he has measured BP with manuel cuff and his automatic cuff and BP similar very close in results. Reports headaches and states that he always has headaches. 3 days ago patient started taking medication "chlorathiadone" 10 mg and BP lowered x 3 days ago .  9/23 BP 150/81 recheck 192/97. 9/25 BP 168/86 recheck 160/82.  Frequency: na  Pertinent Negatives: Patient denies chest pain no difficulty breathing no blurred vision no weakness of either side of body. Disposition: '[]'$ ED /'[]'$ Urgent Care (no appt availability in office) / '[x]'$ Appointment(In office/virtual)/ '[]'$  [facility]/ '[]'$ Home Care/ '[]'$ Refused Recommended Disposition /'[]'$ [facility]/ '[]'$  Follow-up with PCP Additional Notes:   Patient reports if he needs to go back on qulipta 30 mg ? Recommended OV . Patient requesting My Chart VV. Please advise if OV My Chart VV scheduled for 06/16/22 needed vs VV.  Please advise .    Reason for Disposition  [1] Taking BP medications AND [2] feels is having side effects (e.g., impotence, cough, dizzy upon standing)  Answer Assessment - Initial Assessment Questions 1. BLOOD PRESSURE: "What is the blood pressure?" "Did you take at least two measurements 5 minutes apart?"     BP 148/91 HR 58 5 minutes later BP 179/90 HR 59 2. ONSET: "When did you take your blood pressure?"     30 minutes prior to call 3. HOW: "How did you take your blood pressure?" (e.g., automatic home BP monitor, visiting nurse)     Automatic home BP 4. HISTORY: "Do you have a history of high blood pressure?"     Hx  5. MEDICINES: "Are you taking any medicines for blood pressure?" "Have you missed any doses recently?"     Yes  6. OTHER SYMPTOMS: "Do you have any symptoms?" (e.g., blurred vision, chest pain, difficulty breathing, headache, weakness)     No  has taken chlorathiadone . Headaches  7. PREGNANCY: "Is there any chance you are pregnant?" "When was your last menstrual period?"     na  Protocols used: Blood Pressure - High-A-AH

## 2022-06-12 NOTE — Addendum Note (Signed)
Addended by: [name] on: 06/12/2022 08:02 AM   Modules accepted: Orders

## 2022-06-12 NOTE — Telephone Encounter (Signed)
Called patient back and I had to leave him a detailed message letting him know to give me a call back or send me a message through MyChart letting know what symptoms he was currently having.

## 2022-06-15 ENCOUNTER — Encounter: Payer: Self-pay | Admitting: Internal Medicine

## 2022-06-15 NOTE — Telephone Encounter (Signed)
Pt advised.  Apt for 10/05 at 2:20  Thanks,   -Mickel Baas

## 2022-06-15 NOTE — Telephone Encounter (Signed)
Called patient but had to leave him a voicemail letting him know that Dr. Vicente Males wanted to see him.

## 2022-06-15 NOTE — Telephone Encounter (Signed)
I would prefer to see him in office and he needs to be made 40 minute for all appts

## 2022-06-16 ENCOUNTER — Ambulatory Visit (INDEPENDENT_AMBULATORY_CARE_PROVIDER_SITE_OTHER): Payer: Medicare Other | Admitting: Gastroenterology

## 2022-06-16 ENCOUNTER — Encounter: Payer: Self-pay | Admitting: Gastroenterology

## 2022-06-16 ENCOUNTER — Telehealth: Payer: Medicare Other | Admitting: Internal Medicine

## 2022-06-16 VITALS — BP 150/83 | HR 56 | Temp 97.8°F | Wt 135.8 lb

## 2022-06-16 DIAGNOSIS — I249 Acute ischemic heart disease, unspecified: Secondary | ICD-10-CM

## 2022-06-16 DIAGNOSIS — K5903 Drug induced constipation: Secondary | ICD-10-CM | POA: Diagnosis not present

## 2022-06-16 DIAGNOSIS — K219 Gastro-esophageal reflux disease without esophagitis: Secondary | ICD-10-CM

## 2022-06-16 DIAGNOSIS — T402X5A Adverse effect of other opioids, initial encounter: Secondary | ICD-10-CM

## 2022-06-16 MED ORDER — LINACLOTIDE 72 MCG PO CAPS: 72.0000 ug | ORAL_CAPSULE | Freq: Every day | ORAL | 0 refills | Status: DC

## 2022-06-16 NOTE — Progress Notes (Signed)
[name] MD, MRCP(U.K) [address]  [place]  [place], [place] [address]  Main: [phone]  Fax: [phone]   Primary Care Physician: [name], NP  Primary Gastroenterologist:  Dr. [name]   Chief Complaint  Patient presents with   Abdominal Pain    HPI: Grant Young is a 76 y.o. male Summary of history :   He was initially referred and seen in February 2021 for GERD.    He has had his gallbladder taken out in 2017.  He has been evaluated in the past by a gastroenterologist in [place] and underwent an upper endoscopy per his recollection which he recalls was normal except for a few small polyps.  He has also had a colonoscopy in the past.At his initial visit felt that his symptoms are suggestive of dyspepsia.  AcipHex which he was on was giving him good control of his symptoms.  Plan was to try him on peppermint oil capsules which she was provided samples of which.  Follow-up as needed.   Office visit in January 2022  for an elevated alkaline phosphatase level which was isolated and been elevated since 3 months.  He had his gamma GT checked which was negative.  Ultrasound of the liver shows no biliary obstruction.  He complains of pain in the right lower quadrant radiating from the back.  On and off episodic.  He has a history of diarrhea.  For many years.  No change.  Has cholestyramine available with him but has not taken it. C. difficile toxin was negative.  09/16/2020: Smooth muscle antibody, antimitochondrial antibody, entire autoimmune panel and viral hepatitis work-up was negative.,  GGT was also negative indicating that the alkaline phosphatase is not related to liver.  Not immunized to hepatitis a and B.   10/03/2020: EGD: Normal study, colonoscopy: Internal hemorrhoids noted medium in size.  No polyps seen random colon biopsies taken which were negative for microscopic colitis.  Biopsies of the terminal ileum were also normal.  3 rounds of hemorrhoidal  banding completed in October 2022.   Interval history 09/16/2020-11/03/2020   He says that over the past few months has been having some pain in the morning all over the abdomen cramping in nature which is relieved after bowel movement.  His bowel movements have been harder than usual he has been taking Norco.  In addition also has had increased burning sensation in his chest.  He takes Aciphex 20 mg twice a day.  No other complaints.  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      Current Outpatient Medications  Medication Sig Dispense Refill   anastrozole (ARIMIDEX) 1 MG tablet 1/2 tablet (0.5 mg) by mouth once weekly     aspirin 81 MG chewable tablet Chew 1 tablet (81 mg total) by mouth daily. 90 tablet 3   butalbital-acetaminophen-caffeine (FIORICET) 50-325-40 MG tablet Take 1 tablet by mouth every 6 (six) hours as needed for headache. 90 tablet 0   cetirizine (ZYRTEC) 10 MG tablet Take 10 mg by mouth daily as needed for allergies.     chlorthalidone (HYGROTON) 25 MG tablet Take 1 tablet by mouth daily.     Cholecalciferol 50 MCG (2000 UT) CAPS Take 1 tablet by mouth daily.     clopidogrel (PLAVIX) 75 MG tablet Take 1 tablet (75 mg) by mouth once daily 90 tablet 2   Cyanocobalamin (VITAMIN B-12) 5000 MCG LOZG Take 1 lozenge  by mouth daily.      DHEA 25 MG CAPS Take 25 mg by mouth daily.     diazepam (VALIUM) 5 MG tablet TAKE 1 TABLET IN THE MORNING AND 2 TABLETS AT BEDTIME 90 tablet 0   dicyclomine (BENTYL) 10 MG capsule Take 1 capsule (10 mg total) by mouth 4 (four) times daily -  before meals and at bedtime. 90 capsule 1   Digestive Aids Mixture (DIGESTION GB PO) Take 1 capsule by mouth daily.     escitalopram (LEXAPRO) 20 MG tablet TAKE 1 TABLET BY MOUTH EVERY DAY 90 tablet 0   ezetimibe (ZETIA) 10 MG tablet Take 10 mg by mouth daily.     HYDROcodone-acetaminophen (NORCO) 10-325 MG tablet Take 1 tablet by mouth every 12 (twelve) hours as  needed. 60 tablet 0   [START ON 07/12/2022] HYDROcodone-acetaminophen (NORCO) 10-325 MG tablet Take 1 tablet by mouth every 12 (twelve) hours as needed. 60 tablet 0   ipratropium (ATROVENT) 0.06 % nasal spray Place 1 spray into both nostrils 4 (four) times daily as needed for rhinitis. For up to 5-7 days then stop. 15 mL 0   Loperamide HCl (IMODIUM PO) Take by mouth as needed.      MAGNESIUM BISGLYCINATE PO Take by mouth daily.     melatonin 5 MG TABS Take 5 mg by mouth at bedtime.     NONFORMULARY OR COMPOUNDED ITEM Bi-Mix Papaverine '30mg'$ , Phentolamine '1mg'$    Dosage: Inject 1cc per injection    Vial 75m   Qty #5 Refills 6 5 each 6   NONFORMULARY OR COMPOUNDED ITEM Bi-Mix Papaverine '30mg'$ , Phentolamine '1mg'$    Dosage: Inject 1cc per injection    Vial 112m  Qty #5 Refills 6 5 each 6   Nutritional Supplements (NUTRITIONAL SUPPLEMENT PO) Take 1 capsule by mouth daily. [name]     Nutritional Supplements (NUTRITIONAL SUPPLEMENT PO) Take by mouth daily. InflammaSaver     ondansetron (ZOFRAN) 4 MG tablet Take by mouth.     polyethylene glycol (MIRALAX / GLYCOLAX) 17 g packet Take 17 g by mouth daily as needed.     Probiotic Product (PROBIOTIC DAILY PO) Take by mouth daily.     propranolol (INDERAL) 40 MG tablet Take 1 tablet (40 mg total) by mouth 2 (two) times daily. 180 tablet 1   pyridostigmine (MESTINON) 60 MG tablet Take 0.5 tablets (30 mg total) by mouth 2 (two) times daily. 90 tablet 0   QULIPTA 30 MG TABS Take 1 tablet by mouth daily.     RABEprazole (ACIPHEX) 20 MG tablet TAKE 1 TABLET BY MOUTH TWICE A DAY 180 tablet 0   sucralfate (CARAFATE) 1 g tablet Take 1 g by mouth 4 (four) times daily as needed.      sulfamethoxazole-trimethoprim (BACTRIM) 400-80 MG tablet Take 1 tablet by mouth in the morning and at bedtime.     testosterone cypionate (DEPOTESTOSTERONE CYPIONATE) 200 MG/ML injection Inject 80 mg into the muscle every 14 (fourteen) days.     valACYclovir  (VALTREX) 1000 MG tablet Take 2 tabs p.o. and repeat in 12 hours as needed for cold sore 30 tablet 0   Vibegron (GEMTESA) 75 MG TABS Take 75 mg by mouth daily. 90 tablet 3   No current facility-administered medications for this visit.    Allergies as of 06/16/2022 - Review Complete 06/16/2022  Allergen Reaction Noted   Ace inhibitors  08/24/2019   Fluoxetine Anxiety 10/31/2019   Metoclopramide  10/31/2019   Nalbuphine  10/31/2019   Other  01/03/2014   Amoxicillin-pot clavulanate Nausea Only 10/31/2019   Doxazosin Rash 02/01/2013   Duloxetine Nausea Only 10/31/2019   Penicillins Nausea Only 10/31/2019   Tamsulosin Itching and Anxiety 02/01/2013   Trazodone and nefazodone Itching, Anxiety, and Rash 02/13/2012   Amlodipine  10/31/2019   Cinoxacin  10/31/2019   Ciprofloxacin  08/24/2019   Fludrocortisone Other (See Comments) 08/04/2021   Nebivolol  08/24/2019   Olanzapine  10/31/2019   Olmesartan  08/24/2019   Pregabalin  10/31/2019   Prostaglandins  05/07/2014   Thyroid hormones  06/09/2018   Zolpidem  10/31/2019   Duloxetine hcl  01/03/2014   Fluoxetine hcl  01/03/2014   Nucynta [tapentadol] Other (See Comments) 07/15/2021   Phenytoin Anxiety 10/31/2019    ROS:  General: Negative for anorexia, weight loss, fever, chills, fatigue, weakness. ENT: Negative for hoarseness, difficulty swallowing , nasal congestion. CV: Negative for chest pain, angina, palpitations, dyspnea on exertion, peripheral edema.  Respiratory: Negative for dyspnea at rest, dyspnea on exertion, cough, sputum, wheezing.  GI: See history of present illness. GU:  Negative for dysuria, hematuria, urinary incontinence, urinary frequency, nocturnal urination.  Endo: Negative for unusual weight change.    Physical Examination:   BP (!) 150/83   Pulse (!) 56   Temp 97.8 F (36.6 C) (Oral)   Wt 135 lb 12.8 oz (61.6 kg)   BMI 20.05 kg/m   General: Well-nourished, well-developed in no acute distress.   Eyes: No icterus. Conjunctivae pink. Abdomen: Bowel sounds are normal, nontender, nondistended, no hepatosplenomegaly or masses, no abdominal bruits or hernia , no rebound or guarding.   Extremities: No lower extremity edema. No clubbing or deformities. Neuro: Alert and oriented x 3.  Grossly intact. Skin: Warm and dry, no jaundice.   Psych: Alert and cooperative, normal mood and affect.   Imaging Studies: NM PET Image Initial (PI) Skull Base To Thigh (F-18 FDG)  Result Date: 06/05/2022 CLINICAL DATA:  Initial treatment strategy for right lung mass. EXAM: NUCLEAR MEDICINE PET SKULL BASE TO THIGH TECHNIQUE: 7.9 mCi F-18 FDG was injected intravenously. Full-ring PET imaging was performed from the skull base to thigh after the radiotracer. CT data was obtained and used for attenuation correction and anatomic localization. Fasting blood glucose: 99 mg/dl COMPARISON:  CT chest 04/19/2022. FINDINGS: Mediastinal blood pool activity: SUV max 1.6 Liver activity: SUV max NA NECK: No abnormal hypermetabolism. Incidental CT findings: None. CHEST: Mild right hilar hypermetabolism, SUV max 2.0. Correlation with CT is challenging without IV contrast. Hypermetabolic nodular and masslike areas of consolidation in the lungs measure up to 2.7 x 3.7 cm in the posterior right lower lobe, SUV max 6.4. Largest lesion has decreased in size in the interval, previously measuring 3.2 x 4.9 cm. Similarly, there has been some improvement in right upper lobe consolidation and bilateral nodularity, some of which is also hypermetabolic. Incidental CT findings: Atherosclerotic calcification of the aorta, aortic valve and coronary arteries. Heart is at the upper limits of normal in size. No pericardial or pleural effusion. ABDOMEN/PELVIS: No abnormal hypermetabolism in the liver, adrenal glands, spleen or pancreas. No hypermetabolic lymph nodes. Incidental CT findings: Liver is unremarkable. Cholecystectomy. Adrenal glands are  unremarkable. Stones in the kidneys bilaterally. Subcentimeter low-attenuation lesion in the left kidney, too small to characterize. No specific follow-up necessary. Spleen, pancreas, stomach and bowel are grossly unremarkable. Atherosclerotic calcification of the aorta. SKELETON: No abnormal hypermetabolism. Incidental CT findings: Degenerative and postoperative changes in the spine. Spinal stimulator wires  seen in the thoracic spine. Postoperative changes in the right glenoid. IMPRESSION: 1. Interval improvement in multi lobar airspace consolidation with associated hypermetabolism. An infectious etiology, namely atypical/fungal, strongly suspected. Recommend follow-up CT chest without contrast in 4-6 weeks in further evaluation to document continued improvement and exclude underlying malignancy. 2. Mild right hilar hypermetabolism, likely reactive. 3. Bilateral renal stones. 4. Aortic atherosclerosis (ICD10-I70.0). Coronary artery calcification. Electronically Signed   By: Lorin Picket M.D.   On: 06/05/2022 13:06    Assessment and Plan:   Grant Young is a 76 y.o. y/o male who has had a prior history of bile salt mediated diarrhea here today to see me for abdominal pain ongoing for a few months which she has first thing in the morning relieved after bowel movement associated with constipation.  He has been taking Norco recently in addition noticed increased heartburn.  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  Explained to him that the opioids is likely causing decreased colonic motility leading to constipation in addition to decreasing gastric emptying and leading to gastroparesis and acid reflux.  Hence explained we will start him on Linzess 72 mcg once a day samples will be provided for 1 week if he does well on it he will be given a prescription for 90 days if he does not do well on it we may have to increase the dose and he should do so with further samples till he gets the dose that is  optimal for him.  In terms of his acid reflux I suggested he increase his Aciphex to 40 mg twice a day till her symptoms have resolved and can return to 20 mg twice daily.  I have advised to send me a message on the portal for titrating the dose of his medications  Dr [name]  MD,MRCP [facility]) Follow up in as needed

## 2022-06-16 NOTE — Telephone Encounter (Signed)
Patient was seen today by Dr. Vicente Males.

## 2022-06-16 NOTE — Telephone Encounter (Signed)
We will discuss all this at his upcoming appointment.  Has he been changed to 40-minute appointments?

## 2022-06-16 NOTE — Patient Instructions (Signed)
Linzess 4mg samples given

## 2022-06-16 NOTE — Telephone Encounter (Signed)
Yes, I rescheduled him to a 40 slot.   Thanks,   -Mickel Baas

## 2022-06-18 ENCOUNTER — Encounter: Payer: Self-pay | Admitting: Internal Medicine

## 2022-06-18 ENCOUNTER — Ambulatory Visit (INDEPENDENT_AMBULATORY_CARE_PROVIDER_SITE_OTHER): Payer: Medicare Other | Admitting: Internal Medicine

## 2022-06-18 VITALS — BP 148/82 | HR 58 | Temp 96.9°F | Wt 134.0 lb

## 2022-06-18 DIAGNOSIS — I10 Essential (primary) hypertension: Secondary | ICD-10-CM

## 2022-06-18 DIAGNOSIS — N401 Enlarged prostate with lower urinary tract symptoms: Secondary | ICD-10-CM | POA: Diagnosis not present

## 2022-06-18 DIAGNOSIS — I249 Acute ischemic heart disease, unspecified: Secondary | ICD-10-CM | POA: Diagnosis not present

## 2022-06-18 DIAGNOSIS — G629 Polyneuropathy, unspecified: Secondary | ICD-10-CM

## 2022-06-18 DIAGNOSIS — R350 Frequency of micturition: Secondary | ICD-10-CM

## 2022-06-18 DIAGNOSIS — G43719 Chronic migraine without aura, intractable, without status migrainosus: Secondary | ICD-10-CM

## 2022-06-18 DIAGNOSIS — N3281 Overactive bladder: Secondary | ICD-10-CM | POA: Diagnosis not present

## 2022-06-18 DIAGNOSIS — G2581 Restless legs syndrome: Secondary | ICD-10-CM

## 2022-06-18 MED ORDER — NORTRIPTYLINE HCL 10 MG PO CAPS: 10.0000 mg | ORAL_CAPSULE | Freq: Every day | ORAL | 0 refills | Status: DC

## 2022-06-18 MED ORDER — PROPRANOLOL HCL ER 60 MG PO CP24: 60.0000 mg | ORAL_CAPSULE | Freq: Every day | ORAL | 0 refills | Status: DC

## 2022-06-18 NOTE — Patient Instructions (Signed)
How to Take Your Blood Pressure Blood pressure measures how strongly your blood is pressing against the walls of your arteries. Arteries are blood vessels that carry blood from your heart throughout your body. You can take your blood pressure at home with a machine. You may need to check your blood pressure at home: To check if you have high blood pressure (hypertension). To check your blood pressure over time. To make sure your blood pressure medicine is working. Supplies needed: Blood pressure machine, or monitor. A chair to sit in. This should be a chair where you can sit upright with your back supported. Do not sit on a soft couch or an armchair. Table or desk. Small notebook. Pencil or pen. How to prepare Avoid these things for 30 minutes before checking your blood pressure: Having drinks with caffeine in them, such as coffee or tea. Drinking alcohol. Eating. Smoking. Exercising. Do these things five minutes before checking your blood pressure: Go to the bathroom and pee (urinate). Sit in a chair. Be quiet. Do not talk. How to take your blood pressure Follow the instructions that came with your machine. If you have a digital blood pressure monitor, these may be the instructions: Sit up straight. Place your feet on the floor. Do not cross your ankles or legs. Rest your left arm at the level of your heart. You may rest it on a table, desk, or chair. Pull up your shirt sleeve. Wrap the blood pressure cuff around the upper part of your left arm. The cuff should be 1 inch (2.5 cm) above your elbow. It is best to wrap the cuff around bare skin. Fit the cuff snugly around your arm, but not too tightly. You should be able to place only one finger between the cuff and your arm. Place the cord so that it rests in the bend of your elbow. Press the power button. Sit quietly while the cuff fills with air and loses air. Write down the numbers on the screen. Wait 2-3 minutes and then repeat  steps 1-10. What do the numbers mean? Two numbers make up your blood pressure. The first number is called systolic pressure. The second is called diastolic pressure. An example of a blood pressure reading is "120 over 80" (or 120/80). If you are an adult and do not have a medical condition, use this guide to find out if your blood pressure is normal: Normal First number: below 120. Second number: below 80. Elevated First number: 120-129. Second number: below 80. Hypertension stage 1 First number: 130-139. Second number: 80-89. Hypertension stage 2 First number: 140 or above. Second number: 90 or above. Your blood pressure is above normal even if only the first or only the second number is above normal. Follow these instructions at home: Medicines Take over-the-counter and prescription medicines only as told by your doctor. Tell your doctor if your medicine is causing side effects. General instructions Check your blood pressure as often as your doctor tells you to. Check your blood pressure at the same time every day. Take your monitor to your next doctor's appointment. Your doctor will: Make sure you are using it correctly. Make sure it is working right. Understand what your blood pressure numbers should be. Keep all follow-up visits. General tips You will need a blood pressure machine or monitor. Your doctor can suggest a monitor. You can buy one at a drugstore or online. When choosing one: Choose one with an arm cuff. Choose one that wraps around your   upper arm. Only one finger should fit between your arm and the cuff. Do not choose one that measures your blood pressure from your wrist or finger. Where to find more information American Heart Association: www.heart.org Contact a doctor if: Your blood pressure keeps being high. Your blood pressure is suddenly low. Get help right away if: Your first blood pressure number is higher than 180. Your second blood pressure number is  higher than 120. These symptoms may be an emergency. Do not wait to see if the symptoms will go away. Get help right away. Call 911. Summary Check your blood pressure at the same time every day. Avoid caffeine, alcohol, smoking, and exercise for 30 minutes before checking your blood pressure. Make sure you understand what your blood pressure numbers should be. This information is not intended to replace advice given to you by your health care provider. Make sure you discuss any questions you have with your health care provider. Document Revised: 05/15/2021 Document Reviewed: 05/15/2021 Elsevier Patient Education  2023 [employer]  

## 2022-06-18 NOTE — Progress Notes (Signed)
Subjective:    Patient ID: Grant Young, male    DOB: 1946/04/08, 76 y.o.   MRN: [id number]  HPI  Patient presents to clinic today for follow-up of HTN.  This is currently managed on Chlorthalidone and Propanolol.  His BP today is 146/84. His BP at home ranges 120/69- 179/90.  ECG from 04/2022 reviewed. He has failed Labetalol,  Amlodipine, Olmesartan in the past.   He also wants to discuss his migraines and neuropathy.  He is currently taking Propranolol, Fioricet as prescribed by neurology.  He stopped Qulipta as he thought this was leading to his high blood pressures.  He has failed multiple migraine medications as well as Gabapentin and Lyrica in the past.  He reports neurology discussed the possibility of starting Nortriptyline and he would like to get started on this.  Review of Systems     Past Medical History:  Diagnosis Date   Anxiety    Per New Patient Packet   Chronic back pain    Per New Patient Packet   Chronic heart disease    Per New Patient Packet   Depression    GERD (gastroesophageal reflux disease)    Headache    History of gastritis    Per New Patient Packet   History of neuropathy    Per New Patient Packet   Hypertension    Per New Patient Packet   Insomnia    Kidney stone    Lumbar radicular pain    Per New Patient Packet   Lyme disease    Per New Patient Packet   Myocardial infarction [facility])    Overactive bladder    PONV (postoperative nausea and vomiting)    Skin cancer, basal cell    Spinal cord stimulator status    01/08/21 - not currently using.   Squamous cell skin cancer    Substance abuse (HCC)     Current Outpatient Medications  Medication Sig Dispense Refill   anastrozole (ARIMIDEX) 1 MG tablet 1/2 tablet (0.5 mg) by mouth once weekly     aspirin 81 MG chewable tablet Chew 1 tablet (81 mg total) by mouth daily. 90 tablet 3   butalbital-acetaminophen-caffeine (FIORICET) 50-325-40 MG tablet Take 1 tablet by mouth every 6 (six) hours as  needed for headache. 90 tablet 0   cetirizine (ZYRTEC) 10 MG tablet Take 10 mg by mouth daily as needed for allergies.     chlorthalidone (HYGROTON) 25 MG tablet Take 1 tablet by mouth daily.     Cholecalciferol 50 MCG (2000 UT) CAPS Take 1 tablet by mouth daily.     clopidogrel (PLAVIX) 75 MG tablet Take 1 tablet (75 mg) by mouth once daily 90 tablet 2   Cyanocobalamin (VITAMIN B-12) 5000 MCG LOZG Take 1 lozenge by mouth daily.      DHEA 25 MG CAPS Take 25 mg by mouth daily.     diazepam (VALIUM) 5 MG tablet TAKE 1 TABLET IN THE MORNING AND 2 TABLETS AT BEDTIME 90 tablet 0   dicyclomine (BENTYL) 10 MG capsule Take 1 capsule (10 mg total) by mouth 4 (four) times daily -  before meals and at bedtime. 90 capsule 1   Digestive Aids Mixture (DIGESTION GB PO) Take 1 capsule by mouth daily.     escitalopram (LEXAPRO) 20 MG tablet TAKE 1 TABLET BY MOUTH EVERY DAY 90 tablet 0   ezetimibe (ZETIA) 10 MG tablet Take 10 mg by mouth daily.     HYDROcodone-acetaminophen (NORCO) 10-325 MG  tablet Take 1 tablet by mouth every 12 (twelve) hours as needed. 60 tablet 0   [START ON 07/12/2022] HYDROcodone-acetaminophen (NORCO) 10-325 MG tablet Take 1 tablet by mouth every 12 (twelve) hours as needed. 60 tablet 0   ipratropium (ATROVENT) 0.06 % nasal spray Place 1 spray into both nostrils 4 (four) times daily as needed for rhinitis. For up to 5-7 days then stop. 15 mL 0   linaclotide (LINZESS) 72 MCG capsule Take 1 capsule (72 mcg total) by mouth daily before breakfast. 12 capsule 0   Loperamide HCl (IMODIUM PO) Take by mouth as needed.      MAGNESIUM BISGLYCINATE PO Take by mouth daily.     melatonin 5 MG TABS Take 5 mg by mouth at bedtime.     NONFORMULARY OR COMPOUNDED ITEM Bi-Mix Papaverine '30mg'$ , Phentolamine '1mg'$    Dosage: Inject 1cc per injection    Vial 56m   Qty #5 Refills 6 5 each 6   NONFORMULARY OR COMPOUNDED ITEM Bi-Mix Papaverine '30mg'$ , Phentolamine '1mg'$    Dosage: Inject 1cc per injection     Vial 134m  Qty #5 Refills 6 5 each 6   Nutritional Supplements (NUTRITIONAL SUPPLEMENT PO) Take 1 capsule by mouth daily. [name]     Nutritional Supplements (NUTRITIONAL SUPPLEMENT PO) Take by mouth daily. InflammaSaver     ondansetron (ZOFRAN) 4 MG tablet Take by mouth.     polyethylene glycol (MIRALAX / GLYCOLAX) 17 g packet Take 17 g by mouth daily as needed.     Probiotic Product (PROBIOTIC DAILY PO) Take by mouth daily.     propranolol (INDERAL) 40 MG tablet Take 1 tablet (40 mg total) by mouth 2 (two) times daily. 180 tablet 1   pyridostigmine (MESTINON) 60 MG tablet Take 0.5 tablets (30 mg total) by mouth 2 (two) times daily. 90 tablet 0   QULIPTA 30 MG TABS Take 1 tablet by mouth daily.     RABEprazole (ACIPHEX) 20 MG tablet TAKE 1 TABLET BY MOUTH TWICE A DAY 180 tablet 0   sucralfate (CARAFATE) 1 g tablet Take 1 g by mouth 4 (four) times daily as needed.      sulfamethoxazole-trimethoprim (BACTRIM) 400-80 MG tablet Take 1 tablet by mouth in the morning and at bedtime.     testosterone cypionate (DEPOTESTOSTERONE CYPIONATE) 200 MG/ML injection Inject 80 mg into the muscle every 14 (fourteen) days.     valACYclovir (VALTREX) 1000 MG tablet Take 2 tabs p.o. and repeat in 12 hours as needed for cold sore 30 tablet 0   Vibegron (GEMTESA) 75 MG TABS Take 75 mg by mouth daily. 90 tablet 3   No current facility-administered medications for this visit.    Allergies  Allergen Reactions   Ace Inhibitors     Other reaction(s): Cough   Fluoxetine Anxiety    "made me fall asleep" per pt "bad headaches and "makes  Me  Crazy" historical allergy noted in [employer] "made me fall asleep" per pt "bad headaches and "makes  Me  Crazy" Per New Patient Packet.     Metoclopramide     Other reaction(s): Other (See Comments), Other (See Comments), Unknown Tardive Dyskinesia  historical allergy noted in [employer] Tardive Dyskinesia  Per New Patient Packet.   Nalbuphine      Used Post Back surgery- Anesthesiologist Error. Patient had Narcotic Withdraw. Per New Patient Packet.    Other     Other reaction(s): Other (See Comments) Altered mental status in combo with narcotics at  previous hospitalization - Full Withdrawal Symptoms Other reaction(s): Rash   Amoxicillin-Pot Clavulanate Nausea Only    Per New Patient Packet.   Doxazosin Rash    Other reaction(s): Other - See Comments, Rash UNKNOWN REACTION UNKNOWN REACTION    Duloxetine Nausea Only    Per New Patient Packet.    Penicillins Nausea Only    Per New Patient Packet.   Tamsulosin Itching and Anxiety    Restless, Flushing, Heavy Chest, Itching, Hyperactive mood and Anxiety. Unable to handle side effects. Per New Patient Packet.     Trazodone And Nefazodone Itching, Anxiety and Rash    Headache. "INCREASED MY ANXIETY AND HEARTRATE" Flushing, tachycardia "INCREASED MY ANXIETY AND HEARTRATE" Per New Patient Packet.    Amlodipine     Shaking, unsure of reaction. Per New Patient Packet.     Cinoxacin     GI Intolerance, and Dizziness. Per New Patient Packet.    Ciprofloxacin     Other reaction(s): Unknown   Fludrocortisone Other (See Comments)    "Worsening headaches, GI issues, fatigue"   Nebivolol     Other reaction(s): Unknown   Olanzapine     Headache and unable to sleep for 3 nights. Per New Patient Packet.    Olmesartan     Other reaction(s): Unknown   Pregabalin     Confusion, Lack of concentration, dizziness, and likely drowsiness. Per New Patient Packet.     Prostaglandins     Other reaction(s): Other (See Comments) Intolerance   Thyroid Hormones     Other reaction(s): Other (See Comments) Thyroid (Nature Thyroid) contraindicated with some of your other medications.   Zolpidem     Nightmares, Ineffective after 2 days. Per New Patient Packet.    Duloxetine Hcl     Other reaction(s): Rash   Fluoxetine Hcl     Other reaction(s): Rash   Nucynta [Tapentadol] Other (See Comments)     Vertigo    Phenytoin Anxiety    Hyperactivity, and Ineffective. Per New Patient Packet.     Family History  Problem Relation Age of Onset   Heart disease Father    Heart failure Father    Hypertension Father    Stroke Father    Stroke Mother    Dementia Mother    Kidney Stones Daughter    Anxiety disorder Daughter    OCD Daughter     Social History   Socioeconomic History   Marital status: Married    Spouse name: Not on file   Number of children: Not on file   Years of education: Not on file   Highest education level: Not on file  Occupational History   Not on file  Tobacco Use   Smoking status: Never    Passive exposure: Never   Smokeless tobacco: Never  Vaping Use   Vaping Use: Never used  Substance and Sexual Activity   Alcohol use: Yes    Comment: 1 Drink a Month, socially   Drug use: Not Currently   Sexual activity: Yes    Birth control/protection: None  Other Topics Concern   Not on file  Social History Narrative   Tobacco use, amount per day now: None   Past tobacco use, amount per day: None   How many years did you use tobacco: 0   Alcohol use (drinks per week): 0-1 Month   Diet:   Do you drink/eat things with caffeine: Occasionally ( Hot Chocolate and maybe 1/4 of 16oz Pepsi 2-3 times a week.  Marital status: Married                                  What year were you married? 1970   Do you live in a house, apartment, assisted living, condo, trailer, etc.? House   Is it one or more stories? One   How many persons live in your home? 2   Do you have pets in your home?( please list)  No   Highest Level of education completed: Masters   Current or past profession: Intensive Transport planner for [employer]   Do you exercise? Yes                                    Type and how often? [place], Recumbent Bike, 3 times a week. Try to get a 1.2 mile walk at least 3 times a week or more.     Do you have a living will? Yes   Do you have a  DNR form?  No                                 If not, do you want to discuss one?   Do you have signed POA/HPOA forms? Yes                       If so, please bring to you appointment    Do you have any difficulty bathing or dressing yourself? No    Do you have difficulty preparing food or eating? No   Do you have difficulty managing your medications? No   Do you have any difficulty managing your finances? No   Do you have any difficulty affording your medications? No         Social Determinants of Radio broadcast assistant Strain: Not on file  Food Insecurity: Not on file  Transportation Needs: Not on file  Physical Activity: Not on file  Stress: Not on file  Social Connections: Not on file  Intimate Partner Violence: Not on file     Constitutional: Pt reports headaches. Denies fever, malaise, fatigue, or abrupt weight changes.  HEENT: Denies eye pain, eye redness, ear pain, ringing in the ears, wax buildup, runny nose, nasal congestion, bloody nose, or sore throat. Respiratory: Denies difficulty breathing, shortness of breath, cough or sputum production.   Cardiovascular: Denies chest pain, chest tightness, palpitations or swelling in the hands or feet.  Gastrointestinal: Denies abdominal pain, bloating, constipation, diarrhea or blood in the stool.  GU: Pt reports urinary frequency. Denies urgency, pain with urination, burning sensation, blood in urine, odor or discharge. Neurological: Pt reports peripheral neuropathy. Denies dizziness, difficulty with memory, difficulty with speech or problems with balance and coordination.    No other specific complaints in a complete review of systems (except as listed in HPI above).  Objective:   Physical Exam  BP (!) 148/82 (BP Location: Left Arm, Patient Position: Sitting, Cuff Size: Normal)   Pulse (!) 58   Temp (!) 96.9 F (36.1 C) (Temporal)   Wt 134 lb (60.8 kg)   SpO2 100%   BMI 19.79 kg/m   Wt Readings from Last 3  Encounters:  06/16/22 135 lb 12.8 oz (61.6 kg)  05/26/22 139 lb (  63 kg)  05/22/22 137 lb 12.8 oz (62.5 kg)    General: Appears his stated age, chronically ill appearing, in NAD. HEENT: Head: normal shape and size; Eyes: sclera white, no icterus, conjunctiva pink, PERRLA and EOMs intact;  Cardiovascular: Bradycardic with normal rhythm. S1,S2 noted.  No murmur, rubs or gallops noted. No JVD or BLE edema.  Pulmonary/Chest: Normal effort and positive vesicular breath sounds. No respiratory distress. No wheezes, rales or ronchi noted.  Musculoskeletal: No difficulty with gait.  Neurological: Alert and oriented. Sensation decreased to BLE.   BMET    Component Value Date/Time   NA 143 04/20/2022 0535   NA 146 (H) 12/26/2021 1524   K 3.5 04/20/2022 0535   CL 108 04/20/2022 0535   CO2 26 04/20/2022 0535   GLUCOSE 95 04/20/2022 0535   BUN 14 04/20/2022 0535   BUN 14 12/26/2021 1524   CREATININE 0.68 04/20/2022 0535   CALCIUM 9.0 04/20/2022 0535   GFRNONAA >60 04/20/2022 0535   GFRAA 101 05/20/2020 0000    Lipid Panel     Component Value Date/Time   CHOL 170 11/11/2021 1006   TRIG 99 11/11/2021 1006   HDL 48 11/11/2021 1006   CHOLHDL 3.5 11/11/2021 1006   LDLCALC 103 (H) 11/11/2021 1006    CBC    Component Value Date/Time   WBC 7.8 04/20/2022 0535   RBC 4.64 04/20/2022 0535   HGB 13.2 04/20/2022 0535   HGB 14.4 12/26/2021 1524   HCT 39.8 04/20/2022 0535   HCT 43.5 12/26/2021 1524   PLT 256 04/20/2022 0535   PLT 202 12/26/2021 1524   MCV 85.8 04/20/2022 0535   MCV 90 12/26/2021 1524   MCH 28.4 04/20/2022 0535   MCHC 33.2 04/20/2022 0535   RDW 12.5 04/20/2022 0535   RDW 13.3 12/26/2021 1524   LYMPHSABS 1.2 04/19/2022 0952   MONOABS 1.2 (H) 04/19/2022 0952   EOSABS 0.0 04/19/2022 0952   BASOSABS 0.0 04/19/2022 0952    Hgb A1C Lab Results  Component Value Date   HGBA1C 5.1 10/17/2019           Assessment & Plan:   HTN, Headaches, OAB, Peripheral  Neuropathy:  He is concerned about his medications working against each other and I am in agreement with this I do not think chlorthalidone and propranolol will manage his HTN We have agreed to try to go up on Propanolol to 60 mg twice daily, continue chlorthalidone at this time If this is ineffective, we will stop chlorthalidone and transition to an ARB. Will start Nortriptyline, he will follow-up with neurology to see if they want him to restart Lenoria Chime Advised him to monitor blood pressures, heart rates, migraines and neuropathic symptoms and update me in 1 month  RTC in 5 months for follow-up of chronic conditions Webb Silversmith, NP

## 2022-06-27 ENCOUNTER — Other Ambulatory Visit: Payer: Self-pay | Admitting: Internal Medicine

## 2022-06-27 DIAGNOSIS — F419 Anxiety disorder, unspecified: Secondary | ICD-10-CM

## 2022-06-28 ENCOUNTER — Other Ambulatory Visit: Payer: Self-pay | Admitting: Internal Medicine

## 2022-06-29 NOTE — Telephone Encounter (Signed)
Requested Prescriptions  Pending Prescriptions Disp Refills  . RABEprazole (ACIPHEX) 20 MG tablet [Pharmacy Med Name: RABEPRAZOLE SOD DR 20 MG TAB] 180 tablet 3    Sig: TAKE 1 TABLET BY MOUTH TWICE A DAY     Gastroenterology: Proton Pump Inhibitors Passed - 06/28/2022  8:37 AM      Passed - Valid encounter within last 12 months    Recent Outpatient Visits          1 week ago Intractable chronic migraine without aura and without status migrainosus   [facility], [name], NP   1 month ago Essential hypertension   [place], RPH-CPP   1 month ago Gastroesophageal reflux disease without esophagitis   [facility] [place], [name], NP   2 months ago Community acquired pneumonia of right lower lobe of lung   [facility] [place], [place], NP   2 months ago Acute non-recurrent frontal sinusitis   [place], DO      Future Appointments            In 4 days Hilty, Nadean Corwin, MD [facility], [place]   In 1 month Ellyn Hack, Leonie Green, MD [place]. [place]   In 4 months [name], [name], NP [facility], [place]   In 10 months Diamantina Providence, Herbert Seta, MD [place]

## 2022-06-29 NOTE — Telephone Encounter (Signed)
Requested Prescriptions  Pending Prescriptions Disp Refills  . escitalopram (LEXAPRO) 20 MG tablet [Pharmacy Med Name: ESCITALOPRAM 20 MG TABLET] 90 tablet 0    Sig: TAKE 1 TABLET BY MOUTH EVERY DAY     Psychiatry:  Antidepressants - SSRI Passed - 06/27/2022  9:16 AM      Passed - Completed PHQ-2 or PHQ-9 in the last 360 days      Passed - Valid encounter within last 6 months    Recent Outpatient Visits          1 week ago Intractable chronic migraine without aura and without status migrainosus   [facility] [place], [name], NP   1 month ago Essential hypertension   [place], [place] A, RPH-CPP   1 month ago Gastroesophageal reflux disease without esophagitis   [facility] [place], [name], NP   2 months ago Community acquired pneumonia of right lower lobe of lung   [facility] [place], [place], NP   2 months ago Acute non-recurrent frontal sinusitis   [place], DO      Future Appointments            In 4 days Hilty, Nadean Corwin, MD [facility], [place]   In 1 month Ellyn Hack, Leonie Green, MD [place]. [place]   In 4 months [name], [name], NP [facility], [place]   In 10 months Diamantina Providence, Herbert Seta, MD [place]

## 2022-06-30 ENCOUNTER — Encounter: Payer: Self-pay | Admitting: Student in an Organized Health Care Education/Training Program

## 2022-06-30 ENCOUNTER — Telehealth: Payer: Self-pay | Admitting: *Deleted

## 2022-06-30 ENCOUNTER — Encounter: Payer: Self-pay | Admitting: Gastroenterology

## 2022-06-30 DIAGNOSIS — G894 Chronic pain syndrome: Secondary | ICD-10-CM

## 2022-06-30 DIAGNOSIS — M47816 Spondylosis without myelopathy or radiculopathy, lumbar region: Secondary | ICD-10-CM

## 2022-06-30 MED ORDER — HYDROCODONE-ACETAMINOPHEN 7.5-325 MG PO TABS: 1.0000 | ORAL_TABLET | Freq: Two times a day (BID) | ORAL | 0 refills | Status: DC | PRN

## 2022-06-30 NOTE — Telephone Encounter (Signed)
Script for Hydrocodone 10/325 mg to be filled 06-12-22 cancelled.

## 2022-06-30 NOTE — Telephone Encounter (Signed)
Requested Prescriptions   Signed Prescriptions Disp Refills   HYDROcodone-acetaminophen (NORCO) 7.5-325 MG tablet 60 tablet 0    Sig: Take 1 tablet by mouth every 12 (twelve) hours as needed for severe pain. Must last 30 days.    Authorizing Provider: [name]     Please cancel 10 mg prescription.

## 2022-06-30 NOTE — Telephone Encounter (Signed)
Sounds reasonable.  Can take MiraLAX 1-3 times a day or as needed.

## 2022-07-02 MED ORDER — PROPRANOLOL HCL ER 60 MG PO CP24: 60.0000 mg | ORAL_CAPSULE | Freq: Two times a day (BID) | ORAL | 0 refills | Status: DC

## 2022-07-03 ENCOUNTER — Telehealth: Payer: Self-pay | Admitting: Internal Medicine

## 2022-07-03 ENCOUNTER — Encounter (HOSPITAL_BASED_OUTPATIENT_CLINIC_OR_DEPARTMENT_OTHER): Payer: Self-pay | Admitting: Internal Medicine

## 2022-07-03 ENCOUNTER — Ambulatory Visit (INDEPENDENT_AMBULATORY_CARE_PROVIDER_SITE_OTHER): Payer: Medicare Other | Admitting: Internal Medicine

## 2022-07-03 VITALS — BP 153/87 | HR 55 | Ht 69.0 in | Wt 140.9 lb

## 2022-07-03 DIAGNOSIS — I249 Acute ischemic heart disease, unspecified: Secondary | ICD-10-CM

## 2022-07-03 DIAGNOSIS — E785 Hyperlipidemia, unspecified: Secondary | ICD-10-CM

## 2022-07-03 DIAGNOSIS — I2510 Atherosclerotic heart disease of native coronary artery without angina pectoris: Secondary | ICD-10-CM | POA: Diagnosis not present

## 2022-07-03 DIAGNOSIS — Z789 Other specified health status: Secondary | ICD-10-CM | POA: Diagnosis not present

## 2022-07-03 DIAGNOSIS — I10 Essential (primary) hypertension: Secondary | ICD-10-CM

## 2022-07-03 NOTE — Patient Instructions (Signed)
Medication Instructions:  Dr. Debara Pickett recommends Repatha or Praluent (PCSK9). This is an injectable cholesterol medication self-administered once every 14 days. This medication will likely need prior approval with your insurance company, which we will work on. If the medication is not approved initially, we may need to do an appeal with your insurance.   Administer medication in area of fatty tissue such as abdomen, outer thigh, back of upper arm - and rotate site with each injection Store medication in refrigerator until ready to administer - allow to sit at room temp for 30 mins - 1 hour prior to injection Dispose of medication in a SHARPS container - your pharmacy should be able to direct you on this and proper disposal   If you need a co-pay card for Repatha: MicroLists.at If you need a co-pay card for Praluent: https://praluentpatientsupport.KnowRentals.uy  Patient Assistance:    These foundations have funds at various times.   The [employer] Foundation: https://www.panfoundation.org/disease-funds/hypercholesterolemia/ -- can sign up for wait list  The [facility] offers assistance to help pay for medication copays.  They will cover copays for all cholesterol lowering meds, including statins, fibrates, omega-3 fish oils like Vascepa, ezetimibe, Repatha, Praluent, Nexletol, Nexlizet.  The cards are usually good for $2,500 or 12 months, whichever comes first. Go to healthwellfoundation.org Click on "Apply Now" Answer questions as to whom is applying (patient or representative) Your disease fund will be "hypercholesterolemia - Medicare access" They will ask questions about finances and which medications you are taking for cholesterol When you submit, the approval is usually within minutes.  You will need to print the card information from the site You will need to show this information to your pharmacy, they will bill your Medicare Part D plan first -then bill Health  Well --for the copay.   You can also call them at [phone], although the hold times can be quite long.     *If you need a refill on your cardiac medications before your next appointment, please call your pharmacy*   Lab Work: FASTING lab work to check cholesterol in 3-4 months  If you have labs (blood work) drawn today and your tests are completely normal, you will receive your results only by: [place] (if you have MyChart) OR A paper copy in the mail If you have any lab test that is abnormal or we need to change your treatment, we will call you to review the results.   Testing/Procedures: NONE   Follow-Up: At [facility], you and your health needs are our priority.  As part of our continuing mission to provide you with exceptional heart care, we have created designated Provider Care Teams.  These Care Teams include your primary Cardiologist (physician) and Advanced Practice Providers (APPs -  Physician Assistants and Nurse Practitioners) who all work together to provide you with the care you need, when you need it.  We recommend signing up for the patient portal called "MyChart".  Sign up information is provided on this After Visit Summary.  MyChart is used to connect with patients for Virtual Visits (Telemedicine).  Patients are able to view lab/test results, encounter notes, upcoming appointments, etc.  Non-urgent messages can be sent to your provider as well.   To learn more about what you can do with MyChart, go to [email or web address].    Your next appointment:   3-4 months with Dr. Debara Pickett -- lipid clinic

## 2022-07-03 NOTE — Progress Notes (Signed)
LIPID CLINIC CONSULT NOTE  Chief Complaint:  Manage dyslipidemia  Primary Care Physician: [name], NP  Primary Cardiologist:  None  HPI:  Grant Young is a 76 y.o. male who is being seen today for the evaluation of dyslipidemia at the request of Rise Mu, PA-C.  This is a pleasant 76 year old male with a history of coronary artery disease and recent PCI x2 in March 2023.  He has been on some statin therapy but has had side effects, particularly on simvastatin and more recently on atorvastatin.  He has been able to take Zetia and remains on this.  He also has a long list of other medication intolerances.  Recent labs showed total cholesterol of 160, triglycerides 74, HDL 47 and LDL 99 however he was advised to stop atorvastatin after these labs due to fatigue and other intolerances.  He was referred for evaluation of possible PCSK9 inhibitor therapy.  PMHx:  Past Medical History:  Diagnosis Date   Anxiety    Per New Patient Packet   Chronic back pain    Per New Patient Packet   Chronic heart disease    Per New Patient Packet   Depression    GERD (gastroesophageal reflux disease)    Headache    History of gastritis    Per New Patient Packet   History of neuropathy    Per New Patient Packet   Hypertension    Per New Patient Packet   Insomnia    Kidney stone    Lumbar radicular pain    Per New Patient Packet   Lyme disease    Per New Patient Packet   Myocardial infarction [facility])    Overactive bladder    PONV (postoperative nausea and vomiting)    Skin cancer, basal cell    Spinal cord stimulator status    01/08/21 - not currently using.   Squamous cell skin cancer    Substance abuse [facility])     Past Surgical History:  Procedure Laterality Date   BACK SURGERY     CARDIAC CATHETERIZATION     CATARACT EXTRACTION W/PHACO Left 01/14/2021   Procedure: CATARACT EXTRACTION PHACO AND INTRAOCULAR LENS PLACEMENT (IOC) LEFT VIVITY TORIC LENS 8.75 00:56.7;  Surgeon:  Birder Robson, MD;  Location: [place];  Service: Ophthalmology;  Laterality: Left;   CATARACT EXTRACTION W/PHACO Right 01/28/2021   Procedure: CATARACT EXTRACTION PHACO AND INTRAOCULAR LENS PLACEMENT ([place]) RIGHT VIVITY TORIC LENS;  Surgeon: Birder Robson, MD;  Location: [place];  Service: Ophthalmology;  Laterality: Right;  6.54 00:46.4   CHOLECYSTECTOMY  2017   COLONOSCOPY  09/15/2015   Per New Patient Packet   COLONOSCOPY WITH PROPOFOL N/A 10/03/2020   Procedure: COLONOSCOPY WITH PROPOFOL;  Surgeon: [name], MD;  Location: [facility] ENDOSCOPY;  Service: Gastroenterology;  Laterality: N/A;   CORONARY/GRAFT ACUTE MI REVASCULARIZATION N/A 11/27/2021   Procedure: Coronary/Graft Acute MI Revascularization;  Surgeon: Leonie Man, MD;  Location: [place] CV LAB;  Service: Cardiovascular;  Laterality: N/A;   ESOPHAGOGASTRODUODENOSCOPY (EGD) WITH PROPOFOL N/A 10/03/2020   Procedure: ESOPHAGOGASTRODUODENOSCOPY (EGD) WITH PROPOFOL;  Surgeon: [name], MD;  Location: [facility] ENDOSCOPY;  Service: Gastroenterology;  Laterality: N/A;   FOOT SURGERY Bilateral 03/18/2020   FOOT SURGERY  08/032021   GALLBLADDER SURGERY  09/15/2015   Gallbladder Removal. Procedure done by Dr.Beverly. Per New Patient Packet   KIDNEY STONE SURGERY  09/14/1980   Too many to count. Per New Patient Packet 09/14/1980-09/15/1995   KIDNEY  STONE SURGERY  09/15/1995   Too many to count. Per New Patient Packet   LEFT HEART CATH AND CORONARY ANGIOGRAPHY N/A 11/27/2021   Procedure: LEFT HEART CATH AND CORONARY ANGIOGRAPHY;  Surgeon: Leonie Man, MD;  Location: [place] CV LAB;  Service: Cardiovascular;  Laterality: N/A;   LITHOTRIPSY  09/14/2014   Per New Patient Packet   LITHOTRIPSY  09/14/1996   No Stints Used. Per New Patient Packet   LITHOTRIPSY  09/14/1997   No Stints used. Per New Patient Packet   PAIN PUMP IMPLANTATION     PAIN PUMP REMOVAL     SHOULDER SURGERY Right 1984    SIGMOIDOSCOPY  09/14/2017   Per New Patient Packet   SPINAL CORD STIMULATOR IMPLANT     TONSILLECTOMY     UPPER GI ENDOSCOPY      FAMHx:  Family History  Problem Relation Age of Onset   Heart disease Father    Heart failure Father    Hypertension Father    Stroke Father    Stroke Mother    Dementia Mother    Kidney Stones Daughter    Anxiety disorder Daughter    OCD Daughter     SOCHx:   reports that he has never smoked. He has never been exposed to tobacco smoke. He has never used smokeless tobacco. He reports current alcohol use. He reports that he does not currently use drugs.  ALLERGIES:  Allergies  Allergen Reactions   Ace Inhibitors     Other reaction(s): Cough   Fluoxetine Anxiety    "made me fall asleep" per pt "bad headaches and "makes  Me  Crazy" historical allergy noted in [employer] "made me fall asleep" per pt "bad headaches and "makes  Me  Crazy" Per New Patient Packet.     Metoclopramide     Other reaction(s): Other (See Comments), Other (See Comments), Unknown Tardive Dyskinesia  historical allergy noted in [employer] Tardive Dyskinesia  Per New Patient Packet.   Nalbuphine     Used Post Back surgery- Anesthesiologist Error. Patient had Narcotic Withdraw. Per New Patient Packet.    Other     Other reaction(s): Other (See Comments) Altered mental status in combo with narcotics at previous hospitalization - Full Withdrawal Symptoms Other reaction(s): Rash   Amoxicillin-Pot Clavulanate Nausea Only    Per New Patient Packet.   Doxazosin Rash    Other reaction(s): Other - See Comments, Rash UNKNOWN REACTION UNKNOWN REACTION    Duloxetine Nausea Only    Per New Patient Packet.    Penicillins Nausea Only    Per New Patient Packet.   Tamsulosin Itching and Anxiety    Restless, Flushing, Heavy Chest, Itching, Hyperactive mood and Anxiety. Unable to handle side effects. Per New Patient Packet.     Trazodone And Nefazodone Itching, Anxiety and Rash     Headache. "INCREASED MY ANXIETY AND HEARTRATE" Flushing, tachycardia "INCREASED MY ANXIETY AND HEARTRATE" Per New Patient Packet.    Amlodipine     Shaking, unsure of reaction. Per New Patient Packet.     Cinoxacin     GI Intolerance, and Dizziness. Per New Patient Packet.    Ciprofloxacin     Other reaction(s): Unknown   Fludrocortisone Other (See Comments)    "Worsening headaches, GI issues, fatigue"   Nebivolol     Other reaction(s): Unknown   Olanzapine     Headache and unable to sleep for 3 nights. Per New Patient Packet.    Olmesartan  Other reaction(s): Unknown   Pregabalin     Confusion, Lack of concentration, dizziness, and likely drowsiness. Per New Patient Packet.     Prostaglandins     Other reaction(s): Other (See Comments) Intolerance   Thyroid Hormones     Other reaction(s): Other (See Comments) Thyroid (Nature Thyroid) contraindicated with some of your other medications.   Zolpidem     Nightmares, Ineffective after 2 days. Per New Patient Packet.    Duloxetine Hcl     Other reaction(s): Rash   Fluoxetine Hcl     Other reaction(s): Rash   Nucynta [Tapentadol] Other (See Comments)    Vertigo    Phenytoin Anxiety    Hyperactivity, and Ineffective. Per New Patient Packet.     ROS: Pertinent items noted in HPI and remainder of comprehensive ROS otherwise negative.  HOME MEDS: Current Outpatient Medications on File Prior to Visit  Medication Sig Dispense Refill   anastrozole (ARIMIDEX) 1 MG tablet 1/2 tablet (0.5 mg) by mouth once weekly     aspirin 81 MG chewable tablet Chew 1 tablet (81 mg total) by mouth daily. 90 tablet 3   butalbital-acetaminophen-caffeine (FIORICET) 50-325-40 MG tablet Take 1 tablet by mouth every 6 (six) hours as needed for headache. 90 tablet 0   cetirizine (ZYRTEC) 10 MG tablet Take 10 mg by mouth daily as needed for allergies.     chlorthalidone (HYGROTON) 25 MG tablet Take 1 tablet by mouth daily.     Cholecalciferol 50  MCG (2000 UT) CAPS Take 1 tablet by mouth daily.     clopidogrel (PLAVIX) 75 MG tablet Take 1 tablet (75 mg) by mouth once daily 90 tablet 2   Cyanocobalamin (VITAMIN B-12) 5000 MCG LOZG Take 1 lozenge by mouth daily.      DHEA 25 MG CAPS Take 25 mg by mouth daily.     diazepam (VALIUM) 5 MG tablet TAKE 1 TABLET IN THE MORNING AND 2 TABLETS AT BEDTIME 90 tablet 0   dicyclomine (BENTYL) 10 MG capsule Take 1 capsule (10 mg total) by mouth 4 (four) times daily -  before meals and at bedtime. 90 capsule 1   Digestive Aids Mixture (DIGESTION GB PO) Take 1 capsule by mouth daily.     escitalopram (LEXAPRO) 20 MG tablet TAKE 1 TABLET BY MOUTH EVERY DAY 90 tablet 0   ezetimibe (ZETIA) 10 MG tablet Take 10 mg by mouth daily.     HYDROcodone-acetaminophen (NORCO) 7.5-325 MG tablet Take 1 tablet by mouth every 12 (twelve) hours as needed for severe pain. Must last 30 days. 60 tablet 0   ipratropium (ATROVENT) 0.06 % nasal spray Place 1 spray into both nostrils 4 (four) times daily as needed for rhinitis. For up to 5-7 days then stop. 15 mL 0   Loperamide HCl (IMODIUM PO) Take by mouth as needed.      MAGNESIUM BISGLYCINATE PO Take by mouth daily.     melatonin 5 MG TABS Take 5 mg by mouth at bedtime.     NONFORMULARY OR COMPOUNDED ITEM Bi-Mix Papaverine '30mg'$ , Phentolamine '1mg'$    Dosage: Inject 1cc per injection    Vial 39m   Qty #5 Refills 6 5 each 6   nortriptyline (PAMELOR) 10 MG capsule Take 1 capsule (10 mg total) by mouth at bedtime. 90 capsule 0   Nutritional Supplements (NUTRITIONAL SUPPLEMENT PO) Take 1 capsule by mouth daily. [name]     ondansetron (ZOFRAN) 4 MG tablet Take by mouth.  polyethylene glycol (MIRALAX / GLYCOLAX) 17 g packet Take 17 g by mouth daily as needed.     Probiotic Product (PROBIOTIC DAILY PO) Take by mouth daily.     propranolol ER (INDERAL LA) 60 MG 24 hr capsule Take 1 capsule (60 mg total) by mouth in the morning and at bedtime. (Patient taking  differently: Take 120 mg by mouth in the morning and at bedtime.) 180 capsule 0   pyridostigmine (MESTINON) 60 MG tablet Take 0.5 tablets (30 mg total) by mouth 2 (two) times daily. 90 tablet 0   QULIPTA 30 MG TABS Take 1 tablet by mouth daily.     RABEprazole (ACIPHEX) 20 MG tablet TAKE 1 TABLET BY MOUTH TWICE A DAY 180 tablet 3   sucralfate (CARAFATE) 1 g tablet Take 1 g by mouth 4 (four) times daily as needed.      testosterone cypionate (DEPOTESTOSTERONE CYPIONATE) 200 MG/ML injection Inject 80 mg into the muscle every 14 (fourteen) days.     valACYclovir (VALTREX) 1000 MG tablet Take 2 tabs p.o. and repeat in 12 hours as needed for cold sore 30 tablet 0   Vibegron (GEMTESA) 75 MG TABS Take 75 mg by mouth daily. 90 tablet 3   No current facility-administered medications on file prior to visit.    LABS/IMAGING: No results found for this or any previous visit (from the past 48 hour(s)). No results found.  LIPID PANEL:    Component Value Date/Time   CHOL 170 11/11/2021 1006   TRIG 99 11/11/2021 1006   HDL 48 11/11/2021 1006   CHOLHDL 3.5 11/11/2021 1006   LDLCALC 103 (H) 11/11/2021 1006   LDLDIRECT 98.5 01/30/2022 1535    WEIGHTS: Wt Readings from Last 3 Encounters:  07/03/22 140 lb 14.4 oz (63.9 kg)  06/18/22 134 lb (60.8 kg)  06/16/22 135 lb 12.8 oz (61.6 kg)    VITALS: BP (!) 153/87 (BP Location: Left Arm, Patient Position: Sitting, Cuff Size: Normal)   Pulse (!) 55   Ht '5\' 9"'$  (1.753 m)   Wt 140 lb 14.4 oz (63.9 kg)   SpO2 96%   BMI 20.81 kg/m   EXAM: Deferred  EKG: Deferred  ASSESSMENT: Coronary artery disease with recent PCI Mixed dyslipidemia, goal LDL less than 70 Statin intolerant  PLAN: 1.   Mr. Girtha Rm has coronary artery disease and had recent PCI.  His target LDL is less than 70.  He has been statin intolerant having had fatigue and vague symptoms but is able to take ezetimibe.  His recent lipid profile even on 80 mg of atorvastatin and 10 mg of  ezetimibe was still elevated above target.  He has subsequently stopped his atorvastatin and 1 would expect his cholesterol to go up another 50%.  He will need significant lipid lowering and is a good candidate for PCSK9 inhibitor.  Would recommend prior authorization for this and he is agreeable to the therapy.  Check NMR and LP(a) in about 3 to 4 months after starting therapy.  Thanks for the kind referral.  Pixie Casino, MD, FACC, [place] Director of the [facility]  [facility] Diplomate of the American Board of Clinical Lipidology Attending Cardiologist  Direct Dial: [phone]  Fax: [phone]  Website:  [email or web address] 07/03/2022, 4:35 PM

## 2022-07-03 NOTE — Telephone Encounter (Signed)
Patient needs PA for PCSK9i Routed to PharmD team

## 2022-07-04 ENCOUNTER — Other Ambulatory Visit: Payer: Self-pay | Admitting: Internal Medicine

## 2022-07-06 NOTE — Telephone Encounter (Signed)
Requested medication (s) are due for refill today: Compazine is not, Fioricet is  Requested medication (s) are on the active medication list: Compazine is not, Fioricet is  Last refill:  Fioricet 03/13/22 #90 with 0 RF  Future visit scheduled: 11/20/2022, seen 06/18/22  Notes to clinic:  Compazine not on current med list but not delegated. Fioricet not delegated, please assess.      Requested Prescriptions  Pending Prescriptions Disp Refills   prochlorperazine (COMPAZINE) 10 MG tablet [Pharmacy Med Name: PROCHLORPERAZINE 10 MG TAB] 90 tablet 0    Sig: TAKE 1 TABLET (10 MG TOTAL) BY MOUTH EVERY 8 (EIGHT) HOURS AS NEEDED FOR NAUSEA OR VOMITING. TAKE 1 TABLET (10 MG TOTAL) BY MOUTH EVERY 6 (SIX) HOURS AS NEEDED FOR HEADACHE AND NAUSEA, LIMIT USE TO 3 DAYS PER WEEK     Not Delegated - Gastroenterology: Antiemetics Failed - 07/04/2022 11:26 AM      Failed - This refill cannot be delegated      Passed - Valid encounter within last 6 months    Recent Outpatient Visits           2 weeks ago Intractable chronic migraine without aura and without status migrainosus   [facility] [place], [name], NP   1 month ago Essential hypertension   [place], Grayland Ormond A, RPH-CPP   1 month ago Gastroesophageal reflux disease without esophagitis   [facility] [place], [name], NP   2 months ago Community acquired pneumonia of right lower lobe of lung   [facility] [place], [place], NP   3 months ago Acute non-recurrent frontal sinusitis   [place], Devonne Doughty, DO       Future Appointments             In 1 month Ellyn Hack, Leonie Green, MD [place]. [place]   In 3 months Hilty, Nadean Corwin, MD [facility], [facility]   In 4 months [name], [name], NP [facility], [place]   In 10 months Diamantina Providence, Herbert Seta, MD [place]             butalbital-acetaminophen-caffeine (FIORICET) [phone] MG tablet [Pharmacy Med Name: BUTALB-ACETAMIN-CAFF 50-325-40] 90 tablet     Sig: TAKE 1 TABLET BY MOUTH EVERY 6 HOURS AS NEEDED FOR HEADACHE.     Not Delegated - Analgesics:  Non-Opioid Analgesic Combinations 2 Failed - 07/04/2022 11:26 AM      Failed - This refill cannot be delegated      Passed - Cr in normal range and within 360 days    Creatinine, Ser  Date Value Ref Range Status  04/20/2022 0.68 0.61 - 1.24 mg/dL Final         Passed - eGFR is 10 or above and within 360 days    GFR calc Af Amer  Date Value Ref Range Status  05/20/2020 101  Final   GFR, Estimated  Date Value Ref Range Status  04/20/2022 >60 >60 mL/min Final    Comment:    (NOTE) Calculated using the CKD-EPI Creatinine Equation (2021)    eGFR  Date Value Ref Range Status  12/26/2021 93 >59 mL/min/1.73 Final         Passed - Patient is not pregnant      Passed - Valid encounter within last 12 months    Recent Outpatient Visits  2 weeks ago Intractable chronic migraine without aura and without status migrainosus   [facility] [place], [name], NP   1 month ago Essential hypertension   [place], Grayland Ormond A, RPH-CPP   1 month ago Gastroesophageal reflux disease without esophagitis   [facility] [place], [name], NP   2 months ago Community acquired pneumonia of right lower lobe of lung   [facility] [place], [place], NP   3 months ago Acute non-recurrent frontal sinusitis   [facility] Olin Hauser, DO       Future Appointments             In 1 month Ellyn Hack, Leonie Green, MD [place]. [place]   In 3 months Hilty, Nadean Corwin, MD [facility], [facility]   In 4 months [name], [name], NP [facility], [place]   In 10 months Diamantina Providence, Herbert Seta, MD [place]

## 2022-07-07 ENCOUNTER — Telehealth: Payer: Self-pay | Admitting: *Deleted

## 2022-07-07 MED ORDER — PROPRANOLOL HCL ER 60 MG PO CP24: 60.0000 mg | ORAL_CAPSULE | Freq: Two times a day (BID) | ORAL | 0 refills | Status: DC

## 2022-07-07 MED ORDER — DIAZEPAM 5 MG PO TABS: ORAL_TABLET | ORAL | 0 refills | Status: DC

## 2022-07-07 MED ORDER — PROCHLORPERAZINE MALEATE 10 MG PO TABS: 10.0000 mg | ORAL_TABLET | Freq: Two times a day (BID) | ORAL | 0 refills | Status: DC | PRN

## 2022-07-07 NOTE — Telephone Encounter (Signed)
Patient is calling with request for medication refills-  Diazepam 5 mg and compazine 10 mg. Patient states PCP is supposed to take over Rx for compazine 10 mg #90- he takes 1-2 per day if needed and none if he feels he does not need. He states previous provider for this was Rico Junker and he does not see them anymore.  Patient advised both of these Rx are controlled Rx and I will send request for refill to provider. Office will call him if needed. [phone]  Patient also states the pharmacy did not give him #180 of Propranolol 60 mg- In review Rx was marked as no print and per pharmacy- they did no receive Rx for that date- please correct and send ( I did call patient and left message will have that Rx resent to pharmacy).

## 2022-07-07 NOTE — Addendum Note (Signed)
Addended by: [name] on: 07/07/2022 01:56 PM   Modules accepted: Orders

## 2022-07-08 NOTE — Telephone Encounter (Signed)
Patient has two insurances on file with [facility]. Eligibility could not be found on either plan.  Unclear if patient has prescription drug coverage.

## 2022-07-09 ENCOUNTER — Telehealth: Payer: Self-pay | Admitting: Pulmonary Disease

## 2022-07-09 NOTE — Telephone Encounter (Signed)
Routed to PharmD clinic to address PA request

## 2022-07-09 NOTE — Telephone Encounter (Signed)
So there is no number to call [facility] back in reference to this encounter. But Im going to try to determine information.   Dr Halford Chessman did you order a procedure or anything of that nature to be done for this patient?  Please advise sir

## 2022-07-09 NOTE — Telephone Encounter (Signed)
PA submitted. Came back The patient currently has access to the requested medication and a Prior Authorization is not needed for the patient/medication. Key: [id number]  Patient has the [place] state retirement plan. I do think he will own 25% of cost- ~$150, but could be wrong Called pt and let him know of the approval. Advised to call if any issues.

## 2022-07-10 MED ORDER — REPATHA SURECLICK 140 MG/ML ~~LOC~~ SOAJ: 1.0000 mL | SUBCUTANEOUS | 11 refills | Status: DC

## 2022-07-10 NOTE — Telephone Encounter (Signed)
I did not order anything.  He was seen by Dr. Lanney Gins with [facility] pulmonary and had PET scan done on 06/05/22.  I suspect this is what the call was in reference to.

## 2022-07-10 NOTE — Telephone Encounter (Signed)
Unsure what message is referring to. Called phone number in encounter which did not lead to pt nor [place] clinic. Appears CT was ordered yesterday by [facility] clinic. Encounter will be closed as order was not placed by our providers.

## 2022-07-13 ENCOUNTER — Ambulatory Visit: Payer: Medicare Other | Admitting: Pharmacist

## 2022-07-13 ENCOUNTER — Encounter: Payer: Self-pay | Admitting: Internal Medicine

## 2022-07-13 DIAGNOSIS — I10 Essential (primary) hypertension: Secondary | ICD-10-CM

## 2022-07-13 MED ORDER — EZETIMIBE 10 MG PO TABS: 10.0000 mg | ORAL_TABLET | Freq: Every day | ORAL | 1 refills | Status: DC

## 2022-07-13 NOTE — Patient Instructions (Signed)
Goals Addressed             This Visit's Progress    Pharmacy Goals       Please check your home blood pressure, keep a log of the results and bring this with you to your medical appointments. Please call office sooner for readings outside of established parameters or symptoms.   Feel free to call me with any questions or concerns. I look forward to our next call!  Dhanvin Szeto Kien Mirsky, PharmD, BCACP Clinical Pharmacist [facility]  [phone]        

## 2022-07-13 NOTE — Chronic Care Management (AMB) (Signed)
07/13/2022 Name: Grant Young MRN: [id number] DOB: 1946-07-17  Chief Complaint  Patient presents with   Medication Management    Grant Young is a 76 y.o. year old male who presented for a telephone visit.   They were referred to the pharmacist by their PCP for assistance in managing hypertension and complex medication management.    Subjective:  Care Team: Primary Care Provider: [name], NP ; Next Scheduled Visit: 11/20/2022 Cardiologist: Leonie Man, MD; Next Scheduled Visit: 08/13/2022 Neurologist: Rufina Falco, Azure; Next Scheduled Visit: 10/06/2022 Pain Management: [name], MD; Next Scheduled Visit: 07/30/2022 Cardiology-[facility] Specialist: Pixie Casino, MD; Next Scheduled Visit: 10/19/2022 Urologist: Billey Co, MD; Next Schedule Visit: 05/19/2023   Perform chart review.  Patient seen for Office Visit with [facility] Neurology on 10/24. Provider advised patient: - Check iron panel today. - Increase Qulipta to 60 mg once daily. - Continue Fioricet as needed.  - Continue nortriptyline '10mg'$  once daily. Can consider increasing this for headaches and neuropathy. - Can consider repeat occipital nerve block pending response to medications.  - Continue propranolol as directed by primary care. Continue to monitor blood pressures at home.  Office Visit with PCP on 10/5 Increase propranolol to 60 mg twice daily, continue chlorthalidone Will start Nortriptyline, he will follow-up with neurology to see if they want him to restart Lenoria Chime Advised him to monitor blood pressures, heart rates, migraines and neuropathic symptoms and update me in 1 month   Today reports addition of nortriptyline helping with his sleep and leg pain. Reports with starting nortriptyline, has self-tapered down diazepam 5 mg to  tablet (2.5 mg) in AM and 1 tablet (5 mg) QHS and hoping to slowly come off to possibly improve balance    Medication  Access/Adherence  Current Pharmacy:  [id number] [place] [address]Phone: [phone]: [address] 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) Reports self-discontinued chlorthalidone 25 mg daily on 07/09/2022 related to urinary incontinence side effect (feels like working against his [employer] centre [employer]  Medications previously tried: labetalol, amlodipine, olmesartan  Patient has an automated, upper arm home BP cuff Current blood pressure readings readings:  10/21 - 127/71, HR 51 10/22 - 133/69, HR 53 10/23 - 115/74, HR 55 10/24 - 118/75, HR 58 10/25 - 105/66, HR 62; 108/66, HR 59 10/26 - 121/81, HR 69  10/27 - 131/78, HR 64 10/28 - 131/76, HR 59 10/29 - 115/68, HR 64 10/30 - 150/80, HR 49   Attributes variation in readings both at home and vs in office to stress/being in office (being asked questions/busy environment)  Reports planning to continue to monitor home blood pressure closely and see if readings remain controlled with having self-decreased chlorthalidone   Health Maintenance  Health Maintenance Due  Topic Date Due   COVID-19 Vaccine (4 - Moderna risk series) 09/24/2020     Objective: Lab Results  Component Value Date   HGBA1C 5.1 10/17/2019  Lab Results  Component Value Date   CREATININE 0.68 04/20/2022   BUN 14 04/20/2022   NA 143 04/20/2022   K 3.5 04/20/2022   CL 108 04/20/2022   CO2  26 04/20/2022    Lab Results  Component Value Date   CHOL 170 11/11/2021   HDL 48 11/11/2021   LDLCALC 103 (H) 11/11/2021   LDLDIRECT 98.5 01/30/2022   TRIG 99 11/11/2021   CHOLHDL 3.5 11/11/2021    Medications Reviewed Today     Reviewed by Rennis Petty, RPH-CPP (Pharmacist) on 07/13/22 at 1401  Med List Status: <None>   Medication Order Taking? Sig Documenting Provider Last Dose Status Informant  anastrozole (ARIMIDEX) 1 MG tablet [id number]  1/2 tablet (0.5 mg) by mouth once weekly [provider]  Active Spouse/Significant Other, Self  aspirin 81 MG chewable tablet [id number]  Chew 1 tablet (81 mg total) by mouth daily. Val Riles, MD  Active Self, Spouse/Significant Other  Atogepant 60 MG TABS [id number] Yes Take 60 mg by mouth once daily [provider] Taking Active   butalbital-acetaminophen-caffeine (FIORICET) 50-325-40 MG tablet [id number]  TAKE 1 TABLET BY MOUTH EVERY 6 HOURS AS NEEDED FOR HEADACHE. [name], NP  Active   cetirizine (ZYRTEC) 10 MG tablet [id number]  Take 10 mg by mouth daily as needed for allergies. [provider]  Active Spouse/Significant Other, Self  chlorthalidone (HYGROTON) 25 MG tablet [id number] No Take 1 tablet by mouth daily.  Patient not taking: Reported on 07/13/2022   [provider] Not Taking Active   Cholecalciferol 50 MCG (2000 UT) CAPS [id number]  Take 1 tablet by mouth daily. [provider]  Active Spouse/Significant Other, Self  clopidogrel (PLAVIX) 75 MG tablet [id number]  Take 1 tablet (75 mg) by mouth once daily Leonie Man, MD  Active Self, Spouse/Significant Other  Cyanocobalamin (VITAMIN B-12) 5000 MCG LOZG [id number]  Take 1 lozenge by mouth daily.  [provider]  Active Spouse/Significant Other, Self  DHEA 25 MG CAPS [id number]  Take 25 mg by mouth daily. [provider]  Active Spouse/Significant Other, Self  diazepam (VALIUM) 5 MG tablet [id number] Yes  TAKE 1 TABLET IN THE MORNING AND 2 TABLETS AT BEDTIME [name], NP Taking Active            Med Note Curley Spice, Nameer Summer A   Mon Jul 13, 2022  1:38 PM) Taking 1/2 tablet in morning and 1 tablet at bedtime  dicyclomine (BENTYL) 10 MG capsule [id number]  Take 1 capsule (10 mg total) by mouth 4 (four) times daily -  before meals and at bedtime. [name], NP  Active   Digestive Aids Mixture (DIGESTION GB PO) [id number]  Take 1 capsule by mouth daily. [provider]  Active Spouse/Significant Other, Self  escitalopram (LEXAPRO) 20 MG tablet [id number]  TAKE 1 TABLET BY MOUTH EVERY DAY [name], [name], NP  Active   Evolocumab (REPATHA SURECLICK) 680 MG/ML SOAJ [id number]  Inject 140 mg into the skin every 14 (fourteen) days. Pixie Casino, MD  Active   ezetimibe (ZETIA) 10 MG tablet [id number]  Take 10 mg by mouth daily. [provider]  Active Self, Spouse/Significant Other  ferrous sulfate 325 (65 FE) MG tablet [id number] Yes Take 325 mg by mouth. Once daily on Mondays, Wednesdays and Fridays [provider] Taking Active   HYDROcodone-acetaminophen (NORCO) 7.5-325 MG tablet [id number]  Take 1 tablet by mouth every 12 (twelve) hours as needed for severe pain. Must  last 30 days. [name], MD  Active   ipratropium (ATROVENT) 0.06 % nasal spray [id number]  Place 1 spray into both nostrils 4 (four) times daily as needed for rhinitis. For up to 5-7 days then stop. Olin Hauser, DO  Active Self, Spouse/Significant Other  Loperamide HCl (IMODIUM PO) [id number]  Take by mouth as needed.  [provider]  Active Spouse/Significant Other, Self  MAGNESIUM BISGLYCINATE PO [id number]  Take by mouth daily. [provider]  Active Spouse/Significant Other, Self  melatonin 5 MG TABS [id number] Yes Take 5 mg by mouth at bedtime. [provider] Taking Active Spouse/Significant Other, Self  NONFORMULARY OR COMPOUNDED ITEM [id number]   Bi-Mix Papaverine '30mg'$ , Phentolamine '1mg'$    Dosage: Inject 1cc per injection    Vial 27m   Qty #5 Refills 6 SBilley Co MD  Active   nortriptyline (PAMELOR) 10 MG capsule [id number] Take 1 capsule (10 mg total) by mouth at bedtime. BJearld Fenton NP Taking Active   Nutritional Supplements (NUTRITIONAL SUPPLEMENT PO) [id number] Take 1 capsule by mouth daily. [name] [provider]  Active Spouse/Significant Other, Self  ondansetron (ZOFRAN) 4 MG tablet [id number] Take by mouth. [provider]  Active   polyethylene glycol (MIRALAX / GLYCOLAX) 17 g packet [id number] Take 17 g by mouth daily as needed. [provider]  Active Spouse/Significant Other, Self  Probiotic Product (PROBIOTIC DAILY PO) [id number] Take by mouth daily. [provider]  Active Spouse/Significant Other, Self  prochlorperazine (COMPAZINE) 10 MG tablet [id number] Take 1 tablet (10 mg total) by mouth 2 (two) times daily as needed for nausea or vomiting. BJearld Fenton NP  Active   propranolol ER (INDERAL LA) 60 MG 24 hr capsule [id number] Take 1 capsule (60 mg total) by mouth in the morning and at bedtime. BJearld Fenton NP Taking Active   pyridostigmine (MESTINON) 60 MG tablet [id number] Take 0.5 tablets (30 mg total) by mouth 2 (two) times daily. BJearld Fenton NP Taking Active   RABEprazole (ACIPHEX) 20 MG tablet [id number] TAKE 1 TABLET BY MOUTH TWICE A DAY [name], RCoralie Keens NP Taking Active   sucralfate (CARAFATE) 1 g tablet [id number] Take 1 g by mouth 4 (four) times daily as needed.  [provider]  Active Spouse/Significant Other, Self  testosterone cypionate (DEPOTESTOSTERONE CYPIONATE) 200 MG/ML injection [id number] Inject 80 mg into the muscle every 14 (fourteen) days. [provider]  Active Spouse/Significant Other, Self  valACYclovir (VALTREX) 1000 MG tablet [id number] Take 2 tabs p.o. and repeat in 12 hours as needed for  cold sore [name], RCoralie Keens NP  Active Spouse/Significant Other, Self  Vibegron (GEMTESA) 75 MG TABS [id number] Take 75 mg by mouth daily. SBilley Co MD  Active   Med List Note (Dewayne Shorter RN 05/26/22 1048): MR 08-11-2022  UDS 09-17-20              Assessment/Plan:  - Have reviewed appropriate home BP monitoring technique (avoid caffeine, smoking, and exercise for 30 minutes before checking, rest for at least 5 minutes before taking BP, sit with feet flat on the floor and back against a hard surface, uncross legs, and rest arm on flat surface) - Reviewed to check blood pressure, document, and provide at next provider visit. Patient to contact PCP or Cardiology sooner if readings outside of established parameters  - Have discussed dietary modifications, such as reduced  salt intake, focus on whole grains, vegetables, lean proteins - Have encouraged patient to consider contacting Pulmonology regarding starting CPAP or referral for oral appliance for sleep apnea - Recommend patient follow up with Neurology regarding concern about tremor Encourage patient to keep log of factors, including medication use and caffeine intake that may impact tremor  Follow Up Plan:   Patient denies further medication questions or concerns today Provide patient with contact information for clinic pharmacist to contact if needed in future for medication questions/concerns   Wallace Cullens, PharmD, [facility] [phone]

## 2022-07-20 ENCOUNTER — Other Ambulatory Visit: Payer: Self-pay | Admitting: Specialist

## 2022-07-20 DIAGNOSIS — R9389 Abnormal findings on diagnostic imaging of other specified body structures: Secondary | ICD-10-CM

## 2022-07-23 ENCOUNTER — Telehealth: Payer: Self-pay | Admitting: Student in an Organized Health Care Education/Training Program

## 2022-07-23 ENCOUNTER — Other Ambulatory Visit: Payer: Self-pay | Admitting: Student in an Organized Health Care Education/Training Program

## 2022-07-23 DIAGNOSIS — M47816 Spondylosis without myelopathy or radiculopathy, lumbar region: Secondary | ICD-10-CM

## 2022-07-23 DIAGNOSIS — M5481 Occipital neuralgia: Secondary | ICD-10-CM

## 2022-07-23 DIAGNOSIS — G894 Chronic pain syndrome: Secondary | ICD-10-CM

## 2022-07-23 DIAGNOSIS — Z9689 Presence of other specified functional implants: Secondary | ICD-10-CM

## 2022-07-23 DIAGNOSIS — M792 Neuralgia and neuritis, unspecified: Secondary | ICD-10-CM

## 2022-07-23 MED ORDER — HYDROCODONE-ACETAMINOPHEN 5-325 MG PO TABS: 2.0000 | ORAL_TABLET | Freq: Two times a day (BID) | ORAL | 0 refills | Status: DC | PRN

## 2022-07-23 NOTE — Progress Notes (Signed)
Patient notified. Per voicemail.

## 2022-07-23 NOTE — Telephone Encounter (Signed)
Asked Dr. Holley Raring about this. Awaiting response.

## 2022-07-23 NOTE — Telephone Encounter (Signed)
Patient called pharmacy to fill script. They do not have the meds in the strength he is written for. They do have the '5mg'$ . He is asking if Dr Holley Raring can change his meds. To take 2 '5mg'$ . Or if a Nurse can find a pharmacy that has the correct dosage and have script sent there. Says this has been done before for him. Wants a nurse to call him

## 2022-07-23 NOTE — Telephone Encounter (Signed)
Patient informed that script for Hydrocodone has been sent to pharmacy. Instructed on how to take the med.

## 2022-07-29 ENCOUNTER — Ambulatory Visit: Payer: Self-pay | Admitting: *Deleted

## 2022-07-29 NOTE — Telephone Encounter (Signed)
Pt's request routed to [employer] for Webb Silversmith, NP.

## 2022-07-29 NOTE — Telephone Encounter (Signed)
Message from [facility] sent at 07/29/2022 10:36 AM EST  Summary: Med dose increase request for travel.   Pt stated is asking, if possible, for his medication nortriptyline (PAMELOR) 10 MG capsule to be doubled as he is going on a trip and is hoping this will help him sleep at night. 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  Pt mentioned he is leaving for his trip this coming Friday.  Pt seeking clinical advice.          Call History   Type Contact Phone/Fax User  07/29/2022 10:35 AM EST Phone ([address]) Grant, Young (Self) [phone] (H) [place], Nelva Bush

## 2022-07-30 ENCOUNTER — Encounter: Payer: Self-pay | Admitting: Student in an Organized Health Care Education/Training Program

## 2022-07-30 ENCOUNTER — Ambulatory Visit
Payer: Worker's Compensation | Attending: Student in an Organized Health Care Education/Training Program | Admitting: Student in an Organized Health Care Education/Training Program

## 2022-07-30 VITALS — BP 143/74 | HR 66 | Temp 97.3°F | Ht 69.0 in | Wt 140.0 lb

## 2022-07-30 DIAGNOSIS — Z9689 Presence of other specified functional implants: Secondary | ICD-10-CM | POA: Insufficient documentation

## 2022-07-30 DIAGNOSIS — M5481 Occipital neuralgia: Secondary | ICD-10-CM | POA: Insufficient documentation

## 2022-07-30 DIAGNOSIS — G894 Chronic pain syndrome: Secondary | ICD-10-CM | POA: Insufficient documentation

## 2022-07-30 DIAGNOSIS — M47816 Spondylosis without myelopathy or radiculopathy, lumbar region: Secondary | ICD-10-CM | POA: Diagnosis present

## 2022-07-30 DIAGNOSIS — M792 Neuralgia and neuritis, unspecified: Secondary | ICD-10-CM | POA: Diagnosis present

## 2022-07-30 DIAGNOSIS — G43701 Chronic migraine without aura, not intractable, with status migrainosus: Secondary | ICD-10-CM | POA: Diagnosis present

## 2022-07-30 MED ORDER — HYDROCODONE-ACETAMINOPHEN 10-325 MG PO TABS: 1.0000 | ORAL_TABLET | Freq: Three times a day (TID) | ORAL | 0 refills | Status: AC | PRN

## 2022-07-30 NOTE — Progress Notes (Signed)
Nursing Pain Medication Assessment:  Safety precautions to be maintained throughout the outpatient stay will include: orient to surroundings, keep bed in low position, maintain call bell within reach at all times, provide assistance with transfer out of bed and ambulation.  Medication Inspection Compliance: Pill count conducted under aseptic conditions, in front of the patient. Neither the pills nor the bottle was removed from the patient's sight at any time. Once count was completed pills were immediately returned to the patient in their original bottle.  Medication: Hydrocodone/APAP Pill/Patch Count:  91 of 120 pills remain Pill/Patch Appearance: Markings consistent with prescribed medication Bottle Appearance: Standard pharmacy container. Clearly labeled. Filled Date: 67 / 9 / 2023 Last Medication intake:  TodaySafety precautions to be maintained throughout the outpatient stay will include: orient to surroundings, keep bed in low position, maintain call bell within reach at all times, provide assistance with transfer out of bed and ambulation.

## 2022-07-30 NOTE — Addendum Note (Signed)
Addended by: Ashley Royalty E on: 07/30/2022 04:08 PM   Modules accepted: Orders

## 2022-07-30 NOTE — Telephone Encounter (Addendum)
Pt advised.  He is going to take 2 tables while on vacation.  He will run out early so he will need a refill sent to [facility].   Thank you,   -Mickel Baas

## 2022-07-30 NOTE — Progress Notes (Signed)
PROVIDER NOTE: Information contained herein reflects review and annotations entered in association with encounter. Interpretation of such information and data should be left to medically-trained personnel. Information provided to patient can be located elsewhere in the medical record under "Patient Instructions". Document created using STT-dictation technology, any transcriptional errors that may result from process are unintentional.    Patient: Grant Young  Service Category: E/M  Provider: [name], MD  DOB: 22-Jan-1946  DOS: 07/30/2022  Specialty: Interventional Pain Management  MRN: [id number]  Setting: Ambulatory outpatient  PCP: [name], NP  Type: Established Patient    Referring Provider: [name], NP  Location: Office  Delivery: Face-to-face     HPI  [name], a 76 y.o. year old male, is here today because of his Chronic pain syndrome [G89.4]. Mr. Clauss primary complain today is Back Pain (lower) Last encounter: My last encounter with him was on 05/26/22 Pertinent problems: Mr. 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 Pain Assessment: Severity of Chronic pain is reported as a 6 /10. Location: Back Right, Left, Lower/Denies. Onset: More than a month ago. Quality: Stabbing, Sharp, Constant, Aching. Timing: Constant. Modifying factor(s): Meds and ice. Vitals:  height is _0  (1.753 m) and weight is 140 lb (63.5 kg). His temperature is 97.3 F (36.3 C) (abnormal). His blood pressure is 143/74 (abnormal) and his pulse is 66. His oxygen saturation is 97%.   Reason for encounter: medication management.   No change in medical history since last visit other than he injured his back doing work.  Likely lumbar myofascial strain. he states that the pain is getting better.  Patient continues multimodal pain regimen as prescribed.  States that it provides pain relief and  improvement in functional status.  He has been having trouble getting his hydrocodone filled at his previous pharmacy.  Pharmacotherapy Assessment  Analgesic: Hydrocodone IR 10 mg BID-TID prn    Monitoring: [place] PMP: PDMP reviewed during this encounter.       Pharmacotherapy: No side-effects or adverse reactions reported. Compliance: No problems identified. Effectiveness: Clinically acceptable.  Chauncey Fischer, RN  07/30/2022 10:51 AM  Sign when Signing Visit Nursing Pain Medication Assessment:  Safety precautions to be maintained throughout the outpatient stay will include: orient to surroundings, keep bed in low position, maintain call bell within reach at all times, provide assistance with transfer out of bed and ambulation.  Medication Inspection Compliance: Pill count conducted under aseptic conditions, in front of the patient. Neither the pills nor the bottle was removed from the patient's sight at any time. Once count was completed pills were immediately returned to the patient in their original bottle.  Medication: Hydrocodone/APAP Pill/Patch Count:  91 of 120 pills remain Pill/Patch Appearance: Markings consistent with prescribed medication Bottle Appearance: Standard pharmacy container. Clearly labeled. Filled Date: 77 / 9 / 2023 Last Medication intake:  TodaySafety precautions to be maintained throughout the outpatient stay will include: orient to surroundings, keep bed in low position, maintain call bell within reach at all times, provide assistance with transfer out of bed and ambulation.      UDS:  Summary  Date Value Ref Range Status  09/17/2020 Note  Final    Comment:    ==================================================================== Compliance Drug Analysis, Ur ==================================================================== Specimen Alert Note: Urinary creatinine is low; ability to detect some drugs may be compromised. Interpret results with caution.  (Creatinine) ==================================================================== Test  Result       Flag       Units  Drug Present and Declared for Prescription Verification   Lorazepam                      1107         EXPECTED   ng/mg creat    Source of lorazepam is a scheduled prescription medication.    Butalbital                     PRESENT      EXPECTED   Citalopram                     PRESENT      EXPECTED   Desmethylcitalopram            PRESENT      EXPECTED    Desmethylcitalopram is an expected metabolite of citalopram or the    enantiomeric form, escitalopram.    Acetaminophen                  PRESENT      EXPECTED   Propranolol                    PRESENT      EXPECTED  Drug Present not Declared for Prescription Verification   Oxazepam                       300          UNEXPECTED ng/mg creat   Temazepam                      357          UNEXPECTED ng/mg creat    Oxazepam and temazepam are expected metabolites of diazepam.    Oxazepam is also an expected metabolite of other benzodiazepine    drugs, including chlordiazepoxide, prazepam, clorazepate, halazepam,    and temazepam.  Oxazepam and temazepam are available as scheduled    prescription medications.  Drug Absent but Declared for Prescription Verification   Hydrocodone                    Not Detected UNEXPECTED ng/mg creat   Tizanidine                     Not Detected UNEXPECTED    Tizanidine, as indicated in the declared medication list, is not    always detected even when used as directed.    Amitriptyline                  Not Detected UNEXPECTED   Prochlorperazine               Not Detected UNEXPECTED   Salicylate                     Not Detected UNEXPECTED    Aspirin, as indicated in the declared medication list, is not always    detected even when used as directed.  ==================================================================== Test                      Result    Flag    Units      Ref Range   Creatinine              14  LL     mg/dL      >=20 ==================================================================== Declared Medications:  The flagging and interpretation on this report are based on the  following declared medications.  Unexpected results may arise from  inaccuracies in the declared medications.   **Note: The testing scope of this panel includes these medications:   Amitriptyline (Elavil)  Butalbital (Fioricet)  Butalbital (Fiorinal)  Escitalopram (Lexapro)  Hydrocodone (Norco)  Lorazepam (Ativan)  Prochlorperazine (Compazine)  Propranolol (Inderal)   **Note: The testing scope of this panel does not include small to  moderate amounts of these reported medications:   Acetaminophen (Fioricet)  Acetaminophen (Norco)  Aspirin (Fiorinal)  Tizanidine (Zanaflex)   **Note: The testing scope of this panel does not include the  following reported medications:   Anastrozole (Arimidex)  Caffeine (Fioricet)  Caffeine (Fiorinal)  Chlorthalidone (Hygroton)  Cholecalciferol  Cholestyramine (Questran)  Dicyclomine (Bentyl)  Ezetimibe (Zetia)  Hydrocortisone  Melatonin  Ondansetron (Zofran)  Potassium (Klor-Con)  Rabeprazole (Aciphex)  Sucralfate (Carafate)  Valacyclovir (Valtrex)  Vitamin B12 ==================================================================== For clinical consultation, please call [phone]. ====================================================================      ROS  Constitutional: Denies any fever or chills Gastrointestinal: No reported hemesis, hematochezia, vomiting, or acute GI distress Musculoskeletal:  Low back pain Neurological: No reported episodes of acute onset apraxia, aphasia, dysarthria, agnosia, amnesia, paralysis, loss of coordination, or loss of consciousness  Medication Review  Atogepant, Cholecalciferol, DHEA, Digestive Aids Mixture, Evolocumab, HYDROcodone-acetaminophen, Loperamide  HCl, Magnesium Bisglycinate, NONFORMULARY OR COMPOUNDED ITEM, Nutritional Supplements, Probiotic Product, RABEprazole, Vibegron, Vitamin B-12, anastrozole, aspirin, butalbital-acetaminophen-caffeine, cetirizine, chlorthalidone, clopidogrel, diazepam, dicyclomine, escitalopram, ezetimibe, ferrous sulfate, ipratropium, melatonin, nortriptyline, ondansetron, polyethylene glycol, prochlorperazine, propranolol ER, pyridostigmine, sucralfate, testosterone cypionate, and valACYclovir  History Review  Allergy: Mr. Brander is allergic to ace inhibitors, fluoxetine, metoclopramide, nalbuphine, other, amoxicillin-pot clavulanate, doxazosin, duloxetine, penicillins, tamsulosin, trazodone and nefazodone, amlodipine, cinoxacin, ciprofloxacin, fludrocortisone, nebivolol, olanzapine, olmesartan, pregabalin, prostaglandins, thyroid hormones, zolpidem, duloxetine hcl, fluoxetine hcl, nucynta [tapentadol], and phenytoin. Drug: Mr. Creque  reports that he does not currently use drugs. Alcohol:  reports current alcohol use. Tobacco:  reports that he has never smoked. He has never been exposed to tobacco smoke. He has never used smokeless tobacco. Social: Mr. Rothert  reports that he has never smoked. He has never been exposed to tobacco smoke. He has never used smokeless tobacco. He reports current alcohol use. He reports that he does not currently use drugs. Medical:  has a past medical history of Anxiety, Chronic back pain, Chronic heart disease, Depression, GERD (gastroesophageal reflux disease), Headache, History of gastritis, History of neuropathy, Hypertension, Insomnia, Kidney stone, Lumbar radicular pain, Lyme disease, Myocardial infarction ([place]), Overactive bladder, PONV (postoperative nausea and vomiting), Skin cancer, basal cell, Spinal cord stimulator status, Squamous cell skin cancer, and Substance abuse ([place]). Surgical: Mr. Bantz  has a past surgical history that includes Kidney stone surgery (09/14/1980); Kidney stone  surgery (09/15/1995); Lithotripsy (09/14/2014); Lithotripsy (09/14/1996); Lithotripsy (09/14/1997); Gallbladder surgery (09/15/2015); Sigmoidoscopy (09/14/2017); Colonoscopy (09/15/2015); Cholecystectomy (2017); Shoulder surgery (Right, 1984); Back surgery; Spinal cord stimulator implant; Pain pump implantation; Pain pump removal; Tonsillectomy; Foot surgery (Bilateral, 03/18/2020); Foot surgery (08/032021); Upper gi endoscopy; Cardiac catheterization; Colonoscopy with propofol (N/A, 10/03/2020); Esophagogastroduodenoscopy (egd) with propofol (N/A, 10/03/2020); Cataract extraction w/PHACO (Left, 01/14/2021); Cataract extraction w/PHACO (Right, 01/28/2021); Coronary/Graft Acute MI Revascularization (N/A, 11/27/2021); and LEFT HEART CATH AND CORONARY ANGIOGRAPHY (N/A, 11/27/2021). Family: family history includes Anxiety disorder in his daughter; Dementia in his mother; Heart disease in his father; Heart failure in his father; Hypertension in  his father; Kidney Stones in his daughter; OCD in his daughter; Stroke in his father and mother.  Laboratory Chemistry Profile   Renal Lab Results  Component Value Date   BUN 14 04/20/2022   CREATININE 0.68 04/20/2022   BCR 17 12/26/2021   GFRAA 101 05/20/2020   GFRNONAA >60 04/20/2022    Hepatic Lab Results  Component Value Date   AST 14 (L) 04/20/2022   ALT 14 04/20/2022   ALBUMIN 3.0 (L) 04/20/2022   ALKPHOS 94 04/20/2022    Electrolytes Lab Results  Component Value Date   NA 143 04/20/2022   K 3.5 04/20/2022   CL 108 04/20/2022   CALCIUM 9.0 04/20/2022   MG 2.0 11/28/2021   PHOS 3.6 11/30/2021    Bone Lab Results  Component Value Date   TESTOSTERONE 33.2 05/20/2020    Inflammation (CRP: Acute Phase) (ESR: Chronic Phase) No results found for: "CRP", "ESRSEDRATE", "LATICACIDVEN"       Note: Above Lab results reviewed.   Physical Exam  General appearance: Well nourished, well developed, and well hydrated. In no apparent acute distress Mental  status: Alert, oriented x 3 (person, place, & time)       Respiratory: No evidence of acute respiratory distress Eyes: PERLA Vitals: BP (!) 143/74   Pulse 66   Temp (!) 97.3 F (36.3 C)   Ht _0  (1.753 m)   Wt 140 lb (63.5 kg)   SpO2 97%   BMI 20.67 kg/m  BMI: Estimated body mass index is 20.67 kg/m as calculated from the following:   Height as of this encounter: _1  (1.753 m).   Weight as of this encounter: 140 lb (63.5 kg). Ideal: Ideal body weight: 70.7 kg (155 lb 13.8 oz)  Lumbar Spine Area Exam  Skin & Axial Inspection: Well healed scar from previous spine surgery detected IPG present from SCS Alignment: Scoliosis detected Functional ROM: Pain restricted ROM       Stability: No instability detected Muscle Tone/Strength: Functionally intact. No obvious neuro-muscular anomalies detected. Sensory (Neurological): Facet agenic pain pattern, pain with facet loading Gait & Posture Assessment  Ambulation: Unassisted Gait: Relatively normal for age and body habitus Posture: WNL    Lower Extremity Exam      Side: Right lower extremity   Side: Left lower extremity  Stability: No instability observed           Stability: No instability observed          Skin & Extremity Inspection: Skin color, temperature, and hair growth are WNL. No peripheral edema or cyanosis. No masses, redness, swelling, asymmetry, or associated skin lesions. No contractures.   Skin & Extremity Inspection: Skin color, temperature, and hair growth are WNL. No peripheral edema or cyanosis. No masses, redness, swelling, asymmetry, or associated skin lesions. No contractures.  Functional ROM: Unrestricted ROM                   Functional ROM: Unrestricted ROM                  Muscle Tone/Strength: Functionally intact. No obvious neuro-muscular anomalies detected.   Muscle Tone/Strength: Functionally intact. No obvious neuro-muscular anomalies detected.  Sensory (Neurological): Unimpaired         Sensory  (Neurological): Unimpaired        DTR: Patellar: deferred today Achilles: deferred today Plantar: deferred today   DTR: Patellar: deferred today Achilles: deferred today Plantar: deferred today  Palpation: No palpable anomalies   Palpation:  No palpable anomalies     Assessment   Status Diagnosis  Controlled Controlled Controlled 1. Chronic pain syndrome   2. Lumbar facet arthropathy   3. Lumbar spondylosis   4. Neuropathic pain   5. 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 Bilateral occipital neuralgia   7. 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 TRAYLON SCHIMMING has a current medication list which includes the following long-term medication(s): cetirizine, dicyclomine, escitalopram, ezetimibe, ferrous sulfate, ipratropium, nortriptyline, prochlorperazine, propranolol er, pyridostigmine, rabeprazole, sucralfate, and testosterone cypionate.  Pharmacotherapy (Medications Ordered): Meds ordered this encounter  Medications   HYDROcodone-acetaminophen (NORCO) 10-325 MG tablet    Sig: Take 1 tablet by mouth every 8 (eight) hours as needed for severe pain. Must last 30 days.    Dispense:  90 tablet    Refill:  0    Chronic Pain: STOP Act (Not applicable) Fill 1 day early if closed on refill date. Avoid benzodiazepines within 8 hours of opioids   HYDROcodone-acetaminophen (NORCO) 10-325 MG tablet    Sig: Take 1 tablet by mouth every 8 (eight) hours as needed for severe pain. Must last 30 days.    Dispense:  90 tablet    Refill:  0    Chronic Pain: STOP Act (Not applicable) Fill 1 day early if closed on refill date. Avoid benzodiazepines within 8 hours of opioids   HYDROcodone-acetaminophen (NORCO) 10-325 MG tablet    Sig: Take 1 tablet by mouth every 8 (eight) hours as needed for severe pain. Must last 30 days.    Dispense:  90 tablet    Refill:  0    Chronic Pain: STOP Act (Not applicable) Fill 1 day early  if closed on refill date. Avoid benzodiazepines within 8 hours of opioids    Follow-up plan:   Return for Patient will call to schedule face-to-face visit for med refill.     Status post Botox No. 1 on 01/01/2020 for migraine, lumbar TPI as well. SPG block 03/13/20. Bilateral GONB 04/01/20. 11/13/20 B/L L3,4,5 Facet blocks helpful repeat prn           Recent Visits Date Type Provider Dept  05/26/22 Office Visit [name], MD [facility]  Showing recent visits within past 90 days and meeting all other requirements Today's Visits Date Type Provider Dept  07/30/22 Office Visit [name], MD [facility]  Showing today's visits and meeting all other requirements Future Appointments No visits were found meeting these conditions. Showing future appointments within next 90 days and meeting all other requirements  I discussed the assessment and treatment plan with the patient. The patient was provided an opportunity to ask questions and all were answered. The patient agreed with the plan and demonstrated an understanding of the instructions.  Patient advised to call back or seek an in-person evaluation if the symptoms or condition worsens.  Duration of encounter: 74mnutes.  Note by: BGillis Santa MD Date: 07/30/2022; Time: 11:18 AM

## 2022-07-30 NOTE — Telephone Encounter (Signed)
Okay to increase nortriptyline to 20 mg nightly while on vacation.  Can he take 2 10 mg tablets or is he asking for a prescription for 20 mg for a limited supply?

## 2022-08-11 ENCOUNTER — Encounter: Payer: Self-pay | Admitting: Internal Medicine

## 2022-08-12 ENCOUNTER — Other Ambulatory Visit: Payer: Self-pay | Admitting: Internal Medicine

## 2022-08-12 NOTE — Telephone Encounter (Signed)
Unable to refill per protocol, Rx expired. Medication was discontinued 06/18/22. Will refuse.  Requested Prescriptions  Pending Prescriptions Disp Refills   propranolol (INDERAL) 40 MG tablet [Pharmacy Med Name: PROPRANOLOL 40 MG TABLET] 180 tablet 1    Sig: TAKE 1 TABLET BY MOUTH TWICE A DAY     Cardiovascular:  Beta Blockers Failed - 08/12/2022  2:43 AM      Failed - Last BP in normal range    BP Readings from Last 1 Encounters:  07/30/22 (!) 143/74         Passed - Last Heart Rate in normal range    Pulse Readings from Last 1 Encounters:  07/30/22 66         Passed - Valid encounter within last 6 months    Recent Outpatient Visits           1 month ago Essential hypertension   [place], Grayland Ormond A, RPH-CPP   1 month ago Intractable chronic migraine without aura and without status migrainosus   [facility] [place], [name], NP   2 months ago Essential hypertension   [place], Grayland Ormond A, RPH-CPP   2 months ago Gastroesophageal reflux disease without esophagitis   [facility] [place], [name], NP   3 months ago Community acquired pneumonia of right lower lobe of lung   [facility] [place], [name], NP       Future Appointments             In 1 week Sharolyn Douglas, Clance Boll, NP [place] [place]   In 2 months Hilty, Nadean Corwin, MD [facility]   In 3 months [name], [name], NP [name], [place]   In 9 months Diamantina Providence, Herbert Seta, MD [place]

## 2022-08-13 ENCOUNTER — Encounter: Payer: Self-pay | Admitting: Internal Medicine

## 2022-08-13 ENCOUNTER — Ambulatory Visit
Admission: RE | Admit: 2022-08-13 | Discharge: 2022-08-13 | Disposition: A | Discharge status: Home / Self Care | Payer: Medicare Other | Source: Ambulatory Visit | Attending: Urgent Care | Admitting: Urgent Care

## 2022-08-13 ENCOUNTER — Ambulatory Visit: Payer: Medicare Other | Admitting: Cardiology

## 2022-08-13 VITALS — BP 132/84 | HR 73 | Temp 98.4°F | Resp 16

## 2022-08-13 DIAGNOSIS — R0981 Nasal congestion: Secondary | ICD-10-CM | POA: Diagnosis not present

## 2022-08-13 DIAGNOSIS — R058 Other specified cough: Secondary | ICD-10-CM | POA: Diagnosis present

## 2022-08-13 DIAGNOSIS — R509 Fever, unspecified: Secondary | ICD-10-CM | POA: Insufficient documentation

## 2022-08-13 DIAGNOSIS — R531 Weakness: Secondary | ICD-10-CM | POA: Diagnosis not present

## 2022-08-13 DIAGNOSIS — Z1152 Encounter for screening for COVID-19: Secondary | ICD-10-CM | POA: Insufficient documentation

## 2022-08-13 LAB — RESP PANEL BY RT-PCR (RSV, FLU A&B, COVID)  RVPGX2
Influenza A by PCR: NEGATIVE
Influenza B by PCR: NEGATIVE
Resp Syncytial Virus by PCR: NEGATIVE
SARS Coronavirus 2 by RT PCR: NEGATIVE

## 2022-08-13 MED ORDER — BENZONATATE 100 MG PO CAPS: ORAL_CAPSULE | ORAL | 0 refills | Status: DC

## 2022-08-13 NOTE — ED Provider Notes (Signed)
Grant Young    CSN: [id number] Arrival date & time: 08/13/22  1350      History   Chief Complaint Chief Complaint  Patient presents with   Cough    PCP wanted me to be tested for Covid, Flu and RSV.  I was tested yesterday for Covid and Flu, both negative.I ran a high fever all day Tuesday, Wed still a fever but mostly below 100.   Still feel awful, coughing quite a bit as well as nose running. - Entered by patient    HPI Grant Young is a 76 y.o. male.    Cough   Presents to urgent care with request for respiratory testing.  Patient endorses symptoms starting 2 weeks ago when [place]. Endorses fever (tmax 103.xF) accompanied by coughing nasal congestion and drainage.    Tested negative for influenza A and B as well as COVID.  He states his testing was done at [facility].  Requesting RSV testing today as well.  He states drainage from his nose is mostly liquid and occurred when he was on the cruise.  He also endorses some profound weakness where he was finding difficulty getting onto the couch or bed and had to be assisted by his wife.  Presents today with report of ongoing dry hacking cough  Reports treated for pneumonia last July requiring inpatient antibiotics and continues with ongoing management and evaluation by pulmonology.  He has CT scheduled for December 7 and pulmonology follow-up scheduled for December 12.  Past Medical History:  Diagnosis Date   Anxiety    Per New Patient Packet   Chronic back pain    Per New Patient Packet   Chronic heart disease    Per New Patient Packet   Depression    GERD (gastroesophageal reflux disease)    Headache    History of gastritis    Per New Patient Packet   History of neuropathy    Per New Patient Packet   Hypertension    Per New Patient Packet   Insomnia    Kidney stone    Lumbar radicular pain    Per New Patient Packet   Lyme disease    Per New Patient Packet    Myocardial infarction [facility])    Overactive bladder    PONV (postoperative nausea and vomiting)    Skin cancer, basal cell    Spinal cord stimulator status    01/08/21 - not currently using.   Squamous cell skin cancer    Substance abuse [facility])     Patient Active Problem List   Diagnosis Date Noted   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(HCC) 11/28/2021   Chronic migraine without aura 11/28/2021   Migraine, unspecified, not intractable, without status migrainosus 11/27/2021   Pure hypercholesterolemia, unspecified 11/11/2021   Benign prostatic hyperplasia with lower urinary tract symptoms 11/11/2021   Frequency of micturition 11/11/2021   Restless leg syndrome 09/01/2021   Orthostasis 09/01/2021   Presence of neurostimulator 01/08/2021   OAB (overactive bladder) 12/03/2020   Cold sore 10/24/2020   GERD (gastroesophageal reflux disease) 10/24/2020   Hypotestosteronism 10/24/2020   IBS (irritable bowel syndrome) 10/24/2020   Lyme disease 10/24/2020   Spinal cord stimulator status [employer]) 12/19/2019   Lumbar spondylosis 12/19/2019   Chronic pain syndrome 12/19/2019   Tremor 09/27/2019   Essential hypertension 09/21/2019   Anxiety and depression 12/21/2001  Hyperlipidemia LDL goal <70 09/30/2001   Polyneuropathy, unspecified 09/15/1999   Personal history of urinary calculi 09/15/1999   Personal history of other malignant neoplasm of skin 09/15/1999   Hypertensive heart disease without heart failure 09/15/1999   Long term current use of anticoagulant 09/15/1999    Past Surgical History:  Procedure Laterality Date   BACK SURGERY     CARDIAC CATHETERIZATION     CATARACT EXTRACTION W/PHACO Left 01/14/2021   Procedure: CATARACT EXTRACTION PHACO AND INTRAOCULAR LENS PLACEMENT ([place]) LEFT VIVITY TORIC LENS 8.75 00:56.7;  Surgeon: Birder Robson, MD;  Location: [place];  Service:  Ophthalmology;  Laterality: Left;   CATARACT EXTRACTION W/PHACO Right 01/28/2021   Procedure: CATARACT EXTRACTION PHACO AND INTRAOCULAR LENS PLACEMENT ([place]) RIGHT VIVITY TORIC LENS;  Surgeon: Birder Robson, MD;  Location: [place];  Service: Ophthalmology;  Laterality: Right;  6.54 00:46.4   CHOLECYSTECTOMY  2017   COLONOSCOPY  09/15/2015   Per New Patient Packet   COLONOSCOPY WITH PROPOFOL N/A 10/03/2020   Procedure: COLONOSCOPY WITH PROPOFOL;  Surgeon: [name], MD;  Location: [facility] ENDOSCOPY;  Service: Gastroenterology;  Laterality: N/A;   CORONARY/GRAFT ACUTE MI REVASCULARIZATION N/A 11/27/2021   Procedure: Coronary/Graft Acute MI Revascularization;  Surgeon: Leonie Man, MD;  Location: [place] CV LAB;  Service: Cardiovascular;  Laterality: N/A;   ESOPHAGOGASTRODUODENOSCOPY (EGD) WITH PROPOFOL N/A 10/03/2020   Procedure: ESOPHAGOGASTRODUODENOSCOPY (EGD) WITH PROPOFOL;  Surgeon: [name], MD;  Location: [facility] ENDOSCOPY;  Service: Gastroenterology;  Laterality: N/A;   FOOT SURGERY Bilateral 03/18/2020   FOOT SURGERY  08/032021   GALLBLADDER SURGERY  09/15/2015   Gallbladder Removal. Procedure done by Dr.Beverly. Per New Patient Packet   KIDNEY STONE SURGERY  09/14/1980   Too many to count. Per New Patient Packet 09/14/1980-09/15/1995   KIDNEY STONE SURGERY  09/15/1995   Too many to count. Per New Patient Packet   LEFT HEART CATH AND CORONARY ANGIOGRAPHY N/A 11/27/2021   Procedure: LEFT HEART CATH AND CORONARY ANGIOGRAPHY;  Surgeon: Leonie Man, MD;  Location: [place] CV LAB;  Service: Cardiovascular;  Laterality: N/A;   LITHOTRIPSY  09/14/2014   Per New Patient Packet   LITHOTRIPSY  09/14/1996   No Stints Used. Per New Patient Packet   LITHOTRIPSY  09/14/1997   No Stints used. Per New Patient Packet   PAIN PUMP IMPLANTATION     PAIN PUMP REMOVAL     SHOULDER SURGERY Right 1984   SIGMOIDOSCOPY  09/14/2017   Per New Patient Packet   SPINAL CORD  STIMULATOR IMPLANT     TONSILLECTOMY     UPPER GI ENDOSCOPY         Home Medications    Prior to Admission medications   Medication Sig Start Date End Date Taking? Authorizing Provider  anastrozole (ARIMIDEX) 1 MG tablet 1/2 tablet (0.5 mg) by mouth once weekly 05/07/21   [provider]  aspirin 81 MG chewable tablet Chew 1 tablet (81 mg total) by mouth daily. 12/01/21 12/01/22  Val Riles, MD  Atogepant 60 MG TABS Take 60 mg by mouth once daily 07/07/22   [provider]  butalbital-acetaminophen-caffeine (FIORICET) 50-325-40 MG tablet TAKE 1 TABLET BY MOUTH EVERY 6 HOURS AS NEEDED FOR HEADACHE. 07/07/22   [name], NP  cetirizine (ZYRTEC) 10 MG tablet Take 10 mg by mouth daily as needed for allergies.    [provider]  chlorthalidone (HYGROTON) 25 MG tablet Take 1 tablet by mouth daily.    [provider]  Cholecalciferol 50 MCG (2000 UT) CAPS Take 1 tablet by mouth daily. 04/15/20   [provider]  clopidogrel (PLAVIX) 75 MG tablet Take 1 tablet (75 mg) by mouth once daily 03/24/22   Leonie Man, MD  Cyanocobalamin (VITAMIN B-12) 5000 MCG LOZG Take 1 lozenge by mouth daily.     [provider]  DHEA 25 MG CAPS Take 25 mg by mouth daily.    [provider]  diazepam (VALIUM) 5 MG tablet TAKE 1 TABLET IN THE MORNING AND 2 TABLETS AT BEDTIME 07/07/22   [name], [name], NP  dicyclomine (BENTYL) 10 MG capsule Take 1 capsule (10 mg total) by mouth 4 (four) times daily -  before meals and at bedtime. 06/12/22   [name], NP  Digestive Aids Mixture (DIGESTION GB PO) Take 1 capsule by mouth daily.    [provider]  escitalopram (LEXAPRO) 20 MG tablet TAKE 1 TABLET BY MOUTH EVERY DAY 06/29/22   [name], [name], NP  Evolocumab (REPATHA SURECLICK) 833 MG/ML SOAJ Inject 140 mg into the skin every 14 (fourteen) days. 07/10/22   Hilty, Nadean Corwin, MD  ezetimibe (ZETIA) 10 MG tablet Take 1 tablet (10 mg total)  by mouth daily. 07/13/22   [name], NP  ferrous sulfate 325 (65 FE) MG tablet Take 325 mg by mouth. Once daily on Mondays, Wednesdays and Fridays    [provider]  HYDROcodone-acetaminophen (NORCO) 10-325 MG tablet Take 1 tablet by mouth every 8 (eight) hours as needed for severe pain. Must last 30 days. 08/22/22 09/21/22  [name], MD  HYDROcodone-acetaminophen (NORCO) 10-325 MG tablet Take 1 tablet by mouth every 8 (eight) hours as needed for severe pain. Must last 30 days. 09/21/22 10/21/22  [name], MD  HYDROcodone-acetaminophen (NORCO) 10-325 MG tablet Take 1 tablet by mouth every 8 (eight) hours as needed for severe pain. Must last 30 days. 10/21/22 11/20/22  [name], MD  ipratropium (ATROVENT) 0.06 % nasal spray Place 1 spray into both nostrils 4 (four) times daily as needed for rhinitis. For up to 5-7 days then stop. 04/07/22   Karamalegos, Devonne Doughty, DO  Loperamide HCl (IMODIUM PO) Take by mouth as needed.     [provider]  MAGNESIUM BISGLYCINATE PO Take by mouth daily.    [provider]  melatonin 5 MG TABS Take 5 mg by mouth at bedtime.    [provider]  NONFORMULARY OR COMPOUNDED ITEM Bi-Mix Papaverine '30mg'$ , Phentolamine '1mg'$    Dosage: Inject 1cc per injection    Vial 66m   Qty #5 Refills 6 05/20/22   SBilley Co MD  nortriptyline (PAMELOR) 10 MG capsule Take 1 capsule (10 mg total) by mouth at bedtime. 06/18/22   BJearld Fenton NP  Nutritional Supplements (NUTRITIONAL SUPPLEMENT PO) Take 1 capsule by mouth daily. [name]    [provider]  ondansetron (ZOFRAN) 4 MG tablet Take by mouth. 12/28/15   [provider]  polyethylene glycol (MIRALAX / GLYCOLAX) 17 g packet Take 17 g by mouth daily as needed.    [provider]  Probiotic Product (PROBIOTIC DAILY PO) Take by mouth daily.    [provider]  prochlorperazine (COMPAZINE) 10 MG tablet Take 1 tablet (10 mg total)  by mouth 2 (two) times daily as needed for nausea or vomiting. 07/07/22   BJearld Fenton NP  propranolol ER (INDERAL LA) 60 MG 24 hr capsule Take 1 capsule (  60 mg total) by mouth in the morning and at bedtime. 07/07/22   [name], NP  pyridostigmine (MESTINON) 60 MG tablet Take 0.5 tablets (30 mg total) by mouth 2 (two) times daily. 05/22/22   [name], NP  RABEprazole (ACIPHEX) 20 MG tablet TAKE 1 TABLET BY MOUTH TWICE A DAY 06/29/22   [name], NP  sucralfate (CARAFATE) 1 g tablet Take 1 g by mouth 4 (four) times daily as needed.     [provider]  testosterone cypionate (DEPOTESTOSTERONE CYPIONATE) 200 MG/ML injection Inject 80 mg into the muscle every 14 (fourteen) days. 11/27/20   [provider]  valACYclovir (VALTREX) 1000 MG tablet Take 2 tabs p.o. and repeat in 12 hours as needed for cold sore 10/29/21   [name], NP  Vibegron (GEMTESA) 75 MG TABS Take 75 mg by mouth daily. 05/20/22   Billey Co, MD    Family History Family History  Problem Relation Age of Onset   Heart disease Father    Heart failure Father    Hypertension Father    Stroke Father    Stroke Mother    Dementia Mother    Kidney Stones Daughter    Anxiety disorder Daughter    OCD Daughter     Social History Social History   Tobacco Use   Smoking status: Never    Passive exposure: Never   Smokeless tobacco: Never  Vaping Use   Vaping Use: Never used  Substance Use Topics   Alcohol use: Yes    Comment: 1 Drink a Month, socially   Drug use: Not Currently     Allergies   Ace inhibitors, Fluoxetine, Metoclopramide, Nalbuphine, Other, Amoxicillin-pot clavulanate, Doxazosin, Duloxetine, Penicillins, Tamsulosin, Trazodone and nefazodone, Amlodipine, Cinoxacin, Ciprofloxacin, Fludrocortisone, Nebivolol, Olanzapine, Olmesartan, Pregabalin, Prostaglandins, Thyroid hormones, Zolpidem, Duloxetine hcl, Fluoxetine hcl, Nucynta [tapentadol], and Phenytoin   Review of  Systems Review of Systems  Respiratory:  Positive for cough.      Physical Exam Triage Vital Signs ED Triage Vitals  Enc Vitals Group     BP      Pulse      Resp      Temp      Temp src      SpO2      Weight      Height      Head Circumference      Peak Flow      Pain Score      Pain Loc      Pain Edu?      Excl. in [place]?    No data found.  Updated Vital Signs There were no vitals taken for this visit.  Visual Acuity Right Eye Distance:   Left Eye Distance:   Bilateral Distance:    Right Eye Near:   Left Eye Near:    Bilateral Near:     Physical Exam Vitals reviewed.  Constitutional:      Appearance: Normal appearance. He is not ill-appearing.  HENT:     Head: Normocephalic.  Cardiovascular:     Rate and Rhythm: Normal rate and regular rhythm.     Pulses: Normal pulses.     Heart sounds: Normal heart sounds.  Pulmonary:     Effort: Pulmonary effort is normal.     Breath sounds: Normal breath sounds. No wheezing or rhonchi.  Skin:    General: Skin is warm and dry.  Neurological:     General: No focal deficit present.  Mental Status: He is alert and oriented to person, place, and time.  Psychiatric:        Mood and Affect: Mood normal.        Behavior: Behavior normal.      UC Treatments / Results  Labs (all labs ordered are listed, but only abnormal results are displayed) Labs Reviewed - No data to display  EKG   Radiology No results found.  Procedures Procedures (including critical care time)  Medications Ordered in UC Medications - No data to display  Initial Impression / Assessment and Plan / UC Course  I have reviewed the triage vital signs and the nursing notes.  Pertinent labs & imaging results that were available during my care of the patient were reviewed by me and considered in my medical decision making (see chart for details).   Patient is afebrile here without recent antipyretics. Satting well on room air. Overall is  well appearing, well hydrated, without respiratory distress. Pulmonary exam is unremarkable.  Lungs CTAB without wheezing or rhonchi.  Suspect post viral bronchitis.  Will prescribe benzonatate for cough suppression.  Low suspicion for pneumonia given unremarkable pulmonary exam.  Patient will follow-up with his primary care and pulmonary provider, or ED if symptoms worsen..  Final Clinical Impressions(s) / UC Diagnoses   Final diagnoses:  None   Discharge Instructions   None    ED Prescriptions   None    PDMP not reviewed this encounter.   Rose Phi, Brandt 08/13/22 1441

## 2022-08-13 NOTE — ED Triage Notes (Signed)
Pt. Presents to UC w/ c/o a cough, nasal drainage and a fever that started 2 weeks ago but has worsened over the past 3 days.

## 2022-08-13 NOTE — Discharge Instructions (Signed)
Your pulmonary exam today was unremarkable.  There was no signs of infection in your lungs.  I suspect your symptoms are from bronchitis following a viral infection.  I have prescribed a cough medication which you can take 3 times a day.  Please follow-up with your primary care or pulmonary provider.  If your symptoms acutely worsen, please go to the ED for immediate evaluation.

## 2022-08-14 MED ORDER — DIAZEPAM 5 MG PO TABS: ORAL_TABLET | ORAL | 0 refills | Status: DC

## 2022-08-17 ENCOUNTER — Encounter: Payer: Self-pay | Admitting: Internal Medicine

## 2022-08-17 MED ORDER — NORTRIPTYLINE HCL 25 MG PO CAPS: 25.0000 mg | ORAL_CAPSULE | Freq: Every day | ORAL | 0 refills | Status: DC

## 2022-08-18 ENCOUNTER — Inpatient Hospital Stay
Admission: EM | Admit: 2022-08-18 | Discharge: 2022-08-20 | DRG: 194 | Disposition: A | Discharge status: Home / Self Care | Payer: Medicare Other | Attending: Hospitalist | Admitting: Hospitalist

## 2022-08-18 ENCOUNTER — Emergency Department: Payer: Medicare Other

## 2022-08-18 ENCOUNTER — Other Ambulatory Visit: Payer: Self-pay

## 2022-08-18 ENCOUNTER — Inpatient Hospital Stay: Payer: Medicare Other

## 2022-08-18 DIAGNOSIS — Z886 Allergy status to analgesic agent status: Secondary | ICD-10-CM

## 2022-08-18 DIAGNOSIS — E876 Hypokalemia: Secondary | ICD-10-CM | POA: Diagnosis present

## 2022-08-18 DIAGNOSIS — Z79891 Long term (current) use of opiate analgesic: Secondary | ICD-10-CM

## 2022-08-18 DIAGNOSIS — Z9049 Acquired absence of other specified parts of digestive tract: Secondary | ICD-10-CM

## 2022-08-18 DIAGNOSIS — I77810 Thoracic aortic ectasia: Secondary | ICD-10-CM | POA: Diagnosis present

## 2022-08-18 DIAGNOSIS — Z87442 Personal history of urinary calculi: Secondary | ICD-10-CM

## 2022-08-18 DIAGNOSIS — I951 Orthostatic hypotension: Secondary | ICD-10-CM | POA: Diagnosis present

## 2022-08-18 DIAGNOSIS — G629 Polyneuropathy, unspecified: Secondary | ICD-10-CM | POA: Diagnosis present

## 2022-08-18 DIAGNOSIS — J90 Pleural effusion, not elsewhere classified: Secondary | ICD-10-CM | POA: Diagnosis present

## 2022-08-18 DIAGNOSIS — Z820 Family history of epilepsy and other diseases of the nervous system: Secondary | ICD-10-CM

## 2022-08-18 DIAGNOSIS — R053 Chronic cough: Secondary | ICD-10-CM | POA: Diagnosis present

## 2022-08-18 DIAGNOSIS — Z9682 Presence of neurostimulator: Secondary | ICD-10-CM

## 2022-08-18 DIAGNOSIS — M5481 Occipital neuralgia: Secondary | ICD-10-CM | POA: Diagnosis present

## 2022-08-18 DIAGNOSIS — Z20822 Contact with and (suspected) exposure to covid-19: Secondary | ICD-10-CM | POA: Diagnosis present

## 2022-08-18 DIAGNOSIS — E440 Moderate protein-calorie malnutrition: Secondary | ICD-10-CM | POA: Diagnosis present

## 2022-08-18 DIAGNOSIS — F32A Depression, unspecified: Secondary | ICD-10-CM | POA: Diagnosis present

## 2022-08-18 DIAGNOSIS — I252 Old myocardial infarction: Secondary | ICD-10-CM

## 2022-08-18 DIAGNOSIS — G2581 Restless legs syndrome: Secondary | ICD-10-CM | POA: Diagnosis present

## 2022-08-18 DIAGNOSIS — I2510 Atherosclerotic heart disease of native coronary artery without angina pectoris: Secondary | ICD-10-CM | POA: Diagnosis present

## 2022-08-18 DIAGNOSIS — Z79899 Other long term (current) drug therapy: Secondary | ICD-10-CM

## 2022-08-18 DIAGNOSIS — Z8249 Family history of ischemic heart disease and other diseases of the circulatory system: Secondary | ICD-10-CM

## 2022-08-18 DIAGNOSIS — A6920 Lyme disease, unspecified: Secondary | ICD-10-CM | POA: Diagnosis present

## 2022-08-18 DIAGNOSIS — B9719 Other enterovirus as the cause of diseases classified elsewhere: Secondary | ICD-10-CM | POA: Diagnosis present

## 2022-08-18 DIAGNOSIS — E7800 Pure hypercholesterolemia, unspecified: Secondary | ICD-10-CM | POA: Diagnosis present

## 2022-08-18 DIAGNOSIS — Z6820 Body mass index (BMI) 20.0-20.9, adult: Secondary | ICD-10-CM

## 2022-08-18 DIAGNOSIS — Z823 Family history of stroke: Secondary | ICD-10-CM

## 2022-08-18 DIAGNOSIS — G894 Chronic pain syndrome: Secondary | ICD-10-CM | POA: Diagnosis present

## 2022-08-18 DIAGNOSIS — R0902 Hypoxemia: Secondary | ICD-10-CM | POA: Diagnosis present

## 2022-08-18 DIAGNOSIS — R0989 Other specified symptoms and signs involving the circulatory and respiratory systems: Secondary | ICD-10-CM | POA: Diagnosis present

## 2022-08-18 DIAGNOSIS — K589 Irritable bowel syndrome without diarrhea: Secondary | ICD-10-CM | POA: Diagnosis present

## 2022-08-18 DIAGNOSIS — E785 Hyperlipidemia, unspecified: Secondary | ICD-10-CM | POA: Diagnosis present

## 2022-08-18 DIAGNOSIS — Z841 Family history of disorders of kidney and ureter: Secondary | ICD-10-CM

## 2022-08-18 DIAGNOSIS — J189 Pneumonia, unspecified organism: Secondary | ICD-10-CM | POA: Diagnosis not present

## 2022-08-18 DIAGNOSIS — Z8719 Personal history of other diseases of the digestive system: Secondary | ICD-10-CM

## 2022-08-18 DIAGNOSIS — Z85828 Personal history of other malignant neoplasm of skin: Secondary | ICD-10-CM | POA: Diagnosis not present

## 2022-08-18 DIAGNOSIS — Z9841 Cataract extraction status, right eye: Secondary | ICD-10-CM

## 2022-08-18 DIAGNOSIS — B9789 Other viral agents as the cause of diseases classified elsewhere: Secondary | ICD-10-CM | POA: Diagnosis present

## 2022-08-18 DIAGNOSIS — J1289 Other viral pneumonia: Principal | ICD-10-CM | POA: Diagnosis present

## 2022-08-18 DIAGNOSIS — K449 Diaphragmatic hernia without obstruction or gangrene: Secondary | ICD-10-CM | POA: Diagnosis present

## 2022-08-18 DIAGNOSIS — I10 Essential (primary) hypertension: Secondary | ICD-10-CM | POA: Diagnosis present

## 2022-08-18 DIAGNOSIS — Z7982 Long term (current) use of aspirin: Secondary | ICD-10-CM

## 2022-08-18 DIAGNOSIS — Z881 Allergy status to other antibiotic agents status: Secondary | ICD-10-CM

## 2022-08-18 DIAGNOSIS — R918 Other nonspecific abnormal finding of lung field: Secondary | ICD-10-CM | POA: Diagnosis present

## 2022-08-18 DIAGNOSIS — M5416 Radiculopathy, lumbar region: Secondary | ICD-10-CM | POA: Diagnosis present

## 2022-08-18 DIAGNOSIS — Z955 Presence of coronary angioplasty implant and graft: Secondary | ICD-10-CM

## 2022-08-18 DIAGNOSIS — F419 Anxiety disorder, unspecified: Secondary | ICD-10-CM | POA: Diagnosis present

## 2022-08-18 DIAGNOSIS — N3281 Overactive bladder: Secondary | ICD-10-CM | POA: Diagnosis present

## 2022-08-18 DIAGNOSIS — Z888 Allergy status to other drugs, medicaments and biological substances status: Secondary | ICD-10-CM

## 2022-08-18 DIAGNOSIS — Z818 Family history of other mental and behavioral disorders: Secondary | ICD-10-CM | POA: Diagnosis not present

## 2022-08-18 DIAGNOSIS — Z9842 Cataract extraction status, left eye: Secondary | ICD-10-CM

## 2022-08-18 DIAGNOSIS — Z7902 Long term (current) use of antithrombotics/antiplatelets: Secondary | ICD-10-CM

## 2022-08-18 DIAGNOSIS — N401 Enlarged prostate with lower urinary tract symptoms: Secondary | ICD-10-CM | POA: Diagnosis present

## 2022-08-18 DIAGNOSIS — G43909 Migraine, unspecified, not intractable, without status migrainosus: Secondary | ICD-10-CM | POA: Diagnosis present

## 2022-08-18 DIAGNOSIS — Z8701 Personal history of pneumonia (recurrent): Secondary | ICD-10-CM

## 2022-08-18 DIAGNOSIS — Z880 Allergy status to penicillin: Secondary | ICD-10-CM

## 2022-08-18 DIAGNOSIS — G4700 Insomnia, unspecified: Secondary | ICD-10-CM | POA: Diagnosis present

## 2022-08-18 DIAGNOSIS — K219 Gastro-esophageal reflux disease without esophagitis: Secondary | ICD-10-CM | POA: Diagnosis present

## 2022-08-18 DIAGNOSIS — Z961 Presence of intraocular lens: Secondary | ICD-10-CM | POA: Diagnosis present

## 2022-08-18 DIAGNOSIS — J129 Viral pneumonia, unspecified: Secondary | ICD-10-CM | POA: Diagnosis not present

## 2022-08-18 LAB — URINALYSIS, ROUTINE W REFLEX MICROSCOPIC
Bilirubin Urine: NEGATIVE
Glucose, UA: NEGATIVE mg/dL
Hgb urine dipstick: NEGATIVE
Ketones, ur: NEGATIVE mg/dL
Leukocytes,Ua: NEGATIVE
Nitrite: NEGATIVE
Protein, ur: NEGATIVE mg/dL
Specific Gravity, Urine: >1.046 — ABNORMAL HIGH (ref 1.005–1.030)
pH: 6 (ref 5.0–8.0)

## 2022-08-18 LAB — CBC
HCT: 46.3 % (ref 39.0–52.0)
Hemoglobin: 15.1 g/dL (ref 13.0–17.0)
MCH: 28.1 pg (ref 26.0–34.0)
MCHC: 32.6 g/dL (ref 30.0–36.0)
MCV: 86.2 fL (ref 80.0–100.0)
Platelets: 355 10*3/uL (ref 150–400)
RBC: 5.37 MIL/uL (ref 4.22–5.81)
RDW: 12.6 % (ref 11.5–15.5)
WBC: 11.2 10*3/uL — ABNORMAL HIGH (ref 4.0–10.5)
nRBC: 0 % (ref 0.0–0.2)

## 2022-08-18 LAB — LACTIC ACID, PLASMA
Lactic Acid, Venous: 1.2 mmol/L (ref 0.5–1.9)
Lactic Acid, Venous: 1.4 mmol/L (ref 0.5–1.9)

## 2022-08-18 LAB — RESP PANEL BY RT-PCR (RSV, FLU A&B, COVID)  RVPGX2
Influenza A by PCR: NEGATIVE
Influenza B by PCR: NEGATIVE
Resp Syncytial Virus by PCR: NEGATIVE
SARS Coronavirus 2 by RT PCR: NEGATIVE

## 2022-08-18 LAB — COMPREHENSIVE METABOLIC PANEL
ALT: 19 U/L (ref 0–44)
AST: 21 U/L (ref 15–41)
Albumin: 2.9 g/dL — ABNORMAL LOW (ref 3.5–5.0)
Alkaline Phosphatase: 92 U/L (ref 38–126)
Anion gap: 7 (ref 5–15)
BUN: 17 mg/dL (ref 8–23)
CO2: 28 mmol/L (ref 22–32)
Calcium: 8.8 mg/dL — ABNORMAL LOW (ref 8.9–10.3)
Chloride: 101 mmol/L (ref 98–111)
Creatinine, Ser: 0.75 mg/dL (ref 0.61–1.24)
GFR, Estimated: >60 mL/min (ref >60)
Glucose, Bld: 115 mg/dL — ABNORMAL HIGH (ref 70–99)
Potassium: 3.1 mmol/L — ABNORMAL LOW (ref 3.5–5.1)
Sodium: 136 mmol/L (ref 135–145)
Total Bilirubin: 0.8 mg/dL (ref 0.3–1.2)
Total Protein: 6.6 g/dL (ref 6.5–8.1)

## 2022-08-18 LAB — C-REACTIVE PROTEIN: CRP: 22.9 mg/dL — ABNORMAL HIGH (ref <1.0)

## 2022-08-18 LAB — MAGNESIUM: Magnesium: 2 mg/dL (ref 1.7–2.4)

## 2022-08-18 LAB — TROPONIN I (HIGH SENSITIVITY): Troponin I (High Sensitivity): 11 ng/L (ref <18)

## 2022-08-18 LAB — STREP PNEUMONIAE URINARY ANTIGEN: Strep Pneumo Urinary Antigen: NEGATIVE

## 2022-08-18 LAB — PROCALCITONIN: Procalcitonin: 0.17 ng/mL

## 2022-08-18 MED ORDER — DIAZEPAM 5 MG PO TABS
5.0000 mg | ORAL_TABLET | Freq: Every day | ORAL | Status: DC
  Administered 2022-08-19: 5 mg via ORAL
  Filled 2022-08-18: qty 1

## 2022-08-18 MED ORDER — MELATONIN 5 MG PO TABS
5.0000 mg | ORAL_TABLET | Freq: Every day | ORAL | Status: DC
  Administered 2022-08-18 – 2022-08-19 (×2): 5 mg via ORAL
  Filled 2022-08-18 (×2): qty 1

## 2022-08-18 MED ORDER — IOHEXOL 300 MG/ML  SOLN: 75.0000 mL | Freq: Once | INTRAMUSCULAR | Status: DC | PRN

## 2022-08-18 MED ORDER — CLOPIDOGREL BISULFATE 75 MG PO TABS
75.0000 mg | ORAL_TABLET | Freq: Every day | ORAL | Status: DC
  Administered 2022-08-19 – 2022-08-20 (×2): 75 mg via ORAL
  Filled 2022-08-18 (×2): qty 1

## 2022-08-18 MED ORDER — SODIUM CHLORIDE 0.9 % IV SOLN
2.0000 g | INTRAVENOUS | Status: DC
  Administered 2022-08-18: 2 g via INTRAVENOUS
  Filled 2022-08-18: qty 20

## 2022-08-18 MED ORDER — ENSURE ENLIVE PO LIQD
237.0000 mL | Freq: Two times a day (BID) | ORAL | Status: DC
  Administered 2022-08-19 – 2022-08-20 (×3): 237 mL via ORAL

## 2022-08-18 MED ORDER — ONDANSETRON HCL 4 MG/2ML IJ SOLN: 4.0000 mg | Freq: Three times a day (TID) | INTRAMUSCULAR | Status: DC | PRN

## 2022-08-18 MED ORDER — SODIUM CHLORIDE 0.9 % IV SOLN
500.0000 mg | Freq: Once | INTRAVENOUS | Status: AC
  Administered 2022-08-18: 500 mg via INTRAVENOUS
  Filled 2022-08-18: qty 5

## 2022-08-18 MED ORDER — FERROUS SULFATE 325 (65 FE) MG PO TABS
325.0000 mg | ORAL_TABLET | ORAL | Status: DC
  Administered 2022-08-19: 325 mg via ORAL
  Filled 2022-08-18: qty 1

## 2022-08-18 MED ORDER — NORTRIPTYLINE HCL 25 MG PO CAPS
25.0000 mg | ORAL_CAPSULE | Freq: Every day | ORAL | Status: DC
  Administered 2022-08-18 – 2022-08-19 (×2): 25 mg via ORAL
  Filled 2022-08-18 (×2): qty 1

## 2022-08-18 MED ORDER — PANTOPRAZOLE SODIUM 40 MG PO TBEC
40.0000 mg | DELAYED_RELEASE_TABLET | Freq: Two times a day (BID) | ORAL | Status: DC
  Administered 2022-08-18 – 2022-08-20 (×4): 40 mg via ORAL
  Filled 2022-08-18 (×4): qty 1

## 2022-08-18 MED ORDER — ACETAMINOPHEN 325 MG PO TABS: 650.0000 mg | ORAL_TABLET | Freq: Four times a day (QID) | ORAL | Status: DC | PRN

## 2022-08-18 MED ORDER — ANASTROZOLE 1 MG PO TABS
0.5000 mg | ORAL_TABLET | ORAL | Status: DC
  Administered 2022-08-20: 0.5 mg via ORAL
  Filled 2022-08-18: qty 1

## 2022-08-18 MED ORDER — PROPRANOLOL HCL ER 60 MG PO CP24
60.0000 mg | ORAL_CAPSULE | Freq: Two times a day (BID) | ORAL | Status: DC
  Administered 2022-08-18 – 2022-08-20 (×4): 60 mg via ORAL
  Filled 2022-08-18 (×4): qty 1

## 2022-08-18 MED ORDER — VITAMIN B-12 1000 MCG PO TABS
5000.0000 ug | ORAL_TABLET | Freq: Every day | ORAL | Status: DC
  Administered 2022-08-19 – 2022-08-20 (×2): 5000 ug via ORAL
  Filled 2022-08-18: qty 50
  Filled 2022-08-18 (×2): qty 5

## 2022-08-18 MED ORDER — HYDROCODONE-ACETAMINOPHEN 10-325 MG PO TABS: 1.0000 | ORAL_TABLET | Freq: Three times a day (TID) | ORAL | Status: DC | PRN

## 2022-08-18 MED ORDER — ASPIRIN 81 MG PO CHEW
81.0000 mg | CHEWABLE_TABLET | Freq: Every day | ORAL | Status: DC
  Administered 2022-08-19 – 2022-08-20 (×2): 81 mg via ORAL
  Filled 2022-08-18 (×2): qty 1

## 2022-08-18 MED ORDER — LOPERAMIDE HCL 2 MG PO CAPS: 2.0000 mg | ORAL_CAPSULE | Freq: Two times a day (BID) | ORAL | Status: DC | PRN

## 2022-08-18 MED ORDER — IOHEXOL 300 MG/ML  SOLN
75.0000 mL | Freq: Once | INTRAMUSCULAR | Status: AC | PRN
  Administered 2022-08-18: 75 mL via INTRAVENOUS

## 2022-08-18 MED ORDER — DIAZEPAM 5 MG PO TABS
10.0000 mg | ORAL_TABLET | Freq: Every day | ORAL | Status: DC
  Administered 2022-08-18 – 2022-08-19 (×2): 10 mg via ORAL
  Filled 2022-08-18 (×2): qty 2

## 2022-08-18 MED ORDER — BUTALBITAL-APAP-CAFFEINE 50-325-40 MG PO TABS: 1.0000 | ORAL_TABLET | Freq: Four times a day (QID) | ORAL | Status: DC | PRN

## 2022-08-18 MED ORDER — RISAQUAD PO CAPS
1.0000 | ORAL_CAPSULE | Freq: Every day | ORAL | Status: DC
  Administered 2022-08-18 – 2022-08-20 (×3): 1 via ORAL
  Filled 2022-08-18 (×3): qty 1

## 2022-08-18 MED ORDER — HYDRALAZINE HCL 20 MG/ML IJ SOLN: 5.0000 mg | INTRAMUSCULAR | Status: DC | PRN

## 2022-08-18 MED ORDER — ATOGEPANT 60 MG PO TABS: 60.0000 mg | ORAL_TABLET | Freq: Every day | ORAL | Status: DC

## 2022-08-18 MED ORDER — DHEA 25 MG PO CAPS: 25.0000 mg | ORAL_CAPSULE | Freq: Every day | ORAL | Status: DC

## 2022-08-18 MED ORDER — PYRIDOSTIGMINE BROMIDE 60 MG PO TABS
30.0000 mg | ORAL_TABLET | Freq: Two times a day (BID) | ORAL | Status: DC
  Administered 2022-08-18 – 2022-08-20 (×4): 30 mg via ORAL
  Filled 2022-08-18 (×4): qty 0.5

## 2022-08-18 MED ORDER — ALBUTEROL SULFATE (2.5 MG/3ML) 0.083% IN NEBU: 2.5000 mg | INHALATION_SOLUTION | RESPIRATORY_TRACT | Status: DC | PRN

## 2022-08-18 MED ORDER — VITAMIN D 25 MCG (1000 UNIT) PO TABS
2000.0000 [IU] | ORAL_TABLET | Freq: Every day | ORAL | Status: DC
  Administered 2022-08-18 – 2022-08-20 (×3): 2000 [IU] via ORAL
  Filled 2022-08-18 (×3): qty 2

## 2022-08-18 MED ORDER — ESCITALOPRAM OXALATE 10 MG PO TABS
20.0000 mg | ORAL_TABLET | Freq: Every day | ORAL | Status: DC
  Administered 2022-08-18 – 2022-08-20 (×3): 20 mg via ORAL
  Filled 2022-08-18 (×3): qty 2

## 2022-08-18 MED ORDER — EZETIMIBE 10 MG PO TABS
10.0000 mg | ORAL_TABLET | Freq: Every day | ORAL | Status: DC
  Administered 2022-08-18 – 2022-08-20 (×3): 10 mg via ORAL
  Filled 2022-08-18 (×3): qty 1

## 2022-08-18 MED ORDER — ALBUTEROL SULFATE HFA 108 (90 BASE) MCG/ACT IN AERS: 2.0000 | INHALATION_SPRAY | RESPIRATORY_TRACT | Status: DC | PRN

## 2022-08-18 MED ORDER — DICYCLOMINE HCL 10 MG PO CAPS: 10.0000 mg | ORAL_CAPSULE | Freq: Three times a day (TID) | ORAL | Status: DC

## 2022-08-18 MED ORDER — CHLORTHALIDONE 25 MG PO TABS
25.0000 mg | ORAL_TABLET | Freq: Every day | ORAL | Status: DC
  Administered 2022-08-19 – 2022-08-20 (×2): 25 mg via ORAL
  Filled 2022-08-18 (×2): qty 1

## 2022-08-18 MED ORDER — DM-GUAIFENESIN ER 30-600 MG PO TB12
1.0000 | ORAL_TABLET | Freq: Two times a day (BID) | ORAL | Status: DC | PRN
  Administered 2022-08-19 – 2022-08-20 (×3): 1 via ORAL
  Filled 2022-08-18 (×4): qty 1

## 2022-08-18 MED ORDER — POTASSIUM CHLORIDE CRYS ER 20 MEQ PO TBCR
40.0000 meq | EXTENDED_RELEASE_TABLET | Freq: Once | ORAL | Status: AC
  Administered 2022-08-18: 40 meq via ORAL
  Filled 2022-08-18: qty 2

## 2022-08-18 MED ORDER — ENOXAPARIN SODIUM 40 MG/0.4ML IJ SOSY
40.0000 mg | PREFILLED_SYRINGE | INTRAMUSCULAR | Status: DC
  Administered 2022-08-18 – 2022-08-19 (×2): 40 mg via SUBCUTANEOUS
  Filled 2022-08-18 (×2): qty 0.4

## 2022-08-18 MED ORDER — MAGNESIUM BISGLYCINATE 100 MG PO TABS: ORAL_TABLET | Freq: Every day | ORAL | Status: DC

## 2022-08-18 NOTE — ED Triage Notes (Signed)
Pt here with a cough and congestion. Pt states he has not been feeling well lately and was sent here from UC. Pt stable and ambulatory in triage.

## 2022-08-18 NOTE — ED Provider Notes (Signed)
[facility] Provider Note    Event Date/Time   First MD Initiated Contact with Patient 08/18/22 1134     (approximate)   History   Cough   HPI  Grant Young is a 76 y.o. male with a past medical history of coronary artery disease, hypercholesterolemia, GERD, chronic pain syndrome, hypertension, anxiety depression who presents today for evaluation of cough and congestion.  Patient reports that he has had the symptoms since mid November.  He reports that he had a runny nose to begin with, and did not think too much of it.  He reports that he went to [place] on a river cruise from 11/17 until 11/27.  He has developed fever with a Tmax of 103 F last week, as well as a couple of days ago.  He reports that his temperature has been hovering around 100 degrees.  He has been feeling mildly short of breath.  He reports that he is being followed by Dr. Raul Del with pulmonology for a "lung lesion."  He reports that the symptoms began prior to his trip to [place].  He reports no chest pain.  He has not had any calf pain or leg swelling.  He took Tylenol this morning.  Patient Active Problem List   Diagnosis Date Noted   CAP (community acquired pneumonia) 08/18/2022   Atypical pneumonia 08/18/2022   CAD (coronary artery disease) 08/18/2022   Hypokalemia 08/18/2022   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(HCC) 11/28/2021   Chronic migraine without aura 11/28/2021   Migraine, unspecified, not intractable, without status migrainosus 11/27/2021   Pure hypercholesterolemia, unspecified 11/11/2021   Benign prostatic hyperplasia with lower urinary tract symptoms 11/11/2021   Frequency of micturition 11/11/2021   Restless leg syndrome 09/01/2021   Orthostasis 09/01/2021   Presence of neurostimulator 01/08/2021   OAB (overactive bladder) 12/03/2020   Cold sore 10/24/2020   GERD (gastroesophageal  reflux disease) 10/24/2020   Hypotestosteronism 10/24/2020   IBS (irritable bowel syndrome) 10/24/2020   Lyme disease 10/24/2020   Spinal cord stimulator status [employer]) 12/19/2019   Lumbar spondylosis 12/19/2019   Chronic pain syndrome 12/19/2019   Tremor 09/27/2019   Essential hypertension 09/21/2019   Anxiety and depression 12/21/2001   Hyperlipidemia LDL goal <70 09/30/2001   Polyneuropathy, unspecified 09/15/1999   Personal history of urinary calculi 09/15/1999   Personal history of other malignant neoplasm of skin 09/15/1999   Hypertensive heart disease without heart failure 09/15/1999   Long term current use of anticoagulant 09/15/1999          Physical Exam   Triage Vital Signs: ED Triage Vitals  Enc Vitals Group     BP      Pulse      Resp      Temp      Temp src      SpO2      Weight      Height      Head Circumference      Peak Flow      Pain Score      Pain Loc      Pain Edu?      Excl. in [place]?     Most recent vital signs: Vitals:   08/18/22 1134 08/18/22 1153  BP: 132/87   Pulse: 78   Resp: 20   Temp: 98.1 F (36.7 C)   SpO2: (!) 87% 92%    Physical  Exam Vitals and nursing note reviewed.  Constitutional:      General: Awake and alert. No acute distress.    Appearance: Normal appearance. The patient is normal weight.  HENT:     Head: Normocephalic and atraumatic.     Mouth: Mucous membranes are moist.  Eyes:     General: PERRL. Normal EOMs        Right eye: No discharge.        Left eye: No discharge.     Conjunctiva/sclera: Conjunctivae normal.  Cardiovascular:     Rate and Rhythm: Normal rate and regular rhythm.     Pulses: Normal pulses.     Heart sounds: Normal heart sounds Pulmonary:     Effort: Pulmonary effort is normal. Requiring 3-4L [place], but no accessory muscle use    Breath sounds: Normal breath sounds.  He is able to speak in complete sentences Abdominal:     Abdomen is soft. There is no abdominal tenderness.  No rebound or guarding. No distention. Musculoskeletal:        General: No swelling. Normal range of motion.     Cervical back: Normal range of motion and neck supple.  No lower extremity edema Skin:    General: Skin is warm and dry.     Capillary Refill: Capillary refill takes less than 2 seconds.     Findings: No rash.  Neurological:     Mental Status: The patient is awake and alert.      ED Results / Procedures / Treatments   Labs (all labs ordered are listed, but only abnormal results are displayed) Labs Reviewed  CBC - Abnormal; Notable for the following components:      Result Value   WBC 11.2 (*)    All other components within normal limits  COMPREHENSIVE METABOLIC PANEL - Abnormal; Notable for the following components:   Potassium 3.1 (*)    Glucose, Bld 115 (*)    Calcium 8.8 (*)    Albumin 2.9 (*)    All other components within normal limits  RESP PANEL BY RT-PCR (RSV, FLU A&B, COVID)  RVPGX2  CULTURE, BLOOD (ROUTINE X 2)  CULTURE, BLOOD (ROUTINE X 2)  EXPECTORATED SPUTUM ASSESSMENT W GRAM STAIN, RFLX TO RESP C  LACTIC ACID, PLASMA  MAGNESIUM  LACTIC ACID, PLASMA  URINALYSIS, ROUTINE W REFLEX MICROSCOPIC  LEGIONELLA PNEUMOPHILA SEROGP 1 UR AG  STREP PNEUMONIAE URINARY ANTIGEN  TROPONIN I (HIGH SENSITIVITY)     EKG     RADIOLOGY I independently reviewed and interpreted imaging and agree with radiologists findings.     PROCEDURES:  Critical Care performed:   Procedures   MEDICATIONS ORDERED IN ED: Medications  azithromycin (ZITHROMAX) 500 mg in sodium chloride 0.9 % 250 mL IVPB (500 mg Intravenous New Bag/Given 08/18/22 1344)  cefTRIAXone (ROCEPHIN) 2 g in sodium chloride 0.9 % 100 mL IVPB (0 g Intravenous Stopped 08/18/22 1343)  dextromethorphan-guaiFENesin (MUCINEX DM) 30-600 MG per 12 hr tablet 1 tablet (has no administration in time range)  ondansetron (ZOFRAN) injection 4 mg (has no administration in time range)  acetaminophen (TYLENOL)  tablet 650 mg (has no administration in time range)  hydrALAZINE (APRESOLINE) injection 5 mg (has no administration in time range)  potassium chloride SA (KLOR-CON M) CR tablet 40 mEq (has no administration in time range)  albuterol (PROVENTIL) (2.5 MG/3ML) 0.083% nebulizer solution 2.5 mg (has no administration in time range)  enoxaparin (LOVENOX) injection 40 mg (has no administration in time range)  iohexol (OMNIPAQUE)  300 MG/ML solution 75 mL (75 mLs Intravenous Contrast Given 08/18/22 1402)     IMPRESSION / MDM / ASSESSMENT AND PLAN / ED COURSE  I reviewed the triage vital signs and the nursing notes.   Differential diagnosis includes, but is not limited to, pneumonia, atypical infection, COVID, influenza, URI, community-acquired pneumonia, fungal infection, Legionella.  I reviewed the patient's chart.  Patient went to urgent care on 08/13/2022 for fever and runny nose and cough.  He was swabbed, and he was prescribed benzonatate for cough suppression.  Prior to that, he was seen by Dr. Raul Del with pulmonology on 06/12/2022 for what appeared to be multilobar airspace consolidation with associated hypermetabolism, and it was suspected an atypical or fungal infection.  He had a CTA on 04/19/2022 that was negative for pulmonary embolism, though redemonstrated this multifocal pneumonia.  Patient is awake and alert, hemodynamically stable and currently afebrile, though was hypoxic on arrival to 87% on room air.  He required 3 to 4 L of oxygen to keep him at 92%.  He was placed on the cardiac monitor.  Patient reports that he is normally at 98%.  He does not use oxygen at home.  X-ray obtained immediately demonstrates multifocal pneumonia, worsened from previous.  Given his reported temperatures at home, sepsis order set was initiated.  He was found to have leukocytosis to 11.  Upon further history gathering, patient reports that he has lived in numerous places including [place], [place], [place],  [place], in [place].  Other etiologies including histoplasmosis and blastomycosis were considered.  However, he was started on broad-spectrum antibiotics until further information is gathered.  CT scan with contrast was also obtained for further characterization of his lung masses, which demonstrates patchy infiltrates in both lungs, though worse in the right lung with multiple noncalcified nodules suggestive of possible multifocal pneumonia or underlying neoplastic process with a right pleural effusion.  He does not have any pleurisy, clinical signs or symptoms of DVT, history of DVT or PE to suggest pulmonary embolism.  Other etiologies for fever were considered including mosquito borne illnesses given his recent river cruise, however he reports that he was not bit by any mosquitoes.  He also reports that his symptoms began well before the cruise. I do think that he requires admission to the hospitalist for further evaluation and management given his new hypoxia and his undifferentiated multifocal pneumonia.  Patient and his wife are in agreement with this plan.  Patient was accepted by the hospitalist Dr. Blaine Hamper.  Patient was discussed with Dr. Corky Downs who agrees with assessment and plan.  Patient's presentation is most consistent with acute presentation with potential threat to life or bodily function.    FINAL CLINICAL IMPRESSION(S) / ED DIAGNOSES   Final diagnoses:  Hypoxia  Multifocal pneumonia     Rx / DC Orders   ED Discharge Orders     None        Note:  This document was prepared using Dragon voice recognition software and may include unintentional dictation errors.   Emeline Gins 08/18/22 1441    Lavonia Drafts, MD 08/18/22 [phone]

## 2022-08-18 NOTE — Consult Note (Signed)
NAME: Grant Young  DOB: 1945-11-03  MRN: [id number]  Date/Time: 08/18/2022 2:17 PM  REQUESTING PROVIDER: Dr.Niu Subjective:  REASON FOR CONSULT: multifocal pneumonia ? Grant Young is a 76 y.o. with a history of HTN, Chronic pain syndrome, occipital neuralgia, chronic cough since June 2023 Initally presented to the ED on 11/30 with fever  and cough x 2 weeks RSV. Flu. Covid neg.Back again to the ED as fever and worsening cough and sob.  . Pt since June 2023 has had cough and sob.was in the hospital for multifocal pneumonia tand improved to some extent only to flare up intermittently- seen Pulmonologist at [facility] clinic ad had PET scan ti r/o malignancy. The past 3 weeks he has had symptoms and since then he had been on  a [place] 11[place]17-11/27. He says he had a fever of 103 last week. No hemoptysis.No chest pain Lives with wife. No sick contacts that he is aware of.   Past Medical History:  Diagnosis Date   Anxiety    Per New Patient Packet   Chronic back pain    Per New Patient Packet   Chronic heart disease    Per New Patient Packet   Depression    GERD (gastroesophageal reflux disease)    Headache    History of gastritis    Per New Patient Packet   History of neuropathy    Per New Patient Packet   Hypertension    Per New Patient Packet   Insomnia    Kidney stone    Lumbar radicular pain    Per New Patient Packet   Lyme disease    Per New Patient Packet   Myocardial infarction [facility])    Overactive bladder    PONV (postoperative nausea and vomiting)    Skin cancer, basal cell    Spinal cord stimulator status    01/08/21 - not currently using.   Squamous cell skin cancer    Substance abuse [facility])     Past Surgical History:  Procedure Laterality Date   BACK SURGERY     CARDIAC CATHETERIZATION     CATARACT EXTRACTION W/PHACO Left 01/14/2021   Procedure: CATARACT EXTRACTION PHACO AND INTRAOCULAR LENS PLACEMENT ([facility]) LEFT VIVITY TORIC LENS 8.75 00:56.7;   Surgeon: Birder Robson, MD;  Location: [place];  Service: Ophthalmology;  Laterality: Left;   CATARACT EXTRACTION W/PHACO Right 01/28/2021   Procedure: CATARACT EXTRACTION PHACO AND INTRAOCULAR LENS PLACEMENT ([place]) RIGHT VIVITY TORIC LENS;  Surgeon: Birder Robson, MD;  Location: [place];  Service: Ophthalmology;  Laterality: Right;  6.54 00:46.4   CHOLECYSTECTOMY  2017   COLONOSCOPY  09/15/2015   Per New Patient Packet   COLONOSCOPY WITH PROPOFOL N/A 10/03/2020   Procedure: COLONOSCOPY WITH PROPOFOL;  Surgeon: [name], MD;  Location: [facility] ENDOSCOPY;  Service: Gastroenterology;  Laterality: N/A;   CORONARY/GRAFT ACUTE MI REVASCULARIZATION N/A 11/27/2021   Procedure: Coronary/Graft Acute MI Revascularization;  Surgeon: Leonie Man, MD;  Location: [place] CV LAB;  Service: Cardiovascular;  Laterality: N/A;   ESOPHAGOGASTRODUODENOSCOPY (EGD) WITH PROPOFOL N/A 10/03/2020   Procedure: ESOPHAGOGASTRODUODENOSCOPY (EGD) WITH PROPOFOL;  Surgeon: [name], MD;  Location: [facility] ENDOSCOPY;  Service: Gastroenterology;  Laterality: N/A;   FOOT SURGERY Bilateral 03/18/2020   FOOT SURGERY  08/032021   GALLBLADDER SURGERY  09/15/2015   Gallbladder Removal. Procedure done by Dr.Beverly. Per New Patient Packet   KIDNEY STONE SURGERY  09/14/1980   Too many to count. Per New Patient Packet 09/14/1980-09/15/1995  KIDNEY STONE SURGERY  09/15/1995   Too many to count. Per New Patient Packet   LEFT HEART CATH AND CORONARY ANGIOGRAPHY N/A 11/27/2021   Procedure: LEFT HEART CATH AND CORONARY ANGIOGRAPHY;  Surgeon: Leonie Man, MD;  Location: [place] [facility] LAB;  Service: Cardiovascular;  Laterality: N/A;   LITHOTRIPSY  09/14/2014   Per New Patient Packet   LITHOTRIPSY  09/14/1996   No Stints Used. Per New Patient Packet   LITHOTRIPSY  09/14/1997   No Stints used. Per New Patient Packet   PAIN PUMP IMPLANTATION     PAIN PUMP REMOVAL     SHOULDER SURGERY Right  1984   SIGMOIDOSCOPY  09/14/2017   Per New Patient Packet   SPINAL CORD STIMULATOR IMPLANT     TONSILLECTOMY     UPPER GI ENDOSCOPY      Social History   Socioeconomic History   Marital status: Married    Spouse name: Not on file   Number of children: Not on file   Years of education: Not on file   Highest education level: Not on file  Occupational History   Not on file  Tobacco Use   Smoking status: Never    Passive exposure: Never   Smokeless tobacco: Never  Vaping Use   Vaping Use: Never used  Substance and Sexual Activity   Alcohol use: Yes    Comment: 1 Drink a Month, socially   Drug use: Not Currently   Sexual activity: Yes    Birth control/protection: None  Other Topics Concern   Not on file  Social History Narrative   Tobacco use, amount per day now: None   Past tobacco use, amount per day: None   How many years did you use tobacco: 0   Alcohol use (drinks per week): 0-1 Month   Diet:   Do you drink/eat things with caffeine: Occasionally ( Hot Chocolate and maybe 1/4 of 16oz Pepsi 2-3 times a week.   Marital status: Married                                  What year were you married? 1970   Do you live in a house, apartment, assisted living, condo, trailer, etc.? House   Is it one or more stories? One   How many persons live in your home? 2   Do you have pets in your home?( please list)  No   Highest Level of education completed: Masters   Current or past profession: Intensive Transport planner for [employer]   Do you exercise? Yes                                    Type and how often? [place], Recumbent Bike, 3 times a week. Try to get a 1.2 mile walk at least 3 times a week or more.     Do you have a living will? Yes   Do you have a DNR form?  No                                 If not, do you want to discuss one?   Do you have signed POA/HPOA forms? Yes  If so, please bring to you appointment    Do you have any  difficulty bathing or dressing yourself? No    Do you have difficulty preparing food or eating? No   Do you have difficulty managing your medications? No   Do you have any difficulty managing your finances? No   Do you have any difficulty affording your medications? No         Social Determinants of Radio broadcast assistant Strain: Not on file  Food Insecurity: Not on file  Transportation Needs: Not on file  Physical Activity: Not on file  Stress: Not on file  Social Connections: Not on file  Intimate Partner Violence: Not on file    Family History  Problem Relation Age of Onset   Heart disease Father    Heart failure Father    Hypertension Father    Stroke Father    Stroke Mother    Dementia Mother    Kidney Stones Daughter    Anxiety disorder Daughter    OCD Daughter    Allergies  Allergen Reactions   Ace Inhibitors     Other reaction(s): Cough   Fluoxetine Anxiety    "made me fall asleep" per pt "bad headaches and "makes  Me  Crazy" historical allergy noted in [employer] "made me fall asleep" per pt "bad headaches and "makes  Me  Crazy" Per New Patient Packet.     Metoclopramide     Other reaction(s): Other (See Comments), Other (See Comments), Unknown Tardive Dyskinesia  historical allergy noted in [employer] Tardive Dyskinesia  Per New Patient Packet.   Nalbuphine     Used Post Back surgery- Anesthesiologist Error. Patient had Narcotic Withdraw. Per New Patient Packet.    Other     Other reaction(s): Other (See Comments) Altered mental status in combo with narcotics at previous hospitalization - Full Withdrawal Symptoms Other reaction(s): Rash   Amoxicillin-Pot Clavulanate Nausea Only    Per New Patient Packet.   Doxazosin Rash    Other reaction(s): Other - See Comments, Rash UNKNOWN REACTION UNKNOWN REACTION    Duloxetine Nausea Only    Per New Patient Packet.    Penicillins Nausea Only    Per New Patient Packet.   Tamsulosin Itching and Anxiety     Restless, Flushing, Heavy Chest, Itching, Hyperactive mood and Anxiety. Unable to handle side effects. Per New Patient Packet.     Trazodone And Nefazodone Itching, Anxiety and Rash    Headache. "INCREASED MY ANXIETY AND HEARTRATE" Flushing, tachycardia "INCREASED MY ANXIETY AND HEARTRATE" Per New Patient Packet.    Amlodipine     Shaking, unsure of reaction. Per New Patient Packet.     Cinoxacin     GI Intolerance, and Dizziness. Per New Patient Packet.    Ciprofloxacin     Other reaction(s): Unknown   Fludrocortisone Other (See Comments)    "Worsening headaches, GI issues, fatigue"   Nebivolol     Other reaction(s): Unknown   Olanzapine     Headache and unable to sleep for 3 nights. Per New Patient Packet.    Olmesartan     Other reaction(s): Unknown   Pregabalin     Confusion, Lack of concentration, dizziness, and likely drowsiness. Per New Patient Packet.     Prostaglandins     Other reaction(s): Other (See Comments) Intolerance   Thyroid Hormones     Other reaction(s): Other (See Comments) Thyroid (Nature Thyroid) contraindicated with some of your other medications.   Zolpidem  Nightmares, Ineffective after 2 days. Per New Patient Packet.    Duloxetine Hcl     Other reaction(s): Rash   Fluoxetine Hcl     Other reaction(s): Rash   Nucynta [Tapentadol] Other (See Comments)    Vertigo    Phenytoin Anxiety    Hyperactivity, and Ineffective. Per New Patient Packet.    I? Current Facility-Administered Medications  Medication Dose Route Frequency Provider Last Rate Last Admin   acetaminophen (TYLENOL) tablet 650 mg  650 mg Oral Q6H PRN Ivor Costa, MD       albuterol (PROVENTIL) (2.5 MG/3ML) 0.083% nebulizer solution 2.5 mg  2.5 mg Nebulization Q4H PRN Alison Murray, RPH       azithromycin (ZITHROMAX) 500 mg in sodium chloride 0.9 % 250 mL IVPB  500 mg Intravenous Once Poggi, Jenna E, PA-C 250 mL/hr at 08/18/22 1344 500 mg at 08/18/22 1344   cefTRIAXone  (ROCEPHIN) 2 g in sodium chloride 0.9 % 100 mL IVPB  2 g Intravenous Q24H Poggi, Jenna E, PA-C   Stopped at 08/18/22 1343   dextromethorphan-guaiFENesin (MUCINEX DM) 30-600 MG per 12 hr tablet 1 tablet  1 tablet Oral BID PRN Ivor Costa, MD       enoxaparin (LOVENOX) injection 40 mg  40 mg Subcutaneous Q24H Ivor Costa, MD       hydrALAZINE (APRESOLINE) injection 5 mg  5 mg Intravenous Q2H PRN Ivor Costa, MD       ondansetron [facility]) injection 4 mg  4 mg Intravenous Q8H PRN Ivor Costa, MD       potassium chloride SA (KLOR-CON M) CR tablet 40 mEq  40 mEq Oral Once Ivor Costa, MD       Current Outpatient Medications  Medication Sig Dispense Refill   aspirin 81 MG chewable tablet Chew 1 tablet (81 mg total) by mouth daily. 90 tablet 3   Atogepant 60 MG TABS Take 60 mg by mouth once daily     benzonatate (TESSALON) 100 MG capsule Take 1-2 tablets 3 times a day as needed for cough 30 capsule 0   butalbital-acetaminophen-caffeine (FIORICET) 50-325-40 MG tablet TAKE 1 TABLET BY MOUTH EVERY 6 HOURS AS NEEDED FOR HEADACHE. 90 tablet 0   cetirizine (ZYRTEC) 10 MG tablet Take 10 mg by mouth daily as needed for allergies.     chlorthalidone (HYGROTON) 25 MG tablet Take 1 tablet by mouth daily.     Cholecalciferol 50 MCG (2000 UT) CAPS Take 1 tablet by mouth daily.     clopidogrel (PLAVIX) 75 MG tablet Take 1 tablet (75 mg) by mouth once daily 90 tablet 2   Cyanocobalamin (VITAMIN B-12) 5000 MCG LOZG Take 1 lozenge by mouth daily.      DHEA 25 MG CAPS Take 25 mg by mouth daily.     diazepam (VALIUM) 5 MG tablet TAKE 1 TABLET IN THE MORNING AND 2 TABLETS AT BEDTIME 90 tablet 0   dicyclomine (BENTYL) 10 MG capsule Take 1 capsule (10 mg total) by mouth 4 (four) times daily -  before meals and at bedtime. 90 capsule 1   Digestive Aids Mixture (DIGESTION GB PO) Take 1 capsule by mouth daily.     escitalopram (LEXAPRO) 20 MG tablet TAKE 1 TABLET BY MOUTH EVERY DAY 90 tablet 0   Evolocumab (REPATHA SURECLICK) 983  MG/ML SOAJ Inject 140 mg into the skin every 14 (fourteen) days. 2 mL 11   ezetimibe (ZETIA) 10 MG tablet Take 1 tablet (10 mg total) by mouth daily. [place]  tablet 1   ferrous sulfate 325 (65 FE) MG tablet Take 325 mg by mouth. Once daily on Mondays, Wednesdays and Fridays     [START ON 08/22/2022] HYDROcodone-acetaminophen (NORCO) 10-325 MG tablet Take 1 tablet by mouth every 8 (eight) hours as needed for severe pain. Must last 30 days. 90 tablet 0   Loperamide HCl (IMODIUM PO) Take by mouth as needed.      MAGNESIUM BISGLYCINATE PO Take by mouth daily.     melatonin 5 MG TABS Take 5 mg by mouth at bedtime.     nortriptyline (PAMELOR) 25 MG capsule Take 1 capsule (25 mg total) by mouth at bedtime. 90 capsule 0   polyethylene glycol (MIRALAX / GLYCOLAX) 17 g packet Take 17 g by mouth daily as needed.     Probiotic Product (PROBIOTIC DAILY PO) Take by mouth daily.     prochlorperazine (COMPAZINE) 10 MG tablet Take 1 tablet (10 mg total) by mouth 2 (two) times daily as needed for nausea or vomiting. 180 tablet 0   propranolol ER (INDERAL LA) 60 MG 24 hr capsule Take 1 capsule (60 mg total) by mouth in the morning and at bedtime. 180 capsule 0   pyridostigmine (MESTINON) 60 MG tablet Take 0.5 tablets (30 mg total) by mouth 2 (two) times daily. 90 tablet 0   RABEprazole (ACIPHEX) 20 MG tablet TAKE 1 TABLET BY MOUTH TWICE A DAY 180 tablet 3   Vibegron (GEMTESA) 75 MG TABS Take 75 mg by mouth daily. 90 tablet 3   anastrozole (ARIMIDEX) 1 MG tablet 1/2 tablet (0.5 mg) by mouth once weekly     [START ON 09/21/2022] HYDROcodone-acetaminophen (NORCO) 10-325 MG tablet Take 1 tablet by mouth every 8 (eight) hours as needed for severe pain. Must last 30 days. 90 tablet 0   [START ON 10/21/2022] HYDROcodone-acetaminophen (NORCO) 10-325 MG tablet Take 1 tablet by mouth every 8 (eight) hours as needed for severe pain. Must last 30 days. 90 tablet 0   ipratropium (ATROVENT) 0.06 % nasal spray Place 1 spray into both  nostrils 4 (four) times daily as needed for rhinitis. For up to 5-7 days then stop. 15 mL 0   NONFORMULARY OR COMPOUNDED ITEM Bi-Mix Papaverine '30mg'$ , Phentolamine '1mg'$    Dosage: Inject 1cc per injection    Vial 105m   Qty #5 Refills 6 5 each 6   Nutritional Supplements (NUTRITIONAL SUPPLEMENT PO) Take 1 capsule by mouth daily. [name]     ondansetron (ZOFRAN) 4 MG tablet Take by mouth. (Patient not taking: Reported on 08/18/2022)     sucralfate (CARAFATE) 1 g tablet Take 1 g by mouth 4 (four) times daily as needed.      testosterone cypionate (DEPOTESTOSTERONE CYPIONATE) 200 MG/ML injection Inject 80 mg into the muscle every 14 (fourteen) days.     valACYclovir (VALTREX) 1000 MG tablet Take 2 tabs p.o. and repeat in 12 hours as needed for cold sore 30 tablet 0     Abtx:  Anti-infectives (From admission, onward)    Start     Dose/Rate Route Frequency Ordered Stop   08/18/22 1300  azithromycin (ZITHROMAX) 500 mg in sodium chloride 0.9 % 250 mL IVPB        500 mg 250 mL/hr over 60 Minutes Intravenous  Once 08/18/22 1246     08/18/22 1300  cefTRIAXone (ROCEPHIN) 2 g in sodium chloride 0.9 % 100 mL IVPB        2 g 200 mL/hr over 30 Minutes  Intravenous Every 24 hours 08/18/22 1246         REVIEW OF SYSTEMS:  Const: negative fever, negative chills, negative weight loss Eyes: negative diplopia or visual changes, negative eye pain ENT: negative coryza, negative sore throat Resp: negative cough, hemoptysis, dyspnea Cards: negative for chest pain, palpitations, lower extremity edema GU: negative for frequency, dysuria and hematuria GI: Negative for abdominal pain, diarrhea, bleeding, constipation Skin: negative for rash and pruritus Heme: negative for easy bruising and gum/nose bleeding MS: negative for myalgias, arthralgias, back pain and muscle weakness Neurolo:negative for headaches, dizziness, vertigo, memory problems  Psych: negative for feelings of anxiety,  depression  Endocrine: negative for thyroid, diabetes Allergy/Immunology- negative for any medication or food allergies ? Pertinent Positives include : Objective:  VITALS:  BP 132/87 (BP Location: Right Arm)   Pulse 78   Temp 98.1 F (36.7 C) (Oral)   Resp 20   Ht '5\' 9"'$  (1.753 m)   Wt 63.5 kg   SpO2 92%   BMI 20.67 kg/m  LDA Foley Central line Other drainage tubes PHYSICAL EXAM:  General: Alert, cooperative, no distress, appears stated age.  Head: Normocephalic, without obvious abnormality, atraumatic. Eyes: Conjunctivae clear, anicteric sclerae. Pupils are equal ENT Nares normal. No drainage or sinus tenderness. Lips, mucosa, and tongue normal. No Thrush Neck: Supple, symmetrical, no adenopathy, thyroid: non tender no carotid bruit and no JVD. Back: No CVA tenderness. Lungs: Clear to auscultation bilaterally. No Wheezing or Rhonchi. No rales. Heart: Regular rate and rhythm, no murmur, rub or gallop. Abdomen: Soft, non-tender,not distended. Bowel sounds normal. No masses Extremities: atraumatic, no cyanosis. No edema. No clubbing Skin: No rashes or lesions. Or bruising Lymph: Cervical, supraclavicular normal. Neurologic: Grossly non-focal Pertinent Labs Lab Results CBC    Component Value Date/Time   WBC 11.2 (H) 08/18/2022 1156   RBC 5.37 08/18/2022 1156   HGB 15.1 08/18/2022 1156   HGB 14.4 12/26/2021 1524   HCT 46.3 08/18/2022 1156   HCT 43.5 12/26/2021 1524   PLT 355 08/18/2022 1156   PLT 202 12/26/2021 1524   MCV 86.2 08/18/2022 1156   MCV 90 12/26/2021 1524   MCH 28.1 08/18/2022 1156   MCHC 32.6 08/18/2022 1156   RDW 12.6 08/18/2022 1156   RDW 13.3 12/26/2021 1524   LYMPHSABS 1.2 04/19/2022 0952   MONOABS 1.2 (H) 04/19/2022 0952   EOSABS 0.0 04/19/2022 0952   BASOSABS 0.0 04/19/2022 0952       Latest Ref Rng & Units 08/18/2022   11:56 AM 04/20/2022    5:35 AM 04/19/2022    4:53 PM  CMP  Glucose 70 - 99 mg/dL 115  95    BUN 8 - 23 mg/dL 17  14     Creatinine 0.61 - 1.24 mg/dL 0.75  0.68  0.84   Sodium 135 - 145 mmol/L 136  143    Potassium 3.5 - 5.1 mmol/L 3.1  3.5    Chloride 98 - 111 mmol/L 101  108    CO2 22 - 32 mmol/L 28  26    Calcium 8.9 - 10.3 mg/dL 8.8  9.0    Total Protein 6.5 - 8.1 g/dL 6.6  5.8    Total Bilirubin 0.3 - 1.2 mg/dL 0.8  0.7    Alkaline Phos 38 - 126 U/L 92  94    AST 15 - 41 U/L 21  14    ALT 0 - 44 U/L 19  14        Microbiology:  Recent Results (from the past 240 hour(s))  Resp panel by RT-PCR (RSV, Flu A&B, Covid) Anterior Nasal Swab     Status: None   Collection Time: 08/13/22  3:49 PM   Specimen: Anterior Nasal Swab  Result Value Ref Range Status   SARS Coronavirus 2 by RT PCR NEGATIVE NEGATIVE Final    Comment: (NOTE) SARS-CoV-2 target nucleic acids are NOT DETECTED.  The SARS-CoV-2 RNA is generally detectable in upper respiratory specimens during the acute phase of infection. The lowest concentration of SARS-CoV-2 viral copies this assay can detect is 138 copies/mL. A negative result does not preclude SARS-Cov-2 infection and should not be used as the sole basis for treatment or other patient management decisions. A negative result may occur with  improper specimen collection/handling, submission of specimen other than nasopharyngeal swab, presence of viral mutation(s) within the areas targeted by this assay, and inadequate number of viral copies(<138 copies/mL). A negative result must be combined with clinical observations, patient history, and epidemiological information. The expected result is Negative.  Fact Sheet for Patients:  EntrepreneurPulse.com.au  Fact Sheet for Healthcare Providers:  IncredibleEmployment.be  This test is no t yet approved or cleared by the [place] FDA and  has been authorized for detection and/or diagnosis of SARS-CoV-2 by FDA under an Emergency Use Authorization (EUA). This EUA will remain  in effect (meaning  this test can be used) for the duration of the COVID-19 declaration under Section 564(b)(1) of the Act, 21 U.S.C.section 360bbb-3(b)(1), unless the authorization is terminated  or revoked sooner.       Influenza A by PCR NEGATIVE NEGATIVE Final   Influenza B by PCR NEGATIVE NEGATIVE Final    Comment: (NOTE) The Xpert Xpress SARS-CoV-2/FLU/RSV plus assay is intended as an aid in the diagnosis of influenza from Nasopharyngeal swab specimens and should not be used as a sole basis for treatment. Nasal washings and aspirates are unacceptable for Xpert Xpress SARS-CoV-2/FLU/RSV testing.  Fact Sheet for Patients: EntrepreneurPulse.com.au  Fact Sheet for Healthcare Providers: IncredibleEmployment.be  This test is not yet approved or cleared by the [place] FDA and has been authorized for detection and/or diagnosis of SARS-CoV-2 by FDA under an Emergency Use Authorization (EUA). This EUA will remain in effect (meaning this test can be used) for the duration of the COVID-19 declaration under Section 564(b)(1) of the Act, 21 U.S.C. section 360bbb-3(b)(1), unless the authorization is terminated or revoked.     Resp Syncytial Virus by PCR NEGATIVE NEGATIVE Final    Comment: (NOTE) Fact Sheet for Patients: EntrepreneurPulse.com.au  Fact Sheet for Healthcare Providers: IncredibleEmployment.be  This test is not yet approved or cleared by the [place] FDA and has been authorized for detection and/or diagnosis of SARS-CoV-2 by FDA under an Emergency Use Authorization (EUA). This EUA will remain in effect (meaning this test can be used) for the duration of the COVID-19 declaration under Section 564(b)(1) of the Act, 21 U.S.C. section 360bbb-3(b)(1), unless the authorization is terminated or revoked.  Performed at [facility], [place] [address] [place], [place] [address]   Resp panel by RT-PCR (RSV, Flu  A&B, Covid) Anterior Nasal Swab     Status: None   Collection Time: 08/18/22 11:50 AM   Specimen: Anterior Nasal Swab  Result Value Ref Range Status   SARS Coronavirus 2 by RT PCR NEGATIVE NEGATIVE Final    Comment: (NOTE) SARS-CoV-2 target nucleic acids are NOT DETECTED.  The SARS-CoV-2 RNA is generally detectable in upper respiratory specimens during  the acute phase of infection. The lowest concentration of SARS-CoV-2 viral copies this assay can detect is 138 copies/mL. A negative result does not preclude SARS-Cov-2 infection and should not be used as the sole basis for treatment or other patient management decisions. A negative result may occur with  improper specimen collection/handling, submission of specimen other than nasopharyngeal swab, presence of viral mutation(s) within the areas targeted by this assay, and inadequate number of viral copies(<138 copies/mL). A negative result must be combined with clinical observations, patient history, and epidemiological information. The expected result is Negative.  Fact Sheet for Patients:  EntrepreneurPulse.com.au  Fact Sheet for Healthcare Providers:  IncredibleEmployment.be  This test is no t yet approved or cleared by the [place] FDA and  has been authorized for detection and/or diagnosis of SARS-CoV-2 by FDA under an Emergency Use Authorization (EUA). This EUA will remain  in effect (meaning this test can be used) for the duration of the COVID-19 declaration under Section 564(b)(1) of the Act, 21 U.S.C.section 360bbb-3(b)(1), unless the authorization is terminated  or revoked sooner.       Influenza A by PCR NEGATIVE NEGATIVE Final   Influenza B by PCR NEGATIVE NEGATIVE Final    Comment: (NOTE) The Xpert Xpress SARS-CoV-2/FLU/RSV plus assay is intended as an aid in the diagnosis of influenza from Nasopharyngeal swab specimens and should not be used as a sole basis for treatment.  Nasal washings and aspirates are unacceptable for Xpert Xpress SARS-CoV-2/FLU/RSV testing.  Fact Sheet for Patients: EntrepreneurPulse.com.au  Fact Sheet for Healthcare Providers: IncredibleEmployment.be  This test is not yet approved or cleared by the [place] FDA and has been authorized for detection and/or diagnosis of SARS-CoV-2 by FDA under an Emergency Use Authorization (EUA). This EUA will remain in effect (meaning this test can be used) for the duration of the COVID-19 declaration under Section 564(b)(1) of the Act, 21 U.S.C. section 360bbb-3(b)(1), unless the authorization is terminated or revoked.     Resp Syncytial Virus by PCR NEGATIVE NEGATIVE Final    Comment: (NOTE) Fact Sheet for Patients: EntrepreneurPulse.com.au  Fact Sheet for Healthcare Providers: IncredibleEmployment.be  This test is not yet approved or cleared by the [place] FDA and has been authorized for detection and/or diagnosis of SARS-CoV-2 by FDA under an Emergency Use Authorization (EUA). This EUA will remain in effect (meaning this test can be used) for the duration of the COVID-19 declaration under Section 564(b)(1) of the Act, 21 U.S.C. section [id number](b)(1), unless the authorization is terminated or revoked.  Performed at [facility], [address] [place], [place] [address]     IMAGING RESULTS:  I have personally reviewed the films ?infiltrate r tlung  Impression/Recommendation ?Chronic  cough/sob for 6-8 months CT in aug had shown multifocal infiltrate and rt lower lobe pneumonia Which have improved in the current Ct- followed by pulmonologist as OP HE was investigating for malignancy with PET scan and it was improving  Recent flare up- r/o new viral infection-- will get resp viral PCR panel- procaclcitonin near normal Hold antibiotics Will investigate for fungal and bacterial  infection Also check for autoimmune Had I:160  HTN  HLD  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 Dicussed the management with the patient in detail Discussed with care tam ? ___________________________________________________ Discussed with patient, requesting provider Note:  This document was prepared using Dragon voice recognition software and may include unintentional dictation errors.

## 2022-08-18 NOTE — Consult Note (Signed)
PULMONOLOGY         Date: 08/18/2022,   MRN# [id number] Grant Young July 03, 1946     AdmissionWeight: 63.5 kg                 CurrentWeight: 63.5 kg  Referring provider: Dr Blaine Hamper   CHIEF COMPLAINT:    Recurrent pneumonia  HISTORY OF PRESENT ILLNESS   This is a pleasant 76 yo M known to [name] at [facility] pulmonary with recent evaluation by Dr Raul Del in clinic. He has a background history of anxety disorder, chronic back pain, GERD, nephrolithiasis, HTN, substance abuse who had evaluation for RLL pneumonia and is s/p CT chest with follow up PET scan on outpatient showing RLL hypermetabolic focus with scattered bilateral infiltrates suggestive of infection. His PET scan showed interval improvement and radiologist suspected atypical or possible fungal etiology as most likely culprit.  He had repeat CT chest on this admission and this showed further improvement in RLL opacity, however there is also findings of nodular infiltrates which are apparently not improved. PCCM consult for additional evaluation and management.    PAST MEDICAL HISTORY   Past Medical History:  Diagnosis Date   Anxiety    Per New Patient Packet   Chronic back pain    Per New Patient Packet   Chronic heart disease    Per New Patient Packet   Depression    GERD (gastroesophageal reflux disease)    Headache    History of gastritis    Per New Patient Packet   History of neuropathy    Per New Patient Packet   Hypertension    Per New Patient Packet   Insomnia    Kidney stone    Lumbar radicular pain    Per New Patient Packet   Lyme disease    Per New Patient Packet   Myocardial infarction [facility])    Overactive bladder    PONV (postoperative nausea and vomiting)    Skin cancer, basal cell    Spinal cord stimulator status    01/08/21 - not currently using.   Squamous cell skin cancer    Substance abuse ([place])      SURGICAL HISTORY   Past Surgical History:  Procedure Laterality Date   BACK SURGERY      CARDIAC CATHETERIZATION     CATARACT EXTRACTION W/PHACO Left 01/14/2021   Procedure: CATARACT EXTRACTION PHACO AND INTRAOCULAR LENS PLACEMENT ([place]) LEFT VIVITY TORIC LENS 8.75 00:56.7;  Surgeon: Birder Robson, MD;  Location: [place];  Service: Ophthalmology;  Laterality: Left;   CATARACT EXTRACTION W/PHACO Right 01/28/2021   Procedure: CATARACT EXTRACTION PHACO AND INTRAOCULAR LENS PLACEMENT ([place]) RIGHT VIVITY TORIC LENS;  Surgeon: Birder Robson, MD;  Location: [place];  Service: Ophthalmology;  Laterality: Right;  6.54 00:46.4   CHOLECYSTECTOMY  2017   COLONOSCOPY  09/15/2015   Per New Patient Packet   COLONOSCOPY WITH PROPOFOL N/A 10/03/2020   Procedure: COLONOSCOPY WITH PROPOFOL;  Surgeon: [name], MD;  Location: [facility] ENDOSCOPY;  Service: Gastroenterology;  Laterality: N/A;   CORONARY/GRAFT ACUTE MI REVASCULARIZATION N/A 11/27/2021   Procedure: Coronary/Graft Acute MI Revascularization;  Surgeon: Leonie Man, MD;  Location: [place] [facility];  Service: Cardiovascular;  Laterality: N/A;   ESOPHAGOGASTRODUODENOSCOPY (EGD) WITH PROPOFOL N/A 10/03/2020   Procedure: ESOPHAGOGASTRODUODENOSCOPY (EGD) WITH PROPOFOL;  Surgeon: [name], MD;  Location: [facility] ENDOSCOPY;  Service: Gastroenterology;  Laterality: N/A;   FOOT SURGERY Bilateral 03/18/2020   FOOT SURGERY  08/032021  GALLBLADDER SURGERY  09/15/2015   Gallbladder Removal. Procedure done by Dr.Beverly. Per New Patient Packet   KIDNEY STONE SURGERY  09/14/1980   Too many to count. Per New Patient Packet 09/14/1980-09/15/1995   KIDNEY STONE SURGERY  09/15/1995   Too many to count. Per New Patient Packet   LEFT HEART CATH AND CORONARY ANGIOGRAPHY N/A 11/27/2021   Procedure: LEFT HEART CATH AND CORONARY ANGIOGRAPHY;  Surgeon: Leonie Man, MD;  Location: [place] [facility];  Service: Cardiovascular;  Laterality: N/A;   LITHOTRIPSY  09/14/2014   Per New Patient Packet   LITHOTRIPSY  09/14/1996    No Stints Used. Per New Patient Packet   LITHOTRIPSY  09/14/1997   No Stints used. Per New Patient Packet   PAIN PUMP IMPLANTATION     PAIN PUMP REMOVAL     SHOULDER SURGERY Right 1984   SIGMOIDOSCOPY  09/14/2017   Per New Patient Packet   SPINAL CORD STIMULATOR IMPLANT     TONSILLECTOMY     UPPER GI ENDOSCOPY       FAMILY HISTORY   Family History  Problem Relation Age of Onset   Heart disease Father    Heart failure Father    Hypertension Father    Stroke Father    Stroke Mother    Dementia Mother    Kidney Stones Daughter    Anxiety disorder Daughter    OCD Daughter      SOCIAL HISTORY   Social History   Tobacco Use   Smoking status: Never    Passive exposure: Never   Smokeless tobacco: Never  Vaping Use   Vaping Use: Never used  Substance Use Topics   Alcohol use: Yes    Comment: 1 Drink a Month, socially   Drug use: Not Currently     MEDICATIONS    Home Medication:  Current Outpatient Rx   Order #: [id number] Class: Normal   Order #: [id number] Class: Historical Med   Order #: [id number] Class: Normal   Order #: [id number] Class: Normal   Order #: [id number] Class: Historical Med   Order #: [id number] Class: Historical Med   Order #: [id number] Class: Historical Med   Order #: [id number] Class: Normal   Order #: [id number] Class: Historical Med   Order #: [id number] Class: Historical Med   Order #: [id number] Class: Normal   Order #: [id number] Class: Normal   Order #: [id number] Class: Historical Med   Order #: [id number] Class: Normal   Order #: [id number] Class: Normal   Order #: [id number] Class: Normal   Order #: [id number] Class: Historical Med   [START ON 08/22/2022] Order #: [id number] Class: Normal   Order #: [id number] Class: Historical Med   Order #: [id number] Class: Historical Med   Order #: [id number] Class: Historical Med   Order #: [id number] Class: Normal   Order #: [id number] Class: Historical Med   Order #: [id number] Class: Historical Med   Order #:  [id number] Class: Normal   Order #: [id number] Class: Normal   Order #: [id number] Class: Normal   Order #: [id number] Class: Normal   Order #: [id number] Class: Normal   Order #: [id number] Class: Historical Med   [START ON 09/21/2022] Order #: [id number] Class: Normal   [START ON 10/21/2022] Order #: [id number] Class: Normal   Order #: [id number] Class: Normal   Order #: [id number] Class: Fax   Order #: [id number] Class: Historical Med   Order #: [id number] Class: Historical Med   Order #: [id number] Class: Historical Med   Order #: [id number] Class: Historical Med   Order #: [id number] Class: Normal    Current Medication:  Current Facility-Administered Medications:    acetaminophen (TYLENOL) tablet 650 mg,  650 mg, Oral, Q6H PRN, Ivor Costa, MD   albuterol (PROVENTIL) (2.5 MG/3ML) 0.083% nebulizer solution 2.5 mg, 2.5 mg, Nebulization, Q4H PRN, Alison Murray, RPH   dextromethorphan-guaiFENesin (MUCINEX DM) 30-600 MG per 12 hr tablet 1 tablet, 1 tablet, Oral, BID PRN, Ivor Costa, MD   enoxaparin (LOVENOX) injection 40 mg, 40 mg, Subcutaneous, Q24H, Ivor Costa, MD   hydrALAZINE (APRESOLINE) injection 5 mg, 5 mg, Intravenous, Q2H PRN, Ivor Costa, MD   ondansetron [facility]) injection 4 mg, 4 mg, Intravenous, Q8H PRN, Ivor Costa, MD   potassium chloride SA (KLOR-CON M) CR tablet 40 mEq, 40 mEq, Oral, Once, Ivor Costa, MD  Current Outpatient Medications:    aspirin 81 MG chewable tablet, Chew 1 tablet (81 mg total) by mouth daily., Disp: 90 tablet, Rfl: 3   Atogepant 60 MG TABS, Take 60 mg by mouth once daily, Disp: , Rfl:    benzonatate (TESSALON) 100 MG capsule, Take 1-2 tablets 3 times a day as needed for cough, Disp: 30 capsule, Rfl: 0   butalbital-acetaminophen-caffeine (FIORICET) 50-325-40 MG tablet, TAKE 1 TABLET BY MOUTH EVERY 6 HOURS AS NEEDED FOR HEADACHE., Disp: 90 tablet, Rfl: 0   cetirizine (ZYRTEC) 10 MG tablet, Take 10 mg by mouth daily as needed for allergies., Disp: , Rfl:    chlorthalidone (HYGROTON)  25 MG tablet, Take 1 tablet by mouth daily., Disp: , Rfl:    Cholecalciferol 50 MCG (2000 UT) CAPS, Take 1 tablet by mouth daily., Disp: , Rfl:    clopidogrel (PLAVIX) 75 MG tablet, Take 1 tablet (75 mg) by mouth once daily, Disp: 90 tablet, Rfl: 2   Cyanocobalamin (VITAMIN B-12) 5000 MCG LOZG, Take 1 lozenge by mouth daily. , Disp: , Rfl:    DHEA 25 MG CAPS, Take 25 mg by mouth daily., Disp: , Rfl:    diazepam (VALIUM) 5 MG tablet, TAKE 1 TABLET IN THE MORNING AND 2 TABLETS AT BEDTIME, Disp: 90 tablet, Rfl: 0   dicyclomine (BENTYL) 10 MG capsule, Take 1 capsule (10 mg total) by mouth 4 (four) times daily -  before meals and at bedtime., Disp: 90 capsule, Rfl: 1   Digestive Aids Mixture (DIGESTION GB PO), Take 1 capsule by mouth daily., Disp: , Rfl:    escitalopram (LEXAPRO) 20 MG tablet, TAKE 1 TABLET BY MOUTH EVERY DAY, Disp: 90 tablet, Rfl: 0   Evolocumab (REPATHA SURECLICK) 500 MG/ML SOAJ, Inject 140 mg into the skin every 14 (fourteen) days., Disp: 2 mL, Rfl: 11   ezetimibe (ZETIA) 10 MG tablet, Take 1 tablet (10 mg total) by mouth daily., Disp: 90 tablet, Rfl: 1   ferrous sulfate 325 (65 FE) MG tablet, Take 325 mg by mouth. Once daily on Mondays, Wednesdays and Fridays, Disp: , Rfl:    [START ON 08/22/2022] HYDROcodone-acetaminophen (NORCO) 10-325 MG tablet, Take 1 tablet by mouth every 8 (eight) hours as needed for severe pain. Must last 30 days., Disp: 90 tablet, Rfl: 0   Loperamide HCl (IMODIUM PO), Take by mouth as needed. , Disp: , Rfl:    MAGNESIUM BISGLYCINATE PO, Take by mouth daily., Disp: , Rfl:    melatonin 5 MG TABS, Take 5 mg by mouth at bedtime., Disp: , Rfl:    nortriptyline (PAMELOR) 25 MG capsule, Take 1 capsule (25 mg total) by mouth at bedtime., Disp: 90 capsule, Rfl: 0   polyethylene glycol (MIRALAX / GLYCOLAX) 17 g packet, Take 17 g by mouth daily as needed., Disp: , Rfl:    Probiotic  Product (PROBIOTIC DAILY PO), Take by mouth daily., Disp: , Rfl:    prochlorperazine  (COMPAZINE) 10 MG tablet, Take 1 tablet (10 mg total) by mouth 2 (two) times daily as needed for nausea or vomiting., Disp: 180 tablet, Rfl: 0   propranolol ER (INDERAL LA) 60 MG 24 hr capsule, Take 1 capsule (60 mg total) by mouth in the morning and at bedtime., Disp: 180 capsule, Rfl: 0   pyridostigmine (MESTINON) 60 MG tablet, Take 0.5 tablets (30 mg total) by mouth 2 (two) times daily., Disp: 90 tablet, Rfl: 0   RABEprazole (ACIPHEX) 20 MG tablet, TAKE 1 TABLET BY MOUTH TWICE A DAY, Disp: 180 tablet, Rfl: 3   Vibegron (GEMTESA) 75 MG TABS, Take 75 mg by mouth daily., Disp: 90 tablet, Rfl: 3   anastrozole (ARIMIDEX) 1 MG tablet, 1/2 tablet (0.5 mg) by mouth once weekly, Disp: , Rfl:    [START ON 09/21/2022] HYDROcodone-acetaminophen (NORCO) 10-325 MG tablet, Take 1 tablet by mouth every 8 (eight) hours as needed for severe pain. Must last 30 days., Disp: 90 tablet, Rfl: 0   [START ON 10/21/2022] HYDROcodone-acetaminophen (NORCO) 10-325 MG tablet, Take 1 tablet by mouth every 8 (eight) hours as needed for severe pain. Must last 30 days., Disp: 90 tablet, Rfl: 0   ipratropium (ATROVENT) 0.06 % nasal spray, Place 1 spray into both nostrils 4 (four) times daily as needed for rhinitis. For up to 5-7 days then stop., Disp: 15 mL, Rfl: 0   NONFORMULARY OR COMPOUNDED ITEM, Bi-Mix Papaverine 63m, Phentolamine 187m  Dosage: Inject 1cc per injection    Vial 83m2m Qty #5 Refills 6, Disp: 5 each, Rfl: 6   Nutritional Supplements (NUTRITIONAL SUPPLEMENT PO), Take 1 capsule by mouth daily. [name], Disp: , Rfl:    ondansetron (ZOFRAN) 4 MG tablet, Take by mouth. (Patient not taking: Reported on 08/18/2022), Disp: , Rfl:    sucralfate (CARAFATE) 1 g tablet, Take 1 g by mouth 4 (four) times daily as needed. , Disp: , Rfl:    testosterone cypionate (DEPOTESTOSTERONE CYPIONATE) 200 MG/ML injection, Inject 80 mg into the muscle every 14 (fourteen) days., Disp: , Rfl:    valACYclovir (VALTREX) 1000 MG tablet,  Take 2 tabs p.o. and repeat in 12 hours as needed for cold sore, Disp: 30 tablet, Rfl: 0    ALLERGIES   Ace inhibitors, Fluoxetine, Metoclopramide, Nalbuphine, Other, Amoxicillin-pot clavulanate, Doxazosin, Duloxetine, Penicillins, Tamsulosin, Trazodone and nefazodone, Amlodipine, Cinoxacin, Ciprofloxacin, Fludrocortisone, Nebivolol, Olanzapine, Olmesartan, Pregabalin, Prostaglandins, Thyroid hormones, Zolpidem, Duloxetine hcl, Fluoxetine hcl, Nucynta [tapentadol], and Phenytoin     REVIEW OF SYSTEMS    Review of Systems:  Gen:  Denies  fever, sweats, chills weigh loss  HEENT: Denies blurred vision, double vision, ear pain, eye pain, hearing loss, nose bleeds, sore throat Cardiac:  No dizziness, chest pain or heaviness, chest tightness,edema Resp:   reports dyspnea chronically  Gi: Denies swallowing difficulty, stomach pain, nausea or vomiting, diarrhea, constipation, bowel incontinence Gu:  Denies bladder incontinence, burning urine Ext:   Denies Joint pain, stiffness or swelling Skin: Denies  skin rash, easy bruising or bleeding or hives Endoc:  Denies polyuria, polydipsia , polyphagia or weight change Psych:   Denies depression, insomnia or hallucinations   Other:  All other systems negative   VS: BP 132/87 (BP Location: Right Arm)   Pulse 78   Temp 98.1 F (36.7 C) (Oral)   Resp 20   Ht _0  (1.753 m)  Wt 63.5 kg   SpO2 92%   BMI 20.67 kg/m      PHYSICAL EXAM    GENERAL:NAD, no fevers, chills, no weakness no fatigue HEAD: Normocephalic, atraumatic.  EYES: Pupils equal, round, reactive to light. Extraocular muscles intact. No scleral icterus.  MOUTH: Moist mucosal membrane. Dentition intact. No abscess noted.  EAR, NOSE, THROAT: Clear without exudates. No external lesions.  NO THRUSH NECK: Supple. No thyromegaly. No nodules. No JVD.  PULMONARY: decreased breath sounds with mild rhonchi worse at bases bilaterally.  CARDIOVASCULAR: S1 and S2. Regular rate and  rhythm. No murmurs, rubs, or gallops. No edema. Pedal pulses 2+ bilaterally.  GASTROINTESTINAL: Soft, nontender, nondistended. No masses. Positive bowel sounds. No hepatosplenomegaly.  MUSCULOSKELETAL: No swelling, clubbing, or edema. Range of motion full in all extremities.  NEUROLOGIC: Cranial nerves II through XII are intact. No gross focal neurological deficits. Sensation intact. Reflexes intact.  SKIN: No ulceration, lesions, rashes, or cyanosis. Skin warm and dry. Turgor intact. NO RASH  PSYCHIATRIC: Mood, affect within normal limits. The patient is awake, alert and oriented x 3. Insight, judgment intact.       IMAGING     ASSESSMENT/PLAN    Multifocal pneumonia - present on admission  - COVID19 negative  - supplemental O2 during my evaluation - room air - will perform infectious workup for pneumonia -serum fungitell -legionella ab -strep pneumoniae ur AG -Histoplasma Ur Ag -sputum resp cultures -AFB sputum expectorated specimen -sputum cytology  -reviewed pertinent imaging with patient today - ESR/CRP -PT/OT for d/c planning  -appreciate ID consultation  -please encourage patient to use incentive spirometer few times each hour while hospitalized.               Thank you for allowing me to participate in the care of this patient.   Patient/Family are satisfied with care plan and all questions have been answered.    Provider disclosure: Patient with at least one acute or chronic illness or injury that poses a threat to life or bodily function and is being managed actively during this encounter.  All of the below services have been performed independently by signing provider:  review of prior documentation from internal and or external health records.  Review of previous and current lab results.  Interview and comprehensive assessment during patient visit today. Review of current and previous chest radiographs/CT scans. Discussion of management and test  interpretation with health care team and patient/family.   This document was prepared using Dragon voice recognition software and may include unintentional dictation errors.     Ottie Glazier, M.D.  Division of Pulmonary & Critical Care Medicine

## 2022-08-18 NOTE — ED Notes (Signed)
Pt in bed, blood and blood cultures obtained, first set obtained during IV start from L forearm, set number two via 21 gauge butterfly from R hand. Pt to x ray

## 2022-08-18 NOTE — H&P (Signed)
History and Physical    Grant Young DOB: 01-11-1946 DOA: 08/18/2022  Referring MD/NP/PA:   PCP: [name], NP   Patient coming from:  The patient is coming from home.  At baseline, pt is independent for most of ADL.        Chief Complaint: SOB  HPI: Grant Young is a 76 y.o. male with medical history significant of hypertension, hyperlipidemia, depression with anxiety, s/p of spinal cord stimulator, CAD with stent placement, chronic pain, IBS, RLS, migraine, who presents just short of breath.  Patient states that he has cough and shortness of breath since 02/2022.  It has been going on intermittently.  Patient was seen by pulmonologist, Dr. Raul Del 9/29.  Patient had CT-chest which showed multi lobar airspace consolidation with associated hypermetabolism, An infectious etiology, namely atypical/fungal, strongly was suspected. He reports that he went to [place] on a river cruise from 11/17 until 11/27.  He has developed fever with a Tmax of 103 F last week, as well as a couple of days ago.  Patient continues to have SOB and cough with clear mucus production.  Currently no fever or chills.  Denies chest pain.  Patient does not have nausea vomiting or abdominal pain.  Per his wife, patient takes MiraLAX, causing loose stool sometimes.  No symptoms of UTI  CHEST Scan as outpt showed 05/2022: Mild right hilar hypermetabolism, SUV max 2.0. Correlation with CT is challenging without IV contrast. Hypermetabolic nodular and masslike areas of consolidation in the lungs measure up to 2.7 x 3.7 cm in the posterior right lower lobe, SUV max 6.4. Largest lesion has decreased in size in the interval, previously measuring 3.2 x 4.9 cm. Similarly, there has been some improvement in right upper lobe consolidation and bilateral nodularity, some of which is also hypermetabolic.  Data reviewed independently and ED Course: pt was found to have WBC 11.2, lactic acid 1.2, troponin 11,  negative COVID PCR, GFR> 60, potassium 3.1, temperature normal, blood pressure 132/87, heart rate 79, RR 20, oxygen saturation 87% on room air, which improved to 92-95% on 3 L oxygen.  Patient is admitted to [facility] bed as inpatient.  Dr. Delaine Lame of infectious disease and Dr. Lanney Gins of pulmonology are consulted.  CT-chest today: Patchy infiltrates are seen in both lungs, more so in the right lung. There are multiple noncalcified nodules of varying sizes in both lungs with interval increase in number and size. There is interval decrease in size of mass like infiltrate in the posterior right lower lobe. Findings may suggest worsening multifocal pneumonia. Possibility of underlying neoplastic process is not excluded. Short-term follow-up CT in 3 months should be considered.   There is minimal right pleural effusion. Coronary artery calcifications are seen.   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 Recommend annual imaging followup by CTA or MRA. This recommendation follows 2010 ACCF/AHA/AATS/ACR/ASA/SCA/SCAI/SIR/STS/SVM Guidelines for the Diagnosis and Management of Patients with Thoracic Aortic Disease. Circulation. 2010; 121: F163-W466. Aortic aneurysm NOS (ICD10-I71.9)   Small hiatal hernia. Other findings as described in the body of the report.   EKG: I have personally reviewed.  Sinus rhythm, QTc 468, left atrial enlargement, left axis deviation, early R wave progression   Review of Systems:   General: no fevers, chills, no body weight gain, has poor appetite, has fatigue HEENT: no blurry vision, hearing changes or sore throat Respiratory: has dyspnea, coughing, no wheezing CV: no chest pain, no palpitations GI: no nausea, vomiting,  abdominal pain, has loose stool, no constipation GU: no dysuria, burning on urination, increased urinary frequency, hematuria  Ext: no leg edema Neuro: no unilateral weakness, numbness, or tingling, no vision change or  hearing loss Skin: no rash, no skin tear. MSK: No muscle spasm, no deformity, no limitation of range of movement in spin Heme: No easy bruising.  Travel history: No recent long distant travel.   Allergy:  Allergies  Allergen Reactions   Ace Inhibitors     Other reaction(s): Cough   Fluoxetine Anxiety    "made me fall asleep" per pt "bad headaches and "makes  Me  Crazy" historical allergy noted in [employer] "made me fall asleep" per pt "bad headaches and "makes  Me  Crazy" Per New Patient Packet.     Metoclopramide     Other reaction(s): Other (See Comments), Other (See Comments), Unknown Tardive Dyskinesia  historical allergy noted in [employer] Tardive Dyskinesia  Per New Patient Packet.   Nalbuphine     Used Post Back surgery- Anesthesiologist Error. Patient had Narcotic Withdraw. Per New Patient Packet.    Other     Other reaction(s): Other (See Comments) Altered mental status in combo with narcotics at previous hospitalization - Full Withdrawal Symptoms Other reaction(s): Rash   Amoxicillin-Pot Clavulanate Nausea Only    Per New Patient Packet.   Doxazosin Rash    Other reaction(s): Other - See Comments, Rash UNKNOWN REACTION UNKNOWN REACTION    Duloxetine Nausea Only    Per New Patient Packet.    Penicillins Nausea Only    Per New Patient Packet.   Tamsulosin Itching and Anxiety    Restless, Flushing, Heavy Chest, Itching, Hyperactive mood and Anxiety. Unable to handle side effects. Per New Patient Packet.     Trazodone And Nefazodone Itching, Anxiety and Rash    Headache. "INCREASED MY ANXIETY AND HEARTRATE" Flushing, tachycardia "INCREASED MY ANXIETY AND HEARTRATE" Per New Patient Packet.    Amlodipine     Shaking, unsure of reaction. Per New Patient Packet.     Cinoxacin     GI Intolerance, and Dizziness. Per New Patient Packet.    Ciprofloxacin     Other reaction(s): Unknown   Fludrocortisone Other (See Comments)    "Worsening headaches, GI issues,  fatigue"   Nebivolol     Other reaction(s): Unknown   Olanzapine     Headache and unable to sleep for 3 nights. Per New Patient Packet.    Olmesartan     Other reaction(s): Unknown   Pregabalin     Confusion, Lack of concentration, dizziness, and likely drowsiness. Per New Patient Packet.     Prostaglandins     Other reaction(s): Other (See Comments) Intolerance   Thyroid Hormones     Other reaction(s): Other (See Comments) Thyroid (Nature Thyroid) contraindicated with some of your other medications.   Zolpidem     Nightmares, Ineffective after 2 days. Per New Patient Packet.    Duloxetine Hcl     Other reaction(s): Rash   Fluoxetine Hcl     Other reaction(s): Rash   Nucynta [Tapentadol] Other (See Comments)    Vertigo    Phenytoin Anxiety    Hyperactivity, and Ineffective. Per New Patient Packet.     Past Medical History:  Diagnosis Date   Anxiety    Per New Patient Packet   Chronic back pain    Per New Patient Packet   Chronic heart disease    Per New Patient Packet   Depression  GERD (gastroesophageal reflux disease)    Headache    History of gastritis    Per New Patient Packet   History of neuropathy    Per New Patient Packet   Hypertension    Per New Patient Packet   Insomnia    Kidney stone    Lumbar radicular pain    Per New Patient Packet   Lyme disease    Per New Patient Packet   Myocardial infarction [facility])    Overactive bladder    PONV (postoperative nausea and vomiting)    Skin cancer, basal cell    Spinal cord stimulator status    01/08/21 - not currently using.   Squamous cell skin cancer    Substance abuse [facility])     Past Surgical History:  Procedure Laterality Date   BACK SURGERY     CARDIAC CATHETERIZATION     CATARACT EXTRACTION W/PHACO Left 01/14/2021   Procedure: CATARACT EXTRACTION PHACO AND INTRAOCULAR LENS PLACEMENT (I[facility]) LEFT VIVITY TORIC LENS 8.75 00:56.7;  Surgeon: Birder Robson, MD;  Location: [place];   Service: Ophthalmology;  Laterality: Left;   CATARACT EXTRACTION W/PHACO Right 01/28/2021   Procedure: CATARACT EXTRACTION PHACO AND INTRAOCULAR LENS PLACEMENT ([place]) RIGHT VIVITY TORIC LENS;  Surgeon: Birder Robson, MD;  Location: [place];  Service: Ophthalmology;  Laterality: Right;  6.54 00:46.4   CHOLECYSTECTOMY  2017   COLONOSCOPY  09/15/2015   Per New Patient Packet   COLONOSCOPY WITH PROPOFOL N/A 10/03/2020   Procedure: COLONOSCOPY WITH PROPOFOL;  Surgeon: [name], MD;  Location: [facility] ENDOSCOPY;  Service: Gastroenterology;  Laterality: N/A;   CORONARY/GRAFT ACUTE MI REVASCULARIZATION N/A 11/27/2021   Procedure: Coronary/Graft Acute MI Revascularization;  Surgeon: Leonie Man, MD;  Location: [place] CV LAB;  Service: Cardiovascular;  Laterality: N/A;   ESOPHAGOGASTRODUODENOSCOPY (EGD) WITH PROPOFOL N/A 10/03/2020   Procedure: ESOPHAGOGASTRODUODENOSCOPY (EGD) WITH PROPOFOL;  Surgeon: [name], MD;  Location: [facility] ENDOSCOPY;  Service: Gastroenterology;  Laterality: N/A;   FOOT SURGERY Bilateral 03/18/2020   FOOT SURGERY  08/032021   GALLBLADDER SURGERY  09/15/2015   Gallbladder Removal. Procedure done by Dr.Beverly. Per New Patient Packet   KIDNEY STONE SURGERY  09/14/1980   Too many to count. Per New Patient Packet 09/14/1980-09/15/1995   KIDNEY STONE SURGERY  09/15/1995   Too many to count. Per New Patient Packet   LEFT HEART CATH AND CORONARY ANGIOGRAPHY N/A 11/27/2021   Procedure: LEFT HEART CATH AND CORONARY ANGIOGRAPHY;  Surgeon: Leonie Man, MD;  Location: [place] CV LAB;  Service: Cardiovascular;  Laterality: N/A;   LITHOTRIPSY  09/14/2014   Per New Patient Packet   LITHOTRIPSY  09/14/1996   No Stints Used. Per New Patient Packet   LITHOTRIPSY  09/14/1997   No Stints used. Per New Patient Packet   PAIN PUMP IMPLANTATION     PAIN PUMP REMOVAL     SHOULDER SURGERY Right 1984   SIGMOIDOSCOPY  09/14/2017   Per New Patient Packet    SPINAL CORD STIMULATOR IMPLANT     TONSILLECTOMY     UPPER GI ENDOSCOPY      Social History:  reports that he has never smoked. He has never been exposed to tobacco smoke. He has never used smokeless tobacco. He reports current alcohol use. He reports that he does not currently use drugs.  Family History:  Family History  Problem Relation Age of Onset   Heart disease Father    Heart failure Father    Hypertension Father  Stroke Father    Stroke Mother    Dementia Mother    Kidney Stones Daughter    Anxiety disorder Daughter    OCD Daughter      Prior to Admission medications   Medication Sig Start Date End Date Taking? Authorizing Provider  anastrozole (ARIMIDEX) 1 MG tablet 1/2 tablet (0.5 mg) by mouth once weekly 05/07/21   [provider]  aspirin 81 MG chewable tablet Chew 1 tablet (81 mg total) by mouth daily. 12/01/21 12/01/22  Val Riles, MD  Atogepant 60 MG TABS Take 60 mg by mouth once daily 07/07/22   [provider]  benzonatate (TESSALON) 100 MG capsule Take 1-2 tablets 3 times a day as needed for cough 08/13/22   Immordino, Stephen, FNP  butalbital-acetaminophen-caffeine (FIORICET) 50-325-40 MG tablet TAKE 1 TABLET BY MOUTH EVERY 6 HOURS AS NEEDED FOR HEADACHE. 07/07/22   [name], NP  cetirizine (ZYRTEC) 10 MG tablet Take 10 mg by mouth daily as needed for allergies.    [provider]  chlorthalidone (HYGROTON) 25 MG tablet Take 1 tablet by mouth daily.    [provider]  Cholecalciferol 50 MCG (2000 UT) CAPS Take 1 tablet by mouth daily. 04/15/20   [provider]  clopidogrel (PLAVIX) 75 MG tablet Take 1 tablet (75 mg) by mouth once daily 03/24/22   Leonie Man, MD  Cyanocobalamin (VITAMIN B-12) 5000 MCG LOZG Take 1 lozenge by mouth daily.     [provider]  DHEA 25 MG CAPS Take 25 mg by mouth daily.    [provider]  diazepam (VALIUM) 5 MG tablet TAKE 1 TABLET IN THE MORNING AND 2  TABLETS AT BEDTIME 08/14/22   [name], [name], NP  dicyclomine (BENTYL) 10 MG capsule Take 1 capsule (10 mg total) by mouth 4 (four) times daily -  before meals and at bedtime. 06/12/22   [name], NP  Digestive Aids Mixture (DIGESTION GB PO) Take 1 capsule by mouth daily.    [provider]  escitalopram (LEXAPRO) 20 MG tablet TAKE 1 TABLET BY MOUTH EVERY DAY 06/29/22   [name], [name], NP  Evolocumab (REPATHA SURECLICK) 762 MG/ML SOAJ Inject 140 mg into the skin every 14 (fourteen) days. 07/10/22   Hilty, Nadean Corwin, MD  ezetimibe (ZETIA) 10 MG tablet Take 1 tablet (10 mg total) by mouth daily. 07/13/22   [name], NP  ferrous sulfate 325 (65 FE) MG tablet Take 325 mg by mouth. Once daily on Mondays, Wednesdays and Fridays    [provider]  HYDROcodone-acetaminophen (NORCO) 10-325 MG tablet Take 1 tablet by mouth every 8 (eight) hours as needed for severe pain. Must last 30 days. 08/22/22 09/21/22  [name], MD  HYDROcodone-acetaminophen (NORCO) 10-325 MG tablet Take 1 tablet by mouth every 8 (eight) hours as needed for severe pain. Must last 30 days. 09/21/22 10/21/22  [name], MD  HYDROcodone-acetaminophen (NORCO) 10-325 MG tablet Take 1 tablet by mouth every 8 (eight) hours as needed for severe pain. Must last 30 days. 10/21/22 11/20/22  [name], MD  ipratropium (ATROVENT) 0.06 % nasal spray Place 1 spray into both nostrils 4 (four) times daily as needed for rhinitis. For up to 5-7 days then stop. 04/07/22   Karamalegos, Devonne Doughty, DO  Loperamide HCl (IMODIUM PO) Take by mouth as needed.     [provider]  MAGNESIUM BISGLYCINATE PO Take by mouth daily.    [provider]  melatonin  5 MG TABS Take 5 mg by mouth at bedtime.    [provider]  NONFORMULARY OR COMPOUNDED ITEM Bi-Mix Papaverine '30mg'$ , Phentolamine '1mg'$    Dosage: Inject 1cc per injection    Vial 80m   Qty #5 Refills 6 05/20/22   SBilley Co MD  nortriptyline  (PAMELOR) 25 MG capsule Take 1 capsule (25 mg total) by mouth at bedtime. 08/17/22   BJearld Fenton NP  Nutritional Supplements (NUTRITIONAL SUPPLEMENT PO) Take 1 capsule by mouth daily. [name]    [provider]  ondansetron (ZOFRAN) 4 MG tablet Take by mouth. 12/28/15   [provider]  polyethylene glycol (MIRALAX / GLYCOLAX) 17 g packet Take 17 g by mouth daily as needed.    [provider]  Probiotic Product (PROBIOTIC DAILY PO) Take by mouth daily.    [provider]  prochlorperazine (COMPAZINE) 10 MG tablet Take 1 tablet (10 mg total) by mouth 2 (two) times daily as needed for nausea or vomiting. 07/07/22   BJearld Fenton NP  propranolol ER (INDERAL LA) 60 MG 24 hr capsule Take 1 capsule (60 mg total) by mouth in the morning and at bedtime. 07/07/22   BJearld Fenton NP  pyridostigmine (MESTINON) 60 MG tablet Take 0.5 tablets (30 mg total) by mouth 2 (two) times daily. 05/22/22   BJearld Fenton NP  RABEprazole (ACIPHEX) 20 MG tablet TAKE 1 TABLET BY MOUTH TWICE A DAY 06/29/22   BJearld Fenton NP  sucralfate (CARAFATE) 1 g tablet Take 1 g by mouth 4 (four) times daily as needed.     [provider]  testosterone cypionate (DEPOTESTOSTERONE CYPIONATE) 200 MG/ML injection Inject 80 mg into the muscle every 14 (fourteen) days. 11/27/20   [provider]  valACYclovir (VALTREX) 1000 MG tablet Take 2 tabs p.o. and repeat in 12 hours as needed for cold sore 10/29/21   BJearld Fenton NP  Vibegron (GEMTESA) 75 MG TABS Take 75 mg by mouth daily. 05/20/22   SBilley Co MD    Physical Exam: Vitals:   08/18/22 1141 08/18/22 1153 08/18/22 1607 08/18/22 1710  BP:   123/84 111/71  Pulse:   70 71  Resp:   18 17  Temp:   97.9 F (36.6 C) 98.3 F (36.8 C)  TempSrc:   Oral Oral  SpO2:  92% 95% 98%  Weight: 63.5 kg     Height: '5\' 9"'$  (1.753 m)      General: Not in acute distress.  Thin body habitus HEENT:       Eyes:  PERRL, EOMI, no scleral icterus.       ENT: No discharge from the ears and nose, no pharynx injection, no tonsillar enlargement.        Neck: No JVD, no bruit, no mass felt. Heme: No neck lymph node enlargement. Cardiac: S1/S2, RRR, No murmurs, No gallops or rubs. Respiratory: No rales, wheezing, rhonchi or rubs. GI: Soft, nondistended, nontender, no rebound pain, no organomegaly, BS present. GU: No hematuria Ext: No pitting leg edema bilaterally. 1+DP/PT pulse bilaterally. Musculoskeletal: No joint deformities, No joint redness or warmth, no limitation of ROM in spin. Skin: No rashes.  Neuro: Alert, oriented X3, cranial nerves II-XII grossly intact, moves all extremities normally.  Psych: Patient is not psychotic, no suicidal or hemocidal ideation.  Labs on Admission: I have personally reviewed following labs and imaging studies  CBC: Recent Labs  Lab 08/18/22 1156  WBC 11.2*  HGB 15.1  HCT 46.3  MCV 86.2  PLT 782   Basic Metabolic Panel: Recent Labs  Lab 08/18/22 1156  NA 136  K 3.1*  CL 101  CO2 28  GLUCOSE 115*  BUN 17  CREATININE 0.75  CALCIUM 8.8*  MG 2.0   GFR: Estimated Creatinine Clearance: 72.8 mL/min (by C-G formula based on SCr of 0.75 mg/dL). Liver Function Tests: Recent Labs  Lab 08/18/22 1156  AST 21  ALT 19  ALKPHOS 92  BILITOT 0.8  PROT 6.6  ALBUMIN 2.9*   No results for input(s): "LIPASE", "AMYLASE" in the last 168 hours. No results for input(s): "AMMONIA" in the last 168 hours. Coagulation Profile: No results for input(s): "INR", "PROTIME" in the last 168 hours. Cardiac Enzymes: No results for input(s): "CKTOTAL", "CKMB", "CKMBINDEX", "TROPONINI" in the last 168 hours. BNP (last 3 results) No results for input(s): "PROBNP" in the last 8760 hours. HbA1C: No results for input(s): "HGBA1C" in the last 72 hours. CBG: No results for input(s): "GLUCAP" in the last 168 hours. Lipid Profile: No results for input(s): "CHOL", "HDL",  "LDLCALC", "TRIG", "CHOLHDL", "LDLDIRECT" in the last 72 hours. Thyroid Function Tests: No results for input(s): "TSH", "T4TOTAL", "FREET4", "T3FREE", "THYROIDAB" in the last 72 hours. Anemia Panel: No results for input(s): "VITAMINB12", "FOLATE", "FERRITIN", "TIBC", "IRON", "RETICCTPCT" in the last 72 hours. Urine analysis:    Component Value Date/Time   COLORURINE YELLOW (A) 08/18/2022 1620   APPEARANCEUR CLEAR (A) 08/18/2022 1620   APPEARANCEUR Clear 10/14/2020 1417   LABSPEC >1.046 (H) 08/18/2022 1620   PHURINE 6.0 08/18/2022 1620   GLUCOSEU NEGATIVE 08/18/2022 1620   HGBUR NEGATIVE 08/18/2022 1620   BILIRUBINUR NEGATIVE 08/18/2022 1620   BILIRUBINUR negative 03/13/2022 0952   BILIRUBINUR Negative 10/14/2020 1417   KETONESUR NEGATIVE 08/18/2022 1620   PROTEINUR NEGATIVE 08/18/2022 1620   UROBILINOGEN 0.2 03/13/2022 0952   NITRITE NEGATIVE 08/18/2022 1620   LEUKOCYTESUR NEGATIVE 08/18/2022 1620   Sepsis Labs: '@LABRCNTIP'$ (procalcitonin:4,lacticidven:4) ) Recent Results (from the past 240 hour(s))  Resp panel by RT-PCR (RSV, Flu A&B, Covid) Anterior Nasal Swab     Status: None   Collection Time: 08/13/22  3:49 PM   Specimen: Anterior Nasal Swab  Result Value Ref Range Status   SARS Coronavirus 2 by RT PCR NEGATIVE NEGATIVE Final    Comment: (NOTE) SARS-CoV-2 target nucleic acids are NOT DETECTED.  The SARS-CoV-2 RNA is generally detectable in upper respiratory specimens during the acute phase of infection. The lowest concentration of SARS-CoV-2 viral copies this assay can detect is 138 copies/mL. A negative result does not preclude SARS-Cov-2 infection and should not be used as the sole basis for treatment or other patient management decisions. A negative result may occur with  improper specimen collection/handling, submission of specimen other than nasopharyngeal swab, presence of viral mutation(s) within the areas targeted by this assay, and inadequate number of  viral copies(<138 copies/mL). A negative result must be combined with clinical observations, patient history, and epidemiological information. The expected result is Negative.  Fact Sheet for Patients:  EntrepreneurPulse.com.au  Fact Sheet for Healthcare Providers:  IncredibleEmployment.be  This test is no t yet approved or cleared by the [place] FDA and  has been authorized for detection and/or diagnosis of SARS-CoV-2 by FDA under an Emergency Use Authorization (EUA). This EUA will remain  in effect (meaning this test can be used) for the duration of the COVID-19 declaration under Section 564(b)(1) of the Act, 21 U.S.C.section 360bbb-3(b)(1), unless the authorization is terminated  or revoked sooner.       Influenza A by PCR NEGATIVE NEGATIVE Final   Influenza B by PCR NEGATIVE NEGATIVE Final    Comment: (NOTE) The Xpert Xpress SARS-CoV-2/FLU/RSV plus assay is intended as an aid in the diagnosis of influenza from Nasopharyngeal swab specimens and should not be used as a sole basis for treatment. Nasal washings and aspirates are unacceptable for Xpert Xpress SARS-CoV-2/FLU/RSV testing.  Fact Sheet for Patients: EntrepreneurPulse.com.au  Fact Sheet for Healthcare Providers: IncredibleEmployment.be  This test is not yet approved or cleared by the [place] FDA and has been authorized for detection and/or diagnosis of SARS-CoV-2 by FDA under an Emergency Use Authorization (EUA). This EUA will remain in effect (meaning this test can be used) for the duration of the COVID-19 declaration under Section 564(b)(1) of the Act, 21 U.S.C. section 360bbb-3(b)(1), unless the authorization is terminated or revoked.     Resp Syncytial Virus by PCR NEGATIVE NEGATIVE Final    Comment: (NOTE) Fact Sheet for Patients: EntrepreneurPulse.com.au  Fact Sheet for Healthcare  Providers: IncredibleEmployment.be  This test is not yet approved or cleared by the [place] FDA and has been authorized for detection and/or diagnosis of SARS-CoV-2 by FDA under an Emergency Use Authorization (EUA). This EUA will remain in effect (meaning this test can be used) for the duration of the COVID-19 declaration under Section 564(b)(1) of the Act, 21 U.S.C. section 360bbb-3(b)(1), unless the authorization is terminated or revoked.  Performed at [facility], [place] [address] [place], [place] [address]   Resp panel by RT-PCR (RSV, Flu A&B, Covid) Anterior Nasal Swab     Status: None   Collection Time: 08/18/22 11:50 AM   Specimen: Anterior Nasal Swab  Result Value Ref Range Status   SARS Coronavirus 2 by RT PCR NEGATIVE NEGATIVE Final    Comment: (NOTE) SARS-CoV-2 target nucleic acids are NOT DETECTED.  The SARS-CoV-2 RNA is generally detectable in upper respiratory specimens during the acute phase of infection. The lowest concentration of SARS-CoV-2 viral copies this assay can detect is 138 copies/mL. A negative result does not preclude SARS-Cov-2 infection and should not be used as the sole basis for treatment or other patient management decisions. A negative result may occur with  improper specimen collection/handling, submission of specimen other than nasopharyngeal swab, presence of viral mutation(s) within the areas targeted by this assay, and inadequate number of viral copies(<138 copies/mL). A negative result must be combined with clinical observations, patient history, and epidemiological information. The expected result is Negative.  Fact Sheet for Patients:  EntrepreneurPulse.com.au  Fact Sheet for Healthcare Providers:  IncredibleEmployment.be  This test is no t yet approved or cleared by the [place] FDA and  has been authorized for detection and/or diagnosis of SARS-CoV-2 by FDA  under an Emergency Use Authorization (EUA). This EUA will remain  in effect (meaning this test can be used) for the duration of the COVID-19 declaration under Section 564(b)(1) of the Act, 21 U.S.C.section 360bbb-3(b)(1), unless the authorization is terminated  or revoked sooner.       Influenza A by PCR NEGATIVE NEGATIVE Final   Influenza B by PCR NEGATIVE NEGATIVE Final    Comment: (NOTE) The Xpert Xpress SARS-CoV-2/FLU/RSV plus assay is intended as an aid in the diagnosis of influenza from Nasopharyngeal swab specimens and should not be used as a sole basis for treatment. Nasal washings and aspirates are unacceptable for Xpert Xpress SARS-CoV-2/FLU/RSV testing.  Fact Sheet for Patients: EntrepreneurPulse.com.au  Fact  Sheet for Healthcare Providers: IncredibleEmployment.be  This test is not yet approved or cleared by the [place] and has been authorized for detection and/or diagnosis of SARS-CoV-2 by FDA under an Emergency Use Authorization (EUA). This EUA will remain in effect (meaning this test can be used) for the duration of the COVID-19 declaration under Section 564(b)(1) of the Act, 21 U.S.C. section 360bbb-3(b)(1), unless the authorization is terminated or revoked.     Resp Syncytial Virus by PCR NEGATIVE NEGATIVE Final    Comment: (NOTE) Fact Sheet for Patients: EntrepreneurPulse.com.au  Fact Sheet for Healthcare Providers: IncredibleEmployment.be  This test is not yet approved or cleared by the [place] FDA and has been authorized for detection and/or diagnosis of SARS-CoV-2 by FDA under an Emergency Use Authorization (EUA). This EUA will remain in effect (meaning this test can be used) for the duration of the COVID-19 declaration under Section 564(b)(1) of the Act, 21 U.S.C. section 360bbb-3(b)(1), unless the authorization is terminated or revoked.  Performed at [facility], [address] [place], [place] [address]      Radiological Exams on Admission: CT Chest W Contrast  Result Date: 08/18/2022 CLINICAL DATA:  Fever, shortness of breath, hypoxia, cough EXAM: CT CHEST WITH CONTRAST TECHNIQUE: Multidetector CT imaging of the chest was performed during intravenous contrast administration. RADIATION DOSE REDUCTION: This exam was performed according to the departmental dose-optimization program which includes automated exposure control, adjustment of the mA and/or kV according to patient size and/or use of iterative reconstruction technique. CONTRAST:  74m OMNIPAQUE IOHEXOL 300 MG/ML  SOLN COMPARISON:  Previous studies including the CT done on 0August 15, 2023FINDINGS: Cardiovascular: There is homogeneous enhancement in thoracic aorta. 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 There are no intraluminal filling defects in central pulmonary artery branches. Coronary artery calcifications are seen. Mediastinum/Nodes: No new significant lymphadenopathy is seen. Lungs/Pleura: Patchy infiltrates are seen in right upper lobe, right middle lobe, right lower lobe and left upper lobe. Small patchy infiltrates are seen in left lower lobe. There is interval decrease in size of pleural-based infiltrate in the posterior right lower lobe. There is interval increase in infiltrates in the rest of the lung fields. There are scattered nodular densities in both lungs with interval increase in number. There is minimal right pleural effusion. There is no pneumothorax. Upper Abdomen: There is decreased density in liver in comparison to the spleen. Small hiatal hernia is seen. Musculoskeletal: There is neurostimulator lead in thoracic spinal canal. No acute findings are seen in bony structures. IMPRESSION: Patchy infiltrates are seen in both lungs, more so in the right lung. There are multiple noncalcified nodules of varying sizes in both lungs with interval increase in  number and size. There is interval decrease in size of mass like infiltrate in the posterior right lower lobe. Findings may suggest worsening multifocal pneumonia. Possibility of underlying neoplastic process is not excluded. Short-term follow-up CT in 3 months should be considered. There is minimal right pleural effusion. Coronary artery calcifications are seen. 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 Recommend annual imaging followup by CTA or MRA. This recommendation follows 2010 ACCF/AHA/AATS/ACR/ASA/SCA/SCAI/SIR/STS/SVM Guidelines for the Diagnosis and Management of Patients with Thoracic Aortic Disease. Circulation. 2010; 121:: [id number] Aortic aneurysm NOS (ICD10-I71.9) Small hiatal hernia. Other findings as described in the body of the report. Electronically Signed   By: PElmer PickerM.D.   On: 08/18/2022 14:29   DG Chest 2 View  Result Date: 08/18/2022  CLINICAL DATA:  Shortness of breath EXAM: CHEST - 2 VIEW COMPARISON:  04/15/2022 FINDINGS: The heart size and mediastinal contours are within normal limits. Extensive, heterogeneous and somewhat nodular airspace opacity of the right midlung and bilateral lung bases, which is increased compared to prior examination dated 04/15/2022. The visualized skeletal structures are unremarkable. IMPRESSION: Extensive, heterogeneous and somewhat nodular airspace opacity of the right midlung and bilateral lung bases, which is increased compared to prior examination dated 04/15/2022. Findings are most consistent with worsened infection or aspiration. Electronically Signed   By: Delanna Ahmadi M.D.   On: 08/18/2022 12:27      Assessment/Plan Principal Problem:   Atypical pneumonia Active Problems:   Essential hypertension   Hyperlipidemia LDL goal <70   CAD (coronary artery disease)   Hypokalemia   Anxiety and depression   Protein-calorie malnutrition, moderate (HCC)   Assessment and Plan:  Atypical pneumonia: Etiology is  not clear.  Patient has mild leukocytosis to 11.2, no fever.  Does not meets criteria for sepsis.  I consulted Dr. Dr. Delaine Lame of infectious disease and Dr. Lanney Gins of pulmonology. Per Dr. Delaine Lame, will hold further Abx now. Pending further testing and pulmonology consultation.  - Will admit to med-surg bed as inpt - Patient received 1 dose of IV Rocephin and azithromycin in ED, will hold off antibiotics now - Mucinex for cough  - Bronchodilators - Urine legionella and S. pneumococcal antigen - Follow up blood culture x2, sputum culture  Essential hypertension -IV hydralazine as needed - [place]  Hyperlipidemia LDL goal <70 -Zetia -Patient is also getting Repatha  CAD (coronary artery disease) -ASA, zetia, plavix  Hypokalemia: Potassium 3.1 -Repleted potassium -Check magnesium level  Anxiety and depression -Continue home medications  Protein-calorie malnutrition, moderate ([place]): Body weight 63.5 kg, BMI 20.67, lost several pounds recently -Start Ensure      DVT ppx: SQ Lovenox  Code Status: Full code  Family Communication:  Yes, patient's wife  at bed side.     Disposition Plan:  Anticipate discharge back to previous environment  Consults called: Dr. Delaine Lame of infectious disease and Dr. Lanney Gins of pulmonology are consulted.   Admission status and Level of care: Med-Surg:    as inpt      Dispo: The patient is from: Home              Anticipated d/c is to: Home              Anticipated d/c date is: 2 days              Patient currently is not medically stable to d/c.    Severity of Illness:  The appropriate patient status for this patient is INPATIENT. Inpatient status is judged to be reasonable and necessary in order to provide the required intensity of service to ensure the patient's safety. The patient's presenting symptoms, physical exam findings, and initial radiographic and laboratory data in the context of their chronic comorbidities is  felt to place them at high risk for further clinical deterioration. Furthermore, it is not anticipated that the patient will be medically stable for discharge from the hospital within 2 midnights of admission.   * I certify that at the point of admission it is my clinical judgment that the patient will require inpatient hospital care spanning beyond 2 midnights from the point of admission due to high intensity of service, high risk for further deterioration and high frequency of surveillance required.*       Date  of Service 08/18/2022    [facility]   If 7PM-7AM, please contact night-coverage [email or web address] 08/18/2022, 5:40 PM

## 2022-08-18 NOTE — ED Notes (Signed)
ED TO INPATIENT HANDOFF REPORT  ED Nurse Name and Phone #: Salvatore Decent Name/Age/Gender Grant Young 76 y.o. male Room/Bed: ED42A/ED42A  Code Status   Code Status: Full Code  Home/SNF/Other Home Patient oriented to: self, place, and time Is this baseline? Yes   Triage Complete: Triage complete  Chief Complaint CAP (community acquired pneumonia) [J18.9]  Triage Note Pt here with a cough and congestion. Pt states he has not been feeling well lately and was sent here from UC. Pt stable and ambulatory in triage.   Allergies Allergies  Allergen Reactions   Ace Inhibitors     Other reaction(s): Cough   Fluoxetine Anxiety    "made me fall asleep" per pt "bad headaches and "makes  Me  Crazy" historical allergy noted in [employer] "made me fall asleep" per pt "bad headaches and "makes  Me  Crazy" Per New Patient Packet.     Metoclopramide     Other reaction(s): Other (See Comments), Other (See Comments), Unknown Tardive Dyskinesia  historical allergy noted in [employer] Tardive Dyskinesia  Per New Patient Packet.   Nalbuphine     Used Post Back surgery- Anesthesiologist Error. Patient had Narcotic Withdraw. Per New Patient Packet.    Other     Other reaction(s): Other (See Comments) Altered mental status in combo with narcotics at previous hospitalization - Full Withdrawal Symptoms Other reaction(s): Rash   Amoxicillin-Pot Clavulanate Nausea Only    Per New Patient Packet.   Doxazosin Rash    Other reaction(s): Other - See Comments, Rash UNKNOWN REACTION UNKNOWN REACTION    Duloxetine Nausea Only    Per New Patient Packet.    Penicillins Nausea Only    Per New Patient Packet.   Tamsulosin Itching and Anxiety    Restless, Flushing, Heavy Chest, Itching, Hyperactive mood and Anxiety. Unable to handle side effects. Per New Patient Packet.     Trazodone And Nefazodone Itching, Anxiety and Rash    Headache. "INCREASED MY ANXIETY AND HEARTRATE" Flushing,  tachycardia "INCREASED MY ANXIETY AND HEARTRATE" Per New Patient Packet.    Amlodipine     Shaking, unsure of reaction. Per New Patient Packet.     Cinoxacin     GI Intolerance, and Dizziness. Per New Patient Packet.    Ciprofloxacin     Other reaction(s): Unknown   Fludrocortisone Other (See Comments)    "Worsening headaches, GI issues, fatigue"   Nebivolol     Other reaction(s): Unknown   Olanzapine     Headache and unable to sleep for 3 nights. Per New Patient Packet.    Olmesartan     Other reaction(s): Unknown   Pregabalin     Confusion, Lack of concentration, dizziness, and likely drowsiness. Per New Patient Packet.     Prostaglandins     Other reaction(s): Other (See Comments) Intolerance   Thyroid Hormones     Other reaction(s): Other (See Comments) Thyroid (Nature Thyroid) contraindicated with some of your other medications.   Zolpidem     Nightmares, Ineffective after 2 days. Per New Patient Packet.    Duloxetine Hcl     Other reaction(s): Rash   Fluoxetine Hcl     Other reaction(s): Rash   Nucynta [Tapentadol] Other (See Comments)    Vertigo    Phenytoin Anxiety    Hyperactivity, and Ineffective. Per New Patient Packet.     Level of Care/Admitting Diagnosis ED Disposition     ED Disposition  Admit   Condition  --   Comment  Hospital Area: [place] [[id number]]  Level of Care: Med-Surg [16]  Covid Evaluation: Asymptomatic - no recent exposure (last 10 days) testing not required  Diagnosis: CAP (community acquired pneumonia) [818299]  Admitting Physician: Ivor Costa [[id number]]  Attending Physician: Ivor Costa [[id number]]  Certification:: I certify this patient will need inpatient services for at least 2 midnights  Estimated Length of Stay: 2          B Medical/Surgery History Past Medical History:  Diagnosis Date   Anxiety    Per New Patient Packet   Chronic back pain    Per New Patient Packet   Chronic heart disease    Per  New Patient Packet   Depression    GERD (gastroesophageal reflux disease)    Headache    History of gastritis    Per New Patient Packet   History of neuropathy    Per New Patient Packet   Hypertension    Per New Patient Packet   Insomnia    Kidney stone    Lumbar radicular pain    Per New Patient Packet   Lyme disease    Per New Patient Packet   Myocardial infarction [facility])    Overactive bladder    PONV (postoperative nausea and vomiting)    Skin cancer, basal cell    Spinal cord stimulator status    01/08/21 - not currently using.   Squamous cell skin cancer    Substance abuse [facility])    Past Surgical History:  Procedure Laterality Date   BACK SURGERY     CARDIAC CATHETERIZATION     CATARACT EXTRACTION W/PHACO Left 01/14/2021   Procedure: CATARACT EXTRACTION PHACO AND INTRAOCULAR LENS PLACEMENT ([facility]) LEFT VIVITY TORIC LENS 8.75 00:56.7;  Surgeon: Birder Robson, MD;  Location: [place];  Service: Ophthalmology;  Laterality: Left;   CATARACT EXTRACTION W/PHACO Right 01/28/2021   Procedure: CATARACT EXTRACTION PHACO AND INTRAOCULAR LENS PLACEMENT ([place]) RIGHT VIVITY TORIC LENS;  Surgeon: Birder Robson, MD;  Location: [place];  Service: Ophthalmology;  Laterality: Right;  6.54 00:46.4   CHOLECYSTECTOMY  2017   COLONOSCOPY  09/15/2015   Per New Patient Packet   COLONOSCOPY WITH PROPOFOL N/A 10/03/2020   Procedure: COLONOSCOPY WITH PROPOFOL;  Surgeon: [name], MD;  Location: [facility] ENDOSCOPY;  Service: Gastroenterology;  Laterality: N/A;   CORONARY/GRAFT ACUTE MI REVASCULARIZATION N/A 11/27/2021   Procedure: Coronary/Graft Acute MI Revascularization;  Surgeon: Leonie Man, MD;  Location: [place] CV LAB;  Service: Cardiovascular;  Laterality: N/A;   ESOPHAGOGASTRODUODENOSCOPY (EGD) WITH PROPOFOL N/A 10/03/2020   Procedure: ESOPHAGOGASTRODUODENOSCOPY (EGD) WITH PROPOFOL;  Surgeon: [name], MD;  Location: [facility] ENDOSCOPY;  Service:  Gastroenterology;  Laterality: N/A;   FOOT SURGERY Bilateral 03/18/2020   FOOT SURGERY  08/032021   GALLBLADDER SURGERY  09/15/2015   Gallbladder Removal. Procedure done by Dr.Beverly. Per New Patient Packet   KIDNEY STONE SURGERY  09/14/1980   Too many to count. Per New Patient Packet 09/14/1980-09/15/1995   KIDNEY STONE SURGERY  09/15/1995   Too many to count. Per New Patient Packet   LEFT HEART CATH AND CORONARY ANGIOGRAPHY N/A 11/27/2021   Procedure: LEFT HEART CATH AND CORONARY ANGIOGRAPHY;  Surgeon: Leonie Man, MD;  Location: [place] CV LAB;  Service: Cardiovascular;  Laterality: N/A;   LITHOTRIPSY  09/14/2014   Per New Patient Packet   LITHOTRIPSY  09/14/1996   No Stints Used. Per New Patient Packet   LITHOTRIPSY  09/14/1997   No Stints  used. Per New Patient Packet   PAIN PUMP IMPLANTATION     PAIN PUMP REMOVAL     SHOULDER SURGERY Right 1984   SIGMOIDOSCOPY  09/14/2017   Per New Patient Packet   SPINAL CORD STIMULATOR IMPLANT     TONSILLECTOMY     UPPER GI ENDOSCOPY       A IV Location/Drains/Wounds Patient Lines/Drains/Airways Status     Active Line/Drains/Airways     Name Placement date Placement time Site Days   Peripheral IV 08/18/22 20 G Anterior;Left Forearm 08/18/22  1202  Forearm  less than 1            Intake/Output Last 24 hours  Intake/Output Summary (Last 24 hours) at 08/18/2022 1623 Last data filed at 08/18/2022 1545 Gross per 24 hour  Intake 352.62 ml  Output --  Net 352.62 ml    Labs/Imaging Results for orders placed or performed during the hospital encounter of 08/18/22 (from the past 48 hour(s))  Resp panel by RT-PCR (RSV, Flu A&B, Covid) Anterior Nasal Swab     Status: None   Collection Time: 08/18/22 11:50 AM   Specimen: Anterior Nasal Swab  Result Value Ref Range   SARS Coronavirus 2 by RT PCR NEGATIVE NEGATIVE    Comment: (NOTE) SARS-CoV-2 target nucleic acids are NOT DETECTED.  The SARS-CoV-2 RNA is generally  detectable in upper respiratory specimens during the acute phase of infection. The lowest concentration of SARS-CoV-2 viral copies this assay can detect is 138 copies/mL. A negative result does not preclude SARS-Cov-2 infection and should not be used as the sole basis for treatment or other patient management decisions. A negative result may occur with  improper specimen collection/handling, submission of specimen other than nasopharyngeal swab, presence of viral mutation(s) within the areas targeted by this assay, and inadequate number of viral copies(<138 copies/mL). A negative result must be combined with clinical observations, patient history, and epidemiological information. The expected result is Negative.  Fact Sheet for Patients:  EntrepreneurPulse.com.au  Fact Sheet for Healthcare Providers:  IncredibleEmployment.be  This test is no t yet approved or cleared by the [place] FDA and  has been authorized for detection and/or diagnosis of SARS-CoV-2 by FDA under an Emergency Use Authorization (EUA). This EUA will remain  in effect (meaning this test can be used) for the duration of the COVID-19 declaration under Section 564(b)(1) of the Act, 21 U.S.C.section 360bbb-3(b)(1), unless the authorization is terminated  or revoked sooner.       Influenza A by PCR NEGATIVE NEGATIVE   Influenza B by PCR NEGATIVE NEGATIVE    Comment: (NOTE) The Xpert Xpress SARS-CoV-2/FLU/RSV plus assay is intended as an aid in the diagnosis of influenza from Nasopharyngeal swab specimens and should not be used as a sole basis for treatment. Nasal washings and aspirates are unacceptable for Xpert Xpress SARS-CoV-2/FLU/RSV testing.  Fact Sheet for Patients: EntrepreneurPulse.com.au  Fact Sheet for Healthcare Providers: IncredibleEmployment.be  This test is not yet approved or cleared by the [place] FDA and has  been authorized for detection and/or diagnosis of SARS-CoV-2 by FDA under an Emergency Use Authorization (EUA). This EUA will remain in effect (meaning this test can be used) for the duration of the COVID-19 declaration under Section 564(b)(1) of the Act, 21 U.S.C. section 360bbb-3(b)(1), unless the authorization is terminated or revoked.     Resp Syncytial Virus by PCR NEGATIVE NEGATIVE    Comment: (NOTE) Fact Sheet for Patients: EntrepreneurPulse.com.au  Fact Sheet for Healthcare Providers: IncredibleEmployment.be  This test is not yet approved or cleared by the [place] and has been authorized for detection and/or diagnosis of SARS-CoV-2 by FDA under an Emergency Use Authorization (EUA). This EUA will remain in effect (meaning this test can be used) for the duration of the COVID-19 declaration under Section 564(b)(1) of the Act, 21 U.S.C. section 360bbb-3(b)(1), unless the authorization is terminated or revoked.  Performed at [facility], [address] [place], [place] [address]   Lactic acid, plasma     Status: None   Collection Time: 08/18/22 11:55 AM  Result Value Ref Range   Lactic Acid, Venous 1.2 0.5 - 1.9 mmol/L    Comment: Performed at [facility], [address] [place], [place] [address]  CBC     Status: Abnormal   Collection Time: 08/18/22 11:56 AM  Result Value Ref Range   WBC 11.2 (H) 4.0 - 10.5 K/uL   RBC 5.37 4.22 - 5.81 MIL/uL   Hemoglobin 15.1 13.0 - 17.0 g/dL   HCT 46.3 39.0 - 52.0 %   MCV 86.2 80.0 - 100.0 fL   MCH 28.1 26.0 - 34.0 pg   MCHC 32.6 30.0 - 36.0 g/dL   RDW 12.6 11.5 - 15.5 %   Platelets 355 150 - 400 K/uL   nRBC 0.0 0.0 - 0.2 %    Comment: Performed at [facility], [address], [place], [place] [address]  Comprehensive metabolic panel     Status: Abnormal   Collection Time: 08/18/22 11:56 AM  Result Value Ref Range   Sodium 136 135 - 145 mmol/L    Potassium 3.1 (L) 3.5 - 5.1 mmol/L   Chloride 101 98 - 111 mmol/L   CO2 28 22 - 32 mmol/L   Glucose, Bld 115 (H) 70 - 99 mg/dL    Comment: Glucose reference range applies only to samples taken after fasting for at least 8 hours.   BUN 17 8 - 23 mg/dL   Creatinine, Ser 0.75 0.61 - 1.24 mg/dL   Calcium 8.8 (L) 8.9 - 10.3 mg/dL   Total Protein 6.6 6.5 - 8.1 g/dL   Albumin 2.9 (L) 3.5 - 5.0 g/dL   AST 21 15 - 41 U/L   ALT 19 0 - 44 U/L   Alkaline Phosphatase 92 38 - 126 U/L   Total Bilirubin 0.8 0.3 - 1.2 mg/dL   GFR, Estimated >60 >60 mL/min    Comment: (NOTE) Calculated using the CKD-EPI Creatinine Equation (2021)    Anion gap 7 5 - 15    Comment: Performed at [facility], [address], [place], [place] [address]  Troponin I (High Sensitivity)     Status: None   Collection Time: 08/18/22 11:56 AM  Result Value Ref Range   Troponin I (High Sensitivity) 11 <18 ng/L    Comment: (NOTE) Elevated high sensitivity troponin I (hsTnI) values and significant  changes across serial measurements may suggest ACS but many other  chronic and acute conditions are known to elevate hsTnI results.  Refer to the "Links" section for chest pain algorithms and additional  guidance. Performed at [facility], [address] [place], [place] [address]   Magnesium     Status: None   Collection Time: 08/18/22 11:56 AM  Result Value Ref Range   Magnesium 2.0 1.7 - 2.4 mg/dL    Comment: Performed at [facility], [address], [place], [place] [address]  Procalcitonin     Status: None  Collection Time: 08/18/22 11:56 AM  Result Value Ref Range   Procalcitonin 0.17 ng/mL    Comment:        Interpretation: PCT (Procalcitonin) <= 0.5 ng/mL: Systemic infection (sepsis) is not likely. Local bacterial infection is possible. (NOTE)       Sepsis PCT Algorithm           Lower Respiratory Tract                                      Infection PCT Algorithm     ----------------------------     ----------------------------         PCT < 0.25 ng/mL                PCT < 0.10 ng/mL          Strongly encourage             Strongly discourage   discontinuation of antibiotics    initiation of antibiotics    ----------------------------     -----------------------------       PCT 0.25 - 0.50 ng/mL            PCT 0.10 - 0.25 ng/mL               OR       >80% decrease in PCT            Discourage initiation of                                            antibiotics      Encourage discontinuation           of antibiotics    ----------------------------     -----------------------------         PCT >= 0.50 ng/mL              PCT 0.26 - 0.50 ng/mL               AND        <80% decrease in PCT             Encourage initiation of                                             antibiotics       Encourage continuation           of antibiotics    ----------------------------     -----------------------------        PCT >= 0.50 ng/mL                  PCT > 0.50 ng/mL               AND         increase in PCT                  Strongly encourage                                      initiation of antibiotics    Strongly encourage escalation  of antibiotics                                     -----------------------------                                           PCT <= 0.25 ng/mL                                                 OR                                        > 80% decrease in PCT                                      Discontinue / Do not initiate                                             antibiotics  Performed at [facility], [address] [place], [place] [address]    CT Chest W Contrast  Result Date: 08/18/2022 CLINICAL DATA:  Fever, shortness of breath, hypoxia, cough EXAM: CT CHEST WITH CONTRAST TECHNIQUE: Multidetector CT imaging of the chest was performed during intravenous contrast administration. RADIATION DOSE  REDUCTION: This exam was performed according to the departmental dose-optimization program which includes automated exposure control, adjustment of the mA and/or kV according to patient size and/or use of iterative reconstruction technique. CONTRAST:  73m OMNIPAQUE IOHEXOL 300 MG/ML  SOLN COMPARISON:  Previous studies including the CT done on 0August 30, 2023FINDINGS: Cardiovascular: There is homogeneous enhancement in thoracic aorta. 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 There are no intraluminal filling defects in central pulmonary artery branches. Coronary artery calcifications are seen. Mediastinum/Nodes: No new significant lymphadenopathy is seen. Lungs/Pleura: Patchy infiltrates are seen in right upper lobe, right middle lobe, right lower lobe and left upper lobe. Small patchy infiltrates are seen in left lower lobe. There is interval decrease in size of pleural-based infiltrate in the posterior right lower lobe. There is interval increase in infiltrates in the rest of the lung fields. There are scattered nodular densities in both lungs with interval increase in number. There is minimal right pleural effusion. There is no pneumothorax. Upper Abdomen: There is decreased density in liver in comparison to the spleen. Small hiatal hernia is seen. Musculoskeletal: There is neurostimulator lead in thoracic spinal canal. No acute findings are seen in bony structures. IMPRESSION: Patchy infiltrates are seen in both lungs, more so in the right lung. There are multiple noncalcified nodules of varying sizes in both lungs with interval increase in number and size. There is interval decrease in size of mass like infiltrate in the posterior right lower lobe. Findings may suggest worsening multifocal pneumonia. Possibility of underlying neoplastic process is not excluded. Short-term follow-up CT in 3 months should be considered. There is minimal  right pleural effusion. Coronary artery calcifications are  seen. 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 Recommend annual imaging followup by CTA or MRA. This recommendation follows 2010 ACCF/AHA/AATS/ACR/ASA/SCA/SCAI/SIR/STS/SVM Guidelines for the Diagnosis and Management of Patients with Thoracic Aortic Disease. Circulation. 2010; 121: E527-P824. Aortic aneurysm NOS (ICD10-I71.9) Small hiatal hernia. Other findings as described in the body of the report. Electronically Signed   By: Elmer Picker M.D.   On: 08/18/2022 14:29   DG Chest 2 View  Result Date: 08/18/2022 CLINICAL DATA:  Shortness of breath EXAM: CHEST - 2 VIEW COMPARISON:  04/15/2022 FINDINGS: The heart size and mediastinal contours are within normal limits. Extensive, heterogeneous and somewhat nodular airspace opacity of the right midlung and bilateral lung bases, which is increased compared to prior examination dated 04/15/2022. The visualized skeletal structures are unremarkable. IMPRESSION: Extensive, heterogeneous and somewhat nodular airspace opacity of the right midlung and bilateral lung bases, which is increased compared to prior examination dated 04/15/2022. Findings are most consistent with worsened infection or aspiration. Electronically Signed   By: Delanna Ahmadi M.D.   On: 08/18/2022 12:27    Pending Labs Unresulted Labs (From admission, onward)     Start     Ordered   08/19/22 0500  CBC  Tomorrow morning,   R        08/18/22 1343   08/19/22 2353  Basic metabolic panel  Tomorrow morning,   R        08/18/22 1343   08/18/22 1520  Expectorated Sputum Assessment w Gram Stain, Rflx to Resp Cult  Once,   R        08/18/22 1519   08/18/22 1519  Fungus culture, blood  Once,   R        08/18/22 1518   08/18/22 1519  Cryptococcus Antigen, Serum  Once,   R        08/18/22 1518   08/18/22 1519  Culture, fungus without smear  Once,   R        08/18/22 1519   08/18/22 1518  Fungitell, Serum (as Misc Send Out)  Once,   R       Comments: Fungitell, Serum has  been discontinued by [facility].  The replacement test is Fungitell, Beta-D-Glucan, [facility] Test # [id number]. TESTING WILL BE PERFORMED AT [facility].  RESULT WILL DISPLAY IN RESULTS REVIEW UNDER "MISCELLANEOUS TEST."   Question:  Test name / description:  AnswerLeata Mouse, [facility] Test # [id number]   08/18/22 1517   08/18/22 1455  Cryptococcal antigen  Once,   R        08/18/22 1454   08/18/22 1454  Angiotensin converting enzyme  Once,   R        08/18/22 1453   08/18/22 1453  Miscellaneous [facility] test (send-out)  Once,   R       Question:  Test name / description:  Answer:  [id number] fungal antibodies   08/18/22 1452   08/18/22 1453  Histoplasma antigen, urine  Once,   R        08/18/22 1452   08/18/22 1453  ANA Comprehensive Panel  Once,   R        08/18/22 1452   08/18/22 1453  ANCA Profile  Once,   R        08/18/22 1452   08/18/22 1452  Fungitell, Serum (as Misc Send Out)  Once,   R       Comments: Fungitell, Serum  has been discontinued by [facility].  The replacement test is Fungitell, Beta-D-Glucan, [facility] Test # [id number]. TESTING WILL BE PERFORMED AT [facility].  RESULT WILL DISPLAY IN RESULTS REVIEW UNDER "MISCELLANEOUS TEST."   Question:  Test name / description:  AnswerLeata Mouse, [facility] Test # [id number]   08/18/22 5784   08/18/22 1451  Respiratory (~20 pathogens) panel by PCR  (Respiratory panel by PCR (~20 pathogens, ~24 hr TAT)  w precautions)  Once,   R        08/18/22 1450   08/18/22 1451  HIV Antibody (routine testing w rflx)  (HIV Antibody (Routine testing w reflex) panel)  Once,   R        08/18/22 1451   08/18/22 1343  Expectorated Sputum Assessment w Gram Stain, Rflx to Resp Cult  (COPD / Pneumonia / Cellulitis / Lower Extremity Wound)  Once,   R        08/18/22 1343   08/18/22 1343  Legionella Pneumophila Serogp 1 Ur Ag  (COPD / Pneumonia / Cellulitis / Lower Extremity Wound)  Once,   R        08/18/22 1343   08/18/22 1343  Strep pneumoniae urinary  antigen  (COPD / Pneumonia / Cellulitis / Lower Extremity Wound)  Once,   R        08/18/22 1343   08/18/22 1150  Urinalysis, Routine w reflex microscopic  Once,   URGENT        08/18/22 1149   08/18/22 1149  Culture, blood (routine x 2)  BLOOD CULTURE X 2,   STAT      08/18/22 1149   08/18/22 1149  Lactic acid, plasma  Now then every 2 hours,   STAT      08/18/22 1149            Vitals/Pain Today's Vitals   08/18/22 1134 08/18/22 1141 08/18/22 1153 08/18/22 1607  BP: 132/87   123/84  Pulse: 78   70  Resp: 20   18  Temp: 98.1 F (36.7 C)   97.9 F (36.6 C)  TempSrc: Oral   Oral  SpO2: (!) 87%  92% 95%  Weight:  139 lb 15.9 oz (63.5 kg)    Height:  '5\' 9"'$  (1.753 m)    PainSc:    3     Isolation Precautions Droplet precaution  Medications Medications  dextromethorphan-guaiFENesin (MUCINEX DM) 30-600 MG per 12 hr tablet 1 tablet (has no administration in time range)  ondansetron (ZOFRAN) injection 4 mg (has no administration in time range)  acetaminophen (TYLENOL) tablet 650 mg (has no administration in time range)  hydrALAZINE (APRESOLINE) injection 5 mg (has no administration in time range)  albuterol (PROVENTIL) (2.5 MG/3ML) 0.083% nebulizer solution 2.5 mg (has no administration in time range)  enoxaparin (LOVENOX) injection 40 mg (40 mg Subcutaneous Given 08/18/22 1618)  feeding supplement (ENSURE ENLIVE / ENSURE PLUS) liquid 237 mL (has no administration in time range)  azithromycin (ZITHROMAX) 500 mg in sodium chloride 0.9 % 250 mL IVPB (0 mg Intravenous Stopped 08/18/22 1545)  potassium chloride SA (KLOR-CON M) CR tablet 40 mEq (40 mEq Oral Given 08/18/22 1617)  iohexol (OMNIPAQUE) 300 MG/ML solution 75 mL (75 mLs Intravenous Contrast Given 08/18/22 1402)    Mobility walks Low fall risk      R Recommendations: See Admitting Provider Note  Report given to:   Additional Notes:

## 2022-08-19 DIAGNOSIS — J129 Viral pneumonia, unspecified: Secondary | ICD-10-CM | POA: Diagnosis not present

## 2022-08-19 DIAGNOSIS — J189 Pneumonia, unspecified organism: Secondary | ICD-10-CM | POA: Diagnosis not present

## 2022-08-19 LAB — EXPECTORATED SPUTUM ASSESSMENT W GRAM STAIN, RFLX TO RESP C

## 2022-08-19 LAB — BASIC METABOLIC PANEL
Anion gap: 7 (ref 5–15)
BUN: 12 mg/dL (ref 8–23)
CO2: 30 mmol/L (ref 22–32)
Calcium: 8.8 mg/dL — ABNORMAL LOW (ref 8.9–10.3)
Chloride: 101 mmol/L (ref 98–111)
Creatinine, Ser: 0.75 mg/dL (ref 0.61–1.24)
GFR, Estimated: >60 mL/min (ref >60)
Glucose, Bld: 92 mg/dL (ref 70–99)
Potassium: 3.3 mmol/L — ABNORMAL LOW (ref 3.5–5.1)
Sodium: 138 mmol/L (ref 135–145)

## 2022-08-19 LAB — CBC
HCT: 42.7 % (ref 39.0–52.0)
Hemoglobin: 14 g/dL (ref 13.0–17.0)
MCH: 28.3 pg (ref 26.0–34.0)
MCHC: 32.8 g/dL (ref 30.0–36.0)
MCV: 86.4 fL (ref 80.0–100.0)
Platelets: 362 10*3/uL (ref 150–400)
RBC: 4.94 MIL/uL (ref 4.22–5.81)
RDW: 12.7 % (ref 11.5–15.5)
WBC: 8.3 10*3/uL (ref 4.0–10.5)
nRBC: 0 % (ref 0.0–0.2)

## 2022-08-19 LAB — RESPIRATORY PANEL BY PCR

## 2022-08-19 LAB — HIV ANTIBODY (ROUTINE TESTING W REFLEX): HIV Screen 4th Generation wRfx: NONREACTIVE

## 2022-08-19 LAB — PROCALCITONIN: Procalcitonin: 0.11 ng/mL

## 2022-08-19 LAB — MRSA NEXT GEN BY PCR, NASAL: MRSA by PCR Next Gen: NOT DETECTED

## 2022-08-19 MED ORDER — ATOGEPANT 60 MG PO TABS
60.0000 mg | ORAL_TABLET | Freq: Every day | ORAL | Status: DC
  Administered 2022-08-19: 60 mg via ORAL
  Filled 2022-08-19: qty 1

## 2022-08-19 MED ORDER — POTASSIUM CHLORIDE CRYS ER 20 MEQ PO TBCR
40.0000 meq | EXTENDED_RELEASE_TABLET | Freq: Once | ORAL | Status: AC
  Administered 2022-08-19: 40 meq via ORAL
  Filled 2022-08-19: qty 2

## 2022-08-19 MED ORDER — HYDROCODONE-ACETAMINOPHEN 10-325 MG PO TABS
1.0000 | ORAL_TABLET | Freq: Four times a day (QID) | ORAL | Status: DC | PRN
  Administered 2022-08-19: 1 via ORAL
  Filled 2022-08-19: qty 1

## 2022-08-19 MED ORDER — DIAZEPAM 5 MG PO TABS
5.0000 mg | ORAL_TABLET | Freq: Every day | ORAL | Status: DC
  Administered 2022-08-20: 5 mg via ORAL
  Filled 2022-08-19: qty 1

## 2022-08-19 MED ORDER — ORAL CARE MOUTH RINSE: 15.0000 mL | OROMUCOSAL | Status: DC | PRN

## 2022-08-19 NOTE — Plan of Care (Signed)
  Problem: Activity: Goal: Ability to tolerate increased activity will improve Outcome: Progressing   Problem: Respiratory: Goal: Ability to maintain adequate ventilation will improve Outcome: Progressing   Problem: Respiratory: Goal: Ability to maintain a clear airway will improve Outcome: Progressing   Problem: Education: Goal: Knowledge of General Education information will improve Description: Including pain rating scale, medication(s)/side effects and non-pharmacologic comfort measures Outcome: Progressing   Problem: Health Behavior/Discharge Planning: Goal: Ability to manage health-related needs will improve Outcome: Progressing   Problem: Clinical Measurements: Goal: Ability to maintain clinical measurements within normal limits will improve Outcome: Progressing   Problem: Clinical Measurements: Goal: Diagnostic test results will improve Outcome: Progressing   Problem: Nutrition: Goal: Adequate nutrition will be maintained Outcome: Progressing   Problem: Coping: Goal: Level of anxiety will decrease Outcome: Progressing   Problem: Elimination: Goal: Will not experience complications related to bowel motility Outcome: Progressing

## 2022-08-19 NOTE — Progress Notes (Signed)
  PROGRESS NOTE    Grant Young  HQI:[id number] DOB: 1946/04/30 DOA: 08/18/2022 PCP: [name], NP  133A/133A-AA  LOS: 1 day   Brief hospital course:   Assessment & Plan: Grant Young is a 76 y.o. male with medical history significant of hypertension, hyperlipidemia, depression with anxiety, s/p of spinal cord stimulator, CAD with stent placement, chronic pain, IBS, RLS, migraine, who presents just short of breath.   Patient states that he has cough and shortness of breath since 02/2022.  It has been going on intermittently.  Patient was seen by pulmonologist, Dr. Raul Del 9/29.  Patient had CT-chest which showed multi lobar airspace consolidation with associated hypermetabolism, An infectious etiology, namely atypical/fungal, strongly was suspected. He reports that he went to [place] on a river cruise from 11/17 until 11/27.  He has developed fever with a Tmax of 103 F last week, as well as a couple of days ago.  Patient continues to have SOB and cough with clear mucus production.   Viral pneumonia  --Dr. Dr. Delaine Lame of infectious disease and Dr. Lanney Gins of pulmonology consulted on admission.  Per Dr. Delaine Lame, to hold further Abx.  --RVP pos for Rhinovirus/enterovirus. Plan: --no need for abx --supportive care   Essential hypertension -IV hydralazine as needed --cont chlorthalidone   Hyperlipidemia LDL goal <70 -Zetia -Patient is also getting Repatha   CAD (coronary artery disease) -ASA, zetia, plavix   Hypokalemia:  --monitor and replete PRN   Anxiety and depression -Continue home Lexapro, nortriptyline and valium  Hx of migraine --cont home Atogepant (wife to bring home supply for [name] to give here).  Chronic pain on chronic opioids --cont home Norco  Protein-calorie malnutrition, moderate ([place]): Body weight 63.5 kg, BMI 20.67, lost several pounds recently -Ensure   DVT prophylaxis: Lovenox SQ Code Status: Full code  Family Communication: wife  updated at bedside today Level of care: Med-Surg Dispo:   The patient is from: home Anticipated d/c is to: home Anticipated d/c date is: tomorrow   Subjective and Interval History:  On room air today.    RVP returned pos for Rhinovirus/enterovirus.   Objective: Vitals:   08/18/22 1816 08/18/22 2326 08/19/22 0809 08/19/22 1643  BP: 136/76 120/73 122/82 116/74  Pulse: 75 77 76 79  Resp: '18 18 16 16  '$ Temp: 98.6 F (37 C) 98.4 F (36.9 C) 98 F (36.7 C) 98 F (36.7 C)  TempSrc:      SpO2: 100% 97% 95% 97%  Weight:      Height:       No intake or output data in the 24 hours ending 08/19/22 2015 Filed Weights   08/18/22 1141  Weight: 63.5 kg    Examination:   Constitutional: NAD, AAOx3 HEENT: conjunctivae and lids normal, EOMI CV: No cyanosis.   RESP: normal respiratory effort, on RA Neuro: II - XII grossly intact.   Psych: Normal mood and affect.  Appropriate judgement and reason   Data Reviewed: I have personally reviewed labs and imaging studies  Time spent: 50 minutes  Enzo Bi, MD [facility] If 7PM-7AM, please contact night-coverage 08/19/2022, 8:15 PM

## 2022-08-19 NOTE — Progress Notes (Signed)
Date of Admission:  08/18/2022      ID: Grant Young is a 76 y.o. malePrincipal Problem:   Atypical pneumonia Active Problems:   Hyperlipidemia LDL goal <70   Essential hypertension   Anxiety and depression   CAD (coronary artery disease)   Hypokalemia   Protein-calorie malnutrition, moderate (HCC)    Subjective: No fever since admisison Some cough Wife at bed side He takes testosterone and anastrazole ( to reduce estrogen-because eof testosterone_ HE is on Pyridostigmine for postural hypotension causing dizziness He is also on chlorthalidone for HTN  Medications:   acidophilus  1 capsule Oral Daily   [START ON 08/20/2022] anastrozole  0.5 mg Oral Weekly   aspirin  81 mg Oral Daily   Atogepant  60 mg Oral Q1400   chlorthalidone  25 mg Oral Daily   cholecalciferol  2,000 Units Oral Daily   clopidogrel  75 mg Oral Daily   cyanocobalamin  5,000 mcg Oral Daily   diazepam  10 mg Oral QHS   diazepam  5-10 mg Oral Daily   enoxaparin (LOVENOX) injection  40 mg Subcutaneous Q24H   escitalopram  20 mg Oral Daily   ezetimibe  10 mg Oral Daily   feeding supplement  237 mL Oral BID BM   ferrous sulfate  325 mg Oral Q M,W,F   melatonin  5 mg Oral QHS   nortriptyline  25 mg Oral QHS   pantoprazole  40 mg Oral BID   propranolol ER  60 mg Oral BID   pyridostigmine  30 mg Oral BID    Objective: Vital signs in last 24 hours: Patient Vitals for the past 24 hrs:  BP Temp Temp src Pulse Resp SpO2  08/19/22 0809 122/82 98 F (36.7 C) -- 76 16 95 %  08/18/22 2326 120/73 98.4 F (36.9 C) -- 77 18 97 %  08/18/22 1816 136/76 98.6 F (37 C) -- 75 18 100 %  08/18/22 1710 111/71 98.3 F (36.8 C) Oral 71 17 98 %  08/18/22 1607 123/84 97.9 F (36.6 C) Oral 70 18 95 %      PHYSICAL EXAM:  General: Alert, cooperative, no distress, appears stated age.  Lungs:b/l air entry-few basal crepts Heart: Regular rate and rhythm, no murmur, rub or gallop. Abdomen: Soft, non-tender,not  distended. Bowel sounds normal. No masses Extremities: atraumatic, no cyanosis. No edema. No clubbing Skin: No rashes or lesions. Or bruising Lymph: Cervical, supraclavicular normal. Neurologic: Grossly non-focal  Lab Results Recent Labs    08/18/22 1156 08/19/22 0549  WBC 11.2* 8.3  HGB 15.1 14.0  HCT 46.3 42.7  NA 136 138  K 3.1* 3.3*  CL 101 101  CO2 28 30  BUN 17 12  CREATININE 0.75 0.75   Liver Panel Recent Labs    08/18/22 1156  PROT 6.6  ALBUMIN 2.9*  AST 21  ALT 19  ALKPHOS 92  BILITOT 0.8   Sedimentation Rate No results for input(s): "ESRSEDRATE" in the last 72 hours. C-Reactive Protein Recent Labs    08/18/22 1812  CRP 22.9*    Microbiology: Resp viral pCR sent Studies/Results: CT Chest W Contrast  Result Date: 08/18/2022 CLINICAL DATA:  Fever, shortness of breath, hypoxia, cough EXAM: CT CHEST WITH CONTRAST TECHNIQUE: Multidetector CT imaging of the chest was performed during intravenous contrast administration. RADIATION DOSE REDUCTION: This exam was performed according to the departmental dose-optimization program which includes automated exposure control, adjustment of the mA and/or kV according to patient size and/or  use of iterative reconstruction technique. CONTRAST:  27m OMNIPAQUE IOHEXOL 300 MG/ML  SOLN COMPARISON:  Previous studies including the CT done on 018-Aug-2023FINDINGS: Cardiovascular: There is homogeneous enhancement in thoracic aorta. 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 There are no intraluminal filling defects in central pulmonary artery branches. Coronary artery calcifications are seen. Mediastinum/Nodes: No new significant lymphadenopathy is seen. Lungs/Pleura: Patchy infiltrates are seen in right upper lobe, right middle lobe, right lower lobe and left upper lobe. Small patchy infiltrates are seen in left lower lobe. There is interval decrease in size of pleural-based infiltrate in the posterior right lower  lobe. There is interval increase in infiltrates in the rest of the lung fields. There are scattered nodular densities in both lungs with interval increase in number. There is minimal right pleural effusion. There is no pneumothorax. Upper Abdomen: There is decreased density in liver in comparison to the spleen. Small hiatal hernia is seen. Musculoskeletal: There is neurostimulator lead in thoracic spinal canal. No acute findings are seen in bony structures. IMPRESSION: Patchy infiltrates are seen in both lungs, more so in the right lung. There are multiple noncalcified nodules of varying sizes in both lungs with interval increase in number and size. There is interval decrease in size of mass like infiltrate in the posterior right lower lobe. Findings may suggest worsening multifocal pneumonia. Possibility of underlying neoplastic process is not excluded. Short-term follow-up CT in 3 months should be considered. There is minimal right pleural effusion. Coronary artery calcifications are seen. 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 Recommend annual imaging followup by CTA or MRA. This recommendation follows 2010 ACCF/AHA/AATS/ACR/ASA/SCA/SCAI/SIR/STS/SVM Guidelines for the Diagnosis and Management of Patients with Thoracic Aortic Disease. Circulation. 2010; 121:: V371-G626 Aortic aneurysm NOS (ICD10-I71.9) Small hiatal hernia. Other findings as described in the body of the report. Electronically Signed   By: PElmer PickerM.D.   On: 08/18/2022 14:29   DG Chest 2 View  Result Date: 08/18/2022 CLINICAL DATA:  Shortness of breath EXAM: CHEST - 2 VIEW COMPARISON:  04/15/2022 FINDINGS: The heart size and mediastinal contours are within normal limits. Extensive, heterogeneous and somewhat nodular airspace opacity of the right midlung and bilateral lung bases, which is increased compared to prior examination dated 04/15/2022. The visualized skeletal structures are unremarkable. IMPRESSION:  Extensive, heterogeneous and somewhat nodular airspace opacity of the right midlung and bilateral lung bases, which is increased compared to prior examination dated 04/15/2022. Findings are most consistent with worsened infection or aspiration. Electronically Signed   By: ADelanna AhmadiM.D.   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  Polypharmacy ( See above) Dicussed the management with the patient and wife  in detail Discussed with care tam ?

## 2022-08-19 NOTE — Progress Notes (Signed)
PULMONOLOGY         Date: 08/19/2022,   MRN# [id number] Grant Young [birth year]     AdmissionWeight: 63.5 kg                 CurrentWeight: 63.5 kg  Referring provider: Dr Blaine Hamper   CHIEF COMPLAINT:    Recurrent pneumonia  HISTORY OF PRESENT ILLNESS   This is a pleasant 76 yo M known to [name] at [facility] pulmonary with recent evaluation by Dr Raul Del in clinic. He has a background history of anxety disorder, chronic back pain, GERD, nephrolithiasis, HTN, substance abuse who had evaluation for RLL pneumonia and is s/p CT chest with follow up PET scan on outpatient showing RLL hypermetabolic focus with scattered bilateral infiltrates suggestive of infection. His PET scan showed interval improvement and radiologist suspected atypical or possible fungal etiology as most likely culprit.  He had repeat CT chest on this admission and this showed further improvement in RLL opacity, however there is also findings of nodular infiltrates which are apparently not improved. PCCM consult for additional evaluation and management.   08/19/22- patient is improved very slightly,  I have re-ordered RVP it was not collected.  His resp culture is back with GPCs , I have ordered this to be re-collected for additional microbiology.  ID is following appreciate recommendations.  CRP is highly elevated and I still think this is infectious in etiology based on evidence thus far.   PAST MEDICAL HISTORY   Past Medical History:  Diagnosis Date   Anxiety    Per New Patient Packet   Chronic back pain    Per New Patient Packet   Chronic heart disease    Per New Patient Packet   Depression    GERD (gastroesophageal reflux disease)    Headache    History of gastritis    Per New Patient Packet   History of neuropathy    Per New Patient Packet   Hypertension    Per New Patient Packet   Insomnia    Kidney stone    Lumbar radicular pain    Per New Patient Packet   Lyme disease    Per New Patient Packet    Myocardial infarction [facility])    Overactive bladder    PONV (postoperative nausea and vomiting)    Skin cancer, basal cell    Spinal cord stimulator status    01/08/21 - not currently using.   Squamous cell skin cancer    Substance abuse ([place])      SURGICAL HISTORY   Past Surgical History:  Procedure Laterality Date   BACK SURGERY     CARDIAC CATHETERIZATION     CATARACT EXTRACTION W/PHACO Left 01/14/2021   Procedure: CATARACT EXTRACTION PHACO AND INTRAOCULAR LENS PLACEMENT ([place]) LEFT VIVITY TORIC LENS 8.75 00:56.7;  Surgeon: Birder Robson, MD;  Location: [place];  Service: Ophthalmology;  Laterality: Left;   CATARACT EXTRACTION W/PHACO Right 01/28/2021   Procedure: CATARACT EXTRACTION PHACO AND INTRAOCULAR LENS PLACEMENT ([place]) RIGHT VIVITY TORIC LENS;  Surgeon: Birder Robson, MD;  Location: [place];  Service: Ophthalmology;  Laterality: Right;  6.54 00:46.4   CHOLECYSTECTOMY  2017   COLONOSCOPY  09/15/2015   Per New Patient Packet   COLONOSCOPY WITH PROPOFOL N/A 10/03/2020   Procedure: COLONOSCOPY WITH PROPOFOL;  Surgeon: [name], MD;  Location: [facility] ENDOSCOPY;  Service: Gastroenterology;  Laterality: N/A;   CORONARY/GRAFT ACUTE MI REVASCULARIZATION N/A 11/27/2021   Procedure: Coronary/Graft Acute MI Revascularization;  Surgeon: Leonie Man, MD;  Location: [facility];  Service: Cardiovascular;  Laterality: N/A;   ESOPHAGOGASTRODUODENOSCOPY (EGD) WITH PROPOFOL N/A 10/03/2020   Procedure: ESOPHAGOGASTRODUODENOSCOPY (EGD) WITH PROPOFOL;  Surgeon: [name], MD;  Location: [facility] ENDOSCOPY;  Service: Gastroenterology;  Laterality: N/A;   FOOT SURGERY Bilateral 03/18/2020   FOOT SURGERY  08/032021   GALLBLADDER SURGERY  09/15/2015   Gallbladder Removal. Procedure done by Dr.Beverly. Per New Patient Packet   KIDNEY STONE SURGERY  09/14/1980   Too many to count. Per New Patient Packet 09/14/1980-09/15/1995   KIDNEY STONE SURGERY  09/15/1995    Too many to count. Per New Patient Packet   LEFT HEART CATH AND CORONARY ANGIOGRAPHY N/A 11/27/2021   Procedure: LEFT HEART CATH AND CORONARY ANGIOGRAPHY;  Surgeon: Leonie Man, MD;  Location: [place] [facility];  Service: Cardiovascular;  Laterality: N/A;   LITHOTRIPSY  09/14/2014   Per New Patient Packet   LITHOTRIPSY  09/14/1996   No Stints Used. Per New Patient Packet   LITHOTRIPSY  09/14/1997   No Stints used. Per New Patient Packet   PAIN PUMP IMPLANTATION     PAIN PUMP REMOVAL     SHOULDER SURGERY Right 1984   SIGMOIDOSCOPY  09/14/2017   Per New Patient Packet   SPINAL CORD STIMULATOR IMPLANT     TONSILLECTOMY     UPPER GI ENDOSCOPY       FAMILY HISTORY   Family History  Problem Relation Age of Onset   Heart disease Father    Heart failure Father    Hypertension Father    Stroke Father    Stroke Mother    Dementia Mother    Kidney Stones Daughter    Anxiety disorder Daughter    OCD Daughter      SOCIAL HISTORY   Social History   Tobacco Use   Smoking status: Never    Passive exposure: Never   Smokeless tobacco: Never  Vaping Use   Vaping Use: Never used  Substance Use Topics   Alcohol use: Yes    Comment: 1 Drink a Month, socially   Drug use: Not Currently     MEDICATIONS    Home Medication:     Current Medication:  Current Facility-Administered Medications:    acetaminophen (TYLENOL) tablet 650 mg, 650 mg, Oral, Q6H PRN, Ivor Costa, MD   acidophilus (RISAQUAD) capsule 1 capsule, 1 capsule, Oral, Daily, Ivor Costa, MD, 1 capsule at 08/18/22 1816   albuterol (PROVENTIL) (2.5 MG/3ML) 0.083% nebulizer solution 2.5 mg, 2.5 mg, Nebulization, Q4H PRN, Alison Murray, RPH   [START ON 08/20/2022] anastrozole (ARIMIDEX) tablet 0.5 mg, 0.5 mg, Oral, Weekly, Ivor Costa, MD   aspirin chewable tablet 81 mg, 81 mg, Oral, Daily, Ivor Costa, MD   Atogepant TABS 60 mg, 60 mg, Oral, Q1400, Ivor Costa, MD   butalbital-acetaminophen-caffeine (FIORICET)  50-325-40 MG per tablet 1 tablet, 1 tablet, Oral, Q6H PRN, Ivor Costa, MD   chlorthalidone (HYGROTON) tablet 25 mg, 25 mg, Oral, Daily, Ivor Costa, MD   cholecalciferol (VITAMIN D3) 25 MCG (1000 UNIT) tablet 2,000 Units, 2,000 Units, Oral, Daily, Ivor Costa, MD, 2,000 Units at 08/18/22 1817   clopidogrel (PLAVIX) tablet 75 mg, 75 mg, Oral, Daily, Ivor Costa, MD   dextromethorphan-guaiFENesin ([place] DM) 30-600 MG per 12 hr tablet 1 tablet, 1 tablet, Oral, BID PRN, Ivor Costa, MD, 1 tablet at 08/19/22 0720   diazepam (VALIUM) tablet 10 mg, 10 mg, Oral, QHS, Ivor Costa, MD, 10 mg  at 08/18/22 2226   diazepam (VALIUM) tablet 5-10 mg, 5-10 mg, Oral, Daily, Ivor Costa, MD   enoxaparin (LOVENOX) injection 40 mg, 40 mg, Subcutaneous, Q24H, Ivor Costa, MD, 40 mg at 08/18/22 1618   escitalopram (LEXAPRO) tablet 20 mg, 20 mg, Oral, Daily, Ivor Costa, MD, 20 mg at 08/18/22 1816   ezetimibe (ZETIA) tablet 10 mg, 10 mg, Oral, Daily, Ivor Costa, MD, 10 mg at 08/18/22 1817   feeding supplement (ENSURE ENLIVE / ENSURE PLUS) liquid 237 mL, 237 mL, Oral, BID BM, Ivor Costa, MD   ferrous sulfate tablet 325 mg, 325 mg, Oral, Q M,W,F, Ivor Costa, MD   hydrALAZINE (APRESOLINE) injection 5 mg, 5 mg, Intravenous, Q2H PRN, Ivor Costa, MD   [START ON 08/22/2022] HYDROcodone-acetaminophen ([place]) 10-325 MG per tablet 1 tablet, 1 tablet, Oral, Q8H PRN, Ivor Costa, MD   loperamide (IMODIUM) capsule 2 mg, 2 mg, Oral, BID PRN, Ivor Costa, MD   melatonin tablet 5 mg, 5 mg, Oral, QHS, Ivor Costa, MD, 5 mg at 08/18/22 2227   nortriptyline (PAMELOR) capsule 25 mg, 25 mg, Oral, QHS, Ivor Costa, MD, 25 mg at 08/18/22 2228   ondansetron (ZOFRAN) injection 4 mg, 4 mg, Intravenous, Q8H PRN, Ivor Costa, MD   pantoprazole (PROTONIX) EC tablet 40 mg, 40 mg, Oral, BID, Ivor Costa, MD, 40 mg at 08/18/22 2227   propranolol ER (INDERAL LA) 24 hr capsule 60 mg, 60 mg, Oral, BID, Ivor Costa, MD, 60 mg at 08/18/22 2229   pyridostigmine (MESTINON)  tablet 30 mg, 30 mg, Oral, BID, Ivor Costa, MD, 30 mg at 08/18/22 2228   vitamin B-12 (CYANOCOBALAMIN) tablet 5,000 mcg, 5,000 mcg, Oral, Daily, Ivor Costa, MD    ALLERGIES   Ace inhibitors, Fluoxetine, Metoclopramide, Nalbuphine, Other, Amoxicillin-pot clavulanate, Doxazosin, Duloxetine, Penicillins, Tamsulosin, Trazodone and nefazodone, Amlodipine, Cinoxacin, Ciprofloxacin, Fludrocortisone, Nebivolol, Olanzapine, Olmesartan, Pregabalin, Prostaglandins, Thyroid hormones, Zolpidem, Duloxetine hcl, Fluoxetine hcl, Nucynta [tapentadol], and Phenytoin     REVIEW OF SYSTEMS    Review of Systems:  Gen:  Denies  fever, sweats, chills weigh loss  HEENT: Denies blurred vision, double vision, ear pain, eye pain, hearing loss, nose bleeds, sore throat Cardiac:  No dizziness, chest pain or heaviness, chest tightness,edema Resp:   reports dyspnea chronically  Gi: Denies swallowing difficulty, stomach pain, nausea or vomiting, diarrhea, constipation, bowel incontinence Gu:  Denies bladder incontinence, burning urine Ext:   Denies Joint pain, stiffness or swelling Skin: Denies  skin rash, easy bruising or bleeding or hives Endoc:  Denies polyuria, polydipsia , polyphagia or weight change Psych:   Denies depression, insomnia or hallucinations   Other:  All other systems negative   VS: BP 120/73 (BP Location: Left Arm)   Pulse 77   Temp 98.4 F (36.9 C)   Resp 18   Ht _0  (1.753 m)   Wt 63.5 kg   SpO2 97%   BMI 20.67 kg/m      PHYSICAL EXAM    GENERAL:NAD, no fevers, chills, no weakness no fatigue HEAD: Normocephalic, atraumatic.  EYES: Pupils equal, round, reactive to light. Extraocular muscles intact. No scleral icterus.  MOUTH: Moist mucosal membrane. Dentition intact. No abscess noted.  EAR, NOSE, THROAT: Clear without exudates. No external lesions.  NO THRUSH NECK: Supple. No thyromegaly. No nodules. No JVD.  PULMONARY: decreased breath sounds with mild rhonchi worse at  bases bilaterally.  CARDIOVASCULAR: S1 and S2. Regular rate and rhythm. No murmurs, rubs, or gallops. No edema. Pedal pulses 2+ bilaterally.  GASTROINTESTINAL:  Soft, nontender, nondistended. No masses. Positive bowel sounds. No hepatosplenomegaly.  MUSCULOSKELETAL: No swelling, clubbing, or edema. Range of motion full in all extremities.  NEUROLOGIC: Cranial nerves II through XII are intact. No gross focal neurological deficits. Sensation intact. Reflexes intact.  SKIN: No ulceration, lesions, rashes, or cyanosis. Skin warm and dry. Turgor intact. NO RASH  PSYCHIATRIC: Mood, affect within normal limits. The patient is awake, alert and oriented x 3. Insight, judgment intact.       IMAGING     ASSESSMENT/PLAN    Multifocal pneumonia - present on admission  - COVID19 negative  - supplemental O2 during my evaluation - room air - respiratory culture -  gram positive cocci - MRSA pcr was negative  -serum fungitell -legionella ab -strep pneumoniae ur AG- negative -Histoplasma Ur Ag -procalcitonin - 0.17 mildly elevated  -AFB sputum expectorated specimen -sputum cytology  -reviewed pertinent imaging with patient today - ESR/CRP-markedly elevated  -PT/OT for d/c planning  -appreciate ID consultation  -please encourage patient to use incentive spirometer few times each hour while hospitalized.               Thank you for allowing me to participate in the care of this patient.   Patient/Family are satisfied with care plan and all questions have been answered.    Provider disclosure: Patient with at least one acute or chronic illness or injury that poses a threat to life or bodily function and is being managed actively during this encounter.  All of the below services have been performed independently by signing provider:  review of prior documentation from internal and or external health records.  Review of previous and current lab results.  Interview and comprehensive  assessment during patient visit today. Review of current and previous chest radiographs/CT scans. Discussion of management and test interpretation with health care team and patient/family.   This document was prepared using Dragon voice recognition software and may include unintentional dictation errors.     Ottie Glazier, M.D.  Division of Pulmonary & Critical Care Medicine

## 2022-08-20 ENCOUNTER — Ambulatory Visit: Payer: Medicare Other

## 2022-08-20 DIAGNOSIS — J189 Pneumonia, unspecified organism: Secondary | ICD-10-CM | POA: Diagnosis not present

## 2022-08-20 LAB — BASIC METABOLIC PANEL
Anion gap: 6 (ref 5–15)
BUN: 14 mg/dL (ref 8–23)
CO2: 28 mmol/L (ref 22–32)
Calcium: 9 mg/dL (ref 8.9–10.3)
Chloride: 105 mmol/L (ref 98–111)
Creatinine, Ser: 0.58 mg/dL — ABNORMAL LOW (ref 0.61–1.24)
GFR, Estimated: >60 mL/min (ref >60)
Glucose, Bld: 100 mg/dL — ABNORMAL HIGH (ref 70–99)
Potassium: 3.4 mmol/L — ABNORMAL LOW (ref 3.5–5.1)
Sodium: 139 mmol/L (ref 135–145)

## 2022-08-20 LAB — ANA COMPREHENSIVE PANEL

## 2022-08-20 LAB — CBC
HCT: 42.5 % (ref 39.0–52.0)
Hemoglobin: 14.2 g/dL (ref 13.0–17.0)
MCH: 28.6 pg (ref 26.0–34.0)
MCHC: 33.4 g/dL (ref 30.0–36.0)
MCV: 85.7 fL (ref 80.0–100.0)
Platelets: 397 10*3/uL (ref 150–400)
RBC: 4.96 MIL/uL (ref 4.22–5.81)
RDW: 12.7 % (ref 11.5–15.5)
WBC: 8.7 10*3/uL (ref 4.0–10.5)
nRBC: 0 % (ref 0.0–0.2)

## 2022-08-20 LAB — LEGIONELLA PNEUMOPHILA SEROGP 1 UR AG: L. pneumophila Serogp 1 Ur Ag: NEGATIVE

## 2022-08-20 LAB — ANCA PROFILE
Anti-MPO Antibodies: <0.2 units (ref 0.0–0.9)
Anti-PR3 Antibodies: <0.2 units (ref 0.0–0.9)
Atypical P-ANCA titer: <1:20 {titer}
C-ANCA: <1:20 {titer}
P-ANCA: <1:20 {titer}

## 2022-08-20 LAB — ANGIOTENSIN CONVERTING ENZYME: Angiotensin-Converting Enzyme: 35 U/L (ref 14–82)

## 2022-08-20 LAB — MAGNESIUM: Magnesium: 2 mg/dL (ref 1.7–2.4)

## 2022-08-20 LAB — HISTOPLASMA ANTIGEN, URINE: Histoplasma Antigen, urine: <0.5 (ref <0.5)

## 2022-08-20 LAB — CRYPTOCOCCUS ANTIGEN, SERUM: Cryptococcus Antigen, Serum: NEGATIVE

## 2022-08-20 MED ORDER — POTASSIUM CHLORIDE CRYS ER 20 MEQ PO TBCR
40.0000 meq | EXTENDED_RELEASE_TABLET | Freq: Once | ORAL | Status: AC
  Administered 2022-08-20: 40 meq via ORAL
  Filled 2022-08-20: qty 2

## 2022-08-20 NOTE — Progress Notes (Signed)
PULMONOLOGY         Date: 08/20/2022,   MRN# [id number] Grant Young 15-Jun-1946     AdmissionWeight: 63.5 kg                 CurrentWeight: 63.5 kg  Referring provider: Dr Blaine Hamper   CHIEF COMPLAINT:    Recurrent pneumonia  HISTORY OF PRESENT ILLNESS   This is a pleasant 76 yo M known to Grant Young at [facility] pulmonary with recent evaluation by Dr Raul Del in clinic. He has a background history of anxety disorder, chronic back pain, GERD, nephrolithiasis, HTN, substance abuse who had evaluation for RLL pneumonia and is s/p CT chest with follow up PET scan on outpatient showing RLL hypermetabolic focus with scattered bilateral infiltrates suggestive of infection. His PET scan showed interval improvement and radiologist suspected atypical or possible fungal etiology as most likely culprit.  He had repeat CT chest on this admission and this showed further improvement in RLL opacity, however there is also findings of nodular infiltrates which are apparently not improved. PCCM consult for additional evaluation and management.   08/19/22- patient is improved very slightly,  I have re-ordered RVP it was not collected.  His resp culture is back with GPCs , I have ordered this to be re-collected for additional microbiology.  ID is following appreciate recommendations.  CRP is highly elevated and I still think this is infectious in etiology based on evidence thus far.   08/20/22- patient has viral LRTI with rhinovirus.  He needs tessalon 200 for supportive care with cough, gently oral rehydration and bed rest for 2 more weeks.  PAST MEDICAL HISTORY   Past Medical History:  Diagnosis Date   Anxiety    Per New Patient Packet   Chronic back pain    Per New Patient Packet   Chronic heart disease    Per New Patient Packet   Depression    GERD (gastroesophageal reflux disease)    Headache    History of gastritis    Per New Patient Packet   History of neuropathy    Per New Patient Packet    Hypertension    Per New Patient Packet   Insomnia    Kidney stone    Lumbar radicular pain    Per New Patient Packet   Lyme disease    Per New Patient Packet   Myocardial infarction [facility])    Overactive bladder    PONV (postoperative nausea and vomiting)    Skin cancer, basal cell    Spinal cord stimulator status    01/08/21 - not currently using.   Squamous cell skin cancer    Substance abuse ([place])      SURGICAL HISTORY   Past Surgical History:  Procedure Laterality Date   BACK SURGERY     CARDIAC CATHETERIZATION     CATARACT EXTRACTION W/PHACO Left 01/14/2021   Procedure: CATARACT EXTRACTION PHACO AND INTRAOCULAR LENS PLACEMENT ([place]) LEFT VIVITY TORIC LENS 8.75 00:56.7;  Surgeon: Birder Robson, MD;  Location: [place];  Service: Ophthalmology;  Laterality: Left;   CATARACT EXTRACTION W/PHACO Right 01/28/2021   Procedure: CATARACT EXTRACTION PHACO AND INTRAOCULAR LENS PLACEMENT ([place]) RIGHT VIVITY TORIC LENS;  Surgeon: Birder Robson, MD;  Location: [place];  Service: Ophthalmology;  Laterality: Right;  6.54 00:46.4   CHOLECYSTECTOMY  2017   COLONOSCOPY  09/15/2015   Per New Patient Packet   COLONOSCOPY WITH PROPOFOL N/A 10/03/2020   Procedure: COLONOSCOPY WITH PROPOFOL;  Surgeon:  [name], MD;  Location[facility] ENDOSCOPY;  Service: Gastroenterology;  Laterality: N/A;   CORONARY/GRAFT ACUTE MI REVASCULARIZATION N/A 11/27/2021   Procedure: Coronary/Graft Acute MI Revascularization;  Surgeon: Leonie Man, MD;  Location: [place] CV LAB;  Service: Cardiovascular;  Laterality: N/A;   ESOPHAGOGASTRODUODENOSCOPY (EGD) WITH PROPOFOL N/A 10/03/2020   Procedure: ESOPHAGOGASTRODUODENOSCOPY (EGD) WITH PROPOFOL;  Surgeon: [name], MD;  Location: [facility] ENDOSCOPY;  Service: Gastroenterology;  Laterality: N/A;   FOOT SURGERY Bilateral 03/18/2020   FOOT SURGERY  08/032021   GALLBLADDER SURGERY  09/15/2015   Gallbladder Removal. Procedure done by  Dr.Beverly. Per New Patient Packet   KIDNEY STONE SURGERY  09/14/1980   Too many to count. Per New Patient Packet 09/14/1980-09/15/1995   KIDNEY STONE SURGERY  09/15/1995   Too many to count. Per New Patient Packet   LEFT HEART CATH AND CORONARY ANGIOGRAPHY N/A 11/27/2021   Procedure: LEFT HEART CATH AND CORONARY ANGIOGRAPHY;  Surgeon: Leonie Man, MD;  Location: [place] CV LAB;  Service: Cardiovascular;  Laterality: N/A;   LITHOTRIPSY  09/14/2014   Per New Patient Packet   LITHOTRIPSY  09/14/1996   No Stints Used. Per New Patient Packet   LITHOTRIPSY  09/14/1997   No Stints used. Per New Patient Packet   PAIN PUMP IMPLANTATION     PAIN PUMP REMOVAL     SHOULDER SURGERY Right 1984   SIGMOIDOSCOPY  09/14/2017   Per New Patient Packet   SPINAL CORD STIMULATOR IMPLANT     TONSILLECTOMY     UPPER GI ENDOSCOPY       FAMILY HISTORY   Family History  Problem Relation Age of Onset   Heart disease Father    Heart failure Father    Hypertension Father    Stroke Father    Stroke Mother    Dementia Mother    Kidney Stones Daughter    Anxiety disorder Daughter    OCD Daughter      SOCIAL HISTORY   Social History   Tobacco Use   Smoking status: Never    Passive exposure: Never   Smokeless tobacco: Never  Vaping Use   Vaping Use: Never used  Substance Use Topics   Alcohol use: Yes    Comment: 1 Drink a Month, socially   Drug use: Not Currently     MEDICATIONS    Home Medication:     Current Medication:  Current Facility-Administered Medications:    acetaminophen (TYLENOL) tablet 650 mg, 650 mg, Oral, Q6H PRN, Ivor Costa, MD   acidophilus (RISAQUAD) capsule 1 capsule, 1 capsule, Oral, Daily, Ivor Costa, MD, 1 capsule at 08/19/22 1009   albuterol (PROVENTIL) (2.5 MG/3ML) 0.083% nebulizer solution 2.5 mg, 2.5 mg, Nebulization, Q4H PRN, Alison Murray, RPH   anastrozole (ARIMIDEX) tablet 0.5 mg, 0.5 mg, Oral, Weekly, Ivor Costa, MD   aspirin chewable  tablet 81 mg, 81 mg, Oral, Daily, Ivor Costa, MD, 81 mg at 08/19/22 1009   Atogepant TABS 60 mg, 60 mg, Oral, QHS, Enzo Bi, MD, 60 mg at 08/19/22 2131   butalbital-acetaminophen-caffeine (FIORICET) 50-325-40 MG per tablet 1 tablet, 1 tablet, Oral, Q6H PRN, Ivor Costa, MD   chlorthalidone (HYGROTON) tablet 25 mg, 25 mg, Oral, Daily, Ivor Costa, MD, 25 mg at 08/19/22 1010   cholecalciferol (VITAMIN D3) 25 MCG (1000 UNIT) tablet 2,000 Units, 2,000 Units, Oral, Daily, Ivor Costa, MD, 2,000 Units at 08/19/22 1007   clopidogrel (PLAVIX) tablet 75 mg, 75 mg, Oral, Daily, Ivor Costa, MD, 75  mg at 08/19/22 1007   cyanocobalamin (VITAMIN B12) tablet 5,000 mcg, 5,000 mcg, Oral, Daily, Ivor Costa, MD, 5,000 mcg at 08/19/22 1007   dextromethorphan-guaiFENesin ([place] DM) 30-600 MG per 12 hr tablet 1 tablet, 1 tablet, Oral, BID PRN, Ivor Costa, MD, 1 tablet at 08/19/22 2131   diazepam (VALIUM) tablet 10 mg, 10 mg, Oral, QHS, Ivor Costa, MD, 10 mg at 08/19/22 2128   diazepam (VALIUM) tablet 5 mg, 5 mg, Oral, Daily, Enzo Bi, MD   enoxaparin (LOVENOX) injection 40 mg, 40 mg, Subcutaneous, Q24H, Ivor Costa, MD, 40 mg at 08/19/22 1403   escitalopram (LEXAPRO) tablet 20 mg, 20 mg, Oral, Daily, Ivor Costa, MD, 20 mg at 08/19/22 1008   ezetimibe (ZETIA) tablet 10 mg, 10 mg, Oral, Daily, Ivor Costa, MD, 10 mg at 08/19/22 1010   feeding supplement (ENSURE ENLIVE / ENSURE PLUS) liquid 237 mL, 237 mL, Oral, BID BM, Ivor Costa, MD, 237 mL at 08/19/22 1403   ferrous sulfate tablet 325 mg, 325 mg, Oral, Q M,W,F, Ivor Costa, MD, 325 mg at 08/19/22 1007   hydrALAZINE (APRESOLINE) injection 5 mg, 5 mg, Intravenous, Q2H PRN, Ivor Costa, MD   HYDROcodone-acetaminophen [facility]) 10-325 MG per tablet 1 tablet, 1 tablet, Oral, Q6H PRN, Enzo Bi, MD, 1 tablet at 08/19/22 2139   loperamide (IMODIUM) capsule 2 mg, 2 mg, Oral, BID PRN, Ivor Costa, MD   melatonin tablet 5 mg, 5 mg, Oral, QHS, Ivor Costa, MD, 5 mg at 08/19/22 2128    nortriptyline (PAMELOR) capsule 25 mg, 25 mg, Oral, QHS, Ivor Costa, MD, 25 mg at 08/19/22 2130   ondansetron (ZOFRAN) injection 4 mg, 4 mg, Intravenous, Q8H PRN, Ivor Costa, MD   Oral care mouth rinse, 15 mL, Mouth Rinse, PRN, Enzo Bi, MD   pantoprazole (PROTONIX) EC tablet 40 mg, 40 mg, Oral, BID, Ivor Costa, MD, 40 mg at 08/19/22 2130   potassium chloride SA (KLOR-CON M) CR tablet 40 mEq, 40 mEq, Oral, Once, Enzo Bi, MD   propranolol ER (INDERAL LA) 24 hr capsule 60 mg, 60 mg, Oral, BID, Ivor Costa, MD, 60 mg at 08/19/22 2128   pyridostigmine (MESTINON) tablet 30 mg, 30 mg, Oral, BID, Ivor Costa, MD, 30 mg at 08/19/22 2129    ALLERGIES   Ace inhibitors, Fluoxetine, Metoclopramide, Nalbuphine, Other, Amoxicillin-pot clavulanate, Doxazosin, Duloxetine, Penicillins, Tamsulosin, Trazodone and nefazodone, Amlodipine, Cinoxacin, Ciprofloxacin, Fludrocortisone, Nebivolol, Olanzapine, Olmesartan, Pregabalin, Prostaglandins, Thyroid hormones, Zolpidem, Duloxetine hcl, Fluoxetine hcl, Nucynta [tapentadol], and Phenytoin     REVIEW OF SYSTEMS    Review of Systems:  Gen:  Denies  fever, sweats, chills weigh loss  HEENT: Denies blurred vision, double vision, ear pain, eye pain, hearing loss, nose bleeds, sore throat Cardiac:  No dizziness, chest pain or heaviness, chest tightness,edema Resp:   reports dyspnea chronically  Gi: Denies swallowing difficulty, stomach pain, nausea or vomiting, diarrhea, constipation, bowel incontinence Gu:  Denies bladder incontinence, burning urine Ext:   Denies Joint pain, stiffness or swelling Skin: Denies  skin rash, easy bruising or bleeding or hives Endoc:  Denies polyuria, polydipsia , polyphagia or weight change Psych:   Denies depression, insomnia or hallucinations   Other:  All other systems negative   VS: BP 120/79 (BP Location: Left Arm)   Pulse 82   Temp 98.3 F (36.8 C)   Resp 16   Ht _0  (1.753 m)   Wt 63.5 kg   SpO2 93%   BMI 20.67  kg/m  PHYSICAL EXAM    GENERAL:NAD, no fevers, chills, no weakness no fatigue HEAD: Normocephalic, atraumatic.  EYES: Pupils equal, round, reactive to light. Extraocular muscles intact. No scleral icterus.  MOUTH: Moist mucosal membrane. Dentition intact. No abscess noted.  EAR, NOSE, THROAT: Clear without exudates. No external lesions.  NO THRUSH NECK: Supple. No thyromegaly. No nodules. No JVD.  PULMONARY: decreased breath sounds with mild rhonchi worse at bases bilaterally.  CARDIOVASCULAR: S1 and S2. Regular rate and rhythm. No murmurs, rubs, or gallops. No edema. Pedal pulses 2+ bilaterally.  GASTROINTESTINAL: Soft, nontender, nondistended. No masses. Positive bowel sounds. No hepatosplenomegaly.  MUSCULOSKELETAL: No swelling, clubbing, or edema. Range of motion full in all extremities.  NEUROLOGIC: Cranial nerves II through XII are intact. No gross focal neurological deficits. Sensation intact. Reflexes intact.  SKIN: No ulceration, lesions, rashes, or cyanosis. Skin warm and dry. Turgor intact. NO RASH  PSYCHIATRIC: Mood, affect within normal limits. The patient is awake, alert and oriented x 3. Insight, judgment intact.       IMAGING     ASSESSMENT/PLAN    Multifocal pneumonia due to rhinovirus - present on admission  - COVID19 negative  - supplemental O2 during my evaluation - room air - respiratory culture -  gram positive cocci - MRSA pcr was negative  -serum fungitell -legionella ab -RVP - enterovirus+/rhinovirus + -strep pneumoniae ur AG- negative -Histoplasma Ur Ag -procalcitonin - 0.17 mildly elevated  -AFB sputum expectorated specimen- NOT ordered  -sputum cytology -not ordered  -reviewed pertinent imaging with patient today - ESR/CRP-markedly elevated  -PT/OT for d/c planning  -appreciate ID consultation  -please encourage patient to use incentive spirometer few times each hour while hospitalized.               Thank you for allowing  me to participate in the care of this patient.   Patient/Family are satisfied with care plan and all questions have been answered.    Provider disclosure: Patient with at least one acute or chronic illness or injury that poses a threat to life or bodily function and is being managed actively during this encounter.  All of the below services have been performed independently by signing provider:  review of prior documentation from internal and or external health records.  Review of previous and current lab results.  Interview and comprehensive assessment during patient visit today. Review of current and previous chest radiographs/CT scans. Discussion of management and test interpretation with health care team and patient/family.   This document was prepared using Dragon voice recognition software and may include unintentional dictation errors.     Ottie Glazier, M.D.  Division of Pulmonary & Critical Care Medicine

## 2022-08-20 NOTE — Plan of Care (Signed)
Problem: Activity: Goal: Ability to tolerate increased activity will improve 08/20/2022 1005 by Alferd Apa, RN Outcome: Adequate for Discharge 08/20/2022 0745 by Alferd Apa, RN Outcome: Progressing   Problem: Clinical Measurements: Goal: Ability to maintain a body temperature in the normal range will improve 08/20/2022 1005 by Alferd Apa, RN Outcome: Adequate for Discharge 08/20/2022 0745 by Alferd Apa, RN Outcome: Progressing   Problem: Respiratory: Goal: Ability to maintain adequate ventilation will improve 08/20/2022 1005 by Alferd Apa, RN Outcome: Adequate for Discharge 08/20/2022 0745 by Alferd Apa, RN Outcome: Progressing Goal: Ability to maintain a clear airway will improve 08/20/2022 1005 by Alferd Apa, RN Outcome: Adequate for Discharge 08/20/2022 0745 by Alferd Apa, RN Outcome: Progressing   Problem: Education: Goal: Knowledge of General Education information will improve Description: Including pain rating scale, medication(s)/side effects and non-pharmacologic comfort measures 08/20/2022 1005 by Alferd Apa, RN Outcome: Adequate for Discharge 08/20/2022 0745 by Alferd Apa, RN Outcome: Progressing   Problem: Health Behavior/Discharge Planning: Goal: Ability to manage health-related needs will improve 08/20/2022 1005 by Alferd Apa, RN Outcome: Adequate for Discharge 08/20/2022 0745 by Alferd Apa, RN Outcome: Progressing   Problem: Clinical Measurements: Goal: Ability to maintain clinical measurements within normal limits will improve 08/20/2022 1005 by Alferd Apa, RN Outcome: Adequate for Discharge 08/20/2022 0745 by Alferd Apa, RN Outcome: Progressing Goal: Will remain free from infection 08/20/2022 1005 by Alferd Apa, RN Outcome: Adequate for Discharge 08/20/2022 0745 by Alferd Apa, RN Outcome: Progressing Goal: Diagnostic test results will improve 08/20/2022 1005 by Alferd Apa, RN Outcome: Adequate for  Discharge 08/20/2022 0745 by Alferd Apa, RN Outcome: Progressing Goal: Respiratory complications will improve 08/20/2022 1005 by Alferd Apa, RN Outcome: Adequate for Discharge 08/20/2022 0745 by Alferd Apa, RN Outcome: Progressing Goal: Cardiovascular complication will be avoided 08/20/2022 1005 by Alferd Apa, RN Outcome: Adequate for Discharge 08/20/2022 0745 by Alferd Apa, RN Outcome: Progressing   Problem: Activity: Goal: Risk for activity intolerance will decrease 08/20/2022 1005 by Alferd Apa, RN Outcome: Adequate for Discharge 08/20/2022 0745 by Alferd Apa, RN Outcome: Progressing   Problem: Nutrition: Goal: Adequate nutrition will be maintained 08/20/2022 1005 by Alferd Apa, RN Outcome: Adequate for Discharge 08/20/2022 0745 by Alferd Apa, RN Outcome: Progressing   Problem: Coping: Goal: Level of anxiety will decrease 08/20/2022 1005 by Alferd Apa, RN Outcome: Adequate for Discharge 08/20/2022 0745 by Alferd Apa, RN Outcome: Progressing   Problem: Elimination: Goal: Will not experience complications related to bowel motility 08/20/2022 1005 by Alferd Apa, RN Outcome: Adequate for Discharge 08/20/2022 0745 by Alferd Apa, RN Outcome: Progressing Goal: Will not experience complications related to urinary retention 08/20/2022 1005 by Alferd Apa, RN Outcome: Adequate for Discharge 08/20/2022 0745 by Alferd Apa, RN Outcome: Progressing   Problem: Pain Managment: Goal: General experience of comfort will improve 08/20/2022 1005 by Alferd Apa, RN Outcome: Adequate for Discharge 08/20/2022 0745 by Alferd Apa, RN Outcome: Progressing   Problem: Safety: Goal: Ability to remain free from injury will improve 08/20/2022 1005 by Alferd Apa, RN Outcome: Adequate for Discharge 08/20/2022 0745 by Alferd Apa, RN Outcome: Progressing   Problem: Skin Integrity: Goal: Risk for impaired skin integrity will decrease 08/20/2022 1005 by  Alferd Apa, RN Outcome: Adequate for Discharge 08/20/2022 0745 by Alferd Apa, RN Outcome: Progressing

## 2022-08-20 NOTE — Plan of Care (Signed)

## 2022-08-20 NOTE — Discharge Summary (Signed)
Physician Discharge Summary   Grant Young  male DOB: Mar 26, 1946  OXB:[id number]  PCP: [name], NP  Admit date: 08/18/2022 Discharge date: 08/20/2022  Admitted From: home Disposition:  home CODE STATUS: Full code   Hospital Course:  For full details, please see H&P, progress notes, consult notes and ancillary notes.  Briefly,  Grant Young is a 76 y.o. male with medical history significant of hypertension, depression with anxiety, presence of spinal cord stimulator, CAD with stent placement, chronic pain, IBS, migraine, who presented with cough and dyspnea.   Patient states that he has cough and shortness of breath since 02/2022.  It has been going on intermittently.  Patient was seen by pulmonologist, Dr. Raul Del 9/29.  Patient had CT-chest which showed multi lobar airspace consolidation with associated hypermetabolism. He reported that he went to [place] on a river cruise from 11/17 until 11/27.  He has developed fever with a Tmax of 103 F last week, as well as a couple of days ago.  Patient continues to have SOB and cough with clear mucus production.    Viral pneumonia 2/2 Rhinovirus/enterovirus --Dr. Dr. Delaine Lame of infectious disease and Dr. Lanney Gins of pulmonology consulted on admission.  Per Dr. Delaine Lame, hold further Abx.  --RVP pos for Rhinovirus/enterovirus. --supportive care   Essential hypertension --cont chlorthalidone   Hyperlipidemia LDL goal <70 -Zetia and Repatha   CAD (coronary artery disease) -ASA, zetia, plavix   Hypokalemia:  --monitored and repleted PRN   Anxiety and depression -Continue home Lexapro, nortriptyline and valium   Hx of migraine --cont home Atogepant    Chronic pain on chronic opioids --cont home Norco   Protein-calorie malnutrition, moderate ([place]), ruled out   Discharge Diagnoses:  Principal Problem:   Atypical pneumonia Active Problems:   Essential hypertension   Hyperlipidemia LDL goal <70   CAD  (coronary artery disease)   Hypokalemia   Anxiety and depression   Protein-calorie malnutrition, moderate ([place])   30 Day Unplanned Readmission Risk Score    Flowsheet Row ED to Hosp-Admission (Current) from 08/18/2022 in [facility] (1A)  30 Day Unplanned Readmission Risk Score (%) 22.99 Filed at 08/20/2022 0400       This score is the patient's risk of an unplanned readmission within 30 days of being discharged (0 -100%). The score is based on dignosis, age, lab data, medications, orders, and past utilization.   Low:  0-14.9   Medium: 15-21.9   High: 22-29.9   Extreme: 30 and above         Discharge Instructions:  Allergies as of 08/20/2022       Reactions   Ace Inhibitors    Other reaction(s): Cough   Fluoxetine Anxiety   "made me fall asleep" per pt "bad headaches and "makes  Me  Crazy" historical allergy noted in [employer] "made me fall asleep" per pt "bad headaches and "makes  Me  Crazy" Per New Patient Packet.    Metoclopramide    Other reaction(s): Other (See Comments), Other (See Comments), Unknown Tardive Dyskinesia  historical allergy noted in [employer] Tardive Dyskinesia  Per New Patient Packet.   Nalbuphine    Used Post Back surgery- Anesthesiologist Error. Patient had Narcotic Withdraw. Per New Patient Packet.    Other    Other reaction(s): Other (See Comments) Altered mental status in combo with narcotics at previous hospitalization - Full Withdrawal Symptoms Other reaction(s): Rash   Amoxicillin-pot Clavulanate Nausea Only   Per New Patient Packet.  Doxazosin Rash   Other reaction(s): Other - See Comments, Rash UNKNOWN REACTION UNKNOWN REACTION   Duloxetine Nausea Only   Per New Patient Packet.    Penicillins Nausea Only   Per New Patient Packet.   Tamsulosin Itching, Anxiety   Restless, Flushing, Heavy Chest, Itching, Hyperactive mood and Anxiety. Unable to handle side effects. Per New Patient Packet.     Trazodone And Nefazodone Itching, Anxiety, Rash   Headache. "INCREASED MY ANXIETY AND HEARTRATE" Flushing, tachycardia "INCREASED MY ANXIETY AND HEARTRATE" Per New Patient Packet.   Amlodipine    Shaking, unsure of reaction. Per New Patient Packet.    Cinoxacin    GI Intolerance, and Dizziness. Per New Patient Packet.    Ciprofloxacin    Other reaction(s): Unknown   Fludrocortisone Other (See Comments)   "Worsening headaches, GI issues, fatigue"   Nebivolol    Other reaction(s): Unknown   Olanzapine    Headache and unable to sleep for 3 nights. Per New Patient Packet.   Olmesartan    Other reaction(s): Unknown   Pregabalin    Confusion, Lack of concentration, dizziness, and likely drowsiness. Per New Patient Packet.    Prostaglandins    Other reaction(s): Other (See Comments) Intolerance   Thyroid Hormones    Other reaction(s): Other (See Comments) Thyroid (Nature Thyroid) contraindicated with some of your other medications.   Zolpidem    Nightmares, Ineffective after 2 days. Per New Patient Packet.    Duloxetine Hcl    Other reaction(s): Rash   Fluoxetine Hcl    Other reaction(s): Rash   Nucynta [tapentadol] Other (See Comments)   Vertigo    Phenytoin Anxiety   Hyperactivity, and Ineffective. Per New Patient Packet.         Medication List     STOP taking these medications    ondansetron 4 MG tablet Commonly known as: ZOFRAN       TAKE these medications    anastrozole 1 MG tablet Commonly known as: ARIMIDEX 1/2 tablet (0.5 mg) by mouth once weekly   aspirin 81 MG chewable tablet Chew 1 tablet (81 mg total) by mouth daily.   Atogepant 60 MG Tabs Take 60 mg by mouth once daily   benzonatate 100 MG capsule Commonly known as: TESSALON Take 1-2 tablets 3 times a day as needed for cough   butalbital-acetaminophen-caffeine 50-325-40 MG tablet Commonly known as: FIORICET TAKE 1 TABLET BY MOUTH EVERY 6 HOURS AS NEEDED FOR HEADACHE.   cetirizine 10 MG  tablet Commonly known as: ZYRTEC Take 10 mg by mouth daily as needed for allergies.   chlorthalidone 25 MG tablet Commonly known as: HYGROTON Take 1 tablet by mouth daily.   Cholecalciferol 50 MCG (2000 UT) Caps Take 1 tablet by mouth daily.   clopidogrel 75 MG tablet Commonly known as: PLAVIX Take 1 tablet (75 mg) by mouth once daily   DHEA 25 MG Caps Take 25 mg by mouth daily.   diazepam 5 MG tablet Commonly known as: VALIUM TAKE 1 TABLET IN THE MORNING AND 2 TABLETS AT BEDTIME   dicyclomine 10 MG capsule Commonly known as: BENTYL Take 1 capsule (10 mg total) by mouth 4 (four) times daily -  before meals and at bedtime.   DIGESTION GB PO Take 1 capsule by mouth daily.   escitalopram 20 MG tablet Commonly known as: LEXAPRO TAKE 1 TABLET BY MOUTH EVERY DAY   ezetimibe 10 MG tablet Commonly known as: ZETIA Take 1 tablet (10 mg total) by mouth  daily.   ferrous sulfate 325 (65 FE) MG tablet Take 325 mg by mouth. Once daily on Mondays, Wednesdays and Fridays   Gemtesa 75 MG Tabs Generic drug: Vibegron Take 75 mg by mouth daily.   HYDROcodone-acetaminophen 10-325 MG tablet Commonly known as: Norco Take 1 tablet by mouth every 8 (eight) hours as needed for severe pain. Must last 30 days. Start taking on: August 22, 2022   HYDROcodone-acetaminophen 10-325 MG tablet Commonly known as: Norco Take 1 tablet by mouth every 8 (eight) hours as needed for severe pain. Must last 30 days. Start taking on: September 21, 2022   HYDROcodone-acetaminophen 10-325 MG tablet Commonly known as: Norco Take 1 tablet by mouth every 8 (eight) hours as needed for severe pain. Must last 30 days. Start taking on: October 21, 2022   IMODIUM PO Take by mouth as needed.   ipratropium 0.06 % nasal spray Commonly known as: ATROVENT Place 1 spray into both nostrils 4 (four) times daily as needed for rhinitis. For up to 5-7 days then stop.   MAGNESIUM BISGLYCINATE PO Take by mouth daily.    melatonin 5 MG Tabs Take 5 mg by mouth at bedtime.   NONFORMULARY OR COMPOUNDED ITEM Bi-Mix Papaverine '30mg'$ , Phentolamine '1mg'$    Dosage: Inject 1cc per injection    Vial 6m   Qty #5 Refills 6   nortriptyline 25 MG capsule Commonly known as: Pamelor Take 1 capsule (25 mg total) by mouth at bedtime.   NUTRITIONAL SUPPLEMENT PO Take 1 capsule by mouth daily. [name]   polyethylene glycol 17 g packet Commonly known as: MIRALAX / GLYCOLAX Take 17 g by mouth daily as needed.   PROBIOTIC DAILY PO Take by mouth daily.   prochlorperazine 10 MG tablet Commonly known as: COMPAZINE Take 1 tablet (10 mg total) by mouth 2 (two) times daily as needed for nausea or vomiting.   propranolol ER 60 MG 24 hr capsule Commonly known as: Inderal LA Take 1 capsule (60 mg total) by mouth in the morning and at bedtime.   pyridostigmine 60 MG tablet Commonly known as: MESTINON Take 0.5 tablets (30 mg total) by mouth 2 (two) times daily.   RABEprazole 20 MG tablet Commonly known as: ACIPHEX TAKE 1 TABLET BY MOUTH TWICE A DAY   Repatha SureClick 1063MG/ML [employer] Generic drug: Evolocumab Inject 140 mg into the skin every 14 (fourteen) days.   sucralfate 1 g tablet Commonly known as: CARAFATE Take 1 g by mouth 4 (four) times daily as needed.   testosterone cypionate 200 MG/ML injection Commonly known as: DEPOTESTOSTERONE CYPIONATE Inject 80 mg into the muscle every 14 (fourteen) days.   valACYclovir 1000 MG tablet Commonly known as: VALTREX Take 2 tabs p.o. and repeat in 12 hours as needed for cold sore   Vitamin B-12 5000 MCG Lozg Take 1 lozenge by mouth daily.         Follow-up Information     BJearld Fenton NP Follow up in 1 week(s).   Specialties: Internal Medicine, Emergency Medicine Contact information: [phone]                Allergies  Allergen Reactions   Ace Inhibitors     Other  reaction(s): Cough   Fluoxetine Anxiety    "made me fall asleep" per pt "bad headaches and "makes  Me  Crazy" historical allergy noted in [employer] "made me fall asleep" per pt "bad headaches  and "makes  Me  Crazy" Per New Patient Packet.     Metoclopramide     Other reaction(s): Other (See Comments), Other (See Comments), Unknown Tardive Dyskinesia  historical allergy noted in [employer] Tardive Dyskinesia  Per New Patient Packet.   Nalbuphine     Used Post Back surgery- Anesthesiologist Error. Patient had Narcotic Withdraw. Per New Patient Packet.    Other     Other reaction(s): Other (See Comments) Altered mental status in combo with narcotics at previous hospitalization - Full Withdrawal Symptoms Other reaction(s): Rash   Amoxicillin-Pot Clavulanate Nausea Only    Per New Patient Packet.   Doxazosin Rash    Other reaction(s): Other - See Comments, Rash UNKNOWN REACTION UNKNOWN REACTION    Duloxetine Nausea Only    Per New Patient Packet.    Penicillins Nausea Only    Per New Patient Packet.   Tamsulosin Itching and Anxiety    Restless, Flushing, Heavy Chest, Itching, Hyperactive mood and Anxiety. Unable to handle side effects. Per New Patient Packet.     Trazodone And Nefazodone Itching, Anxiety and Rash    Headache. "INCREASED MY ANXIETY AND HEARTRATE" Flushing, tachycardia "INCREASED MY ANXIETY AND HEARTRATE" Per New Patient Packet.    Amlodipine     Shaking, unsure of reaction. Per New Patient Packet.     Cinoxacin     GI Intolerance, and Dizziness. Per New Patient Packet.    Ciprofloxacin     Other reaction(s): Unknown   Fludrocortisone Other (See Comments)    "Worsening headaches, GI issues, fatigue"   Nebivolol     Other reaction(s): Unknown   Olanzapine     Headache and unable to sleep for 3 nights. Per New Patient Packet.    Olmesartan     Other reaction(s): Unknown   Pregabalin     Confusion, Lack of concentration, dizziness, and likely drowsiness.  Per New Patient Packet.     Prostaglandins     Other reaction(s): Other (See Comments) Intolerance   Thyroid Hormones     Other reaction(s): Other (See Comments) Thyroid (Nature Thyroid) contraindicated with some of your other medications.   Zolpidem     Nightmares, Ineffective after 2 days. Per New Patient Packet.    Duloxetine Hcl     Other reaction(s): Rash   Fluoxetine Hcl     Other reaction(s): Rash   Nucynta [Tapentadol] Other (See Comments)    Vertigo    Phenytoin Anxiety    Hyperactivity, and Ineffective. Per New Patient Packet.      The results of significant diagnostics from this hospitalization (including imaging, microbiology, ancillary and laboratory) are listed below for reference.   Consultations:   Procedures/Studies: CT Chest W Contrast  Result Date: 08/18/2022 CLINICAL DATA:  Fever, shortness of breath, hypoxia, cough EXAM: CT CHEST WITH CONTRAST TECHNIQUE: Multidetector CT imaging of the chest was performed during intravenous contrast administration. RADIATION DOSE REDUCTION: This exam was performed according to the departmental dose-optimization program which includes automated exposure control, adjustment of the mA and/or kV according to patient size and/or use of iterative reconstruction technique. CONTRAST:  46m OMNIPAQUE IOHEXOL 300 MG/ML  SOLN COMPARISON:  Previous studies including the CT done on 0Aug 27, 2023FINDINGS: Cardiovascular: There is homogeneous enhancement in thoracic aorta. 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 There are no intraluminal filling defects in central pulmonary artery branches. Coronary artery calcifications are seen. Mediastinum/Nodes: No new significant lymphadenopathy is seen. Lungs/Pleura: Patchy infiltrates are seen in right upper lobe, right  middle lobe, right lower lobe and left upper lobe. Small patchy infiltrates are seen in left lower lobe. There is interval decrease in size of pleural-based infiltrate in  the posterior right lower lobe. There is interval increase in infiltrates in the rest of the lung fields. There are scattered nodular densities in both lungs with interval increase in number. There is minimal right pleural effusion. There is no pneumothorax. Upper Abdomen: There is decreased density in liver in comparison to the spleen. Small hiatal hernia is seen. Musculoskeletal: There is neurostimulator lead in thoracic spinal canal. No acute findings are seen in bony structures. IMPRESSION: Patchy infiltrates are seen in both lungs, more so in the right lung. There are multiple noncalcified nodules of varying sizes in both lungs with interval increase in number and size. There is interval decrease in size of mass like infiltrate in the posterior right lower lobe. Findings may suggest worsening multifocal pneumonia. Possibility of underlying neoplastic process is not excluded. Short-term follow-up CT in 3 months should be considered. There is minimal right pleural effusion. Coronary artery calcifications are seen. 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 Recommend annual imaging followup by CTA or MRA. This recommendation follows 2010 ACCF/AHA/AATS/ACR/ASA/SCA/SCAI/SIR/STS/SVM Guidelines for the Diagnosis and Management of Patients with Thoracic Aortic Disease. Circulation. 2010; 121: X106-Y694. Aortic aneurysm NOS (ICD10-I71.9) Small hiatal hernia. Other findings as described in the body of the report. Electronically Signed   By: Elmer Picker M.D.   On: 08/18/2022 14:29   DG Chest 2 View  Result Date: 08/18/2022 CLINICAL DATA:  Shortness of breath EXAM: CHEST - 2 VIEW COMPARISON:  04/15/2022 FINDINGS: The heart size and mediastinal contours are within normal limits. Extensive, heterogeneous and somewhat nodular airspace opacity of the right midlung and bilateral lung bases, which is increased compared to prior examination dated 04/15/2022. The visualized skeletal structures are  unremarkable. IMPRESSION: Extensive, heterogeneous and somewhat nodular airspace opacity of the right midlung and bilateral lung bases, which is increased compared to prior examination dated 04/15/2022. Findings are most consistent with worsened infection or aspiration. Electronically Signed   By: Delanna Ahmadi M.D.   On: 08/18/2022 12:27      Labs: BNP (last 3 results) Recent Labs    04/19/22 0952  BNP 854.6*   Basic Metabolic Panel: Recent Labs  Lab 08/18/22 1156 08/19/22 0549 08/20/22 0622  NA 136 138 139  K 3.1* 3.3* 3.4*  CL 101 101 105  CO2 '28 30 28  '$ GLUCOSE 115* 92 100*  BUN '17 12 14  '$ CREATININE 0.75 0.75 0.58*  CALCIUM 8.8* 8.8* 9.0  MG 2.0  --  2.0   Liver Function Tests: Recent Labs  Lab 08/18/22 1156  AST 21  ALT 19  ALKPHOS 92  BILITOT 0.8  PROT 6.6  ALBUMIN 2.9*   No results for input(s): "LIPASE", "AMYLASE" in the last 168 hours. No results for input(s): "AMMONIA" in the last 168 hours. CBC: Recent Labs  Lab 08/18/22 1156 08/19/22 0549 08/20/22 0622  WBC 11.2* 8.3 8.7  HGB 15.1 14.0 14.2  HCT 46.3 42.7 42.5  MCV 86.2 86.4 85.7  PLT 355 362 397   Cardiac Enzymes: No results for input(s): "CKTOTAL", "CKMB", "CKMBINDEX", "TROPONINI" in the last 168 hours. BNP: Invalid input(s): "POCBNP" CBG: No results for input(s): "GLUCAP" in the last 168 hours. D-Dimer No results for input(s): "DDIMER" in the last 72 hours. Hgb A1c No results for input(s): "HGBA1C" in the last 72 hours. Lipid Profile No results  for input(s): "CHOL", "HDL", "LDLCALC", "TRIG", "CHOLHDL", "LDLDIRECT" in the last 72 hours. Thyroid function studies No results for input(s): "TSH", "T4TOTAL", "T3FREE", "THYROIDAB" in the last 72 hours.  Invalid input(s): "FREET3" Anemia work up No results for input(s): "VITAMINB12", "FOLATE", "FERRITIN", "TIBC", "IRON", "RETICCTPCT" in the last 72 hours. Urinalysis    Component Value Date/Time   COLORURINE YELLOW (A) 08/18/2022 1620    APPEARANCEUR CLEAR (A) 08/18/2022 1620   APPEARANCEUR Clear 10/14/2020 1417   LABSPEC >1.046 (H) 08/18/2022 1620   PHURINE 6.0 08/18/2022 1620   GLUCOSEU NEGATIVE 08/18/2022 1620   HGBUR NEGATIVE 08/18/2022 1620   BILIRUBINUR NEGATIVE 08/18/2022 1620   BILIRUBINUR negative 03/13/2022 0952   BILIRUBINUR Negative 10/14/2020 1417   KETONESUR NEGATIVE 08/18/2022 1620   PROTEINUR NEGATIVE 08/18/2022 1620   UROBILINOGEN 0.2 03/13/2022 0952   NITRITE NEGATIVE 08/18/2022 1620   LEUKOCYTESUR NEGATIVE 08/18/2022 1620   Sepsis Labs Recent Labs  Lab 08/18/22 1156 08/19/22 0549 08/20/22 0622  WBC 11.2* 8.3 8.7   Microbiology Recent Results (from the past 240 hour(s))  Resp panel by RT-PCR (RSV, Flu A&B, Covid) Anterior Nasal Swab     Status: None   Collection Time: 08/13/22  3:49 PM   Specimen: Anterior Nasal Swab  Result Value Ref Range Status   SARS Coronavirus 2 by RT PCR NEGATIVE NEGATIVE Final    Comment: (NOTE) SARS-CoV-2 target nucleic acids are NOT DETECTED.  The SARS-CoV-2 RNA is generally detectable in upper respiratory specimens during the acute phase of infection. The lowest concentration of SARS-CoV-2 viral copies this assay can detect is 138 copies/mL. A negative result does not preclude SARS-Cov-2 infection and should not be used as the sole basis for treatment or other patient management decisions. A negative result may occur with  improper specimen collection/handling, submission of specimen other than nasopharyngeal swab, presence of viral mutation(s) within the areas targeted by this assay, and inadequate number of viral copies(<138 copies/mL). A negative result must be combined with clinical observations, patient history, and epidemiological information. The expected result is Negative.  Fact Sheet for Patients:  EntrepreneurPulse.com.au  Fact Sheet for Healthcare Providers:  IncredibleEmployment.be  This test is no t yet  approved or cleared by the [place] FDA and  has been authorized for detection and/or diagnosis of SARS-CoV-2 by FDA under an Emergency Use Authorization (EUA). This EUA will remain  in effect (meaning this test can be used) for the duration of the COVID-19 declaration under Section 564(b)(1) of the Act, 21 U.S.C.section 360bbb-3(b)(1), unless the authorization is terminated  or revoked sooner.       Influenza A by PCR NEGATIVE NEGATIVE Final   Influenza B by PCR NEGATIVE NEGATIVE Final    Comment: (NOTE) The Xpert Xpress SARS-CoV-2/FLU/RSV plus assay is intended as an aid in the diagnosis of influenza from Nasopharyngeal swab specimens and should not be used as a sole basis for treatment. Nasal washings and aspirates are unacceptable for Xpert Xpress SARS-CoV-2/FLU/RSV testing.  Fact Sheet for Patients: EntrepreneurPulse.com.au  Fact Sheet for Healthcare Providers: IncredibleEmployment.be  This test is not yet approved or cleared by the [place] FDA and has been authorized for detection and/or diagnosis of SARS-CoV-2 by FDA under an Emergency Use Authorization (EUA). This EUA will remain in effect (meaning this test can be used) for the duration of the COVID-19 declaration under Section 564(b)(1) of the Act, 21 U.S.C. section 360bbb-3(b)(1), unless the authorization is terminated or revoked.     Resp Syncytial Virus by PCR NEGATIVE NEGATIVE  Final    Comment: (NOTE) Fact Sheet for Patients: EntrepreneurPulse.com.au  Fact Sheet for Healthcare Providers: IncredibleEmployment.be  This test is not yet approved or cleared by the [place] FDA and has been authorized for detection and/or diagnosis of SARS-CoV-2 by FDA under an Emergency Use Authorization (EUA). This EUA will remain in effect (meaning this test can be used) for the duration of the COVID-19 declaration under Section 564(b)(1) of  the Act, 21 U.S.C. section 360bbb-3(b)(1), unless the authorization is terminated or revoked.  Performed at [facility], [place] [address], [place],  [address]   Resp panel by RT-PCR (RSV, Flu A&B, Covid) Anterior Nasal Swab     Status: None   Collection Time: 08/18/22 11:50 AM   Specimen: Anterior Nasal Swab  Result Value Ref Range Status   SARS Coronavirus 2 by RT PCR NEGATIVE NEGATIVE Final    Comment: (NOTE) SARS-CoV-2 target nucleic acids are NOT DETECTED.  The SARS-CoV-2 RNA is generally detectable in upper respiratory specimens during the acute phase of infection. The lowest concentration of SARS-CoV-2 viral copies this assay can detect is 138 copies/mL. A negative result does not preclude SARS-Cov-2 infection and should not be used as the sole basis for treatment or other patient management decisions. A negative result may occur with  improper specimen collection/handling, submission of specimen other than nasopharyngeal swab, presence of viral mutation(s) within the areas targeted by this assay, and inadequate number of viral copies(<138 copies/mL). A negative result must be combined with clinical observations, patient history, and epidemiological information. The expected result is Negative.  Fact Sheet for Patients:  EntrepreneurPulse.com.au  Fact Sheet for Healthcare Providers:  IncredibleEmployment.be  This test is no t yet approved or cleared by the [place] FDA and  has been authorized for detection and/or diagnosis of SARS-CoV-2 by FDA under an Emergency Use Authorization (EUA). This EUA will remain  in effect (meaning this test can be used) for the duration of the COVID-19 declaration under Section 564(b)(1) of the Act, 21 U.S.C.section 360bbb-3(b)(1), unless the authorization is terminated  or revoked sooner.       Influenza A by PCR NEGATIVE NEGATIVE Final   Influenza B by PCR NEGATIVE NEGATIVE Final     Comment: (NOTE) The Xpert Xpress SARS-CoV-2/FLU/RSV plus assay is intended as an aid in the diagnosis of influenza from Nasopharyngeal swab specimens and should not be used as a sole basis for treatment. Nasal washings and aspirates are unacceptable for Xpert Xpress SARS-CoV-2/FLU/RSV testing.  Fact Sheet for Patients: EntrepreneurPulse.com.au  Fact Sheet for Healthcare Providers: IncredibleEmployment.be  This test is not yet approved or cleared by the [place] FDA and has been authorized for detection and/or diagnosis of SARS-CoV-2 by FDA under an Emergency Use Authorization (EUA). This EUA will remain in effect (meaning this test can be used) for the duration of the COVID-19 declaration under Section 564(b)(1) of the Act, 21 U.S.C. section 360bbb-3(b)(1), unless the authorization is terminated or revoked.     Resp Syncytial Virus by PCR NEGATIVE NEGATIVE Final    Comment: (NOTE) Fact Sheet for Patients: EntrepreneurPulse.com.au  Fact Sheet for Healthcare Providers: IncredibleEmployment.be  This test is not yet approved or cleared by the [place] FDA and has been authorized for detection and/or diagnosis of SARS-CoV-2 by FDA under an Emergency Use Authorization (EUA). This EUA will remain in effect (meaning this test can be used) for the duration of the COVID-19 declaration under Section 564(b)(1) of the Act, 21 U.S.C. section 360bbb-3(b)(1), unless  the authorization is terminated or revoked.  Performed at [facility], [address] [place], [place] [address]   Culture, blood (routine x 2)     Status: None (Preliminary result)   Collection Time: 08/18/22 12:00 PM   Specimen: BLOOD  Result Value Ref Range Status   Specimen Description BLOOD LEFT FA  Final   Special Requests   Final    BOTTLES DRAWN AEROBIC AND ANAEROBIC Blood Culture adequate volume   Culture   Final    NO  GROWTH 2 DAYS Performed at [facility], [address] [place], [place] [address]    Report Status PENDING  Incomplete  Culture, blood (routine x 2)     Status: None (Preliminary result)   Collection Time: 08/18/22 12:00 PM   Specimen: BLOOD  Result Value Ref Range Status   Specimen Description BLOOD RIGHT HAND  Final   Special Requests   Final    BOTTLES DRAWN AEROBIC AND ANAEROBIC Blood Culture adequate volume   Culture   Final    NO GROWTH 2 DAYS Performed at [facility], [address], [place], [place] [address]    Report Status PENDING  Incomplete  Expectorated Sputum Assessment w Gram Stain, Rflx to Resp Cult     Status: None   Collection Time: 08/18/22 10:52 PM   Specimen: Sputum  Result Value Ref Range Status   Specimen Description SPUTUM  Final   Special Requests NONE  Final   Sputum evaluation   Final    THIS SPECIMEN IS ACCEPTABLE FOR SPUTUM CULTURE Performed at [facility], [address], [place], [place] [address]    Report Status 08/19/2022 FINAL  Final  MRSA Next Gen by PCR, Nasal     Status: None   Collection Time: 08/18/22 10:52 PM   Specimen: Nasal Mucosa; Nasal Swab  Result Value Ref Range Status   MRSA by PCR Next Gen NOT DETECTED NOT DETECTED Final    Comment: (NOTE) The GeneXpert MRSA Assay (FDA approved for NASAL specimens only), is one component of a comprehensive MRSA colonization surveillance program. It is not intended to diagnose MRSA infection nor to guide or monitor treatment for MRSA infections. Test performance is not FDA approved in patients less than 90 years old. Performed at [facility] [place] [facility], [address] [place], [place] [address]   Culture, Respiratory w Gram Stain     Status: None (Preliminary result)   Collection Time: 08/18/22 10:52 PM   Specimen: SPU  Result Value Ref Range Status   Specimen Description   Final    SPUTUM Performed at [facility], [address]  [place], [place] [address]    Special Requests   Final    NONE Reflexed from [phone] Performed at [facility], [address] [place], [place] [address]    Gram Stain   Final    MODERATE WBC PRESENT,BOTH PMN AND MONONUCLEAR FEW GRAM POSITIVE RODS Performed at [facility], [place] [address], [place], [place] [address]    Culture PENDING  Incomplete   Report Status PENDING  Incomplete  Respiratory (~20 pathogens) panel by PCR     Status: Abnormal   Collection Time: 08/19/22 11:29 AM   Specimen: Nasopharyngeal Swab; Respiratory  Result Value Ref Range Status   Adenovirus NOT DETECTED NOT DETECTED Final   Coronavirus 229E NOT DETECTED NOT DETECTED Final    Comment: (NOTE) The Coronavirus on the Respiratory Panel, DOES NOT test for the novel  Coronavirus (2019 nCoV)    Coronavirus HKU1 NOT DETECTED NOT DETECTED Final   Coronavirus NL63 NOT DETECTED NOT DETECTED Final   Coronavirus OC43 NOT DETECTED NOT DETECTED Final   Metapneumovirus NOT DETECTED NOT DETECTED Final   Rhinovirus / Enterovirus DETECTED (A) NOT DETECTED Final   Influenza A NOT DETECTED NOT DETECTED Final   Influenza B NOT DETECTED NOT DETECTED Final   Parainfluenza Virus 1 NOT DETECTED NOT DETECTED Final   Parainfluenza Virus 2 NOT DETECTED NOT DETECTED Final   Parainfluenza Virus 3 NOT DETECTED NOT DETECTED Final   Parainfluenza Virus 4 NOT DETECTED NOT DETECTED Final   Respiratory Syncytial Virus NOT DETECTED NOT DETECTED Final   Bordetella pertussis NOT DETECTED NOT DETECTED Final   Bordetella Parapertussis NOT DETECTED NOT DETECTED Final   Chlamydophila pneumoniae NOT DETECTED NOT DETECTED Final   Mycoplasma pneumoniae NOT DETECTED NOT DETECTED Final    Comment: Performed at [facility], [place] [address] [place], [place] [address]  Culture, Respiratory w Gram Stain     Status: None (Preliminary result)   Collection Time: 08/19/22 11:34 PM   Specimen: Expectorated Sputum; Respiratory  Result  Value Ref Range Status   Specimen Description   Final    EXPECTORATED SPUTUM Performed at [facility], [address], [place], [place] [address]    Special Requests   Final    NONE Performed at [facility], [address] [place], [place] [address]    Gram Stain   Final    MODERATE WBC PRESENT,BOTH PMN AND MONONUCLEAR FEW GRAM POSITIVE RODS FEW YEAST WITH PSEUDOHYPHAE FEW SQUAMOUS EPITHELIAL CELLS PRESENT Performed at [facility], [place] [address] [place],  [address]    Culture PENDING  Incomplete   Report Status PENDING  Incomplete     Total time spend on discharging this patient, including the last patient exam, discussing the hospital stay, instructions for ongoing care as it relates to all pertinent caregivers, as well as preparing the medical discharge records, prescriptions, and/or referrals as applicable, is 35 minutes.    Enzo Bi, MD  [facility] 08/20/2022, 8:01 AM

## 2022-08-21 ENCOUNTER — Telehealth: Payer: Self-pay

## 2022-08-21 ENCOUNTER — Ambulatory Visit: Payer: Medicare Other | Admitting: Nurse Practitioner

## 2022-08-21 LAB — MISC LABCORP TEST (SEND OUT): Labcorp test code: 832599

## 2022-08-21 LAB — CULTURE, RESPIRATORY W GRAM STAIN: Culture: NORMAL

## 2022-08-21 NOTE — Telephone Encounter (Signed)
Transition Care Management Follow-up Telephone Call Date of discharge and from where: ER 08/20/22 How have you been since you were released from the hospital? Still w/ pneumonia, resting, spoke w/ wife Any questions or concerns? No  Items Reviewed: Did the pt receive and understand the discharge instructions provided? Yes  Medications obtained and verified? Yes  Other? No  Any new allergies since your discharge? No  Dietary orders reviewed? No Do you have support at home? Yes     Functional Questionnaire: (I = Independent and D = Dependent) ADLs: I  Bathing/Dressing- I  Meal Prep- I  Eating- I  Maintaining continence- I  Transferring/Ambulation- I  Managing Meds- I  Follow up appointments reviewed:  PCP Hospital f/u appt confirmed? Yes  Scheduled to see Rollene Fare on 12/12 @ 2pm. Are transportation arrangements needed? No  If their condition worsens, is the pt aware to call PCP or go to the Emergency Dept.? Yes Was the patient provided with contact information for the PCP's office or ED? Yes Was to pt encouraged to call back with questions or concerns? Yes

## 2022-08-22 LAB — CULTURE, RESPIRATORY W GRAM STAIN

## 2022-08-22 LAB — MISC LABCORP TEST (SEND OUT): Labcorp test code: 164284

## 2022-08-23 LAB — CULTURE, BLOOD (ROUTINE X 2)
Culture: NO GROWTH
Culture: NO GROWTH
Special Requests: ADEQUATE
Special Requests: ADEQUATE

## 2022-08-24 NOTE — Progress Notes (Unsigned)
Cardiology Office Note    Date:  08/26/2022   ID:  Deryk, Bozman 06-Jun-1946, MRN [id number]  PCP:  [name], NP  Cardiologist:  Kate Sable, MD  Electrophysiologist:  None   Chief Complaint: Follow-up  History of Present Illness:   Grant Young is a 76 y.o. male with history of CAD with MI x 2 in [place] and with NSTEMI in 11/2021 status post PCI/DES to the LAD and LCx, HFpEF, HTN, HLD, headaches, GERD, medication intolerances who presents for follow-up of his CAD.  He was admitted to the hospital in 11/2021 with a high risk NSTEMI and underwent PCI to the LAD and LCx.  Mildly reduced LVEF with lateral hypokinesis was noted on LV gram.  Echo demonstrated an EF of 50 to 55%, select images concerning for inferior and lateral wall hypokinesis, indeterminate LV diastolic function parameters, normal RV systolic function and ventricular cavity size, normal PASP, mild mitral valve regurgitation, aorta documented as normal in size and structure, and an estimated right atrial pressure of 3 mmHg.  Following admission, he noted significant fatigue and wondered if this was related to his statin therapy.  He was subsequently evaluated by the lipid clinic and has been initiated on [place].  He was evaluated by pulmonology in the outpatient setting for a persistent cough in the fall 2023, being treated for right lower lobe pneumonia.  CT was performed in 05/2022 with multilobar consolidation with hypermetabolism.  PET scan in 05/2022 showed interval development of multilobar airspace consolidation with associated hypermetabolism.  Findings were suspicious for infectious etiology to document continued improvement and exclude underlying malignancy.    He was subsequently admitted to the hospital from 12/5 through 08/20/2022 with multifocal pneumonia due to rhinovirus.  CT during the admission showed some further improvement in right lower lobe opacity, though findings of nodular  infiltrates are also noted and not improved.  He was evaluated by the hospitalist service, pulmonology, and infectious disease.  He comes in today and is without symptoms of angina or decompensation.  His main complaint at this time is fatigue following his recent hospitalization for pneumonia.  He does feel like he is overall improving though is not back to his prior baseline.  He was evaluated by his PCP and pulmonology yesterday.  Blood pressures have been well-controlled in our office and at outside offices.  He reports his PCP transition him to long-acting propranolol, which has worked well for him.  He does wonder if he needs to continue chlorthalidone in this setting.  No significant lower extremity swelling, falls, or symptoms concerning for bleeding.  No presyncope or syncope.  Pulmonology planning for repeat CT in 3 months.  Tolerating ezetimibe and Repatha without off target effects.   Labs independently reviewed: 08/2022 - magnesium 2.0, Hgb 14.2, PLT 397, potassium 3.4, BUN 14, serum creatinine 0.58, albumin 2.9, AST/ALT normal 01/2022 - direct LDL 98 10/2021 - TC 170, TG 99, HDL 48, LDL 103 10/2019 - TSH normal, A1c 5.1  Past Medical History:  Diagnosis Date   Anxiety    Per New Patient Packet   Chronic back pain    Per New Patient Packet   Chronic heart disease    Per New Patient Packet   Depression    GERD (gastroesophageal reflux disease)    Headache    History of gastritis    Per New Patient Packet   History of neuropathy    Per New Patient Packet  Hypertension    Per New Patient Packet   Insomnia    Kidney stone    Lumbar radicular pain    Per New Patient Packet   Lyme disease    Per New Patient Packet   Myocardial infarction [facility])    Overactive bladder    PONV (postoperative nausea and vomiting)    Skin cancer, basal cell    Spinal cord stimulator status    01/08/21 - not currently using.   Squamous cell skin cancer    Substance abuse [facility])     Past  Surgical History:  Procedure Laterality Date   BACK SURGERY     CARDIAC CATHETERIZATION     CATARACT EXTRACTION W/PHACO Left 01/14/2021   Procedure: CATARACT EXTRACTION PHACO AND INTRAOCULAR LENS PLACEMENT (IOC) LEFT VIVITY TORIC LENS 8.75 00:56.7;  Surgeon: Birder Robson, MD;  Location: [facility];  Service: Ophthalmology;  Laterality: Left;   CATARACT EXTRACTION W/PHACO Right 01/28/2021   Procedure: CATARACT EXTRACTION PHACO AND INTRAOCULAR LENS PLACEMENT ([employer]) RIGHT VIVITY TORIC LENS;  Surgeon: Birder Robson, MD;  Location: [place];  Service: Ophthalmology;  Laterality: Right;  6.54 00:46.4   CHOLECYSTECTOMY  2017   COLONOSCOPY  09/15/2015   Per New Patient Packet   COLONOSCOPY WITH PROPOFOL N/A 10/03/2020   Procedure: COLONOSCOPY WITH PROPOFOL;  Surgeon: [name], MD;  Location: [facility] ENDOSCOPY;  Service: Gastroenterology;  Laterality: N/A;   CORONARY/GRAFT ACUTE MI REVASCULARIZATION N/A 11/27/2021   Procedure: Coronary/Graft Acute MI Revascularization;  Surgeon: Leonie Man, MD;  Location: [place] CV LAB;  Service: Cardiovascular;  Laterality: N/A;   ESOPHAGOGASTRODUODENOSCOPY (EGD) WITH PROPOFOL N/A 10/03/2020   Procedure: ESOPHAGOGASTRODUODENOSCOPY (EGD) WITH PROPOFOL;  Surgeon: [name], MD;  Location: [facility] ENDOSCOPY;  Service: Gastroenterology;  Laterality: N/A;   FOOT SURGERY Bilateral 03/18/2020   FOOT SURGERY  08/032021   GALLBLADDER SURGERY  09/15/2015   Gallbladder Removal. Procedure done by Dr.Beverly. Per New Patient Packet   KIDNEY STONE SURGERY  09/14/1980   Too many to count. Per New Patient Packet 09/14/1980-09/15/1995   KIDNEY STONE SURGERY  09/15/1995   Too many to count. Per New Patient Packet   LEFT HEART CATH AND CORONARY ANGIOGRAPHY N/A 11/27/2021   Procedure: LEFT HEART CATH AND CORONARY ANGIOGRAPHY;  Surgeon: Leonie Man, MD;  Location: [place] CV LAB;  Service: Cardiovascular;  Laterality: N/A;   LITHOTRIPSY   09/14/2014   Per New Patient Packet   LITHOTRIPSY  09/14/1996   No Stints Used. Per New Patient Packet   LITHOTRIPSY  09/14/1997   No Stints used. Per New Patient Packet   PAIN PUMP IMPLANTATION     PAIN PUMP REMOVAL     SHOULDER SURGERY Right 1984   SIGMOIDOSCOPY  09/14/2017   Per New Patient Packet   SPINAL CORD STIMULATOR IMPLANT     TONSILLECTOMY     UPPER GI ENDOSCOPY      Current Medications: Current Meds  Medication Sig   anastrozole (ARIMIDEX) 1 MG tablet 1/2 tablet (0.5 mg) by mouth once weekly   aspirin 81 MG chewable tablet Chew 1 tablet (81 mg total) by mouth daily.   Atogepant 60 MG TABS Take 60 mg by mouth once daily   butalbital-acetaminophen-caffeine (FIORICET) 50-325-40 MG tablet TAKE 1 TABLET BY MOUTH EVERY 6 HOURS AS NEEDED FOR HEADACHE.   cetirizine (ZYRTEC) 10 MG tablet Take 10 mg by mouth daily as needed for allergies.   chlorthalidone (HYGROTON) 25 MG tablet Take 1 tablet by mouth daily.  Cholecalciferol 50 MCG (2000 UT) CAPS Take 1 tablet by mouth daily.   clopidogrel (PLAVIX) 75 MG tablet Take 1 tablet (75 mg) by mouth once daily   Cyanocobalamin (VITAMIN B-12) 5000 MCG LOZG Take 1 lozenge by mouth daily.    DHEA 25 MG CAPS Take 25 mg by mouth daily.   diazepam (VALIUM) 5 MG tablet TAKE 1 TABLET IN THE MORNING AND 2 TABLETS AT BEDTIME   dicyclomine (BENTYL) 10 MG capsule Take 1 capsule (10 mg total) by mouth 4 (four) times daily -  before meals and at bedtime.   Digestive Aids Mixture (DIGESTION GB PO) Take 1 capsule by mouth daily.   doxycycline (VIBRAMYCIN) 100 MG capsule Take 100 mg by mouth 2 (two) times daily.   escitalopram (LEXAPRO) 20 MG tablet TAKE 1 TABLET BY MOUTH EVERY DAY   Evolocumab (REPATHA SURECLICK) 583 MG/ML SOAJ Inject 140 mg into the skin every 14 (fourteen) days.   ezetimibe (ZETIA) 10 MG tablet Take 1 tablet (10 mg total) by mouth daily.   ferrous sulfate 325 (65 FE) MG tablet Take 325 mg by mouth. Once daily on Mondays, Wednesdays  and Fridays   HYDROcodone-acetaminophen (NORCO) 10-325 MG tablet Take 1 tablet by mouth every 8 (eight) hours as needed for severe pain. Must last 30 days.   ipratropium (ATROVENT) 0.06 % nasal spray Place 1 spray into both nostrils 4 (four) times daily as needed for rhinitis. For up to 5-7 days then stop.   Loperamide HCl (IMODIUM PO) Take by mouth as needed.    MAGNESIUM BISGLYCINATE PO Take by mouth daily.   melatonin 5 MG TABS Take 5 mg by mouth at bedtime.   NONFORMULARY OR COMPOUNDED ITEM Bi-Mix Papaverine '30mg'$ , Phentolamine '1mg'$    Dosage: Inject 1cc per injection    Vial 79m   Qty #5 Refills 6   nortriptyline (PAMELOR) 25 MG capsule Take 1 capsule (25 mg total) by mouth at bedtime.   Nutritional Supplements (NUTRITIONAL SUPPLEMENT PO) Take 1 capsule by mouth daily. [name]   polyethylene glycol (MIRALAX / GLYCOLAX) 17 g packet Take 17 g by mouth daily as needed.   POTASSIUM PO Take by mouth. Takes 5x weekly.   Probiotic Product (PROBIOTIC DAILY PO) Take by mouth daily.   prochlorperazine (COMPAZINE) 10 MG tablet Take 1 tablet (10 mg total) by mouth 2 (two) times daily as needed for nausea or vomiting.   propranolol ER (INDERAL LA) 60 MG 24 hr capsule Take 1 capsule (60 mg total) by mouth in the morning and at bedtime.   pyridostigmine (MESTINON) 60 MG tablet Take 0.5 tablets (30 mg total) by mouth 2 (two) times daily.   RABEprazole (ACIPHEX) 20 MG tablet TAKE 1 TABLET BY MOUTH TWICE A DAY   sucralfate (CARAFATE) 1 g tablet Take 1 g by mouth 4 (four) times daily as needed.    testosterone cypionate (DEPOTESTOSTERONE CYPIONATE) 200 MG/ML injection Inject 80 mg into the muscle every 14 (fourteen) days.   valACYclovir (VALTREX) 1000 MG tablet Take 2 tabs p.o. and repeat in 12 hours as needed for cold sore    Allergies:   Ace inhibitors, Fluoxetine, Metoclopramide, Nalbuphine, Other, Amoxicillin-pot clavulanate, Doxazosin, Duloxetine, Penicillins, Tamsulosin, Trazodone  and nefazodone, Amlodipine, Cinoxacin, Ciprofloxacin, Fludrocortisone, Nebivolol, Olanzapine, Olmesartan, Pregabalin, Prostaglandins, Thyroid hormones, Zolpidem, Duloxetine hcl, Fluoxetine hcl, Nucynta [tapentadol], and Phenytoin   Social History   Socioeconomic History   Marital status: Married    Spouse name: Not on file   Number of children:  Not on file   Years of education: Not on file   Highest education level: Not on file  Occupational History   Not on file  Tobacco Use   Smoking status: Never    Passive exposure: Never   Smokeless tobacco: Never  Vaping Use   Vaping Use: Never used  Substance and Sexual Activity   Alcohol use: Yes    Comment: 1 Drink a Month, socially   Drug use: Not Currently   Sexual activity: Yes    Birth control/protection: None  Other Topics Concern   Not on file  Social History Narrative   Tobacco use, amount per day now: None   Past tobacco use, amount per day: None   How many years did you use tobacco: 0   Alcohol use (drinks per week): 0-1 Month   Diet:   Do you drink/eat things with caffeine: Occasionally ( Hot Chocolate and maybe 1/4 of 16oz Pepsi 2-3 times a week.   Marital status: Married                                  What year were you married? 1970   Do you live in a house, apartment, assisted living, condo, trailer, etc.? House   Is it one or more stories? One   How many persons live in your home? 2   Do you have pets in your home?( please list)  No   Highest Level of education completed: Masters   Current or past profession: Intensive Transport planner for [employer]   Do you exercise? Yes                                    Type and how often? [place], Recumbent Bike, 3 times a week. Try to get a 1.2 mile walk at least 3 times a week or more.     Do you have a living will? Yes   Do you have a DNR form?  No                                 If not, do you want to discuss one?   Do you have signed POA/HPOA forms? Yes                        If so, please bring to you appointment    Do you have any difficulty bathing or dressing yourself? No    Do you have difficulty preparing food or eating? No   Do you have difficulty managing your medications? No   Do you have any difficulty managing your finances? No   Do you have any difficulty affording your medications? No         Social Determinants of Radio broadcast assistant Strain: Not on file  Food Insecurity: Not on file  Transportation Needs: Not on file  Physical Activity: Not on file  Stress: Not on file  Social Connections: Not on file     Family History:  The patient's family history includes Anxiety disorder in his daughter; Dementia in his mother; Heart disease in his father; Heart failure in his father; Hypertension in his father; Kidney Stones in his daughter; OCD in his daughter; Stroke in his  father and mother.  ROS:   12-point review of systems is negative unless otherwise noted in the HPI.   EKGs/Labs/Other Studies Reviewed:    Studies reviewed were summarized above. The additional studies were reviewed today:  2D echo 11/28/2021: 1. Left ventricular ejection fraction, by estimation, is 50 to 55%. The  left ventricle has low normal function. The left ventricle demonstrates  regional wall motion abnormalities (select images with concern for  inferior and lateral wall hypokinesis).  Left ventricular diastolic parameters are indeterminate.   2. Right ventricular systolic function is normal. The right ventricular  size is normal. There is normal pulmonary artery systolic pressure. The  estimated right ventricular systolic pressure is 63.0 mmHg.   3. The mitral valve is normal in structure. Mild mitral valve  regurgitation. No evidence of mitral stenosis.   4. The aortic valve is normal in structure. Aortic valve regurgitation is  mild. No aortic stenosis is present.   5. The inferior vena cava is normal in size with greater than 50%   respiratory variability, suggesting right atrial pressure of 3 mmHg. __________   LHC 11/27/2021:   CULPRIT LESION: Prox Cx to Mid Cx lesion is 100% stenosed with 70% stenosed side branch in 2nd Mrg.   A drug-eluting stent was successfully placed using a STENT ONYX FRONTIER 2.5X26.  Deployed to 2.7 mm   Post intervention, there is a 0% residual stenosis. Post intervention, the side branch was reduced to 60% residual stenosis.   -------------------------------------------------   LESION SEGMENT #2: Ost LAD to Prox LAD lesion is 75% stenosed.  Prox LAD to Mid LAD lesion is 80% stenosed. Mid LAD lesion is 99% stenosed. (total length ~32 mm)   Prox LAD to Mid LAD lesion is 80% stenosed.   A drug-eluting stent was successfully placed covering all 3 lesions, using a STENT ONYX FRONTIER 2.5X34.  Postdilated to 2.8 mm   Post intervention, there is a 0% residual stenosis.   -------------------------------------------------   Moderately calcified very large dominant RCA with mild disease throughout.   -------------------------------------------------   Post intervention, there is a 0% residual stenosis.   There is mild left ventricular systolic dysfunction. The left ventricular ejection fraction is 45-50% by visual estimate. -Lateral wall hypokinesis   LV end diastolic pressure is severely elevated.   There is no aortic valve stenosis.   SUMMARY Acute Coronary Syndrome / NSTEMI (with STEMI physiology) Severe two-vessel CAD :  Culprit lesion 100% occluded proximal and mid LCx  Sequential ostial proximal LAD 75, 80 to 99% stenosis Successful revascularization of occluded LCx with Onyx Frontier 2.5 mm x 26 mm deployed to 2.7 mm.  TIMI 0 flow improved to TIMI-3.  Also TIMI-3 flow noted in major/small caliber OM branch with an ostial 60 to 70% stenosis (not PCI during Successful ostial to mid LAD PCI (3 tandem lesions ~total length 32 mmm) covering the total lesion segment with an Onyx Frontier 2.5 mm x  34 mm-deployed/postdilated to 2.8 mm Heavily calcified but widely patent large dominant RCA that did provide collaterals to the occluded LCx Mildly reduced EF with lateral hypokinesis and significant elevated LVEDP of 28 mmHg     RECOMMENDATIONS ICU admission for post PCI care. Very complex medication list, will need to figure out the best way to treat lipids.  He is on propranolol which was the only tolerated beta-blocker.  Marland Kitchen DAPT per recommendations section Check 2D echo Pending echo results, and symptoms, could potentially consider discharge in 2 to 3 days.  __________   2D echo 12/11/2019: 1. Left ventricular ejection fraction, by estimation, is 60 to 65%. The  left ventricle has normal function. The left ventricle has no regional  wall motion abnormalities. Left ventricular diastolic parameters are  consistent with Grade I diastolic  dysfunction (impaired relaxation).   2. Right ventricular systolic function is normal. The right ventricular  size is normal. There is normal pulmonary artery systolic pressure.   3. The mitral valve is normal in structure. No evidence of mitral valve  regurgitation. No evidence of mitral stenosis.   4. The aortic valve is grossly normal. Aortic valve regurgitation is mild  to moderate. No aortic stenosis is present.   5. Pulmonic valve regurgitation not assessed.   6. Aortic dilatation noted. 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   7. The inferior vena cava is normal in size with greater than 50%  respiratory variability, suggesting right atrial pressure of 3 mmHg.   EKG:  EKG is ordered today.  The EKG ordered today demonstrates NSR, 64 bpm, baseline artifact, nonspecific lateral ST-T changes  Recent Labs: 04/19/2022: B Natriuretic Peptide 149.5 08/18/2022: ALT 19 08/20/2022: BUN 14; Creatinine, Ser 0.58; Hemoglobin 14.2; Magnesium 2.0; Platelets 397; Potassium 3.4; Sodium 139  Recent Lipid Panel    Component Value  Date/Time   CHOL 170 11/11/2021 1006   TRIG 99 11/11/2021 1006   HDL 48 11/11/2021 1006   CHOLHDL 3.5 11/11/2021 1006   LDLCALC 103 (H) 11/11/2021 1006   LDLDIRECT 98.5 01/30/2022 1535    PHYSICAL EXAM:    VS:  BP 110/78 (BP Location: Left Arm, Patient Position: Sitting, Cuff Size: Normal)   Pulse 68   Ht '5\' 9"'$  (1.753 m)   Wt 138 lb 8 oz (62.8 kg)   SpO2 93%   BMI 20.45 kg/m   BMI: Body mass index is 20.45 kg/m.  Physical Exam Vitals reviewed.  Constitutional:      Appearance: He is well-developed.  HENT:     Head: Normocephalic and atraumatic.  Eyes:     General:        Right eye: No discharge.        Left eye: No discharge.  Neck:     Vascular: No JVD.  Cardiovascular:     Rate and Rhythm: Normal rate and regular rhythm.     Pulses:          Posterior tibial pulses are 2+ on the right side and 2+ on the left side.     Heart sounds: Normal heart sounds, S1 normal and S2 normal. Heart sounds not distant. No midsystolic click and no opening snap. No murmur heard.    No friction rub.  Pulmonary:     Effort: Pulmonary effort is normal. No respiratory distress.     Breath sounds: Normal breath sounds. No decreased breath sounds, wheezing or rales.  Chest:     Chest wall: No tenderness.  Abdominal:     General: There is no distension.  Musculoskeletal:     Cervical back: Normal range of motion.     Right lower leg: No edema.     Left lower leg: No edema.  Skin:    General: Skin is warm and dry.     Nails: There is no clubbing.  Neurological:     Mental Status: He is alert and oriented to person, place, and time.  Psychiatric:        Speech: Speech normal.  Behavior: Behavior normal.        Thought Content: Thought content normal.        Judgment: Judgment normal.     Wt Readings from Last 3 Encounters:  08/26/22 138 lb 8 oz (62.8 kg)  08/25/22 136 lb (61.7 kg)  08/18/22 139 lb 15.9 oz (63.5 kg)     ASSESSMENT & PLAN:   CAD involving the native  coronary arteries without angina: He is doing well and is without symptoms concerning for angina or decompensation.  Continue aggressive risk factor modification and secondary prevention with continuation of DAPT (ASA and clopidogrel) for a minimum of 12 months dating back to date of PCI (12/10/2021), though ideally long-term secondary to left main equivalent stenting of ostial/proximal LAD and proximal LCx with long stents.  Antilipid therapy as outlined below with ezetimibe and Repatha.  He remains on propranolol which is the only beta-blocker he does not have off target effects.  No indication for further ischemic testing at this time.  HFpEF: He appears euvolemic and well compensated.  Not requiring standing loop diuretic.  Continue optimal blood pressure control.  Given multiple medication intolerances, and in the context of prior urinary frequency, we will defer challenge of MRA or SGLT2 inhibitor at this time.  HTN: Blood pressure is well-controlled in the office today.  We will undergo a trial of holding chlorthalidone with continuation of propranolol which patient reports was recently transitioned to long-acting by his PCPs office.  He will update [name] in 2 weeks time with how his blood pressures have been running.  HLD: LDL 103 in 10/2021.  Intolerant to statins.  Currently on ezetimibe and Repatha.  Future orders for fasting lipid panel and LFT to be obtained in approximately 4 weeks.  Abnormal chest CT/recent pneumonia: Follow-up with pulmonology as directed.    Disposition: F/u with Dr. Garen Lah or an APP in 2 months.   Medication Adjustments/Labs and Tests Ordered: Current medicines are reviewed at length with the patient today.  Concerns regarding medicines are outlined above. Medication changes, Labs and Tests ordered today are summarized above and listed in the Patient Instructions accessible in Encounters.   Signed, Christell Faith, PA-C 08/26/2022 3:36 PM     [facility] [address] [place] [address] [place], [place] [address] [phone]

## 2022-08-25 ENCOUNTER — Encounter: Payer: Self-pay | Admitting: Internal Medicine

## 2022-08-25 ENCOUNTER — Ambulatory Visit (INDEPENDENT_AMBULATORY_CARE_PROVIDER_SITE_OTHER): Payer: Medicare Other | Admitting: Internal Medicine

## 2022-08-25 VITALS — BP 116/68 | HR 61 | Temp 96.8°F | Wt 136.0 lb

## 2022-08-25 DIAGNOSIS — J189 Pneumonia, unspecified organism: Secondary | ICD-10-CM

## 2022-08-25 NOTE — Patient Instructions (Signed)

## 2022-08-25 NOTE — Progress Notes (Signed)
Subjective:    Patient ID: Grant Young, male    DOB: 25-Sep-1945, 76 y.o.   MRN: [id number]  HPI  Patient presents to clinic today for TCM hospital follow-up.  He presented to the ER 12/5 with complaint of cough and dyspnea.  He has been following with pulmonology prior to hospital admission.  CT chest showed:  IMPRESSION: Patchy infiltrates are seen in both lungs, more so in the right lung. There are multiple noncalcified nodules of varying sizes in both lungs with interval increase in number and size. There is interval decrease in size of mass like infiltrate in the posterior right lower lobe. Findings may suggest worsening multifocal pneumonia. Possibility of underlying neoplastic process is not excluded. Short-term follow-up CT in 3 months should be considered. There is minimal right pleural effusion. Coronary artery calcifications are seen. 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 Small hiatal hernia. Other findings as described in the body of the report.  Pulmonology and infectious disease were consulted.  He did test positive for rhinovirus.  ID recommended holding antibiotics at this time.  They recommended supportive care.  He was discharged on 12/7, advised to follow-up with his PCP and pulmonology.  Since that time, he reports he saw pulmonology earlier today for the same. He was started on Doxycyline.  Review of Systems  Past Medical History:  Diagnosis Date   Anxiety    Per New Patient Packet   Chronic back pain    Per New Patient Packet   Chronic heart disease    Per New Patient Packet   Depression    GERD (gastroesophageal reflux disease)    Headache    History of gastritis    Per New Patient Packet   History of neuropathy    Per New Patient Packet   Hypertension    Per New Patient Packet   Insomnia    Kidney stone    Lumbar radicular pain    Per New Patient Packet   Lyme disease    Per New Patient Packet   Myocardial infarction [facility])     Overactive bladder    PONV (postoperative nausea and vomiting)    Skin cancer, basal cell    Spinal cord stimulator status    01/08/21 - not currently using.   Squamous cell skin cancer    Substance abuse (HCC)     Current Outpatient Medications  Medication Sig Dispense Refill   anastrozole (ARIMIDEX) 1 MG tablet 1/2 tablet (0.5 mg) by mouth once weekly     aspirin 81 MG chewable tablet Chew 1 tablet (81 mg total) by mouth daily. 90 tablet 3   Atogepant 60 MG TABS Take 60 mg by mouth once daily     benzonatate (TESSALON) 100 MG capsule Take 1-2 tablets 3 times a day as needed for cough 30 capsule 0   butalbital-acetaminophen-caffeine (FIORICET) 50-325-40 MG tablet TAKE 1 TABLET BY MOUTH EVERY 6 HOURS AS NEEDED FOR HEADACHE. 90 tablet 0   cetirizine (ZYRTEC) 10 MG tablet Take 10 mg by mouth daily as needed for allergies.     chlorthalidone (HYGROTON) 25 MG tablet Take 1 tablet by mouth daily.     Cholecalciferol 50 MCG (2000 UT) CAPS Take 1 tablet by mouth daily.     clopidogrel (PLAVIX) 75 MG tablet Take 1 tablet (75 mg) by mouth once daily 90 tablet 2   Cyanocobalamin (VITAMIN B-12) 5000 MCG LOZG Take 1 lozenge by mouth daily.  DHEA 25 MG CAPS Take 25 mg by mouth daily.     diazepam (VALIUM) 5 MG tablet TAKE 1 TABLET IN THE MORNING AND 2 TABLETS AT BEDTIME 90 tablet 0   dicyclomine (BENTYL) 10 MG capsule Take 1 capsule (10 mg total) by mouth 4 (four) times daily -  before meals and at bedtime. 90 capsule 1   Digestive Aids Mixture (DIGESTION GB PO) Take 1 capsule by mouth daily.     escitalopram (LEXAPRO) 20 MG tablet TAKE 1 TABLET BY MOUTH EVERY DAY 90 tablet 0   Evolocumab (REPATHA SURECLICK) 161 MG/ML SOAJ Inject 140 mg into the skin every 14 (fourteen) days. 2 mL 11   ezetimibe (ZETIA) 10 MG tablet Take 1 tablet (10 mg total) by mouth daily. 90 tablet 1   ferrous sulfate 325 (65 FE) MG tablet Take 325 mg by mouth. Once daily on Mondays, Wednesdays and Fridays      HYDROcodone-acetaminophen (NORCO) 10-325 MG tablet Take 1 tablet by mouth every 8 (eight) hours as needed for severe pain. Must last 30 days. 90 tablet 0   [START ON 09/21/2022] HYDROcodone-acetaminophen (NORCO) 10-325 MG tablet Take 1 tablet by mouth every 8 (eight) hours as needed for severe pain. Must last 30 days. 90 tablet 0   [START ON 10/21/2022] HYDROcodone-acetaminophen (NORCO) 10-325 MG tablet Take 1 tablet by mouth every 8 (eight) hours as needed for severe pain. Must last 30 days. 90 tablet 0   ipratropium (ATROVENT) 0.06 % nasal spray Place 1 spray into both nostrils 4 (four) times daily as needed for rhinitis. For up to 5-7 days then stop. 15 mL 0   Loperamide HCl (IMODIUM PO) Take by mouth as needed.      MAGNESIUM BISGLYCINATE PO Take by mouth daily.     melatonin 5 MG TABS Take 5 mg by mouth at bedtime.     NONFORMULARY OR COMPOUNDED ITEM Bi-Mix Papaverine '30mg'$ , Phentolamine '1mg'$    Dosage: Inject 1cc per injection    Vial 52m   Qty #5 Refills 6 5 each 6   nortriptyline (PAMELOR) 25 MG capsule Take 1 capsule (25 mg total) by mouth at bedtime. 90 capsule 0   Nutritional Supplements (NUTRITIONAL SUPPLEMENT PO) Take 1 capsule by mouth daily. [name]     polyethylene glycol (MIRALAX / GLYCOLAX) 17 g packet Take 17 g by mouth daily as needed.     Probiotic Product (PROBIOTIC DAILY PO) Take by mouth daily.     prochlorperazine (COMPAZINE) 10 MG tablet Take 1 tablet (10 mg total) by mouth 2 (two) times daily as needed for nausea or vomiting. 180 tablet 0   propranolol ER (INDERAL LA) 60 MG 24 hr capsule Take 1 capsule (60 mg total) by mouth in the morning and at bedtime. 180 capsule 0   pyridostigmine (MESTINON) 60 MG tablet Take 0.5 tablets (30 mg total) by mouth 2 (two) times daily. 90 tablet 0   RABEprazole (ACIPHEX) 20 MG tablet TAKE 1 TABLET BY MOUTH TWICE A DAY 180 tablet 3   sucralfate (CARAFATE) 1 g tablet Take 1 g by mouth 4 (four) times daily as needed.       testosterone cypionate (DEPOTESTOSTERONE CYPIONATE) 200 MG/ML injection Inject 80 mg into the muscle every 14 (fourteen) days.     valACYclovir (VALTREX) 1000 MG tablet Take 2 tabs p.o. and repeat in 12 hours as needed for cold sore 30 tablet 0   Vibegron (GEMTESA) 75 MG TABS Take 75 mg by mouth  daily. 90 tablet 3   No current facility-administered medications for this visit.    Allergies  Allergen Reactions   Ace Inhibitors     Other reaction(s): Cough   Fluoxetine Anxiety    "made me fall asleep" per pt "bad headaches and "makes  Me  Crazy" historical allergy noted in [employer] "made me fall asleep" per pt "bad headaches and "makes  Me  Crazy" Per New Patient Packet.     Metoclopramide     Other reaction(s): Other (See Comments), Other (See Comments), Unknown Tardive Dyskinesia  historical allergy noted in [employer] Tardive Dyskinesia  Per New Patient Packet.   Nalbuphine     Used Post Back surgery- Anesthesiologist Error. Patient had Narcotic Withdraw. Per New Patient Packet.    Other     Other reaction(s): Other (See Comments) Altered mental status in combo with narcotics at previous hospitalization - Full Withdrawal Symptoms Other reaction(s): Rash   Amoxicillin-Pot Clavulanate Nausea Only    Per New Patient Packet.   Doxazosin Rash    Other reaction(s): Other - See Comments, Rash UNKNOWN REACTION UNKNOWN REACTION    Duloxetine Nausea Only    Per New Patient Packet.    Penicillins Nausea Only    Per New Patient Packet.   Tamsulosin Itching and Anxiety    Restless, Flushing, Heavy Chest, Itching, Hyperactive mood and Anxiety. Unable to handle side effects. Per New Patient Packet.     Trazodone And Nefazodone Itching, Anxiety and Rash    Headache. "INCREASED MY ANXIETY AND HEARTRATE" Flushing, tachycardia "INCREASED MY ANXIETY AND HEARTRATE" Per New Patient Packet.    Amlodipine     Shaking, unsure of reaction. Per New Patient Packet.     Cinoxacin     GI  Intolerance, and Dizziness. Per New Patient Packet.    Ciprofloxacin     Other reaction(s): Unknown   Fludrocortisone Other (See Comments)    "Worsening headaches, GI issues, fatigue"   Nebivolol     Other reaction(s): Unknown   Olanzapine     Headache and unable to sleep for 3 nights. Per New Patient Packet.    Olmesartan     Other reaction(s): Unknown   Pregabalin     Confusion, Lack of concentration, dizziness, and likely drowsiness. Per New Patient Packet.     Prostaglandins     Other reaction(s): Other (See Comments) Intolerance   Thyroid Hormones     Other reaction(s): Other (See Comments) Thyroid (Nature Thyroid) contraindicated with some of your other medications.   Zolpidem     Nightmares, Ineffective after 2 days. Per New Patient Packet.    Duloxetine Hcl     Other reaction(s): Rash   Fluoxetine Hcl     Other reaction(s): Rash   Nucynta [Tapentadol] Other (See Comments)    Vertigo    Phenytoin Anxiety    Hyperactivity, and Ineffective. Per New Patient Packet.     Family History  Problem Relation Age of Onset   Heart disease Father    Heart failure Father    Hypertension Father    Stroke Father    Stroke Mother    Dementia Mother    Kidney Stones Daughter    Anxiety disorder Daughter    OCD Daughter     Social History   Socioeconomic History   Marital status: Married    Spouse name: Not on file   Number of children: Not on file   Years of education: Not on file   Highest education level: Not  on file  Occupational History   Not on file  Tobacco Use   Smoking status: Never    Passive exposure: Never   Smokeless tobacco: Never  Vaping Use   Vaping Use: Never used  Substance and Sexual Activity   Alcohol use: Yes    Comment: 1 Drink a Month, socially   Drug use: Not Currently   Sexual activity: Yes    Birth control/protection: None  Other Topics Concern   Not on file  Social History Narrative   Tobacco use, amount per day now: None   Past  tobacco use, amount per day: None   How many years did you use tobacco: 0   Alcohol use (drinks per week): 0-1 Month   Diet:   Do you drink/eat things with caffeine: Occasionally ( Hot Chocolate and maybe 1/4 of 16oz Pepsi 2-3 times a week.   Marital status: Married                                  What year were you married? 1970   Do you live in a house, apartment, assisted living, condo, trailer, etc.? House   Is it one or more stories? One   How many persons live in your home? 2   Do you have pets in your home?( please list)  No   Highest Level of education completed: Masters   Current or past profession: Intensive Transport planner for [employer]   Do you exercise? Yes                                    Type and how often? [place], Recumbent Bike, 3 times a week. Try to get a 1.2 mile walk at least 3 times a week or more.     Do you have a living will? Yes   Do you have a DNR form?  No                                 If not, do you want to discuss one?   Do you have signed POA/HPOA forms? Yes                       If so, please bring to you appointment    Do you have any difficulty bathing or dressing yourself? No    Do you have difficulty preparing food or eating? No   Do you have difficulty managing your medications? No   Do you have any difficulty managing your finances? No   Do you have any difficulty affording your medications? No         Social Determinants of Radio broadcast assistant Strain: Not on file  Food Insecurity: Not on file  Transportation Needs: Not on file  Physical Activity: Not on file  Stress: Not on file  Social Connections: Not on file  Intimate Partner Violence: Not on file     Constitutional: Patient reports frequent headaches, fatigue.  Denies fever, malaise, or abrupt weight changes.  HEENT: Denies eye pain, eye redness, ear pain, ringing in the ears, wax buildup, runny nose, nasal congestion, bloody nose, or sore  throat. Respiratory: Pt reports cough. Denies difficulty breathing, shortness of breath, or sputum production.  Cardiovascular: Denies chest pain, chest tightness, palpitations or swelling in the hands or feet.  Gastrointestinal: Pt reports poor appetite. Denies abdominal pain, bloating, constipation, diarrhea or blood in the stool.  Skin: Denies redness, rashes, lesions or ulcercations.  Neurological: Patient reports restless legs.  Denies dizziness, difficulty with memory, difficulty with speech or problems with balance and coordination.    No other specific complaints in a complete review of systems (except as listed in HPI above).     Objective:   Physical Exam  BP 116/68 (BP Location: Left Arm, Patient Position: Sitting, Cuff Size: Normal)   Pulse 61   Temp (!) 96.8 F (36 C) (Temporal)   Wt 136 lb (61.7 kg)   SpO2 96%   BMI 20.08 kg/m   Wt Readings from Last 3 Encounters:  08/18/22 139 lb 15.9 oz (63.5 kg)  07/30/22 140 lb (63.5 kg)  07/03/22 140 lb 14.4 oz (63.9 kg)    General: Appears his stated age, chronically ill appearing, in NAD. Skin: Bruising noted to right hand. HEENT: Head: normal shape and size; Eyes: sclera white, no icterus, conjunctiva pink, PERRLA and EOMs intact;  Neck:  No adenopathy noted. Cardiovascular: Normal rate and rhythm. S1,S2 noted.  No murmur, rubs or gallops noted. No JVD or BLE edema.  Pulmonary/Chest: Normal effort and positive vesicular breath sounds. No respiratory distress. No wheezes, rales or ronchi noted.  Musculoskeletal: Gait slow and steady without device. Neurological: Alert and oriented.   BMET    Component Value Date/Time   NA 139 08/20/2022 0622   NA 146 (H) 12/26/2021 1524   K 3.4 (L) 08/20/2022 0622   CL 105 08/20/2022 0622   CO2 28 08/20/2022 0622   GLUCOSE 100 (H) 08/20/2022 0622   BUN 14 08/20/2022 0622   BUN 14 12/26/2021 1524   CREATININE 0.58 (L) 08/20/2022 0622   CALCIUM 9.0 08/20/2022 0622   GFRNONAA >60  08/20/2022 0622   GFRAA 101 05/20/2020 0000    Lipid Panel     Component Value Date/Time   CHOL 170 11/11/2021 1006   TRIG 99 11/11/2021 1006   HDL 48 11/11/2021 1006   CHOLHDL 3.5 11/11/2021 1006   LDLCALC 103 (H) 11/11/2021 1006    CBC    Component Value Date/Time   WBC 8.7 08/20/2022 0622   RBC 4.96 08/20/2022 0622   HGB 14.2 08/20/2022 0622   HGB 14.4 12/26/2021 1524   HCT 42.5 08/20/2022 0622   HCT 43.5 12/26/2021 1524   PLT 397 08/20/2022 0622   PLT 202 12/26/2021 1524   MCV 85.7 08/20/2022 0622   MCV 90 12/26/2021 1524   MCH 28.6 08/20/2022 0622   MCHC 33.4 08/20/2022 0622   RDW 12.7 08/20/2022 0622   RDW 13.3 12/26/2021 1524   LYMPHSABS 1.2 04/19/2022 0952   MONOABS 1.2 (H) 04/19/2022 0952   EOSABS 0.0 04/19/2022 0952   BASOSABS 0.0 04/19/2022 0952    Hgb A1C Lab Results  Component Value Date   HGBA1C 5.1 10/17/2019            Assessment & Plan:   [place] for Atypical Pneumonia:  Hospital notes, labs and imaging reviewed He will start Doxycycline as prescribed by pulmonology He will follow up with pulmonology in 3 months for repeat CT  RTC in 3 months for follow-up of chronic conditions Webb Silversmith, NP

## 2022-08-26 ENCOUNTER — Encounter: Payer: Self-pay | Admitting: Physician Assistant

## 2022-08-26 ENCOUNTER — Ambulatory Visit: Payer: Medicare Other | Attending: Cardiology | Admitting: Physician Assistant

## 2022-08-26 VITALS — BP 110/78 | HR 68 | Ht 69.0 in | Wt 138.5 lb

## 2022-08-26 DIAGNOSIS — I10 Essential (primary) hypertension: Secondary | ICD-10-CM

## 2022-08-26 DIAGNOSIS — I249 Acute ischemic heart disease, unspecified: Secondary | ICD-10-CM

## 2022-08-26 DIAGNOSIS — I5032 Chronic diastolic (congestive) heart failure: Secondary | ICD-10-CM

## 2022-08-26 DIAGNOSIS — I2510 Atherosclerotic heart disease of native coronary artery without angina pectoris: Secondary | ICD-10-CM | POA: Diagnosis not present

## 2022-08-26 DIAGNOSIS — E785 Hyperlipidemia, unspecified: Secondary | ICD-10-CM | POA: Insufficient documentation

## 2022-08-26 DIAGNOSIS — R9389 Abnormal findings on diagnostic imaging of other specified body structures: Secondary | ICD-10-CM

## 2022-08-26 NOTE — Patient Instructions (Signed)
Medication Instructions:   Hold Chlorthalidone   *If you need a refill on your cardiac medications before your next appointment, please call your pharmacy*   Lab Work:  Your physician recommends that you return for lab work in: 4 weeks at the medical mall. You will need to be fasting.  No appt is needed. Hours are M-F 7AM- 6 PM.  If you have labs (blood work) drawn today and your tests are completely normal, you will receive your results only by: [place] (if you have MyChart) OR A paper copy in the mail If you have any lab test that is abnormal or we need to change your treatment, we will call you to review the results.   Testing/Procedures:  None Ordered   Follow-Up: At [facility], you and your health needs are our priority.  As part of our continuing mission to provide you with exceptional heart care, we have created designated Provider Care Teams.  These Care Teams include your primary Cardiologist (physician) and Advanced Practice Providers (APPs -  Physician Assistants and Nurse Practitioners) who all work together to provide you with the care you need, when you need it.  We recommend signing up for the patient portal called "MyChart".  Sign up information is provided on this After Visit Summary.  MyChart is used to connect with patients for Virtual Visits (Telemedicine).  Patients are able to view lab/test results, encounter notes, upcoming appointments, etc.  Non-urgent messages can be sent to your provider as well.   To learn more about what you can do with MyChart, go to [email or web address].    Your next appointment:   2 month(s)  The format for your next appointment:   In Person  Provider:   You may see Kate Sable, MD or one of the following Advanced Practice Providers on your designated Care Team:   Murray Hodgkins, NP Christell Faith, PA-C Cadence Kathlen Mody, PA-C Gerrie Nordmann, NP   Other Instructions  Please call or mychart your Blood  pressure reading after being off the Chlorthalidone

## 2022-09-01 ENCOUNTER — Encounter: Payer: Self-pay | Admitting: Internal Medicine

## 2022-09-01 DIAGNOSIS — J189 Pneumonia, unspecified organism: Secondary | ICD-10-CM

## 2022-09-01 DIAGNOSIS — R531 Weakness: Secondary | ICD-10-CM

## 2022-09-07 ENCOUNTER — Encounter: Payer: Self-pay | Admitting: Internal Medicine

## 2022-09-09 LAB — CULTURE, FUNGUS WITHOUT SMEAR

## 2022-09-09 MED ORDER — DIAZEPAM 5 MG PO TABS: ORAL_TABLET | ORAL | 0 refills | Status: DC

## 2022-09-15 ENCOUNTER — Telehealth: Payer: Self-pay | Admitting: Student in an Organized Health Care Education/Training Program

## 2022-09-15 NOTE — Telephone Encounter (Signed)
error 

## 2022-09-17 ENCOUNTER — Ambulatory Visit: Payer: Self-pay | Admitting: *Deleted

## 2022-09-17 NOTE — Telephone Encounter (Signed)
  Chief Complaint: Abdominal pain Symptoms: left sided "Right under ribs" 5-7/10, varies. "Better when sitting up and after eating. Worst in mornings, better after I'm up."   Diarrhea yesterday Frequency: 1-2 weeks, worsening Pertinent Negatives: Patient denies does not radiate, no nausea, vomiting Disposition: '[]'$ ED /'[]'$ Urgent Care (no appt availability in office) / '[]'$ Appointment(In office/virtual)/ '[]'$  [facility]/ '[]'$ Home Care/ '[]'$ Refused Recommended Disposition /'[]'$ [place] [facility]/ '[x]'$  Follow-up with PCP Additional Notes: Pt will only see Rollene Fare, next available next week. CAlled during practice lunch break.  Please advise. Reason for Disposition  [1] MODERATE pain (e.g., interferes with normal activities) AND [2] pain comes and goes (cramps) AND [3] present > 24 hours  (Exception: Pain with Vomiting or Diarrhea - see that Guideline.)  [1] MILD-MODERATE pain AND [2] constant AND [3] present > 2 hours    Intensity varies  Answer Assessment - Initial Assessment Questions 1. LOCATION: "Where does it hurt?"      Left side, right below ribs 2. RADIATION: "Does the pain shoot anywhere else?" (e.g., chest, back)     No 3. ONSET: "When did the pain begin?" (Minutes, hours or days ago)      1-2 weeks, worsening 4. SUDDEN: "Gradual or sudden onset?"     Suddenly 5. PATTERN "Does the pain come and go, or is it constant?"    - If it comes and goes: "How long does it last?" "Do you have pain now?"     (Note: Comes and goes means the pain is intermittent. It goes away completely between bouts.)    - If constant: "Is it getting better, staying the same, or getting worse?"      (Note: Constant means the pain never goes away completely; most serious pain is constant and gets worse.)      Constant but varies in intensity 6. SEVERITY: "How bad is the pain?"  (e.g., Scale 1-10; mild, moderate, or severe)    - MILD (1-3): Doesn't interfere with normal activities, abdomen soft and not tender  to touch.     - MODERATE (4-7): Interferes with normal activities or awakens from sleep, abdomen tender to touch.     - SEVERE (8-10): Excruciating pain, doubled over, unable to do any normal activities.       7/10 7. RECURRENT SYMPTOM: "Have you ever had this type of stomach pain before?" If Yes, ask: "When was the last time?" and "What happened that time?"      No 8. CAUSE: "What do you think is causing the stomach pain?"     Unsure 9. RELIEVING/AGGRAVATING FACTORS: "What makes it better or worse?" (e.g., antacids, bending or twisting motion, bowel movement)     Unsure 10. OTHER SYMPTOMS: "Do you have any other symptoms?" (e.g., back pain, diarrhea, fever, urination pain, vomiting)       Diarrhea yesterday  Protocols used: Abdominal Pain - Male-A-AH

## 2022-09-17 NOTE — Telephone Encounter (Signed)
There was a cancellation for tomorrow. I was able to schedule him for 9:40am 09/18/2022 (30mn)   Thanks,   -LMickel Baas

## 2022-09-17 NOTE — Telephone Encounter (Signed)
He is a 40 minute visit. Would recommend urgent care.

## 2022-09-18 ENCOUNTER — Encounter: Payer: Self-pay | Admitting: Internal Medicine

## 2022-09-18 ENCOUNTER — Ambulatory Visit (INDEPENDENT_AMBULATORY_CARE_PROVIDER_SITE_OTHER): Payer: Medicare Other | Admitting: Internal Medicine

## 2022-09-18 VITALS — BP 134/82 | HR 63 | Temp 96.8°F | Wt 137.0 lb

## 2022-09-18 DIAGNOSIS — K582 Mixed irritable bowel syndrome: Secondary | ICD-10-CM

## 2022-09-18 DIAGNOSIS — R110 Nausea: Secondary | ICD-10-CM

## 2022-09-18 DIAGNOSIS — R140 Abdominal distension (gaseous): Secondary | ICD-10-CM

## 2022-09-18 DIAGNOSIS — K219 Gastro-esophageal reflux disease without esophagitis: Secondary | ICD-10-CM

## 2022-09-18 DIAGNOSIS — R1084 Generalized abdominal pain: Secondary | ICD-10-CM

## 2022-09-18 DIAGNOSIS — R198 Other specified symptoms and signs involving the digestive system and abdomen: Secondary | ICD-10-CM | POA: Diagnosis not present

## 2022-09-18 NOTE — Addendum Note (Signed)
Addended by: [name] on: 09/18/2022 10:19 AM   Modules accepted: Orders

## 2022-09-18 NOTE — Addendum Note (Signed)
Addended by: [name] on: 09/18/2022 10:28 AM   Modules accepted: Orders

## 2022-09-18 NOTE — Progress Notes (Signed)
Subjective:    Patient ID: Grant Young, male    DOB: [birth year], 77 y.o.   MRN: [id number]  HPI  Patient presents to clinic today with complaint of abdominal pain.  He reports this started 1 to 2 weeks ago.  He describes the pain as.  The pain is worse first thing in the morning.  It is better with sitting up and after eating.  He has had some nausea, reflux, gassiness, constipation and diarrhea. He denies vomiting, blood in his stool. He denies recent changes in diet or medications. He has a history of GERD and IBS managed on Aciphex and Bentyl. He is not currently on Carafate. Colonoscopy from 09/2020 reviewed.  Review of Systems  Past Medical History:  Diagnosis Date   Anxiety    Per New Patient Packet   Chronic back pain    Per New Patient Packet   Chronic heart disease    Per New Patient Packet   Depression    GERD (gastroesophageal reflux disease)    Headache    History of gastritis    Per New Patient Packet   History of neuropathy    Per New Patient Packet   Hypertension    Per New Patient Packet   Insomnia    Kidney stone    Lumbar radicular pain    Per New Patient Packet   Lyme disease    Per New Patient Packet   Myocardial infarction [facility])    Overactive bladder    PONV (postoperative nausea and vomiting)    Skin cancer, basal cell    Spinal cord stimulator status    01/08/21 - not currently using.   Squamous cell skin cancer    Substance abuse (HCC)     Current Outpatient Medications  Medication Sig Dispense Refill   anastrozole (ARIMIDEX) 1 MG tablet 1/2 tablet (0.5 mg) by mouth once weekly     aspirin 81 MG chewable tablet Chew 1 tablet (81 mg total) by mouth daily. 90 tablet 3   Atogepant 60 MG TABS Take 60 mg by mouth once daily     butalbital-acetaminophen-caffeine (FIORICET) 50-325-40 MG tablet TAKE 1 TABLET BY MOUTH EVERY 6 HOURS AS NEEDED FOR HEADACHE. 90 tablet 0   cetirizine (ZYRTEC) 10 MG tablet Take 10 mg by mouth daily as needed for  allergies.     chlorthalidone (HYGROTON) 25 MG tablet Take 1 tablet by mouth daily.     Cholecalciferol 50 MCG (2000 UT) CAPS Take 1 tablet by mouth daily.     clopidogrel (PLAVIX) 75 MG tablet Take 1 tablet (75 mg) by mouth once daily 90 tablet 2   Cyanocobalamin (VITAMIN B-12) 5000 MCG LOZG Take 1 lozenge by mouth daily.      DHEA 25 MG CAPS Take 25 mg by mouth daily.     diazepam (VALIUM) 5 MG tablet TAKE 1 TABLET IN THE MORNING AND 2 TABLETS AT BEDTIME 90 tablet 0   dicyclomine (BENTYL) 10 MG capsule Take 1 capsule (10 mg total) by mouth 4 (four) times daily -  before meals and at bedtime. 90 capsule 1   Digestive Aids Mixture (DIGESTION GB PO) Take 1 capsule by mouth daily.     escitalopram (LEXAPRO) 20 MG tablet TAKE 1 TABLET BY MOUTH EVERY DAY 90 tablet 0   Evolocumab (REPATHA SURECLICK) 431 MG/ML SOAJ Inject 140 mg into the skin every 14 (fourteen) days. 2 mL 11   ezetimibe (ZETIA) 10 MG tablet Take 1 tablet (10 mg  total) by mouth daily. 90 tablet 1   ferrous sulfate 325 (65 FE) MG tablet Take 325 mg by mouth. Once daily on Mondays, Wednesdays and Fridays     HYDROcodone-acetaminophen (NORCO) 10-325 MG tablet Take 1 tablet by mouth every 8 (eight) hours as needed for severe pain. Must last 30 days. 90 tablet 0   [START ON 09/21/2022] HYDROcodone-acetaminophen (NORCO) 10-325 MG tablet Take 1 tablet by mouth every 8 (eight) hours as needed for severe pain. Must last 30 days. (Patient not taking: Reported on 08/26/2022) 90 tablet 0   [START ON 10/21/2022] HYDROcodone-acetaminophen (NORCO) 10-325 MG tablet Take 1 tablet by mouth every 8 (eight) hours as needed for severe pain. Must last 30 days. (Patient not taking: Reported on 08/26/2022) 90 tablet 0   ipratropium (ATROVENT) 0.06 % nasal spray Place 1 spray into both nostrils 4 (four) times daily as needed for rhinitis. For up to 5-7 days then stop. 15 mL 0   Loperamide HCl (IMODIUM PO) Take by mouth as needed.      MAGNESIUM BISGLYCINATE PO Take  by mouth daily.     melatonin 5 MG TABS Take 5 mg by mouth at bedtime.     NONFORMULARY OR COMPOUNDED ITEM Bi-Mix Papaverine '30mg'$ , Phentolamine '1mg'$    Dosage: Inject 1cc per injection    Vial 76m   Qty #5 Refills 6 5 each 6   nortriptyline (PAMELOR) 25 MG capsule Take 1 capsule (25 mg total) by mouth at bedtime. 90 capsule 0   Nutritional Supplements (NUTRITIONAL SUPPLEMENT PO) Take 1 capsule by mouth daily. [name]     polyethylene glycol (MIRALAX / GLYCOLAX) 17 g packet Take 17 g by mouth daily as needed.     POTASSIUM PO Take by mouth. Takes 5x weekly.     Probiotic Product (PROBIOTIC DAILY PO) Take by mouth daily.     prochlorperazine (COMPAZINE) 10 MG tablet Take 1 tablet (10 mg total) by mouth 2 (two) times daily as needed for nausea or vomiting. 180 tablet 0   propranolol ER (INDERAL LA) 60 MG 24 hr capsule Take 1 capsule (60 mg total) by mouth in the morning and at bedtime. 180 capsule 0   pyridostigmine (MESTINON) 60 MG tablet Take 0.5 tablets (30 mg total) by mouth 2 (two) times daily. 90 tablet 0   RABEprazole (ACIPHEX) 20 MG tablet TAKE 1 TABLET BY MOUTH TWICE A DAY 180 tablet 3   sucralfate (CARAFATE) 1 g tablet Take 1 g by mouth 4 (four) times daily as needed.      testosterone cypionate (DEPOTESTOSTERONE CYPIONATE) 200 MG/ML injection Inject 80 mg into the muscle every 14 (fourteen) days.     valACYclovir (VALTREX) 1000 MG tablet Take 2 tabs p.o. and repeat in 12 hours as needed for cold sore 30 tablet 0   Vibegron (GEMTESA) 75 MG TABS Take 75 mg by mouth daily. (Patient not taking: Reported on 08/26/2022) 90 tablet 3   No current facility-administered medications for this visit.    Allergies  Allergen Reactions   Ace Inhibitors     Other reaction(s): Cough   Fluoxetine Anxiety    "made me fall asleep" per pt "bad headaches and "makes  Me  Crazy" historical allergy noted in [employer] "made me fall asleep" per pt "bad headaches and "makes  Me   Crazy" Per New Patient Packet.     Metoclopramide     Other reaction(s): Other (See Comments), Other (See Comments), Unknown Tardive Dyskinesia  historical allergy  noted in [employer]  Per New Patient Packet.   Nalbuphine     Used Post Back surgery- Anesthesiologist Error. Patient had Narcotic Withdraw. Per New Patient Packet.    Other     Other reaction(s): Other (See Comments) Altered mental status in combo with narcotics at previous hospitalization - Full Withdrawal Symptoms Other reaction(s): Rash   Amoxicillin-Pot Clavulanate Nausea Only    Per New Patient Packet.   Doxazosin Rash    Other reaction(s): Other - See Comments, Rash UNKNOWN REACTION UNKNOWN REACTION    Duloxetine Nausea Only    Per New Patient Packet.    Penicillins Nausea Only    Per New Patient Packet.   Tamsulosin Itching and Anxiety    Restless, Flushing, Heavy Chest, Itching, Hyperactive mood and Anxiety. Unable to handle side effects. Per New Patient Packet.     Trazodone And Nefazodone Itching, Anxiety and Rash    Headache. "INCREASED MY ANXIETY AND HEARTRATE" Flushing, tachycardia "INCREASED MY ANXIETY AND HEARTRATE" Per New Patient Packet.    Amlodipine     Shaking, unsure of reaction. Per New Patient Packet.     Cinoxacin     GI Intolerance, and Dizziness. Per New Patient Packet.    Ciprofloxacin     Other reaction(s): Unknown   Fludrocortisone Other (See Comments)    "Worsening headaches, GI issues, fatigue"   Nebivolol     Other reaction(s): Unknown   Olanzapine     Headache and unable to sleep for 3 nights. Per New Patient Packet.    Olmesartan     Other reaction(s): Unknown   Pregabalin     Confusion, Lack of concentration, dizziness, and likely drowsiness. Per New Patient Packet.     Prostaglandins     Other reaction(s): Other (See Comments) Intolerance   Thyroid Hormones     Other reaction(s): Other (See Comments) Thyroid (Nature Thyroid) contraindicated  with some of your other medications.   Zolpidem     Nightmares, Ineffective after 2 days. Per New Patient Packet.    Duloxetine Hcl     Other reaction(s): Rash   Fluoxetine Hcl     Other reaction(s): Rash   Nucynta [Tapentadol] Other (See Comments)    Vertigo    Phenytoin Anxiety    Hyperactivity, and Ineffective. Per New Patient Packet.     Family History  Problem Relation Age of Onset   Heart disease Father    Heart failure Father    Hypertension Father    Stroke Father    Stroke Mother    Dementia Mother    Kidney Stones Daughter    Anxiety disorder Daughter    OCD Daughter     Social History   Socioeconomic History   Marital status: Married    Spouse name: Not on file   Number of children: Not on file   Years of education: Not on file   Highest education level: Not on file  Occupational History   Not on file  Tobacco Use   Smoking status: Never    Passive exposure: Never   Smokeless tobacco: Never  Vaping Use   Vaping Use: Never used  Substance and Sexual Activity   Alcohol use: Yes    Comment: 1 Drink a Month, socially   Drug use: Not Currently   Sexual activity: Yes    Birth control/protection: None  Other Topics Concern   Not on file  Social History Narrative   Tobacco use, amount per day now: None  Past tobacco use, amount per day: None   How many years did you use tobacco: 0   Alcohol use (drinks per week): 0-1 Month   Diet:   Do you drink/eat things with caffeine: Occasionally ( Hot Chocolate and maybe 1/4 of 16oz Pepsi 2-3 times a week.   Marital status: Married                                  What year were you married? 1970   Do you live in a house, apartment, assisted living, condo, trailer, etc.? House   Is it one or more stories? One   How many persons live in your home? 2   Do you have pets in your home?( please list)  No   Highest Level of education completed: Masters   Current or past profession: Intensive Transport planner for [employer]   Do you exercise? Yes                                    Type and how often? [place], Recumbent Bike, 3 times a week. Try to get a 1.2 mile walk at least 3 times a week or more.     Do you have a living will? Yes   Do you have a DNR form?  No                                 If not, do you want to discuss one?   Do you have signed POA/HPOA forms? Yes                       If so, please bring to you appointment    Do you have any difficulty bathing or dressing yourself? No    Do you have difficulty preparing food or eating? No   Do you have difficulty managing your medications? No   Do you have any difficulty managing your finances? No   Do you have any difficulty affording your medications? No         Social Determinants of Radio broadcast assistant Strain: Not on file  Food Insecurity: Not on file  Transportation Needs: Not on file  Physical Activity: Not on file  Stress: Not on file  Social Connections: Not on file  Intimate Partner Violence: Not on file     Constitutional: Pt reports headaches. Denies fever, malaise, fatigue, or abrupt weight changes.  Respiratory: Denies difficulty breathing, shortness of breath, cough or sputum production.   Cardiovascular: Denies chest pain, chest tightness, palpitations or swelling in the hands or feet.  Gastrointestinal: Patient reports nausea, abdominal pain, bloating, gassiness, constipation and diarrhea.  Denies blood in the stool.  GU: Pt reports urinary frequency. Denies urgency, pain with urination, burning sensation, blood in urine, odor or discharge. Musculoskeletal: Denies decrease in range of motion, difficulty with gait, muscle pain or joint pain and swelling.  Skin: Denies redness, rashes, lesions or ulcercations.   No other specific complaints in a complete review of systems (except as listed in HPI above).     Objective:   Physical Exam   BP 134/82 (BP Location: Left Arm, Patient Position:  Sitting, Cuff Size: Normal)   Pulse 63   Temp Marland Kitchen)  96.8 F (36 C) (Temporal)   Wt 137 lb (62.1 kg)   SpO2 98%   BMI 20.23 kg/m   Wt Readings from Last 3 Encounters:  08/26/22 138 lb 8 oz (62.8 kg)  08/25/22 136 lb (61.7 kg)  08/18/22 139 lb 15.9 oz (63.5 kg)    General: Appears his stated age, well developed, well nourished in NAD. Skin: Warm, dry and intact.  HEENT: Head: normal shape and size; Eyes: sclera white, no icterus, conjunctiva pink, PERRLA and EOMs intact;  Cardiovascular: Normal rate and rhythm. S1,S2 noted.  No murmur, rubs or gallops noted. No JVD or BLE edema. No carotid bruits noted. Pulmonary/Chest: Normal effort and positive vesicular breath sounds. No respiratory distress. No wheezes, rales or ronchi noted.  Abdomen: Soft and generally tender.  Hyperactive bowel sounds. No distention or masses noted. Liver, spleen and kidneys non palpable. Musculoskeletal:  No difficulty with gait.  Neurological: Alert and oriented.    BMET    Component Value Date/Time   NA 139 08/20/2022 0622   NA 146 (H) 12/26/2021 1524   K 3.4 (L) 08/20/2022 0622   CL 105 08/20/2022 0622   CO2 28 08/20/2022 0622   GLUCOSE 100 (H) 08/20/2022 0622   BUN 14 08/20/2022 0622   BUN 14 12/26/2021 1524   CREATININE 0.58 (L) 08/20/2022 0622   CALCIUM 9.0 08/20/2022 0622   GFRNONAA >60 08/20/2022 0622   GFRAA 101 05/20/2020 0000    Lipid Panel     Component Value Date/Time   CHOL 170 11/11/2021 1006   TRIG 99 11/11/2021 1006   HDL 48 11/11/2021 1006   CHOLHDL 3.5 11/11/2021 1006   LDLCALC 103 (H) 11/11/2021 1006    CBC    Component Value Date/Time   WBC 8.7 08/20/2022 0622   RBC 4.96 08/20/2022 0622   HGB 14.2 08/20/2022 0622   HGB 14.4 12/26/2021 1524   HCT 42.5 08/20/2022 0622   HCT 43.5 12/26/2021 1524   PLT 397 08/20/2022 0622   PLT 202 12/26/2021 1524   MCV 85.7 08/20/2022 0622   MCV 90 12/26/2021 1524   MCH 28.6 08/20/2022 0622   MCHC 33.4 08/20/2022 0622   RDW 12.7  08/20/2022 0622   RDW 13.3 12/26/2021 1524   LYMPHSABS 1.2 04/19/2022 0952   MONOABS 1.2 (H) 04/19/2022 0952   EOSABS 0.0 04/19/2022 0952   BASOSABS 0.0 04/19/2022 0952    Hgb A1C Lab Results  Component Value Date   HGBA1C 5.1 10/17/2019           Assessment & Plan:   Nausea, Bloating, Gassiness, Abdominal Pain, Diarrhea, Constipation, IBS and GERD:  No indication for COVID or flu testing given duration of symptoms We will check CBC, c-Met, amylase, lipase, GI pathogen panel and H. pylori stool test Advised him to take the Carafate 3-4 times a day in addition to his Aciphex and Bentyl Encouraged bland diet Consider CT abdomen/pelvis if symptoms persist or worsen  RTC in 3 months, follow-up chronic conditions Webb Silversmith, NP

## 2022-09-18 NOTE — Patient Instructions (Signed)

## 2022-09-19 ENCOUNTER — Other Ambulatory Visit: Payer: Self-pay | Admitting: Internal Medicine

## 2022-09-19 LAB — COMPLETE METABOLIC PANEL WITH GFR
AG Ratio: 1.6 (calc) (ref 1.0–2.5)
ALT: 12 U/L (ref 9–46)
AST: 14 U/L (ref 10–35)
Albumin: 4.1 g/dL (ref 3.6–5.1)
Alkaline phosphatase (APISO): 114 U/L (ref 35–144)
BUN: 11 mg/dL (ref 7–25)
CO2: 32 mmol/L (ref 20–32)
Calcium: 9.5 mg/dL (ref 8.6–10.3)
Chloride: 104 mmol/L (ref 98–110)
Creat: 0.76 mg/dL (ref 0.70–1.28)
Globulin: 2.6 g/dL (calc) (ref 1.9–3.7)
Glucose, Bld: 88 mg/dL (ref 65–99)
Potassium: 4.9 mmol/L (ref 3.5–5.3)
Sodium: 143 mmol/L (ref 135–146)
Total Bilirubin: 0.5 mg/dL (ref 0.2–1.2)
Total Protein: 6.7 g/dL (ref 6.1–8.1)
eGFR: 94 mL/min/{1.73_m2} (ref >=60)

## 2022-09-19 LAB — CBC
HCT: 48.8 % (ref 38.5–50.0)
Hemoglobin: 16.1 g/dL (ref 13.2–17.1)
MCH: 29.7 pg (ref 27.0–33.0)
MCHC: 33 g/dL (ref 32.0–36.0)
MCV: 89.9 fL (ref 80.0–100.0)
MPV: 10.1 fL (ref 7.5–12.5)
Platelets: 264 10*3/uL (ref 140–400)
RBC: 5.43 10*6/uL (ref 4.20–5.80)
RDW: 13.5 % (ref 11.0–15.0)
WBC: 6 10*3/uL (ref 3.8–10.8)

## 2022-09-19 LAB — AMYLASE: Amylase: 32 U/L (ref 21–101)

## 2022-09-19 LAB — LIPASE: Lipase: 14 U/L (ref 7–60)

## 2022-09-23 LAB — GI PROFILE, STOOL, PCR

## 2022-09-23 LAB — H. PYLORI ANTIGEN, STOOL: H pylori Ag, Stl: NEGATIVE

## 2022-09-24 ENCOUNTER — Encounter: Payer: Self-pay | Admitting: Internal Medicine

## 2022-09-24 ENCOUNTER — Other Ambulatory Visit
Admission: RE | Admit: 2022-09-24 | Discharge: 2022-09-24 | Disposition: A | Discharge status: Home / Self Care | Payer: Medicare Other | Source: Ambulatory Visit | Attending: Physician Assistant | Admitting: Physician Assistant

## 2022-09-24 DIAGNOSIS — E785 Hyperlipidemia, unspecified: Secondary | ICD-10-CM | POA: Insufficient documentation

## 2022-09-24 DIAGNOSIS — I2510 Atherosclerotic heart disease of native coronary artery without angina pectoris: Secondary | ICD-10-CM

## 2022-09-24 DIAGNOSIS — R9389 Abnormal findings on diagnostic imaging of other specified body structures: Secondary | ICD-10-CM | POA: Diagnosis present

## 2022-09-24 LAB — HEPATIC FUNCTION PANEL
ALT: 14 U/L (ref 0–44)
AST: 18 U/L (ref 15–41)
Albumin: 3.8 g/dL (ref 3.5–5.0)
Alkaline Phosphatase: 107 U/L (ref 38–126)
Bilirubin, Direct: 0.1 mg/dL (ref 0.0–0.2)
Indirect Bilirubin: 0.6 mg/dL (ref 0.3–0.9)
Total Bilirubin: 0.7 mg/dL (ref 0.3–1.2)
Total Protein: 6.9 g/dL (ref 6.5–8.1)

## 2022-09-24 LAB — LIPID PANEL
Cholesterol: 105 mg/dL (ref 0–200)
HDL: 49 mg/dL (ref >40)
LDL Cholesterol: 34 mg/dL (ref 0–99)
Total CHOL/HDL Ratio: 2.1 RATIO
Triglycerides: 110 mg/dL (ref <150)
VLDL: 22 mg/dL (ref 0–40)

## 2022-09-25 ENCOUNTER — Other Ambulatory Visit: Payer: Self-pay | Admitting: Internal Medicine

## 2022-09-25 DIAGNOSIS — F419 Anxiety disorder, unspecified: Secondary | ICD-10-CM

## 2022-09-25 NOTE — Telephone Encounter (Signed)
Requested Prescriptions  Pending Prescriptions Disp Refills   escitalopram (LEXAPRO) 20 MG tablet [Pharmacy Med Name: ESCITALOPRAM 20 MG TABLET] 90 tablet 1    Sig: TAKE 1 TABLET BY MOUTH EVERY DAY     Psychiatry:  Antidepressants - SSRI Passed - 09/25/2022  1:54 AM      Passed - Completed PHQ-2 or PHQ-9 in the last 360 days      Passed - Valid encounter within last 6 months    Recent Outpatient Visits           1 week ago Generalized abdominal pain   [facility] [place], [name], NP   1 month ago Atypical pneumonia   [facility] [place], [name], NP   2 months ago Essential hypertension   [facility], Grayland Ormond A, RPH-CPP   3 months ago Intractable chronic migraine without aura and without status migrainosus   [facility] [place], [name], NP   3 months ago Essential hypertension   [place], Virl Diamond, [place]       Future Appointments             In 3 weeks Hilty, Nadean Corwin, MD [facility], [facility]   In 1 month Dunn, Areta Haber, PA-C [place]. [place]   In 1 month [name], [name], NP [facility], [place]   In 7 months Diamantina [place], Herbert Seta, MD [place]

## 2022-09-26 ENCOUNTER — Encounter (HOSPITAL_BASED_OUTPATIENT_CLINIC_OR_DEPARTMENT_OTHER): Payer: Self-pay | Admitting: Internal Medicine

## 2022-09-29 ENCOUNTER — Ambulatory Visit
Payer: Medicare Other | Attending: Student in an Organized Health Care Education/Training Program | Admitting: Student in an Organized Health Care Education/Training Program

## 2022-09-29 ENCOUNTER — Encounter: Payer: Self-pay | Admitting: Student in an Organized Health Care Education/Training Program

## 2022-09-29 VITALS — BP 175/93 | HR 66 | Temp 97.2°F | Ht 69.0 in | Wt 137.0 lb

## 2022-09-29 DIAGNOSIS — G894 Chronic pain syndrome: Secondary | ICD-10-CM | POA: Insufficient documentation

## 2022-09-29 DIAGNOSIS — G5703 Lesion of sciatic nerve, bilateral lower limbs: Secondary | ICD-10-CM | POA: Insufficient documentation

## 2022-09-29 DIAGNOSIS — M533 Sacrococcygeal disorders, not elsewhere classified: Secondary | ICD-10-CM | POA: Diagnosis present

## 2022-09-29 DIAGNOSIS — M47816 Spondylosis without myelopathy or radiculopathy, lumbar region: Secondary | ICD-10-CM | POA: Diagnosis present

## 2022-09-29 DIAGNOSIS — G8929 Other chronic pain: Secondary | ICD-10-CM | POA: Insufficient documentation

## 2022-09-29 NOTE — Patient Instructions (Addendum)
Bilateral lumbar facet blocks (repeat) B/L SI-J (new) B/L Piriformis TPI (new)  Stop Plavix for 7 days, take ASA 81 mg instead Do not eat or drink for 8 hours Bring a driver

## 2022-09-29 NOTE — Progress Notes (Signed)
Safety precautions to be maintained throughout the outpatient stay will include: orient to surroundings, keep bed in low position, maintain call bell within reach at all times, provide assistance with transfer out of bed and ambulation.  

## 2022-09-29 NOTE — Progress Notes (Signed)
PROVIDER NOTE: Information contained herein reflects review and annotations entered in association with encounter. Interpretation of such information and data should be left to medically-trained personnel. Information provided to patient can be located elsewhere in the medical record under "Patient Instructions". Document created using STT-dictation technology, any transcriptional errors that may result from process are unintentional.    Patient: Grant Young  Service Category: E/M  Provider: [name], MD  DOB: Sep 25, 1945  DOS: 09/29/2022  Specialty: Interventional Pain Management  MRN: [id number]  Setting: Ambulatory outpatient  PCP: [name], NP  Type: Established Patient    Referring Provider: [name], NP  Location: Office  Delivery: Face-to-face     HPI  Mr. BLASE BECKNER, a 77 y.o. year old male, is here today because of his Lumbar facet arthropathy [M47.816]. Mr. Hammerschmidt primary complain today is Back Pain (lower) Last encounter: My last encounter with him was on 07/30/2022 Pertinent problems: Mr. Pasha has Lumbar facet arthropathy and Chronic pain syndrome on their pertinent problem list. Pain Assessment: Severity of Chronic pain is reported as a 7 /10. Location: Back Right, Left/Denies. Onset: More than a month ago. Quality: Aching, Constant, Nagging, Throbbing, Stabbing, Shooting. Timing: Constant. Modifying factor(s): ice, meds. Vitals:  height is '5\' 9"'$  (1.753 m) and weight is 137 lb (62.1 kg). His temperature is 97.2 F (36.2 C) (abnormal). His blood pressure is 175/93 (abnormal) and his pulse is 66. His oxygen saturation is 100%.   Reason for encounter: evaluation of worsening, or previously known (established) problem.   Patient having increased axial lumbar spine pain as well as bilateral SI joint pain and symptoms consistent with piriformis syndrome.  He states that the pain has been very severe and he has been uncomfortable and unable to perform ADLs as efficiently  as before. He has a history of lumbar spinal fusion, his previous lumbar facet medial branch nerve blocks were in 2022 that did provide him with 60 to 75% pain relief for over 6 months at that time.  He does have pain overlying his SI joint and positive physical exam findings as noted below.   Pharmacotherapy Assessment  Analgesic: Hydrocodone IR 10 mg BID-TID prn    Monitoring: [place] PMP: PDMP reviewed during this encounter.       Pharmacotherapy: No side-effects or adverse reactions reported. Compliance: No problems identified. Effectiveness: Clinically acceptable.  Chauncey Fischer, RN  09/29/2022 10:11 AM  Sign when Signing Visit Safety precautions to be maintained throughout the outpatient stay will include: orient to surroundings, keep bed in low position, maintain call bell within reach at all times, provide assistance with transfer out of bed and ambulation.      UDS:  Summary  Date Value Ref Range Status  09/17/2020 Note  Final    Comment:    ==================================================================== Compliance Drug Analysis, Ur ==================================================================== Specimen Alert Note: Urinary creatinine is low; ability to detect some drugs may be compromised. Interpret results with caution. (Creatinine) ==================================================================== Test                             Result       Flag       Units  Drug Present and Declared for Prescription Verification   Lorazepam                      1107         EXPECTED   ng/mg creat  Source of lorazepam is a scheduled prescription medication.    Butalbital                     PRESENT      EXPECTED   Citalopram                     PRESENT      EXPECTED   Desmethylcitalopram            PRESENT      EXPECTED    Desmethylcitalopram is an expected metabolite of citalopram or the    enantiomeric form, escitalopram.    Acetaminophen                  PRESENT       EXPECTED   Propranolol                    PRESENT      EXPECTED  Drug Present not Declared for Prescription Verification   Oxazepam                       300          UNEXPECTED ng/mg creat   Temazepam                      357          UNEXPECTED ng/mg creat    Oxazepam and temazepam are expected metabolites of diazepam.    Oxazepam is also an expected metabolite of other benzodiazepine    drugs, including chlordiazepoxide, prazepam, clorazepate, halazepam,    and temazepam.  Oxazepam and temazepam are available as scheduled    prescription medications.  Drug Absent but Declared for Prescription Verification   Hydrocodone                    Not Detected UNEXPECTED ng/mg creat   Tizanidine                     Not Detected UNEXPECTED    Tizanidine, as indicated in the declared medication list, is not    always detected even when used as directed.    Amitriptyline                  Not Detected UNEXPECTED   Prochlorperazine               Not Detected UNEXPECTED   Salicylate                     Not Detected UNEXPECTED    Aspirin, as indicated in the declared medication list, is not always    detected even when used as directed.  ==================================================================== Test                      Result    Flag   Units      Ref Range   Creatinine              14        LL     mg/dL      >=20 ==================================================================== Declared Medications:  The flagging and interpretation on this report are based on the  following declared medications.  Unexpected results may arise from  inaccuracies in the declared medications.   **Note: The testing scope of this panel includes these medications:   Amitriptyline (Elavil)  Butalbital (  Fioricet)  Butalbital (Fiorinal)  Escitalopram (Lexapro)  Hydrocodone (Norco)  Lorazepam (Ativan)  Prochlorperazine (Compazine)  Propranolol (Inderal)   **Note: The testing scope of this panel  does not include small to  moderate amounts of these reported medications:   Acetaminophen (Fioricet)  Acetaminophen (Norco)  Aspirin (Fiorinal)  Tizanidine (Zanaflex)   **Note: The testing scope of this panel does not include the  following reported medications:   Anastrozole (Arimidex)  Caffeine (Fioricet)  Caffeine (Fiorinal)  Chlorthalidone (Hygroton)  Cholecalciferol  Cholestyramine (Questran)  Dicyclomine (Bentyl)  Ezetimibe (Zetia)  Hydrocortisone  Melatonin  Ondansetron (Zofran)  Potassium (Klor-Con)  Rabeprazole (Aciphex)  Sucralfate (Carafate)  Valacyclovir (Valtrex)  Vitamin B12 ==================================================================== For clinical consultation, please call [phone]. ====================================================================      ROS  Constitutional: Denies any fever or chills Gastrointestinal: No reported hemesis, hematochezia, vomiting, or acute GI distress Musculoskeletal:  Low back pain bilateral SI joint, bilateral piriformis Neurological: No reported episodes of acute onset apraxia, aphasia, dysarthria, agnosia, amnesia, paralysis, loss of coordination, or loss of consciousness  Medication Review  Atogepant, Cholecalciferol, DHEA, Digestive Aids Mixture, Evolocumab, HYDROcodone-acetaminophen, Loperamide HCl, Magnesium Bisglycinate, NONFORMULARY OR COMPOUNDED ITEM, Nutritional Supplements, Potassium, Probiotic Product, RABEprazole, Vitamin B-12, anastrozole, aspirin, butalbital-acetaminophen-caffeine, cetirizine, clopidogrel, diazepam, dicyclomine, escitalopram, ezetimibe, ferrous sulfate, ipratropium, melatonin, nortriptyline, polyethylene glycol, prochlorperazine, propranolol ER, pyridostigmine, sucralfate, testosterone cypionate, and valACYclovir  History Review  Allergy: Mr. Zuercher is allergic to ace inhibitors, fluoxetine, metoclopramide, nalbuphine, other, amoxicillin-pot clavulanate, doxazosin, duloxetine,  penicillins, tamsulosin, trazodone and nefazodone, amlodipine, cinoxacin, ciprofloxacin, fludrocortisone, nebivolol, olanzapine, olmesartan, pregabalin, prostaglandins, thyroid hormones, zolpidem, duloxetine hcl, fluoxetine hcl, nucynta [tapentadol], and phenytoin. Drug: Mr. Adell  reports that he does not currently use drugs. Alcohol:  reports current alcohol use. Tobacco:  reports that he has never smoked. He has never been exposed to tobacco smoke. He has never used smokeless tobacco. Social: Mr. Million  reports that he has never smoked. He has never been exposed to tobacco smoke. He has never used smokeless tobacco. He reports current alcohol use. He reports that he does not currently use drugs. Medical:  has a past medical history of Anxiety, Chronic back pain, Chronic heart disease, Depression, GERD (gastroesophageal reflux disease), Headache, History of gastritis, History of neuropathy, Hypertension, Insomnia, Kidney stone, Lumbar radicular pain, Lyme disease, Myocardial infarction ([place]), Overactive bladder, PONV (postoperative nausea and vomiting), Skin cancer, basal cell, Spinal cord stimulator status, Squamous cell skin cancer, and Substance abuse ([place]). Surgical: Mr. Amor  has a past surgical history that includes Kidney stone surgery (09/14/1980); Kidney stone surgery (09/15/1995); Lithotripsy (09/14/2014); Lithotripsy (09/14/1996); Lithotripsy (09/14/1997); Gallbladder surgery (09/15/2015); Sigmoidoscopy (09/14/2017); Colonoscopy (09/15/2015); Cholecystectomy (2017); Shoulder surgery (Right, 1984); Back surgery; Spinal cord stimulator implant; Pain pump implantation; Pain pump removal; Tonsillectomy; Foot surgery (Bilateral, 03/18/2020); Foot surgery (08/032021); Upper gi endoscopy; Cardiac catheterization; Colonoscopy with propofol (N/A, 10/03/2020); Esophagogastroduodenoscopy (egd) with propofol (N/A, 10/03/2020); Cataract extraction w/PHACO (Left, 01/14/2021); Cataract extraction w/PHACO (Right,  01/28/2021); Coronary/Graft Acute MI Revascularization (N/A, 11/27/2021); and LEFT HEART CATH AND CORONARY ANGIOGRAPHY (N/A, 11/27/2021). Family: family history includes Anxiety disorder in his daughter; Dementia in his mother; Heart disease in his father; Heart failure in his father; Hypertension in his father; Kidney Stones in his daughter; OCD in his daughter; Stroke in his father and mother.  Laboratory Chemistry Profile   Renal Lab Results  Component Value Date   BUN 11 09/18/2022   CREATININE 0.76 09/18/2022   BCR SEE NOTE: 09/18/2022   GFRAA 101 05/20/2020   GFRNONAA >60 08/20/2022  Hepatic Lab Results  Component Value Date   AST 18 09/24/2022   ALT 14 09/24/2022   ALBUMIN 3.8 09/24/2022   ALKPHOS 107 09/24/2022   AMYLASE 32 09/18/2022   LIPASE 14 09/18/2022    Electrolytes Lab Results  Component Value Date   NA 143 09/18/2022   K 4.9 09/18/2022   CL 104 09/18/2022   CALCIUM 9.5 09/18/2022   MG 2.0 08/20/2022   PHOS 3.6 11/30/2021    Bone Lab Results  Component Value Date   TESTOSTERONE 33.2 05/20/2020    Inflammation (CRP: Acute Phase) (ESR: Chronic Phase) Lab Results  Component Value Date   CRP 22.9 (H) 08/18/2022   LATICACIDVEN 1.4 08/18/2022         Note: Above Lab results reviewed.   Physical Exam  General appearance: Well nourished, well developed, and well hydrated. In no apparent acute distress Mental status: Alert, oriented x 3 (person, place, & time)       Respiratory: No evidence of acute respiratory distress Eyes: PERLA Vitals: BP (!) 175/93   Pulse 66   Temp (!) 97.2 F (36.2 C)   Ht '5\' 9"'$  (1.753 m)   Wt 137 lb (62.1 kg)   SpO2 100%   BMI 20.23 kg/m  BMI: Estimated body mass index is 20.23 kg/m as calculated from the following:   Height as of this encounter: '5\' 9"'$  (1.753 m).   Weight as of this encounter: 137 lb (62.1 kg). Ideal: Ideal body weight: 70.7 kg (155 lb 13.8 oz)  Lumbar Spine Area Exam  Skin & Axial Inspection: Well  healed scar from previous spine surgery detected IPG present from SCS Alignment: Scoliosis detected Functional ROM: Pain restricted ROM       Stability: No instability detected Muscle Tone/Strength: Functionally intact. No obvious neuro-muscular anomalies detected. Sensory (Neurological): Facetogenic pain pattern, pain with facet loading  Hyperextension/rotation test: (+) bilaterally for facet joint pain. Lumbar quadrant test (Kemp's test): (+) bilaterally for facet joint pain. Lateral bending test: deferred today       Patrick's Maneuver: (+) for bilateral S-I arthralgia  FABER* test: (+) for bilateral S-I arthralgia   S-I anterior distraction/compression test: (+) for bilateral S-I arthralgia  S-I lateral compression test: (+) for bilateral S-I arthralgia  S-I Thigh-thrust test: (+) for bilateral S-I arthralgia  S-I Gaenslen's test: (+) for bilateral S-I arthralgia  *(Flexion, ABduction and External Rotation)  Gait & Posture Assessment  Ambulation: Unassisted Gait: Relatively normal for age and body habitus Posture: WNL    Lower Extremity Exam      Side: Right lower extremity   Side: Left lower extremity  Stability: No instability observed           Stability: No instability observed          Skin & Extremity Inspection: Skin color, temperature, and hair growth are WNL. No peripheral edema or cyanosis. No masses, redness, swelling, asymmetry, or associated skin lesions. No contractures.   Skin & Extremity Inspection: Skin color, temperature, and hair growth are WNL. No peripheral edema or cyanosis. No masses, redness, swelling, asymmetry, or associated skin lesions. No contractures.  Functional ROM: Unrestricted ROM                   Functional ROM: Unrestricted ROM                  Muscle Tone/Strength: Functionally intact. No obvious neuro-muscular anomalies detected.   Muscle Tone/Strength: Functionally intact. No obvious neuro-muscular anomalies detected.  Sensory  (Neurological): Unimpaired         Sensory (Neurological): Unimpaired        DTR: Patellar: deferred today Achilles: deferred today Plantar: deferred today   DTR: Patellar: deferred today Achilles: deferred today Plantar: deferred today  Palpation: No palpable anomalies   Palpation: No palpable anomalies     Assessment   Status Diagnosis  Having a Flare-up Having a Flare-up Having a Flare-up 1. Lumbar facet arthropathy   2. Lumbar spondylosis   3. Chronic SI joint pain   4. SI (sacroiliac) joint dysfunction   5. Chronic pain syndrome   6. Piriformis syndrome of both sides        Plan of Care   1. Lumbar facet arthropathy - LUMBAR FACET(MEDIAL BRANCH NERVE BLOCK) MBNB; Future  2. Lumbar spondylosis - LUMBAR FACET(MEDIAL BRANCH NERVE BLOCK) MBNB; Future  3. Chronic SI joint pain - SACROILIAC JOINT INJECTION; Future  4. SI (sacroiliac) joint dysfunction - SACROILIAC JOINT INJECTION; Future  5. Chronic pain syndrome - LUMBAR FACET(MEDIAL BRANCH NERVE BLOCK) MBNB; Future - SACROILIAC JOINT INJECTION; Future - TRIGGER POINT INJECTION; Future  6. Piriformis syndrome of both sides - TRIGGER POINT INJECTION; Future  Patient instructed to discontinue Plavix for 7 days prior to his procedure, okay to take aspirin 81 mg instead.   Follow-up plan:   Return in about 13 days (around 10/12/2022) for B/L L3, 4, 5 MBNB, B/l SI-J, B/L Piriformis (dc Plavix for 7 days), in clinic NS.     Status post Botox No. 1 on 01/01/2020 for migraine, lumbar TPI as well. SPG block 03/13/20. Bilateral GONB 04/01/20. 11/13/20 B/L L3,4,5 Facet blocks helpful repeat prn           Recent Visits Date Type Provider Dept  07/30/22 Office Visit [name], MD [facility]  Showing recent visits within past 90 days and meeting all other requirements Today's Visits Date Type Provider Dept  09/29/22 Office Visit [name], MD [facility]  Showing today's visits and meeting all  other requirements Future Appointments No visits were found meeting these conditions. Showing future appointments within next 90 days and meeting all other requirements  I discussed the assessment and treatment plan with the patient. The patient was provided an opportunity to ask questions and all were answered. The patient agreed with the plan and demonstrated an understanding of the instructions.  Patient advised to call back or seek an in-person evaluation if the symptoms or condition worsens.  Duration of encounter: 61mnutes.  Note by: BGillis Santa MD Date: 09/29/2022; Time: 2:09 PM

## 2022-10-08 ENCOUNTER — Encounter: Payer: Self-pay | Admitting: Internal Medicine

## 2022-10-15 ENCOUNTER — Other Ambulatory Visit (HOSPITAL_COMMUNITY): Payer: Self-pay

## 2022-10-15 ENCOUNTER — Telehealth: Payer: Self-pay

## 2022-10-15 NOTE — Telephone Encounter (Signed)
Pharmacy Patient Advocate Encounter  Prior Authorization for REPATHA 140 MG/ML INJ has been approved.    PA# [id number] Effective dates: 09/14/22 through 10/15/23   Received notification from [place] that prior authorization for REPATHA 140 MG/ML INJ is needed.    PA submitted on 10/15/22 Key [id number] Status is pending  Karie Soda, [place] Patient Advocate Specialist Direct Number: [phone] Fax: [phone]

## 2022-10-16 ENCOUNTER — Other Ambulatory Visit: Payer: Self-pay | Admitting: Internal Medicine

## 2022-10-16 NOTE — Telephone Encounter (Signed)
Requested Prescriptions  Pending Prescriptions Disp Refills   propranolol ER (INDERAL LA) 60 MG 24 hr capsule [Pharmacy Med Name: PROPRANOLOL ER 60 MG CAPSULE] 180 capsule 1    Sig: TAKE 1 CAPSULE (60 MG TOTAL) BY MOUTH IN THE MORNING AND AT BEDTIME.     Cardiovascular:  Beta Blockers Failed - 10/16/2022  1:38 AM      Failed - Last BP in normal range    BP Readings from Last 1 Encounters:  09/29/22 (!) 175/93         Passed - Last Heart Rate in normal range    Pulse Readings from Last 1 Encounters:  09/29/22 66         Passed - Valid encounter within last 6 months    Recent Outpatient Visits           4 weeks ago Generalized abdominal pain   [facility] [place], [name], NP   1 month ago Atypical pneumonia   [facility] [place], [name], NP   3 months ago Essential hypertension   [place], Grayland Ormond A, RPH-CPP   4 months ago Intractable chronic migraine without aura and without status migrainosus   [facility] [place], [name], NP   4 months ago Essential hypertension   [place], RPH-CPP       Future Appointments             In 3 days Hilty, Nadean Corwin, MD [facility], [place]   In 2 weeks Dunn, Areta Haber, PA-C [place] [facility] [place]   In 1 month [name], [name], NP [facility], [place]   In 7 months Diamantina [place], Herbert Seta, MD [place]

## 2022-10-19 ENCOUNTER — Encounter: Payer: Self-pay | Admitting: Internal Medicine

## 2022-10-19 ENCOUNTER — Encounter (HOSPITAL_BASED_OUTPATIENT_CLINIC_OR_DEPARTMENT_OTHER): Payer: Self-pay

## 2022-10-19 ENCOUNTER — Telehealth: Payer: Self-pay | Admitting: Internal Medicine

## 2022-10-19 ENCOUNTER — Telehealth: Payer: Self-pay

## 2022-10-19 ENCOUNTER — Telehealth (HOSPITAL_BASED_OUTPATIENT_CLINIC_OR_DEPARTMENT_OTHER): Payer: Medicare Other | Admitting: Internal Medicine

## 2022-10-19 MED ORDER — PROPRANOLOL HCL ER 80 MG PO CP24: 80.0000 mg | ORAL_CAPSULE | Freq: Two times a day (BID) | ORAL | 0 refills | Status: DC

## 2022-10-19 MED ORDER — DIAZEPAM 5 MG PO TABS: ORAL_TABLET | ORAL | 0 refills | Status: DC

## 2022-10-19 NOTE — Telephone Encounter (Signed)
Patient stated he was in the virtual visit meeting room and no one showed for his visit with Dr. Debara Pickett.

## 2022-10-19 NOTE — Telephone Encounter (Signed)
Scheduled pt for 2/6 at 9:30 for a virtual visit. Process explained to patient and recommended he changed settings on phone so he can receive the call.

## 2022-10-19 NOTE — Telephone Encounter (Signed)
Patient stated he did as instructed to prepare for 11 AM virtual visit today, but nobody was on the other end of the call.

## 2022-10-19 NOTE — Telephone Encounter (Signed)
Patient was called twice and LM for video visit. Sarah RN spoke with patient about the processed and r/s him.

## 2022-10-20 ENCOUNTER — Encounter: Payer: Self-pay | Admitting: Internal Medicine

## 2022-10-20 ENCOUNTER — Ambulatory Visit: Payer: Medicare Other | Attending: Internal Medicine | Admitting: Internal Medicine

## 2022-10-20 VITALS — BP 141/81 | HR 64 | Ht 69.0 in | Wt 139.0 lb

## 2022-10-20 DIAGNOSIS — Z789 Other specified health status: Secondary | ICD-10-CM | POA: Insufficient documentation

## 2022-10-20 DIAGNOSIS — I2510 Atherosclerotic heart disease of native coronary artery without angina pectoris: Secondary | ICD-10-CM | POA: Diagnosis not present

## 2022-10-20 DIAGNOSIS — E785 Hyperlipidemia, unspecified: Secondary | ICD-10-CM | POA: Diagnosis not present

## 2022-10-20 NOTE — Patient Instructions (Signed)
Medication Instructions:   Your physician recommends that you continue on your current medications as directed. Please refer to the Current Medication list given to you today.  *If you need a refill on your cardiac medications before your next appointment, please call your pharmacy*  Lab Work: NONE ordered at this time of appointment   If you have labs (blood work) drawn today and your tests are completely normal, you will receive your results only by: [place] (if you have MyChart) OR A paper copy in the mail If you have any lab test that is abnormal or we need to change your treatment, we will call you to review the results.  Testing/Procedures: NONE ordered at this time of appointment   Follow-Up: At [facility], you and your health needs are our priority.  As part of our continuing mission to provide you with exceptional heart care, we have created designated Provider Care Teams.  These Care Teams include your primary Cardiologist (physician) and Advanced Practice Providers (APPs -  Physician Assistants and Nurse Practitioners) who all work together to provide you with the care you need, when you need it.  Your next appointment:   1 year(s)  Provider:   Dr. [facility]     Other Instructions

## 2022-10-20 NOTE — Progress Notes (Signed)
Virtual Visit via Video Note   Because of Grant Young's co-morbid illnesses, he is at least at moderate risk for complications without adequate follow up.  This format is felt to be most appropriate for this patient at this time.  All issues noted in this document were discussed and addressed.  A limited physical exam was performed with this format.  Please refer to the patient's chart for his consent to telehealth for [facility].      Date:  10/20/2022   ID:  Grant Young, DOB December 04, 1945, MRN [id number] The patient was identified using 2 identifiers.  Evaluation Performed:  Follow-Up Visit  Patient Location:  [place] [id number]  Provider location:   [address] [place], [place] [address]  PCP:  [name], NP  Cardiologist:  Kate Sable, MD Electrophysiologist:  None   Chief Complaint:  Abdominal pain  History of Present Illness:    Grant Young is a 77 y.o. male who presents via audio/video conferencing for a telehealth visit today.  This is a pleasant 77 year old male with a history of coronary artery disease and recent PCI x2 in March 2023. He has been on some statin therapy but has had side effects, particularly on simvastatin and more recently on atorvastatin. He has been able to take Zetia and remains on this. He also has a long list of other medication intolerances. Recent labs showed total cholesterol of 160, triglycerides 74, HDL 47 and LDL 99 however he was advised to stop atorvastatin after these labs due to fatigue and other intolerances. He was referred for evaluation of possible PCSK9 inhibitor therapy.   10/20/2022  Grant Young was seen today via telehealth for follow-up.  He has had a significant improvement in his lipids on Repatha.  Recently he had been having some abdominal pain.  This is an unusual tensional side effect of Repatha however I advised a 2-week holiday off the medicine to see if it improved.   He reported today it has not.  His PCP is also done a number of blood tests which have been unrevealing.  He apparently has a GI doctor and he was going to follow-up with them.  He also has a pain specialist as well.  His repeat labs showed marked improvement in cholesterol.  Total now 105, triglycerides 110, HDL 49 and LDL of 34.  His goal LDL is less than 70.  Prior CV studies:   The following studies were reviewed today:  Labs  PMHx:  Past Medical History:  Diagnosis Date   Anxiety    Per New Patient Packet   Chronic back pain    Per New Patient Packet   Chronic heart disease    Per New Patient Packet   Depression    GERD (gastroesophageal reflux disease)    Headache    History of gastritis    Per New Patient Packet   History of neuropathy    Per New Patient Packet   Hypertension    Per New Patient Packet   Insomnia    Kidney stone    Lumbar radicular pain    Per New Patient Packet   Lyme disease    Per New Patient Packet   Myocardial infarction [facility])    Overactive bladder    PONV (postoperative nausea and vomiting)    Skin cancer, basal cell    Spinal cord stimulator status    01/08/21 - not currently using.  Squamous cell skin cancer    Substance abuse [facility])     Past Surgical History:  Procedure Laterality Date   BACK SURGERY     CARDIAC CATHETERIZATION     CATARACT EXTRACTION W/PHACO Left 01/14/2021   Procedure: CATARACT EXTRACTION PHACO AND INTRAOCULAR LENS PLACEMENT (IOC) LEFT VIVITY TORIC LENS 8.75 00:56.7;  Surgeon: Birder Robson, MD;  Location: [place] [facility];  Service: Ophthalmology;  Laterality: Left;   CATARACT EXTRACTION W/PHACO Right 01/28/2021   Procedure: CATARACT EXTRACTION PHACO AND INTRAOCULAR LENS PLACEMENT ([place]) RIGHT VIVITY TORIC LENS;  Surgeon: Birder Robson, MD;  Location: [facility];  Service: Ophthalmology;  Laterality: Right;  6.54 00:46.4   CHOLECYSTECTOMY  2017   COLONOSCOPY  09/15/2015   Per New Patient Packet    COLONOSCOPY WITH PROPOFOL N/A 10/03/2020   Procedure: COLONOSCOPY WITH PROPOFOL;  Surgeon: [name], MD;  Location: [facility] ENDOSCOPY;  Service: Gastroenterology;  Laterality: N/A;   CORONARY/GRAFT ACUTE MI REVASCULARIZATION N/A 11/27/2021   Procedure: Coronary/Graft Acute MI Revascularization;  Surgeon: Leonie Man, MD;  Location: [place] [facility];  Service: Cardiovascular;  Laterality: N/A;   ESOPHAGOGASTRODUODENOSCOPY (EGD) WITH PROPOFOL N/A 10/03/2020   Procedure: ESOPHAGOGASTRODUODENOSCOPY (EGD) WITH PROPOFOL;  Surgeon: [name], MD;  Location: [facility] ENDOSCOPY;  Service: Gastroenterology;  Laterality: N/A;   FOOT SURGERY Bilateral 03/18/2020   FOOT SURGERY  08/032021   GALLBLADDER SURGERY  09/15/2015   Gallbladder Removal. Procedure done by Dr.Beverly. Per New Patient Packet   KIDNEY STONE SURGERY  09/14/1980   Too many to count. Per New Patient Packet 09/14/1980-09/15/1995   KIDNEY STONE SURGERY  09/15/1995   Too many to count. Per New Patient Packet   LEFT HEART CATH AND CORONARY ANGIOGRAPHY N/A 11/27/2021   Procedure: LEFT HEART CATH AND CORONARY ANGIOGRAPHY;  Surgeon: Leonie Man, MD;  Location: [place] [facility];  Service: Cardiovascular;  Laterality: N/A;   LITHOTRIPSY  09/14/2014   Per New Patient Packet   LITHOTRIPSY  09/14/1996   No Stints Used. Per New Patient Packet   LITHOTRIPSY  09/14/1997   No Stints used. Per New Patient Packet   PAIN PUMP IMPLANTATION     PAIN PUMP REMOVAL     SHOULDER SURGERY Right 1984   SIGMOIDOSCOPY  09/14/2017   Per New Patient Packet   SPINAL CORD STIMULATOR IMPLANT     TONSILLECTOMY     UPPER GI ENDOSCOPY      FAMHx:  Family History  Problem Relation Age of Onset   Heart disease Father    Heart failure Father    Hypertension Father    Stroke Father    Stroke Mother    Dementia Mother    Kidney Stones Daughter    Anxiety disorder Daughter    OCD Daughter     SOCHx:   reports that he has never smoked. He has  never been exposed to tobacco smoke. He has never used smokeless tobacco. He reports current alcohol use. He reports that he does not currently use drugs.  ALLERGIES:  Allergies  Allergen Reactions   Ace Inhibitors     Other reaction(s): Cough   Fluoxetine Anxiety    "made me fall asleep" per pt "bad headaches and "makes  Me  Crazy" historical allergy noted in [employer] "made me fall asleep" per pt "bad headaches and "makes  Me  Crazy" Per New Patient Packet.     Metoclopramide     Other reaction(s): Other (See Comments), Other (See Comments), Unknown Tardive Dyskinesia  historical allergy noted in [employer] Tardive Dyskinesia  Per New Patient Packet.   Nalbuphine     Used Post Back surgery- Anesthesiologist Error. Patient had Narcotic Withdraw. Per New Patient Packet.    Other     Other reaction(s): Other (See Comments) Altered mental status in combo with narcotics at previous hospitalization - Full Withdrawal Symptoms Other reaction(s): Rash   Amoxicillin-Pot Clavulanate Nausea Only    Per New Patient Packet.   Doxazosin Rash    Other reaction(s): Other - See Comments, Rash UNKNOWN REACTION UNKNOWN REACTION    Duloxetine Nausea Only    Per New Patient Packet.    Penicillins Nausea Only    Per New Patient Packet.   Tamsulosin Itching and Anxiety    Restless, Flushing, Heavy Chest, Itching, Hyperactive mood and Anxiety. Unable to handle side effects. Per New Patient Packet.     Trazodone And Nefazodone Itching, Anxiety and Rash    Headache. "INCREASED MY ANXIETY AND HEARTRATE" Flushing, tachycardia "INCREASED MY ANXIETY AND HEARTRATE" Per New Patient Packet.    Amlodipine     Shaking, unsure of reaction. Per New Patient Packet.     Cinoxacin     GI Intolerance, and Dizziness. Per New Patient Packet.    Ciprofloxacin     Other reaction(s): Unknown   Fludrocortisone Other (See Comments)    "Worsening headaches, GI issues, fatigue"   Nebivolol     Other  reaction(s): Unknown   Olanzapine     Headache and unable to sleep for 3 nights. Per New Patient Packet.    Olmesartan     Other reaction(s): Unknown   Pregabalin     Confusion, Lack of concentration, dizziness, and likely drowsiness. Per New Patient Packet.     Prostaglandins     Other reaction(s): Other (See Comments) Intolerance   Thyroid Hormones     Other reaction(s): Other (See Comments) Thyroid (Nature Thyroid) contraindicated with some of your other medications.   Zolpidem     Nightmares, Ineffective after 2 days. Per New Patient Packet.    Duloxetine Hcl     Other reaction(s): Rash   Fluoxetine Hcl     Other reaction(s): Rash   Nucynta [Tapentadol] Other (See Comments)    Vertigo    Phenytoin Anxiety    Hyperactivity, and Ineffective. Per New Patient Packet.     MEDS:  Current Meds  Medication Sig   anastrozole (ARIMIDEX) 1 MG tablet 1/2 tablet (0.5 mg) by mouth once weekly   aspirin 81 MG chewable tablet Chew 1 tablet (81 mg total) by mouth daily.   Atogepant 60 MG TABS Take 60 mg by mouth once daily   butalbital-acetaminophen-caffeine (FIORICET) 50-325-40 MG tablet TAKE 1 TABLET BY MOUTH EVERY 6 HOURS AS NEEDED FOR HEADACHE.   cetirizine (ZYRTEC) 10 MG tablet Take 10 mg by mouth daily as needed for allergies.   Cholecalciferol 50 MCG (2000 UT) CAPS Take 1 tablet by mouth daily.   clopidogrel (PLAVIX) 75 MG tablet Take 1 tablet (75 mg) by mouth once daily   Cyanocobalamin (VITAMIN B-12) 5000 MCG LOZG Take 1 lozenge by mouth daily.    DHEA 25 MG CAPS Take 25 mg by mouth daily.   diazepam (VALIUM) 5 MG tablet TAKE 1 TABLET IN THE MORNING AND 2 TABLETS AT BEDTIME   dicyclomine (BENTYL) 10 MG capsule Take 1 capsule (10 mg total) by mouth 4 (four) times daily -  before meals and at bedtime.   Digestive Aids Mixture (DIGESTION GB PO) Take  1 capsule by mouth daily.   escitalopram (LEXAPRO) 20 MG tablet TAKE 1 TABLET BY MOUTH EVERY DAY   Evolocumab (REPATHA SURECLICK)  440 MG/ML SOAJ Inject 140 mg into the skin every 14 (fourteen) days.   ezetimibe (ZETIA) 10 MG tablet Take 1 tablet (10 mg total) by mouth daily.   ferrous sulfate 325 (65 FE) MG tablet Take 325 mg by mouth. Once daily on Mondays, Wednesdays and Fridays   HYDROcodone-acetaminophen (NORCO) 10-325 MG tablet Take 1 tablet by mouth every 8 (eight) hours as needed for severe pain. Must last 30 days.   [START ON 10/21/2022] HYDROcodone-acetaminophen (NORCO) 10-325 MG tablet Take 1 tablet by mouth every 8 (eight) hours as needed for severe pain. Must last 30 days.   ipratropium (ATROVENT) 0.06 % nasal spray Place 1 spray into both nostrils 4 (four) times daily as needed for rhinitis. For up to 5-7 days then stop.   Loperamide HCl (IMODIUM PO) Take by mouth as needed.    MAGNESIUM BISGLYCINATE PO Take by mouth daily.   melatonin 5 MG TABS Take 5 mg by mouth at bedtime.   NONFORMULARY OR COMPOUNDED ITEM Bi-Mix Papaverine '30mg'$ , Phentolamine '1mg'$    Dosage: Inject 1cc per injection    Vial 85m   Qty #5 Refills 6   nortriptyline (PAMELOR) 10 MG capsule Take 10 mg in the morning and 30 mg at night and continue that dose   nortriptyline (PAMELOR) 25 MG capsule Take 1 capsule (25 mg total) by mouth at bedtime.   Nutritional Supplements (NUTRITIONAL SUPPLEMENT PO) Take 1 capsule by mouth daily. [name]   polyethylene glycol (MIRALAX / GLYCOLAX) 17 g packet Take 17 g by mouth daily as needed.   POTASSIUM PO Take by mouth. Takes 5x weekly.   Probiotic Product (PROBIOTIC DAILY PO) Take by mouth daily.   prochlorperazine (COMPAZINE) 10 MG tablet Take 1 tablet (10 mg total) by mouth 2 (two) times daily as needed for nausea or vomiting.   propranolol ER (INDERAL LA) 80 MG 24 hr capsule Take 1 capsule (80 mg total) by mouth 2 (two) times daily.   pyridostigmine (MESTINON) 60 MG tablet Take 0.5 tablets (30 mg total) by mouth 2 (two) times daily.   RABEprazole (ACIPHEX) 20 MG tablet TAKE 1 TABLET BY  MOUTH TWICE A DAY   sucralfate (CARAFATE) 1 g tablet Take 1 g by mouth 4 (four) times daily as needed.    testosterone cypionate (DEPOTESTOSTERONE CYPIONATE) 200 MG/ML injection Inject 80 mg into the muscle every 14 (fourteen) days.   valACYclovir (VALTREX) 1000 MG tablet Take 2 tabs p.o. and repeat in 12 hours as needed for cold sore     ROS: Pertinent items noted in HPI and remainder of comprehensive ROS otherwise negative.  Labs/Other Tests and Data Reviewed:    Recent Labs: 04/19/2022: B Natriuretic Peptide 149.5 08/20/2022: Magnesium 2.0 09/18/2022: BUN 11; Creat 0.76; Hemoglobin 16.1; Platelets 264; Potassium 4.9; Sodium 143 09/24/2022: ALT 14   Recent Lipid Panel Lab Results  Component Value Date/Time   CHOL 105 09/24/2022 02:45 PM   TRIG 110 09/24/2022 02:45 PM   HDL 49 09/24/2022 02:45 PM   CHOLHDL 2.1 09/24/2022 02:45 PM   LDLCALC 34 09/24/2022 02:45 PM   LDLCALC 103 (H) 11/11/2021 10:06 AM   LDLDIRECT 98.5 01/30/2022 03:35 PM    Wt Readings from Last 3 Encounters:  10/20/22 139 lb (63 kg)  09/29/22 137 lb (62.1 kg)  09/18/22 137 lb (62.1 kg)  Exam:    Vital Signs:  BP (!) 141/81   Pulse 64   Ht '5\' 9"'$  (1.753 m)   Wt 139 lb (63 kg)   BMI 20.53 kg/m    General appearance: alert and no distress Lungs: No visual respiratory difficulty Abdomen: Normal weight Extremities: extremities normal, atraumatic, no cyanosis or edema Neurologic: Grossly normal  ASSESSMENT & PLAN:    Coronary artery disease with recent PCI Mixed dyslipidemia, goal LDL less than 70 Statin intolerant  Grant Young is doing well on Repatha.  He has had marked improvement in his lipids.  LDL now in the 30s.  He has been able to continue to take ezetimibe.  He has had some abdominal discomfort but this was not present when he started on the ezetimibe.  He is also been having some back pain.  He is followed by pain specialist.  He had some workup for his abdominal pain by his PCP.  I had him hold  the Repatha and he noted no significant improvement.  I advised him to restart the Repatha every 2 weeks.  Will plan follow-up in 1 year with repeat lipids.  Patient Risk:   After full review of this patients clinical status, I feel that they are at least moderate risk at this time.  Time:   Today, I have spent 15 minutes with the patient with telehealth technology discussing dyslipidemia.     Medication Adjustments/Labs and Tests Ordered: Current medicines are reviewed at length with the patient today.  Concerns regarding medicines are outlined above.   Tests Ordered: No orders of the defined types were placed in this encounter.   Medication Changes: No orders of the defined types were placed in this encounter.   Disposition:  in 1 year(s)  Pixie Casino, MD, [facility], [place] Director of the [facility] Lipid Disorders &  Cardiovascular Risk Reduction Clinic Diplomate of the American Board of Clinical Lipidology Attending Cardiologist  Direct Dial: [phone]  Fax: [phone]  Website:  [email or web address]  Pixie Casino, MD  10/20/2022 9:54 AM

## 2022-10-26 ENCOUNTER — Ambulatory Visit: Payer: Medicare Other | Admitting: Pharmacist

## 2022-10-26 DIAGNOSIS — I10 Essential (primary) hypertension: Secondary | ICD-10-CM

## 2022-10-26 DIAGNOSIS — I25110 Atherosclerotic heart disease of native coronary artery with unstable angina pectoris: Secondary | ICD-10-CM

## 2022-10-26 NOTE — Patient Instructions (Signed)
Goals Addressed             This Visit's Progress    Pharmacy Goals       Please check your home blood pressure, keep a log of the results and bring this with you to your medical appointments. Please call office sooner for readings outside of established parameters or symptoms.   Feel free to call me with any questions or concerns. I look forward to our next call!  Wallace Cullens, PharmD, [place] [phone]

## 2022-10-26 NOTE — Progress Notes (Signed)
10/26/2022 Name: Grant Young MRN: [id number] DOB: [birth year]  Chief Complaint  Patient presents with   Medication Management    Grant Young is a 77 y.o. year old male who presented for a telephone visit.   They were referred to the pharmacist by their PCP for assistance in managing hypertension and complex medication management.    Subjective:  Care Team: Primary Care Provider: [name], NP ; Next Scheduled Visit: 11/20/2022 Cardiologist: Leonie Man, MD; Next Scheduled Visit: 11/02/2022 Neurologist: Rufina Falco, [place]; Next Scheduled Visit: 12/10/2022 Pain Management: [name], MD; Next Scheduled Visit: 10/28/2022 Cardiology-Lipid [facility] Specialist: Pixie Casino, MD Urologist: Billey Co, MD; Next Schedule Visit: 05/19/2023  Medication Access/Adherence  Current Pharmacy:  [facility], [place] [address] Phone: [phone] Fax: [phone]  [id number] [place] [address]: [phone]: [phone]  Patient reports affordability concerns with their medications: No  Patient reports access/transportation concerns to their pharmacy: No  Patient reports adherence concerns with their medications:  No     Hypertension:  Current medications:  Propranolol ER 80 mg twice daily (increased by PCP on 10/19/2022) Pyridostigmine 60 mg - 1/2 tablet (30 mg) twice daily (prescribed by PCP related to chronic orthostatic hypotension)  Medications previously tried: labetalol, amlodipine, olmesartan, chlorthalidone    Note higher propranolol does recommended by Neurology as an option for helping to manage essential tremor  Patient has an automated, upper arm home BP cuff Reports has only checked home blood pressure once since started new dose of propranolol:  yesterday, reading: 160/90, HR 60 *Reports that he was in significant pain at the time of this reading  Has attributed some variation in readings both at home and vs in office to stress/being in office (being asked questions/busy environment)   Current physical activity: working with physical therapy twice/week and reports noticing improvement in his conditioning   Hyperlipidemia/ASCVD Risk Reduction/CAD  [facility]  Current lipid lowering medications:  - ezetimibe 10 mg daily - Reports currently not taking Repatha 140 mg every 14 days (stopped related to abdominal pain)  Medications tried in the past: simvastatin, atorvastatin  From review of chart, note patient contacted Dr. HDebara Picketton 09/26/2022 regarding concern that he might be having side effects from [place] Cardiologist advised patient to skip his next dose of Repatha to see if his symptoms would resolve. Patient completed Virtual Visit with Cardiologist on 2/6. Patient advised that his abdominal pain had not improved while off of Repatha. Provider recommended patient restart Repatha  Today patient advises that he still has not restarted Repatha.  Antiplatelet regimen: aspirin 81 mg daily; clopidogrel 75 mg daily   Current physical activity: working with physical therapy twice/week and reports noticing improvement in his conditioning   Objective:  Lab Results  Component Value Date   HGBA1C 5.1 10/17/2019    Lab Results  Component Value Date   CREATININE 0.76 09/18/2022   BUN 11 09/18/2022   NA 143 09/18/2022   K 4.9 09/18/2022   CL 104 09/18/2022   CO2 32 09/18/2022    Lab Results  Component Value Date   CHOL 105 09/24/2022   HDL 49 09/24/2022   LDLCALC 34 09/24/2022   LDLDIRECT 98.5 01/30/2022   TRIG 110 09/24/2022   CHOLHDL 2.1  09/24/2022   BP Readings from Last 3 Encounters:  10/20/22 (!) 141/81  09/29/22 (!) 175/93  09/18/22 134/82   Pulse Readings from Last 3  Encounters:  10/20/22 64  09/29/22 66  09/18/22 63     Medications Reviewed Today     Reviewed by Rennis Petty, RPH-CPP (Pharmacist) on 10/26/22 at 1613  Med List Status: <None>   Medication Order Taking? Sig Documenting Provider Last Dose Status Informant  anastrozole (ARIMIDEX) 1 MG tablet [id number]  1/2 tablet (0.5 mg) by mouth once weekly [provider]  Active Spouse/Significant Other, Self  aspirin 81 MG chewable tablet [id number]  Chew 1 tablet (81 mg total) by mouth daily. Val Riles, MD  Active Self, Spouse/Significant Other  butalbital-acetaminophen-caffeine (FIORICET) 50-325-40 MG tablet [id number]  TAKE 1 TABLET BY MOUTH EVERY 6 HOURS AS NEEDED FOR HEADACHE. [name], NP  Active Self, Spouse/Significant Other  cetirizine (ZYRTEC) 10 MG tablet [id number]  Take 10 mg by mouth daily as needed for allergies. [provider]  Active Spouse/Significant Other, Self  Cholecalciferol 50 MCG (2000 UT) CAPS [id number]  Take 1 tablet by mouth daily. [provider]  Active Spouse/Significant Other, Self  clopidogrel (PLAVIX) 75 MG tablet [id number]  Take 1 tablet (75 mg) by mouth once daily Leonie Man, MD  Active Self, Spouse/Significant Other  Cyanocobalamin (VITAMIN B-12) 5000 MCG LOZG [id number]  Take 1 lozenge by mouth daily.  [provider]  Active Spouse/Significant Other, Self  DHEA 25 MG CAPS [id number]  Take 25 mg by mouth daily. [provider]  Active Spouse/Significant Other, Self  diazepam (VALIUM) 5 MG tablet [id number]  TAKE 1 TABLET IN THE MORNING AND 2 TABLETS AT BEDTIME [name], NP  Active   dicyclomine (BENTYL) 10 MG capsule [id number]  Take 1 capsule (10 mg total) by mouth 4 (four) times daily -  before meals and at bedtime. [name], NP  Active Self, Spouse/Significant Other           Med Note Britt Bottom   Wed Aug 26, 2022  2:32 PM) PRN  Digestive Aids Mixture (DIGESTION GB PO) [id number]   Take 1 capsule by mouth daily. [provider]  Active Spouse/Significant Other, Self  escitalopram (LEXAPRO) 20 MG tablet [id number]  TAKE 1 TABLET BY MOUTH EVERY DAY [name], [name], NP  Active   Evolocumab (REPATHA SURECLICK) [id number] MG/ML SOAJ [id number]  Inject 140 mg into the skin every 14 (fourteen) days. Pixie Casino, MD  Active Self, Spouse/Significant Other  ezetimibe (ZETIA) 10 MG tablet [id number]  Take 1 tablet (10 mg total) by mouth daily. [name], NP  Active Self, Spouse/Significant Other  ferrous sulfate 325 (65 FE) MG tablet [id number] Yes Take 325 mg by mouth. Once daily on Mondays, Wednesdays and Fridays [provider] Taking Active Self, Spouse/Significant Other  HYDROcodone-acetaminophen (NORCO) 10-325 MG tablet [id number]  Take 1 tablet by mouth every 8 (eight) hours as needed for severe pain. Must last 30 days. [name], MD  Active Self, Spouse/Significant Other  ipratropium (ATROVENT) 0.06 % nasal spray [id number]  Place 1 spray into both nostrils 4 (four) times daily as needed for rhinitis. For up to 5-7 days then stop. Olin Hauser, DO  Active Self, Spouse/Significant Other  Loperamide HCl (IMODIUM PO) [id number]  Take by mouth as needed.  [provider]  Active Spouse/Significant Other, Self  MAGNESIUM BISGLYCINATE PO [id number]  Take by mouth daily. [provider]  Active Spouse/Significant Other, Self  melatonin 5 MG TABS [id number]  Take 5 mg by mouth at bedtime. [provider]  Active Spouse/Significant Other, Self  NONFORMULARY OR COMPOUNDED ITEM [id number]  Bi-Mix Papaverine 9m, Phentolamine 166m  Dosage: Inject 1cc per injection    Vial 37m937m Qty #5 Refills 6 SniBilley CoD  Active Self, Spouse/Significant Other  nortriptyline (PAMELOR) 10 MG capsule [id number] Take 10 mg in the morning and 30 mg at night and continue that dose [provider] Taking Active   Nutritional  Supplements (NUTRITIONAL SUPPLEMENT PO) [id number] 1 capsule by mouth daily. [name] [provider]  Active Spouse/Significant Other, Self  polyethylene glycol (MIRALAX / GLYCOLAX) 17 g packet [id number] 17 g by mouth daily as needed. [provider]  Active Spouse/Significant Other, Self  POTASSIUM PO [id number] by mouth. Takes 5x weekly. [provider]  Active   Probiotic Product (PROBIOTIC DAILY PO) [id number] by mouth daily. [provider]  Active Spouse/Significant Other, Self  prochlorperazine (COMPAZINE) 10 MG tablet [id number] 1 tablet (10 mg total) by mouth 2 (two) times daily as needed for nausea or vomiting. BaiJearld FentonP  Active Self, Spouse/Significant Other  propranolol ER (INDERAL LA) 80 MG 24 hr capsule [id number] 1 capsule (80 mg total) by mouth 2 (two) times daily. BaiJearld FentonP  Active   pyridostigmine (MESTINON) 60 MG tablet [id number] Take 0.5 tablets (30 mg total) by mouth 2 (two) times daily. BaiJearld FentonP Taking Active Self, Spouse/Significant Other  RABEprazole (ACIPHEX) 20 MG tablet [id number] 1 TABLET BY MOUTH TWICE A DAY [name], RegCoralie KeensP  Active Self, Spouse/Significant Other  sucralfate (CARAFATE) 1 g tablet [id number] 1 g by mouth 4 (four) times daily as needed.  [provider]  Active Spouse/Significant Other, Self  testosterone cypionate (DEPOTESTOSTERONE CYPIONATE) 200 MG/ML injection [id number] 80 mg into the muscle every 14 (fourteen) days. [provider]  Active Spouse/Significant Other, Self  valACYclovir (VALTREX) 1000 MG tablet [id number] 2 tabs p.o. and repeat in 12 hours as needed for cold sore [name], RegCoralie KeensP  Active Spouse/Significant Other, Self  Med List Note (TiDewayne ShorterN 07/30/22 1102): MR 11-20-2022 UDS 09-17-20              Assessment/Plan:   Hypertension: - Have reviewed appropriate blood  pressure monitoring technique and reviewed goal blood pressure.  - Have reviewed to check blood pressure, document, and provide at next provider visit. Patient to contact PCP or Cardiology sooner if readings outside of established parameters   - Have discussed dietary modifications, such as reduced salt intake, focus on whole grains, vegetables, lean proteins - Recommend patient follow up with Neurology regarding concern about tremor Encourage patient to keep log of factors, including medication use and caffeine intake that may impact tremor   Hyperlipidemia/ASCVD Risk Reduction: - Patient advises that if his abdominal symptoms have not improved in 1 week, plans to restart Repatha as directed by Cardiologist at that time - Encourage patient to consider contacting his GI Specialist to schedule a follow up appointment regarding GI symptoms   Follow Up Plan: Patient denies further medication questions or concerns today Provide patient with contact information for clinic pharmacist to contact if needed in future for medication questions/concerns     EliWallace CullensharmD, BCAClara Barton Hospitalinical Pharmacist  [facility] [phone]

## 2022-10-28 ENCOUNTER — Ambulatory Visit
Payer: Worker's Compensation | Attending: Student in an Organized Health Care Education/Training Program | Admitting: Student in an Organized Health Care Education/Training Program

## 2022-10-28 ENCOUNTER — Encounter: Payer: Self-pay | Admitting: Student in an Organized Health Care Education/Training Program

## 2022-10-28 ENCOUNTER — Ambulatory Visit
Admission: RE | Admit: 2022-10-28 | Discharge: 2022-10-28 | Disposition: A | Discharge status: Home / Self Care | Payer: Medicare Other | Source: Ambulatory Visit | Attending: Student in an Organized Health Care Education/Training Program | Admitting: Student in an Organized Health Care Education/Training Program

## 2022-10-28 VITALS — BP 210/107 | HR 67 | Temp 97.2°F | Resp 14 | Ht 69.0 in | Wt 139.0 lb

## 2022-10-28 DIAGNOSIS — M461 Sacroiliitis, not elsewhere classified: Secondary | ICD-10-CM | POA: Insufficient documentation

## 2022-10-28 DIAGNOSIS — M47816 Spondylosis without myelopathy or radiculopathy, lumbar region: Secondary | ICD-10-CM

## 2022-10-28 DIAGNOSIS — M545 Low back pain, unspecified: Secondary | ICD-10-CM | POA: Diagnosis present

## 2022-10-28 DIAGNOSIS — G5703 Lesion of sciatic nerve, bilateral lower limbs: Secondary | ICD-10-CM | POA: Diagnosis not present

## 2022-10-28 DIAGNOSIS — M533 Sacrococcygeal disorders, not elsewhere classified: Secondary | ICD-10-CM | POA: Diagnosis not present

## 2022-10-28 DIAGNOSIS — G894 Chronic pain syndrome: Secondary | ICD-10-CM | POA: Insufficient documentation

## 2022-10-28 DIAGNOSIS — M47896 Other spondylosis, lumbar region: Secondary | ICD-10-CM | POA: Diagnosis not present

## 2022-10-28 MED ORDER — LIDOCAINE HCL 2 % IJ SOLN
20.0000 mL | Freq: Once | INTRAMUSCULAR | Status: AC
  Administered 2022-10-28: 400 mg

## 2022-10-28 MED ORDER — ROPIVACAINE HCL 2 MG/ML IJ SOLN
INTRAMUSCULAR | Status: AC
  Filled 2022-10-28: qty 20

## 2022-10-28 MED ORDER — DEXAMETHASONE SODIUM PHOSPHATE 10 MG/ML IJ SOLN
INTRAMUSCULAR | Status: AC
  Filled 2022-10-28: qty 2

## 2022-10-28 MED ORDER — DEXAMETHASONE SODIUM PHOSPHATE 10 MG/ML IJ SOLN
10.0000 mg | Freq: Once | INTRAMUSCULAR | Status: AC
  Administered 2022-10-28: 10 mg

## 2022-10-28 MED ORDER — ROPIVACAINE HCL 2 MG/ML IJ SOLN
9.0000 mL | Freq: Once | INTRAMUSCULAR | Status: AC
  Administered 2022-10-28: 9 mL via INTRA_ARTICULAR

## 2022-10-28 MED ORDER — METHYLPREDNISOLONE ACETATE 40 MG/ML IJ SUSP
40.0000 mg | Freq: Once | INTRAMUSCULAR | Status: AC
  Administered 2022-10-28: 40 mg via INTRA_ARTICULAR

## 2022-10-28 MED ORDER — METHYLPREDNISOLONE ACETATE 40 MG/ML IJ SUSP
INTRAMUSCULAR | Status: AC
  Filled 2022-10-28: qty 1

## 2022-10-28 MED ORDER — LIDOCAINE HCL 2 % IJ SOLN
INTRAMUSCULAR | Status: AC
  Filled 2022-10-28: qty 20

## 2022-10-28 MED ORDER — IOHEXOL 180 MG/ML  SOLN
10.0000 mL | Freq: Once | INTRAMUSCULAR | Status: AC
  Administered 2022-10-28: 10 mL via INTRA_ARTICULAR
  Filled 2022-10-28: qty 20

## 2022-10-28 MED ORDER — ROPIVACAINE HCL 2 MG/ML IJ SOLN
9.0000 mL | Freq: Once | INTRAMUSCULAR | Status: AC
  Administered 2022-10-28: 9 mL via PERINEURAL

## 2022-10-28 NOTE — Progress Notes (Signed)
PROVIDER NOTE: Interpretation of information contained herein should be left to medically-trained personnel. Specific patient instructions are provided elsewhere under "Patient Instructions" section of medical record. This document was created in part using STT-dictation technology, any transcriptional errors that may result from this process are unintentional.  Patient: Grant Young Type: Established DOB: 1946-05-14 MRN: [id number] PCP: [name], NP  Service: Procedure DOS: 10/28/2022 Setting: Ambulatory Location: Ambulatory outpatient facility Delivery: Face-to-face Provider: [name], MD Specialty: Interventional Pain Management Specialty designation: 09 Location: Outpatient facility Ref. Prov.: [name], MD       Interventional Therapy   Procedure: Lumbar Facet, Medial Branch Block(s) #2    Laterality: Bilateral  Level(s): L3, L4, and L5 Medial Branch Level(s).  Rationale: Injecting these levels should provide temporary sensory nerve conduction blockade of the L3-4 and L4-5 lumbar facet (zygapophyseal) joints. Target: Terminal medial branch nerve division of lumbosacral nerve root dorsal rami.  Procedure No.2: Sacroiliac joint injection #1    Laterality: Bilateral     Approach: Inferior postero-medial percutaneous approach. Level: PIIS (Posterior Inferior Iliac Spine) Sacroiliac Joint Target: For lower sacroiliac joint block(s), the target is the inferior and posterior margin of the sacroiliac joint.  Imaging: Fluoroscopic guidance         Anesthesia: Local anesthesia (1-2% Lidocaine) DOS: 10/28/2022 Performed by: [name], MD  Patient stopped Plavix 7 days prior  Purpose: Diagnostic/Therapeutic Indications: Low back pain severe enough to impact quality of life or function. Medical necessity rationale: procedure needed and proper for the diagnosis and/or treatment of the patient's medical symptoms and needs. 1. SI (sacroiliac) joint dysfunction   2.  Lumbar facet arthropathy   3. Lumbar spondylosis   4. Chronic pain syndrome   5. Piriformis syndrome of both sides    NAS-11 Pain score:   Pre-procedure: 7 /10   Post-procedure: 0-No pain/10     Position / Prep / Materials:  Position: Prone  Prep solution: DuraPrep (Iodine Povacrylex [0.7% available iodine] and Isopropyl Alcohol, 74% w/w) Area Prepped: Posterolateral Lumbosacral Spine (Wide prep: From the lower border of the scapula down to the end of the tailbone and from flank to flank.)  Materials for procedure No.1:  Tray: Block Needle(s):  Type: Spinal  Gauge (G): 22  Length: 3.5-in Qty: 2     Materials for procedure No.2:  Tray: Block Needle(s):  Type: Spinal  Gauge (G): 22  Length: 3.5-in Qty: 2      Pre-op H&P Assessment:  Grant Young is a 77 y.o. (year old), male patient, seen today for interventional treatment. He  has a past surgical history that includes Kidney stone surgery (09/14/1980); Kidney stone surgery (09/15/1995); Lithotripsy (09/14/2014); Lithotripsy (09/14/1996); Lithotripsy (09/14/1997); Gallbladder surgery (09/15/2015); Sigmoidoscopy (09/14/2017); Colonoscopy (09/15/2015); Cholecystectomy (2017); Shoulder surgery (Right, 1984); Back surgery; Spinal cord stimulator implant; Pain pump implantation; Pain pump removal; Tonsillectomy; Foot surgery (Bilateral, 03/18/2020); Foot surgery (08/032021); Upper gi endoscopy; Cardiac catheterization; Colonoscopy with propofol (N/A, 10/03/2020); Esophagogastroduodenoscopy (egd) with propofol (N/A, 10/03/2020); Cataract extraction w/PHACO (Left, 01/14/2021); Cataract extraction w/PHACO (Right, 01/28/2021); Coronary/Graft Acute MI Revascularization (N/A, 11/27/2021); and LEFT HEART CATH AND CORONARY ANGIOGRAPHY (N/A, 11/27/2021). Grant Young has a current medication list which includes the following prescription(s): anastrozole, aspirin, butalbital-acetaminophen-caffeine, cetirizine, cholecalciferol, clopidogrel, vitamin b-12, dhea,  diazepam, dicyclomine, digestive aids mixture, escitalopram, repatha sureclick, ezetimibe, ferrous sulfate, hydrocodone-acetaminophen, ipratropium, loperamide hcl, magnesium bisglycinate, melatonin, NONFORMULARY OR COMPOUNDED ITEM, nortriptyline, nutritional supplements, polyethylene glycol, potassium, probiotic product, prochlorperazine, propranolol er, pyridostigmine, rabeprazole, sucralfate, testosterone cypionate, and valacyclovir. His primarily  concern today is the Back Pain  Initial Vital Signs:  Pulse/HCG Rate: 67ECG Heart Rate: 61 Temp: (!) 97.2 F (36.2 C) Resp: 16 BP: (!) 192/96 (states he has white coat syndrome) SpO2: 100 %  BMI: Estimated body mass index is 20.53 kg/m as calculated from the following:   Height as of this encounter: 5' 9"$  (1.753 m).   Weight as of this encounter: 139 lb (63 kg).  Risk Assessment: Allergies: Reviewed. He is allergic to ace inhibitors, fluoxetine, metoclopramide, nalbuphine, other, amoxicillin-pot clavulanate, doxazosin, duloxetine, penicillins, tamsulosin, trazodone and nefazodone, amlodipine, cinoxacin, ciprofloxacin, fludrocortisone, nebivolol, olanzapine, olmesartan, pregabalin, prostaglandins, thyroid hormones, zolpidem, duloxetine hcl, fluoxetine hcl, nucynta [tapentadol], and phenytoin.  Allergy Precautions: None required Coagulopathies: Reviewed. None identified.  Blood-thinner therapy: None at this time Active Infection(s): Reviewed. None identified. Grant Young is afebrile  Site Confirmation: Grant Young was asked to confirm the procedure and laterality before marking the site Procedure checklist: Completed Consent: Before the procedure and under the influence of no sedative(s), amnesic(s), or anxiolytics, the patient was informed of the treatment options, risks and possible complications. To fulfill our ethical and legal obligations, as recommended by the American Medical Association's Code of Ethics, I have informed the patient of my clinical  impression; the nature and purpose of the treatment or procedure; the risks, benefits, and possible complications of the intervention; the alternatives, including doing nothing; the risk(s) and benefit(s) of the alternative treatment(s) or procedure(s); and the risk(s) and benefit(s) of doing nothing. The patient was provided information about the general risks and possible complications associated with the procedure. These may include, but are not limited to: failure to achieve desired goals, infection, bleeding, organ or nerve damage, allergic reactions, paralysis, and death. In addition, the patient was informed of those risks and complications associated to Spine-related procedures, such as failure to decrease pain; infection (i.e.: Meningitis, epidural or intraspinal abscess); bleeding (i.e.: epidural hematoma, subarachnoid hemorrhage, or any other type of intraspinal or peri-dural bleeding); organ or nerve damage (i.e.: Any type of peripheral nerve, nerve root, or spinal cord injury) with subsequent damage to sensory, motor, and/or autonomic systems, resulting in permanent pain, numbness, and/or weakness of one or several areas of the body; allergic reactions; (i.e.: anaphylactic reaction); and/or death. Furthermore, the patient was informed of those risks and complications associated with the medications. These include, but are not limited to: allergic reactions (i.e.: anaphylactic or anaphylactoid reaction(s)); adrenal axis suppression; blood sugar elevation that in diabetics may result in ketoacidosis or comma; water retention that in patients with history of congestive heart failure may result in shortness of breath, pulmonary edema, and decompensation with resultant heart failure; weight gain; swelling or edema; medication-induced neural toxicity; particulate matter embolism and blood vessel occlusion with resultant organ, and/or nervous system infarction; and/or aseptic necrosis of one or more  joints. Finally, the patient was informed that Medicine is not an exact science; therefore, there is also the possibility of unforeseen or unpredictable risks and/or possible complications that may result in a catastrophic outcome. The patient indicated having understood very clearly. We have given the patient no guarantees and we have made no promises. Enough time was given to the patient to ask questions, all of which were answered to the patient's satisfaction. Mr. Trease has indicated that he wanted to continue with the procedure. Attestation: I, the ordering provider, attest that I have discussed with the patient the benefits, risks, side-effects, alternatives, likelihood of achieving goals, and potential problems during recovery for the procedure that  I have provided informed consent. Date  Time: 10/28/2022 11:15 AM   Pre-Procedure Preparation:  Monitoring: As per clinic protocol. Respiration, ETCO2, SpO2, BP, heart rate and rhythm monitor placed and checked for adequate function Safety Precautions: Patient was assessed for positional comfort and pressure points before starting the procedure. Time-out: I initiated and conducted the "Time-out" before starting the procedure, as per protocol. The patient was asked to participate by confirming the accuracy of the "Time Out" information. Verification of the correct person, site, and procedure were performed and confirmed by me, the nursing staff, and the patient. "Time-out" conducted as per Joint Commission's Universal Protocol (UP.01.01.01). Time: 1147  Description of Procedure:          Procedure No.1: Safety Precautions: Aspiration looking for blood return was conducted prior to all injections. At no point did we inject any substances, as a needle was being advanced. Before injecting, the patient was told to immediately notify me if he was experiencing any new onset of "ringing in the ears, or metallic taste in the mouth". No attempts were made at  seeking any paresthesias. Safe injection practices and needle disposal techniques used. Medications properly checked for expiration dates. SDV (single dose vial) medications used. After the completion of the procedure, all disposable equipment used was discarded in the proper designated medical waste containers. Local Anesthesia: Protocol guidelines were followed. The patient was positioned over the fluoroscopy table. The area was prepped in the usual manner. The time-out was completed. The target area was identified using fluoroscopy. A 12-in long, straight, sterile hemostat was used with fluoroscopic guidance to locate the targets for each level blocked. Once located, the skin was marked with an approved surgical skin marker. Once all sites were marked, the skin (epidermis, dermis, and hypodermis), as well as deeper tissues (fat, connective tissue and muscle) were infiltrated with a small amount of a short-acting local anesthetic, loaded on a 10cc syringe with a 25G, 1.5-in  Needle. An appropriate amount of time was allowed for local anesthetics to take effect before proceeding to the next step. Local Anesthetic: Lidocaine 2.0% The unused portion of the local anesthetic was discarded in the proper designated containers. Technical description of process:  L3 Medial Branch Nerve Block (MBB): The target area for the L3 medial branch is at the junction of the postero-lateral aspect of the superior articular process and the superior, posterior, and medial edge of the transverse process of L4. Under fluoroscopic guidance, a Quincke needle was inserted until contact was made with os over the superior postero-lateral aspect of the pedicular shadow (target area). After negative aspiration for blood,2 mL of the nerve block solution was injected without difficulty or complication. The needle was removed intact. L4 Medial Branch Nerve Block (MBB): The target area for the L4 medial branch is at the junction of the  postero-lateral aspect of the superior articular process and the superior, posterior, and medial edge of the transverse process of L5. Under fluoroscopic guidance, a Quincke needle was inserted until contact was made with os over the superior postero-lateral aspect of the pedicular shadow (target area). After negative aspiration for blood, 72m of the nerve block solution was injected without difficulty or complication. The needle was removed intact. L5 Medial Branch Nerve Block (MBB): The target area for the L5 medial branch is at the junction of the postero-lateral aspect of the superior articular process and the superior, posterior, and medial edge of the sacral ala. Under fluoroscopic guidance, a Quincke needle was inserted until  contact was made with os over the superior postero-lateral aspect of the pedicular shadow (target area). After negative aspiration for blood, 2 mL of the nerve block solution was injected without difficulty or complication. The needle was removed intact.   12 cc solution made of 10 cc of 0.2% ropivacaine, 2 cc of Decadron 10 mg/cc.  2 cc injected at each level above bilaterally.         Illustration of the posterior view of the lumbar spine and the posterior neural structures. Laminae of L2 through S1 are labeled. DPRL5, dorsal primary ramus of L5; DPRS1, dorsal primary ramus of S1; DPR3, dorsal primary ramus of L3; FJ, facet (zygapophyseal) joint L3-L4; I, inferior articular process of L4; LB1, lateral branch of dorsal primary ramus of L1; IAB, inferior articular branches from L3 medial branch (supplies L4-L5 facet joint); IBP, intermediate branch plexus; MB3, medial branch of dorsal primary ramus of L3; NR3, third lumbar nerve root; S, superior articular process of L5; SAB, superior articular branches from L4 (supplies L4-5 facet joint also); TP3, transverse process of L3.  Procedure No.2: Safety Precautions: see above. Technical description of procedure: Protocol  guidelines were followed. The patient was placed in position over the fluoroscopy table. The target area was identified and the area prepped in the usual manner. Skin & deeper tissues infiltrated with local anesthetic. Appropriate amount of time allowed to pass for local anesthetics to take effect. The procedure needle was advanced under fluoroscopic guidance into the sacroiliac joint until a firm endpoint was obtained. Proper needle placement secured. Negative aspiration confirmed. Solution injected in intermittent fashion, asking for systemic symptoms every 0.5cc of injectate. The needles were then removed and the area cleansed, making sure to leave some of the prepping solution back to take advantage of its long term bactericidal properties.  8 cc solution made of 4cc of 0.2% ropivacaine, 1 cc of methylprednisolone, 40 mg/cc.  4 cc injected into each SI joint after contrast confirmation  Afterwards a right and left piriformis trigger point injection was done 1 cm inferior, 1 cm deep, 1 cm lateral to the inferior fissure of each SI joint.  Contrast was injected to confirm piriformis muscle striation.  While injecting, patient did not complain of any pain radiating down his leg. 4 cc of 0.2% ropivacaine injected into left piriformis, 4 cc of 0.2% ropivacaine injected into right piriformis     -illustration of inferior approach to sacroiliac joint.  Once the entire procedure was completed, the treated area was cleaned, making sure to leave some of the prepping solution back to take advantage of its long term bactericidal properties.  Vitals:   10/28/22 1152 10/28/22 1156 10/28/22 1203 10/28/22 1208  BP: (!) 190/98 (!) 218/114 (!) 215/114 (!) 210/107  Pulse:      Resp: 16 16 13 14  $ Temp:      SpO2: 99% 100% 100% 99%  Weight:      Height:         Start Time: 1147 hrs. End Time: 1207 hrs.  Imaging Guidance (Spinal):          Type of Imaging Technique: Fluoroscopy Guidance  (Spinal) Indication(s): Assistance in needle guidance and placement for procedures requiring needle placement in or near specific anatomical locations not easily accessible without such assistance. Exposure Time: Please see nurses notes. Contrast: None used. Fluoroscopic Guidance: I was personally present during the use of fluoroscopy. "Tunnel Vision Technique" used to obtain the best possible view of the target area. Parallax  error corrected before commencing the procedure. "Direction-depth-direction" technique used to introduce the needle under continuous pulsed fluoroscopy. Once target was reached, antero-posterior, oblique, and lateral fluoroscopic projection used confirm needle placement in all planes. Images permanently stored in EMR. Interpretation: No contrast injected. I personally interpreted the imaging intraoperatively. Adequate needle placement confirmed in multiple planes. Permanent images saved into the patient's record.  Antibiotic Prophylaxis:   Anti-infectives (From admission, onward)    None      Indication(s): None identified  Post-operative Assessment:  Post-procedure Vital Signs:  Pulse/HCG Rate: 6760 Temp: (!) 97.2 F (36.2 C) Resp: 14 BP: (!) 210/107 SpO2: 99 %  EBL: None  Complications: No immediate post-treatment complications observed by team, or reported by patient.  Note: The patient tolerated the entire procedure well. A repeat set of vitals were taken after the procedure and the patient was kept under observation following institutional policy, for this type of procedure. Post-procedural neurological assessment was performed, showing return to baseline, prior to discharge. The patient was provided with post-procedure discharge instructions, including a section on how to identify potential problems. Should any problems arise concerning this procedure, the patient was given instructions to immediately contact us, at any time, without hesitation. In any case,  we plan to contact the patient by telephone for a follow-up status report regarding this interventional procedure.  Comments:  No additional relevant information.  Plan of Care (POC)  Orders:  Orders Placed This Encounter  Procedures   DG PAIN [facility] C-ARM 1-60 MIN NO REPORT    Intraoperative interpretation by procedural physician at [facility] [place].    Standing Status:   Standing    Number of Occurrences:   1    Order Specific Question:   Reason for exam:    Answer:   Assistance in needle guidance and placement for procedures requiring needle placement in or near specific anatomical locations not easily accessible without such assistance.   Chronic Opioid Analgesic:  Hydrocodone IR 10 mg BID-TID prn    Medications ordered for procedure: Meds ordered this encounter  Medications   iohexol (OMNIPAQUE) 180 MG/ML injection 10 mL    Must be Myelogram-compatible. If not available, you may substitute with a water-soluble, non-ionic, hypoallergenic, myelogram-compatible radiological contrast medium.   lidocaine (XYLOCAINE) 2 % (with pres) injection 400 mg   dexamethasone (DECADRON) injection 10 mg   dexamethasone (DECADRON) injection 10 mg   ropivacaine (PF) 2 mg/mL (0.2%) (NAROPIN) injection 9 mL   methylPREDNISolone acetate (DEPO-MEDROL) injection 40 mg   ropivacaine (PF) 2 mg/mL (0.2%) (NAROPIN) injection 9 mL   Medications administered: We administered iohexol, lidocaine, dexamethasone, dexamethasone, ropivacaine (PF) 2 mg/mL (0.2%), methylPREDNISolone acetate, and ropivacaine (PF) 2 mg/mL (0.2%).  See the medical record for exact dosing, route, and time of administration.  Follow-up plan:   Return in about 4 weeks (around 11/25/2022) for Post Procedure Evaluation, in person.       Recent Visits Date Type Provider Dept  09/29/22 Office Visit [name], MD [facility]  07/30/22 Office Visit [name], MD [facility]  Showing recent visits within  past 90 days and meeting all other requirements Today's Visits Date Type Provider Dept  10/28/22 Procedure visit [name], MD [facility]  Showing today's visits and meeting all other requirements Future Appointments Date Type Provider Dept  11/19/22 Appointment [name], MD [facility]  Showing future appointments within next 90 days and meeting all other requirements  Disposition: Discharge home  Discharge (Date  Time): 10/28/2022; 1219 hrs.   Primary Care Physician: [name], NP Location: [facility] Note by: [name], MD (TTS technology used. I apologize for any typographical errors that were not detected and corrected.) Date: 10/28/2022; Time: 2:03 PM  Disclaimer:  Medicine is not an Chief Strategy Officer. The only guarantee in medicine is that nothing is guaranteed. It is important to note that the decision to proceed with this intervention was based on the information collected from the patient. The Data and conclusions were drawn from the patient's questionnaire, the interview, and the physical examination. Because the information was provided in large part by the patient, it cannot be guaranteed that it has not been purposely or unconsciously manipulated. Every effort has been made to obtain as much relevant data as possible for this evaluation. It is important to note that the conclusions that lead to this procedure are derived in large part from the available data. Always take into account that the treatment will also be dependent on availability of resources and existing treatment guidelines, considered by other Pain Management Practitioners as being common knowledge and practice, at the time of the intervention. For Medico-Legal purposes, it is also important to point out that variation in procedural techniques and pharmacological choices are the acceptable norm. The indications, contraindications, technique, and results of  the above procedure should only be interpreted and judged by a Board-Certified Interventional Pain Specialist with extensive familiarity and expertise in the same exact procedure and technique.

## 2022-10-28 NOTE — Patient Instructions (Signed)

## 2022-10-28 NOTE — Progress Notes (Signed)
Safety precautions to be maintained throughout the outpatient stay will include: orient to surroundings, keep bed in low position, maintain call bell within reach at all times, provide assistance with transfer out of bed and ambulation.  

## 2022-10-29 ENCOUNTER — Telehealth: Payer: Self-pay | Admitting: *Deleted

## 2022-10-29 NOTE — Telephone Encounter (Signed)
No problems post procedure. 

## 2022-10-29 NOTE — Progress Notes (Unsigned)
Cardiology Office Note    Date:  11/02/2022   ID:  [name], [name] 12-17-1945, MRN [id number]  PCP:  [name], NP  Cardiologist:  Glenetta Hew, MD  Electrophysiologist:  None   Chief Complaint: Follow up  History of Present Illness:   Grant Young is a 77 y.o. male with history of CAD with MI x 2 in [place] s/p PTCA and with NSTEMI in 11/2021 status post PCI/DES to the LAD and LCx, HFpEF, HTN, HLD, headaches, GERD, multiple medication intolerances who presents for follow-up of his CAD.   He was admitted to the hospital in 11/2021 with a high risk NSTEMI and underwent PCI to the LAD and LCx.  Mildly reduced LVEF with lateral hypokinesis was noted on LV gram.  Echo demonstrated an EF of 50 to 55%, select images concerning for inferior and lateral wall hypokinesis, indeterminate LV diastolic function parameters, normal RV systolic function and ventricular cavity size, normal PASP, mild mitral valve regurgitation, aorta documented as normal in size and structure, and an estimated right atrial pressure of 3 mmHg.  Following admission, he noted significant fatigue and wondered if this was related to his statin therapy.  He was subsequently evaluated by the lipid clinic and has been initiated on [place].   He was evaluated by pulmonology in the outpatient setting for a persistent cough in the fall 2023, being treated for right lower lobe pneumonia.  CT was performed in 05/2022 with multilobar consolidation with hypermetabolism.  PET scan in 05/2022 showed interval development of multilobar airspace consolidation with associated hypermetabolism.  Findings were suspicious for infectious etiology to document continued improvement and exclude underlying malignancy.     He was subsequently admitted to the hospital from 12/5 through 08/20/2022 with multifocal pneumonia due to rhinovirus.  CT during the admission showed some further improvement in right lower lobe opacity, though findings of  nodular infiltrates are also noted and not improved.  He was evaluated by the hospitalist service, pulmonology, and infectious disease.  He was last seen in the office in 08/2022 and was without symptoms of angina or cardiac decompensation.  His main complaint at that time was fatigue following his recent hospitalization.  He reported outside office had transition him to long-acting propranolol.  In this setting, we underwent a trial of holding chlorthalidone.  He subsequently held [place] to see if this would help some of his abdominal pain, however it did not.  He was evaluated by lipid clinic with recommendation to restart Repatha.  He comes in accompanied by his wife today and is without symptoms of angina or cardiac decompensation.  He has noted increasing back pain, headaches, and GI distress which she has attributed to the addition of Repatha.  In this setting, he held [place] with lipid clinic recommending resumption of Repatha.  He plans to resume Repatha later this week.  He is concerned the symptoms were related to the medication given known sensitivities to medications.  In the setting of increased back pain, he has an increase in his blood pressure with most readings in the [id number] to [id number] systolic with heart rates in the 60s to 50s bpm.  Neurology has uptitrated his propranolol to 80 mg twice daily increasing tremor.  He has tolerated this well.  He prefers to not reinitiate chlorthalidone for elevated BP readings.   Labs independently reviewed: 09/2022 - albumin 3.8, AST/ALT normal, TC 105, TG 110, HDL 49, LDL 34, BUN 11, serum creatinine 0.76, potassium  4.9, Hgb 16.1, PLT 264 10/2019 - TSH normal, A1c 5.1  Past Medical History:  Diagnosis Date   Anxiety    Per New Patient Packet   Chronic back pain    Per New Patient Packet   Chronic heart disease    Per New Patient Packet   Depression    GERD (gastroesophageal reflux disease)    Headache    History of gastritis    Per New Patient  Packet   History of neuropathy    Per New Patient Packet   Hypertension    Per New Patient Packet   Insomnia    Kidney stone    Lumbar radicular pain    Per New Patient Packet   Lyme disease    Per New Patient Packet   Myocardial infarction [facility])    Overactive bladder    PONV (postoperative nausea and vomiting)    Skin cancer, basal cell    Spinal cord stimulator status    01/08/21 - not currently using.   Squamous cell skin cancer    Substance abuse [facility])     Past Surgical History:  Procedure Laterality Date   BACK SURGERY     CARDIAC CATHETERIZATION     CATARACT EXTRACTION W/PHACO Left 01/14/2021   Procedure: CATARACT EXTRACTION PHACO AND INTRAOCULAR LENS PLACEMENT (IOC) LEFT VIVITY TORIC LENS 8.75 00:56.7;  Surgeon: Birder Robson, MD;  Location: [place];  Service: Ophthalmology;  Laterality: Left;   CATARACT EXTRACTION W/PHACO Right 01/28/2021   Procedure: CATARACT EXTRACTION PHACO AND INTRAOCULAR LENS PLACEMENT ([place]) RIGHT VIVITY TORIC LENS;  Surgeon: Birder Robson, MD;  Location: [place];  Service: Ophthalmology;  Laterality: Right;  6.54 00:46.4   CHOLECYSTECTOMY  2017   COLONOSCOPY  09/15/2015   Per New Patient Packet   COLONOSCOPY WITH PROPOFOL N/A 10/03/2020   Procedure: COLONOSCOPY WITH PROPOFOL;  Surgeon: [name], MD;  Location: [facility] ENDOSCOPY;  Service: Gastroenterology;  Laterality: N/A;   CORONARY/GRAFT ACUTE MI REVASCULARIZATION N/A 11/27/2021   Procedure: Coronary/Graft Acute MI Revascularization;  Surgeon: Leonie Man, MD;  Location: [place] CV LAB;  Service: Cardiovascular;  Laterality: N/A;   ESOPHAGOGASTRODUODENOSCOPY (EGD) WITH PROPOFOL N/A 10/03/2020   Procedure: ESOPHAGOGASTRODUODENOSCOPY (EGD) WITH PROPOFOL;  Surgeon: [name], MD;  Location: [facility] ENDOSCOPY;  Service: Gastroenterology;  Laterality: N/A;   FOOT SURGERY Bilateral 03/18/2020   FOOT SURGERY  08/032021   GALLBLADDER SURGERY  09/15/2015    Gallbladder Removal. Procedure done by Dr.Beverly. Per New Patient Packet   KIDNEY STONE SURGERY  09/14/1980   Too many to count. Per New Patient Packet 09/14/1980-09/15/1995   KIDNEY STONE SURGERY  09/15/1995   Too many to count. Per New Patient Packet   LEFT HEART CATH AND CORONARY ANGIOGRAPHY N/A 11/27/2021   Procedure: LEFT HEART CATH AND CORONARY ANGIOGRAPHY;  Surgeon: Leonie Man, MD;  Location: [place] CV LAB;  Service: Cardiovascular;  Laterality: N/A;   LITHOTRIPSY  09/14/2014   Per New Patient Packet   LITHOTRIPSY  09/14/1996   No Stints Used. Per New Patient Packet   LITHOTRIPSY  09/14/1997   No Stints used. Per New Patient Packet   PAIN PUMP IMPLANTATION     PAIN PUMP REMOVAL     SHOULDER SURGERY Right 1984   SIGMOIDOSCOPY  09/14/2017   Per New Patient Packet   SPINAL CORD STIMULATOR IMPLANT     TONSILLECTOMY     UPPER GI ENDOSCOPY      Current Medications: Current Meds  Medication Sig  anastrozole (ARIMIDEX) 1 MG tablet 1/2 tablet (0.5 mg) by mouth once weekly   aspirin 81 MG chewable tablet Chew 1 tablet (81 mg total) by mouth daily.   butalbital-acetaminophen-caffeine (FIORICET) 50-325-40 MG tablet TAKE 1 TABLET BY MOUTH EVERY 6 HOURS AS NEEDED FOR HEADACHE.   cetirizine (ZYRTEC) 10 MG tablet Take 10 mg by mouth daily as needed for allergies.   Cholecalciferol 50 MCG (2000 UT) CAPS Take 1 tablet by mouth daily.   clopidogrel (PLAVIX) 75 MG tablet Take 1 tablet (75 mg) by mouth once daily   Cyanocobalamin (VITAMIN B-12) 5000 MCG LOZG Take 1 lozenge by mouth daily.    DHEA 25 MG CAPS Take 25 mg by mouth daily.   diazepam (VALIUM) 5 MG tablet TAKE 1 TABLET IN THE MORNING AND 2 TABLETS AT BEDTIME   dicyclomine (BENTYL) 10 MG capsule Take 1 capsule (10 mg total) by mouth 4 (four) times daily -  before meals and at bedtime.   Digestive Aids Mixture (DIGESTION GB PO) Take 1 capsule by mouth daily.   escitalopram (LEXAPRO) 20 MG tablet TAKE 1 TABLET BY MOUTH  EVERY DAY   Evolocumab (REPATHA SURECLICK) [id number] MG/ML SOAJ Inject 140 mg into the skin every 14 (fourteen) days.   ezetimibe (ZETIA) 10 MG tablet Take 1 tablet (10 mg total) by mouth daily.   ferrous sulfate 325 (65 FE) MG tablet Take 325 mg by mouth. Once daily on Mondays, Wednesdays and Fridays   hydrALAZINE (APRESOLINE) 25 MG tablet Take 1 tablet (25 mg total) by mouth in the morning and at bedtime.   HYDROcodone-acetaminophen (NORCO) 10-325 MG tablet Take 1 tablet by mouth every 8 (eight) hours as needed for severe pain. Must last 30 days.   ipratropium (ATROVENT) 0.06 % nasal spray Place 1 spray into both nostrils 4 (four) times daily as needed for rhinitis. For up to 5-7 days then stop.   Loperamide HCl (IMODIUM PO) Take by mouth as needed.    MAGNESIUM BISGLYCINATE PO Take by mouth daily.   melatonin 5 MG TABS Take 5 mg by mouth at bedtime.   NONFORMULARY OR COMPOUNDED ITEM Bi-Mix Papaverine 79m, Phentolamine 112m  Dosage: Inject 1cc per injection    Vial 2m72m Qty #5 Refills 6   nortriptyline (PAMELOR) 10 MG capsule Take 10 mg in the morning and 30 mg at night and continue that dose   Nutritional Supplements (NUTRITIONAL SUPPLEMENT PO) Take 1 capsule by mouth daily. [name]   polyethylene glycol (MIRALAX / GLYCOLAX) 17 g packet Take 17 g by mouth daily as needed.   POTASSIUM PO Take by mouth. Takes 5x weekly.   Probiotic Product (PROBIOTIC DAILY PO) Take by mouth daily.   prochlorperazine (COMPAZINE) 10 MG tablet Take 1 tablet (10 mg total) by mouth 2 (two) times daily as needed for nausea or vomiting.   propranolol ER (INDERAL LA) 80 MG 24 hr capsule Take 1 capsule (80 mg total) by mouth 2 (two) times daily.   pyridostigmine (MESTINON) 60 MG tablet Take 0.5 tablets (30 mg total) by mouth 2 (two) times daily.   RABEprazole (ACIPHEX) 20 MG tablet TAKE 1 TABLET BY MOUTH TWICE A DAY   sucralfate (CARAFATE) 1 g tablet Take 1 g by mouth 4 (four) times daily as needed.     testosterone cypionate (DEPOTESTOSTERONE CYPIONATE) 200 MG/ML injection Inject 80 mg into the muscle every 14 (fourteen) days.   valACYclovir (VALTREX) 1000 MG tablet Take 2 tabs p.o. and repeat  in 12 hours as needed for cold sore    Allergies:   Ace inhibitors, Fluoxetine, Metoclopramide, Nalbuphine, Other, Amoxicillin-pot clavulanate, Doxazosin, Duloxetine, Penicillins, Tamsulosin, Trazodone and nefazodone, Amlodipine, Cinoxacin, Ciprofloxacin, Fludrocortisone, Nebivolol, Olanzapine, Olmesartan, Pregabalin, Prostaglandins, Thyroid hormones, Zolpidem, Duloxetine hcl, Fluoxetine hcl, Nucynta [tapentadol], and Phenytoin   Social History   Socioeconomic History   Marital status: Married    Spouse name: Not on file   Number of children: Not on file   Years of education: Not on file   Highest education level: Not on file  Occupational History   Not on file  Tobacco Use   Smoking status: Never    Passive exposure: Never   Smokeless tobacco: Never  Vaping Use   Vaping Use: Never used  Substance and Sexual Activity   Alcohol use: Yes    Comment: 1 Drink a Month, socially   Drug use: Not Currently   Sexual activity: Yes    Birth control/protection: None  Other Topics Concern   Not on file  Social History Narrative   Tobacco use, amount per day now: None   Past tobacco use, amount per day: None   How many years did you use tobacco: 0   Alcohol use (drinks per week): 0-1 Month   Diet:   Do you drink/eat things with caffeine: Occasionally ( Hot Chocolate and maybe 1/4 of 16oz Pepsi 2-3 times a week.   Marital status: Married                                  What year were you married? 1970   Do you live in a house, apartment, assisted living, condo, trailer, etc.? House   Is it one or more stories? One   How many persons live in your home? 2   Do you have pets in your home?( please list)  No   Highest Level of education completed: Masters   Current or past profession: Intensive  Transport planner for [employer]   Do you exercise? Yes                                    Type and how often? [place], Recumbent Bike, 3 times a week. Try to get a 1.2 mile walk at least 3 times a week or more.     Do you have a living will? Yes   Do you have a DNR form?  No                                 If not, do you want to discuss one?   Do you have signed POA/HPOA forms? Yes                       If so, please bring to you appointment    Do you have any difficulty bathing or dressing yourself? No    Do you have difficulty preparing food or eating? No   Do you have difficulty managing your medications? No   Do you have any difficulty managing your finances? No   Do you have any difficulty affording your medications? No         Social Determinants of Health   Financial Resource Strain: Not on file  Food Insecurity: Not on file  Transportation Needs: Not on file  Physical Activity: Not on file  Stress: Not on file  Social Connections: Not on file     Family History:  The patient's family history includes Anxiety disorder in his daughter; Dementia in his mother; Heart disease in his father; Heart failure in his father; Hypertension in his father; Kidney Stones in his daughter; OCD in his daughter; Stroke in his father and mother.  ROS:   12-point review of systems is negative unless otherwise noted in the HPI.   EKGs/Labs/Other Studies Reviewed:    Studies reviewed were summarized above. The additional studies were reviewed today:  2D echo 11/28/2021: 1. Left ventricular ejection fraction, by estimation, is 50 to 55%. The  left ventricle has low normal function. The left ventricle demonstrates  regional wall motion abnormalities (select images with concern for  inferior and lateral wall hypokinesis).  Left ventricular diastolic parameters are indeterminate.   2. Right ventricular systolic function is normal. The right ventricular  size is normal. There is  normal pulmonary artery systolic pressure. The  estimated right ventricular systolic pressure is XX123456 mmHg.   3. The mitral valve is normal in structure. Mild mitral valve  regurgitation. No evidence of mitral stenosis.   4. The aortic valve is normal in structure. Aortic valve regurgitation is  mild. No aortic stenosis is present.   5. The inferior vena cava is normal in size with greater than 50%  respiratory variability, suggesting right atrial pressure of 3 mmHg. __________   LHC 11/27/2021:   CULPRIT LESION: Prox Cx to Mid Cx lesion is 100% stenosed with 70% stenosed side branch in 2nd Mrg.   A drug-eluting stent was successfully placed using a STENT ONYX FRONTIER 2.5X26.  Deployed to 2.7 mm   Post intervention, there is a 0% residual stenosis. Post intervention, the side branch was reduced to 60% residual stenosis.   -------------------------------------------------   LESION SEGMENT #2: Ost LAD to Prox LAD lesion is 75% stenosed.  Prox LAD to Mid LAD lesion is 80% stenosed. Mid LAD lesion is 99% stenosed. (total length ~32 mm)   Prox LAD to Mid LAD lesion is 80% stenosed.   A drug-eluting stent was successfully placed covering all 3 lesions, using a STENT ONYX FRONTIER 2.5X34.  Postdilated to 2.8 mm   Post intervention, there is a 0% residual stenosis.   -------------------------------------------------   Moderately calcified very large dominant RCA with mild disease throughout.   -------------------------------------------------   Post intervention, there is a 0% residual stenosis.   There is mild left ventricular systolic dysfunction. The left ventricular ejection fraction is 45-50% by visual estimate. -Lateral wall hypokinesis   LV end diastolic pressure is severely elevated.   There is no aortic valve stenosis.   SUMMARY Acute Coronary Syndrome / NSTEMI (with STEMI physiology) Severe two-vessel CAD :  Culprit lesion 100% occluded proximal and mid LCx  Sequential ostial  proximal LAD 75, 80 to 99% stenosis Successful revascularization of occluded LCx with Onyx Frontier 2.5 mm x 26 mm deployed to 2.7 mm.  TIMI 0 flow improved to TIMI-3.  Also TIMI-3 flow noted in major/small caliber OM branch with an ostial 60 to 70% stenosis (not PCI during Successful ostial to mid LAD PCI (3 tandem lesions ~total length 32 mmm) covering the total lesion segment with an Onyx Frontier 2.5 mm x 34 mm-deployed/postdilated to 2.8 mm Heavily calcified but widely patent large dominant RCA that did provide collaterals to the  occluded LCx Mildly reduced EF with lateral hypokinesis and significant elevated LVEDP of 28 mmHg     RECOMMENDATIONS ICU admission for post PCI care. Very complex medication list, will need to figure out the best way to treat lipids.  He is on propranolol which was the only tolerated beta-blocker.  Marland Kitchen DAPT per recommendations section Check 2D echo Pending echo results, and symptoms, could potentially consider discharge in 2 to 3 days. __________   2D echo 12/11/2019: 1. Left ventricular ejection fraction, by estimation, is 60 to 65%. The  left ventricle has normal function. The left ventricle has no regional  wall motion abnormalities. Left ventricular diastolic parameters are  consistent with Grade I diastolic  dysfunction (impaired relaxation).   2. Right ventricular systolic function is normal. The right ventricular  size is normal. There is normal pulmonary artery systolic pressure.   3. The mitral valve is normal in structure. No evidence of mitral valve  regurgitation. No evidence of mitral stenosis.   4. The aortic valve is grossly normal. Aortic valve regurgitation is mild  to moderate. No aortic stenosis is present.   5. Pulmonic valve regurgitation not assessed.   6. Aortic dilatation noted. 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   7. The inferior vena cava is normal in size with greater than 50%  respiratory variability,  suggesting right atrial pressure of 3 mmHg.   EKG:  EKG is not ordered today.    Recent Labs: 04/19/2022: B Natriuretic Peptide 149.5 08/20/2022: Magnesium 2.0 09/18/2022: BUN 11; Creat 0.76; Hemoglobin 16.1; Platelets 264; Potassium 4.9; Sodium 143 09/24/2022: ALT 14  Recent Lipid Panel    Component Value Date/Time   CHOL 105 09/24/2022 1445   TRIG 110 09/24/2022 1445   HDL 49 09/24/2022 1445   CHOLHDL 2.1 09/24/2022 1445   VLDL 22 09/24/2022 1445   LDLCALC 34 09/24/2022 1445   LDLCALC 103 (H) 11/11/2021 1006   LDLDIRECT 98.5 01/30/2022 1535    PHYSICAL EXAM:    VS:  BP (!) 154/86 (BP Location: Left Arm, Patient Position: Sitting, Cuff Size: Normal)   Pulse 75   Ht 5' 9"$  (1.753 m)   Wt 138 lb (62.6 kg)   SpO2 95%   BMI 20.38 kg/m   BMI: Body mass index is 20.38 kg/m.  Physical Exam Vitals reviewed.  Constitutional:      Appearance: He is well-developed.  HENT:     Head: Normocephalic and atraumatic.  Eyes:     General:        Right eye: No discharge.        Left eye: No discharge.  Neck:     Vascular: No JVD.  Cardiovascular:     Rate and Rhythm: Normal rate and regular rhythm.     Pulses:          Posterior tibial pulses are 2+ on the right side and 2+ on the left side.     Heart sounds: Normal heart sounds, S1 normal and S2 normal. Heart sounds not distant. No midsystolic click and no opening snap. No murmur heard.    No friction rub.  Pulmonary:     Effort: Pulmonary effort is normal. No respiratory distress.     Breath sounds: Normal breath sounds. No decreased breath sounds, wheezing or rales.  Chest:     Chest wall: No tenderness.  Abdominal:     General: There is no distension.  Musculoskeletal:     Cervical back: Normal  range of motion.     Right lower leg: No edema.     Left lower leg: No edema.  Skin:    General: Skin is warm and dry.     Nails: There is no clubbing.  Neurological:     Mental Status: He is alert and oriented to person, place,  and time.  Psychiatric:        Speech: Speech normal.        Behavior: Behavior normal.        Thought Content: Thought content normal.        Judgment: Judgment normal.     Wt Readings from Last 3 Encounters:  11/02/22 138 lb (62.6 kg)  10/28/22 139 lb (63 kg)  10/20/22 139 lb (63 kg)     ASSESSMENT & PLAN:   CAD involving the native coronary arteries without angina: He is without symptoms of angina or cardiac decompensation.  Continue aggressive risk factor modification and secondary prevention including continuation of DAPT with ASA and clopidogrel, ideally long-term given left main equivalent stenting of the ostial/proximal LAD and proximal LCx with long stents.  Reinitiate antilipid therapy with PCSK9 inhibitor as outlined below, with continuation of ezetimibe.  He remains on propranolol, which is the only beta-blocker he does not have off target effects with.  No indication for further ischemic testing at this time.  HFpEF: He is euvolemic and well compensated.  Not requiring a standing loop diuretic.  Continue optimal blood pressure control.  Given multiple medication intolerances, and in the context of prior urinary frequency, we will defer rechallenge of MRA or SGLT2 inhibitor at this time.  HTN: Blood pressure is running on the higher side, likely in the setting of increased back pain and headache.  He has multiple intolerances to antihypertensives limiting therapy.  We have agreed to undergo a trial of hydralazine 25 mg twice daily with titration as indicated if he tolerates it.  He otherwise remains on propranolol 80 mg twice daily.  HLD with statin intolerance: LDL 34 in 09/2022.  Currently off Repatha due to his concern for occult target side effects including back pain, headache, and GI distress.  These symptoms have improved somewhat, though not resolved while off of PCSK9 inhibitor.  In this setting, lipid clinic is recommended he reinitiate Repatha which the patient plans to  do later this week.  He remains on ezetimibe.   Disposition: F/u with Dr. Ellyn Hack to establish care in 1 month.   Medication Adjustments/Labs and Tests Ordered: Current medicines are reviewed at length with the patient today.  Concerns regarding medicines are outlined above. Medication changes, Labs and Tests ordered today are summarized above and listed in the Patient Instructions accessible in Encounters.   Signed, Christell Faith, PA-C 11/02/2022 12:38 PM     [address] [place], [place] [address] [phone]

## 2022-11-02 ENCOUNTER — Encounter: Payer: Self-pay | Admitting: Physician Assistant

## 2022-11-02 ENCOUNTER — Ambulatory Visit: Payer: Medicare Other | Attending: Physician Assistant | Admitting: Physician Assistant

## 2022-11-02 VITALS — BP 154/86 | HR 75 | Ht 69.0 in | Wt 138.0 lb

## 2022-11-02 DIAGNOSIS — I2510 Atherosclerotic heart disease of native coronary artery without angina pectoris: Secondary | ICD-10-CM | POA: Diagnosis present

## 2022-11-02 DIAGNOSIS — I10 Essential (primary) hypertension: Secondary | ICD-10-CM

## 2022-11-02 DIAGNOSIS — E785 Hyperlipidemia, unspecified: Secondary | ICD-10-CM

## 2022-11-02 DIAGNOSIS — Z789 Other specified health status: Secondary | ICD-10-CM

## 2022-11-02 DIAGNOSIS — I25110 Atherosclerotic heart disease of native coronary artery with unstable angina pectoris: Secondary | ICD-10-CM | POA: Diagnosis not present

## 2022-11-02 DIAGNOSIS — I5032 Chronic diastolic (congestive) heart failure: Secondary | ICD-10-CM | POA: Diagnosis not present

## 2022-11-02 MED ORDER — HYDRALAZINE HCL 25 MG PO TABS: 25.0000 mg | ORAL_TABLET | Freq: Two times a day (BID) | ORAL | 3 refills | Status: DC

## 2022-11-02 NOTE — Patient Instructions (Signed)
Medication Instructions:  Your physician has recommended you make the following change in your medication:   START - hydrALAZINE (APRESOLINE) 25 MG tablet - Take 1 tablet by mouth twice a day 1 in the morning and 1 at bedtime  *If you need a refill on your cardiac medications before your next appointment, please call your pharmacy*   Lab Work: - None ordered If you have labs (blood work) drawn today and your tests are completely normal, you will receive your results only by: [place] (if you have MyChart) OR A paper copy in the mail If you have any lab test that is abnormal or we need to change your treatment, we will call you to review the results.   Testing/Procedures: - None ordered   Follow-Up: At [facility], you and your health needs are our priority.  As part of our continuing mission to provide you with exceptional heart care, we have created designated Provider Care Teams.  These Care Teams include your primary Cardiologist (physician) and Advanced Practice Providers (APPs -  Physician Assistants and Nurse Practitioners) who all work together to provide you with the care you need, when you need it.  We recommend signing up for the patient portal called "MyChart".  Sign up information is provided on this After Visit Summary.  MyChart is used to connect with patients for Virtual Visits (Telemedicine).  Patients are able to view lab/test results, encounter notes, upcoming appointments, etc.  Non-urgent messages can be sent to your provider as well.   To learn more about what you can do with MyChart, go to [email or web address].    Your next appointment:   1 month(s)  Provider:   Glenetta Hew, MD    Other Instructions - None ordered

## 2022-11-09 ENCOUNTER — Telehealth: Payer: Medicare Other | Admitting: Internal Medicine

## 2022-11-11 NOTE — Addendum Note (Signed)
Addended by: Ashley Royalty E on: 11/11/2022 11:41 AM   Modules accepted: Orders

## 2022-11-12 ENCOUNTER — Other Ambulatory Visit: Payer: Self-pay | Admitting: Specialist

## 2022-11-12 DIAGNOSIS — R9389 Abnormal findings on diagnostic imaging of other specified body structures: Secondary | ICD-10-CM

## 2022-11-12 MED ORDER — BUTALBITAL-APAP-CAFFEINE 50-325-40 MG PO TABS: ORAL_TABLET | ORAL | 0 refills | Status: DC

## 2022-11-15 ENCOUNTER — Other Ambulatory Visit: Payer: Self-pay | Admitting: Internal Medicine

## 2022-11-16 NOTE — Telephone Encounter (Signed)
Unable to refill per protocol, Rx expired. Discontinued 10/26/22.  Requested Prescriptions  Pending Prescriptions Disp Refills   nortriptyline (PAMELOR) 25 MG capsule [Pharmacy Med Name: NORTRIPTYLINE HCL 25 MG CAP] 90 capsule 0    Sig: TAKE 1 CAPSULE BY MOUTH AT BEDTIME.     Psychiatry:  Antidepressants - Heterocyclics (TCAs) Passed - 11/15/2022  8:46 AM      Passed - Completed PHQ-2 or PHQ-9 in the last 360 days      Passed - Valid encounter within last 6 months    Recent Outpatient Visits           3 weeks ago Essential hypertension   [place], RPH-CPP   1 month ago Generalized abdominal pain   [facility] [place], [name], NP   2 months ago Atypical pneumonia   [facility] [place], [name], NP   4 months ago Essential hypertension   [place], Grayland Ormond A, RPH-CPP   5 months ago Intractable chronic migraine without aura and without status migrainosus   [facility] [place], [name], NP       Future Appointments             In 4 days [place], [name], NP [facility], [place]   In 1 month Ellyn Hack, Leonie Green, MD [place] [facility] [place]   In 6 months Diamantina Providence, Herbert Seta, MD [place]

## 2022-11-17 ENCOUNTER — Ambulatory Visit: Payer: Medicare Other | Admitting: Internal Medicine

## 2022-11-19 ENCOUNTER — Ambulatory Visit
Payer: Worker's Compensation | Attending: Student in an Organized Health Care Education/Training Program | Admitting: Student in an Organized Health Care Education/Training Program

## 2022-11-19 ENCOUNTER — Encounter: Payer: Self-pay | Admitting: Student in an Organized Health Care Education/Training Program

## 2022-11-19 VITALS — BP 164/81 | HR 67 | Temp 97.2°F | Resp 17 | Ht 69.0 in | Wt 139.5 lb

## 2022-11-19 DIAGNOSIS — M533 Sacrococcygeal disorders, not elsewhere classified: Secondary | ICD-10-CM

## 2022-11-19 DIAGNOSIS — G894 Chronic pain syndrome: Secondary | ICD-10-CM | POA: Diagnosis present

## 2022-11-19 DIAGNOSIS — G5703 Lesion of sciatic nerve, bilateral lower limbs: Secondary | ICD-10-CM

## 2022-11-19 DIAGNOSIS — M47816 Spondylosis without myelopathy or radiculopathy, lumbar region: Secondary | ICD-10-CM

## 2022-11-19 NOTE — Progress Notes (Signed)
PROVIDER NOTE: Information contained herein reflects review and annotations entered in association with encounter. Interpretation of such information and data should be left to medically-trained personnel. Information provided to patient can be located elsewhere in the medical record under "Patient Instructions". Document created using STT-dictation technology, any transcriptional errors that may result from process are unintentional.    Patient: Grant Young  Service Category: E/M  Provider: [name], MD  DOB: Jul 29, 1946  DOS: 11/19/2022  Specialty: Interventional Pain Management  MRN: [id number]  Setting: Ambulatory outpatient  PCP: [name], NP  Type: Established Patient    Referring Provider: [name], NP  Location: Office  Delivery: Face-to-face     HPI  Grant Young, a 77 y.o. year old male, is here today because of his Lumbar facet arthropathy [M47.816]. Grant Young primary complain today is Back Pain (Mid and lower) Last encounter: My last encounter with him was on 10/28/22 Pertinent problems: Grant Young has Lumbar facet arthropathy and Chronic pain syndrome on their pertinent problem list. Pain Assessment: Severity of Chronic pain is reported as a 4 /10. Location: Back Mid, Lower/denies. Onset: More than a month ago. Quality: Aching, Constant. Timing: Constant. Modifying factor(s): ice, meds, rest. Vitals:  height is '5\' 9"'$  (1.753 m) and weight is 139 lb 8 oz (63.3 kg). His temporal temperature is 97.2 F (36.2 C) (abnormal). His blood pressure is 164/81 (abnormal) and his pulse is 67. His respiration is 17 and oxygen saturation is 100%.   Reason for encounter:   Post-procedure evaluation   Procedure: Lumbar Facet, Medial Branch Block(s) #2    Laterality: Bilateral  Level(s): L3, L4, and L5 Medial Branch Level(s).  Rationale: Injecting these levels should provide temporary sensory nerve conduction blockade of the L3-4 and L4-5 lumbar facet (zygapophyseal)  joints. Target: Terminal medial branch nerve division of lumbosacral nerve root dorsal rami.  Procedure No.2: Sacroiliac joint injection #1    Laterality: Bilateral     Approach: Inferior postero-medial percutaneous approach. Level: PIIS (Posterior Inferior Iliac Spine) Sacroiliac Joint Target: For lower sacroiliac joint block(s), the target is the inferior and posterior margin of the sacroiliac joint.  Imaging: Fluoroscopic guidance         Anesthesia: Local anesthesia (1-2% Lidocaine) DOS: 10/28/2022 Performed by: [name], MD  Patient stopped Plavix 7 days prior  Purpose: Diagnostic/Therapeutic Indications: Low back pain severe enough to impact quality of life or function. Medical necessity rationale: procedure needed and proper for the diagnosis and/or treatment of the patient's medical symptoms and needs. 1. SI (sacroiliac) joint dysfunction   2. Lumbar facet arthropathy   3. Lumbar spondylosis   4. Chronic pain syndrome   5. Piriformis syndrome of both sides    NAS-11 Pain score:   Pre-procedure: 7 /10   Post-procedure: 0-No pain/10      Effectiveness:  Initial hour after procedure: 100 %  Subsequent 4-6 hours post-procedure: 80 %  Analgesia past initial 6 hours: 50 %  Ongoing improvement:  Analgesic:  50% Function: Grant Young reports improvement in function ROM: Grant Young reports improvement in ROM    Pharmacotherapy Assessment  Analgesic: Hydrocodone IR 10 mg BID-TID prn    Monitoring: [place] PMP: PDMP reviewed during this encounter.       Pharmacotherapy: No side-effects or adverse reactions reported. Compliance: No problems identified. Effectiveness: Clinically acceptable.  Rise Patience, RN  11/19/2022 11:14 AM  Sign when Signing Visit Safety precautions to be maintained throughout the outpatient stay will include: orient  to surroundings, keep bed in low position, maintain call bell within reach at all times, provide assistance with transfer out of bed and  ambulation.      UDS:  Summary  Date Value Ref Range Status  09/17/2020 Note  Final    Comment:    ==================================================================== Compliance Drug Analysis, Ur ==================================================================== Specimen Alert Note: Urinary creatinine is low; ability to detect some drugs may be compromised. Interpret results with caution. (Creatinine) ==================================================================== Test                             Result       Flag       Units  Drug Present and Declared for Prescription Verification   Lorazepam                      1107         EXPECTED   ng/mg creat    Source of lorazepam is a scheduled prescription medication.    Butalbital                     PRESENT      EXPECTED   Citalopram                     PRESENT      EXPECTED   Desmethylcitalopram            PRESENT      EXPECTED    Desmethylcitalopram is an expected metabolite of citalopram or the    enantiomeric form, escitalopram.    Acetaminophen                  PRESENT      EXPECTED   Propranolol                    PRESENT      EXPECTED  Drug Present not Declared for Prescription Verification   Oxazepam                       300          UNEXPECTED ng/mg creat   Temazepam                      357          UNEXPECTED ng/mg creat    Oxazepam and temazepam are expected metabolites of diazepam.    Oxazepam is also an expected metabolite of other benzodiazepine    drugs, including chlordiazepoxide, prazepam, clorazepate, halazepam,    and temazepam.  Oxazepam and temazepam are available as scheduled    prescription medications.  Drug Absent but Declared for Prescription Verification   Hydrocodone                    Not Detected UNEXPECTED ng/mg creat   Tizanidine                     Not Detected UNEXPECTED    Tizanidine, as indicated in the declared medication list, is not    always detected even when used as  directed.    Amitriptyline                  Not Detected UNEXPECTED   Prochlorperazine               Not Detected UNEXPECTED  Salicylate                     Not Detected UNEXPECTED    Aspirin, as indicated in the declared medication list, is not always    detected even when used as directed.  ==================================================================== Test                      Result    Flag   Units      Ref Range   Creatinine              14        LL     mg/dL      >=20 ==================================================================== Declared Medications:  The flagging and interpretation on this report are based on the  following declared medications.  Unexpected results may arise from  inaccuracies in the declared medications.   **Note: The testing scope of this panel includes these medications:   Amitriptyline (Elavil)  Butalbital (Fioricet)  Butalbital (Fiorinal)  Escitalopram (Lexapro)  Hydrocodone (Norco)  Lorazepam (Ativan)  Prochlorperazine (Compazine)  Propranolol (Inderal)   **Note: The testing scope of this panel does not include small to  moderate amounts of these reported medications:   Acetaminophen (Fioricet)  Acetaminophen (Norco)  Aspirin (Fiorinal)  Tizanidine (Zanaflex)   **Note: The testing scope of this panel does not include the  following reported medications:   Anastrozole (Arimidex)  Caffeine (Fioricet)  Caffeine (Fiorinal)  Chlorthalidone (Hygroton)  Cholecalciferol  Cholestyramine (Questran)  Dicyclomine (Bentyl)  Ezetimibe (Zetia)  Hydrocortisone  Melatonin  Ondansetron (Zofran)  Potassium (Klor-Con)  Rabeprazole (Aciphex)  Sucralfate (Carafate)  Valacyclovir (Valtrex)  Vitamin B12 ==================================================================== For clinical consultation, please call [phone]. ====================================================================      ROS  Constitutional: Denies any fever  or chills Gastrointestinal: No reported hemesis, hematochezia, vomiting, or acute GI distress Musculoskeletal:  Low back pain bilateral SI joint, bilateral piriformis Neurological: No reported episodes of acute onset apraxia, aphasia, dysarthria, agnosia, amnesia, paralysis, loss of coordination, or loss of consciousness  Medication Review  Cholecalciferol, DHEA, Digestive Aids Mixture, Evolocumab, HYDROcodone-acetaminophen, Loperamide HCl, Magnesium Bisglycinate, NONFORMULARY OR COMPOUNDED ITEM, Nutritional Supplements, Potassium, Probiotic Product, RABEprazole, Vitamin B-12, anastrozole, aspirin, butalbital-acetaminophen-caffeine, cetirizine, clopidogrel, diazepam, dicyclomine, escitalopram, ezetimibe, ferrous sulfate, ipratropium, melatonin, nortriptyline, polyethylene glycol, prochlorperazine, propranolol ER, pyridostigmine, sucralfate, testosterone cypionate, and valACYclovir  History Review  Allergy: Grant Young is allergic to ace inhibitors, fluoxetine, metoclopramide, nalbuphine, other, amoxicillin-pot clavulanate, doxazosin, duloxetine, penicillins, tamsulosin, trazodone and nefazodone, amlodipine, cinoxacin, ciprofloxacin, fludrocortisone, nebivolol, olanzapine, olmesartan, pregabalin, prostaglandins, thyroid hormones, zolpidem, duloxetine hcl, fluoxetine hcl, nucynta [tapentadol], and phenytoin. Drug: Grant Young  reports that he does not currently use drugs. Alcohol:  reports current alcohol use. Tobacco:  reports that he has never smoked. He has never been exposed to tobacco smoke. He has never used smokeless tobacco. Social: Grant Young  reports that he has never smoked. He has never been exposed to tobacco smoke. He has never used smokeless tobacco. He reports current alcohol use. He reports that he does not currently use drugs. Medical:  has a past medical history of Anxiety, Chronic back pain, Chronic heart disease, Depression, GERD (gastroesophageal reflux disease), Headache, History of  gastritis, History of neuropathy, Hypertension, Insomnia, Kidney stone, Lumbar radicular pain, Lyme disease, Myocardial infarction ([place]), Overactive bladder, PONV (postoperative nausea and vomiting), Skin cancer, basal cell, Spinal cord stimulator status, Squamous cell skin cancer, and Substance abuse ([place]). Surgical: Grant Young  has a past surgical history that includes Kidney  stone surgery (09/14/1980); Kidney stone surgery (09/15/1995); Lithotripsy (09/14/2014); Lithotripsy (09/14/1996); Lithotripsy (09/14/1997); Gallbladder surgery (09/15/2015); Sigmoidoscopy (09/14/2017); Colonoscopy (09/15/2015); Cholecystectomy (2017); Shoulder surgery (Right, 1984); Back surgery; Spinal cord stimulator implant; Pain pump implantation; Pain pump removal; Tonsillectomy; Foot surgery (Bilateral, 03/18/2020); Foot surgery (08/032021); Upper gi endoscopy; Cardiac catheterization; Colonoscopy with propofol (N/A, 10/03/2020); Esophagogastroduodenoscopy (egd) with propofol (N/A, 10/03/2020); Cataract extraction w/PHACO (Left, 01/14/2021); Cataract extraction w/PHACO (Right, 01/28/2021); Coronary/Graft Acute MI Revascularization (N/A, 11/27/2021); LEFT HEART CATH AND CORONARY ANGIOGRAPHY (N/A, 11/27/2021); and Excision basal cell carcinoma. Family: family history includes Anxiety disorder in his daughter; Dementia in his mother; Heart disease in his father; Heart failure in his father; Hypertension in his father; Kidney Stones in his daughter; OCD in his daughter; Stroke in his father and mother.  Laboratory Chemistry Profile   Renal Lab Results  Component Value Date   BUN 11 09/18/2022   CREATININE 0.76 09/18/2022   BCR SEE NOTE: 09/18/2022   GFRAA 101 05/20/2020   GFRNONAA >60 08/20/2022    Hepatic Lab Results  Component Value Date   AST 18 09/24/2022   ALT 14 09/24/2022   ALBUMIN 3.8 09/24/2022   ALKPHOS 107 09/24/2022   AMYLASE 32 09/18/2022   LIPASE 14 09/18/2022    Electrolytes Lab Results  Component  Value Date   NA 143 09/18/2022   K 4.9 09/18/2022   CL 104 09/18/2022   CALCIUM 9.5 09/18/2022   MG 2.0 08/20/2022   PHOS 3.6 11/30/2021    Bone Lab Results  Component Value Date   TESTOSTERONE 33.2 05/20/2020    Inflammation (CRP: Acute Phase) (ESR: Chronic Phase) Lab Results  Component Value Date   CRP 22.9 (H) 08/18/2022   LATICACIDVEN 1.4 08/18/2022         Note: Above Lab results reviewed.   Physical Exam  General appearance: Well nourished, well developed, and well hydrated. In no apparent acute distress Mental status: Alert, oriented x 3 (person, place, & time)       Respiratory: No evidence of acute respiratory distress Eyes: PERLA Vitals: BP (!) 164/81   Pulse 67   Temp (!) 97.2 F (36.2 C) (Temporal)   Resp 17   Ht '5\' 9"'$  (1.753 m)   Wt 139 lb 8 oz (63.3 kg)   SpO2 100%   BMI 20.60 kg/m  BMI: Estimated body mass index is 20.6 kg/m as calculated from the following:   Height as of this encounter: '5\' 9"'$  (1.753 m).   Weight as of this encounter: 139 lb 8 oz (63.3 kg). Ideal: Ideal body weight: 70.7 kg (155 lb 13.8 oz)  Lumbar Spine Area Exam  Skin & Axial Inspection: Well healed scar from previous spine surgery detected IPG present from SCS Alignment: Scoliosis detected Functional ROM: Pain restricted ROM       Stability: No instability detected Muscle Tone/Strength: Functionally intact. No obvious neuro-muscular anomalies detected. Sensory (Neurological): Facetogenic pain pattern, pain with facet loading  Hyperextension/rotation test: (+) bilaterally for facet joint pain. Lumbar quadrant test (Kemp's test): (+) bilaterally for facet joint pain.   Gait & Posture Assessment  Ambulation: Unassisted Gait: Relatively normal for age and body habitus Posture: WNL    Lower Extremity Exam      Side: Right lower extremity   Side: Left lower extremity  Stability: No instability observed           Stability: No instability observed          Skin & Extremity  Inspection: Skin color, temperature,  and hair growth are WNL. No peripheral edema or cyanosis. No masses, redness, swelling, asymmetry, or associated skin lesions. No contractures.   Skin & Extremity Inspection: Skin color, temperature, and hair growth are WNL. No peripheral edema or cyanosis. No masses, redness, swelling, asymmetry, or associated skin lesions. No contractures.  Functional ROM: Unrestricted ROM                   Functional ROM: Unrestricted ROM                  Muscle Tone/Strength: Functionally intact. No obvious neuro-muscular anomalies detected.   Muscle Tone/Strength: Functionally intact. No obvious neuro-muscular anomalies detected.  Sensory (Neurological): Unimpaired         Sensory (Neurological): Unimpaired        DTR: Patellar: deferred today Achilles: deferred today Plantar: deferred today   DTR: Patellar: deferred today Achilles: deferred today Plantar: deferred today  Palpation: No palpable anomalies   Palpation: No palpable anomalies     Assessment    Diagnosis   1. Lumbar facet arthropathy   2. Lumbar spondylosis   3. SI (sacroiliac) joint dysfunction   4. Piriformis syndrome of both sides   5. Chronic pain syndrome         Plan of Care   1. Lumbar facet arthropathy  2. Lumbar spondylosis  3. SI (sacroiliac) joint dysfunction  4. Piriformis syndrome of both sides  5. Chronic pain syndrome  Patient had a positive response after his 2 sets of diagnostic lumbar facet medial branch nerve blocks.  We have discussed lumbar radiofrequency ablation versus Sprint peripheral nerve stimulation for his low back pain.  He states that he will think about the 2 further and let us know.  Follow-up in 6 weeks for medication management and to further discuss treatment plan of RFA versus Sprint PNS  Follow-up plan:   Return in about 6 weeks (around 12/31/2022) for Medication Management and discus RFA vs PNS of MBB.     Status post Botox No. 1 on 01/01/2020  for migraine, lumbar TPI as well. SPG block 03/13/20. Bilateral GONB 04/01/20. 11/13/20 B/L L3,4,5 Facet blocks helpful repeat prn           Recent Visits Date Type Provider Dept  10/28/22 Procedure visit [name], MD [facility]  09/29/22 Office Visit [name], MD [facility]  Showing recent visits within past 90 days and meeting all other requirements Today's Visits Date Type Provider Dept  11/19/22 Office Visit [name], MD [facility]  Showing today's visits and meeting all other requirements Future Appointments Date Type Provider Dept  12/29/22 Appointment [name], MD [facility]  Showing future appointments within next 90 days and meeting all other requirements  I discussed the assessment and treatment plan with the patient. The patient was provided an opportunity to ask questions and all were answered. The patient agreed with the plan and demonstrated an understanding of the instructions.  Patient advised to call back or seek an in-person evaluation if the symptoms or condition worsens.  Duration of encounter: 59mnutes.  Note by: BGillis Santa MD Date: 11/19/2022; Time: 11:35 AM

## 2022-11-19 NOTE — Patient Instructions (Signed)
SPRINT PNS - Peripheral Nerve Stimulator low back pain

## 2022-11-19 NOTE — Progress Notes (Signed)
Safety precautions to be maintained throughout the outpatient stay will include: orient to surroundings, keep bed in low position, maintain call bell within reach at all times, provide assistance with transfer out of bed and ambulation.  

## 2022-11-20 ENCOUNTER — Ambulatory Visit (INDEPENDENT_AMBULATORY_CARE_PROVIDER_SITE_OTHER): Payer: Medicare Other | Admitting: Internal Medicine

## 2022-11-20 ENCOUNTER — Encounter: Payer: Self-pay | Admitting: Internal Medicine

## 2022-11-20 VITALS — BP 134/78 | HR 65 | Temp 96.4°F | Wt 141.0 lb

## 2022-11-20 DIAGNOSIS — G43719 Chronic migraine without aura, intractable, without status migrainosus: Secondary | ICD-10-CM

## 2022-11-20 DIAGNOSIS — F419 Anxiety disorder, unspecified: Secondary | ICD-10-CM

## 2022-11-20 DIAGNOSIS — G2581 Restless legs syndrome: Secondary | ICD-10-CM

## 2022-11-20 DIAGNOSIS — E785 Hyperlipidemia, unspecified: Secondary | ICD-10-CM

## 2022-11-20 DIAGNOSIS — I2510 Atherosclerotic heart disease of native coronary artery without angina pectoris: Secondary | ICD-10-CM

## 2022-11-20 DIAGNOSIS — G629 Polyneuropathy, unspecified: Secondary | ICD-10-CM

## 2022-11-20 DIAGNOSIS — F32A Depression, unspecified: Secondary | ICD-10-CM

## 2022-11-20 DIAGNOSIS — K219 Gastro-esophageal reflux disease without esophagitis: Secondary | ICD-10-CM

## 2022-11-20 DIAGNOSIS — E349 Endocrine disorder, unspecified: Secondary | ICD-10-CM

## 2022-11-20 DIAGNOSIS — R251 Tremor, unspecified: Secondary | ICD-10-CM

## 2022-11-20 DIAGNOSIS — I951 Orthostatic hypotension: Secondary | ICD-10-CM

## 2022-11-20 DIAGNOSIS — M47816 Spondylosis without myelopathy or radiculopathy, lumbar region: Secondary | ICD-10-CM

## 2022-11-20 DIAGNOSIS — Z8619 Personal history of other infectious and parasitic diseases: Secondary | ICD-10-CM

## 2022-11-20 DIAGNOSIS — A6920 Lyme disease, unspecified: Secondary | ICD-10-CM

## 2022-11-20 DIAGNOSIS — I10 Essential (primary) hypertension: Secondary | ICD-10-CM

## 2022-11-20 DIAGNOSIS — K582 Mixed irritable bowel syndrome: Secondary | ICD-10-CM

## 2022-11-20 DIAGNOSIS — G894 Chronic pain syndrome: Secondary | ICD-10-CM

## 2022-11-20 DIAGNOSIS — N3281 Overactive bladder: Secondary | ICD-10-CM

## 2022-11-20 MED ORDER — VENLAFAXINE HCL ER 75 MG PO CP24: 75.0000 mg | ORAL_CAPSULE | Freq: Every day | ORAL | 1 refills | Status: DC

## 2022-11-20 NOTE — Assessment & Plan Note (Signed)
He will continue to follow-up with integrative medicine

## 2022-11-20 NOTE — Assessment & Plan Note (Signed)
Continue Bentyl, MiraLAX and Imodium as needed

## 2022-11-20 NOTE — Assessment & Plan Note (Signed)
-   Continue Mestinon °

## 2022-11-20 NOTE — Assessment & Plan Note (Signed)
Continue hydrocodone for pain management

## 2022-11-20 NOTE — Assessment & Plan Note (Signed)
Continue testosterone injections and Arimidex He will continue to follow with urology

## 2022-11-20 NOTE — Assessment & Plan Note (Signed)
Controlled only using Mestinon for orthostasis Reinforced DASH diet

## 2022-11-20 NOTE — Assessment & Plan Note (Signed)
Will transition him from escitalopram to venlafaxine

## 2022-11-20 NOTE — Assessment & Plan Note (Signed)
Continue valacyclovir as needed 

## 2022-11-20 NOTE — Progress Notes (Signed)
Subjective:    Patient ID: Grant Young, male    DOB: 16-Mar-1946, 77 y.o.   MRN: [id number]  HPI  Patient presents to clinic today for follow-up of chronic conditions.  Anxiety and Depression: Chronic, managed on Escitalopram and Diazepam. He does feel like this has been worse lately because of his headaches and back pain. He would like to switch from Escitalopram to Venlafaxine. He is not currently seeing a therapist.  He denies SI/HI.  Migraines: These occur daily.  He is not able to identify his triggers.  He is taking Nortriptyline, Propranolol and Fioricet as prescribed. He does feel like the Repatha is making his headaches worse. He follows with neurology.  HTN: His BP today is 134/78.  He is not taking any antihypertensive medication at this time but is taking Mestinon as prescribed for orthostasis.  ECG from 04/2022 reviewed.  HLD with CAD status post MI: His last LDL was 34, triglycerides 110, 09/2022.  He is taking Ezetimibe, Propranolol, Aspirin and Plavix as prescribed. He does not want to take Repatha at this time. He tries to consume a low-fat diet.  Chronic Back Pain/Neuropathy/RLS: Status post multiple back surgeries and spinal cord stimulator placement. He recently had an injection which helped. He is taking Hydrocodone as prescribed by pain management.  History of Cold Sores: He denies recent outbreak.  He takes Valacyclovir only as needed.  GERD: He is not sure what triggers this.  He denies breakthrough on Rabeprazole and Carafate.  Upper GI from 09/2020 reviewed.  Hypotestosteronism: Managed with Testosterone injections and Arimidex.  He follows with urology.  IBS: Alternating constipation and diarrhea.  He manages this with Bentyl, MiraLAX and Imodium as needed.  He follows with GI.  History of Lyme disease: He is not currently taking any medications for this.  He follows with integrative medication for this.  OAB: Mainly urinary frequency.  He is not taking any  medications for this at this time.  He follows with urology.  Tremor: Managed with Propranolol.  He follows with neurology.  Review of Systems     Past Medical History:  Diagnosis Date   Anxiety    Per New Patient Packet   Chronic back pain    Per New Patient Packet   Chronic heart disease    Per New Patient Packet   Depression    GERD (gastroesophageal reflux disease)    Headache    History of gastritis    Per New Patient Packet   History of neuropathy    Per New Patient Packet   Hypertension    Per New Patient Packet   Insomnia    Kidney stone    Lumbar radicular pain    Per New Patient Packet   Lyme disease    Per New Patient Packet   Myocardial infarction [facility])    Overactive bladder    PONV (postoperative nausea and vomiting)    Skin cancer, basal cell    Spinal cord stimulator status    01/08/21 - not currently using.   Squamous cell skin cancer    Substance abuse (HCC)     Current Outpatient Medications  Medication Sig Dispense Refill   anastrozole (ARIMIDEX) 1 MG tablet 1/2 tablet (0.5 mg) by mouth once weekly     aspirin 81 MG chewable tablet Chew 1 tablet (81 mg total) by mouth daily. 90 tablet 3   butalbital-acetaminophen-caffeine (FIORICET) 50-325-40 MG tablet TAKE 1 TABLET BY MOUTH EVERY 6 HOURS AS NEEDED FOR  HEADACHE. 90 tablet 0   cetirizine (ZYRTEC) 10 MG tablet Take 10 mg by mouth daily as needed for allergies.     Cholecalciferol 50 MCG (2000 UT) CAPS Take 1 tablet by mouth daily.     clopidogrel (PLAVIX) 75 MG tablet Take 1 tablet (75 mg) by mouth once daily 90 tablet 2   Cyanocobalamin (VITAMIN B-12) 5000 MCG LOZG Take 1 lozenge by mouth daily.      DHEA 25 MG CAPS Take 25 mg by mouth daily.     diazepam (VALIUM) 5 MG tablet TAKE 1 TABLET IN THE MORNING AND 2 TABLETS AT BEDTIME 90 tablet 0   dicyclomine (BENTYL) 10 MG capsule Take 1 capsule (10 mg total) by mouth 4 (four) times daily -  before meals and at bedtime. 90 capsule 1   Digestive Aids  Mixture (DIGESTION GB PO) Take 1 capsule by mouth daily.     escitalopram (LEXAPRO) 20 MG tablet TAKE 1 TABLET BY MOUTH EVERY DAY 90 tablet 1   Evolocumab (REPATHA SURECLICK) [id number] MG/ML SOAJ Inject 140 mg into the skin every 14 (fourteen) days. 2 mL 11   ezetimibe (ZETIA) 10 MG tablet Take 1 tablet (10 mg total) by mouth daily. 90 tablet 1   ferrous sulfate 325 (65 FE) MG tablet Take 325 mg by mouth. Once daily on Mondays, Wednesdays and Fridays     HYDROcodone-acetaminophen (NORCO) 10-325 MG tablet Take 1 tablet by mouth every 8 (eight) hours as needed for severe pain. Must last 30 days. 90 tablet 0   ipratropium (ATROVENT) 0.06 % nasal spray Place 1 spray into both nostrils 4 (four) times daily as needed for rhinitis. For up to 5-7 days then stop. 15 mL 0   Loperamide HCl (IMODIUM PO) Take by mouth as needed.      MAGNESIUM BISGLYCINATE PO Take by mouth daily.     melatonin 5 MG TABS Take 5 mg by mouth at bedtime.     NONFORMULARY OR COMPOUNDED ITEM Bi-Mix Papaverine '30mg'$ , Phentolamine '1mg'$    Dosage: Inject 1cc per injection    Vial 33m   Qty #5 Refills 6 5 each 6   nortriptyline (PAMELOR) 10 MG capsule Take 10 mg in the morning and 30 mg at night and continue that dose     Nutritional Supplements (NUTRITIONAL SUPPLEMENT PO) Take 1 capsule by mouth daily. [name]     polyethylene glycol (MIRALAX / GLYCOLAX) 17 g packet Take 17 g by mouth daily as needed.     POTASSIUM PO Take by mouth. Takes 5x weekly.     Probiotic Product (PROBIOTIC DAILY PO) Take by mouth daily.     prochlorperazine (COMPAZINE) 10 MG tablet Take 1 tablet (10 mg total) by mouth 2 (two) times daily as needed for nausea or vomiting. 180 tablet 0   propranolol ER (INDERAL LA) 80 MG 24 hr capsule Take 1 capsule (80 mg total) by mouth 2 (two) times daily. 180 capsule 0   pyridostigmine (MESTINON) 60 MG tablet Take 0.5 tablets (30 mg total) by mouth 2 (two) times daily. 90 tablet 0   RABEprazole (ACIPHEX) 20 MG  tablet TAKE 1 TABLET BY MOUTH TWICE A DAY 180 tablet 3   sucralfate (CARAFATE) 1 g tablet Take 1 g by mouth 4 (four) times daily as needed.      testosterone cypionate (DEPOTESTOSTERONE CYPIONATE) 200 MG/ML injection Inject 80 mg into the muscle every 14 (fourteen) days.     valACYclovir (VALTREX) 1000 MG tablet  Take 2 tabs p.o. and repeat in 12 hours as needed for cold sore 30 tablet 0   No current facility-administered medications for this visit.    Allergies  Allergen Reactions   Ace Inhibitors     Other reaction(s): Cough   Fluoxetine Anxiety    "made me fall asleep" per pt "bad headaches and "makes  Me  Crazy" historical allergy noted in [employer] "made me fall asleep" per pt "bad headaches and "makes  Me  Crazy" Per New Patient Packet.     Metoclopramide     Other reaction(s): Other (See Comments), Other (See Comments), Unknown Tardive Dyskinesia  historical allergy noted in [employer] Tardive Dyskinesia  Per New Patient Packet.   Nalbuphine     Used Post Back surgery- Anesthesiologist Error. Patient had Narcotic Withdraw. Per New Patient Packet.    Other     Other reaction(s): Other (See Comments) Altered mental status in combo with narcotics at previous hospitalization - Full Withdrawal Symptoms Other reaction(s): Rash   Amoxicillin-Pot Clavulanate Nausea Only    Per New Patient Packet.   Doxazosin Rash    Other reaction(s): Other - See Comments, Rash UNKNOWN REACTION UNKNOWN REACTION    Duloxetine Nausea Only    Per New Patient Packet.    Penicillins Nausea Only    Per New Patient Packet.   Tamsulosin Itching and Anxiety    Restless, Flushing, Heavy Chest, Itching, Hyperactive mood and Anxiety. Unable to handle side effects. Per New Patient Packet.     Trazodone And Nefazodone Itching, Anxiety and Rash    Headache. "INCREASED MY ANXIETY AND HEARTRATE" Flushing, tachycardia "INCREASED MY ANXIETY AND HEARTRATE" Per New Patient Packet.    Amlodipine      Shaking, unsure of reaction. Per New Patient Packet.     Cinoxacin     GI Intolerance, and Dizziness. Per New Patient Packet.    Ciprofloxacin     Other reaction(s): Unknown   Fludrocortisone Other (See Comments)    "Worsening headaches, GI issues, fatigue"   Nebivolol     Other reaction(s): Unknown   Olanzapine     Headache and unable to sleep for 3 nights. Per New Patient Packet.    Olmesartan     Other reaction(s): Unknown   Pregabalin     Confusion, Lack of concentration, dizziness, and likely drowsiness. Per New Patient Packet.     Prostaglandins     Other reaction(s): Other (See Comments) Intolerance   Thyroid Hormones     Other reaction(s): Other (See Comments) Thyroid (Nature Thyroid) contraindicated with some of your other medications.   Zolpidem     Nightmares, Ineffective after 2 days. Per New Patient Packet.    Duloxetine Hcl     Other reaction(s): Rash   Fluoxetine Hcl     Other reaction(s): Rash   Nucynta [Tapentadol] Other (See Comments)    Vertigo    Phenytoin Anxiety    Hyperactivity, and Ineffective. Per New Patient Packet.     Family History  Problem Relation Age of Onset   Heart disease Father    Heart failure Father    Hypertension Father    Stroke Father    Stroke Mother    Dementia Mother    Kidney Stones Daughter    Anxiety disorder Daughter    OCD Daughter     Social History   Socioeconomic History   Marital status: Married    Spouse name: Not on file   Number of children: Not on file  Years of education: Not on file   Highest education level: Not on file  Occupational History   Not on file  Tobacco Use   Smoking status: Never    Passive exposure: Never   Smokeless tobacco: Never  Vaping Use   Vaping Use: Never used  Substance and Sexual Activity   Alcohol use: Yes    Comment: 1 Drink a Month, socially   Drug use: Not Currently   Sexual activity: Yes    Birth control/protection: None  Other Topics Concern   Not on file   Social History Narrative   Tobacco use, amount per day now: None   Past tobacco use, amount per day: None   How many years did you use tobacco: 0   Alcohol use (drinks per week): 0-1 Month   Diet:   Do you drink/eat things with caffeine: Occasionally ( Hot Chocolate and maybe 1/4 of 16oz Pepsi 2-3 times a week.   Marital status: Married                                  What year were you married? 1970   Do you live in a house, apartment, assisted living, condo, trailer, etc.? House   Is it one or more stories? One   How many persons live in your home? 2   Do you have pets in your home?( please list)  No   Highest Level of education completed: Masters   Current or past profession: Intensive Transport planner for [employer]   Do you exercise? Yes                                    Type and how often? [place], Recumbent Bike, 3 times a week. Try to get a 1.2 mile walk at least 3 times a week or more.     Do you have a living will? Yes   Do you have a DNR form?  No                                 If not, do you want to discuss one?   Do you have signed POA/HPOA forms? Yes                       If so, please bring to you appointment    Do you have any difficulty bathing or dressing yourself? No    Do you have difficulty preparing food or eating? No   Do you have difficulty managing your medications? No   Do you have any difficulty managing your finances? No   Do you have any difficulty affording your medications? No         Social Determinants of Radio broadcast assistant Strain: Not on file  Food Insecurity: Not on file  Transportation Needs: Not on file  Physical Activity: Not on file  Stress: Not on file  Social Connections: Not on file  Intimate Partner Violence: Not on file     Constitutional: Patient reports chronic fatigue, frequent headaches.  Denies fever, malaise, or abrupt weight changes.  HEENT: Denies eye pain, eye redness, ear pain, ringing in the  ears, wax buildup, runny nose, nasal congestion, bloody nose, or sore throat. Respiratory:  Pt reports intermittent shortness of breath. Denies difficulty breathing, cough or sputum production.   Cardiovascular: Denies chest pain, chest tightness, palpitations or swelling in the hands or feet.  Gastrointestinal: Patient reports intermittent abdominal cramping, constipation and diarrhea.  Denies abdominal pain, bloating, or blood in the stool.  GU: Patient reports urinary frequency.  Denies urgency, pain with urination, burning sensation, blood in urine, odor or discharge. Musculoskeletal: Patient reports chronic joint pain, difficulty with gait.  Denies decrease in range of motion, muscle pain or joint swelling.  Skin: Denies redness, rashes, lesions or ulcercations.  Neurological: Patient reports neuropathic pain, restless legs and tremor.  Denies dizziness, difficulty with memory, difficulty with speech or problems with balance and coordination.  Psych: Patient has a history of anxiety and depression.  Denies SI/HI.  No other specific complaints in a complete review of systems (except as listed in HPI above).  Objective:   Physical Exam  BP 134/78 (BP Location: Right Arm, Patient Position: Sitting, Cuff Size: Normal)   Pulse 65   Temp (!) 96.4 F (35.8 C) (Temporal)   Wt 141 lb (64 kg)   SpO2 98%   BMI 20.82 kg/m   Wt Readings from Last 3 Encounters:  11/19/22 139 lb 8 oz (63.3 kg)  11/02/22 138 lb (62.6 kg)  10/28/22 139 lb (63 kg)    General: Appears his stated age, well developed, well nourished in NAD. Skin: Dressing to left ear s/p MOHS surgery for basal cell carcinoma. Bruising noted to right upper arm. HEENT: Head: normal shape and size; Eyes: sclera white, no icterus, conjunctiva pink, PERRLA and EOMs intact;  Neck:  Neck supple, trachea midline. No masses, lumps or thyromegaly present.  Cardiovascular: Normal rate and rhythm. S1,S2 noted.  No murmur, rubs or gallops noted.  No JVD or BLE edema. No carotid bruits noted. Pulmonary/Chest: Normal effort and positive vesicular breath sounds. No respiratory distress. No wheezes, rales or ronchi noted.  Abdomen: Soft and nontender. Normal bowel sounds.  Musculoskeletal: Gait slow and steady without device. Neurological: Alert and oriented. Cranial nerves II-XII grossly intact. Coordination normal but resting tremor noted in bilateral hands.  Psychiatric: Mood and affect normal. Behavior is normal. Judgment and thought content normal.    BMET    Component Value Date/Time   NA 143 09/18/2022 1025   NA 146 (H) 12/26/2021 1524   K 4.9 09/18/2022 1025   CL 104 09/18/2022 1025   CO2 32 09/18/2022 1025   GLUCOSE 88 09/18/2022 1025   BUN 11 09/18/2022 1025   BUN 14 12/26/2021 1524   CREATININE 0.76 09/18/2022 1025   CALCIUM 9.5 09/18/2022 1025   GFRNONAA >60 08/20/2022 0622   GFRAA 101 05/20/2020 0000    Lipid Panel     Component Value Date/Time   CHOL 105 09/24/2022 1445   TRIG 110 09/24/2022 1445   HDL 49 09/24/2022 1445   CHOLHDL 2.1 09/24/2022 1445   VLDL 22 09/24/2022 1445   LDLCALC 34 09/24/2022 1445   LDLCALC 103 (H) 11/11/2021 1006    CBC    Component Value Date/Time   WBC 6.0 09/18/2022 1025   RBC 5.43 09/18/2022 1025   HGB 16.1 09/18/2022 1025   HGB 14.4 12/26/2021 1524   HCT 48.8 09/18/2022 1025   HCT 43.5 12/26/2021 1524   PLT 264 09/18/2022 1025   PLT 202 12/26/2021 1524   MCV 89.9 09/18/2022 1025   MCV 90 12/26/2021 1524   MCH 29.7 09/18/2022 1025   MCHC 33.0 09/18/2022  1025   RDW 13.5 09/18/2022 1025   RDW 13.3 12/26/2021 1524   LYMPHSABS 1.2 04/19/2022 0952   MONOABS 1.2 (H) 04/19/2022 0952   EOSABS 0.0 04/19/2022 0952   BASOSABS 0.0 04/19/2022 0952    Hgb A1C Lab Results  Component Value Date   HGBA1C 5.1 10/17/2019           Assessment & Plan:     RTC in 6 months for follow-up of chronic conditions Webb Silversmith, NP

## 2022-11-20 NOTE — Assessment & Plan Note (Addendum)
Continue hydrocodone per pain management

## 2022-11-20 NOTE — Assessment & Plan Note (Signed)
-  Continue propranolol 

## 2022-11-20 NOTE — Assessment & Plan Note (Signed)
Continue hydrocodone per pain management

## 2022-11-20 NOTE — Assessment & Plan Note (Signed)
Currently not medicated 

## 2022-11-20 NOTE — Patient Instructions (Signed)
Form - Headache Record ?There are many types and causes of headaches. A headache record can help guide your treatment plan. Use this form to record the details. Bring this form with you to your follow-up visits. ?Follow your health care provider's instructions on how to describe your headache. You may be asked to: ?Use a pain scale. This is a tool to rate the intensity of your headache using words or numbers. ?Describe what your headache feels like, such as dull, achy, throbbing, or sharp. ?Headache record ?Date: _______________ Time (from start to end): ____________________ Location of the headache: _________________________ ?Intensity of the headache: ____________________ Description of the headache: ______________________________________________________________ ?Hours of sleep the night before the headache: __________ ?Food or drinks before the headache started: ______________________________________________________________________________________ ?Events before the headache started: _______________________________________________________________________________________________ ?Symptoms before the headache started: __________________________________________________________________________________________ ?Symptoms during the headache: __________________________________________________________________________________________________ ?Treatment: ________________________________________________________________________________________________________________ ?Effect of treatment: _________________________________________________________________________________________________________ ?Other comments: ___________________________________________________________________________________________________________ ?Date: _______________ Time (from start to end): ____________________ Location of the headache: _________________________ ?Intensity of the headache: ____________________ Description of the headache:  ______________________________________________________________ ?Hours of sleep the night before the headache: __________ ?Food or drinks before the headache started: ______________________________________________________________________________________ ?Events before the headache started: ____________________________________________________________________________________________ ?Symptoms before the headache started: _________________________________________________________________________________________ ?Symptoms during the headache: _______________________________________________________________________________________________ ?Treatment: ________________________________________________________________________________________________________________ ?Effect of treatment: _________________________________________________________________________________________________________ ?Other comments: ___________________________________________________________________________________________________________ ?Date: _______________ Time (from start to end): ____________________ Location of the headache: _________________________ ?Intensity of the headache: ____________________ Description of the headache: ______________________________________________________________ ?Hours of sleep the night before the headache: __________ ?Food or drinks before the headache started: ______________________________________________________________________________________ ?Events before the headache started: ____________________________________________________________________________________________ ?Symptoms before the headache started: _________________________________________________________________________________________ ?Symptoms during the headache: _______________________________________________________________________________________________ ?Treatment:  ________________________________________________________________________________________________________________ ?Effect of treatment: _________________________________________________________________________________________________________ ?Other comments: ___________________________________________________________________________________________________________ ?Date: _______________ Time (from start to end): ____________________ Location of the headache: _________________________ ?Intensity of the headache: ____________________ Description of the headache: ______________________________________________________________ ?Hours of sleep the night before the headache: _________ ?Food or drinks before the headache started: ______________________________________________________________________________________ ?Events before the headache started: ____________________________________________________________________________________________ ?Symptoms before the headache started: _________________________________________________________________________________________ ?Symptoms during the headache: _______________________________________________________________________________________________ ?Treatment: ________________________________________________________________________________________________________________ ?Effect of treatment: _________________________________________________________________________________________________________ ?Other comments: ___________________________________________________________________________________________________________ ?Date: _______________ Time (from start to end): ____________________ Location of the headache: _________________________ ?Intensity of the headache: ____________________ Description of the headache: ______________________________________________________________ ?Hours of sleep the night before the headache: _________ ?Food or drinks before the headache started:  ______________________________________________________________________________________ ?Events before the headache started: ____________________________________________________________________________________________ ?Symptoms before the headache started: _________________________________________________________________________________________ ?Symptoms during the headache: _______________________________________________________________________________________________ ?Treatment: ________________________________________________________________________________________________________________ ?Effect of treatment: _________________________________________________________________________________________________________ ?Other comments: ___________________________________________________________________________________________________________ ?This information is not intended to replace advice given to you by your health care provider. Make sure you discuss any questions you have with your health care provider. ?Document Revised: 01/29/2021 Document Reviewed: 01/29/2021 ?Elsevier Patient Education ? 2023 [employer] ? ?

## 2022-11-20 NOTE — Assessment & Plan Note (Signed)
No angina Lipid profile reviewed Continue ezetimibe, propranolol, aspirin and Plavix He is not currently taking Repatha but is considering revisiting this in the future Encourage low-fat diet

## 2022-11-20 NOTE — Assessment & Plan Note (Signed)
Lipid profile reviewed Encouraged him to consume low-fat diet Continue ezetimibe He is not currently taking Repatha but will reconsider this in the future

## 2022-11-20 NOTE — Assessment & Plan Note (Signed)
Continue nortriptyline, propranolol and Fioricet He will continue to follow with neurology

## 2022-11-20 NOTE — Assessment & Plan Note (Signed)
Continue rabeprazole and Carafate

## 2022-11-21 ENCOUNTER — Other Ambulatory Visit: Payer: Self-pay | Admitting: Internal Medicine

## 2022-11-21 ENCOUNTER — Encounter: Payer: Self-pay | Admitting: Internal Medicine

## 2022-11-23 MED ORDER — DIAZEPAM 5 MG PO TABS: ORAL_TABLET | ORAL | 0 refills | Status: DC

## 2022-11-23 NOTE — Telephone Encounter (Signed)
Requested medication (s) are due for refill today: yes  Requested medication (s) are on the active medication list: yes  Last refill:  10/19/22 #90   Future visit scheduled: no  Notes to clinic:  med not delegated to NT    Requested Prescriptions  Pending Prescriptions Disp Refills   diazepam (VALIUM) 5 MG tablet [Pharmacy Med Name: DIAZEPAM 5 MG TABLET] 90 tablet 0    Sig: TAKE 1 TABLET IN THE MORNING AND 2 TABLETS AT BEDTIME     Not Delegated - Psychiatry: Anxiolytics/Hypnotics 2 Failed - 11/21/2022  2:12 PM      Failed - This refill cannot be delegated      Failed - Urine Drug Screen completed in last 360 days      Passed - Patient is not pregnant      Passed - Valid encounter within last 6 months    Recent Outpatient Visits           3 days ago Coronary artery disease involving native coronary artery of native heart without angina pectoris   [facility] [place], [name], NP   4 weeks ago Essential hypertension   [place], Grayland Ormond A, RPH-CPP   2 months ago Generalized abdominal pain   [facility] [place], [name], NP   3 months ago Atypical pneumonia   [facility] [place], [name], NP   4 months ago Essential hypertension   [place], RPH-CPP       Future Appointments             In 1 month Ellyn Hack, Leonie Green, MD [place] [facility]   In 5 months [facility] [place], Herbert Seta, MD [place]   In 6 months [place], [name], NP [facility], [facility]

## 2022-12-03 ENCOUNTER — Ambulatory Visit (INDEPENDENT_AMBULATORY_CARE_PROVIDER_SITE_OTHER): Payer: Medicare Other

## 2022-12-03 VITALS — Ht 69.0 in | Wt 141.0 lb

## 2022-12-03 DIAGNOSIS — Z0000 Encounter for general adult medical examination without abnormal findings: Secondary | ICD-10-CM

## 2022-12-03 NOTE — Progress Notes (Signed)
I connected with  Grant Young on 12/03/22 by a audio enabled telemedicine application and verified that I am speaking with the correct person using two identifiers.  Patient Location: Home  Provider Location: Office/Clinic  I discussed the limitations of evaluation and management by telemedicine. The patient expressed understanding and agreed to proceed.  Subjective:   Grant Young is a 77 y.o. male who presents for Medicare Annual/Subsequent preventive examination.  Review of Systems     Cardiac Risk Factors include: advanced age (>55men, >46 women);male gender;dyslipidemia;hypertension     Objective:    There were no vitals filed for this visit. There is no height or weight on file to calculate BMI.     12/03/2022    2:07 PM 08/18/2022   11:22 AM 05/26/2022   10:22 AM 04/19/2022    5:00 PM 04/19/2022   10:09 AM 12/08/2021    2:25 PM 11/28/2021    2:00 AM  Advanced Directives  Does Patient Have a Medical Advance Directive? Yes No Yes Yes Yes Yes Yes  Type of Paramedic of [place];Living will  [place];Living will [place];Living will [place];Living will Living will Living will  Does patient want to make changes to medical advance directive? No - Patient declined   No - Patient declined  No - Patient declined No - Patient declined  Copy of [place] in Chart? Yes - validated most recent copy scanned in chart (See row information)   No - copy requested     Would patient like information on creating a medical advance directive?  No - Patient declined     No - Patient declined    Current Medications (verified) Outpatient Encounter Medications as of 12/03/2022  Medication Sig   anastrozole (ARIMIDEX) 1 MG tablet 1/2 tablet (0.5 mg) by mouth once weekly   butalbital-acetaminophen-caffeine (FIORICET) 50-325-40 MG tablet TAKE 1 TABLET BY MOUTH EVERY 6 HOURS AS NEEDED FOR HEADACHE.    cetirizine (ZYRTEC) 10 MG tablet Take 10 mg by mouth daily as needed for allergies.   Cholecalciferol 50 MCG (2000 UT) CAPS Take 1 tablet by mouth daily.   clopidogrel (PLAVIX) 75 MG tablet Take 1 tablet (75 mg) by mouth once daily   Cyanocobalamin (VITAMIN B-12) 5000 MCG LOZG Take 1 lozenge by mouth daily.    DHEA 25 MG CAPS Take 25 mg by mouth daily.   diazepam (VALIUM) 5 MG tablet TAKE 1 TABLET IN THE MORNING AND 2 TABLETS AT BEDTIME   dicyclomine (BENTYL) 10 MG capsule Take 1 capsule (10 mg total) by mouth 4 (four) times daily -  before meals and at bedtime.   Digestive Aids Mixture (DIGESTION GB PO) Take 1 capsule by mouth daily.   Evolocumab (REPATHA SURECLICK) [id number] MG/ML SOAJ Inject 140 mg into the skin every 14 (fourteen) days.   ezetimibe (ZETIA) 10 MG tablet Take 1 tablet (10 mg total) by mouth daily.   ferrous sulfate 325 (65 FE) MG tablet Take 325 mg by mouth. Once daily on Mondays, Wednesdays and Fridays   ipratropium (ATROVENT) 0.06 % nasal spray Place 1 spray into both nostrils 4 (four) times daily as needed for rhinitis. For up to 5-7 days then stop.   Loperamide HCl (IMODIUM PO) Take by mouth as needed.    MAGNESIUM BISGLYCINATE PO Take by mouth daily.   melatonin 5 MG TABS Take 5 mg by mouth at bedtime.   NONFORMULARY OR COMPOUNDED ITEM  Bi-Mix Papaverine 30mg , Phentolamine 1mg    Dosage: Inject 1cc per injection    Vial 43ml   Qty #5 Refills 6   nortriptyline (PAMELOR) 10 MG capsule Take 10 mg in the morning and 30 mg at night and continue that dose   Nutritional Supplements (NUTRITIONAL SUPPLEMENT PO) Take 1 capsule by mouth daily. [name]   polyethylene glycol (MIRALAX / GLYCOLAX) 17 g packet Take 17 g by mouth daily as needed.   POTASSIUM PO Take by mouth. Takes 5x weekly.   Probiotic Product (PROBIOTIC DAILY PO) Take by mouth daily.   prochlorperazine (COMPAZINE) 10 MG tablet Take 1 tablet (10 mg total) by mouth 2 (two) times daily as needed for  nausea or vomiting.   propranolol ER (INDERAL LA) 80 MG 24 hr capsule Take 1 capsule (80 mg total) by mouth 2 (two) times daily.   pyridostigmine (MESTINON) 60 MG tablet Take 0.5 tablets (30 mg total) by mouth 2 (two) times daily.   RABEprazole (ACIPHEX) 20 MG tablet TAKE 1 TABLET BY MOUTH TWICE A DAY   sucralfate (CARAFATE) 1 g tablet Take 1 g by mouth 4 (four) times daily as needed.    testosterone cypionate (DEPOTESTOSTERONE CYPIONATE) 200 MG/ML injection Inject 80 mg into the muscle every 14 (fourteen) days.   valACYclovir (VALTREX) 1000 MG tablet Take 2 tabs p.o. and repeat in 12 hours as needed for cold sore   venlafaxine XR (EFFEXOR XR) 75 MG 24 hr capsule Take 1 capsule (75 mg total) by mouth daily with breakfast.   No facility-administered encounter medications on file as of 12/03/2022.    Allergies (verified) Ace inhibitors, Fluoxetine, Metoclopramide, Nalbuphine, Other, Amoxicillin-pot clavulanate, Doxazosin, Duloxetine, Penicillins, Tamsulosin, Trazodone and nefazodone, Amlodipine, Cinoxacin, Ciprofloxacin, Fludrocortisone, Nebivolol, Olanzapine, Olmesartan, Pregabalin, Prostaglandins, Thyroid hormones, Zolpidem, Duloxetine hcl, Fluoxetine hcl, Nucynta [tapentadol], and Phenytoin   History: Past Medical History:  Diagnosis Date   Anxiety    Per New Patient Packet   Chronic back pain    Per New Patient Packet   Chronic heart disease    Per New Patient Packet   Depression    GERD (gastroesophageal reflux disease)    Headache    History of gastritis    Per New Patient Packet   History of neuropathy    Per New Patient Packet   Hypertension    Per New Patient Packet   Insomnia    Kidney stone    Lumbar radicular pain    Per New Patient Packet   Lyme disease    Per New Patient Packet   Myocardial infarction [facility])    Overactive bladder    PONV (postoperative nausea and vomiting)    Skin cancer, basal cell    Spinal cord stimulator status    01/08/21 - not currently  using.   Squamous cell skin cancer    Substance abuse [facility])    Past Surgical History:  Procedure Laterality Date   BACK SURGERY     BASAL CELL CARCINOMA EXCISION     CARDIAC CATHETERIZATION     CATARACT EXTRACTION W/PHACO Left 01/14/2021   Procedure: CATARACT EXTRACTION PHACO AND INTRAOCULAR LENS PLACEMENT ([place]) LEFT VIVITY TORIC LENS 8.75 00:56.7;  Surgeon: Birder Robson, MD;  Location: [place];  Service: Ophthalmology;  Laterality: Left;   CATARACT EXTRACTION W/PHACO Right 01/28/2021   Procedure: CATARACT EXTRACTION PHACO AND INTRAOCULAR LENS PLACEMENT ([place]) RIGHT VIVITY TORIC LENS;  Surgeon: Birder Robson, MD;  Location: [place];  Service: Ophthalmology;  Laterality:  Right;  6.54 00:46.4   CHOLECYSTECTOMY  2017   COLONOSCOPY  09/15/2015   Per New Patient Packet   COLONOSCOPY WITH PROPOFOL N/A 10/03/2020   Procedure: COLONOSCOPY WITH PROPOFOL;  Surgeon: [name], MD;  Location[facility] ENDOSCOPY;  Service: Gastroenterology;  Laterality: N/A;   CORONARY/GRAFT ACUTE MI REVASCULARIZATION N/A 11/27/2021   Procedure: Coronary/Graft Acute MI Revascularization;  Surgeon: Leonie Man, MD;  Location: [place] CV LAB;  Service: Cardiovascular;  Laterality: N/A;   ESOPHAGOGASTRODUODENOSCOPY (EGD) WITH PROPOFOL N/A 10/03/2020   Procedure: ESOPHAGOGASTRODUODENOSCOPY (EGD) WITH PROPOFOL;  Surgeon: [name], MD;  Location: [facility]COPY;  Service: Gastroenterology;  Laterality: N/A;   FOOT SURGERY Bilateral 03/18/2020   FOOT SURGERY  08/032021   GALLBLADDER SURGERY  09/15/2015   Gallbladder Removal. Procedure done by Dr.Beverly. Per New Patient Packet   KIDNEY STONE SURGERY  09/14/1980   Too many to count. Per New Patient Packet 09/14/1980-09/15/1995   KIDNEY STONE SURGERY  09/15/1995   Too many to count. Per New Patient Packet   LEFT HEART CATH AND CORONARY ANGIOGRAPHY N/A 11/27/2021   Procedure: LEFT HEART CATH AND CORONARY ANGIOGRAPHY;  Surgeon:  Leonie Man, MD;  Location: [name] CV LAB;  Service: Cardiovascular;  Laterality: N/A;   LITHOTRIPSY  09/14/2014   Per New Patient Packet   LITHOTRIPSY  09/14/1996   No Stints Used. Per New Patient Packet   LITHOTRIPSY  09/14/1997   No Stints used. Per New Patient Packet   PAIN PUMP IMPLANTATION     PAIN PUMP REMOVAL     SHOULDER SURGERY Right 1984   SIGMOIDOSCOPY  09/14/2017   Per New Patient Packet   SPINAL CORD STIMULATOR IMPLANT     TONSILLECTOMY     UPPER GI ENDOSCOPY     Family History  Problem Relation Age of Onset   Heart disease Father    Heart failure Father    Hypertension Father    Stroke Father    Stroke Mother    Dementia Mother    Kidney Stones Daughter    Anxiety disorder Daughter    OCD Daughter    Social History   Socioeconomic History   Marital status: Married    Spouse name: Not on file   Number of children: Not on file   Years of education: Not on file   Highest education level: Not on file  Occupational History   Not on file  Tobacco Use   Smoking status: Never    Passive exposure: Never   Smokeless tobacco: Never  Vaping Use   Vaping Use: Never used  Substance and Sexual Activity   Alcohol use: Yes    Comment: 1 Drink a Month, socially   Drug use: Not Currently   Sexual activity: Yes    Birth control/protection: None  Other Topics Concern   Not on file  Social History Narrative   Tobacco use, amount per day now: None   Past tobacco use, amount per day: None   How many years did you use tobacco: 0   Alcohol use (drinks per week): 0-1 Month   Diet:   Do you drink/eat things with caffeine: Occasionally ( Hot Chocolate and maybe 1/4 of 16oz Pepsi 2-3 times a week.   Marital status: Married                                  What year were you married? 1970  Do you live in a house, apartment, assisted living, condo, trailer, etc.? House   Is it one or more stories? One   How many persons live in your home? 2   Do you have  pets in your home?( please list)  No   Highest Level of education completed: Masters   Current or past profession: Intensive Transport planner for [employer]   Do you exercise? Yes                                    Type and how often? [place], Recumbent Bike, 3 times a week. Try to get a 1.2 mile walk at least 3 times a week or more.     Do you have a living will? Yes   Do you have a DNR form?  No                                 If not, do you want to discuss one?   Do you have signed POA/HPOA forms? Yes                       If so, please bring to you appointment    Do you have any difficulty bathing or dressing yourself? No    Do you have difficulty preparing food or eating? No   Do you have difficulty managing your medications? No   Do you have any difficulty managing your finances? No   Do you have any difficulty affording your medications? No         Social Determinants of Health   Financial Resource Strain: Low Risk  (12/03/2022)   Overall Financial Resource Strain (CARDIA)    Difficulty of Paying Living Expenses: Not hard at all  Food Insecurity: No Food Insecurity (12/03/2022)   Hunger Vital Sign    Worried About Running Out of Food in the Last Year: Never true    Ran Out of Food in the Last Year: Never true  Transportation Needs: No Transportation Needs (12/03/2022)   PRAPARE - Hydrologist (Medical): No    Lack of Transportation (Non-Medical): No  Physical Activity: Insufficiently Active (12/03/2022)   Exercise Vital Sign    Days of Exercise per Week: 3 days    Minutes of Exercise per Session: 30 min  Stress: No Stress Concern Present (12/03/2022)   [place]    Feeling of Stress : Not at all  Social Connections: Moderately Integrated (12/03/2022)   Social Connection and Isolation Panel [NHANES]    Frequency of Communication with Friends and Family: Twice a week     Frequency of Social Gatherings with Friends and Family: More than three times a week    Attends Religious Services: More than 4 times per year    Active Member of [employer] or Organizations: No    Attends Music therapist: Never    Marital Status: Married    Tobacco Counseling Counseling given: Not Answered   Clinical Intake:  Pre-visit preparation completed: Yes  Pain : No/denies pain     Nutritional Risks: None Diabetes: No  How often do you need to have someone help you when you read instructions, pamphlets, or other written materials from your doctor or pharmacy?: 1 -  Never  Diabetic?no  Interpreter Needed?: No  Information entered by :: Lennox Solders, LPN   Activities of Daily Living    12/03/2022    2:08 PM 08/18/2022    6:00 PM  In your present state of health, do you have any difficulty performing the following activities:  Hearing? 0 0  Vision? 0 0  Difficulty concentrating or making decisions? 0 0  Walking or climbing stairs? 0 0  Dressing or bathing? 0 0  Doing errands, shopping? 0 0  Preparing Food and eating ? N   Using the Toilet? N   In the past six months, have you accidently leaked urine? N   Do you have problems with loss of bowel control? N   Managing your Medications? N   Managing your Finances? N   Housekeeping or managing your Housekeeping? N     Patient Care Team: [name], NP as PCP - General (Internal Medicine) Leonie Man, MD as PCP - Cardiology (Cardiology) Mhoon, Larkin Ina, MD (Psychiatry) Dermatology, Kara Mead, MD as Consulting Physician (Gastroenterology) Curley Spice, Virl Diamond, RPH-CPP (Pharmacist)  Indicate any recent Medical Services you may have received from other than [facility] providers in the past year (date may be approximate).     Assessment:   This is a routine wellness examination for [place].  Hearing/Vision screen Hearing Screening - Comments:: No aids Vision Screening - Comments::  Readers- [facility] Dr.Porfilio  Dietary issues and exercise activities discussed: Current Exercise Habits: Home exercise routine, Type of exercise: walking, Time (Minutes): 30, Frequency (Times/Week): 3, Weekly Exercise (Minutes/Week): 90, Intensity: Mild   Goals Addressed             This Visit's Progress    DIET - EAT MORE FRUITS AND VEGETABLES         Depression Screen    12/03/2022    2:05 PM 05/26/2022   10:22 AM 03/12/2022    2:29 PM 03/04/2022   11:26 AM 02/11/2022   11:24 AM 01/16/2022   11:27 AM 12/18/2021    4:20 PM  PHQ 2/9 Scores  PHQ - 2 Score 0 0 3 2 2 3 4   PHQ- 9 Score 0  12 14 10 12 14     Fall Risk    12/03/2022    2:08 PM 09/29/2022   10:19 AM 07/30/2022   10:50 AM 05/26/2022   10:22 AM 02/19/2022   11:42 AM  Fall Risk   Falls in the past year? 1 0 0 0 0  Number falls in past yr: 0      Injury with Fall? 0      Risk for fall due to : History of fall(s)      Follow up Falls prevention discussed;Falls evaluation completed        FALL RISK PREVENTION PERTAINING TO THE HOME:  Any stairs in or around the home? No  If so, are there any without handrails? No  Home free of loose throw rugs in walkways, pet beds, electrical cords, etc? Yes  Adequate lighting in your home to reduce risk of falls? Yes   ASSISTIVE DEVICES UTILIZED TO PREVENT FALLS:  Life alert? No  Use of a cane, walker or w/c? No  Grab bars in the bathroom? Yes  Shower chair or bench in shower? Yes  Elevated toilet seat or a handicapped toilet? Yes    Cognitive Function:        12/03/2022    2:13 PM 12/19/2019    2:20 PM  6CIT Screen  What Year? 0 points 0 points  What month? 0 points 0 points  What time? 0 points 0 points  Count back from 20 0 points 0 points  Months in reverse 0 points 2 points  Repeat phrase 2 points 2 points  Total Score 2 points 4 points    Immunizations Immunization History  Administered Date(s) Administered   Influenza Nasal 07/31/2004, 05/21/2010,  07/06/2014, 07/03/2015   Influenza-Unspecified 09/15/2015, 09/14/2016, 06/15/2018   Moderna Sars-Covid-2 Vaccination 10/02/2019, 10/26/2019, 07/30/2020   Pneumococcal Conjugate-13 09/16/2005, 12/20/2013   Pneumococcal Polysaccharide-23 02/16/2017   Tdap 03/02/2005, 07/30/2020   Zoster Recombinat (Shingrix) 03/14/2017, 06/14/2018   Zoster, Live 09/15/2015    TDAP status: Up to date  Flu Vaccine status: Declined, Education has been provided regarding the importance of this vaccine but patient still declined. Advised may receive this vaccine at local pharmacy or Health Dept. Aware to provide a copy of the vaccination record if obtained from local pharmacy or Health Dept. Verbalized acceptance and understanding.  Pneumococcal vaccine status: Up to date  Covid-19 vaccine status: Completed vaccines  Qualifies for Shingles Vaccine? Yes   Zostavax completed Yes   Shingrix Completed?: Yes  Screening Tests Health Maintenance  Topic Date Due   COVID-19 Vaccine (4 - 2023-24 season) 05/15/2022   INFLUENZA VACCINE  04/15/2023 (Originally 04/14/2022)   COLONOSCOPY (Pts 45-6yrs Insurance coverage will need to be confirmed)  10/04/2023   Medicare Annual Wellness (AWV)  12/03/2023   DTaP/Tdap/Td (3 - Td or Tdap) 07/30/2030   Pneumonia Vaccine 54+ Years old  Completed   Hepatitis C Screening  Completed   Zoster Vaccines- Shingrix  Completed   HPV VACCINES  Aged Out    Health Maintenance  Health Maintenance Due  Topic Date Due   COVID-19 Vaccine (4 - 2023-24 season) 05/15/2022    Colorectal cancer screening: Type of screening: Colonoscopy. Completed 10/05/20. Repeat every 3 years  Lung Cancer Screening: (Low Dose CT Chest recommended if Age 75-80 years, 30 pack-year currently smoking OR have quit w/in 15years.) does not qualify.   Additional Screening:  Hepatitis C Screening: does qualify; Completed 09/16/20  Vision Screening: Recommended annual ophthalmology exams for early detection of  glaucoma and other disorders of the eye. Is the patient up to date with their annual eye exam?  Yes  Who is the provider or what is the name of the office in which the patient attends annual eye exams? [facility] If pt is not established with a provider, would they like to be referred to a provider to establish care? No .   Dental Screening: Recommended annual dental exams for proper oral hygiene  Community Resource Referral / Chronic Care Management: CRR required this visit?  No   CCM required this visit?  No      Plan:     I have personally reviewed and noted the following in the patient's chart:   Medical and social history Use of alcohol, tobacco or illicit drugs  Current medications and supplements including opioid prescriptions. Patient is not currently taking opioid prescriptions. Functional ability and status Nutritional status Physical activity Advanced directives List of other physicians Hospitalizations, surgeries, and ER visits in previous 12 months Vitals Screenings to include cognitive, depression, and falls Referrals and appointments  In addition, I have reviewed and discussed with patient certain preventive protocols, quality metrics, and best practice recommendations. A written personalized care plan for preventive services as well as general preventive health recommendations were provided to patient.  Dionisio David, LPN   [id number]   Nurse Notes: none

## 2022-12-03 NOTE — Patient Instructions (Signed)
Grant Young , Thank you for taking time to come for your Medicare Wellness Visit. I appreciate your ongoing commitment to your health goals. Please review the following plan we discussed and let me know if I can assist you in the future.   These are the goals we discussed:  Goals      DIET - EAT MORE FRUITS AND VEGETABLES     Increase physical activity     Increase physical activity to 3 days a week, 30 mins plus     Pharmacy Goals     Please check your home blood pressure, keep a log of the results and bring this with you to your medical appointments. Please call office sooner for readings outside of established parameters or symptoms.   Feel free to call me with any questions or concerns. I look forward to our next call!  Wallace Cullens, PharmD, [place] [phone]        This is a list of the screening recommended for you and due dates:  Health Maintenance  Topic Date Due   COVID-19 Vaccine (4 - 2023-24 season) 05/15/2022   Flu Shot  04/15/2023*   Colon Cancer Screening  10/04/2023   Medicare Annual Wellness Visit  12/03/2023   DTaP/Tdap/Td vaccine (3 - Td or Tdap) 07/30/2030   Pneumonia Vaccine  Completed   Hepatitis C Screening: USPSTF Recommendation to screen - Ages 75-75 yo.  Completed   Zoster (Shingles) Vaccine  Completed   HPV Vaccine  Aged Out  *Topic was postponed. The date shown is not the original due date.    Advanced directives: no  Conditions/risks identified: none  Next appointment: Follow up in one year for your annual wellness visit. 12/09/23 @ 2:00 pm by phone  Preventive Care 65 Years and Older, Male  Preventive care refers to lifestyle choices and visits with your health care provider that can promote health and wellness. What does preventive care include? A yearly physical exam. This is also called an annual well check. Dental exams once or twice a year. Routine eye exams. Ask your  health care provider how often you should have your eyes checked. Personal lifestyle choices, including: Daily care of your teeth and gums. Regular physical activity. Eating a healthy diet. Avoiding tobacco and drug use. Limiting alcohol use. Practicing safe sex. Taking low doses of aspirin every day. Taking vitamin and mineral supplements as recommended by your health care provider. What happens during an annual well check? The services and screenings done by your health care provider during your annual well check will depend on your age, overall health, lifestyle risk factors, and family history of disease. Counseling  Your health care provider may ask you questions about your: Alcohol use. Tobacco use. Drug use. Emotional well-being. Home and relationship well-being. Sexual activity. Eating habits. History of falls. Memory and ability to understand (cognition). Work and work Statistician. Screening  You may have the following tests or measurements: Height, weight, and BMI. Blood pressure. Lipid and cholesterol levels. These may be checked every 5 years, or more frequently if you are over 55 years old. Skin check. Lung cancer screening. You may have this screening every year starting at age 54 if you have a 30-pack-year history of smoking and currently smoke or have quit within the past 15 years. Fecal occult blood test (FOBT) of the stool. You may have this test every year starting at age 59. Flexible sigmoidoscopy or colonoscopy. You may have  a sigmoidoscopy every 5 years or a colonoscopy every 10 years starting at age 6. Prostate cancer screening. Recommendations will vary depending on your family history and other risks. Hepatitis C blood test. Hepatitis B blood test. Sexually transmitted disease (STD) testing. Diabetes screening. This is done by checking your blood sugar (glucose) after you have not eaten for a while (fasting). You may have this done every 1-3  years. Abdominal aortic aneurysm (AAA) screening. You may need this if you are a current or former smoker. Osteoporosis. You may be screened starting at age 53 if you are at high risk. Talk with your health care provider about your test results, treatment options, and if necessary, the need for more tests. Vaccines  Your health care provider may recommend certain vaccines, such as: Influenza vaccine. This is recommended every year. Tetanus, diphtheria, and acellular pertussis (Tdap, Td) vaccine. You may need a Td booster every 10 years. Zoster vaccine. You may need this after age 5. Pneumococcal 13-valent conjugate (PCV13) vaccine. One dose is recommended after age 49. Pneumococcal polysaccharide (PPSV23) vaccine. One dose is recommended after age 70. Talk to your health care provider about which screenings and vaccines you need and how often you need them. This information is not intended to replace advice given to you by your health care provider. Make sure you discuss any questions you have with your health care provider. Document Released: 09/27/2015 Document Revised: 05/20/2016 Document Reviewed: 07/02/2015 Elsevier Interactive Patient Education  2017 [place] Prevention in the Home Falls can cause injuries. They can happen to people of all ages. There are many things you can do to make your home safe and to help prevent falls. What can I do on the outside of my home? Regularly fix the edges of walkways and driveways and fix any cracks. Remove anything that might make you trip as you walk through a door, such as a raised step or threshold. Trim any bushes or trees on the path to your home. Use bright outdoor lighting. Clear any walking paths of anything that might make someone trip, such as rocks or tools. Regularly check to see if handrails are loose or broken. Make sure that both sides of any steps have handrails. Any raised decks and porches should have guardrails on the  edges. Have any leaves, snow, or ice cleared regularly. Use sand or salt on walking paths during winter. Clean up any spills in your garage right away. This includes oil or grease spills. What can I do in the bathroom? Use night lights. Install grab bars by the toilet and in the tub and shower. Do not use towel bars as grab bars. Use non-skid mats or decals in the tub or shower. If you need to sit down in the shower, use a plastic, non-slip stool. Keep the floor dry. Clean up any water that spills on the floor as soon as it happens. Remove soap buildup in the tub or shower regularly. Attach bath mats securely with double-sided non-slip rug tape. Do not have throw rugs and other things on the floor that can make you trip. What can I do in the bedroom? Use night lights. Make sure that you have a light by your bed that is easy to reach. Do not use any sheets or blankets that are too big for your bed. They should not hang down onto the floor. Have a firm chair that has side arms. You can use this for support while you get dressed. Do  not have throw rugs and other things on the floor that can make you trip. What can I do in the kitchen? Clean up any spills right away. Avoid walking on wet floors. Keep items that you use a lot in easy-to-reach places. If you need to reach something above you, use a strong step stool that has a grab bar. Keep electrical cords out of the way. Do not use floor polish or wax that makes floors slippery. If you must use wax, use non-skid floor wax. Do not have throw rugs and other things on the floor that can make you trip. What can I do with my stairs? Do not leave any items on the stairs. Make sure that there are handrails on both sides of the stairs and use them. Fix handrails that are broken or loose. Make sure that handrails are as long as the stairways. Check any carpeting to make sure that it is firmly attached to the stairs. Fix any carpet that is loose or  worn. Avoid having throw rugs at the top or bottom of the stairs. If you do have throw rugs, attach them to the floor with carpet tape. Make sure that you have a light switch at the top of the stairs and the bottom of the stairs. If you do not have them, ask someone to add them for you. What else can I do to help prevent falls? Wear shoes that: Do not have high heels. Have rubber bottoms. Are comfortable and fit you well. Are closed at the toe. Do not wear sandals. If you use a stepladder: Make sure that it is fully opened. Do not climb a closed stepladder. Make sure that both sides of the stepladder are locked into place. Ask someone to hold it for you, if possible. Clearly mark and make sure that you can see: Any grab bars or handrails. First and last steps. Where the edge of each step is. Use tools that help you move around (mobility aids) if they are needed. These include: Canes. Walkers. Scooters. Crutches. Turn on the lights when you go into a dark area. Replace any light bulbs as soon as they burn out. Set up your furniture so you have a clear path. Avoid moving your furniture around. If any of your floors are uneven, fix them. If there are any pets around you, be aware of where they are. Review your medicines with your doctor. Some medicines can make you feel dizzy. This can increase your chance of falling. Ask your doctor what other things that you can do to help prevent falls. This information is not intended to replace advice given to you by your health care provider. Make sure you discuss any questions you have with your health care provider. Document Released: 06/27/2009 Document Revised: 02/06/2016 Document Reviewed: 10/05/2014 Elsevier Interactive Patient Education  2017 [employer].

## 2022-12-07 ENCOUNTER — Ambulatory Visit
Admission: RE | Admit: 2022-12-07 | Discharge: 2022-12-07 | Disposition: A | Discharge status: Home / Self Care | Payer: Medicare Other | Source: Ambulatory Visit | Attending: Specialist | Admitting: Specialist

## 2022-12-07 DIAGNOSIS — R9389 Abnormal findings on diagnostic imaging of other specified body structures: Secondary | ICD-10-CM | POA: Diagnosis present

## 2022-12-11 ENCOUNTER — Encounter: Payer: Self-pay | Admitting: Cardiology

## 2022-12-11 NOTE — Telephone Encounter (Signed)
I saw the CT Results -- does not tell us anything that we do not already know.    Grant Young

## 2022-12-17 ENCOUNTER — Other Ambulatory Visit: Payer: Self-pay | Admitting: Cardiology

## 2022-12-22 ENCOUNTER — Encounter: Payer: Self-pay | Admitting: Internal Medicine

## 2022-12-23 ENCOUNTER — Telehealth: Payer: Self-pay | Admitting: Cardiology

## 2022-12-23 MED ORDER — DIAZEPAM 5 MG PO TABS: ORAL_TABLET | ORAL | 0 refills | Status: DC

## 2022-12-23 NOTE — Telephone Encounter (Signed)
   Pre-operative Risk Assessment    Patient Name: Grant Young  DOB: 01-01-1946 MRN: [id number]      Request for Surgical Clearance    Procedure:   EBUS / Robotic Bronchoscopy   Date of Surgery:  Clearance 01/15/23                                 Surgeon:  Dr Vida Rigger Surgeon's Group or Practice Name:  [facility] Phone number:  [phone] Fax number:  [phone]   Type of Clearance Requested:   - Pharmacy:  Hold Aspirin, Clopidogrel (Plavix), and Ticagrelor (Brilinta) instructions   Type of Anesthesia:  General    Additional requests/questions:    Courtney Heys   12/23/2022, 3:54 PM

## 2022-12-23 NOTE — Telephone Encounter (Signed)
   Name: [name]  DOB: 1945/11/02  MRN: [id number]  Primary Cardiologist: Grant Lemma, MD  Chart reviewed as part of pre-operative protocol coverage. Because of Grant Young's past medical history and time since last visit, he will require a follow-up in-office visit in order to better assess preoperative cardiovascular risk. Patient is scheduled with Dr. Herbie Young on 12/31/2022. I have updated appointment notes to reflect preop clearance.   Pre-op covering staff:  - Please contact requesting surgeon's office via preferred method (i.e, phone, fax) to inform them of need for appointment prior to surgery.   Grant Levering, NP  12/23/2022, 4:42 PM

## 2022-12-23 NOTE — Telephone Encounter (Signed)
Pt has appt with Dr. Herbie Baltimore 12/31/22. Will add need pre op clearance to appt notes.

## 2022-12-29 ENCOUNTER — Ambulatory Visit (HOSPITAL_BASED_OUTPATIENT_CLINIC_OR_DEPARTMENT_OTHER): Payer: Worker's Compensation | Admitting: Student in an Organized Health Care Education/Training Program

## 2022-12-29 ENCOUNTER — Encounter: Payer: Self-pay | Admitting: Student in an Organized Health Care Education/Training Program

## 2022-12-29 ENCOUNTER — Ambulatory Visit
Admission: RE | Admit: 2022-12-29 | Discharge: 2022-12-29 | Disposition: A | Discharge status: Home / Self Care | Payer: Worker's Compensation | Source: Ambulatory Visit | Attending: Student in an Organized Health Care Education/Training Program | Admitting: Student in an Organized Health Care Education/Training Program

## 2022-12-29 ENCOUNTER — Telehealth: Payer: Self-pay | Admitting: Cardiology

## 2022-12-29 VITALS — BP 165/85 | HR 74 | Temp 97.5°F | Resp 16 | Ht 69.0 in | Wt 142.0 lb

## 2022-12-29 DIAGNOSIS — M47816 Spondylosis without myelopathy or radiculopathy, lumbar region: Secondary | ICD-10-CM | POA: Insufficient documentation

## 2022-12-29 DIAGNOSIS — M533 Sacrococcygeal disorders, not elsewhere classified: Secondary | ICD-10-CM

## 2022-12-29 DIAGNOSIS — G894 Chronic pain syndrome: Secondary | ICD-10-CM | POA: Insufficient documentation

## 2022-12-29 DIAGNOSIS — Z9689 Presence of other specified functional implants: Secondary | ICD-10-CM

## 2022-12-29 DIAGNOSIS — G5703 Lesion of sciatic nerve, bilateral lower limbs: Secondary | ICD-10-CM | POA: Insufficient documentation

## 2022-12-29 MED ORDER — HYDROCODONE-ACETAMINOPHEN 5-325 MG PO TABS: 1.0000 | ORAL_TABLET | Freq: Two times a day (BID) | ORAL | 0 refills | Status: AC | PRN

## 2022-12-29 NOTE — Telephone Encounter (Signed)
   Pre-operative Risk Assessment    Patient Name: Grant Young  DOB: 1946-02-13 MRN: [id number]      Request for Surgical Clearance    Procedure:  EBUS Robotic bronchoscopy  Date of Surgery:  Clearance 01/15/23                                 Surgeon:  Ellin Mayhew, MD Surgeon's Group or Practice Name:  [facility] Phone number:  [phone] Fax number:  [phone]   Type of Clearance Requested:  Pharmacy Asprin  daily, Clopidogrel     Type of Anesthesia:  General    Additional requests/questions:    Signed, Narda Amber   12/29/2022, 3:39 PM

## 2022-12-29 NOTE — Progress Notes (Signed)
PROVIDER NOTE: Information contained herein reflects review and annotations entered in association with encounter. Interpretation of such information and data should be left to medically-trained personnel. Information provided to patient can be located elsewhere in the medical record under "Patient Instructions". Document created using STT-dictation technology, any transcriptional errors that may result from process are unintentional.    Patient: Grant Young  Service Category: E/M  Provider: Edward Jolly, MD  DOB: 1945-12-14  DOS: 12/29/2022  Specialty: Interventional Pain Management  MRN: [id number]  Setting: Ambulatory outpatient  PCP: Lorre Munroe, NP  Type: Established Patient    Referring Provider: Lorre Munroe, NP  Location: Office  Delivery: Face-to-face     HPI  Grant Young, a 77 y.o. year old male, is here today because of his Lumbar facet arthropathy [M47.816]. Grant Young primary complain today is Back Pain (Lumbar midline ) Last encounter: My last encounter with him was on 11/19/2022 Pertinent problems: Grant Young has Lumbar facet arthropathy and Chronic pain syndrome on their pertinent problem list. Pain Assessment: Severity of Chronic pain is reported as a 5 /10. Location: Back Lower, Mid/denies. Onset: More than a month ago. Quality: Constant, Aching, Discomfort, Tender, Tiring, Throbbing. Timing: Constant. Modifying factor(s): ice, medications, heat, rest or getting off his feet. Vitals:  height is  (1.753 m) and weight is 142 lb (64.4 kg). His temporal temperature is 97.5 F (36.4 C) (abnormal). His blood pressure is 165/85 (abnormal) and his pulse is 74. His respiration is 16 and oxygen saturation is 100%.   Reason for encounter: Medication management and to discuss Sprint peripheral nerve stimulation.  Majority of this visit was spent answering questions regarding Sprint peripheral nerve stimulation of the lumbar medial branch nerves for persistent and severe axial  low back pain. I provided the patient with resources regarding Sprint peripheral nerve stimulation and also reached out to peripheral nerve stimulation representative to connect with Grant Young to review more specific technical questions with him.  The patient also wanted to view the device.  His neurologist has also recommended that he wean his hydrocodone which he is in the process of doing.  I will provide him with 1 remaining prescription so that he can reduce his intake to 1 to 1.5 tablets daily.  Post-procedure evaluation   Procedure: Lumbar Facet, Medial Branch Block(s) #2    Laterality: Bilateral  Level(s): L3, L4, and L5 Medial Branch Level(s).  Rationale: Injecting these levels should provide temporary sensory nerve conduction blockade of the L3-4 and L4-5 lumbar facet (zygapophyseal) joints. Target: Terminal medial branch nerve division of lumbosacral nerve root dorsal rami.  Procedure No.2: Sacroiliac joint injection #1    Laterality: Bilateral     Approach: Inferior postero-medial percutaneous approach. Level: PIIS (Posterior Inferior Iliac Spine) Sacroiliac Joint Target: For lower sacroiliac joint block(s), the target is the inferior and posterior margin of the sacroiliac joint.  Imaging: Fluoroscopic guidance         Anesthesia: Local anesthesia (1-2% Lidocaine) DOS: 10/28/2022 Performed by: Edward Jolly, MD  Patient stopped Plavix 7 days prior  Purpose: Diagnostic/Therapeutic Indications: Low back pain severe enough to impact quality of life or function. Medical necessity rationale: procedure needed and proper for the diagnosis and/or treatment of the patient's medical symptoms and needs. 1. SI (sacroiliac) joint dysfunction   2. Lumbar facet arthropathy   3. Lumbar spondylosis   4. Chronic pain syndrome   5. Piriformis syndrome of both sides    NAS-11 Pain score:  Pre-procedure: 7 /10   Post-procedure: 0-No pain/10      Effectiveness:   Analgesic:   50% Function: Grant Young reports improvement in function ROM: Grant Young reports improvement in ROM    Pharmacotherapy Assessment  Analgesic:Hydrocodone IR 10 mg daily as needed, weaning  Monitoring: [place] PMP: PDMP reviewed during this encounter.       Pharmacotherapy: No side-effects or adverse reactions reported. Compliance: No problems identified. Effectiveness: Clinically acceptable.  Vernie Ammons, RN  12/29/2022  1:19 PM  Sign when Signing Visit Nursing Pain Medication Assessment:  Safety precautions to be maintained throughout the outpatient stay will include: orient to surroundings, keep bed in low position, maintain call bell within reach at all times, provide assistance with transfer out of bed and ambulation.  Medication Inspection Compliance: Pill count conducted under aseptic conditions, in front of the patient. Neither the pills nor the bottle was removed from the patient's sight at any time. Once count was completed pills were immediately returned to the patient in their original bottle.  Medication: Hydrocodone/APAP Pill/Patch Count:  8.5 of 90 pills remain Pill/Patch Appearance: Markings consistent with prescribed medication Bottle Appearance: Standard pharmacy container. Clearly labeled. Filled Date: 02 / 12 / 2024 Last Medication intake:  Today     UDS:  Summary  Date Value Ref Range Status  09/17/2020 Note  Final    Comment:    ==================================================================== Compliance Drug Analysis, Ur ==================================================================== Specimen Alert Note: Urinary creatinine is low; ability to detect some drugs may be compromised. Interpret results with caution. (Creatinine) ==================================================================== Test                             Result       Flag       Units  Drug Present and Declared for Prescription Verification   Lorazepam                      1107          EXPECTED   ng/mg creat    Source of lorazepam is a scheduled prescription medication.    Butalbital                     PRESENT      EXPECTED   Citalopram                     PRESENT      EXPECTED   Desmethylcitalopram            PRESENT      EXPECTED    Desmethylcitalopram is an expected metabolite of citalopram or the    enantiomeric form, escitalopram.    Acetaminophen                  PRESENT      EXPECTED   Propranolol                    PRESENT      EXPECTED  Drug Present not Declared for Prescription Verification   Oxazepam                       300          UNEXPECTED ng/mg creat   Temazepam                      357  UNEXPECTED ng/mg creat    Oxazepam and temazepam are expected metabolites of diazepam.    Oxazepam is also an expected metabolite of other benzodiazepine    drugs, including chlordiazepoxide, prazepam, clorazepate, halazepam,    and temazepam.  Oxazepam and temazepam are available as scheduled    prescription medications.  Drug Absent but Declared for Prescription Verification   Hydrocodone                    Not Detected UNEXPECTED ng/mg creat   Tizanidine                     Not Detected UNEXPECTED    Tizanidine, as indicated in the declared medication list, is not    always detected even when used as directed.    Amitriptyline                  Not Detected UNEXPECTED   Prochlorperazine               Not Detected UNEXPECTED   Salicylate                     Not Detected UNEXPECTED    Aspirin, as indicated in the declared medication list, is not always    detected even when used as directed.  ==================================================================== Test                      Result    Flag   Units      Ref Range   Creatinine              14        LL     mg/dL      >=16 ==================================================================== Declared Medications:  The flagging and interpretation on this report are based on the   following declared medications.  Unexpected results may arise from  inaccuracies in the declared medications.   **Note: The testing scope of this panel includes these medications:   Amitriptyline (Elavil)  Butalbital (Fioricet)  Butalbital (Fiorinal)  Escitalopram (Lexapro)  Hydrocodone (Norco)  Lorazepam (Ativan)  Prochlorperazine (Compazine)  Propranolol (Inderal)   **Note: The testing scope of this panel does not include small to  moderate amounts of these reported medications:   Acetaminophen (Fioricet)  Acetaminophen (Norco)  Aspirin (Fiorinal)  Tizanidine (Zanaflex)   **Note: The testing scope of this panel does not include the  following reported medications:   Anastrozole (Arimidex)  Caffeine (Fioricet)  Caffeine (Fiorinal)  Chlorthalidone (Hygroton)  Cholecalciferol  Cholestyramine (Questran)  Dicyclomine (Bentyl)  Ezetimibe (Zetia)  Hydrocortisone  Melatonin  Ondansetron (Zofran)  Potassium (Klor-Con)  Rabeprazole (Aciphex)  Sucralfate (Carafate)  Valacyclovir (Valtrex)  Vitamin B12 ==================================================================== For clinical consultation, please call [phone]. ====================================================================      ROS  Constitutional: Denies any fever or chills Gastrointestinal: No reported hemesis, hematochezia, vomiting, or acute GI distress Musculoskeletal:  Low back pain  Neurological: No reported episodes of acute onset apraxia, aphasia, dysarthria, agnosia, amnesia, paralysis, loss of coordination, or loss of consciousness  Medication Review  Cholecalciferol, DHEA, Digestive Aids Mixture, Evolocumab, HYDROcodone-acetaminophen, Hyoscyamine Sulfate SL, Loperamide HCl, Magnesium Bisglycinate, NONFORMULARY OR COMPOUNDED ITEM, Nutritional Supplements, Potassium, Probiotic Product, RABEprazole, Vitamin B-12, anastrozole, butalbital-acetaminophen-caffeine, cetirizine, clopidogrel, diazepam,  dicyclomine, ezetimibe, ferrous sulfate, ipratropium, melatonin, nortriptyline, polyethylene glycol, prochlorperazine, propranolol ER, pyridostigmine, sucralfate, testosterone cypionate, valACYclovir, and venlafaxine XR  History Review  Allergy: Grant Young is allergic to ace inhibitors, fluoxetine, metoclopramide, nalbuphine,  other, amoxicillin-pot clavulanate, doxazosin, duloxetine, penicillins, tamsulosin, trazodone and nefazodone, amlodipine, cinoxacin, ciprofloxacin, fludrocortisone, nebivolol, olanzapine, olmesartan, pregabalin, prostaglandins, thyroid hormones, zolpidem, duloxetine hcl, fluoxetine hcl, nucynta [tapentadol], and phenytoin. Drug: Grant Young  reports that he does not currently use drugs. Alcohol:  reports current alcohol use. Tobacco:  reports that he has never smoked. He has never been exposed to tobacco smoke. He has never used smokeless tobacco. Social: Grant Young  reports that he has never smoked. He has never been exposed to tobacco smoke. He has never used smokeless tobacco. He reports current alcohol use. He reports that he does not currently use drugs. Medical:  has a past medical history of Anxiety, Chronic back pain, Chronic heart disease, Depression, GERD (gastroesophageal reflux disease), Headache, History of gastritis, History of neuropathy, Hypertension, Insomnia, Kidney stone, Lumbar radicular pain, Lyme disease, Myocardial infarction, Overactive bladder, PONV (postoperative nausea and vomiting), Skin cancer, basal cell, Spinal cord stimulator status, Squamous cell skin cancer, and Substance abuse. Surgical: Grant Young  has a past surgical history that includes Kidney stone surgery (09/14/1980); Kidney stone surgery (09/15/1995); Lithotripsy (09/14/2014); Lithotripsy (09/14/1996); Lithotripsy (09/14/1997); Gallbladder surgery (09/15/2015); Sigmoidoscopy (09/14/2017); Colonoscopy (09/15/2015); Cholecystectomy (2017); Shoulder surgery (Right, 1984); Back surgery; Spinal cord  stimulator implant; Pain pump implantation; Pain pump removal; Tonsillectomy; Foot surgery (Bilateral, 03/18/2020); Foot surgery (08/032021); Upper gi endoscopy; Cardiac catheterization; Colonoscopy with propofol (N/A, 10/03/2020); Esophagogastroduodenoscopy (egd) with propofol (N/A, 10/03/2020); Cataract extraction w/PHACO (Left, 01/14/2021); Cataract extraction w/PHACO (Right, 01/28/2021); Coronary/Graft Acute MI Revascularization (N/A, 11/27/2021); LEFT HEART CATH AND CORONARY ANGIOGRAPHY (N/A, 11/27/2021); and Excision basal cell carcinoma. Family: family history includes Anxiety disorder in his daughter; Dementia in his mother; Heart disease in his father; Heart failure in his father; Hypertension in his father; Kidney Stones in his daughter; OCD in his daughter; Stroke in his father and mother.  Laboratory Chemistry Profile   Renal Lab Results  Component Value Date   BUN 11 09/18/2022   CREATININE 0.76 09/18/2022   BCR SEE NOTE: 09/18/2022   GFRAA 101 05/20/2020   GFRNONAA >60 08/20/2022    Hepatic Lab Results  Component Value Date   AST 18 09/24/2022   ALT 14 09/24/2022   ALBUMIN 3.8 09/24/2022   ALKPHOS 107 09/24/2022   AMYLASE 32 09/18/2022   LIPASE 14 09/18/2022    Electrolytes Lab Results  Component Value Date   NA 143 09/18/2022   K 4.9 09/18/2022   CL 104 09/18/2022   CALCIUM 9.5 09/18/2022   MG 2.0 08/20/2022   PHOS 3.6 11/30/2021    Bone Lab Results  Component Value Date   TESTOSTERONE 33.2 05/20/2020    Inflammation (CRP: Acute Phase) (ESR: Chronic Phase) Lab Results  Component Value Date   CRP 22.9 (H) 08/18/2022   LATICACIDVEN 1.4 08/18/2022         Note: Above Lab results reviewed.   Physical Exam  General appearance: Well nourished, well developed, and well hydrated. In no apparent acute distress Mental status: Alert, oriented x 3 (person, place, & time)       Respiratory: No evidence of acute respiratory distress Eyes: PERLA Vitals: BP (!)  165/85   Pulse 74   Temp (!) 97.5 F (36.4 C) (Temporal)   Resp 16   Ht 5\' 9"  (1.753 m)   Wt 142 lb (64.4 kg)   SpO2 100%   BMI 20.97 kg/m  BMI: Estimated body mass index is 20.97 kg/m as calculated from the following:   Height as of this encounter: 5\' 9"  (  1.753 m).   Weight as of this encounter: 142 lb (64.4 kg). Ideal: Ideal body weight: 70.7 kg (155 lb 13.8 oz)  Lumbar Spine Area Exam  Skin & Axial Inspection: Well healed scar from previous spine surgery detected IPG present from SCS Alignment: Scoliosis detected Functional ROM: Pain restricted ROM       Stability: No instability detected Muscle Tone/Strength: Functionally intact. No obvious neuro-muscular anomalies detected. Sensory (Neurological): Facetogenic pain pattern, pain with facet loading  Hyperextension/rotation test: (+) bilaterally for facet joint pain. Lumbar quadrant test (Kemp's test): (+) bilaterally for facet joint pain.   Gait & Posture Assessment  Ambulation: Unassisted Gait: Relatively normal for age and body habitus Posture: WNL    Lower Extremity Exam      Side: Right lower extremity   Side: Left lower extremity  Stability: No instability observed           Stability: No instability observed          Skin & Extremity Inspection: Skin color, temperature, and hair growth are WNL. No peripheral edema or cyanosis. No masses, redness, swelling, asymmetry, or associated skin lesions. No contractures.   Skin & Extremity Inspection: Skin color, temperature, and hair growth are WNL. No peripheral edema or cyanosis. No masses, redness, swelling, asymmetry, or associated skin lesions. No contractures.  Functional ROM: Unrestricted ROM                   Functional ROM: Unrestricted ROM                  Muscle Tone/Strength: Functionally intact. No obvious neuro-muscular anomalies detected.   Muscle Tone/Strength: Functionally intact. No obvious neuro-muscular anomalies detected.  Sensory (Neurological):  Unimpaired         Sensory (Neurological): Unimpaired        DTR: Patellar: deferred today Achilles: deferred today Plantar: deferred today   DTR: Patellar: deferred today Achilles: deferred today Plantar: deferred today  Palpation: No palpable anomalies   Palpation: No palpable anomalies     Assessment    Diagnosis   1. Lumbar facet arthropathy   2. Lumbar spondylosis   3. Piriformis syndrome of both sides   4. Chronic pain syndrome   5. Spinal cord stimulator status Conservation officer, historic buildings)   6. SI (sacroiliac) joint dysfunction         Plan of Care   1. Lumbar facet arthropathy - DG Lumbar Spine Complete W/Bend; Future  2. Lumbar spondylosis - DG Lumbar Spine Complete W/Bend; Future  3. Piriformis syndrome of both sides  4. Chronic pain syndrome - DG Lumbar Spine Complete W/Bend; Future  5. Spinal cord stimulator status Conservation officer, historic buildings)  6. SI (sacroiliac) joint dysfunction  Patient had a positive response after his 2 sets of diagnostic lumbar facet medial branch nerve blocks.  We have discussed lumbar radiofrequency ablation versus [employer] peripheral nerve stimulation for his low back pain.  He wants to obtain additional information and meet with [employer] peripheral nerve stimulator representative  Wean hydrocodone as discussed  Requested Prescriptions   Signed Prescriptions Disp Refills   HYDROcodone-acetaminophen (NORCO/VICODIN) 5-325 MG tablet 45 tablet 0    Sig: Take 1 tablet by mouth every 12 (twelve) hours as needed for severe pain. Must last 30 days.      Follow-up plan:   Return for patient will call to schedule F2F appt prn.    Recent Visits Date Type Provider Dept  11/19/22 Office Visit Edward Jolly, MD [facility]  10/28/22 Procedure visit Edward Jolly, MD [facility]  Showing recent visits within past 90 days and meeting all other requirements Today's Visits Date Type Provider Dept  12/29/22 Office Visit Edward Jolly,  MD [facility]  Showing today's visits and meeting all other requirements Future Appointments No visits were found meeting these conditions. Showing future appointments within next 90 days and meeting all other requirements  I discussed the assessment and treatment plan with the patient. The patient was provided an opportunity to ask questions and all were answered. The patient agreed with the plan and demonstrated an understanding of the instructions.  Patient advised to call back or seek an in-person evaluation if the symptoms or condition worsens.  Duration of encounter: .  Note by: Edward Jolly, MD Date: 12/29/2022; Time: 3:52 PM

## 2022-12-29 NOTE — Progress Notes (Signed)
Nursing Pain Medication Assessment:  Safety precautions to be maintained throughout the outpatient stay will include: orient to surroundings, keep bed in low position, maintain call bell within reach at all times, provide assistance with transfer out of bed and ambulation.  Medication Inspection Compliance: Pill count conducted under aseptic conditions, in front of the patient. Neither the pills nor the bottle was removed from the patient's sight at any time. Once count was completed pills were immediately returned to the patient in their original bottle.  Medication: Hydrocodone/APAP Pill/Patch Count:  8.5 of 90 pills remain Pill/Patch Appearance: Markings consistent with prescribed medication Bottle Appearance: Standard pharmacy container. Clearly labeled. Filled Date: 02 / 12 / 2024 Last Medication intake:  Today

## 2022-12-30 ENCOUNTER — Other Ambulatory Visit: Payer: Self-pay | Admitting: Internal Medicine

## 2022-12-31 ENCOUNTER — Ambulatory Visit: Payer: Medicare Other | Attending: Cardiology | Admitting: Cardiology

## 2022-12-31 ENCOUNTER — Encounter: Payer: Self-pay | Admitting: Cardiology

## 2022-12-31 ENCOUNTER — Other Ambulatory Visit: Payer: Self-pay | Admitting: Pulmonary Disease

## 2022-12-31 VITALS — BP 118/70 | HR 65 | Ht 69.0 in | Wt 142.0 lb

## 2022-12-31 DIAGNOSIS — E785 Hyperlipidemia, unspecified: Secondary | ICD-10-CM | POA: Diagnosis present

## 2022-12-31 DIAGNOSIS — I2510 Atherosclerotic heart disease of native coronary artery without angina pectoris: Secondary | ICD-10-CM | POA: Diagnosis present

## 2022-12-31 DIAGNOSIS — T466X5A Adverse effect of antihyperlipidemic and antiarteriosclerotic drugs, initial encounter: Secondary | ICD-10-CM | POA: Diagnosis present

## 2022-12-31 DIAGNOSIS — G720 Drug-induced myopathy: Secondary | ICD-10-CM | POA: Insufficient documentation

## 2022-12-31 DIAGNOSIS — Z01818 Encounter for other preprocedural examination: Secondary | ICD-10-CM | POA: Diagnosis present

## 2022-12-31 DIAGNOSIS — R0989 Other specified symptoms and signs involving the circulatory and respiratory systems: Secondary | ICD-10-CM | POA: Insufficient documentation

## 2022-12-31 DIAGNOSIS — Z9861 Coronary angioplasty status: Secondary | ICD-10-CM | POA: Diagnosis present

## 2022-12-31 DIAGNOSIS — R911 Solitary pulmonary nodule: Secondary | ICD-10-CM

## 2022-12-31 MED ORDER — PROPRANOLOL HCL 40 MG PO TABS: 40.0000 mg | ORAL_TABLET | Freq: Two times a day (BID) | ORAL | 3 refills | Status: DC

## 2022-12-31 NOTE — Progress Notes (Signed)
Primary Care Provider: Lorre Munroe, NP [facility] Cardiologist: Bryan Lemma, MD Electrophysiologist: None  Clinic Note: Chief Complaint  Patient presents with   Follow-up    70-month follow-up   Coronary Artery Disease    Not having angina   Hypertension    Labile blood pressures, but also having low pressures with dizziness   Hyperlipidemia    Did not tolerate Repatha-stopped taking-headaches and muscle aches and nausea   ===================================  ASSESSMENT/PLAN   Problem List Items Addressed This Visit       Cardiology Problems   Labile hypertension (Chronic)    He has a lot of issues with orthostasis and dizziness. I stressed importance of dietary modification for blood pressure and lipids, but he does need to be careful to ensure adequate hydration. He is currently on propranolol 80 mg twice daily but has a lot of fatigue and exercise intolerance.  He did not tolerate hydralazine stating worsening dizziness.  Plan: DC hydralazine as a standing medication. => He will use it only PRN for sustained SBP greater than 160 mmHg.  (This means he will only take hydralazine if he has a headache or some reason to check his blood pressure and is over 160 mmHg systoliC on the initial and then secondary to check after 1 hour. Continue propranolol at 80 mg twice daily for now while he is weaning off Norco to avoid stress related palpitations etc.  However 2 weeks after fully weaning off of Norco, I would like him to reduce morning dose of propranolol to 40 mg dosing.  Continue 80 mg in the evening.      Relevant Medications   propranolol (INDERAL) 40 MG tablet   Other Relevant Orders   EKG 12-Lead (Completed)   Lipid panel   Comp Met (CMET)   Lipoprotein A (LPA)   Hyperlipidemia LDL goal <70 (Chronic)    His lipids in January look great!!  Unfortunately, he is now no longer taking Repatha.  I think we have a little bit of the time to ensure that is  fully out of the system, him and he is fully weaned off of Norco to avoid any other confusing symptoms.  I would then recheck lipid panel and LP(a). => Pending results anticipate referral to [facility] to discuss potentially converting to Praluent versus even inclisiran.      Relevant Medications   propranolol (INDERAL) 40 MG tablet   Other Relevant Orders   EKG 12-Lead (Completed)   Lipid panel   Comp Met (CMET)   Lipoprotein A (LPA)   Coronary artery disease involving native coronary artery of native heart without angina pectoris - Primary (Chronic)    No further angina or CHF symptoms.  Unfortunately risk factor modification has become difficult with his intolerance of medications.  Now he is no longer taking Repatha, but we do have a little bit of leeway because his labs in January were outstanding. His blood pressures are on the high side normal to abnormally high but he is intolerant lower blood pressures.  Becomes very dizzy.  Plan: He is on propranolol which is the only beta-blocker he can tolerate, however he is having a lot of dizziness and exercise intolerance Reduce morning dose of propranolol to 40 mg and continue p.m. dose of 80 mg. (However I will wait until he is fully weaned off of his Norco to do this maneuver.  He will wait 2 weeks after fully weaning off Norco and then decrease  his daytime dose) He stopped taking hydralazine and I will have him hold off on using hydralazine only for PRN purposes. Continue Zetia =>  Check lipid panel along with LP(a) along with chemistries at the end of May => anticipate that based on these results we will refer him to our lipid clinic to discuss options of replacing Repatha with potentially Praluent or even inclisiran.      Relevant Medications   propranolol (INDERAL) 40 MG tablet   Other Relevant Orders   EKG 12-Lead (Completed)   Lipid panel   Comp Met (CMET)   Lipoprotein A (LPA)   CAD S/P PCI (Chronic)    He is now 1 year  out from his two-vessel PCI.  Beyond the point where we need to have him continue on uninterrupted DAPT.  At this point I would like to reduce him to Plavix monotherapy stopping his aspirin.  For sake of ease and instructions, he will take his aspirin 81 mg until April 29 and then stop.  He will continue taking Plavix but will pause Plavix for his upcoming procedure-stopping on April 27 in preparation for his procedure), he will then restart postprocedure day 1 => the first day of restarting he will take 2 tablets a day and then go back to taking 1 tablet daily.  Summary HOLD Plavix (Clopidogrel) on April 27th until the day after. THEN take 2 tablets once daily for 2 days THEN go back to once daily.  Going forward he will continue on Plavix monotherapy but can be held 5 to 7 days preop for surgeries or procedures Take Aspirin until April 29th and then STOP -> will stay off of aspirin going forward      Relevant Medications   propranolol (INDERAL) 40 MG tablet   Other Relevant Orders   EKG 12-Lead (Completed)   Lipid panel   Comp Met (CMET)   Lipoprotein A (LPA)     Other   Statin myopathy (Chronic)    Has been intolerant of multiple different statins including atorvastatin, rosuvastatin, pravastatin and rosuvastatin. He had myalgias but also memory issues and headaches.  Unfortunately, he was also not able to tolerate Repatha citing headaches and nausea.  This was on a rechallenge where symptoms occurred after starting back and then stopped again once discontinuing.  After rechecking lipid panel and LP(a) in the end of May, anticipate referral to [facility] ([facility] [place])      Relevant Orders   EKG 12-Lead (Completed)   Lipid panel   Comp Met (CMET)   Lipoprotein A (LPA)   Preoperative clearance    Upcoming procedure with pulmonary medicine weight will probably potentially have biopsies.  The plan is to hold Plavix as of April  27, and stop aspirin as of April 29. He will not restart aspirin following procedure. On postprocedure day 1 we will restart Plavix taking 2 tablets a day for 2 days and then continue 1 tablet daily going forward.      ===================================  HPI:    Grant Young is a 77 y.o. male with a PMH  with history of CAD with MI x 2 in 1990 & 1999 s/p PTCA and with NSTEMI in 11/2021 status post PCI/DES to the LAD and LCx, HFpEF, HTN, HLD, headaches, GERD, multiple medication intolerances who presents today for 2 month at the request of [name], Regina W, NP.  CAD-MI x 3: CAD with MI x 2 in 1990 & 1999 s/p  PTCA and with  NSTEMI 11/2021: status post PCI/DES to the LAD and LCx EF Echo 50 to 55%. HTN, HLD-has been on Repatha for lipids-temporarily held because of abdominal pain but recommended to restart.  Grant Young was last seen on by Alycia Rossetti done on November 02, 2022.  He denied any active angina or CHF symptoms.  Noted back pain headaches and GI distress that he attributed to Repatha.  Plan was to resume Repatha later in the week.  Also with increased back pain blood pressure is increased with systolic pressures mostly being in the 130s to 160s and blood pressures in the 60s to 50s.  Neurology recommended increasing propranolol dose to 80 mg twice daily for tremor (the only beta-blocker that he has tolerated).  Tolerating well.  Decided to hold off restarting chlorthalidone, as he seems to be euvolemic.  Decided to avoid rechallenging with SGLT2 inhibitor as well. For hypertension, decided to try hydralazine 25 mg twice daily Recommended restarting Repatha  Recent Hospitalizations: n/a  Reviewed  CV studies:    The following studies were reviewed today: (if available, images/films reviewed: From Epic Chart or Care Everywhere) No new studies:   Interval History:   Grant Young presents here today with his wife who really helps provide a lot of information because he is a relatively  poor historian.  Apparently he has not been tolerating medications well.  He restarted the Repatha and again started having headaches and dizziness and nausea with sleep disturbance.  He therefore decided to stop taking that.  He also decided to stop hydralazine because he has been having a lot of dizziness.  He actually stopped doing his physical therapy because of dizziness.  He was noticing that he did feel dizzy and lightheaded his heart rate go up.  Very hard to get a full story for him.  He brings him blood pressure log that shows heart rate range mostly in the 50s this 60s, blood pressure ranged from 124/64 to 158/81 with most of SBPs being in the 130s and 140 range.  He started getting very very dizzy in March and finally stopped taking hydralazine on the first couple days of March.  After that, despite blood pressure remains relatively the same, he said he was less dizzy.  At the same time, he is also been having episodes where his blood pressures have been quite high-not recorded here but he says sometimes it is in the 180s.  He seems to doing relatively well as far as true cardiac symptoms of heart failure.  He has not been as active of late because of his dizziness however.  Does not seem to be noticing any chest pain or pressure with rest or exertion.  No real PND orthopnea but does have some end of day swelling.  Besides feeling his heart rate go up with exercise and anxiety, he has not had any rapid irregular heartbeats or palpitations.  No syncope or near syncope just the dizziness and lightheadedness.  He has had a couple falls from losing his balance and feeling dizzy.  Some of this seems to be vertiginous in nature. What he notes is a lot of exercise intolerance/easy fatigue.  Hard to get up and going as though he cannot get himself "revved up".  He notes that his heart rate does not get up very fast with exercise.  A lot of his problems seem to be stem from the fact that he has been  weaning off of Norco for  his chronic pain syndrome and a lot of his symptoms may be confused with increased pain without Norco.  Unfortunately, he now seems to be relatively intolerant of despite any cluster medicine besides Zetia.  He had questions about Plavix and aspirin.  He apparently has an upcoming pulmonary procedure where there is likely to do biopsies and probably needs to hold Plavix.  REVIEWED OF SYSTEMS   Review of Systems  Constitutional:  Positive for malaise/fatigue.  HENT:  Negative for congestion and nosebleeds.   Respiratory:  Negative for cough and shortness of breath.   Gastrointestinal:  Negative for abdominal pain, blood in stool, constipation and melena.  Genitourinary:  Negative for frequency (Nocturia once or twice a night) and hematuria.  Musculoskeletal:  Positive for back pain, falls, joint pain and myalgias.  Neurological:  Positive for dizziness, weakness (Generalized weakness -legs) and headaches. Negative for focal weakness.       Headaches and dizziness are much better since stopping Repatha and hydralazine  Psychiatric/Behavioral:  Positive for memory loss. Negative for depression (Seems to have some dysthymia). The patient is nervous/anxious. The patient does not have insomnia.    I have reviewed and (if needed) personally updated the patient's problem list, medications, allergies, past medical and surgical history, social and family history.   PAST MEDICAL HISTORY   Past Medical History:  Diagnosis Date   Anxiety    Chronic back pain    Depression    GERD (gastroesophageal reflux disease)    h/o Lyme disease    Headache    History of gastritis    History of neuropathy    Hyperlipidemia LDL goal <70    CAD with NSTEMI _> intolerant of statins with statin myopathy and memory issues.;  Also intolerant of Repatha   Hypertension    Labile blood pressures but dizziness with blood pressures in the "normal range"; intolerant of most medications  including ARB's, ACE inhibitor's, amlodipine most beta-blockers other than propranolol.   Insomnia    Kidney stone    Lumbar radicular pain    Myocardial infarction 11/2021   a) MI x 2 - 1990 * 1999 - PTCA; b) NSTEMI 11/2021 -> 100% LCx & severe LAD disease -> 2 V PCI. EF ~50-55%   Overactive bladder    PONV (postoperative nausea and vomiting)    Skin cancer, basal cell    Spinal cord stimulator status    01/08/21 - not currently using.   Squamous cell skin cancer    Substance abuse     PAST SURGICAL HISTORY   Past Surgical History:  Procedure Laterality Date   BACK SURGERY     BASAL CELL CARCINOMA EXCISION     CATARACT EXTRACTION W/PHACO Left 01/14/2021   Procedure: CATARACT EXTRACTION PHACO AND INTRAOCULAR LENS PLACEMENT (IOC) LEFT VIVITY TORIC LENS 8.75 00:56.7;  Surgeon: Galen Manila, MD;  Location: [facility];  Service: Ophthalmology;  Laterality: Left;   CATARACT EXTRACTION W/PHACO Right 01/28/2021   Procedure: CATARACT EXTRACTION PHACO AND INTRAOCULAR LENS PLACEMENT (IOC) RIGHT VIVITY TORIC LENS;  Surgeon: Galen Manila, MD;  Location: [facility];  Service: Ophthalmology;  Laterality: Right;  6.54 00:46.4   CHOLECYSTECTOMY  2017   COLONOSCOPY  09/15/2015   Per New Patient Packet   COLONOSCOPY WITH PROPOFOL N/A 10/03/2020   Procedure: COLONOSCOPY WITH PROPOFOL;  Surgeon: Wyline Mood, MD;  Location: [facility];  Service: Gastroenterology;  Laterality: N/A;   CORONARY/GRAFT ACUTE MI REVASCULARIZATION N/A 11/27/2021   Procedure: Coronary/Graft Acute MI Revascularization;  Surgeon: Marykay Lex, MD;  Location: [facility] CATH: (NSTEMI) - > 100% prox-mid LCx (Onyx Frontier DES 2.5 x 26 -> 2.7 mm, Ost  OM1 65%.  Ost-mid LAD 3 lesions 75%, 90%, 99% => DES PCI Onyx Frontier DES 2.5 x 34 -> 2.8 mm   ESOPHAGOGASTRODUODENOSCOPY (EGD) WITH PROPOFOL N/A 10/03/2020   Procedure: ESOPHAGOGASTRODUODENOSCOPY (EGD) WITH PROPOFOL;  Surgeon: Wyline Mood, MD;  Location:  [facility] END[facility]COPY;  Service: Gastroenterology;  Laterality: N/A;   FOOT SURGERY Bilateral 03/18/2020   FOOT SURGERY  08/032021   GALLBLADDER SURGERY  09/15/2015   Gallbladder Removal. Procedure done by Dr.Beverly. Per New Patient Packet   KIDNEY STONE SURGERY  09/14/1980   Too many to count. Per New Patient Packet 09/14/1980-09/15/1995   KIDNEY STONE SURGERY  09/15/1995   Too many to count. Per New Patient Packet   LEFT HEART CATH AND CORONARY ANGIOGRAPHY N/A 11/27/2021   Procedure: LEFT HEART CATH AND CORONARY ANGIOGRAPHY;  Surgeon: Marykay Lex, MD;  Location[facility] CATH:  NSTEMI - 2 V CAD: 100% thrombotic prox-mid LCx (DES PCI), ost OM1 65%; Ost LAD 75% - prox LAD 90%, prox-mid 99% (DES PCI).  MIld diffuse RCA disease - calcicifed.  R-L collaterals filling LCx. EF ~45-50% with lateral HK. LVEDP 28 mmHg   LITHOTRIPSY  09/14/2014   Per New Patient Packet   LITHOTRIPSY  09/14/1996   No Stints Used. Per New Patient Packet   LITHOTRIPSY  09/14/1997   No Stints used. Per New Patient Packet   PAIN PUMP IMPLANTATION     PAIN PUMP REMOVAL     SHOULDER SURGERY Right 1984   SIGMOIDOSCOPY  09/14/2017   Per New Patient Packet   SPINAL CORD STIMULATOR IMPLANT     TONSILLECTOMY     TRANSTHORACIC ECHOCARDIOGRAM  11/28/2021   (in setting of non-STEMI): EF 50 to 55%.  Suspect inferior and lateral hypokinesis.  Indeterminate diastolic parameters.  Normal RV size and function.  Normal RAP.  Normal aortic and mitral valves with only mild MR and AI.  Normal RVP and RAP.   UPPER GI ENDOSCOPY     Cardiac Cath PCI 11/28/2021: Acute Coronary Syndrome / NSTEMI (with STEMI physiology) Severe two-vessel CAD : Culprit lesion 100% occluded proximal and mid LCx  Sequential ostial proximal LAD 75, 80 to 99% stenosis Successful revascularization of occluded LCx with Onyx Frontier 2.5 mm x 26 mm deployed to 2.7 mm.  TIMI 0 flow improved to TIMI-3.  Also TIMI-3 flow noted in major/small caliber OM branch with an ostial  60 to 70% stenosis (not PCI during Successful ostial to mid LAD PCI (3 tandem lesions ~total length 32 mmm) covering the total lesion segment with an Onyx Frontier 2.5 mm x 34 mm-deployed/postdilated to 2.8 mm Heavily calcified but widely patent large dominant RCA that did provide collaterals to the occluded LCx Mildly reduced EF with lateral hypokinesis and significant elevated LVEDP of 28 mmHg   MEDICATIONS/ALLERGIES   Current Meds  Medication Sig   anastrozole (ARIMIDEX) 1 MG tablet 1/2 tablet (0.5 mg) by mouth once weekly   butalbital-acetaminophen-caffeine (FIORICET) 50-325-40 MG tablet TAKE 1 TABLET BY MOUTH EVERY 6 HOURS AS NEEDED FOR HEADACHE.   cetirizine (ZYRTEC) 10 MG tablet Take 10 mg by mouth daily as needed for allergies.   Cholecalciferol 50 MCG (2000 UT) CAPS Take 1 tablet by mouth daily.   clopidogrel (PLAVIX) 75 MG tablet TAKE 1 TABLET BY MOUTH ONCE DAILY.   Cyanocobalamin (VITAMIN B-12) 5000 MCG LOZG Take 1  lozenge by mouth daily.    DHEA 25 MG CAPS Take 25 mg by mouth daily.   diazepam (VALIUM) 5 MG tablet TAKE 1 TABLET IN THE MORNING AND 2 TABLETS AT BEDTIME   dicyclomine (BENTYL) 10 MG capsule Take 1 capsule (10 mg total) by mouth 4 (four) times daily -  before meals and at bedtime.   Digestive Aids Mixture (DIGESTION GB PO) Take 1 capsule by mouth daily.   Evolocumab (REPATHA SURECLICK) 140 MG/ML SOAJ Inject 140 mg into the skin every 14 (fourteen) days.-  NOT TAKING   ferrous sulfate 325 (65 FE) MG tablet Take 325 mg by mouth. Once daily on Mondays, Wednesdays and Fridays   HYDROcodone-acetaminophen (NORCO/VICODIN) 5-325 MG tablet Take 1 tablet by mouth every 12 (twelve) hours as needed for severe pain. Must last 30 days.   ipratropium (ATROVENT) 0.06 % nasal spray Place 1 spray into both nostrils 4 (four) times daily as needed for rhinitis. For up to 5-7 days then stop.   Loperamide HCl (IMODIUM PO) Take by mouth as needed.    MAGNESIUM BISGLYCINATE PO Take by mouth  daily.   melatonin 5 MG TABS Take 5 mg by mouth at bedtime.   NONFORMULARY OR COMPOUNDED ITEM Bi-Mix Papaverine 30mg , Phentolamine 1mg   Dosage: Inject 1cc per injection ; Vial 1ml '; Qty #5 Refills 6   nortriptyline (PAMELOR) 10 MG capsule Take 10 mg in the morning and 30 mg at night and continue that dose   Nutritional Supplements (NUTRITIONAL SUPPLEMENT PO) Take 1 capsule by mouth daily. [name]   polyethylene glycol (MIRALAX / GLYCOLAX) 17 g packet Take 17 g by mouth daily as needed.   POTASSIUM PO Take 1 tablet by mouth once a week. Takes 5x weekly.   Probiotic Product (PROBIOTIC DAILY PO) Take by mouth daily.   prochlorperazine (COMPAZINE) 10 MG tablet Take 1 tablet (10 mg total) by mouth 2 (two) times daily as needed for nausea or vomiting.   propranolol (INDERAL) 40 MG tablet Take 1 tablet (40 mg total) by mouth 2 (two) times daily.   pyridostigmine (MESTINON) 60 MG tablet Take 0.5 tablets (30 mg total) by mouth 2 (two) times daily.   RABEprazole (ACIPHEX) 20 MG tablet TAKE 1 TABLET BY MOUTH TWICE A DAY   sucralfate (CARAFATE) 1 g tablet Take 1 g by mouth 4 (four) times daily as needed.    testosterone cypionate (DEPOTESTOSTERONE CYPIONATE) 200 MG/ML injection Inject 80 mg into the muscle every 14 (fourteen) days.   valACYclovir (VALTREX) 1000 MG tablet Take 2 tabs p.o. and repeat in 12 hours as needed for cold sore   venlafaxine XR (EFFEXOR XR) 75 MG 24 hr capsule Take 1 capsule (75 mg total) by mouth daily with breakfast.   []  ezetimibe (ZETIA) 10 MG tablet Take 1 tablet (10 mg total) by mouth daily.   []  propranolol ER (INDERAL LA) 80 MG 24 hr capsule Take 1 capsule (80 mg total) by mouth 2 (two) times daily.    Allergies  Allergen Reactions   Ace Inhibitors     Other reaction(s): Cough   Fluoxetine Anxiety    "made me fall asleep" per pt "bad headaches and "makes  Me  Crazy" historical allergy noted in [employer] "made me fall asleep" per pt "bad headaches and  "makes  Me  Crazy" Per New Patient Packet.     Metoclopramide     Other reaction(s): Other (See Comments), Other (See Comments), Unknown Tardive Dyskinesia  historical allergy  noted in [employer] Tardive Dyskinesia  Per New Patient Packet.   Nalbuphine     Used Post Back surgery- Anesthesiologist Error. Patient had Narcotic Withdraw. Per New Patient Packet.    Other     Other reaction(s): Other (See Comments) Altered mental status in combo with narcotics at previous hospitalization - Full Withdrawal Symptoms Other reaction(s): Rash   Amoxicillin-Pot Clavulanate Nausea Only    Per New Patient Packet.   Doxazosin Rash    Other reaction(s): Other - See Comments, Rash UNKNOWN REACTION UNKNOWN REACTION    Duloxetine Nausea Only    Per New Patient Packet.    Penicillins Nausea Only    Per New Patient Packet.   Tamsulosin Itching and Anxiety    Restless, Flushing, Heavy Chest, Itching, Hyperactive mood and Anxiety. Unable to handle side effects. Per New Patient Packet.     Trazodone And Nefazodone Itching, Anxiety and Rash    Headache. "INCREASED MY ANXIETY AND HEARTRATE" Flushing, tachycardia "INCREASED MY ANXIETY AND HEARTRATE" Per New Patient Packet.    Amlodipine     Shaking, unsure of reaction. Per New Patient Packet.     Cinoxacin     GI Intolerance, and Dizziness. Per New Patient Packet.    Ciprofloxacin     Other reaction(s): Unknown   Fludrocortisone Other (See Comments)    "Worsening headaches, GI issues, fatigue"   Nebivolol     Other reaction(s): Unknown   Olanzapine     Headache and unable to sleep for 3 nights. Per New Patient Packet.    Olmesartan     Other reaction(s): Unknown   Pregabalin     Confusion, Lack of concentration, dizziness, and likely drowsiness. Per New Patient Packet.     Prostaglandins     Other reaction(s): Other (See Comments) Intolerance   Thyroid Hormones     Other reaction(s): Other (See Comments) Thyroid (Nature Thyroid)  contraindicated with some of your other medications.   Zolpidem     Nightmares, Ineffective after 2 days. Per New Patient Packet.    Duloxetine Hcl     Other reaction(s): Rash   Fluoxetine Hcl     Other reaction(s): Rash   Nucynta [Tapentadol] Other (See Comments)    Vertigo    Phenytoin Anxiety    Hyperactivity, and Ineffective. Per New Patient Packet.     SOCIAL HISTORY/FAMILY HISTORY   Reviewed in Epic:  Pertinent findings:  Social History   Tobacco Use   Smoking status: Never    Passive exposure: Never   Smokeless tobacco: Never  Vaping Use   Vaping Use: Never used  Substance Use Topics   Alcohol use: Yes    Comment: 1 Drink a Month, socially   Drug use: Not Currently   Social History   Social History Narrative   Tobacco use, amount per day now: None   Past tobacco use, amount per day: None   How many years did you use tobacco: 0   Alcohol use (drinks per week): 0-1 Month   Diet:   Do you drink/eat things with caffeine: Occasionally ( Hot Chocolate and maybe 1/4 of 16oz Pepsi 2-3 times a week.   Marital status: Married                                  What year were you married? 1970   Do you live in a house, apartment, assisted living, condo, trailer, etc.? House  Is it one or more stories? One   How many persons live in your home? 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 Yes                                    Type and how often? [place], Recumbent Bike, 3 times a week. Try to get a 1.2 mile walk at least 3 times a week or more.     Do you have a living will? Yes   Do you have a DNR form?  No                                 If not, do you want to discuss one?   Do you have signed POA/HPOA forms? Yes                       If so, please bring to you appointment    OBJCTIVE -PE, EKG, labs   Wt  Readings from Last 3 Encounters:  12/31/22 142 lb (64.4 kg)  12/29/22 142 lb (64.4 kg)  12/03/22 141 lb (64 kg)    Physical Exam: Vitals:   12/31/22 0944  BP: 118/70  Pulse: 65  Height:  (1.753 m)  Weight: 142 lb (64.4 kg)  SpO2: 95%  BMI (Calculated): 20.96    Physical Exam Vitals reviewed.  Constitutional:      General: He is not in acute distress.    Appearance: He is normal weight. He is not ill-appearing or toxic-appearing.     Comments: Seems somewhat frail and unsteady.  Unsure of speech  HENT:     Head: Normocephalic and atraumatic.  Neck:     Vascular: No carotid bruit or JVD.  Cardiovascular:     Rate and Rhythm: Normal rate and regular rhythm. No extrasystoles are present.    Chest Wall: PMI is not displaced.     Heart sounds: S1 normal and S2 normal. No murmur heard.    No friction rub. No gallop.  Pulmonary:     Effort: Pulmonary effort is normal. No respiratory distress.     Breath sounds: Normal breath sounds. No wheezing, rhonchi or rales.  Musculoskeletal:     Cervical back: Normal range of motion and neck supple.  Neurological:     General: No focal deficit present.     Mental Status: He is alert and oriented to person, place, and time.     Motor: Weakness present.     Gait: Gait abnormal.  Psychiatric:        Mood and Affect: Mood normal.     Comments: Seems to be very perseverative on unusual symptoms and intolerance to just about anything he takes.     Adult ECG Report  Rate: 65;  Rhythm: normal sinus rhythm and LVH with repolarization abnormalities-anterolateral ST depression/T wave versions. ;  Otherwise normal axis, intervals and durations.  Narrative Interpretation: Stable  Recent Labs: Reviewed. Lab Results  Component Value Date   CHOL 105 09/24/2022   HDL 49 09/24/2022   LDLCALC 34 09/24/2022   LDLDIRECT 98.5 01/30/2022   TRIG 110 09/24/2022  CHOLHDL 2.1 09/24/2022   Lab Results  Component Value Date   CREATININE 0.76  09/18/2022   BUN 11 09/18/2022   NA 143 09/18/2022   K 4.9 09/18/2022   CL 104 09/18/2022   CO2 32 09/18/2022      Latest Ref Rng & Units 09/18/2022   10:25 AM 08/20/2022    6:22 AM 08/19/2022    5:49 AM  CBC  WBC 3.8 - 10.8 Thousand/uL 6.0  8.7  8.3   Hemoglobin 13.2 - 17.1 g/dL 16.1  09.6  04.5   Hematocrit 38.5 - 50.0 % 48.8  42.5  42.7   Platelets 140 - 400 Thousand/uL 264  397  362     Lab Results  Component Value Date   HGBA1C 5.1 10/17/2019   Lab Results  Component Value Date   TSH 2.40 10/17/2019    ================================================== I spent a total of 46 min with the patient spent in direct patient consultation.  Additional time spent with chart review  / charting (studies, outside notes, etc): 26 min Total Time: 72 min  Current medicines are reviewed at length with the patient today.  (+/- concerns) n/a  Notice: This dictation was prepared with Dragon dictation along with smart phrase technology. Any transcriptional errors that result from this process are unintentional and may not be corrected upon review.  Studies Ordered:  Orders Placed This Encounter  Procedures   Lipid panel   Comp Met (CMET)   Lipoprotein A (LPA)   EKG 12-Lead   Meds ordered this encounter  Medications   propranolol (INDERAL) 40 MG tablet    Sig: Take 1 tablet (40 mg total) by mouth 2 (two) times daily.    Dispense:  180 tablet    Refill:  3    Patient Instructions / Medication Changes & Studies & Tests Ordered   Patient Instructions  Medication Instructions:  Your physician has recommended you make the following change in your medication:   HOLD Plavix (Clopidogrel) on the 27th until the day after. THEN take 2 tablets once daily for 2 days THEN go back to once daily.  Take Aspirin until the 29th and then STOP.  2 weeks after weaning off of norco we will decrease propranolol to 1/2 tablet and change hydralazine to as needed for top blood pressure number greater  than 160  *If you need a refill on your cardiac medications before your next appointment, please call your pharmacy*   Lab Work: Lipid, CMP, LPa to be done at the end of May. Go to the [facility] entrance and stop at registration desk. These are fasting labs so nothing to eat or drink after midnight the night before except sip of water with your medications.   If you have labs (blood work) drawn today and your tests are completely normal, you will receive your results only by: MyChart Message (if you have MyChart) OR A paper copy in the mail If you have any lab test that is abnormal or we need to change your treatment, we will call you to review the results.   Testing/Procedures: None   Follow-Up: At [facility], you and your health needs are our priority.  As part of our continuing mission to provide you with exceptional heart care, we have created designated Provider Care Teams.  These Care Teams include your primary Cardiologist (physician) and Advanced Practice Providers (APPs -  Physician Assistants and Nurse Practitioners) who all work together to provide you with the care  you need, when you need it.   Your next appointment:   4 month(s)  Provider:   Bryan Lemma, MD         Marykay Lex, MD, MS Bryan Lemma, M.D., M.S. Interventional Cardiologist  [facility]   [address]; [address] [place], [place]  [address] [phone]           Fax [phone]    Thank you for choosing [facility] in [place]!!

## 2022-12-31 NOTE — Telephone Encounter (Signed)
Requested Prescriptions  Pending Prescriptions Disp Refills   ezetimibe (ZETIA) 10 MG tablet [Pharmacy Med Name: EZETIMIBE 10 MG TABLET] 90 tablet 1    Sig: TAKE 1 TABLET BY MOUTH EVERY DAY     Cardiovascular:  Antilipid - Sterol Transport Inhibitors Failed - 12/30/2022  1:30 PM      Failed - Lipid Panel in normal range within the last 12 months    Cholesterol  Date Value Ref Range Status  09/24/2022 105 0 - 200 mg/dL Final   LDL Cholesterol (Calc)  Date Value Ref Range Status  11/11/2021 103 (H) mg/dL (calc) Final    Comment:    Reference range: <100 . Desirable range <100 mg/dL for primary prevention;   <70 mg/dL for patients with CHD or diabetic patients  with > or = 2 CHD risk factors. Marland Kitchen LDL-C is now calculated using the Martin-Hopkins  calculation, which is a validated novel method providing  better accuracy than the Friedewald equation in the  estimation of LDL-C.  Horald Pollen et al. Lenox Ahr. 1610;960(45): 2061-2068  (http://education.[employer].com/faq/FAQ164)    LDL Cholesterol  Date Value Ref Range Status  09/24/2022 34 0 - 99 mg/dL Final    Comment:           Total Cholesterol/HDL:CHD Risk Coronary Heart Disease Risk Table                     Men   Women  1/2 Average Risk   3.4   3.3  Average Risk       5.0   4.4  2 X Average Risk   9.6   7.1  3 X Average Risk  23.4   11.0        Use the calculated Patient Ratio above and the CHD Risk Table to determine the patient's CHD Risk.        ATP III CLASSIFICATION (LDL):  <100     mg/dL   Optimal  409-811  mg/dL   Near or Above                    Optimal  130-159  mg/dL   Borderline  914-782  mg/dL   High  >956     mg/dL   Very High Performed at [facility], [address] [place], [place] [address]    Direct LDL  Date Value Ref Range Status  01/30/2022 98.5 0 - 99 mg/dL Final    Comment:    Performed at [facility], [address] [place], [place] [address]   HDL  Date Value Ref  Range Status  09/24/2022 49 >40 mg/dL Final   Triglycerides  Date Value Ref Range Status  09/24/2022 110 <150 mg/dL Final         Passed - AST in normal range and within 360 days    AST  Date Value Ref Range Status  09/24/2022 18 15 - 41 U/L Final         Passed - ALT in normal range and within 360 days    ALT  Date Value Ref Range Status  09/24/2022 14 0 - 44 U/L Final         Passed - Patient is not pregnant      Passed - Valid encounter within last 12 months    Recent Outpatient Visits           1 month ago Coronary artery disease involving native coronary artery of native  heart without angina pectoris   [place] [facility] [place], Salvadore Oxford, NP   2 months ago Essential hypertension   [place] [facility] [place], Gentry Fitz A, RPH-CPP   3 months ago Generalized abdominal pain   [place] [facility] [place], Salvadore Oxford, NP   4 months ago Atypical pneumonia   [place] [facility] [place], Salvadore Oxford, NP   5 months ago Essential hypertension   [place] [facility] [place], Jackelyn Poling, RPH-CPP       Future Appointments             In 4 months Richardo Hanks, Laurette Schimke, MD [facility] [place]   In 4 months [place], Salvadore Oxford, NP  [facility], [place]

## 2022-12-31 NOTE — Patient Instructions (Signed)
Medication Instructions:  Your physician has recommended you make the following change in your medication:   HOLD Plavix (Clopidogrel) on the 27th until the day after. THEN take 2 tablets once daily for 2 days THEN go back to once daily.  Take Aspirin until the 29th and then STOP.  2 weeks after weaning off of norco we will decrease propranolol to 1/2 tablet and change hydralazine to as needed for top blood pressure number greater than 160  *If you need a refill on your cardiac medications before your next appointment, please call your pharmacy*   Lab Work: Lipid, CMP, LPa to be done at the end of May. Go to the [facility] entrance and stop at registration desk. These are fasting labs so nothing to eat or drink after midnight the night before except sip of water with your medications.   If you have labs (blood work) drawn today and your tests are completely normal, you will receive your results only by: MyChart Message (if you have MyChart) OR A paper copy in the mail If you have any lab test that is abnormal or we need to change your treatment, we will call you to review the results.   Testing/Procedures: None   Follow-Up: At [facility], you and your health needs are our priority.  As part of our continuing mission to provide you with exceptional heart care, we have created designated Provider Care Teams.  These Care Teams include your primary Cardiologist (physician) and Advanced Practice Providers (APPs -  Physician Assistants and Nurse Practitioners) who all work together to provide you with the care you need, when you need it.   Your next appointment:   4 month(s)  Provider:   Bryan Lemma, MD

## 2023-01-02 ENCOUNTER — Encounter: Payer: Self-pay | Admitting: Cardiology

## 2023-01-02 DIAGNOSIS — Z01818 Encounter for other preprocedural examination: Secondary | ICD-10-CM | POA: Insufficient documentation

## 2023-01-02 NOTE — Assessment & Plan Note (Signed)
His lipids in January look great!!  Unfortunately, he is now no longer taking Repatha.  I think we have a little bit of the time to ensure that is fully out of the system, him and he is fully weaned off of Norco to avoid any other confusing symptoms.  I would then recheck lipid panel and LP(a). => Pending results anticipate referral to [facility] to discuss potentially converting to Praluent versus even inclisiran.

## 2023-01-02 NOTE — Assessment & Plan Note (Signed)
He has a lot of issues with orthostasis and dizziness. I stressed importance of dietary modification for blood pressure and lipids, but he does need to be careful to ensure adequate hydration. He is currently on propranolol 80 mg twice daily but has a lot of fatigue and exercise intolerance.  He did not tolerate hydralazine stating worsening dizziness.  Plan: DC hydralazine as a standing medication. => He will use it only PRN for sustained SBP greater than 160 mmHg.  (This means he will only take hydralazine if he has a headache or some reason to check his blood pressure and is over 160 mmHg systoliC on the initial and then secondary to check after 1 hour. Continue propranolol at 80 mg twice daily for now while he is weaning off Norco to avoid stress related palpitations etc.  However 2 weeks after fully weaning off of Norco, I would like him to reduce morning dose of propranolol to 40 mg dosing.  Continue 80 mg in the evening.

## 2023-01-02 NOTE — Assessment & Plan Note (Signed)
Upcoming procedure with pulmonary medicine weight will probably potentially have biopsies.  The plan is to hold Plavix as of April 27, and stop aspirin as of April 29. He will not restart aspirin following procedure. On postprocedure day 1 we will restart Plavix taking 2 tablets a day for 2 days and then continue 1 tablet daily going forward.

## 2023-01-02 NOTE — Telephone Encounter (Signed)
Should be addressed in my note from 4/18 -- was in AVS (I forgot to refresh to update the info in my note).  HOLD Plavix (Clopidogrel) on the 27th until the day after. THEN take 2 tablets once daily for 2 days THEN go back to once daily.  Take Aspirin until the 29th and then STOP.    Jakory Matsuo, MD  

## 2023-01-02 NOTE — Telephone Encounter (Signed)
Should be addressed in my note from 4/18 -- was in AVS (I forgot to refresh to update the info in my note).  HOLD Plavix (Clopidogrel) on the 27th until the day after. THEN take 2 tablets once daily for 2 days THEN go back to once daily.  Take Aspirin until the 29th and then STOP.    Bryan Lemma, MD

## 2023-01-02 NOTE — Assessment & Plan Note (Signed)
No further angina or CHF symptoms.  Unfortunately risk factor modification has become difficult with his intolerance of medications.  Now he is no longer taking Repatha, but we do have a little bit of leeway because his labs in January were outstanding. His blood pressures are on the high side normal to abnormally high but he is intolerant lower blood pressures.  Becomes very dizzy.  Plan: He is on propranolol which is the only beta-blocker he can tolerate, however he is having a lot of dizziness and exercise intolerance Reduce morning dose of propranolol to 40 mg and continue p.m. dose of 80 mg. (However I will wait until he is fully weaned off of his Norco to do this maneuver.  He will wait 2 weeks after fully weaning off Norco and then decrease his daytime dose) He stopped taking hydralazine and I will have him hold off on using hydralazine only for PRN purposes. Continue Zetia =>  Check lipid panel along with LP(a) along with chemistries at the end of May => anticipate that based on these results we will refer him to our lipid clinic to discuss options of replacing Repatha with potentially Praluent or even inclisiran.

## 2023-01-02 NOTE — Assessment & Plan Note (Signed)
Has been intolerant of multiple different statins including atorvastatin, rosuvastatin, pravastatin and rosuvastatin. He had myalgias but also memory issues and headaches.  Unfortunately, he was also not able to tolerate Repatha citing headaches and nausea.  This was on a rechallenge where symptoms occurred after starting back and then stopped again once discontinuing.  After rechecking lipid panel and LP(a) in the end of May, anticipate referral to [facility] ([facility] in [place])

## 2023-01-02 NOTE — Assessment & Plan Note (Signed)
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  Beyond the point where we need to have him continue on uninterrupted DAPT.  At this point I would like to reduce him to Plavix monotherapy stopping his aspirin.  For sake of ease and instructions, he will take his aspirin 81 mg until April 29 and then stop.  He will continue taking Plavix but will pause Plavix for his upcoming procedure-stopping on April 27 in preparation for his procedure), he will then restart postprocedure day 1 => the first day of restarting he will take 2 tablets a day and then go back to taking 1 tablet daily.  Summary HOLD Plavix (Clopidogrel) on April 27th until the day after. THEN take 2 tablets once daily for 2 days THEN go back to once daily.  Going forward he will continue on Plavix monotherapy but can be held 5 to 7 days preop for surgeries or procedures Take Aspirin until April 29th and then STOP -> will stay off of aspirin going forward

## 2023-01-03 NOTE — Telephone Encounter (Signed)
Note from 4/18 done - should be able to indicate that instructions to hold Plavix & ASA given.  DH

## 2023-01-04 ENCOUNTER — Telehealth: Payer: Self-pay | Admitting: Student in an Organized Health Care Education/Training Program

## 2023-01-04 NOTE — Telephone Encounter (Signed)
Patient states that Dr.Lateef explained to callback if have not heard from rep regarding stemulator trial. Advised patient Dr.Lateef is out. Could you reach out to rep regarding stimulator trial, and reach out to patient. Please give patient a call. Thanks.

## 2023-01-04 NOTE — Telephone Encounter (Signed)
   Primary Cardiologist: Bryan Lemma, MD  Chart reviewed as part of pre-operative protocol coverage. Given past medical history and time since last visit, based on ACC/AHA guidelines, Grant Young would be at acceptable risk for the planned procedure without further cardiovascular testing.   Patient was advised that if he develops new symptoms prior to surgery to contact our office to arrange a follow-up appointment.  He verbalized understanding.  Per Dr. Herbie Baltimore, below is schedule for holding anti-platelet therapy:  HOLD Plavix (Clopidogrel) on the 27th until the day after. THEN take 2 tablets once daily for 2 days THEN go back to once daily.  Take Aspirin until the 29th and then STOP.   I will route this recommendation to the requesting party via Epic fax function and remove from pre-op pool.  Please call with questions.  Levi Aland, NP-C  01/04/2023, 7:51 AM [address], [address] Office [phone] Fax [phone]

## 2023-01-04 NOTE — Telephone Encounter (Signed)
Found representative Andrew's contact, called him. He said he did not receive a message from Dr. Cherylann Ratel, but will call the patient.

## 2023-01-07 ENCOUNTER — Encounter
Admission: RE | Admit: 2023-01-07 | Discharge: 2023-01-07 | Disposition: A | Discharge status: Home / Self Care | Payer: Medicare Other | Source: Ambulatory Visit | Attending: Pulmonary Disease | Admitting: Pulmonary Disease

## 2023-01-07 ENCOUNTER — Other Ambulatory Visit: Payer: Self-pay

## 2023-01-07 DIAGNOSIS — Z5181 Encounter for therapeutic drug level monitoring: Secondary | ICD-10-CM

## 2023-01-07 DIAGNOSIS — T457X5A Adverse effect of anticoagulant antagonists, vitamin K and other coagulants, initial encounter: Secondary | ICD-10-CM

## 2023-01-07 DIAGNOSIS — Z01818 Encounter for other preprocedural examination: Secondary | ICD-10-CM

## 2023-01-07 HISTORY — DX: Anemia, unspecified: D64.9

## 2023-01-07 HISTORY — DX: Pneumonia, unspecified organism: J18.9

## 2023-01-07 HISTORY — DX: Atherosclerotic heart disease of native coronary artery without angina pectoris: I25.10

## 2023-01-07 NOTE — Patient Instructions (Addendum)
Your procedure is scheduled on: 01/15/23 - FRiday Report to the Registration Desk on the 1st floor of the [facility]. To find out your arrival time, please call [phone] between 1PM - 3PM on: 01/14/23 - Thursday If your arrival time is 6:00 am, do not arrive before that time as the Medical Mall entrance doors do not open until 6:00 am.  REMEMBER: Instructions that are not followed completely may result in serious medical risk, up to and including death; or upon the discretion of your surgeon and anesthesiologist your surgery may need to be rescheduled.  Do not eat food or drink any liquids after midnight the night before surgery.  No gum chewing or hard candies.  One week prior to surgery: Stop Anti-inflammatories (NSAIDS) such as Advil, Aleve, Ibuprofen, Motrin, Naproxen, Naprosyn and Aspirin based products such as Excedrin, Goody's Powder, BC Powder.  Stop taking beginning 01/08/23,  ANY OVER THE COUNTER supplements until after surgery.  You may take Tylenol if needed for pain up until the day of surgery.  TAKE ONLY THESE MEDICATIONS THE MORNING OF SURGERY WITH A SIP OF WATER:  RABEprazole (ACIPHEX) - (take one the night before and one on the morning of surgery - helps to prevent nausea after surgery.) 2.   nortriptyline (PAMELOR)  3.   propranolol (INDERAL)  4.   pyridostigmine (MESTINON)    -  HOLD Plavix (Clopidogrel) on April 27th until the day after. THEN take 2 tablets once daily for 2 days THEN go back to once daily.  Going forward he will continue on Plavix monotherapy but can be held 5 to 7 days preop for surgeries or procedures Take Aspirin until April 29th and then STOP -> will stay off of aspirin going forward HOLD Papaverine , Phentolamine  2 days prior to your surgery beginning 01/13/23.  No Alcohol for 24 hours before or after surgery.  No Smoking including e-cigarettes for 24 hours before surgery.  No chewable tobacco products for at least 6 hours  before surgery.  No nicotine patches on the day of surgery.  Do not use any "recreational" drugs for at least a week (preferably 2 weeks) before your surgery.  Please be advised that the combination of cocaine and anesthesia may have negative outcomes, up to and including death. If you test positive for cocaine, your surgery will be cancelled.  On the morning of surgery brush your teeth with toothpaste and water, you may rinse your mouth with mouthwash if you wish. Do not swallow any toothpaste or mouthwash.  Do not wear jewelry, make-up, hairpins, clips or nail polish.  Do not wear lotions, powders, or perfumes.   Do not shave body hair from the neck down 48 hours before surgery.  Contact lenses, hearing aids and dentures may not be worn into surgery.  Do not bring valuables to the hospital. [facility] is not responsible for any missing/lost belongings or valuables.   Notify your doctor if there is any change in your medical condition (cold, fever, infection).  Wear comfortable clothing (specific to your surgery type) to the hospital.  After surgery, you can help prevent lung complications by doing breathing exercises.  Take deep breaths and cough every 1-2 hours. Your doctor may order a device called an Incentive Spirometer to help you take deep breaths. When coughing or sneezing, hold a pillow firmly against your incision with both hands. This is called "splinting." Doing this helps protect your incision. It also decreases belly discomfort.  If you are  being admitted to the hospital overnight, leave your suitcase in the car. After surgery it may be brought to your room.  In case of increased patient census, it may be necessary for you, the patient, to continue your postoperative care in the Same Day Surgery department.  If you are being discharged the day of surgery, you will not be allowed to drive home. You will need a responsible individual to drive you home and stay with you  for 24 hours after surgery.   If you are taking public transportation, you will need to have a responsible individual with you.  Please call the Pre-admissions Testing Dept. at [phone] if you have any questions about these instructions.  Surgery Visitation Policy:  Patients having surgery or a procedure may have two visitors.  Children under the age of 49 must have an adult with them who is not the patient.  Inpatient Visitation:    Visiting hours are 7 a.m. to 8 p.m. Up to four visitors are allowed at one time in a patient room. The visitors may rotate out with other people during the day.  One visitor age 17 or older may stay with the patient overnight and must be in the room by 8 p.m.

## 2023-01-08 ENCOUNTER — Telehealth: Payer: Self-pay | Admitting: *Deleted

## 2023-01-08 MED ORDER — PROPRANOLOL HCL ER 80 MG PO CP24: 80.0000 mg | ORAL_CAPSULE | Freq: Every day | ORAL | 3 refills | Status: DC

## 2023-01-08 NOTE — Telephone Encounter (Signed)
-----   Message from Marykay Lex, MD sent at 01/04/2023  6:49 PM EDT ----- I THINK he was taking 2 tabs -- he can just take 1 tab esp If extended release DH ----- Message ----- From: Bryna Colander, RN Sent: 01/04/2023  12:13 PM EDT To: Marykay Lex, MD   ----- Message ----- From: Bryna Colander, RN Sent: 01/04/2023  12:10 PM EDT To: Bryna Colander, RN  Read your reply and thanks for the clarification on his Hydralazine. I also need to clarify the propranolol.   Order was to decrease propranolol to 1/2 tablet.  He was taking extended release capsule and unable to cut in half.   I sent in Propranolol 40 mg twice daily but it is not extended release.  There is no extending release available for that dose.   ----- Message ----- From: Marykay Lex, MD Sent: 01/03/2023   8:53 PM EDT To: Bryna Colander, RN  He was on Hydralazine 25 mg PO BID - but stopped taking it.   He should have the original Rx.  -- plan would be 1/2 tab PRN sustained SBP> 160 (I.e. recheck after 1 hour without notable drop).    Bryan Lemma, MD  ----- Message ----- From: Bryna Colander, RN Sent: 12/31/2022  12:17 PM EDT To: Marykay Lex, MD  Patients propranolol was to decrease to 1/2 tablet. That was a extended release capsule and unable to cut in half. I sent in Propranolol 40 mg twice daily but it is not extended release.   You wanted [name] to cut his hydralazine in 1/2 in as needed for SBP >160. He is not on this medication so what dose would you like him to take.    Thanks for your time and clarification.

## 2023-01-08 NOTE — Telephone Encounter (Signed)
Spoke with patient and reviewed recommendations and clarification of medications. Instructed him to stop propranolol tablets and decrease the extended release to Propranolol ER to 80 mg once a day. Both him and wife verbalized understanding with no further questions at this time.

## 2023-01-12 ENCOUNTER — Ambulatory Visit
Admission: RE | Admit: 2023-01-12 | Discharge: 2023-01-12 | Disposition: A | Discharge status: Home / Self Care | Payer: Medicare Other | Source: Ambulatory Visit | Attending: Pulmonary Disease | Admitting: Pulmonary Disease

## 2023-01-12 DIAGNOSIS — R911 Solitary pulmonary nodule: Secondary | ICD-10-CM

## 2023-01-13 ENCOUNTER — Encounter
Admission: RE | Admit: 2023-01-13 | Discharge: 2023-01-13 | Disposition: A | Discharge status: Home / Self Care | Payer: Medicare Other | Source: Ambulatory Visit | Attending: Pulmonary Disease | Admitting: Pulmonary Disease

## 2023-01-13 ENCOUNTER — Encounter: Payer: Self-pay | Admitting: Urgent Care

## 2023-01-13 DIAGNOSIS — Z7902 Long term (current) use of antithrombotics/antiplatelets: Secondary | ICD-10-CM | POA: Diagnosis not present

## 2023-01-13 DIAGNOSIS — Z1152 Encounter for screening for COVID-19: Secondary | ICD-10-CM | POA: Diagnosis not present

## 2023-01-13 DIAGNOSIS — T457X5A Adverse effect of anticoagulant antagonists, vitamin K and other coagulants, initial encounter: Secondary | ICD-10-CM | POA: Diagnosis not present

## 2023-01-13 DIAGNOSIS — Z5181 Encounter for therapeutic drug level monitoring: Secondary | ICD-10-CM | POA: Diagnosis not present

## 2023-01-13 DIAGNOSIS — Z01818 Encounter for other preprocedural examination: Secondary | ICD-10-CM | POA: Diagnosis present

## 2023-01-13 LAB — BASIC METABOLIC PANEL
Anion gap: 9 (ref 5–15)
BUN: 13 mg/dL (ref 8–23)
CO2: 30 mmol/L (ref 22–32)
Calcium: 9.3 mg/dL (ref 8.9–10.3)
Chloride: 104 mmol/L (ref 98–111)
Creatinine, Ser: 0.79 mg/dL (ref 0.61–1.24)
GFR, Estimated: >60 mL/min (ref >60)
Glucose, Bld: 104 mg/dL — ABNORMAL HIGH (ref 70–99)
Potassium: 3.4 mmol/L — ABNORMAL LOW (ref 3.5–5.1)
Sodium: 143 mmol/L (ref 135–145)

## 2023-01-13 LAB — APTT: aPTT: 25 seconds (ref 24–36)

## 2023-01-13 LAB — CBC
HCT: 47.5 % (ref 39.0–52.0)
Hemoglobin: 15.7 g/dL (ref 13.0–17.0)
MCH: 30 pg (ref 26.0–34.0)
MCHC: 33.1 g/dL (ref 30.0–36.0)
MCV: 90.8 fL (ref 80.0–100.0)
Platelets: 199 10*3/uL (ref 150–400)
RBC: 5.23 MIL/uL (ref 4.22–5.81)
RDW: 12.7 % (ref 11.5–15.5)
WBC: 5.8 10*3/uL (ref 4.0–10.5)
nRBC: 0 % (ref 0.0–0.2)

## 2023-01-13 LAB — PROTIME-INR
INR: 1 (ref 0.8–1.2)
Prothrombin Time: 12.9 seconds (ref 11.4–15.2)

## 2023-01-14 ENCOUNTER — Encounter: Payer: Self-pay | Admitting: Urgent Care

## 2023-01-14 LAB — SARS CORONAVIRUS 2 (TAT 6-24 HRS): SARS Coronavirus 2: NEGATIVE

## 2023-01-14 NOTE — Progress Notes (Signed)
Perioperative / Anesthesia Services  Pre-Admission Testing Clinical Review / Preoperative Anesthesia Consult  Date: 01/14/23  Patient Demographics:  Name: Grant Young DOB:   [birth year] MRN:   [id number]  Planned Surgical Procedure(s):    Case: [id number] Date/Time: 01/15/23 1200   Procedures:      VIDEO BRONCHOSCOPY WITH ENDOBRONCHIAL ULTRASOUND     ROBOTIC ASSISTED NAVIGATIONAL BRONCHOSCOPY   Anesthesia type: General   Pre-op diagnosis: Lung Nodule R91.1   Location: [facility] PROCEDURE RM 02 / [facility] ORS FOR ANESTHESIA GROUP   Surgeons: Vida Rigger, MD     NOTE: Available PAT nursing documentation and vital signs have been reviewed. Clinical nursing staff has updated patient's PMH/PSHx, current medication list, and drug allergies/intolerances to ensure comprehensive history available to assist in medical decision making as it pertains to the aforementioned surgical procedure and anticipated anesthetic course. Extensive review of available clinical information personally performed. [place] PMH and PSHx updated with any diagnoses/procedures that  may have been inadvertently omitted during his intake with the pre-admission testing department's nursing staff.  Clinical Discussion:  Grant Young is a 77 y.o. male who is submitted for pre-surgical anesthesia review and clearance prior to him undergoing the above procedure. Patient has never been a smoker. Pertinent PMH includes: CAD, MI x 3, ascending aortic aneurysm, aortic atherosclerosis HTN, HLD, GERD (on daily PPI), hiatal hernia, anemia, chronic back pain (s/p SCS placement), nephrolithiasis, ED ( on Bi-mix + TRT), depression, anxiety, remote substance abuse, insomnia,  Patient is followed by cardiology Herbie Baltimore, MD). He was last seen in the cardiology clinic on 12/31/2022; notes reviewed. At the time of his clinic visit, patient was experiencing multiple symptoms related to his medications.  He was experiencing vertiginous symptoms  and labile blood pressures.  Patient self adjusting medications at home.  Patient denied any episodes of chest pain, shortness of breath, PND, orthopnea, palpitations, significant peripheral edema, or presyncope/syncope.  Patient was able to appreciate elevations in his heart rate associated with exercise and anxiety. Patient with a past medical history significant for cardiovascular diagnoses. Documented physical exam was grossly benign, providing no evidence of acute exacerbation and/or decompensation of the patient's known cardiovascular conditions.  Of note, patient's complete history regarding cardiovascular history unavailable for review at time of consult.  Information gathered from patient report and from notes provided by his local cardiologist.  Patient reported to have suffered an MI in 54 and 1999.  Patient did not undergo stent placement.  Reportedly, cardiovascular events were treated with POBA alone.   Myocardial perfusion imaging study performed on 12/17/2011 revealed a normal left ventricular systolic function with an EF of 61%.  While there was image artifact present, there was no evidence of stress-induced myocardial ischemia or arrhythmia; no scintigraphic evidence of scar.  Study determined to be normal and low risk.  Patient suffered an NSTEMI on 11/27/2021.  Subsequent PCI was performed on 11/28/2021 revealing multivessel CAD; sequential 75% ostial to proximal LAD, 70% OM 2, 80% proximal to mid LAD, 99% mid LAD, and 100% proximal LCx.  PCI was performed placing a 2.5 x 26 mm Onyx Frontier DES to the proximal LCx and a 2.5 x 24 mm Onyx Frontier DES to the ostial to mid LAD.  Of note, the diffuse disease noted in the ostial to mid LAD spanned approximately 32 mm.  Stent placed at this location covered all 3 of the aforementioned lesions.  Procedure yielded excellent angiographic result and TIMI-3 flow.  Most recent TTE was performed on  11/28/2021 revealing a low normal left ventricular  systolic function with an EF of 50-55%.  Inferior lateral wall hypokinesis noted.  Left ventricular diastolic Doppler parameters were indeterminate.  Right ventricular size and function normal.  Estimated RVSP 15.2 mmHg.  Mild mitral and aortic valve regurgitation observed. All transvalvular gradients were noted to be normal providing no evidence suggestive of valvular stenosis.  Following stent placement, patient remains on daily antithrombotic therapy using clopidogrel.  Patient reported to be compliant with prescribed therapy with no reports of GI bleeding.  Blood pressure reported to be labile; documented as 118/70 in clinic.  Patient is on beta-blocker (propranolol) for tremors.  Requires pyridostigmine to help regulate his blood pressure.  Patient has had several intolerances related to medications used to control his HLD and remote further ASCVD prevention.  He is currently taking ezetimibe monotherapy, as he has been intolerant to both statins and PCSK9i (evolocumab) in the past.  Plans were for patient to retrial evolocumab.  Of note, in the setting of known cardiovascular disease, it is important to note that patient is on exogenous testosterone + alpha-blocker (phentolamine) + vasodilator (paperavine) for his erectile dysfunction diagnosis.  Patient is not diabetic.  He does not have an OSAH diagnosis.  Functional capacity somewhat limited by patient's age, chronic back pain, and other multiple medical comorbidities.  He has a nonfunctioning spinal cord stimulator in place.  With that being said, patient still felt to be able to achieve at least 4 METS of physical activity without experiencing any significant degree of angina/anginal equivalent symptoms. No changes were made to his medication regimen.  Patient to follow-up with outpatient cardiology in 4 months or sooner if needed.  Grant Young underwent CT imaging of the chest on 12/07/2022 revealing at least 2 acute areas of nodular consolidation  in the posterior medial LEFT lower pulmonary lobe. Areas of concern measured 8 x 11 mm and 8 x 10 mm.  Atypical fungal infection favored, however malignancy could not be excluded.  Patient referred to pulmonary medicine for consultation regarding tissue biopsy for definitive diagnosis purposes.  He has subsequently been scheduled for a VIDEO BRONCHOSCOPY WITH ENDOBRONCHIAL ULTRASOUND; ROBOTIC ASSISTED NAVIGATIONAL BRONCHOSCOPY on 01/15/2023 with Dr. Vida Rigger, MD.  Given patient's past medical history significant for cardiovascular diagnoses, presurgical cardiac clearance was sought by the PAT team. Per cardiology, "based ACC/AHA guidelines, the patient's past medical history, and the amount of time since his last clinic visit, this patient would be at an overall ACCEPTABLE risk for the planned procedure without further cardiovascular testing or intervention at this time".   Again, this patient is on daily DAPT therapy.  He has been instructed on recommendations for holding his clopidogrel dose for 5 days (last dose 01/09/2023) and his daily low-dose ASA for 3 days (last dose 01/11/2023) prior to his procedure with plans to restart as soon as postoperative bleeding risk felt to be minimized by his primary attending surgeon.  Patient reports previous perioperative complications with anesthesia in the past. Patient has a PMH (+) for PONV. Symptoms and history of PONV will be discussed with patient by anesthesia team on the day of her procedure. Interventions will be ordered as deemed necessary based on patient's individual care needs as determined by anesthesiologist. In review of the available records, it is noted that patient underwent a mac anesthetic course at [facility] (AS[facility]) in 01/2021 without documented complications.      12/31/2022    9:44 AM 12/29/2022  1:43 PM 12/29/2022    1:05 PM  Vitals with BMI  Height 5\' 9"   5\' 9"   Weight 142 lbs  142 lbs  BMI 20.96  20.96  Systolic  118 165 192  Diastolic 70 85 90  Pulse 65  74    Providers/Specialists:   NOTE: Primary physician provider listed below. Patient may have been seen by APP or partner within same practice.   PROVIDER ROLE / SPECIALTY LAST Yolanda Manges, MD Consulting Pulmonary Medicine (Surgeon) ???  Lorre Munroe, NP Primary Care Provider 11/20/2022  Lakela Kuba Lemma, MD Cardiology 12/31/2022  Theora Master, MD Neurology 12/14/2022  Ned Clines, MD Pulmonary Medicine 12/15/2022  Edward Jolly, MD Pain Management 12/29/2022   Allergies:  Ace inhibitors, Fluoxetine, Metoclopramide, Nalbuphine, Other, Amoxicillin-pot clavulanate, Doxazosin, Duloxetine, Penicillins, Tamsulosin, Trazodone and nefazodone, Amlodipine, Cinoxacin, Ciprofloxacin, Fludrocortisone, Nebivolol, Olanzapine, Olmesartan, Pregabalin, Prostaglandins, Thyroid hormones, Zolpidem, Duloxetine hcl, Fluoxetine hcl, Nucynta [tapentadol], and Phenytoin  Current Home Medications:   No current facility-administered medications for this encounter.    anastrozole (ARIMIDEX) 1 MG tablet   butalbital-acetaminophen-caffeine (FIORICET) 50-325-40 MG tablet   cetirizine (ZYRTEC) 10 MG tablet   Cholecalciferol 50 MCG (2000 UT) CAPS   clopidogrel (PLAVIX) 75 MG tablet   Cyanocobalamin (VITAMIN B-12) 5000 MCG LOZG   DHEA 25 MG CAPS   diazepam (VALIUM) 5 MG tablet   dicyclomine (BENTYL) 10 MG capsule   Digestive Aids Mixture (DIGESTION GB PO)   ezetimibe (ZETIA) 10 MG tablet   ferrous sulfate 325 (65 FE) MG tablet   HYDROcodone-acetaminophen (NORCO/VICODIN) 5-325 MG tablet   ipratropium (ATROVENT) 0.06 % nasal spray   Loperamide HCl (IMODIUM PO)   MAGNESIUM BISGLYCINATE PO   melatonin 5 MG TABS   NONFORMULARY OR COMPOUNDED ITEM   nortriptyline (PAMELOR) 10 MG capsule   Nutritional Supplements (NUTRITIONAL SUPPLEMENT PO)   polyethylene glycol (MIRALAX / GLYCOLAX) 17 g packet   POTASSIUM PO   Probiotic Product (PROBIOTIC DAILY PO)    prochlorperazine (COMPAZINE) 10 MG tablet   propranolol ER (INDERAL LA) 80 MG 24 hr capsule   pyridostigmine (MESTINON) 60 MG tablet   RABEprazole (ACIPHEX) 20 MG tablet   sucralfate (CARAFATE) 1 g tablet   testosterone cypionate (DEPOTESTOSTERONE CYPIONATE) 200 MG/ML injection   valACYclovir (VALTREX) 1000 MG tablet   venlafaxine XR (EFFEXOR XR) 75 MG 24 hr capsule   History:   Past Medical History:  Diagnosis Date   Anemia    Aneurysm of ascending aorta (HCC)    a.) CT chest 08/18/2022: 4.1 cm; b.) CT chest 12/07/2022: 4.2 cm   Anxiety    a.) on BZO (diazepam) PRN   Aortic atherosclerosis (HCC)    Chronic back pain    Coronary artery disease    a.) s/p PCI with DES x 2 (pLCx and o-mLAD) 11/27/2021   Depression    Erectile dysfunction    a.) Bi-mix (papaverine + phentolamine) injections + exogenous testosterone injections   GERD (gastroesophageal reflux disease)    h/o Lyme disease    Headache    Hiatal hernia    History of bilateral cataract extraction 01/2021   History of gastritis    History of kidney stones    History of neuropathy    Hyperlipidemia LDL goal <70    a.) CAD with NSTEMI -->  intolerant of statins with statin myopathy and memory issues.;  Also intolerant of Repatha   Hypertension    Labile blood pressures but dizziness with blood pressures in the "normal range";  intolerant of most medications including ARB's, ACE inhibitor's, amlodipine most beta-blockers other than propranolol.   Insomnia    a.) takes malatonin PRN   Left lower lobe pulmonary nodule    a.) chest CT 12/07/2022: nodules x 2 posteromedial LLL;  8 x 11 mm and 8 x 10 mm   Long term current use of antithrombotics/antiplatelets    a.) DAPT (ASA + clopidogrel)   Long term current use of aromatase inhibitor    a,) anastrozole --> estridiol suppression secondary to exogenous testosterone use   Lumbar radicular pain    Myocardial infarction (HCC)    a.) MI x 2 - 1990 * 1999 - PTCA   NSTEMI  (non-ST elevated myocardial infarction) (HCC) 11/27/2021   a.) LHC 11/28/2021: sequential 75% o-pLAD, 70% OM2, 80% p-mLAD, 99% mLAD, 100% pLCx --> PCI placing a 2.5 x 26 mm Onyx Frontier DES to pLCx and a 2.5 x 24 mm Onyx Frontier DES to the o-m LAD (covering 3 lesions)   Overactive bladder    Pneumonia    PONV (postoperative nausea and vomiting)    Recurrent herpes labialis    a.) has suppressive valacyclovir to use PRN   Skin cancer, basal cell    Spinal cord stimulator status    01/08/21 - not currently using.   Squamous cell skin cancer    Substance abuse [facility])    Past Surgical History:  Procedure Laterality Date   BACK SURGERY     BASAL CELL CARCINOMA EXCISION     CATARACT EXTRACTION W/PHACO Left 01/14/2021   Procedure: CATARACT EXTRACTION PHACO AND INTRAOCULAR LENS PLACEMENT (IOC) LEFT VIVITY TORIC LENS 8.75 00:56.7;  Surgeon: Galen Manila, MD;  Location: [facility];  Service: Ophthalmology;  Laterality: Left;   CATARACT EXTRACTION W/PHACO Right 01/28/2021   Procedure: CATARACT EXTRACTION PHACO AND INTRAOCULAR LENS PLACEMENT (IOC) RIGHT VIVITY TORIC LENS;  Surgeon: Galen Manila, MD;  Location: [facility];  Service: Ophthalmology;  Laterality: Right;  6.54 00:46.4   CHOLECYSTECTOMY  2017   COLONOSCOPY  09/15/2015   Per New Patient Packet   COLONOSCOPY WITH PROPOFOL N/A 10/03/2020   Procedure: COLONOSCOPY WITH PROPOFOL;  Surgeon: Wyline Mood, MD;  Location: [facility] END[facility]COPY;  Service: Gastroenterology;  Laterality: N/A;   CORONARY/GRAFT ACUTE MI REVASCULARIZATION N/A 11/27/2021   Procedure: Coronary/Graft Acute MI Revascularization;  Surgeon: Marykay Lex, MD;  Location: [facility] CATH: (NSTEMI) - > 100% prox-mid LCx (Onyx Frontier DES 2.5 x 26 -> 2.7 mm, Ost  OM1 65%.  Ost-mid LAD 3 lesions 75%, 90%, 99% => DES PCI Onyx Frontier DES 2.5 x 34 -> 2.8 mm   ESOPHAGOGASTRODUODENOSCOPY (EGD) WITH PROPOFOL N/A 10/03/2020   Procedure: ESOPHAGOGASTRODUODENOSCOPY  (EGD) WITH PROPOFOL;  Surgeon: Wyline Mood, MD;  Location[facility] END[facility]COPY;  Service: Gastroenterology;  Laterality: N/A;   FOOT SURGERY Bilateral 03/18/2020   FOOT SURGERY  08/032021   GALLBLADDER SURGERY  09/15/2015   Gallbladder Removal. Procedure done by Dr.Beverly. Per New Patient Packet   KIDNEY STONE SURGERY  09/14/1980   Too many to count. Per New Patient Packet 09/14/1980-09/15/1995   KIDNEY STONE SURGERY  09/15/1995   Too many to count. Per New Patient Packet   LEFT HEART CATH AND CORONARY ANGIOGRAPHY N/A 11/27/2021   Procedure: LEFT HEART CATH AND CORONARY ANGIOGRAPHY;  Surgeon: Marykay Lex, MD;  [facility] CATH:  NSTEMI - 2 V CAD: 100% thrombotic prox-mid LCx (DES PCI), ost OM1 65%; Ost LAD 75% - prox LAD 90%, prox-mid 99% (DES PCI).  MIld diffuse RCA disease - calcicifed.  R-L collaterals filling LCx. EF ~45-50% with lateral HK. LVEDP 28 mmHg   LITHOTRIPSY  09/14/2014   Per New Patient Packet   LITHOTRIPSY  09/14/1996   No Stints Used. Per New Patient Packet   LITHOTRIPSY  09/14/1997   No Stints used. Per New Patient Packet   PAIN PUMP IMPLANTATION     PAIN PUMP REMOVAL     SHOULDER SURGERY Right 1984   SIGMOIDOSCOPY  09/14/2017   Per New Patient Packet   SPINAL CORD STIMULATOR IMPLANT     TONSILLECTOMY     TRANSTHORACIC ECHOCARDIOGRAM  11/28/2021   (in setting of non-STEMI): EF 50 to 55%.  Suspect inferior and lateral hypokinesis.  Indeterminate diastolic parameters.  Normal RV size and function.  Normal RAP.  Normal aortic and mitral valves with only mild MR and AI.  Normal RVP and RAP.   UPPER GI ENDOSCOPY     Family History  Problem Relation Age of Onset   Heart disease Father    Heart failure Father    Hypertension Father    Stroke Father    Stroke Mother    Dementia Mother    Kidney Stones Daughter    Anxiety disorder Daughter    OCD Daughter    Social History   Tobacco Use   Smoking status: Never    Passive exposure: Never   Smokeless tobacco:  Never  Vaping Use   Vaping Use: Never used  Substance Use Topics   Alcohol use: Yes    Comment: 1 Drink a Month, socially   Drug use: Not Currently    Pertinent Clinical Results:  LABS:   Hospital Outpatient Visit on 01/13/2023  Component Date Value Ref Range Status   Sodium 01/13/2023 143  135 - 145 mmol/L Final   Potassium 01/13/2023 3.4 (L)  3.5 - 5.1 mmol/L Final   Chloride 01/13/2023 104  98 - 111 mmol/L Final   CO2 01/13/2023 30  22 - 32 mmol/L Final   Glucose, Bld 01/13/2023 104 (H)  70 - 99 mg/dL Final   Glucose reference range applies only to samples taken after fasting for at least 8 hours.   BUN 01/13/2023 13  8 - 23 mg/dL Final   Creatinine, Ser 01/13/2023 0.79  0.61 - 1.24 mg/dL Final   Calcium 16/06/9603 9.3  8.9 - 10.3 mg/dL Final   GFR, Estimated 01/13/2023 >60  >60 mL/min Final   Comment: (NOTE) Calculated using the CKD-EPI Creatinine Equation (2021)    Anion gap 01/13/2023 9  5 - 15 Final   Performed at [facility], [address] [place], [place] [address]   Prothrombin Time 01/13/2023 12.9  11.4 - 15.2 seconds Final   INR 01/13/2023 1.0  0.8 - 1.2 Final   Comment: (NOTE) INR goal varies based on device and disease states. Performed at [facility], [address] [place], [place] [address]    WBC 01/13/2023 5.8  4.0 - 10.5 K/uL Final   RBC 01/13/2023 5.23  4.22 - 5.81 MIL/uL Final   Hemoglobin 01/13/2023 15.7  13.0 - 17.0 g/dL Final   HCT 78/29/5621 47.5  39.0 - 52.0 % Final   MCV 01/13/2023 90.8  80.0 - 100.0 fL Final   MCH 01/13/2023 30.0  26.0 - 34.0 pg Final   MCHC 01/13/2023 33.1  30.0 - 36.0 g/dL Final   RDW 30/86/5784 12.7  11.5 - 15.5 % Final   Platelets 01/13/2023 199  150 -  400 K/uL Final   nRBC 01/13/2023 0.0  0.0 - 0.2 % Final   Performed at [facility], [address] [place], [place] [address]   aPTT 01/13/2023 25  24 - 36 seconds Final   Performed at [facility], [address]  [place], [place] [address]   SARS Coronavirus 2 01/13/2023 NEGATIVE  NEGATIVE Final   Comment: (NOTE) SARS-CoV-2 target nucleic acids are NOT DETECTED.  The SARS-CoV-2 RNA is generally detectable in upper and lower respiratory specimens during the acute phase of infection. Negative results do not preclude SARS-CoV-2 infection, do not rule out co-infections with other pathogens, and should not be used as the sole basis for treatment or other patient management decisions. Negative results must be combined with clinical observations, patient history, and epidemiological information. The expected result is Negative.  Fact Sheet for Patients: HairSlick.no  Fact Sheet for Healthcare Providers: quierodirigir.com  This test is not yet approved or cleared by the [place] FDA and  has been authorized for detection and/or diagnosis of SARS-CoV-2 by FDA under an Emergency Use Authorization (EUA). This EUA will remain  in effect (meaning this test can be used) for the duration of the COVID-19 declaration under Section 564(b)(1) of the Act, 21 U.S.C. section 360bbb-3(b)(1), unless the authorization is terminated or revoked sooner.  Performed at [facility], [address], [place], [place] [address]    ECG: Date: 12/31/2022 Time ECG obtained: 0956 AM Rate: 65 bpm Rhythm: normal sinus Axis (leads I and aVF): Normal Intervals: PR 198 ms. QRS 98 ms. QTc 427 ms. ST segment and T wave changes: No evidence of acute ST segment elevation or depression Comparison: Similar to previous tracing obtained on 08/26/2022   IMAGING / PROCEDURES: CT SUPER D CHEST WO MONARCH PILOT performed on 01/12/2023 Results pending at time of note dictation.   CT CHEST WO CONTRAST performed on 12/07/2022 Extensive peribronchovascular nodularity, ground-glass, bronchiectasis and architectural distortion, overall more organized than on 08/18/2022 and  indicative of post infectious scarring. At least 2 new areas of nodular consolidation in the posteromedial left lower lobe, suggesting an ongoing atypical or fungal infectious process. Consider follow-up CT chest without contrast in 3 months in further evaluation, as clinically indicated, as malignancy cannot be definitively excluded. 4.2 cm ascending aortic aneurysm. Recommend annual imaging followup by CTA or MRA. This recommendation follows 2010 ACCF/AHA/AATS/ACR/ASA/SCA/SCAI/SIR/STS/SVM Guidelines for the Diagnosis and Management of Patients with Thoracic Aortic Disease. Circulation. 2010; 121: V425-Z563. Aortic aneurysm NOS (ICD10-I71.9). Left renal stones. Aortic atherosclerosis Coronary artery calcifications  TRANSTHORACIC ECHOCARDIOGRAM performed on 11/28/2021 Left ventricular ejection fraction, by estimation, is 50 to 55%. The left ventricle has low normal function. The left ventricle demonstrates regional wall motion abnormalities (select images with concern for inferior and lateral wall hypokinesis). Left ventricular diastolic parameters are indeterminate.  Right ventricular systolic function is normal. The right ventricular size is normal. There is normal pulmonary artery systolic pressure. The estimated right ventricular systolic pressure is 15.2 mmHg.  The mitral valve is normal in structure. Mild mitral valve regurgitation. No evidence of mitral stenosis.  The aortic valve is normal in structure. Aortic valve regurgitation is mild. No aortic stenosis is present.  The inferior vena cava is normal in size with greater than 50% respiratory variability, suggesting right atrial pressure of 3 mmHg.   LEFT HEART CATHETERIZATION AND CORONARY ANGIOGRAPHY performed on 11/28/2021 Acute Coronary Syndrome / NSTEMI (with STEMI physiology) Severe two-vessel CAD Culprit lesion 100% occluded proximal and mid  LCx  Sequential ostial proximal LAD 75, 80 to 99% stenosis Successful revascularization of  occluded LCx with Onyx Frontier 2.5 mm x 26 mm deployed to 2.7 mm.  TIMI 0 flow improved to TIMI-3.  Also TIMI-3 flow noted in major/small caliber OM branch with an ostial 60 to 70% stenosis (not PCI during Successful ostial to mid LAD PCI (3 tandem lesions ~total length 32 mmm) covering the total lesion segment with an Onyx Frontier 2.5 mm x 34 mm-deployed/postdilated to 2.8 mm Heavily calcified but widely patent large dominant RCA that did provide collaterals to the occluded LCx Mildly reduced EF with lateral hypokinesis and significant elevated LVEDP of 28 mmHg     Impression and Plan:  ASHLAND WISEMAN has been referred for pre-anesthesia review and clearance prior to him undergoing the planned anesthetic and procedural courses. Available labs, pertinent testing, and imaging results were personally reviewed by me in preparation for upcoming operative/procedural course. [facility] medical record has been updated following extensive record review and patient interview with PAT staff.   This patient has been appropriately cleared by cardiology with an overall ACCEPTABLE risk of significant perioperative cardiovascular complications. Based on clinical review performed today (01/14/23), barring any significant acute changes in the patient's overall condition, it is anticipated that he will be able to proceed with the planned surgical intervention. Any acute changes in clinical condition may necessitate his procedure being postponed and/or cancelled. Patient will meet with anesthesia team (MD and/or CRNA) on the day of his procedure for preoperative evaluation/assessment. Questions regarding anesthetic course will be fielded at that time.   Pre-surgical instructions were reviewed with the patient during his PAT appointment, and questions were fielded to satisfaction by PAT clinical staff. He has been instructed on which medications that he will need to hold prior to surgery, as well as the ones that have  been deemed safe/appropriate to take on the day of his procedure. As part of the general education provided by PAT, patient made aware both verbally and in writing, that he would need to abstain from the use of any illegal substances during his perioperative course.  He was advised that failure to follow the provided instructions could necessitate case cancellation or result in serious perioperative complications up to and including death. Patient encouraged to contact PAT and/or his surgeon's office to discuss any questions or concerns that may arise prior to surgery; verbalized understanding.   Quentin Mulling, MSN, APRN, FNP-C, CEN [facility]  Peri-operative Services Nurse Practitioner Phone: [phone] Fax: [id number] 01/14/23 12:49 PM  NOTE: This note has been prepared using Dragon dictation software. Despite my best ability to proofread, there is always the potential that unintentional transcriptional errors may still occur from this process.

## 2023-01-15 ENCOUNTER — Ambulatory Visit: Payer: Medicare Other | Admitting: Urgent Care

## 2023-01-15 ENCOUNTER — Ambulatory Visit
Admission: RE | Admit: 2023-01-15 | Discharge: 2023-01-15 | Disposition: A | Discharge status: Home / Self Care | Payer: Medicare Other | Attending: Pulmonary Disease | Admitting: Pulmonary Disease

## 2023-01-15 ENCOUNTER — Ambulatory Visit: Payer: Medicare Other

## 2023-01-15 ENCOUNTER — Encounter
Admission: RE | Disposition: A | Discharge status: Home / Self Care | Payer: Self-pay | Source: Home / Self Care | Attending: Pulmonary Disease

## 2023-01-15 ENCOUNTER — Other Ambulatory Visit: Payer: Self-pay

## 2023-01-15 DIAGNOSIS — Z955 Presence of coronary angioplasty implant and graft: Secondary | ICD-10-CM | POA: Diagnosis not present

## 2023-01-15 DIAGNOSIS — I252 Old myocardial infarction: Secondary | ICD-10-CM | POA: Insufficient documentation

## 2023-01-15 DIAGNOSIS — Z951 Presence of aortocoronary bypass graft: Secondary | ICD-10-CM | POA: Insufficient documentation

## 2023-01-15 DIAGNOSIS — I2510 Atherosclerotic heart disease of native coronary artery without angina pectoris: Secondary | ICD-10-CM | POA: Insufficient documentation

## 2023-01-15 DIAGNOSIS — I10 Essential (primary) hypertension: Secondary | ICD-10-CM | POA: Insufficient documentation

## 2023-01-15 DIAGNOSIS — K219 Gastro-esophageal reflux disease without esophagitis: Secondary | ICD-10-CM | POA: Insufficient documentation

## 2023-01-15 DIAGNOSIS — J8410 Pulmonary fibrosis, unspecified: Secondary | ICD-10-CM | POA: Diagnosis not present

## 2023-01-15 DIAGNOSIS — R918 Other nonspecific abnormal finding of lung field: Secondary | ICD-10-CM | POA: Diagnosis present

## 2023-01-15 DIAGNOSIS — D649 Anemia, unspecified: Secondary | ICD-10-CM | POA: Insufficient documentation

## 2023-01-15 DIAGNOSIS — Z8249 Family history of ischemic heart disease and other diseases of the circulatory system: Secondary | ICD-10-CM | POA: Insufficient documentation

## 2023-01-15 HISTORY — DX: Herpesviral vesicular dermatitis: B00.1

## 2023-01-15 HISTORY — DX: Long term (current) use of aromatase inhibitors: Z79.811

## 2023-01-15 HISTORY — DX: Long term (current) use of antithrombotics/antiplatelets: Z79.02

## 2023-01-15 HISTORY — DX: Diaphragmatic hernia without obstruction or gangrene: K44.9

## 2023-01-15 HISTORY — DX: Atherosclerosis of aorta: I70.0

## 2023-01-15 HISTORY — PX: VIDEO BRONCHOSCOPY WITH ENDOBRONCHIAL ULTRASOUND: SHX6177

## 2023-01-15 HISTORY — DX: Solitary pulmonary nodule: R91.1

## 2023-01-15 HISTORY — DX: Male erectile dysfunction, unspecified: N52.9

## 2023-01-15 HISTORY — DX: Aneurysm of the ascending aorta, without rupture: I71.21

## 2023-01-15 LAB — CULTURE, RESPIRATORY W GRAM STAIN

## 2023-01-15 SURGERY — BRONCHOSCOPY, WITH EBUS
Anesthesia: General

## 2023-01-15 MED ORDER — EPHEDRINE SULFATE (PRESSORS) 50 MG/ML IJ SOLN
INTRAMUSCULAR | Status: DC | PRN
  Administered 2023-01-15 (×2): 5 mg via INTRAVENOUS

## 2023-01-15 MED ORDER — CHLORHEXIDINE GLUCONATE 0.12 % MT SOLN
OROMUCOSAL | Status: AC
  Filled 2023-01-15: qty 15

## 2023-01-15 MED ORDER — SUGAMMADEX SODIUM 200 MG/2ML IV SOLN
INTRAVENOUS | Status: DC | PRN
  Administered 2023-01-15: 200 mg via INTRAVENOUS

## 2023-01-15 MED ORDER — FENTANYL CITRATE (PF) 100 MCG/2ML IJ SOLN
INTRAMUSCULAR | Status: DC | PRN
  Administered 2023-01-15: 25 ug via INTRAVENOUS
  Administered 2023-01-15: 75 ug via INTRAVENOUS

## 2023-01-15 MED ORDER — LIDOCAINE HCL (PF) 1 % IJ SOLN: 30.0000 mL | Freq: Once | INTRAMUSCULAR | Status: DC

## 2023-01-15 MED ORDER — PHENYLEPHRINE HCL 0.25 % NA SOLN
1.0000 | Freq: Four times a day (QID) | NASAL | Status: DC | PRN
  Filled 2023-01-15: qty 15

## 2023-01-15 MED ORDER — LIDOCAINE HCL (PF) 2 % IJ SOLN
INTRAMUSCULAR | Status: AC
  Filled 2023-01-15: qty 5

## 2023-01-15 MED ORDER — ONDANSETRON HCL 4 MG/2ML IJ SOLN: 4.0000 mg | Freq: Once | INTRAMUSCULAR | Status: DC | PRN

## 2023-01-15 MED ORDER — ORAL CARE MOUTH RINSE: 15.0000 mL | Freq: Once | OROMUCOSAL | Status: AC

## 2023-01-15 MED ORDER — ONDANSETRON HCL 4 MG/2ML IJ SOLN
INTRAMUSCULAR | Status: DC | PRN
  Administered 2023-01-15: 4 mg via INTRAVENOUS

## 2023-01-15 MED ORDER — DEXAMETHASONE SODIUM PHOSPHATE 10 MG/ML IJ SOLN
INTRAMUSCULAR | Status: DC | PRN
  Administered 2023-01-15: 5 mg via INTRAVENOUS

## 2023-01-15 MED ORDER — PHENYLEPHRINE 80 MCG/ML (10ML) SYRINGE FOR IV PUSH (FOR BLOOD PRESSURE SUPPORT)
PREFILLED_SYRINGE | INTRAVENOUS | Status: AC
  Filled 2023-01-15: qty 10

## 2023-01-15 MED ORDER — DEXAMETHASONE SODIUM PHOSPHATE 10 MG/ML IJ SOLN
INTRAMUSCULAR | Status: AC
  Filled 2023-01-15: qty 1

## 2023-01-15 MED ORDER — PROPOFOL 10 MG/ML IV BOLUS
INTRAVENOUS | Status: AC
  Filled 2023-01-15: qty 20

## 2023-01-15 MED ORDER — ROCURONIUM BROMIDE 100 MG/10ML IV SOLN
INTRAVENOUS | Status: DC | PRN
  Administered 2023-01-15: 50 mg via INTRAVENOUS

## 2023-01-15 MED ORDER — CHLORHEXIDINE GLUCONATE 0.12 % MT SOLN
15.0000 mL | Freq: Once | OROMUCOSAL | Status: AC
  Administered 2023-01-15: 15 mL via OROMUCOSAL

## 2023-01-15 MED ORDER — LIDOCAINE HCL (CARDIAC) PF 100 MG/5ML IV SOSY
PREFILLED_SYRINGE | INTRAVENOUS | Status: DC | PRN
  Administered 2023-01-15: 40 mg via INTRAVENOUS

## 2023-01-15 MED ORDER — ONDANSETRON HCL 4 MG/2ML IJ SOLN
INTRAMUSCULAR | Status: AC
  Filled 2023-01-15: qty 2

## 2023-01-15 MED ORDER — PROPOFOL 10 MG/ML IV BOLUS
INTRAVENOUS | Status: DC | PRN
  Administered 2023-01-15: 150 mg via INTRAVENOUS
  Administered 2023-01-15: 20 mg via INTRAVENOUS

## 2023-01-15 MED ORDER — FENTANYL CITRATE (PF) 100 MCG/2ML IJ SOLN
INTRAMUSCULAR | Status: AC
  Filled 2023-01-15: qty 2

## 2023-01-15 MED ORDER — LIDOCAINE HCL URETHRAL/MUCOSAL 2 % EX GEL
1.0000 | Freq: Once | CUTANEOUS | Status: DC
  Filled 2023-01-15: qty 5

## 2023-01-15 MED ORDER — LABETALOL HCL 5 MG/ML IV SOLN
INTRAVENOUS | Status: DC | PRN
  Administered 2023-01-15: 10 mg via INTRAVENOUS

## 2023-01-15 MED ORDER — METOPROLOL TARTRATE 5 MG/5ML IV SOLN
INTRAVENOUS | Status: DC | PRN
  Administered 2023-01-15: 1 mg via INTRAVENOUS

## 2023-01-15 MED ORDER — BUTAMBEN-TETRACAINE-BENZOCAINE 2-2-14 % EX AERO
1.0000 | INHALATION_SPRAY | Freq: Once | CUTANEOUS | Status: DC
  Filled 2023-01-15: qty 20

## 2023-01-15 MED ORDER — LACTATED RINGERS IV SOLN: INTRAVENOUS | Status: DC

## 2023-01-15 MED ORDER — FENTANYL CITRATE (PF) 100 MCG/2ML IJ SOLN: 25.0000 ug | INTRAMUSCULAR | Status: DC | PRN

## 2023-01-15 MED ORDER — EPHEDRINE 5 MG/ML INJ
INTRAVENOUS | Status: AC
  Filled 2023-01-15: qty 5

## 2023-01-15 MED ORDER — PHENYLEPHRINE HCL (PRESSORS) 10 MG/ML IV SOLN
INTRAVENOUS | Status: DC | PRN
  Administered 2023-01-15 (×2): 100 ug via INTRAVENOUS

## 2023-01-15 NOTE — Transfer of Care (Signed)
Immediate Anesthesia Transfer of Care Note  Patient: Grant Young  Procedure(s) Performed: VIDEO BRONCHOSCOPY WITH ENDOBRONCHIAL ULTRASOUND ROBOTIC ASSISTED NAVIGATIONAL BRONCHOSCOPY  Patient Location: PACU  Anesthesia Type:General  Level of Consciousness: drowsy  Airway & Oxygen Therapy: Patient Spontanous Breathing and Patient connected to face mask oxygen  Post-op Assessment: Report given to RN and Post -op Vital signs reviewed and stable  Post vital signs: Reviewed and stable  Last Vitals:  Vitals Value Taken Time  BP 151/95 01/15/23 1357  Temp 36.2 C 01/15/23 1357  Pulse 81 01/15/23 1359  Resp 15 01/15/23 1359  SpO2 100 % 01/15/23 1359  Vitals shown include unvalidated device data.  Last Pain:  Vitals:   01/15/23 1104  PainSc: 0-No pain      Patients Stated Pain Goal: 0 (01/15/23 1104)  Complications: No notable events documented.

## 2023-01-15 NOTE — Anesthesia Preprocedure Evaluation (Addendum)
Anesthesia Evaluation  Patient identified by MRN, date of birth, ID band Patient awake    Reviewed: Allergy & Precautions, NPO status , Patient's Chart, lab work & pertinent test results  History of Anesthesia Complications (+) PONV and history of anesthetic complications  Airway Mallampati: III  TM Distance: >3 FB Neck ROM: full  Mouth opening: Limited Mouth Opening  Dental  (+) Teeth Intact   Pulmonary neg pulmonary ROS, shortness of breath and with exertion   Pulmonary exam normal  + decreased breath sounds      Cardiovascular Exercise Tolerance: Poor hypertension, Pt. on medications + CAD, + Past MI, + Cardiac Stents and + CABG  Normal cardiovascular exam Rhythm:Regular Rate:Normal     Neuro/Psych  Headaches  Anxiety Depression    negative neurological ROS  negative psych ROS   GI/Hepatic negative GI ROS, Neg liver ROS, hiatal hernia,GERD  Medicated,,  Endo/Other  negative endocrine ROS    Renal/GU negative Renal ROS     Musculoskeletal   Abdominal Normal abdominal exam  (+)   Peds negative pediatric ROS (+)  Hematology negative hematology ROS (+) Blood dyscrasia, anemia   Anesthesia Other Findings Past Medical History: No date: Anemia No date: Aneurysm of ascending aorta (HCC)     Comment:  a.) CT chest 08/18/2022: 4.1 cm; b.) CT chest               12/07/2022: 4.2 cm No date: Anxiety     Comment:  a.) on BZO (diazepam) PRN No date: Aortic atherosclerosis (HCC) No date: Chronic back pain No date: Coronary artery disease     Comment:  a.) s/p PCI with DES x 2 (pLCx and o-mLAD) 11/27/2021 No date: Depression No date: Erectile dysfunction     Comment:  a.) Bi-mix (papaverine + phentolamine) injections +               exogenous testosterone injections No date: GERD (gastroesophageal reflux disease) No date: h/o Lyme disease No date: Headache No date: Hiatal hernia 01/2021: History of bilateral  cataract extraction No date: History of gastritis No date: History of kidney stones No date: History of neuropathy No date: Hyperlipidemia LDL goal <70     Comment:  a.) CAD with NSTEMI -->  intolerant of statins with               statin myopathy and memory issues.;  Also intolerant of               Repatha No date: Hypertension     Comment:  Labile blood pressures but dizziness with blood               pressures in the "normal range"; intolerant of most               medications including ARB's, ACE inhibitor's, amlodipine               most beta-blockers other than propranolol. No date: Insomnia     Comment:  a.) takes malatonin PRN No date: Left lower lobe pulmonary nodule     Comment:  a.) chest CT 12/07/2022: nodules x 2 posteromedial LLL;               8 x 11 mm and 8 x 10 mm No date: Long term current use of antithrombotics/antiplatelets     Comment:  a.) DAPT (ASA + clopidogrel) No date: Long term current use of aromatase inhibitor     Comment:  a,) anastrozole -->  estridiol suppression secondary to               exogenous testosterone use No date: Lumbar radicular pain No date: Myocardial infarction [facility])     Comment:  a.) MI x 2 - 1990 * 1999 - PTCA 11/27/2021: NSTEMI (non-ST elevated myocardial infarction) [facility])     Comment:  a.) LHC 11/28/2021: sequential 75% o-pLAD, 70% OM2, 80%               p-mLAD, 99% mLAD, 100% pLCx --> PCI placing a 2.5 x 26 mm              Onyx Frontier DES to pLCx and a 2.5 x 24 mm Onyx Frontier              DES to the o-m LAD (covering 3 lesions) No date: Overactive bladder No date: Pneumonia No date: PONV (postoperative nausea and vomiting) No date: Recurrent herpes labialis     Comment:  a.) has suppressive valacyclovir to use PRN No date: Skin cancer, basal cell No date: Spinal cord stimulator status     Comment:  01/08/21 - not currently using. No date: Squamous cell skin cancer No date: Substance abuse [facility])  Past Surgical  History: No date: BACK SURGERY No date: BASAL CELL CARCINOMA EXCISION 01/14/2021: CATARACT EXTRACTION W/PHACO; Left     Comment:  Procedure: CATARACT EXTRACTION PHACO AND INTRAOCULAR               LENS PLACEMENT (IOC) LEFT VIVITY TORIC LENS 8.75 00:56.7;              Surgeon: Galen Manila, MD;  Location: [facility];  Service: Ophthalmology;  Laterality: Left; 01/28/2021: CATARACT EXTRACTION W/PHACO; Right     Comment:  Procedure: CATARACT EXTRACTION PHACO AND INTRAOCULAR               LENS PLACEMENT (IOC) RIGHT VIVITY TORIC LENS;  Surgeon:               Galen Manila, MD;  Location: [facility];                Service: Ophthalmology;  Laterality: Right;                6.54 00:46.4 2017: CHOLECYSTECTOMY 09/15/2015: COLONOSCOPY     Comment:  Per New Patient Packet 10/03/2020: COLONOSCOPY WITH PROPOFOL; N/A     Comment:  Procedure: COLONOSCOPY WITH PROPOFOL;  Surgeon: Wyline Mood, MD;  Location: [facility] ENDOSCOPY;  Service:               Gastroenterology;  Laterality: N/A; 11/27/2021: CORONARY/GRAFT ACUTE MI REVASCULARIZATION; N/A     Comment:  Procedure: Coronary/Graft Acute MI Revascularization;                Surgeon: Marykay Lex, MD;  Location: [facility] CATH:               (NSTEMI) - > 100% prox-mid LCx (Onyx Frontier DES 2.5 x               26 -> 2.7 mm, Ost  OM1 65%.  Ost-mid LAD 3 lesions 75%,               90%, 99% => DES PCI Onyx Frontier DES 2.5 x 34 -> 2.8 mm 10/03/2020: ESOPHAGOGASTRODUODENOSCOPY (  EGD) WITH PROPOFOL; N/A     Comment:  Procedure: ESOPHAGOGASTRODUODENOSCOPY (EGD) WITH               PROPOFOL;  Surgeon: Wyline Mood, MD;  Location: [facility]               ENDOSCOPY;  Service: Gastroenterology;  Laterality: N/A; 03/18/2020: FOOT SURGERY; Bilateral 08/032021: FOOT SURGERY 09/15/2015: GALLBLADDER SURGERY     Comment:  Gallbladder Removal. Procedure done by Dr.Beverly. Per               New Patient Packet 09/14/1980:  KIDNEY STONE SURGERY     Comment:  Too many to count. Per New Patient Packet               09/14/1980-09/15/1995 09/15/1995: KIDNEY STONE SURGERY     Comment:  Too many to count. Per New Patient Packet 11/27/2021: LEFT HEART CATH AND CORONARY ANGIOGRAPHY; N/A     Comment:  Procedure: LEFT HEART CATH AND CORONARY ANGIOGRAPHY;                Surgeon: Marykay Lex, MD;  Location: [facility] CATH:                NSTEMI - 2 V CAD: 100% thrombotic prox-mid LCx (DES PCI),              ost OM1 65%; Ost LAD 75% - prox LAD 90%, prox-mid 99%               (DES PCI).  MIld diffuse RCA disease - calcicifed.  R-L               collaterals filling LCx. EF ~45-50% with lateral HK.               LVEDP 28 mmHg 09/14/2014: LITHOTRIPSY     Comment:  Per New Patient Packet 09/14/1996: LITHOTRIPSY     Comment:  No Stints Used. Per New Patient Packet 09/14/1997: LITHOTRIPSY     Comment:  No Stints used. Per New Patient Packet No date: PAIN PUMP IMPLANTATION No date: PAIN PUMP REMOVAL 1984: SHOULDER SURGERY; Right 09/14/2017: SIGMOIDOSCOPY     Comment:  Per New Patient Packet No date: SPINAL CORD STIMULATOR IMPLANT No date: TONSILLECTOMY 11/28/2021: TRANSTHORACIC ECHOCARDIOGRAM     Comment:  (in setting of non-STEMI): EF 50 to 55%.  Suspect               inferior and lateral hypokinesis.  Indeterminate               diastolic parameters.  Normal RV size and function.                Normal RAP.  Normal aortic and mitral valves with only               mild MR and AI.  Normal RVP and RAP. No date: UPPER GI ENDOSCOPY  BMI    Body Mass Index: 20.97 kg/m      Reproductive/Obstetrics negative OB ROS                             Anesthesia Physical Anesthesia Plan  ASA: 3  Anesthesia Plan: General   Post-op Pain Management:    Induction: Intravenous  PONV Risk Score and Plan: Ondansetron, Dexamethasone, Midazolam and Treatment may vary due to age or medical  condition  Airway Management Planned: Oral ETT  Additional Equipment:  Intra-op Plan:   Post-operative Plan: Extubation in OR  Informed Consent: I have reviewed the patients History and Physical, chart, labs and discussed the procedure including the risks, benefits and alternatives for the proposed anesthesia with the patient or authorized representative who has indicated his/her understanding and acceptance.     Dental Advisory Given  Plan Discussed with: CRNA and Surgeon  Anesthesia Plan Comments:        Anesthesia Quick Evaluation

## 2023-01-15 NOTE — Discharge Instructions (Addendum)
AMBULATORY SURGERY  DISCHARGE INSTRUCTIONS   The drugs that you were given will stay in your system until tomorrow so for the next 24 hours you should not:  Drive an automobile Make any legal decisions Drink any alcoholic beverage   You may resume regular meals tomorrow.  Today it is better to start with liquids and gradually work up to solid foods.  You may eat anything you prefer, but it is better to start with liquids, then soup and crackers, and gradually work up to solid foods.   Please notify your doctor immediately if you have any unusual bleeding, trouble breathing, redness and pain at the surgery site, drainage, fever, or pain not relieved by medication.    Additional Instructions:  Dr. Karna Christmas will call you with your results  Please contact your physician with any problems or Same Day Surgery at [phone], Monday through Friday 6 am to 4 pm, or [place] [facility] number at [phone].

## 2023-01-15 NOTE — Procedures (Signed)
ROBOTIC NAVIGATIONAL BRONCHOSCOPY PROCEDURE NOTE  FIBEROPTIC BRONCHOSCOPY WITH BRONCHOALVEOLAR LAVAGE AND THERAPEUTIC ASPIRATION OF TRACHEOBRONCHIAL TREE PROCEDURE NOTE  ENDOBRONCHIAL ULTRASOUND PROCEDURE NOTE    Flexible bronchoscopy was performed  by : Karna Christmas MD  assistance by : 1)Repiratory therapist  and 2)[employer] staff and 3) Anesthesia team and 4) Flouroscopy team and 5) [employer] supporting staff   Indication for the procedure was :  Pre-procedural H&P. The following assessment was performed on the day of the procedure prior to initiating sedation History:  Chest pain n Dyspnea y Hemoptysis n Cough y Fever n Other pertinent items n  Examination Vital signs -reviewed as per nursing documentation today Cardiac    Murmurs: n  Rubs : n  Gallop: n Lungs Wheezing: n Rales : n Rhonchi :y  Other pertinent findings: SOB/hypoxemia due to chronic lung disease   Pre-procedural assessment for Procedural Sedation included: Depth of sedation: As per anesthesia team  ASA Classification:  2 Mallampati airway assessment: 3    Medication list reviewed: y  The patient's interval history was taken and revealed: no new complaints The pre- procedure physical examination revealed: No new findings Refer to prior clinic note for details.  Informed Consent: Informed consent was obtained from:  patient after explanation of procedure and risks, benefits, as well as alternative procedures available.  Explanation of level of sedation and possible transfusion was also provided.    Procedural Preparation: Time out was performed and patient was identified by name and birthdate and procedure to be performed and side for sampling, if any, was specified. Pt was intubated by anesthesia.  The patient was appropriately draped.   Fiberoptic bronchoscopy with airway inspection and BAL Procedure findings:  Bronchoscope was inserted via ETT  without difficulty.  Posterior  oropharynx, epiglottis, arytenoids, false cords and vocal cords were not visualized as these were bypassed by endotracheal tube. The distal trachea was normal in circumference and appearance without mucosal, cartilaginous or branching abnormalities.  The main carina was mildly splayed . All right and left lobar airways were visualized to the Subsegmental level.  Sub- sub segmental carinae were identified in all the distal airways.   Secretions were visible in the following airways and appeared to be clear.  The mucosa was : friable at RUL  Airways were notable for:        exophytic lesions :n       extrinsic compression in the following distributions: n.       Friable mucosa: y       Teacher, music /pigmentation: n   MUCUS PLUGGING WAS NOTED BILATERALLY, THERAPEUTIC ASPIRATION OF TRACHEOBRONCHIAL TREE WAS PERFORMED  BRONCHOALVEOLAR LAVAGE WAS PERFORMED AT RIGHT MIDDLE LOBE  Post procedure Diagnosis:   MUCUS PLUGGING AND MUCOPURULENT DISCHARGE OF TRACHEOBRONCHIAL TREE     Robotic Navigational Bronchoscopy Procedure Findings:    Post appropriate planning and registration peripheral navigation was used to visualize target lesion.  Endobronchial microbrushings were obtain first for microbiology Transbronchial FNA at RUL x 2 was performed Surgical pathology of RUL lesion was performed x 3 - endobronchial biopsy     Post procedure diagnosis: likely granulomatous infection       Endobronchial ultrasound assisted hilar and mediastinal lymph node biopsies procedure findings: The fiberoptic bronchoscope was removed and the EBUS scope was introduced. Examination began to evaluate for pathologically enlarged lymph nodes starting on the left  side progressing to the right side.  All lymph node biopsies performed with 21 needle. Lymph node biopsies were sent in cytolite  for all stations.  Station 7 - 6mm not biopsied  Station 10 R - 5mm not biopsied Staition 10L - 6mm not  biopsied  Post procedure diagnosis:  normal appearance of in size and shape of all lymph node stations   Specimens obtained included: Microbiology brushes: x 1 at RUL ; sent for  AFB,                     Cytology brushes : none   Broncho-alveolar lavage site:RML   sent for AFB    gram stain and culture and aspergillus                          60ml volume infused 45ml volume returned with cellular and mucoid appearance  Endobronchial biopsy site: surgical forceps at RUL ; sent for cytology                                  Transbronchial biopsy site: FNA at RUL; sent for cytology     Fluoroscopy Used: yes ;        Pictorial documentation attached: none                  Transbronchial WANG needle aspiration site: yes  sent for: cytology at RUL                                    Endoscopic Linear Ultrasound Used: none   Pictorial documentation attached: none    With secondary sampling in none  additional lobes  Immediate sampling complications included:none  Epinephrine ZERO ml was used topically  The bronchoscopy was terminated due to completion of the planned procedure and the bronchoscope was removed.   Total dosage of Lidocaine was ZERO mg Total fluoroscopy time was AS PER RADIOLOGY  minutes  Supplemental oxygen was provided at AS PER ANESTHESIA lpm by nasal canula post operatively  Estimated Blood loss: EXPECTED < 5cc.  Complications included:  NONE IMMEDIATE   Preliminary CXR findings :  IN PROCESS  Disposition: HOME WITH WIFE  Follow up with Dr. Karna Christmas in 5 days for result discussion.     Vida Rigger MD  [facility] Division of Pulmonary & Critical Care Medicine

## 2023-01-15 NOTE — H&P (Signed)
PULMONOLOGY         Date: 01/15/2023,   MRN# [id number] Grant Young [birth year]     Admission                  Current   Referring provider: Dr Meredeth Ide    CHIEF COMPLAINT:   Multiple lung nodules concerning for possible bronchogenic carcinoma   HISTORY OF PRESENT ILLNESS   This is a pleasant 77 yo M with hx of chronic cough who also has PMH as below.  He was seen on outpatient with cough and lung nodules.  He had recent CT chest with lesions concerning for possible bronchogenic carcinoma.  He is here today for lung biopsy to have definitive diagnosis.  He has no new symptoms.  We discussed procedure and he is accompanied by wife.  He wishes to proceed with bronchoscopy to have lung biopsy.  Reviewed risks/complications and benefits with patient, risks include infection, pneumothorax/pneumomediastinum which may require chest tube placement as well as overnight/prolonged hospitalization and possible mechanical ventilation. Other risks include bleeding and very rarely death.  Patient understands risks and wishes to proceed.  Additional questions were answered, and patient is aware that post procedure patient will be going home with family and may experience cough with possible clots on expectoration as well as phlegm which may last few days as well as hoarseness of voice post intubation and mechanical ventilation.    PAST MEDICAL HISTORY   Past Medical History:  Diagnosis Date   Anemia    Aneurysm of ascending aorta (HCC)    a.) CT chest 08/18/2022: 4.1 cm; b.) CT chest 12/07/2022: 4.2 cm   Anxiety    a.) on BZO (diazepam) PRN   Aortic atherosclerosis (HCC)    Chronic back pain    Coronary artery disease    a.) s/p PCI with DES x 2 (pLCx and o-mLAD) 11/27/2021   Depression    Erectile dysfunction    a.) Bi-mix (papaverine + phentolamine) injections + exogenous testosterone injections   GERD (gastroesophageal reflux disease)    h/o Lyme disease    Headache     Hiatal hernia    History of bilateral cataract extraction 01/2021   History of gastritis    History of kidney stones    History of neuropathy    Hyperlipidemia LDL goal <70    a.) CAD with NSTEMI -->  intolerant of statins with statin myopathy and memory issues.;  Also intolerant of Repatha   Hypertension    Labile blood pressures but dizziness with blood pressures in the "normal range"; intolerant of most medications including ARB's, ACE inhibitor's, amlodipine most beta-blockers other than propranolol.   Insomnia    a.) takes malatonin PRN   Left lower lobe pulmonary nodule    a.) chest CT 12/07/2022: nodules x 2 posteromedial LLL;  8 x 11 mm and 8 x 10 mm   Long term current use of antithrombotics/antiplatelets    a.) DAPT (ASA + clopidogrel)   Long term current use of aromatase inhibitor    a,) anastrozole --> estridiol suppression secondary to exogenous testosterone use   Lumbar radicular pain    Myocardial infarction (HCC)    a.) MI x 2 - 1990 * 1999 - PTCA   NSTEMI (non-ST elevated myocardial infarction) (HCC) 11/27/2021   a.) LHC 11/28/2021: sequential 75% o-pLAD, 70% OM2, 80% p-mLAD, 99% mLAD, 100% pLCx --> PCI placing a 2.5 x 26 mm Onyx Frontier DES to [employer] and a  2.5 x 24 mm Onyx Frontier DES to the o-m LAD (covering 3 lesions)   Overactive bladder    Pneumonia    PONV (postoperative nausea and vomiting)    Recurrent herpes labialis    a.) has suppressive valacyclovir to use PRN   Skin cancer, basal cell    Spinal cord stimulator status    01/08/21 - not currently using.   Squamous cell skin cancer    Substance abuse (HCC)      SURGICAL HISTORY   Past Surgical History:  Procedure Laterality Date   BACK SURGERY     BASAL CELL CARCINOMA EXCISION     CATARACT EXTRACTION W/PHACO Left 01/14/2021   Procedure: CATARACT EXTRACTION PHACO AND INTRAOCULAR LENS PLACEMENT (IOC) LEFT VIVITY TORIC LENS 8.75 00:56.7;  Surgeon: Galen Manila, MD;  Location: [facility];   Service: Ophthalmology;  Laterality: Left;   CATARACT EXTRACTION W/PHACO Right 01/28/2021   Procedure: CATARACT EXTRACTION PHACO AND INTRAOCULAR LENS PLACEMENT (IOC) RIGHT VIVITY TORIC LENS;  Surgeon: Galen Manila, MD;  Location: [facility];  Service: Ophthalmology;  Laterality: Right;  6.54 00:46.4   CHOLECYSTECTOMY  2017   COLONOSCOPY  09/15/2015   Per New Patient Packet   COLONOSCOPY WITH PROPOFOL N/A 10/03/2020   Procedure: COLONOSCOPY WITH PROPOFOL;  Surgeon: Wyline Mood, MD;  Location: [facility];  Service: Gastroenterology;  Laterality: N/A;   CORONARY/GRAFT ACUTE MI REVASCULARIZATION N/A 11/27/2021   Procedure: Coronary/Graft Acute MI Revascularization;  Surgeon: Marykay Lex, MD;  Location: [facility] CATH: (NSTEMI) - > 100% prox-mid LCx (Onyx Frontier DES 2.5 x 26 -> 2.7 mm, Ost  OM1 65%.  Ost-mid LAD 3 lesions 75%, 90%, 99% => DES PCI Onyx Frontier DES 2.5 x 34 -> 2.8 mm   ESOPHAGOGASTRODUODENOSCOPY (EGD) WITH PROPOFOL N/A 10/03/2020   Procedure: ESOPHAGOGASTRODUODENOSCOPY (EGD) WITH PROPOFOL;  Surgeon: Wyline Mood, MD;  Location: [facility];  Service: Gastroenterology;  Laterality: N/A;   FOOT SURGERY Bilateral 03/18/2020   FOOT SURGERY  08/032021   GALLBLADDER SURGERY  09/15/2015   Gallbladder Removal. Procedure done by Dr.Beverly. Per New Patient Packet   KIDNEY STONE SURGERY  09/14/1980   Too many to count. Per New Patient Packet 09/14/1980-09/15/1995   KIDNEY STONE SURGERY  09/15/1995   Too many to count. Per New Patient Packet   LEFT HEART CATH AND CORONARY ANGIOGRAPHY N/A 11/27/2021   Procedure: LEFT HEART CATH AND CORONARY ANGIOGRAPHY;  Surgeon: Marykay Lex, MD;  Location: [facility] CATH:  NSTEMI - 2 V CAD: 100% thrombotic prox-mid LCx (DES PCI), ost OM1 65%; Ost LAD 75% - prox LAD 90%, prox-mid 99% (DES PCI).  MIld diffuse RCA disease - calcicifed.  R-L collaterals filling LCx. EF ~45-50% with lateral HK. LVEDP 28 mmHg   LITHOTRIPSY  09/14/2014   Per  New Patient Packet   LITHOTRIPSY  09/14/1996   No Stints Used. Per New Patient Packet   LITHOTRIPSY  09/14/1997   No Stints used. Per New Patient Packet   PAIN PUMP IMPLANTATION     PAIN PUMP REMOVAL     SHOULDER SURGERY Right 1984   SIGMOIDOSCOPY  09/14/2017   Per New Patient Packet   SPINAL CORD STIMULATOR IMPLANT     TONSILLECTOMY     TRANSTHORACIC ECHOCARDIOGRAM  11/28/2021   (in setting of non-STEMI): EF 50 to 55%.  Suspect inferior and lateral hypokinesis.  Indeterminate diastolic parameters.  Normal RV size and function.  Normal RAP.  Normal aortic and mitral valves with only mild MR and AI.  Normal RVP and RAP.   UPPER GI ENDOSCOPY       FAMILY HISTORY   Family History  Problem Relation Age of Onset   Heart disease Father    Heart failure Father    Hypertension Father    Stroke Father    Stroke Mother    Dementia Mother    Kidney Stones Daughter    Anxiety disorder Daughter    OCD Daughter      SOCIAL HISTORY   Social History   Tobacco Use   Smoking status: Never    Passive exposure: Never   Smokeless tobacco: Never  Vaping Use   Vaping Use: Never used  Substance Use Topics   Alcohol use: Yes    Comment: 1 Drink a Month, socially   Drug use: Not Currently     MEDICATIONS    Home Medication:    Current Medication:  Current Facility-Administered Medications:    butamben-tetracaine-benzocaine (CETACAINE) spray 1 spray, 1 spray, Topical, Once, Vida Rigger, MD   lactated ringers infusion, , Intravenous, Continuous, Foye Deer, MD, Last Rate: 10 mL/hr at 01/15/23 1232, Continued from Pre-op at 01/15/23 1232   lidocaine (PF) (XYLOCAINE) 1 % injection 30 mL, 30 mL, Infiltration, Once, Betsey Sossamon, MD   lidocaine (XYLOCAINE) 2 % jelly 1 Application, 1 Application, Topical, Once, Lyrika Souders, MD   phenylephrine (NEO-SYNEPHRINE) 0.25 % nasal spray 1 spray, 1 spray, Each Nare, Q6H PRN, Vida Rigger, MD    ALLERGIES   Ace  inhibitors, Fluoxetine, Metoclopramide, Nalbuphine, Other, Amoxicillin-pot clavulanate, Doxazosin, Duloxetine, Penicillins, Tamsulosin, Trazodone and nefazodone, Amlodipine, Cinoxacin, Ciprofloxacin, Fludrocortisone, Nebivolol, Olanzapine, Olmesartan, Pregabalin, Prostaglandins, Thyroid hormones, Zolpidem, Duloxetine hcl, Fluoxetine hcl, Nucynta [tapentadol], and Phenytoin     REVIEW OF SYSTEMS    Review of Systems:  Gen:  Denies  fever, sweats, chills weigh loss  HEENT: Denies blurred vision, double vision, ear pain, eye pain, hearing loss, nose bleeds, sore throat Cardiac:  No dizziness, chest pain or heaviness, chest tightness,edema Resp:   reports dyspnea chronically  Gi: Denies swallowing difficulty, stomach pain, nausea or vomiting, diarrhea, constipation, bowel incontinence Gu:  Denies bladder incontinence, burning urine Ext:   Denies Joint pain, stiffness or swelling Skin: Denies  skin rash, easy bruising or bleeding or hives Endoc:  Denies polyuria, polydipsia , polyphagia or weight change Psych:   Denies depression, insomnia or hallucinations   Other:  All other systems negative   VS: BP (!) 175/100   Pulse 74   Resp 16   Ht 5\' 9"  (1.753 m)   SpO2 96%   BMI 20.97 kg/m      PHYSICAL EXAM    GENERAL:NAD, no fevers, chills, no weakness no fatigue HEAD: Normocephalic, atraumatic.  EYES: Pupils equal, round, reactive to light. Extraocular muscles intact. No scleral icterus.  MOUTH: Moist mucosal membrane. Dentition intact. No abscess noted.  EAR, NOSE, THROAT: Clear without exudates. No external lesions.  NECK: Supple. No thyromegaly. No nodules. No JVD.  PULMONARY: decreased breath sounds with mild rhonchi worse at bases bilaterally.  CARDIOVASCULAR: S1 and S2. Regular rate and rhythm. No murmurs, rubs, or gallops. No edema. Pedal pulses 2+ bilaterally.  GASTROINTESTINAL: Soft, nontender, nondistended. No masses. Positive bowel sounds. No hepatosplenomegaly.   MUSCULOSKELETAL: No swelling, clubbing, or edema. Range of motion full in all extremities.  NEUROLOGIC: Cranial nerves II through XII are intact. No gross focal neurological deficits. Sensation intact. Reflexes intact.  SKIN: No ulceration, lesions, rashes, or cyanosis. Skin warm and dry. Turgor intact.  PSYCHIATRIC: Mood, affect within normal limits. The patient is awake, alert and oriented x 3. Insight, judgment intact.       IMAGING  Reviewed most recent CT chest with multiple bilateral lung nodules   ASSESSMENT/PLAN   Multiple lung nodules The posteromedial left lower lobe nodule measured previously at 8 x 11 mm is now 6 x 5 mm on 101/4. The second nodule measured previously 8 x 10 mm is 7 x 5 mm today on image 123/4.   There is a new 12 x 6 mm posterior right middle lobe nodule on image 101/4. -possible hilar lymphadenopathy -plan for robotic bronchoscopy and ebus  -    -Reviewed risks/complications and benefits with patient, risks include infection, pneumothorax/pneumomediastinum which may require chest tube placement as well as overnight/prolonged hospitalization and possible mechanical ventilation. Other risks include bleeding and very rarely death.  Patient understands risks and wishes to proceed.  Additional questions were answered, and patient is aware that post procedure patient will be going home with family and may experience cough with possible clots on expectoration as well as phlegm which may last few days as well as hoarseness of voice post intubation and mechanical ventilation.             Thank you for allowing me to participate in the care of this patient.   Patient/Family are satisfied with care plan and all questions have been answered.    Provider disclosure: Patient with at least one acute or chronic illness or injury that poses a threat to life or bodily function and is being managed actively during this encounter.  All of the below services have  been performed independently by signing provider:  review of prior documentation from internal and or external health records.  Review of previous and current lab results.  Interview and comprehensive assessment during patient visit today. Review of current and previous chest radiographs/CT scans. Discussion of management and test interpretation with health care team and patient/family.   This document was prepared using Dragon voice recognition software and may include unintentional dictation errors.     Vida Rigger, M.D.  Division of Pulmonary & Critical Care Medicine

## 2023-01-15 NOTE — Anesthesia Procedure Notes (Signed)
Procedure Name: Intubation Date/Time: 01/15/2023 12:56 PM  Performed by: Monico Hoar, CRNAPre-anesthesia Checklist: Patient identified, Patient being monitored, Timeout performed, Emergency Drugs available and Suction available Patient Re-evaluated:Patient Re-evaluated prior to induction Oxygen Delivery Method: Circle system utilized Preoxygenation: Pre-oxygenation with 100% oxygen Induction Type: IV induction Ventilation: Mask ventilation without difficulty Laryngoscope Size: Mac and 4 Grade View: Grade I Tube type: Oral Tube size: 9.0 mm Number of attempts: 1 Airway Equipment and Method: Stylet Placement Confirmation: ETT inserted through vocal cords under direct vision, positive ETCO2 and breath sounds checked- equal and bilateral Secured at: 21 cm Tube secured with: Tape Dental Injury: Teeth and Oropharynx as per pre-operative assessment

## 2023-01-16 LAB — CULTURE, RESPIRATORY W GRAM STAIN: Gram Stain: NONE SEEN

## 2023-01-17 ENCOUNTER — Encounter: Payer: Self-pay | Admitting: Internal Medicine

## 2023-01-17 DIAGNOSIS — R42 Dizziness and giddiness: Secondary | ICD-10-CM

## 2023-01-17 LAB — ACID FAST SMEAR (AFB, MYCOBACTERIA)
Acid Fast Smear: NEGATIVE
Acid Fast Smear: POSITIVE — AB

## 2023-01-17 LAB — CULTURE, RESPIRATORY W GRAM STAIN: Culture: NO GROWTH

## 2023-01-18 ENCOUNTER — Encounter: Payer: Self-pay | Admitting: Pulmonary Disease

## 2023-01-18 LAB — FUNGUS CULTURE, BLOOD
Culture: NO GROWTH
Special Requests: ADEQUATE

## 2023-01-18 LAB — CULTURE, RESPIRATORY W GRAM STAIN

## 2023-01-18 MED ORDER — DIAZEPAM 5 MG PO TABS: ORAL_TABLET | ORAL | 0 refills | Status: DC

## 2023-01-19 LAB — CYTOLOGY - NON PAP

## 2023-01-19 LAB — CULTURE, RESPIRATORY W GRAM STAIN

## 2023-01-19 LAB — ASPERGILLUS ANTIGEN, BAL/SERUM
Aspergillus Ag, BAL/Serum: 0.03 Index (ref 0.00–0.49)
Aspergillus Ag, BAL/Serum: 0.05 Index (ref 0.00–0.49)

## 2023-01-19 LAB — SURGICAL PATHOLOGY

## 2023-01-22 NOTE — Anesthesia Postprocedure Evaluation (Signed)
Anesthesia Post Note  Patient: Grant Young  Procedure(s) Performed: VIDEO BRONCHOSCOPY WITH ENDOBRONCHIAL ULTRASOUND ROBOTIC ASSISTED NAVIGATIONAL BRONCHOSCOPY  Patient location during evaluation: PACU Anesthesia Type: General Level of consciousness: awake and oriented Pain management: satisfactory to patient Respiratory status: spontaneous breathing and respiratory function stable Cardiovascular status: blood pressure returned to baseline Anesthetic complications: no   No notable events documented.   Last Vitals:  Vitals:   01/15/23 1502 01/15/23 1600  BP: (!) 167/82 (!) 145/76  Pulse: 71 74  Resp: 16 16  Temp: 36.6 C   SpO2: 99% 100%    Last Pain:  Vitals:   01/15/23 1600  TempSrc:   PainSc: 4                  VAN STAVEREN,Haadiya Frogge

## 2023-01-25 LAB — ACID FAST CULTURE WITH REFLEXED SENSITIVITIES (MYCOBACTERIA)

## 2023-01-27 LAB — ACID FAST ID BY PCR AND SUSCEPTIBILITIES

## 2023-02-02 ENCOUNTER — Encounter: Payer: Self-pay | Admitting: Student in an Organized Health Care Education/Training Program

## 2023-02-02 ENCOUNTER — Ambulatory Visit
Payer: Worker's Compensation | Attending: Student in an Organized Health Care Education/Training Program | Admitting: Student in an Organized Health Care Education/Training Program

## 2023-02-02 VITALS — BP 149/86 | HR 74 | Temp 97.9°F | Resp 14 | Ht 69.0 in | Wt 139.0 lb

## 2023-02-02 DIAGNOSIS — M47816 Spondylosis without myelopathy or radiculopathy, lumbar region: Secondary | ICD-10-CM

## 2023-02-02 DIAGNOSIS — G894 Chronic pain syndrome: Secondary | ICD-10-CM

## 2023-02-02 NOTE — Progress Notes (Signed)
PROVIDER NOTE: Information contained herein reflects review and annotations entered in association with encounter. Interpretation of such information and data should be left to medically-trained personnel. Information provided to patient can be located elsewhere in the medical record under "Patient Instructions". Document created using STT-dictation technology, any transcriptional errors that may result from process are unintentional.    Patient: Grant Young  Service Category: E/M  Provider: Edward Jolly, MD  DOB: 02/21/1946  DOS: 02/02/2023  Referring Provider: Lorre Munroe, NP  MRN: [id number]  Specialty: Interventional Pain Management  PCP: Lorre Munroe, NP  Type: Established Patient  Setting: Ambulatory outpatient    Location: Office  Delivery: Face-to-face     HPI  [name], a 77 y.o. year old male, is here today because of his Lumbar facet arthropathy [M47.816]. [name] primary complain today is Back Pain (lower)  Pertinent problems: [name] has Lumbar facet arthropathy and Chronic pain syndrome on their pertinent problem list. Pain Assessment: Severity of Chronic pain is reported as a 7 /10. Location: Back Lower/denies. Onset: More than a month ago. Quality: Aching. Timing: Constant. Modifying factor(s): ice, heat, stretching. Vitals:  height is 5\' 9"  (1.753 m) and weight is 139 lb (63 kg). His temporal temperature is 97.9 F (36.6 C). His blood pressure is 149/86 (abnormal) and his pulse is 74. His respiration is 14 and oxygen saturation is 100%.  BMI: Estimated body mass index is 20.53 kg/m as calculated from the following:   Height as of this encounter: 5\' 9"  (1.753 m).   Weight as of this encounter: 139 lb (63 kg). Last encounter: 12/29/2022. Last procedure: 10/28/2022.  Reason for encounter: follow-up evaluation.   Patient has persistent and severe axial low back pain related to lumbar facet arthropathy and spondylosis.  He has had diagnostic lumbar facet medial  branch nerve blocks that did provide him with short-term pain relief.  We have discussed [employer] peripheral nerve stimulation of the lumbar medial branch nerves.  He does have a spinal cord stimulator in place.  He has a history of L5-S1 fusion.  He has connected with the [employer] representative and has had his questions answered.  Pharmacotherapy Assessment  Analgesic: Hydrocodone IR 10 mg BID-TID prn    Monitoring: [place] PMP: PDMP not reviewed this encounter.       Pharmacotherapy: No side-effects or adverse reactions reported. Compliance: No problems identified. Effectiveness: Clinically acceptable.  No notes on file  No results found for: "CBDTHCR" No results found for: "D8THCCBX" No results found for: "D9THCCBX"  UDS:  Summary  Date Value Ref Range Status  09/17/2020 Note  Final    Comment:    ==================================================================== Compliance Drug Analysis, Ur ==================================================================== Specimen Alert Note: Urinary creatinine is low; ability to detect some drugs may be compromised. Interpret results with caution. (Creatinine) ==================================================================== Test                             Result       Flag       Units  Drug Present and Declared for Prescription Verification   Lorazepam                      1107         EXPECTED   ng/mg creat    Source of lorazepam is a scheduled prescription medication.    Butalbital  PRESENT      EXPECTED   Citalopram                     PRESENT      EXPECTED   Desmethylcitalopram            PRESENT      EXPECTED    Desmethylcitalopram is an expected metabolite of citalopram or the    enantiomeric form, escitalopram.    Acetaminophen                  PRESENT      EXPECTED   Propranolol                    PRESENT      EXPECTED  Drug Present not Declared for Prescription Verification   Oxazepam                        300          UNEXPECTED ng/mg creat   Temazepam                      357          UNEXPECTED ng/mg creat    Oxazepam and temazepam are expected metabolites of diazepam.    Oxazepam is also an expected metabolite of other benzodiazepine    drugs, including chlordiazepoxide, prazepam, clorazepate, halazepam,    and temazepam.  Oxazepam and temazepam are available as scheduled    prescription medications.  Drug Absent but Declared for Prescription Verification   Hydrocodone                    Not Detected UNEXPECTED ng/mg creat   Tizanidine                     Not Detected UNEXPECTED    Tizanidine, as indicated in the declared medication list, is not    always detected even when used as directed.    Amitriptyline                  Not Detected UNEXPECTED   Prochlorperazine               Not Detected UNEXPECTED   Salicylate                     Not Detected UNEXPECTED    Aspirin, as indicated in the declared medication list, is not always    detected even when used as directed.  ==================================================================== Test                      Result    Flag   Units      Ref Range   Creatinine              14        LL     mg/dL      >=30 ==================================================================== Declared Medications:  The flagging and interpretation on this report are based on the  following declared medications.  Unexpected results may arise from  inaccuracies in the declared medications.   **Note: The testing scope of this panel includes these medications:   Amitriptyline (Elavil)  Butalbital (Fioricet)  Butalbital (Fiorinal)  Escitalopram (Lexapro)  Hydrocodone (Norco)  Lorazepam (Ativan)  Prochlorperazine (Compazine)  Propranolol (Inderal)   **Note: The testing scope of this panel does not include small  to  moderate amounts of these reported medications:   Acetaminophen (Fioricet)  Acetaminophen (Norco)  Aspirin (Fiorinal)   Tizanidine (Zanaflex)   **Note: The testing scope of this panel does not include the  following reported medications:   Anastrozole (Arimidex)  Caffeine (Fioricet)  Caffeine (Fiorinal)  Chlorthalidone (Hygroton)  Cholecalciferol  Cholestyramine (Questran)  Dicyclomine (Bentyl)  Ezetimibe (Zetia)  Hydrocortisone  Melatonin  Ondansetron (Zofran)  Potassium (Klor-Con)  Rabeprazole (Aciphex)  Sucralfate (Carafate)  Valacyclovir (Valtrex)  Vitamin B12 ==================================================================== For clinical consultation, please call [phone]. ====================================================================       ROS  Constitutional: Denies any fever or chills Gastrointestinal: No reported hemesis, hematochezia, vomiting, or acute GI distress Musculoskeletal:  +axial low back pain Neurological: No reported episodes of acute onset apraxia, aphasia, dysarthria, agnosia, amnesia, paralysis, loss of coordination, or loss of consciousness  Medication Review  Cholecalciferol, DHEA, Digestive Aids Mixture, Loperamide HCl, Magnesium Bisglycinate, NONFORMULARY OR COMPOUNDED ITEM, Nutritional Supplements, Potassium, Probiotic Product, RABEprazole, Vitamin B-12, anastrozole, butalbital-acetaminophen-caffeine, cetirizine, clopidogrel, diazepam, dicyclomine, ezetimibe, ferrous sulfate, ipratropium, melatonin, nortriptyline, polyethylene glycol, prochlorperazine, propranolol ER, pyridostigmine, sucralfate, testosterone cypionate, valACYclovir, and venlafaxine XR  History Review  Allergy: Mr. Yearta is allergic to ace inhibitors, fluoxetine, metoclopramide, nalbuphine, other, amoxicillin-pot clavulanate, doxazosin, duloxetine, penicillins, tamsulosin, trazodone and nefazodone, amlodipine, cinoxacin, ciprofloxacin, fludrocortisone, nebivolol, olanzapine, olmesartan, pregabalin, prostaglandins, thyroid hormones, zolpidem, duloxetine hcl, fluoxetine hcl, nucynta  [tapentadol], and phenytoin. Drug: Mr. Leben  reports that he does not currently use drugs. Alcohol:  reports current alcohol use. Tobacco:  reports that he has never smoked. He has never been exposed to tobacco smoke. He has never used smokeless tobacco. Social: Mr. Goldfine  reports that he has never smoked. He has never been exposed to tobacco smoke. He has never used smokeless tobacco. He reports current alcohol use. He reports that he does not currently use drugs. Medical:  has a past medical history of Anemia, Aneurysm of ascending aorta (HCC), Anxiety, Aortic atherosclerosis (HCC), Chronic back pain, Coronary artery disease, Depression, Erectile dysfunction, GERD (gastroesophageal reflux disease), h/o Lyme disease, Headache, Hiatal hernia, History of bilateral cataract extraction (01/2021), History of gastritis, History of kidney stones, History of neuropathy, Hyperlipidemia LDL goal <70, Hypertension, Insomnia, Left lower lobe pulmonary nodule, Long term current use of antithrombotics/antiplatelets, Long term current use of aromatase inhibitor, Lumbar radicular pain, Myocardial infarction [facility]), NSTEMI (non-ST elevated myocardial infarction) (HCC) (11/27/2021), Overactive bladder, Pneumonia, PONV (postoperative nausea and vomiting), Recurrent herpes labialis, Skin cancer, basal cell, Spinal cord stimulator status, Squamous cell skin cancer, and Substance abuse (HCC). Surgical: Mr. Kennemer  has a past surgical history that includes Kidney stone surgery (09/14/1980); Kidney stone surgery (09/15/1995); Lithotripsy (09/14/2014); Lithotripsy (09/14/1996); Lithotripsy (09/14/1997); Gallbladder surgery (09/15/2015); Sigmoidoscopy (09/14/2017); Colonoscopy (09/15/2015); Cholecystectomy (2017); Shoulder surgery (Right, 1984); Back surgery; Spinal cord stimulator implant; Pain pump implantation; Pain pump removal; Tonsillectomy; Foot surgery (Bilateral, 03/18/2020); Foot surgery (08/032021); Upper gi endoscopy;  Colonoscopy with propofol (N/A, 10/03/2020); Esophagogastroduodenoscopy (egd) with propofol (N/A, 10/03/2020); Cataract extraction w/PHACO (Left, 01/14/2021); Cataract extraction w/PHACO (Right, 01/28/2021); Coronary/Graft Acute MI Revascularization (N/A, 11/27/2021); LEFT HEART CATH AND CORONARY ANGIOGRAPHY (N/A, 11/27/2021); Excision basal cell carcinoma; transthoracic echocardiogram (11/28/2021); and Video bronchoscopy with endobronchial ultrasound (N/A, 01/15/2023). Family: family history includes Anxiety disorder in his daughter; Dementia in his mother; Heart disease in his father; Heart failure in his father; Hypertension in his father; Kidney Stones in his daughter; OCD in his daughter; Stroke in his father and mother.  Laboratory Chemistry Profile   Renal Lab Results  Component  Value Date   BUN 13 01/13/2023   CREATININE 0.79 01/13/2023   BCR SEE NOTE: 09/18/2022   GFRAA 101 05/20/2020   GFRNONAA >60 01/13/2023    Hepatic Lab Results  Component Value Date   AST 18 09/24/2022   ALT 14 09/24/2022   ALBUMIN 3.8 09/24/2022   ALKPHOS 107 09/24/2022   AMYLASE 32 09/18/2022   LIPASE 14 09/18/2022    Electrolytes Lab Results  Component Value Date   NA 143 01/13/2023   K 3.4 (L) 01/13/2023   CL 104 01/13/2023   CALCIUM 9.3 01/13/2023   MG 2.0 08/20/2022   PHOS 3.6 11/30/2021    Bone Lab Results  Component Value Date   TESTOSTERONE 33.2 05/20/2020    Inflammation (CRP: Acute Phase) (ESR: Chronic Phase) Lab Results  Component Value Date   CRP 22.9 (H) 08/18/2022   LATICACIDVEN 1.4 08/18/2022         Note: Above Lab results reviewed.  Recent Imaging Review  DG Chest Port 1 View CLINICAL DATA:  Status post bronchoscopy.  EXAM: PORTABLE CHEST 1 VIEW  COMPARISON:  August 18, 2022.  FINDINGS: Stable cardiomediastinal silhouette. Right upper lobe atelectasis or infiltrate is noted. No definite pneumothorax or pleural effusion is noted. Bony thorax is  unremarkable.  IMPRESSION: Right upper lobe atelectasis or infiltrate or scarring is noted. No pneumothorax is noted status post bronchoscopy.  Electronically Signed   By: Lupita Raider M.D.   On: 01/15/2023 14:39 DG C-Arm 1-60 Min-No Report Fluoroscopy was utilized by the requesting physician.  No radiographic  interpretation.  CT SUPER D CHEST WO MONARCH PILOT CLINICAL DATA:  Pre-procedure planning.  Lung nodule.  EXAM: CT CHEST WITHOUT CONTRAST  TECHNIQUE: Multidetector CT imaging of the chest was performed using thin slice collimation for electromagnetic bronchoscopy planning purposes, without intravenous contrast.  RADIATION DOSE REDUCTION: This exam was performed according to the departmental dose-optimization program which includes automated exposure control, adjustment of the mA and/or kV according to patient size and/or use of iterative reconstruction technique.  COMPARISON:  12/07/2022  FINDINGS: Cardiovascular: The heart size is normal. No substantial pericardial effusion. Coronary artery calcification is evident. Ascending thoracic aorta is stable at 4.2 cm diameter.  Mediastinum/Nodes: No mediastinal lymphadenopathy. No evidence for gross hilar lymphadenopathy although assessment is limited by the lack of intravenous contrast on the current study. The esophagus has normal imaging features. There is no axillary lymphadenopathy.  Lungs/Pleura: As noted previously, there are scattered areas of architectural distortion/scarring with bronchiectasis and clustered nodularity. While generally similar, some areas of nodularity appear improved while others have progressed. 2 discrete nodular areas of concern on the previous study have improved. The posteromedial left lower lobe nodule measured previously at 8 x 11 mm is now 6 x 5 mm on 101/4. The second nodule measured previously 8 x 10 mm is 7 x 5 mm today on image 123/4.  There is a new 12 x 6 mm posterior  right middle lobe nodule on image 101/4.  No pleural effusion.  Upper Abdomen: Tiny nonobstructing stone seen upper pole left kidney.  Musculoskeletal: No worrisome lytic or sclerotic osseous abnormality. Spinal stimulator device evident in the thoracic spine.  IMPRESSION: 1. Scattered areas of architectural distortion/scarring with bronchiectasis and clustered nodularity in both lungs. While generally similar, some areas of nodularity appear improved while others have progressed. The 2 discrete nodular areas of concern on the previous study have improved in the interval. 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 2. New 12 x 6 mm posterior right middle lobe nodule, likely infectious/inflammatory, but follow-up imaging recommended. Consider chest CT in 3 months to assess for resolution. 3. Stable 4.2 cm ascending thoracic aortic aneurysm. Recommend annual imaging followup by CTA or MRA. This recommendation follows 2010 ACCF/AHA/AATS/ACR/ASA/SCA/SCAI/SIR/STS/SVM Guidelines for the Diagnosis and Management of Patients with Thoracic Aortic Disease. Circulation. 2010; 121: Z610-R604. Aortic aneurysm NOS (ICD10-I71.9) . 4. Tiny nonobstructing stone upper pole left kidney.  Electronically Signed   By: Kennith Center M.D.   On: 01/15/2023 08:24 Note: Reviewed        Physical Exam  General appearance: Well nourished, well developed, and well hydrated. In no apparent acute distress Mental status: Alert, oriented x 3 (person, place, & time)       Respiratory: No evidence of acute respiratory distress Eyes: PERLA Vitals: BP (!) 149/86   Pulse 74   Temp 97.9 F (36.6 C) (Temporal)   Resp 14   Ht 5\' 9"  (1.753 m)   Wt 139 lb (63 kg)   SpO2 100%   BMI 20.53 kg/m  BMI: Estimated body mass index is 20.53 kg/m as calculated from the following:   Height as of this encounter: 5\' 9"  (1.753 m).   Weight as of this encounter: 139 lb (63  kg). Ideal: Ideal body weight: 70.7 kg (155 lb 13.8 oz)  Lumbar Spine Area Exam  Skin & Axial Inspection: Well healed scar from previous spine surgery detected IPG present from SCS Alignment: Scoliosis detected Functional ROM: Pain restricted ROM       Stability: No instability detected Muscle Tone/Strength: Functionally intact. No obvious neuro-muscular anomalies detected. Sensory (Neurological): Facetogenic pain pattern, pain with facet loading   Hyperextension/rotation test: (+) bilaterally for facet joint pain. Lumbar quadrant test (Kemp's test): (+) bilaterally for facet joint pain.   Gait & Posture Assessment  Ambulation: Unassisted Gait: Relatively normal for age and body habitus Posture: WNL    Lower Extremity Exam      Side: Right lower extremity   Side: Left lower extremity  Stability: No instability observed           Stability: No instability observed          Skin & Extremity Inspection: Skin color, temperature, and hair growth are WNL. No peripheral edema or cyanosis. No masses, redness, swelling, asymmetry, or associated skin lesions. No contractures.   Skin & Extremity Inspection: Skin color, temperature, and hair growth are WNL. No peripheral edema or cyanosis. No masses, redness, swelling, asymmetry, or associated skin lesions. No contractures.  Functional ROM: Unrestricted ROM                   Functional ROM: Unrestricted ROM                  Muscle Tone/Strength: Functionally intact. No obvious neuro-muscular anomalies detected.   Muscle Tone/Strength: Functionally intact. No obvious neuro-muscular anomalies detected.  Sensory (Neurological): Unimpaired         Sensory (Neurological): Unimpaired        DTR: Patellar: deferred today Achilles: deferred today Plantar: deferred today   DTR: Patellar: deferred today Achilles: deferred today Plantar: deferred today  Palpation: No palpable anomalies   Palpation: No palpable anomalies     Assessment   Diagnosis  Status  1. Lumbar facet arthropathy   2. Lumbar spondylosis   3. Chronic pain syndrome    Persistent Persistent Persistent   Updated Problems: No problems updated.  Plan of Care  1. Lumbar facet arthropathy - Implantable Peripheral Nerve Stimulator; Future - Implantable Peripheral Nerve Stimulator; Future  2. Lumbar spondylosis - Implantable Peripheral Nerve Stimulator; Future - Implantable Peripheral Nerve Stimulator; Future  3. Chronic pain syndrome - Implantable Peripheral Nerve Stimulator; Future - Implantable Peripheral Nerve Stimulator; Future    Orders:  Orders Placed This Encounter  Procedures   Implantable Peripheral Nerve Stimulator    Standing Status:   Future    Standing Expiration Date:   05/05/2023    Scheduling Instructions:     Procedure: Temporary implantable peripheral nerve stimulator     Side:  Left     Nerve site: L4     Sedation: without     Timeframe: ASAA    Order Specific Question:   Location of Procedure    Answer:   [facility]   Implantable Peripheral Nerve Stimulator    Standing Status:   Future    Standing Expiration Date:   05/05/2023    Scheduling Instructions:     Procedure: Temporary implantable peripheral nerve stimulator     Side:  RIGHT     Nerve site: L4     Sedation: without     Timeframe: 2 weeks after LEFT    Order Specific Question:   Location of Procedure    Answer:   [facility]   Follow-up plan:   Return in about 4 weeks (around 03/02/2023) for Bilateral  L4 PNS, in clinic NS (stop Plavix 7 days prior, ok to take ASA).      Status post Botox No. 1 on 01/01/2020 for migraine, lumbar TPI as well. SPG block 03/13/20. Bilateral GONB 04/01/20. 11/13/20 B/L L3,4,5 Facet blocks helpful repeat prn             Recent Visits Date Type Provider Dept  12/29/22 Office Visit Edward Jolly, MD [facility]  11/19/22 Office Visit Edward Jolly, MD [facility]  Showing recent visits  within past 90 days and meeting all other requirements Today's Visits Date Type Provider Dept  02/02/23 Office Visit Edward Jolly, MD [facility]  Showing today's visits and meeting all other requirements Future Appointments No visits were found meeting these conditions. Showing future appointments within next 90 days and meeting all other requirements  I discussed the assessment and treatment plan with the patient. The patient was provided an opportunity to ask questions and all were answered. The patient agreed with the plan and demonstrated an understanding of the instructions.  Patient advised to call back or seek an in-person evaluation if the symptoms or condition worsens.  Duration of encounter: .  Total time on encounter, as per AMA guidelines included both the face-to-face and non-face-to-face time personally spent by the physician and/or other qualified health care professional(s) on the day of the encounter (includes time in activities that require the physician or other qualified health care professional and does not include time in activities normally performed by clinical staff). Physician's time may include the following activities when performed: Preparing to see the patient (e.g., pre-charting review of records, searching for previously ordered imaging, lab work, and nerve conduction tests) Review of prior analgesic pharmacotherapies. Reviewing PMP Interpreting ordered tests (e.g., lab work, imaging, nerve conduction tests) Performing post-procedure evaluations, including interpretation of diagnostic procedures Obtaining and/or reviewing separately obtained history Performing a medically appropriate examination and/or evaluation Counseling and educating the patient/family/caregiver Ordering medications, tests, or procedures Referring and communicating with other health care  professionals (when not separately reported) Documenting clinical information in  the electronic or other health record Independently interpreting results (not separately reported) and communicating results to the patient/ family/caregiver Care coordination (not separately reported)  Note by: Edward Jolly, MD Date: 02/02/2023; Time: 3:18 PM

## 2023-02-02 NOTE — Patient Instructions (Addendum)
Stop Plavix 7 days before procedure You may restart Plavix 2 days after the procedure. Wash your skin with the soap solution the night before.

## 2023-02-04 ENCOUNTER — Other Ambulatory Visit
Admission: RE | Admit: 2023-02-04 | Discharge: 2023-02-04 | Disposition: A | Discharge status: Home / Self Care | Payer: Medicare Other | Attending: Cardiology | Admitting: Cardiology

## 2023-02-04 ENCOUNTER — Telehealth: Payer: Self-pay

## 2023-02-04 DIAGNOSIS — G720 Drug-induced myopathy: Secondary | ICD-10-CM | POA: Diagnosis present

## 2023-02-04 DIAGNOSIS — E785 Hyperlipidemia, unspecified: Secondary | ICD-10-CM | POA: Insufficient documentation

## 2023-02-04 DIAGNOSIS — R0989 Other specified symptoms and signs involving the circulatory and respiratory systems: Secondary | ICD-10-CM | POA: Insufficient documentation

## 2023-02-04 DIAGNOSIS — T466X5A Adverse effect of antihyperlipidemic and antiarteriosclerotic drugs, initial encounter: Secondary | ICD-10-CM | POA: Diagnosis present

## 2023-02-04 DIAGNOSIS — I2510 Atherosclerotic heart disease of native coronary artery without angina pectoris: Secondary | ICD-10-CM | POA: Diagnosis present

## 2023-02-04 DIAGNOSIS — Z9861 Coronary angioplasty status: Secondary | ICD-10-CM | POA: Insufficient documentation

## 2023-02-04 LAB — COMPREHENSIVE METABOLIC PANEL
ALT: 19 U/L (ref 0–44)
AST: 19 U/L (ref 15–41)
Albumin: 3.7 g/dL (ref 3.5–5.0)
Alkaline Phosphatase: 111 U/L (ref 38–126)
Anion gap: 8 (ref 5–15)
BUN: 14 mg/dL (ref 8–23)
CO2: 29 mmol/L (ref 22–32)
Calcium: 9 mg/dL (ref 8.9–10.3)
Chloride: 105 mmol/L (ref 98–111)
Creatinine, Ser: 0.84 mg/dL (ref 0.61–1.24)
GFR, Estimated: >60 mL/min (ref >60)
Glucose, Bld: 95 mg/dL (ref 70–99)
Potassium: 3.8 mmol/L (ref 3.5–5.1)
Sodium: 142 mmol/L (ref 135–145)
Total Bilirubin: 0.7 mg/dL (ref 0.3–1.2)
Total Protein: 6.5 g/dL (ref 6.5–8.1)

## 2023-02-04 LAB — LIPID PANEL
Cholesterol: 169 mg/dL (ref 0–200)
HDL: 44 mg/dL (ref >40)
LDL Cholesterol: 104 mg/dL — ABNORMAL HIGH (ref 0–99)
Total CHOL/HDL Ratio: 3.8 RATIO
Triglycerides: 106 mg/dL (ref <150)
VLDL: 21 mg/dL (ref 0–40)

## 2023-02-04 NOTE — Telephone Encounter (Signed)
Insurance Treatment Denial Note  Date order was entered:  Order entered by: Edward Jolly, MD Requested treatment: PNS Reason for denial: Treatment not covered by plan. Recommended for approval:  Worker comp denied   Workers comp denied [employer] for the [employer]. They state he has a SCS and it should be able to be adjusted to treat peripheral pain. They said he had been treating for axial type pain and had successful medial branch nerve blocks and planned for RFA. There was no evidence of failure of neuropathic pain meds. No indication or necessity for this treatment now as the patient is responding to treatment for axial pain and should be tried on neuropathic pain meds.

## 2023-02-05 LAB — MAC SUSCEPTIBILITY BROTH
Ciprofloxacin: >8
Clarithromycin: 4
Doxycycline: >8
Linezolid: >32
Minocycline: >8
Moxifloxacin: 4
Rifabutin: 0.25
Rifampin: >4
Streptomycin: 32

## 2023-02-05 LAB — ACID FAST ID BY PCR AND SUSCEPTIBILITIES
M Tuberculosis Complex: NEGATIVE
M avium complex: POSITIVE — AB

## 2023-02-05 LAB — ACID FAST CULTURE WITH REFLEXED SENSITIVITIES (MYCOBACTERIA): Acid Fast Culture: POSITIVE — AB

## 2023-02-06 LAB — LIPOPROTEIN A (LPA): Lipoprotein (a): 28.1 nmol/L (ref <75.0)

## 2023-02-09 ENCOUNTER — Telehealth: Payer: Self-pay | Admitting: Pharmacist

## 2023-02-09 NOTE — Telephone Encounter (Signed)
Call to schedule appointment for lipid clinic. N/A LVM

## 2023-02-17 ENCOUNTER — Telehealth: Payer: Self-pay

## 2023-02-17 DIAGNOSIS — R42 Dizziness and giddiness: Secondary | ICD-10-CM

## 2023-02-17 NOTE — Telephone Encounter (Unsigned)
Copied from CRM [id number]. Topic: Referral - Request for Referral >> Feb 17, 2023  3:12 PM Alfred Levins wrote: [facility] Dept.Marland KitchenMarland KitchenMarland KitchenMadelyn Flavors   [phone]   fax: [phone]  Requesting a faxed order for Physical Therapy for Vertigo .Marland KitchenMarland Kitchen

## 2023-02-18 NOTE — Addendum Note (Signed)
Addended by: Lorre Munroe on: 02/18/2023 08:05 AM   Modules accepted: Orders

## 2023-02-18 NOTE — Telephone Encounter (Signed)
Referral placed.

## 2023-02-20 ENCOUNTER — Encounter: Payer: Self-pay | Admitting: Student in an Organized Health Care Education/Training Program

## 2023-02-23 ENCOUNTER — Ambulatory Visit (INDEPENDENT_AMBULATORY_CARE_PROVIDER_SITE_OTHER): Payer: Medicare Other | Admitting: Internal Medicine

## 2023-02-23 ENCOUNTER — Encounter: Payer: Self-pay | Admitting: Internal Medicine

## 2023-02-23 VITALS — BP 134/88 | HR 73 | Temp 96.9°F | Wt 139.0 lb

## 2023-02-23 DIAGNOSIS — J0140 Acute pansinusitis, unspecified: Secondary | ICD-10-CM

## 2023-02-23 MED ORDER — DIAZEPAM 5 MG PO TABS: ORAL_TABLET | ORAL | 0 refills | Status: DC

## 2023-02-23 MED ORDER — PREDNISONE 20 MG PO TABS: 20.0000 mg | ORAL_TABLET | Freq: Every day | ORAL | 0 refills | Status: DC

## 2023-02-23 MED ORDER — AZITHROMYCIN 250 MG PO TABS: ORAL_TABLET | ORAL | 0 refills | Status: DC

## 2023-02-23 NOTE — Addendum Note (Signed)
Addended by: Lorre Munroe on: 02/23/2023 03:56 PM   Modules accepted: Orders

## 2023-02-23 NOTE — Progress Notes (Signed)
Subjective:    Patient ID: Grant Young, male    DOB: Jul 03, 1946, 77 y.o.   MRN: [id number]  HPI  Patient presents to clinic today with complaint of worsening headaches, facial pressure, nasal congestion, and cough. This started 2-3 weeks ago. He is blowing clear mucous out of his nose. He denies runny nose, ear pain, sore throat, shortness of breath, fever, chills or body aches. He has tried Fioricet and Compazine with minimal relief of symptoms. He has Zyrtec and Atrovent but he has not been taking. He has been off Hydrocodone about 2 weeks. He does feel like his chronic pain has worsened.   Review of Systems     Past Medical History:  Diagnosis Date   Anemia    Aneurysm of ascending aorta (HCC)    a.) CT chest 08/18/2022: 4.1 cm; b.) CT chest 12/07/2022: 4.2 cm   Anxiety    a.) on BZO (diazepam) PRN   Aortic atherosclerosis (HCC)    Chronic back pain    Coronary artery disease    a.) s/p PCI with DES x 2 (pLCx and o-mLAD) 11/27/2021   Depression    Erectile dysfunction    a.) Bi-mix (papaverine + phentolamine) injections + exogenous testosterone injections   GERD (gastroesophageal reflux disease)    h/o Lyme disease    Headache    Hiatal hernia    History of bilateral cataract extraction 01/2021   History of gastritis    History of kidney stones    History of neuropathy    Hyperlipidemia LDL goal <70    a.) CAD with NSTEMI -->  intolerant of statins with statin myopathy and memory issues.;  Also intolerant of Repatha   Hypertension    Labile blood pressures but dizziness with blood pressures in the "normal range"; intolerant of most medications including ARB's, ACE inhibitor's, amlodipine most beta-blockers other than propranolol.   Insomnia    a.) takes malatonin PRN   Left lower lobe pulmonary nodule    a.) chest CT 12/07/2022: nodules x 2 posteromedial LLL;  8 x 11 mm and 8 x 10 mm   Long term current use of antithrombotics/antiplatelets    a.) DAPT (ASA +  clopidogrel)   Long term current use of aromatase inhibitor    a,) anastrozole --> estridiol suppression secondary to exogenous testosterone use   Lumbar radicular pain    Myocardial infarction (HCC)    a.) MI x 2 - 1990 * 1999 - PTCA   NSTEMI (non-ST elevated myocardial infarction) (HCC) 11/27/2021   a.) LHC 11/28/2021: sequential 75% o-pLAD, 70% OM2, 80% p-mLAD, 99% mLAD, 100% pLCx --> PCI placing a 2.5 x 26 mm Onyx Frontier DES to pLCx and a 2.5 x 24 mm Onyx Frontier DES to the o-m LAD (covering 3 lesions)   Overactive bladder    Pneumonia    PONV (postoperative nausea and vomiting)    Recurrent herpes labialis    a.) has suppressive valacyclovir to use PRN   Skin cancer, basal cell    Spinal cord stimulator status    01/08/21 - not currently using.   Squamous cell skin cancer    Substance abuse (HCC)     Current Outpatient Medications  Medication Sig Dispense Refill   anastrozole (ARIMIDEX) 1 MG tablet 1/2 tablet (0.5 mg) by mouth once weekly     butalbital-acetaminophen-caffeine (FIORICET) 50-325-40 MG tablet TAKE 1 TABLET BY MOUTH EVERY 6 HOURS AS NEEDED FOR HEADACHE. 90 tablet 0   cetirizine (ZYRTEC)  10 MG tablet Take 10 mg by mouth daily as needed for allergies.     Cholecalciferol 50 MCG (2000 UT) CAPS Take 1 tablet by mouth daily.     clopidogrel (PLAVIX) 75 MG tablet TAKE 1 TABLET BY MOUTH ONCE DAILY. 90 tablet 2   Cyanocobalamin (VITAMIN B-12) 5000 MCG LOZG Take 1 lozenge by mouth daily.      DHEA 25 MG CAPS Take 25 mg by mouth daily.     diazepam (VALIUM) 5 MG tablet TAKE 1 TABLET IN THE MORNING AND 2 TABLETS AT BEDTIME 90 tablet 0   dicyclomine (BENTYL) 10 MG capsule Take 1 capsule (10 mg total) by mouth 4 (four) times daily -  before meals and at bedtime. 90 capsule 1   Digestive Aids Mixture (DIGESTION GB PO) Take 1 capsule by mouth daily.     ezetimibe (ZETIA) 10 MG tablet TAKE 1 TABLET BY MOUTH EVERY DAY 90 tablet 2   ferrous sulfate 325 (65 FE) MG tablet Take 325 mg  by mouth. Once daily on Mondays, Wednesdays and Fridays     ipratropium (ATROVENT) 0.06 % nasal spray Place 1 spray into both nostrils 4 (four) times daily as needed for rhinitis. For up to 5-7 days then stop. 15 mL 0   Loperamide HCl (IMODIUM PO) Take by mouth as needed.      MAGNESIUM BISGLYCINATE PO Take by mouth daily.     melatonin 5 MG TABS Take 5 mg by mouth at bedtime.     NONFORMULARY OR COMPOUNDED ITEM Bi-Mix Papaverine 30mg , Phentolamine 1mg    Dosage: Inject 1cc per injection    Vial 1ml   Qty #5 Refills 6 (Patient taking differently: as needed.  Bi-Mix Papaverine 30mg , Phentolamine 1mg    Dosage: Inject 1cc per injection    Vial 1ml   Qty #5 Refills 6) 5 each 6   nortriptyline (PAMELOR) 10 MG capsule Take 10 mg in the morning and 30 mg at night and continue that dose     Nutritional Supplements (NUTRITIONAL SUPPLEMENT PO) Take 1 capsule by mouth daily. [name]     polyethylene glycol (MIRALAX / GLYCOLAX) 17 g packet Take 17 g by mouth daily as needed.     POTASSIUM PO Take 1 tablet by mouth once a week. Takes 5x weekly.     Probiotic Product (PROBIOTIC DAILY PO) Take by mouth daily.     prochlorperazine (COMPAZINE) 10 MG tablet Take 1 tablet (10 mg total) by mouth 2 (two) times daily as needed for nausea or vomiting. 180 tablet 0   propranolol ER (INDERAL LA) 80 MG 24 hr capsule Take 1 capsule (80 mg total) by mouth daily. 90 capsule 3   pyridostigmine (MESTINON) 60 MG tablet Take 0.5 tablets (30 mg total) by mouth 2 (two) times daily. 90 tablet 0   RABEprazole (ACIPHEX) 20 MG tablet TAKE 1 TABLET BY MOUTH TWICE A DAY 180 tablet 3   sucralfate (CARAFATE) 1 g tablet Take 1 g by mouth 4 (four) times daily as needed.      testosterone cypionate (DEPOTESTOSTERONE CYPIONATE) 200 MG/ML injection Inject 80 mg into the muscle every 14 (fourteen) days.     valACYclovir (VALTREX) 1000 MG tablet Take 2 tabs p.o. and repeat in 12 hours as needed for cold sore 30 tablet  0   venlafaxine XR (EFFEXOR XR) 75 MG 24 hr capsule Take 1 capsule (75 mg total) by mouth daily with breakfast. (Patient taking differently: Take 75 mg by mouth  daily. Take with supper - 5pm) 90 capsule 1   No current facility-administered medications for this visit.    Allergies  Allergen Reactions   Ace Inhibitors     Other reaction(s): Cough   Fluoxetine Anxiety    "made me fall asleep" per pt "bad headaches and "makes  Me  Crazy" historical allergy noted in [employer] "made me fall asleep" per pt "bad headaches and "makes  Me  Crazy" Per New Patient Packet.     Metoclopramide     Other reaction(s): Other (See Comments), Other (See Comments), Unknown Tardive Dyskinesia  historical allergy noted in [employer] Tardive Dyskinesia  Per New Patient Packet.   Nalbuphine     Used Post Back surgery- Anesthesiologist Error. Patient had Narcotic Withdraw. Per New Patient Packet.    Other     Other reaction(s): Other (See Comments) Altered mental status in combo with narcotics at previous hospitalization - Full Withdrawal Symptoms Other reaction(s): Rash   Amoxicillin-Pot Clavulanate Nausea Only    Per New Patient Packet.   Doxazosin Rash    Other reaction(s): Other - See Comments, Rash UNKNOWN REACTION UNKNOWN REACTION    Duloxetine Nausea Only    Per New Patient Packet.    Penicillins Nausea Only    Per New Patient Packet.   Tamsulosin Itching and Anxiety    Restless, Flushing, Heavy Chest, Itching, Hyperactive mood and Anxiety. Unable to handle side effects. Per New Patient Packet.     Trazodone And Nefazodone Itching, Anxiety and Rash    Headache. "INCREASED MY ANXIETY AND HEARTRATE" Flushing, tachycardia "INCREASED MY ANXIETY AND HEARTRATE" Per New Patient Packet.    Amlodipine     Shaking, unsure of reaction. Per New Patient Packet.     Cinoxacin     GI Intolerance, and Dizziness. Per New Patient Packet.    Ciprofloxacin     Other reaction(s): Unknown    Fludrocortisone Other (See Comments)    "Worsening headaches, GI issues, fatigue"   Nebivolol     Other reaction(s): Unknown   Olanzapine     Headache and unable to sleep for 3 nights. Per New Patient Packet.    Olmesartan     Other reaction(s): Unknown   Pregabalin     Confusion, Lack of concentration, dizziness, and likely drowsiness. Per New Patient Packet.     Prostaglandins     Other reaction(s): Other (See Comments) Intolerance   Thyroid Hormones     Other reaction(s): Other (See Comments) Thyroid (Nature Thyroid) contraindicated with some of your other medications.   Zolpidem     Nightmares, Ineffective after 2 days. Per New Patient Packet.    Duloxetine Hcl     Other reaction(s): Rash   Fluoxetine Hcl     Other reaction(s): Rash   Nucynta [Tapentadol] Other (See Comments)    Vertigo    Phenytoin Anxiety    Hyperactivity, and Ineffective. Per New Patient Packet.     Family History  Problem Relation Age of Onset   Heart disease Father    Heart failure Father    Hypertension Father    Stroke Father    Stroke Mother    Dementia Mother    Kidney Stones Daughter    Anxiety disorder Daughter    OCD Daughter     Social History   Socioeconomic History   Marital status: Married    Spouse name: Bette   Number of children: Not on file   Years of education: Not on file   Highest  education level: Not on file  Occupational History   Not on file  Tobacco Use   Smoking status: Never    Passive exposure: Never   Smokeless tobacco: Never  Vaping Use   Vaping Use: Never used  Substance and Sexual Activity   Alcohol use: Yes    Comment: 1 Drink a Month, socially   Drug use: Not Currently   Sexual activity: Yes    Birth control/protection: None  Other Topics Concern   Not on file  Social History Narrative   Tobacco use, amount per day now: None   Past tobacco use, amount per day: None   How many years did you use tobacco: 0   Alcohol use (drinks per week): 0-1  Month   Diet:   Do you drink/eat things with caffeine: Occasionally ( Hot Chocolate and maybe 1/4 of 16oz Pepsi 2-3 times a week.   Marital status: Married                                  What year were you married? 1970   Do you live in a house, apartment, assisted living, condo, trailer, etc.? House   Is it one or more stories? One   How many persons live in your home? 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 Yes                                    Type and how often? [place], Recumbent Bike, 3 times a week. Try to get a 1.2 mile walk at least 3 times a week or more.     Do you have a living will? Yes   Do you have a DNR form?  No                                 If not, do you want to discuss one?   Do you have signed POA/HPOA forms? Yes                       If so, please bring to you appointment    Do you have any difficulty bathing or dressing yourself? No    Do you have difficulty preparing food or eating? No   Do you have difficulty managing your medications? No   Do you have any difficulty managing your finances? No   Do you have any difficulty affording your medications? No         Social Determinants of Health   Financial Resource Strain: Low Risk  (12/03/2022)   Overall Financial Resource Strain (CARDIA)    Difficulty of Paying Living Expenses: Not hard at all  Food Insecurity: No Food Insecurity (12/03/2022)   Hunger Vital Sign    Worried About Running Out of Food in the Last Year: Never true    Ran Out of Food in the Last Year: Never true  Transportation Needs: No Transportation Needs (12/03/2022)   PRAPARE - Administrator, Civil Service (Medical): No    Lack of Transportation (Non-Medical): No  Physical  Activity: Insufficiently Active (12/03/2022)   Exercise Vital Sign    Days of Exercise  per Week: 3 days    Minutes of Exercise per Session: 30 min  Stress: No Stress Concern Present (12/03/2022)   [employer] of Occupational Health - Occupational Stress Questionnaire    Feeling of Stress : Not at all  Social Connections: Moderately Integrated (12/03/2022)   Social Connection and Isolation Panel [NHANES]    Frequency of Communication with Friends and Family: Twice a week    Frequency of Social Gatherings with Friends and Family: More than three times a week    Attends Religious Services: More than 4 times per year    Active Member of [employer] or Organizations: No    Attends Banker Meetings: Never    Marital Status: Married  Catering manager Violence: Not At Risk (12/03/2022)   Humiliation, Afraid, Rape, and Kick questionnaire    Fear of Current or Ex-Partner: No    Emotionally Abused: No    Physically Abused: No    Sexually Abused: No     Constitutional: Patient reports frequent headaches.  Denies fever, malaise, fatigue, or abrupt weight changes.  HEENT: Pt reports nasal congestion. Denies eye pain, eye redness, ear pain, ringing in the ears, wax buildup, runny nose, bloody nose, or sore throat. Respiratory: Pt reports cough. Denies difficulty breathing, shortness of breath, or sputum production.   Cardiovascular: Denies chest pain, chest tightness, palpitations or swelling in the hands or feet.  Gastrointestinal: Patient reports intermittent diarrhea.  Denies abdominal pain, bloating, constipation, or blood in the stool.  Musculoskeletal: Patient reports chronic back pain.  Denies decrease in range of motion, difficulty with gait, muscle pain or joint swelling.  Neurological: Patient reports tremor, restless legs, neuropathy.  Denies dizziness, difficulty with memory, difficulty with speech or problems with balance and coordination.    No other specific complaints in a complete review of systems (except as listed in HPI above).  Objective:   Physical  Exam   BP 134/88 (BP Location: Right Arm, Patient Position: Sitting, Cuff Size: Normal)   Pulse 73   Temp (!) 96.9 F (36.1 C) (Temporal)   Wt 139 lb (63 kg)   SpO2 98%   BMI 20.53 kg/m   Wt Readings from Last 3 Encounters:  02/02/23 139 lb (63 kg)  12/31/22 142 lb (64.4 kg)  12/29/22 142 lb (64.4 kg)    General: Appears his stated age, chronically ill appearing, in NAD. HEENT: Head: normal shape and size, pain with palpation of the frontal, ethmoidal and maxillary sinuses; Eyes: sclera white, no icterus, conjunctiva pink, PERRLA and EOMs intact;  Nose: mucosa pink and moist, septum midline; Throat/Mouth: Teeth present, mucosa erythematous and moist, no exudate, lesions or ulcerations noted.  Neck:  No adenopathy noted. Cardiovascular: Normal rate and rhythm. S1,S2 noted.  No murmur, rubs or gallops noted. No JVD or BLE edema. No carotid bruits noted. Pulmonary/Chest: Normal effort and positive vesicular breath sounds. No respiratory distress. No wheezes, rales or ronchi noted.  Neurological: Alert and oriented.   BMET    Component Value Date/Time   NA 142 02/04/2023 0850   NA 146 (H) 12/26/2021 1524   K 3.8 02/04/2023 0850   CL 105 02/04/2023 0850   CO2 29 02/04/2023 0850   GLUCOSE 95 02/04/2023 0850   BUN 14 02/04/2023 0850   BUN 14 12/26/2021 1524   CREATININE 0.84 02/04/2023 0850   CREATININE 0.76 09/18/2022 1025   CALCIUM 9.0 02/04/2023 0850  GFRNONAA >60 02/04/2023 0850   GFRAA 101 05/20/2020 0000    Lipid Panel     Component Value Date/Time   CHOL 169 02/04/2023 0850   TRIG 106 02/04/2023 0850   HDL 44 02/04/2023 0850   CHOLHDL 3.8 02/04/2023 0850   VLDL 21 02/04/2023 0850   LDLCALC 104 (H) 02/04/2023 0850   LDLCALC 103 (H) 11/11/2021 1006    CBC    Component Value Date/Time   WBC 5.8 01/13/2023 1019   RBC 5.23 01/13/2023 1019   HGB 15.7 01/13/2023 1019   HGB 14.4 12/26/2021 1524   HCT 47.5 01/13/2023 1019   HCT 43.5 12/26/2021 1524   PLT 199  01/13/2023 1019   PLT 202 12/26/2021 1524   MCV 90.8 01/13/2023 1019   MCV 90 12/26/2021 1524   MCH 30.0 01/13/2023 1019   MCHC 33.1 01/13/2023 1019   RDW 12.7 01/13/2023 1019   RDW 13.3 12/26/2021 1524   LYMPHSABS 1.2 04/19/2022 0952   MONOABS 1.2 (H) 04/19/2022 0952   EOSABS 0.0 04/19/2022 0952   BASOSABS 0.0 04/19/2022 0952    Hgb A1C Lab Results  Component Value Date   HGBA1C 5.1 10/17/2019           Assessment & Plan:   Acute Pansinusitis:  No CT imaging of the sinus to review Start the Zyrtec and Atrovent as previously prescribed RX for Azithromax x 5 days RX for Prednisone 20 mg daily  Update me for new or worsening symptoms  RTC in 3 months for follow-up of chronic conditions  Nicki Reaper, NP

## 2023-02-23 NOTE — Patient Instructions (Signed)

## 2023-02-27 ENCOUNTER — Encounter: Payer: Self-pay | Admitting: Internal Medicine

## 2023-03-01 MED ORDER — SUCRALFATE 1 G PO TABS: 1.0000 g | ORAL_TABLET | Freq: Four times a day (QID) | ORAL | 1 refills | Status: DC | PRN

## 2023-03-03 LAB — ACID FAST CULTURE WITH REFLEXED SENSITIVITIES (MYCOBACTERIA): Acid Fast Culture: NEGATIVE

## 2023-03-08 ENCOUNTER — Encounter: Payer: Self-pay | Admitting: Internal Medicine

## 2023-03-09 ENCOUNTER — Telehealth: Payer: Self-pay

## 2023-03-09 MED ORDER — BUTALBITAL-ASPIRIN-CAFFEINE 50-325-40 MG PO CAPS: 1.0000 | ORAL_CAPSULE | Freq: Two times a day (BID) | ORAL | 0 refills | Status: DC | PRN

## 2023-03-09 NOTE — Addendum Note (Signed)
Addended by: Lorre Munroe on: 03/09/2023 08:39 AM   Modules accepted: Orders

## 2023-03-09 NOTE — Telephone Encounter (Signed)
Insurance Treatment Denial Note  Date order was entered:  Order entered by: Edward Jolly, MD Requested treatment: PNS Reason for denial: Treatment not covered by plan. Recommended for approval:  nothing else I can do   Workers comp denied the PNS. I sent it to level 3 appeals, which is the final determination and they denied. There is nothing else I can do.

## 2023-03-21 ENCOUNTER — Other Ambulatory Visit: Payer: Self-pay | Admitting: Internal Medicine

## 2023-03-21 DIAGNOSIS — F419 Anxiety disorder, unspecified: Secondary | ICD-10-CM

## 2023-03-22 NOTE — Telephone Encounter (Signed)
No longer on current medication list Requested Prescriptions  Pending Prescriptions Disp Refills   escitalopram (LEXAPRO) 20 MG tablet [Pharmacy Med Name: ESCITALOPRAM 20 MG TABLET] 90 tablet 1    Sig: TAKE 1 TABLET BY MOUTH EVERY DAY     Psychiatry:  Antidepressants - SSRI Passed - 03/21/2023  8:43 AM      Passed - Completed PHQ-2 or PHQ-9 in the last 360 days      Passed - Valid encounter within last 6 months    Recent Outpatient Visits           3 weeks ago Acute non-recurrent pansinusitis   [facility] [place], [place] W, NP   4 months ago Coronary artery disease involving native coronary artery of native heart without angina pectoris   [place] [facility] [place], Salvadore Oxford, NP   4 months ago Essential hypertension   [place] [facility] [place], Gentry Fitz A, RPH-CPP   6 months ago Generalized abdominal pain   [place] [facility] [place], Salvadore Oxford, NP   6 months ago Atypical pneumonia   [facility] [place], Salvadore Oxford, NP       Future Appointments             In 2 days  [facility]   In 1 month Herbie Baltimore, Piedad Climes, MD [facility] [place]   In 1 month Richardo Hanks, Laurette Schimke, MD [facility] [place]   In 2 months Warner, Salvadore Oxford, NP [place] [facility], [facility]

## 2023-03-23 MED ORDER — DIAZEPAM 5 MG PO TABS: ORAL_TABLET | ORAL | 0 refills | Status: DC

## 2023-03-23 NOTE — Addendum Note (Signed)
Addended by: Lorre Munroe on: 03/23/2023 08:15 AM   Modules accepted: Orders

## 2023-03-24 ENCOUNTER — Telehealth: Payer: Self-pay | Admitting: Pharmacist

## 2023-03-24 ENCOUNTER — Other Ambulatory Visit (HOSPITAL_COMMUNITY): Payer: Self-pay

## 2023-03-24 ENCOUNTER — Telehealth: Payer: Self-pay

## 2023-03-24 ENCOUNTER — Ambulatory Visit: Payer: Medicare Other | Attending: Cardiology | Admitting: Student

## 2023-03-24 DIAGNOSIS — E785 Hyperlipidemia, unspecified: Secondary | ICD-10-CM | POA: Diagnosis present

## 2023-03-24 NOTE — Progress Notes (Signed)
Patient ID: Grant Young                 DOB: 10/10/1945                    MRN: [id number]      HPI: Grant Young is a 77 y.o. male patient referred to lipid clinic by Dr.Harding. PMH is significant for CAD, s/p PCI, statin myopathy, HLD, labile HTN, RLS, OAB, GERD, IBS.  Per Dr. Herbie Baltimore "Patient was on Repatha, had concerning side effects and therefore stopped taking them.  Question was could this been related to him with weaning off his pain medications versus true side effect.LDL went up from 34-105 being off of statin, and TC increased from 105-169 which is where he was 1 year ago"  Patient presented today with his wife. Reports he was on Repatha which made his chronic back pain and headache worst. He had stopped for about 3 months to see if that was the cause for worsening pain, pain was lessen during that 3 months and as soon as he restarted the injection within couple of weeks his pain worsened. He went off of Norco which he was on since 2021 but per him this whole trial of Repatha was before he going thorough doe tapering of Norco.  We discussed other LDLc lowering options - Praluent, Leqvio and bempedoic acid. Discussed mechanisms of action, dosing, side effects and potential decreases in LDL cholesterol.  Also reviewed cost information and potential options for patient assistance.  Patient agreed on trying Praluent first.  Current Medications: ezetimibe 10 mg daily  Intolerances: statins and Repatha - myalgia  Risk Factors: CAD, s/p PCI,  HLD, HTN,  LDL goal: <70 mg/dl   Labs: Lipid Panel     Component Value Date/Time   CHOL 169 02/04/2023 0850   TRIG 106 02/04/2023 0850   HDL 44 02/04/2023 0850   CHOLHDL 3.8 02/04/2023 0850   VLDL 21 02/04/2023 0850   LDLCALC 104 (H) 02/04/2023 0850   LDLCALC 103 (H) 11/11/2021 1006   LDLDIRECT 98.5 01/30/2022 1535    Past Medical History:  Diagnosis Date   Anemia    Aneurysm of ascending aorta (HCC)    a.) CT chest 08/18/2022: 4.1  cm; b.) CT chest 12/07/2022: 4.2 cm   Anxiety    a.) on BZO (diazepam) PRN   Aortic atherosclerosis (HCC)    Chronic back pain    Coronary artery disease    a.) s/p PCI with DES x 2 (pLCx and o-mLAD) 11/27/2021   Depression    Erectile dysfunction    a.) Bi-mix (papaverine + phentolamine) injections + exogenous testosterone injections   GERD (gastroesophageal reflux disease)    h/o Lyme disease    Headache    Hiatal hernia    History of bilateral cataract extraction 01/2021   History of gastritis    History of kidney stones    History of neuropathy    Hyperlipidemia LDL goal <70    a.) CAD with NSTEMI -->  intolerant of statins with statin myopathy and memory issues.;  Also intolerant of Repatha   Hypertension    Labile blood pressures but dizziness with blood pressures in the "normal range"; intolerant of most medications including ARB's, ACE inhibitor's, amlodipine most beta-blockers other than propranolol.   Insomnia    a.) takes malatonin PRN   Left lower lobe pulmonary nodule    a.) chest CT 12/07/2022: nodules x 2 posteromedial LLL;  8  x 11 mm and 8 x 10 mm   Long term current use of antithrombotics/antiplatelets    a.) DAPT (ASA + clopidogrel)   Long term current use of aromatase inhibitor    a,) anastrozole --> estridiol suppression secondary to exogenous testosterone use   Lumbar radicular pain    Myocardial infarction (HCC)    a.) MI x 2 - 1990 * 1999 - PTCA   NSTEMI (non-ST elevated myocardial infarction) (HCC) 11/27/2021   a.) LHC 11/28/2021: sequential 75% o-pLAD, 70% OM2, 80% p-mLAD, 99% mLAD, 100% pLCx --> PCI placing a 2.5 x 26 mm Onyx Frontier DES to pLCx and a 2.5 x 24 mm Onyx Frontier DES to the o-m LAD (covering 3 lesions)   Overactive bladder    Pneumonia    PONV (postoperative nausea and vomiting)    Recurrent herpes labialis    a.) has suppressive valacyclovir to use PRN   Skin cancer, basal cell    Spinal cord stimulator status    01/08/21 - not  currently using.   Squamous cell skin cancer    Substance abuse (HCC)     Current Outpatient Medications on File Prior to Visit  Medication Sig Dispense Refill   Cholecalciferol 50 MCG (2000 UT) CAPS Take 1 tablet by mouth daily.     clopidogrel (PLAVIX) 75 MG tablet TAKE 1 TABLET BY MOUTH ONCE DAILY. 90 tablet 2   Cyanocobalamin (VITAMIN B-12) 5000 MCG LOZG Take 1 lozenge by mouth daily.      DHEA 25 MG CAPS Take 25 mg by mouth daily.     diazepam (VALIUM) 5 MG tablet TAKE 1 TABLET IN THE MORNING AND 2 TABLETS AT BEDTIME 90 tablet 0   dicyclomine (BENTYL) 10 MG capsule Take 1 capsule (10 mg total) by mouth 4 (four) times daily -  before meals and at bedtime. 90 capsule 1   Digestive Aids Mixture (DIGESTION GB PO) Take 1 capsule by mouth daily.     ezetimibe (ZETIA) 10 MG tablet TAKE 1 TABLET BY MOUTH EVERY DAY 90 tablet 2   ferrous sulfate 325 (65 FE) MG tablet Take 325 mg by mouth. Once daily on Mondays, Wednesdays and Fridays     ipratropium (ATROVENT) 0.06 % nasal spray Place 1 spray into both nostrils 4 (four) times daily as needed for rhinitis. For up to 5-7 days then stop. 15 mL 0   Loperamide HCl (IMODIUM PO) Take by mouth as needed.      MAGNESIUM BISGLYCINATE PO Take by mouth daily.     melatonin 5 MG TABS Take 5 mg by mouth at bedtime.     nortriptyline (PAMELOR) 10 MG capsule Take 10 mg in the morning and 30 mg at night and continue that dose     Nutritional Supplements (NUTRITIONAL SUPPLEMENT PO) Take 1 capsule by mouth daily. [name]     polyethylene glycol (MIRALAX / GLYCOLAX) 17 g packet Take 17 g by mouth daily as needed.     POTASSIUM PO Take 1 tablet by mouth once a week. Takes 5x weekly.     Probiotic Product (PROBIOTIC DAILY PO) Take by mouth daily.     prochlorperazine (COMPAZINE) 10 MG tablet Take 1 tablet (10 mg total) by mouth 2 (two) times daily as needed for nausea or vomiting. 180 tablet 0   propranolol ER (INDERAL LA) 80 MG 24 hr capsule Take 1  capsule (80 mg total) by mouth daily. 90 capsule 3   pyridostigmine (MESTINON) 60 MG  tablet Take 0.5 tablets (30 mg total) by mouth 2 (two) times daily. 90 tablet 0   RABEprazole (ACIPHEX) 20 MG tablet TAKE 1 TABLET BY MOUTH TWICE A DAY 180 tablet 3   sucralfate (CARAFATE) 1 g tablet Take 1 tablet (1 g total) by mouth 4 (four) times daily as needed. 360 tablet 1   testosterone cypionate (DEPOTESTOSTERONE CYPIONATE) 200 MG/ML injection Inject 80 mg into the muscle every 14 (fourteen) days.     valACYclovir (VALTREX) 1000 MG tablet Take 2 tabs p.o. and repeat in 12 hours as needed for cold sore 30 tablet 0   venlafaxine XR (EFFEXOR XR) 75 MG 24 hr capsule Take 1 capsule (75 mg total) by mouth daily with breakfast. (Patient taking differently: Take 75 mg by mouth daily. Take with supper - 5pm) 90 capsule 1   anastrozole (ARIMIDEX) 1 MG tablet 1/2 tablet (0.5 mg) by mouth once weekly     cetirizine (ZYRTEC) 10 MG tablet Take 10 mg by mouth daily as needed for allergies.     NONFORMULARY OR COMPOUNDED ITEM Bi-Mix Papaverine 30mg , Phentolamine 1mg    Dosage: Inject 1cc per injection    Vial 1ml   Qty #5 Refills 6 5 each 6   No current facility-administered medications on file prior to visit.    Allergies  Allergen Reactions   Ace Inhibitors     Other reaction(s): Cough   Fluoxetine Anxiety    "made me fall asleep" per pt "bad headaches and "makes  Me  Crazy" historical allergy noted in [employer] "made me fall asleep" per pt "bad headaches and "makes  Me  Crazy" Per New Patient Packet.     Metoclopramide     Other reaction(s): Other (See Comments), Other (See Comments), Unknown Tardive Dyskinesia  historical allergy noted in [employer] Tardive Dyskinesia  Per New Patient Packet.   Nalbuphine     Used Post Back surgery- Anesthesiologist Error. Patient had Narcotic Withdraw. Per New Patient Packet.    Other     Other reaction(s): Other (See Comments) Altered mental status in combo with  narcotics at previous hospitalization - Full Withdrawal Symptoms Other reaction(s): Rash   Amoxicillin-Pot Clavulanate Nausea Only    Per New Patient Packet.   Doxazosin Rash    Other reaction(s): Other - See Comments, Rash UNKNOWN REACTION UNKNOWN REACTION    Duloxetine Nausea Only    Per New Patient Packet.    Penicillins Nausea Only    Per New Patient Packet.   Tamsulosin Itching and Anxiety    Restless, Flushing, Heavy Chest, Itching, Hyperactive mood and Anxiety. Unable to handle side effects. Per New Patient Packet.     Trazodone And Nefazodone Itching, Anxiety and Rash    Headache. "INCREASED MY ANXIETY AND HEARTRATE" Flushing, tachycardia "INCREASED MY ANXIETY AND HEARTRATE" Per New Patient Packet.    Amlodipine     Shaking, unsure of reaction. Per New Patient Packet.     Cinoxacin     GI Intolerance, and Dizziness. Per New Patient Packet.    Ciprofloxacin     Other reaction(s): Unknown   Fludrocortisone Other (See Comments)    "Worsening headaches, GI issues, fatigue"   Nebivolol     Other reaction(s): Unknown   Olanzapine     Headache and unable to sleep for 3 nights. Per New Patient Packet.    Olmesartan     Other reaction(s): Unknown   Pregabalin     Confusion, Lack of concentration, dizziness, and likely drowsiness. Per New Patient Packet.  Prostaglandins     Other reaction(s): Other (See Comments) Intolerance   Thyroid Hormones     Other reaction(s): Other (See Comments) Thyroid (Nature Thyroid) contraindicated with some of your other medications.   Zolpidem     Nightmares, Ineffective after 2 days. Per New Patient Packet.    Duloxetine Hcl     Other reaction(s): Rash   Fluoxetine Hcl     Other reaction(s): Rash   Nucynta [Tapentadol] Other (See Comments)    Vertigo    Phenytoin Anxiety    Hyperactivity, and Ineffective. Per New Patient Packet.     Assessment/Plan:  1. Hyperlipidemia -  Problem  Hyperlipidemia Ldl Goal <70    Hyperlipidemia LDL goal <70 Assessment:  LDL goal: < 70 mg/dl last LDLc 161 mg/dl Tolerates Zetia  well without any side effects  Intolerance to statins and Repatha - myalgia  Discussed next potential options (Praluent, bempedoic acid and inclisiran); cost, dosing efficacy, side effects   Plan: Continue taking current medications Ezetimibe 10 mg daily  Will apply for PA for Praluent; will inform patient upon approval  Lipid lab due in 2-3 months after starting PCSK9i   Thank you,  Carmela Hurt, Pharm.D [facility] of [facility] [address], [place], [place] [address]  Phone: [phone]; Fax: [phone]

## 2023-03-24 NOTE — Assessment & Plan Note (Signed)
Assessment:  LDL goal: < 70 mg/dl last LDLc 829 mg/dl Tolerates Zetia  well without any side effects  Intolerance to statins and Repatha - myalgia  Discussed next potential options (Praluent, bempedoic acid and inclisiran); cost, dosing efficacy, side effects   Plan: Continue taking current medications Ezetimibe 10 mg daily  Will apply for PA for Praluent; will inform patient upon approval  Lipid lab due in 2-3 months after starting [facility]

## 2023-03-24 NOTE — Telephone Encounter (Signed)
Pharmacy Patient Advocate Encounter   Received notification from [facility] that prior authorization for Praluent is required/requested.   PA submitted to [facility] via CoverMyMeds Key or (Medicaid) confirmation # [id number]  Status is pending

## 2023-03-25 ENCOUNTER — Inpatient Hospital Stay
Admission: EM | Admit: 2023-03-25 | Discharge: 2023-03-27 | DRG: 305 | Disposition: A | Discharge status: Home / Self Care | Payer: Medicare Other | Attending: Internal Medicine | Admitting: Internal Medicine

## 2023-03-25 ENCOUNTER — Other Ambulatory Visit (HOSPITAL_COMMUNITY): Payer: Self-pay

## 2023-03-25 ENCOUNTER — Other Ambulatory Visit: Payer: Self-pay

## 2023-03-25 ENCOUNTER — Ambulatory Visit: Payer: Self-pay | Admitting: *Deleted

## 2023-03-25 ENCOUNTER — Emergency Department: Payer: Medicare Other

## 2023-03-25 DIAGNOSIS — Z85828 Personal history of other malignant neoplasm of skin: Secondary | ICD-10-CM

## 2023-03-25 DIAGNOSIS — Z79890 Hormone replacement therapy (postmenopausal): Secondary | ICD-10-CM | POA: Diagnosis not present

## 2023-03-25 DIAGNOSIS — Z8701 Personal history of pneumonia (recurrent): Secondary | ICD-10-CM | POA: Diagnosis not present

## 2023-03-25 DIAGNOSIS — I6789 Other cerebrovascular disease: Secondary | ICD-10-CM | POA: Diagnosis present

## 2023-03-25 DIAGNOSIS — Z9049 Acquired absence of other specified parts of digestive tract: Secondary | ICD-10-CM

## 2023-03-25 DIAGNOSIS — Z880 Allergy status to penicillin: Secondary | ICD-10-CM | POA: Diagnosis not present

## 2023-03-25 DIAGNOSIS — E349 Endocrine disorder, unspecified: Secondary | ICD-10-CM | POA: Diagnosis present

## 2023-03-25 DIAGNOSIS — I2510 Atherosclerotic heart disease of native coronary artery without angina pectoris: Secondary | ICD-10-CM | POA: Diagnosis present

## 2023-03-25 DIAGNOSIS — Z8673 Personal history of transient ischemic attack (TIA), and cerebral infarction without residual deficits: Secondary | ICD-10-CM

## 2023-03-25 DIAGNOSIS — Z7902 Long term (current) use of antithrombotics/antiplatelets: Secondary | ICD-10-CM | POA: Diagnosis not present

## 2023-03-25 DIAGNOSIS — R4782 Fluency disorder in conditions classified elsewhere: Secondary | ICD-10-CM | POA: Diagnosis present

## 2023-03-25 DIAGNOSIS — Z888 Allergy status to other drugs, medicaments and biological substances status: Secondary | ICD-10-CM

## 2023-03-25 DIAGNOSIS — I951 Orthostatic hypotension: Secondary | ICD-10-CM | POA: Diagnosis present

## 2023-03-25 DIAGNOSIS — Z955 Presence of coronary angioplasty implant and graft: Secondary | ICD-10-CM

## 2023-03-25 DIAGNOSIS — I252 Old myocardial infarction: Secondary | ICD-10-CM

## 2023-03-25 DIAGNOSIS — I674 Hypertensive encephalopathy: Secondary | ICD-10-CM | POA: Diagnosis present

## 2023-03-25 DIAGNOSIS — Z87442 Personal history of urinary calculi: Secondary | ICD-10-CM | POA: Diagnosis not present

## 2023-03-25 DIAGNOSIS — I161 Hypertensive emergency: Principal | ICD-10-CM | POA: Diagnosis present

## 2023-03-25 DIAGNOSIS — E785 Hyperlipidemia, unspecified: Secondary | ICD-10-CM | POA: Diagnosis present

## 2023-03-25 DIAGNOSIS — Z79811 Long term (current) use of aromatase inhibitors: Secondary | ICD-10-CM | POA: Diagnosis not present

## 2023-03-25 DIAGNOSIS — Z79899 Other long term (current) drug therapy: Secondary | ICD-10-CM

## 2023-03-25 DIAGNOSIS — Z881 Allergy status to other antibiotic agents status: Secondary | ICD-10-CM | POA: Diagnosis not present

## 2023-03-25 DIAGNOSIS — N3281 Overactive bladder: Secondary | ICD-10-CM | POA: Diagnosis present

## 2023-03-25 DIAGNOSIS — Z818 Family history of other mental and behavioral disorders: Secondary | ICD-10-CM

## 2023-03-25 DIAGNOSIS — G9340 Encephalopathy, unspecified: Secondary | ICD-10-CM | POA: Diagnosis present

## 2023-03-25 DIAGNOSIS — Z9682 Presence of neurostimulator: Secondary | ICD-10-CM

## 2023-03-25 DIAGNOSIS — Z823 Family history of stroke: Secondary | ICD-10-CM

## 2023-03-25 DIAGNOSIS — I10 Essential (primary) hypertension: Secondary | ICD-10-CM | POA: Diagnosis present

## 2023-03-25 DIAGNOSIS — Z9842 Cataract extraction status, left eye: Secondary | ICD-10-CM

## 2023-03-25 DIAGNOSIS — Z8249 Family history of ischemic heart disease and other diseases of the circulatory system: Secondary | ICD-10-CM

## 2023-03-25 DIAGNOSIS — Z9841 Cataract extraction status, right eye: Secondary | ICD-10-CM

## 2023-03-25 LAB — COMPREHENSIVE METABOLIC PANEL
ALT: 22 U/L (ref 0–44)
AST: 25 U/L (ref 15–41)
Albumin: 4.6 g/dL (ref 3.5–5.0)
Alkaline Phosphatase: 128 U/L — ABNORMAL HIGH (ref 38–126)
Anion gap: 11 (ref 5–15)
BUN: 14 mg/dL (ref 8–23)
CO2: 27 mmol/L (ref 22–32)
Calcium: 9.3 mg/dL (ref 8.9–10.3)
Chloride: 102 mmol/L (ref 98–111)
Creatinine, Ser: 0.74 mg/dL (ref 0.61–1.24)
GFR, Estimated: >60 mL/min (ref >60)
Glucose, Bld: 103 mg/dL — ABNORMAL HIGH (ref 70–99)
Potassium: 3.4 mmol/L — ABNORMAL LOW (ref 3.5–5.1)
Sodium: 140 mmol/L (ref 135–145)
Total Bilirubin: 0.7 mg/dL (ref 0.3–1.2)
Total Protein: 7.5 g/dL (ref 6.5–8.1)

## 2023-03-25 LAB — CBC
HCT: 51.7 % (ref 39.0–52.0)
HCT: 53.4 % — ABNORMAL HIGH (ref 39.0–52.0)
Hemoglobin: 17 g/dL (ref 13.0–17.0)
Hemoglobin: 17.7 g/dL — ABNORMAL HIGH (ref 13.0–17.0)
MCH: 29.9 pg (ref 26.0–34.0)
MCH: 29.4 pg (ref 26.0–34.0)
MCHC: 32.9 g/dL (ref 30.0–36.0)
MCHC: 33.1 g/dL (ref 30.0–36.0)
MCV: 90.9 fL (ref 80.0–100.0)
MCV: 88.7 fL (ref 80.0–100.0)
Platelets: 216 10*3/uL (ref 150–400)
Platelets: 219 10*3/uL (ref 150–400)
RBC: 5.69 MIL/uL (ref 4.22–5.81)
RBC: 6.02 MIL/uL — ABNORMAL HIGH (ref 4.22–5.81)
RDW: 13.1 % (ref 11.5–15.5)
RDW: 13 % (ref 11.5–15.5)
WBC: 7.5 10*3/uL (ref 4.0–10.5)
WBC: 7.1 10*3/uL (ref 4.0–10.5)
nRBC: 0 % (ref 0.0–0.2)
nRBC: 0 % (ref 0.0–0.2)

## 2023-03-25 LAB — TROPONIN I (HIGH SENSITIVITY)
Troponin I (High Sensitivity): 20 ng/L — ABNORMAL HIGH (ref <18)
Troponin I (High Sensitivity): 22 ng/L — ABNORMAL HIGH (ref <18)

## 2023-03-25 LAB — CREATININE, SERUM
Creatinine, Ser: 0.75 mg/dL (ref 0.61–1.24)
GFR, Estimated: >60 mL/min (ref >60)

## 2023-03-25 LAB — URINALYSIS, ROUTINE W REFLEX MICROSCOPIC
Bilirubin Urine: NEGATIVE
Glucose, UA: NEGATIVE mg/dL
Hgb urine dipstick: NEGATIVE
Ketones, ur: 5 mg/dL — AB
Leukocytes,Ua: NEGATIVE
Nitrite: NEGATIVE
Protein, ur: NEGATIVE mg/dL
Specific Gravity, Urine: 1.005 (ref 1.005–1.030)
pH: 7 (ref 5.0–8.0)

## 2023-03-25 LAB — MAGNESIUM: Magnesium: 2.4 mg/dL (ref 1.7–2.4)

## 2023-03-25 LAB — GLUCOSE, CAPILLARY: Glucose-Capillary: 105 mg/dL — ABNORMAL HIGH (ref 70–99)

## 2023-03-25 MED ORDER — TESTOSTERONE CYPIONATE 200 MG/ML IM SOLN
80.0000 mg | INTRAMUSCULAR | Status: DC
  Administered 2023-03-26: 80 mg via INTRAMUSCULAR
  Filled 2023-03-25: qty 0.4

## 2023-03-25 MED ORDER — ATORVASTATIN CALCIUM 20 MG PO TABS
80.0000 mg | ORAL_TABLET | ORAL | Status: AC
  Administered 2023-03-25: 80 mg via ORAL
  Filled 2023-03-25: qty 4

## 2023-03-25 MED ORDER — ACETAMINOPHEN 325 MG PO TABS
650.0000 mg | ORAL_TABLET | Freq: Four times a day (QID) | ORAL | Status: DC | PRN
  Administered 2023-03-26 (×2): 650 mg via ORAL
  Filled 2023-03-25 (×2): qty 2

## 2023-03-25 MED ORDER — ACETAMINOPHEN 650 MG RE SUPP: 650.0000 mg | Freq: Four times a day (QID) | RECTAL | Status: DC | PRN

## 2023-03-25 MED ORDER — ANASTROZOLE 1 MG PO TABS: 0.5000 mg | ORAL_TABLET | ORAL | Status: DC

## 2023-03-25 MED ORDER — ENOXAPARIN SODIUM 40 MG/0.4ML IJ SOSY
40.0000 mg | PREFILLED_SYRINGE | INTRAMUSCULAR | Status: DC
  Administered 2023-03-26 – 2023-03-27 (×2): 40 mg via SUBCUTANEOUS
  Filled 2023-03-25 (×2): qty 0.4

## 2023-03-25 MED ORDER — NITROPRUSSIDE SODIUM-NACL 20-0.9 MG/100ML-% IV SOLN
0.0000 ug/kg/min | INTRAVENOUS | Status: DC
  Filled 2023-03-25: qty 100

## 2023-03-25 MED ORDER — NICARDIPINE HCL IN NACL 20-0.86 MG/200ML-% IV SOLN
3.0000 mg/h | INTRAVENOUS | Status: DC
  Administered 2023-03-25 – 2023-03-26 (×2): 5 mg/h via INTRAVENOUS
  Filled 2023-03-25: qty 200

## 2023-03-25 MED ORDER — ASPIRIN 300 MG RE SUPP
300.0000 mg | Freq: Every day | RECTAL | Status: DC
  Filled 2023-03-25: qty 1

## 2023-03-25 MED ORDER — POTASSIUM CHLORIDE 20 MEQ PO PACK
40.0000 meq | PACK | Freq: Once | ORAL | Status: AC
  Administered 2023-03-25: 40 meq via ORAL
  Filled 2023-03-25: qty 2

## 2023-03-25 MED ORDER — PANTOPRAZOLE SODIUM 20 MG PO TBEC
20.0000 mg | DELAYED_RELEASE_TABLET | Freq: Every day | ORAL | Status: DC
  Administered 2023-03-26 – 2023-03-27 (×2): 20 mg via ORAL
  Filled 2023-03-25 (×2): qty 1

## 2023-03-25 MED ORDER — SODIUM CHLORIDE 0.9% FLUSH
3.0000 mL | Freq: Two times a day (BID) | INTRAVENOUS | Status: DC
  Administered 2023-03-25 – 2023-03-26 (×2): 3 mL via INTRAVENOUS

## 2023-03-25 MED ORDER — LABETALOL HCL 5 MG/ML IV SOLN: 20.0000 mg | Freq: Once | INTRAVENOUS | Status: DC

## 2023-03-25 MED ORDER — ASPIRIN 325 MG PO TABS
325.0000 mg | ORAL_TABLET | Freq: Every day | ORAL | Status: DC
  Administered 2023-03-25: 325 mg via ORAL
  Filled 2023-03-25: qty 1

## 2023-03-25 MED ORDER — POLYETHYLENE GLYCOL 3350 17 G PO PACK: 17.0000 g | PACK | Freq: Every day | ORAL | Status: DC | PRN

## 2023-03-25 MED ORDER — CHLORHEXIDINE GLUCONATE CLOTH 2 % EX PADS
6.0000 | MEDICATED_PAD | Freq: Every day | CUTANEOUS | Status: DC
  Administered 2023-03-25 – 2023-03-26 (×2): 6 via TOPICAL

## 2023-03-25 MED ORDER — CLOPIDOGREL BISULFATE 75 MG PO TABS
75.0000 mg | ORAL_TABLET | Freq: Every day | ORAL | Status: DC
  Administered 2023-03-26 – 2023-03-27 (×2): 75 mg via ORAL
  Filled 2023-03-25 (×2): qty 1

## 2023-03-25 MED ORDER — PROPRANOLOL HCL ER 80 MG PO CP24
80.0000 mg | ORAL_CAPSULE | Freq: Every day | ORAL | Status: DC
  Administered 2023-03-26 – 2023-03-27 (×2): 80 mg via ORAL
  Filled 2023-03-25 (×2): qty 1

## 2023-03-25 MED ORDER — PYRIDOSTIGMINE BROMIDE 60 MG PO TABS
30.0000 mg | ORAL_TABLET | Freq: Two times a day (BID) | ORAL | Status: DC
  Administered 2023-03-25 – 2023-03-27 (×4): 30 mg via ORAL
  Filled 2023-03-25 (×4): qty 0.5

## 2023-03-25 MED ORDER — STROKE: EARLY STAGES OF RECOVERY BOOK: Freq: Once | Status: AC

## 2023-03-25 MED ORDER — SODIUM CHLORIDE 0.9 % IV SOLN: INTRAVENOUS | Status: DC

## 2023-03-25 MED ORDER — HYDRALAZINE HCL 50 MG PO TABS
25.0000 mg | ORAL_TABLET | Freq: Three times a day (TID) | ORAL | Status: DC
  Administered 2023-03-25 – 2023-03-27 (×5): 25 mg via ORAL
  Filled 2023-03-25 (×5): qty 1

## 2023-03-25 MED ORDER — POTASSIUM CHLORIDE 10 MEQ/100ML IV SOLN
10.0000 meq | INTRAVENOUS | Status: DC
  Filled 2023-03-25 (×2): qty 100

## 2023-03-25 NOTE — ED Triage Notes (Signed)
BIB [facility] from [facility]. Reports onset of HTN, tremors, h/a, and dizziness after swimming in the pool ~2 pm. Wife reports near syncopal event while leaving the pharmacy and lowered himself onto the ground. Denies hitting head or LOC at that time. Upon returning home pt checked his bp and was found to be HTN. He took 25 mg of Hydralazine at that time, ~4:30 pm. Wife reports pt was stuttering and having difficulty getting words out at this time. Wife then called for EMT evaluation. Pt is alert and oriented on arrival and following commands. Pt denies parasthesia. Pupils are equal and reactive. No deficit in strength or coordination noted. No facial droop or aphasia noted. No stuttering or difficulty with speech noted. Pt reports eating normally today prior to swimming in the pool. Pt does endorse mild h/a and states that dizziness has resolved. Pt denies chest pain or SOB.

## 2023-03-25 NOTE — Assessment & Plan Note (Signed)
Markedly elevated blood pressure on presentation with troponin leak and concerned that the neurologic manifestations could be from hypertensive emergency per se.  At this time patient has been started on nicardipine infusion with a target blood pressure of below 180 mmHg.  Detailed history could not elicit as to why patient may have hypertensive emergency like this.  Given association with tremors, I will check a thyroid cascade, ordered for metanephrines and aldosterone level in the morning.  Other considerations include serotonin syndrome, as patient said serotonin agent was recently increased.  However patient is calm, not diaphoretic and not altered, this makes the diagnosis of serotonin syndrome less likely.  However out of an abundance of caution I will hold the patient serotonergic agents for tonight.  And give patient low-dose Valium if his condition worsens in any way

## 2023-03-25 NOTE — Assessment & Plan Note (Addendum)
Resume suplementation. Due now. Patient not able to advise me why he is on anastrozole. Will c.w. anastrozole for now. C.w. DHEA. Check DHEA level.

## 2023-03-25 NOTE — Assessment & Plan Note (Signed)
Prior documented diagnosis in the chart . Patient reports taking pyridostigmine for this. C.w. same for now.

## 2023-03-25 NOTE — Telephone Encounter (Signed)
Pt has not returned call. Forwarding encounter to office for follow up.

## 2023-03-25 NOTE — Telephone Encounter (Signed)
Called pt  for update. Left message on machine to return our call.

## 2023-03-25 NOTE — Telephone Encounter (Signed)
  Chief Complaint: dizziness, mild headache elevated BP  Symptoms: [facility] spell [facility] felt like he was going to fall to left side and sat down until dizziness passed. Scraped elbow when sitting on curb. Concerned regarding changes of dose in medication pamelor. Reports mild headache , BP 192/116 and rechecked in approx 5 minutes 200/118 during call with NT. Patient reports cardiologist advised patient to take hydralazine 25 mg for elevated BP. Medication not on med list. Patient took hydralazine 25 mg approx 1620. Reports tremors in hands continue since pamelor dose changes and reports after exercising noted whole body shaking / tremors.  Frequency: today  Pertinent Negatives: Patient denies chest pain  no difficulty breathing no weakness on either side of body. No blurred vision no slurred speech  Disposition: [x] ED /[] Urgent Care (no appt availability in office) / [] Appointment(In office/virtual)/ []  [facility]/ [] Home Care/ [] Refused Recommended Disposition /[] [facility]/ []  Follow-up with PCP Additional Notes:   Recommended to go to ED now. Patient requesting to allow hydralazine to decrease BP and if it does not he will go to ED. Recommended if sx worsen call 911.     Reason for Disposition  Patient sounds very sick or weak to the triager  Answer Assessment - Initial Assessment Questions 1. DESCRIPTION: "Describe your dizziness."     Lightheaded.  2. LIGHTHEADED: "Do you feel lightheaded?" (e.g., somewhat faint, woozy, weak upon standing)     Woozy  3. VERTIGO: "Do you feel like either you or the room is spinning or tilting?" (i.e. vertigo)     na 4. SEVERITY: "How bad is it?"  "Do you feel like you are going to faint?" "Can you stand and walk?"   - MILD: Feels slightly dizzy, but walking normally.   - MODERATE: Feels unsteady when walking, but not falling; interferes with normal activities (e.g., school, work).   - SEVERE: Unable to walk without  falling, or requires assistance to walk without falling; feels like passing out now.      Dizziness at times  fell today coming out of pharmacy  5. ONSET:  "When did the dizziness begin?"     On and off  6. AGGRAVATING FACTORS: "Does anything make it worse?" (e.g., standing, change in head position)     Standing  7. HEART RATE: "Can you tell me your heart rate?" "How many beats in 15 seconds?"  (Note: not all patients can do this)       na 8. CAUSE: "What do you think is causing the dizziness?"     Not sure medication related  9. RECURRENT SYMPTOM: "Have you had dizziness before?" If Yes, ask: "When was the last time?" "What happened that time?"     Yes  10. OTHER SYMPTOMS: "Do you have any other symptoms?" (e.g., fever, chest pain, vomiting, diarrhea, bleeding)       Dizziness, mild headache elevated BP 192/116. Worsening tremors to hands and body  11. PREGNANCY: "Is there any chance you are pregnant?" "When was your last menstrual period?"       na  Protocols used: Dizziness - Lightheadedness-A-AH

## 2023-03-25 NOTE — Assessment & Plan Note (Signed)
Manifesting as tremor, which is the likely underlying factor in patient's stutter.  As well as reported lean on the left side by the patient earlier today that has since resolved which I would equate as vertigo.  We certainly have to rule out cerebrovascular accident given patient's prior several infarcts documented on the CAT scan.  Therefore I have ordered stroke order set with plan to treat the patient with aspirin.  Statin, check an MRI -MRI may be delayed because patient does have a spinal cord stimulator and that needs to be figured out by the MRI department.  Wife will bring battery and remote control for tomorrow.

## 2023-03-25 NOTE — ED Provider Notes (Signed)
[facility] Provider Note    Event Date/Time   First MD Initiated Contact with Patient 03/25/23 2022     (approximate)   History   Weakness and Hypertension   HPI  Grant Young is a 77 y.o. male history of CAD, hypertension, history of prior CVA presents with changes in speech and dizziness.  Patient reports that he was in a exercise class at the pool at [facility] today, he started to feel generally weak with wobbly legs.  He was able to leave and shower.  He went to the pharmacy with his significant other and as he was coming out he had an episode of dizziness (which he notes he has had multiple times before ), he was able to sit down without falling.  However since that time this afternoon he has had intermittent episodes of stuttering speech which is atypical for him.  It started around 2 or 3 PM     Physical Exam   Triage Vital Signs: ED Triage Vitals  Encounter Vitals Group     BP 03/25/23 1941 (!) 217/111     Systolic BP Percentile --      Diastolic BP Percentile --      Pulse Rate 03/25/23 1941 87     Resp 03/25/23 1941 18     Temp 03/25/23 1941 97.6 F (36.4 C)     Temp Source 03/25/23 1941 Oral     SpO2 03/25/23 1941 100 %     Weight 03/25/23 1942 63.5 kg (140 lb)     Height 03/25/23 1942 1.753 m (5\' 9" )     Head Circumference --      Peak Flow --      Pain Score 03/25/23 1941 0     Pain Loc --      Pain Education --      Exclude from Growth Chart --     Most recent vital signs: Vitals:   03/25/23 1941 03/25/23 2045  BP: (!) 217/111 (!) 237/125  Pulse: 87 81  Resp: 18 15  Temp: 97.6 F (36.4 C)   SpO2: 100% 100%     General: Awake, no distress.  CV:  Good peripheral perfusion.  Resp:  Normal effort.  Abd:  No distention.  Other:  Cranial nerves II through XII are normal, patient is able to converse with me well but does have intermittent stuttering episodes and almost involuntary movements.  Normal strength in all  extremities.   ED Results / Procedures / Treatments   Labs (all labs ordered are listed, but only abnormal results are displayed) Labs Reviewed  URINALYSIS, ROUTINE W REFLEX MICROSCOPIC - Abnormal; Notable for the following components:      Result Value   Color, Urine STRAW (*)    APPearance CLEAR (*)    Ketones, ur 5 (*)    All other components within normal limits  COMPREHENSIVE METABOLIC PANEL - Abnormal; Notable for the following components:   Potassium 3.4 (*)    Glucose, Bld 103 (*)    Alkaline Phosphatase 128 (*)    All other components within normal limits  TROPONIN I (HIGH SENSITIVITY) - Abnormal; Notable for the following components:   Troponin I (High Sensitivity) 20 (*)    All other components within normal limits  CBC     EKG  ED ECG REPORT I, Jene Every, the attending physician, personally viewed and interpreted this ECG.  Date: 03/25/2023  Rhythm: normal sinus rhythm QRS Axis: normal  Intervals: normal ST/T Wave abnormalities: normal Narrative Interpretation: no evidence of acute ischemia    RADIOLOGY CT head without acute abnormality per radiology    PROCEDURES:  Critical Care performed: yes  CRITICAL CARE Performed by: Jene Every   Total critical care time: 30 minutes  Critical care time was exclusive of separately billable procedures and treating other patients.  Critical care was necessary to treat or prevent imminent or life-threatening deterioration.  Critical care was time spent personally by me on the following activities: development of treatment plan with patient and/or surrogate as well as nursing, discussions with consultants, evaluation of patient's response to treatment, examination of patient, obtaining history from patient or surrogate, ordering and performing treatments and interventions, ordering and review of laboratory studies, ordering and review of radiographic studies, pulse oximetry and re-evaluation of patient's  condition.   Procedures   MEDICATIONS ORDERED IN ED: Medications  nicardipine (CARDENE) 20mg  in 0.86% saline IV infusion (0.1 mg/ml) (5 mg/hr Intravenous New Bag/Given 03/25/23 2131)     IMPRESSION / MDM / ASSESSMENT AND PLAN / ED COURSE  I reviewed the triage vital signs and the nursing notes. Patient's presentation is most consistent with acute presentation with potential threat to life or bodily function.  Patient presents with dizziness, speech difficulties as detailed above.  Differential includes CVA, hypertensive encephalopathy, electrolyte abnormality  CT scan without acute abnormality.  Outside of the window for code stroke and patient's symptoms atypical for CVA and somewhat intermittent  He is markedly hypertensive which is atypical for him as well  Lab work is generally unremarkable, mild elevation of troponin patient has no chest pain or respiratory complaints.  Concerning for hypertensive encephalopathy/emergency, will start nicardipine drip  I have discussed with the hospitalist for admission        FINAL CLINICAL IMPRESSION(S) / ED DIAGNOSES   Final diagnoses:  Hypertensive encephalopathy     Rx / DC Orders   ED Discharge Orders     None        Note:  This document was prepared using Dragon voice recognition software and may include unintentional dictation errors.   Jene Every, MD 03/25/23 2140

## 2023-03-25 NOTE — ED Notes (Signed)
Patient transported to CT 

## 2023-03-25 NOTE — H&P (Addendum)
History and Physical    Patient: Grant Young:[id number] DOB: [birth year] DOA: 03/25/2023 DOS: the patient was seen and examined on 03/25/2023 PCP: Lorre Munroe, NP  Patient coming from: Home  Chief Complaint:  Chief Complaint  Patient presents with   Weakness   Hypertension   HPI: Grant Young is a 77 y.o. male with medical history significant of residence in the [place] elderly community.  Patient participated in pool aerobic activities at approximately 2 PM, got tired there as he usually does and subsequently had a shower and then went to [facility] at approximately 4 PM.  By all accounts patient was feeling at his baseline when he went to [facility] at 4 PM.  As he was stepping out of there, patient reports of feeling "pulled "to the left side.  This is corroborated by his wife at the bedside who noted that the patient was leaning to the left.  Anticipating a fall, patient sat down on a chair.  Subsequently felt much better and was able to get back up and go home.  Unfortunately at home patient was noted to have a stutter and his speech.  This is new and that patient has had 1 prior episode several weeks ago of stuttering speech that was very transient and resolved on its own.  However this 1 was persistent.  There is no report of patient having any weakness in any extremity patient does not report vertigo.  No vision changes.  Patient contacted primary care provider over the phone, took blood pressure at home which was found to be markedly elevated.  Patient is brought to the ER.  ER course is notable for persistent stuttering of speech.  Elevated blood pressure, patient has been started on a nicardipine infusion   Review of Systems: As mentioned in the history of present illness. All other systems reviewed and are negative. Past Medical History:  Diagnosis Date   Anemia    Aneurysm of ascending aorta (HCC)    a.) CT chest 08/18/2022: 4.1 cm; b.) CT chest 12/07/2022: 4.2 cm   Anxiety     a.) on BZO (diazepam) PRN   Aortic atherosclerosis (HCC)    Chronic back pain    Coronary artery disease    a.) s/p PCI with DES x 2 (pLCx and o-mLAD) 11/27/2021   Depression    Erectile dysfunction    a.) Bi-mix (papaverine + phentolamine) injections + exogenous testosterone injections   GERD (gastroesophageal reflux disease)    h/o Lyme disease    Headache    Hiatal hernia    History of bilateral cataract extraction 01/2021   History of gastritis    History of kidney stones    History of neuropathy    Hyperlipidemia LDL goal <70    a.) CAD with NSTEMI -->  intolerant of statins with statin myopathy and memory issues.;  Also intolerant of Repatha   Hypertension    Labile blood pressures but dizziness with blood pressures in the "normal range"; intolerant of most medications including ARB's, ACE inhibitor's, amlodipine most beta-blockers other than propranolol.   Insomnia    a.) takes malatonin PRN   Left lower lobe pulmonary nodule    a.) chest CT 12/07/2022: nodules x 2 posteromedial LLL;  8 x 11 mm and 8 x 10 mm   Long term current use of antithrombotics/antiplatelets    a.) DAPT (ASA + clopidogrel)   Long term current use of aromatase inhibitor    a,) anastrozole -->  estridiol suppression secondary to exogenous testosterone use   Lumbar radicular pain    Myocardial infarction [facility])    a.) MI x 2 - 1990 * 1999 - PTCA   NSTEMI (non-ST elevated myocardial infarction) (HCC) 11/27/2021   a.) LHC 11/28/2021: sequential 75% o-pLAD, 70% OM2, 80% p-mLAD, 99% mLAD, 100% pLCx --> PCI placing a 2.5 x 26 mm Onyx Frontier DES to pLCx and a 2.5 x 24 mm Onyx Frontier DES to the o-m LAD (covering 3 lesions)   Overactive bladder    Pneumonia    PONV (postoperative nausea and vomiting)    Recurrent herpes labialis    a.) has suppressive valacyclovir to use PRN   Skin cancer, basal cell    Spinal cord stimulator status    01/08/21 - not currently using.   Squamous cell skin cancer     Substance abuse [facility])    Past Surgical History:  Procedure Laterality Date   BACK SURGERY     BASAL CELL CARCINOMA EXCISION     CATARACT EXTRACTION W/PHACO Left 01/14/2021   Procedure: CATARACT EXTRACTION PHACO AND INTRAOCULAR LENS PLACEMENT (IOC) LEFT VIVITY TORIC LENS 8.75 00:56.7;  Surgeon: Galen Manila, MD;  Location: [facility];  Service: Ophthalmology;  Laterality: Left;   CATARACT EXTRACTION W/PHACO Right 01/28/2021   Procedure: CATARACT EXTRACTION PHACO AND INTRAOCULAR LENS PLACEMENT (IOC) RIGHT VIVITY TORIC LENS;  Surgeon: Galen Manila, MD;  Location: [facility];  Service: Ophthalmology;  Laterality: Right;  6.54 00:46.4   CHOLECYSTECTOMY  2017   COLONOSCOPY  09/15/2015   Per New Patient Packet   COLONOSCOPY WITH PROPOFOL N/A 10/03/2020   Procedure: COLONOSCOPY WITH PROPOFOL;  Surgeon: Wyline Mood, MD;  Location: [facility];  Service: Gastroenterology;  Laterality: N/A;   CORONARY/GRAFT ACUTE MI REVASCULARIZATION N/A 11/27/2021   Procedure: Coronary/Graft Acute MI Revascularization;  Surgeon: Marykay Lex, MD;  Location: [facility] CATH: (NSTEMI) - > 100% prox-mid LCx (Onyx Frontier DES 2.5 x 26 -> 2.7 mm, Ost  OM1 65%.  Ost-mid LAD 3 lesions 75%, 90%, 99% => DES PCI Onyx Frontier DES 2.5 x 34 -> 2.8 mm   ESOPHAGOGASTRODUODENOSCOPY (EGD) WITH PROPOFOL N/A 10/03/2020   Procedure: ESOPHAGOGASTRODUODENOSCOPY (EGD) WITH PROPOFOL;  Surgeon: Wyline Mood, MD;  Location[facility];  Service: Gastroenterology;  Laterality: N/A;   FOOT SURGERY Bilateral 03/18/2020   FOOT SURGERY  08/032021   GALLBLADDER SURGERY  09/15/2015   Gallbladder Removal. Procedure done by Dr.Beverly. Per New Patient Packet   KIDNEY STONE SURGERY  09/14/1980   Too many to count. Per New Patient Packet 09/14/1980-09/15/1995   KIDNEY STONE SURGERY  09/15/1995   Too many to count. Per New Patient Packet   LEFT HEART CATH AND CORONARY ANGIOGRAPHY N/A 11/27/2021   Procedure: LEFT HEART  CATH AND CORONARY ANGIOGRAPHY;  Surgeon: Marykay Lex, MD;  Location: [facility] CATH:  NSTEMI - 2 V CAD: 100% thrombotic prox-mid LCx (DES PCI), ost OM1 65%; Ost LAD 75% - prox LAD 90%, prox-mid 99% (DES PCI).  MIld diffuse RCA disease - calcicifed.  R-L collaterals filling LCx. EF ~45-50% with lateral HK. LVEDP 28 mmHg   LITHOTRIPSY  09/14/2014   Per New Patient Packet   LITHOTRIPSY  09/14/1996   No Stints Used. Per New Patient Packet   LITHOTRIPSY  09/14/1997   No Stints used. Per New Patient Packet   PAIN PUMP IMPLANTATION     PAIN PUMP REMOVAL     SHOULDER SURGERY Right 1984   SIGMOIDOSCOPY  09/14/2017  Per New Patient Packet   SPINAL CORD STIMULATOR IMPLANT     TONSILLECTOMY     TRANSTHORACIC ECHOCARDIOGRAM  11/28/2021   (in setting of non-STEMI): EF 50 to 55%.  Suspect inferior and lateral hypokinesis.  Indeterminate diastolic parameters.  Normal RV size and function.  Normal RAP.  Normal aortic and mitral valves with only mild MR and AI.  Normal RVP and RAP.   UPPER GI ENDOSCOPY     VIDEO BRONCHOSCOPY WITH ENDOBRONCHIAL ULTRASOUND N/A 01/15/2023   Procedure: VIDEO BRONCHOSCOPY WITH ENDOBRONCHIAL ULTRASOUND;  Surgeon: Vida Rigger, MD;  Location: [facility] ORS;  Service: Thoracic;  Laterality: N/A;   Social History:  reports that he has never smoked. He has never been exposed to tobacco smoke. He has never used smokeless tobacco. He reports current alcohol use. He reports that he does not currently use drugs.  Allergies  Allergen Reactions   Ace Inhibitors     Other reaction(s): Cough   Fluoxetine Anxiety    "made me fall asleep" per pt "bad headaches and "makes  Me  Crazy" historical allergy noted in [employer] "made me fall asleep" per pt "bad headaches and "makes  Me  Crazy" Per New Patient Packet.     Metoclopramide     Other reaction(s): Other (See Comments), Other (See Comments), Unknown Tardive Dyskinesia  historical allergy noted in [employer] Tardive Dyskinesia  Per New  Patient Packet.   Nalbuphine     Used Post Back surgery- Anesthesiologist Error. Patient had Narcotic Withdraw. Per New Patient Packet.    Other     Other reaction(s): Other (See Comments) Altered mental status in combo with narcotics at previous hospitalization - Full Withdrawal Symptoms Other reaction(s): Rash   Amoxicillin-Pot Clavulanate Nausea Only    Per New Patient Packet.   Doxazosin Rash    Other reaction(s): Other - See Comments, Rash UNKNOWN REACTION UNKNOWN REACTION    Duloxetine Nausea Only    Per New Patient Packet.    Penicillins Nausea Only    Per New Patient Packet.   Tamsulosin Itching and Anxiety    Restless, Flushing, Heavy Chest, Itching, Hyperactive mood and Anxiety. Unable to handle side effects. Per New Patient Packet.     Trazodone And Nefazodone Itching, Anxiety and Rash    Headache. "INCREASED MY ANXIETY AND HEARTRATE" Flushing, tachycardia "INCREASED MY ANXIETY AND HEARTRATE" Per New Patient Packet.    Amlodipine     Shaking, unsure of reaction. Per New Patient Packet.     Cinoxacin     GI Intolerance, and Dizziness. Per New Patient Packet.    Ciprofloxacin     Other reaction(s): Unknown   Fludrocortisone Other (See Comments)    "Worsening headaches, GI issues, fatigue"   Nebivolol     Other reaction(s): Unknown   Olanzapine     Headache and unable to sleep for 3 nights. Per New Patient Packet.    Olmesartan     Other reaction(s): Unknown   Pregabalin     Confusion, Lack of concentration, dizziness, and likely drowsiness. Per New Patient Packet.     Prostaglandins     Other reaction(s): Other (See Comments) Intolerance   Thyroid Hormones     Other reaction(s): Other (See Comments) Thyroid (Nature Thyroid) contraindicated with some of your other medications.   Zolpidem     Nightmares, Ineffective after 2 days. Per New Patient Packet.    Duloxetine Hcl     Other reaction(s): Rash   Fluoxetine Hcl     Other reaction(s):  Rash    Nucynta [Tapentadol] Other (See Comments)    Vertigo    Phenytoin Anxiety    Hyperactivity, and Ineffective. Per New Patient Packet.     Family History  Problem Relation Age of Onset   Heart disease Father    Heart failure Father    Hypertension Father    Stroke Father    Stroke Mother    Dementia Mother    Kidney Stones Daughter    Anxiety disorder Daughter    OCD Daughter     Prior to Admission medications   Medication Sig Start Date End Date Taking? Authorizing Provider  cetirizine (ZYRTEC) 10 MG tablet Take 10 mg by mouth daily as needed for allergies.   Yes [provider]  Cholecalciferol 50 MCG (2000 UT) CAPS Take 1 tablet by mouth daily. 04/15/20  Yes [provider]  clopidogrel (PLAVIX) 75 MG tablet TAKE 1 TABLET BY MOUTH ONCE DAILY. 12/17/22  Yes Marykay Lex, MD  Cyanocobalamin (VITAMIN B-12) 5000 MCG LOZG Take 1 lozenge by mouth daily.    Yes [provider]  DHEA 25 MG CAPS Take 25 mg by mouth daily.   Yes [provider]  diazepam (VALIUM) 5 MG tablet TAKE 1 TABLET IN THE MORNING AND 2 TABLETS AT BEDTIME 03/23/23  Yes [name], Salvadore Oxford, NP  dicyclomine (BENTYL) 10 MG capsule Take 1 capsule (10 mg total) by mouth 4 (four) times daily -  before meals and at bedtime. 06/12/22  Yes BaitySalvadore Oxford, NP  Digestive Aids Mixture (DIGESTION GB PO) Take 1 capsule by mouth daily.   Yes [provider]  ezetimibe (ZETIA) 10 MG tablet TAKE 1 TABLET BY MOUTH EVERY DAY 12/31/22  Yes [name], Salvadore Oxford, NP  ferrous sulfate 325 (65 FE) MG tablet Take 325 mg by mouth. Once daily on Mondays, Wednesdays and Fridays   Yes [provider]  hydrALAZINE (APRESOLINE) 25 MG tablet Take 25 mg by mouth 3 (three) times daily.   Yes [provider]  ipratropium (ATROVENT) 0.06 % nasal spray Place 1 spray into both nostrils 4 (four) times daily as needed for rhinitis. For up to 5-7 days then stop. 04/07/22  Yes Karamalegos, Netta Neat, DO   Loperamide HCl (IMODIUM PO) Take by mouth as needed.    Yes [provider]  MAGNESIUM BISGLYCINATE PO Take by mouth daily.   Yes [provider]  melatonin 5 MG TABS Take 5 mg by mouth at bedtime.   Yes [provider]  NONFORMULARY OR COMPOUNDED ITEM Bi-Mix Papaverine 30mg , Phentolamine 1mg    Dosage: Inject 1cc per injection    Vial 1ml   Qty #5 Refills 6 05/20/22  Yes Sondra Come, MD  nortriptyline (PAMELOR) 10 MG capsule Take 10 mg in the morning and 30 mg at night and continue that dose 10/08/22  Yes [provider]  Nutritional Supplements (NUTRITIONAL SUPPLEMENT PO) Take 1 capsule by mouth daily. [name]   Yes [provider]  polyethylene glycol (MIRALAX / GLYCOLAX) 17 g packet Take 17 g by mouth daily as needed.   Yes [provider]  POTASSIUM PO Take 1 tablet by mouth as directed. Takes 5x weekly.   Yes [provider]  Probiotic Product (PROBIOTIC DAILY PO) Take by mouth daily.   Yes [provider]  prochlorperazine (COMPAZINE) 10 MG tablet Take 1 tablet (10 mg total) by mouth 2 (two) times daily as needed for nausea or vomiting. 07/07/22  Yes Lorre Munroe, NP  propranolol ER (INDERAL LA) 80 MG 24 hr capsule Take 1 capsule (80 mg total) by mouth daily. 01/08/23  Yes Marykay Lex, MD  pyridostigmine (MESTINON) 60 MG tablet Take 0.5 tablets (30 mg total) by mouth 2 (two) times daily. 05/22/22  Yes [name], Salvadore Oxford, NP  RABEprazole (ACIPHEX) 20 MG tablet TAKE 1 TABLET BY MOUTH TWICE A DAY 06/29/22  Yes [name], Salvadore Oxford, NP  sucralfate (CARAFATE) 1 g tablet Take 1 tablet (1 g total) by mouth 4 (four) times daily as needed. 03/01/23 08/28/23 Yes [name], Salvadore Oxford, NP  testosterone cypionate (DEPOTESTOSTERONE CYPIONATE) 200 MG/ML injection Inject 80 mg into the muscle every 14 (fourteen) days. 11/27/20  Yes [provider]  valACYclovir (VALTREX) 1000 MG tablet Take 2 tabs p.o. and repeat  in 12 hours as needed for cold sore 10/29/21  Yes Lorre Munroe, NP  venlafaxine XR (EFFEXOR XR) 75 MG 24 hr capsule Take 1 capsule (75 mg total) by mouth daily with breakfast. Patient taking differently: Take 75 mg by mouth daily. Take with supper - 5pm 11/20/22  Yes [name], Salvadore Oxford, NP  anastrozole (ARIMIDEX) 1 MG tablet 1/2 tablet (0.5 mg) by mouth once weekly 05/07/21   [provider]   Wife reports patient send nortriptyline dose was increased to 50 mg at night in late June (2 weeks ago per patient) Physical Exam: Vitals:   03/25/23 2045 03/25/23 2130 03/25/23 2145 03/25/23 2200  BP: (!) 237/125 (!) 201/105 (!) 177/97 (!) 181/98  Pulse: 81 76 87 (!) 104  Resp: 15 11 14 18   Temp:      TempSrc:      SpO2: 100% 100% 100% 100%  Weight:      Height:       General -patient does not appear to be in immediate distress, wife at the bedside.  Patient however seems to have tremor of his facial muscles, occasional left upper extremity as well as the left lower extremity.  Patient states that he has had "benign tremor "for several weeks/months.  That started in the left upper extremity.  Patient is alert awake giving a coherent account of for the day today. Respiratory exam: Bilateral intravesicular Cardiovascular exam S1 is normal Abdomen soft nontender Extremities warm without edema Neurologic exam: Patient is alert awake, symmetric facies oriented to self giving coherent account of history today.  Patient has a slight stutter in speech.  However I suspect this is due to his tremor.  Which is present in also the left upper extremity and left lower extremity to a lesser extent.  There are no cerebellar signs that could be elicited.  There is no focal motor deficit that is noted.  Tone is normal.  There is no cogwheel rigidity..  Pupils are equal and round gazes are preserved. Data Reviewed:  Labs on Admission:  Results for orders placed or performed during the hospital encounter of  03/25/23 (from the past 24 hour(s))  CBC     Status: None   Collection Time: 03/25/23  7:50 PM  Result Value Ref Range   WBC 7.5 4.0 - 10.5 K/uL   RBC 5.69 4.22 - 5.81 MIL/uL   Hemoglobin 17.0 13.0 - 17.0 g/dL   HCT 10.2 72.5 - 36.6 %   MCV 90.9 80.0 - 100.0 fL   MCH 29.9 26.0 - 34.0 pg   MCHC 32.9 30.0 - 36.0 g/dL   RDW 44.0 34.7 - 42.5 %   Platelets  216 150 - 400 K/uL   nRBC 0.0 0.0 - 0.2 %  Urinalysis, Routine w reflex microscopic -Urine, Clean Catch     Status: Abnormal   Collection Time: 03/25/23  7:50 PM  Result Value Ref Range   Color, Urine STRAW (A) YELLOW   APPearance CLEAR (A) CLEAR   Specific Gravity, Urine 1.005 1.005 - 1.030   pH 7.0 5.0 - 8.0   Glucose, UA NEGATIVE NEGATIVE mg/dL   Hgb urine dipstick NEGATIVE NEGATIVE   Bilirubin Urine NEGATIVE NEGATIVE   Ketones, ur 5 (A) NEGATIVE mg/dL   Protein, ur NEGATIVE NEGATIVE mg/dL   Nitrite NEGATIVE NEGATIVE   Leukocytes,Ua NEGATIVE NEGATIVE  Comprehensive metabolic panel     Status: Abnormal   Collection Time: 03/25/23  7:50 PM  Result Value Ref Range   Sodium 140 135 - 145 mmol/L   Potassium 3.4 (L) 3.5 - 5.1 mmol/L   Chloride 102 98 - 111 mmol/L   CO2 27 22 - 32 mmol/L   Glucose, Bld 103 (H) 70 - 99 mg/dL   BUN 14 8 - 23 mg/dL   Creatinine, Ser 8.41 0.61 - 1.24 mg/dL   Calcium 9.3 8.9 - 32.4 mg/dL   Total Protein 7.5 6.5 - 8.1 g/dL   Albumin 4.6 3.5 - 5.0 g/dL   AST 25 15 - 41 U/L   ALT 22 0 - 44 U/L   Alkaline Phosphatase 128 (H) 38 - 126 U/L   Total Bilirubin 0.7 0.3 - 1.2 mg/dL   GFR, Estimated >40 >10 mL/min   Anion gap 11 5 - 15  Troponin I (High Sensitivity)     Status: Abnormal   Collection Time: 03/25/23  7:50 PM  Result Value Ref Range   Troponin I (High Sensitivity) 20 (H) <18 ng/L   Basic Metabolic Panel: Recent Labs  Lab 03/25/23 1950  NA 140  K 3.4*  CL 102  CO2 27  GLUCOSE 103*  BUN 14  CREATININE 0.74  CALCIUM 9.3   Liver Function Tests: Recent Labs  Lab 03/25/23 1950   AST 25  ALT 22  ALKPHOS 128*  BILITOT 0.7  PROT 7.5  ALBUMIN 4.6   No results for input(s): "LIPASE", "AMYLASE" in the last 168 hours. No results for input(s): "AMMONIA" in the last 168 hours. CBC: Recent Labs  Lab 03/25/23 1950  WBC 7.5  HGB 17.0  HCT 51.7  MCV 90.9  PLT 216   Cardiac Enzymes: Recent Labs  Lab 03/25/23 1950  TROPONINIHS 20*    BNP (last 3 results) No results for input(s): "PROBNP" in the last 8760 hours. CBG: No results for input(s): "GLUCAP" in the last 168 hours.  Radiological Exams on Admission:  CT HEAD WO CONTRAST  Result Date: 03/25/2023 CLINICAL DATA:  Syncope/presyncope, cerebrovascular cause suspected near syncope EXAM: CT HEAD WITHOUT CONTRAST TECHNIQUE: Contiguous axial images were obtained from the base of the skull through the vertex without intravenous contrast. RADIATION DOSE REDUCTION: This exam was performed according to the departmental dose-optimization program which includes automated exposure control, adjustment of the mA and/or kV according to patient size and/or use of iterative reconstruction technique. COMPARISON:  04/19/2022 FINDINGS: Brain: Old right frontal, parietal and occipital infarcts, stable. There is atrophy and chronic small vessel disease changes. No acute intracranial abnormality. Specifically, no hemorrhage, hydrocephalus, mass lesion, acute infarction, or significant intracranial injury. Vascular: No hyperdense vessel or unexpected calcification. Skull: No acute calvarial abnormality. 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 Atrophy, chronic microvascular disease. No acute intracranial abnormality. Electronically Signed   By: Charlett Nose M.D.   On: 03/25/2023 20:04    EKG: Independently reviewed. NSR   Assessment and Plan: * Encephalopathy acute Manifesting as tremor, which is the likely underlying factor in patient's stutter.  As well as  reported lean on the left side by the patient earlier today that has since resolved which I would equate as vertigo.  We certainly have to rule out cerebrovascular accident given patient's prior several infarcts documented on the CAT scan.  Therefore I have ordered stroke order set with plan to treat the patient with aspirin.  Statin, check an MRI -MRI may be delayed because patient does have a spinal cord stimulator and that needs to be figured out by the MRI department.  Wife will bring battery and remote control for tomorrow.   Hypertensive emergency Markedly elevated blood pressure on presentation with troponin leak and concerned that the neurologic manifestations could be from hypertensive emergency per se.  At this time patient has been started on nicardipine infusion with a target blood pressure of below 180 mmHg.  Detailed history could not elicit as to why patient may have hypertensive emergency like this.  Given association with tremors, I will check a thyroid cascade, ordered for metanephrines and aldosterone level in the morning.  Other considerations include serotonin syndrome, as patient said serotonin agent was recently increased.  However patient is calm, not diaphoretic and not altered, this makes the diagnosis of serotonin syndrome less likely.  However out of an abundance of caution I will hold the patient serotonergic agents for tonight.  And give patient low-dose Valium if his condition worsens in any way  Orthostasis Prior documented diagnosis in the chart . Patient reports taking pyridostigmine for this. C.w. same for now.  Hypotestosteronism Resume suplementation. Due now. Patient not able to advise me why he is on anastrozole. Will c.w. anastrozole for now. C.w. DHEA. Check DHEA level.   Home med rec done. C.w. plavix. Hold off on furhter aspirin at this time. C.w. chronic propranolol and hydralazine.    Advance Care Planning:   Code Status: Full Code   Consults:  none  Family Communication: wife at bedside. All questions ansewred.  Severity of Illness: The appropriate patient status for this patient is INPATIENT. Inpatient status is judged to be reasonable and necessary in order to provide the required intensity of service to ensure the patient's safety. The patient's presenting symptoms, physical exam findings, and initial radiographic and laboratory data in the context of their chronic comorbidities is felt to place them at high risk for further clinical deterioration. Furthermore, it is not anticipated that the patient will be medically stable for discharge from the hospital within 2 midnights of admission.   * I certify that at the point of admission it is my clinical judgment that the patient will require inpatient hospital care spanning beyond 2 midnights from the point of admission due to high intensity of service, high risk for further deterioration and high frequency of surveillance required.*  Author: Nolberto Hanlon, MD 03/25/2023 10:16 PM  For on call review [email or web address].

## 2023-03-25 NOTE — Telephone Encounter (Signed)
Pharmacy Patient Advocate Encounter  Received notification from [employer] that Prior Authorization for Praluent has been DENIED because ''The plans formulary alternatives are (REPATHA, NEXLETOL, OR EVKEEZA).Marland Kitchen  PA #/Case ID/Reference #: Key: [id number]  Please note ''No P/a is req'd for nexletol and a is about/around 30.00 and 60.10for a 

## 2023-03-25 NOTE — Telephone Encounter (Signed)
Called pt - left message o machine requesting a call back.

## 2023-03-25 NOTE — ED Triage Notes (Signed)
FIRST NURSE NOTE: pt arrived via [facility] c/o High BP , tremors, HA after exercising since 2pm.  Pt was exercising at swim center at [facility]. Pt resides at [facility].   p89 97%  224/119 CBG 98

## 2023-03-26 ENCOUNTER — Inpatient Hospital Stay: Payer: Medicare Other

## 2023-03-26 ENCOUNTER — Inpatient Hospital Stay: Admit: 2023-03-26 | Payer: Medicare Other

## 2023-03-26 ENCOUNTER — Encounter: Payer: Self-pay | Admitting: Student

## 2023-03-26 DIAGNOSIS — G9340 Encephalopathy, unspecified: Secondary | ICD-10-CM | POA: Diagnosis not present

## 2023-03-26 LAB — CBC
HCT: 47.8 % (ref 39.0–52.0)
Hemoglobin: 16 g/dL (ref 13.0–17.0)
MCH: 29.9 pg (ref 26.0–34.0)
MCHC: 33.5 g/dL (ref 30.0–36.0)
MCV: 89.2 fL (ref 80.0–100.0)
Platelets: 213 10*3/uL (ref 150–400)
RBC: 5.36 MIL/uL (ref 4.22–5.81)
RDW: 13.2 % (ref 11.5–15.5)
WBC: 6.9 10*3/uL (ref 4.0–10.5)
nRBC: 0 % (ref 0.0–0.2)

## 2023-03-26 LAB — BASIC METABOLIC PANEL
Anion gap: 8 (ref 5–15)
BUN: 13 mg/dL (ref 8–23)
CO2: 24 mmol/L (ref 22–32)
Calcium: 9 mg/dL (ref 8.9–10.3)
Chloride: 111 mmol/L (ref 98–111)
Creatinine, Ser: 0.61 mg/dL (ref 0.61–1.24)
GFR, Estimated: >60 mL/min (ref >60)
Glucose, Bld: 87 mg/dL (ref 70–99)
Potassium: 3.6 mmol/L (ref 3.5–5.1)
Sodium: 143 mmol/L (ref 135–145)

## 2023-03-26 LAB — PROTIME-INR
INR: 1 (ref 0.8–1.2)
Prothrombin Time: 13.3 seconds (ref 11.4–15.2)

## 2023-03-26 LAB — URINE DRUG SCREEN, QUALITATIVE (ARMC ONLY)
Amphetamines, Ur Screen: NOT DETECTED
Barbiturates, Ur Screen: POSITIVE — AB
Benzodiazepine, Ur Scrn: POSITIVE — AB
Cannabinoid 50 Ng, Ur ~~LOC~~: NOT DETECTED
Cocaine Metabolite,Ur ~~LOC~~: NOT DETECTED
MDMA (Ecstasy)Ur Screen: NOT DETECTED
Methadone Scn, Ur: NOT DETECTED
Opiate, Ur Screen: NOT DETECTED
Phencyclidine (PCP) Ur S: NOT DETECTED
Tricyclic, Ur Screen: POSITIVE — AB

## 2023-03-26 LAB — LIPID PANEL
Cholesterol: 164 mg/dL (ref 0–200)
HDL: 49 mg/dL (ref >40)
LDL Cholesterol: 101 mg/dL — ABNORMAL HIGH (ref 0–99)
Total CHOL/HDL Ratio: 3.3 RATIO
Triglycerides: 70 mg/dL (ref <150)
VLDL: 14 mg/dL (ref 0–40)

## 2023-03-26 LAB — ECHOCARDIOGRAM COMPLETE: Height: 69 in

## 2023-03-26 LAB — PROCALCITONIN: Procalcitonin: <0.1 ng/mL

## 2023-03-26 LAB — MRSA NEXT GEN BY PCR, NASAL: MRSA by PCR Next Gen: NOT DETECTED

## 2023-03-26 LAB — TROPONIN I (HIGH SENSITIVITY): Troponin I (High Sensitivity): 81 ng/L — ABNORMAL HIGH (ref <18)

## 2023-03-26 LAB — APTT: aPTT: 25 seconds (ref 24–36)

## 2023-03-26 MED ORDER — MELATONIN 5 MG PO TABS
5.0000 mg | ORAL_TABLET | Freq: Every day | ORAL | Status: DC
  Administered 2023-03-26 (×2): 5 mg via ORAL
  Filled 2023-03-26 (×2): qty 1

## 2023-03-26 MED ORDER — DIAZEPAM 5 MG PO TABS
10.0000 mg | ORAL_TABLET | Freq: Every evening | ORAL | Status: DC | PRN
  Administered 2023-03-26 (×2): 10 mg via ORAL
  Filled 2023-03-26 (×2): qty 2

## 2023-03-26 MED ORDER — BUTALBITAL-APAP-CAFFEINE 50-325-40 MG PO TABS
1.0000 | ORAL_TABLET | Freq: Four times a day (QID) | ORAL | Status: DC | PRN
  Administered 2023-03-26 – 2023-03-27 (×3): 1 via ORAL
  Filled 2023-03-26 (×3): qty 1

## 2023-03-26 MED ORDER — LOPERAMIDE HCL 2 MG PO CAPS: 2.0000 mg | ORAL_CAPSULE | Freq: Once | ORAL | Status: DC

## 2023-03-26 NOTE — Progress Notes (Signed)
PROGRESS NOTE    Grant Young  GNF:[id number] DOB: [birth year] DOA: 03/25/2023 PCP: Lorre Munroe, NP   Assessment & Plan:   Principal Problem:   Encephalopathy acute Active Problems:   Hypotestosteronism   Orthostasis   Hypertensive emergency  Assessment and Plan: Likely acute encephalopathy: etiology unclear, possibly secondary to urine drug screen positive for benzos, TCAs & barbiturates vs medication ADRs (testosterone vs anastrozole). Mental status is back to baseline. CT head shows no acute intracranial abnormalities. MRI brain ordered but unable to do b/c pt's spinal cord stimulator is not MRI compatible. Blood cxs ordered. Pro-cal <0.10   Stutter: intermittently x 2 weeks.Previously it resolved on its own. Unable to get MRI brain secondary to pt's spinal cord stimulator not being MRI compatible. Will need f/u outpatient w/ neuro for further evaluation   HTN emergency: emergency resolved but still w/ HTN. 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 given by some "integrative medicine DO" as per pt's wife     DVT prophylaxis: lovenox  Code Status: full  Family Communication: discussed pt's care w/ pt's wife at bedside and answered her questions  Disposition Plan: likely d/c back home   Level of care: Stepdown  Status is: Inpatient Remains inpatient appropriate because: severity of illness    Consultants:    Procedures:  Antimicrobials:   Subjective: Pt c/o headache  Objective: Vitals:   03/26/23 0645 03/26/23 0700 03/26/23 0715 03/26/23 0745  BP: (!) 157/85 (!) 153/89 (!) 151/96 (!) 177/95  Pulse: 82 87 92 92  Resp: 15 15 17 15   Temp:      TempSrc:      SpO2: 97% 97% 97% 97%  Weight:      Height:        Intake/Output Summary (Last 24 hours) at  03/26/2023 0842 Last data filed at 03/26/2023 0658 Gross per 24 hour  Intake 1362.31 ml  Output --  Net 1362.31 ml   Filed Weights   03/25/23 1942 03/25/23 2230  Weight: 63.5 kg 60.8 kg    Examination:  General exam: Appears calm and comfortable  Respiratory system: Clear to auscultation. Respiratory effort normal. Cardiovascular system: S1 & S2 heard, RRR. No JVD, murmurs, rubs, gallops or clicks. No pedal edema. Gastrointestinal system: Abdomen is nondistended, soft and nontender. No organomegaly or masses felt. Normal bowel sounds heard. Central nervous system: Alert and oriented. No focal neurological deficits. Extremities: Symmetric 5 x 5 power. Skin: No rashes, lesions or ulcers Psychiatry: Judgement and insight appear normal. Mood & affect appropriate.     Data Reviewed: I have personally reviewed following labs and imaging studies  CBC: Recent Labs  Lab 03/25/23 1950 03/25/23 2246 03/26/23 0629  WBC 7.5 7.1 6.9  HGB 17.0 17.7* 16.0  HCT 51.7 53.4* 47.8  MCV 90.9 88.7 89.2  PLT 216 219 213   Basic Metabolic Panel: Recent Labs  Lab 03/25/23 1950 03/25/23 2244 03/25/23 2246 03/26/23 0629  NA 140  --   --  143  K 3.4*  --   --  3.6  CL 102  --   --  111  CO2 27  --   --  24  GLUCOSE 103*  --   --  87  BUN 14  --   --  13  CREATININE 0.74  --  0.75 0.61  CALCIUM 9.3  --   --  9.0  MG  --  2.4  --   --    GFR: Estimated Creatinine Clearance: 69.7 mL/min (by C-G formula based on SCr of 0.61 mg/dL). Liver Function Tests: Recent Labs  Lab 03/25/23 1950  AST 25  ALT 22  ALKPHOS 128*  BILITOT 0.7  PROT 7.5  ALBUMIN 4.6   No results for input(s): "LIPASE", "AMYLASE" in the last 168 hours. No results for input(s): "AMMONIA" in the last 168 hours. Coagulation Profile: Recent Labs  Lab 03/26/23 0629  INR 1.0   Cardiac Enzymes: No results for input(s): "CKTOTAL", "CKMB", "CKMBINDEX", "TROPONINI" in the last 168 hours. BNP (last 3 results) No  results for input(s): "PROBNP" in the last 8760 hours. HbA1C: No results for input(s): "HGBA1C" in the last 72 hours. CBG: Recent Labs  Lab 03/25/23 2229  GLUCAP 105*   Lipid Profile: Recent Labs    03/26/23 0629  CHOL 164  HDL 49  LDLCALC 101*  TRIG 70  CHOLHDL 3.3   Thyroid Function Tests: No results for input(s): "TSH", "T4TOTAL", "FREET4", "T3FREE", "THYROIDAB" in the last 72 hours. Anemia Panel: No results for input(s): "VITAMINB12", "FOLATE", "FERRITIN", "TIBC", "IRON", "RETICCTPCT" in the last 72 hours. Sepsis Labs: No results for input(s): "PROCALCITON", "LATICACIDVEN" in the last 168 hours.  Recent Results (from the past 240 hour(s))  MRSA Next Gen by PCR, Nasal     Status: None   Collection Time: 03/25/23 10:29 PM   Specimen: Nasal Mucosa; Nasal Swab  Result Value Ref Range Status   MRSA by PCR Next Gen NOT DETECTED NOT DETECTED Final    Comment: (NOTE) The GeneXpert MRSA Assay (FDA approved for NASAL specimens only), is one component of a comprehensive MRSA colonization surveillance program. It is not intended to diagnose MRSA infection nor to guide or monitor treatment for MRSA infections. Test performance is not FDA approved in patients less than 89 years old. Performed at [facility], [address] [place], [place] [address]          Radiology Studies: CT HEAD WO CONTRAST  Result Date: 03/25/2023 CLINICAL DATA:  Syncope/presyncope, cerebrovascular cause suspected near syncope EXAM: CT HEAD WITHOUT CONTRAST TECHNIQUE: Contiguous axial images were obtained from the base of the skull through the vertex without intravenous contrast. RADIATION DOSE REDUCTION: This exam was performed according to the departmental dose-optimization program which includes automated exposure control, adjustment of the mA and/or kV according to patient size and/or use of iterative reconstruction technique. COMPARISON:  04/19/2022 FINDINGS: Brain: Old right frontal,  parietal and occipital infarcts, stable. There is atrophy and chronic small vessel disease changes. No acute intracranial abnormality. Specifically, no hemorrhage, hydrocephalus, mass lesion, acute infarction, or significant intracranial injury. Vascular: No hyperdense vessel or unexpected calcification. Skull: No acute calvarial abnormality. 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 Atrophy, chronic microvascular disease. No acute intracranial abnormality. Electronically Signed   By: Charlett Nose M.D.   On: 03/25/2023 20:04        Scheduled Meds:   stroke: early stages of recovery book   Does not apply Once   [START ON 03/31/2023] anastrozole  0.5 mg Oral Weekly   Chlorhexidine Gluconate Cloth  6 each Topical Daily   clopidogrel  75 mg Oral Daily   enoxaparin (LOVENOX) injection  40 mg Subcutaneous Q24H   hydrALAZINE  25 mg Oral TID   melatonin  5 mg Oral  QHS   pantoprazole  20 mg Oral Daily   propranolol ER  80 mg Oral Daily   pyridostigmine  30 mg Oral BID   sodium chloride flush  3 mL Intravenous Q12H   testosterone cypionate  80 mg Intramuscular Q14 Days   Continuous Infusions:  sodium chloride 75 mL/hr at 03/26/23 0658   niCARDipine Stopped (03/26/23 0024)     LOS: 1 day    Time spent: 35 mins     Charise Killian, MD [facility] Pager [phone]  If 7PM-7AM, please contact night-coverage [email or web address] 03/26/2023, 8:42 AM

## 2023-03-26 NOTE — Plan of Care (Signed)
Extensive teaching done for patient related to admission.  We had multiple conversations with wife and patient.

## 2023-03-26 NOTE — Progress Notes (Signed)
Pt transported to room 128 via wheelchair by NT. No telemetry per Dr. Richrd Humbles. Order d/c'd.

## 2023-03-26 NOTE — Evaluation (Addendum)
Speech Language Pathology Evaluation Patient Details Name: Grant Young MRN: [id number] DOB: [birth year] Today's Date: 03/26/2023 Time: 1415-1510 SLP Time Calculation (min) (ACUTE ONLY): 55 min  Problem List:  Patient Active Problem List   Diagnosis Date Noted   Encephalopathy acute 03/25/2023   Hypertensive emergency 03/25/2023   Statin myopathy 12/31/2022   History of cold sores 11/20/2022   Coronary artery disease involving native coronary artery of native heart without angina pectoris 08/18/2022   Chronic migraine without aura 11/28/2021   Restless leg syndrome 09/01/2021   Orthostasis 09/01/2021   OAB (overactive bladder) 12/03/2020   GERD (gastroesophageal reflux disease) 10/24/2020   Hypotestosteronism 10/24/2020   IBS (irritable bowel syndrome) 10/24/2020   Lyme disease 10/24/2020   Lumbar facet arthropathy 12/19/2019   Chronic pain syndrome 12/19/2019   Tremor 09/27/2019   Labile hypertension 09/21/2019   Anxiety and depression 12/21/2001   Hyperlipidemia LDL goal <70 09/30/2001   Polyneuropathy, unspecified 09/15/1999   Past Medical History:  Past Medical History:  Diagnosis Date   Anemia    Aneurysm of ascending aorta (HCC)    a.) CT chest 08/18/2022: 4.1 cm; b.) CT chest 12/07/2022: 4.2 cm   Anxiety    a.) on BZO (diazepam) PRN   Aortic atherosclerosis (HCC)    Chronic back pain    Coronary artery disease    a.) s/p PCI with DES x 2 (pLCx and o-mLAD) 11/27/2021   Depression    Erectile dysfunction    a.) Bi-mix (papaverine + phentolamine) injections + exogenous testosterone injections   GERD (gastroesophageal reflux disease)    h/o Lyme disease    Headache    Hiatal hernia    History of bilateral cataract extraction 01/2021   History of gastritis    History of kidney stones    History of neuropathy    Hyperlipidemia LDL goal <70    a.) CAD with NSTEMI -->  intolerant of statins with statin myopathy and memory issues.;  Also intolerant of Repatha    Hypertension    Labile blood pressures but dizziness with blood pressures in the "normal range"; intolerant of most medications including ARB's, ACE inhibitor's, amlodipine most beta-blockers other than propranolol.   Insomnia    a.) takes malatonin PRN   Left lower lobe pulmonary nodule    a.) chest CT 12/07/2022: nodules x 2 posteromedial LLL;  8 x 11 mm and 8 x 10 mm   Long term current use of antithrombotics/antiplatelets    a.) DAPT (ASA + clopidogrel)   Long term current use of aromatase inhibitor    a,) anastrozole --> estridiol suppression secondary to exogenous testosterone use   Lumbar radicular pain    Myocardial infarction (HCC)    a.) MI x 2 - 1990 * 1999 - PTCA   NSTEMI (non-ST elevated myocardial infarction) (HCC) 11/27/2021   a.) LHC 11/28/2021: sequential 75% o-pLAD, 70% OM2, 80% p-mLAD, 99% mLAD, 100% pLCx --> PCI placing a 2.5 x 26 mm Onyx Frontier DES to pLCx and a 2.5 x 24 mm Onyx Frontier DES to the o-m LAD (covering 3 lesions)   Overactive bladder    Pneumonia    PONV (postoperative nausea and vomiting)    Recurrent herpes labialis    a.) has suppressive valacyclovir to use PRN   Skin cancer, basal cell    Spinal cord stimulator status    01/08/21 - not currently using.   Squamous cell skin cancer    Substance abuse [facility])    Past Surgical  History:  Past Surgical History:  Procedure Laterality Date   BACK SURGERY     BASAL CELL CARCINOMA EXCISION     CATARACT EXTRACTION W/PHACO Left 01/14/2021   Procedure: CATARACT EXTRACTION PHACO AND INTRAOCULAR LENS PLACEMENT (IOC) LEFT VIVITY TORIC LENS 8.75 00:56.7;  Surgeon: Galen Manila, MD;  Location: [facility];  Service: Ophthalmology;  Laterality: Left;   CATARACT EXTRACTION W/PHACO Right 01/28/2021   Procedure: CATARACT EXTRACTION PHACO AND INTRAOCULAR LENS PLACEMENT (IOC) RIGHT VIVITY TORIC LENS;  Surgeon: Galen Manila, MD;  Location: [facility];  Service: Ophthalmology;  Laterality:  Right;  6.54 00:46.4   CHOLECYSTECTOMY  2017   COLONOSCOPY  09/15/2015   Per New Patient Packet   COLONOSCOPY WITH PROPOFOL N/A 10/03/2020   Procedure: COLONOSCOPY WITH PROPOFOL;  Surgeon: Wyline Mood, MD;  Location: [facility];  Service: Gastroenterology;  Laterality: N/A;   CORONARY/GRAFT ACUTE MI REVASCULARIZATION N/A 11/27/2021   Procedure: Coronary/Graft Acute MI Revascularization;  Surgeon: Marykay Lex, MD;  Location: [facility] CATH: (NSTEMI) - > 100% prox-mid LCx (Onyx Frontier DES 2.5 x 26 -> 2.7 mm, Ost  OM1 65%.  Ost-mid LAD 3 lesions 75%, 90%, 99% => DES PCI Onyx Frontier DES 2.5 x 34 -> 2.8 mm   ESOPHAGOGASTRODUODENOSCOPY (EGD) WITH PROPOFOL N/A 10/03/2020   Procedure: ESOPHAGOGASTRODUODENOSCOPY (EGD) WITH PROPOFOL;  Surgeon: Wyline Mood, MD;  Location: [facility] ENDOSCOPY;  Service: Gastroenterology;  Laterality: N/A;   FOOT SURGERY Bilateral 03/18/2020   FOOT SURGERY  08/032021   GALLBLADDER SURGERY  09/15/2015   Gallbladder Removal. Procedure done by Dr.Beverly. Per New Patient Packet   KIDNEY STONE SURGERY  09/14/1980   Too many to count. Per New Patient Packet 09/14/1980-09/15/1995   KIDNEY STONE SURGERY  09/15/1995   Too many to count. Per New Patient Packet   LEFT HEART CATH AND CORONARY ANGIOGRAPHY N/A 11/27/2021   Procedure: LEFT HEART CATH AND CORONARY ANGIOGRAPHY;  Surgeon: Marykay Lex, MD;  Location: [facility] CATH:  NSTEMI - 2 V CAD: 100% thrombotic prox-mid LCx (DES PCI), ost OM1 65%; Ost LAD 75% - prox LAD 90%, prox-mid 99% (DES PCI).  MIld diffuse RCA disease - calcicifed.  R-L collaterals filling LCx. EF ~45-50% with lateral HK. LVEDP 28 mmHg   LITHOTRIPSY  09/14/2014   Per New Patient Packet   LITHOTRIPSY  09/14/1996   No Stints Used. Per New Patient Packet   LITHOTRIPSY  09/14/1997   No Stints used. Per New Patient Packet   PAIN PUMP IMPLANTATION     PAIN PUMP REMOVAL     SHOULDER SURGERY Right 1984   SIGMOIDOSCOPY  09/14/2017   Per New Patient Packet    SPINAL CORD STIMULATOR IMPLANT     TONSILLECTOMY     TRANSTHORACIC ECHOCARDIOGRAM  11/28/2021   (in setting of non-STEMI): EF 50 to 55%.  Suspect inferior and lateral hypokinesis.  Indeterminate diastolic parameters.  Normal RV size and function.  Normal RAP.  Normal aortic and mitral valves with only mild MR and AI.  Normal RVP and RAP.   UPPER GI ENDOSCOPY     VIDEO BRONCHOSCOPY WITH ENDOBRONCHIAL ULTRASOUND N/A 01/15/2023   Procedure: VIDEO BRONCHOSCOPY WITH ENDOBRONCHIAL ULTRASOUND;  Surgeon: Vida Rigger, MD;  Location: [facility];  Service: Thoracic;  Laterality: N/A;   HPI:  Pt is a 77 y/o M admitted on 03/25/23 after presenting with c/o changes in speech & dizziness, leaning to the L; High Blood Pressure: 237/125.  Head CT was negative for acute issues; MRI has been delayed  2/2 his spinal cord stimulator.   PMH: Tremors in Upper and Lower extremities, Neuropathy, aneurysm of ascending aorta, Anxiety, chronic back pain, CAD, depression, GERD, lyme disease, HA, Hiatal Hernia, HTN, insomnia, MI, NSTEMI, overactive bladder, spinal cord stimulator, CVA, chronic orthostatic hypotension.   Pt arrived at the ED w/ c/o High BP, tremors, min change in his speech("stuttering"), and HA after exercising since 2pm.  Pt was exercising at [facility] at [facility] after prior exercising per pt report. Pt resides at [facility].    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  Atrophy, chronic microvascular disease.  No acute intracranial abnormality.    Assessment / Plan / Recommendation Clinical Impression   Pt was seen today for assessment of reported vocal quality changes, difficulty w/ speech ("Stuttering") at admit to the ED. Per MD note, pt's principle problem included High BP: 237/125 and is overall likely acute encephalopathy, "etiology unclear, possibly secondary to urine drug screen positive for benzos, TCAs & barbiturates vs medication ADRs (testosterone vs  anastrozole). Mental status is back to baseline. CT head shows no acute intracranial abnormalities. MRI brain ordered but unable to do b/c pt's spinal cord stimulator is not MRI compatible.". MD has ordered f/u Outpatient w/ Neurology for further evaluation post D/C. Pt has a PMH including Multiple medical issues.     Met w/ pt and Wife in room. Per informal assessment at bedside, pt's speech and vocal quality are functional and appropriate for conversational communication, at his Baseline(low volume and more monotone "most of my life", per pt/Wife report). The reported dysfluency, "stutter", has been intermittent x 2 weeks and has resolved on its own. Currently, Fluency of speech is appropriate and appears WFL; no overt Motor Speech deficits/decline in status noted during assessment. No expressive/receptive language deficits; no cognitive deficits noted. A/O x4.  During lengthy conversation, vocal tremors nor dysfluency ("stuttering") was noted; no impact on conversational speech during session. NSG denied this as well today. Pt communicated in conversation and speech tasks w/ appropriate articulation, resonance, and rate. Pt has a Baseline of min lower volume and monotone pitch per pt/Wife report "for many years". Wife stated she "mutes the TV if we have a conversation during a show".  Pt stated he felt his speech was at his Baseline at this visit today.   Discussed at length about his Exercising and what he did that day requiring hospitalization; also the impact of his High BP issues -- he stated he felt he over-did the Exercise programs ("extra pool time"). Discussed that these issues and when he pushed his body past the point of Exhaustion (he described he could not stand up getting out of the pool), suspect it could have affected ALL of his muscles not just his legs -- even his vocal cord movement/coordination during speech.  W/ return to his Baseline, further acute skilled ST services are not  indicated. Could consider Outpatient ENT consult for further f/u for assessment of vocal cords and Neurology for assessment/determination of his Baseline Muscle Tremors for any impact on vocal cord movement/coordination (as the tremors impact U/LEs more significantly now per pt report).  MD/NSG updated, agreed. Recommended pt Pace himself during Exercising and f/u w/ his PCP and program coordinator for Supervision; discussed vocal rest when feeling fatigued post exercising and/or at end of day. Pt and Wife agreed.   SLP Assessment  SLP Recommendation/Assessment: All further Speech Lanaguage Pathology  needs can be addressed in the next venue of  care (if voice therapy is sought for support POST ENT CONSULT) SLP Visit Diagnosis: Aphonia (R49.1) (vocal cord tremors reported at admit - resolved now)    Recommendations for follow up therapy are one component of a multi-disciplinary discharge planning process, led by the attending physician.  Recommendations may be updated based on patient status, additional functional criteria and insurance authorization.    Follow Up Recommendations  Follow physician's recommendations for discharge plan and follow up therapies    Assistance Recommended at Discharge  PRN  Functional Status Assessment Patient has had a recent decline in their functional status and demonstrates the ability to make significant improvements in function in a reasonable and predictable amount of time.  Frequency and Duration  (n/a)   (n/a)      SLP Evaluation Cognition  Overall Cognitive Status: Within Functional Limits for tasks assessed Arousal/Alertness: Awake/alert Orientation Level: Oriented X4 Attention: Focused;Sustained Focused Attention: Appears intact Sustained Attention: Appears intact Memory: Appears intact Awareness: Appears intact Problem Solving: Appears intact Executive Function: Reasoning Reasoning: Appears intact Behaviors:  (n/a) Safety/Judgment: Appears  intact Comments: pt verbalized insight into his exercise routine and its impact on his body by end of session       Comprehension  Auditory Comprehension Overall Auditory Comprehension: Appears within functional limits for tasks assessed Visual Recognition/Discrimination Discrimination: Not tested Reading Comprehension Reading Status: Not tested    Expression Expression Primary Mode of Expression: Verbal Verbal Expression Overall Verbal Expression: Appears within functional limits for tasks assessed Written Expression Dominant Hand: Right Written Expression: Not tested   Oral / Motor  Oral Motor/Sensory Function Overall Oral Motor/Sensory Function: Within functional limits Motor Speech Overall Motor Speech: Appears within functional limits for tasks assessed Respiration: Within functional limits Phonation: Normal (min lower volume -- pt and Wife stated this is Baseline for him) Resonance: Within functional limits (min monotone pitch - pt and Wife stated Baseline for him) Articulation: Within functional limitis Intelligibility: Intelligible Motor Planning: Witnin functional limits Motor Speech Errors: Not applicable Interfering Components: Premorbid status Effective Techniques:  (n/a)              Jerilynn Som, MS, CCC-SLP Speech Language Pathologist Rehab Services; [facility] [place] [phone] (ascom) Sherol Sabas 03/26/2023, 3:53 PM

## 2023-03-26 NOTE — Plan of Care (Signed)
  Problem: Education: Goal: Knowledge of disease or condition will improve Outcome: Progressing Goal: Knowledge of secondary prevention will improve (MUST DOCUMENT ALL) Outcome: Progressing Goal: Knowledge of patient specific risk factors will improve Grant Young N/A or DELETE if not current risk factor) Outcome: Progressing   Problem: Health Behavior/Discharge Planning: Goal: Ability to manage health-related needs will improve Outcome: Progressing   Problem: Self-Care: Goal: Ability to participate in self-care as condition permits will improve Outcome: Progressing   Problem: Nutrition: Goal: Risk of aspiration will decrease Outcome: Progressing Goal: Dietary intake will improve Outcome: Progressing   Problem: Education: Goal: Knowledge of secondary prevention will improve (MUST DOCUMENT ALL) Outcome: Progressing Goal: Knowledge of patient specific risk factors will improve Grant Young N/A or DELETE if not current risk factor) Outcome: Progressing   Problem: Education: Goal: Knowledge of disease or condition will improve Outcome: Adequate for Discharge

## 2023-03-26 NOTE — Evaluation (Signed)
Occupational Therapy Evaluation Patient Details Name: Grant Young MRN: [id number] DOB: 28-Sep-1945 Today's Date: 03/26/2023   History of Present Illness Pt is a 77 y/o M admitted on 03/25/23 after presenting with c/o changes in speech & dizziness, leaning to the L. Head CT was negative for acute issues, MRI has been delayed 2/2 spinal cord stimulator. PMH: aneurysm of ascending aorta, anxiety, chronic back pain, CAD, depression, GERD, lyme disease, HA, hiatal hernia, neuropathy, HTN, insomnia, MI, NSTEMI, overactive bladder, spinal cord stimulator, CVA, chronic orthostatic hypotension   Clinical Impression   Patient presenting with decreased Ind in self care,balance, functional mobility/transfers, endurance, and safety awareness. Patient reports being Ind at baseline and active. Patient initially standing and ambulating while pushing IV pole like a shopping cart with min guard but notes narrow base of support. Pt ambulating 100' in hallway without AD with min guard - min A. Pt fatiguing quite a bit at the end and beginning to drag L LE and needed additional cuing for safety. Decreased speed and coordination in L UE during session as well. Pt reports blurred vision throughout session for the last several days.  Patient will benefit from acute OT to increase overall independence in the areas of ADLs, functional mobility, and safety awareness  in order to safely discharge.     Recommendations for follow up therapy are one component of a multi-disciplinary discharge planning process, led by the attending physician.  Recommendations may be updated based on patient status, additional functional criteria and insurance authorization.   Assistance Recommended at Discharge Intermittent Supervision/Assistance  Patient can return home with the following A little help with walking and/or transfers;A little help with bathing/dressing/bathroom;Assistance with cooking/housework;Assist for transportation;Help with  stairs or ramp for entrance    Functional Status Assessment  Patient has had a recent decline in their functional status and demonstrates the ability to make significant improvements in function in a reasonable and predictable amount of time.  Equipment Recommendations  None recommended by OT       Precautions / Restrictions Precautions Precautions: Fall      Mobility Bed Mobility               General bed mobility comments: seated in recliner chair    Transfers Overall transfer level: Needs assistance Equipment used: None Transfers: Sit to/from Stand Sit to Stand: Min guard                  Balance Overall balance assessment: Needs assistance Sitting-balance support: Feet supported, No upper extremity supported Sitting balance-Leahy Scale: Good     Standing balance support: Bilateral upper extremity supported, No upper extremity supported Standing balance-Leahy Scale: Fair                             ADL either performed or assessed with clinical judgement   ADL Overall ADL's : Needs assistance/impaired                                       General ADL Comments: supervision - min A for functional moblity and self care needs.     Vision Baseline Vision/History: 1 Wears glasses Patient Visual Report: Blurring of vision Vision Assessment?: Yes Ocular Range of Motion: Within Functional Limits Additional Comments: testing difficult as pt's eyelids closed quite a bit with testing making it difficult to determine. Pt unable  to converge during testing.            Pertinent Vitals/Pain Pain Assessment Pain Assessment: 0-10 Pain Score: 5  Pain Location: frontal HA, neck Pain Descriptors / Indicators: Headache Pain Intervention(s): Monitored during session     Hand Dominance Right   Extremity/Trunk Assessment Upper Extremity Assessment Upper Extremity Assessment: Generalized weakness;LUE deficits/detail LUE  Coordination: decreased fine motor   Lower Extremity Assessment Lower Extremity Assessment: Generalized weakness       Communication Communication Communication: No difficulties   Cognition Arousal/Alertness: Awake/alert Behavior During Therapy: WFL for tasks assessed/performed Overall Cognitive Status: Within Functional Limits for tasks assessed                                                  Home Living Family/patient expects to be discharged to:: Private residence (house at [facility]) Living Arrangements: Spouse/significant other Available Help at Discharge: Family Type of Home: House Home Access: Stairs to enter Secretary/administrator of Steps: 1 Entrance Stairs-Rails: Right Home Layout: One level     Bathroom Shower/Tub: Producer, television/film/video: Handicapped height     Home Equipment: Agricultural consultant (2 wheels)   Additional Comments: Resides at [employer] [facility]      Prior Functioning/Environment Prior Level of Function : Independent/Modified Independent             Mobility Comments: Pt reports he's independent without AD but does have RW at his home & used it day of admission. Pt notes hx of peripheral neuropathy, tremors in extremities that have worsened over the past 1-2 months. Pt notes 2 falls in the past 6 months. ADLs Comments: Pt is very active and exercises (water aerobics). Ind in self care and IADLs.        OT Problem List: Decreased strength;Decreased coordination;Decreased activity tolerance;Decreased safety awareness;Impaired balance (sitting and/or standing);Decreased knowledge of use of DME or AE;Impaired vision/perception      OT Treatment/Interventions: Self-care/ADL training;Therapeutic exercise;Therapeutic activities;Energy conservation;DME and/or AE instruction;Balance training;Patient/family education;Visual/perceptual remediation/compensation;Neuromuscular education    OT Goals(Current goals  can be found in the care plan section) Acute Rehab OT Goals Patient Stated Goal: to return to PLOF and home OT Goal Formulation: With patient/family Time For Goal Achievement: 04/09/23 Potential to Achieve Goals: Fair  OT Frequency: Min 1X/week       AM-PAC OT "6 Clicks" Daily Activity     Outcome Measure Help from another person eating meals?: None Help from another person taking care of personal grooming?: A Little Help from another person toileting, which includes using toliet, bedpan, or urinal?: A Little Help from another person bathing (including washing, rinsing, drying)?: A Little Help from another person to put on and taking off regular upper body clothing?: A Little Help from another person to put on and taking off regular lower body clothing?: A Little 6 Click Score: 19   End of Session Nurse Communication: Mobility status  Activity Tolerance: Patient tolerated treatment well Patient left: in chair;with call bell/phone within reach;with chair alarm set;with family/visitor present  OT Visit Diagnosis: Unsteadiness on feet (R26.81);Muscle weakness (generalized) (M62.81);History of falling (Z91.81)                Time: 0102-7253 OT Time Calculation (min): 29 min Charges:  OT General Charges $OT Visit: 1 Visit OT Evaluation $OT Eval Moderate  Complexity: 1 Mod OT Treatments $Neuromuscular Re-education: 8-22 mins  Jackquline Denmark, MS, OTR/L , CBIS ascom [phone]  03/26/23, 1:17 PM

## 2023-03-26 NOTE — Evaluation (Addendum)
Physical Therapy Evaluation Patient Details Name: Grant Young MRN: [id number] DOB: 30-Apr-1946 Today's Date: 03/26/2023  History of Present Illness  Pt is a 77 y/o M admitted on 03/25/23 after presenting with c/o changes in speech & dizziness, leaning to the L. Head CT was negative for acute issues, MRI has been delayed 2/2 spinal cord stimulator. PMH: aneurysm of ascending aorta, anxiety, chronic back pain, CAD, depression, GERD, lyme disease, HA, hiatal hernia, neuropathy, HTN, insomnia, MI, NSTEMI, overactive bladder, spinal cord stimulator, CVA, chronic orthostatic hypotension  Clinical Impression  Pt seen for PT evaluation with pt agreeable. Pt reports prior to admission he was independent without AD, residing with wife in [facility] community. On this date, pt is able to ambulate without AD with min assist fade to CGA, RW with supervision. Pt with impaired gait pattern as noted below. Pt with tremulous movements in extremities that he reports have worsened over the past 1-2 months. Will continue to follow pt acutely to address balance, endurance, gait & stair negotiation to reduce fall risk.        Assistance Recommended at Discharge Frequent or constant Supervision/Assistance  If plan is discharge home, recommend the following:  Can travel by private vehicle  A little help with bathing/dressing/bathroom;A little help with walking and/or transfers;Assistance with cooking/housework;Assist for transportation;Help with stairs or ramp for entrance        Equipment Recommendations None recommended by PT (pt already has RW)  Recommendations for Other Services       Functional Status Assessment Patient has had a recent decline in their functional status and demonstrates the ability to make significant improvements in function in a reasonable and predictable amount of time.     Precautions / Restrictions Precautions Precautions: Fall Restrictions Weight Bearing Restrictions: No       Mobility  Bed Mobility Overal bed mobility: Modified Independent, Needs Assistance Bed Mobility: Supine to Sit     Supine to sit: Modified independent (Device/Increase time), HOB elevated     General bed mobility comments: extra time    Transfers Overall transfer level: Needs assistance Equipment used: None, Rolling walker (2 wheels) Transfers: Sit to/from Stand Sit to Stand: Min guard, Supervision           General transfer comment: STS from EOB without AD with CGA, STS from recliner with RW with supervision    Ambulation/Gait Ambulation/Gait assistance: Min guard, Min assist, Supervision Gait Distance (Feet): 80 Feet (+ 80 ft) Assistive device: None, Rolling walker (2 wheels) Gait Pattern/deviations: Decreased step length - right, Decreased step length - left, Decreased stride length, Decreased dorsiflexion - right, Decreased dorsiflexion - left Gait velocity: decreased     General Gait Details: decreased heel strike BLE. Pt ambulates first trial without AD with min assist fade to CGA, 2nd trial with RW & supervision. PT provides cuing but poor return demo for increased step length BLE.  Stairs            Wheelchair Mobility     Tilt Bed    Modified Rankin (Stroke Patients Only)       Balance Overall balance assessment: Needs assistance   Sitting balance-Leahy Scale: Fair     Standing balance support: During functional activity, No upper extremity supported Standing balance-Leahy Scale: Poor                               Pertinent Vitals/Pain Pain Assessment Pain Assessment: 0-10 Pain  Score: 5  Pain Location: frontal HA, neck Pain Descriptors / Indicators: Headache Pain Intervention(s): Monitored during session (notified nurse who gave meds, notified MD)    Home Living Family/patient expects to be discharged to:: Private residence [facility]) Living Arrangements: with wife Available Help at Discharge: Family Type  of Home: House Home Access: Stairs to enter Entrance Stairs-Rails: Right (per chart) Entrance Stairs-Number of Steps: 1   Home Layout: One level Home Equipment: Agricultural consultant (2 wheels)      Prior Function Prior Level of Function : Independent/Modified Independent             Mobility Comments: Pt reports he's independent without AD but does have RW at his home & used it day of admission. Pt notes hx of peripheral neuropathy, tremors in extremities that have worsened over the past 1-2 months. Pt notes 2 falls in the past 6 months.       Hand Dominance   Dominant Hand: Right    Extremity/Trunk Assessment   Upper Extremity Assessment Upper Extremity Assessment: Generalized weakness (BUE/BLE tremors)    Lower Extremity Assessment Lower Extremity Assessment: Generalized weakness (BUE/BLE tremors)       Communication   Communication: No difficulties  Cognition Arousal/Alertness: Awake/alert Behavior During Therapy: WFL for tasks assessed/performed Overall Cognitive Status: Within Functional Limits for tasks assessed                                 General Comments: delayed processing        General Comments      Exercises     Assessment/Plan    PT Assessment Patient needs continued PT services  PT Problem List Decreased strength;Pain;Decreased activity tolerance;Decreased balance;Decreased mobility;Decreased knowledge of use of DME       PT Treatment Interventions DME instruction;Therapeutic exercise;Balance training;Stair training;Neuromuscular re-education;Gait training;Functional mobility training;Therapeutic activities;Patient/family education    PT Goals (Current goals can be found in the Care Plan section)  Acute Rehab PT Goals Patient Stated Goal: feel better, get better PT Goal Formulation: With patient Time For Goal Achievement: 04/09/23 Potential to Achieve Goals: Good    Frequency Min 1X/week     Co-evaluation                AM-PAC PT "6 Clicks" Mobility  Outcome Measure Help needed turning from your back to your side while in a flat bed without using bedrails?: None Help needed moving from lying on your back to sitting on the side of a flat bed without using bedrails?: A Little Help needed moving to and from a bed to a chair (including a wheelchair)?: A Little Help needed standing up from a chair using your arms (e.g., wheelchair or bedside chair)?: A Little Help needed to walk in hospital room?: A Little Help needed climbing 3-5 steps with a railing? : A Little 6 Click Score: 19    End of Session   Activity Tolerance: Patient tolerated treatment well Patient left: in chair;with call bell/phone within reach Nurse Communication: Mobility status PT Visit Diagnosis: Unsteadiness on feet (R26.81);Muscle weakness (generalized) (M62.81);Difficulty in walking, not elsewhere classified (R26.2)    Time: 4098-1191 PT Time Calculation (min) (ACUTE ONLY): 26 min   Charges:   PT Evaluation $PT Eval Moderate Complexity: 1 Mod   PT General Charges $$ ACUTE PT VISIT: 1 Visit         Aleda Grana, PT, DPT 03/26/23, 12:25 PM  Sandi Mariscal 03/26/2023, 9:53 AM

## 2023-03-26 NOTE — Progress Notes (Signed)
Technologist is evaluating the compatibility of leads with patient MRI conditional stimulator. OP notes state lead type but looking at the MRI safety packet for the stimulator leads are not safe. Technologist will confirm eligibility in AM when [employer] is open.

## 2023-03-26 NOTE — Plan of Care (Signed)
  Problem: Education: Goal: Knowledge of disease or condition will improve Outcome: Progressing Goal: Knowledge of secondary prevention will improve (MUST DOCUMENT ALL) Outcome: Progressing Goal: Knowledge of patient specific risk factors will improve Loraine Leriche N/A or DELETE if not current risk factor) Outcome: Progressing   Problem: Ischemic Stroke/TIA Tissue Perfusion: Goal: Complications of ischemic stroke/TIA will be minimized Outcome: Progressing   Problem: Coping: Goal: Will verbalize positive feelings about self Outcome: Progressing Goal: Will identify appropriate support needs Outcome: Progressing   Problem: Health Behavior/Discharge Planning: Goal: Ability to manage health-related needs will improve Outcome: Progressing Goal: Goals will be collaboratively established with patient/family Outcome: Progressing   Problem: Self-Care: Goal: Ability to participate in self-care as condition permits will improve Outcome: Progressing Goal: Verbalization of feelings and concerns over difficulty with self-care will improve Outcome: Progressing Goal: Ability to communicate needs accurately will improve Outcome: Progressing   Problem: Nutrition: Goal: Risk of aspiration will decrease Outcome: Progressing   Problem: Coping: Goal: Will verbalize positive feelings about self Outcome: Progressing

## 2023-03-26 NOTE — TOC Initial Note (Addendum)
Transition of Care [facility]) - Initial/Assessment Note    Patient Details  Name: Grant Young MRN: [id number] Date of Birth: 09-30-1945  Transition of Care [facility]) CM/SW Contact:    Kreg Shropshire, RN Phone Number: 03/26/2023, 1:34 PM  Clinical Narrative:                 Cm assessed for TOC needs. PT recommends HH PT. Pt arrived from [facility] Independent living. Cm called to verify If pt can come back. She stated yes. Whenever he is d/c they will need FL2 form. Cm asked wife and pt if they have a preference for [facility]. They stated they want therapy with [facility] Cm confirmed that [facility] has therapy. There therapy usually starts 1 week after d/c according to therapy department.  [facility] Department number is [phone]. There fax number is [phone].  [facility] Nurse Ninetta Lights [phone]  Rehab department stated that they are not Home Health PT but outpatient/ambulatory when sending referrals.   Wife transports pt to appointments and pt uses walker prn. Pt also has PCP.  Cm will continue to follow for TOC needs.  Expected Discharge Plan: Assisted Living Barriers to Discharge: Continued Medical Work up   Patient Goals and CMS Choice     Choice offered to / list presented to : Patient      Expected Discharge Plan and Services     Post Acute Care Choice: Durable Medical Equipment Living arrangements for the past 2 months: Assisted Living Facility                                      Prior Living Arrangements/Services Living arrangements for the past 2 months: Assisted Living Facility Lives with:: Self Patient language and need for interpreter reviewed:: Yes        Need for Family Participation in Patient Care: Yes (Comment) Care giver support system in place?: Yes (comment) Current home services: DME    Activities of Daily Living Home Assistive Devices/Equipment: None ADL Screening (condition at time of admission) Patient's cognitive ability  adequate to safely complete daily activities?: Yes Is the patient deaf or have difficulty hearing?: No Does the patient have difficulty seeing, even when wearing glasses/contacts?: No Does the patient have difficulty concentrating, remembering, or making decisions?: No Patient able to express need for assistance with ADLs?: Yes Does the patient have difficulty dressing or bathing?: No Independently performs ADLs?: Yes (appropriate for developmental age) Does the patient have difficulty walking or climbing stairs?: No Weakness of Legs: None Weakness of Arms/Hands: None  Permission Sought/Granted         Permission granted to share info w AGENCY: [employer] granted to share info w Relationship: Wife     Emotional Assessment Appearance:: Appears stated age   Affect (typically observed): Accepting Orientation: : Oriented to Self, Oriented to Place, Oriented to  Time, Oriented to Situation   Psych Involvement: Yes (comment)  Admission diagnosis:  Hypertensive encephalopathy [I67.4] Encephalopathy acute [G93.40] Patient Active Problem List   Diagnosis Date Noted   Encephalopathy acute 03/25/2023   Hypertensive emergency 03/25/2023   Statin myopathy 12/31/2022   History of cold sores 11/20/2022   Coronary artery disease involving native coronary artery of native heart without angina pectoris 08/18/2022   Chronic migraine without aura 11/28/2021   Restless leg syndrome 09/01/2021   Orthostasis 09/01/2021   OAB (overactive  bladder) 12/03/2020   GERD (gastroesophageal reflux disease) 10/24/2020   Hypotestosteronism 10/24/2020   IBS (irritable bowel syndrome) 10/24/2020   Lyme disease 10/24/2020   Lumbar facet arthropathy 12/19/2019   Chronic pain syndrome 12/19/2019   Tremor 09/27/2019   Labile hypertension 09/21/2019   Anxiety and depression 12/21/2001   Hyperlipidemia LDL goal <70 09/30/2001   Polyneuropathy, unspecified 09/15/1999   PCP:  Lorre Munroe, NP Pharmacy:   [id number] [place], [place] - [address] AT [facility] [address] [facility] [address] [place] [address] Phone: [phone] Fax: [phone]  [id number] - [place], [place] - [address] [place] [address] Phone: [phone] Fax: [phone]     Social Determinants of Health (SDOH) Social History: SDOH Screenings   Food Insecurity: No Food Insecurity (03/25/2023)  Housing: Low Risk  (03/25/2023)  Transportation Needs: No Transportation Needs (03/25/2023)  Utilities: Not At Risk (03/25/2023)  Alcohol Screen: Low Risk  (12/03/2022)  Depression (PHQ2-9): Low Risk  (02/23/2023)  Financial Resource Strain: Low Risk  (12/03/2022)  Physical Activity: Insufficiently Active (12/03/2022)  Social Connections: Moderately Integrated (12/03/2022)  Stress: No Stress Concern Present (12/03/2022)  Tobacco Use: Low Risk  (03/25/2023)   SDOH Interventions:     Readmission Risk Interventions     No data to display

## 2023-03-26 NOTE — Telephone Encounter (Signed)
PA denied.

## 2023-03-27 ENCOUNTER — Inpatient Hospital Stay (HOSPITAL_COMMUNITY)
Admit: 2023-03-27 | Discharge: 2023-03-27 | Disposition: A | Discharge status: Home / Self Care | Payer: Medicare Other | Attending: Internal Medicine | Admitting: Internal Medicine

## 2023-03-27 DIAGNOSIS — G9340 Encephalopathy, unspecified: Secondary | ICD-10-CM | POA: Diagnosis not present

## 2023-03-27 DIAGNOSIS — I161 Hypertensive emergency: Secondary | ICD-10-CM

## 2023-03-27 LAB — BASIC METABOLIC PANEL
Anion gap: 5 (ref 5–15)
BUN: 11 mg/dL (ref 8–23)
CO2: 23 mmol/L (ref 22–32)
Calcium: 8.7 mg/dL — ABNORMAL LOW (ref 8.9–10.3)
Chloride: 112 mmol/L — ABNORMAL HIGH (ref 98–111)
Creatinine, Ser: 0.66 mg/dL (ref 0.61–1.24)
GFR, Estimated: >60 mL/min (ref >60)
Glucose, Bld: 98 mg/dL (ref 70–99)
Potassium: 3.1 mmol/L — ABNORMAL LOW (ref 3.5–5.1)
Sodium: 140 mmol/L (ref 135–145)

## 2023-03-27 LAB — HEMOGLOBIN A1C
Hgb A1c MFr Bld: 5.2 % (ref 4.8–5.6)
Mean Plasma Glucose: 103 mg/dL

## 2023-03-27 MED ORDER — POTASSIUM CHLORIDE CRYS ER 20 MEQ PO TBCR
40.0000 meq | EXTENDED_RELEASE_TABLET | Freq: Two times a day (BID) | ORAL | Status: DC
  Administered 2023-03-27: 40 meq via ORAL
  Filled 2023-03-27: qty 2

## 2023-03-27 NOTE — Progress Notes (Signed)
Mobility Specialist - Progress Note   03/27/23 1118  Mobility  Activity Ambulated with assistance in hallway  Level of Assistance Standby assist, set-up cues, supervision of patient - no hands on  Assistive Device Front wheel walker  Distance Ambulated (ft) 300 ft  Activity Response Tolerated well  Mobility Referral Yes  $Mobility charge 1 Mobility  Mobility Specialist Start Time (ACUTE ONLY) 1051  Mobility Specialist Stop Time (ACUTE ONLY) 1113  Mobility Specialist Time Calculation (min) (ACUTE ONLY) 22 min   Pt sitting in recliner on RA upon arrival. Pt dons shoes indep. Pt STS and ambulates in hallway SBA with no LOB noted. Pt returns to recliner with needs in reach.   Terrilyn Saver  Mobility Specialist  03/27/23 11:21 AM

## 2023-03-27 NOTE — Progress Notes (Signed)
Mobility Specialist - Progress Note   03/27/23 0852  Mobility  Activity Ambulated with assistance in room;Stood at bedside;Dangled on edge of bed  Level of Assistance Contact guard assist, steadying assist  Assistive Device Front wheel walker  Distance Ambulated (ft) 5 ft  Activity Response Tolerated well  Mobility Referral Yes  $Mobility charge 1 Mobility  Mobility Specialist Start Time (ACUTE ONLY) [id number]  Mobility Specialist Stop Time (ACUTE ONLY) 0841  Mobility Specialist Time Calculation (min) (ACUTE ONLY) 22 min   Pt supine in bed on RA upon arrival. Pt STS and ambulates from bed to chair CGA-SBA with no LOB noted. Pt left in recliner with needs in reach and chair alarm activated.   Terrilyn Saver  Mobility Specialist  03/27/23 8:53 AM

## 2023-03-27 NOTE — Progress Notes (Signed)
MD order received in [facility] to discharge pt home today; verbally reviewed AVS with pt, no questions voiced at this time; pt verbalized that he wants to eat lunch before he leaves; verbally instructed pt to call when he is finished eating in order for staff to get a wheelchair for discharge

## 2023-03-27 NOTE — Discharge Summary (Signed)
Physician Discharge Summary  Grant Young ZOX:[id number] DOB: 02-11-1946 DOA: 03/25/2023  PCP: Lorre Munroe, NP  Admit date: 03/25/2023 Discharge date: 03/27/2023  Admitted From: home  Disposition:  home  Recommendations for Outpatient Follow-up:  Follow up with PCP in 1 week F/u w/ neuro in 1 week   Home Health: No, pt did not want home health  Equipment/Devices:  Discharge Condition: stable  CODE STATUS: full  Diet recommendation: Heart Healthy   Brief/Interim Summary: HPI was taken from Dr. Maryjean Ka: Grant Young is a 77 y.o. male with medical history significant of residence in the [place] elderly community.  Patient participated in pool aerobic activities at approximately 2 PM, got tired there as he usually does and subsequently had a shower and then went to [facility] at approximately 4 PM.  By all accounts patient was feeling at his baseline when he went to [facility] at 4 PM.  As he was stepping out of there, patient reports of feeling "pulled "to the left side.  This is corroborated by his wife at the bedside who noted that the patient was leaning to the left.  Anticipating a fall, patient sat down on a chair.  Subsequently felt much better and was able to get back up and go home.  Unfortunately at home patient was noted to have a stutter and his speech.  This is new and that patient has had 1 prior episode several weeks ago of stuttering speech that was very transient and resolved on its own.  However this 1 was persistent.  There is no report of patient having any weakness in any extremity patient does not report vertigo.  No vision changes.  Patient contacted primary care provider over the phone, took blood pressure at home which was found to be markedly elevated.  Patient is brought to the ER.   ER course is notable for persistent stuttering of speech.  Elevated blood pressure, patient has been started on a nicardipine infusion  Discharge Diagnoses:  Principal Problem:   Encephalopathy  acute Active Problems:   Hypotestosteronism   Orthostasis   Hypertensive emergency  Likely acute encephalopathy: etiology unclear, possibly secondary to urine drug screen positive for benzos, TCAs & barbiturates vs medication ADRs (testosterone vs anastrozole). Mental status is back to baseline. 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 Atrophy, chronic microvascular disease . MRI brain ordered but unable to do b/c pt's spinal cord stimulator is not MRI compatible. Blood cxs NGTD. Pro-cal <0.10   Stutter: intermittently x 2 weeks.Previously it resolved on its own. Unable to get MRI brain secondary to pt's spinal cord stimulator not being MRI compatible. Will need f/u outpatient w/ neuro for further evaluation as per neuro inpatient curbside   HTN emergency: emergency resolved but still w/ HTN. 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 F/u w/ [facility] neurology in 1 week.   Increase activity slowly   Complete by: As directed  Allergies as of 03/27/2023       Reactions   Ace Inhibitors    Other reaction(s): Cough   Fluoxetine Anxiety   "made me fall asleep" per pt "bad headaches and "makes  Me  Crazy" historical allergy noted in [employer] "made me fall asleep" per pt "bad headaches and "makes  Me  Crazy" Per New Patient Packet.    Metoclopramide    Other reaction(s): Other (See Comments), Other (See Comments), Unknown Tardive Dyskinesia  historical allergy  noted in [employer] Tardive Dyskinesia  Per New Patient Packet.   Nalbuphine    Used Post Back surgery- Anesthesiologist Error. Patient had Narcotic Withdraw. Per New Patient Packet.    Other    Other reaction(s): Other (See Comments) Altered mental status in combo with narcotics at previous hospitalization - Full Withdrawal Symptoms Other reaction(s): Rash   Amoxicillin-pot Clavulanate Nausea Only   Per New Patient Packet.   Doxazosin Rash   Other reaction(s): Other - See Comments, Rash UNKNOWN REACTION UNKNOWN REACTION   Duloxetine Nausea Only   Per New Patient Packet.    Penicillins Nausea Only   Per New Patient Packet.   Tamsulosin Itching, Anxiety   Restless, Flushing, Heavy Chest, Itching, Hyperactive mood and Anxiety. Unable to handle side effects. Per New Patient Packet.    Trazodone And Nefazodone Itching, Anxiety, Rash   Headache. "INCREASED MY ANXIETY AND HEARTRATE" Flushing, tachycardia "INCREASED MY ANXIETY AND HEARTRATE" Per New Patient Packet.   Amlodipine    Shaking, unsure of reaction. Per New Patient Packet.    Cinoxacin    GI Intolerance, and Dizziness. Per New Patient Packet.    Ciprofloxacin    Other reaction(s): Unknown   Fludrocortisone Other (See Comments)   "Worsening headaches, GI issues, fatigue"   Nebivolol    Other reaction(s): Unknown   Olanzapine    Headache and unable to sleep for 3 nights. Per New Patient Packet.   Olmesartan    Other reaction(s): Unknown   Pregabalin    Confusion, Lack of concentration, dizziness, and likely drowsiness. Per New Patient Packet.    Prostaglandins    Other reaction(s): Other (See Comments) Intolerance   Thyroid Hormones    Other reaction(s): Other (See Comments) Thyroid (Nature Thyroid) contraindicated with some of your other medications.   Zolpidem    Nightmares, Ineffective after 2 days. Per New Patient Packet.    Duloxetine Hcl    Other reaction(s): Rash   Fluoxetine Hcl    Other reaction(s): Rash    Nucynta [tapentadol] Other (See Comments)   Vertigo    Phenytoin Anxiety   Hyperactivity, and Ineffective. Per New Patient Packet.         Medication List     TAKE these medications    anastrozole 1 MG tablet Commonly known as: ARIMIDEX 1/2 tablet (0.5 mg) by mouth once weekly   cetirizine 10 MG tablet Commonly known as: ZYRTEC Take 10 mg by mouth daily as needed for allergies.   Cholecalciferol 50 MCG (2000 UT) Caps Take 1 tablet by mouth daily.   clopidogrel 75 MG tablet Commonly known as: PLAVIX TAKE 1 TABLET BY MOUTH ONCE DAILY.   DHEA 25 MG Caps Take 25 mg by mouth daily.   diazepam 5 MG tablet Commonly known as: VALIUM TAKE 1 TABLET IN THE MORNING AND 2 TABLETS AT BEDTIME   dicyclomine 10 MG capsule Commonly known as: BENTYL Take 1 capsule (10 mg total) by mouth 4 (four) times daily -  before meals and at  bedtime.   DIGESTION GB PO Take 1 capsule by mouth daily.   ezetimibe 10 MG tablet Commonly known as: ZETIA TAKE 1 TABLET BY MOUTH EVERY DAY   ferrous sulfate 325 (65 FE) MG tablet Take 325 mg by mouth. Once daily on Mondays, Wednesdays and Fridays   hydrALAZINE 25 MG tablet Commonly known as: APRESOLINE Take 25 mg by mouth 3 (three) times daily.   IMODIUM PO Take by mouth as needed.   ipratropium 0.06 % nasal spray Commonly known as: ATROVENT Place 1 spray into both nostrils 4 (four) times daily as needed for rhinitis. For up to 5-7 days then stop.   MAGNESIUM BISGLYCINATE PO Take by mouth daily.   melatonin 5 MG Tabs Take 5 mg by mouth at bedtime.   NONFORMULARY OR COMPOUNDED ITEM Bi-Mix Papaverine 30mg , Phentolamine 1mg    Dosage: Inject 1cc per injection    Vial 1ml   Qty #5 Refills 6   nortriptyline 10 MG capsule Commonly known as: PAMELOR Take 10 mg in the morning and 30 mg at night and continue that dose   NUTRITIONAL SUPPLEMENT PO Take 1 capsule by mouth daily. [name]   polyethylene glycol 17 g  packet Commonly known as: MIRALAX / GLYCOLAX Take 17 g by mouth daily as needed.   POTASSIUM PO Take 1 tablet by mouth as directed. Takes 5x weekly.   PROBIOTIC DAILY PO Take by mouth daily.   prochlorperazine 10 MG tablet Commonly known as: COMPAZINE Take 1 tablet (10 mg total) by mouth 2 (two) times daily as needed for nausea or vomiting.   propranolol ER 80 MG 24 hr capsule Commonly known as: Inderal LA Take 1 capsule (80 mg total) by mouth daily.   pyridostigmine 60 MG tablet Commonly known as: MESTINON Take 0.5 tablets (30 mg total) by mouth 2 (two) times daily.   RABEprazole 20 MG tablet Commonly known as: ACIPHEX TAKE 1 TABLET BY MOUTH TWICE A DAY   sucralfate 1 g tablet Commonly known as: CARAFATE Take 1 tablet (1 g total) by mouth 4 (four) times daily as needed.   testosterone cypionate 200 MG/ML injection Commonly known as: DEPOTESTOSTERONE CYPIONATE Inject 80 mg into the muscle every 14 (fourteen) days.   valACYclovir 1000 MG tablet Commonly known as: VALTREX Take 2 tabs p.o. and repeat in 12 hours as needed for cold sore   venlafaxine XR 75 MG 24 hr capsule Commonly known as: Effexor XR Take 1 capsule (75 mg total) by mouth daily with breakfast. What changed:  when to take this additional instructions   Vitamin B-12 5000 MCG Lozg Take 1 lozenge by mouth daily.        Follow-up Information     Antony Madura, MD Follow up.   Specialty: Neurology Why: F/u in 1 week Contact information: [address] [place] [address] [phone]                Allergies  Allergen Reactions   Ace Inhibitors     Other reaction(s): Cough   Fluoxetine Anxiety    "made me fall asleep" per pt "bad headaches and "makes  Me  Crazy" historical allergy noted in [employer] "made me fall asleep" per pt "bad headaches and "makes  Me  Crazy" Per New Patient Packet.     Metoclopramide     Other reaction(s): Other (See Comments), Other (See  Comments), Unknown Tardive Dyskinesia  historical allergy noted in [employer] Tardive Dyskinesia  Per New  Patient Packet.   Nalbuphine     Used Post Back surgery- Anesthesiologist Error. Patient had Narcotic Withdraw. Per New Patient Packet.    Other     Other reaction(s): Other (See Comments) Altered mental status in combo with narcotics at previous hospitalization - Full Withdrawal Symptoms Other reaction(s): Rash   Amoxicillin-Pot Clavulanate Nausea Only    Per New Patient Packet.   Doxazosin Rash    Other reaction(s): Other - See Comments, Rash UNKNOWN REACTION UNKNOWN REACTION    Duloxetine Nausea Only    Per New Patient Packet.    Penicillins Nausea Only    Per New Patient Packet.   Tamsulosin Itching and Anxiety    Restless, Flushing, Heavy Chest, Itching, Hyperactive mood and Anxiety. Unable to handle side effects. Per New Patient Packet.     Trazodone And Nefazodone Itching, Anxiety and Rash    Headache. "INCREASED MY ANXIETY AND HEARTRATE" Flushing, tachycardia "INCREASED MY ANXIETY AND HEARTRATE" Per New Patient Packet.    Amlodipine     Shaking, unsure of reaction. Per New Patient Packet.     Cinoxacin     GI Intolerance, and Dizziness. Per New Patient Packet.    Ciprofloxacin     Other reaction(s): Unknown   Fludrocortisone Other (See Comments)    "Worsening headaches, GI issues, fatigue"   Nebivolol     Other reaction(s): Unknown   Olanzapine     Headache and unable to sleep for 3 nights. Per New Patient Packet.    Olmesartan     Other reaction(s): Unknown   Pregabalin     Confusion, Lack of concentration, dizziness, and likely drowsiness. Per New Patient Packet.     Prostaglandins     Other reaction(s): Other (See Comments) Intolerance   Thyroid Hormones     Other reaction(s): Other (See Comments) Thyroid (Nature Thyroid) contraindicated with some of your other medications.   Zolpidem     Nightmares, Ineffective after 2 days. Per New Patient  Packet.    Duloxetine Hcl     Other reaction(s): Rash   Fluoxetine Hcl     Other reaction(s): Rash   Nucynta [Tapentadol] Other (See Comments)    Vertigo    Phenytoin Anxiety    Hyperactivity, and Ineffective. Per New Patient Packet.     Consultations:    Procedures/Studies: CT HEAD WO CONTRAST  Result Date: 03/25/2023 CLINICAL DATA:  Syncope/presyncope, cerebrovascular cause suspected near syncope EXAM: CT HEAD WITHOUT CONTRAST TECHNIQUE: Contiguous axial images were obtained from the base of the skull through the vertex without intravenous contrast. RADIATION DOSE REDUCTION: This exam was performed according to the departmental dose-optimization program which includes automated exposure control, adjustment of the mA and/or kV according to patient size and/or use of iterative reconstruction technique. COMPARISON:  04/19/2022 FINDINGS: Brain: Old right frontal, parietal and occipital infarcts, stable. There is atrophy and chronic small vessel disease changes. No acute intracranial abnormality. Specifically, no hemorrhage, hydrocephalus, mass lesion, acute infarction, or significant intracranial injury. Vascular: No hyperdense vessel or unexpected calcification. Skull: No acute calvarial abnormality. 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 Atrophy, chronic microvascular disease. No acute intracranial abnormality. Electronically Signed   By: Charlett Nose M.D.   On: 03/25/2023 20:04   (Echo, Carotid, EGD, Colonoscopy, ERCP)    Subjective: Pt c/o fatigue    Discharge Exam: Vitals:   03/27/23 0723 03/27/23 1125  BP: (!) 171/96 (!) 152/92  Pulse: 81 86  Resp: 18 16  Temp: 97.8 F (36.6 C) (!) 97.5 F (36.4 C)  SpO2: 99% 99%   Vitals:   03/27/23 0111 03/27/23 0333 03/27/23 0723 03/27/23 1125  BP: (!) 146/80 (!) 143/88 (!) 171/96 (!) 152/92  Pulse: 85 91 81 86  Resp: 18 18 18 16   Temp: 98.1 F (36.7 C) 97.6  F (36.4 C) 97.8 F (36.6 C) (!) 97.5 F (36.4 C)  TempSrc: Oral  Oral Oral  SpO2: 93% 100% 99% 99%  Weight:      Height:        General: Pt is alert, awake, not in acute distress Cardiovascular:  S1/S2 +, no rubs, no gallops Respiratory: CTA bilaterally, no wheezing, no rhonchi Abdominal: Soft, NT, ND, bowel sounds + Extremities: no edema, no cyanosis    The results of significant diagnostics from this hospitalization (including imaging, microbiology, ancillary and laboratory) are listed below for reference.     Microbiology: Recent Results (from the past 240 hour(s))  MRSA Next Gen by PCR, Nasal     Status: None   Collection Time: 03/25/23 10:29 PM   Specimen: Nasal Mucosa; Nasal Swab  Result Value Ref Range Status   MRSA by PCR Next Gen NOT DETECTED NOT DETECTED Final    Comment: (NOTE) The GeneXpert MRSA Assay (FDA approved for NASAL specimens only), is one component of a comprehensive MRSA colonization surveillance program. It is not intended to diagnose MRSA infection nor to guide or monitor treatment for MRSA infections. Test performance is not FDA approved in patients less than 73 years old. Performed at [facility], [address] [place], [place] [address]   Culture, blood (Routine X 2) w Reflex to ID Panel     Status: None (Preliminary result)   Collection Time: 03/26/23  9:59 AM   Specimen: BLOOD  Result Value Ref Range Status   Specimen Description BLOOD BLOOD RIGHT HAND  Final   Special Requests   Final    BOTTLES DRAWN AEROBIC AND ANAEROBIC Blood Culture adequate volume   Culture   Final    NO GROWTH < 24 HOURS Performed at [facility], [address] [place], [place] [address]    Report Status PENDING  Incomplete  Culture, blood (Routine X 2) w Reflex to ID Panel     Status: None (Preliminary result)   Collection Time: 03/26/23  9:59 AM   Specimen: BLOOD  Result Value Ref Range Status   Specimen Description BLOOD RIGHT  ANTECUBITAL  Final   Special Requests   Final    BOTTLES DRAWN AEROBIC AND ANAEROBIC Blood Culture adequate volume   Culture   Final    NO GROWTH < 24 HOURS Performed at [facility], [address] [place], [place] [address]    Report Status PENDING  Incomplete     Labs: BNP (last 3 results) Recent Labs    04/19/22 0952  BNP 149.5*   Basic Metabolic Panel: Recent Labs  Lab 03/25/23 1950 03/25/23 2244 03/25/23 2246 03/26/23 0629 03/27/23 0503  NA 140  --   --  143 140  K 3.4*  --   --  3.6 3.1*  CL 102  --   --  111 112*  CO2 27  --   --  24 23  GLUCOSE 103*  --   --  87 98  BUN 14  --   --  13 11  CREATININE 0.74  --  0.75 0.61 0.66  CALCIUM 9.3  --   --  9.0 8.7*  MG  --  2.4  --   --   --    Liver Function Tests: Recent Labs  Lab 03/25/23 1950  AST 25  ALT 22  ALKPHOS 128*  BILITOT 0.7  PROT 7.5  ALBUMIN 4.6   No results for input(s): "LIPASE", "AMYLASE" in the last 168 hours. No results for input(s): "AMMONIA" in the last 168 hours. CBC: Recent Labs  Lab 03/25/23 1950 03/25/23 2246 03/26/23 0629  WBC 7.5 7.1 6.9  HGB 17.0 17.7* 16.0  HCT 51.7 53.4* 47.8  MCV 90.9 88.7 89.2  PLT 216 219 213   Cardiac Enzymes: No results for input(s): "CKTOTAL", "CKMB", "CKMBINDEX", "TROPONINI" in the last 168 hours. BNP: Invalid input(s): "POCBNP" CBG: Recent Labs  Lab 03/25/23 2229  GLUCAP 105*   D-Dimer No results for input(s): "DDIMER" in the last 72 hours. Hgb A1c Recent Labs    03/25/23 2244  HGBA1C 5.2   Lipid Profile Recent Labs    03/26/23 0629  CHOL 164  HDL 49  LDLCALC 101*  TRIG 70  CHOLHDL 3.3   Thyroid function studies No results for input(s): "TSH", "T4TOTAL", "T3FREE", "THYROIDAB" in the last 72 hours.  Invalid input(s): "FREET3" Anemia work up No results for input(s): "VITAMINB12", "FOLATE", "FERRITIN", "TIBC", "IRON", "RETICCTPCT" in the last 72 hours. Urinalysis    Component Value Date/Time   COLORURINE  STRAW (A) 03/25/2023 1950   APPEARANCEUR CLEAR (A) 03/25/2023 1950   APPEARANCEUR Clear 10/14/2020 1417   LABSPEC 1.005 03/25/2023 1950   PHURINE 7.0 03/25/2023 1950   GLUCOSEU NEGATIVE 03/25/2023 1950   HGBUR NEGATIVE 03/25/2023 1950   BILIRUBINUR NEGATIVE 03/25/2023 1950   BILIRUBINUR negative 03/13/2022 0952   BILIRUBINUR Negative 10/14/2020 1417   KETONESUR 5 (A) 03/25/2023 1950   PROTEINUR NEGATIVE 03/25/2023 1950   UROBILINOGEN 0.2 03/13/2022 0952   NITRITE NEGATIVE 03/25/2023 1950   LEUKOCYTESUR NEGATIVE 03/25/2023 1950   Sepsis Labs Recent Labs  Lab 03/25/23 1950 03/25/23 2246 03/26/23 0629  WBC 7.5 7.1 6.9   Microbiology Recent Results (from the past 240 hour(s))  MRSA Next Gen by PCR, Nasal     Status: None   Collection Time: 03/25/23 10:29 PM   Specimen: Nasal Mucosa; Nasal Swab  Result Value Ref Range Status   MRSA by PCR Next Gen NOT DETECTED NOT DETECTED Final    Comment: (NOTE) The GeneXpert MRSA Assay (FDA approved for NASAL specimens only), is one component of a comprehensive MRSA colonization surveillance program. It is not intended to diagnose MRSA infection nor to guide or monitor treatment for MRSA infections. Test performance is not FDA approved in patients less than 34 years old. Performed at [facility], [address] [place], [place] [address]   Culture, blood (Routine X 2) w Reflex to ID Panel     Status: None (Preliminary result)   Collection Time: 03/26/23  9:59 AM   Specimen: BLOOD  Result Value Ref Range Status   Specimen Description BLOOD BLOOD RIGHT HAND  Final   Special Requests   Final    BOTTLES DRAWN AEROBIC AND ANAEROBIC Blood Culture adequate volume   Culture   Final    NO GROWTH < 24 HOURS Performed at [facility], [address] [place], [place] [address]    Report Status PENDING  Incomplete  Culture, blood (Routine X 2) w Reflex to ID Panel     Status: None (Preliminary result)   Collection Time:  03/26/23  9:59 AM  Specimen: BLOOD  Result Value Ref Range Status   Specimen Description BLOOD RIGHT ANTECUBITAL  Final   Special Requests   Final    BOTTLES DRAWN AEROBIC AND ANAEROBIC Blood Culture adequate volume   Culture   Final    NO GROWTH < 24 HOURS Performed at [facility], [address] [place], [place] [address]    Report Status PENDING  Incomplete     Time coordinating discharge: Over 30 minutes  SIGNED:   Charise Killian, MD  [facility] 03/27/2023, 12:22 PM Pager   If 7PM-7AM, please contact night-coverage [email or web address]

## 2023-03-27 NOTE — Plan of Care (Signed)
Problem: Education: Goal: Knowledge of disease or condition will improve Outcome: Adequate for Discharge Goal: Knowledge of secondary prevention will improve (MUST DOCUMENT ALL) Outcome: Adequate for Discharge Goal: Knowledge of patient specific risk factors will improve Grant Young N/A or DELETE if not current risk factor) Outcome: Adequate for Discharge   Problem: Ischemic Stroke/TIA Tissue Perfusion: Goal: Complications of ischemic stroke/TIA will be minimized Outcome: Adequate for Discharge   Problem: Coping: Goal: Will verbalize positive feelings about self Outcome: Adequate for Discharge Goal: Will identify appropriate support needs Outcome: Adequate for Discharge   Problem: Health Behavior/Discharge Planning: Goal: Ability to manage health-related needs will improve Outcome: Adequate for Discharge Goal: Goals will be collaboratively established with patient/family Outcome: Adequate for Discharge   Problem: Self-Care: Goal: Ability to participate in self-care as condition permits will improve Outcome: Adequate for Discharge Goal: Verbalization of feelings and concerns over difficulty with self-care will improve Outcome: Adequate for Discharge Goal: Ability to communicate needs accurately will improve Outcome: Adequate for Discharge   Problem: Nutrition: Goal: Risk of aspiration will decrease 03/27/2023 1256 by Garwin Brothers, RN Outcome: Adequate for Discharge 03/27/2023 1210 by Garwin Brothers, RN Outcome: Progressing Goal: Dietary intake will improve Outcome: Adequate for Discharge   Problem: Education: Goal: Knowledge of General Education information will improve Description: Including pain rating scale, medication(s)/side effects and non-pharmacologic comfort measures Outcome: Adequate for Discharge   Problem: Health Behavior/Discharge Planning: Goal: Ability to manage health-related needs will improve Outcome: Adequate for Discharge   Problem: Clinical  Measurements: Goal: Ability to maintain clinical measurements within normal limits will improve Outcome: Adequate for Discharge Goal: Will remain free from infection Outcome: Adequate for Discharge Goal: Diagnostic test results will improve Outcome: Adequate for Discharge Goal: Respiratory complications will improve Outcome: Adequate for Discharge Goal: Cardiovascular complication will be avoided Outcome: Adequate for Discharge   Problem: Activity: Goal: Risk for activity intolerance will decrease 03/27/2023 1256 by Garwin Brothers, RN Outcome: Adequate for Discharge 03/27/2023 1210 by Garwin Brothers, RN Outcome: Progressing   Problem: Nutrition: Goal: Adequate nutrition will be maintained Outcome: Adequate for Discharge   Problem: Coping: Goal: Level of anxiety will decrease Outcome: Adequate for Discharge   Problem: Elimination: Goal: Will not experience complications related to bowel motility Outcome: Adequate for Discharge Goal: Will not experience complications related to urinary retention Outcome: Adequate for Discharge   Problem: Pain Managment: Goal: General experience of comfort will improve 03/27/2023 1256 by Garwin Brothers, RN Outcome: Adequate for Discharge 03/27/2023 1210 by Garwin Brothers, RN Outcome: Progressing   Problem: Safety: Goal: Ability to remain free from injury will improve 03/27/2023 1256 by Garwin Brothers, RN Outcome: Adequate for Discharge 03/27/2023 1210 by Garwin Brothers, RN Outcome: Progressing   Problem: Skin Integrity: Goal: Risk for impaired skin integrity will decrease 03/27/2023 1256 by Garwin Brothers, RN Outcome: Adequate for Discharge 03/27/2023 1210 by Garwin Brothers, RN Outcome: Progressing   Problem: Education: Goal: Knowledge of disease or condition will improve Outcome: Adequate for Discharge Goal: Knowledge of secondary prevention will improve (MUST DOCUMENT ALL) Outcome: Adequate for  Discharge Goal: Knowledge of patient specific risk factors will improve Grant Young N/A or DELETE if not current risk factor) Outcome: Adequate for Discharge   Problem: Ischemic Stroke/TIA Tissue Perfusion: Goal: Complications of ischemic stroke/TIA will be minimized Outcome: Adequate for Discharge   Problem: Coping: Goal: Will verbalize positive feelings about self Outcome: Adequate for Discharge Goal: Will identify appropriate support needs Outcome: Adequate for Discharge  Problem: Health Behavior/Discharge Planning: Goal: Ability to manage health-related needs will improve Outcome: Adequate for Discharge Goal: Goals will be collaboratively established with patient/family Outcome: Adequate for Discharge   Problem: Self-Care: Goal: Ability to participate in self-care as condition permits will improve Outcome: Adequate for Discharge Goal: Verbalization of feelings and concerns over difficulty with self-care will improve Outcome: Adequate for Discharge Goal: Ability to communicate needs accurately will improve Outcome: Adequate for Discharge   Problem: Nutrition: Goal: Risk of aspiration will decrease Outcome: Adequate for Discharge Goal: Dietary intake will improve Outcome: Adequate for Discharge

## 2023-03-27 NOTE — Progress Notes (Signed)
  Echocardiogram 2D Echocardiogram has been performed.  Lenor Coffin 03/27/2023, 5:03 PM

## 2023-03-27 NOTE — Progress Notes (Signed)
Pt finished eating lunch and ready to go; pt discharged via wheelchair by nursing [facility]

## 2023-03-27 NOTE — Plan of Care (Signed)
  Problem: Nutrition: Goal: Risk of aspiration will decrease Outcome: Progressing   Problem: Activity: Goal: Risk for activity intolerance will decrease Outcome: Progressing Patient ambulated with a rolling walker in the hallway with mobility tech this shift Problem: Pain Managment: Goal: General experience of comfort will improve Outcome: Progressing   Problem: Safety: Goal: Ability to remain free from injury will improve Outcome: Progressing   Problem: Skin Integrity: Goal: Risk for impaired skin integrity will decrease Outcome: Progressing

## 2023-03-28 LAB — ECHOCARDIOGRAM COMPLETE
AR max vel: 2.18 cm2
AV Peak grad: 4.7 mmHg
Ao pk vel: 1.08 m/s
Area-P 1/2: 4.68 cm2
Calc EF: 48.1 %
P 1/2 time: 487 msec
S' Lateral: 3.7 cm
Single Plane A2C EF: 50.9 %
Single Plane A4C EF: 50.5 %
Weight: 2144.63 oz

## 2023-03-28 LAB — THYROID PANEL WITH TSH
Free Thyroxine Index: 2.7 (ref 1.2–4.9)
T3 Uptake Ratio: 26 % (ref 24–39)
T4, Total: 10.5 ug/dL (ref 4.5–12.0)
TSH: 3.23 u[IU]/mL (ref 0.450–4.500)

## 2023-03-28 LAB — DHEA-SULFATE: DHEA-SO4: 197 ug/dL (ref 30.9–295.6)

## 2023-03-29 ENCOUNTER — Ambulatory Visit: Payer: Self-pay | Admitting: *Deleted

## 2023-03-29 ENCOUNTER — Telehealth: Payer: Self-pay | Admitting: *Deleted

## 2023-03-29 LAB — CULTURE, BLOOD (ROUTINE X 2)
Culture: NO GROWTH
Culture: NO GROWTH
Special Requests: ADEQUATE

## 2023-03-29 MED ORDER — PRALUENT 75 MG/ML ~~LOC~~ SOAJ: 75.0000 mg | SUBCUTANEOUS | 2 refills | Status: DC

## 2023-03-29 NOTE — Addendum Note (Signed)
Addended by: Tylene Fantasia on: 03/29/2023 01:25 PM   Modules accepted: Orders

## 2023-03-29 NOTE — Telephone Encounter (Signed)
, (  UPDATE) Pharmacy Patient Advocate Encounter  Received notification from [facility] that Prior Authorization for Praluent has been APPROVED from 1.1.24 to 7.12.25..   (See Approval letter in pts Chart)

## 2023-03-29 NOTE — Telephone Encounter (Signed)
Call to inform, N/A. Sent MyChart Msg.

## 2023-03-29 NOTE — Chronic Care Management (AMB) (Signed)
   03/29/2023  DAY DEERY June 25, 1946 [id number]   Patient not currently participating in chronic care management services (CCM), status changed to previously enrolled.  Irving Shows [facility], BSN RN Case Manager [phone]

## 2023-03-29 NOTE — Transitions of Care (Post Inpatient/ED Visit) (Signed)
   03/29/2023  Name: Grant Young MRN: [id number] DOB: 01/31/1946  Today's TOC FU Call Status: Today's TOC FU Call Status:: Unsuccessul Call (1st Attempt) Unsuccessful Call (1st Attempt) Date: 03/29/23  Attempted to reach the patient regarding the most recent Inpatient/ED visit.  Follow Up Plan: Additional outreach attempts will be made to reach the patient to complete the Transitions of Care (Post Inpatient/ED visit) call.   Gean Maidens BSN RN [facility] [phone]

## 2023-03-30 ENCOUNTER — Telehealth: Payer: Self-pay | Admitting: *Deleted

## 2023-03-30 LAB — CULTURE, BLOOD (ROUTINE X 2)

## 2023-03-30 NOTE — Transitions of Care (Post Inpatient/ED Visit) (Signed)
03/30/2023  Name: Grant Young  Today's TOC FU Call Status: Today's TOC FU Call Status:: Successful TOC FU Call Competed TOC FU Call Complete Date: 03/30/23  Transition Care Management Follow-up Telephone Call Date of Discharge: 03/27/23 Discharge Facility: [name] Type of Discharge: Inpatient Admission Primary Inpatient Discharge Diagnosis:: Encephalopathy acute How have you been since you were released from the hospital?: Better (My blood pressure is better but it is still elevated)  Items Reviewed: Did you receive and understand the discharge instructions provided?: Yes Medications obtained,verified, and reconciled?: Yes (Medications Reviewed) Any new allergies since your discharge?: No Dietary orders reviewed?: No Do you have support at home?: Yes People in Home: spouse Name of Support/Comfort Primary Source: Bette  Medications Reviewed Today: Medications Reviewed Today     Reviewed by Luella Cook, RN (Case Manager) on 03/30/23 at 1153  Med List Status: <None>   Medication Order Taking? Sig Documenting Provider Last Dose Status Informant  Alirocumab (PRALUENT) 75 MG/ML SOAJ [id number] Yes Inject 1 mL (75 mg total) into the skin every 14 (fourteen) days. Marykay Lex, MD Taking Active   anastrozole (ARIMIDEX) 1 MG tablet [id number] Yes 1/2 tablet (0.5 mg) by mouth once weekly [provider] Taking Active Spouse/Significant Other, Self  cetirizine (ZYRTEC) 10 MG tablet [id number] Yes Take 10 mg by mouth daily as needed for allergies. [provider] Taking Active Spouse/Significant Other, Self  Cholecalciferol 50 MCG (2000 UT) CAPS [id number] Yes Take 1 tablet by mouth daily. [provider] Taking Active Spouse/Significant Other, Self  clopidogrel (PLAVIX) 75 MG tablet [id number] Yes TAKE 1 TABLET BY MOUTH ONCE DAILY. Marykay Lex, MD Taking Active   Cyanocobalamin (VITAMIN  B-12) 5000 MCG LOZG [id number] Yes Take 1 lozenge by mouth daily.  [provider] Taking Active Spouse/Significant Other, Self  DHEA 25 MG CAPS [id number] Yes Take 25 mg by mouth daily. [provider] Taking Active Spouse/Significant Other, Self  diazepam (VALIUM) 5 MG tablet [id number] Yes TAKE 1 TABLET IN THE MORNING AND 2 TABLETS AT BEDTIME Lorre Munroe, NP Taking Active   dicyclomine (BENTYL) 10 MG capsule [id number] Yes Take 1 capsule (10 mg total) by mouth 4 (four) times daily -  before meals and at bedtime. Lorre Munroe, NP Taking Active Self, Spouse/Significant Other           Med Note Ernest Mallick, MINDY L   Thu Dec 31, 2022  9:44 AM)    Digestive Aids Mixture (DIGESTION GB PO) [id number] Yes Take 1 capsule by mouth daily. [provider] Taking Active Spouse/Significant Other, Self  ezetimibe (ZETIA) 10 MG tablet [id number] Yes TAKE 1 TABLET BY MOUTH EVERY DAY [name], Salvadore Oxford, NP Taking Active   ferrous sulfate 325 (65 FE) MG tablet [id number] Yes Take 325 mg by mouth. Once daily on Mondays, Wednesdays and Fridays [provider] Taking Active Self, Spouse/Significant Other  hydrALAZINE (APRESOLINE) 25 MG tablet [id number] Yes Take 25 mg by mouth 3 (three) times daily. [provider] Taking Active            Med Note Gerald Dexter Mar 25, 2023  9:22 PM) Not listed on history, but reports taking this medication today 03/25/23  ipratropium (ATROVENT) 0.06 % nasal spray [id number] Yes Place 1 spray into both nostrils 4 (four) times daily as needed for rhinitis. For up to 5-7 days then stop. Smitty Cords, DO Taking Active Self, Spouse/Significant  Other  Loperamide HCl (IMODIUM PO) [id number] Yes Take by mouth as needed.  [provider] Taking Active Spouse/Significant Other, Self  MAGNESIUM BISGLYCINATE PO [id number] Yes Take by mouth daily. [provider] Taking Active Spouse/Significant Other, Self  melatonin 5 MG  TABS [id number] Yes Take 5 mg by mouth at bedtime. [provider] Taking Active Spouse/Significant Other, Self  NONFORMULARY OR COMPOUNDED ITEM [id number] Yes Bi-Mix Papaverine 30mg , Phentolamine 1mg    Dosage: Inject 1cc per injection    Vial 1ml   Qty #5 Refills 6 Sondra Come, MD Taking Active Self, Spouse/Significant Other  nortriptyline (PAMELOR) 10 MG capsule [id number] Yes Take 10 mg in the morning and 30 mg at night and continue that dose [provider] Taking Active            Med Note Gerald Dexter Mar 25, 2023  9:26 PM) Pt taking 50 mg at bedtime until 04/02/23 per MD  Nutritional Supplements (NUTRITIONAL SUPPLEMENT PO) [id number] Yes Take 1 capsule by mouth daily. [name] [provider] Taking Active Spouse/Significant Other, Self  polyethylene glycol (MIRALAX / GLYCOLAX) 17 g packet [id number] Yes Take 17 g by mouth daily as needed. [provider] Taking Active Spouse/Significant Other, Self  POTASSIUM PO [id number] Yes Take 1 tablet by mouth as directed. Takes 5x weekly. [provider] Taking Active   Probiotic Product (PROBIOTIC DAILY PO) [id number] Yes Take by mouth daily. [provider] Taking Active Spouse/Significant Other, Self  prochlorperazine (COMPAZINE) 10 MG tablet [id number] Yes Take 1 tablet (10 mg total) by mouth 2 (two) times daily as needed for nausea or vomiting. Lorre Munroe, NP Taking Active Self, Spouse/Significant Other  propranolol ER (INDERAL LA) 80 MG 24 hr capsule [id number] Yes Take 1 capsule (80 mg total) by mouth daily. Marykay Lex, MD Taking Active   pyridostigmine (MESTINON) 60 MG tablet [id number] Yes Take 0.5 tablets (30 mg total) by mouth 2 (two) times daily. Lorre Munroe, NP Taking Active Self, Spouse/Significant Other  RABEprazole (ACIPHEX) 20 MG tablet [id number] Yes TAKE 1 TABLET BY MOUTH TWICE A DAY [name], Salvadore Oxford, NP Taking Active Self, Spouse/Significant  Other  sucralfate (CARAFATE) 1 g tablet [id number] Yes Take 1 tablet (1 g total) by mouth 4 (four) times daily as needed. Lorre Munroe, NP Taking Active   testosterone cypionate (DEPOTESTOSTERONE CYPIONATE) 200 MG/ML injection [id number] Yes Inject 80 mg into the muscle every 14 (fourteen) days. [provider] Taking Active Spouse/Significant Other, Self  valACYclovir (VALTREX) 1000 MG tablet [id number] Yes Take 2 tabs p.o. and repeat in 12 hours as needed for cold sore Lorre Munroe, NP Taking Active Spouse/Significant Other, Self  venlafaxine XR (EFFEXOR XR) 75 MG 24 hr capsule [id number] Yes Take 1 capsule (75 mg total) by mouth daily with breakfast.  Patient taking differently: Take 75 mg by mouth daily. Take with supper - 5pm   Lorre Munroe, NP Taking Active Self  Med List Note Vernie Ammons, RN 12/29/22 1343): MR 01/28/23 or prn  UDS 09-17-20            Home Care and Equipment/Supplies: Were Home Health Services Ordered?: NA Any new equipment or medical supplies ordered?: NA  Functional Questionnaire: Do you need assistance with bathing/showering or dressing?: No Do you need assistance with meal preparation?: Yes Do you need assistance with eating?: No Do you have difficulty maintaining continence: No Do you need assistance with getting out of  bed/getting out of a chair/moving?: No Do you have difficulty managing or taking your medications?: No  Follow up appointments reviewed: PCP Follow-up appointment confirmed?: Yes Date of PCP follow-up appointment?: 03/31/23 Follow-up Provider: [facility] Follow-up appointment confirmed?: Yes Date of Specialist follow-up appointment?: 05/06/23 Follow-Up Specialty Provider:: Dr Herbie Baltimore Do you need transportation to your follow-up appointment?: No Do you understand care options if your condition(s) worsen?: Yes-patient verbalized understanding  SDOH Interventions Today    Flowsheet Row Most  Recent Value  SDOH Interventions   Food Insecurity Interventions Intervention Not Indicated  Housing Interventions Intervention Not Indicated  Transportation Interventions Intervention Not Indicated, Patient Resources (Friends/Family)      Interventions Today    Flowsheet Row Most Recent Value  General Interventions   General Interventions Discussed/Reviewed General Interventions Discussed, General Interventions Reviewed, Doctor Visits  Pharmacy Interventions   Pharmacy Dicussed/Reviewed Pharmacy Topics Discussed      TOC Interventions Today    Flowsheet Row Most Recent Value  TOC Interventions   TOC Interventions Discussed/Reviewed TOC Interventions Discussed, TOC Interventions Reviewed       Gean Maidens BSN RN [facility] [phone]

## 2023-03-31 ENCOUNTER — Ambulatory Visit (INDEPENDENT_AMBULATORY_CARE_PROVIDER_SITE_OTHER): Payer: Medicare Other | Admitting: Internal Medicine

## 2023-03-31 ENCOUNTER — Telehealth: Payer: Self-pay

## 2023-03-31 ENCOUNTER — Encounter: Payer: Self-pay | Admitting: Internal Medicine

## 2023-03-31 VITALS — BP 136/84 | HR 78 | Temp 96.6°F | Wt 141.0 lb

## 2023-03-31 DIAGNOSIS — R0989 Other specified symptoms and signs involving the circulatory and respiratory systems: Secondary | ICD-10-CM | POA: Diagnosis not present

## 2023-03-31 DIAGNOSIS — G2581 Restless legs syndrome: Secondary | ICD-10-CM

## 2023-03-31 DIAGNOSIS — I161 Hypertensive emergency: Secondary | ICD-10-CM

## 2023-03-31 DIAGNOSIS — G9340 Encephalopathy, unspecified: Secondary | ICD-10-CM | POA: Diagnosis not present

## 2023-03-31 DIAGNOSIS — G629 Polyneuropathy, unspecified: Secondary | ICD-10-CM

## 2023-03-31 DIAGNOSIS — F8081 Childhood onset fluency disorder: Secondary | ICD-10-CM

## 2023-03-31 DIAGNOSIS — G43719 Chronic migraine without aura, intractable, without status migrainosus: Secondary | ICD-10-CM

## 2023-03-31 DIAGNOSIS — R251 Tremor, unspecified: Secondary | ICD-10-CM

## 2023-03-31 LAB — CULTURE, BLOOD (ROUTINE X 2): Special Requests: ADEQUATE

## 2023-03-31 LAB — ALDOSTERONE + RENIN ACTIVITY W/ RATIO
ALDO / PRA Ratio: 47.6 — ABNORMAL HIGH (ref 0.0–30.0)
Aldosterone: 10.1 ng/dL (ref 0.0–30.0)
PRA LC/MS/MS: 0.212 ng/mL/hr (ref 0.167–5.380)

## 2023-03-31 NOTE — Telephone Encounter (Signed)
Copied from CRM [id number]. Topic: General - Other >> Mar 31, 2023 11:54 AM Epimenio Foot F wrote: Reason for CRM: Pt is calling in because he needs a CPT Code for Renal Artery Ultrasound so he can give it to his insurance regarding covering services. Pt says you can leave a message with the code if he doesn't answer. Please follow up with pt.

## 2023-03-31 NOTE — Patient Instructions (Signed)
Hypertension, Adult High blood pressure (hypertension) is when the force of blood pumping through the arteries is too strong. The arteries are the blood vessels that carry blood from the heart throughout the body. Hypertension forces the heart to work harder to pump blood and may cause arteries to become narrow or stiff. Untreated or uncontrolled hypertension can lead to a heart attack, heart failure, a stroke, kidney disease, and other problems. A blood pressure reading consists of a higher number over a lower number. Ideally, your blood pressure should be below 120/80. The first ("top") number is called the systolic pressure. It is a measure of the pressure in your arteries as your heart beats. The second ("bottom") number is called the diastolic pressure. It is a measure of the pressure in your arteries as the heart relaxes. What are the causes? The exact cause of this condition is not known. There are some conditions that result in high blood pressure. What increases the risk? Certain factors may make you more likely to develop high blood pressure. Some of these risk factors are under your control, including: Smoking. Not getting enough exercise or physical activity. Being overweight. Having too much fat, sugar, calories, or salt (sodium) in your diet. Drinking too much alcohol. Other risk factors include: Having a personal history of heart disease, diabetes, high cholesterol, or kidney disease. Stress. Having a family history of high blood pressure and high cholesterol. Having obstructive sleep apnea. Age. The risk increases with age. What are the signs or symptoms? High blood pressure may not cause symptoms. Very high blood pressure (hypertensive crisis) may cause: Headache. Fast or irregular heartbeats (palpitations). Shortness of breath. Nosebleed. Nausea and vomiting. Vision changes. Severe chest pain, dizziness, and seizures. How is this diagnosed? This condition is diagnosed by  measuring your blood pressure while you are seated, with your arm resting on a flat surface, your legs uncrossed, and your feet flat on the floor. The cuff of the blood pressure monitor will be placed directly against the skin of your upper arm at the level of your heart. Blood pressure should be measured at least twice using the same arm. Certain conditions can cause a difference in blood pressure between your right and left arms. If you have a high blood pressure reading during one visit or you have normal blood pressure with other risk factors, you may be asked to: Return on a different day to have your blood pressure checked again. Monitor your blood pressure at home for 1 week or longer. If you are diagnosed with hypertension, you may have other blood or imaging tests to help your health care provider understand your overall risk for other conditions. How is this treated? This condition is treated by making healthy lifestyle changes, such as eating healthy foods, exercising more, and reducing your alcohol intake. You may be referred for counseling on a healthy diet and physical activity. Your health care provider may prescribe medicine if lifestyle changes are not enough to get your blood pressure under control and if: Your systolic blood pressure is above 130. Your diastolic blood pressure is above 80. Your personal target blood pressure may vary depending on your medical conditions, your age, and other factors. Follow these instructions at home: Eating and drinking  Eat a diet that is high in fiber and potassium, and low in sodium, added sugar, and fat. An example of this eating plan is called the DASH diet. DASH stands for Dietary Approaches to Stop Hypertension. To eat this way: Eat   plenty of fresh fruits and vegetables. Try to fill one half of your plate at each meal with fruits and vegetables. Eat whole grains, such as whole-wheat pasta, brown rice, or whole-grain bread. Fill about one  fourth of your plate with whole grains. Eat or drink low-fat dairy products, such as skim milk or low-fat yogurt. Avoid fatty cuts of meat, processed or cured meats, and poultry with skin. Fill about one fourth of your plate with lean proteins, such as fish, chicken without skin, beans, eggs, or tofu. Avoid pre-made and processed foods. These tend to be higher in sodium, added sugar, and fat. Reduce your daily sodium intake. Many people with hypertension should eat less than 1,500 mg of sodium a day. Do not drink alcohol if: Your health care provider tells you not to drink. You are pregnant, may be pregnant, or are planning to become pregnant. If you drink alcohol: Limit how much you have to: 0-1 drink a day for women. 0-2 drinks a day for men. Know how much alcohol is in your drink. In the [place] one drink equals one 12 oz bottle of beer (355 mL), one 5 oz glass of wine (148 mL), or one 1 oz glass of hard liquor (44 mL). Lifestyle  Work with your health care provider to maintain a healthy body weight or to lose weight. Ask what an ideal weight is for you. Get at least 30 minutes of exercise that causes your heart to beat faster (aerobic exercise) most days of the week. Activities may include walking, swimming, or biking. Include exercise to strengthen your muscles (resistance exercise), such as Pilates or lifting weights, as part of your weekly exercise routine. Try to do these types of exercises for 30 minutes at least 3 days a week. Do not use any products that contain nicotine or tobacco. These products include cigarettes, chewing tobacco, and vaping devices, such as e-cigarettes. If you need help quitting, ask your health care provider. Monitor your blood pressure at home as told by your health care provider. Keep all follow-up visits. This is important. Medicines Take over-the-counter and prescription medicines only as told by your health care provider. Follow directions carefully. Blood  pressure medicines must be taken as prescribed. Do not skip doses of blood pressure medicine. Doing this puts you at risk for problems and can make the medicine less effective. Ask your health care provider about side effects or reactions to medicines that you should watch for. Contact a health care provider if you: Think you are having a reaction to a medicine you are taking. Have headaches that keep coming back (recurring). Feel dizzy. Have swelling in your ankles. Have trouble with your vision. Get help right away if you: Develop a severe headache or confusion. Have unusual weakness or numbness. Feel faint. Have severe pain in your chest or abdomen. Vomit repeatedly. Have trouble breathing. These symptoms may be an emergency. Get help right away. Call 911. Do not wait to see if the symptoms will go away. Do not drive yourself to the hospital. Summary Hypertension is when the force of blood pumping through your arteries is too strong. If this condition is not controlled, it may put you at risk for serious complications. Your personal target blood pressure may vary depending on your medical conditions, your age, and other factors. For most people, a normal blood pressure is less than 120/80. Hypertension is treated with lifestyle changes, medicines, or a combination of both. Lifestyle changes include losing weight, eating a healthy,   low-sodium diet, exercising more, and limiting alcohol. This information is not intended to replace advice given to you by your health care provider. Make sure you discuss any questions you have with your health care provider. Document Revised: 07/08/2021 Document Reviewed: 07/08/2021 Elsevier Patient Education  2024 [employer]  

## 2023-03-31 NOTE — Telephone Encounter (Signed)
Pt advised.  He would like to proceed with the renal U/S   Thanks,   -Vernona Rieger

## 2023-03-31 NOTE — Progress Notes (Signed)
Subjective:    Patient ID: Grant Young, male    DOB: [birth year], 77 y.o.   MRN: [id number]  HPI  Patient presents to clinic today for TCM hospital follow-up.  He presented to the ER 7/11 with complaints of stuttering and left-sided weakness causing him to lean to the left.  He was found to be hypertensive.  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 Atrophy, chronic microvascular disease. No acute intracranial abnormality.  They wanted to obtain an MRI but he has a spinal cord stimulator which is not MRI compatible.  He was started on a nifedipine drip.  Blood pressure improved.  He was discharged 7/13 with propranolol and hydralazine as he had previously been taking.  He was also advised to follow-up with neurology regarding the stuttering episodes.  Since that time, he has had some intermittent stuttering but not more leaning to the left. His BP today is 136/84. His BP at home ranges 124/77- 200/118. He has a follow up appt with neurology 05/18/23. He would like a referral for a second opinion.   Review of Systems  Past Medical History:  Diagnosis Date   Anemia    Aneurysm of ascending aorta (HCC)    a.) CT chest 08/18/2022: 4.1 cm; b.) CT chest 12/07/2022: 4.2 cm   Anxiety    a.) on BZO (diazepam) PRN   Aortic atherosclerosis (HCC)    Chronic back pain    Coronary artery disease    a.) s/p PCI with DES x 2 (pLCx and o-mLAD) 11/27/2021   Depression    Erectile dysfunction    a.) Bi-mix (papaverine + phentolamine) injections + exogenous testosterone injections   GERD (gastroesophageal reflux disease)    h/o Lyme disease    Headache    Hiatal hernia    History of bilateral cataract extraction 01/2021   History of gastritis    History of kidney stones    History of neuropathy    Hyperlipidemia LDL goal <70    a.) CAD with NSTEMI -->  intolerant of statins with statin myopathy and memory issues.;  Also intolerant of Repatha    Hypertension    Labile blood pressures but dizziness with blood pressures in the "normal range"; intolerant of most medications including ARB's, ACE inhibitor's, amlodipine most beta-blockers other than propranolol.   Insomnia    a.) takes malatonin PRN   Left lower lobe pulmonary nodule    a.) chest CT 12/07/2022: nodules x 2 posteromedial LLL;  8 x 11 mm and 8 x 10 mm   Long term current use of antithrombotics/antiplatelets    a.) DAPT (ASA + clopidogrel)   Long term current use of aromatase inhibitor    a,) anastrozole --> estridiol suppression secondary to exogenous testosterone use   Lumbar radicular pain    Myocardial infarction (HCC)    a.) MI x 2 - 1990 * 1999 - PTCA   NSTEMI (non-ST elevated myocardial infarction) (HCC) 11/27/2021   a.) LHC 11/28/2021: sequential 75% o-pLAD, 70% OM2, 80% p-mLAD, 99% mLAD, 100% pLCx --> PCI placing a 2.5 x 26 mm Onyx Frontier DES to pLCx and a 2.5 x 24 mm Onyx Frontier DES to the o-m LAD (covering 3 lesions)   Overactive bladder    Pneumonia    PONV (postoperative nausea and vomiting)    Recurrent herpes labialis    a.) has suppressive valacyclovir to use PRN   Skin cancer, basal cell    Spinal cord  stimulator status    01/08/21 - not currently using.   Squamous cell skin cancer    Substance abuse (HCC)     Current Outpatient Medications  Medication Sig Dispense Refill   Alirocumab (PRALUENT) 75 MG/ML SOAJ Inject 1 mL (75 mg total) into the skin every 14 (fourteen) days. 2 mL 2   anastrozole (ARIMIDEX) 1 MG tablet 1/2 tablet (0.5 mg) by mouth once weekly     cetirizine (ZYRTEC) 10 MG tablet Take 10 mg by mouth daily as needed for allergies.     Cholecalciferol 50 MCG (2000 UT) CAPS Take 1 tablet by mouth daily.     clopidogrel (PLAVIX) 75 MG tablet TAKE 1 TABLET BY MOUTH ONCE DAILY. 90 tablet 2   Cyanocobalamin (VITAMIN B-12) 5000 MCG LOZG Take 1 lozenge by mouth daily.      DHEA 25 MG CAPS Take 25 mg by mouth daily.     diazepam (VALIUM) 5  MG tablet TAKE 1 TABLET IN THE MORNING AND 2 TABLETS AT BEDTIME 90 tablet 0   dicyclomine (BENTYL) 10 MG capsule Take 1 capsule (10 mg total) by mouth 4 (four) times daily -  before meals and at bedtime. 90 capsule 1   Digestive Aids Mixture (DIGESTION GB PO) Take 1 capsule by mouth daily.     ezetimibe (ZETIA) 10 MG tablet TAKE 1 TABLET BY MOUTH EVERY DAY 90 tablet 2   ferrous sulfate 325 (65 FE) MG tablet Take 325 mg by mouth. Once daily on Mondays, Wednesdays and Fridays     hydrALAZINE (APRESOLINE) 25 MG tablet Take 25 mg by mouth 3 (three) times daily.     ipratropium (ATROVENT) 0.06 % nasal spray Place 1 spray into both nostrils 4 (four) times daily as needed for rhinitis. For up to 5-7 days then stop. 15 mL 0   Loperamide HCl (IMODIUM PO) Take by mouth as needed.      MAGNESIUM BISGLYCINATE PO Take by mouth daily.     melatonin 5 MG TABS Take 5 mg by mouth at bedtime.     NONFORMULARY OR COMPOUNDED ITEM Bi-Mix Papaverine 30mg , Phentolamine 1mg    Dosage: Inject 1cc per injection    Vial 1ml   Qty #5 Refills 6 5 each 6   nortriptyline (PAMELOR) 10 MG capsule Take 10 mg in the morning and 30 mg at night and continue that dose     Nutritional Supplements (NUTRITIONAL SUPPLEMENT PO) Take 1 capsule by mouth daily. [name]     polyethylene glycol (MIRALAX / GLYCOLAX) 17 g packet Take 17 g by mouth daily as needed.     POTASSIUM PO Take 1 tablet by mouth as directed. Takes 5x weekly.     Probiotic Product (PROBIOTIC DAILY PO) Take by mouth daily.     prochlorperazine (COMPAZINE) 10 MG tablet Take 1 tablet (10 mg total) by mouth 2 (two) times daily as needed for nausea or vomiting. 180 tablet 0   propranolol ER (INDERAL LA) 80 MG 24 hr capsule Take 1 capsule (80 mg total) by mouth daily. 90 capsule 3   pyridostigmine (MESTINON) 60 MG tablet Take 0.5 tablets (30 mg total) by mouth 2 (two) times daily. 90 tablet 0   RABEprazole (ACIPHEX) 20 MG tablet TAKE 1 TABLET BY MOUTH TWICE  A DAY 180 tablet 3   sucralfate (CARAFATE) 1 g tablet Take 1 tablet (1 g total) by mouth 4 (four) times daily as needed. 360 tablet 1   testosterone cypionate (DEPOTESTOSTERONE CYPIONATE)  200 MG/ML injection Inject 80 mg into the muscle every 14 (fourteen) days.     valACYclovir (VALTREX) 1000 MG tablet Take 2 tabs p.o. and repeat in 12 hours as needed for cold sore 30 tablet 0   venlafaxine XR (EFFEXOR XR) 75 MG 24 hr capsule Take 1 capsule (75 mg total) by mouth daily with breakfast. (Patient taking differently: Take 75 mg by mouth daily. Take with supper - 5pm) 90 capsule 1   No current facility-administered medications for this visit.    Allergies  Allergen Reactions   Ace Inhibitors     Other reaction(s): Cough   Fluoxetine Anxiety    "made me fall asleep" per pt "bad headaches and "makes  Me  Crazy" historical allergy noted in [employer] "made me fall asleep" per pt "bad headaches and "makes  Me  Crazy" Per New Patient Packet.     Metoclopramide     Other reaction(s): Other (See Comments), Other (See Comments), Unknown Tardive Dyskinesia  historical allergy noted in [employer] Tardive Dyskinesia  Per New Patient Packet.   Nalbuphine     Used Post Back surgery- Anesthesiologist Error. Patient had Narcotic Withdraw. Per New Patient Packet.    Other     Other reaction(s): Other (See Comments) Altered mental status in combo with narcotics at previous hospitalization - Full Withdrawal Symptoms Other reaction(s): Rash   Amoxicillin-Pot Clavulanate Nausea Only    Per New Patient Packet.   Doxazosin Rash    Other reaction(s): Other - See Comments, Rash UNKNOWN REACTION UNKNOWN REACTION    Duloxetine Nausea Only    Per New Patient Packet.    Penicillins Nausea Only    Per New Patient Packet.   Tamsulosin Itching and Anxiety    Restless, Flushing, Heavy Chest, Itching, Hyperactive mood and Anxiety. Unable to handle side effects. Per New Patient Packet.     Trazodone And  Nefazodone Itching, Anxiety and Rash    Headache. "INCREASED MY ANXIETY AND HEARTRATE" Flushing, tachycardia "INCREASED MY ANXIETY AND HEARTRATE" Per New Patient Packet.    Amlodipine     Shaking, unsure of reaction. Per New Patient Packet.     Cinoxacin     GI Intolerance, and Dizziness. Per New Patient Packet.    Ciprofloxacin     Other reaction(s): Unknown   Fludrocortisone Other (See Comments)    "Worsening headaches, GI issues, fatigue"   Nebivolol     Other reaction(s): Unknown   Olanzapine     Headache and unable to sleep for 3 nights. Per New Patient Packet.    Olmesartan     Other reaction(s): Unknown   Pregabalin     Confusion, Lack of concentration, dizziness, and likely drowsiness. Per New Patient Packet.     Prostaglandins     Other reaction(s): Other (See Comments) Intolerance   Thyroid Hormones     Other reaction(s): Other (See Comments) Thyroid (Nature Thyroid) contraindicated with some of your other medications.   Zolpidem     Nightmares, Ineffective after 2 days. Per New Patient Packet.    Duloxetine Hcl     Other reaction(s): Rash   Fluoxetine Hcl     Other reaction(s): Rash   Nucynta [Tapentadol] Other (See Comments)    Vertigo    Phenytoin Anxiety    Hyperactivity, and Ineffective. Per New Patient Packet.     Family History  Problem Relation Age of Onset   Heart disease Father    Heart failure Father    Hypertension Father  Stroke Father    Stroke Mother    Dementia Mother    Kidney Stones Daughter    Anxiety disorder Daughter    OCD Daughter     Social History   Socioeconomic History   Marital status: Married    Spouse name: Therapist, art   Number of children: Not on file   Years of education: Not on file   Highest education level: Not on file  Occupational History   Not on file  Tobacco Use   Smoking status: Never    Passive exposure: Never   Smokeless tobacco: Never  Vaping Use   Vaping status: Never Used  Substance and Sexual  Activity   Alcohol use: Yes    Comment: 1 Drink a Month, socially   Drug use: Not Currently   Sexual activity: Yes    Birth control/protection: None  Other Topics Concern   Not on file  Social History Narrative   Tobacco use, amount per day now: None   Past tobacco use, amount per day: None   How many years did you use tobacco: 0   Alcohol use (drinks per week): 0-1 Month   Diet:   Do you drink/eat things with caffeine: Occasionally ( Hot Chocolate and maybe 1/4 of 16oz Pepsi 2-3 times a week.   Marital status: Married                                  What year were you married? 1970   Do you live in a house, apartment, assisted living, condo, trailer, etc.? House   Is it one or more stories? One   How many persons live in your home? 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 Yes                                    Type and how often? [place], Recumbent Bike, 3 times a week. Try to get a 1.2 mile walk at least 3 times a week or more.     Do you have a living will? Yes   Do you have a DNR form?  No                                 If not, do you want to discuss one?   Do you have signed POA/HPOA forms? Yes                       If so, please bring to you appointment    Do you have any difficulty bathing or dressing yourself? No    Do you have difficulty preparing food or eating? No   Do you have difficulty managing your medications? No   Do you have any difficulty managing your finances? No   Do you have any difficulty affording your medications? No         Social Determinants of Health   Financial Resource Strain: Low Risk  (12/03/2022)   Overall Financial Resource Strain (CARDIA)    Difficulty of Paying Living Expenses: Not hard at all  Food Insecurity: No Food Insecurity (03/30/2023)   Hunger Vital Sign     Worried About Running Out of Food in the Last Year: Never true    Ran Out of Food in the Last Year: Never true  Transportation Needs: No Transportation Needs (03/30/2023)   PRAPARE - Administrator, Civil Service (Medical): No    Lack of Transportation (Non-Medical): No  Physical Activity: Insufficiently Active (12/03/2022)   Exercise Vital Sign    Days of Exercise per Week: 3 days    Minutes of Exercise per Session: 30 min  Stress: No Stress Concern Present (12/03/2022)   [employer] of Occupational Health - Occupational Stress Questionnaire    Feeling of Stress : Not at all  Social Connections: Moderately Integrated (12/03/2022)   Social Connection and Isolation Panel [NHANES]    Frequency of Communication with Friends and Family: Twice a week    Frequency of Social Gatherings with Friends and Family: More than three times a week    Attends Religious Services: More than 4 times per year    Active Member of [employer] or Organizations: No    Attends Banker Meetings: Never    Marital Status: Married  Catering manager Violence: Not At Risk (03/25/2023)   Humiliation, Afraid, Rape, and Kick questionnaire    Fear of Current or Ex-Partner: No    Emotionally Abused: No    Physically Abused: No    Sexually Abused: No     Constitutional: Patient reports headaches.  Denies fever, malaise, fatigue, or abrupt weight changes.  HEENT: Denies eye pain, eye redness, ear pain, ringing in the ears, wax buildup, runny nose, nasal congestion, bloody nose, or sore throat. Respiratory: Denies difficulty breathing, shortness of breath, cough or sputum production.   Cardiovascular: Denies chest pain, chest tightness, palpitations or swelling in the hands or feet.  Gastrointestinal: Denies abdominal pain, bloating, constipation, diarrhea or blood in the stool.  GU: Patient reports urinary frequency.  Denies urgency, pain with urination, burning sensation, blood in urine, odor or  discharge. Musculoskeletal: Patient reports chronic joint pain.  Denies decrease in range of motion, difficulty with gait, muscle pain or joint swelling.  Skin: Denies redness, rashes, lesions or ulcercations.  Neurological: Patient reports tremor, restless legs, neuropathic pain.  Denies dizziness, difficulty with memory, difficulty with speech or problems with balance and coordination.  Psych: Patient has a history of anxiety and depression.  Denies SI/HI.  No other specific complaints in a complete review of systems (except as listed in HPI above).     Objective:   Physical Exam   BP 136/84 (BP Location: Left Arm, Patient Position: Sitting, Cuff Size: Normal)   Pulse 78   Temp (!) 96.6 F (35.9 C) (Temporal)   Wt 141 lb (64 kg)   SpO2 98%   BMI 20.82 kg/m   Wt Readings from Last 3 Encounters:  03/25/23 134 lb 0.6 oz (60.8 kg)  02/23/23 139 lb (63 kg)  02/02/23 139 lb (63 kg)    General: Appears his stated age, chronically ill appearing, in NAD. Skin: Warm, dry and intact.  HEENT: Head: normal shape and size; Eyes: sclera white, no icterus, conjunctiva pink, PERRLA and EOMs intact, no nystagmus noted;  Cardiovascular: Normal rate and rhythm. S1,S2 noted.  No murmur, rubs or gallops noted. No JVD or BLE edema. No carotid bruits noted. Pulmonary/Chest: Normal effort and positive vesicular breath sounds. No respiratory distress. No wheezes, rales or ronchi noted.  Musculoskeletal: Strength 5/5  BUE/BLE. No difficulty with gait.  Neurological: Alert and oriented. Resting tremor noted of head and hands. Psychiatric: Mood and affect normal. Behavior is normal. Judgment and thought content normal.     BMET    Component Value Date/Time   NA 140 03/27/2023 0503   NA 146 (H) 12/26/2021 1524   K 3.1 (L) 03/27/2023 0503   CL 112 (H) 03/27/2023 0503   CO2 23 03/27/2023 0503   GLUCOSE 98 03/27/2023 0503   BUN 11 03/27/2023 0503   BUN 14 12/26/2021 1524   CREATININE 0.66  03/27/2023 0503   CREATININE 0.76 09/18/2022 1025   CALCIUM 8.7 (L) 03/27/2023 0503   GFRNONAA >60 03/27/2023 0503   GFRAA 101 05/20/2020 0000    Lipid Panel     Component Value Date/Time   CHOL 164 03/26/2023 0629   TRIG 70 03/26/2023 0629   HDL 49 03/26/2023 0629   CHOLHDL 3.3 03/26/2023 0629   VLDL 14 03/26/2023 0629   LDLCALC 101 (H) 03/26/2023 0629   LDLCALC 103 (H) 11/11/2021 1006    CBC    Component Value Date/Time   WBC 6.9 03/26/2023 0629   RBC 5.36 03/26/2023 0629   HGB 16.0 03/26/2023 0629   HGB 14.4 12/26/2021 1524   HCT 47.8 03/26/2023 0629   HCT 43.5 12/26/2021 1524   PLT 213 03/26/2023 0629   PLT 202 12/26/2021 1524   MCV 89.2 03/26/2023 0629   MCV 90 12/26/2021 1524   MCH 29.9 03/26/2023 0629   MCHC 33.5 03/26/2023 0629   RDW 13.2 03/26/2023 0629   RDW 13.3 12/26/2021 1524   LYMPHSABS 1.2 04/19/2022 0952   MONOABS 1.2 (H) 04/19/2022 0952   EOSABS 0.0 04/19/2022 0952   BASOSABS 0.0 04/19/2022 0952    Hgb A1C Lab Results  Component Value Date   HGBA1C 5.2 03/25/2023           Assessment & Plan:   TCM hospital follow-up for acute encephalopathy secondary to hypertensive emergency:  ER notes, labs and imaging reviewed BP looks good here in the office today although he is getting very labile numbers at home Continue propranolol and hydralazine as previously prescribed He questions whether he needs a referral to nephrology to get his blood pressure under control although I do not recommend that at this time given his normal kidney function and he does not have history of resistant hypertension We tried to obtain a renal artery ultrasound however the computer reports that his Medicare will not pay for this.  He will call the insurance company to discuss this He will plan to wean nortriptyline as he feels like this is causing a lot of the increased dizziness, tremors that he has been experiencing Referral placed to neurology for second opinion  per his request   RTC in 2 months for follow-up of chronic conditions Nicki Reaper, NP

## 2023-03-31 NOTE — Telephone Encounter (Signed)
93975/76775- are the codes listed for renal artery ultrasound

## 2023-04-01 ENCOUNTER — Telehealth: Payer: Self-pay

## 2023-04-01 NOTE — Telephone Encounter (Signed)
Ultrasound ordered

## 2023-04-01 NOTE — Addendum Note (Signed)
Addended by: Lorre Munroe on: 04/01/2023 07:25 AM   Modules accepted: Orders

## 2023-04-01 NOTE — Telephone Encounter (Signed)
I ordered the ultrasound this morning.  I have no idea how to give him a paper copy of the referral.  It is all electronic.

## 2023-04-01 NOTE — Telephone Encounter (Signed)
Copied from CRM [id number]. Topic: General - Inquiry >> Apr 01, 2023  8:21 AM Payton Doughty wrote: Reason for CRM: pt wants to know if he  has any orders for imaging w/ contrast. (He can't have MRI) Pt also would like the order (letter ) Rene Kocher sent about his approval for neuro.  He misplaced the other.

## 2023-04-02 NOTE — Telephone Encounter (Signed)
Left message advising patient orders can be viewed in mychart.

## 2023-04-06 ENCOUNTER — Other Ambulatory Visit: Payer: Self-pay | Admitting: Internal Medicine

## 2023-04-07 NOTE — Telephone Encounter (Signed)
Requested medication (s) are due for refill today: yes  Requested medication (s) are on the active medication list: yes  Last refill:  05/22/22 #90   Future visit scheduled: no  Notes to clinic:  med not assigned to a protocol   Requested Prescriptions  Pending Prescriptions Disp Refills   pyridostigmine (MESTINON) 60 MG tablet [Pharmacy Med Name: PYRIDOSTIGMINE BR 60 MG TABLET] 90 tablet 0    Sig: Take 0.5 tablets (30 mg total) by mouth 2 (two) times daily.     Off-Protocol Failed - 04/06/2023  2:33 AM      Failed - Medication not assigned to a protocol, review manually.      Passed - Valid encounter within last 12 months    Recent Outpatient Visits           1 week ago Acute encephalopathy   [facility] [place], Salvadore Oxford, NP   1 month ago Acute non-recurrent pansinusitis   [facility] [place], [place] W, NP   4 months ago Coronary artery disease involving native coronary artery of native heart without angina pectoris   [facility] [place], Salvadore Oxford, NP   5 months ago Essential hypertension   [facility] [place], Gentry Fitz A, RPH-CPP   6 months ago Generalized abdominal pain   [place] [facility] [place], Salvadore Oxford, NP       Future Appointments             In 4 weeks Herbie Baltimore, Piedad Climes, MD [facility]   In 1 month Richardo Hanks, Laurette Schimke, MD [facility] [place]   In 1 month [place], Salvadore Oxford, NP [facility]

## 2023-04-08 LAB — METANEPHRINES, PLASMA
Metanephrine, Free: 48.2 pg/mL (ref 0.0–88.0)
Normetanephrine, Free: 172.3 pg/mL (ref 0.0–285.2)

## 2023-04-09 ENCOUNTER — Ambulatory Visit
Admission: RE | Admit: 2023-04-09 | Discharge: 2023-04-09 | Disposition: A | Discharge status: Home / Self Care | Payer: Medicare Other | Source: Ambulatory Visit | Attending: Internal Medicine | Admitting: Internal Medicine

## 2023-04-09 DIAGNOSIS — R0989 Other specified symptoms and signs involving the circulatory and respiratory systems: Secondary | ICD-10-CM | POA: Diagnosis not present

## 2023-04-11 ENCOUNTER — Encounter: Payer: Self-pay | Admitting: Internal Medicine

## 2023-04-14 ENCOUNTER — Telehealth: Payer: Self-pay

## 2023-04-14 NOTE — Telephone Encounter (Signed)
[place] state board of workers compensation has denied the P[place], twice, at all 3 levels of the board. There is nothing else I can do to get this prior authorization.

## 2023-04-15 ENCOUNTER — Telehealth: Payer: Self-pay | Admitting: Gastroenterology

## 2023-04-15 ENCOUNTER — Ambulatory Visit
Payer: Medicare Other | Attending: Student in an Organized Health Care Education/Training Program | Admitting: Student in an Organized Health Care Education/Training Program

## 2023-04-15 ENCOUNTER — Encounter: Payer: Self-pay | Admitting: Student in an Organized Health Care Education/Training Program

## 2023-04-15 VITALS — BP 194/97 | HR 71 | Temp 97.2°F | Resp 16 | Ht 69.0 in | Wt 140.0 lb

## 2023-04-15 DIAGNOSIS — G894 Chronic pain syndrome: Secondary | ICD-10-CM | POA: Diagnosis not present

## 2023-04-15 DIAGNOSIS — M47816 Spondylosis without myelopathy or radiculopathy, lumbar region: Secondary | ICD-10-CM | POA: Diagnosis present

## 2023-04-15 NOTE — Progress Notes (Signed)
PROVIDER NOTE: Information contained herein reflects review and annotations entered in association with encounter. Interpretation of such information and data should be left to medically-trained personnel. Information provided to patient can be located elsewhere in the medical record under "Patient Instructions". Document created using STT-dictation technology, any transcriptional errors that may result from process are unintentional.    Patient: Grant Young  Service Category: E/M  Provider: Edward Jolly, MD  DOB: April 24, 1946  DOS: 04/15/2023  Referring Provider: Lorre Munroe, NP  MRN: [id number]  Specialty: Interventional Pain Management  PCP: Lorre Munroe, NP  Type: Established Patient  Setting: Ambulatory outpatient    Location: Office  Delivery: Face-to-face     HPI  [name], a 77 y.o. year old male, is here today because of his Lumbar facet arthropathy [M47.816]. [name] primary complain today is Back Pain  Pertinent problems: [name] has Lumbar facet arthropathy and Chronic pain syndrome on their pertinent problem list. Pain Assessment: Severity of Chronic pain is reported as a 8 /10. Location: Back Lower, Right, Left/ . Onset: More than a month ago. Quality: Aching, Sharp, Discomfort, Radiating, Stabbing, Constant. Timing: Intermittent. Modifying factor(s): ice,heat, rest. Vitals:  height is 5\' 9"  (1.753 m) and weight is 140 lb (63.5 kg). His temperature is 97.2 F (36.2 C) (abnormal). His blood pressure is 194/97 (abnormal) and his pulse is 71. His respiration is 16 and oxygen saturation is 100%.  BMI: Estimated body mass index is 20.67 kg/m as calculated from the following:   Height as of this encounter: 5\' 9"  (1.753 m).   Weight as of this encounter: 140 lb (63.5 kg). Last encounter: 02/02/2023. Last procedure: 10/28/2022.  Reason for encounter: patient-requested evaluation.   Patient is somewhat frustrated that his [employer] peripheral nerve stimulation has not been  approved by [employer]  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  He states that he may need to do an appeal and have a judge court hearing for his case.  I told him that I could help support this if need be.  In the interim, patient has had 2 positive diagnostic lumbar facet medial branch nerve blocks both of which provided 70% pain relief for at least 5 days.  We discussed lumbar radiofrequency ablation of the medial branch nerves for the purpose of obtaining longer duration of pain relief.  Risk and benefits were reviewed and patient would like to proceed with that.    ROS  Constitutional: Denies any fever or chills Gastrointestinal: No reported hemesis, hematochezia, vomiting, or acute GI distress Musculoskeletal:  Positive low back pain Neurological: No reported episodes of acute onset apraxia, aphasia, dysarthria, agnosia, amnesia, paralysis, loss of coordination, or loss of consciousness  Medication Review  Alirocumab, Cholecalciferol, DHEA, Digestive Aids Mixture, Loperamide HCl, Magnesium Bisglycinate, NONFORMULARY OR COMPOUNDED ITEM, Nutritional Supplements, Potassium, Probiotic Product, RABEprazole, Vitamin B-12, anastrozole, cetirizine, clopidogrel, diazepam, dicyclomine, ezetimibe, ferrous sulfate, hydrALAZINE, ipratropium, melatonin, nortriptyline, polyethylene glycol, prochlorperazine, propranolol ER, pyridostigmine, sucralfate, testosterone cypionate, valACYclovir, and venlafaxine XR  History Review  Allergy: [name] is allergic to ace inhibitors, fluoxetine, metoclopramide, nalbuphine, other, amoxicillin-pot clavulanate, doxazosin, duloxetine, penicillins, tamsulosin, trazodone and nefazodone, amlodipine, cinoxacin, ciprofloxacin, fludrocortisone, nebivolol, olanzapine, olmesartan, pregabalin, prostaglandins,  thyroid hormones, zolpidem, duloxetine hcl, fluoxetine hcl, nucynta [tapentadol], and phenytoin. Drug: [name]  reports that he does not currently use drugs. Alcohol:  reports  current alcohol use. Tobacco:  reports that he has never smoked. He has never been exposed to tobacco smoke. He has never used smokeless tobacco. Social: [name]  reports that he has never smoked. He has never been exposed to tobacco smoke. He has never used smokeless tobacco. He reports current alcohol use. He reports that he does not currently use drugs. Medical:  has a past medical history of Anemia, Aneurysm of ascending aorta (HCC), Anxiety, Aortic atherosclerosis (HCC), Chronic back pain, Coronary artery disease, Depression, Erectile dysfunction, GERD (gastroesophageal reflux disease), h/o Lyme disease, Headache, Hiatal hernia, History of bilateral cataract extraction (01/2021), History of gastritis, History of kidney stones, History of neuropathy, Hyperlipidemia LDL goal <70, Hypertension, Insomnia, Left lower lobe pulmonary nodule, Long term current use of antithrombotics/antiplatelets, Long term current use of aromatase inhibitor, Lumbar radicular pain, Myocardial infarction [facility]), NSTEMI (non-ST elevated myocardial infarction) (HCC) (11/27/2021), Overactive bladder, Pneumonia, PONV (postoperative nausea and vomiting), Recurrent herpes labialis, Skin cancer, basal cell, Spinal cord stimulator status, Squamous cell skin cancer, and Substance abuse (HCC). Surgical: [name]  has a past surgical history that includes Kidney stone surgery (09/14/1980); Kidney stone surgery (09/15/1995); Lithotripsy (09/14/2014); Lithotripsy (09/14/1996); Lithotripsy (09/14/1997); Gallbladder surgery (09/15/2015); Sigmoidoscopy (09/14/2017); Colonoscopy (09/15/2015); Cholecystectomy (2017); Shoulder surgery (Right, 1984); Back surgery; Spinal cord stimulator implant; Pain pump implantation; Pain pump removal; Tonsillectomy; Foot surgery (Bilateral,  03/18/2020); Foot surgery (08/032021); Upper gi endoscopy; Colonoscopy with propofol (N/A, 10/03/2020); Esophagogastroduodenoscopy (egd) with propofol (N/A, 10/03/2020); Cataract extraction w/PHACO (Left, 01/14/2021); Cataract extraction w/PHACO (Right, 01/28/2021); Coronary/Graft Acute MI Revascularization (N/A, 11/27/2021); LEFT HEART CATH AND CORONARY ANGIOGRAPHY (N/A, 11/27/2021); Excision basal cell carcinoma; transthoracic echocardiogram (11/28/2021); and Video bronchoscopy with endobronchial ultrasound (N/A, 01/15/2023). Family: family history includes Anxiety disorder in his daughter; Dementia in his mother; Heart disease in his father; Heart failure in his father; Hypertension in his father; Kidney Stones in his daughter; OCD in his daughter; Stroke in his father and mother.  Laboratory Chemistry Profile   Renal Lab Results  Component Value Date   BUN 11 03/27/2023   CREATININE 0.66 03/27/2023   BCR SEE NOTE: 09/18/2022   GFRAA 101 05/20/2020   GFRNONAA >60 03/27/2023    Hepatic Lab Results  Component Value Date   AST 25 03/25/2023   ALT 22 03/25/2023   ALBUMIN 4.6 03/25/2023   ALKPHOS 128 (H) 03/25/2023   AMYLASE 32 09/18/2022   LIPASE 14 09/18/2022    Electrolytes Lab Results  Component Value Date   NA 140 03/27/2023   K 3.1 (L) 03/27/2023   CL 112 (H) 03/27/2023   CALCIUM 8.7 (L) 03/27/2023   MG 2.4 03/25/2023   PHOS 3.6 11/30/2021    Bone Lab Results  Component Value Date   TESTOSTERONE 33.2 05/20/2020    Inflammation (CRP: Acute Phase) (ESR: Chronic Phase) Lab Results  Component Value Date   CRP 22.9 (H) 08/18/2022   LATICACIDVEN 1.4 08/18/2022         Note: Above Lab results reviewed.  Recent Imaging Review  US Renal Artery Stenosis CLINICAL DATA:  Labile hypertension.  EXAM: RENAL DUPLEX DOPPLER ULTRASOUND  TECHNIQUE: Duplex and color Doppler ultrasound was utilized to evaluate blood flow in the renal arteries and kidneys.  COMPARISON:  None  Available.  FINDINGS: Right Renal Artery Velocities:  Origin:  114 cm/sec  Mid:  142 cm/sec  Hilum:  123 cm/sec  Interlobar:  36 cm/sec  Arcuate: 23 cm/sec  Left Renal Artery Velocities:  Origin:  108 cm/sec  Mid:  84 cm/sec  Hilum:  96 cm/sec  Interlobar:  34 cm/sec  Arcuate:  40 cm/sec  Aortic Velocity: 90 cm/sec  Right Renal-Aortic Ratios:  Origin: 1.3  Mid:  1.6  Hilum: 1.4  Interlobar: 0.4  Arcuate: 0.3  Left Renal-Aortic Ratios:  Origin: 1.2  Mid: 0.9  Hilum: 1.1  Interlobar: 0.4  Arcuate: 0.4  The kidneys appear symmetric in size. Both kidneys demonstrate increased cortical echogenicity likely reflecting underlying component of chronic kidney disease. Small simple cyst of the left kidney measures up to 1.4 cm and is consistent with a Bosniak 1 cyst requiring no follow-up. There is no evidence to suggest significant renal artery stenosis bilaterally. Renal artery waveforms are unremarkable. No evidence of abdominal aortic aneurysm. Bilateral renal veins are normally patent.  IMPRESSION: 1. No evidence of significant renal artery stenosis bilaterally. 2. Increased cortical echogenicity of both kidneys likely reflecting underlying component of chronic kidney disease.  Electronically Signed   By: Irish Lack M.D.   On: 04/09/2023 14:33 Note: Reviewed        Physical Exam  General appearance: Well nourished, well developed, and well hydrated. In no apparent acute distress Mental status: Alert, oriented x 3 (person, place, & time)       Respiratory: No evidence of acute respiratory distress Eyes: PERLA Vitals: BP (!) 194/97   Pulse 71   Temp (!) 97.2 F (36.2 C)   Resp 16   Ht 5\' 9"  (1.753 m)   Wt 140 lb (63.5 kg)   SpO2 100%   BMI 20.67 kg/m  BMI: Estimated body mass index is 20.67 kg/m as calculated from the following:   Height as of this encounter: 5\' 9"  (1.753 m).   Weight as of this encounter: 140 lb (63.5  kg). Ideal: Ideal body weight: 70.7 kg (155 lb 13.8 oz)  Lumbar Spine Area Exam  Skin & Axial Inspection: Well healed scar from previous spine surgery detected IPG present from SCS Alignment: Scoliosis detected Functional ROM: Pain restricted ROM       Stability: No instability detected Muscle Tone/Strength: Functionally intact. No obvious neuro-muscular anomalies detected. Sensory (Neurological): Facetogenic pain pattern, pain with facet loading   Hyperextension/rotation test: (+) bilaterally for facet joint pain. Lumbar quadrant test (Kemp's test): (+) bilaterally for facet joint pain.     Gait & Posture Assessment  Ambulation: Unassisted Gait: Relatively normal for age and body habitus Posture: WNL    Lower Extremity Exam      Side: Right lower extremity   Side: Left lower extremity  Stability: No instability observed           Stability: No instability observed          Skin & Extremity Inspection: Skin color, temperature, and hair growth are WNL. No peripheral edema or cyanosis. No masses, redness, swelling, asymmetry, or associated skin lesions. No contractures.   Skin & Extremity Inspection: Skin color, temperature, and hair growth are WNL. No peripheral edema or cyanosis. No masses, redness, swelling, asymmetry, or associated skin lesions. No contractures.  Functional ROM: Unrestricted ROM                   Functional ROM: Unrestricted ROM                  Muscle Tone/Strength: Functionally intact. No obvious neuro-muscular anomalies detected.   Muscle Tone/Strength: Functionally intact. No obvious neuro-muscular anomalies detected.  Sensory (Neurological): Unimpaired         Sensory (Neurological):  Unimpaired        DTR: Patellar: deferred today Achilles: deferred today Plantar: deferred today   DTR: Patellar: deferred today Achilles: deferred today Plantar: deferred today  Palpation: No palpable anomalies   Palpation: No palpable anomalies       Assessment    Diagnosis Status  1. Lumbar facet arthropathy   2. Lumbar spondylosis   3. Chronic pain syndrome    Persistent Persistent Controlled     Plan of Care   Orders:  Orders Placed This Encounter  Procedures   Radiofrequency,Lumbar    Standing Status:   Future    Standing Expiration Date:   07/16/2023    Scheduling Instructions:     Side(s): Bilateral     Level(s): L2, L3, L4, L5, Medial Branch Nerve(s)     Sedation: IV sedation     Scheduling Timeframe: As soon as pre-approved    Order Specific Question:   Where will this procedure be performed?    Answer:   [facility]   Follow-up plan:   Return in about 18 days (around 05/03/2023) for B/L L2, 3, 4, 5 RFA, in clinic IV Versed.      Status post Botox No. 1 on 01/01/2020 for migraine, lumbar TPI as well. SPG block 03/13/20. Bilateral GONB 04/01/20. 11/13/20 B/L L3,4,5 Facet blocks helpful repeat prn              Recent Visits Date Type Provider Dept  02/02/23 Office Visit Edward Jolly, MD [facility]  Showing recent visits within past 90 days and meeting all other requirements Today's Visits Date Type Provider Dept  04/15/23 Office Visit Edward Jolly, MD [facility]  Showing today's visits and meeting all other requirements Future Appointments No visits were found meeting these conditions. Showing future appointments within next 90 days and meeting all other requirements  I discussed the assessment and treatment plan with the patient. The patient was provided an opportunity to ask questions and all were answered. The patient agreed with the plan and demonstrated an understanding of the instructions.  Patient advised to call back or seek an in-person evaluation if the symptoms or condition worsens.  Duration of encounter: .  Total time on encounter, as per AMA guidelines included both the face-to-face and non-face-to-face time personally spent by the physician and/or other qualified  health care professional(s) on the day of the encounter (includes time in activities that require the physician or other qualified health care professional and does not include time in activities normally performed by clinical staff). Physician's time may include the following activities when performed: Preparing to see the patient (e.g., pre-charting review of records, searching for previously ordered imaging, lab work, and nerve conduction tests) Review of prior analgesic pharmacotherapies. Reviewing PMP Interpreting ordered tests (e.g., lab work, imaging, nerve conduction tests) Performing post-procedure evaluations, including interpretation of diagnostic procedures Obtaining and/or reviewing separately obtained history Performing a medically appropriate examination and/or evaluation Counseling and educating the patient/family/caregiver Ordering medications, tests, or procedures Referring and communicating with other health care professionals (when not separately reported) Documenting clinical information in the electronic or other health record Independently interpreting results (not separately reported) and communicating results to the patient/ family/caregiver Care coordination (not separately reported)  Note by: Edward Jolly, MD Date: 04/15/2023; Time: 3:13 PM

## 2023-04-15 NOTE — Patient Instructions (Addendum)
______________________________________________________________________  Preparing for your procedure  Appointments: If you think you may not be able to keep your appointment, call 24-48 hours in advance to cancel. We need time to make it available to others.  During your procedure appointment there will be: No Prescription Refills. No disability issues to discussed. No medication changes or discussions.  Instructions: Food intake: Avoid eating anything solid for at least 8 hours prior to your procedure. Clear liquid intake: You may take clear liquids such as water up to 2 hours prior to your procedure. (No carbonated drinks. No soda.) Transportation: Unless otherwise stated by your physician, bring a driver. Morning Medicines: Except for blood thinners, take all of your other morning medications with a sip of water. Make sure to take your heart and blood pressure medicines. If your blood pressure's lower number is above 100, the case will be rescheduled. Blood thinners: Make sure to stop your blood thinners as instructed.  If you take a blood thinner, but were not instructed to stop it, call our office [phone] and ask to talk to a nurse. Not stopping a blood thinner prior to certain procedures could lead to serious complications. Diabetics on insulin: Notify the staff so that you can be scheduled 1st case in the morning. If your diabetes requires high dose insulin, take only  of your normal insulin dose the morning of the procedure and notify the staff that you have done so. Preventing infections: Shower with an antibacterial soap the morning of your procedure.  Build-up your immune system: Take 1000 mg of Vitamin C with every meal (3 times a day) the day prior to your procedure. Antibiotics: Inform the nursing staff if you are taking any antibiotics or if you have any conditions that may require antibiotics prior to procedures. (Example: recent joint implants)   Pregnancy: If you are  pregnant make sure to notify the nursing staff. Not doing so may result in injury to the fetus, including death.  Sickness: If you have a cold, fever, or any active infections, call and cancel or reschedule your procedure. Receiving steroids while having an infection may result in complications. Arrival: You must be in the facility at least 30 minutes prior to your scheduled procedure. Tardiness: Your scheduled time is also the cutoff time. If you do not arrive at least 15 minutes prior to your procedure, you will be rescheduled.  Children: Do not bring any children with you. Make arrangements to keep them home. Dress appropriately: There is always a possibility that your clothing may get soiled. Avoid long dresses. Valuables: Do not bring any jewelry or valuables.  Reasons to call and reschedule or cancel your procedure: (Following these recommendations will minimize the risk of a serious complication.) Surgeries: Avoid having procedures within 2 weeks of any surgery. (Avoid for 2 weeks before or after any surgery). Flu Shots: Avoid having procedures within 2 weeks of a flu shots or . (Avoid for 2 weeks before or after immunizations). Barium: Avoid having a procedure within 7-10 days after having had a radiological study involving the use of radiological contrast. (Myelograms, Barium swallow or enema study). Heart attacks: Avoid any elective procedures or surgeries for the initial 6 months after a "Myocardial Infarction" (Heart Attack). Blood thinners: It is imperative that you stop these medications before procedures. Let us know if you if you take any blood thinner.  Infection: Avoid procedures during or within two weeks of an infection (including chest colds or gastrointestinal problems). Symptoms associated with infections  include: Localized redness, fever, chills, night sweats or profuse sweating, burning sensation when voiding, cough, congestion, stuffiness, runny nose, sore throat, diarrhea,  nausea, vomiting, cold or Flu symptoms, recent or current infections. It is specially important if the infection is over the area that we intend to treat. Heart and lung problems: Symptoms that may suggest an active cardiopulmonary problem include: cough, chest pain, breathing difficulties or shortness of breath, dizziness, ankle swelling, uncontrolled high or unusually low blood pressure, and/or palpitations. If you are experiencing any of these symptoms, cancel your procedure and contact your primary care physician for an evaluation.  Remember:  Regular Business hours are:  Monday to Thursday 8:00 AM to 4:00 PM  Provider's Schedule: Delano Metz, MD:  Procedure days: Tuesday and Thursday 7:30 AM to 4:00 PM  Edward Jolly, MD:  Procedure days: Monday and Wednesday 7:30 AM to 4:00 PM  Stop Plavix 7 days before your procedure.  ______________________________________________________________________

## 2023-04-15 NOTE — Telephone Encounter (Signed)
Patient find out that his mother has colonoscopy cancer. He stated that it is time for he to have an EGD and colonoscopy done.

## 2023-04-15 NOTE — Progress Notes (Signed)
Safety precautions to be maintained throughout the outpatient stay will include: orient to surroundings, keep bed in low position, maintain call bell within reach at all times, provide assistance with transfer out of bed and ambulation.  

## 2023-04-19 NOTE — Telephone Encounter (Signed)
Patient called on Friday afternoon-04/16/2023 stating that he's been having gi distress and would like to have an upper endoscopy and a colonoscopy. Patient stated that he was due to have his procedures, but I looked at his last office note and procedure results from 2022 and I did not see anything stating that he needed a procedure done this year since he is already 77 years of age. However, he wanted to make sure that he didn't have colon cancer as he found out that his mother had colon cancer. Please advise if he needs an appointment first with you or Inetta Fermo.

## 2023-04-20 ENCOUNTER — Other Ambulatory Visit: Payer: Self-pay

## 2023-04-20 ENCOUNTER — Emergency Department: Payer: Medicare Other

## 2023-04-20 ENCOUNTER — Emergency Department
Admission: EM | Admit: 2023-04-20 | Discharge: 2023-04-20 | Disposition: A | Discharge status: Home / Self Care | Payer: Medicare Other | Attending: Emergency Medicine | Admitting: Emergency Medicine

## 2023-04-20 DIAGNOSIS — R1033 Periumbilical pain: Secondary | ICD-10-CM | POA: Insufficient documentation

## 2023-04-20 DIAGNOSIS — I10 Essential (primary) hypertension: Secondary | ICD-10-CM | POA: Insufficient documentation

## 2023-04-20 DIAGNOSIS — R1013 Epigastric pain: Secondary | ICD-10-CM | POA: Diagnosis present

## 2023-04-20 DIAGNOSIS — I2510 Atherosclerotic heart disease of native coronary artery without angina pectoris: Secondary | ICD-10-CM | POA: Diagnosis not present

## 2023-04-20 DIAGNOSIS — R1084 Generalized abdominal pain: Secondary | ICD-10-CM

## 2023-04-20 LAB — CBC
HCT: 47.2 % (ref 39.0–52.0)
Hemoglobin: 15.3 g/dL (ref 13.0–17.0)
MCH: 30 pg (ref 26.0–34.0)
MCHC: 32.4 g/dL (ref 30.0–36.0)
MCV: 92.5 fL (ref 80.0–100.0)
Platelets: 226 10*3/uL (ref 150–400)
RBC: 5.1 MIL/uL (ref 4.22–5.81)
RDW: 13.3 % (ref 11.5–15.5)
WBC: 6.8 10*3/uL (ref 4.0–10.5)
nRBC: 0 % (ref 0.0–0.2)

## 2023-04-20 LAB — COMPREHENSIVE METABOLIC PANEL
ALT: 36 U/L (ref 0–44)
AST: 28 U/L (ref 15–41)
Albumin: 3.9 g/dL (ref 3.5–5.0)
Alkaline Phosphatase: 110 U/L (ref 38–126)
Anion gap: 6 (ref 5–15)
BUN: 17 mg/dL (ref 8–23)
CO2: 28 mmol/L (ref 22–32)
Calcium: 8.9 mg/dL (ref 8.9–10.3)
Chloride: 108 mmol/L (ref 98–111)
Creatinine, Ser: 0.8 mg/dL (ref 0.61–1.24)
GFR, Estimated: >60 mL/min (ref >60)
Glucose, Bld: 92 mg/dL (ref 70–99)
Potassium: 4 mmol/L (ref 3.5–5.1)
Sodium: 142 mmol/L (ref 135–145)
Total Bilirubin: 0.3 mg/dL (ref 0.3–1.2)
Total Protein: 6.7 g/dL (ref 6.5–8.1)

## 2023-04-20 LAB — URINALYSIS, ROUTINE W REFLEX MICROSCOPIC
Bilirubin Urine: NEGATIVE
Glucose, UA: NEGATIVE mg/dL
Hgb urine dipstick: NEGATIVE
Ketones, ur: NEGATIVE mg/dL
Leukocytes,Ua: NEGATIVE
Nitrite: NEGATIVE
Protein, ur: NEGATIVE mg/dL
Specific Gravity, Urine: 1.017 (ref 1.005–1.030)
pH: 7 (ref 5.0–8.0)

## 2023-04-20 LAB — LIPASE, BLOOD: Lipase: 37 U/L (ref 11–51)

## 2023-04-20 LAB — TROPONIN I (HIGH SENSITIVITY): Troponin I (High Sensitivity): 9 ng/L (ref <18)

## 2023-04-20 MED ORDER — DICYCLOMINE HCL 10 MG PO CAPS: 10.0000 mg | ORAL_CAPSULE | Freq: Three times a day (TID) | ORAL | 0 refills | Status: DC

## 2023-04-20 MED ORDER — SUCRALFATE 1 G PO TABS: 1.0000 g | ORAL_TABLET | Freq: Four times a day (QID) | ORAL | 0 refills | Status: DC

## 2023-04-20 MED ORDER — SODIUM CHLORIDE 0.9 % IV BOLUS
500.0000 mL | Freq: Once | INTRAVENOUS | Status: AC
  Administered 2023-04-20: 500 mL via INTRAVENOUS

## 2023-04-20 MED ORDER — IOHEXOL 300 MG/ML  SOLN
100.0000 mL | Freq: Once | INTRAMUSCULAR | Status: AC | PRN
  Administered 2023-04-20: 100 mL via INTRAVENOUS

## 2023-04-20 MED ORDER — BUTALBITAL-APAP-CAFFEINE 50-300-40 MG PO CAPS: 1.0000 | ORAL_CAPSULE | Freq: Four times a day (QID) | ORAL | 0 refills | Status: DC | PRN

## 2023-04-20 NOTE — Addendum Note (Signed)
Addended by: Lorre Munroe on: 04/20/2023 08:05 AM   Modules accepted: Orders

## 2023-04-20 NOTE — Discharge Instructions (Addendum)
As we discussed please begin taking sucralfate/Carafate 4 times a day for the next 2 weeks (before breakfast, lunch, dinner and before bed).  For the next 7 days please begin taking your Bentyl before breakfast lunch and dinner.  I was able to contact Dr. Tobi Bastos he will be calling you at home to arrange a follow-up appointment.  Return to the emergency department for any symptom personally concerning to yourself.

## 2023-04-20 NOTE — Telephone Encounter (Signed)
Called patient but was not able to leave him a voicemail. Therefore, I will send him a MyChart message since he is active.

## 2023-04-20 NOTE — ED Triage Notes (Addendum)
Pt to ed from home via POV for chronic abd pain and nausea that has been going on x 2-3 months. Pt has been on meds for same with no relief. Pt has not been seen by GI as of yet. Pt denies any CP, SOB or other symptoms. Pt is caox4, in no acute distress and ambulatory in triage. Pt was just admitted on 7/1 of this year and they failed to mention the abd pain to the doctors to have them evaluate while he was here.

## 2023-04-20 NOTE — ED Provider Notes (Signed)
[facility] Provider Note    Event Date/Time   First MD Initiated Contact with Patient 04/20/23 1612     (approximate)  History   Chief Complaint: Abdominal Pain (X 2 months)  HPI  Grant Young is a 77 y.o. male with a past medical history of anxiety, chronic back pain, CAD status post 2 MIs with stents, gastric reflux, hypertension, who presents to the emergency department for abdominal pain.  According to the patient for the past 2 months he has been experiencing intermittent abdominal pain which he describes as more epigastric pain that comes and goes however over the past week or so it has been more significant.  Patient states he has tried some over-the-counter medications for this without relief.  Denies any nausea or vomiting does state intermittent diarrhea but states this is somewhat chronic for him.  No urinary symptoms.  Physical Exam   Triage Vital Signs: ED Triage Vitals  Encounter Vitals Group     BP 04/20/23 1506 (!) 181/97     Systolic BP Percentile --      Diastolic BP Percentile --      Pulse Rate 04/20/23 1506 65     Resp 04/20/23 1506 17     Temp 04/20/23 1506 98.2 F (36.8 C)     Temp src --      SpO2 04/20/23 1506 97 %     Weight 04/20/23 1459 138 lb 14.2 oz (63 kg)     Height 04/20/23 1459 5\' 9"  (1.753 m)     Head Circumference --      Peak Flow --      Pain Score 04/20/23 1459 8     Pain Loc --      Pain Education --      Exclude from Growth Chart --     Most recent vital signs: Vitals:   04/20/23 1604 04/20/23 1618  BP: (!) 184/72 (!) 175/76  Pulse: (!) 58 61  Resp: 16 16  Temp:    SpO2: 100% 100%    General: Awake, no distress.  CV:  Good peripheral perfusion.  Regular rate and rhythm  Resp:  Normal effort.  Equal breath sounds bilaterally.  Abd:  No distention.  Soft, mild but fairly diffuse tenderness mostly in the epigastric periumbilical region but he states some tenderness in all quadrants.  ED Results /  Procedures / Treatments   EKG  EKG viewed and interpreted by myself shows a sinus rhythm at 69 bpm with a narrow QRS, normal axis, normal intervals, no concerning ST changes.  Occasional PVC.  RADIOLOGY  I have reviewed and interpreted CT images.  No significant abnormality such as bowel obstruction identified. Radiology is read the CT scan as no acute process.  There is a 12 x 11 mm lung nodule.  Patient is aware of this nodule and recently had a bronchoscopy performed.  His doctor will continue to monitor per patient.   MEDICATIONS ORDERED IN ED: Medications  iohexol (OMNIPAQUE) 300 MG/ML solution 100 mL (has no administration in time range)  sodium chloride 0.9 % bolus 500 mL (500 mLs Intravenous New Bag/Given 04/20/23 1636)     IMPRESSION / MDM / ASSESSMENT AND PLAN / ED COURSE  I reviewed the triage vital signs and the nursing notes.  Patient's presentation is most consistent with acute presentation with potential threat to life or bodily function.  Patient presents to the emergency department for intermittent abdominal pain over the last 2  months.  Lab work is reassuring colluding a normal CBC, normal chemistry normal lipase.  We obtained a urine sample I have also added on a troponin given the patient's CAD history.  Will obtain a CT scan abdomen/pelvis to further evaluate.  Patient agreeable to plan of care.  Patient's workup is reassuring, lipase is normal, CMP is normal including LFTs.  CBC is normal with a normal white blood cell count troponin negative, urinalysis normal.  CT scan shows no acute findings.  It did show a 12 x 11 mm lung nodule.  Patient states he is aware this and recently had a bronchoscopy for biopsy.  He states his doctor is monitoring but at this time no intervention needed.  Given the patient's symptoms I did discuss the patient with Dr. Tobi Bastos who will see him in the office.  Dr. Tobi Bastos is the patient's GI physician who he has seen in the past.  In the meantime I  will place the patient on Carafate 4 times daily as well as Bentyl 3 times daily to see if this helps the patient's symptoms.  His symptoms are very suggestive of IBS as they come 1 to 2 hours after eating he states.  Often associated with diarrhea.  FINAL CLINICAL IMPRESSION(S) / ED DIAGNOSES   Abdominal pain  Rx / DC Orders   Carafate 4 times daily x 2 weeks Bentyl 3 times daily x 7 days  Note:  This document was prepared using Dragon voice recognition software and may include unintentional dictation errors.   Minna Antis, MD 04/20/23 1910

## 2023-04-20 NOTE — ED Notes (Addendum)
Pt has been dealing with this epigastric pain for the past 2-3 months and just had an injection of preluent 2 weeks ago for cholesterol for his dr's comfort as a heart pt and this pain has gotten worse. Pt has also been dealing with diarrhea on and off. Pt has tried OTC medicines to no avail. Pt has abdominal tenderness.

## 2023-04-27 ENCOUNTER — Telehealth: Payer: Self-pay

## 2023-04-27 NOTE — Telephone Encounter (Signed)
Transition Care Management Unsuccessful Follow-up Telephone Call  Date of discharge and from where:  [place] 8/6  Attempts:  1st Attempt  Reason for unsuccessful TCM follow-up call:  No answer/busy   Lenard Forth [facility] Guide, [facility] [phone] [address] [place], [place], [place] [address] Phone: [phone] Email: [email or web address]

## 2023-04-28 ENCOUNTER — Telehealth: Payer: Self-pay

## 2023-04-28 NOTE — Telephone Encounter (Signed)
Transition Care Management Unsuccessful Follow-up Telephone Call  Date of discharge and from where:  [place] 8/6  Attempts:  2nd Attempt  Reason for unsuccessful TCM follow-up call:  No answer/busy   Lenard Forth [facility] Guide, [facility] [phone] [address] [place], [place], [place] [address] Phone: [phone] Email: [email or web address]

## 2023-05-01 ENCOUNTER — Encounter: Payer: Self-pay | Admitting: Internal Medicine

## 2023-05-01 ENCOUNTER — Other Ambulatory Visit: Payer: Self-pay | Admitting: Internal Medicine

## 2023-05-03 MED ORDER — DIAZEPAM 5 MG PO TABS: ORAL_TABLET | ORAL | 0 refills | Status: DC

## 2023-05-06 ENCOUNTER — Ambulatory Visit: Payer: Medicare Other | Attending: Cardiology | Admitting: Cardiology

## 2023-05-06 ENCOUNTER — Encounter: Payer: Self-pay | Admitting: Cardiology

## 2023-05-06 VITALS — BP 138/82 | HR 62 | Ht 69.0 in | Wt 136.4 lb

## 2023-05-06 DIAGNOSIS — E785 Hyperlipidemia, unspecified: Secondary | ICD-10-CM | POA: Diagnosis not present

## 2023-05-06 DIAGNOSIS — G720 Drug-induced myopathy: Secondary | ICD-10-CM | POA: Insufficient documentation

## 2023-05-06 DIAGNOSIS — R0989 Other specified symptoms and signs involving the circulatory and respiratory systems: Secondary | ICD-10-CM | POA: Insufficient documentation

## 2023-05-06 DIAGNOSIS — T466X5A Adverse effect of antihyperlipidemic and antiarteriosclerotic drugs, initial encounter: Secondary | ICD-10-CM | POA: Insufficient documentation

## 2023-05-06 DIAGNOSIS — I2510 Atherosclerotic heart disease of native coronary artery without angina pectoris: Secondary | ICD-10-CM | POA: Insufficient documentation

## 2023-05-06 MED ORDER — HYDRALAZINE HCL 25 MG PO TABS: 25.0000 mg | ORAL_TABLET | Freq: Two times a day (BID) | ORAL | 3 refills | Status: DC

## 2023-05-06 MED ORDER — PROPRANOLOL HCL ER 60 MG PO CP24: 60.0000 mg | ORAL_CAPSULE | Freq: Two times a day (BID) | ORAL | 3 refills | Status: DC

## 2023-05-06 NOTE — Patient Instructions (Addendum)
Medication Instructions:   Start Taking Hydralazine 25 mg twice a day in the morning and at dinner.  May take an extra dose for blood pressure greater than 160.  Propanolol 60 mg twice a day at lunch and bedtime  Be sure you're alternating the weeks you take your Testosterone and Praluent. Do not take on the same week together.  *If you need a refill on your cardiac medications before your next appointment, please call your pharmacy*   Lab Work: none If you have labs (blood work) drawn today and your tests are completely normal, you will receive your results only by: MyChart Message (if you have MyChart) OR A paper copy in the mail If you have any lab test that is abnormal or we need to change your treatment, we will call you to review the results.   Testing/Procedures: none   Follow-Up: At [facility], you and your health needs are our priority.  As part of our continuing mission to provide you with exceptional heart care, we have created designated Provider Care Teams.  These Care Teams include your primary Cardiologist (physician) and Advanced Practice Providers (APPs -  Physician Assistants and Nurse Practitioners) who all work together to provide you with the care you need, when you need it.  We recommend signing up for the patient portal called "MyChart".  Sign up information is provided on this After Visit Summary.  MyChart is used to connect with patients for Virtual Visits (Telemedicine).  Patients are able to view lab/test results, encounter notes, upcoming appointments, etc.  Non-urgent messages can be sent to your provider as well.   To learn more about what you can do with MyChart, go to [email or web address].    Your next appointment:   2 months  Provider:   Eula Listen

## 2023-05-06 NOTE — Progress Notes (Signed)
Cardiology Office Note:  .   Date:  05/22/2023  ID:  AURELIUS HOLL, DOB Jan 27, 1946, MRN [id number] PCP: Lorre Munroe, NP  [facility] Providers Cardiologist:  Bryan Lemma, MD     Chief Complaint  Patient presents with   Follow-up    Hosp admit 7/11 - 7/13 - dx hypertensive encephalopathy. BP has been fluctuating since hospitalization. Meds reviewed.     History of Present Illness: .     Grant Young is a  77 y.o. male (resident of [facility]) with a PMH noted below who presents here for 54-month/hospital follow-up at the request of Lorre Munroe, NP.  PMH  CAD-MI x 3: CAD with MI x 2 in 1990 & 1999 s/p PTCA and with  NSTEMI 11/2021: status post PCI/DES to the LAD and LCx EF Echo 50 to 55%. => HFpEF HTN,  HLD (with statin myopathy)-has been on Repatha however complained of symptoms.  Grant Young was last seen on December 31, 2022 -> during this visit, the wife spoke quite a bit and he did not provide much information.  He appeared to be a relatively poor historian.  Noted having issues with headaches and dizziness sleep disturbance will Repatha.  He therefore stopped it.  He also stopped hydralazine because of dizziness that led him to actually stop doing his physical therapy.  Felt his heart rate going up with the dizziness.  BP log showed range from 124/64-158/81 with most of the BPs in the 130s or 40s.  Despite this he also has noted some occasional episodes of pressures in the 180s.  Otherwise doing fine from a cardiac standpoint as far as any angina or dyspnea.  He felt that a lot of his change in symptoms were related to her being weaned off of his narcotic (Norco).  Plavix held temporarily then restarted & ASA d/c'd 2 weeks after weaning off of norco we will decrease propranolol to 1/2 tablet and change hydralazine to as needed for top blood pressure number greater than 160 DC hydralazine as a standing medication. => He will use it only PRN for  sustained SBP greater than 160 mmHg.      Subjective  INTERVAL HISTORY 03/25/2023 - ER for Hypertensive Encephalopathy => noted having an episode of unsteady gait being pulled to the left side.  This occurred while going to the store shortly after showering having completed his daily exercise routine.  He sat on a chair having anticipated a fall.  Shortly thereafter felt better and went home.  Upon arrival at home he started having stuttered speech that was persistent.  No vision changes. => Told to go to the ER.  Noted to have elevated blood pressures and was started on nicardipine infusion. => All other evaluations seem to be normal and therefore this was felt related to hypertensive encephalopathy. ER visit 04/20/2023 with generalized abdominal pain  Grant Young presents with his wife and he brings his blood pressure log that is pretty confusing.  The range of blood pressures is pretty high.  Despite the broad range of blood pressures, there were no significant pressures with the highest being in the mid 170s over 90s.  Only 1 recording of 192/94.  All the rest of the more mostly in the 150s to 160s.  1 low reading of 118/69.  What he has been doing is he actually increased his Inderal now up to either 60 or 80 mg twice daily.  He actually  has only been using hydralazine as needed.  On his BP log he has indicated using hydralazine almost every day with the directions to take it for systolic pressures greater than 160.  He is very concerned about these blood pressure recordings and he says he does have headaches when the pressures are high although he cannot tell me how frequent the headaches come.  He still has an unsteady gait.  Despite this, he denies any chest pain pressure tightness at rest or exertion.  No PND orthopnea edema.  No further encephalopathy episodes.  No syncope or near syncope.  As for his lipids, he was seen in lipid clinic on 03/24/2023 and switched over from Repatha and started  taking Praluent although he started noticing a headache so he stopped that but he is willing to restart.  Still taking Zetia.   Tolerating Plavix without bleeding issues.  ROS:  Cardiovascular ROS: no chest pain or dyspnea on exertion positive for - labile blood pressures with intermittent headaches and dizziness.  Fatigue. negative for - edema, irregular heartbeat, orthopnea, palpitations, paroxysmal nocturnal dyspnea, rapid heart rate, shortness of breath, or any further TIA/amaurosis fugax symptoms, encephalopathy, syncope or near syncope.  Claudication. Review of Systems - Negative except There is generalized baseline fatigue.  He has back pain joint pains and myalgias.  Dizziness and weakness noted above.  Headaches.  Memory loss and anxiety.    Objective   Studies Reviewed: .       Cardiac Cath PCI 11/28/2021: Acute Coronary Syndrome / NSTEMI (with STEMI physiology)=> 2V CAD: Culprit lesion 100% occluded proximal and mid LCx (Onyx Frontier 2.5 mm x 26 mm deployed to 2.7 mm.), Sequential ostial proximal LAD 75, 80 to 99% stenosis ( Onyx Frontier 2.5 mm x 34 mm-deployed/postdilated to 2.8 mm )     ECHO 03/28/2023: EF 50 to 55%.  No RWMA.  Low normal function.  Mild concentric LVH and severely dilated LA.  Trivial MR..  Indeterminate diastolic parameters.  Normal RV with normal PAP, normal RAP. Renal artery Dopplers 04/09/2023: No significant stenosis bilaterally.  Increased cortical echogenicity of both kidneys really flecking underlying component of chronic CKD   Risk Assessment/Calculations:              Physical Exam:   VS:  BP 138/82   Pulse 62   Ht 5\' 9"  (1.753 m)   Wt 136 lb 6.4 oz (61.9 kg)   SpO2 96%   BMI 20.14 kg/m    Wt Readings from Last 3 Encounters:  05/20/23 139 lb 7 oz (63.2 kg)  05/10/23 139 lb 8 oz (63.3 kg)  05/06/23 136 lb 6.4 oz (61.9 kg)    GEN: Well nourished, well developed in no acute distress; healthy-appearing but very anxious and nervous. NECK:  No JVD; No carotid bruits CARDIAC: Normal S1, S2; RRR, no murmurs, rubs, gallops RESPIRATORY:  Clear to auscultation without rales, wheezing or rhonchi ; nonlabored, good air movement. ABDOMEN: Soft, non-tender, non-distended EXTREMITIES:  No edema; No deformity; generalized weakness with abnormal gait.   LABS: Lab Results  Component Value Date   CHOL 164 03/26/2023   HDL 49 03/26/2023   LDLCALC 101 (H) 03/26/2023   LDLDIRECT 98.5 01/30/2022   TRIG 70 03/26/2023   CHOLHDL 3.3 03/26/2023       ASSESSMENT AND PLAN: .    Problem List Items Addressed This Visit       Cardiology Problems   Coronary artery disease involving native coronary artery of native heart  without angina pectoris (Chronic)    Despite having all these blood pressure issues she is not having any active cardiac anginal symptoms.  No CHF symptoms.  Plan: Although not favorable as the beta-blocker of choice, that the only tolerated like beta-blocker has been propranolol. => He has been adjusting his own medications.  His pressures seem to be doing relatively well right now. Will make propranolol 60 mg twice daily at lunch and bedtime Continue Zetia and restart Praluent. Continue Plavix.  Okay to hold Plavix 5 to 7 days preop for surgeries or procedures.      Relevant Medications   propranolol ER (INDERAL LA) 60 MG 24 hr capsule   hydrALAZINE (APRESOLINE) 25 MG tablet   Hyperlipidemia LDL goal <70 (Chronic)    Continue Zetia  Has failed multiple statins due to myopathy.  Also Repatha.  Will try to have him restart Praluent. => Can reassess lipids several months after starting Praluent.  If not able to tolerate Praluent but the other remaining option would be inclisiran.  Wanted him to ensure that he alternates weeks between taking testosterone and Praluent to avoid overlap      Relevant Medications   propranolol ER (INDERAL LA) 60 MG 24 hr capsule   hydrALAZINE (APRESOLINE) 25 MG tablet   Labile  hypertension - Primary (Chronic)    His blood pressure ranges pretty dramatic.  Very difficult to tell what he is actually taking.  Seems to be doing better with higher doses of twice daily propranolol, but heart rates are varying.  Plan: Start Taking Hydralazine 25 mg twice a day in the morning and at dinner.  May take an extra dose for blood pressure greater than 160. Propanolol 60 mg twice a day at lunch and bedtime      Relevant Medications   propranolol ER (INDERAL LA) 60 MG 24 hr capsule   hydrALAZINE (APRESOLINE) 25 MG tablet     Other   Statin myopathy (Chronic)    Has been intolerant of multiple different statins (atorvastatin, rosuvastatin, pravastatin and simvastatin).  Noted myalgias and headaches.  Also had strange symptoms with Repatha citing headaches and nausea. He has tolerated Zetia and was recently switched over to Praluent although he started noting symptoms on Praluent but is willing to restart.  If Praluent does not work, then I think the only other option would be considering inclisiran.           Dispo: Return in about 2 months (around 07/06/2023) for Routine Follow-up after testing ~ 1-2 months.  Total time spent: 20 min spent with patient + 18 min spent charting = 38 min    Signed, Marykay Lex, MD, MS Bryan Lemma, M.D., M.S. Interventional Cardiologist  [facility]  Pager # [phone] Phone # [phone] [address] [place], [place] [address]

## 2023-05-10 ENCOUNTER — Ambulatory Visit
Admission: RE | Admit: 2023-05-10 | Discharge: 2023-05-10 | Disposition: A | Discharge status: Home / Self Care | Payer: Medicare Other | Source: Ambulatory Visit | Attending: Student in an Organized Health Care Education/Training Program | Admitting: Student in an Organized Health Care Education/Training Program

## 2023-05-10 ENCOUNTER — Ambulatory Visit
Payer: Worker's Compensation | Attending: Student in an Organized Health Care Education/Training Program | Admitting: Student in an Organized Health Care Education/Training Program

## 2023-05-10 ENCOUNTER — Encounter: Payer: Self-pay | Admitting: Student in an Organized Health Care Education/Training Program

## 2023-05-10 DIAGNOSIS — M47816 Spondylosis without myelopathy or radiculopathy, lumbar region: Secondary | ICD-10-CM | POA: Diagnosis present

## 2023-05-10 DIAGNOSIS — G894 Chronic pain syndrome: Secondary | ICD-10-CM | POA: Diagnosis present

## 2023-05-10 MED ORDER — DEXAMETHASONE SODIUM PHOSPHATE 10 MG/ML IJ SOLN
10.0000 mg | Freq: Once | INTRAMUSCULAR | Status: AC
  Administered 2023-05-10: 10 mg
  Filled 2023-05-10: qty 1

## 2023-05-10 MED ORDER — MIDAZOLAM HCL 2 MG/2ML IJ SOLN
0.5000 mg | Freq: Once | INTRAMUSCULAR | Status: AC
  Administered 2023-05-10: 2 mg via INTRAVENOUS
  Filled 2023-05-10: qty 2

## 2023-05-10 MED ORDER — LIDOCAINE HCL 2 % IJ SOLN
20.0000 mL | Freq: Once | INTRAMUSCULAR | Status: AC
  Administered 2023-05-10: 400 mg
  Filled 2023-05-10: qty 20

## 2023-05-10 MED ORDER — LACTATED RINGERS IV SOLN: Freq: Once | INTRAVENOUS | Status: AC

## 2023-05-10 MED ORDER — ROPIVACAINE HCL 2 MG/ML IJ SOLN
9.0000 mL | Freq: Once | INTRAMUSCULAR | Status: AC
  Administered 2023-05-10: 9 mL via PERINEURAL
  Filled 2023-05-10: qty 20

## 2023-05-10 MED ORDER — ROPIVACAINE HCL 2 MG/ML IJ SOLN
9.0000 mL | Freq: Once | INTRAMUSCULAR | Status: AC
  Administered 2023-05-10: 9 mL via PERINEURAL

## 2023-05-10 NOTE — Progress Notes (Signed)
Safety precautions to be maintained throughout the outpatient stay will include: orient to surroundings, keep bed in low position, maintain call bell within reach at all times, provide assistance with transfer out of bed and ambulation.  

## 2023-05-10 NOTE — Patient Instructions (Signed)

## 2023-05-10 NOTE — Progress Notes (Signed)
PROVIDER NOTE: Interpretation of information contained herein should be left to medically-trained personnel. Specific patient instructions are provided elsewhere under "Patient Instructions" section of medical record. This document was created in part using STT-dictation technology, any transcriptional errors that may result from this process are unintentional.  Patient: Grant Young Type: Established DOB: 1945-12-27 MRN: [id number] PCP: Lorre Munroe, NP  Service: Procedure DOS: 05/10/2023 Setting: Ambulatory Location: Ambulatory outpatient facility Delivery: Face-to-face Provider: Edward Jolly, MD Specialty: Interventional Pain Management Specialty designation: 09 Location: Outpatient facility Ref. Prov.: Edward Jolly, MD       Interventional Therapy   Procedure: Lumbar Facet, Medial Branch Radiofrequency Ablation (RFA) #1  Laterality: Bilateral (-50)  Level: L2, L3, L4, and L5 Medial Branch Level(s). These levels will denervate the L2-3, L3-4, and L4-5 lumbar facet joints.  Imaging: Fluoroscopy-guided         Anesthesia: Local anesthesia (1-2% Lidocaine) Anxiolysis:IV Versed DOS: 05/10/2023  Performed by: Edward Jolly, MD  Purpose: Therapeutic/Palliative Indications: Low back pain severe enough to impact quality of life or function. Indications: 1. Lumbar facet arthropathy   2. Lumbar spondylosis   3. Chronic pain syndrome    Grant Young has been dealing with the above chronic pain for longer than three months and has either failed to respond, was unable to tolerate, or simply did not get enough benefit from other more conservative therapies including, but not limited to: 1. Over-the-counter medications 2. Anti-inflammatory medications 3. Muscle relaxants 4. Membrane stabilizers 5. Opioids 6. Physical therapy and/or chiropractic manipulation 7. Modalities (Heat, ice, etc.) 8. Invasive techniques such as nerve blocks. Grant Young has attained more than 50% relief of the pain  from a series of diagnostic injections conducted in separate occasions.  Pain Score: Pre-procedure: 8 /10 Post-procedure: 6 /10     Position / Prep / Materials:  Position: Prone  Prep solution: DuraPrep (Iodine Povacrylex [0.7% available iodine] and Isopropyl Alcohol, 74% w/w) Prep Area: Entire Lumbosacral Region (Lower back from mid-thoracic region to end of tailbone and from flank to flank.) Materials:  Tray: RFA (Radiofrequency) tray Needle(s):  Type: RFA (Teflon-coated radiofrequency ablation needles) Gauge (G): 22  Length: Regular (10cm) Qty: 4      H&P (Pre-op Assessment):  Grant Young is a 77 y.o. (year old), male patient, seen today for interventional treatment. He  has a past surgical history that includes Kidney stone surgery (09/14/1980); Kidney stone surgery (09/15/1995); Lithotripsy (09/14/2014); Lithotripsy (09/14/1996); Lithotripsy (09/14/1997); Gallbladder surgery (09/15/2015); Sigmoidoscopy (09/14/2017); Colonoscopy (09/15/2015); Cholecystectomy (2017); Shoulder surgery (Right, 1984); Back surgery; Spinal cord stimulator implant; Pain pump implantation; Pain pump removal; Tonsillectomy; Foot surgery (Bilateral, 03/18/2020); Foot surgery (08/032021); Upper gi endoscopy; Colonoscopy with propofol (N/A, 10/03/2020); Esophagogastroduodenoscopy (egd) with propofol (N/A, 10/03/2020); Cataract extraction w/PHACO (Left, 01/14/2021); Cataract extraction w/PHACO (Right, 01/28/2021); Coronary/Graft Acute MI Revascularization (N/A, 11/27/2021); LEFT HEART CATH AND CORONARY ANGIOGRAPHY (N/A, 11/27/2021); Excision basal cell carcinoma; transthoracic echocardiogram (11/28/2021); and Video bronchoscopy with endobronchial ultrasound (N/A, 01/15/2023). Grant Young has a current medication list which includes the following prescription(s): praluent, anastrozole, butalbital-apap-caffeine, cetirizine, cholecalciferol, clopidogrel, vitamin b-12, dhea, diazepam, dicyclomine, digestive aids mixture,  ezetimibe, ferrous sulfate, hydralazine, ipratropium, loperamide hcl, magnesium bisglycinate, melatonin, NONFORMULARY OR COMPOUNDED ITEM, nortriptyline, nutritional supplements, polyethylene glycol, potassium, probiotic product, prochlorperazine, propranolol er, pyridostigmine, rabeprazole, testosterone cypionate, valacyclovir, venlafaxine xr, and sucralfate. His primarily concern today is the Back Pain (Mid lower)  Initial Vital Signs:  Pulse/HCG Rate: 63ECG Heart Rate: 64 Temp: (!) 97.5 F (36.4 C) Resp: 16 BP: (!) 167/90 SpO2:  100 %  BMI: Estimated body mass index is 20.6 kg/m as calculated from the following:   Height as of this encounter: 5\' 9"  (1.753 m).   Weight as of this encounter: 139 lb 8 oz (63.3 kg).  Risk Assessment: Allergies: Reviewed. He is allergic to ace inhibitors, fluoxetine, metoclopramide, nalbuphine, other, amoxicillin-pot clavulanate, doxazosin, duloxetine, penicillins, tamsulosin, trazodone and nefazodone, amlodipine, cinoxacin, ciprofloxacin, fludrocortisone, nebivolol, olanzapine, olmesartan, pregabalin, prostaglandins, thyroid hormones, zolpidem, duloxetine hcl, fluoxetine hcl, nucynta [tapentadol], and phenytoin.  Allergy Precautions: None required Coagulopathies: Reviewed. None identified.  Blood-thinner therapy: None at this time Active Infection(s): Reviewed. None identified. Grant Young is afebrile  Site Confirmation: Grant Young was asked to confirm the procedure and laterality before marking the site Procedure checklist: Completed Consent: Before the procedure and under the influence of no sedative(s), amnesic(s), or anxiolytics, the patient was informed of the treatment options, risks and possible complications. To fulfill our ethical and legal obligations, as recommended by the American Medical Association's Code of Ethics, I have informed the patient of my clinical impression; the nature and purpose of the treatment or procedure; the risks, benefits, and  possible complications of the intervention; the alternatives, including doing nothing; the risk(s) and benefit(s) of the alternative treatment(s) or procedure(s); and the risk(s) and benefit(s) of doing nothing. The patient was provided information about the general risks and possible complications associated with the procedure. These may include, but are not limited to: failure to achieve desired goals, infection, bleeding, organ or nerve damage, allergic reactions, paralysis, and death. In addition, the patient was informed of those risks and complications associated to Spine-related procedures, such as failure to decrease pain; infection (i.e.: Meningitis, epidural or intraspinal abscess); bleeding (i.e.: epidural hematoma, subarachnoid hemorrhage, or any other type of intraspinal or peri-dural bleeding); organ or nerve damage (i.e.: Any type of peripheral nerve, nerve root, or spinal cord injury) with subsequent damage to sensory, motor, and/or autonomic systems, resulting in permanent pain, numbness, and/or weakness of one or several areas of the body; allergic reactions; (i.e.: anaphylactic reaction); and/or death. Furthermore, the patient was informed of those risks and complications associated with the medications. These include, but are not limited to: allergic reactions (i.e.: anaphylactic or anaphylactoid reaction(s)); adrenal axis suppression; blood sugar elevation that in diabetics may result in ketoacidosis or comma; water retention that in patients with history of congestive heart failure may result in shortness of breath, pulmonary edema, and decompensation with resultant heart failure; weight gain; swelling or edema; medication-induced neural toxicity; particulate matter embolism and blood vessel occlusion with resultant organ, and/or nervous system infarction; and/or aseptic necrosis of one or more joints. Finally, the patient was informed that Medicine is not an exact science; therefore, there  is also the possibility of unforeseen or unpredictable risks and/or possible complications that may result in a catastrophic outcome. The patient indicated having understood very clearly. We have given the patient no guarantees and we have made no promises. Enough time was given to the patient to ask questions, all of which were answered to the patient's satisfaction. Mr. Mccants has indicated that he wanted to continue with the procedure. Attestation: I, the ordering provider, attest that I have discussed with the patient the benefits, risks, side-effects, alternatives, likelihood of achieving goals, and potential problems during recovery for the procedure that I have provided informed consent. Date  Time: 05/10/2023  9:33 AM   Pre-Procedure Preparation:  Monitoring: As per clinic protocol. Respiration, ETCO2, SpO2, BP, heart rate and rhythm monitor placed and  checked for adequate function Safety Precautions: Patient was assessed for positional comfort and pressure points before starting the procedure. Time-out: I initiated and conducted the "Time-out" before starting the procedure, as per protocol. The patient was asked to participate by confirming the accuracy of the "Time Out" information. Verification of the correct person, site, and procedure were performed and confirmed by me, the nursing staff, and the patient. "Time-out" conducted as per Joint Commission's Universal Protocol (UP.01.01.01). Time: 1022 Start Time: 1022 hrs.  Description of Procedure:          Laterality: See above. Levels:  See above. Safety Precautions: Aspiration looking for blood return was conducted prior to all injections. At no point did we inject any substances, as a needle was being advanced. Before injecting, the patient was told to immediately notify me if he was experiencing any new onset of "ringing in the ears, or metallic taste in the mouth". No attempts were made at seeking any paresthesias. Safe injection practices  and needle disposal techniques used. Medications properly checked for expiration dates. SDV (single dose vial) medications used. After the completion of the procedure, all disposable equipment used was discarded in the proper designated medical waste containers. Local Anesthesia: Protocol guidelines were followed. The patient was positioned over the fluoroscopy table. The area was prepped in the usual manner. The time-out was completed. The target area was identified using fluoroscopy. A 12-in long, straight, sterile hemostat was used with fluoroscopic guidance to locate the targets for each level blocked. Once located, the skin was marked with an approved surgical skin marker. Once all sites were marked, the skin (epidermis, dermis, and hypodermis), as well as deeper tissues (fat, connective tissue and muscle) were infiltrated with a small amount of a short-acting local anesthetic, loaded on a 10cc syringe with a 25G, 1.5-in  Needle. An appropriate amount of time was allowed for local anesthetics to take effect before proceeding to the next step. Technical description of process:  Radiofrequency Ablation (RFA) L2 Medial Branch Nerve RFA: The target area for the L2 medial branch is at the junction of the postero-lateral aspect of the superior articular process and the superior, posterior, and medial edge of the transverse process of L3. Under fluoroscopic guidance, a Radiofrequency needle was inserted until contact was made with os over the superior postero-lateral aspect of the pedicular shadow (target area). Sensory and motor testing was conducted to properly adjust the position of the needle. Once satisfactory placement of the needle was achieved, the numbing solution was slowly injected after negative aspiration for blood. 2.0 mL of the nerve block solution was injected without difficulty or complication. After waiting for at least 3 minutes, the ablation was performed. Once completed, the needle was removed  intact. L3 Medial Branch Nerve RFA: The target area for the L3 medial branch is at the junction of the postero-lateral aspect of the superior articular process and the superior, posterior, and medial edge of the transverse process of L4. Under fluoroscopic guidance, a Radiofrequency needle was inserted until contact was made with os over the superior postero-lateral aspect of the pedicular shadow (target area). Sensory and motor testing was conducted to properly adjust the position of the needle. Once satisfactory placement of the needle was achieved, the numbing solution was slowly injected after negative aspiration for blood. 2.0 mL of the nerve block solution was injected without difficulty or complication. After waiting for at least 3 minutes, the ablation was performed. Once completed, the needle was removed intact. L4 Medial Branch  Nerve RFA: The target area for the L4 medial branch is at the junction of the postero-lateral aspect of the superior articular process and the superior, posterior, and medial edge of the transverse process of L5. Under fluoroscopic guidance, a Radiofrequency needle was inserted until contact was made with os over the superior postero-lateral aspect of the pedicular shadow (target area). Sensory and motor testing was conducted to properly adjust the position of the needle. Once satisfactory placement of the needle was achieved, the numbing solution was slowly injected after negative aspiration for blood. 2.0 mL of the nerve block solution was injected without difficulty or complication. After waiting for at least 3 minutes, the ablation was performed. Once completed, the needle was removed intact. L5 Medial Branch Nerve RFA: The target area for the L5 medial branch is at the junction of the postero-lateral aspect of the superior articular process of S1 and the superior, posterior, and medial edge of the sacral ala. Under fluoroscopic guidance, a Radiofrequency needle was inserted  until contact was made with os over the superior postero-lateral aspect of the pedicular shadow (target area). Sensory and motor testing was conducted to properly adjust the position of the needle. Once satisfactory placement of the needle was achieved, the numbing solution was slowly injected after negative aspiration for blood. 2.0 mL of the nerve block solution was injected without difficulty or complication. After waiting for at least 3 minutes, the ablation was performed. Once completed, the needle was removed intact.  Radiofrequency lesioning (ablation):  Radiofrequency Generator: Medtronic AccurianTM AG 1000 RF Generator Sensory Stimulation Parameters: 50 Hz was used to locate & identify the nerve, making sure that the needle was positioned such that there was no sensory stimulation below 0.3 V or above 0.7 V. Motor Stimulation Parameters: 2 Hz was used to evaluate the motor component. Care was taken not to lesion any nerves that demonstrated motor stimulation of the lower extremities at an output of less than 2.5 times that of the sensory threshold, or a maximum of 2.0 V. Lesioning Technique Parameters: Standard Radiofrequency settings. (Not bipolar or pulsed.) Temperature Settings: 80 degrees C Lesioning time: 60 seconds Stationary intra-operative compliance: Compliant  Once the entire procedure was completed, the treated area was cleaned, making sure to leave some of the prepping solution back to take advantage of its long term bactericidal properties.    Illustration of the posterior view of the lumbar spine and the posterior neural structures. Laminae of L2 through S1 are labeled. DPRL5, dorsal primary ramus of L5; DPRS1, dorsal primary ramus of S1; DPR3, dorsal primary ramus of L3; FJ, facet (zygapophyseal) joint L3-L4; I, inferior articular process of L4; LB1, lateral branch of dorsal primary ramus of L1; IAB, inferior articular branches from L3 medial branch (supplies L4-L5 facet joint);  IBP, intermediate branch plexus; MB3, medial branch of dorsal primary ramus of L3; NR3, third lumbar nerve root; S, superior articular process of L5; SAB, superior articular branches from L4 (supplies L4-5 facet joint also); TP3, transverse process of L3.  Facet Joint Innervation (* possible contribution)  L1-2 T12, L1 (L2*)  Medial Branch  L2-3 L1, L2 (L3*)         "          "  L3-4 L2, L3 (L4*)         "          "  L4-5 L3, L4 (L5*)         "          "  L5-S1 L4, L5, S1          "          "    Vitals:   05/10/23 1048 05/10/23 1054 05/10/23 1058 05/10/23 1105  BP: (!) 192/98 (!) 185/89 (!) 187/109 (!) 178/91  Pulse:      Resp: 13 16 14 14   Temp:      SpO2: 100% 100% 100% 99%  Weight:      Height:        Start Time: 1022 hrs. End Time: 1057 hrs.  Imaging Guidance (Spinal):          Type of Imaging Technique: Fluoroscopy Guidance (Spinal) Indication(s): Assistance in needle guidance and placement for procedures requiring needle placement in or near specific anatomical locations not easily accessible without such assistance. Exposure Time: Please see nurses notes. Contrast: None used. Fluoroscopic Guidance: I was personally present during the use of fluoroscopy. "Tunnel Vision Technique" used to obtain the best possible view of the target area. Parallax error corrected before commencing the procedure. "Direction-depth-direction" technique used to introduce the needle under continuous pulsed fluoroscopy. Once target was reached, antero-posterior, oblique, and lateral fluoroscopic projection used confirm needle placement in all planes. Images permanently stored in EMR. Interpretation: No contrast injected. I personally interpreted the imaging intraoperatively. Adequate needle placement confirmed in multiple planes. Permanent images saved into the patient's record.  Antibiotic Prophylaxis:   Anti-infectives (From admission, onward)    None      Indication(s): None  identified  Post-operative Assessment:  Post-procedure Vital Signs:  Pulse/HCG Rate: 6362 Temp: (!) 97.5 F (36.4 C) Resp: 14 BP: (!) 178/91 SpO2: 99 %  EBL: None  Complications: No immediate post-treatment complications observed by team, or reported by patient.  Note: The patient tolerated the entire procedure well. A repeat set of vitals were taken after the procedure and the patient was kept under observation following institutional policy, for this type of procedure. Post-procedural neurological assessment was performed, showing return to baseline, prior to discharge. The patient was provided with post-procedure discharge instructions, including a section on how to identify potential problems. Should any problems arise concerning this procedure, the patient was given instructions to immediately contact us, at any time, without hesitation. In any case, we plan to contact the patient by telephone for a follow-up status report regarding this interventional procedure.  Comments:  No additional relevant information.  Plan of Care (POC)  Orders:  Orders Placed This Encounter  Procedures   DG PAIN CLINIC C-ARM 1-60 MIN NO REPORT    Intraoperative interpretation by procedural physician at [facility].    Standing Status:   Standing    Number of Occurrences:   1    Order Specific Question:   Reason for exam:    Answer:   Assistance in needle guidance and placement for procedures requiring needle placement in or near specific anatomical locations not easily accessible without such assistance.   Chronic Opioid Analgesic:  Hydrocodone IR 10 mg BID-TID prn    Medications ordered for procedure: Meds ordered this encounter  Medications   lidocaine (XYLOCAINE) 2 % (with pres) injection 400 mg   lactated ringers infusion   midazolam (VERSED) injection 0.5-2 mg    Make sure Flumazenil is available in the pyxis when using this medication. If oversedation occurs, administer 0.2 mg IV  over 15 sec. If after 45 sec no response, administer 0.2 mg again over 1 min; may repeat at 1 min intervals; not to exceed  4 doses (1 mg)   dexamethasone (DECADRON) injection 10 mg   dexamethasone (DECADRON) injection 10 mg   ropivacaine (PF) 2 mg/mL (0.2%) (NAROPIN) injection 9 mL   ropivacaine (PF) 2 mg/mL (0.2%) (NAROPIN) injection 9 mL   Medications administered: We administered lidocaine, lactated ringers, midazolam, dexamethasone, dexamethasone, ropivacaine (PF) 2 mg/mL (0.2%), and ropivacaine (PF) 2 mg/mL (0.2%).  See the medical record for exact dosing, route, and time of administration.  Follow-up plan:   Return in about 6 weeks (around 06/21/2023), or PPE F2F.       Recent Visits Date Type Provider Dept  04/15/23 Office Visit Edward Jolly, MD [facility]  Showing recent visits within past 90 days and meeting all other requirements Today's Visits Date Type Provider Dept  05/10/23 Procedure visit Edward Jolly, MD [facility]  Showing today's visits and meeting all other requirements Future Appointments Date Type Provider Dept  06/21/23 Appointment Edward Jolly, MD [facility]  Showing future appointments within next 90 days and meeting all other requirements  Disposition: Discharge home  Discharge (Date  Time): 05/10/2023; 1110 hrs.   Primary Care Physician: Lorre Munroe, NP Location: [facility] Note by: Edward Jolly, MD (TTS technology used. I apologize for any typographical errors that were not detected and corrected.) Date: 05/10/2023; Time: 11:22 AM  Disclaimer:  Medicine is not an Visual merchandiser. The only guarantee in medicine is that nothing is guaranteed. It is important to note that the decision to proceed with this intervention was based on the information collected from the patient. The Data and conclusions were drawn from the patient's questionnaire, the interview, and the physical examination.  Because the information was provided in large part by the patient, it cannot be guaranteed that it has not been purposely or unconsciously manipulated. Every effort has been made to obtain as much relevant data as possible for this evaluation. It is important to note that the conclusions that lead to this procedure are derived in large part from the available data. Always take into account that the treatment will also be dependent on availability of resources and existing treatment guidelines, considered by other Pain Management Practitioners as being common knowledge and practice, at the time of the intervention. For Medico-Legal purposes, it is also important to point out that variation in procedural techniques and pharmacological choices are the acceptable norm. The indications, contraindications, technique, and results of the above procedure should only be interpreted and judged by a Board-Certified Interventional Pain Specialist with extensive familiarity and expertise in the same exact procedure and technique.

## 2023-05-11 ENCOUNTER — Encounter: Payer: Self-pay | Admitting: Internal Medicine

## 2023-05-11 ENCOUNTER — Telehealth: Payer: Self-pay

## 2023-05-11 NOTE — Telephone Encounter (Signed)
Please review.  KP

## 2023-05-17 ENCOUNTER — Other Ambulatory Visit: Payer: Self-pay | Admitting: Internal Medicine

## 2023-05-18 ENCOUNTER — Telehealth: Payer: Self-pay | Admitting: Pharmacist

## 2023-05-18 ENCOUNTER — Telehealth: Payer: Self-pay

## 2023-05-18 DIAGNOSIS — M47816 Spondylosis without myelopathy or radiculopathy, lumbar region: Secondary | ICD-10-CM

## 2023-05-18 DIAGNOSIS — G894 Chronic pain syndrome: Secondary | ICD-10-CM

## 2023-05-18 MED ORDER — VENLAFAXINE HCL ER 75 MG PO CP24: 75.0000 mg | ORAL_CAPSULE | Freq: Every day | ORAL | 1 refills | Status: DC

## 2023-05-18 MED ORDER — LIDOCAINE 5 % EX PTCH: 1.0000 | MEDICATED_PATCH | Freq: Two times a day (BID) | CUTANEOUS | 2 refills | Status: DC

## 2023-05-18 NOTE — Telephone Encounter (Signed)
Patient notified

## 2023-05-18 NOTE — Telephone Encounter (Signed)
Called to see if patient is tolerating Praluent well. Hx of intolerance to Repatha.   Spoke to wife, stated he has tried one dose so far, got mild headache. however,he is going through lots of other health problem so, thinking headache can be form something else his next dose is due next week. He will inform us if he is unable to tolerate Praluent.

## 2023-05-18 NOTE — Telephone Encounter (Signed)
He called and left a vm wanting to know if he can get the lidocaine patches. He states they discussed last Monday but her has not heard anything. Also, he said Dr. Cherylann Ratel was going to refer him to his cousin, a kidney doctor and he has heard nothing about that.

## 2023-05-19 ENCOUNTER — Ambulatory Visit: Payer: Medicare Other | Admitting: Urology

## 2023-05-19 NOTE — Telephone Encounter (Signed)
Unable to refill per protocol, Rx request was refilled 05/18/23, duplicate request.  Requested Prescriptions  Pending Prescriptions Disp Refills   venlafaxine XR (EFFEXOR-XR) 75 MG 24 hr capsule [Pharmacy Med Name: VENLAFAXINE ER 75MG  CAPSULES] 90 capsule 1    Sig: TAKE 1 CAPSULE(75 MG) BY MOUTH DAILY WITH BREAKFAST     Psychiatry: Antidepressants - SNRI - desvenlafaxine & venlafaxine Failed - 05/17/2023 11:40 AM      Failed - Last BP in normal range    BP Readings from Last 1 Encounters:  05/10/23 (!) 178/91         Failed - Lipid Panel in normal range within the last 12 months    Cholesterol  Date Value Ref Range Status  03/26/2023 164 0 - 200 mg/dL Final   LDL Cholesterol (Calc)  Date Value Ref Range Status  11/11/2021 103 (H) mg/dL (calc) Final    Comment:    Reference range: <100 . Desirable range <100 mg/dL for primary prevention;   <70 mg/dL for patients with CHD or diabetic patients  with > or = 2 CHD risk factors. Marland Kitchen LDL-C is now calculated using the Martin-Hopkins  calculation, which is a validated novel method providing  better accuracy than the Friedewald equation in the  estimation of LDL-C.  Horald Pollen et al. Lenox Ahr. 4782;956(21): 2061-2068  (http://education.[employer].com/faq/FAQ164)    LDL Cholesterol  Date Value Ref Range Status  03/26/2023 101 (H) 0 - 99 mg/dL Final    Comment:           Total Cholesterol/HDL:CHD Risk Coronary Heart Disease Risk Table                     Men   Women  1/2 Average Risk   3.4   3.3  Average Risk       5.0   4.4  2 X Average Risk   9.6   7.1  3 X Average Risk  23.4   11.0        Use the calculated Patient Ratio above and the CHD Risk Table to determine the patient's CHD Risk.        ATP III CLASSIFICATION (LDL):  <100     mg/dL   Optimal  308-657  mg/dL   Near or Above                    Optimal  130-159  mg/dL   Borderline  846-962  mg/dL   High  >952     mg/dL   Very High Performed at [facility],  [address] [place], [place] [address]    Direct LDL  Date Value Ref Range Status  01/30/2022 98.5 0 - 99 mg/dL Final    Comment:    Performed at [facility], [address] [place], [place] [address]   HDL  Date Value Ref Range Status  03/26/2023 49 >40 mg/dL Final   Triglycerides  Date Value Ref Range Status  03/26/2023 70 <150 mg/dL Final         Passed - Cr in normal range and within 360 days    Creat  Date Value Ref Range Status  09/18/2022 0.76 0.70 - 1.28 mg/dL Final   Creatinine, Ser  Date Value Ref Range Status  04/20/2023 0.80 0.61 - 1.24 mg/dL Final         Passed - Completed PHQ-2 or PHQ-9 in the last 360 days      Passed -  Valid encounter within last 6 months    Recent Outpatient Visits           1 month ago Acute encephalopathy   [place] [facility] [place], Salvadore Oxford, NP   2 months ago Acute non-recurrent pansinusitis   [name] [facility] [place], [place] W, NP   6 months ago Coronary artery disease involving native coronary artery of native heart without angina pectoris   [facility] [place], Salvadore Oxford, NP   6 months ago Essential hypertension   [place] [facility] [place], Gentry Fitz A, RPH-CPP   8 months ago Generalized abdominal pain   [place] [facility] [place], Salvadore Oxford, NP       Future Appointments             Tomorrow Richardo Hanks, Laurette Schimke, MD [facility] [place]   In 1 week Wyline Mood, MD [facility] [place]   In 1 month Dunn, Raymon Mutton, PA-C [facility] [place]   In 5 months [name], Salvadore Oxford, NP [place] [facility], [facility]

## 2023-05-20 ENCOUNTER — Ambulatory Visit (INDEPENDENT_AMBULATORY_CARE_PROVIDER_SITE_OTHER): Payer: Medicare Other | Admitting: Urology

## 2023-05-20 ENCOUNTER — Encounter: Payer: Self-pay | Admitting: Internal Medicine

## 2023-05-20 VITALS — BP 174/92 | HR 72 | Ht 69.0 in | Wt 139.4 lb

## 2023-05-20 DIAGNOSIS — N529 Male erectile dysfunction, unspecified: Secondary | ICD-10-CM | POA: Diagnosis not present

## 2023-05-20 DIAGNOSIS — N400 Enlarged prostate without lower urinary tract symptoms: Secondary | ICD-10-CM

## 2023-05-20 DIAGNOSIS — Z125 Encounter for screening for malignant neoplasm of prostate: Secondary | ICD-10-CM

## 2023-05-20 DIAGNOSIS — N3281 Overactive bladder: Secondary | ICD-10-CM

## 2023-05-20 LAB — BLADDER SCAN AMB NON-IMAGING: Scan Result: 54

## 2023-05-20 MED ORDER — NONFORMULARY OR COMPOUNDED ITEM: 6 refills | Status: DC

## 2023-05-20 NOTE — Progress Notes (Signed)
   05/20/2023 10:27 AM   Grant Young 1946-02-10 [id number]  Reason for visit: Follow up ED, OAB  HPI: I saw [name] back for discussion of ED treatment and OAB.  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  He has a history of OAB on Gemtesa, however recently discontinued his diuretic and no longer felt like he needed the [place].  PVR today is normal at 50 mL and he denies any urinary complaints..  In February 2022 he underwent a cystoscopy and TRUS for worsening urinary frequency which showed a wide open prostatic fossa with no evidence of obstruction, and calculated prostate volume of 15 g.  He has an allergy to alprostadil, so he cannot use Trimix.  He currently is using the Bimix in addition to a penile ring to achieve a sufficient erection.  He feels like the erection is not quite as hard as he would like, but it lasts at about 50% firmness for at least a few hours.  I again cautioned him on the risk of priapism with penile injections.  He is on testosterone replacement and management through an outside functional medicine MD in [place].  He requested a PSA today, and we will forward those results to him.  He is on a number of medications that negatively impact erections including propanolol, venlafaxine, and Valium.  Bi-Mix refilled, he preferred a local pharmacy.  Okay to change back to [place] pharmacy in the future if he prefers Will forward PSA results to his functional medicine MD managing his testosterone RTC 1 year PVR   Sondra Come, MD  [facility] [address] [place], [place] [address] [phone]

## 2023-05-21 ENCOUNTER — Other Ambulatory Visit: Payer: Self-pay | Admitting: Internal Medicine

## 2023-05-21 LAB — PSA: Prostate Specific Ag, Serum: 0.4 ng/mL (ref 0.0–4.0)

## 2023-05-21 NOTE — Telephone Encounter (Signed)
Requested medication (s) are due for refill today:   Requested medication (s) are on the active medication list: Yes  Last refill:  04/20/23  Future visit scheduled: No  Notes to clinic:  Last filled by Dr. Lenard Lance.    Requested Prescriptions  Pending Prescriptions Disp Refills   dicyclomine (BENTYL) 10 MG capsule [Pharmacy Med Name: DICYCLOMINE 10 MG CAPSULE] 90 capsule 1    Sig: TAKE 1 CAPSULE (10 MG TOTAL) BY MOUTH 4 TIMES A DAY BEFORE MEALS AND AT BEDTIME     Gastroenterology:  Antispasmodic Agents Passed - 05/21/2023  1:35 AM      Passed - Valid encounter within last 12 months    Recent Outpatient Visits           1 month ago Acute encephalopathy   [place] [facility] [place], Salvadore Oxford, NP   2 months ago Acute non-recurrent pansinusitis   [place] [facility] [place], [place] W, NP   6 months ago Coronary artery disease involving native coronary artery of native heart without angina pectoris   [facility] [facility] [place], Salvadore Oxford, NP   6 months ago Essential hypertension   [place] [facility] [place], Gentry Fitz A, RPH-CPP   8 months ago Generalized abdominal pain   [place] [facility] [place], Salvadore Oxford, NP       Future Appointments             In 6 days Wyline Mood, MD [facility] [place] [facility] [place]   In 1 month Dunn, Raymon Mutton, PA-C [facility] [place]   In 5 months [name], Salvadore Oxford, NP  [facility], [facility]   In 1 year [place], Laurette Schimke, MD [facility] [place]

## 2023-05-22 ENCOUNTER — Encounter: Payer: Self-pay | Admitting: Cardiology

## 2023-05-22 NOTE — Assessment & Plan Note (Signed)
His blood pressure ranges pretty dramatic.  Very difficult to tell what he is actually taking.  Seems to be doing better with higher doses of twice daily propranolol, but heart rates are varying.  Plan: Start Taking Hydralazine 25 mg twice a day in the morning and at dinner.  May take an extra dose for blood pressure greater than 160. Propanolol 60 mg twice a day at lunch and bedtime

## 2023-05-22 NOTE — Assessment & Plan Note (Addendum)
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  Also Repatha.  Will try to have him restart Praluent. => Can reassess lipids several months after starting Praluent.  If not able to tolerate Praluent but the other remaining option would be inclisiran.  Wanted him to ensure that he alternates weeks between taking testosterone and Praluent to avoid overlap

## 2023-05-22 NOTE — Assessment & Plan Note (Addendum)
Despite having all these blood pressure issues she is not having any active cardiac anginal symptoms.  No CHF symptoms.  Plan: Although not favorable as the beta-blocker of choice, that the only tolerated like beta-blocker has been propranolol. => He has been adjusting his own medications.  His pressures seem to be doing relatively well right now. Will make propranolol 60 mg twice daily at lunch and bedtime Continue Zetia and restart Praluent. Continue Plavix.  Okay to hold Plavix 5 to 7 days preop for surgeries or procedures.

## 2023-05-22 NOTE — Assessment & Plan Note (Addendum)
Has been intolerant of multiple different statins (atorvastatin, rosuvastatin, pravastatin and simvastatin).  Noted myalgias and headaches.  Also had strange symptoms with Repatha citing headaches and nausea. He has tolerated Zetia and was recently switched over to Praluent although he started noting symptoms on Praluent but is willing to restart.  If Praluent does not work, then I think the only other option would be considering inclisiran.

## 2023-05-27 ENCOUNTER — Ambulatory Visit (INDEPENDENT_AMBULATORY_CARE_PROVIDER_SITE_OTHER): Payer: Medicare Other | Admitting: Gastroenterology

## 2023-05-27 ENCOUNTER — Encounter: Payer: Self-pay | Admitting: Gastroenterology

## 2023-05-27 ENCOUNTER — Other Ambulatory Visit: Payer: Self-pay

## 2023-05-27 VITALS — BP 138/74 | HR 67 | Temp 98.1°F | Wt 140.8 lb

## 2023-05-27 DIAGNOSIS — R1013 Epigastric pain: Secondary | ICD-10-CM | POA: Diagnosis not present

## 2023-05-27 DIAGNOSIS — K9089 Other intestinal malabsorption: Secondary | ICD-10-CM | POA: Diagnosis not present

## 2023-05-27 MED ORDER — AMITRIPTYLINE HCL 10 MG PO TABS: 10.0000 mg | ORAL_TABLET | Freq: Every day | ORAL | 0 refills | Status: DC

## 2023-05-27 NOTE — Progress Notes (Signed)
Wyline Mood MD, MRCP(U.K) [address]  [address]  [place], [place] [address]  Main: [phone]  Fax: [phone]   Primary Care Physician: Lorre Munroe, NP  Primary Gastroenterologist:  Dr. Wyline Mood   Chief Complaint  Patient presents with   Gastroesophageal Reflux    HPI: Grant Young is a 77 y.o. male Summary of history :   He was initially referred and seen in February 2021 for GERD.    He has had his gallbladder taken out in 2017, has a history of diarrhea, has been advised to try cholestyramine which she has not tried in the past..  He has been evaluated in the past by a gastroenterologist in [place] and underwent an upper endoscopy per his recollection which he recalls was normal except for a few small polyps.  He has also had a colonoscopy in the past.At his initial visit felt that his symptoms are suggestive of dyspepsia.  AcipHex which he was on was giving him good control of his symptoms.  Plan was to try him on peppermint oil capsules which he was provided samples of which.    In the past 2022 has been seen for elevated alkaline phosphatase with normal, GGT indicating that the elevated alkaline phosphatase is not related to his liver full autoimmune and viral hepatitis workup has been negative.    10/03/2020: EGD: Normal study, colonoscopy: Internal hemorrhoids noted medium in size.  No polyps seen random colon biopsies taken which were negative for microscopic colitis.  Biopsies of the terminal ileum were also normal.   3 rounds of hemorrhoidal banding completed in October 2022. In October 2023 was seen for constipation as he been taking Norco as well as reflux.  We increase his Aciphex to twice a day and we tried Linzess 72 mcg but it was too much for him because diarrhea and he called our office and we changed it to MiraLAX   Interval history 06/16/2022-05/27/2023   Since his last visit constipation is resolved since he stopped taking the Norco.  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  He still complains of epigastric discomfort that he has had in the past which is relieved after eating food lasting a few minutes.  Nonradiating sharp in nature.   Current Outpatient Medications  Medication Sig Dispense Refill   Alirocumab (PRALUENT) 75 MG/ML SOAJ Inject 1 mL (75 mg total) into the skin every 14 (fourteen) days. 2 mL 2   anastrozole (ARIMIDEX) 1 MG tablet 1/2 tablet (0.5 mg) by mouth once weekly     Butalbital-APAP-Caffeine 50-300-40 MG CAPS Take 1 capsule by mouth every 6 (six) hours as needed. 90 capsule 0   cetirizine (ZYRTEC) 10 MG tablet Take 10 mg by mouth daily as needed for allergies.     Cholecalciferol 50 MCG (2000 UT) CAPS Take 1 tablet by mouth daily.     clopidogrel (PLAVIX) 75 MG tablet TAKE 1 TABLET BY MOUTH ONCE DAILY. 90 tablet 2   Cyanocobalamin (VITAMIN B-12) 5000 MCG LOZG Take 1 lozenge by mouth daily.      DHEA 25 MG CAPS Take 25 mg by mouth daily.     diazepam (VALIUM) 5 MG tablet TAKE 1 TABLET IN THE MORNING AND 2 TABLETS AT BEDTIME 90 tablet 0   dicyclomine (BENTYL) 10 MG capsule TAKE 1 CAPSULE (10 MG TOTAL) BY MOUTH 4 TIMES A DAY BEFORE MEALS AND AT BEDTIME 90 capsule 1   Digestive Aids Mixture (DIGESTION  GB PO) Take 1 capsule by mouth daily.     ezetimibe (ZETIA) 10 MG tablet TAKE 1 TABLET BY MOUTH EVERY DAY 90 tablet 2   hydrALAZINE (APRESOLINE) 25 MG tablet Take 1 tablet (25 mg total) by mouth 2 (two) times daily. In the morning and at dinner, may take extra tablet for BP greater than 160 180 tablet 3   ipratropium (ATROVENT) 0.06 % nasal spray Place 1 spray into both nostrils 4 (four) times daily as needed for rhinitis. For up to 5-7 days then stop. 15 mL 0   Loperamide HCl (IMODIUM PO) Take by mouth as needed.      MAGNESIUM BISGLYCINATE PO Take by mouth daily.     melatonin 5 MG TABS Take 5 mg by mouth at bedtime.     NONFORMULARY OR COMPOUNDED ITEM Bi-Mix Papaverine 30mg , Phentolamine  1mg    Dosage: Inject 0.25cc-0.5cc as need for ED   Vial 1ml   Qty #5 Refills 6 5 each 6   Nutritional Supplements (NUTRITIONAL SUPPLEMENT PO) Take 1 capsule by mouth daily. [name]     POTASSIUM PO Take 1 tablet by mouth as directed. Takes 5x weekly.     Probiotic Product (PROBIOTIC DAILY PO) Take by mouth daily.     prochlorperazine (COMPAZINE) 10 MG tablet Take 1 tablet (10 mg total) by mouth 2 (two) times daily as needed for nausea or vomiting. 180 tablet 0   propranolol ER (INDERAL LA) 60 MG 24 hr capsule Take 1 capsule (60 mg total) by mouth 2 (two) times daily. At [facility] and Bedtime 180 capsule 3   pyridostigmine (MESTINON) 60 MG tablet TAKE 0.5 TABLETS (30 MG TOTAL) BY MOUTH 2 (TWO) TIMES DAILY. 90 tablet 0   RABEprazole (ACIPHEX) 20 MG tablet TAKE 1 TABLET BY MOUTH TWICE A DAY 180 tablet 3   testosterone cypionate (DEPOTESTOSTERONE CYPIONATE) 200 MG/ML injection Inject 80 mg into the muscle every 14 (fourteen) days.     valACYclovir (VALTREX) 1000 MG tablet Take 2 tabs p.o. and repeat in 12 hours as needed for cold sore 30 tablet 0   sucralfate (CARAFATE) 1 g tablet Take 1 tablet (1 g total) by mouth 4 (four) times daily for 15 days. 60 tablet 0   No current facility-administered medications for this visit.    Allergies as of 05/27/2023 - Review Complete 05/27/2023  Allergen Reaction Noted   Ace inhibitors  08/24/2019   Fluoxetine Anxiety 10/31/2019   Metoclopramide  10/31/2019   Nalbuphine  10/31/2019   Other  01/03/2014   Amoxicillin-pot clavulanate Nausea Only 10/31/2019   Doxazosin Rash 02/01/2013   Duloxetine Nausea Only 10/31/2019   Penicillins Nausea Only 10/31/2019   Tamsulosin Itching and Anxiety 02/01/2013   Trazodone and nefazodone Itching, Anxiety, and Rash 02/13/2012   Amlodipine  10/31/2019   Cinoxacin  10/31/2019   Ciprofloxacin  08/24/2019   Fludrocortisone Other (See Comments) 08/04/2021   Nebivolol  08/24/2019   Olanzapine  10/31/2019    Olmesartan  08/24/2019   Pregabalin  10/31/2019   Prostaglandins  05/07/2014   Thyroid hormones  06/09/2018   Zolpidem  10/31/2019   Duloxetine hcl  01/03/2014   Fluoxetine hcl  01/03/2014   Nucynta [tapentadol] Other (See Comments) 07/15/2021   Phenytoin Anxiety 10/31/2019         ROS:  General: Negative for anorexia, weight loss, fever, chills, fatigue, weakness. ENT: Negative for hoarseness, difficulty swallowing , nasal congestion. CV: Negative for chest pain, angina, palpitations, dyspnea on exertion,  peripheral edema.  Respiratory: Negative for dyspnea at rest, dyspnea on exertion, cough, sputum, wheezing.  GI: See history of present illness. GU:  Negative for dysuria, hematuria, urinary incontinence, urinary frequency, nocturnal urination.  Endo: Negative for unusual weight change.    Physical Examination:   BP 138/74   Pulse 67   Temp 98.1 F (36.7 C) (Oral)   Wt 140 lb 12.8 oz (63.9 kg)   BMI 20.79 kg/m   General: Well-nourished, well-developed in no acute distress.  Eyes: No icterus. Conjunctivae pink. Abdomen: Bowel sounds are normal, nontender, nondistended, no hepatosplenomegaly or masses, no abdominal bruits or hernia , no rebound or guarding.   Extremities: No lower extremity edema. No clubbing or deformities. Neuro: Alert and oriented x 3.  Grossly intact. Skin: Warm and dry, no jaundice.   Psych: Alert and cooperative, normal mood and affect.   Imaging Studies: DG PAIN CLINIC C-ARM 1-60 MIN NO REPORT  Result Date: 05/10/2023 Fluoro was used, but no Radiologist interpretation will be provided. Please refer to "NOTES" tab for provider progress note.   Assessment and Plan:   JELAN BOWSHER is a 77 y.o. y/o male who has had a prior history of bile salt mediated diarrhea.  He has had dyspeptic symptoms in the past possibly irritable bowel syndrome.  Workup has been negative so far.  Suggested a trial of amitriptyline to be taken at night 10 mg which he  is willing to try.  He has failed treatment with dicyclomine, peppermint oil in the past.  No other red flag signs.  He is extremely concerned about his symptoms I also suggested we could do an upper endoscopy to rule out any ulcers as the pain gets better with the intake of food.   I have discussed alternative options, risks & benefits,  which include, but are not limited to, bleeding, infection, perforation,respiratory complication & drug reaction.  The patient agrees with this plan & written consent will be obtained.       Dr Wyline Mood  MD,MRCP [facility]) Follow up in 8-12 weeks

## 2023-05-27 NOTE — Patient Instructions (Addendum)
Please start taking Amitriptyline 10 MG once a day at night time for 3 months. This was sent to your pharmacy. We will schedule you an upper endoscopy so could have it done in 2 months. Follow up in 3 months.

## 2023-05-28 ENCOUNTER — Ambulatory Visit: Payer: Medicare Other | Admitting: Internal Medicine

## 2023-05-28 ENCOUNTER — Other Ambulatory Visit: Payer: Self-pay | Admitting: *Deleted

## 2023-05-28 ENCOUNTER — Telehealth: Payer: Self-pay | Admitting: Cardiology

## 2023-05-28 DIAGNOSIS — N529 Male erectile dysfunction, unspecified: Secondary | ICD-10-CM

## 2023-05-28 MED ORDER — NONFORMULARY OR COMPOUNDED ITEM
6 refills | Status: AC
Start: 2023-05-28 — End: ?

## 2023-05-28 NOTE — Telephone Encounter (Signed)
Name: Grant Young  DOB: [birth year]  MRN: [id number]   Primary Cardiologist: Bryan Lemma, MD  Chart reviewed as part of pre-operative protocol coverage. Patient was contacted 05/28/2023 in reference to pre-operative risk assessment for pending surgery as outlined below.  Grant Young was last seen on 05/06/2023 by Dr. Herbie Baltimore.  Since that day, Grant Young has done cardiac standpoint.  His BP has improved significantly since stopping Effexor.  He denies any new symptoms or concerns.  He is able to complete greater than 4 METS without difficulty.  Therefore, based on ACC/AHA guidelines, the patient would be at acceptable risk for the planned procedure without further cardiovascular testing.   Per Dr. Herbie Baltimore, he may hold Plavix for 5 days prior to procedure.  Please resume Plavix as soon as possible postprocedure, the discretion of the surgeon  The patient was advised that if he develops new symptoms prior to surgery to contact our office to arrange for a follow-up visit, and he verbalized understanding.  I will route this recommendation to the requesting party via Epic fax function and remove from pre-op pool. Please call with questions.  Joylene Grapes, NP 05/28/2023, 12:40 PM

## 2023-05-28 NOTE — Telephone Encounter (Signed)
[facility] for [facility]-Mix, will print and fax again.  Rx printed and faxed

## 2023-05-28 NOTE — Telephone Encounter (Signed)
Patient Name: Grant Young  DOB: 1946/03/06 MRN: [id number]  Primary Cardiologist: Bryan Lemma, MD  Chart reviewed as part of pre-operative protocol coverage.    Attempted to contact patient to discuss surgical clearance request/advice on holding Plavix prior to upcoming EGD. Unable to reach patient, left voicemail requesting patient return call at earliest convenience. If patient is without new symptoms/concern, he may hold Plavix for 5 days pre-procedure per last office visit note with Dr. Herbie Baltimore.   [name], PA-C 05/28/2023, 11:18 AM

## 2023-05-28 NOTE — Telephone Encounter (Signed)
Pre-operative Risk Assessment    Patient Name: Grant Young  DOB: 08-01-1946 MRN: [id number]      Request for Surgical Clearance    Procedure:  EGD  Date of Surgery:  Clearance 07/27/23                                 Surgeon:  Dr. Katy Apo Group or Practice Name:  [facility] Phone number:  [phone] Fax number:  [phone]   Type of Clearance Requested:   - Pharmacy:  Hold Clopidogrel (Plavix)     Type of Anesthesia:  Not Indicated   Additional requests/questions:    Signed, Narda Amber   05/28/2023, 8:39 AM

## 2023-05-31 ENCOUNTER — Telehealth: Payer: Self-pay

## 2023-05-31 NOTE — Telephone Encounter (Signed)
Per Dr. Herbie Baltimore, he may hold Plavix for 5 days prior to procedure.  Please resume Plavix as soon as possible postprocedure, the discretion of the surgeon   Called and left a message for call back for patient

## 2023-06-01 MED ORDER — DIAZEPAM 5 MG PO TABS: ORAL_TABLET | ORAL | 0 refills | Status: DC

## 2023-06-01 NOTE — Telephone Encounter (Signed)
Patient verbalized understanding and states cardiologist  has already informed him of this information

## 2023-06-21 ENCOUNTER — Other Ambulatory Visit: Payer: Self-pay | Admitting: Student in an Organized Health Care Education/Training Program

## 2023-06-21 ENCOUNTER — Ambulatory Visit
Payer: Worker's Compensation | Attending: Student in an Organized Health Care Education/Training Program | Admitting: Student in an Organized Health Care Education/Training Program

## 2023-06-21 ENCOUNTER — Encounter: Payer: Self-pay | Admitting: Student in an Organized Health Care Education/Training Program

## 2023-06-21 VITALS — BP 174/70 | HR 65 | Temp 97.2°F | Resp 16 | Ht 69.0 in | Wt 139.0 lb

## 2023-06-21 DIAGNOSIS — G894 Chronic pain syndrome: Secondary | ICD-10-CM | POA: Diagnosis present

## 2023-06-21 DIAGNOSIS — M47816 Spondylosis without myelopathy or radiculopathy, lumbar region: Secondary | ICD-10-CM | POA: Diagnosis present

## 2023-06-21 DIAGNOSIS — M19029 Primary osteoarthritis, unspecified elbow: Secondary | ICD-10-CM

## 2023-06-21 NOTE — Progress Notes (Signed)
Patient complaining of increased pain along his olecranon bursa and medial epicondyle.  This is tender to palpation.  We discussed a bilateral elbow steroid joint injection.  Risk and benefits reviewed and patient like to proceed.  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

## 2023-06-21 NOTE — Progress Notes (Signed)
PROVIDER NOTE: Information contained herein reflects review and annotations entered in association with encounter. Interpretation of such information and data should be left to medically-trained personnel. Information provided to patient can be located elsewhere in the medical record under "Patient Instructions". Document created using STT-dictation technology, any transcriptional errors that may result from process are unintentional.    Patient: Grant Young  Service Category: E/M  Provider: Edward Jolly, MD  DOB: June 03, 1946  DOS: 06/21/2023  Referring Provider: Lorre Munroe, NP  MRN: [id number]  Specialty: Interventional Pain Management  PCP: Lorre Munroe, NP  Type: Established Patient  Setting: Ambulatory outpatient    Location: Office  Delivery: Face-to-face     HPI  Grant Young, a 77 y.o. year old male, is here today because of his Lumbar facet arthropathy [M47.816]. Grant Young primary complain today is Back Pain (lower)  Pertinent problems: Grant Young has Lumbar facet arthropathy and Chronic pain syndrome on their pertinent problem list. Pain Assessment: Severity of Chronic pain is reported as a 5 /10. Location: Back Lower/ . Onset: More than a month ago. Quality: Aching, Discomfort, Nagging, Restless, Sharp. Timing: Intermittent. Modifying factor(s): walking, stytreching, different leg exercises, ice heat. Vitals:  height is 5\' 9"  (1.753 m) and weight is 139 lb (63 kg). His temperature is 97.2 F (36.2 C) (abnormal). His blood pressure is 174/70 (abnormal) and his pulse is 65. His respiration is 16 and oxygen saturation is 100%.  BMI: Estimated body mass index is 20.53 kg/m as calculated from the following:   Height as of this encounter: 5\' 9"  (1.753 m).   Weight as of this encounter: 139 lb (63 kg). Last encounter: 04/15/2023. Last procedure: 05/10/2023.  Reason for encounter: post-procedure evaluation and assessment.    Post-procedure evaluation   Procedure: Lumbar Facet,  Medial Branch Radiofrequency Ablation (RFA) #1  Laterality: Bilateral (-50)  Level: L2, L3, L4, and L5 Medial Branch Level(s). These levels will denervate the L2-3, L3-4, and L4-5 lumbar facet joints.  Imaging: Fluoroscopy-guided         Anesthesia: Local anesthesia (1-2% Lidocaine) Anxiolysis:IV Versed DOS: 05/10/2023  Performed by: Edward Jolly, MD  Purpose: Therapeutic/Palliative Indications: Low back pain severe enough to impact quality of life or function. Indications: 1. Lumbar facet arthropathy   2. Lumbar spondylosis   3. Chronic pain syndrome    Grant Young has been dealing with the above chronic pain for longer than three months and has either failed to respond, was unable to tolerate, or simply did not get enough benefit from other more conservative therapies including, but not limited to: 1. Over-the-counter medications 2. Anti-inflammatory medications 3. Muscle relaxants 4. Membrane stabilizers 5. Opioids 6. Physical therapy and/or chiropractic manipulation 7. Modalities (Heat, ice, etc.) 8. Invasive techniques such as nerve blocks. Grant Young has attained more than 50% relief of the pain from a series of diagnostic injections conducted in separate occasions.  Pain Score: Pre-procedure: 8 /10 Post-procedure: 6 /10     Effectiveness:  Initial hour after procedure: 50 %  Subsequent 4-6 hours post-procedure: 0 %  Analgesia past initial 6 hours: 40 % (current)  Ongoing improvement:  Analgesic:  40% Function: Somewhat improved ROM: Somewhat improved   Pharmacotherapy Assessment  Analgesic: Hydrocodone IR 10 mg BID-TID prn    Monitoring: [place] PMP: PDMP not reviewed this encounter.       Pharmacotherapy: No side-effects or adverse reactions reported. Compliance: No problems identified. Effectiveness: Clinically acceptable.  No notes on file  No results  found for: "CBDTHCR" No results found for: "D8THCCBX" No results found for: "D9THCCBX"  UDS:  Summary  Date  Value Ref Range Status  09/17/2020 Note  Final    Comment:    ==================================================================== Compliance Drug Analysis, Ur ==================================================================== Specimen Alert Note: Urinary creatinine is low; ability to detect some drugs may be compromised. Interpret results with caution. (Creatinine) ==================================================================== Test                             Result       Flag       Units  Drug Present and Declared for Prescription Verification   Lorazepam                      1107         EXPECTED   ng/mg creat    Source of lorazepam is a scheduled prescription medication.    Butalbital                     PRESENT      EXPECTED   Citalopram                     PRESENT      EXPECTED   Desmethylcitalopram            PRESENT      EXPECTED    Desmethylcitalopram is an expected metabolite of citalopram or the    enantiomeric form, escitalopram.    Acetaminophen                  PRESENT      EXPECTED   Propranolol                    PRESENT      EXPECTED  Drug Present not Declared for Prescription Verification   Oxazepam                       300          UNEXPECTED ng/mg creat   Temazepam                      357          UNEXPECTED ng/mg creat    Oxazepam and temazepam are expected metabolites of diazepam.    Oxazepam is also an expected metabolite of other benzodiazepine    drugs, including chlordiazepoxide, prazepam, clorazepate, halazepam,    and temazepam.  Oxazepam and temazepam are available as scheduled    prescription medications.  Drug Absent but Declared for Prescription Verification   Hydrocodone                    Not Detected UNEXPECTED ng/mg creat   Tizanidine                     Not Detected UNEXPECTED    Tizanidine, as indicated in the declared medication list, is not    always detected even when used as directed.    Amitriptyline                  Not  Detected UNEXPECTED   Prochlorperazine               Not Detected UNEXPECTED   Salicylate  Not Detected UNEXPECTED    Aspirin, as indicated in the declared medication list, is not always    detected even when used as directed.  ==================================================================== Test                      Result    Flag   Units      Ref Range   Creatinine              14        LL     mg/dL      >=16 ==================================================================== Declared Medications:  The flagging and interpretation on this report are based on the  following declared medications.  Unexpected results may arise from  inaccuracies in the declared medications.   **Note: The testing scope of this panel includes these medications:   Amitriptyline (Elavil)  Butalbital (Fioricet)  Butalbital (Fiorinal)  Escitalopram (Lexapro)  Hydrocodone (Norco)  Lorazepam (Ativan)  Prochlorperazine (Compazine)  Propranolol (Inderal)   **Note: The testing scope of this panel does not include small to  moderate amounts of these reported medications:   Acetaminophen (Fioricet)  Acetaminophen (Norco)  Aspirin (Fiorinal)  Tizanidine (Zanaflex)   **Note: The testing scope of this panel does not include the  following reported medications:   Anastrozole (Arimidex)  Caffeine (Fioricet)  Caffeine (Fiorinal)  Chlorthalidone (Hygroton)  Cholecalciferol  Cholestyramine (Questran)  Dicyclomine (Bentyl)  Ezetimibe (Zetia)  Hydrocortisone  Melatonin  Ondansetron (Zofran)  Potassium (Klor-Con)  Rabeprazole (Aciphex)  Sucralfate (Carafate)  Valacyclovir (Valtrex)  Vitamin B12 ==================================================================== For clinical consultation, please call [phone]. ====================================================================       ROS  Constitutional: Denies any fever or chills Gastrointestinal: No reported  hemesis, hematochezia, vomiting, or acute GI distress Musculoskeletal:  Improvement in low back pain Neurological: No reported episodes of acute onset apraxia, aphasia, dysarthria, agnosia, amnesia, paralysis, loss of coordination, or loss of consciousness  Medication Review  Alirocumab, Butalbital-APAP-Caffeine, Cholecalciferol, DHEA, Digestive Aids Mixture, Loperamide HCl, Magnesium Bisglycinate, NONFORMULARY OR COMPOUNDED ITEM, Nutritional Supplements, Potassium, Probiotic Product, RABEprazole, Vitamin B-12, amitriptyline, anastrozole, cetirizine, clopidogrel, diazepam, dicyclomine, escitalopram, ezetimibe, hydrALAZINE, ipratropium, melatonin, prochlorperazine, propranolol ER, pyridostigmine, sucralfate, testosterone cypionate, and valACYclovir  History Review  Allergy: Grant Young is allergic to ace inhibitors, fluoxetine, metoclopramide, nalbuphine, other, amoxicillin-pot clavulanate, doxazosin, duloxetine, penicillins, tamsulosin, trazodone and nefazodone, amlodipine, cinoxacin, ciprofloxacin, fludrocortisone, nebivolol, olanzapine, olmesartan, pregabalin, prostaglandins, thyroid hormones, zolpidem, duloxetine hcl, fluoxetine hcl, nucynta [tapentadol], and phenytoin. Drug: Grant Young  reports that he does not currently use drugs. Alcohol:  reports current alcohol use. Tobacco:  reports that he has never smoked. He has never been exposed to tobacco smoke. He has never used smokeless tobacco. Social: Grant Young  reports that he has never smoked. He has never been exposed to tobacco smoke. He has never used smokeless tobacco. He reports current alcohol use. He reports that he does not currently use drugs. Medical:  has a past medical history of Anemia, Aneurysm of ascending aorta (HCC), Anxiety, Aortic atherosclerosis (HCC), Chronic back pain, Coronary artery disease, Depression, Erectile dysfunction, GERD (gastroesophageal reflux disease), h/o Lyme disease, Headache, Hiatal hernia, History of bilateral  cataract extraction (01/2021), History of gastritis, History of kidney stones, History of neuropathy, Hyperlipidemia LDL goal <70, Hypertension, Insomnia, Left lower lobe pulmonary nodule, Long term current use of antithrombotics/antiplatelets, Long term current use of aromatase inhibitor, Lumbar radicular pain, Myocardial infarction [facility]), NSTEMI (non-ST elevated myocardial infarction) (HCC) (11/27/2021), Overactive bladder, Pneumonia, PONV (postoperative nausea and vomiting), Recurrent herpes labialis, Skin  cancer, basal cell, Spinal cord stimulator status, Squamous cell skin cancer, and Substance abuse (HCC). Surgical: Grant Young  has a past surgical history that includes Kidney stone surgery (09/14/1980); Kidney stone surgery (09/15/1995); Lithotripsy (09/14/2014); Lithotripsy (09/14/1996); Lithotripsy (09/14/1997); Gallbladder surgery (09/15/2015); Sigmoidoscopy (09/14/2017); Colonoscopy (09/15/2015); Cholecystectomy (2017); Shoulder surgery (Right, 1984); Back surgery; Spinal cord stimulator implant; Pain pump implantation; Pain pump removal; Tonsillectomy; Foot surgery (Bilateral, 03/18/2020); Foot surgery (08/032021); Upper gi endoscopy; Colonoscopy with propofol (N/A, 10/03/2020); Esophagogastroduodenoscopy (egd) with propofol (N/A, 10/03/2020); Cataract extraction w/PHACO (Left, 01/14/2021); Cataract extraction w/PHACO (Right, 01/28/2021); Coronary/Graft Acute MI Revascularization (N/A, 11/27/2021); LEFT HEART CATH AND CORONARY ANGIOGRAPHY (N/A, 11/27/2021); Excision basal cell carcinoma; transthoracic echocardiogram (11/28/2021); and Video bronchoscopy with endobronchial ultrasound (N/A, 01/15/2023). Family: family history includes Anxiety disorder in his daughter; Dementia in his mother; Heart disease in his father; Heart failure in his father; Hypertension in his father; Kidney Stones in his daughter; OCD in his daughter; Stroke in his father and mother.  Laboratory Chemistry Profile   Renal Lab  Results  Component Value Date   BUN 17 04/20/2023   CREATININE 0.80 04/20/2023   BCR SEE NOTE: 09/18/2022   GFRAA 101 05/20/2020   GFRNONAA >60 04/20/2023    Hepatic Lab Results  Component Value Date   AST 28 04/20/2023   ALT 36 04/20/2023   ALBUMIN 3.9 04/20/2023   ALKPHOS 110 04/20/2023   AMYLASE 32 09/18/2022   LIPASE 37 04/20/2023    Electrolytes Lab Results  Component Value Date   NA 142 04/20/2023   K 4.0 04/20/2023   CL 108 04/20/2023   CALCIUM 8.9 04/20/2023   MG 2.4 03/25/2023   PHOS 3.6 11/30/2021    Bone Lab Results  Component Value Date   TESTOSTERONE 33.2 05/20/2020    Inflammation (CRP: Acute Phase) (ESR: Chronic Phase) Lab Results  Component Value Date   CRP 22.9 (H) 08/18/2022   LATICACIDVEN 1.4 08/18/2022         Note: Above Lab results reviewed.  Recent Imaging Review  DG PAIN CLINIC C-ARM 1-60 MIN NO REPORT Fluoro was used, but no Radiologist interpretation will be provided.  Please refer to "NOTES" tab for provider progress note. Note: Reviewed        Physical Exam  General appearance: Well nourished, well developed, and well hydrated. In no apparent acute distress Mental status: Alert, oriented x 3 (person, place, & time)       Respiratory: No evidence of acute respiratory distress Eyes: PERLA Vitals: BP (!) 174/70   Pulse 65   Temp (!) 97.2 F (36.2 C)   Resp 16   Ht 5\' 9"  (1.753 m)   Wt 139 lb (63 kg)   SpO2 100%   BMI 20.53 kg/m  BMI: Estimated body mass index is 20.53 kg/m as calculated from the following:   Height as of this encounter: 5\' 9"  (1.753 m).   Weight as of this encounter: 139 lb (63 kg). Ideal: Ideal body weight: 70.7 kg (155 lb 13.8 oz)  Assessment   Diagnosis Status  1. Lumbar facet arthropathy   2. Lumbar spondylosis   3. Chronic pain syndrome    Controlled Controlled Controlled   Updated Problems: No problems updated.  Plan of Care  Moderate response to previous lumbar radiofrequency ablation.   Continue to monitor his symptoms.  Follow-up as needed.   Follow-up plan:   Return for patient will call to schedule F2F appt prn.      Status post Botox No. 1  on 01/01/2020 for migraine, lumbar TPI as well. SPG block 03/13/20. Bilateral GONB 04/01/20. 11/13/20 B/L L3,4,5 Facet blocks helpful repeat prn               Recent Visits Date Type Provider Dept  05/10/23 Procedure visit Edward Jolly, MD [facility]  04/15/23 Office Visit Edward Jolly, MD [facility]  Showing recent visits within past 90 days and meeting all other requirements Today's Visits Date Type Provider Dept  06/21/23 Office Visit Edward Jolly, MD [facility]  Showing today's visits and meeting all other requirements Future Appointments No visits were found meeting these conditions. Showing future appointments within next 90 days and meeting all other requirements  I discussed the assessment and treatment plan with the patient. The patient was provided an opportunity to ask questions and all were answered. The patient agreed with the plan and demonstrated an understanding of the instructions.  Patient advised to call back or seek an in-person evaluation if the symptoms or condition worsens.  Duration of encounter: .  Total time on encounter, as per AMA guidelines included both the face-to-face and non-face-to-face time personally spent by the physician and/or other qualified health care professional(s) on the day of the encounter (includes time in activities that require the physician or other qualified health care professional and does not include time in activities normally performed by clinical staff). Physician's time may include the following activities when performed: Preparing to see the patient (e.g., pre-charting review of records, searching for previously ordered imaging, lab work, and nerve conduction tests) Review of prior analgesic pharmacotherapies. Reviewing  PMP Interpreting ordered tests (e.g., lab work, imaging, nerve conduction tests) Performing post-procedure evaluations, including interpretation of diagnostic procedures Obtaining and/or reviewing separately obtained history Performing a medically appropriate examination and/or evaluation Counseling and educating the patient/family/caregiver Ordering medications, tests, or procedures Referring and communicating with other health care professionals (when not separately reported) Documenting clinical information in the electronic or other health record Independently interpreting results (not separately reported) and communicating results to the patient/ family/caregiver Care coordination (not separately reported)  Note by: Edward Jolly, MD Date: 06/21/2023; Time: 3:39 PM

## 2023-06-21 NOTE — Patient Instructions (Addendum)

## 2023-06-28 ENCOUNTER — Encounter: Payer: Self-pay | Admitting: Internal Medicine

## 2023-06-29 MED ORDER — DIAZEPAM 5 MG PO TABS: ORAL_TABLET | ORAL | 0 refills | Status: DC

## 2023-06-29 MED ORDER — ESCITALOPRAM OXALATE 20 MG PO TABS: 20.0000 mg | ORAL_TABLET | Freq: Every day | ORAL | 1 refills | Status: DC

## 2023-06-29 MED ORDER — BUTALBITAL-APAP-CAFFEINE 50-300-40 MG PO CAPS: 1.0000 | ORAL_CAPSULE | Freq: Four times a day (QID) | ORAL | 0 refills | Status: DC | PRN

## 2023-06-30 ENCOUNTER — Ambulatory Visit
Admission: RE | Admit: 2023-06-30 | Discharge: 2023-06-30 | Disposition: A | Discharge status: Home / Self Care | Payer: Medicare Other | Source: Ambulatory Visit | Attending: Student in an Organized Health Care Education/Training Program | Admitting: Student in an Organized Health Care Education/Training Program

## 2023-06-30 ENCOUNTER — Encounter: Payer: Self-pay | Admitting: Student in an Organized Health Care Education/Training Program

## 2023-06-30 ENCOUNTER — Ambulatory Visit
Payer: Medicare Other | Attending: Student in an Organized Health Care Education/Training Program | Admitting: Student in an Organized Health Care Education/Training Program

## 2023-06-30 VITALS — BP 156/96 | HR 61 | Temp 97.9°F | Resp 14 | Ht 69.0 in | Wt 139.0 lb

## 2023-06-30 DIAGNOSIS — M19029 Primary osteoarthritis, unspecified elbow: Secondary | ICD-10-CM | POA: Insufficient documentation

## 2023-06-30 DIAGNOSIS — M19022 Primary osteoarthritis, left elbow: Secondary | ICD-10-CM

## 2023-06-30 MED ORDER — ROPIVACAINE HCL 2 MG/ML IJ SOLN
9.0000 mL | Freq: Once | INTRAMUSCULAR | Status: AC
  Administered 2023-06-30: 9 mL via PERINEURAL

## 2023-06-30 MED ORDER — METHYLPREDNISOLONE ACETATE 40 MG/ML IJ SUSP
40.0000 mg | Freq: Once | INTRAMUSCULAR | Status: AC
  Administered 2023-06-30: 40 mg via INTRA_ARTICULAR

## 2023-06-30 MED ORDER — METHYLPREDNISOLONE ACETATE 40 MG/ML IJ SUSP
INTRAMUSCULAR | Status: AC
  Filled 2023-06-30: qty 1

## 2023-06-30 MED ORDER — DEXAMETHASONE SODIUM PHOSPHATE 10 MG/ML IJ SOLN: 10.0000 mg | Freq: Once | INTRAMUSCULAR | Status: DC

## 2023-06-30 MED ORDER — IOHEXOL 180 MG/ML  SOLN
INTRAMUSCULAR | Status: AC
  Filled 2023-06-30: qty 20

## 2023-06-30 MED ORDER — LIDOCAINE HCL 2 % IJ SOLN
20.0000 mL | Freq: Once | INTRAMUSCULAR | Status: AC
  Administered 2023-06-30: 400 mg

## 2023-06-30 MED ORDER — LIDOCAINE HCL 2 % IJ SOLN
INTRAMUSCULAR | Status: AC
  Filled 2023-06-30: qty 20

## 2023-06-30 MED ORDER — ROPIVACAINE HCL 2 MG/ML IJ SOLN
INTRAMUSCULAR | Status: AC
  Filled 2023-06-30: qty 20

## 2023-06-30 NOTE — Progress Notes (Signed)
PROVIDER NOTE: Interpretation of information contained herein should be left to medically-trained personnel. Specific patient instructions are provided elsewhere under "Patient Instructions" section of medical record. This document was created in part using STT-dictation technology, any transcriptional errors that may result from this process are unintentional.  Patient: Grant Young Type: Established DOB: 1945/12/09 MRN: [id number] PCP: Lorre Munroe, NP  Service: Procedure DOS: 06/30/2023 Setting: Ambulatory Location: Ambulatory outpatient facility Delivery: Face-to-face Provider: Edward Jolly, MD Specialty: Interventional Pain Management Specialty designation: 09 Location: Outpatient facility Ref. Prov.: Lorre Munroe, NP       Interventional Therapy   Primary Reason for Visit: Interventional Pain Management Treatment. CC: Elbow Pain (left), Migraine, and Back Pain    Procedure:          Anesthesia, Analgesia, Anxiolysis:  Type: Diagnostic Elbow Joint Steroid Injection  #1 with fluoroscopy Region: Posteromedial Elbow Area Level: Elbow Laterality: Left-Sided  Type: Local Anesthesia Local Anesthetic: Lidocaine 1-2% Sedation: None  Indication(s):  Analgesia Route: Infiltration ([place]/IM) IV Access: N/A   Position: Sitting   1. Elbow arthritis    NAS-11 Pain score:   Pre-procedure: 6 /10   Post-procedure: 3 /10     H&P (Pre-op Assessment):  Grant Young is a 77 y.o. (year old), male patient, seen today for interventional treatment. He  has a past surgical history that includes Kidney stone surgery (09/14/1980); Kidney stone surgery (09/15/1995); Lithotripsy (09/14/2014); Lithotripsy (09/14/1996); Lithotripsy (09/14/1997); Gallbladder surgery (09/15/2015); Sigmoidoscopy (09/14/2017); Colonoscopy (09/15/2015); Cholecystectomy (2017); Shoulder surgery (Right, 1984); Back surgery; Spinal cord stimulator implant; Pain pump implantation; Pain pump removal; Tonsillectomy; Foot surgery  (Bilateral, 03/18/2020); Foot surgery (08/032021); Upper gi endoscopy; Colonoscopy with propofol (N/A, 10/03/2020); Esophagogastroduodenoscopy (egd) with propofol (N/A, 10/03/2020); Cataract extraction w/PHACO (Left, 01/14/2021); Cataract extraction w/PHACO (Right, 01/28/2021); Coronary/Graft Acute MI Revascularization (N/A, 11/27/2021); LEFT HEART CATH AND CORONARY ANGIOGRAPHY (N/A, 11/27/2021); Excision basal cell carcinoma; transthoracic echocardiogram (11/28/2021); and Video bronchoscopy with endobronchial ultrasound (N/A, 01/15/2023). Grant Young has a current medication list which includes the following prescription(s): amitriptyline, anastrozole, butalbital-apap-caffeine, cetirizine, cholecalciferol, clopidogrel, vitamin b-12, dhea, diazepam, dicyclomine, digestive aids mixture, escitalopram, ezetimibe, hydralazine, ipratropium, loperamide hcl, magnesium bisglycinate, melatonin, NONFORMULARY OR COMPOUNDED ITEM, nutritional supplements, potassium, probiotic product, prochlorperazine, propranolol er, pyridostigmine, rabeprazole, sucralfate, testosterone cypionate, valacyclovir, and praluent. His primarily concern today is the Elbow Pain (left), Migraine, and Back Pain  Initial Vital Signs:  Pulse/HCG Rate: 61ECG Heart Rate: 64 Temp: 97.9 F (36.6 C) Resp: 16 BP: (!) 159/71 SpO2: 99 %  BMI: Estimated body mass index is 20.53 kg/m as calculated from the following:   Height as of this encounter: 5\' 9"  (1.753 m).   Weight as of this encounter: 139 lb (63 kg).  Risk Assessment: Allergies: Reviewed. He is allergic to ace inhibitors, fluoxetine, metoclopramide, nalbuphine, other, amoxicillin-pot clavulanate, doxazosin, duloxetine, penicillins, tamsulosin, trazodone and nefazodone, amlodipine, cinoxacin, ciprofloxacin, fludrocortisone, nebivolol, olanzapine, olmesartan, pregabalin, prostaglandins, thyroid hormones, zolpidem, duloxetine hcl, fluoxetine hcl, nucynta [tapentadol], and phenytoin.  Allergy  Precautions: None required Coagulopathies: Reviewed. None identified.  Blood-thinner therapy: None at this time Active Infection(s): Reviewed. None identified. Grant Young is afebrile  Site Confirmation: Grant Young was asked to confirm the procedure and laterality before marking the site Procedure checklist: Completed Consent: Before the procedure and under the influence of no sedative(s), amnesic(s), or anxiolytics, the patient was informed of the treatment options, risks and possible complications. To fulfill our ethical and legal obligations, as recommended by the American Medical Association's Code of Ethics, I have informed the patient  of my clinical impression; the nature and purpose of the treatment or procedure; the risks, benefits, and possible complications of the intervention; the alternatives, including doing nothing; the risk(s) and benefit(s) of the alternative treatment(s) or procedure(s); and the risk(s) and benefit(s) of doing nothing. The patient was provided information about the general risks and possible complications associated with the procedure. These may include, but are not limited to: failure to achieve desired goals, infection, bleeding, organ or nerve damage, allergic reactions, paralysis, and death. In addition, the patient was informed of those risks and complications associated to the procedure, such as failure to decrease pain; infection; bleeding; organ or nerve damage with subsequent damage to sensory, motor, and/or autonomic systems, resulting in permanent pain, numbness, and/or weakness of one or several areas of the body; allergic reactions; (i.e.: anaphylactic reaction); and/or death. Furthermore, the patient was informed of those risks and complications associated with the medications. These include, but are not limited to: allergic reactions (i.e.: anaphylactic or anaphylactoid reaction(s)); adrenal axis suppression; blood sugar elevation that in diabetics may result in  ketoacidosis or comma; water retention that in patients with history of congestive heart failure may result in shortness of breath, pulmonary edema, and decompensation with resultant heart failure; weight gain; swelling or edema; medication-induced neural toxicity; particulate matter embolism and blood vessel occlusion with resultant organ, and/or nervous system infarction; and/or aseptic necrosis of one or more joints. Finally, the patient was informed that Medicine is not an exact science; therefore, there is also the possibility of unforeseen or unpredictable risks and/or possible complications that may result in a catastrophic outcome. The patient indicated having understood very clearly. We have given the patient no guarantees and we have made no promises. Enough time was given to the patient to ask questions, all of which were answered to the patient's satisfaction. Mr. Pivarnik has indicated that he wanted to continue with the procedure. Attestation: I, the ordering provider, attest that I have discussed with the patient the benefits, risks, side-effects, alternatives, likelihood of achieving goals, and potential problems during recovery for the procedure that I have provided informed consent. Date  Time: 06/30/2023  9:58 AM  Pre-Procedure Preparation:  Monitoring: As per clinic protocol. Respiration, ETCO2, SpO2, BP, heart rate and rhythm monitor placed and checked for adequate function Safety Precautions: Patient was assessed for positional comfort and pressure points before starting the procedure. Time-out: I initiated and conducted the "Time-out" before starting the procedure, as per protocol. The patient was asked to participate by confirming the accuracy of the "Time Out" information. Verification of the correct person, site, and procedure were performed and confirmed by me, the nursing staff, and the patient. "Time-out" conducted as per Joint Commission's Universal Protocol (UP.01.01.01). Time:  1030 Start Time: 1031 hrs.  Description of Procedure:          Target Area: Medial epicondyle Approach: Medial approach. Area Prepped: Entire elbow Region DuraPrep (Iodine Povacrylex [0.7% available iodine] and Isopropyl Alcohol, 74% w/w) Safety Precautions: Aspiration looking for blood return was conducted prior to all injections. At no point did we inject any substances, as a needle was being advanced. No attempts were made at seeking any paresthesias. Safe injection practices and needle disposal techniques used. Medications properly checked for expiration dates. SDV (single dose vial) medications used. Description of the Procedure: Protocol guidelines were followed. The patient was placed in position. The target area was identified and the area prepped in the usual manner. Skin & deeper tissues infiltrated with local anesthetic. Appropriate  amount of time allowed to pass for local anesthetics to take effect. The procedure needles were then advanced to the target area. Proper needle placement secured. Negative aspiration confirmed. Solution injected in intermittent fashion, asking for systemic symptoms every 0.5cc of injectate. The needles were then removed and the area cleansed, making sure to leave some of the prepping solution back to take advantage of its long term bactericidal properties. Vitals:   06/30/23 1002 06/30/23 1025 06/30/23 1030 06/30/23 1034  BP: (!) 159/71 (!) 143/77 (!) 154/76 (!) 156/96  Pulse: 61     Resp: 16 16 14 14   Temp: 97.9 F (36.6 C)     SpO2: 99% 100% 100% 100%  Weight: 139 lb (63 kg)     Height: 5\' 9"  (1.753 m)       Start Time: 1031 hrs. End Time: 1033 hrs. Materials:  Needle(s) Type: Spinal Needle Gauge: 25G Length: 1.5-in Medication(s): 4cc solution made of 3 cc of 0.2% Ropivacaine and 1 cc of methylprednisolone, 40 mg/cc.                 Type of Imaging Technique: Fluoroscopy Guidance (Non-spinal) Indication(s): Assistance in needle guidance  and placement for procedures requiring needle placement in or near specific anatomical locations not easily accessible without such assistance. Exposure Time: No patient exposure Contrast: None used. Fluoroscopic Guidance: I was personally present during the use of fluoroscopy. "Tunnel Vision Technique" used to obtain the best possible view of the target area. Parallax error corrected before commencing the procedure. "Direction-depth-direction" technique used to introduce the needle under continuous pulsed fluoroscopy. Once target was reached, antero-posterior, oblique, and lateral fluoroscopic projection used confirm needle placement in all planes. Images permanently stored in EMR. Ultrasound Guidance: N/A   Antibiotic Prophylaxis:   Anti-infectives (From admission, onward)    None      Indication(s): None identified  Post-operative Assessment:  Post-procedure Vital Signs:  Pulse/HCG Rate: 6162 Temp: 97.9 F (36.6 C) Resp: 14 BP: (!) 156/96 SpO2: 100 %  EBL: None  Complications: No immediate post-treatment complications observed by team, or reported by patient.  Note: The patient tolerated the entire procedure well. A repeat set of vitals were taken after the procedure and the patient was kept under observation following institutional policy, for this type of procedure. Post-procedural neurological assessment was performed, showing return to baseline, prior to discharge. The patient was provided with post-procedure discharge instructions, including a section on how to identify potential problems. Should any problems arise concerning this procedure, the patient was given instructions to immediately contact us, at any time, without hesitation. In any case, we plan to contact the patient by telephone for a follow-up status report regarding this interventional procedure.  Comments:  No additional relevant information.  Plan of Care (POC)  Orders:  Orders Placed This Encounter   Procedures   DG PAIN CLINIC C-ARM 1-60 MIN NO REPORT    Intraoperative interpretation by procedural physician at [facility].    Standing Status:   Standing    Number of Occurrences:   1    Order Specific Question:   Reason for exam:    Answer:   Assistance in needle guidance and placement for procedures requiring needle placement in or near specific anatomical locations not easily accessible without such assistance.   Chronic Opioid Analgesic:  Hydrocodone IR 10 mg BID-TID prn    Medications ordered for procedure: Meds ordered this encounter  Medications   lidocaine (XYLOCAINE) 2 % (with pres) injection 400  mg   DISCONTD: dexamethasone (DECADRON) injection 10 mg   ropivacaine (PF) 2 mg/mL (0.2%) (NAROPIN) injection 9 mL   methylPREDNISolone acetate (DEPO-MEDROL) injection 40 mg   Medications administered: We administered lidocaine, ropivacaine (PF) 2 mg/mL (0.2%), and methylPREDNISolone acetate.  See the medical record for exact dosing, route, and time of administration.  Follow-up plan:   Return if symptoms worsen or fail to improve.       Status post Botox No. 1 on 01/01/2020 for migraine, lumbar TPI as well. SPG block 03/13/20. Bilateral GONB 04/01/20. 11/13/20 B/L L3,4,5 Facet blocks helpful repeat prn                Recent Visits Date Type Provider Dept  06/21/23 Office Visit Edward Jolly, MD [facility]  05/10/23 Procedure visit Edward Jolly, MD [facility]  04/15/23 Office Visit Edward Jolly, MD [facility]  Showing recent visits within past 90 days and meeting all other requirements Today's Visits Date Type Provider Dept  06/30/23 Procedure visit Edward Jolly, MD [facility]  Showing today's visits and meeting all other requirements Future Appointments No visits were found meeting these conditions. Showing future appointments within next 90 days and meeting all other requirements  Disposition: Discharge home   Discharge (Date  Time): 06/30/2023; 1036 hrs.   Primary Care Physician: Lorre Munroe, NP Location: [facility] Note by: Edward Jolly, MD (TTS technology used. I apologize for any typographical errors that were not detected and corrected.) Date: 06/30/2023; Time: 11:40 AM  Disclaimer:  Medicine is not an Visual merchandiser. The only guarantee in medicine is that nothing is guaranteed. It is important to note that the decision to proceed with this intervention was based on the information collected from the patient. The Data and conclusions were drawn from the patient's questionnaire, the interview, and the physical examination. Because the information was provided in large part by the patient, it cannot be guaranteed that it has not been purposely or unconsciously manipulated. Every effort has been made to obtain as much relevant data as possible for this evaluation. It is important to note that the conclusions that lead to this procedure are derived in large part from the available data. Always take into account that the treatment will also be dependent on availability of resources and existing treatment guidelines, considered by other Pain Management Practitioners as being common knowledge and practice, at the time of the intervention. For Medico-Legal purposes, it is also important to point out that variation in procedural techniques and pharmacological choices are the acceptable norm. The indications, contraindications, technique, and results of the above procedure should only be interpreted and judged by a Board-Certified Interventional Pain Specialist with extensive familiarity and expertise in the same exact procedure and technique.

## 2023-06-30 NOTE — Patient Instructions (Signed)

## 2023-06-30 NOTE — Progress Notes (Signed)
Safety precautions to be maintained throughout the outpatient stay will include: orient to surroundings, keep bed in low position, maintain call bell within reach at all times, provide assistance with transfer out of bed and ambulation.  

## 2023-07-01 ENCOUNTER — Telehealth: Payer: Self-pay

## 2023-07-01 NOTE — Telephone Encounter (Signed)
Post procedure follow up.  LM 

## 2023-07-03 ENCOUNTER — Other Ambulatory Visit: Payer: Self-pay | Admitting: Internal Medicine

## 2023-07-05 MED ORDER — PYRIDOSTIGMINE BROMIDE 60 MG PO TABS: 30.0000 mg | ORAL_TABLET | Freq: Two times a day (BID) | ORAL | 0 refills | Status: DC

## 2023-07-05 NOTE — Addendum Note (Signed)
Addended by: Judd Gaudier on: 07/05/2023 09:01 AM   Modules accepted: Orders

## 2023-07-06 ENCOUNTER — Encounter: Payer: Self-pay | Admitting: Neurology

## 2023-07-06 ENCOUNTER — Ambulatory Visit: Payer: Medicare Other | Admitting: Neurology

## 2023-07-06 VITALS — BP 170/81 | HR 57 | Ht 69.0 in | Wt 138.0 lb

## 2023-07-06 DIAGNOSIS — R519 Headache, unspecified: Secondary | ICD-10-CM | POA: Diagnosis not present

## 2023-07-06 DIAGNOSIS — R790 Abnormal level of blood mineral: Secondary | ICD-10-CM | POA: Insufficient documentation

## 2023-07-06 DIAGNOSIS — R251 Tremor, unspecified: Secondary | ICD-10-CM | POA: Diagnosis not present

## 2023-07-06 DIAGNOSIS — K9089 Other intestinal malabsorption: Secondary | ICD-10-CM

## 2023-07-06 DIAGNOSIS — R799 Abnormal finding of blood chemistry, unspecified: Secondary | ICD-10-CM

## 2023-07-06 MED ORDER — LAMOTRIGINE 25 MG PO TABS: 50.0000 mg | ORAL_TABLET | Freq: Every evening | ORAL | 11 refills | Status: DC

## 2023-07-06 NOTE — Progress Notes (Signed)
Chief Complaint  Patient presents with   New Patient (Initial Visit)    Rm 14. Accompanied by wife. NP Internal referral for TOC - chronic migraines, bilateral arm tremor, restless legs (states he has peripheral neuropathy), stuttering.      ASSESSMENT AND PLAN  Grant Young is a 77 y.o. male  Constellation of complaints Tremor  Mainly mild posturing component, no parkinsonian features, check TSH, Chronic headache,  In the setting of depression, anxiety, overuse of daily multiple dose of Fioricet, likely medicine rebound component,  Suggesting tapering off daily Fioricet use, add on lamotrigine 25 titrating to 50 mg every night as preventive medications, use Fioricet 2-3 times each week Peripheral neuropathy  Mild length-dependent sensory loss,  EMG nerve conduction study  Laboratory evaluation for treatable etiology   DIAGNOSTIC DATA (LABS, IMAGING, TESTING) - I reviewed patient records, labs, notes, testing and imaging myself where available.   MEDICAL HISTORY:  Grant Young, is a 77 year old male seen in request by his primary care nurse practitioner Lorre Munroe, for evaluation of constellation of complaints initial evaluation July 06, 2023  History is obtained from the patient and review of electronic medical records. I personally reviewed pertinent available imaging films in PACS.   PMHx of  CAD Depression, Anxiety, chronic insomnia, Valium 5/10 Chronic migraine, Fioricet 1-2 daily HLD HTN Lumbar decompression surgery Chronic pain, has spinal stimulator, not function OSA- mild, study in 2023, no need for CPAP  Patient complains of headaches since 2019, starting at occipital region spreading forward to settle at the frontal region, almost daily 5 out of 10 headache, most noticeable in the morning, either when he first wake up, or after breakfast, photophobia, but denies significant phonophobia, throbbing headache, over the years, he has been taking  Fioricet almost on a daily basis, described worsening headache without his Fioricet  He was seen by Outside neurologist Dr. 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 No evidence of acute fracture to the cervical spine.  2. Mild C5-C6 grade 1 anterolisthesis.  3. Cervical spondylosis as described and greatest at C6-C7.    PHYSICAL EXAM:   Vitals:   07/06/23 1055  BP: (!) 170/81  Pulse: (!) 57  Weight: 138 lb (62.6 kg)  Height: 5\' 9"  (1.753 m)   Body mass index is 20.38 kg/m.  PHYSICAL EXAMNIATION:  Gen: NAD, conversant, well nourised, well groomed                     Cardiovascular: Regular rate rhythm, no peripheral edema, warm, nontender. Eyes: Conjunctivae clear without exudates or hemorrhage Neck: Supple, no carotid bruits. Pulmonary: Clear to auscultation bilaterally   NEUROLOGICAL EXAM:  MENTAL STATUS: Speech/cognition: Awake, alert, oriented to history taking and casual conversation CRANIAL NERVES: CN II: Visual fields are full to confrontation. Pupils are round equal and briskly reactive to light. CN III, IV, VI: extraocular movement  are normal. No ptosis. CN V: Facial sensation is intact to light touch CN VII: Face is symmetric with normal eye closure  CN VIII: Hearing is normal to causal conversation. CN IX, X:  Phonation is normal. CN XI: Head turning and shoulder shrug are intact  MOTOR: There is no pronator drift of out-stretched arms. Muscle bulk and tone are normal. Muscle strength is normal.  REFLEXES: Reflexes are 1 and symmetric at the biceps, triceps, knees, and absent at ankles. Plantar responses are flexor.  SENSORY: Length-dependent decreased light touch, pinprick, vibratory sensation to ankle level  COORDINATION: There is no trunk or limb dysmetria noted.  GAIT/STANCE: Push-up to get up from seated position, mildly antalgic, but steady  REVIEW OF SYSTEMS:  Full 14 system review of systems performed and notable only for as above All other review of systems were negative.   ALLERGIES: Allergies  Allergen Reactions   Ace Inhibitors     Other reaction(s): Cough   Fluoxetine Anxiety    "made me fall asleep" per pt "bad headaches and "makes  Me  Crazy" historical allergy noted in [employer] "made me fall asleep" per pt "bad headaches and "makes  Me  Crazy" Per New Patient Packet.     Metoclopramide     Other reaction(s): Other (See Comments), Other (See Comments), Unknown Tardive Dyskinesia  historical allergy noted in [employer] Tardive Dyskinesia  Per New Patient Packet.   Nalbuphine     Used Post Back surgery- Anesthesiologist Error. Patient had Narcotic Withdraw. Per New Patient Packet.    Other     Other reaction(s): Other (See Comments) Altered mental status in combo with narcotics at previous hospitalization - Full Withdrawal Symptoms Other reaction(s): Rash   Amoxicillin-Pot Clavulanate Nausea Only    Per New Patient Packet.   Doxazosin Rash    Other reaction(s): Other - See Comments, Rash UNKNOWN REACTION UNKNOWN REACTION    Duloxetine Nausea Only    Per New Patient Packet.    Penicillins Nausea Only    Per New Patient Packet.   Tamsulosin Itching and Anxiety    Restless, Flushing, Heavy Chest, Itching, Hyperactive mood and Anxiety. Unable to handle side  effects. Per New Patient Packet.     Trazodone And Nefazodone Itching, Anxiety and Rash    Headache. "INCREASED MY ANXIETY AND HEARTRATE" Flushing, tachycardia "INCREASED MY ANXIETY AND HEARTRATE" Per New Patient Packet.    Amlodipine     Shaking, unsure of reaction. Per New Patient Packet.     Cinoxacin     GI Intolerance, and Dizziness. Per New Patient Packet.    Ciprofloxacin     Other reaction(s): Unknown   Fludrocortisone Other (See Comments)    "Worsening headaches, GI issues, fatigue"   Nebivolol     Other reaction(s): Unknown   Olanzapine     Headache and unable to sleep for 3 nights. Per New Patient Packet.    Olmesartan     Other reaction(s): Unknown   Pregabalin     Confusion, Lack of concentration, dizziness, and likely drowsiness. Per New Patient Packet.     Prostaglandins     Other reaction(s): Other (See Comments) Intolerance   Thyroid Hormones     Other reaction(s): Other (See Comments) Thyroid (Nature Thyroid) contraindicated with some of your other medications.   Zolpidem     Nightmares, Ineffective after 2 days. Per New Patient Packet.    Duloxetine Hcl     Other reaction(s): Rash   Fluoxetine Hcl  Other reaction(s): Rash   Nucynta [Tapentadol] Other (See Comments)    Vertigo    Phenytoin Anxiety    Hyperactivity, and Ineffective. Per New Patient Packet.     HOME MEDICATIONS: Current Outpatient Medications  Medication Sig Dispense Refill   amitriptyline (ELAVIL) 10 MG tablet Take 1 tablet (10 mg total) by mouth at bedtime. 90 tablet 0   anastrozole (ARIMIDEX) 1 MG tablet 1/2 tablet (0.5 mg) by mouth once weekly     Butalbital-APAP-Caffeine 50-300-40 MG CAPS Take 1 capsule by mouth every 6 (six) hours as needed. 90 capsule 0   cetirizine (ZYRTEC) 10 MG tablet Take 10 mg by mouth daily as needed for allergies.     Cholecalciferol 50 MCG (2000 UT) CAPS Take 1 tablet by mouth daily.     clopidogrel (PLAVIX) 75 MG tablet TAKE 1 TABLET BY MOUTH  ONCE DAILY. 90 tablet 2   Cyanocobalamin (VITAMIN B-12) 5000 MCG LOZG Take 1 lozenge by mouth daily.      DHEA 25 MG CAPS Take 25 mg by mouth daily.     diazepam (VALIUM) 5 MG tablet TAKE 1 TABLET IN THE MORNING AND 2 TABLETS AT BEDTIME 90 tablet 0   dicyclomine (BENTYL) 10 MG capsule TAKE 1 CAPSULE (10 MG TOTAL) BY MOUTH 4 TIMES A DAY BEFORE MEALS AND AT BEDTIME 90 capsule 1   Digestive Aids Mixture (DIGESTION GB PO) Take 1 capsule by mouth daily.     escitalopram (LEXAPRO) 20 MG tablet Take 1 tablet (20 mg total) by mouth daily. 90 tablet 1   ezetimibe (ZETIA) 10 MG tablet TAKE 1 TABLET BY MOUTH EVERY DAY 90 tablet 2   hydrALAZINE (APRESOLINE) 25 MG tablet Take 1 tablet (25 mg total) by mouth 2 (two) times daily. In the morning and at dinner, may take extra tablet for BP greater than 160 180 tablet 3   ipratropium (ATROVENT) 0.06 % nasal spray Place 1 spray into both nostrils 4 (four) times daily as needed for rhinitis. For up to 5-7 days then stop. 15 mL 0   Loperamide HCl (IMODIUM PO) Take by mouth as needed.      MAGNESIUM BISGLYCINATE PO Take by mouth daily.     melatonin 5 MG TABS Take 5 mg by mouth at bedtime.     NONFORMULARY OR COMPOUNDED ITEM Bi-Mix Papaverine 30mg , Phentolamine 1mg    Dosage: Inject 0.25cc-0.5cc as need for ED   Vial 1ml   Qty #5 Refills 6   [facility] [phone] Fax [phone] 5 each 6   Nutritional Supplements (NUTRITIONAL SUPPLEMENT PO) Take 1 capsule by mouth daily. [name]     POTASSIUM PO Take 1 tablet by mouth as directed. Takes 5x weekly.     Probiotic Product (PROBIOTIC DAILY PO) Take by mouth daily.     prochlorperazine (COMPAZINE) 10 MG tablet Take 1 tablet (10 mg total) by mouth 2 (two) times daily as needed for nausea or vomiting. 180 tablet 0   propranolol ER (INDERAL LA) 60 MG 24 hr capsule Take 1 capsule (60 mg total) by mouth 2 (two) times daily. At [facility] and Bedtime 180 capsule 3   pyridostigmine (MESTINON) 60  MG tablet Take 0.5 tablets (30 mg total) by mouth 2 (two) times daily. 120 tablet 0   RABEprazole (ACIPHEX) 20 MG tablet TAKE 1 TABLET BY MOUTH TWICE A DAY 180 tablet 3   testosterone cypionate (DEPOTESTOSTERONE CYPIONATE) 200 MG/ML injection Inject 80 mg into the muscle every 14 (fourteen) days.  valACYclovir (VALTREX) 1000 MG tablet Take 2 tabs p.o. and repeat in 12 hours as needed for cold sore 30 tablet 0   sucralfate (CARAFATE) 1 g tablet Take 1 tablet (1 g total) by mouth 4 (four) times daily for 15 days. 60 tablet 0   No current facility-administered medications for this visit.    PAST MEDICAL HISTORY: Past Medical History:  Diagnosis Date   Anemia    Aneurysm of ascending aorta (HCC)    a.) CT chest 08/18/2022: 4.1 cm; b.) CT chest 12/07/2022: 4.2 cm   Anxiety    a.) on BZO (diazepam) PRN   Aortic atherosclerosis (HCC)    Chronic back pain    Coronary artery disease    a.) s/p PCI with DES x 2 (pLCx and o-mLAD) 11/27/2021   Depression    Erectile dysfunction    a.) Bi-mix (papaverine + phentolamine) injections + exogenous testosterone injections   GERD (gastroesophageal reflux disease)    h/o Lyme disease    Headache    Hiatal hernia    History of bilateral cataract extraction 01/2021   History of gastritis    History of kidney stones    History of neuropathy    Hyperlipidemia LDL goal <70    a.) CAD with NSTEMI -->  intolerant of statins with statin myopathy and memory issues.;  Also intolerant of Repatha   Hypertension    Labile blood pressures but dizziness with blood pressures in the "normal range"; intolerant of most medications including ARB's, ACE inhibitor's, amlodipine most beta-blockers other than propranolol.   Insomnia    a.) takes malatonin PRN   Left lower lobe pulmonary nodule    a.) chest CT 12/07/2022: nodules x 2 posteromedial LLL;  8 x 11 mm and 8 x 10 mm   Long term current use of antithrombotics/antiplatelets    a.) DAPT (ASA + clopidogrel)    Long term current use of aromatase inhibitor    a,) anastrozole --> estridiol suppression secondary to exogenous testosterone use   Lumbar radicular pain    Myocardial infarction (HCC)    a.) MI x 2 - 1990 * 1999 - PTCA   NSTEMI (non-ST elevated myocardial infarction) (HCC) 11/27/2021   a.) LHC 11/28/2021: sequential 75% o-pLAD, 70% OM2, 80% p-mLAD, 99% mLAD, 100% pLCx --> PCI placing a 2.5 x 26 mm Onyx Frontier DES to pLCx and a 2.5 x 24 mm Onyx Frontier DES to the o-m LAD (covering 3 lesions)   Overactive bladder    Pneumonia    PONV (postoperative nausea and vomiting)    Recurrent herpes labialis    a.) has suppressive valacyclovir to use PRN   Skin cancer, basal cell    Spinal cord stimulator status    01/08/21 - not currently using.   Squamous cell skin cancer    Substance abuse (HCC)     PAST SURGICAL HISTORY: Past Surgical History:  Procedure Laterality Date   BACK SURGERY     BASAL CELL CARCINOMA EXCISION     CATARACT EXTRACTION W/PHACO Left 01/14/2021   Procedure: CATARACT EXTRACTION PHACO AND INTRAOCULAR LENS PLACEMENT (IOC) LEFT VIVITY TORIC LENS 8.75 00:56.7;  Surgeon: Galen Manila, MD;  Location: [facility];  Service: Ophthalmology;  Laterality: Left;   CATARACT EXTRACTION W/PHACO Right 01/28/2021   Procedure: CATARACT EXTRACTION PHACO AND INTRAOCULAR LENS PLACEMENT (IOC) RIGHT VIVITY TORIC LENS;  Surgeon: Galen Manila, MD;  Location: [facility];  Service: Ophthalmology;  Laterality: Right;  6.54 00:46.4   CHOLECYSTECTOMY  2017   COLONOSCOPY  09/15/2015   Per New Patient Packet   COLONOSCOPY WITH PROPOFOL N/A 10/03/2020   Procedure: COLONOSCOPY WITH PROPOFOL;  Surgeon: Wyline Mood, MD;  Location: [facility];  Service: Gastroenterology;  Laterality: N/A;   CORONARY/GRAFT ACUTE MI REVASCULARIZATION N/A 11/27/2021   Procedure: Coronary/Graft Acute MI Revascularization;  Surgeon: Marykay Lex, MD;  Location: [facility] CATH: (NSTEMI) - > 100%  prox-mid LCx (Onyx Frontier DES 2.5 x 26 -> 2.7 mm, Ost  OM1 65%.  Ost-mid LAD 3 lesions 75%, 90%, 99% => DES PCI Onyx Frontier DES 2.5 x 34 -> 2.8 mm   ESOPHAGOGASTRODUODENOSCOPY (EGD) WITH PROPOFOL N/A 10/03/2020   Procedure: ESOPHAGOGASTRODUODENOSCOPY (EGD) WITH PROPOFOL;  Surgeon: Wyline Mood, MD;  Location: [facility] END[facility];  Service: Gastroenterology;  Laterality: N/A;   FOOT SURGERY Bilateral 03/18/2020   FOOT SURGERY  08/032021   GALLBLADDER SURGERY  09/15/2015   Gallbladder Removal. Procedure done by Dr.Beverly. Per New Patient Packet   KIDNEY STONE SURGERY  09/14/1980   Too many to count. Per New Patient Packet 09/14/1980-09/15/1995   KIDNEY STONE SURGERY  09/15/1995   Too many to count. Per New Patient Packet   LEFT HEART CATH AND CORONARY ANGIOGRAPHY N/A 11/27/2021   Procedure: LEFT HEART CATH AND CORONARY ANGIOGRAPHY;  Surgeon: Marykay Lex, MD;  Location: [facility] CATH:  NSTEMI - 2 V CAD: 100% thrombotic prox-mid LCx (DES PCI), ost OM1 65%; Ost LAD 75% - prox LAD 90%, prox-mid 99% (DES PCI).  MIld diffuse RCA disease - calcicifed.  R-L collaterals filling LCx. EF ~45-50% with lateral HK. LVEDP 28 mmHg   LITHOTRIPSY  09/14/2014   Per New Patient Packet   LITHOTRIPSY  09/14/1996   No Stints Used. Per New Patient Packet   LITHOTRIPSY  09/14/1997   No Stints used. Per New Patient Packet   PAIN PUMP IMPLANTATION     PAIN PUMP REMOVAL     SHOULDER SURGERY Right 1984   SIGMOIDOSCOPY  09/14/2017   Per New Patient Packet   SPINAL CORD STIMULATOR IMPLANT     TONSILLECTOMY     TRANSTHORACIC ECHOCARDIOGRAM  11/28/2021   (in setting of non-STEMI): EF 50 to 55%.  Suspect inferior and lateral hypokinesis.  Indeterminate diastolic parameters.  Normal RV size and function.  Normal RAP.  Normal aortic and mitral valves with only mild MR and AI.  Normal RVP and RAP.   UPPER GI ENDOSCOPY     VIDEO BRONCHOSCOPY WITH ENDOBRONCHIAL ULTRASOUND N/A 01/15/2023   Procedure: VIDEO BRONCHOSCOPY WITH  ENDOBRONCHIAL ULTRASOUND;  Surgeon: Vida Rigger, MD;  Location: [facility];  Service: Thoracic;  Laterality: N/A;    FAMILY HISTORY: Family History  Problem Relation Age of Onset   Heart disease Father    Heart failure Father    Hypertension Father    Stroke Father    Stroke Mother    Dementia Mother    Kidney Stones Daughter    Anxiety disorder Daughter    OCD Daughter     SOCIAL HISTORY: Social History   Socioeconomic History   Marital status: Married    Spouse name: Therapist, art   Number of children: Not on file   Years of education: Not on file   Highest education level: Not on file  Occupational History   Not on file  Tobacco Use   Smoking status: Never    Passive exposure: Never   Smokeless tobacco: Never  Vaping Use   Vaping status: Never Used  Substance and Sexual Activity  Alcohol use: Yes    Comment: 1 Drink a Month, socially   Drug use: Not Currently   Sexual activity: Yes    Birth control/protection: None  Other Topics Concern   Not on file  Social History Narrative   Tobacco use, amount per day now: None   Past tobacco use, amount per day: None   How many years did you use tobacco: 0   Alcohol use (drinks per week): 0-1 Month   Diet:   Do you drink/eat things with caffeine: Occasionally ( Hot Chocolate and maybe 1/4 of 16oz Pepsi 2-3 times a week.   Marital status: Married                                  What year were you married? 1970   Do you live in a house, apartment, assisted living, condo, trailer, etc.? House   Is it one or more stories? One   How many persons live in your home? 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 Yes                                    Type and how often? [place], Recumbent Bike, 3 times a week. Try to get a 1.2 mile walk at least 3 times a week or more.      Do you have a living will? Yes   Do you have a DNR form?  No                                 If not, do you want to discuss one?   Do you have signed POA/HPOA forms? Yes                       If so, please bring to you appointment    Do you have any difficulty bathing or dressing yourself? No    Do you have difficulty preparing food or eating? No   Do you have difficulty managing your medications? No   Do you have any difficulty managing your finances? No   Do you have any difficulty affording your medications? No         Social Determinants of Health   Financial Resource Strain: Low Risk  (12/03/2022)   Overall Financial Resource Strain (CARDIA)    Difficulty of Paying Living Expenses: Not hard at all  Food Insecurity: No Food Insecurity (03/30/2023)   Hunger Vital Sign    Worried About Running Out of Food in the Last Year: Never true    Ran Out of Food in the Last Year: Never true  Transportation Needs: No Transportation Needs (03/30/2023)   PRAPARE - Administrator, Civil Service (Medical): No    Lack of Transportation (Non-Medical): No  Physical Activity: Insufficiently Active (12/03/2022)   Exercise Vital Sign    Days of Exercise per Week: 3 days    Minutes of Exercise per Session: 30 min  Stress: No Stress Concern Present (12/03/2022)   [employer] of Occupational Health - Occupational Stress Questionnaire  Feeling of Stress : Not at all  Social Connections: Moderately Integrated (12/03/2022)   Social Connection and Isolation Panel [NHANES]    Frequency of Communication with Friends and Family: Twice a week    Frequency of Social Gatherings with Friends and Family: More than three times a week    Attends Religious Services: More than 4 times per year    Active Member of [employer] or Organizations: No    Attends Banker Meetings: Never    Marital Status: Married  Catering manager Violence: Not At Risk (03/25/2023)   Humiliation, Afraid, Rape, and  Kick questionnaire    Fear of Current or Ex-Partner: No    Emotionally Abused: No    Physically Abused: No    Sexually Abused: No      Levert Feinstein, M.D. Ph.D.  [facility] [address] [place], [place] [address] Ph: [phone] Fax: [phone]  CC:  Lorre Munroe, NP [address] [place],  [place] [address]  Lorre Munroe, NP    Total time spent reviewing the chart, obtaining history, examined patient, ordering tests, documentation, consultations and family, care coordination was 75 minutes

## 2023-07-12 LAB — SEDIMENTATION RATE: Sed Rate: 2 mm/h (ref 0–30)

## 2023-07-12 LAB — CBC WITH DIFFERENTIAL/PLATELET
Basophils Absolute: 0 10*3/uL (ref 0.0–0.2)
Basos: 1 %
EOS (ABSOLUTE): 0 10*3/uL (ref 0.0–0.4)
Eos: 1 %
Hematocrit: 46.5 % (ref 37.5–51.0)
Hemoglobin: 14.8 g/dL (ref 13.0–17.7)
Immature Grans (Abs): 0 10*3/uL (ref 0.0–0.1)
Immature Granulocytes: 0 %
Lymphocytes Absolute: 1.6 10*3/uL (ref 0.7–3.1)
Lymphs: 26 %
MCH: 29.9 pg (ref 26.6–33.0)
MCHC: 31.8 g/dL (ref 31.5–35.7)
MCV: 94 fL (ref 79–97)
Monocytes Absolute: 0.7 10*3/uL (ref 0.1–0.9)
Monocytes: 12 %
Neutrophils Absolute: 3.7 10*3/uL (ref 1.4–7.0)
Neutrophils: 60 %
Platelets: 237 10*3/uL (ref 150–450)
RBC: 4.95 x10E6/uL (ref 4.14–5.80)
RDW: 12.1 % (ref 11.6–15.4)
WBC: 6.2 10*3/uL (ref 3.4–10.8)

## 2023-07-12 LAB — COMPREHENSIVE METABOLIC PANEL
ALT: 15 [IU]/L (ref 0–44)
AST: 17 [IU]/L (ref 0–40)
Albumin: 4.2 g/dL (ref 3.8–4.8)
Alkaline Phosphatase: 129 [IU]/L — ABNORMAL HIGH (ref 44–121)
BUN/Creatinine Ratio: 13 (ref 10–24)
BUN: 11 mg/dL (ref 8–27)
Bilirubin Total: 0.3 mg/dL (ref 0.0–1.2)
CO2: 25 mmol/L (ref 20–29)
Calcium: 9.4 mg/dL (ref 8.6–10.2)
Chloride: 105 mmol/L (ref 96–106)
Creatinine, Ser: 0.84 mg/dL (ref 0.76–1.27)
Globulin, Total: 1.9 g/dL (ref 1.5–4.5)
Glucose: 75 mg/dL (ref 70–99)
Potassium: 4.1 mmol/L (ref 3.5–5.2)
Sodium: 145 mmol/L — ABNORMAL HIGH (ref 134–144)
Total Protein: 6.1 g/dL (ref 6.0–8.5)
eGFR: 92 mL/min/{1.73_m2} (ref >59)

## 2023-07-12 LAB — MULTIPLE MYELOMA PANEL, SERUM
Albumin SerPl Elph-Mcnc: 3.7 g/dL (ref 2.9–4.4)
Albumin/Glob SerPl: 1.6 (ref 0.7–1.7)
Alpha 1: 0.2 g/dL (ref 0.0–0.4)
Alpha2 Glob SerPl Elph-Mcnc: 0.6 g/dL (ref 0.4–1.0)
B-Globulin SerPl Elph-Mcnc: 0.9 g/dL (ref 0.7–1.3)
Gamma Glob SerPl Elph-Mcnc: 0.7 g/dL (ref 0.4–1.8)
Globulin, Total: 2.4 g/dL (ref 2.2–3.9)
IgA/Immunoglobulin A, Serum: 286 mg/dL (ref 61–437)
IgG (Immunoglobin G), Serum: 773 mg/dL (ref 603–1613)
IgM (Immunoglobulin M), Srm: 41 mg/dL (ref 15–143)

## 2023-07-12 LAB — ANA W/REFLEX IF POSITIVE: Anti Nuclear Antibody (ANA): NEGATIVE

## 2023-07-12 LAB — FERRITIN: Ferritin: 50 ng/mL (ref 30–400)

## 2023-07-12 LAB — VITAMIN B12: Vitamin B-12: >2000 pg/mL — ABNORMAL HIGH (ref 232–1245)

## 2023-07-12 LAB — CK: Total CK: 61 U/L (ref 41–331)

## 2023-07-12 LAB — FOLATE: Folate: 11 ng/mL (ref >3.0)

## 2023-07-12 LAB — C-REACTIVE PROTEIN: CRP: 1 mg/L (ref 0–10)

## 2023-07-12 LAB — VITAMIN D 25 HYDROXY (VIT D DEFICIENCY, FRACTURES): Vit D, 25-Hydroxy: 55.7 ng/mL (ref 30.0–100.0)

## 2023-07-12 LAB — HGB A1C W/O EAG: Hgb A1c MFr Bld: 5 % (ref 4.8–5.6)

## 2023-07-12 LAB — RPR: RPR Ser Ql: NONREACTIVE

## 2023-07-12 LAB — TSH: TSH: 2.71 u[IU]/mL (ref 0.450–4.500)

## 2023-07-12 NOTE — Progress Notes (Unsigned)
Cardiology Office Note    Date:  07/13/2023   ID:  Grant Young, Grant Young March 01, 1946, MRN [id number]  PCP:  Lorre Munroe, NP  Cardiologist:  Bryan Lemma, MD  Electrophysiologist:  None   Chief Complaint: Follow-up labile hypertension  History of Present Illness:   Grant Young is a 77 y.o. male with history of CAD with MI x 2 in 1990 and 99 status post PTCA and with NSTEMI in 3/23 SP PCI/DES to the LAD and LCx, heart failure with perfuse ejection fraction, hypertension, hyperlipidemia, headaches who presents for follow-up of CAD.  On 11/2021 he was admitted to the hospital for NSTEMI and underwent PCI to LAD and LCx.  LV gram noted mildly reduced LVEF with lateral hypokinesis.  Echo at this time showed an EF of 50 to 55%, inferior and lateral wall hypokinesis, indeterminate LV diastolic function parameters, normal RV systolic function, normal PASP, mild mitral valve regurgitation, estimated right atrial pressure of 3 mmHg.  After he was discharged he noted significant fatigue which was thought to be related to his statin therapy.  Was referred to lipid clinic who initiated Repatha.  He was seen by pulmonology for persistent cough and fall 2023, he was being treated for right lower lobe pneumonia.  CT performed 06/03/2022 with multilobar consolidation.  He had a PET scan on 05/2022 showing interval development of multilobar airspace consolidation.  These findings were suspicious for infectious etiology.  He was admitted to the hospital from 12/5 through 08/20/22 with pneumonia due to rhinovirus.  CT during admission showed further improvement in right lower lobe opacity, although nodular infiltrates are also noted and not improved.  Infectious disease was consulted at this time.  Seen by Dr. Herbie Baltimore on 12/31/2022, at this time he was 1 year out from his two-vessel PCI, aspirin 81 mg was discontinued and Plavix was continued as monotherapy.  Also during this visit he has had a lot of issues with  orthostasis and dizziness.  He was currently on propranolol 80 mg twice daily at this time but had a lot of fatigue and exercise intolerance.  He was not tolerating his hydralazine stating worsening dizziness.  This time hydralazine was discontinued is a daily med and started as an needed medication for SBP greater than 160 mmHg.  Of note during this visit he was weaning off of Norco for chronic pain.  Dr. Herbie Baltimore decreased his propranolol to 40 mg a.m. dosing and 80 mg p.m. dosing when he was 2 weeks fully weaned off of Norco.  Was seen by lipid clinic/pharmacy on 03/24/2023, where he noted his Repatha made his chronic back pain worse.  He will switch from Repatha to Praluent.  Last seen by Dr. Herbie Baltimore on 05/06/2023, patient was very concerned about his blood pressures at the time.  His blood pressure log showing a wide range of blood pressures was 1 recording of 192/94.  It was noted that patient actually increase his Inderal to either 60 or 80 mg twice daily.  It was noted he was taken his as needed hydralazine almost every day with directions to take it for systolic blood pressures greater than 160.  He noted frequent headaches.  He had denied any anginal concerns.  As for lipids, he had stopped Praluent after noticing a headache but was encouraged to restart it.  Inderal was adjusted to 60 mg twice daily at lunch and bedtime although it seems as patient has been adjusting his home medications at home.  Hydralazine  was adjusted from as needed to 25 mg twice a day in the morning and dinner where he may take an extra dose for blood pressure greater than 160.   Today:  Grant Young is doing well overall.  He brings in his blood pressure log today, where he has generally been taking his blood pressure twice a day consistently.  Highest reading 172/86, however he is consistently in the 130s to 140s systolic.  He does report better control of his blood pressures since taking the hydralazine twice daily.  He tells  me that he stopped his Effexor and transition to Lexapro in the beginning of September of this year due to spikes in his systolic blood pressure, we spoke about how Effexor is known to lead to blood pressure spikes.  He notes ever since he stopped the Effexor his blood pressure has seemed to be less labile and more consistent.  He notes that he did see [name] on 06/28/2023 for labile hypertension as well.  He has had to take his hydralazine 1-2 times as his PRN dose since last visit due to blood pressures over 160. He is now on a NuStep and a bike doing exercise daily.  He notes that he is able to go to the max speed on his NuStep before he gets a little short of breath (level 10 out of 10).  However he is able to mildly and moderately exert himself without any anginal symptoms.  He denies any shortness of breath or chest pain while walking, speed walking, walking up stairs.  He does have a EGD scheduled for 07/27/2023, preoperative risk assessment has been completed he is to hold Plavix 5 days prior to procedure.  He denies chest pain, lower extremity edema, fatigue, palpitations, melena, hematuria, hemoptysis, diaphoresis, weakness, presyncope, syncope, orthopnea, and PND.  Labs independently reviewed: 06/2023 - A1c 5.0, BUN 11, serum creatinine 0.84, potassium 4.1, albumin 4.2, AST/ALT normal, Hgb 14.8, PLT 237, TSH normal 03/2023 - TC 164, TG 70, HDL 49, LDL 101  Past Medical History:  Diagnosis Date   Anemia    Aneurysm of ascending aorta (HCC)    a.) CT chest 08/18/2022: 4.1 cm; b.) CT chest 12/07/2022: 4.2 cm   Anxiety    a.) on BZO (diazepam) PRN   Aortic atherosclerosis (HCC)    Chronic back pain    Coronary artery disease    a.) s/p PCI with DES x 2 (pLCx and o-mLAD) 11/27/2021   Depression    Erectile dysfunction    a.) Bi-mix (papaverine + phentolamine) injections + exogenous testosterone injections   GERD (gastroesophageal reflux disease)    h/o Lyme disease    Headache     Hiatal hernia    History of bilateral cataract extraction 01/2021   History of gastritis    History of kidney stones    History of neuropathy    Hyperlipidemia LDL goal <70    a.) CAD with NSTEMI -->  intolerant of statins with statin myopathy and memory issues.;  Also intolerant of Repatha   Hypertension    Labile blood pressures but dizziness with blood pressures in the "normal range"; intolerant of most medications including ARB's, ACE inhibitor's, amlodipine most beta-blockers other than propranolol.   Insomnia    a.) takes malatonin PRN   Left lower lobe pulmonary nodule    a.) chest CT 12/07/2022: nodules x 2 posteromedial LLL;  8 x 11 mm and 8 x 10 mm   Long term current use of  antithrombotics/antiplatelets    a.) DAPT (ASA + clopidogrel)   Long term current use of aromatase inhibitor    a,) anastrozole --> estridiol suppression secondary to exogenous testosterone use   Lumbar radicular pain    Myocardial infarction (HCC)    a.) MI x 2 - 1990 * 1999 - PTCA   NSTEMI (non-ST elevated myocardial infarction) (HCC) 11/27/2021   a.) LHC 11/28/2021: sequential 75% o-pLAD, 70% OM2, 80% p-mLAD, 99% mLAD, 100% pLCx --> PCI placing a 2.5 x 26 mm Onyx Frontier DES to pLCx and a 2.5 x 24 mm Onyx Frontier DES to the o-m LAD (covering 3 lesions)   Overactive bladder    Pneumonia    PONV (postoperative nausea and vomiting)    Recurrent herpes labialis    a.) has suppressive valacyclovir to use PRN   Skin cancer, basal cell    Spinal cord stimulator status    01/08/21 - not currently using.   Squamous cell skin cancer    Substance abuse [facility])     Past Surgical History:  Procedure Laterality Date   BACK SURGERY     BASAL CELL CARCINOMA EXCISION     CATARACT EXTRACTION W/PHACO Left 01/14/2021   Procedure: CATARACT EXTRACTION PHACO AND INTRAOCULAR LENS PLACEMENT (IOC) LEFT VIVITY TORIC LENS 8.75 00:56.7;  Surgeon: Galen Manila, MD;  Location: [facility];  Service:  Ophthalmology;  Laterality: Left;   CATARACT EXTRACTION W/PHACO Right 01/28/2021   Procedure: CATARACT EXTRACTION PHACO AND INTRAOCULAR LENS PLACEMENT (IOC) RIGHT VIVITY TORIC LENS;  Surgeon: Galen Manila, MD;  Location: [facility];  Service: Ophthalmology;  Laterality: Right;  6.54 00:46.4   CHOLECYSTECTOMY  2017   COLONOSCOPY  09/15/2015   Per New Patient Packet   COLONOSCOPY WITH PROPOFOL N/A 10/03/2020   Procedure: COLONOSCOPY WITH PROPOFOL;  Surgeon: Wyline Mood, MD;  [facility];  Service: Gastroenterology;  Laterality: N/A;   CORONARY/GRAFT ACUTE MI REVASCULARIZATION N/A 11/27/2021   Procedure: Coronary/Graft Acute MI Revascularization;  Surgeon: Marykay Lex, MD;  Location: [facility] CATH: (NSTEMI) - > 100% prox-mid LCx (Onyx Frontier DES 2.5 x 26 -> 2.7 mm, Ost  OM1 65%.  Ost-mid LAD 3 lesions 75%, 90%, 99% => DES PCI Onyx Frontier DES 2.5 x 34 -> 2.8 mm   ESOPHAGOGASTRODUODENOSCOPY (EGD) WITH PROPOFOL N/A 10/03/2020   Procedure: ESOPHAGOGASTRODUODENOSCOPY (EGD) WITH PROPOFOL;  Surgeon: Wyline Mood, MD;  Location: [facility] END[facility]COPY;  Service: Gastroenterology;  Laterality: N/A;   FOOT SURGERY Bilateral 03/18/2020   FOOT SURGERY  08/032021   GALLBLADDER SURGERY  09/15/2015   Gallbladder Removal. Procedure done by Dr.Beverly. Per New Patient Packet   KIDNEY STONE SURGERY  09/14/1980   Too many to count. Per New Patient Packet 09/14/1980-09/15/1995   KIDNEY STONE SURGERY  09/15/1995   Too many to count. Per New Patient Packet   LEFT HEART CATH AND CORONARY ANGIOGRAPHY N/A 11/27/2021   Procedure: LEFT HEART CATH AND CORONARY ANGIOGRAPHY;  Surgeon: Marykay Lex, MD;  Location: [facility] CATH:  NSTEMI - 2 V CAD: 100% thrombotic prox-mid LCx (DES PCI), ost OM1 65%; Ost LAD 75% - prox LAD 90%, prox-mid 99% (DES PCI).  MIld diffuse RCA disease - calcicifed.  R-L collaterals filling LCx. EF ~45-50% with lateral HK. LVEDP 28 mmHg   LITHOTRIPSY  09/14/2014   Per New Patient  Packet   LITHOTRIPSY  09/14/1996   No Stints Used. Per New Patient Packet   LITHOTRIPSY  09/14/1997   No Stints used. Per New Patient Packet  PAIN PUMP IMPLANTATION     PAIN PUMP REMOVAL     SHOULDER SURGERY Right 1984   SIGMOIDOSCOPY  09/14/2017   Per New Patient Packet   SPINAL CORD STIMULATOR IMPLANT     TONSILLECTOMY     TRANSTHORACIC ECHOCARDIOGRAM  11/28/2021   (in setting of non-STEMI): EF 50 to 55%.  Suspect inferior and lateral hypokinesis.  Indeterminate diastolic parameters.  Normal RV size and function.  Normal RAP.  Normal aortic and mitral valves with only mild MR and AI.  Normal RVP and RAP.   UPPER GI ENDOSCOPY     VIDEO BRONCHOSCOPY WITH ENDOBRONCHIAL ULTRASOUND N/A 01/15/2023   Procedure: VIDEO BRONCHOSCOPY WITH ENDOBRONCHIAL ULTRASOUND;  Surgeon: Vida Rigger, MD;  Location: [facility];  Service: Thoracic;  Laterality: N/A;    Current Medications: Current Meds  Medication Sig   amitriptyline (ELAVIL) 10 MG tablet Take 1 tablet (10 mg total) by mouth at bedtime.   anastrozole (ARIMIDEX) 1 MG tablet 1/2 tablet (0.5 mg) by mouth once weekly   Butalbital-APAP-Caffeine 50-300-40 MG CAPS Take 1 capsule by mouth every 6 (six) hours as needed.   cetirizine (ZYRTEC) 10 MG tablet Take 10 mg by mouth daily as needed for allergies.   Cholecalciferol 50 MCG (2000 UT) CAPS Take 1 tablet by mouth daily.   cholestyramine (QUESTRAN) 4 g packet TAKE 1 PACKET (4 G TOTAL) BY MOUTH 3 (THREE) TIMES DAILY.   clopidogrel (PLAVIX) 75 MG tablet TAKE 1 TABLET BY MOUTH ONCE DAILY.   Cyanocobalamin (VITAMIN B-12) 5000 MCG LOZG Take 1 lozenge by mouth daily.    DHEA 25 MG CAPS Take 25 mg by mouth daily.   diazepam (VALIUM) 5 MG tablet TAKE 1 TABLET IN THE MORNING AND 2 TABLETS AT BEDTIME   dicyclomine (BENTYL) 10 MG capsule TAKE 1 CAPSULE (10 MG TOTAL) BY MOUTH 4 TIMES A DAY BEFORE MEALS AND AT BEDTIME   Digestive Aids Mixture (DIGESTION GB PO) Take 1 capsule by mouth daily.   escitalopram  (LEXAPRO) 20 MG tablet Take 1 tablet (20 mg total) by mouth daily.   ezetimibe (ZETIA) 10 MG tablet TAKE 1 TABLET BY MOUTH EVERY DAY   hydrALAZINE (APRESOLINE) 25 MG tablet Take 1 tablet (25 mg total) by mouth 2 (two) times daily. In the morning and at dinner, may take extra tablet for BP greater than 160   ipratropium (ATROVENT) 0.06 % nasal spray Place 1 spray into both nostrils 4 (four) times daily as needed for rhinitis. For up to 5-7 days then stop.   lamoTRIgine (LAMICTAL) 25 MG tablet Take 2 tablets (50 mg total) by mouth at bedtime.   Loperamide HCl (IMODIUM PO) Take by mouth as needed.    MAGNESIUM BISGLYCINATE PO Take by mouth daily.   melatonin 5 MG TABS Take 5 mg by mouth at bedtime.   NONFORMULARY OR COMPOUNDED ITEM Bi-Mix Papaverine 30mg , Phentolamine 1mg    Dosage: Inject 0.25cc-0.5cc as need for ED   Vial 1ml   Qty #5 Refills 6   [facility] [phone] Fax [phone]   Nutritional Supplements (NUTRITIONAL SUPPLEMENT PO) Take 1 capsule by mouth daily. [name]   POTASSIUM PO Take 1 tablet by mouth as directed. Takes 5x weekly.   PREVIDENT 5000 DRY MOUTH 1.1 % GEL dental gel SMARTSIG:To Teeth 3 Times Daily   Probiotic Product (PROBIOTIC DAILY PO) Take by mouth daily.   prochlorperazine (COMPAZINE) 10 MG tablet Take 1 tablet (10 mg total) by mouth 2 (two) times daily as  needed for nausea or vomiting.   propranolol ER (INDERAL LA) 60 MG 24 hr capsule Take 1 capsule (60 mg total) by mouth 2 (two) times daily. At Lunch and Bedtime   pyridostigmine (MESTINON) 60 MG tablet Take 0.5 tablets (30 mg total) by mouth 2 (two) times daily.   RABEprazole (ACIPHEX) 20 MG tablet TAKE 1 TABLET BY MOUTH TWICE A DAY   sucralfate (CARAFATE) 1 g tablet Take 1 tablet (1 g total) by mouth 4 (four) times daily for 15 days.   testosterone cypionate (DEPOTESTOSTERONE CYPIONATE) 200 MG/ML injection Inject 80 mg into the muscle every 14 (fourteen) days.   valACYclovir  (VALTREX) 1000 MG tablet Take 2 tabs p.o. and repeat in 12 hours as needed for cold sore    Allergies:   Ace inhibitors, Fluoxetine, Metoclopramide, Nalbuphine, Other, Amoxicillin-pot clavulanate, Doxazosin, Duloxetine, Penicillins, Tamsulosin, Trazodone and nefazodone, Amlodipine, Cinoxacin, Ciprofloxacin, Fludrocortisone, Nebivolol, Olanzapine, Olmesartan, Pregabalin, Prostaglandins, Thyroid hormones, Zolpidem, Duloxetine hcl, Fluoxetine hcl, Nucynta [tapentadol], and Phenytoin   Social History   Socioeconomic History   Marital status: Married    Spouse name: Therapist, art   Number of children: Not on file   Years of education: Not on file   Highest education level: Not on file  Occupational History   Not on file  Tobacco Use   Smoking status: Never    Passive exposure: Never   Smokeless tobacco: Never  Vaping Use   Vaping status: Never Used  Substance and Sexual Activity   Alcohol use: Yes    Comment: 1 Drink a Month, socially   Drug use: Not Currently   Sexual activity: Yes    Birth control/protection: None  Other Topics Concern   Not on file  Social History Narrative   Tobacco use, amount per day now: None   Past tobacco use, amount per day: None   How many years did you use tobacco: 0   Alcohol use (drinks per week): 0-1 Month   Diet:   Do you drink/eat things with caffeine: Occasionally ( Hot Chocolate and maybe 1/4 of 16oz Pepsi 2-3 times a week.   Marital status: Married                                  What year were you married? 1970   Do you live in a house, apartment, assisted living, condo, trailer, etc.? House   Is it one or more stories? One   How many persons live in your home? 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 Yes                                    Type and how often? [place], Recumbent  Bike, 3 times a week. Try to get a 1.2 mile walk at least 3 times a week or more.     Do you have a living will? Yes   Do you have a DNR form?  No                                 If not, do you want  to discuss one?   Do you have signed POA/HPOA forms? Yes                       If so, please bring to you appointment    Do you have any difficulty bathing or dressing yourself? No    Do you have difficulty preparing food or eating? No   Do you have difficulty managing your medications? No   Do you have any difficulty managing your finances? No   Do you have any difficulty affording your medications? No         Social Determinants of Health   Financial Resource Strain: Low Risk  (12/03/2022)   Overall Financial Resource Strain (CARDIA)    Difficulty of Paying Living Expenses: Not hard at all  Food Insecurity: No Food Insecurity (03/30/2023)   Hunger Vital Sign    Worried About Running Out of Food in the Last Year: Never true    Ran Out of Food in the Last Year: Never true  Transportation Needs: No Transportation Needs (03/30/2023)   PRAPARE - Administrator, Civil Service (Medical): No    Lack of Transportation (Non-Medical): No  Physical Activity: Insufficiently Active (12/03/2022)   Exercise Vital Sign    Days of Exercise per Week: 3 days    Minutes of Exercise per Session: 30 min  Stress: No Stress Concern Present (12/03/2022)   [employer] of Occupational Health - Occupational Stress Questionnaire    Feeling of Stress : Not at all  Social Connections: Moderately Integrated (12/03/2022)   Social Connection and Isolation Panel [NHANES]    Frequency of Communication with Friends and Family: Twice a week    Frequency of Social Gatherings with Friends and Family: More than three times a week    Attends Religious Services: More than 4 times per year    Active Member of [employer] or Organizations: No    Attends Engineer, structural: Never    Marital Status: Married      Family History:  The patient's family history includes Anxiety disorder in his daughter; Dementia in his mother; Heart disease in his father; Heart failure in his father; Hypertension in his father; Kidney Stones in his daughter; OCD in his daughter; Stroke in his father and mother.  ROS:   12-point review of systems is negative unless otherwise noted in the HPI.   EKGs/Labs/Other Studies Reviewed:    Studies reviewed were summarized above. The additional studies were reviewed today:  Renal artery ultrasound 04/09/2023: IMPRESSION: 1. No evidence of significant renal artery stenosis bilaterally. 2. Increased cortical echogenicity of both kidneys likely reflecting underlying component of chronic kidney disease. __________  2D echo 03/27/2023: 1. Left ventricular ejection fraction, by estimation, is 50 to 55%. The  left ventricle has low normal function. The left ventricle has no regional  wall motion abnormalities. There is mild left ventricular hypertrophy.  Left ventricular diastolic  parameters are indeterminate.   2. Right ventricular systolic function is normal. The right ventricular  size is normal. There is normal pulmonary artery systolic pressure.   3. Left atrial size was severely dilated.   4. The mitral valve is normal in structure. Trivial mitral valve  regurgitation. No evidence of mitral stenosis.   5. The aortic valve is tricuspid. Aortic valve regurgitation is mild. No  aortic stenosis is present.   6. The inferior vena cava is normal in size with greater than 50%  respiratory variability, suggesting  right atrial pressure of 3 mmHg.  __________  2D echo 11/28/2021: 1. Left ventricular ejection fraction, by estimation, is 50 to 55%. The  left ventricle has low normal function. The left ventricle demonstrates  regional wall motion abnormalities (select images with concern for  inferior and lateral wall hypokinesis).  Left ventricular diastolic parameters are  indeterminate.   2. Right ventricular systolic function is normal. The right ventricular  size is normal. There is normal pulmonary artery systolic pressure. The  estimated right ventricular systolic pressure is 15.2 mmHg.   3. The mitral valve is normal in structure. Mild mitral valve  regurgitation. No evidence of mitral stenosis.   4. The aortic valve is normal in structure. Aortic valve regurgitation is  mild. No aortic stenosis is present.   5. The inferior vena cava is normal in size with greater than 50%  respiratory variability, suggesting right atrial pressure of 3 mmHg. __________   LHC 11/27/2021:   CULPRIT LESION: Prox Cx to Mid Cx lesion is 100% stenosed with 70% stenosed side branch in 2nd Mrg.   A drug-eluting stent was successfully placed using a STENT ONYX FRONTIER 2.5X26.  Deployed to 2.7 mm   Post intervention, there is a 0% residual stenosis. Post intervention, the side branch was reduced to 60% residual stenosis.   -------------------------------------------------   LESION SEGMENT #2: Ost LAD to Prox LAD lesion is 75% stenosed.  Prox LAD to Mid LAD lesion is 80% stenosed. Mid LAD lesion is 99% stenosed. (total length ~32 mm)   Prox LAD to Mid LAD lesion is 80% stenosed.   A drug-eluting stent was successfully placed covering all 3 lesions, using a STENT ONYX FRONTIER 2.5X34.  Postdilated to 2.8 mm   Post intervention, there is a 0% residual stenosis.   -------------------------------------------------   Moderately calcified very large dominant RCA with mild disease throughout.   -------------------------------------------------   Post intervention, there is a 0% residual stenosis.   There is mild left ventricular systolic dysfunction. The left ventricular ejection fraction is 45-50% by visual estimate. -Lateral wall hypokinesis   LV end diastolic pressure is severely elevated.   There is no aortic valve stenosis.   SUMMARY Acute Coronary Syndrome / NSTEMI (with STEMI  physiology) Severe two-vessel CAD :  Culprit lesion 100% occluded proximal and mid LCx  Sequential ostial proximal LAD 75, 80 to 99% stenosis Successful revascularization of occluded LCx with Onyx Frontier 2.5 mm x 26 mm deployed to 2.7 mm.  TIMI 0 flow improved to TIMI-3.  Also TIMI-3 flow noted in major/small caliber OM branch with an ostial 60 to 70% stenosis (not PCI during Successful ostial to mid LAD PCI (3 tandem lesions ~total length 32 mmm) covering the total lesion segment with an Onyx Frontier 2.5 mm x 34 mm-deployed/postdilated to 2.8 mm Heavily calcified but widely patent large dominant RCA that did provide collaterals to the occluded LCx Mildly reduced EF with lateral hypokinesis and significant elevated LVEDP of 28 mmHg     RECOMMENDATIONS ICU admission for post PCI care. Very complex medication list, will need to figure out the best way to treat lipids.  He is on propranolol which was the only tolerated beta-blocker.  Marland Kitchen DAPT per recommendations section Check 2D echo Pending echo results, and symptoms, could potentially consider discharge in 2 to 3 days. __________   2D echo 12/11/2019: 1. Left ventricular ejection fraction, by estimation, is 60 to 65%. The  left ventricle has normal function. The left ventricle has no regional  wall motion abnormalities. Left ventricular diastolic parameters are  consistent with Grade I diastolic  dysfunction (impaired relaxation).   2. Right ventricular systolic function is normal. The right ventricular  size is normal. There is normal pulmonary artery systolic pressure.   3. The mitral valve is normal in structure. No evidence of mitral valve  regurgitation. No evidence of mitral stenosis.   4. The aortic valve is grossly normal. Aortic valve regurgitation is mild  to moderate. No aortic stenosis is present.   5. Pulmonic valve regurgitation not assessed.   6. Aortic dilatation noted. There is mild dilatation of the aortic root   measuring 40 mm.   7. The inferior vena cava is normal in size with greater than 50%  respiratory variability, suggesting right atrial pressure of 3 mmHg.   EKG: No EKG is ordered today.   Recent Labs: 03/25/2023: Magnesium 2.4 07/06/2023: ALT 15; BUN 11; Creatinine, Ser 0.84; Hemoglobin 14.8; Platelets 237; Potassium 4.1; Sodium 145; TSH 2.710  Recent Lipid Panel    Component Value Date/Time   CHOL 164 03/26/2023 0629   TRIG 70 03/26/2023 0629   HDL 49 03/26/2023 0629   CHOLHDL 3.3 03/26/2023 0629   VLDL 14 03/26/2023 0629   LDLCALC 101 (H) 03/26/2023 0629   LDLCALC 103 (H) 11/11/2021 1006   LDLDIRECT 98.5 01/30/2022 1535    PHYSICAL EXAM:    VS:  BP 122/77 (BP Location: Left Arm, Patient Position: Sitting, Cuff Size: Normal)   Pulse 67   Ht 5\' 9"  (1.753 m)   Wt 136 lb 6.4 oz (61.9 kg)   SpO2 97%   BMI 20.14 kg/m   BMI: Body mass index is 20.14 kg/m.  Physical Exam  Wt Readings from Last 3 Encounters:  07/13/23 136 lb 6.4 oz (61.9 kg)  07/06/23 138 lb (62.6 kg)  06/30/23 139 lb (63 kg)     ASSESSMENT & PLAN:   Coronary artery disease -LHC 11/28/2021 PCI/DES to LCx and LAD -He is without active symptoms of angina -No indication for ischemic evaluation -Continue Plavix 75 mg daily, Zetia 10 mg daily, propranolol 60 mg twice daily -He will need to speak with lipid clinic regarding PCSK9/statin intolerance -Okay to hold Plavix 5 days preop for upcoming EGD  HFpEF -Echo 03/25/2023 with EF 50 to 55%, no RWMA, mild LVH, RV systolic function normal, normal PASP. -Euvolemic and well compensated on exam.  Not currently requiring a loop diuretic -Due to multiple medication intolerances plan to defer MRA or SGLT2 -Only tolerated beta-blocker has been propranolol -Continue propranolol 60 mg twice daily -No leg swelling, shortness of breath, DOE -Continue daily weights, low-sodium diet, physical exercise daily  Hypertension -Blood pressure today 122/77 -Blood pressure  readings have been more consistent and less labile after discontinuing Effexor in 9/24 -Of note he is intolerant to a number of antihypertensive medications -Will have him to continue to do daily blood pressure logs, we spoke about being more consistent with the time that he takes his blood pressure every day -Continue propranolol 60 mg twice daily, hydralazine 25 mg twice daily with an additional PRN hydralazine for systolic BP >160  Hyperlipidemia -LDL 101 on 03/26/2023 -He stopped Repatha due to side of myalgia -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 Herbie Baltimore or an APP in  6 months.   Medication Adjustments/Labs and Tests Ordered: Current medicines are reviewed at length with the patient today.  Concerns regarding medicines are outlined above. Medication changes, Labs and Tests ordered today are summarized above and listed in the Patient Instructions accessible in Encounters.   Silvana Newness, [place] 07/13/2023 1:02 PM     [facility] [address] [place], [place] [address] [phone]

## 2023-07-13 ENCOUNTER — Ambulatory Visit: Payer: Medicare Other | Attending: Physician Assistant | Admitting: Emergency Medicine

## 2023-07-13 ENCOUNTER — Encounter: Payer: Self-pay | Admitting: Physician Assistant

## 2023-07-13 VITALS — BP 122/77 | HR 67 | Ht 69.0 in | Wt 136.4 lb

## 2023-07-13 DIAGNOSIS — R0989 Other specified symptoms and signs involving the circulatory and respiratory systems: Secondary | ICD-10-CM | POA: Diagnosis not present

## 2023-07-13 DIAGNOSIS — E785 Hyperlipidemia, unspecified: Secondary | ICD-10-CM | POA: Diagnosis not present

## 2023-07-13 DIAGNOSIS — I2510 Atherosclerotic heart disease of native coronary artery without angina pectoris: Secondary | ICD-10-CM | POA: Diagnosis not present

## 2023-07-13 DIAGNOSIS — I5030 Unspecified diastolic (congestive) heart failure: Secondary | ICD-10-CM | POA: Insufficient documentation

## 2023-07-13 NOTE — Patient Instructions (Signed)
Medication Instructions:  Your Physician recommend you continue on your current medication as directed.    *If you need a refill on your cardiac medications before your next appointment, please call your pharmacy*  Follow-Up: At [facility], you and your health needs are our priority.  As part of our continuing mission to provide you with exceptional heart care, we have created designated Provider Care Teams.  These Care Teams include your primary Cardiologist (physician) and Advanced Practice Providers (APPs -  Physician Assistants and Nurse Practitioners) who all work together to provide you with the care you need, when you need it.  We recommend signing up for the patient portal called "MyChart".  Sign up information is provided on this After Visit Summary.  MyChart is used to connect with patients for Virtual Visits (Telemedicine).  Patients are able to view lab/test results, encounter notes, upcoming appointments, etc.  Non-urgent messages can be sent to your provider as well.   To learn more about what you can do with MyChart, go to [email or web address].    Your next appointment:   6 month(s)  Provider:   You may see Bryan Lemma, MD or one of the following Advanced Practice Providers on your designated Care Team:

## 2023-07-27 ENCOUNTER — Ambulatory Visit: Payer: Medicare Other | Admitting: Certified Registered"

## 2023-07-27 ENCOUNTER — Encounter: Payer: Self-pay | Admitting: Gastroenterology

## 2023-07-27 ENCOUNTER — Other Ambulatory Visit: Payer: Self-pay

## 2023-07-27 ENCOUNTER — Encounter
Admission: RE | Disposition: A | Discharge status: Home / Self Care | Payer: Self-pay | Source: Home / Self Care | Attending: Gastroenterology

## 2023-07-27 ENCOUNTER — Ambulatory Visit
Admission: RE | Admit: 2023-07-27 | Discharge: 2023-07-27 | Disposition: A | Discharge status: Home / Self Care | Payer: Medicare Other | Attending: Gastroenterology | Admitting: Gastroenterology

## 2023-07-27 DIAGNOSIS — I252 Old myocardial infarction: Secondary | ICD-10-CM | POA: Insufficient documentation

## 2023-07-27 DIAGNOSIS — R1013 Epigastric pain: Secondary | ICD-10-CM

## 2023-07-27 DIAGNOSIS — I2510 Atherosclerotic heart disease of native coronary artery without angina pectoris: Secondary | ICD-10-CM | POA: Diagnosis not present

## 2023-07-27 DIAGNOSIS — F418 Other specified anxiety disorders: Secondary | ICD-10-CM | POA: Insufficient documentation

## 2023-07-27 DIAGNOSIS — K2900 Acute gastritis without bleeding: Secondary | ICD-10-CM | POA: Insufficient documentation

## 2023-07-27 DIAGNOSIS — I10 Essential (primary) hypertension: Secondary | ICD-10-CM | POA: Diagnosis not present

## 2023-07-27 DIAGNOSIS — Z955 Presence of coronary angioplasty implant and graft: Secondary | ICD-10-CM | POA: Insufficient documentation

## 2023-07-27 HISTORY — PX: ESOPHAGOGASTRODUODENOSCOPY (EGD) WITH PROPOFOL: SHX5813

## 2023-07-27 HISTORY — PX: BIOPSY: SHX5522

## 2023-07-27 SURGERY — ESOPHAGOGASTRODUODENOSCOPY (EGD) WITH PROPOFOL
Anesthesia: General

## 2023-07-27 MED ORDER — PROPOFOL 10 MG/ML IV BOLUS
INTRAVENOUS | Status: DC | PRN
  Administered 2023-07-27: 30 mg via INTRAVENOUS
  Administered 2023-07-27: 50 mg via INTRAVENOUS

## 2023-07-27 MED ORDER — LIDOCAINE HCL (CARDIAC) PF 100 MG/5ML IV SOSY
PREFILLED_SYRINGE | INTRAVENOUS | Status: DC | PRN
  Administered 2023-07-27: 80 mg via INTRAVENOUS

## 2023-07-27 MED ORDER — SODIUM CHLORIDE 0.9 % IV SOLN: INTRAVENOUS | Status: DC

## 2023-07-27 MED ORDER — PROPOFOL 500 MG/50ML IV EMUL
INTRAVENOUS | Status: DC | PRN
  Administered 2023-07-27: 100 ug/kg/min via INTRAVENOUS

## 2023-07-27 NOTE — Op Note (Signed)
[facility] Gastroenterology Patient Name: Grant Young Procedure Date: 07/27/2023 8:07 AM MRN: [id number] Account #: [id number] Date of Birth: September 12, 1946 Admit Type: Outpatient Age: 77 Room: [facility] ENDO ROOM 2 Gender: Male Note Status: Finalized Instrument Name: Upper Endoscope [id number] Procedure:             Upper GI endoscopy Indications:           Dyspepsia Providers:             Wyline Mood MD, MD Referring MD:          Wyline Mood MD, MD (Referring MD), Lorre Munroe                         (Referring MD) Medicines:             Monitored Anesthesia Care Complications:         No immediate complications. Procedure:             Pre-Anesthesia Assessment:                        - Prior to the procedure, a History and Physical was                         performed, and patient medications, allergies and                         sensitivities were reviewed. The patient's tolerance                         of previous anesthesia was reviewed.                        - The risks and benefits of the procedure and the                         sedation options and risks were discussed with the                         patient. All questions were answered and informed                         consent was obtained.                        - ASA Grade Assessment: II - A patient with mild                         systemic disease.                        After obtaining informed consent, the endoscope was                         passed under direct vision. Throughout the procedure,                         the patient's blood pressure, pulse, and oxygen                         saturations were monitored  continuously. The Endoscope                         was introduced through the mouth, and advanced to the                         third part of duodenum. The upper GI endoscopy was                         accomplished with ease. The patient tolerated the                          procedure well. Findings:      The esophagus was normal.      The examined duodenum was normal.      Three non-bleeding superficial gastric ulcers with a clean ulcer base       (Forrest Class III) were found at the pylorus. The largest lesion was 3       mm in largest dimension. Biopsies were taken with a cold forceps for       histology.      The cardia and gastric fundus were normal on retroflexion. Impression:            - Normal esophagus.                        - Normal examined duodenum.                        - Non-bleeding gastric ulcers with a clean ulcer base                         (Forrest Class III). Biopsied. Recommendation:        - Discharge patient to home (with escort).                        - Resume previous diet.                        - Continue present medications.                        - Await pathology results.                        - Return to my office as previously scheduled. Procedure Code(s):     --- Professional ---                        [phone], Esophagogastroduodenoscopy, flexible,                         transoral; with biopsy, single or multiple Diagnosis Code(s):     --- Professional ---                        R10.13, Epigastric pain                        K25.9, Gastric ulcer, unspecified as acute or chronic,  without hemorrhage or perforation CPT copyright 2022 American Medical Association. All rights reserved. The codes documented in this report are preliminary and upon coder review may  be revised to meet current compliance requirements. Wyline Mood, MD Wyline Mood MD, MD 07/27/2023 8:24:04 AM This report has been signed electronically. Number of Addenda: 0 Note Initiated On: 07/27/2023 8:07 AM Estimated Blood Loss:  Estimated blood loss: none.      [facility]

## 2023-07-27 NOTE — Anesthesia Preprocedure Evaluation (Signed)
Anesthesia Evaluation  Patient identified by MRN, date of birth, ID band Patient awake    Reviewed: Allergy & Precautions, NPO status , Patient's Chart, lab work & pertinent test results  History of Anesthesia Complications (+) PONV and history of anesthetic complications  Airway Mallampati: III  TM Distance: >3 FB Neck ROM: full    Dental no notable dental hx.    Pulmonary shortness of breath and with exertion   Pulmonary exam normal        Cardiovascular hypertension, On Medications + CAD, + Past MI and + Cardiac Stents  Normal cardiovascular exam     Neuro/Psych  Headaches PSYCHIATRIC DISORDERS Anxiety Depression     Neuromuscular disease    GI/Hepatic Neg liver ROS, hiatal hernia,GERD  ,,  Endo/Other  negative endocrine ROS    Renal/GU negative Renal ROS  negative genitourinary   Musculoskeletal   Abdominal   Peds  Hematology  (+) Blood dyscrasia, anemia   Anesthesia Other Findings Past Medical History: No date: Anemia No date: Aneurysm of ascending aorta (HCC)     Comment:  a.) CT chest 08/18/2022: 4.1 cm; b.) CT chest               12/07/2022: 4.2 cm No date: Anxiety     Comment:  a.) on BZO (diazepam) PRN No date: Aortic atherosclerosis (HCC) No date: Chronic back pain No date: Coronary artery disease     Comment:  a.) s/p PCI with DES x 2 (pLCx and o-mLAD) 11/27/2021 No date: Depression No date: Erectile dysfunction     Comment:  a.) Bi-mix (papaverine + phentolamine) injections +               exogenous testosterone injections No date: GERD (gastroesophageal reflux disease) No date: h/o Lyme disease No date: Headache No date: Hiatal hernia 01/2021: History of bilateral cataract extraction No date: History of gastritis No date: History of kidney stones No date: History of neuropathy No date: Hyperlipidemia LDL goal <70     Comment:  a.) CAD with NSTEMI -->  intolerant of statins with                statin myopathy and memory issues.;  Also intolerant of               Repatha No date: Hypertension     Comment:  Labile blood pressures but dizziness with blood               pressures in the "normal range"; intolerant of most               medications including ARB's, ACE inhibitor's, amlodipine               most beta-blockers other than propranolol. No date: Insomnia     Comment:  a.) takes malatonin PRN No date: Left lower lobe pulmonary nodule     Comment:  a.) chest CT 12/07/2022: nodules x 2 posteromedial LLL;               8 x 11 mm and 8 x 10 mm No date: Long term current use of antithrombotics/antiplatelets     Comment:  a.) DAPT (ASA + clopidogrel) No date: Long term current use of aromatase inhibitor     Comment:  a,) anastrozole --> estridiol suppression secondary to               exogenous testosterone use No date: Lumbar radicular pain No date: Myocardial infarction (HCC)  Comment:  a.) MI x 2 - 1990 * 1999 - PTCA 11/27/2021: NSTEMI (non-ST elevated myocardial infarction) [facility])     Comment:  a.) LHC 11/28/2021: sequential 75% o-pLAD, 70% OM2, 80%               p-mLAD, 99% mLAD, 100% pLCx --> PCI placing a 2.5 x 26 mm              Onyx Frontier DES to pLCx and a 2.5 x 24 mm Onyx Frontier              DES to the o-m LAD (covering 3 lesions) No date: Overactive bladder No date: Pneumonia No date: PONV (postoperative nausea and vomiting) No date: Recurrent herpes labialis     Comment:  a.) has suppressive valacyclovir to use PRN No date: Skin cancer, basal cell No date: Spinal cord stimulator status     Comment:  01/08/21 - not currently using. No date: Squamous cell skin cancer No date: Substance abuse [facility])  Past Surgical History: No date: BACK SURGERY No date: BASAL CELL CARCINOMA EXCISION 01/14/2021: CATARACT EXTRACTION W/PHACO; Left     Comment:  Procedure: CATARACT EXTRACTION PHACO AND INTRAOCULAR               LENS PLACEMENT (IOC) LEFT VIVITY TORIC  LENS 8.75 00:56.7;              Surgeon: Galen Manila, MD;  Location: [facility]              [facility];  Service: Ophthalmology;  Laterality: Left; 01/28/2021: CATARACT EXTRACTION W/PHACO; Right     Comment:  Procedure: CATARACT EXTRACTION PHACO AND INTRAOCULAR               LENS PLACEMENT (IOC) RIGHT VIVITY TORIC LENS;  Surgeon:               Galen Manila, MD;  Location: [facility];                Service: Ophthalmology;  Laterality: Right;                6.54 00:46.4 2017: CHOLECYSTECTOMY 09/15/2015: COLONOSCOPY     Comment:  Per New Patient Packet 10/03/2020: COLONOSCOPY WITH PROPOFOL; N/A     Comment:  Procedure: COLONOSCOPY WITH PROPOFOL;  Surgeon: Wyline Mood, MD;  Location: [facility] ENDOSCOPY;  Service:               Gastroenterology;  Laterality: N/A; 11/27/2021: CORONARY/GRAFT ACUTE MI REVASCULARIZATION; N/A     Comment:  Procedure: Coronary/Graft Acute MI Revascularization;                Surgeon: Marykay Lex, MD;  [facility] CATH:               (NSTEMI) - > 100% prox-mid LCx (Onyx Frontier DES 2.5 x               26 -> 2.7 mm, Ost  OM1 65%.  Ost-mid LAD 3 lesions 75%,               90%, 99% => DES PCI Onyx Frontier DES 2.5 x 34 -> 2.8 mm 10/03/2020: ESOPHAGOGASTRODUODENOSCOPY (EGD) WITH PROPOFOL; N/A     Comment:  Procedure: ESOPHAGOGASTRODUODENOSCOPY (EGD) WITH               PROPOFOL;  Surgeon: Wyline Mood, MD;  Location: [facility]               ENDOSCOPY;  Service: Gastroenterology;  Laterality: N/A; 03/18/2020: FOOT SURGERY; Bilateral 08/032021: FOOT SURGERY 09/15/2015: GALLBLADDER SURGERY     Comment:  Gallbladder Removal. Procedure done by Dr.Beverly. Per               New Patient Packet 09/14/1980: KIDNEY STONE SURGERY     Comment:  Too many to count. Per New Patient Packet               09/14/1980-09/15/1995 09/15/1995: KIDNEY STONE SURGERY     Comment:  Too many to count. Per New Patient Packet 11/27/2021: LEFT HEART CATH AND  CORONARY ANGIOGRAPHY; N/A     Comment:  Procedure: LEFT HEART CATH AND CORONARY ANGIOGRAPHY;                Surgeon: Marykay Lex, MD;  Location: [facility] CATH:                NSTEMI - 2 V CAD: 100% thrombotic prox-mid LCx (DES PCI),              ost OM1 65%; Ost LAD 75% - prox LAD 90%, prox-mid 99%               (DES PCI).  MIld diffuse RCA disease - calcicifed.  R-L               collaterals filling LCx. EF ~45-50% with lateral HK.               LVEDP 28 mmHg 09/14/2014: LITHOTRIPSY     Comment:  Per New Patient Packet 09/14/1996: LITHOTRIPSY     Comment:  No Stints Used. Per New Patient Packet 09/14/1997: LITHOTRIPSY     Comment:  No Stints used. Per New Patient Packet No date: PAIN PUMP IMPLANTATION No date: PAIN PUMP REMOVAL 1984: SHOULDER SURGERY; Right 09/14/2017: SIGMOIDOSCOPY     Comment:  Per New Patient Packet No date: SPINAL CORD STIMULATOR IMPLANT No date: TONSILLECTOMY 11/28/2021: TRANSTHORACIC ECHOCARDIOGRAM     Comment:  (in setting of non-STEMI): EF 50 to 55%.  Suspect               inferior and lateral hypokinesis.  Indeterminate               diastolic parameters.  Normal RV size and function.                Normal RAP.  Normal aortic and mitral valves with only               mild MR and AI.  Normal RVP and RAP. No date: UPPER GI ENDOSCOPY 01/15/2023: VIDEO BRONCHOSCOPY WITH ENDOBRONCHIAL ULTRASOUND; N/A     Comment:  Procedure: VIDEO BRONCHOSCOPY WITH ENDOBRONCHIAL               ULTRASOUND;  Surgeon: Vida Rigger, MD;  Location:               [facility] ORS;  Service: Thoracic;  Laterality: N/A;  BMI    Body Mass Index: 20.97 kg/m      Reproductive/Obstetrics negative OB ROS                             Anesthesia Physical Anesthesia Plan  ASA: 3  Anesthesia Plan: General   Post-op Pain Management: Minimal or no pain anticipated   Induction: Intravenous  PONV Risk Score and Plan: 2 and Propofol infusion and TIVA  Airway  Management Planned: Natural Airway and Nasal Cannula  Additional Equipment:   Intra-op Plan:   Post-operative Plan:   Informed Consent: I have reviewed the patients History and Physical, chart, labs and discussed the procedure including the risks, benefits and alternatives for the proposed anesthesia with the patient or authorized representative who has indicated his/her understanding and acceptance.     Dental Advisory Given  Plan Discussed with: Anesthesiologist, CRNA and Surgeon  Anesthesia Plan Comments: (Patient consented for risks of anesthesia including but not limited to:  - adverse reactions to medications - risk of airway placement if required - damage to eyes, teeth, lips or other oral mucosa - nerve damage due to positioning  - sore throat or hoarseness - Damage to heart, brain, nerves, lungs, other parts of body or loss of life  Patient voiced understanding and assent.)       Anesthesia Quick Evaluation

## 2023-07-27 NOTE — Anesthesia Postprocedure Evaluation (Signed)
Anesthesia Post Note  Patient: Grant Young  Procedure(s) Performed: ESOPHAGOGASTRODUODENOSCOPY (EGD) WITH PROPOFOL  Patient location during evaluation: Endoscopy Anesthesia Type: General Level of consciousness: awake and alert Pain management: pain level controlled Vital Signs Assessment: post-procedure vital signs reviewed and stable Respiratory status: spontaneous breathing, nonlabored ventilation, respiratory function stable and patient connected to nasal cannula oxygen Cardiovascular status: blood pressure returned to baseline and stable Postop Assessment: no apparent nausea or vomiting Anesthetic complications: no   No notable events documented.   Last Vitals:  Vitals:   07/27/23 0836 07/27/23 0846  BP: 128/71 (!) 146/82  Pulse: (!) 56 (!) 59  Resp: 20 17  Temp:    SpO2: 97% 98%    Last Pain:  Vitals:   07/27/23 0846  TempSrc:   PainSc: 0-No pain                 Louie Boston

## 2023-07-27 NOTE — Transfer of Care (Signed)
Immediate Anesthesia Transfer of Care Note  Patient: Grant Young  Procedure(s) Performed: ESOPHAGOGASTRODUODENOSCOPY (EGD) WITH PROPOFOL  Patient Location: PACU  Anesthesia Type:General  Level of Consciousness: sedated  Airway & Oxygen Therapy: Patient Spontanous Breathing  Post-op Assessment: Report given to RN and Post -op Vital signs reviewed and stable  Post vital signs: Reviewed and stable  Last Vitals:  Vitals Value Taken Time  BP 125/67 07/27/23 0826  Temp    Pulse 56 07/27/23 0826  Resp 21 07/27/23 0826  SpO2 98 % 07/27/23 0826    Last Pain:  Vitals:   07/27/23 0826  TempSrc:   PainSc: Asleep         Complications: No notable events documented.

## 2023-07-27 NOTE — H&P (Signed)
Wyline Mood, MD [address], [address], [place], [place], [address] [address], [place], [place], [address] Phone: [phone]  Fax: [phone]  Primary Care Physician:  Lorre Munroe, NP   Pre-Procedure History & Physical: HPI:  Grant Young is a 77 y.o. male is here for an endoscopy    Past Medical History:  Diagnosis Date   Anemia    Aneurysm of ascending aorta (HCC)    a.) CT chest 08/18/2022: 4.1 cm; b.) CT chest 12/07/2022: 4.2 cm   Anxiety    a.) on BZO (diazepam) PRN   Aortic atherosclerosis (HCC)    Chronic back pain    Coronary artery disease    a.) s/p PCI with DES x 2 (pLCx and o-mLAD) 11/27/2021   Depression    Erectile dysfunction    a.) Bi-mix (papaverine + phentolamine) injections + exogenous testosterone injections   GERD (gastroesophageal reflux disease)    h/o Lyme disease    Headache    Hiatal hernia    History of bilateral cataract extraction 01/2021   History of gastritis    History of kidney stones    History of neuropathy    Hyperlipidemia LDL goal <70    a.) CAD with NSTEMI -->  intolerant of statins with statin myopathy and memory issues.;  Also intolerant of Repatha   Hypertension    Labile blood pressures but dizziness with blood pressures in the "normal range"; intolerant of most medications including ARB's, ACE inhibitor's, amlodipine most beta-blockers other than propranolol.   Insomnia    a.) takes malatonin PRN   Left lower lobe pulmonary nodule    a.) chest CT 12/07/2022: nodules x 2 posteromedial LLL;  8 x 11 mm and 8 x 10 mm   Long term current use of antithrombotics/antiplatelets    a.) DAPT (ASA + clopidogrel)   Long term current use of aromatase inhibitor    a,) anastrozole --> estridiol suppression secondary to exogenous testosterone use   Lumbar radicular pain    Myocardial infarction (HCC)    a.) MI x 2 - 1990 * 1999 - PTCA   NSTEMI (non-ST elevated myocardial infarction) (HCC) 11/27/2021   a.) LHC  11/28/2021: sequential 75% o-pLAD, 70% OM2, 80% p-mLAD, 99% mLAD, 100% pLCx --> PCI placing a 2.5 x 26 mm Onyx Frontier DES to pLCx and a 2.5 x 24 mm Onyx Frontier DES to the o-m LAD (covering 3 lesions)   Overactive bladder    Pneumonia    PONV (postoperative nausea and vomiting)    Recurrent herpes labialis    a.) has suppressive valacyclovir to use PRN   Skin cancer, basal cell    Spinal cord stimulator status    01/08/21 - not currently using.   Squamous cell skin cancer    Substance abuse [facility])     Past Surgical History:  Procedure Laterality Date   BACK SURGERY     BASAL CELL CARCINOMA EXCISION     CATARACT EXTRACTION W/PHACO Left 01/14/2021   Procedure: CATARACT EXTRACTION PHACO AND INTRAOCULAR LENS PLACEMENT (IOC) LEFT VIVITY TORIC LENS 8.75 00:56.7;  Surgeon: Galen Manila, MD;  Location: [facility];  Service: Ophthalmology;  Laterality: Left;   CATARACT EXTRACTION W/PHACO Right 01/28/2021   Procedure: CATARACT EXTRACTION PHACO AND INTRAOCULAR LENS PLACEMENT (IOC) RIGHT VIVITY TORIC LENS;  Surgeon: Galen Manila, MD;  Location: [facility];  Service: Ophthalmology;  Laterality: Right;  6.54 00:46.4   CHOLECYSTECTOMY  2017  COLONOSCOPY  09/15/2015   Per New Patient Packet   COLONOSCOPY WITH PROPOFOL N/A 10/03/2020   Procedure: COLONOSCOPY WITH PROPOFOL;  Surgeon: Wyline Mood, MD;  Location: [facility];  Service: Gastroenterology;  Laterality: N/A;   CORONARY/GRAFT ACUTE MI REVASCULARIZATION N/A 11/27/2021   Procedure: Coronary/Graft Acute MI Revascularization;  Surgeon: Marykay Lex, MD;  Location: [facility] CATH: (NSTEMI) - > 100% prox-mid LCx (Onyx Frontier DES 2.5 x 26 -> 2.7 mm, Ost  OM1 65%.  Ost-mid LAD 3 lesions 75%, 90%, 99% => DES PCI Onyx Frontier DES 2.5 x 34 -> 2.8 mm   ESOPHAGOGASTRODUODENOSCOPY (EGD) WITH PROPOFOL N/A 10/03/2020   Procedure: ESOPHAGOGASTRODUODENOSCOPY (EGD) WITH PROPOFOL;  Surgeon: Wyline Mood, MD;  Location: [facility]  ENDOSCOPY;  Service: Gastroenterology;  Laterality: N/A;   FOOT SURGERY Bilateral 03/18/2020   FOOT SURGERY  08/032021   GALLBLADDER SURGERY  09/15/2015   Gallbladder Removal. Procedure done by Dr.Beverly. Per New Patient Packet   KIDNEY STONE SURGERY  09/14/1980   Too many to count. Per New Patient Packet 09/14/1980-09/15/1995   KIDNEY STONE SURGERY  09/15/1995   Too many to count. Per New Patient Packet   LEFT HEART CATH AND CORONARY ANGIOGRAPHY N/A 11/27/2021   Procedure: LEFT HEART CATH AND CORONARY ANGIOGRAPHY;  Surgeon: Marykay Lex, MD;  Location: [facility] CATH:  NSTEMI - 2 V CAD: 100% thrombotic prox-mid LCx (DES PCI), ost OM1 65%; Ost LAD 75% - prox LAD 90%, prox-mid 99% (DES PCI).  MIld diffuse RCA disease - calcicifed.  R-L collaterals filling LCx. EF ~45-50% with lateral HK. LVEDP 28 mmHg   LITHOTRIPSY  09/14/2014   Per New Patient Packet   LITHOTRIPSY  09/14/1996   No Stints Used. Per New Patient Packet   LITHOTRIPSY  09/14/1997   No Stints used. Per New Patient Packet   PAIN PUMP IMPLANTATION     PAIN PUMP REMOVAL     SHOULDER SURGERY Right 1984   SIGMOIDOSCOPY  09/14/2017   Per New Patient Packet   SPINAL CORD STIMULATOR IMPLANT     TONSILLECTOMY     TRANSTHORACIC ECHOCARDIOGRAM  11/28/2021   (in setting of non-STEMI): EF 50 to 55%.  Suspect inferior and lateral hypokinesis.  Indeterminate diastolic parameters.  Normal RV size and function.  Normal RAP.  Normal aortic and mitral valves with only mild MR and AI.  Normal RVP and RAP.   UPPER GI ENDOSCOPY     VIDEO BRONCHOSCOPY WITH ENDOBRONCHIAL ULTRASOUND N/A 01/15/2023   Procedure: VIDEO BRONCHOSCOPY WITH ENDOBRONCHIAL ULTRASOUND;  Surgeon: Vida Rigger, MD;  Location: [facility];  Service: Thoracic;  Laterality: N/A;    Prior to Admission medications   Medication Sig Start Date End Date Taking? Authorizing Provider  amitriptyline (ELAVIL) 10 MG tablet Take 1 tablet (10 mg total) by mouth at bedtime. 05/27/23 08/26/23  Yes Wyline Mood, MD  anastrozole (ARIMIDEX) 1 MG tablet 1/2 tablet (0.5 mg) by mouth once weekly 05/07/21  Yes [provider]  Butalbital-APAP-Caffeine 50-300-40 MG CAPS Take 1 capsule by mouth every 6 (six) hours as needed. 06/29/23  Yes Lorre Munroe, NP  cetirizine (ZYRTEC) 10 MG tablet Take 10 mg by mouth daily as needed for allergies.   Yes [provider]  Cholecalciferol 50 MCG (2000 UT) CAPS Take 1 tablet by mouth daily. 04/15/20  Yes [provider]  cholestyramine (QUESTRAN) 4 g packet TAKE 1 PACKET (4 G TOTAL) BY MOUTH 3 (THREE) TIMES DAILY. 10/16/20  Yes [provider]  Cyanocobalamin (VITAMIN B-12) 5000  MCG LOZG Take 1 lozenge by mouth daily.    Yes [provider]  DHEA 25 MG CAPS Take 25 mg by mouth daily.   Yes [provider]  diazepam (VALIUM) 5 MG tablet TAKE 1 TABLET IN THE MORNING AND 2 TABLETS AT BEDTIME 06/29/23  Yes [name], Salvadore Oxford, NP  dicyclomine (BENTYL) 10 MG capsule TAKE 1 CAPSULE (10 MG TOTAL) BY MOUTH 4 TIMES A DAY BEFORE MEALS AND AT BEDTIME 05/21/23  Yes Karamalegos, Netta Neat, DO  Digestive Aids Mixture (DIGESTION GB PO) Take 1 capsule by mouth daily.   Yes [provider]  escitalopram (LEXAPRO) 20 MG tablet Take 1 tablet (20 mg total) by mouth daily. 06/29/23  Yes [name], Salvadore Oxford, NP  ezetimibe (ZETIA) 10 MG tablet TAKE 1 TABLET BY MOUTH EVERY DAY 12/31/22  Yes [name], Salvadore Oxford, NP  hydrALAZINE (APRESOLINE) 25 MG tablet Take 1 tablet (25 mg total) by mouth 2 (two) times daily. In the morning and at dinner, may take extra tablet for BP greater than 160 05/06/23  Yes Marykay Lex, MD  lamoTRIgine (LAMICTAL) 25 MG tablet Take 2 tablets (50 mg total) by mouth at bedtime. 07/06/23  Yes Levert Feinstein, MD  Loperamide HCl (IMODIUM PO) Take by mouth as needed.    Yes [provider]  MAGNESIUM BISGLYCINATE PO Take by mouth daily.   Yes [provider]  melatonin 5 MG TABS Take 5 mg by mouth at  bedtime.   Yes [provider]  POTASSIUM PO Take 1 tablet by mouth as directed. Takes 5x weekly.   Yes [provider]  Probiotic Product (PROBIOTIC DAILY PO) Take by mouth daily.   Yes [provider]  prochlorperazine (COMPAZINE) 10 MG tablet Take 1 tablet (10 mg total) by mouth 2 (two) times daily as needed for nausea or vomiting. 07/07/22  Yes [name], Salvadore Oxford, NP  propranolol ER (INDERAL LA) 60 MG 24 hr capsule Take 1 capsule (60 mg total) by mouth 2 (two) times daily. At Lunch and Bedtime 05/06/23  Yes Marykay Lex, MD  pyridostigmine (MESTINON) 60 MG tablet Take 0.5 tablets (30 mg total) by mouth 2 (two) times daily. 07/05/23  Yes Lorre Munroe, NP  RABEprazole (ACIPHEX) 20 MG tablet TAKE 1 TABLET BY MOUTH TWICE A DAY 06/29/22  Yes [name], Salvadore Oxford, NP  valACYclovir (VALTREX) 1000 MG tablet Take 2 tabs p.o. and repeat in 12 hours as needed for cold sore 10/29/21  Yes [name], Salvadore Oxford, NP  clopidogrel (PLAVIX) 75 MG tablet TAKE 1 TABLET BY MOUTH ONCE DAILY. 12/17/22   Marykay Lex, MD  ipratropium (ATROVENT) 0.06 % nasal spray Place 1 spray into both nostrils 4 (four) times daily as needed for rhinitis. For up to 5-7 days then stop. 04/07/22   Karamalegos, Netta Neat, DO  NONFORMULARY OR COMPOUNDED ITEM Bi-Mix Papaverine 30mg , Phentolamine 1mg    Dosage: Inject 0.25cc-0.5cc as need for ED   Vial 1ml   Qty #5 Refills 6   [facility] [phone] Fax [phone] 05/28/23   Carman Ching, PA-C  Nutritional Supplements (NUTRITIONAL SUPPLEMENT PO) Take 1 capsule by mouth daily. [name]    [provider]  PREVIDENT 5000 DRY MOUTH 1.1 % GEL dental gel SMARTSIG:To Teeth 3 Times Daily 05/27/23   [provider]  sucralfate (CARAFATE) 1 g tablet Take 1 tablet (1 g total) by mouth 4 (four) times daily for 15 days. 04/20/23 07/13/23  Minna Antis, MD  testosterone cypionate (DEPOTESTOSTERONE CYPIONATE) 200 MG/ML  injection Inject 80 mg into the muscle every 14 (fourteen) days. 11/27/20   [provider]    Allergies as of 05/27/2023 - Review Complete 05/27/2023  Allergen Reaction Noted   Ace inhibitors  08/24/2019   Fluoxetine Anxiety 10/31/2019   Metoclopramide  10/31/2019   Nalbuphine  10/31/2019   Other  01/03/2014   Amoxicillin-pot clavulanate Nausea Only 10/31/2019   Doxazosin Rash 02/01/2013   Duloxetine Nausea Only 10/31/2019   Penicillins Nausea Only 10/31/2019   Tamsulosin Itching and Anxiety 02/01/2013   Trazodone and nefazodone Itching, Anxiety, and Rash 02/13/2012   Amlodipine  10/31/2019   Cinoxacin  10/31/2019   Ciprofloxacin  08/24/2019   Fludrocortisone Other (See Comments) 08/04/2021   Nebivolol  08/24/2019   Olanzapine  10/31/2019   Olmesartan  08/24/2019   Pregabalin  10/31/2019   Prostaglandins  05/07/2014   Thyroid hormones  06/09/2018   Zolpidem  10/31/2019   Duloxetine hcl  01/03/2014   Fluoxetine hcl  01/03/2014   Nucynta [tapentadol] Other (See Comments) 07/15/2021   Phenytoin Anxiety 10/31/2019    Family History  Problem Relation Age of Onset   Heart disease Father    Heart failure Father    Hypertension Father    Stroke Father    Stroke Mother    Dementia Mother    Kidney Stones Daughter    Anxiety disorder Daughter    OCD Daughter     Social History   Socioeconomic History   Marital status: Married    Spouse name: Therapist, art   Number of children: Not on file   Years of education: Not on file   Highest education level: Not on file  Occupational History   Not on file  Tobacco Use   Smoking status: Never    Passive exposure: Never   Smokeless tobacco: Never  Vaping Use   Vaping status: Never Used  Substance and Sexual Activity   Alcohol use: Yes    Comment: 1 Drink a Month, socially   Drug use: Not Currently   Sexual activity: Yes    Birth control/protection: None  Other Topics Concern   Not on file  Social History Narrative    Tobacco use, amount per day now: None   Past tobacco use, amount per day: None   How many years did you use tobacco: 0   Alcohol use (drinks per week): 0-1 Month   Diet:   Do you drink/eat things with caffeine: Occasionally ( Hot Chocolate and maybe 1/4 of 16oz Pepsi 2-3 times a week.   Marital status: Married                                  What year were you married? 1970   Do you live in a house, apartment, assisted living, condo, trailer, etc.? House   Is it one or more stories? One   How many persons live in your home? 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 Yes                                    Type and how often?  Barbells, Recumbent Bike, 3 times a week. Try to get a 1.2 mile walk at least 3 times a week or more.     Do you have a living will? Yes   Do you have a DNR form?  No                                 If not, do you want to discuss one?   Do you have signed POA/HPOA forms? Yes                       If so, please bring to you appointment    Do you have any difficulty bathing or dressing yourself? No    Do you have difficulty preparing food or eating? No   Do you have difficulty managing your medications? No   Do you have any difficulty managing your finances? No   Do you have any difficulty affording your medications? No         Social Determinants of Health   Financial Resource Strain: Low Risk  (12/03/2022)   Overall Financial Resource Strain (CARDIA)    Difficulty of Paying Living Expenses: Not hard at all  Food Insecurity: No Food Insecurity (03/30/2023)   Hunger Vital Sign    Worried About Running Out of Food in the Last Year: Never true    Ran Out of Food in the Last Year: Never true  Transportation Needs: No Transportation Needs (03/30/2023)   PRAPARE - Administrator, Civil Service  (Medical): No    Lack of Transportation (Non-Medical): No  Physical Activity: Insufficiently Active (12/03/2022)   Exercise Vital Sign    Days of Exercise per Week: 3 days    Minutes of Exercise per Session: 30 min  Stress: No Stress Concern Present (12/03/2022)   [employer] of Occupational Health - Occupational Stress Questionnaire    Feeling of Stress : Not at all  Social Connections: Moderately Integrated (12/03/2022)   Social Connection and Isolation Panel [NHANES]    Frequency of Communication with Friends and Family: Twice a week    Frequency of Social Gatherings with Friends and Family: More than three times a week    Attends Religious Services: More than 4 times per year    Active Member of [employer] or Organizations: No    Attends Banker Meetings: Never    Marital Status: Married  Catering manager Violence: Not At Risk (03/25/2023)   Humiliation, Afraid, Rape, and Kick questionnaire    Fear of Current or Ex-Partner: No    Emotionally Abused: No    Physically Abused: No    Sexually Abused: No    Review of Systems: See HPI, otherwise negative ROS  Physical Exam: BP (!) 132/96   Pulse 61   Temp (!) 96.2 F (35.7 C) (Temporal)   Wt 64.4 kg   SpO2 98%   BMI 20.97 kg/m  General:   Alert,  pleasant and cooperative in NAD Head:  Normocephalic and atraumatic. Neck:  Supple; no masses or thyromegaly. Lungs:  Clear throughout to auscultation, normal respiratory effort.    Heart:  +S1, +S2, Regular rate and rhythm, No edema. Abdomen:  Soft, nontender and nondistended. Normal bowel sounds, without guarding, and without rebound.   Neurologic:  Alert and  oriented x4;  grossly normal neurologically.  Impression/Plan: Grant Young is here for an endoscopy  to be performed  for  evaluation of dyspepsia    Risks, benefits, limitations, and alternatives regarding endoscopy have been reviewed with the patient.  Questions have been answered.  All parties  agreeable.   Wyline Mood, MD  07/27/2023, 8:05 AM

## 2023-07-28 ENCOUNTER — Encounter: Payer: Self-pay | Admitting: Gastroenterology

## 2023-07-29 ENCOUNTER — Encounter: Payer: Self-pay | Admitting: Internal Medicine

## 2023-07-29 LAB — SURGICAL PATHOLOGY

## 2023-07-30 MED ORDER — DIAZEPAM 5 MG PO TABS: ORAL_TABLET | ORAL | 0 refills | Status: DC

## 2023-08-02 ENCOUNTER — Telehealth: Payer: Self-pay

## 2023-08-02 ENCOUNTER — Telehealth: Payer: Self-pay | Admitting: Gastroenterology

## 2023-08-02 MED ORDER — SUCRALFATE 1 G PO TABS: 1.0000 g | ORAL_TABLET | Freq: Four times a day (QID) | ORAL | 3 refills | Status: DC

## 2023-08-02 MED ORDER — RABEPRAZOLE SODIUM 20 MG PO TBEC: 20.0000 mg | DELAYED_RELEASE_TABLET | Freq: Two times a day (BID) | ORAL | 3 refills | Status: DC

## 2023-08-02 NOTE — Telephone Encounter (Signed)
Patient called stating that he had a procedure done on 07/27/2023 and he stated that he saw the pathology report online and it stated that everything was normal. However, he stated that he had an appointment scheduled with Celso Amy until 08/26/2023 but he wanted to know what he is needing to do in the meantime. I asked him what medications he was currently taking and he stated that he is taking Carafate, Aciphex, Amitryptiline and digestive aids mixture and probiotics. Patient would like to know what else he is needing to do before he is seen. Please advise.

## 2023-08-02 NOTE — Telephone Encounter (Signed)
Please read other telephone call from today's date (08/02/2023).

## 2023-08-02 NOTE — Progress Notes (Unsigned)
Patient ID: CAYDAN DOUCET                 DOB: 05-Sep-1946                    MRN: [id number]      HPI: Grant Young is a 77 y.o. male patient referred to lipid clinic by Dr.Harding. PMH is significant for CAD, s/p PCI, statin myopathy, HLD, labile HTN, RLS, OAB, GERD, IBS.  Per Dr. Herbie Baltimore "Patient was on Repatha, had concerning side effects and therefore stopped taking them.  Question was could this been related to him with weaning off his pain medications versus true side effect.LDL went up from 34-105 being off of statin, and TC increased from 105-169 which is where he was 1 year ago"  Patient last seen in clinic on 03/24/23 by Carmela Hurt, PharmD. Patient reported Repatha made his chronic back pain and headache worse. He had stopped for about 3 months to see if that was the cause for worsening pain, pain was lessen during that 3 months and as soon as he restarted the injection within couple of weeks his pain worsened. He went off of Norco which he was on since 2021 but per him this whole trial of Repatha was before he going thorough dose tapering of Norco. Discussed other LDLc lowering options - Praluent, Leqvio and bempedoic acid.Patient agreed to try Praluent first.   Patient seen in clinic today for follow-up.  We discussed other LDLc lowering options - Praluent, Leqvio and bempedoic acid. Discussed mechanisms of action, dosing, side effects and potential decreases in LDL cholesterol.  Also reviewed cost information and potential options for patient assistance.  Current Medications: ezetimibe 10 mg daily  Intolerances: statins and Repatha - myalgia, Praluent (GI issues/headaches) Risk Factors: CAD, s/p PCI,  HLD, HTN,  LDL goal: <70 mg/dl   Labs: Lipid Panel     Component Value Date/Time   CHOL 164 03/26/2023 0629   TRIG 70 03/26/2023 0629   HDL 49 03/26/2023 0629   CHOLHDL 3.3 03/26/2023 0629   VLDL 14 03/26/2023 0629   LDLCALC 101 (H) 03/26/2023 0629   LDLCALC 103 (H)  11/11/2021 1006   LDLDIRECT 98.5 01/30/2022 1535    Past Medical History:  Diagnosis Date   Anemia    Aneurysm of ascending aorta (HCC)    a.) CT chest 08/18/2022: 4.1 cm; b.) CT chest 12/07/2022: 4.2 cm   Anxiety    a.) on BZO (diazepam) PRN   Aortic atherosclerosis (HCC)    Chronic back pain    Coronary artery disease    a.) s/p PCI with DES x 2 (pLCx and o-mLAD) 11/27/2021   Depression    Erectile dysfunction    a.) Bi-mix (papaverine + phentolamine) injections + exogenous testosterone injections   GERD (gastroesophageal reflux disease)    h/o Lyme disease    Headache    Hiatal hernia    History of bilateral cataract extraction 01/2021   History of gastritis    History of kidney stones    History of neuropathy    Hyperlipidemia LDL goal <70    a.) CAD with NSTEMI -->  intolerant of statins with statin myopathy and memory issues.;  Also intolerant of Repatha   Hypertension    Labile blood pressures but dizziness with blood pressures in the "normal range"; intolerant of most medications including ARB's, ACE inhibitor's, amlodipine most beta-blockers other than propranolol.   Insomnia    a.) takes malatonin  PRN   Left lower lobe pulmonary nodule    a.) chest CT 12/07/2022: nodules x 2 posteromedial LLL;  8 x 11 mm and 8 x 10 mm   Long term current use of antithrombotics/antiplatelets    a.) DAPT (ASA + clopidogrel)   Long term current use of aromatase inhibitor    a,) anastrozole --> estridiol suppression secondary to exogenous testosterone use   Lumbar radicular pain    Myocardial infarction (HCC)    a.) MI x 2 - 1990 * 1999 - PTCA   NSTEMI (non-ST elevated myocardial infarction) (HCC) 11/27/2021   a.) LHC 11/28/2021: sequential 75% o-pLAD, 70% OM2, 80% p-mLAD, 99% mLAD, 100% pLCx --> PCI placing a 2.5 x 26 mm Onyx Frontier DES to pLCx and a 2.5 x 24 mm Onyx Frontier DES to the o-m LAD (covering 3 lesions)   Overactive bladder    Pneumonia    PONV (postoperative nausea  and vomiting)    Recurrent herpes labialis    a.) has suppressive valacyclovir to use PRN   Skin cancer, basal cell    Spinal cord stimulator status    01/08/21 - not currently using.   Squamous cell skin cancer    Substance abuse (HCC)     Current Outpatient Medications on File Prior to Visit  Medication Sig Dispense Refill   amitriptyline (ELAVIL) 10 MG tablet Take 1 tablet (10 mg total) by mouth at bedtime. 90 tablet 0   anastrozole (ARIMIDEX) 1 MG tablet 1/2 tablet (0.5 mg) by mouth once weekly     Butalbital-APAP-Caffeine 50-300-40 MG CAPS Take 1 capsule by mouth every 6 (six) hours as needed. 90 capsule 0   cetirizine (ZYRTEC) 10 MG tablet Take 10 mg by mouth daily as needed for allergies.     Cholecalciferol 50 MCG (2000 UT) CAPS Take 1 tablet by mouth daily.     cholestyramine (QUESTRAN) 4 g packet TAKE 1 PACKET (4 G TOTAL) BY MOUTH 3 (THREE) TIMES DAILY.     clopidogrel (PLAVIX) 75 MG tablet TAKE 1 TABLET BY MOUTH ONCE DAILY. 90 tablet 2   Cyanocobalamin (VITAMIN B-12) 5000 MCG LOZG Take 1 lozenge by mouth daily.      DHEA 25 MG CAPS Take 25 mg by mouth daily.     diazepam (VALIUM) 5 MG tablet TAKE 1 TABLET IN THE MORNING AND 2 TABLETS AT BEDTIME 90 tablet 0   dicyclomine (BENTYL) 10 MG capsule TAKE 1 CAPSULE (10 MG TOTAL) BY MOUTH 4 TIMES A DAY BEFORE MEALS AND AT BEDTIME 90 capsule 1   Digestive Aids Mixture (DIGESTION GB PO) Take 1 capsule by mouth daily.     escitalopram (LEXAPRO) 20 MG tablet Take 1 tablet (20 mg total) by mouth daily. 90 tablet 1   ezetimibe (ZETIA) 10 MG tablet TAKE 1 TABLET BY MOUTH EVERY DAY 90 tablet 2   hydrALAZINE (APRESOLINE) 25 MG tablet Take 1 tablet (25 mg total) by mouth 2 (two) times daily. In the morning and at dinner, may take extra tablet for BP greater than 160 180 tablet 3   ipratropium (ATROVENT) 0.06 % nasal spray Place 1 spray into both nostrils 4 (four) times daily as needed for rhinitis. For up to 5-7 days then stop. 15 mL 0    lamoTRIgine (LAMICTAL) 25 MG tablet Take 2 tablets (50 mg total) by mouth at bedtime. 60 tablet 11   Loperamide HCl (IMODIUM PO) Take by mouth as needed.      MAGNESIUM BISGLYCINATE PO Take  by mouth daily.     melatonin 5 MG TABS Take 5 mg by mouth at bedtime.     NONFORMULARY OR COMPOUNDED ITEM Bi-Mix Papaverine 30mg , Phentolamine 1mg    Dosage: Inject 0.25cc-0.5cc as need for ED   Vial 1ml   Qty #5 Refills 6   [facility] [phone] Fax [phone] 5 each 6   Nutritional Supplements (NUTRITIONAL SUPPLEMENT PO) Take 1 capsule by mouth daily. [name]     POTASSIUM PO Take 1 tablet by mouth as directed. Takes 5x weekly.     PREVIDENT 5000 DRY MOUTH 1.1 % GEL dental gel SMARTSIG:To Teeth 3 Times Daily     Probiotic Product (PROBIOTIC DAILY PO) Take by mouth daily.     prochlorperazine (COMPAZINE) 10 MG tablet Take 1 tablet (10 mg total) by mouth 2 (two) times daily as needed for nausea or vomiting. 180 tablet 0   propranolol ER (INDERAL LA) 60 MG 24 hr capsule Take 1 capsule (60 mg total) by mouth 2 (two) times daily. At [facility] and Bedtime 180 capsule 3   pyridostigmine (MESTINON) 60 MG tablet Take 0.5 tablets (30 mg total) by mouth 2 (two) times daily. 120 tablet 0   RABEprazole (ACIPHEX) 20 MG tablet Take 1 tablet (20 mg total) by mouth 2 (two) times daily. 180 tablet 3   sucralfate (CARAFATE) 1 g tablet Take 1 tablet (1 g total) by mouth 4 (four) times daily. 360 tablet 3   testosterone cypionate (DEPOTESTOSTERONE CYPIONATE) 200 MG/ML injection Inject 80 mg into the muscle every 14 (fourteen) days.     valACYclovir (VALTREX) 1000 MG tablet Take 2 tabs p.o. and repeat in 12 hours as needed for cold sore 30 tablet 0   No current facility-administered medications on file prior to visit.    Allergies  Allergen Reactions   Ace Inhibitors     Other reaction(s): Cough   Fluoxetine Anxiety    "made me fall asleep" per pt "bad headaches and "makes  Me   Crazy" historical allergy noted in [employer] "made me fall asleep" per pt "bad headaches and "makes  Me  Crazy" Per New Patient Packet.     Metoclopramide     Other reaction(s): Other (See Comments), Other (See Comments), Unknown Tardive Dyskinesia  historical allergy noted in [employer] Tardive Dyskinesia  Per New Patient Packet.   Nalbuphine     Used Post Back surgery- Anesthesiologist Error. Patient had Narcotic Withdraw. Per New Patient Packet.    Other     Other reaction(s): Other (See Comments) Altered mental status in combo with narcotics at previous hospitalization - Full Withdrawal Symptoms Other reaction(s): Rash   Amoxicillin-Pot Clavulanate Nausea Only    Per New Patient Packet.   Doxazosin Rash    Other reaction(s): Other - See Comments, Rash UNKNOWN REACTION UNKNOWN REACTION    Duloxetine Nausea Only    Per New Patient Packet.    Penicillins Nausea Only    Per New Patient Packet.   Tamsulosin Itching and Anxiety    Restless, Flushing, Heavy Chest, Itching, Hyperactive mood and Anxiety. Unable to handle side effects. Per New Patient Packet.     Trazodone And Nefazodone Itching, Anxiety and Rash    Headache. "INCREASED MY ANXIETY AND HEARTRATE" Flushing, tachycardia "INCREASED MY ANXIETY AND HEARTRATE" Per New Patient Packet.    Amlodipine     Shaking, unsure of reaction. Per New Patient Packet.     Cinoxacin     GI Intolerance, and Dizziness. Per New  Patient Packet.    Ciprofloxacin     Other reaction(s): Unknown   Fludrocortisone Other (See Comments)    "Worsening headaches, GI issues, fatigue"   Nebivolol     Other reaction(s): Unknown   Olanzapine     Headache and unable to sleep for 3 nights. Per New Patient Packet.    Olmesartan     Other reaction(s): Unknown   Pregabalin     Confusion, Lack of concentration, dizziness, and likely drowsiness. Per New Patient Packet.     Prostaglandins     Other reaction(s): Other (See Comments) Intolerance    Thyroid Hormones     Other reaction(s): Other (See Comments) Thyroid (Nature Thyroid) contraindicated with some of your other medications.   Zolpidem     Nightmares, Ineffective after 2 days. Per New Patient Packet.    Duloxetine Hcl     Other reaction(s): Rash   Fluoxetine Hcl     Other reaction(s): Rash   Nucynta [Tapentadol] Other (See Comments)    Vertigo    Phenytoin Anxiety    Hyperactivity, and Ineffective. Per New Patient Packet.     Assessment/Plan:  1. Hyperlipidemia -  No problems updated.  No problem-specific Assessment & Plan notes found for this encounter. Assessment: Patient mentioned starting simvastatin on 07/21/23 via MyChart msg. Patient stated he did well on it about 8-10 years ago while living in [place] Patient is currently on ezetimibe 10mg  monotherapy  Patient has previous statin intolerances (atorvastatin) and intolerant to PCSK9i Discuss Lequivo  Plan:    Thank you,  Minette Brine, Student Pharmacist  Olene Floss, Pharm.D, BCACP, BCPS, CPP [facility] [address] [place], [place] [address]  Phone: [phone]; Fax: [phone]

## 2023-08-02 NOTE — Addendum Note (Signed)
Addended by: Adela Ports on: 08/02/2023 01:52 PM   Modules accepted: Orders

## 2023-08-02 NOTE — Telephone Encounter (Signed)
The patient called in for advice from his EGD. Patient asked what medication he needs to take to treat his Ulcers.

## 2023-08-02 NOTE — Telephone Encounter (Signed)
Called patient and gave him the below recommendations from Dr. Tobi Bastos. Patient stated that he was currently taking Aciphex 20 MG BID and no carafate. Therefore, I told him that Dr. Tobi Bastos wants him to take Aciphex 40 MG BID and Carafate 1g four times a day. Patient requested a 90 days supply sent to his pharmacy. I told him that I would do that. Dr. Tobi Bastos came to my desk when I was about to send him the message to him and he was able to read it and he agreed with everything and he also wanted me to call the patient back and let him know that Dr. Tobi Bastos wanted her to have a Video visit in 2 months to check up on him. I then called the patient and he agreed. Appointment had been scheduled.

## 2023-08-02 NOTE — Progress Notes (Deleted)
Patient ID: Grant Young                 DOB: 1946/02/24                    MRN: [id number]      HPI: Grant Young is a 77 y.o. male patient referred to lipid clinic by  ***. PMH is significant for CAD with MI x2 in 1990 and 1999 s/p PTCA and with NSTEMI in 3/23 s/p PCI/DES to the LAD and Lcx, HFpEF, HTN, HLD, and headaches.   Patient was last seen by Grant Paganini, NP on 07/13/23.   Reviewed options for lowering LDL cholesterol, including bempedoic acid and inclisiran.  Discussed mechanisms of action, dosing, side effects and potential decreases in LDL cholesterol.  Also reviewed cost information and potential options for patient assistance.  Current Medications: ezetimibe 10mg  PO daily Intolerances: Repatha (worsening back pain/myalgia), Praluent (headaches/GI issues), statins (myalgia/headaches) Risk Factors: CAD with MI x2, NSTEMI (11/2021),  LDL-C goal: <55 ApoB goal:   Diet: ***  Exercise: ***  Family History: The patient's family history includes Anxiety disorder in his daughter; Dementia in his mother; T2DM in his mother; Heart disease in his father; Heart failure in his father; Hypertension in his father; Kidney Stones in his daughter; OCD in his daughter; Stroke in his father and mother.   Social History:  Social History   Socioeconomic History   Marital status: Married    Spouse name: Grant Young   Number of children: Not on file   Years of education: Not on file   Highest education level: Not on file  Occupational History   Not on file  Tobacco Use   Smoking status: Never    Passive exposure: Never   Smokeless tobacco: Never  Vaping Use   Vaping status: Never Used  Substance and Sexual Activity   Alcohol use: Yes    Comment: 1 Drink a Month, socially   Drug use: Not Currently   Sexual activity: Yes    Birth control/protection: None  Other Topics Concern   Not on file  Social History Narrative   Tobacco use, amount per day now: None   Past tobacco use,  amount per day: None   How many years did you use tobacco: 0   Alcohol use (drinks per week): 0-1 Month   Diet:   Do you drink/eat things with caffeine: Occasionally ( Hot Chocolate and maybe 1/4 of 16oz Pepsi 2-3 times a week.   Marital status: Married                                  What year were you married? 1970   Do you live in a house, apartment, assisted living, condo, trailer, etc.? House   Is it one or more stories? One   How many persons live in your home? 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 Yes                                    Type and how often? [place], Recumbent Bike, 3 times a week. Try to get a 1.2  mile walk at least 3 times a week or more.     Do you have a living will? Yes   Do you have a DNR form?  No                                 If not, do you want to discuss one?   Do you have signed POA/HPOA forms? Yes                       If so, please bring to you appointment    Do you have any difficulty bathing or dressing yourself? No    Do you have difficulty preparing food or eating? No   Do you have difficulty managing your medications? No   Do you have any difficulty managing your finances? No   Do you have any difficulty affording your medications? No         Social Determinants of Health   Young Resource Strain: Low Risk  (07/29/2023)   Received from [facility]   Overall Young Resource Strain (CARDIA)    Difficulty of Paying Living Expenses: Not hard at all  Food Insecurity: No Food Insecurity (07/29/2023)   Received from [facility]   Hunger Vital Sign    Worried About Running Out of Food in the Last Year: Never true    Ran Out of Food in the Last Year: Never true  Transportation Needs: No Transportation Needs (07/29/2023)   Received from  [facility] - Transportation    In the past 12 months, has lack of transportation kept you from medical appointments or from getting medications?: No    Lack of Transportation (Non-Medical): No  Physical Activity: Insufficiently Active (12/03/2022)   Exercise Vital Sign    Days of Exercise per Week: 3 days    Minutes of Exercise per Session: 30 min  Stress: No Stress Concern Present (12/03/2022)   [employer] of Occupational Health - Occupational Stress Questionnaire    Feeling of Stress : Not at all  Social Connections: Moderately Integrated (12/03/2022)   Social Connection and Isolation Panel [NHANES]    Frequency of Communication with Friends and Family: Twice a week    Frequency of Social Gatherings with Friends and Family: More than three times a week    Attends Religious Services: More than 4 times per year    Active Member of Grant Young or Organizations: No    Attends Banker Meetings: Never    Marital Status: Married  Catering manager Violence: Not At Risk (03/25/2023)   Humiliation, Afraid, Rape, and Kick questionnaire    Fear of Current or Ex-Partner: No    Emotionally Abused: No    Physically Abused: No    Sexually Abused: No     Labs: Lipid Panel     Component Value Date/Time   CHOL 164 03/26/2023 0629   TRIG 70 03/26/2023 0629   HDL 49 03/26/2023 0629   CHOLHDL 3.3 03/26/2023 0629   VLDL 14 03/26/2023 0629   LDLCALC 101 (H) 03/26/2023 0629   LDLCALC 103 (H) 11/11/2021 1006   LDLDIRECT 98.5 01/30/2022 1535    Past Medical History:  Diagnosis Date   Anemia    Aneurysm of ascending aorta (HCC)    a.) CT chest 08/18/2022: 4.1 cm; b.) CT chest 12/07/2022: 4.2 cm   Anxiety  a.) on BZO (diazepam) PRN   Aortic atherosclerosis (HCC)    Chronic back pain    Coronary artery disease    a.) s/p PCI with DES x 2 (pLCx and o-mLAD) 11/27/2021   Depression    Erectile dysfunction    a.) Bi-mix (papaverine + phentolamine)  injections + exogenous testosterone injections   GERD (gastroesophageal reflux disease)    h/o Lyme disease    Headache    Hiatal hernia    History of bilateral cataract extraction 01/2021   History of gastritis    History of kidney stones    History of neuropathy    Hyperlipidemia LDL goal <70    a.) CAD with NSTEMI -->  intolerant of statins with statin myopathy and memory issues.;  Also intolerant of Repatha   Hypertension    Labile blood pressures but dizziness with blood pressures in the "normal range"; intolerant of most medications including ARB's, ACE inhibitor's, amlodipine most beta-blockers other than propranolol.   Insomnia    a.) takes malatonin PRN   Left lower lobe pulmonary nodule    a.) chest CT 12/07/2022: nodules x 2 posteromedial LLL;  8 x 11 mm and 8 x 10 mm   Long term current use of antithrombotics/antiplatelets    a.) DAPT (ASA + clopidogrel)   Long term current use of aromatase inhibitor    a,) anastrozole --> estridiol suppression secondary to exogenous testosterone use   Lumbar radicular pain    Myocardial infarction (HCC)    a.) MI x 2 - 1990 * 1999 - PTCA   NSTEMI (non-ST elevated myocardial infarction) (HCC) 11/27/2021   a.) LHC 11/28/2021: sequential 75% o-pLAD, 70% OM2, 80% p-mLAD, 99% mLAD, 100% pLCx --> PCI placing a 2.5 x 26 mm Onyx Frontier DES to pLCx and a 2.5 x 24 mm Onyx Frontier DES to the o-m LAD (covering 3 lesions)   Overactive bladder    Pneumonia    PONV (postoperative nausea and vomiting)    Recurrent herpes labialis    a.) has suppressive valacyclovir to use PRN   Skin cancer, basal cell    Spinal cord stimulator status    01/08/21 - not currently using.   Squamous cell skin cancer    Substance abuse (HCC)     Current Outpatient Medications on File Prior to Visit  Medication Sig Dispense Refill   amitriptyline (ELAVIL) 10 MG tablet Take 1 tablet (10 mg total) by mouth at bedtime. 90 tablet 0   anastrozole (ARIMIDEX) 1 MG tablet  1/2 tablet (0.5 mg) by mouth once weekly     Butalbital-APAP-Caffeine 50-300-40 MG CAPS Take 1 capsule by mouth every 6 (six) hours as needed. 90 capsule 0   cetirizine (ZYRTEC) 10 MG tablet Take 10 mg by mouth daily as needed for allergies.     Cholecalciferol 50 MCG (2000 UT) CAPS Take 1 tablet by mouth daily.     cholestyramine (QUESTRAN) 4 g packet TAKE 1 PACKET (4 G TOTAL) BY MOUTH 3 (THREE) TIMES DAILY.     clopidogrel (PLAVIX) 75 MG tablet TAKE 1 TABLET BY MOUTH ONCE DAILY. 90 tablet 2   Cyanocobalamin (VITAMIN B-12) 5000 MCG LOZG Take 1 lozenge by mouth daily.      DHEA 25 MG CAPS Take 25 mg by mouth daily.     diazepam (VALIUM) 5 MG tablet TAKE 1 TABLET IN THE MORNING AND 2 TABLETS AT BEDTIME 90 tablet 0   dicyclomine (BENTYL) 10 MG capsule TAKE 1 CAPSULE (10 MG  TOTAL) BY MOUTH 4 TIMES A DAY BEFORE MEALS AND AT BEDTIME 90 capsule 1   Digestive Aids Mixture (DIGESTION GB PO) Take 1 capsule by mouth daily.     escitalopram (LEXAPRO) 20 MG tablet Take 1 tablet (20 mg total) by mouth daily. 90 tablet 1   ezetimibe (ZETIA) 10 MG tablet TAKE 1 TABLET BY MOUTH EVERY DAY 90 tablet 2   hydrALAZINE (APRESOLINE) 25 MG tablet Take 1 tablet (25 mg total) by mouth 2 (two) times daily. In the morning and at dinner, may take extra tablet for BP greater than 160 180 tablet 3   ipratropium (ATROVENT) 0.06 % nasal spray Place 1 spray into both nostrils 4 (four) times daily as needed for rhinitis. For up to 5-7 days then stop. 15 mL 0   lamoTRIgine (LAMICTAL) 25 MG tablet Take 2 tablets (50 mg total) by mouth at bedtime. 60 tablet 11   Loperamide HCl (IMODIUM PO) Take by mouth as needed.      MAGNESIUM BISGLYCINATE PO Take by mouth daily.     melatonin 5 MG TABS Take 5 mg by mouth at bedtime.     NONFORMULARY OR COMPOUNDED ITEM Bi-Mix Papaverine 30mg , Phentolamine 1mg    Dosage: Inject 0.25cc-0.5cc as need for ED   Vial 1ml   Qty #5 Refills 6   [facility] [phone] Fax [phone] 5  each 6   Nutritional Supplements (NUTRITIONAL SUPPLEMENT PO) Take 1 capsule by mouth daily. [name]     POTASSIUM PO Take 1 tablet by mouth as directed. Takes 5x weekly.     PREVIDENT 5000 DRY MOUTH 1.1 % GEL dental gel SMARTSIG:To Teeth 3 Times Daily     Probiotic Product (PROBIOTIC DAILY PO) Take by mouth daily.     prochlorperazine (COMPAZINE) 10 MG tablet Take 1 tablet (10 mg total) by mouth 2 (two) times daily as needed for nausea or vomiting. 180 tablet 0   propranolol ER (INDERAL LA) 60 MG 24 hr capsule Take 1 capsule (60 mg total) by mouth 2 (two) times daily. At [facility] and Bedtime 180 capsule 3   pyridostigmine (MESTINON) 60 MG tablet Take 0.5 tablets (30 mg total) by mouth 2 (two) times daily. 120 tablet 0   RABEprazole (ACIPHEX) 20 MG tablet Take 1 tablet (20 mg total) by mouth 2 (two) times daily. 180 tablet 3   sucralfate (CARAFATE) 1 g tablet Take 1 tablet (1 g total) by mouth 4 (four) times daily. 360 tablet 3   testosterone cypionate (DEPOTESTOSTERONE CYPIONATE) 200 MG/ML injection Inject 80 mg into the muscle every 14 (fourteen) days.     valACYclovir (VALTREX) 1000 MG tablet Take 2 tabs p.o. and repeat in 12 hours as needed for cold sore 30 tablet 0   No current facility-administered medications on file prior to visit.    Allergies  Allergen Reactions   Ace Inhibitors     Other reaction(s): Cough   Fluoxetine Anxiety    "made me fall asleep" per pt "bad headaches and "makes  Me  Crazy" historical allergy noted in [employer] "made me fall asleep" per pt "bad headaches and "makes  Me  Crazy" Per New Patient Packet.     Metoclopramide     Other reaction(s): Other (See Comments), Other (See Comments), Unknown Tardive Dyskinesia  historical allergy noted in [employer] Tardive Dyskinesia  Per New Patient Packet.   Nalbuphine     Used Post Back surgery- Anesthesiologist Error. Patient had Narcotic Withdraw. Per New Patient Packet.  Other     Other  reaction(s): Other (See Comments) Altered mental status in combo with narcotics at previous hospitalization - Full Withdrawal Symptoms Other reaction(s): Rash   Amoxicillin-Pot Clavulanate Nausea Only    Per New Patient Packet.   Doxazosin Rash    Other reaction(s): Other - See Comments, Rash UNKNOWN REACTION UNKNOWN REACTION    Duloxetine Nausea Only    Per New Patient Packet.    Penicillins Nausea Only    Per New Patient Packet.   Tamsulosin Itching and Anxiety    Restless, Flushing, Heavy Chest, Itching, Hyperactive mood and Anxiety. Unable to handle side effects. Per New Patient Packet.     Trazodone And Nefazodone Itching, Anxiety and Rash    Headache. "INCREASED MY ANXIETY AND HEARTRATE" Flushing, tachycardia "INCREASED MY ANXIETY AND HEARTRATE" Per New Patient Packet.    Amlodipine     Shaking, unsure of reaction. Per New Patient Packet.     Cinoxacin     GI Intolerance, and Dizziness. Per New Patient Packet.    Ciprofloxacin     Other reaction(s): Unknown   Fludrocortisone Other (See Comments)    "Worsening headaches, GI issues, fatigue"   Nebivolol     Other reaction(s): Unknown   Olanzapine     Headache and unable to sleep for 3 nights. Per New Patient Packet.    Olmesartan     Other reaction(s): Unknown   Pregabalin     Confusion, Lack of concentration, dizziness, and likely drowsiness. Per New Patient Packet.     Prostaglandins     Other reaction(s): Other (See Comments) Intolerance   Thyroid Hormones     Other reaction(s): Other (See Comments) Thyroid (Nature Thyroid) contraindicated with some of your other medications.   Zolpidem     Nightmares, Ineffective after 2 days. Per New Patient Packet.    Duloxetine Hcl     Other reaction(s): Rash   Fluoxetine Hcl     Other reaction(s): Rash   Nucynta [Tapentadol] Other (See Comments)    Vertigo    Phenytoin Anxiety    Hyperactivity, and Ineffective. Per New Patient Packet.     Assessment/Plan:  1.  Hyperlipidemia -  No problem-specific Assessment & Plan notes found for this encounter. Assessment: Patient mentioned starting simvastatin on 07/21/23 via MyChart msg. Patient stated he did well on it about 8-10 years ago while living in [place] Patient is currently on ezetimibe 10mg  monotherapy  Patient has previous statin intolerances (atorvastatin) and intolerant to PCSK9i Discuss   Plan:    Thank you,  Olene Floss, Pharm.D, BCACP, BCPS, CPP [facility] [address] [place], [place] [address]  Phone: [phone]; Fax: [phone]

## 2023-08-03 ENCOUNTER — Ambulatory Visit: Payer: Medicare Other | Attending: Cardiovascular Disease | Admitting: Pharmacist

## 2023-08-03 DIAGNOSIS — T466X5A Adverse effect of antihyperlipidemic and antiarteriosclerotic drugs, initial encounter: Secondary | ICD-10-CM | POA: Diagnosis present

## 2023-08-03 DIAGNOSIS — E785 Hyperlipidemia, unspecified: Secondary | ICD-10-CM | POA: Diagnosis not present

## 2023-08-03 DIAGNOSIS — G720 Drug-induced myopathy: Secondary | ICD-10-CM | POA: Diagnosis not present

## 2023-08-03 DIAGNOSIS — T466X5D Adverse effect of antihyperlipidemic and antiarteriosclerotic drugs, subsequent encounter: Secondary | ICD-10-CM | POA: Diagnosis not present

## 2023-08-03 MED ORDER — EZETIMIBE-SIMVASTATIN 10-20 MG PO TABS: 1.0000 | ORAL_TABLET | Freq: Every day | ORAL | 11 refills | Status: DC

## 2023-08-03 NOTE — Assessment & Plan Note (Addendum)
Assessment: Patient wanted to review all of his prescribed and over the counter medication he is currently taking. Discussed options for him to bring to the prescribing practitioners  Patient mentioned starting simvastatin on 07/21/23 via MyChart msg. Patient stated he did well on it about 8-10 years ago while living in [place] Patient is currently on ezetimibe 10mg  monotherapy  Patient has previous statin intolerances (atorvastatin) and intolerant to PCSK9i Overall the patients diet is not unhealthy and is exercising by doing water aerobics and some strength training Encouraged patient to continue the good diet and aerobic exercise along with strength training.   Plan: Start ezetimibe-simvastatin 10-20mg  PO daily Stop ezetimibe 10mg  PO daily Follow up in 2-3 months for lipid panel

## 2023-08-03 NOTE — Patient Instructions (Addendum)
STOP ezetimibe START simvastatin/ezetimibe 20/10mg  daily [facility] go to [facility] in 2-3 months for fasting labs

## 2023-08-09 ENCOUNTER — Encounter: Payer: Self-pay | Admitting: Neurology

## 2023-08-18 ENCOUNTER — Ambulatory Visit (INDEPENDENT_AMBULATORY_CARE_PROVIDER_SITE_OTHER): Payer: Medicare Other | Admitting: Neurology

## 2023-08-18 ENCOUNTER — Ambulatory Visit (INDEPENDENT_AMBULATORY_CARE_PROVIDER_SITE_OTHER): Payer: Self-pay | Admitting: Neurology

## 2023-08-18 VITALS — BP 155/80 | HR 66 | Ht 69.0 in | Wt 138.0 lb

## 2023-08-18 DIAGNOSIS — R519 Headache, unspecified: Secondary | ICD-10-CM

## 2023-08-18 DIAGNOSIS — R799 Abnormal finding of blood chemistry, unspecified: Secondary | ICD-10-CM

## 2023-08-18 DIAGNOSIS — Z0289 Encounter for other administrative examinations: Secondary | ICD-10-CM

## 2023-08-18 DIAGNOSIS — R790 Abnormal level of blood mineral: Secondary | ICD-10-CM

## 2023-08-18 DIAGNOSIS — G629 Polyneuropathy, unspecified: Secondary | ICD-10-CM | POA: Diagnosis not present

## 2023-08-18 DIAGNOSIS — R251 Tremor, unspecified: Secondary | ICD-10-CM

## 2023-08-18 DIAGNOSIS — K9089 Other intestinal malabsorption: Secondary | ICD-10-CM

## 2023-08-18 MED ORDER — PROPRANOLOL HCL 10 MG PO TABS: 10.0000 mg | ORAL_TABLET | Freq: Two times a day (BID) | ORAL | 11 refills | Status: DC | PRN

## 2023-08-18 MED ORDER — AMITRIPTYLINE HCL 10 MG PO TABS: 30.0000 mg | ORAL_TABLET | Freq: Every day | ORAL | 11 refills | Status: DC

## 2023-08-18 NOTE — Progress Notes (Signed)
No chief complaint on file.     ASSESSMENT AND PLAN  Grant Young is a 77 y.o. male  Constellation of complaints Tremor  Mainly mild posturing component, no parkinsonian features, check TSH, Chronic headache,  In the setting of depression, anxiety, overuse of daily multiple dose of Fioricet, likely medicine rebound component,  Suggesting tapering off daily Fioricet use, add on lamotrigine 25 titrating to 50 mg every night as preventive medications, use Fioricet 2-3 times each week Peripheral neuropathy  Mild length-dependent sensory loss,  EMG nerve conduction study  Laboratory evaluation for treatable etiology   DIAGNOSTIC DATA (LABS, IMAGING, TESTING) - I reviewed patient records, labs, notes, testing and imaging myself where available.   MEDICAL HISTORY:  Grant Young, is a 77 year old male seen in request by his primary care nurse practitioner Lorre Munroe, for evaluation of constellation of complaints initial evaluation July 06, 2023  History is obtained from the patient and review of electronic medical records. I personally reviewed pertinent available imaging films in PACS.   PMHx of  CAD Depression, Anxiety, chronic insomnia, Valium 5/10 Chronic migraine, Fioricet 1-2 daily HLD HTN Lumbar decompression surgery Chronic pain, has spinal stimulator, not function OSA- mild, study in 2023, no need for CPAP  Patient complains of headaches since 2019, starting at occipital region spreading forward to settle at the frontal region, almost daily 5 out of 10 headache, most noticeable in the morning, either when he first wake up, or after breakfast, photophobia, but denies significant phonophobia, throbbing headache, over the years, he has been taking Fioricet almost on a daily basis, described worsening headache without his Fioricet  He was seen by Outside neurologist Dr. 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 No evidence of acute fracture to the cervical spine.  2. Mild C5-C6 grade 1 anterolisthesis.  3. Cervical spondylosis as described and greatest at C6-C7.   UPDATE Dec 4th 2024:    PHYSICAL EXAM:      08/18/2023    1:18 PM 07/27/2023    8:46 AM 07/27/2023    8:36 AM  Vitals with BMI  Height 5\' 9"     Weight 138 lbs    BMI 20.37    Systolic 155 146 086  Diastolic 80 82 71  Pulse 66 59 56     PHYSICAL EXAMNIATION:  Gen: NAD, conversant, well nourised, well groomed                     Cardiovascular: Regular rate rhythm, no peripheral edema, warm, nontender. Eyes: Conjunctivae clear without exudates or hemorrhage Neck: Supple, no carotid bruits. Pulmonary: Clear to auscultation bilaterally   NEUROLOGICAL EXAM:  MENTAL STATUS: Speech/cognition: Awake, alert, oriented to history taking and casual conversation CRANIAL NERVES: CN II: Visual fields are full to confrontation. Pupils are round equal and briskly reactive to light. CN III, IV, VI: extraocular movement are normal.  No ptosis. CN V: Facial sensation is intact to light touch CN VII: Face is symmetric with normal eye closure  CN VIII: Hearing is normal to causal conversation. CN IX, X: Phonation is normal. CN XI: Head turning and shoulder shrug are intact  MOTOR: There is no pronator drift of out-stretched arms.  Muscle bulk and tone are normal. Muscle strength is normal.  REFLEXES: Reflexes are 1 and symmetric at the biceps, triceps, knees, and absent at ankles. Plantar responses are flexor.  SENSORY: Length-dependent decreased light touch, pinprick, vibratory sensation to ankle level  COORDINATION: There is no trunk or limb dysmetria noted.  GAIT/STANCE: Push-up to get up from seated position, mildly antalgic, but steady  REVIEW OF SYSTEMS:  Full 14 system review of systems performed and notable only for as above All other review of systems were negative.   ALLERGIES: Allergies  Allergen Reactions   Ace Inhibitors     Other reaction(s): Cough   Fluoxetine Anxiety    "made me fall asleep" per pt "bad headaches and "makes  Me  Crazy" historical allergy noted in [employer] "made me fall asleep" per pt "bad headaches and "makes  Me  Crazy" Per New Patient Packet.     Metoclopramide     Other reaction(s): Other (See Comments), Other (See Comments), Unknown Tardive Dyskinesia  historical allergy noted in [employer] Tardive Dyskinesia  Per New Patient Packet.   Nalbuphine     Used Post Back surgery- Anesthesiologist Error. Patient had Narcotic Withdraw. Per New Patient Packet.    Other     Other reaction(s): Other (See Comments) Altered mental status in combo with narcotics at previous hospitalization - Full Withdrawal Symptoms Other reaction(s): Rash   Amoxicillin-Pot Clavulanate Nausea Only    Per New Patient Packet.   Doxazosin Rash    Other reaction(s): Other - See Comments, Rash UNKNOWN REACTION UNKNOWN REACTION    Duloxetine Nausea Only    Per New Patient Packet.    Penicillins Nausea Only    Per New Patient Packet.   Tamsulosin Itching and Anxiety    Restless, Flushing, Heavy Chest, Itching, Hyperactive mood and Anxiety. Unable to handle side effects. Per New Patient Packet.     Trazodone And Nefazodone Itching, Anxiety and Rash    Headache. "INCREASED MY ANXIETY AND  HEARTRATE" Flushing, tachycardia "INCREASED MY ANXIETY AND HEARTRATE" Per New Patient Packet.    Amlodipine     Shaking, unsure of reaction. Per New Patient Packet.     Cinoxacin     GI Intolerance, and Dizziness. Per New Patient Packet.    Ciprofloxacin     Other reaction(s): Unknown   Fludrocortisone Other (See Comments)    "Worsening headaches, GI issues, fatigue"   Nebivolol     Other reaction(s): Unknown   Olanzapine     Headache and unable to sleep for 3 nights. Per New Patient Packet.    Olmesartan     Other reaction(s): Unknown   Pregabalin     Confusion, Lack of concentration, dizziness, and likely drowsiness. Per New Patient Packet.     Prostaglandins     Other reaction(s): Other (See Comments) Intolerance   Thyroid Hormones     Other reaction(s): Other (See Comments) Thyroid (Nature Thyroid) contraindicated with some of your other medications.   Zolpidem     Nightmares, Ineffective after 2 days. Per New Patient Packet.    Duloxetine Hcl     Other reaction(s): Rash   Fluoxetine Hcl     Other reaction(s):  Rash   Nucynta [Tapentadol] Other (See Comments)    Vertigo    Phenytoin Anxiety    Hyperactivity, and Ineffective. Per New Patient Packet.     HOME MEDICATIONS: Current Outpatient Medications  Medication Sig Dispense Refill   AMBULATORY NON FORMULARY MEDICATION Apply 1 patch topically 3 (three) times a week. Medication Name: iron with vit C patch     AMBULATORY NON FORMULARY MEDICATION Take 2 capsules by mouth at bedtime. Medication Name: plas myc     amitriptyline (ELAVIL) 10 MG tablet Take 3 tablets (30 mg total) by mouth at bedtime. 90 tablet 11   anastrozole (ARIMIDEX) 1 MG tablet 1/2 tablet (0.5 mg) by mouth once weekly     Butalbital-APAP-Caffeine 50-300-40 MG CAPS Take 1 capsule by mouth every 6 (six) hours as needed. 90 capsule 0   cetirizine (ZYRTEC) 10 MG tablet Take 10 mg by mouth daily as needed for allergies.     Cholecalciferol 50 MCG (2000  UT) CAPS Take 1 tablet by mouth daily.     cholestyramine (QUESTRAN) 4 g packet TAKE 1 PACKET (4 G TOTAL) BY MOUTH 3 (THREE) TIMES DAILY.     clopidogrel (PLAVIX) 75 MG tablet TAKE 1 TABLET BY MOUTH ONCE DAILY. 90 tablet 2   Cyanocobalamin (VITAMIN B-12) 5000 MCG LOZG Take 1 lozenge by mouth daily.      DHEA 25 MG CAPS Take 25 mg by mouth daily.     diazepam (VALIUM) 5 MG tablet TAKE 1 TABLET IN THE MORNING AND 2 TABLETS AT BEDTIME 90 tablet 0   dicyclomine (BENTYL) 10 MG capsule TAKE 1 CAPSULE (10 MG TOTAL) BY MOUTH 4 TIMES A DAY BEFORE MEALS AND AT BEDTIME 90 capsule 1   Digestive Aids Mixture (DIGESTION GB PO) Take 1 capsule by mouth daily.     escitalopram (LEXAPRO) 20 MG tablet Take 1 tablet (20 mg total) by mouth daily. 90 tablet 1   ezetimibe-simvastatin (VYTORIN) 10-20 MG tablet Take 1 tablet by mouth daily. 30 tablet 11   hydrALAZINE (APRESOLINE) 25 MG tablet Take 1 tablet (25 mg total) by mouth 2 (two) times daily. In the morning and at dinner, may take extra tablet for BP greater than 160 180 tablet 3   Loperamide HCl (IMODIUM PO) Take by mouth as needed.      MAGNESIUM BISGLYCINATE PO Take by mouth daily.     melatonin 5 MG TABS Take 5 mg by mouth at bedtime.     Misc Natural Products (GLUTALOEMINE) POWD Take 1 Scoop by mouth daily.     NONFORMULARY OR COMPOUNDED ITEM Bi-Mix Papaverine 30mg , Phentolamine 1mg    Dosage: Inject 0.25cc-0.5cc as need for ED   Vial 1ml   Qty #5 Refills 6   [facility] [phone] Fax [phone] 5 each 6   Nutritional Supplements (NUTRITIONAL SUPPLEMENT PO) Take 1 capsule by mouth daily. [name]     POTASSIUM PO Take 1 tablet by mouth as directed. Takes 5x weekly.     PREVIDENT 5000 DRY MOUTH 1.1 % GEL dental gel SMARTSIG:To Teeth 3 Times Daily     Probiotic Product (PROBIOTIC DAILY PO) Take by mouth daily.     prochlorperazine (COMPAZINE) 10 MG tablet Take 1 tablet (10 mg total) by mouth 2 (two) times daily as needed  for nausea or vomiting. 180 tablet 0   propranolol ER (INDERAL LA) 60 MG 24 hr capsule Take 1 capsule (60 mg total) by mouth 2 (two) times daily. At [facility] and Bedtime  180 capsule 3   pyridostigmine (MESTINON) 60 MG tablet Take 0.5 tablets (30 mg total) by mouth 2 (two) times daily. 120 tablet 0   RABEprazole (ACIPHEX) 20 MG tablet Take 1 tablet (20 mg total) by mouth 2 (two) times daily. (Patient taking differently: Take 40 mg by mouth 2 (two) times daily.) 180 tablet 3   sucralfate (CARAFATE) 1 g tablet Take 1 tablet (1 g total) by mouth 4 (four) times daily. 360 tablet 3   testosterone cypionate (DEPOTESTOSTERONE CYPIONATE) 200 MG/ML injection Inject 80 mg into the muscle every 14 (fourteen) days.     valACYclovir (VALTREX) 1000 MG tablet Take 2 tabs p.o. and repeat in 12 hours as needed for cold sore 30 tablet 0   No current facility-administered medications for this visit.    PAST MEDICAL HISTORY: Past Medical History:  Diagnosis Date   Anemia    Aneurysm of ascending aorta (HCC)    a.) CT chest 08/18/2022: 4.1 cm; b.) CT chest 12/07/2022: 4.2 cm   Anxiety    a.) on BZO (diazepam) PRN   Aortic atherosclerosis (HCC)    Chronic back pain    Coronary artery disease    a.) s/p PCI with DES x 2 (pLCx and o-mLAD) 11/27/2021   Depression    Erectile dysfunction    a.) Bi-mix (papaverine + phentolamine) injections + exogenous testosterone injections   GERD (gastroesophageal reflux disease)    h/o Lyme disease    Headache    Hiatal hernia    History of bilateral cataract extraction 01/2021   History of gastritis    History of kidney stones    History of neuropathy    Hyperlipidemia LDL goal <70    a.) CAD with NSTEMI -->  intolerant of statins with statin myopathy and memory issues.;  Also intolerant of Repatha   Hypertension    Labile blood pressures but dizziness with blood pressures in the "normal range"; intolerant of most medications including ARB's, ACE inhibitor's, amlodipine  most beta-blockers other than propranolol.   Insomnia    a.) takes malatonin PRN   Left lower lobe pulmonary nodule    a.) chest CT 12/07/2022: nodules x 2 posteromedial LLL;  8 x 11 mm and 8 x 10 mm   Long term current use of antithrombotics/antiplatelets    a.) DAPT (ASA + clopidogrel)   Long term current use of aromatase inhibitor    a,) anastrozole --> estridiol suppression secondary to exogenous testosterone use   Lumbar radicular pain    Myocardial infarction (HCC)    a.) MI x 2 - 1990 * 1999 - PTCA   NSTEMI (non-ST elevated myocardial infarction) (HCC) 11/27/2021   a.) LHC 11/28/2021: sequential 75% o-pLAD, 70% OM2, 80% p-mLAD, 99% mLAD, 100% pLCx --> PCI placing a 2.5 x 26 mm Onyx Frontier DES to pLCx and a 2.5 x 24 mm Onyx Frontier DES to the o-m LAD (covering 3 lesions)   Overactive bladder    Pneumonia    PONV (postoperative nausea and vomiting)    Recurrent herpes labialis    a.) has suppressive valacyclovir to use PRN   Skin cancer, basal cell    Spinal cord stimulator status    01/08/21 - not currently using.   Squamous cell skin cancer    Substance abuse (HCC)     PAST SURGICAL HISTORY: Past Surgical History:  Procedure Laterality Date   BACK SURGERY     BASAL CELL CARCINOMA EXCISION     BIOPSY  07/27/2023  Procedure: BIOPSY;  Surgeon: Wyline Mood, MD;  [facility] END[facility]COPY;  Service: Gastroenterology;;   CATARACT EXTRACTION W/PHACO Left 01/14/2021   Procedure: CATARACT EXTRACTION PHACO AND INTRAOCULAR LENS PLACEMENT (IOC) LEFT VIVITY TORIC LENS 8.75 00:56.7;  Surgeon: Galen Manila, MD;  Location: [facility];  Service: Ophthalmology;  Laterality: Left;   CATARACT EXTRACTION W/PHACO Right 01/28/2021   Procedure: CATARACT EXTRACTION PHACO AND INTRAOCULAR LENS PLACEMENT (IOC) RIGHT VIVITY TORIC LENS;  Surgeon: Galen Manila, MD;  [facility];  Service: Ophthalmology;  Laterality: Right;  6.54 00:46.4   CHOLECYSTECTOMY  2017    COLONOSCOPY  09/15/2015   Per New Patient Packet   COLONOSCOPY WITH PROPOFOL N/A 10/03/2020   Procedure: COLONOSCOPY WITH PROPOFOL;  Surgeon: Wyline Mood, MD;  Location: [facility] END[facility]COPY;  Service: Gastroenterology;  Laterality: N/A;   CORONARY/GRAFT ACUTE MI REVASCULARIZATION N/A 11/27/2021   Procedure: Coronary/Graft Acute MI Revascularization;  Surgeon: Marykay Lex, MD;  Location[facility] CATH: (NSTEMI) - > 100% prox-mid LCx (Onyx Frontier DES 2.5 x 26 -> 2.7 mm, Ost  OM1 65%.  Ost-mid LAD 3 lesions 75%, 90%, 99% => DES PCI Onyx Frontier DES 2.5 x 34 -> 2.8 mm   ESOPHAGOGASTRODUODENOSCOPY (EGD) WITH PROPOFOL N/A 10/03/2020   Procedure: ESOPHAGOGASTRODUODENOSCOPY (EGD) WITH PROPOFOL;  Surgeon: Wyline Mood, MD;  Location: [facility] ENDOSCOPY;  Service: Gastroenterology;  Laterality: N/A;   ESOPHAGOGASTRODUODENOSCOPY (EGD) WITH PROPOFOL N/A 07/27/2023   Procedure: ESOPHAGOGASTRODUODENOSCOPY (EGD) WITH PROPOFOL;  Surgeon: Wyline Mood, MD;  Location: [facility] ENDOSCOPY;  Service: Gastroenterology;  Laterality: N/A;   FOOT SURGERY Bilateral 03/18/2020   FOOT SURGERY  08/032021   GALLBLADDER SURGERY  09/15/2015   Gallbladder Removal. Procedure done by Dr.Beverly. Per New Patient Packet   KIDNEY STONE SURGERY  09/14/1980   Too many to count. Per New Patient Packet 09/14/1980-09/15/1995   KIDNEY STONE SURGERY  09/15/1995   Too many to count. Per New Patient Packet   LEFT HEART CATH AND CORONARY ANGIOGRAPHY N/A 11/27/2021   Procedure: LEFT HEART CATH AND CORONARY ANGIOGRAPHY;  Surgeon: Marykay Lex, MD;  Location: [facility] CATH:  NSTEMI - 2 V CAD: 100% thrombotic prox-mid LCx (DES PCI), ost OM1 65%; Ost LAD 75% - prox LAD 90%, prox-mid 99% (DES PCI).  MIld diffuse RCA disease - calcicifed.  R-L collaterals filling LCx. EF ~45-50% with lateral HK. LVEDP 28 mmHg   LITHOTRIPSY  09/14/2014   Per New Patient Packet   LITHOTRIPSY  09/14/1996   No Stints Used. Per New Patient Packet   LITHOTRIPSY  09/14/1997   No  Stints used. Per New Patient Packet   PAIN PUMP IMPLANTATION     PAIN PUMP REMOVAL     SHOULDER SURGERY Right 1984   SIGMOIDOSCOPY  09/14/2017   Per New Patient Packet   SPINAL CORD STIMULATOR IMPLANT     TONSILLECTOMY     TRANSTHORACIC ECHOCARDIOGRAM  11/28/2021   (in setting of non-STEMI): EF 50 to 55%.  Suspect inferior and lateral hypokinesis.  Indeterminate diastolic parameters.  Normal RV size and function.  Normal RAP.  Normal aortic and mitral valves with only mild MR and AI.  Normal RVP and RAP.   UPPER GI ENDOSCOPY     VIDEO BRONCHOSCOPY WITH ENDOBRONCHIAL ULTRASOUND N/A 01/15/2023   Procedure: VIDEO BRONCHOSCOPY WITH ENDOBRONCHIAL ULTRASOUND;  Surgeon: Vida Rigger, MD;  Location: [facility] ORS;  Service: Thoracic;  Laterality: N/A;    FAMILY HISTORY: Family History  Problem Relation Age of Onset   Heart disease Father  Heart failure Father    Hypertension Father    Stroke Father    Stroke Mother    Dementia Mother    Kidney Stones Daughter    Anxiety disorder Daughter    OCD Daughter     SOCIAL HISTORY: Social History   Socioeconomic History   Marital status: Married    Spouse name: Therapist, art   Number of children: Not on file   Years of education: Not on file   Highest education level: Not on file  Occupational History   Not on file  Tobacco Use   Smoking status: Never    Passive exposure: Never   Smokeless tobacco: Never  Vaping Use   Vaping status: Never Used  Substance and Sexual Activity   Alcohol use: Yes    Comment: 1 Drink a Month, socially   Drug use: Not Currently   Sexual activity: Yes    Birth control/protection: None  Other Topics Concern   Not on file  Social History Narrative   Tobacco use, amount per day now: None   Past tobacco use, amount per day: None   How many years did you use tobacco: 0   Alcohol use (drinks per week): 0-1 Month   Diet:   Do you drink/eat things with caffeine: Occasionally ( Hot Chocolate and maybe 1/4 of 16oz  Pepsi 2-3 times a week.   Marital status: Married                                  What year were you married? 1970   Do you live in a house, apartment, assisted living, condo, trailer, etc.? House   Is it one or more stories? One   How many persons live in your home? 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 Yes                                    Type and how often? [place], Recumbent Bike, 3 times a week. Try to get a 1.2 mile walk at least 3 times a week or more.     Do you have a living will? Yes   Do you have a DNR form?  No                                 If not, do you want to discuss one?   Do you have signed POA/HPOA forms? Yes                       If so, please bring to you appointment    Do you have any difficulty bathing or dressing yourself? No    Do you have difficulty preparing food or eating? No   Do you have difficulty managing your medications? No   Do you have any difficulty managing your finances? No   Do you have any difficulty affording your medications? No         Social Determinants of Health   Financial Resource Strain: Low Risk  (07/29/2023)   Received from [facility]  System   Overall Financial Resource Strain (CARDIA)    Difficulty of Paying Living Expenses: Not hard at all  Food Insecurity: No Food Insecurity (07/29/2023)   [facility] from [facility]   Hunger Vital Sign    Worried About Running Out of Food in the Last Year: Never true    Ran Out of Food in the Last Year: Never true  Transportation Needs: No Transportation Needs (07/29/2023)   Received from [facility] - Transportation    In the past 12 months, has lack of transportation kept you from medical appointments or from getting medications?: No    Lack of Transportation  (Non-Medical): No  Physical Activity: Insufficiently Active (12/03/2022)   Exercise Vital Sign    Days of Exercise per Week: 3 days    Minutes of Exercise per Session: 30 min  Stress: No Stress Concern Present (12/03/2022)   [employer] of Occupational Health - Occupational Stress Questionnaire    Feeling of Stress : Not at all  Social Connections: Moderately Integrated (12/03/2022)   Social Connection and Isolation Panel [NHANES]    Frequency of Communication with Friends and Family: Twice a week    Frequency of Social Gatherings with Friends and Family: More than three times a week    Attends Religious Services: More than 4 times per year    Active Member of [employer] or Organizations: No    Attends Banker Meetings: Never    Marital Status: Married  Catering manager Violence: Not At Risk (03/25/2023)   Humiliation, Afraid, Rape, and Kick questionnaire    Fear of Current or Ex-Partner: No    Emotionally Abused: No    Physically Abused: No    Sexually Abused: No      Levert Feinstein, M.D. Ph.D.  [facility] [address] [place], [place] [address] Ph: [phone] Fax: [phone]  CC:  Lorre Munroe, NP [address] [place],  [place] [address]  Lorre Munroe, NP    Total time spent reviewing the chart, obtaining history, examined patient, ordering tests, documentation, consultations and family, care coordination was 75 minutes

## 2023-08-19 NOTE — Procedures (Signed)
Full Name: Grant Young Gender: Male MRN #: [id number] Date of Birth: 1946-06-28    Visit Date: 08/18/2023 13:13 Age: 77 Years Examining Physician: Dr. Levert Feinstein Referring Physician: Dr. Levert Feinstein Height: 5 feet 9 inch History: 77 year old male with with history of bilateral foot paresthesia  Summary of the test: Nerve conduction study: Bilateral sural, superficial peroneal sensory responses were absent.  Right ulnar sensory response showed mildly prolonged peak latency with normal range snap amplitude.  Right median sensory response showed moderately prolonged peak latency with moderately decreased snap amplitude.  Right radial sensory response showed mildly prolonged peak latency with mildly decreased snap amplitude.  Right tibial, bilateral peroneal to EDB motor responses were absent.  Left tibial motor responses showed moderately decreased CMAP amplitude.  Right ulnar, median motor responses were normal.  Electromyography: Selected needle examinations were performed at bilateral lower extremity muscles, lumbosacral paraspinal muscles.  There is evidence of chronic mild neuropathic changes involving bilateral L4-5 myotomes.  There was no spontaneous activity involving bilateral lumbosacral paraspinal muscles.   Conclusion: This is an abnormal study.  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    Levert Feinstein. M.D. Ph.D.   [facility] [address] [place], [place] [address] Tel: [phone] Fax: [phone]  Verbal informed consent was obtained from the patient, patient was informed of potential risk of procedure, including bruising, bleeding, hematoma formation, infection, muscle weakness, muscle pain, numbness, among others.        MNC    Nerve / Sites Muscle  Latency Ref. Amplitude Ref. Rel Amp Segments Distance Velocity Ref. Area    ms ms mV mV %  cm m/s m/s mVms  R Median - APB     Wrist APB 3.8 <=4.4 7.2 >=4.0 100 Wrist - APB 7   39.0     Upper arm APB 9.0  6.9  95.6 Upper arm - Wrist 26 50 >=49 37.4  R Ulnar - ADM     Wrist ADM 3.3 <=3.3 6.2 >=6.0 100 Wrist - ADM 7   23.1     B.Elbow ADM 5.4  5.3  86 B.Elbow - Wrist 11 52 >=49 20.7     A.Elbow ADM 9.3  5.8  109 A.Elbow - B.Elbow 19 49 >=49 23.9  R Peroneal - EDB     Ankle EDB NR <=6.5 NR >=2.0 NR Ankle - EDB 9   NR         Pop fossa - Ankle      L Peroneal - EDB     Ankle EDB NR <=6.5 NR >=2.0 NR Ankle - EDB 9   NR         Pop fossa - Ankle      R Tibial - AH     Ankle AH NR <=5.8 NR >=4.0 NR Ankle - AH 9   NR  L Tibial - AH     Ankle AH 5.5 <=5.8 2.5 >=4.0 100 Ankle - AH 9   4.7     Pop fossa AH 15.1  1.3  52.7 Pop fossa - Ankle 36 38 >=41 1.8                 SNC    Nerve / Sites Rec. Site Peak Lat Ref.  Amp Ref. Segments Distance    ms ms V V  cm  R Radial - Anatomical snuff box (Forearm)     Forearm Wrist 3.1 <=2.9 9 >=15 Forearm - Wrist 10  R Sural - Ankle (Calf)     Calf Ankle NR <=4.4 NR >=6 Calf - Ankle 14  L Sural - Ankle (Calf)     Calf Ankle NR <=4.4 NR >=6 Calf - Ankle 14  R Superficial peroneal - Ankle     Lat leg Ankle NR <=4.4 NR >=6 Lat leg - Ankle 14  L Superficial peroneal - Ankle     Lat leg Ankle NR <=4.4 NR >=6 Lat leg - Ankle 14  R Median - Orthodromic (Dig II, Mid palm)     Dig II Wrist 4.6 <=3.4 4 >=10 Dig II - Wrist 13  R Ulnar - Orthodromic, (Dig V, Mid palm)     Dig V Wrist 3.6 <=3.1 5 >=5 Dig V - Wrist 56                   F  Wave    Nerve F Lat Ref.   ms ms  L Tibial - AH 48.3 <=56.0  R Ulnar - ADM 29.0 <=32.0         EMG Summary Table    Spontaneous MUAP Recruitment  Muscle IA Fib PSW Fasc Other Amp Dur. Poly Pattern  L. Tibialis anterior Increased None None None _______ Normal Normal Normal Reduced  L. Tibialis posterior Normal None  None None _______ Normal Normal Normal Reduced  L. Peroneus longus Normal None None None _______ Normal Normal Normal Reduced  L. Gastrocnemius (Medial head) Normal None None None _______ Normal Normal Normal Reduced  L. Vastus lateralis Normal None None None _______ Normal Normal Normal Reduced  R. Tibialis anterior Normal None None None _______ Normal Normal Normal Reduced  R. Tibialis posterior Normal None None None _______ Normal Normal Normal Reduced  R. Peroneus longus Normal None None None _______ Normal Normal Normal Reduced  R. Gastrocnemius (Medial head) Normal None None None _______ Normal Normal Normal Reduced  R. Vastus lateralis Normal None None None _______ Normal Normal Normal Reduced  R. Lumbar paraspinals (low) Normal None None None _______ Normal Normal Normal Normal  R. Lumbar paraspinals (mid) Normal None None None _______ Normal Normal Normal Normal  L. Lumbar paraspinals (low) Normal None None None _______ Normal Normal Normal Normal  L. Lumbar paraspinals (mid) Normal None None None _______ Normal Normal Normal Normal

## 2023-08-23 ENCOUNTER — Telehealth: Payer: Self-pay

## 2023-08-23 ENCOUNTER — Other Ambulatory Visit: Payer: Self-pay

## 2023-08-23 MED ORDER — RABEPRAZOLE SODIUM 20 MG PO TBEC: 40.0000 mg | DELAYED_RELEASE_TABLET | Freq: Two times a day (BID) | ORAL | 3 refills | Status: DC

## 2023-08-23 NOTE — Telephone Encounter (Signed)
Message sent via MyChart today.

## 2023-08-23 NOTE — Telephone Encounter (Signed)
Dr. Tobi Bastos, patient wanted to know if he needs a repeat EGD soon. I did not see any comments on his last report. Please advise.

## 2023-08-23 NOTE — Progress Notes (Signed)
Chief Complaint  Patient presents with   Procedure    Rm EMG/NCV 4.      ASSESSMENT AND PLAN  Grant Young is a 77 y.o. 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   MEDICAL HISTORY:  Grant Young, is a 77 year old male seen in request by his primary care nurse practitioner Lorre Munroe, for evaluation of constellation of complaints initial evaluation July 06, 2023  History is obtained from the patient and review of electronic medical records. I personally reviewed pertinent available imaging films in PACS.   PMHx of  CAD Depression, Anxiety, chronic insomnia, Valium 5/10 Chronic migraine, Fioricet 1-2 daily HLD HTN Lumbar decompression surgery Chronic pain, has spinal stimulator, not function OSA- mild, study in 2023, no need for CPAP  Patient complains of headaches since 2019, starting at occipital region spreading forward to settle at the frontal region, almost daily 5 out of 10 headache, most noticeable in the morning, either when he first wake up, or after breakfast, photophobia, but denies significant phonophobia, throbbing headache, over the years, he has been taking Fioricet almost on a daily basis, described worsening headache without his Fioricet  He was  seen by Outside neurologist Dr. 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 No evidence of acute fracture to the cervical spine.  2. Mild C5-C6 grade 1 anterolisthesis.  3. Cervical spondylosis as described and greatest at C6-C7.  UPDATE Dec  4th 2024: EMG nerve conduction study today showed moderate axonal sensorimotor polyneuropathy, evidence of mild bilateral chronic lumbosacral radiculopathy  Laboratory evaluation for potential etiology for peripheral neuropathy was nonrevealing, including normal A1c, protein electrophoresis, CBC, ferritin and vitamin D ANA ESR CPK TSH C-reactive protein folic acid to RPR, B12,  He did have a history of lumbar decompression surgery in the past, x-ray of lumbar spine in 2024 showed posterior instrumented PLIF L5-S1,  He was given a short trial of lamotrigine, could not tolerate it, higher dose of amitriptyline  PHYSICAL EXAM:   Vitals:   08/18/23 1318  BP: (!) 155/80  Pulse: 66  Weight: 138 lb (62.6 kg)  Height: 5\' 9"  (1.753 m)   Body mass index is 20.38 kg/m.  PHYSICAL EXAMNIATION:  Gen: NAD, conversant, well nourised, well groomed  Cardiovascular: Regular rate rhythm, no peripheral edema, warm, nontender. Eyes: Conjunctivae clear without exudates or hemorrhage Neck: Supple, no  carotid bruits. Pulmonary: Clear to auscultation bilaterally   NEUROLOGICAL EXAM:  MENTAL STATUS: Speech/cognition: Depressed looking elderly male, awake, alert, oriented to history taking and casual conversation CRANIAL NERVES: CN II: Visual fields are full to confrontation. Pupils are round equal and briskly reactive to light. CN III, IV, VI: extraocular movement are normal. No ptosis. CN V: Facial sensation is intact to light touch CN VII: Face is symmetric with normal eye closure  CN VIII: Hearing is normal to causal conversation. CN IX, X: Phonation is normal. CN XI: Head turning and shoulder shrug are intact  MOTOR: There is no pronator drift of out-stretched arms. Muscle bulk and tone are normal. Muscle strength is normal.  REFLEXES: Reflexes are 1 and symmetric at the biceps, triceps, knees, and absent at ankles. Plantar responses are flexor.  SENSORY: Length-dependent decreased light touch, pinprick, vibratory sensation to ankle level  COORDINATION: There is no trunk or limb dysmetria noted.  GAIT/STANCE: Push-up to get up from seated position, mildly antalgic, but steady  REVIEW OF SYSTEMS:  Full 14 system review of systems performed and notable only for as above All other review of systems were negative.   ALLERGIES: Allergies  Allergen Reactions   Ace Inhibitors     Other reaction(s): Cough   Fluoxetine Anxiety    "made me fall asleep" per pt "bad headaches and "makes  Me  Crazy" historical allergy noted in [employer] "made me fall asleep" per pt "bad headaches and "makes  Me  Crazy" Per New Patient Packet.     Metoclopramide     Other reaction(s): Other (See Comments), Other (See Comments), Unknown Tardive Dyskinesia  historical allergy noted in [employer] Tardive Dyskinesia  Per New Patient Packet.   Nalbuphine     Used Post Back surgery- Anesthesiologist Error. Patient had Narcotic Withdraw. Per New Patient Packet.    Other     Other reaction(s):  Other (See Comments) Altered mental status in combo with narcotics at previous hospitalization - Full Withdrawal Symptoms Other reaction(s): Rash   Amoxicillin-Pot Clavulanate Nausea Only    Per New Patient Packet.   Doxazosin Rash    Other reaction(s): Other - See Comments, Rash UNKNOWN REACTION UNKNOWN REACTION    Duloxetine Nausea Only    Per New Patient Packet.    Penicillins Nausea Only    Per New Patient Packet.   Tamsulosin Itching and Anxiety    Restless, Flushing, Heavy Chest, Itching, Hyperactive mood and Anxiety. Unable to handle side effects. Per New Patient Packet.     Trazodone And Nefazodone Itching, Anxiety and Rash    Headache. "INCREASED MY ANXIETY AND HEARTRATE" Flushing, tachycardia "INCREASED MY ANXIETY AND HEARTRATE" Per New Patient Packet.    Amlodipine     Shaking, unsure of reaction. Per New Patient Packet.     Cinoxacin     GI Intolerance, and Dizziness. Per New Patient Packet.    Ciprofloxacin     Other reaction(s): Unknown   Fludrocortisone Other (See Comments)    "Worsening headaches, GI issues, fatigue"   Nebivolol     Other reaction(s): Unknown   Olanzapine     Headache and unable to sleep for 3 nights. Per New Patient Packet.    Olmesartan     Other reaction(s): Unknown   Pregabalin     Confusion, Lack of concentration, dizziness, and likely drowsiness. Per New Patient Packet.  Prostaglandins     Other reaction(s): Other (See Comments) Intolerance   Thyroid Hormones     Other reaction(s): Other (See Comments) Thyroid (Nature Thyroid) contraindicated with some of your other medications.   Zolpidem     Nightmares, Ineffective after 2 days. Per New Patient Packet.    Duloxetine Hcl     Other reaction(s): Rash   Fluoxetine Hcl     Other reaction(s): Rash   Nucynta [Tapentadol] Other (See Comments)    Vertigo    Phenytoin Anxiety    Hyperactivity, and Ineffective. Per New Patient Packet.     HOME MEDICATIONS: Current  Outpatient Medications  Medication Sig Dispense Refill   AMBULATORY NON FORMULARY MEDICATION Apply 1 patch topically 3 (three) times a week. Medication Name: iron with vit C patch     AMBULATORY NON FORMULARY MEDICATION Take 2 capsules by mouth at bedtime. Medication Name: plas myc     amitriptyline (ELAVIL) 10 MG tablet Take 3 tablets (30 mg total) by mouth at bedtime. 90 tablet 11   anastrozole (ARIMIDEX) 1 MG tablet 1/2 tablet (0.5 mg) by mouth once weekly     Butalbital-APAP-Caffeine 50-300-40 MG CAPS Take 1 capsule by mouth every 6 (six) hours as needed. 90 capsule 0   cetirizine (ZYRTEC) 10 MG tablet Take 10 mg by mouth daily as needed for allergies.     Cholecalciferol 50 MCG (2000 UT) CAPS Take 1 tablet by mouth daily.     cholestyramine (QUESTRAN) 4 g packet TAKE 1 PACKET (4 G TOTAL) BY MOUTH 3 (THREE) TIMES DAILY.     clopidogrel (PLAVIX) 75 MG tablet TAKE 1 TABLET BY MOUTH ONCE DAILY. 90 tablet 2   Cyanocobalamin (VITAMIN B-12) 5000 MCG LOZG Take 1 lozenge by mouth daily.      DHEA 25 MG CAPS Take 25 mg by mouth daily.     diazepam (VALIUM) 5 MG tablet TAKE 1 TABLET IN THE MORNING AND 2 TABLETS AT BEDTIME 90 tablet 0   dicyclomine (BENTYL) 10 MG capsule TAKE 1 CAPSULE (10 MG TOTAL) BY MOUTH 4 TIMES A DAY BEFORE MEALS AND AT BEDTIME 90 capsule 1   Digestive Aids Mixture (DIGESTION GB PO) Take 1 capsule by mouth daily.     escitalopram (LEXAPRO) 20 MG tablet Take 1 tablet (20 mg total) by mouth daily. 90 tablet 1   ezetimibe-simvastatin (VYTORIN) 10-20 MG tablet Take 1 tablet by mouth daily. 30 tablet 11   hydrALAZINE (APRESOLINE) 25 MG tablet Take 1 tablet (25 mg total) by mouth 2 (two) times daily. In the morning and at dinner, may take extra tablet for BP greater than 160 180 tablet 3   Loperamide HCl (IMODIUM PO) Take by mouth as needed.      MAGNESIUM BISGLYCINATE PO Take by mouth daily.     melatonin 5 MG TABS Take 5 mg by mouth at bedtime.     Misc Natural Products  (GLUTALOEMINE) POWD Take 1 Scoop by mouth daily.     NONFORMULARY OR COMPOUNDED ITEM Bi-Mix Papaverine 30mg , Phentolamine 1mg    Dosage: Inject 0.25cc-0.5cc as need for ED   Vial 1ml   Qty #5 Refills 6   [facility] [phone] Fax [phone] 5 each 6   Nutritional Supplements (NUTRITIONAL SUPPLEMENT PO) Take 1 capsule by mouth daily. [name]     POTASSIUM PO Take 1 tablet by mouth as directed. Takes 5x weekly.     PREVIDENT 5000 DRY MOUTH 1.1 % GEL dental gel SMARTSIG:To Teeth 3  Times Daily     Probiotic Product (PROBIOTIC DAILY PO) Take by mouth daily.     prochlorperazine (COMPAZINE) 10 MG tablet Take 1 tablet (10 mg total) by mouth 2 (two) times daily as needed for nausea or vomiting. 180 tablet 0   propranolol (INDERAL) 10 MG tablet Take 1 tablet (10 mg total) by mouth 2 (two) times daily as needed for up to 720 doses. 60 tablet 11   propranolol ER (INDERAL LA) 60 MG 24 hr capsule Take 1 capsule (60 mg total) by mouth 2 (two) times daily. At [facility] and Bedtime 180 capsule 3   pyridostigmine (MESTINON) 60 MG tablet Take 0.5 tablets (30 mg total) by mouth 2 (two) times daily. 120 tablet 0   RABEprazole (ACIPHEX) 20 MG tablet Take 1 tablet (20 mg total) by mouth 2 (two) times daily. (Patient taking differently: Take 40 mg by mouth 2 (two) times daily.) 180 tablet 3   sucralfate (CARAFATE) 1 g tablet Take 1 tablet (1 g total) by mouth 4 (four) times daily. 360 tablet 3   testosterone cypionate (DEPOTESTOSTERONE CYPIONATE) 200 MG/ML injection Inject 80 mg into the muscle every 14 (fourteen) days.     valACYclovir (VALTREX) 1000 MG tablet Take 2 tabs p.o. and repeat in 12 hours as needed for cold sore 30 tablet 0   No current facility-administered medications for this visit.    PAST MEDICAL HISTORY: Past Medical History:  Diagnosis Date   Anemia    Aneurysm of ascending aorta (HCC)    a.) CT chest 08/18/2022: 4.1 cm; b.) CT chest 12/07/2022: 4.2 cm    Anxiety    a.) on BZO (diazepam) PRN   Aortic atherosclerosis (HCC)    Chronic back pain    Coronary artery disease    a.) s/p PCI with DES x 2 (pLCx and o-mLAD) 11/27/2021   Depression    Erectile dysfunction    a.) Bi-mix (papaverine + phentolamine) injections + exogenous testosterone injections   GERD (gastroesophageal reflux disease)    h/o Lyme disease    Headache    Hiatal hernia    History of bilateral cataract extraction 01/2021   History of gastritis    History of kidney stones    History of neuropathy    Hyperlipidemia LDL goal <70    a.) CAD with NSTEMI -->  intolerant of statins with statin myopathy and memory issues.;  Also intolerant of Repatha   Hypertension    Labile blood pressures but dizziness with blood pressures in the "normal range"; intolerant of most medications including ARB's, ACE inhibitor's, amlodipine most beta-blockers other than propranolol.   Insomnia    a.) takes malatonin PRN   Left lower lobe pulmonary nodule    a.) chest CT 12/07/2022: nodules x 2 posteromedial LLL;  8 x 11 mm and 8 x 10 mm   Long term current use of antithrombotics/antiplatelets    a.) DAPT (ASA + clopidogrel)   Long term current use of aromatase inhibitor    a,) anastrozole --> estridiol suppression secondary to exogenous testosterone use   Lumbar radicular pain    Myocardial infarction (HCC)    a.) MI x 2 - 1990 * 1999 - PTCA   NSTEMI (non-ST elevated myocardial infarction) (HCC) 11/27/2021   a.) LHC 11/28/2021: sequential 75% o-pLAD, 70% OM2, 80% p-mLAD, 99% mLAD, 100% pLCx --> PCI placing a 2.5 x 26 mm Onyx Frontier DES to pLCx and a 2.5 x 24 mm Onyx Frontier DES to the o-m LAD (  covering 3 lesions)   Overactive bladder    Pneumonia    PONV (postoperative nausea and vomiting)    Recurrent herpes labialis    a.) has suppressive valacyclovir to use PRN   Skin cancer, basal cell    Spinal cord stimulator status    01/08/21 - not currently using.   Squamous cell skin cancer     Substance abuse (HCC)     PAST SURGICAL HISTORY: Past Surgical History:  Procedure Laterality Date   BACK SURGERY     BASAL CELL CARCINOMA EXCISION     BIOPSY  07/27/2023   Procedure: BIOPSY;  Surgeon: Wyline Mood, MD;  Location: [facility] ENDOSCOPY;  Service: Gastroenterology;;   CATARACT EXTRACTION W/PHACO Left 01/14/2021   Procedure: CATARACT EXTRACTION PHACO AND INTRAOCULAR LENS PLACEMENT (IOC) LEFT VIVITY TORIC LENS 8.75 00:56.7;  Surgeon: Galen Manila, MD;  Location: [facility];  Service: Ophthalmology;  Laterality: Left;   CATARACT EXTRACTION W/PHACO Right 01/28/2021   Procedure: CATARACT EXTRACTION PHACO AND INTRAOCULAR LENS PLACEMENT (IOC) RIGHT VIVITY TORIC LENS;  Surgeon: Galen Manila, MD;  [facility];  Service: Ophthalmology;  Laterality: Right;  6.54 00:46.4   CHOLECYSTECTOMY  2017   COLONOSCOPY  09/15/2015   Per New Patient Packet   COLONOSCOPY WITH PROPOFOL N/A 10/03/2020   Procedure: COLONOSCOPY WITH PROPOFOL;  Surgeon: Wyline Mood, MD;  Location: [facility] ENDOSCOPY;  Service: Gastroenterology;  Laterality: N/A;   CORONARY/GRAFT ACUTE MI REVASCULARIZATION N/A 11/27/2021   Procedure: Coronary/Graft Acute MI Revascularization;  Surgeon: Marykay Lex, MD;  Location: [facility] CATH: (NSTEMI) - > 100% prox-mid LCx (Onyx Frontier DES 2.5 x 26 -> 2.7 mm, Ost  OM1 65%.  Ost-mid LAD 3 lesions 75%, 90%, 99% => DES PCI Onyx Frontier DES 2.5 x 34 -> 2.8 mm   ESOPHAGOGASTRODUODENOSCOPY (EGD) WITH PROPOFOL N/A 10/03/2020   Procedure: ESOPHAGOGASTRODUODENOSCOPY (EGD) WITH PROPOFOL;  Surgeon: Wyline Mood, MD;  Location: [facility] ENDOSCOPY;  Service: Gastroenterology;  Laterality: N/A;   ESOPHAGOGASTRODUODENOSCOPY (EGD) WITH PROPOFOL N/A 07/27/2023   Procedure: ESOPHAGOGASTRODUODENOSCOPY (EGD) WITH PROPOFOL;  Surgeon: Wyline Mood, MD;  Location: [facility] ENDOSCOPY;  Service: Gastroenterology;  Laterality: N/A;   FOOT SURGERY Bilateral 03/18/2020   FOOT SURGERY   08/032021   GALLBLADDER SURGERY  09/15/2015   Gallbladder Removal. Procedure done by Dr.Beverly. Per New Patient Packet   KIDNEY STONE SURGERY  09/14/1980   Too many to count. Per New Patient Packet 09/14/1980-09/15/1995   KIDNEY STONE SURGERY  09/15/1995   Too many to count. Per New Patient Packet   LEFT HEART CATH AND CORONARY ANGIOGRAPHY N/A 11/27/2021   Procedure: LEFT HEART CATH AND CORONARY ANGIOGRAPHY;  Surgeon: Marykay Lex, MD;  Location: [facility] CATH:  NSTEMI - 2 V CAD: 100% thrombotic prox-mid LCx (DES PCI), ost OM1 65%; Ost LAD 75% - prox LAD 90%, prox-mid 99% (DES PCI).  MIld diffuse RCA disease - calcicifed.  R-L collaterals filling LCx. EF ~45-50% with lateral HK. LVEDP 28 mmHg   LITHOTRIPSY  09/14/2014   Per New Patient Packet   LITHOTRIPSY  09/14/1996   No Stints Used. Per New Patient Packet   LITHOTRIPSY  09/14/1997   No Stints used. Per New Patient Packet   PAIN PUMP IMPLANTATION     PAIN PUMP REMOVAL     SHOULDER SURGERY Right 1984   SIGMOIDOSCOPY  09/14/2017   Per New Patient Packet   SPINAL CORD STIMULATOR IMPLANT     TONSILLECTOMY     TRANSTHORACIC ECHOCARDIOGRAM  11/28/2021   (in  setting of non-STEMI): EF 50 to 55%.  Suspect inferior and lateral hypokinesis.  Indeterminate diastolic parameters.  Normal RV size and function.  Normal RAP.  Normal aortic and mitral valves with only mild MR and AI.  Normal RVP and RAP.   UPPER GI ENDOSCOPY     VIDEO BRONCHOSCOPY WITH ENDOBRONCHIAL ULTRASOUND N/A 01/15/2023   Procedure: VIDEO BRONCHOSCOPY WITH ENDOBRONCHIAL ULTRASOUND;  Surgeon: Vida Rigger, MD;  Location: [facility] ORS;  Service: Thoracic;  Laterality: N/A;    FAMILY HISTORY: Family History  Problem Relation Age of Onset   Heart disease Father    Heart failure Father    Hypertension Father    Stroke Father    Stroke Mother    Dementia Mother    Kidney Stones Daughter    Anxiety disorder Daughter    OCD Daughter     SOCIAL HISTORY: Social History    Socioeconomic History   Marital status: Married    Spouse name: Therapist, art   Number of children: Not on file   Years of education: Not on file   Highest education level: Not on file  Occupational History   Not on file  Tobacco Use   Smoking status: Never    Passive exposure: Never   Smokeless tobacco: Never  Vaping Use   Vaping status: Never Used  Substance and Sexual Activity   Alcohol use: Yes    Comment: 1 Drink a Month, socially   Drug use: Not Currently   Sexual activity: Yes    Birth control/protection: None  Other Topics Concern   Not on file  Social History Narrative   Tobacco use, amount per day now: None   Past tobacco use, amount per day: None   How many years did you use tobacco: 0   Alcohol use (drinks per week): 0-1 Month   Diet:   Do you drink/eat things with caffeine: Occasionally ( Hot Chocolate and maybe 1/4 of 16oz Pepsi 2-3 times a week.   Marital status: Married                                  What year were you married? 1970   Do you live in a house, apartment, assisted living, condo, trailer, etc.? House   Is it one or more stories? One   How many persons live in your home? 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 Yes                                    Type and how often? [place], Recumbent Bike, 3 times a week. Try to get a 1.2 mile walk at least 3 times a week or more.     Do you have a living will? Yes   Do you have a DNR form?  No                                 If not, do you want to discuss one?   Do you have signed POA/HPOA forms? Yes  If so, please bring to you appointment    Do you have any difficulty bathing or dressing yourself? No    Do you have difficulty preparing food or eating? No   Do you have difficulty managing your medications? No   Do you have  any difficulty managing your finances? No   Do you have any difficulty affording your medications? No         Social Determinants of Health   Financial Resource Strain: Low Risk  (07/29/2023)   Received from [facility]   Overall Financial Resource Strain (CARDIA)    Difficulty of Paying Living Expenses: Not hard at all  Food Insecurity: No Food Insecurity (07/29/2023)   [facility] from [facility]   Hunger Vital Sign    Worried About Running Out of Food in the Last Year: Never true    Ran Out of Food in the Last Year: Never true  Transportation Needs: No Transportation Needs (07/29/2023)   Received from [facility] - Transportation    In the past 12 months, has lack of transportation kept you from medical appointments or from getting medications?: No    Lack of Transportation (Non-Medical): No  Physical Activity: Insufficiently Active (12/03/2022)   Exercise Vital Sign    Days of Exercise per Week: 3 days    Minutes of Exercise per Session: 30 min  Stress: No Stress Concern Present (12/03/2022)   [employer] of Occupational Health - Occupational Stress Questionnaire    Feeling of Stress : Not at all  Social Connections: Moderately Integrated (12/03/2022)   Social Connection and Isolation Panel [NHANES]    Frequency of Communication with Friends and Family: Twice a week    Frequency of Social Gatherings with Friends and Family: More than three times a week    Attends Religious Services: More than 4 times per year    Active Member of [employer] or Organizations: No    Attends Banker Meetings: Never    Marital Status: Married  Catering manager Violence: Not At Risk (03/25/2023)   Humiliation, Afraid, Rape, and Kick questionnaire    Fear of Current or Ex-Partner: No    Emotionally Abused: No    Physically Abused: No    Sexually Abused: No      Levert Feinstein, M.D. Ph.D.  [facility] [address] [place], [place] [address] Ph: [phone] Fax: [phone]  CC:  Lorre Munroe, NP [address] [place],  [place] [address]  Lorre Munroe, NP    Total time spent reviewing the chart, obtaining history, examined patient, ordering tests, documentation, consultations and family, care coordination was 75 minutes

## 2023-08-25 ENCOUNTER — Other Ambulatory Visit: Payer: Self-pay | Admitting: Internal Medicine

## 2023-08-25 NOTE — Telephone Encounter (Signed)
Prescribed by Dr Wyline Mood, not at this  office.  Requested Prescriptions  Refused Prescriptions Disp Refills   RABEprazole (ACIPHEX) 20 MG tablet [Pharmacy Med Name: RABEPRAZOLE SOD DR 20 MG TAB] 180 tablet 3    Sig: TAKE 1 TABLET BY MOUTH TWICE A DAY     Gastroenterology: Proton Pump Inhibitors Passed - 08/25/2023  1:27 AM      Passed - Valid encounter within last 12 months    Recent Outpatient Visits           4 months ago Acute encephalopathy   [facility] [place], Salvadore Oxford, NP   6 months ago Acute non-recurrent pansinusitis   [place] [facility] [place], [place] W, NP   9 months ago Coronary artery disease involving native coronary artery of native heart without angina pectoris   [place] [facility] [place], Salvadore Oxford, NP   10 months ago Essential hypertension   [place] [facility] [place], Gentry Fitz A, RPH-CPP   11 months ago Generalized abdominal pain   [place] [facility] [place], Salvadore Oxford, NP       Future Appointments             In 1 month Wyline Mood, MD [facility] [place] [facility] [place]   In 2 months [name], Salvadore Oxford, NP [facility]   In 9 months Richardo Hanks, Laurette Schimke, MD [facility] [place]

## 2023-08-26 ENCOUNTER — Ambulatory Visit: Payer: Medicare Other | Admitting: Physician Assistant

## 2023-08-31 ENCOUNTER — Encounter: Payer: Self-pay | Admitting: Internal Medicine

## 2023-09-01 ENCOUNTER — Other Ambulatory Visit: Payer: Self-pay | Admitting: Internal Medicine

## 2023-09-01 NOTE — Telephone Encounter (Signed)
Requested medication (s) are due for refill today - yes  Requested medication (s) are on the active medication list -yes  Future visit scheduled -yes  Last refill: 07/30/23 #90  Notes to clinic: non delegated Rx  Requested Prescriptions  Pending Prescriptions Disp Refills   diazepam (VALIUM) 5 MG tablet [Pharmacy Med Name: DIAZEPAM 5MG  TABLETS] 90 tablet     Sig: TAKE 1 TABLET BY MOUTH EVERY MORNING AND 2 TABLETS EVERY NIGHT AT BEDTIME     Not Delegated - Psychiatry: Anxiolytics/Hypnotics 2 Failed - 09/01/2023  2:57 PM      Failed - This refill cannot be delegated      Passed - Urine Drug Screen completed in last 360 days      Passed - Patient is not pregnant      Passed - Valid encounter within last 6 months    Recent Outpatient Visits           5 months ago Acute encephalopathy   [place] [facility] [place], Salvadore Oxford, NP   6 months ago Acute non-recurrent pansinusitis   [place] [facility] [place], [place] W, NP   9 months ago Coronary artery disease involving native coronary artery of native heart without angina pectoris   [place] [facility] [place], Salvadore Oxford, NP   10 months ago Essential hypertension   [place] [facility] [place], Gentry Fitz A, RPH-CPP   11 months ago Generalized abdominal pain   [place] [facility] [place], Salvadore Oxford, NP       Future Appointments             In 3 weeks Wyline Mood, MD [facility] [place] [facility] [place]   In 1 month [name], Salvadore Oxford, NP [place] [facility], [facility]   In 8 months Richardo Hanks, Laurette Schimke, MD [facility] [place]               Requested Prescriptions  Pending Prescriptions Disp Refills   diazepam (VALIUM) 5 MG tablet [Pharmacy Med Name: DIAZEPAM 5MG  TABLETS] 90 tablet     Sig: TAKE 1 TABLET BY MOUTH EVERY MORNING AND 2 TABLETS EVERY NIGHT AT BEDTIME     Not Delegated - Psychiatry:  Anxiolytics/Hypnotics 2 Failed - 09/01/2023  2:57 PM      Failed - This refill cannot be delegated      Passed - Urine Drug Screen completed in last 360 days      Passed - Patient is not pregnant      Passed - Valid encounter within last 6 months    Recent Outpatient Visits           5 months ago Acute encephalopathy   [place] [facility] [place], Salvadore Oxford, NP   6 months ago Acute non-recurrent pansinusitis   [place] [facility] [place], [place] W, NP   9 months ago Coronary artery disease involving native coronary artery of native heart without angina pectoris   [place] [facility] [place], Salvadore Oxford, NP   10 months ago Essential hypertension    [facility] [place], Gentry Fitz A, RPH-CPP   11 months ago Generalized abdominal pain    [facility] [place], Salvadore Oxford, NP       Future Appointments             In 3 weeks Wyline Mood,  MD [facility] at [place]   In 1 month [place], Salvadore Oxford, NP [place] [facility], [place]   In 8 months Richardo Hanks, Laurette Schimke, MD [facility]

## 2023-09-12 ENCOUNTER — Other Ambulatory Visit: Payer: Self-pay | Admitting: Cardiology

## 2023-09-13 NOTE — Telephone Encounter (Signed)
last office visit: 07/03/23 with plan to f/u in 6 months.    next office visit: none/active recall

## 2023-09-14 ENCOUNTER — Encounter: Payer: Self-pay | Admitting: Cardiology

## 2023-09-15 ENCOUNTER — Other Ambulatory Visit: Payer: Self-pay | Admitting: Internal Medicine

## 2023-09-16 MED ORDER — ASPIRIN 81 MG PO TBEC: 81.0000 mg | DELAYED_RELEASE_TABLET | Freq: Every day | ORAL | Status: DC

## 2023-09-16 NOTE — Telephone Encounter (Signed)
 I think we can convert him back to ASA 81 mg & stop Plavix   [facility]

## 2023-09-18 ENCOUNTER — Encounter: Payer: Self-pay | Admitting: Neurology

## 2023-09-18 NOTE — Telephone Encounter (Signed)
 Rx discontinued on 08/03/23 due to change in therapy.  Requested Prescriptions  Pending Prescriptions Disp Refills   ezetimibe  (ZETIA ) 10 MG tablet [Pharmacy Med Name: EZETIMIBE  10 MG TABLET] 90 tablet 2    Sig: TAKE 1 TABLET BY MOUTH EVERY DAY     Cardiovascular:  Antilipid - Sterol Transport Inhibitors Failed - 09/18/2023 12:46 PM      Failed - Lipid Panel in normal range within the last 12 months    Cholesterol  Date Value Ref Range Status  03/26/2023 164 0 - 200 mg/dL Final   LDL Cholesterol (Calc)  Date Value Ref Range Status  11/11/2021 103 (H) mg/dL (calc) Final    Comment:    Reference range: <100 . Desirable range <100 mg/dL for primary prevention;   <70 mg/dL for patients with CHD or diabetic patients  with > or = 2 CHD risk factors. SABRA LDL-C is now calculated using the Martin-Hopkins  calculation, which is a validated novel method providing  better accuracy than the Friedewald equation in the  estimation of LDL-C.  Gladis APPLETHWAITE et al. SANDREA. 7986;689(80): 2061-2068  (http://education.[employer].com/faq/FAQ164)    LDL Cholesterol  Date Value Ref Range Status  03/26/2023 101 (H) 0 - 99 mg/dL Final    Comment:           Total Cholesterol/HDL:CHD Risk Coronary Heart Disease Risk Table                     Men   Women  1/2 Average Risk   3.4   3.3  Average Risk       5.0   4.4  2 X Average Risk   9.6   7.1  3 X Average Risk  23.4   11.0        Use the calculated Patient Ratio above and the CHD Risk Table to determine the patient's CHD Risk.        ATP III CLASSIFICATION (LDL):  <100     mg/dL   Optimal  899-870  mg/dL   Near or Above                    Optimal  130-159  mg/dL   Borderline  839-810  mg/dL   High  >809     mg/dL   Very High Performed at [facility], [address] [place], [place] [address]    Direct LDL  Date Value Ref Range Status  01/30/2022 98.5 0 - 99 mg/dL Final    Comment:    Performed at [facility], [address], [place], [place] [address]   HDL  Date Value Ref Range Status  03/26/2023 49 >40 mg/dL Final   Triglycerides  Date Value Ref Range Status  03/26/2023 70 <150 mg/dL Final         Passed - AST in normal range and within 360 days    AST  Date Value Ref Range Status  07/06/2023 17 0 - 40 IU/L Final         Passed - ALT in normal range and within 360 days    ALT  Date Value Ref Range Status  07/06/2023 15 0 - 44 IU/L Final         Passed - Patient is not pregnant      Passed - Valid encounter within last 12 months    Recent Outpatient Visits           5 months  ago Acute encephalopathy   [place] [facility] [place], Angeline ORN, NP   6 months ago Acute non-recurrent pansinusitis   [place] [facility] [place], [place] W, NP   10 months ago Coronary artery disease involving native coronary artery of native heart without angina pectoris   [place] [facility] [place], Angeline ORN, NP   10 months ago Essential hypertension   [place] [facility] D[facility], Sharyle LABOR, RPH-CPP   1 year ago Generalized abdominal pain   [place] [facility] [place], Angeline ORN, NP       Future Appointments             In 1 week Therisa Bi, MD [facility] [place] [facility] at [place]   In 1 month [place], Angeline ORN, NP  [facility], [facility]   In 8 months [place]a, Redell BROCKS, MD [facility] [place]

## 2023-09-20 ENCOUNTER — Telehealth: Payer: Medicare Other | Admitting: Nurse Practitioner

## 2023-09-20 DIAGNOSIS — J00 Acute nasopharyngitis [common cold]: Secondary | ICD-10-CM | POA: Diagnosis not present

## 2023-09-20 DIAGNOSIS — R42 Dizziness and giddiness: Secondary | ICD-10-CM | POA: Diagnosis not present

## 2023-09-20 DIAGNOSIS — H6993 Unspecified Eustachian tube disorder, bilateral: Secondary | ICD-10-CM | POA: Diagnosis not present

## 2023-09-20 MED ORDER — IPRATROPIUM BROMIDE 0.03 % NA SOLN: 2.0000 | Freq: Two times a day (BID) | NASAL | 12 refills | Status: AC

## 2023-09-20 NOTE — Progress Notes (Signed)
 Virtual Visit Consent   Grant Young, you are scheduled for a virtual visit with a [facility] provider today. Just as with appointments in the office, your consent must be obtained to participate. Your consent will be active for this visit and any virtual visit you may have with one of our providers in the next 365 days. If you have a MyChart account, a copy of this consent can be sent to you electronically.  As this is a virtual visit, video technology does not allow for your provider to perform a traditional examination. This may limit your provider's ability to fully assess your condition. If your provider identifies any concerns that need to be evaluated in person or the need to arrange testing (such as labs, EKG, etc.), we will make arrangements to do so. Although advances in technology are sophisticated, we cannot ensure that it will always work on either your end or our end. If the connection with a video visit is poor, the visit may have to be switched to a telephone visit. With either a video or telephone visit, we are not always able to ensure that we have a secure connection.  By engaging in this virtual visit, you consent to the provision of healthcare and authorize for your insurance to be billed (if applicable) for the services provided during this visit. Depending on your insurance coverage, you may receive a charge related to this service.  I need to obtain your verbal consent now. Are you willing to proceed with your visit today? Grant Young has provided verbal consent on 09/20/2023 for a virtual visit (video or telephone). Grant Kitty, FNP  Date: 09/20/2023 4:52 PM  Virtual Visit via Video Note   I, Grant Young, connected with  Grant Young  ([id number], [id number]) on 09/20/23 at  5:00 PM EST by a video-enabled telemedicine application and verified that I am speaking with the correct person using two identifiers.  Location: Patient: Virtual Visit Location Patient:  Home Provider: Virtual Visit Location Provider: Home Office   I discussed the limitations of evaluation and management by telemedicine and the availability of in person appointments. The patient expressed understanding and agreed to proceed.    History of Present Illness: Grant Young is a 78 y.o. who identifies as a male who was assigned male at birth, and is being seen today for symptoms he associates with vertigo.   Starting on 09/18/23 in the evening he has felt dizzy He describes it as his brain moving from one side of his head to the other   He feels dizzy the most when standing up and moving/changing positions  He does feel that his balance is also effected  If he turns over in bed he will also experience these symptoms   Symptoms have been persistent throughout day and night since onset  He does stated that he has had nasal congestion  Denies pain/pressure in his ears   Denies chest pain  Denies SOB   Denies any weakness to extremities   He has experienced this in the past and has been diagnosed with vertigo in the past  He has been treated for orthostasis as well   He was hospitalized 6 months ago due to uncontrolled HTN  He has been monitoring his BP at home  Most recent recordings are: 153/85 (09/18/23) 126/71 (09/19/23) Today 2pm 142/81 & 136/70  He denies a headache in the past two days  Denies confusion or changes in cognitive status ( per  patient and wife)   He was recently taken off Plavix  and switched to 81mg  ASA daily   He did try dramamine over the weekend without relief   Problems:  Patient Active Problem List   Diagnosis Date Noted   Dyspepsia 07/27/2023   Low ferritin 07/06/2023   Statin myopathy 12/31/2022   History of cold sores 11/20/2022   Coronary artery disease involving native coronary artery of native heart without angina pectoris 08/18/2022   Chronic migraine without aura 11/28/2021   Restless leg syndrome 09/01/2021   Orthostasis  09/01/2021   OAB (overactive bladder) 12/03/2020   GERD (gastroesophageal reflux disease) 10/24/2020   Hypotestosteronism 10/24/2020   IBS (irritable bowel syndrome) 10/24/2020   Lyme disease 10/24/2020   Lumbar facet arthropathy 12/19/2019   Chronic pain syndrome 12/19/2019   Tremor 09/27/2019   Labile hypertension 09/21/2019   Intractable headache 12/21/2001   Anxiety and depression 12/21/2001   Hyperlipidemia LDL goal <70 09/30/2001   Peripheral polyneuropathy 09/15/1999    Allergies:  Allergies  Allergen Reactions   Ace Inhibitors     Other reaction(s): Cough   Fluoxetine Anxiety    made me fall asleep per pt bad headaches and makes  Me  Crazy historical allergy noted in [employer] made me fall asleep per pt bad headaches and makes  Me  Crazy Per New Patient Packet.     Metoclopramide     Other reaction(s): Other (See Comments), Other (See Comments), Unknown Tardive Dyskinesia  historical allergy noted in [employer] Tardive Dyskinesia  Per New Patient Packet.   Nalbuphine     Used Post Back surgery- Anesthesiologist Error. Patient had Narcotic Withdraw. Per New Patient Packet.    Other     Other reaction(s): Other (See Comments) Altered mental status in combo with narcotics at previous hospitalization - Full Withdrawal Symptoms Other reaction(s): Rash   Amoxicillin-Pot Clavulanate Nausea Only    Per New Patient Packet.   Doxazosin Rash    Other reaction(s): Other - See Comments, Rash UNKNOWN REACTION UNKNOWN REACTION    Duloxetine Nausea Only    Per New Patient Packet.    Penicillins Nausea Only    Per New Patient Packet.   Tamsulosin Itching and Anxiety    Restless, Flushing, Heavy Chest, Itching, Hyperactive mood and Anxiety. Unable to handle side effects. Per New Patient Packet.     Trazodone  And Nefazodone Itching, Anxiety and Rash    Headache. INCREASED MY ANXIETY AND HEARTRATE Flushing, tachycardia INCREASED MY ANXIETY AND HEARTRATE Per  New Patient Packet.    Amlodipine     Shaking, unsure of reaction. Per New Patient Packet.     Cinoxacin     GI Intolerance, and Dizziness. Per New Patient Packet.    Ciprofloxacin     Other reaction(s): Unknown   Fludrocortisone  Other (See Comments)    Worsening headaches, GI issues, fatigue   Nebivolol     Other reaction(s): Unknown   Olanzapine     Headache and unable to sleep for 3 nights. Per New Patient Packet.    Olmesartan     Other reaction(s): Unknown   Pregabalin     Confusion, Lack of concentration, dizziness, and likely drowsiness. Per New Patient Packet.     Prostaglandins     Other reaction(s): Other (See Comments) Intolerance   Thyroid  Hormones     Other reaction(s): Other (See Comments) Thyroid  (Nature Thyroid ) contraindicated with some of your other medications.   Zolpidem     Nightmares, Ineffective after 2 days.  Per New Patient Packet.    Duloxetine Hcl     Other reaction(s): Rash   Fluoxetine Hcl     Other reaction(s): Rash   Nucynta  [Tapentadol ] Other (See Comments)    Vertigo    Phenytoin Anxiety    Hyperactivity, and Ineffective. Per New Patient Packet.    Medications:  Current Outpatient Medications:    AMBULATORY NON FORMULARY MEDICATION, Apply 1 patch topically 3 (three) times a week. Medication Name: iron with vit C patch, Disp: , Rfl:    AMBULATORY NON FORMULARY MEDICATION, Take 2 capsules by mouth at bedtime. Medication Name: plas myc, Disp: , Rfl:    amitriptyline  (ELAVIL ) 10 MG tablet, Take 3 tablets (30 mg total) by mouth at bedtime., Disp: 90 tablet, Rfl: 11   anastrozole  (ARIMIDEX ) 1 MG tablet, 1/2 tablet (0.5 mg) by mouth once weekly, Disp: , Rfl:    aspirin  EC 81 MG tablet, Take 1 tablet (81 mg total) by mouth daily. Swallow whole., Disp: , Rfl:    Butalbital -APAP-Caffeine  50-300-40 MG CAPS, Take 1 capsule by mouth every 6 (six) hours as needed., Disp: 90 capsule, Rfl: 0   cetirizine (ZYRTEC) 10 MG tablet, Take 10 mg by mouth daily  as needed for allergies., Disp: , Rfl:    Cholecalciferol  50 MCG (2000 UT) CAPS, Take 1 tablet by mouth daily., Disp: , Rfl:    cholestyramine  (QUESTRAN ) 4 g packet, TAKE 1 PACKET (4 G TOTAL) BY MOUTH 3 (THREE) TIMES DAILY., Disp: , Rfl:    Cyanocobalamin  (VITAMIN B-12) 5000 MCG LOZG, Take 1 lozenge by mouth daily. , Disp: , Rfl:    DHEA 25 MG CAPS, Take 25 mg by mouth daily., Disp: , Rfl:    diazepam  (VALIUM ) 5 MG tablet, TAKE 1 TABLET BY MOUTH EVERY MORNING AND 2 TABLETS EVERY NIGHT AT BEDTIME, Disp: 90 tablet, Rfl: 0   dicyclomine  (BENTYL ) 10 MG capsule, TAKE 1 CAPSULE (10 MG TOTAL) BY MOUTH 4 TIMES A DAY BEFORE MEALS AND AT BEDTIME, Disp: 90 capsule, Rfl: 1   Digestive Aids Mixture (DIGESTION GB PO), Take 1 capsule by mouth daily., Disp: , Rfl:    escitalopram  (LEXAPRO ) 20 MG tablet, Take 1 tablet (20 mg total) by mouth daily., Disp: 90 tablet, Rfl: 1   ezetimibe -simvastatin  (VYTORIN ) 10-20 MG tablet, Take 1 tablet by mouth daily., Disp: 30 tablet, Rfl: 11   hydrALAZINE  (APRESOLINE ) 25 MG tablet, Take 1 tablet (25 mg total) by mouth 2 (two) times daily. In the morning and at dinner, may take extra tablet for BP greater than 160, Disp: 180 tablet, Rfl: 3   Loperamide  HCl (IMODIUM  PO), Take by mouth as needed. , Disp: , Rfl:    MAGNESIUM  BISGLYCINATE PO, Take by mouth daily., Disp: , Rfl:    melatonin 5 MG TABS, Take 5 mg by mouth at bedtime., Disp: , Rfl:    Misc Natural Products (GLUTALOEMINE) POWD, Take 1 Scoop by mouth daily., Disp: , Rfl:    NONFORMULARY OR COMPOUNDED ITEM, Bi-Mix Papaverine 30mg , Phentolamine 1mg    Dosage: Inject 0.25cc-0.5cc as need for ED   Vial 1ml   Qty #5 Refills 6   [facility] [phone] Fax [phone], Disp: 5 each, Rfl: 6   Nutritional Supplements (NUTRITIONAL SUPPLEMENT PO), Take 1 capsule by mouth daily. [name], Disp: , Rfl:    POTASSIUM PO, Take 1 tablet by mouth as directed. Takes 5x weekly., Disp: , Rfl:    PREVIDENT 5000 DRY MOUTH  1.1 % GEL dental gel,  SMARTSIG:To Teeth 3 Times Daily, Disp: , Rfl:    Probiotic Product (PROBIOTIC DAILY PO), Take by mouth daily., Disp: , Rfl:    prochlorperazine  (COMPAZINE ) 10 MG tablet, Take 1 tablet (10 mg total) by mouth 2 (two) times daily as needed for nausea or vomiting., Disp: 180 tablet, Rfl: 0   propranolol  (INDERAL ) 10 MG tablet, Take 1 tablet (10 mg total) by mouth 2 (two) times daily as needed for up to 720 doses., Disp: 60 tablet, Rfl: 11   propranolol  ER (INDERAL  LA) 60 MG 24 hr capsule, Take 1 capsule (60 mg total) by mouth 2 (two) times daily. At Lunch and Bedtime, Disp: 180 capsule, Rfl: 3   pyridostigmine  (MESTINON ) 60 MG tablet, Take 0.5 tablets (30 mg total) by mouth 2 (two) times daily., Disp: 120 tablet, Rfl: 0   RABEprazole  (ACIPHEX ) 20 MG tablet, Take 2 tablets (40 mg total) by mouth 2 (two) times daily., Disp: 360 tablet, Rfl: 3   sucralfate  (CARAFATE ) 1 g tablet, Take 1 tablet (1 g total) by mouth 4 (four) times daily., Disp: 360 tablet, Rfl: 3   testosterone  cypionate (DEPOTESTOSTERONE CYPIONATE) 200 MG/ML injection, Inject 80 mg into the muscle every 14 (fourteen) days., Disp: , Rfl:    valACYclovir  (VALTREX ) 1000 MG tablet, Take 2 tabs p.o. and repeat in 12 hours as needed for cold sore, Disp: 30 tablet, Rfl: 0  Observations/Objective: Patient is well-developed, well-nourished in no acute distress.  Resting comfortably  at home.  Head is normocephalic, atraumatic.  No labored breathing.  Speech is clear and coherent with logical content.  Patient is alert and oriented at baseline.    Assessment and Plan:  1. Vertigo (Primary) Advised patient to move patient to call PCP in am for follow up appointment   Move positions slowly as discussed  Continue to monitor BP  If BP becomes elevated, with onset of new symptoms or worsening symptoms advised in person evaluation in UC/ED   Wife is present during visit and aware of symptoms to watch for   Possible Ddx  for vertigo symptoms  2. Dysfunction of both eustachian tubes 3. Acute rhinitis     Meds ordered this encounter  Medications   ipratropium (ATROVENT ) 0.03 % nasal spray    Sig: Place 2 sprays into both nostrils every 12 (twelve) hours.    Dispense:  30 mL    Refill:  12      Follow Up Instructions: I discussed the assessment and treatment plan with the patient. The patient was provided an opportunity to ask questions and all were answered. The patient agreed with the plan and demonstrated an understanding of the instructions.  A copy of instructions were sent to the patient via MyChart unless otherwise noted below.    The patient was advised to call back or seek an in-person evaluation if the symptoms worsen or if the condition fails to improve as anticipated.    Grant Kitty, FNP

## 2023-09-21 ENCOUNTER — Telehealth: Payer: Self-pay

## 2023-09-21 MED ORDER — AMITRIPTYLINE HCL 10 MG PO TABS: 30.0000 mg | ORAL_TABLET | Freq: Every day | ORAL | 3 refills | Status: DC

## 2023-09-21 NOTE — Telephone Encounter (Signed)
 Copied from CRM [id number]. Topic: General - Other >> Sep 21, 2023  2:10 PM [name] wrote: Reason for CRM: The patient would like to speak with a member of practice staff when possible regarding their telemed visit from yesterday 09/20/23  The patient shares that they are concerned about their ongoing vertigo concerns and their inability to be treated  Please contact the patient further when available

## 2023-09-24 ENCOUNTER — Ambulatory Visit: Payer: Medicare Other | Admitting: Internal Medicine

## 2023-09-27 ENCOUNTER — Telehealth: Payer: Medicare Other | Admitting: Gastroenterology

## 2023-09-27 DIAGNOSIS — K257 Chronic gastric ulcer without hemorrhage or perforation: Secondary | ICD-10-CM

## 2023-09-27 NOTE — Progress Notes (Signed)
 Ruel Kung , MD [address]  [address]  [place], [place] [address]  Main: [phone]  Fax: [phone]   Primary Care Physician: Antonette Angeline ORN, NP  Virtual Visit via Video Note  I connected with patient on 09/27/23 at  1:15 PM EST by video and verified that I am speaking with the correct person using two identifiers.   I discussed the limitations, risks, security and privacy concerns of performing an evaluation and management service by video  and the availability of in person appointments. I also discussed with the patient that there may be a patient responsible charge related to this service. The patient expressed understanding and agreed to proceed.  Location of Patient: Home Location of Provider: Home Persons involved: Patient and provider only   History of Present Illness: Chief Complaint  Patient presents with   dyspepsia    HPI: Grant Young is a 78 y.o. male  Summary of history :   He was initially referred and seen in February 2021 for GERD.    He has had his gallbladder taken out in 2017, has a history of diarrhea,  He has been evaluated in the past by a gastroenterologist in [place]  and underwent an upper endoscopy per his recollection which he recalls was normal except for a few small polyps.  He has also had a colonoscopy in the past.At his initial visit felt that his symptoms are suggestive of dyspepsia.  AcipHex  which he was on was giving him good control of his symptoms.  Plan was to try him on peppermint oil capsules which he was provided samples of which.     In the past 2022 has been seen for elevated alkaline phosphatase with normal, GGT indicating that the elevated alkaline phosphatase is not related to his liver full autoimmune and viral hepatitis workup has been negative.     10/03/2020: EGD: Normal study, colonoscopy: Internal hemorrhoids noted medium in size.  No polyps seen random colon biopsies taken which were negative for  microscopic colitis.  Biopsies of the terminal ileum were also normal.    3 rounds of hemorrhoidal banding completed in October 2022. In October 2023 was seen for constipation as he been taking Norco as well as reflux.  We increase his Aciphex  to twice a day and we tried Linzess  72 mcg but it was too much for him because diarrhea and he called our office and we changed it to MiraLAX    Interval history 05/27/2023-09/26/2022   07/27/2023: EGD: 3 non bleeding gastric ulcers small in size seen at the pre pyloric area- bx taken -acute gastritis- no H pylori   Increased PPI to omeprazole 40 mg BID with carafate .  Since his upper endoscopy he has been taking his PPI twice a day and he has been doing well with no abdominal pain denies any constipation denies any diarrhea takes his cholestyramine  once a week and helps him.  No other complaints presently. His Plavix  has been stopped and he has been changed to 81 mg of aspirin  he denies any use of NSAIDs previously or presently.      Current Outpatient Medications  Medication Sig Dispense Refill   AMBULATORY NON FORMULARY MEDICATION Apply 1 patch topically 3 (three) times a week. Medication Name: iron with vit C patch     amitriptyline  (ELAVIL ) 10 MG tablet Take 3 tablets (30 mg total) by mouth at bedtime. 270 tablet 3   anastrozole  (ARIMIDEX ) 1 MG tablet 1/2 tablet (0.5 mg)  by mouth once weekly     aspirin  EC 81 MG tablet Take 1 tablet (81 mg total) by mouth daily. Swallow whole.     Butalbital -APAP-Caffeine  50-300-40 MG CAPS Take 1 capsule by mouth every 6 (six) hours as needed. 90 capsule 0   cetirizine (ZYRTEC) 10 MG tablet Take 10 mg by mouth daily as needed for allergies.     Cholecalciferol  50 MCG (2000 UT) CAPS Take 1 tablet by mouth daily.     cholestyramine  (QUESTRAN ) 4 g packet TAKE 1 PACKET (4 G TOTAL) BY MOUTH 3 (THREE) TIMES DAILY.     Cyanocobalamin  (VITAMIN B-12) 5000 MCG LOZG Take 1 lozenge by mouth daily.      DHEA 25 MG CAPS Take 25  mg by mouth daily.     diazepam  (VALIUM ) 5 MG tablet TAKE 1 TABLET BY MOUTH EVERY MORNING AND 2 TABLETS EVERY NIGHT AT BEDTIME 90 tablet 0   dicyclomine  (BENTYL ) 10 MG capsule TAKE 1 CAPSULE (10 MG TOTAL) BY MOUTH 4 TIMES A DAY BEFORE MEALS AND AT BEDTIME 90 capsule 1   Digestive Aids Mixture (DIGESTION GB PO) Take 1 capsule by mouth daily.     escitalopram  (LEXAPRO ) 20 MG tablet Take 1 tablet (20 mg total) by mouth daily. 90 tablet 1   ezetimibe -simvastatin  (VYTORIN ) 10-20 MG tablet Take 1 tablet by mouth daily. 30 tablet 11   hydrALAZINE  (APRESOLINE ) 25 MG tablet Take 1 tablet (25 mg total) by mouth 2 (two) times daily. In the morning and at dinner, may take extra tablet for BP greater than 160 180 tablet 3   ipratropium (ATROVENT ) 0.03 % nasal spray Place 2 sprays into both nostrils every 12 (twelve) hours. 30 mL 12   Loperamide  HCl (IMODIUM  PO) Take by mouth as needed.      MAGNESIUM  BISGLYCINATE PO Take by mouth daily.     melatonin 5 MG TABS Take 5 mg by mouth at bedtime.     Misc Natural Products (GLUTALOEMINE) POWD Take 1 Scoop by mouth daily.     NONFORMULARY OR COMPOUNDED ITEM Bi-Mix Papaverine 30mg , Phentolamine 1mg    Dosage: Inject 0.25cc-0.5cc as need for ED   Vial 1ml   Qty #5 Refills 6   [facility] [phone] Fax [phone] 5 each 6   Nutritional Supplements (NUTRITIONAL SUPPLEMENT PO) Take 1 capsule by mouth daily. [name]     POTASSIUM PO Take 1 tablet by mouth as directed. Takes 5x weekly.     PREVIDENT 5000 DRY MOUTH 1.1 % GEL dental gel SMARTSIG:To Teeth 3 Times Daily     Probiotic Product (PROBIOTIC DAILY PO) Take by mouth daily.     prochlorperazine  (COMPAZINE ) 10 MG tablet Take 1 tablet (10 mg total) by mouth 2 (two) times daily as needed for nausea or vomiting. 180 tablet 0   propranolol  (INDERAL ) 10 MG tablet Take 1 tablet (10 mg total) by mouth 2 (two) times daily as needed for up to 720 doses. 60 tablet 11   propranolol  ER  (INDERAL  LA) 60 MG 24 hr capsule Take 1 capsule (60 mg total) by mouth 2 (two) times daily. At [facility] and Bedtime 180 capsule 3   pyridostigmine  (MESTINON ) 60 MG tablet Take 0.5 tablets (30 mg total) by mouth 2 (two) times daily. 120 tablet 0   RABEprazole  (ACIPHEX ) 20 MG tablet Take 2 tablets (40 mg total) by mouth 2 (two) times daily. 360 tablet 3   sucralfate  (CARAFATE ) 1 g tablet Take 1 tablet (1 g  total) by mouth 4 (four) times daily. 360 tablet 3   testosterone  cypionate (DEPOTESTOSTERONE CYPIONATE) 200 MG/ML injection Inject 80 mg into the muscle every 14 (fourteen) days.     valACYclovir  (VALTREX ) 1000 MG tablet Take 2 tabs p.o. and repeat in 12 hours as needed for cold sore 30 tablet 0   No current facility-administered medications for this visit.    Allergies as of 09/27/2023 - Review Complete 09/27/2023  Allergen Reaction Noted   Ace inhibitors  08/24/2019   Fluoxetine Anxiety 10/31/2019   Metoclopramide  10/31/2019   Nalbuphine  10/31/2019   Other  01/03/2014   Amoxicillin-pot clavulanate Nausea Only 10/31/2019   Doxazosin Rash 02/01/2013   Duloxetine Nausea Only 10/31/2019   Penicillins Nausea Only 10/31/2019   Tamsulosin Itching and Anxiety 02/01/2013   Trazodone  and nefazodone Itching, Anxiety, and Rash 02/13/2012   Amlodipine  10/31/2019   Cinoxacin  10/31/2019   Ciprofloxacin  08/24/2019   Fludrocortisone  Other (See Comments) 08/04/2021   Nebivolol  08/24/2019   Olanzapine  10/31/2019   Olmesartan  08/24/2019   Pregabalin  10/31/2019   Prostaglandins  05/07/2014   Thyroid  hormones  06/09/2018   Zolpidem  10/31/2019   Duloxetine hcl  01/03/2014   Fluoxetine hcl  01/03/2014   Nucynta  [tapentadol ] Other (See Comments) 07/15/2021   Phenytoin Anxiety 10/31/2019    Review of Systems:    All systems reviewed and negative except where noted in HPI.  General Appearance:    Alert, cooperative, no distress, appears stated age  Head:    Normocephalic, without obvious  abnormality, atraumatic  Eyes:    PERRL, conjunctiva/corneas clear,  Ears:    Grossly normal hearing    Neurologic:  Grossly normal    Observations/Objective:  Labs: CMP     Component Value Date/Time   NA 145 (H) 07/06/2023 1154   K 4.1 07/06/2023 1154   CL 105 07/06/2023 1154   CO2 25 07/06/2023 1154   GLUCOSE 75 07/06/2023 1154   GLUCOSE 92 04/20/2023 1502   BUN 11 07/06/2023 1154   CREATININE 0.84 07/06/2023 1154   CREATININE 0.76 09/18/2022 1025   CALCIUM  9.4 07/06/2023 1154   PROT 6.1 07/06/2023 1154   ALBUMIN 4.2 07/06/2023 1154   AST 17 07/06/2023 1154   ALT 15 07/06/2023 1154   ALKPHOS 129 (H) 07/06/2023 1154   BILITOT 0.3 07/06/2023 1154   GFRNONAA >60 04/20/2023 1502   GFRAA 101 05/20/2020 0000   Lab Results  Component Value Date   WBC 6.2 07/06/2023   HGB 14.8 07/06/2023   HCT 46.5 07/06/2023   MCV 94 07/06/2023   PLT 237 07/06/2023    Imaging Studies: No results found.  Assessment and Plan:   CESAREO VICKREY is a 78 y.o. y/o male who has had a prior history of bile salt mediated diarrhea.  He has had dyspeptic symptoms in the past possibly irritable bowel syndrome.  Send upper endoscopy showed gastric ulcers biopsies showed acute gastritis denies any NSAID use.  He has found relief with increasing the dose of PPI to twice a day taking 2 tablets in the morning and 2 in the evening of Aciphex .  Since he is doing well I told him he can reduce the dose of Aciphex  just once a day 2 tablets in the morning.  He has stopped using the Plavix  and he has changed over to baby aspirin .  He can continue the same.  He wanted to know how long he should take the Carafate   I said he can stop it right now and use it as needed if he has symptoms that recur he could give us  back or call.  There is no reason to repeat his upper endoscopy as the ulcers were very tiny and had no concerning features.        I discussed the assessment and treatment plan with the patient. The  patient was provided an opportunity to ask questions and all were answered. The patient agreed with the plan and demonstrated an understanding of the instructions.   The patient was advised to call back or seek an in-person evaluation if the symptoms worsen or if the condition fails to improve as anticipated.  I provided 15 minutes of face-to-face time during this encounter.  Dr Ruel Kung MD,MRCP [facility]) Gastroenterology/Hepatology Pager: [phone]   Speech recognition software was used to dictate this note.

## 2023-09-29 ENCOUNTER — Encounter: Payer: Medicare Other | Admitting: Neurology

## 2023-10-02 ENCOUNTER — Encounter: Payer: Self-pay | Admitting: Internal Medicine

## 2023-10-04 ENCOUNTER — Other Ambulatory Visit: Payer: Self-pay

## 2023-10-06 ENCOUNTER — Encounter: Payer: Self-pay | Admitting: Pharmacist

## 2023-10-07 MED ORDER — DIAZEPAM 5 MG PO TABS: ORAL_TABLET | ORAL | 0 refills | Status: DC

## 2023-10-11 MED ORDER — PROCHLORPERAZINE MALEATE 10 MG PO TABS: 10.0000 mg | ORAL_TABLET | Freq: Two times a day (BID) | ORAL | 0 refills | Status: DC | PRN

## 2023-10-11 NOTE — Addendum Note (Signed)
Addended by: Lorre Munroe on: 10/11/2023 09:34 AM   Modules accepted: Orders

## 2023-10-15 LAB — LIPID PANEL
Chol/HDL Ratio: 2.5 {ratio} (ref 0.0–5.0)
Cholesterol, Total: 108 mg/dL (ref 100–199)
HDL: 43 mg/dL (ref >39)
LDL Chol Calc (NIH): 50 mg/dL (ref 0–99)
Triglycerides: 68 mg/dL (ref 0–149)
VLDL Cholesterol Cal: 15 mg/dL (ref 5–40)

## 2023-10-15 LAB — HEPATIC FUNCTION PANEL
ALT: 11 [IU]/L (ref 0–44)
AST: 18 [IU]/L (ref 0–40)
Albumin: 4.1 g/dL (ref 3.8–4.8)
Alkaline Phosphatase: 126 [IU]/L — ABNORMAL HIGH (ref 44–121)
Bilirubin Total: 0.4 mg/dL (ref 0.0–1.2)
Bilirubin, Direct: 0.15 mg/dL (ref 0.00–0.40)
Total Protein: 6.2 g/dL (ref 6.0–8.5)

## 2023-10-18 ENCOUNTER — Encounter: Payer: Self-pay | Admitting: Pharmacist

## 2023-10-19 MED ORDER — EZETIMIBE-SIMVASTATIN 10-20 MG PO TABS: 1.0000 | ORAL_TABLET | Freq: Every day | ORAL | 3 refills | Status: AC

## 2023-10-29 ENCOUNTER — Ambulatory Visit (INDEPENDENT_AMBULATORY_CARE_PROVIDER_SITE_OTHER): Payer: Medicare Other | Admitting: Internal Medicine

## 2023-10-29 ENCOUNTER — Encounter: Payer: Self-pay | Admitting: Internal Medicine

## 2023-10-29 VITALS — BP 132/78 | Ht 69.0 in | Wt 145.2 lb

## 2023-10-29 DIAGNOSIS — Z8619 Personal history of other infectious and parasitic diseases: Secondary | ICD-10-CM

## 2023-10-29 DIAGNOSIS — E785 Hyperlipidemia, unspecified: Secondary | ICD-10-CM

## 2023-10-29 DIAGNOSIS — K219 Gastro-esophageal reflux disease without esophagitis: Secondary | ICD-10-CM

## 2023-10-29 DIAGNOSIS — G2581 Restless legs syndrome: Secondary | ICD-10-CM

## 2023-10-29 DIAGNOSIS — G629 Polyneuropathy, unspecified: Secondary | ICD-10-CM

## 2023-10-29 DIAGNOSIS — I951 Orthostatic hypotension: Secondary | ICD-10-CM | POA: Diagnosis not present

## 2023-10-29 DIAGNOSIS — G43719 Chronic migraine without aura, intractable, without status migrainosus: Secondary | ICD-10-CM | POA: Diagnosis not present

## 2023-10-29 DIAGNOSIS — I2510 Atherosclerotic heart disease of native coronary artery without angina pectoris: Secondary | ICD-10-CM | POA: Diagnosis not present

## 2023-10-29 DIAGNOSIS — F419 Anxiety disorder, unspecified: Secondary | ICD-10-CM

## 2023-10-29 DIAGNOSIS — R251 Tremor, unspecified: Secondary | ICD-10-CM

## 2023-10-29 DIAGNOSIS — G894 Chronic pain syndrome: Secondary | ICD-10-CM

## 2023-10-29 DIAGNOSIS — K582 Mixed irritable bowel syndrome: Secondary | ICD-10-CM

## 2023-10-29 DIAGNOSIS — F32A Depression, unspecified: Secondary | ICD-10-CM

## 2023-10-29 DIAGNOSIS — A6920 Lyme disease, unspecified: Secondary | ICD-10-CM

## 2023-10-29 DIAGNOSIS — N3281 Overactive bladder: Secondary | ICD-10-CM

## 2023-10-29 DIAGNOSIS — M47816 Spondylosis without myelopathy or radiculopathy, lumbar region: Secondary | ICD-10-CM

## 2023-10-29 DIAGNOSIS — E349 Endocrine disorder, unspecified: Secondary | ICD-10-CM

## 2023-10-29 DIAGNOSIS — R0989 Other specified symptoms and signs involving the circulatory and respiratory systems: Secondary | ICD-10-CM

## 2023-10-29 NOTE — Patient Instructions (Signed)
Diet for Irritable Bowel Syndrome When you have irritable bowel syndrome (IBS), it is very important to follow the eating habits that are best for your condition. IBS may cause various symptoms, such as pain in the abdomen, constipation, or diarrhea. Choosing the right foods can help to ease the discomfort from these symptoms. Work with your health care provider and dietitian to find the eating plan that will help to control your symptoms. What are tips for following this plan?  Keep a food diary. This will help you identify foods that cause symptoms. Write down: What you eat and when you eat it. What symptoms you have. When symptoms occur in relation to your meals, such as "pain in abdomen 2 hours after dinner." Eat your meals slowly and in a relaxed setting. Aim to eat 5-6 small meals per day. Do not skip meals. Drink enough fluid to keep your urine pale yellow. Ask your health care provider if you should take an over-the-counter probiotic to help restore healthy bacteria in your gut (digestive tract). Probiotics are foods that contain good bacteria and yeasts. Your dietitian may have specific dietary recommendations for you based on your symptoms. Your dietitian may recommend that you: Avoid foods that cause symptoms. Talk with your dietitian about other ways to get the same nutrients that are in those problem foods. Avoid foods with gluten. Gluten is a protein that is found in rye, wheat, and barley. Eat more foods that contain soluble fiber. Examples of foods with high soluble fiber include oats, seeds, and certain fruits and vegetables. Take a fiber supplement if told by your dietitian. Reduce or avoid certain foods called FODMAPs. These are foods that contain sugars that are hard for some people to digest. Ask your health care provider which foods to avoid. What foods should I avoid? The following are some foods and drinks that may make your symptoms worse: Fatty foods, such as french  fries. Foods that contain gluten, such as pasta and cereal. Dairy products, such as milk, cheese, and ice cream. Spicy foods. Alcohol. Products with caffeine, such as coffee, tea, or chocolate. Carbonated drinks, such as soda. Foods that are high in FODMAPs. These include certain fruits and vegetables. Products with sweeteners such as honey, high fructose corn syrup, sorbitol, and mannitol. The items listed above may not be a complete list of foods and beverages you should avoid. Contact a dietitian for more information. What foods are good sources of fiber? Your health care provider or dietitian may recommend that you eat more foods that contain fiber. Fiber can help to reduce constipation and other IBS symptoms. Add foods with fiber to your diet a little at a time so your body can get used to them. Too much fiber at one time might cause gas and swelling of your abdomen. The following are some foods that are good sources of fiber: Berries, such as raspberries, strawberries, and blueberries. Tomatoes. Carrots. Brown rice. Oats. Seeds, such as chia and pumpkin seeds. The items listed above may not be a complete list of recommended sources of fiber. Contact your dietitian for more options. Where to find more information International Foundation for Functional Gastrointestinal Disorders: aboutibs.[employer] of Diabetes and Digestive and Kidney Diseases: StageSync.si Summary When you have irritable bowel syndrome (IBS), it is very important to follow the eating habits that are best for your condition. IBS may cause various symptoms, such as pain in the abdomen, constipation, or diarrhea. Choosing the right foods can help to ease the  discomfort that comes from symptoms. Your health care provider or dietitian may recommend that you eat more foods that contain fiber. Keep a food diary. This will help you identify foods that cause symptoms. This information is not intended to replace  advice given to you by your health care provider. Make sure you discuss any questions you have with your health care provider. Document Revised: 08/12/2021 Document Reviewed: 08/12/2021 Elsevier Patient Education  2024 [employer].

## 2023-10-29 NOTE — Assessment & Plan Note (Signed)
Continue escitalopram and diazepam Support offered

## 2023-10-29 NOTE — Assessment & Plan Note (Signed)
Controlled on hydralazine and mestinon for orthostasis Reinforced DASH diet

## 2023-10-29 NOTE — Assessment & Plan Note (Signed)
No angina Lipid profile reviewed Continue ezetimibe, simvastatin, propranolol, aspirin and plavix Encourage low-fat diet

## 2023-10-29 NOTE — Assessment & Plan Note (Signed)
Continue amitriptyline, propranolol and fioricet He will continue to follow with neurology

## 2023-10-29 NOTE — Assessment & Plan Note (Signed)
Currently not medicated

## 2023-10-29 NOTE — Assessment & Plan Note (Signed)
He will continue to follow-up with integrative medicine

## 2023-10-29 NOTE — Assessment & Plan Note (Signed)
Continue testosterone injections and arimidex He will continue to follow with urology

## 2023-10-29 NOTE — Assessment & Plan Note (Signed)
Continue rabeprazole and carafate Try to identify and avoid triggers

## 2023-10-29 NOTE — Progress Notes (Signed)
Subjective:    Patient ID: Grant Young, male    DOB: 23-May-1946, 78 y.o.   MRN: [id number]  HPI  Patient presents to clinic today for follow-up of chronic conditions.  Anxiety and depression: Chronic, managed on escitalopram and diazepam. He is not currently seeing a therapist.  He denies SI/HI.  Migraines: These occur about 1 x week.  He is not able to identify his triggers.  He is taking amitriptyline, propranolol and fioricet as prescribed. He follows with neurology.  HTN: His BP today is 132/78.  He is taking hydralazine and taking mestinon as prescribed for orthostasis.  ECG from 04/2022 reviewed.  HLD with CAD status post MI: His last LDL was 50, triglycerides 68, 09/2023.  He is taking ezetimibe, simvastatin, propranolol, aspirin and plavix as prescribed. He tries to consume a low-fat diet.  Chronic back pain/neuropathy/RLS: Status post multiple back surgeries and spinal cord stimulator placement. He is no longer taking hydrocodone. He follows with pain management.  History of cold sores: He denies recent outbreak.  He takes Valacyclovir only as needed.  GERD: He is not sure what triggers this.  He denies breakthrough on rabeprazole and carafate.  Upper GI from 07/2023 reviewed.  Hypotestosteronism: Managed with testosterone injections and arimidex.  He follows with urology.  IBS: Alternating constipation and diarrhea.  He manages this with questran, bentyl, miralax and imodium as needed.  He follows with GI.  History of lyme disease: He is not currently taking any medications for this.  He follows with integrative medication for this.  OAB: Mainly urinary frequency, but improved since he stopped chlorthalidone.  He is not taking any medications for this at this time.  He follows with urology.  Parkinsonism/tremor: He is being weaned off propranolol and will be starting sinemet.  He follows with neurology at [facility].  Review of Systems     Past Medical History:  Diagnosis  Date  . Anemia   . Aneurysm of ascending aorta (HCC)    a.) CT chest 08/18/2022: 4.1 cm; b.) CT chest 12/07/2022: 4.2 cm  . Anxiety    a.) on BZO (diazepam) PRN  . Aortic atherosclerosis (HCC)   . Chronic back pain   . Coronary artery disease    a.) s/p PCI with DES x 2 (pLCx and o-mLAD) 11/27/2021  . Depression   . Erectile dysfunction    a.) Bi-mix (papaverine + phentolamine) injections + exogenous testosterone injections  . GERD (gastroesophageal reflux disease)   . h/o Lyme disease   . Headache   . Hiatal hernia   . History of bilateral cataract extraction 01/2021  . History of gastritis   . History of kidney stones   . History of neuropathy   . Hyperlipidemia LDL goal <70    a.) CAD with NSTEMI -->  intolerant of statins with statin myopathy and memory issues.;  Also intolerant of Repatha  . Hypertension    Labile blood pressures but dizziness with blood pressures in the "normal range"; intolerant of most medications including ARB's, ACE inhibitor's, amlodipine most beta-blockers other than propranolol.  . Insomnia    a.) takes malatonin PRN  . Left lower lobe pulmonary nodule    a.) chest CT 12/07/2022: nodules x 2 posteromedial LLL;  8 x 11 mm and 8 x 10 mm  . Long term current use of antithrombotics/antiplatelets    a.) DAPT (ASA + clopidogrel)  . Long term current use of aromatase inhibitor    a,) anastrozole --> estridiol  suppression secondary to exogenous testosterone use  . Lumbar radicular pain   . Myocardial infarction (HCC)    a.) MI x 2 - 1990 * 1999 - PTCA  . NSTEMI (non-ST elevated myocardial infarction) (HCC) 11/27/2021   a.) LHC 11/28/2021: sequential 75% o-pLAD, 70% OM2, 80% p-mLAD, 99% mLAD, 100% pLCx --> PCI placing a 2.5 x 26 mm Onyx Frontier DES to pLCx and a 2.5 x 24 mm Onyx Frontier DES to the o-m LAD (covering 3 lesions)  . Overactive bladder   . Pneumonia   . PONV (postoperative nausea and vomiting)   . Recurrent herpes labialis    a.) has  suppressive valacyclovir to use PRN  . Skin cancer, basal cell   . Spinal cord stimulator status    01/08/21 - not currently using.  Marland Kitchen Squamous cell skin cancer   . Substance abuse (HCC)     Current Outpatient Medications  Medication Sig Dispense Refill  . AMBULATORY NON FORMULARY MEDICATION Apply 1 patch topically 3 (three) times a week. Medication Name: iron with vit C patch    . amitriptyline (ELAVIL) 10 MG tablet Take 3 tablets (30 mg total) by mouth at bedtime. 270 tablet 3  . anastrozole (ARIMIDEX) 1 MG tablet 1/2 tablet (0.5 mg) by mouth once weekly    . aspirin EC 81 MG tablet Take 1 tablet (81 mg total) by mouth daily. Swallow whole.    . Butalbital-APAP-Caffeine 50-300-40 MG CAPS Take 1 capsule by mouth every 6 (six) hours as needed. 90 capsule 0  . cetirizine (ZYRTEC) 10 MG tablet Take 10 mg by mouth daily as needed for allergies.    . Cholecalciferol 50 MCG (2000 UT) CAPS Take 1 tablet by mouth daily.    . cholestyramine (QUESTRAN) 4 g packet TAKE 1 PACKET (4 G TOTAL) BY MOUTH 3 (THREE) TIMES DAILY.    Marland Kitchen Cyanocobalamin (VITAMIN B-12) 5000 MCG LOZG Take 1 lozenge by mouth daily.     Marland Kitchen DHEA 25 MG CAPS Take 25 mg by mouth daily.    . diazepam (VALIUM) 5 MG tablet TAKE 1 TABLET BY MOUTH EVERY MORNING AND 2 TABLETS EVERY NIGHT AT BEDTIME 90 tablet 0  . dicyclomine (BENTYL) 10 MG capsule TAKE 1 CAPSULE (10 MG TOTAL) BY MOUTH 4 TIMES A DAY BEFORE MEALS AND AT BEDTIME 90 capsule 1  . Digestive Aids Mixture (DIGESTION GB PO) Take 1 capsule by mouth daily.    Marland Kitchen escitalopram (LEXAPRO) 20 MG tablet Take 1 tablet (20 mg total) by mouth daily. 90 tablet 1  . ezetimibe-simvastatin (VYTORIN) 10-20 MG tablet Take 1 tablet by mouth daily. 90 tablet 3  . hydrALAZINE (APRESOLINE) 25 MG tablet Take 1 tablet (25 mg total) by mouth 2 (two) times daily. In the morning and at dinner, may take extra tablet for BP greater than 160 180 tablet 3  . ipratropium (ATROVENT) 0.03 % nasal spray Place 2 sprays  into both nostrils every 12 (twelve) hours. 30 mL 12  . Loperamide HCl (IMODIUM PO) Take by mouth as needed.     Marland Kitchen MAGNESIUM BISGLYCINATE PO Take by mouth daily.    . melatonin 5 MG TABS Take 5 mg by mouth at bedtime.    . Misc Natural Products (GLUTALOEMINE) POWD Take 1 Scoop by mouth daily.    . NONFORMULARY OR COMPOUNDED ITEM Bi-Mix Papaverine 30mg , Phentolamine 1mg    Dosage: Inject 0.25cc-0.5cc as need for ED   Vial 1ml   Qty #5 Refills 6   [facility] [phone] Fax [phone] 5 each 6  . Nutritional Supplements (NUTRITIONAL SUPPLEMENT PO) Take 1 capsule by mouth daily. [name]    . POTASSIUM PO Take 1 tablet by mouth as directed. Takes 5x weekly.    Marland Kitchen PREVIDENT 5000 DRY MOUTH 1.1 % GEL dental gel SMARTSIG:To Teeth 3 Times Daily    . Probiotic Product (PROBIOTIC DAILY PO) Take by mouth daily.    . prochlorperazine (COMPAZINE) 10 MG tablet Take 1 tablet (10 mg total) by mouth 2 (two) times daily as needed for nausea or vomiting. 180 tablet 0  . propranolol (INDERAL) 10 MG tablet Take 1 tablet (10 mg total) by mouth 2 (two) times daily as needed for up to 720 doses. 60 tablet 11  . propranolol ER (INDERAL LA) 60 MG 24 hr capsule Take 1 capsule (60 mg total) by mouth 2 (two) times daily. At [facility] and Bedtime 180 capsule 3  . pyridostigmine (MESTINON) 60 MG tablet Take 0.5 tablets (30 mg total) by mouth 2 (two) times daily. 120 tablet 0  . RABEprazole (ACIPHEX) 20 MG tablet Take 2 tablets (40 mg total) by mouth 2 (two) times daily. 360 tablet 3  . sucralfate (CARAFATE) 1 g tablet Take 1 tablet (1 g total) by mouth 4 (four) times daily. 360 tablet 3  . testosterone cypionate (DEPOTESTOSTERONE CYPIONATE) 200 MG/ML injection Inject 80 mg into the muscle every 14 (fourteen) days.    . valACYclovir (VALTREX) 1000 MG tablet Take 2 tabs p.o. and repeat in 12 hours as needed for cold sore 30 tablet 0   No current facility-administered medications for this visit.     Allergies  Allergen Reactions  . Ace Inhibitors     Other reaction(s): Cough  . Fluoxetine Anxiety    "made me fall asleep" per pt "bad headaches and "makes  Me  Crazy" historical allergy noted in [employer] "made me fall asleep" per pt "bad headaches and "makes  Me  Crazy" Per New Patient Packet.    . Metoclopramide     Other reaction(s): Other (See Comments), Other (See Comments), Unknown Tardive Dyskinesia  historical allergy noted in [employer] Tardive Dyskinesia  Per New Patient Packet.  . Nalbuphine     Used Post Back surgery- Anesthesiologist Error. Patient had Narcotic Withdraw. Per New Patient Packet.   . Other     Other reaction(s): Other (See Comments) Altered mental status in combo with narcotics at previous hospitalization - Full Withdrawal Symptoms Other reaction(s): Rash  . Amoxicillin-Pot Clavulanate Nausea Only    Per New Patient Packet.  . Doxazosin Rash    Other reaction(s): Other - See Comments, Rash UNKNOWN REACTION UNKNOWN REACTION   . Duloxetine Nausea Only    Per New Patient Packet.   Marland Kitchen Penicillins Nausea Only    Per New Patient Packet.  . Tamsulosin Itching and Anxiety    Restless, Flushing, Heavy Chest, Itching, Hyperactive mood and Anxiety. Unable to handle side effects. Per New Patient Packet.    . Trazodone And Nefazodone Itching, Anxiety and Rash    Headache. "INCREASED MY ANXIETY AND HEARTRATE" Flushing, tachycardia "INCREASED MY ANXIETY AND HEARTRATE" Per New Patient Packet.   . Amlodipine     Shaking, unsure of reaction. Per New Patient Packet.    . Cinoxacin     GI Intolerance, and Dizziness. Per New Patient Packet.   . Ciprofloxacin     Other reaction(s): Unknown  . Fludrocortisone Other (See Comments)    "Worsening headaches, GI issues, fatigue"  .  Nebivolol     Other reaction(s): Unknown  . Olanzapine     Headache and unable to sleep for 3 nights. Per New Patient Packet.   . Olmesartan     Other reaction(s): Unknown   . Pregabalin     Confusion, Lack of concentration, dizziness, and likely drowsiness. Per New Patient Packet.    . Prostaglandins     Other reaction(s): Other (See Comments) Intolerance  . Thyroid Hormones     Other reaction(s): Other (See Comments) Thyroid (Nature Thyroid) contraindicated with some of your other medications.  . Zolpidem     Nightmares, Ineffective after 2 days. Per New Patient Packet.   . Duloxetine Hcl     Other reaction(s): Rash  . Fluoxetine Hcl     Other reaction(s): Rash  . Nucynta [Tapentadol] Other (See Comments)    Vertigo   . Phenytoin Anxiety    Hyperactivity, and Ineffective. Per New Patient Packet.     Family History  Problem Relation Age of Onset  . Heart disease Father   . Heart failure Father   . Hypertension Father   . Stroke Father   . Stroke Mother   . Dementia Mother   . Kidney Stones Daughter   . Anxiety disorder Daughter   . OCD Daughter     Social History   Socioeconomic History  . Marital status: Married    Spouse name: Therapist, art  . Number of children: Not on file  . Years of education: Not on file  . Highest education level: Master's degree (e.g., MA, MS, MEng, MEd, MSW, MBA)  Occupational History  . Not on file  Tobacco Use  . Smoking status: Never    Passive exposure: Never  . Smokeless tobacco: Never  Vaping Use  . Vaping status: Never Used  Substance and Sexual Activity  . Alcohol use: Yes    Comment: 1 Drink a Month, socially  . Drug use: Not Currently  . Sexual activity: Yes    Birth control/protection: None  Other Topics Concern  . Not on file  Social History Narrative   Tobacco use, amount per day now: None   Past tobacco use, amount per day: None   How many years did you use tobacco: 0   Alcohol use (drinks per week): 0-1 Month   Diet:   Do you drink/eat things with caffeine: Occasionally ( Hot Chocolate and maybe 1/4 of 16oz Pepsi 2-3 times a week.   Marital status: Married                                   What year were you married? 1970   Do you live in a house, apartment, assisted living, condo, trailer, etc.? House   Is it one or more stories? One   How many persons live in your home? 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 Yes                                    Type and how often? [place], Recumbent Bike, 3 times a week. Try to get a 1.2 mile walk at least 3 times a week or  more.     Do you have a living will? Yes   Do you have a DNR form?  No                                 If not, do you want to discuss one?   Do you have signed POA/HPOA forms? Yes                       If so, please bring to you appointment    Do you have any difficulty bathing or dressing yourself? No    Do you have difficulty preparing food or eating? No   Do you have difficulty managing your medications? No   Do you have any difficulty managing your finances? No   Do you have any difficulty affording your medications? No         Social Drivers of Corporate investment banker Strain: Low Risk  (10/25/2023)   Overall Financial Resource Strain (CARDIA)   . Difficulty of Paying Living Expenses: Not hard at all  Food Insecurity: No Food Insecurity (10/25/2023)   Hunger Vital Sign   . Worried About Programme researcher, broadcasting/film/video in the Last Year: Never true   . Ran Out of Food in the Last Year: Never true  Transportation Needs: No Transportation Needs (10/25/2023)   PRAPARE - Transportation   . Lack of Transportation (Medical): No   . Lack of Transportation (Non-Medical): No  Physical Activity: Insufficiently Active (10/25/2023)   Exercise Vital Sign   . Days of Exercise per Week: 3 days   . Minutes of Exercise per Session: 40 min  Stress: No Stress Concern Present (10/25/2023)   [employer] of Occupational Health - Occupational Stress Questionnaire    . Feeling of Stress : Only a little  Social Connections: Moderately Integrated (10/25/2023)   Social Connection and Isolation Panel [NHANES]   . Frequency of Communication with Friends and Family: Twice a week   . Frequency of Social Gatherings with Friends and Family: Once a week   . Attends Religious Services: 1 to 4 times per year   . Active Member of Clubs or Organizations: No   . Attends Banker Meetings: Never   . Marital Status: Married  Catering manager Violence: Not At Risk (03/25/2023)   Humiliation, Afraid, Rape, and Kick questionnaire   . Fear of Current or Ex-Partner: No   . Emotionally Abused: No   . Physically Abused: No   . Sexually Abused: No     Constitutional: Patient reports chronic fatigue, frequent headaches.  Denies fever, malaise, or abrupt weight changes.  HEENT: Denies eye pain, eye redness, ear pain, ringing in the ears, wax buildup, runny nose, nasal congestion, bloody nose, or sore throat. Respiratory: Pt reports intermittent shortness of breath. Denies difficulty breathing, cough or sputum production.   Cardiovascular: Denies chest pain, chest tightness, palpitations or swelling in the hands or feet.  Gastrointestinal: Patient reports intermittent abdominal cramping, constipation and diarrhea.  Denies abdominal pain, bloating, or blood in the stool.  GU: Patient reports urinary frequency.  Denies urgency, pain with urination, burning sensation, blood in urine, odor or discharge. Musculoskeletal: Patient reports chronic joint pain, difficulty with gait.  Denies decrease in range of motion, muscle pain or joint swelling.  Skin: Denies redness, rashes, lesions or ulcercations.  Neurological: Patient reports neuropathic pain, dizziness, restless legs and tremor.  Denies difficulty with memory, difficulty with speech or problems with balance and coordination.  Psych: Patient has a history of anxiety and depression.  Denies SI/HI.  No other specific  complaints in a complete review of systems (except as listed in HPI above).  Objective:   Physical Exam  BP 132/78 (BP Location: Left Arm, Patient Position: Sitting, Cuff Size: Normal)   Ht 5\' 9"  (1.753 m)   Wt 145 lb 3.2 oz (65.9 kg)   BMI 21.44 kg/m    Wt Readings from Last 3 Encounters:  08/18/23 138 lb (62.6 kg)  07/27/23 142 lb (64.4 kg)  07/13/23 136 lb 6.4 oz (61.9 kg)    General: Appears his stated age, well developed, well nourished in NAD. Skin: No rash noted. HEENT: Head: normal shape and size; Eyes: sclera white, no icterus, conjunctiva pink, PERRLA and EOMs intact;  Neck:  Neck supple, trachea midline. No masses, lumps or thyromegaly present.  Cardiovascular: Normal rate and rhythm. S1,S2 noted.  No murmur, rubs or gallops noted. No JVD or BLE edema. No carotid bruits noted. Pulmonary/Chest: Normal effort and positive vesicular breath sounds. No respiratory distress. No wheezes, rales or ronchi noted.  Abdomen: Soft and nontender. Normal bowel sounds.  Musculoskeletal: Gait slow and steady without device. Neurological: Alert and oriented. Cranial nerves II-XII grossly intact. Coordination normal but resting tremor noted in bilateral hands.  Psychiatric: Mood and affect normal. Behavior is normal. Judgment and thought content normal.    BMET    Component Value Date/Time   NA 145 (H) 07/06/2023 1154   K 4.1 07/06/2023 1154   CL 105 07/06/2023 1154   CO2 25 07/06/2023 1154   GLUCOSE 75 07/06/2023 1154   GLUCOSE 92 04/20/2023 1502   BUN 11 07/06/2023 1154   CREATININE 0.84 07/06/2023 1154   CREATININE 0.76 09/18/2022 1025   CALCIUM 9.4 07/06/2023 1154   GFRNONAA >60 04/20/2023 1502   GFRAA 101 05/20/2020 0000    Lipid Panel     Component Value Date/Time   CHOL 108 10/15/2023 0859   TRIG 68 10/15/2023 0859   HDL 43 10/15/2023 0859   CHOLHDL 2.5 10/15/2023 0859   CHOLHDL 3.3 03/26/2023 0629   VLDL 14 03/26/2023 0629   LDLCALC 50 10/15/2023 0859    LDLCALC 103 (H) 11/11/2021 1006    CBC    Component Value Date/Time   WBC 6.2 07/06/2023 1154   WBC 6.8 04/20/2023 1502   RBC 4.95 07/06/2023 1154   RBC 5.10 04/20/2023 1502   HGB 14.8 07/06/2023 1154   HCT 46.5 07/06/2023 1154   PLT 237 07/06/2023 1154   MCV 94 07/06/2023 1154   MCH 29.9 07/06/2023 1154   MCH 30.0 04/20/2023 1502   MCHC 31.8 07/06/2023 1154   MCHC 32.4 04/20/2023 1502   RDW 12.1 07/06/2023 1154   LYMPHSABS 1.6 07/06/2023 1154   MONOABS 1.2 (H) 04/19/2022 0952   EOSABS 0.0 07/06/2023 1154   BASOSABS 0.0 07/06/2023 1154    Hgb A1C Lab Results  Component Value Date   HGBA1C 5.0 07/06/2023           Assessment & Plan:     RTC in 6 months for follow-up of chronic conditions Nicki Reaper, NP

## 2023-10-29 NOTE — Assessment & Plan Note (Signed)
Not medicated Will monitor

## 2023-10-29 NOTE — Assessment & Plan Note (Signed)
Continue questran, bentyl, miraLAX and imodium as needed

## 2023-10-29 NOTE — Assessment & Plan Note (Signed)
Continue pain stimulator per pain management

## 2023-10-29 NOTE — Assessment & Plan Note (Signed)
Lipid profile reviewed Encouraged him to consume low-fat diet Continue ezetimibe and simvastatin

## 2023-10-29 NOTE — Assessment & Plan Note (Signed)
Continue valacyclovir as needed

## 2023-10-29 NOTE — Assessment & Plan Note (Signed)
Being weaned off propranolol and will be starting sinemet He will continue to follow with neurology

## 2023-10-29 NOTE — Assessment & Plan Note (Signed)
Continue Mestinon

## 2023-10-29 NOTE — Assessment & Plan Note (Signed)
No medicated at this time

## 2023-11-02 ENCOUNTER — Ambulatory Visit
Payer: Medicare Other | Attending: Student in an Organized Health Care Education/Training Program | Admitting: Student in an Organized Health Care Education/Training Program

## 2023-11-02 ENCOUNTER — Encounter: Payer: Self-pay | Admitting: Student in an Organized Health Care Education/Training Program

## 2023-11-02 VITALS — BP 158/87 | HR 92 | Temp 98.1°F | Resp 16 | Ht 69.0 in | Wt 145.0 lb

## 2023-11-02 DIAGNOSIS — G5690 Unspecified mononeuropathy of unspecified upper limb: Secondary | ICD-10-CM | POA: Insufficient documentation

## 2023-11-02 DIAGNOSIS — G5681 Other specified mononeuropathies of right upper limb: Secondary | ICD-10-CM | POA: Diagnosis not present

## 2023-11-02 DIAGNOSIS — G8929 Other chronic pain: Secondary | ICD-10-CM | POA: Insufficient documentation

## 2023-11-02 DIAGNOSIS — M25511 Pain in right shoulder: Secondary | ICD-10-CM | POA: Insufficient documentation

## 2023-11-02 NOTE — Progress Notes (Signed)
 Safety precautions to be maintained throughout the outpatient stay will include: orient to surroundings, keep bed in low position, maintain call bell within reach at all times, provide assistance with transfer out of bed and ambulation.

## 2023-11-02 NOTE — Patient Instructions (Signed)
 GENERAL RISKS AND COMPLICATIONS  What are the risk, side effects and possible complications? Generally speaking, most procedures are safe.  However, with any procedure there are risks, side effects, and the possibility of complications.  The risks and complications are dependent upon the sites that are lesioned, or the type of nerve block to be performed.  The closer the procedure is to the spine, the more serious the risks are.  Great care is taken when placing the radio frequency needles, block needles or lesioning probes, but sometimes complications can occur. Infection: Any time there is an injection through the skin, there is a risk of infection.  This is why sterile conditions are used for these blocks.  There are four possible types of infection. Localized skin infection. Central Nervous System Infection-This can be in the form of Meningitis, which can be deadly. Epidural Infections-This can be in the form of an epidural abscess, which can cause pressure inside of the spine, causing compression of the spinal cord with subsequent paralysis. This would require an emergency surgery to decompress, and there are no guarantees that the patient would recover from the paralysis. Discitis-This is an infection of the intervertebral discs.  It occurs in about 1% of discography procedures.  It is difficult to treat and it may lead to surgery.        2. Pain: the needles have to go through skin and soft tissues, will cause soreness.       3. Damage to internal structures:  The nerves to be lesioned may be near blood vessels or    other nerves which can be potentially damaged.       4. Bleeding: Bleeding is more common if the patient is taking blood thinners such as  aspirin, Coumadin, Ticiid, Plavix, etc., or if he/she have some genetic predisposition  such as hemophilia. Bleeding into the spinal canal can cause compression of the spinal  cord with subsequent paralysis.  This would require an emergency  surgery to  decompress and there are no guarantees that the patient would recover from the  paralysis.       5. Pneumothorax:  Puncturing of a lung is a possibility, every time a needle is introduced in  the area of the chest or upper back.  Pneumothorax refers to free air around the  collapsed lung(s), inside of the thoracic cavity (chest cavity).  Another two possible  complications related to a similar event would include: Hemothorax and Chylothorax.   These are variations of the Pneumothorax, where instead of air around the collapsed  lung(s), you may have blood or chyle, respectively.       6. Spinal headaches: They may occur with any procedures in the area of the spine.       7. Persistent CSF (Cerebro-Spinal Fluid) leakage: This is a rare problem, but may occur  with prolonged intrathecal or epidural catheters either due to the formation of a fistulous  track or a dural tear.       8. Nerve damage: By working so close to the spinal cord, there is always a possibility of  nerve damage, which could be as serious as a permanent spinal cord injury with  paralysis.       9. Death:  Although rare, severe deadly allergic reactions known as "Anaphylactic  reaction" can occur to any of the medications used.      10. Worsening of the symptoms:  We can always make thing worse.  What are the chances  of something like this happening? Chances of any of this occuring are extremely low.  By statistics, you have more of a chance of getting killed in a motor vehicle accident: while driving to the hospital than any of the above occurring .  Nevertheless, you should be aware that they are possibilities.  In general, it is similar to taking a shower.  Everybody knows that you can slip, hit your head and get killed.  Does that mean that you should not shower again?  Nevertheless always keep in mind that statistics do not mean anything if you happen to be on the wrong side of them.  Even if a procedure has a 1 (one) in a  1,000,000 (million) chance of going wrong, it you happen to be that one..Also, keep in mind that by statistics, you have more of a chance of having something go wrong when taking medications.  Who should not have this procedure? If you are on a blood thinning medication (e.g. Coumadin, Plavix, see list of "Blood Thinners"), or if you have an active infection going on, you should not have the procedure.  If you are taking any blood thinners, please inform your physician.  How should I prepare for this procedure? Do not eat or drink anything at least six hours prior to the procedure. Bring a driver with you .  It cannot be a taxi. Come accompanied by an adult that can drive you back, and that is strong enough to help you if your legs get weak or numb from the local anesthetic. Take all of your medicines the morning of the procedure with just enough water to swallow them. If you have diabetes, make sure that you are scheduled to have your procedure done first thing in the morning, whenever possible. If you have diabetes, take only half of your insulin dose and notify our nurse that you have done so as soon as you arrive at the clinic. If you are diabetic, but only take blood sugar pills (oral hypoglycemic), then do not take them on the morning of your procedure.  You may take them after you have had the procedure. Do not take aspirin or any aspirin-containing medications, at least eleven (11) days prior to the procedure.  They may prolong bleeding. Wear loose fitting clothing that may be easy to take off and that you would not mind if it got stained with Betadine or blood. Do not wear any jewelry or perfume Remove any nail coloring.  It will interfere with some of our monitoring equipment.  NOTE: Remember that this is not meant to be interpreted as a complete list of all possible complications.  Unforeseen problems may occur.  BLOOD THINNERS The following drugs contain aspirin or other products,  which can cause increased bleeding during surgery and should not be taken for 2 weeks prior to and 1 week after surgery.  If you should need take something for relief of minor pain, you may take acetaminophen which is found in Tylenol,m Datril, Anacin-3 and Panadol. It is not blood thinner. The products listed below are.  Do not take any of the products listed below in addition to any listed on your instruction sheet.  A.P.C or A.P.C with Codeine Codeine Phosphate Capsules #3 Ibuprofen Ridaura  ABC compound Congesprin Imuran rimadil  Advil Cope Indocin Robaxisal  Alka-Seltzer Effervescent Pain Reliever and Antacid Coricidin or Coricidin-D  Indomethacin Rufen  Alka-Seltzer plus Cold Medicine Cosprin Ketoprofen S-A-C Tablets  Anacin Analgesic Tablets or Capsules Coumadin  Korlgesic Salflex  Anacin Extra Strength Analgesic tablets or capsules CP-2 Tablets Lanoril Salicylate  Anaprox Cuprimine Capsules Levenox Salocol  Anexsia-D Dalteparin Magan Salsalate  Anodynos Darvon compound Magnesium Salicylate Sine-off  Ansaid Dasin Capsules Magsal Sodium Salicylate  Anturane Depen Capsules Marnal Soma  APF Arthritis pain formula Dewitt's Pills Measurin Stanback  Argesic Dia-Gesic Meclofenamic Sulfinpyrazone  Arthritis Bayer Timed Release Aspirin Diclofenac Meclomen Sulindac  Arthritis pain formula Anacin Dicumarol Medipren Supac  Analgesic (Safety coated) Arthralgen Diffunasal Mefanamic Suprofen  Arthritis Strength Bufferin Dihydrocodeine Mepro Compound Suprol  Arthropan liquid Dopirydamole Methcarbomol with Aspirin Synalgos  ASA tablets/Enseals Disalcid Micrainin Tagament  Ascriptin Doan's Midol Talwin  Ascriptin A/D Dolene Mobidin Tanderil  Ascriptin Extra Strength Dolobid Moblgesic Ticlid  Ascriptin with Codeine Doloprin or Doloprin with Codeine Momentum Tolectin  Asperbuf Duoprin Mono-gesic Trendar  Aspergum Duradyne Motrin or Motrin IB Triminicin  Aspirin plain, buffered or enteric coated  Durasal Myochrisine Trigesic  Aspirin Suppositories Easprin Nalfon Trillsate  Aspirin with Codeine Ecotrin Regular or Extra Strength Naprosyn Uracel  Atromid-S Efficin Naproxen Ursinus  Auranofin Capsules Elmiron Neocylate Vanquish  Axotal Emagrin Norgesic Verin  Azathioprine Empirin or Empirin with Codeine Normiflo Vitamin E  Azolid Emprazil Nuprin Voltaren  Bayer Aspirin plain, buffered or children's or timed BC Tablets or powders Encaprin Orgaran Warfarin Sodium  Buff-a-Comp Enoxaparin Orudis Zorpin  Buff-a-Comp with Codeine Equegesic Os-Cal-Gesic   Buffaprin Excedrin plain, buffered or Extra Strength Oxalid   Bufferin Arthritis Strength Feldene Oxphenbutazone   Bufferin plain or Extra Strength Feldene Capsules Oxycodone with Aspirin   Bufferin with Codeine Fenoprofen Fenoprofen Pabalate or Pabalate-SF   Buffets II Flogesic Panagesic   Buffinol plain or Extra Strength Florinal or Florinal with Codeine Panwarfarin   Buf-Tabs Flurbiprofen Penicillamine   Butalbital Compound Four-way cold tablets Penicillin   Butazolidin Fragmin Pepto-Bismol   Carbenicillin Geminisyn Percodan   Carna Arthritis Reliever Geopen Persantine   Carprofen Gold's salt Persistin   Chloramphenicol Goody's Phenylbutazone   Chloromycetin Haltrain Piroxlcam   Clmetidine heparin Plaquenil   Cllnoril Hyco-pap Ponstel   Clofibrate Hydroxy chloroquine Propoxyphen         Before stopping any of these medications, be sure to consult the physician who ordered them.  Some, such as Coumadin (Warfarin) are ordered to prevent or treat serious conditions such as "deep thrombosis", "pumonary embolisms", and other heart problems.  The amount of time that you may need off of the medication may also vary with the medication and the reason for which you were taking it.  If you are taking any of these medications, please make sure you notify your pain physician before you undergo any procedures.         Selective Nerve Root  Block Patient Information  Description: Specific nerve roots exit the spinal canal and these nerves can be compressed and inflamed by a bulging disc and bone spurs.  By injecting steroids on the nerve root, we can potentially decrease the inflammation surrounding these nerves, which often leads to decreased pain.  Also, by injecting local anesthesia on the nerve root, this can provide [name] helpful information to give to your referring doctor if it decreases your pain.  Selective nerve root blocks can be done along the spine from the neck to the low back depending on the location of your pain.   After numbing the skin with local anesthesia, a small needle is passed to the nerve root and the position of the needle is verified using x-ray pictures.  After the needle is  in correct position, we then deposit the medication.  You may experience a pressure sensation while this is being done.  The entire block usually lasts less than 15 minutes.  Conditions that may be treated with selective nerve root blocks: Low back and leg pain Spinal stenosis Diagnostic block prior to potential surgery Neck and arm pain Post laminectomy syndrome  Preparation for the injection:  Do not eat any solid food or dairy products within 8 hours of your appointment. You may drink clear liquids up to 3 hours before an appointment.  Clear liquids include water, black coffee, juice or soda.  No milk or cream please. You may take your regular medications, including pain medications, with a sip of water before your appointment.  Diabetics should hold regular insulin (if taken separately) and take 1/2 normal NPH dose the morning of the procedure.  Carry some sugar containing items with you to your appointment. A driver must accompany you and be prepared to drive you home after your procedure. Bring all your current medications with you. An IV may be inserted and sedation may be given at the discretion of the physician. A blood  pressure cuff, EKG, and other monitors will often be applied during the procedure.  Some patients may need to have extra oxygen administered for a short period. You will be asked to provide medical information, including allergies, prior to the procedure.  We must know immediately if you are taking blood  Thinners (like Coumadin) or if you are allergic to IV iodine contrast (dye).  Possible side-effects: All are usually temporary Bleeding from needle site Light headedness Numbness and tingling Decreased blood pressure Weakness in arms/legs Pressure sensation in back/neck Pain at injection site (several days)  Possible complications: All are extremely rare Infection Nerve injury Spinal headache (a headache wore with upright position)  Call if you experience: Fever/chills associated with headache or increased back/neck pain Headache worsened by an upright position New onset weakness or numbness of an extremity below the injection site Hives or difficulty breathing (go to the emergency room) Inflammation or drainage at the injection site(s) Severe back/neck pain greater than usual New symptoms which are concerning to you  Please note:  Although the local anesthetic injected can often make your back or neck feel good for several hours after the injection the pain will likely return.  It takes 3-5 days for steroids to work on the nerve root. You may not notice any pain relief for at least one week.  If effective, we will often do a series of 3 injections spaced 3-6 weeks apart to maximally decrease your pain.    If you have any questions, please call [phone] [facility]

## 2023-11-02 NOTE — Progress Notes (Signed)
PROVIDER NOTE: Information contained herein reflects review and annotations entered in association with encounter. Interpretation of such information and data should be left to medically-trained personnel. Information provided to patient can be located elsewhere in the medical record under "Patient Instructions". Document created using STT-dictation technology, any transcriptional errors that may result from process are unintentional.    Patient: Grant Young  Service Category: E/M  Provider: Edward Jolly, MD  DOB: 1946/06/22  DOS: 11/02/2023  Referring Provider: Lorre Munroe, NP  MRN: [id number]  Specialty: Interventional Pain Management  PCP: Lorre Munroe, NP  Type: Established Patient  Setting: Ambulatory outpatient    Location: Office  Delivery: Face-to-face     HPI  [name], a 78 y.o. year old male, is here today because of his Chronic right shoulder pain [M25.511, G89.29]. Mr. Kimball primary complain today is Shoulder Pain (right) and Back Pain (lower)  Pertinent problems: Mr. Formby has Lumbar facet arthropathy and Chronic pain syndrome on their pertinent problem list. Pain Assessment: Severity of Chronic pain is reported as a 8 /10. Location: Shoulder Right/"radiating zapping sensations a few inches up or down". Onset: More than a month ago. Quality:  . Timing:  . Modifying factor(s):  Marland Kitchen Vitals:  height is 5\' 9"  (1.753 m) and weight is 145 lb (65.8 kg). His temperature is 98.1 F (36.7 C). His blood pressure is 158/87 (abnormal) and his pulse is 92. His respiration is 16 and oxygen saturation is 100%.  BMI: Estimated body mass index is 21.41 kg/m as calculated from the following:   Height as of this encounter: 5\' 9"  (1.753 m).   Weight as of this encounter: 145 lb (65.8 kg). Last encounter: 06/21/2023. Last procedure: 06/30/2023.  Reason for encounter: patient-requested evaluation.   Discussed the use of AI scribe software for clinical note transcription with the patient,  who gave verbal consent to proceed.  History of Present Illness   Grant Young is a 78 year old male who presents with right shoulder pain.  The right shoulder pain began approximately two weeks ago following exercises such as shoulder raises, overhead press, and bench press. The pain is described as shooting and is localized to the right shoulder, particularly when lifting objects or raising the arm. Despite ceasing these exercises, the pain persists.  He has a history of right shoulder surgery about 40 years ago for recurrent subluxations, during which a pin was placed to stabilize the shoulder. He was able to engage in activities like water skiing without issue until the recent onset of pain.  He has been using ice and heat for pain relief and has avoided activities that exacerbate the pain, such as lifting with the right arm, except for minimal use like eating. No tingling in the fingers or difficulty with fine motor tasks, indicating the pain is localized to the shoulder area.         ROS  Constitutional: Denies any fever or chills Gastrointestinal: No reported hemesis, hematochezia, vomiting, or acute GI distress Musculoskeletal:  right shoulder pain Neurological: No reported episodes of acute onset apraxia, aphasia, dysarthria, agnosia, amnesia, paralysis, loss of coordination, or loss of consciousness  Medication Review  AMBULATORY NON FORMULARY MEDICATION, Butalbital-APAP-Caffeine, Cholecalciferol, DHEA, Digestive Aids Mixture, GlutAloeMine, Loperamide HCl, Magnesium Bisglycinate, NONFORMULARY OR COMPOUNDED ITEM, Nutritional Supplements, Potassium, Probiotic Product, RABEprazole, Vitamin B-12, amitriptyline, anastrozole, aspirin EC, cholestyramine, diazepam, dicyclomine, escitalopram, ezetimibe-simvastatin, hydrALAZINE, ipratropium, melatonin, prochlorperazine, propranolol, propranolol ER, pyridostigmine, sodium fluoride, sucralfate, testosterone cypionate, and  valACYclovir  History Review  Allergy: Mr. Callow is allergic to ace inhibitors, fluoxetine, metoclopramide, nalbuphine, other, amoxicillin-pot clavulanate, doxazosin, duloxetine, penicillins, tamsulosin, trazodone and nefazodone, amlodipine, cinoxacin, ciprofloxacin, fludrocortisone, nebivolol, olanzapine, olmesartan, pregabalin, prostaglandins, thyroid hormones, zolpidem, duloxetine hcl, fluoxetine hcl, nucynta [tapentadol], and phenytoin. Drug: Mr. Joos  reports that he does not currently use drugs. Alcohol:  reports current alcohol use. Tobacco:  reports that he has never smoked. He has never been exposed to tobacco smoke. He has never used smokeless tobacco. Social: Mr. Barrack  reports that he has never smoked. He has never been exposed to tobacco smoke. He has never used smokeless tobacco. He reports current alcohol use. He reports that he does not currently use drugs. Medical:  has a past medical history of Anemia, Aneurysm of ascending aorta (HCC), Anxiety, Aortic atherosclerosis (HCC), Chronic back pain, Coronary artery disease, Depression, Erectile dysfunction, GERD (gastroesophageal reflux disease), h/o Lyme disease, Headache, Hiatal hernia, History of bilateral cataract extraction (01/2021), History of gastritis, History of kidney stones, History of neuropathy, Hyperlipidemia LDL goal <70, Hypertension, Insomnia, Left lower lobe pulmonary nodule, Long term current use of antithrombotics/antiplatelets, Long term current use of aromatase inhibitor, Lumbar radicular pain, Myocardial infarction [facility]), NSTEMI (non-ST elevated myocardial infarction) (HCC) (11/27/2021), Overactive bladder, Pneumonia, PONV (postoperative nausea and vomiting), Recurrent herpes labialis, Skin cancer, basal cell, Spinal cord stimulator status, Squamous cell skin cancer, and Substance abuse (HCC). Surgical: Mr. Doshi  has a past surgical history that includes Kidney stone surgery (09/14/1980); Kidney stone surgery (09/15/1995);  Lithotripsy (09/14/2014); Lithotripsy (09/14/1996); Lithotripsy (09/14/1997); Gallbladder surgery (09/15/2015); Sigmoidoscopy (09/14/2017); Colonoscopy (09/15/2015); Cholecystectomy (2017); Shoulder surgery (Right, 1984); Back surgery; Spinal cord stimulator implant; Pain pump implantation; Pain pump removal; Tonsillectomy; Foot surgery (Bilateral, 03/18/2020); Foot surgery (08/032021); Upper gi endoscopy; Colonoscopy with propofol (N/A, 10/03/2020); Esophagogastroduodenoscopy (egd) with propofol (N/A, 10/03/2020); Cataract extraction w/PHACO (Left, 01/14/2021); Cataract extraction w/PHACO (Right, 01/28/2021); Coronary/Graft Acute MI Revascularization (N/A, 11/27/2021); LEFT HEART CATH AND CORONARY ANGIOGRAPHY (N/A, 11/27/2021); Excision basal cell carcinoma; transthoracic echocardiogram (11/28/2021); Video bronchoscopy with endobronchial ultrasound (N/A, 01/15/2023); Esophagogastroduodenoscopy (egd) with propofol (N/A, 07/27/2023); and biopsy (07/27/2023). Family: family history includes Anxiety disorder in his daughter; Dementia in his mother; Heart disease in his father; Heart failure in his father; Hypertension in his father; Kidney Stones in his daughter; OCD in his daughter; Stroke in his father and mother.  Laboratory Chemistry Profile   Renal Lab Results  Component Value Date   BUN 11 07/06/2023   CREATININE 0.84 07/06/2023   BCR 13 07/06/2023   GFRAA 101 05/20/2020   GFRNONAA >60 04/20/2023    Hepatic Lab Results  Component Value Date   AST 18 10/15/2023   ALT 11 10/15/2023   ALBUMIN 4.1 10/15/2023   ALKPHOS 126 (H) 10/15/2023   AMYLASE 32 09/18/2022   LIPASE 37 04/20/2023    Electrolytes Lab Results  Component Value Date   NA 145 (H) 07/06/2023   K 4.1 07/06/2023   CL 105 07/06/2023   CALCIUM 9.4 07/06/2023   MG 2.4 03/25/2023   PHOS 3.6 11/30/2021    Bone Lab Results  Component Value Date   VD25OH 55.7 07/06/2023   TESTOSTERONE 33.2 05/20/2020    Inflammation (CRP:  Acute Phase) (ESR: Chronic Phase) Lab Results  Component Value Date   CRP 1 07/06/2023   ESRSEDRATE 2 07/06/2023   LATICACIDVEN 1.4 08/18/2022         Note: Above Lab results reviewed.  Recent Imaging Review  NCV with EMG(electromyography) Levert Feinstein, MD  08/23/2023  8:58 AM        Full Name: Orien Mayhall Gender: Male MRN #: [id number] Date of Birth: August 11, 1946    Visit Date: 08/18/2023 13:13 Age: 39 Years Examining Physician: Dr. Levert Feinstein Referring Physician: Dr. Levert Feinstein Height: 5 feet 9 inch History: 78 year old male with with history of bilateral foot  paresthesia  Summary of the test: Nerve conduction study: Bilateral sural, superficial peroneal sensory responses were  absent.  Right ulnar sensory response showed mildly prolonged peak latency  with normal range snap amplitude.  Right median sensory response  showed moderately prolonged peak latency with moderately  decreased snap amplitude.  Right radial sensory response showed  mildly prolonged peak latency with mildly decreased snap  amplitude.  Right tibial, bilateral peroneal to EDB motor responses were  absent.  Left tibial motor responses showed moderately decreased  CMAP amplitude.  Right ulnar, median motor responses were normal.  Electromyography: Selected needle examinations were performed at bilateral lower  extremity muscles, lumbosacral paraspinal muscles.  There is evidence of chronic mild neuropathic changes involving  bilateral L4-5 myotomes.  There was no spontaneous activity involving bilateral lumbosacral  paraspinal muscles.  Conclusion: This is an abnormal study.  There is electrodiagnostic evidence  of moderate length-dependent axonal sensorimotor polyneuropathy.   In addition, there is evidence of mild bilateral lumbosacral  radiculopathy, involving bilateral L4-5 myotomes.  Levert Feinstein. M.D. Ph.D.   [facility] [address] [place], [place]  [address] Tel: [phone] Fax: [phone]  Verbal informed consent was obtained from the patient, patient  was informed of potential risk of procedure, including bruising,  bleeding, hematoma formation, infection, muscle weakness, muscle  pain, numbness, among others.        MNC    Nerve / Sites Muscle Latency Ref. Amplitude Ref. Rel Amp Segments  Distance Velocity Ref. Area    ms ms mV mV %  cm m/s m/s mVms  R Median - APB     Wrist APB 3.8 <=4.4 7.2 >=4.0 100 Wrist - APB 7   39.0     Upper arm APB 9.0  6.9  95.6 Upper arm - Wrist 26 50 >=49 37.4   R Ulnar - ADM     Wrist ADM 3.3 <=3.3 6.2 >=6.0 100 Wrist - ADM 7   23.1     B.Elbow ADM 5.4  5.3  86 B.Elbow - Wrist 11 52 >=49 20.7     A.Elbow ADM 9.3  5.8  109 A.Elbow - B.Elbow 19 49 >=49 23.9  R Peroneal - EDB     Ankle EDB NR <=6.5 NR >=2.0 NR Ankle - EDB 9   NR         Pop fossa - Ankle      L Peroneal - EDB     Ankle EDB NR <=6.5 NR >=2.0 NR Ankle - EDB 9   NR         Pop fossa - Ankle      R Tibial - AH     Ankle AH NR <=5.8 NR >=4.0 NR Ankle - AH 9   NR  L Tibial - AH     Ankle AH 5.5 <=5.8 2.5 >=4.0 100 Ankle - AH 9   4.7     Pop fossa AH 15.1  1.3  52.7 Pop fossa - Ankle 36 38 >=41 1.8                 SNC    Nerve /  Sites Rec. Site Peak Lat Ref.  Amp Ref. Segments Distance     ms ms V V  cm  R Radial - Anatomical snuff box (Forearm)     Forearm Wrist 3.1 <=2.9 9 >=15 Forearm - Wrist 10  R Sural - Ankle (Calf)     Calf Ankle NR <=4.4 NR >=6 Calf - Ankle 14  L Sural - Ankle (Calf)     Calf Ankle NR <=4.4 NR >=6 Calf - Ankle 14  R Superficial peroneal - Ankle     Lat leg Ankle NR <=4.4 NR >=6 Lat leg - Ankle 14  L Superficial peroneal - Ankle     Lat leg Ankle NR <=4.4 NR >=6 Lat leg - Ankle 14  R Median - Orthodromic (Dig II, Mid palm)     Dig II Wrist 4.6 <=3.4 4 >=10 Dig II - Wrist 13  R Ulnar - Orthodromic, (Dig V, Mid palm)     Dig V Wrist 3.6 <=3.1 5 >=5 Dig V - Wrist 50                   F  Wave     Nerve F Lat Ref.   ms ms  L Tibial - AH 48.3 <=56.0  R Ulnar - ADM 29.0 <=32.0         EMG Summary Table    Spontaneous MUAP Recruitment  Muscle IA Fib PSW Fasc Other Amp Dur. Poly Pattern  L. Tibialis anterior Increased None None None _______ Normal  Normal Normal Reduced  L. Tibialis posterior Normal None None None _______ Normal Normal  Normal Reduced  L. Peroneus longus Normal None None None _______ Normal Normal  Normal Reduced  L. Gastrocnemius (Medial head) Normal None None None _______  Normal Normal Normal Reduced  L. Vastus lateralis Normal None None None _______ Normal Normal  Normal Reduced  R. Tibialis anterior Normal None None None _______ Normal Normal  Normal Reduced  R. Tibialis posterior Normal None None None _______ Normal Normal  Normal Reduced  R. Peroneus longus Normal None None None _______ Normal Normal  Normal Reduced  R. Gastrocnemius (Medial head) Normal None None None _______  Normal Normal Normal Reduced  R. Vastus lateralis Normal None None None _______ Normal Normal  Normal Reduced  R. Lumbar paraspinals (low) Normal None None None _______ Normal  Normal Normal Normal  R. Lumbar paraspinals (mid) Normal None None None _______ Normal  Normal Normal Normal  L. Lumbar paraspinals (low) Normal None None None _______ Normal  Normal Normal Normal  L. Lumbar paraspinals (mid) Normal None None None _______ Normal  Normal Normal Normal     Note: Reviewed        Physical Exam  General appearance: Well nourished, well developed, and well hydrated. In no apparent acute distress Mental status: Alert, oriented x 3 (person, place, & time)       Respiratory: No evidence of acute respiratory distress Eyes: PERLA Vitals: BP (!) 158/87   Pulse 92   Temp 98.1 F (36.7 C)   Resp 16   Ht 5\' 9"  (1.753 m)   Wt 145 lb (65.8 kg)   SpO2 100%   BMI 21.41 kg/m  BMI: Estimated body mass index is 21.41 kg/m as calculated from the following:   Height as of  this encounter: 5\' 9"  (1.753 m).   Weight as of this encounter: 145 lb (65.8 kg). Ideal: Ideal body weight: 70.7 kg (155 lb 13.8 oz)  Cervical  Spine Area Exam  Skin & Axial Inspection: No masses, redness, edema, swelling, or associated skin lesions Alignment: Symmetrical Functional ROM: Unrestricted ROM      Stability: No instability detected Muscle Tone/Strength: Functionally intact. No obvious neuro-muscular anomalies detected. Sensory (Neurological): Unimpaired Palpation: No palpable anomalies             Upper Extremity (UE) Exam    Side: Right upper extremity  Side: Left upper extremity  Skin & Extremity Inspection: Skin color, temperature, and hair growth are WNL. No peripheral edema or cyanosis. No masses, redness, swelling, asymmetry, or associated skin lesions. No contractures.  Skin & Extremity Inspection: Skin color, temperature, and hair growth are WNL. No peripheral edema or cyanosis. No masses, redness, swelling, asymmetry, or associated skin lesions. No contractures.  Functional ROM: Pain restricted ROM for shoulder  Functional ROM: Unrestricted ROM          Muscle Tone/Strength: Functionally intact. No obvious neuro-muscular anomalies detected.  Muscle Tone/Strength: Functionally intact. No obvious neuro-muscular anomalies detected.  Sensory (Neurological): Musculoskeletal pain pattern          Sensory (Neurological): Unimpaired          Palpation: No palpable anomalies              Palpation: No palpable anomalies              Provocative Test(s):  Phalen's test: deferred Tinel's test: deferred Apley's scratch test (touch opposite shoulder):  Action 1 (Across chest): deferred Action 2 (Overhead): Decreased ROM Action 3 (LB reach): deferred   Provocative Test(s):  Phalen's test: deferred Tinel's test: deferred Apley's scratch test (touch opposite shoulder):  Action 1 (Across chest): deferred Action 2 (Overhead): deferred Action 3 (LB reach): deferred     Assessment    Diagnosis Status  1. Chronic right shoulder pain   2. Disorder of right suprascapular nerve   3. Neuropathy, axillary nerve    Having a Flare-up Having a Flare-up Having a Flare-up   Updated Problems: Problem  Chronic Right Shoulder Pain  Disorder of Right Suprascapular Nerve  Neuropathy, Axillary Nerve    Plan of Care  Problem-specific:  Assessment and Plan    Right Shoulder Pain Right shoulder pain has persisted for 2-3 weeks, worsening with activities like shoulder raises, overhead press, and bench press. The pain is severe and shooting, localized to the right shoulder, with a history of shoulder surgery and hardware placement 40 years ago. There are no issues with tingling or fine grip. Despite rest, ice, and heat, the pain has plateaued. Suprascapular and axillary nerve blocks were discussed as alternatives to joint injection due to the presence of hardware. These nerve blocks should address the pain dermatome and may aid healing by reducing pain. Agreed to proceed without sedation. Plan to perform suprascapular and axillary nerve blocks, reassess pain and range of motion post-procedure, and consider physical therapy once pain improves.   Mr. JAVONNE DORKO has a current medication list which includes the following long-term medication(s): amitriptyline, dicyclomine, escitalopram, ezetimibe-simvastatin, hydralazine, ipratropium, prochlorperazine, propranolol, pyridostigmine, rabeprazole, sucralfate, testosterone cypionate, and propranolol er.  Pharmacotherapy (Medications Ordered): No orders of the defined types were placed in this encounter.  Orders:  Orders Placed This Encounter  Procedures   SUPRASCAPULAR NERVE BLOCK    For shoulder pain.    Standing Status:   Future    Expected Date:   11/16/2023    Expiration Date:   01/30/2024    Scheduling Instructions:  Purpose: RIGHT     Level(s): Suprascapular notch     Sedation: Patient's choice.     Scheduling Timeframe:  As permitted by the schedule    Where will this procedure be performed?:   [facility]   AXILLARY NERVE BLOCK    Standing Status:   Future    Expiration Date:   01/30/2024    Scheduling Instructions:     PROCEDURE: Axillary Nerve Block (CPT [phone])     LATERALITY: RIGHT     TIMEFRAME: ASAA    Where will this procedure be performed?:   [facility]   Follow-up plan:   Return in 27 days (on 11/29/2023) for Right SSNB and Right Axillary NB , in clinic [facility].      Status post Botox No. 1 on 01/01/2020 for migraine, lumbar TPI as well. SPG block 03/13/20. Bilateral GONB 04/01/20. 11/13/20 B/L L3,4,5 Facet blocks helpful repeat prn                 Recent Visits No visits were found meeting these conditions. Showing recent visits within past 90 days and meeting all other requirements Today's Visits Date Type Provider Dept  11/02/23 Office Visit Edward Jolly, MD [facility]  Showing today's visits and meeting all other requirements Future Appointments Date Type Provider Dept  11/18/23 Appointment Edward Jolly, MD [facility]  Showing future appointments within next 90 days and meeting all other requirements  I discussed the assessment and treatment plan with the patient. The patient was provided an opportunity to ask questions and all were answered. The patient agreed with the plan and demonstrated an understanding of the instructions.  Patient advised to call back or seek an in-person evaluation if the symptoms or condition worsens.  Duration of encounter: .  Total time on encounter, as per AMA guidelines included both the face-to-face and non-face-to-face time personally spent by the physician and/or other qualified health care professional(s) on the day of the encounter (includes time in activities that require the physician or other qualified health care professional and does not include time in activities normally performed by clinical staff).  Physician's time may include the following activities when performed: Preparing to see the patient (e.g., pre-charting review of records, searching for previously ordered imaging, lab work, and nerve conduction tests) Review of prior analgesic pharmacotherapies. Reviewing PMP Interpreting ordered tests (e.g., lab work, imaging, nerve conduction tests) Performing post-procedure evaluations, including interpretation of diagnostic procedures Obtaining and/or reviewing separately obtained history Performing a medically appropriate examination and/or evaluation Counseling and educating the patient/family/caregiver Ordering medications, tests, or procedures Referring and communicating with other health care professionals (when not separately reported) Documenting clinical information in the electronic or other health record Independently interpreting results (not separately reported) and communicating results to the patient/ family/caregiver Care coordination (not separately reported)  Note by: Edward Jolly, MD Date: 11/02/2023; Time: 12:16 PM

## 2023-11-05 ENCOUNTER — Other Ambulatory Visit: Payer: Self-pay | Admitting: Internal Medicine

## 2023-11-07 ENCOUNTER — Encounter: Payer: Self-pay | Admitting: Internal Medicine

## 2023-11-08 MED ORDER — PYRIDOSTIGMINE BROMIDE 60 MG PO TABS: 30.0000 mg | ORAL_TABLET | Freq: Two times a day (BID) | ORAL | 0 refills | Status: DC

## 2023-11-08 MED ORDER — DIAZEPAM 5 MG PO TABS: ORAL_TABLET | ORAL | 0 refills | Status: DC

## 2023-11-16 ENCOUNTER — Ambulatory Visit: Payer: Medicare Other | Admitting: Student in an Organized Health Care Education/Training Program

## 2023-11-17 ENCOUNTER — Ambulatory Visit
Admission: RE | Admit: 2023-11-17 | Discharge: 2023-11-17 | Disposition: A | Discharge status: Home / Self Care | Source: Ambulatory Visit | Attending: Student in an Organized Health Care Education/Training Program | Admitting: Student in an Organized Health Care Education/Training Program

## 2023-11-17 ENCOUNTER — Ambulatory Visit
Payer: Medicare Other | Attending: Student in an Organized Health Care Education/Training Program | Admitting: Student in an Organized Health Care Education/Training Program

## 2023-11-17 ENCOUNTER — Telehealth: Payer: Self-pay | Admitting: Student in an Organized Health Care Education/Training Program

## 2023-11-17 ENCOUNTER — Telehealth: Payer: Self-pay

## 2023-11-17 ENCOUNTER — Encounter: Payer: Self-pay | Admitting: Student in an Organized Health Care Education/Training Program

## 2023-11-17 VITALS — BP 176/98 | HR 88 | Temp 98.1°F | Resp 18 | Ht 69.0 in | Wt 142.0 lb

## 2023-11-17 DIAGNOSIS — G5690 Unspecified mononeuropathy of unspecified upper limb: Secondary | ICD-10-CM | POA: Diagnosis present

## 2023-11-17 DIAGNOSIS — G5681 Other specified mononeuropathies of right upper limb: Secondary | ICD-10-CM | POA: Insufficient documentation

## 2023-11-17 DIAGNOSIS — G894 Chronic pain syndrome: Secondary | ICD-10-CM | POA: Diagnosis present

## 2023-11-17 DIAGNOSIS — G8929 Other chronic pain: Secondary | ICD-10-CM | POA: Insufficient documentation

## 2023-11-17 DIAGNOSIS — M25511 Pain in right shoulder: Secondary | ICD-10-CM | POA: Insufficient documentation

## 2023-11-17 MED ORDER — DEXAMETHASONE SODIUM PHOSPHATE 10 MG/ML IJ SOLN
10.0000 mg | Freq: Once | INTRAMUSCULAR | Status: AC
  Administered 2023-11-17: 10 mg
  Filled 2023-11-17: qty 1

## 2023-11-17 MED ORDER — ROPIVACAINE HCL 2 MG/ML IJ SOLN
9.0000 mL | Freq: Once | INTRAMUSCULAR | Status: AC
  Administered 2023-11-17: 9 mL via PERINEURAL
  Filled 2023-11-17: qty 20

## 2023-11-17 MED ORDER — LIDOCAINE HCL 2 % IJ SOLN
20.0000 mL | Freq: Once | INTRAMUSCULAR | Status: AC
  Administered 2023-11-17: 100 mg
  Filled 2023-11-17: qty 40

## 2023-11-17 MED ORDER — IOHEXOL 180 MG/ML  SOLN
10.0000 mL | Freq: Once | INTRAMUSCULAR | Status: AC
  Administered 2023-11-17: 10 mL via INTRA_ARTICULAR
  Filled 2023-11-17: qty 20

## 2023-11-17 MED ORDER — DEXAMETHASONE SODIUM PHOSPHATE 10 MG/ML IJ SOLN
10.0000 mg | Freq: Once | INTRAMUSCULAR | Status: AC
Start: 2023-11-17 — End: 2023-11-17
  Administered 2023-11-17: 10 mg
  Filled 2023-11-17: qty 1

## 2023-11-17 NOTE — Patient Instructions (Signed)

## 2023-11-17 NOTE — Progress Notes (Signed)
 PROVIDER NOTE: Interpretation of information contained herein should be left to medically-trained personnel. Specific patient instructions are provided elsewhere under "Patient Instructions" section of medical record. This document was created in part using STT-dictation technology, any transcriptional errors that may result from this process are unintentional.  Patient: Grant Young Type: Established DOB: 11-Jan-1946 MRN: [id number] PCP: Lorre Munroe, NP  Service: Procedure DOS: 11/17/2023 Setting: Ambulatory Location: Ambulatory outpatient facility Delivery: Face-to-face Provider: Edward Jolly, MD Specialty: Interventional Pain Management Specialty designation: 09 Location: Outpatient facility Ref. Prov.: Lorre Munroe, NP       Interventional Therapy   Procedure: Suprascapular nerve block (SSNB) #1 and Axillary NB Laterality:  Right  Level: Superior to scapular spine, lateral to supraspinatus fossa (Suprascapular notch) for SSNB, inferior to to the acromion for the axillary nerve block Imaging: Fluoroscopic guidance         Anesthesia: Local anesthesia (1-2% Lidocaine) DOS: 11/17/2023  Performed by: Edward Jolly, MD  Purpose: Diagnostic/Therapeutic Indications: Shoulder pain, severe enough to impact quality of life and/or function. 1. Chronic right shoulder pain   2. Disorder of right suprascapular nerve   3. Neuropathy, axillary nerve   4. Chronic pain syndrome    NAS-11 score:   Pre-procedure: 7 /10   Post-procedure: 7 /10     Target: Suprascapular nerve Location: midway between the medial border of the scapula and the acromion as it runs through the suprascapular notch. Region: Suprascapular, posterior shoulder  Approach: Percutaneous  Neuroanatomy: The suprascapular nerve is the lateral branch of the superior trunk of the brachial plexus. It receives nerve fibers that originate in the nerve roots C5 and C6 (and sometimes C4). It is a mixed nerve, meaning that it  provides both sensory and motor supply for the suprascapular region. Function: The main function of this nerve is to provide motor innervation for two muscles, the supraspinatus and infraspinatus muscles. They are part of the rotator cuff muscles. In addition, the suprascapular nerve provides a sensory supply to the joints of the scapula (glenohumeral and acromioclavicular joints). Rationale (medical necessity): procedure needed and proper for the diagnosis and/or treatment of the patient's medical symptoms and needs.  Position / Prep / Materials:  Position: Prone Materials:  Tray: Block Needle(s):  Type: Spinal  Gauge (G): 22  Length: 3.5 in.  Qty: 1 Prep solution: ChloraPrep (2% chlorhexidine gluconate and 70% isopropyl alcohol) Prep Area: Entire posterior shoulder area. From upper spine to shoulder proper (upper arm), and from lateral neck to lower tip of shoulder blade.   H&P (Pre-op Assessment):  [name] is a 78 y.o. (year old), male patient, seen today for interventional treatment. He  has a past surgical history that includes Kidney stone surgery (09/14/1980); Kidney stone surgery (09/15/1995); Lithotripsy (09/14/2014); Lithotripsy (09/14/1996); Lithotripsy (09/14/1997); Gallbladder surgery (09/15/2015); Sigmoidoscopy (09/14/2017); Colonoscopy (09/15/2015); Cholecystectomy (2017); Shoulder surgery (Right, 1984); Back surgery; Spinal cord stimulator implant; Pain pump implantation; Pain pump removal; Tonsillectomy; Foot surgery (Bilateral, 03/18/2020); Foot surgery (08/032021); Upper gi endoscopy; Colonoscopy with propofol (N/A, 10/03/2020); Esophagogastroduodenoscopy (egd) with propofol (N/A, 10/03/2020); Cataract extraction w/PHACO (Left, 01/14/2021); Cataract extraction w/PHACO (Right, 01/28/2021); Coronary/Graft Acute MI Revascularization (N/A, 11/27/2021); LEFT HEART CATH AND CORONARY ANGIOGRAPHY (N/A, 11/27/2021); Excision basal cell carcinoma; transthoracic echocardiogram (11/28/2021);  Video bronchoscopy with endobronchial ultrasound (N/A, 01/15/2023); Esophagogastroduodenoscopy (egd) with propofol (N/A, 07/27/2023); and biopsy (07/27/2023). [name] has a current medication list which includes the following prescription(s): AMBULATORY NON FORMULARY MEDICATION, amitriptyline, anastrozole, aspirin ec, butalbital-apap-caffeine, cholecalciferol, cholestyramine, vitamin b-12, dhea, diazepam, dicyclomine,  digestive aids mixture, escitalopram, ezetimibe-simvastatin, hydralazine, ipratropium, ketoconazole, loperamide hcl, magnesium bisglycinate, melatonin, miebo, glutaloemine, NONFORMULARY OR COMPOUNDED ITEM, nutritional supplements, potassium chloride, potassium, prevident 5000 dry mouth, probiotic product, prochlorperazine, pyridostigmine, rabeprazole, sucralfate, testosterone cypionate, valacyclovir, propranolol, and propranolol er. His primarily concern today is the Shoulder Pain (right)  Initial Vital Signs:  Pulse/HCG Rate: 88ECG Heart Rate: 81 Temp: 98.1 F (36.7 C) Resp: 18 BP: (!) 156/76 SpO2: 100 %  BMI: Estimated body mass index is 20.97 kg/m as calculated from the following:   Height as of this encounter: 5\' 9"  (1.753 m).   Weight as of this encounter: 142 lb (64.4 kg).  Risk Assessment: Allergies: Reviewed. He is allergic to ace inhibitors, fluoxetine, metoclopramide, nalbuphine, other, amoxicillin-pot clavulanate, doxazosin, duloxetine, penicillins, tamsulosin, trazodone and nefazodone, amlodipine, cinoxacin, ciprofloxacin, fludrocortisone, nebivolol, olanzapine, olmesartan, pregabalin, prostaglandins, thyroid hormones, zolpidem, duloxetine hcl, fluoxetine hcl, nucynta [tapentadol], and phenytoin.  Allergy Precautions: None required Coagulopathies: Reviewed. None identified.  Blood-thinner therapy: None at this time Active Infection(s): Reviewed. None identified. [name] is afebrile  Site Confirmation: [name] was asked to confirm the procedure and laterality before  marking the site Procedure checklist: Completed Consent: Before the procedure and under the influence of no sedative(s), amnesic(s), or anxiolytics, the patient was informed of the treatment options, risks and possible complications. To fulfill our ethical and legal obligations, as recommended by the American Medical Association's Code of Ethics, I have informed the patient of my clinical impression; the nature and purpose of the treatment or procedure; the risks, benefits, and possible complications of the intervention; the alternatives, including doing nothing; the risk(s) and benefit(s) of the alternative treatment(s) or procedure(s); and the risk(s) and benefit(s) of doing nothing. The patient was provided information about the general risks and possible complications associated with the procedure. These may include, but are not limited to: failure to achieve desired goals, infection, bleeding, organ or nerve damage, allergic reactions, paralysis, and death. In addition, the patient was informed of those risks and complications associated to the procedure, such as failure to decrease pain; infection; bleeding; organ or nerve damage with subsequent damage to sensory, motor, and/or autonomic systems, resulting in permanent pain, numbness, and/or weakness of one or several areas of the body; allergic reactions; (i.e.: anaphylactic reaction); and/or death. Furthermore, the patient was informed of those risks and complications associated with the medications. These include, but are not limited to: allergic reactions (i.e.: anaphylactic or anaphylactoid reaction(s)); adrenal axis suppression; blood sugar elevation that in diabetics may result in ketoacidosis or comma; water retention that in patients with history of congestive heart failure may result in shortness of breath, pulmonary edema, and decompensation with resultant heart failure; weight gain; swelling or edema; medication-induced neural toxicity;  particulate matter embolism and blood vessel occlusion with resultant organ, and/or nervous system infarction; and/or aseptic necrosis of one or more joints. Finally, the patient was informed that Medicine is not an exact science; therefore, there is also the possibility of unforeseen or unpredictable risks and/or possible complications that may result in a catastrophic outcome. The patient indicated having understood very clearly. We have given the patient no guarantees and we have made no promises. Enough time was given to the patient to ask questions, all of which were answered to the patient's satisfaction. Mr. Escandon has indicated that he wanted to continue with the procedure. Attestation: I, the ordering provider, attest that I have discussed with the patient the benefits, risks, side-effects, alternatives, likelihood of achieving goals, and potential problems during  recovery for the procedure that I have provided informed consent. Date  Time: 11/17/2023 10:53 AM  Pre-Procedure Preparation:  Monitoring: As per clinic protocol. Respiration, ETCO2, SpO2, BP, heart rate and rhythm monitor placed and checked for adequate function Safety Precautions: Patient was assessed for positional comfort and pressure points before starting the procedure. Time-out: I initiated and conducted the "Time-out" before starting the procedure, as per protocol. The patient was asked to participate by confirming the accuracy of the "Time Out" information. Verification of the correct person, site, and procedure were performed and confirmed by me, the nursing staff, and the patient. "Time-out" conducted as per Joint Commission's Universal Protocol (UP.01.01.01). Time: 1150 Start Time: 1150 hrs.  Description of Procedure:          Procedural Technique Safety Precautions: Aspiration looking for blood return was conducted prior to all injections. At no point did we inject any substances, as a needle was being advanced. No attempts  were made at seeking any paresthesias. Safe injection practices and needle disposal techniques used. Medications properly checked for expiration dates. SDV (single dose vial) medications used. Description of the Procedure: Protocol guidelines were followed. The patient was placed in position over the procedure table. The target area was identified and the area prepped in the usual manner. Skin & deeper tissues infiltrated with local anesthetic. Appropriate amount of time allowed to pass for local anesthetics to take effect. The procedure needles were then advanced to the target area. Proper needle placement secured. Negative aspiration confirmed. Solution injected in intermittent fashion, asking for systemic symptoms every 0.5cc of injectate. The needles were then removed and the area cleansed, making sure to leave some of the prepping solution back to take advantage of its long term bactericidal properties.  5 cc solution made of 4cc of 0.2% ropivacaine, 1 cc of Decadron 10 mg/cc. Injected for right SSN      For the axillary nerve block, approximately 3 finger widths below the acromion of the right shoulder, within the middle third of the deltoid muscle needle was advanced to the humeral shaft and then pulled back 1 cm.  5 cc solution made of 4cc of 0.2% ropivacaine, 1 cc of Decadron 10 mg/cc was injected for the right axillary nerve  Vitals:   11/17/23 1108 11/17/23 1149 11/17/23 1154 11/17/23 1157  BP: (!) 156/76 (!) 175/96 (!) 173/76 (!) 176/98  Pulse: 88     Resp:  18 17 18   Temp: 98.1 F (36.7 C)     SpO2: 100% 99% 99% 99%  Weight: 142 lb (64.4 kg)     Height: 5\' 9"  (1.753 m)        Start Time: 1150 hrs. End Time: 1157 hrs.  Imaging Guidance (Spinal):          Type of Imaging Technique: Fluoroscopy Guidance (Spinal) Indication(s): Fluoroscopy guidance for needle placement to enhance accuracy in procedures requiring precise needle localization for targeted delivery of medication in  or near specific anatomical locations not easily accessible without such real-time imaging assistance. Exposure Time: Please see nurses notes. Contrast: Before injecting any contrast, we confirmed that the patient did not have an allergy to iodine, shellfish, or radiological contrast. Once satisfactory needle placement was completed at the desired level, radiological contrast was injected. Contrast injected under live fluoroscopy. No contrast complications. See chart for type and volume of contrast used. Fluoroscopic Guidance: I was personally present during the use of fluoroscopy. "Tunnel Vision Technique" used to obtain the best possible view of the  target area. Parallax error corrected before commencing the procedure. "Direction-depth-direction" technique used to introduce the needle under continuous pulsed fluoroscopy. Once target was reached, antero-posterior, oblique, and lateral fluoroscopic projection used confirm needle placement in all planes. Images permanently stored in EMR. Interpretation: I personally interpreted the imaging intraoperatively. Adequate needle placement confirmed in multiple planes. Appropriate spread of contrast into desired area was observed. No evidence of afferent or efferent intravascular uptake. No intrathecal or subarachnoid spread observed. Permanent images saved into the patient's record.  Antibiotic Prophylaxis:   Anti-infectives (From admission, onward)    None      Indication(s): None identified  Post-operative Assessment:  Post-procedure Vital Signs:  Pulse/HCG Rate: 8880 Temp: 98.1 F (36.7 C) Resp: 18 BP: (!) 176/98 SpO2: 99 %  EBL: None  Complications: No immediate post-treatment complications observed by team, or reported by patient.  Note: The patient tolerated the entire procedure well. A repeat set of vitals were taken after the procedure and the patient was kept under observation following institutional policy, for this type of procedure.  Post-procedural neurological assessment was performed, showing return to baseline, prior to discharge. The patient was provided with post-procedure discharge instructions, including a section on how to identify potential problems. Should any problems arise concerning this procedure, the patient was given instructions to immediately contact us, at any time, without hesitation. In any case, we plan to contact the patient by telephone for a follow-up status report regarding this interventional procedure.  Comments:  No additional relevant information.  Plan of Care (POC)  Orders:  Orders Placed This Encounter  Procedures   DG PAIN CLINIC C-ARM 1-60 MIN NO REPORT    Intraoperative interpretation by procedural physician at [facility].    Standing Status:   Standing    Number of Occurrences:   1    Reason for exam::   Assistance in needle guidance and placement for procedures requiring needle placement in or near specific anatomical locations not easily accessible without such assistance.   Ambulatory referral to Physical Therapy    Referral Priority:   Routine    Referral Type:   Physical Medicine    Referral Reason:   Specialty Services Required    Requested Specialty:   Physical Therapy    Number of Visits Requested:   1   Chronic Opioid Analgesic:  Hydrocodone IR 10 mg BID-TID prn    Medications ordered for procedure: Meds ordered this encounter  Medications   iohexol (OMNIPAQUE) 180 MG/ML injection 10 mL    Must be Myelogram-compatible. If not available, you may substitute with a water-soluble, non-ionic, hypoallergenic, myelogram-compatible radiological contrast medium.   lidocaine (XYLOCAINE) 2 % (with pres) injection 400 mg   dexamethasone (DECADRON) injection 10 mg   dexamethasone (DECADRON) injection 10 mg   ropivacaine (PF) 2 mg/mL (0.2%) (NAROPIN) injection 9 mL   Medications administered: We administered iohexol, lidocaine, dexamethasone, dexamethasone, and  ropivacaine (PF) 2 mg/mL (0.2%).  See the medical record for exact dosing, route, and time of administration.  Follow-up plan:   Return in about 6 weeks (around 12/29/2023), or [id number] ppe.      Recent Visits Date Type Provider Dept  11/02/23 Office Visit Edward Jolly, MD [facility]  Showing recent visits within past 90 days and meeting all other requirements Today's Visits Date Type Provider Dept  11/17/23 Procedure visit Edward Jolly, MD [facility]  Showing today's visits and meeting all other requirements Future Appointments Date Type Provider Dept  11/18/23 Appointment  Edward Jolly, MD [facility]  12/29/23 Appointment Edward Jolly, MD [facility]  Showing future appointments within next 90 days and meeting all other requirements  Disposition: Discharge home  Discharge (Date  Time): 11/17/2023; 1205 hrs.   Primary Care Physician: Lorre Munroe, NP Location: [facility] Note by: Edward Jolly, MD (TTS technology used. I apologize for any typographical errors that were not detected and corrected.) Date: 11/17/2023; Time: 12:31 PM  Disclaimer:  Medicine is not an Visual merchandiser. The only guarantee in medicine is that nothing is guaranteed. It is important to note that the decision to proceed with this intervention was based on the information collected from the patient. The Data and conclusions were drawn from the patient's questionnaire, the interview, and the physical examination. Because the information was provided in large part by the patient, it cannot be guaranteed that it has not been purposely or unconsciously manipulated. Every effort has been made to obtain as much relevant data as possible for this evaluation. It is important to note that the conclusions that lead to this procedure are derived in large part from the available data. Always take into account that the treatment will also be dependent on  availability of resources and existing treatment guidelines, considered by other Pain Management Practitioners as being common knowledge and practice, at the time of the intervention. For Medico-Legal purposes, it is also important to point out that variation in procedural techniques and pharmacological choices are the acceptable norm. The indications, contraindications, technique, and results of the above procedure should only be interpreted and judged by a Board-Certified Interventional Pain Specialist with extensive familiarity and expertise in the same exact procedure and technique.

## 2023-11-17 NOTE — Telephone Encounter (Signed)
 I am checking on this appt time.  Patient reports that he does need the appt as it is a workmens comp appt, these appts have to be a separate thing from his normal care.

## 2023-11-17 NOTE — Telephone Encounter (Signed)
 Attempt to contact patient to ask nurse questions for telephone appointment with Dr. Cherylann Ratel tomorrow. LVM.

## 2023-11-17 NOTE — Telephone Encounter (Signed)
 Please give patient a call, patient stated that he was returning a call from the nurse. TY

## 2023-11-17 NOTE — Progress Notes (Signed)
 Safety precautions to be maintained throughout the outpatient stay will include: orient to surroundings, keep bed in low position, maintain call bell within reach at all times, provide assistance with transfer out of bed and ambulation.

## 2023-11-18 ENCOUNTER — Ambulatory Visit: Payer: Worker's Compensation | Admitting: Student in an Organized Health Care Education/Training Program

## 2023-11-18 ENCOUNTER — Ambulatory Visit: Payer: Medicare Other | Admitting: Student in an Organized Health Care Education/Training Program

## 2023-11-18 ENCOUNTER — Ambulatory Visit
Payer: Worker's Compensation | Attending: Student in an Organized Health Care Education/Training Program | Admitting: Student in an Organized Health Care Education/Training Program

## 2023-11-18 ENCOUNTER — Telehealth: Payer: Self-pay

## 2023-11-18 DIAGNOSIS — M47816 Spondylosis without myelopathy or radiculopathy, lumbar region: Secondary | ICD-10-CM | POA: Diagnosis not present

## 2023-11-18 DIAGNOSIS — G894 Chronic pain syndrome: Secondary | ICD-10-CM | POA: Diagnosis not present

## 2023-11-18 NOTE — Progress Notes (Signed)
 Patient: Grant Young  Service Category: E/M  Provider: Edward Jolly, MD  DOB: 17-Aug-1946  DOS: 11/18/2023  Location: Office  MRN: [id number]  Setting: Ambulatory outpatient  Referring Provider: Lorre Munroe, NP  Type: Established Patient  Specialty: Interventional Pain Management  PCP: Lorre Munroe, NP  Location: Remote location  Delivery: TeleHealth     Virtual Encounter - Pain Management PROVIDER NOTE: Information contained herein reflects review and annotations entered in association with encounter. Interpretation of such information and data should be left to medically-trained personnel. Information provided to patient can be located elsewhere in the medical record under "Patient Instructions". Document created using STT-dictation technology, any transcriptional errors that may result from process are unintentional.    Contact & Pharmacy Preferred: [phone] Home: [phone] (home) Mobile: [phone] (mobile) E-mail: [email or web address]  [id number] - [place], [place] - [address] AT [facility] OF [address] [facility] [address] [place] [address] Phone: [phone] Fax: [phone]  [id number] [place] [facility] - [address] [place] [address] Phone: [phone] Fax: [phone]  [facility] - [place], [place] - [address] [place] [address] Phone: [phone] Fax: [phone]   Pre-screening  Grant Young offered "in-person" vs "virtual" encounter. He indicated preferring virtual for this encounter.   Reason COVID-19*  Social distancing based on CDC and AMA recommendations.   I contacted Grant Young on 11/18/2023 via telephone.      I clearly identified myself as Edward Jolly, MD. I verified that I was speaking with the correct person using two identifiers (Name: Grant Young, and date of birth: [birth year]).  Consent I sought verbal advanced  consent from Grant Young for virtual visit interactions. I informed Grant Young of possible security and privacy concerns, risks, and limitations associated with providing "not-in-person" medical evaluation and management services. I also informed Grant Young of the availability of "in-person" appointments. Finally, I informed him that there would be a charge for the virtual visit and that he could be  personally, fully or partially, financially responsible for it. Grant Young expressed understanding and agreed to proceed.   Historic Elements   Grant Young is a 78 y.o. year old, male patient evaluated today after our last contact on 11/17/2023. Grant Young  has a past medical history of Anemia, Aneurysm of ascending aorta (HCC), Anxiety, Aortic atherosclerosis (HCC), Chronic back pain, Coronary artery disease, Depression, Erectile dysfunction, GERD (gastroesophageal reflux disease), h/o Lyme disease, Headache, Hiatal hernia, History of bilateral cataract extraction (01/2021), History of gastritis, History of kidney stones, History of neuropathy, Hyperlipidemia LDL goal <70, Hypertension, Insomnia, Left lower lobe pulmonary nodule, Long term current use of antithrombotics/antiplatelets, Long term current use of aromatase inhibitor, Lumbar radicular pain, Myocardial infarction [facility]), NSTEMI (non-ST elevated myocardial infarction) (HCC) (11/27/2021), Overactive bladder, Pneumonia, PONV (postoperative nausea and vomiting), Recurrent herpes labialis, Skin cancer, basal cell, Spinal cord stimulator status, Squamous cell skin cancer, and Substance abuse (HCC). He also  has a past surgical history that includes Kidney stone surgery (09/14/1980); Kidney stone surgery (09/15/1995); Lithotripsy (09/14/2014); Lithotripsy (09/14/1996); Lithotripsy (09/14/1997); Gallbladder surgery (09/15/2015); Sigmoidoscopy (09/14/2017); Colonoscopy (09/15/2015); Cholecystectomy (2017); Shoulder surgery (Right, 1984); Back surgery; Spinal cord  stimulator implant; Pain pump implantation; Pain pump removal; Tonsillectomy; Foot surgery (Bilateral, 03/18/2020); Foot surgery (08/032021); Upper gi endoscopy; Colonoscopy with propofol (N/A, 10/03/2020); Esophagogastroduodenoscopy (egd) with propofol (N/A, 10/03/2020); Cataract extraction w/PHACO (Left, 01/14/2021); Cataract extraction w/PHACO (Right,  01/28/2021); Coronary/Graft Acute MI Revascularization (N/A, 11/27/2021); LEFT HEART CATH AND CORONARY ANGIOGRAPHY (N/A, 11/27/2021); Excision basal cell carcinoma; transthoracic echocardiogram (11/28/2021); Video bronchoscopy with endobronchial ultrasound (N/A, 01/15/2023); Esophagogastroduodenoscopy (egd) with propofol (N/A, 07/27/2023); and biopsy (07/27/2023). Grant Young has a current medication list which includes the following prescription(s): AMBULATORY NON FORMULARY MEDICATION, amitriptyline, anastrozole, aspirin ec, butalbital-apap-caffeine, cholecalciferol, cholestyramine, vitamin b-12, dhea, diazepam, dicyclomine, digestive aids mixture, escitalopram, ezetimibe-simvastatin, hydralazine, ipratropium, ketoconazole, loperamide hcl, magnesium bisglycinate, melatonin, miebo, glutaloemine, NONFORMULARY OR COMPOUNDED ITEM, nutritional supplements, potassium chloride, potassium, prevident 5000 dry mouth, probiotic product, prochlorperazine, pyridostigmine, rabeprazole, sucralfate, testosterone cypionate, valacyclovir, propranolol, and propranolol er. He  reports that he has never smoked. He has never been exposed to tobacco smoke. He has never used smokeless tobacco. He reports current alcohol use. He reports that he does not currently use drugs. Grant Young is allergic to ace inhibitors, fluoxetine, metoclopramide, nalbuphine, other, amoxicillin-pot clavulanate, doxazosin, duloxetine, penicillins, tamsulosin, trazodone and nefazodone, amlodipine, cinoxacin, ciprofloxacin, fludrocortisone, nebivolol, olanzapine, olmesartan, pregabalin, prostaglandins, thyroid hormones,  zolpidem, duloxetine hcl, fluoxetine hcl, nucynta [tapentadol], and phenytoin.  BMI: Estimated body mass index is 20.97 kg/m as calculated from the following:   Height as of 11/17/23: 5\' 9"  (1.753 m).   Weight as of 11/17/23: 142 lb (64.4 kg). Last encounter: 11/02/2023. Last procedure: 11/17/2023.  HPI  Today, he is being contacted for worsening of previously known (established) problem  -Lumbar RFA was not very helpful, states that he got better relief with his lumbar medial branch nerve blocks which he wants to repeat for low back pain related to lumbar facet arthropathy   Laboratory Chemistry Profile   Renal Lab Results  Component Value Date   BUN 11 07/06/2023   CREATININE 0.84 07/06/2023   BCR 13 07/06/2023   GFRAA 101 05/20/2020   GFRNONAA >60 04/20/2023    Hepatic Lab Results  Component Value Date   AST 18 10/15/2023   ALT 11 10/15/2023   ALBUMIN 4.1 10/15/2023   ALKPHOS 126 (H) 10/15/2023   AMYLASE 32 09/18/2022   LIPASE 37 04/20/2023    Electrolytes Lab Results  Component Value Date   NA 145 (H) 07/06/2023   K 4.1 07/06/2023   CL 105 07/06/2023   CALCIUM 9.4 07/06/2023   MG 2.4 03/25/2023   PHOS 3.6 11/30/2021    Bone Lab Results  Component Value Date   VD25OH 55.7 07/06/2023   TESTOSTERONE 33.2 05/20/2020    Inflammation (CRP: Acute Phase) (ESR: Chronic Phase) Lab Results  Component Value Date   CRP 1 07/06/2023   ESRSEDRATE 2 07/06/2023   LATICACIDVEN 1.4 08/18/2022         Note: Above Lab results reviewed.   Assessment  The primary encounter diagnosis was Lumbar spondylosis. Diagnoses of Lumbar facet arthropathy and Chronic pain syndrome were also pertinent to this visit.  Plan of Care  1. Lumbar spondylosis (Primary) - LUMBAR FACET(MEDIAL BRANCH NERVE BLOCK) MBNB; Future  2. Lumbar facet arthropathy - LUMBAR FACET(MEDIAL BRANCH NERVE BLOCK) MBNB; Future  3. Chronic pain syndrome - LUMBAR FACET(MEDIAL BRANCH NERVE BLOCK) MBNB;  Future    Orders:  Orders Placed This Encounter  Procedures   LUMBAR FACET(MEDIAL BRANCH NERVE BLOCK) MBNB    Standing Status:   Future    Expected Date:   12/27/2023    Expiration Date:   02/18/2024    Scheduling Instructions:     Procedure: Lumbar facet block (AKA.: Lumbosacral medial branch nerve block)     Side: Bilateral  Level: L2-3, L3-4, L4-5, Facets (L2, L3, L4, L5, Medial Branch)     Sedation: without     Timeframe: ASAA    Where will this procedure be performed?:   [facility]   Follow-up plan:   No follow-ups on file.      Status post Botox No. 1 on 01/01/2020 for migraine, lumbar TPI as well. SPG block 03/13/20. Bilateral GONB 04/01/20. 11/13/20 B/L L3,4,5 Facet blocks helpful repeat prn                 Recent Visits Date Type Provider Dept  11/17/23 Procedure visit Edward Jolly, MD [facility]  11/02/23 Office Visit Edward Jolly, MD [facility]  Showing recent visits within past 90 days and meeting all other requirements Today's Visits Date Type Provider Dept  11/18/23 Office Visit Edward Jolly, MD [facility]  Showing today's visits and meeting all other requirements Future Appointments Date Type Provider Dept  12/29/23 Appointment Edward Jolly, MD [facility]  Showing future appointments within next 90 days and meeting all other requirements  I discussed the assessment and treatment plan with the patient. The patient was provided an opportunity to ask questions and all were answered. The patient agreed with the plan and demonstrated an understanding of the instructions.  Patient advised to call back or seek an in-person evaluation if the symptoms or condition worsens.  Duration of encounter: .  Note by: Edward Jolly, MD Date: 11/18/2023; Time: 2:41 PM

## 2023-11-18 NOTE — Telephone Encounter (Signed)
 No issues post-procedure.

## 2023-11-29 ENCOUNTER — Telehealth: Payer: Self-pay

## 2023-11-29 NOTE — Telephone Encounter (Signed)
 Worker comp denied the MBB. They are saying because he has had 2 and did the RFA which didn't help, another MBB is not medically necessary. I copied their decision and pasted below. It went up to level 2 on the board.   Denial Category Medical Reasons  Level 2 Denial Reason Question of medical necessity for repeat MBB. The purpose of MBB is to confirm the utility and necessity of more aggressive treatments such as RFA. Claimant has had RFA with limited improvement. Additional MBB are not indicated per the MTG.  Level 1 Per medicals attached claimant had 2 successful L2-5 lumbar MBB's and then proceeded w/ lumbar RFA. Treating provider states Lumbar RFA was not very helpful, states that he got better relief with his lumbar medial branch nerve blocks which he wants to repeat for low back pain related to lumbar facet arthropathy. However, per the MTG [id number].ii: The primary goal of a diagnostic medial nerve branch block in the setting of non-acute pain is to determine the need for more definitive treatment (i.e., radiofrequency ablation). Denied as Proof of Burden not met.

## 2023-12-02 ENCOUNTER — Encounter: Payer: Self-pay | Admitting: Internal Medicine

## 2023-12-02 MED ORDER — DIAZEPAM 5 MG PO TABS: ORAL_TABLET | ORAL | 0 refills | Status: DC

## 2023-12-02 MED ORDER — PYRIDOSTIGMINE BROMIDE ER 180 MG PO TBCR: 180.0000 mg | EXTENDED_RELEASE_TABLET | Freq: Three times a day (TID) | ORAL | 0 refills | Status: DC

## 2023-12-17 ENCOUNTER — Encounter: Payer: Self-pay | Admitting: Internal Medicine

## 2023-12-20 ENCOUNTER — Other Ambulatory Visit: Payer: Self-pay | Admitting: Internal Medicine

## 2023-12-20 MED ORDER — TIZANIDINE HCL 2 MG PO CAPS
2.0000 mg | ORAL_CAPSULE | Freq: Two times a day (BID) | ORAL | Status: DC | PRN
Start: 2023-12-20 — End: 2024-04-28

## 2023-12-24 ENCOUNTER — Ambulatory Visit: Payer: Medicare Other

## 2023-12-24 VITALS — Ht 69.0 in | Wt 145.0 lb

## 2023-12-24 DIAGNOSIS — Z0000 Encounter for general adult medical examination without abnormal findings: Secondary | ICD-10-CM

## 2023-12-24 DIAGNOSIS — Z1211 Encounter for screening for malignant neoplasm of colon: Secondary | ICD-10-CM

## 2023-12-24 NOTE — Progress Notes (Signed)
 Subjective:   Grant Young is a 78 y.o. who presents for a Medicare Wellness preventive visit.  Visit Complete: Virtual I connected with  Grant Young on 12/24/23 by a audio enabled telemedicine application and verified that I am speaking with the correct person using two identifiers.  Patient Location: Home  Provider Location: Home Office  I discussed the limitations of evaluation and management by telemedicine. The patient expressed understanding and agreed to proceed.  Vital Signs: Because this visit was a virtual/telehealth visit, some criteria may be missing or patient reported. Any vitals not documented were not able to be obtained and vitals that have been documented are patient reported.  VideoDeclined- This patient declined Librarian, academic. Therefore the visit was completed with audio only.  Persons Participating in Visit: Patient.  AWV Questionnaire: Yes: Patient Medicare AWV questionnaire was completed by the patient on 12/20/23; I have confirmed that all information answered by patient is correct and no changes since this date.        Objective:    Today's Vitals   12/24/23 1410  Weight: 145 lb (65.8 kg)  Height: 5\' 9"  (1.753 m)   Body mass index is 21.41 kg/m.     12/24/2023    2:19 PM 11/17/2023   11:11 AM 11/02/2023   11:22 AM 07/27/2023    7:29 AM 06/30/2023   10:01 AM 04/20/2023    3:00 PM 04/15/2023    1:17 PM  Advanced Directives  Does Patient Have a Medical Advance Directive? No Yes Yes Yes Yes Yes Yes  Type of Furniture conservator/restorer;Living will  Healthcare Power of [place];Living will Healthcare Power of [place];Living will    Does patient want to make changes to medical advance directive?     No - Patient declined    Copy of Healthcare Power of Attorney in Chart?    Yes - validated most recent copy scanned in chart (See row information)     Would patient like information on creating a medical  advance directive? Yes (MAU/Ambulatory/Procedural Areas - Information given)          Current Medications (verified) Outpatient Encounter Medications as of 12/24/2023  Medication Sig   amitriptyline (ELAVIL) 10 MG tablet Take 3 tablets (30 mg total) by mouth at bedtime.   anastrozole (ARIMIDEX) 1 MG tablet 1/2 tablet (0.5 mg) by mouth once weekly   aspirin EC 81 MG tablet Take 1 tablet (81 mg total) by mouth daily. Swallow whole.   Butalbital-APAP-Caffeine 50-300-40 MG CAPS Take 1 capsule by mouth every 6 (six) hours as needed.   Cholecalciferol 50 MCG (2000 UT) CAPS Take 1 tablet by mouth daily.   cholestyramine (QUESTRAN) 4 g packet TAKE 1 PACKET (4 G TOTAL) BY MOUTH 3 (THREE) TIMES DAILY.   Cyanocobalamin (VITAMIN B-12) 5000 MCG LOZG Take 1 lozenge by mouth daily.    DHEA 25 MG CAPS Take 25 mg by mouth daily.   diazepam (VALIUM) 5 MG tablet TAKE 1 TABLET BY MOUTH EVERY MORNING AND 2 TABLETS EVERY NIGHT AT BEDTIME   dicyclomine (BENTYL) 10 MG capsule TAKE 1 CAPSULE (10 MG TOTAL) BY MOUTH 4 TIMES A DAY BEFORE MEALS AND AT BEDTIME   Digestive Aids Mixture (DIGESTION GB PO) Take 1 capsule by mouth daily.   escitalopram (LEXAPRO) 20 MG tablet Take 1 tablet (20 mg total) by mouth daily.   ezetimibe-simvastatin (VYTORIN) 10-20 MG tablet Take 1 tablet by mouth daily.   hydrALAZINE (APRESOLINE)  25 MG tablet Take 1 tablet (25 mg total) by mouth 2 (two) times daily. In the morning and at dinner, may take extra tablet for BP greater than 160   ipratropium (ATROVENT) 0.03 % nasal spray Place 2 sprays into both nostrils every 12 (twelve) hours.   ketoconazole (NIZORAL) 2 % shampoo Apply 1 Application topically once.   Loperamide HCl (IMODIUM PO) Take by mouth as needed.    MAGNESIUM BISGLYCINATE PO Take by mouth daily.   melatonin 5 MG TABS Take 5 mg by mouth at bedtime.   MIEBO 1.338 GM/ML SOLN Place 1 drop into both eyes 4 (four) times daily.   Misc Natural Products (GLUTALOEMINE) POWD Take 1 Scoop  by mouth daily.   NONFORMULARY OR COMPOUNDED ITEM Bi-Mix Papaverine 30mg , Phentolamine 1mg    Dosage: Inject 0.25cc-0.5cc as need for ED   Vial 1ml   Qty #5 Refills 6   [facility] [phone] Fax [phone]   Nutritional Supplements (NUTRITIONAL SUPPLEMENT PO) Take 1 capsule by mouth daily. [name]   potassium chloride (KLOR-CON) 10 MEQ tablet Take 10 mEq by mouth daily.   POTASSIUM PO Take 1 tablet by mouth as directed. Takes 5x weekly.   PREVIDENT 5000 DRY MOUTH 1.1 % GEL dental gel SMARTSIG:To Teeth 3 Times Daily   Probiotic Product (PROBIOTIC DAILY PO) Take by mouth daily.   prochlorperazine (COMPAZINE) 10 MG tablet Take 1 tablet (10 mg total) by mouth 2 (two) times daily as needed for nausea or vomiting.   pyridostigmine (MESTINON) 180 MG CR tablet Take 1 tablet (180 mg total) by mouth 3 (three) times daily.   RABEprazole (ACIPHEX) 20 MG tablet Take 2 tablets (40 mg total) by mouth 2 (two) times daily.   sucralfate (CARAFATE) 1 g tablet Take 1 tablet (1 g total) by mouth 4 (four) times daily.   testosterone cypionate (DEPOTESTOSTERONE CYPIONATE) 200 MG/ML injection Inject 80 mg into the muscle every 14 (fourteen) days.   tizanidine (ZANAFLEX) 2 MG capsule Take 1 capsule (2 mg total) by mouth every 12 (twelve) hours as needed for muscle spasms.   valACYclovir (VALTREX) 1000 MG tablet Take 2 tabs p.o. and repeat in 12 hours as needed for cold sore   [DISCONTINUED] AMBULATORY NON FORMULARY MEDICATION Apply 1 patch topically 3 (three) times a week. Medication Name: iron with vit C patch   [DISCONTINUED] propranolol (INDERAL) 10 MG tablet Take 1 tablet (10 mg total) by mouth 2 (two) times daily as needed for up to 720 doses. (Patient not taking: Reported on 11/17/2023)   [DISCONTINUED] propranolol ER (INDERAL LA) 60 MG 24 hr capsule Take 1 capsule (60 mg total) by mouth 2 (two) times daily. At [place] and Bedtime (Patient not taking: Reported on 11/02/2023)   No  facility-administered encounter medications on file as of 12/24/2023.    Allergies (verified) Ace inhibitors, Fluoxetine, Metoclopramide, Nalbuphine, Other, Amoxicillin-pot clavulanate, Doxazosin, Duloxetine, Penicillins, Tamsulosin, Trazodone and nefazodone, Amlodipine, Cinoxacin, Ciprofloxacin, Fludrocortisone, Nebivolol, Olanzapine, Olmesartan, Pregabalin, Prostaglandins, Thyroid hormones, Zolpidem, Duloxetine hcl, Fluoxetine hcl, Nucynta [tapentadol], and Phenytoin   History: Past Medical History:  Diagnosis Date   Anemia    Aneurysm of ascending aorta (HCC)    a.) CT chest 08/18/2022: 4.1 cm; b.) CT chest 12/07/2022: 4.2 cm   Anxiety    a.) on BZO (diazepam) PRN   Aortic atherosclerosis (HCC)    Chronic back pain    Coronary artery disease    a.) s/p PCI with DES x 2 (pLCx and o-mLAD) 11/27/2021  Depression    Erectile dysfunction    a.) Bi-mix (papaverine + phentolamine) injections + exogenous testosterone injections   GERD (gastroesophageal reflux disease)    h/o Lyme disease    Headache    Hiatal hernia    History of bilateral cataract extraction 01/2021   History of gastritis    History of kidney stones    History of neuropathy    Hyperlipidemia LDL goal <70    a.) CAD with NSTEMI -->  intolerant of statins with statin myopathy and memory issues.;  Also intolerant of Repatha   Hypertension    Labile blood pressures but dizziness with blood pressures in the "normal range"; intolerant of most medications including ARB's, ACE inhibitor's, amlodipine most beta-blockers other than propranolol.   Insomnia    a.) takes malatonin PRN   Left lower lobe pulmonary nodule    a.) chest CT 12/07/2022: nodules x 2 posteromedial LLL;  8 x 11 mm and 8 x 10 mm   Long term current use of antithrombotics/antiplatelets    a.) DAPT (ASA + clopidogrel)   Long term current use of aromatase inhibitor    a,) anastrozole --> estridiol suppression secondary to exogenous testosterone use    Lumbar radicular pain    Myocardial infarction (HCC)    a.) MI x 2 - 1990 * 1999 - PTCA   NSTEMI (non-ST elevated myocardial infarction) (HCC) 11/27/2021   a.) LHC 11/28/2021: sequential 75% o-pLAD, 70% OM2, 80% p-mLAD, 99% mLAD, 100% pLCx --> PCI placing a 2.5 x 26 mm Onyx Frontier DES to pLCx and a 2.5 x 24 mm Onyx Frontier DES to the o-m LAD (covering 3 lesions)   Overactive bladder    Pneumonia    PONV (postoperative nausea and vomiting)    Recurrent herpes labialis    a.) has suppressive valacyclovir to use PRN   Skin cancer, basal cell    Spinal cord stimulator status    01/08/21 - not currently using.   Squamous cell skin cancer    Substance abuse [facility])    Past Surgical History:  Procedure Laterality Date   BACK SURGERY     BASAL CELL CARCINOMA EXCISION     BIOPSY  07/27/2023   Procedure: BIOPSY;  Surgeon: Wyline Mood, MD;  Location: [facility] ENDOSCOPY;  Service: Gastroenterology;;   CATARACT EXTRACTION W/PHACO Left 01/14/2021   Procedure: CATARACT EXTRACTION PHACO AND INTRAOCULAR LENS PLACEMENT (IOC) LEFT VIVITY TORIC LENS 8.75 00:56.7;  Surgeon: Galen Manila, MD;  Location: [facility];  Service: Ophthalmology;  Laterality: Left;   CATARACT EXTRACTION W/PHACO Right 01/28/2021   Procedure: CATARACT EXTRACTION PHACO AND INTRAOCULAR LENS PLACEMENT (IOC) RIGHT VIVITY TORIC LENS;  Surgeon: Galen Manila, MD;  Location: [facility];  Service: Ophthalmology;  Laterality: Right;  6.54 00:46.4   CHOLECYSTECTOMY  2017   COLONOSCOPY  09/15/2015   Per New Patient Packet   COLONOSCOPY WITH PROPOFOL N/A 10/03/2020   Procedure: COLONOSCOPY WITH PROPOFOL;  Surgeon: Wyline Mood, MD;  Location: [facility] ENDOSCOPY;  Service: Gastroenterology;  Laterality: N/A;   CORONARY/GRAFT ACUTE MI REVASCULARIZATION N/A 11/27/2021   Procedure: Coronary/Graft Acute MI Revascularization;  Surgeon: Marykay Lex, MD;  [facility] CATH: (NSTEMI) - > 100% prox-mid LCx (Onyx Frontier DES  2.5 x 26 -> 2.7 mm, Ost  OM1 65%.  Ost-mid LAD 3 lesions 75%, 90%, 99% => DES PCI Onyx Frontier DES 2.5 x 34 -> 2.8 mm   ESOPHAGOGASTRODUODENOSCOPY (EGD) WITH PROPOFOL N/A 10/03/2020   Procedure: ESOPHAGOGASTRODUODENOSCOPY (EGD) WITH PROPOFOL;  Surgeon: Wyline Mood, MD;  Location: [facility] END[facility]COPY;  Service: Gastroenterology;  Laterality: N/A;   ESOPHAGOGASTRODUODENOSCOPY (EGD) WITH PROPOFOL N/A 07/27/2023   Procedure: ESOPHAGOGASTRODUODENOSCOPY (EGD) WITH PROPOFOL;  Surgeon: Wyline Mood, MD;  Location: [facility] ENDOSCOPY;  Service: Gastroenterology;  Laterality: N/A;   FOOT SURGERY Bilateral 03/18/2020   FOOT SURGERY  08/032021   GALLBLADDER SURGERY  09/15/2015   Gallbladder Removal. Procedure done by Dr.Beverly. Per New Patient Packet   KIDNEY STONE SURGERY  09/14/1980   Too many to count. Per New Patient Packet 09/14/1980-09/15/1995   KIDNEY STONE SURGERY  09/15/1995   Too many to count. Per New Patient Packet   LEFT HEART CATH AND CORONARY ANGIOGRAPHY N/A 11/27/2021   Procedure: LEFT HEART CATH AND CORONARY ANGIOGRAPHY;  Surgeon: Marykay Lex, MD;  Location: [facility] CATH:  NSTEMI - 2 V CAD: 100% thrombotic prox-mid LCx (DES PCI), ost OM1 65%; Ost LAD 75% - prox LAD 90%, prox-mid 99% (DES PCI).  MIld diffuse RCA disease - calcicifed.  R-L collaterals filling LCx. EF ~45-50% with lateral HK. LVEDP 28 mmHg   LITHOTRIPSY  09/14/2014   Per New Patient Packet   LITHOTRIPSY  09/14/1996   No Stints Used. Per New Patient Packet   LITHOTRIPSY  09/14/1997   No Stints used. Per New Patient Packet   PAIN PUMP IMPLANTATION     PAIN PUMP REMOVAL     SHOULDER SURGERY Right 1984   SIGMOIDOSCOPY  09/14/2017   Per New Patient Packet   SPINAL CORD STIMULATOR IMPLANT     TONSILLECTOMY     TRANSTHORACIC ECHOCARDIOGRAM  11/28/2021   (in setting of non-STEMI): EF 50 to 55%.  Suspect inferior and lateral hypokinesis.  Indeterminate diastolic parameters.  Normal RV size and function.  Normal RAP.  Normal aortic  and mitral valves with only mild MR and AI.  Normal RVP and RAP.   UPPER GI ENDOSCOPY     VIDEO BRONCHOSCOPY WITH ENDOBRONCHIAL ULTRASOUND N/A 01/15/2023   Procedure: VIDEO BRONCHOSCOPY WITH ENDOBRONCHIAL ULTRASOUND;  Surgeon: Vida Rigger, MD;  Location: [facility] ORS;  Service: Thoracic;  Laterality: N/A;   Family History  Problem Relation Age of Onset   Heart disease Father    Heart failure Father    Hypertension Father    Stroke Father    Stroke Mother    Dementia Mother    Kidney Stones Daughter    Anxiety disorder Daughter    OCD Daughter    Social History   Socioeconomic History   Marital status: Married    Spouse name: Therapist, art   Number of children: Not on file   Years of education: Not on file   Highest education level: Master's degree (e.g., MA, MS, MEng, MEd, MSW, MBA)  Occupational History   Not on file  Tobacco Use   Smoking status: Never    Passive exposure: Never   Smokeless tobacco: Never  Vaping Use   Vaping status: Never Used  Substance and Sexual Activity   Alcohol use: Yes    Comment: 1 Drink a Month, socially   Drug use: Not Currently   Sexual activity: Yes    Birth control/protection: None  Other Topics Concern   Not on file  Social History Narrative   Tobacco use, amount per day now: None   Past tobacco use, amount per day: None   How many years did you use tobacco: 0   Alcohol use (drinks per week): 0-1 Month   Diet:   Do you drink/eat things with caffeine:  Occasionally ( Hot Chocolate and maybe 1/4 of 16oz Pepsi 2-3 times a week.   Marital status: Married                                  What year were you married? 1970   Do you live in a house, apartment, assisted living, condo, trailer, etc.? House   Is it one or more stories? One   How many persons live in your home? 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 Yes                                    Type and how often? [place], Recumbent Bike, 3 times a week. Try to get a 1.2 mile walk at least 3 times a week or more.     Do you have a living will? Yes   Do you have a DNR form?  No                                 If not, do you want to discuss one?   Do you have signed POA/HPOA forms? Yes                       If so, please bring to you appointment    Do you have any difficulty bathing or dressing yourself? No    Do you have difficulty preparing food or eating? No   Do you have difficulty managing your medications? No   Do you have any difficulty managing your finances? No   Do you have any difficulty affording your medications? No         Social Drivers of Corporate investment banker Strain: Low Risk  (12/24/2023)   Overall Financial Resource Strain (CARDIA)    Difficulty of Paying Living Expenses: Not hard at all  Food Insecurity: No Food Insecurity (12/24/2023)   Hunger Vital Sign    Worried About Running Out of Food in the Last Year: Never true    Ran Out of Food in the Last Year: Never true  Transportation Needs: No Transportation Needs (12/24/2023)   PRAPARE - Administrator, Civil Service (Medical): No    Lack of Transportation (Non-Medical): No  Physical Activity: Insufficiently Active (12/24/2023)   Exercise Vital Sign    Days of Exercise per Week: 3 days    Minutes of Exercise per Session: 30 min  Stress: No Stress Concern Present (12/24/2023)   [employer] of Occupational Health - Occupational Stress Questionnaire    Feeling of Stress : Not at all  Social Connections: Moderately Integrated (12/24/2023)   Social Connection and Isolation Panel [NHANES]    Frequency of Communication with Friends and Family: Twice a week    Frequency of Social Gatherings with Friends and Family: Once a week    Attends Religious Services: 1 to 4 times per year    Active Member of [employer] or  Organizations: No    Attends Banker Meetings: Never    Marital Status: Married  Tobacco Counseling Counseling given: Not Answered    Clinical Intake:  Pre-visit preparation completed: Yes  Pain : No/denies pain  Diabetes: No  Lab Results  Component Value Date   HGBA1C 5.0 07/06/2023   HGBA1C 5.2 03/25/2023   HGBA1C 5.1 10/17/2019     How often do you need to have someone help you when you read instructions, pamphlets, or other written materials from your doctor or pharmacy?: 1 - Never  Interpreter Needed?: No  Information entered by :: Kandis Fantasia LPN   Activities of Daily Living     12/20/2023    6:26 PM 03/31/2023   10:59 AM  In your present state of health, do you have any difficulty performing the following activities:  Hearing? 0 0  Vision? 0 0  Difficulty concentrating or making decisions? 0 0  Walking or climbing stairs? 0 1  Dressing or bathing? 0 0  Doing errands, shopping? 0 1  Preparing Food and eating ? N   Using the Toilet? N   In the past six months, have you accidently leaked urine? N   Do you have problems with loss of bowel control? N   Managing your Medications? N   Managing your Finances? N   Housekeeping or managing your Housekeeping? N     Patient Care Team: Lorre Munroe, NP as PCP - General (Internal Medicine) Marykay Lex, MD as PCP - Cardiology (Cardiology) Mhoon, Jill Alexanders, MD (Psychiatry) Dermatology, Kerman Passey, MD as Consulting Physician (Gastroenterology) Ronney Asters, Jackelyn Poling, RPH-CPP (Pharmacist)  Indicate any recent Medical Services you may have received from other than [facility] providers in the past year (date may be approximate).     Assessment:   This is a routine wellness examination for Grant Young.  Hearing/Vision screen Hearing Screening - Comments:: Denies hearing difficulties   Vision Screening - Comments:: Wears rx glasses - up to date with routine eye exams with [facility]     Goals Addressed             This Visit's Progress    DIET - EAT MORE FRUITS AND VEGETABLES   On track      Depression Screen     12/24/2023    2:17 PM 11/02/2023   11:23 AM 06/30/2023   10:02 AM 03/31/2023   10:58 AM 02/23/2023    3:27 PM 02/02/2023    1:40 PM 12/03/2022    2:05 PM  PHQ 2/9 Scores  PHQ - 2 Score 2 2 0 1 1 0 0  PHQ- 9 Score 2      0    Fall Risk     12/20/2023    6:26 PM 11/17/2023   11:11 AM 11/02/2023   11:22 AM 06/30/2023   10:01 AM 06/21/2023    2:45 PM  Fall Risk   Falls in the past year? 1 0 1 1 0  Comment     not since procedure  Number falls in past yr: 0  1 0 0  Comment   pt states falls r/t dizziness from high blood pressure    Injury with Fall? 0  0 0 0  Risk for fall due to : History of fall(s)      Follow up Falls evaluation completed;Education provided;Falls prevention discussed        MEDICARE RISK AT HOME:  Medicare Risk at Home Any stairs in or around the home?: (Patient-Rptd) Yes If so, are there any without handrails?: (Patient-Rptd) No Home free of loose  throw rugs in walkways, pet beds, electrical cords, etc?: (Patient-Rptd) Yes Adequate lighting in your home to reduce risk of falls?: (Patient-Rptd) Yes Life alert?: (Patient-Rptd) No Use of a cane, walker or w/c?: (Patient-Rptd) No Grab bars in the bathroom?: (Patient-Rptd) Yes Shower chair or bench in shower?: (Patient-Rptd) Yes Elevated toilet seat or a handicapped toilet?: (Patient-Rptd) No  TIMED UP AND GO:  Was the test performed?  No  Cognitive Function: 6CIT completed        12/24/2023    2:19 PM 12/03/2022    2:13 PM 12/19/2019    2:20 PM  6CIT Screen  What Year? 0 points 0 points 0 points  What month? 0 points 0 points 0 points  What time? 0 points 0 points 0 points  Count back from 20 0 points 0 points 0 points  Months in reverse 0 points 0 points 2 points  Repeat phrase 0 points 2 points 2 points  Total Score 0 points 2 points 4 points     Immunizations Immunization History  Administered Date(s) Administered   Influenza Nasal 07/31/2004, 05/21/2010, 07/06/2014, 07/03/2015   Influenza-Unspecified 09/15/2015, 09/14/2016, 06/15/2018   Moderna Sars-Covid-2 Vaccination 10/02/2019, 10/26/2019, 07/30/2020   Pneumococcal Conjugate-13 09/16/2005, 12/20/2013   Pneumococcal Polysaccharide-23 02/16/2017   Tdap 03/02/2005, 07/30/2020   Zoster Recombinant(Shingrix) 03/14/2017, 06/14/2018   Zoster, Live 09/15/2015    Screening Tests Health Maintenance  Topic Date Due   COVID-19 Vaccine (4 - 2024-25 season) 05/16/2023   Colonoscopy  10/04/2023   INFLUENZA VACCINE  04/14/2024   Medicare Annual Wellness (AWV)  12/23/2024   DTaP/Tdap/Td (3 - Td or Tdap) 07/30/2030   Pneumonia Vaccine 34+ Years old  Completed   Hepatitis C Screening  Completed   Zoster Vaccines- Shingrix  Completed   HPV VACCINES  Aged Out   Meningococcal B Vaccine  Aged Out    Health Maintenance  Health Maintenance Due  Topic Date Due   COVID-19 Vaccine (4 - 2024-25 season) 05/16/2023   Colonoscopy  10/04/2023   Health Maintenance Items Addressed: Referral sent to GI for colonoscopy  Additional Screening:  Vision Screening: Recommended annual ophthalmology exams for early detection of glaucoma and other disorders of the eye.  Dental Screening: Recommended annual dental exams for proper oral hygiene  Community Resource Referral / Chronic Care Management: CRR required this visit?  No   CCM required this visit?  No     Plan:     I have personally reviewed and noted the following in the patient's chart:   Medical and social history Use of alcohol, tobacco or illicit drugs  Current medications and supplements including opioid prescriptions. Patient is currently taking opioid prescriptions. Information provided to patient regarding non-opioid alternatives. Patient advised to discuss non-opioid treatment plan with their provider. Functional  ability and status Nutritional status Physical activity Advanced directives List of other physicians Hospitalizations, surgeries, and ER visits in previous 12 months Vitals Screenings to include cognitive, depression, and falls Referrals and appointments  In addition, I have reviewed and discussed with patient certain preventive protocols, quality metrics, and best practice recommendations. A written personalized care plan for preventive services as well as general preventive health recommendations were provided to patient.     Kandis Fantasia [place], [place]   1/61/0960   After Visit Summary: (MyChart) Due to this being a telephonic visit, the after visit summary with patients personalized plan was offered to patient via MyChart   Notes: Nothing significant to report at this time.

## 2023-12-24 NOTE — Patient Instructions (Signed)
 Grant Young , Thank you for taking time to come for your Medicare Wellness Visit. I appreciate your ongoing commitment to your health goals. Please review the following plan we discussed and let me know if I can assist you in the future.   Referrals/Orders/Follow-Ups/Clinician Recommendations: Aim for 30 minutes of exercise or brisk walking, 6-8 glasses of water, and 5 servings of fruits and vegetables each day.  This is a list of the screening recommended for you and due dates:  Health Maintenance  Topic Date Due   COVID-19 Vaccine (4 - 2024-25 season) 05/16/2023   Colon Cancer Screening  10/04/2023   Flu Shot  04/14/2024   Medicare Annual Wellness Visit  12/23/2024   DTaP/Tdap/Td vaccine (3 - Td or Tdap) 07/30/2030   Pneumonia Vaccine  Completed   Hepatitis C Screening  Completed   Zoster (Shingles) Vaccine  Completed   HPV Vaccine  Aged Out   Meningitis B Vaccine  Aged Out    Advanced directives: (ACP Link)Information on Advanced Care Planning can be found at [facility] Advance Health Care Directives Advance Health Care Directives. [email or web address]   Next Medicare Annual Wellness Visit scheduled for next year: Yes

## 2023-12-26 NOTE — Progress Notes (Unsigned)
 Cardiology Clinic Note   Date: 12/30/2023 ID: Grant Young, Grant Young 02/25/1946, MRN [id number]  Primary Cardiologist:  Randene Bustard, MD  Chief Complaint   Grant Young is a 78 y.o. male who presents to the clinic today for routine follow up.   Patient Profile   Grant Young is followed by Dr. Addie Holstein for the history outlined below.      Past medical history significant for: CAD. 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 LHC 11/28/2021 (NSTEMI): Proximal to mid LCx 100%.  OM2 70%.  Ostial to proximal LAD 75%.  Proximal to mid LAD 80%.  Mid LAD 99%.  Mildly reduced LVEF with lateral hypokinesis.  PCI with DES 2.5 x 26 mm to proximal LCx, DES 2.5 x 34 mm to proximal LAD. Echo 03/27/2023: EF 50 to 55%.  No RWMA.  Mild LVH.  Indeterminate diastolic parameters.  Normal RV size/function.  Normal PA pressure.  Severe LAE.  Trivial MR.  Mild AI. Hypertension. Hyperlipidemia. Lipid panel 10/15/2023: LDL 50, HDL 43, TG 68, total 108. GERD. Migraines. CVA.  In summary, patient has a history of CAD with MI x 2 in 1990 and 1999 with PTCA in [place] .  In March 2023 he was admitted to the hospital for with a high risk NSTEMI and underwent PCI to LAD and LCx as detailed above.  Echo demonstrated EF 50 to 55%, select images concerning for inferior lateral wall hypokinesis, indeterminate diastolic parameters, normal RV size/function, normal PA pressure, mild MR.  Posthospitalization he noted significant fatigue that he related to statin therapy.  He was evaluated in the lipid clinic and initiated on Repatha.  He was seen by Dr. Addie Holstein in April 2024 and transition to Plavix monotherapy.  He complains of issues with dizziness and orthostasis as well as fatigue and exercise intolerance.  Hydralazine daily dosing was discontinued and he was instructed to use for SBP > 160 mmHg.  At the time of his April visit he was weaning off of Norco for chronic pain.  Propranolol was decreased to 40 mg in the a.m. and 80  mg in the p.m.  He was evaluated by the lipid clinic in July 2024 and noted Repatha was contributing to his chronic back pain.  He was switched to Praluent.  In August 2024 he was seen by Dr. Addie Holstein for BP concerns.  Home BP log demonstrated labile blood pressure with readings as high as 192/94.  He had been taking hydralazine nearly daily for elevated BP.  He noted frequent headaches and had self discontinued Praluent.  He was encouraged to restart Praluent.  Propranolol was adjusted to 60 mg twice daily and hydralazine to 25 mg twice daily with an extra dose for SBP > 160 mmHg.  Patient was last seen in the office by Palmer Bobo, NP on 07/13/2023 for follow-up of hypertension.  Home BP log demonstrated SBP consistently 130-140.  He reported transitioning from Effexor (known to increase BP) to Lexapro with improvement in BP readings.  He was working on increasing his physical activity at the time of his visit.     History of Present Illness    Today, patient is accompanied by his wife. He is undergoing testing for Parkinson's through [facility] neurology. He has started on Sinemet with a slow titration but he had to slowly titrate off propranolol because they cannot be taken together. He was mainly taking propranolol for headaches. He is experiencing headaches but was told this is a  side effect of the sinemet. He is otherwise doing well from a cardiac standpoint. Patient denies shortness of breath, dyspnea on exertion, lower extremity edema, orthopnea or PND. No chest pain, pressure, or tightness. No palpitations. He likes to do water aerobics for exercise. However, he has noticed lower extremity cramping that wakes him from sleep particularly after he is active. Patient has been keeping a BP log. His BP is consistently <130/80 in the afternoons. He wonders if he should take hydralazine in the afternoon regularly.     ROS: All other systems reviewed and are otherwise negative except as noted in  History of Present Illness.  EKGs/Labs Reviewed    EKG Interpretation Date/Time:  Thursday December 30 2023 11:08:54 EDT Ventricular Rate:  80 PR Interval:  168 QRS Duration:  124 QT Interval:  408 QTC Calculation: 470 R Axis:   -67  Text Interpretation: Sinus rhythm with occasional Premature ventricular complexes Right bundle branch block When compared with ECG of 20-Apr-2023 15:04, RBBB now present Confirmed by Morey Ar [phone]) on 12/30/2023 11:24:07 AM   07/06/2023: BUN 11; Creatinine, Ser 0.84; Potassium 4.1; Sodium 145 10/15/2023: ALT 11; AST 18   07/06/2023: Hemoglobin 14.8; WBC 6.2   07/06/2023: TSH 2.710    Physical Exam    VS:  BP 134/80 (BP Location: Left Arm)   Pulse 80   Ht 5\' 9"  (1.753 m)   Wt 143 lb (64.9 kg)   SpO2 97%   BMI 21.12 kg/m  , BMI Body mass index is 21.12 kg/m.  GEN: Well nourished, well developed, in no acute distress. Neck: No JVD or carotid bruits. Cardiac:  RRR. No murmurs. No rubs or gallops.   Respiratory:  Respirations regular and unlabored. Clear to auscultation without rales, wheezing or rhonchi. GI: Soft, nontender, nondistended. Extremities: Radials/DP/PT 2+ and equal bilaterally. No clubbing or cyanosis. No edema.   Skin: Warm and dry, no rash. Neuro: Strength intact.  Assessment & Plan   CAD MI x 2 in 1990 and 1999 with PTCA performed in [place] .  PCI with DES to proximal LCx and proximal LAD in the setting of NSTEMI March 2023.  Patient denies chest pain, pressure or tightness. He likes to do water aerobics but finds he experiences leg cramps at night when he does. Suggested trying seated exercising as a more low impact options.  -Continue aspirin, Vytorin.  Hypertension BP today 134/80. Home BP log reflects elevated BP consistently in the afternoon. He is experiencing headaches but believes this is a side effect of sinemet.  -Increase hydralazine to 25 mg three times a day.   Hyperlipidemia LDL 50 January 2025, at  goal. -Continue Vytorin.  Disposition: Increase hydralazine 25 mg to 3 times a day. Return in 6 months or sooner as needed.          Signed, Lonell Rives. Giuliano Preece, DNP, NP-C

## 2023-12-27 MED ORDER — PYRIDOSTIGMINE BROMIDE 60 MG PO TABS: 60.0000 mg | ORAL_TABLET | Freq: Two times a day (BID) | ORAL | 0 refills | Status: DC

## 2023-12-28 ENCOUNTER — Other Ambulatory Visit: Payer: Self-pay

## 2023-12-28 ENCOUNTER — Telehealth: Payer: Self-pay

## 2023-12-28 DIAGNOSIS — Z1211 Encounter for screening for malignant neoplasm of colon: Secondary | ICD-10-CM

## 2023-12-28 MED ORDER — NA SULFATE-K SULFATE-MG SULF 17.5-3.13-1.6 GM/177ML PO SOLN: 1.0000 | Freq: Once | ORAL | 0 refills | Status: AC

## 2023-12-28 NOTE — Telephone Encounter (Signed)
 Patient has requested to have a repeat EGD to his scheduled colonoscopy (01/26/24).  He stated that after his previously performed EGD (07/27/23) that he adjusted his medications as you advised and started to feel better after a few weeks but now he has started experiencing "burning in his stomach" similar to what he experienced prior to having the last colonoscopy.    He has requested to have EGD to make sure that nothing new has transpired and to give him peace of mind.  Please advise on adding EGD to patient's colonoscopy procedure.  Thanks,  [place], [place]

## 2023-12-28 NOTE — Telephone Encounter (Signed)
 Gastroenterology Pre-Procedure Review  Request Date: 01/26/24 Requesting Physician: Dr. Antony Baumgartner  PATIENT REVIEW QUESTIONS: The patient responded to the following health history questions as indicated:    1. Are you having any GI issues? yes (neurologist recently started on Snemet and he has been experiencing some stomach discomfort) 2. Do you have a personal history of Polyps? no last colonoscopy was 10/03/20 no polyps were noted.  Dr. Antony Baumgartner noted colon normal and results letter dated 10/08/20 noted "Colonoscopy for colon cancer screening purposes are usually not performed after age 57. Any decision to perform one over the age of 45, would be after discussion with the physician on a case to case basis to assess the risk versus benefits".  3. Do you have a family history of Colon Cancer or Polyps? no 4. Diabetes Mellitus? no 5. Joint replacements in the past 12 months?no 6. Major health problems in the past 3 months?no 7. Any artificial heart valves, MVP, or defibrillator?no    MEDICATIONS & ALLERGIES:    Patient reports the following regarding taking any anticoagulation/antiplatelet therapy:   Plavix, Coumadin, Eliquis, Xarelto, Lovenox, Pradaxa, Brilinta, or Effient? no Aspirin? no  Patient confirms/reports the following medications:  Current Outpatient Medications  Medication Sig Dispense Refill   amitriptyline (ELAVIL) 10 MG tablet Take 3 tablets (30 mg total) by mouth at bedtime. 270 tablet 3   anastrozole (ARIMIDEX) 1 MG tablet 1/2 tablet (0.5 mg) by mouth once weekly     aspirin EC 81 MG tablet Take 1 tablet (81 mg total) by mouth daily. Swallow whole.     Butalbital-APAP-Caffeine 50-300-40 MG CAPS Take 1 capsule by mouth every 6 (six) hours as needed. 90 capsule 0   Cholecalciferol 50 MCG (2000 UT) CAPS Take 1 tablet by mouth daily.     cholestyramine (QUESTRAN) 4 g packet TAKE 1 PACKET (4 G TOTAL) BY MOUTH 3 (THREE) TIMES DAILY.     Cyanocobalamin (VITAMIN B-12) 5000 MCG LOZG Take 1  lozenge by mouth daily.      DHEA 25 MG CAPS Take 25 mg by mouth daily.     diazepam (VALIUM) 5 MG tablet TAKE 1 TABLET BY MOUTH EVERY MORNING AND 2 TABLETS EVERY NIGHT AT BEDTIME 90 tablet 0   dicyclomine (BENTYL) 10 MG capsule TAKE 1 CAPSULE (10 MG TOTAL) BY MOUTH 4 TIMES A DAY BEFORE MEALS AND AT BEDTIME 90 capsule 1   Digestive Aids Mixture (DIGESTION GB PO) Take 1 capsule by mouth daily.     escitalopram (LEXAPRO) 20 MG tablet Take 1 tablet (20 mg total) by mouth daily. 90 tablet 1   ezetimibe-simvastatin (VYTORIN) 10-20 MG tablet Take 1 tablet by mouth daily. 90 tablet 3   hydrALAZINE (APRESOLINE) 25 MG tablet Take 1 tablet (25 mg total) by mouth 2 (two) times daily. In the morning and at dinner, may take extra tablet for BP greater than 160 180 tablet 3   ipratropium (ATROVENT) 0.03 % nasal spray Place 2 sprays into both nostrils every 12 (twelve) hours. 30 mL 12   ketoconazole (NIZORAL) 2 % shampoo Apply 1 Application topically once.     Loperamide HCl (IMODIUM PO) Take by mouth as needed.      MAGNESIUM BISGLYCINATE PO Take by mouth daily.     melatonin 5 MG TABS Take 5 mg by mouth at bedtime.     MIEBO 1.338 GM/ML SOLN Place 1 drop into both eyes 4 (four) times daily.     Misc Natural Products (GLUTALOEMINE) POWD Take 1 Scoop  by mouth daily.     NONFORMULARY OR COMPOUNDED ITEM Bi-Mix Papaverine 30mg , Phentolamine 1mg    Dosage: Inject 0.25cc-0.5cc as need for ED   Vial 1ml   Qty #5 Refills 6   [facility] [phone] Fax [phone] 5 each 6   Nutritional Supplements (NUTRITIONAL SUPPLEMENT PO) Take 1 capsule by mouth daily. [name]     potassium chloride (KLOR-CON) 10 MEQ tablet Take 10 mEq by mouth daily.     POTASSIUM PO Take 1 tablet by mouth as directed. Takes 5x weekly.     PREVIDENT 5000 DRY MOUTH 1.1 % GEL dental gel SMARTSIG:To Teeth 3 Times Daily     Probiotic Product (PROBIOTIC DAILY PO) Take by mouth daily.     prochlorperazine  (COMPAZINE) 10 MG tablet Take 1 tablet (10 mg total) by mouth 2 (two) times daily as needed for nausea or vomiting. 180 tablet 0   pyridostigmine (MESTINON) 60 MG tablet Take 1 tablet (60 mg total) by mouth 2 (two) times daily. 180 tablet 0   RABEprazole (ACIPHEX) 20 MG tablet Take 2 tablets (40 mg total) by mouth 2 (two) times daily. 360 tablet 3   sucralfate (CARAFATE) 1 g tablet Take 1 tablet (1 g total) by mouth 4 (four) times daily. 360 tablet 3   testosterone cypionate (DEPOTESTOSTERONE CYPIONATE) 200 MG/ML injection Inject 80 mg into the muscle every 14 (fourteen) days.     tizanidine (ZANAFLEX) 2 MG capsule Take 1 capsule (2 mg total) by mouth every 12 (twelve) hours as needed for muscle spasms.     valACYclovir (VALTREX) 1000 MG tablet Take 2 tabs p.o. and repeat in 12 hours as needed for cold sore 30 tablet 0   No current facility-administered medications for this visit.    Patient confirms/reports the following allergies:  Allergies  Allergen Reactions   Ace Inhibitors     Other reaction(s): Cough   Fluoxetine Anxiety    "made me fall asleep" per pt "bad headaches and "makes  Me  Crazy" historical allergy noted in [employer] "made me fall asleep" per pt "bad headaches and "makes  Me  Crazy" Per New Patient Packet.     Metoclopramide     Other reaction(s): Other (See Comments), Other (See Comments), Unknown Tardive Dyskinesia  historical allergy noted in [employer] Tardive Dyskinesia  Per New Patient Packet.   Nalbuphine     Used Post Back surgery- Anesthesiologist Error. Patient had Narcotic Withdraw. Per New Patient Packet.    Other     Other reaction(s): Other (See Comments) Altered mental status in combo with narcotics at previous hospitalization - Full Withdrawal Symptoms Other reaction(s): Rash   Amoxicillin-Pot Clavulanate Nausea Only    Per New Patient Packet.   Doxazosin Rash    Other reaction(s): Other - See Comments, Rash UNKNOWN REACTION UNKNOWN REACTION     Duloxetine Nausea Only    Per New Patient Packet.    Penicillins Nausea Only    Per New Patient Packet.   Tamsulosin Itching and Anxiety    Restless, Flushing, Heavy Chest, Itching, Hyperactive mood and Anxiety. Unable to handle side effects. Per New Patient Packet.     Trazodone And Nefazodone Itching, Anxiety and Rash    Headache. "INCREASED MY ANXIETY AND HEARTRATE" Flushing, tachycardia "INCREASED MY ANXIETY AND HEARTRATE" Per New Patient Packet.    Amlodipine     Shaking, unsure of reaction. Per New Patient Packet.     Cinoxacin     GI Intolerance, and Dizziness.  Per New Patient Packet.    Ciprofloxacin     Other reaction(s): Unknown   Fludrocortisone Other (See Comments)    "Worsening headaches, GI issues, fatigue"   Nebivolol     Other reaction(s): Unknown   Olanzapine     Headache and unable to sleep for 3 nights. Per New Patient Packet.    Olmesartan     Other reaction(s): Unknown   Pregabalin     Confusion, Lack of concentration, dizziness, and likely drowsiness. Per New Patient Packet.     Prostaglandins     Other reaction(s): Other (See Comments) Intolerance   Thyroid Hormones     Other reaction(s): Other (See Comments) Thyroid (Nature Thyroid) contraindicated with some of your other medications.   Zolpidem     Nightmares, Ineffective after 2 days. Per New Patient Packet.    Duloxetine Hcl     Other reaction(s): Rash   Fluoxetine Hcl     Other reaction(s): Rash   Nucynta [Tapentadol] Other (See Comments)    Vertigo    Phenytoin Anxiety    Hyperactivity, and Ineffective. Per New Patient Packet.     No orders of the defined types were placed in this encounter.   AUTHORIZATION INFORMATION Primary Insurance: 1D#: Group #:  Secondary Insurance: 1D#: Group #:  SCHEDULE INFORMATION: Date: 01/26/24 Time: Location: [facility]

## 2023-12-29 ENCOUNTER — Encounter: Payer: Self-pay | Admitting: Student in an Organized Health Care Education/Training Program

## 2023-12-29 ENCOUNTER — Ambulatory Visit
Attending: Student in an Organized Health Care Education/Training Program | Admitting: Student in an Organized Health Care Education/Training Program

## 2023-12-29 VITALS — BP 148/77 | HR 80 | Temp 99.3°F | Ht 69.0 in | Wt 145.0 lb

## 2023-12-29 DIAGNOSIS — M47816 Spondylosis without myelopathy or radiculopathy, lumbar region: Secondary | ICD-10-CM | POA: Diagnosis present

## 2023-12-29 DIAGNOSIS — G8929 Other chronic pain: Secondary | ICD-10-CM | POA: Insufficient documentation

## 2023-12-29 DIAGNOSIS — G894 Chronic pain syndrome: Secondary | ICD-10-CM | POA: Diagnosis present

## 2023-12-29 DIAGNOSIS — M25511 Pain in right shoulder: Secondary | ICD-10-CM | POA: Diagnosis present

## 2023-12-29 DIAGNOSIS — G5681 Other specified mononeuropathies of right upper limb: Secondary | ICD-10-CM | POA: Diagnosis present

## 2023-12-29 NOTE — Telephone Encounter (Signed)
 Grant Young, I sent him a virtual message letting him know that Dr. Antony Baumgartner doesn't think he needs an EGD. So can you please reach out to the patient and schedule his colonoscopy. Thank you so much!

## 2023-12-29 NOTE — Progress Notes (Signed)
 PROVIDER NOTE: Interpretation of information contained herein should be left to medically-trained personnel. Specific patient instructions are provided elsewhere under "Patient Instructions" section of medical record. This document was created in part using AI and STT-dictation technology, any transcriptional errors that may result from this process are unintentional.  Patient: Grant Young  Service: E/M   PCP: Grant Munroe, NP  DOB: 06-09-1946  DOS: 12/29/2023  Provider: Edward Jolly, MD  MRN: [id number]  Delivery: Face-to-face  Specialty: Interventional Pain Management  Type: Established Patient  Setting: Ambulatory outpatient facility  Specialty designation: 09  Referring Prov.: Grant Munroe, NP  Location: Outpatient office facility       HPI  [name], a 78 y.o. 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 [name] primary complain today is Shoulder Pain (right)  Pertinent problems: [name] has Lumbar spondylosis and Chronic pain syndrome on their pertinent problem list. Pain Assessment: Severity of Chronic pain is reported as a 4 /10. Location: Shoulder Right/pain radities at time. Onset: More than a month ago. Quality: Aching, Burning, Constant, Discomfort. Timing: Constant. Modifying factor(s): ice and heat. Vitals:  height is 5\' 9"  (1.753 m) and weight is 145 lb (65.8 kg). His temperature is 99.3 F (37.4 C). His blood pressure is 148/77 (abnormal) and his pulse is 80. His oxygen saturation is 100%.  BMI: Estimated body mass index is 21.41 kg/m as calculated from the following:   Height as of this encounter: 5\' 9"  (1.753 m).   Weight as of this encounter: 145 lb (65.8 kg). Last encounter: 11/18/2023. Last procedure: 11/17/2023.  Reason for encounter: post-procedure evaluation and assessment.   Post-procedure evaluation    Procedure: Suprascapular nerve block (SSNB) #1 and Axillary NB Laterality:  Right  Level: Superior  to scapular spine, lateral to supraspinatus fossa (Suprascapular notch) for SSNB, inferior to to the acromion for the axillary nerve block Imaging: Fluoroscopic guidance         Anesthesia: Local anesthesia (1-2% Lidocaine) DOS: 11/17/2023  Performed by: Edward Jolly, MD  Purpose: Diagnostic/Therapeutic Indications: Shoulder pain, severe enough to impact quality of life and/or function. 1. Chronic right shoulder pain   2. Disorder of right suprascapular nerve   3. Neuropathy, axillary nerve   4. Chronic pain syndrome    NAS-11 score:   Pre-procedure: 7 /10   Post-procedure: 7 /10    Effectiveness:  Initial hour after procedure: 50 %  Subsequent 4-6 hours post-procedure: 50 %  Analgesia past initial 6 hours: 0 %  Ongoing improvement:  Analgesic:  0%    Pharmacotherapy Assessment  Analgesic: Hydrocodone IR 10 mg BID-TID prn    Monitoring: [place] PMP: PDMP not reviewed this encounter.       Pharmacotherapy: No side-effects or adverse reactions reported. Compliance: No problems identified. Effectiveness: Clinically acceptable.  No notes on file  No results found for: "CBDTHCR" No results found for: "D8THCCBX" No results found for: "D9THCCBX"  UDS:  Summary  Date Value Ref Range Status  09/17/2020 Note  Final    Comment:    ==================================================================== Compliance Drug Analysis, Ur ==================================================================== Specimen Alert Note: Urinary creatinine is low; ability to detect some drugs may be compromised. Interpret results with caution. (Creatinine) ==================================================================== Test                             Result       Flag  Units  Drug Present and Declared for Prescription Verification   Lorazepam                      1107         EXPECTED   ng/mg creat    Source of lorazepam is a scheduled prescription medication.    Butalbital                      PRESENT      EXPECTED   Citalopram                     PRESENT      EXPECTED   Desmethylcitalopram            PRESENT      EXPECTED    Desmethylcitalopram is an expected metabolite of citalopram or the    enantiomeric form, escitalopram.    Acetaminophen                  PRESENT      EXPECTED   Propranolol                    PRESENT      EXPECTED  Drug Present not Declared for Prescription Verification   Oxazepam                       300          UNEXPECTED ng/mg creat   Temazepam                      357          UNEXPECTED ng/mg creat    Oxazepam and temazepam are expected metabolites of diazepam.    Oxazepam is also an expected metabolite of other benzodiazepine    drugs, including chlordiazepoxide, prazepam, clorazepate, halazepam,    and temazepam.  Oxazepam and temazepam are available as scheduled    prescription medications.  Drug Absent but Declared for Prescription Verification   Hydrocodone                    Not Detected UNEXPECTED ng/mg creat   Tizanidine                     Not Detected UNEXPECTED    Tizanidine, as indicated in the declared medication list, is not    always detected even when used as directed.    Amitriptyline                  Not Detected UNEXPECTED   Prochlorperazine               Not Detected UNEXPECTED   Salicylate                     Not Detected UNEXPECTED    Aspirin, as indicated in the declared medication list, is not always    detected even when used as directed.  ==================================================================== Test                      Result    Flag   Units      Ref Range   Creatinine              14        LL     mg/dL      >=  20 ==================================================================== Declared Medications:  The flagging and interpretation on this report are based on the  following declared medications.  Unexpected results may arise from  inaccuracies in the declared medications.   **Note: The  testing scope of this panel includes these medications:   Amitriptyline (Elavil)  Butalbital (Fioricet)  Butalbital (Fiorinal)  Escitalopram (Lexapro)  Hydrocodone (Norco)  Lorazepam (Ativan)  Prochlorperazine (Compazine)  Propranolol (Inderal)   **Note: The testing scope of this panel does not include small to  moderate amounts of these reported medications:   Acetaminophen (Fioricet)  Acetaminophen (Norco)  Aspirin (Fiorinal)  Tizanidine (Zanaflex)   **Note: The testing scope of this panel does not include the  following reported medications:   Anastrozole (Arimidex)  Caffeine (Fioricet)  Caffeine (Fiorinal)  Chlorthalidone (Hygroton)  Cholecalciferol  Cholestyramine (Questran)  Dicyclomine (Bentyl)  Ezetimibe (Zetia)  Hydrocortisone  Melatonin  Ondansetron (Zofran)  Potassium (Klor-Con)  Rabeprazole (Aciphex)  Sucralfate (Carafate)  Valacyclovir (Valtrex)  Vitamin B12 ==================================================================== For clinical consultation, please call [phone]. ====================================================================       ROS  Constitutional: Denies any fever or chills Gastrointestinal: No reported hemesis, hematochezia, vomiting, or acute GI distress Musculoskeletal:  Right shoulder pain Neurological: No reported episodes of acute onset apraxia, aphasia, dysarthria, agnosia, amnesia, paralysis, loss of coordination, or loss of consciousness  Medication Review  Butalbital-APAP-Caffeine, Cholecalciferol, DHEA, Digestive Aids Mixture, GlutAloeMine, Loperamide HCl, Magnesium Bisglycinate, NONFORMULARY OR COMPOUNDED ITEM, Nutritional Supplements, Perfluorohexyloctane, Potassium, Probiotic Product, RABEprazole, Vitamin B-12, amitriptyline, anastrozole, aspirin EC, cholestyramine, diazepam, dicyclomine, escitalopram, ezetimibe-simvastatin, hydrALAZINE, ipratropium, ketoconazole, melatonin, potassium chloride, prochlorperazine,  pyridostigmine, sodium fluoride, sucralfate, testosterone cypionate, tizanidine, and valACYclovir  History Review  Allergy: Mr. Dase is allergic to ace inhibitors, fluoxetine, metoclopramide, nalbuphine, other, amoxicillin-pot clavulanate, doxazosin, duloxetine, penicillins, tamsulosin, trazodone and nefazodone, amlodipine, cinoxacin, ciprofloxacin, fludrocortisone, nebivolol, olanzapine, olmesartan, pregabalin, prostaglandins, thyroid hormones, zolpidem, duloxetine hcl, fluoxetine hcl, nucynta [tapentadol], and phenytoin. Drug: Mr. Umholtz  reports that he does not currently use drugs. Alcohol:  reports current alcohol use. Tobacco:  reports that he has never smoked. He has never been exposed to tobacco smoke. He has never used smokeless tobacco. Social: Mr. Fohl  reports that he has never smoked. He has never been exposed to tobacco smoke. He has never used smokeless tobacco. He reports current alcohol use. He reports that he does not currently use drugs. Medical:  has a past medical history of Anemia, Aneurysm of ascending aorta (HCC), Anxiety, Aortic atherosclerosis (HCC), Chronic back pain, Coronary artery disease, Depression, Erectile dysfunction, GERD (gastroesophageal reflux disease), h/o Lyme disease, Headache, Hiatal hernia, History of bilateral cataract extraction (01/2021), History of gastritis, History of kidney stones, History of neuropathy, Hyperlipidemia LDL goal <70, Hypertension, Insomnia, Left lower lobe pulmonary nodule, Long term current use of antithrombotics/antiplatelets, Long term current use of aromatase inhibitor, Lumbar radicular pain, Myocardial infarction [facility]), NSTEMI (non-ST elevated myocardial infarction) (HCC) (11/27/2021), Overactive bladder, Pneumonia, PONV (postoperative nausea and vomiting), Recurrent herpes labialis, Skin cancer, basal cell, Spinal cord stimulator status, Squamous cell skin cancer, and Substance abuse (HCC). Surgical: Mr. Ring  has a past surgical history  that includes Kidney stone surgery (09/14/1980); Kidney stone surgery (09/15/1995); Lithotripsy (09/14/2014); Lithotripsy (09/14/1996); Lithotripsy (09/14/1997); Gallbladder surgery (09/15/2015); Sigmoidoscopy (09/14/2017); Colonoscopy (09/15/2015); Cholecystectomy (2017); Shoulder surgery (Right, 1984); Back surgery; Spinal cord stimulator implant; Pain pump implantation; Pain pump removal; Tonsillectomy; Foot surgery (Bilateral, 03/18/2020); Foot surgery (08/032021); Upper gi endoscopy; Colonoscopy with propofol (N/A, 10/03/2020); Esophagogastroduodenoscopy (egd) with propofol (N/A, 10/03/2020); Cataract extraction w/PHACO (Left, 01/14/2021); Cataract extraction w/PHACO (  Right, 01/28/2021); Coronary/Graft Acute MI Revascularization (N/A, 11/27/2021); LEFT HEART CATH AND CORONARY ANGIOGRAPHY (N/A, 11/27/2021); Excision basal cell carcinoma; transthoracic echocardiogram (11/28/2021); Video bronchoscopy with endobronchial ultrasound (N/A, 01/15/2023); Esophagogastroduodenoscopy (egd) with propofol (N/A, 07/27/2023); and biopsy (07/27/2023). Family: family history includes Anxiety disorder in his daughter; Dementia in his mother; Heart disease in his father; Heart failure in his father; Hypertension in his father; Kidney Stones in his daughter; OCD in his daughter; Stroke in his father and mother.  Laboratory Chemistry Profile   Renal Lab Results  Component Value Date   BUN 11 07/06/2023   CREATININE 0.84 07/06/2023   BCR 13 07/06/2023   GFRAA 101 05/20/2020   GFRNONAA >60 04/20/2023    Hepatic Lab Results  Component Value Date   AST 18 10/15/2023   ALT 11 10/15/2023   ALBUMIN 4.1 10/15/2023   ALKPHOS 126 (H) 10/15/2023   AMYLASE 32 09/18/2022   LIPASE 37 04/20/2023    Electrolytes Lab Results  Component Value Date   NA 145 (H) 07/06/2023   K 4.1 07/06/2023   CL 105 07/06/2023   CALCIUM 9.4 07/06/2023   MG 2.4 03/25/2023   PHOS 3.6 11/30/2021    Bone Lab Results  Component Value Date    VD25OH 55.7 07/06/2023   TESTOSTERONE 33.2 05/20/2020    Inflammation (CRP: Acute Phase) (ESR: Chronic Phase) Lab Results  Component Value Date   CRP 1 07/06/2023   ESRSEDRATE 2 07/06/2023   LATICACIDVEN 1.4 08/18/2022         Note: Above Lab results reviewed.    Physical Exam  General appearance: Well nourished, well developed, and well hydrated. In no apparent acute distress Mental status: Alert, oriented x 3 (person, place, & time)       Respiratory: No evidence of acute respiratory distress Eyes: PERLA Vitals: BP (!) 148/77   Pulse 80   Temp 99.3 F (37.4 C)   Ht 5\' 9"  (1.753 m)   Wt 145 lb (65.8 kg)   SpO2 100%   BMI 21.41 kg/m  BMI: Estimated body mass index is 21.41 kg/m as calculated from the following:   Height as of this encounter: 5\' 9"  (1.753 m).   Weight as of this encounter: 145 lb (65.8 kg). Ideal: Ideal body weight: 70.7 kg (155 lb 13.8 oz)  Right shoulder pain, limited range of motion  Assessment   Diagnosis  1. Disorder of right suprascapular nerve   2. Chronic right shoulder pain   3. Lumbar spondylosis   4. Lumbar facet arthropathy   5. Chronic pain syndrome      Updated Problems: No problems updated.  Plan of Care  Patient continues to struggle with right shoulder pain that is worse with shoulder abduction.  He is currently seeing orthopedics and he has a CT of his shoulder scheduled with an arthrogram.  I instructed him to continue care with them and diagnostic workup and see what surgical options are presented to him after his imaging studies by the orthopedic surgeon.    Follow-up plan:   Return for patient will call to schedule F2F appt prn.     Status post Botox No. 1 on 01/01/2020 for migraine, lumbar TPI as well. SPG block 03/13/20. Bilateral GONB 04/01/20. 11/13/20 B/L L3,4,5 Facet blocks helpful repeat prn                Recent Visits Date Type Provider Dept  11/18/23 Office Visit Edward Jolly, MD [facility]   11/17/23 Procedure visit Jamont Mellin,  Ike Malady, MD [facility]  11/02/23 Office Visit Cephus Collin, MD [facility]  Showing recent visits within past 90 days and meeting all other requirements Today's Visits Date Type Provider Dept  12/29/23 Office Visit Cephus Collin, MD [facility]  Showing today's visits and meeting all other requirements Future Appointments No visits were found meeting these conditions. Showing future appointments within next 90 days and meeting all other requirements  I discussed the assessment and treatment plan with the patient. The patient was provided an opportunity to ask questions and all were answered. The patient agreed with the plan and demonstrated an understanding of the instructions.  Patient advised to call back or seek an in-person evaluation if the symptoms or condition worsens.  Duration of encounter: .  Total time on encounter, as per AMA guidelines included both the face-to-face and non-face-to-face time personally spent by the physician and/or other qualified health care professional(s) on the day of the encounter (includes time in activities that require the physician or other qualified health care professional and does not include time in activities normally performed by clinical staff). Physician's time may include the following activities when performed: Preparing to see the patient (e.g., pre-charting review of records, searching for previously ordered imaging, lab work, and nerve conduction tests) Review of prior analgesic pharmacotherapies. Reviewing PMP Interpreting ordered tests (e.g., lab work, imaging, nerve conduction tests) Performing post-procedure evaluations, including interpretation of diagnostic procedures Obtaining and/or reviewing separately obtained history Performing a medically appropriate examination and/or evaluation Counseling and educating the patient/family/caregiver Ordering medications,  tests, or procedures Referring and communicating with other health care professionals (when not separately reported) Documenting clinical information in the electronic or other health record Independently interpreting results (not separately reported) and communicating results to the patient/ family/caregiver Care coordination (not separately reported)  Note by: Cephus Collin, MD (TTS and AI technology used. I apologize for any typographical errors that were not detected and corrected.) Date: 12/29/2023; Time: 3:54 PM

## 2023-12-30 ENCOUNTER — Ambulatory Visit: Attending: Student | Admitting: Student

## 2023-12-30 ENCOUNTER — Other Ambulatory Visit: Payer: Self-pay | Admitting: Orthopaedic Surgery

## 2023-12-30 ENCOUNTER — Encounter: Payer: Self-pay | Admitting: Student

## 2023-12-30 VITALS — BP 134/80 | HR 80 | Ht 69.0 in | Wt 143.0 lb

## 2023-12-30 DIAGNOSIS — R0989 Other specified symptoms and signs involving the circulatory and respiratory systems: Secondary | ICD-10-CM

## 2023-12-30 DIAGNOSIS — I2510 Atherosclerotic heart disease of native coronary artery without angina pectoris: Secondary | ICD-10-CM

## 2023-12-30 DIAGNOSIS — M25511 Pain in right shoulder: Secondary | ICD-10-CM

## 2023-12-30 DIAGNOSIS — E785 Hyperlipidemia, unspecified: Secondary | ICD-10-CM | POA: Diagnosis present

## 2023-12-30 MED ORDER — HYDRALAZINE HCL 25 MG PO TABS: 25.0000 mg | ORAL_TABLET | Freq: Three times a day (TID) | ORAL | 3 refills | Status: DC

## 2023-12-30 NOTE — Patient Instructions (Signed)
 Medication Instructions:  Your physician recommends that you continue on your current medications as directed. Please refer to the Current Medication list given to you today.  *If you need a refill on your cardiac medications before your next appointment, please call your pharmacy*  Follow-Up: At [facility], you and your health needs are our priority.  As part of our continuing mission to provide you with exceptional heart care, our providers are all part of one team.  This team includes your primary Cardiologist (physician) and Advanced Practice Providers or APPs (Physician Assistants and Nurse Practitioners) who all work together to provide you with the care you need, when you need it.  Your next appointment:   6 month(s)  Provider:   Randene Bustard, MD or Morey Ar, NP    We recommend signing up for the patient portal called "MyChart".  Sign up information is provided on this After Visit Summary.  MyChart is used to connect with patients for Virtual Visits (Telemedicine).  Patients are able to view lab/test results, encounter notes, upcoming appointments, etc.  Non-urgent messages can be sent to your provider as well.   To learn more about what you can do with MyChart, go to [email or web address].

## 2023-12-30 NOTE — Addendum Note (Signed)
 Addended by: Arieliz Latino L on: 12/30/2023 01:30 PM   Modules accepted: Orders

## 2024-01-04 ENCOUNTER — Other Ambulatory Visit: Payer: Self-pay | Admitting: Internal Medicine

## 2024-01-04 MED ORDER — DIAZEPAM 5 MG PO TABS: ORAL_TABLET | ORAL | 0 refills | Status: DC

## 2024-01-05 ENCOUNTER — Telehealth: Payer: Self-pay

## 2024-01-05 NOTE — Telephone Encounter (Signed)
 Workers comp denied the lumbar facets again due to the same rationale. I sent the letter of medical necessity you wrote but they still denied, all the way up to level 3. No need in trying again. They will continue to deny the facets.

## 2024-01-05 NOTE — Telephone Encounter (Signed)
 The patient has called me twice today telling me the reason it was denied is because it was on the wrong form. I put it on the same form as I always have when obtaining prior authorizations. The reason they are giving me is the same reason as before. Not medically necessary to continue to do medial branch blocks when the RFA was unsuccessful. They don't want to pay for therapeutic blocks.

## 2024-01-06 ENCOUNTER — Other Ambulatory Visit: Payer: Self-pay | Admitting: Internal Medicine

## 2024-01-07 NOTE — Telephone Encounter (Signed)
 Requested Prescriptions  Pending Prescriptions Disp Refills   escitalopram  (LEXAPRO ) 20 MG tablet [Pharmacy Med Name: ESCITALOPRAM  20MG  TABLETS] 90 tablet 1    Sig: TAKE 1 TABLET(20 MG) BY MOUTH DAILY     Psychiatry:  Antidepressants - SSRI Passed - 01/07/2024 11:39 AM      Passed - Completed PHQ-2 or PHQ-9 in the last 360 days      Passed - Valid encounter within last 6 months    Recent Outpatient Visits           2 months ago Intractable chronic migraine without aura and without status migrainosus   [place] Anderson, Rankin Buzzard, NP       Future Appointments             In 3 months [name], Rankin Buzzard, NP [facility]   In 4 months Estanislao Heimlich, Dennard Fisher, MD [facility] [place]

## 2024-01-10 ENCOUNTER — Ambulatory Visit
Admission: RE | Admit: 2024-01-10 | Discharge: 2024-01-10 | Disposition: A | Discharge status: Home / Self Care | Source: Ambulatory Visit | Attending: Orthopaedic Surgery

## 2024-01-10 ENCOUNTER — Ambulatory Visit
Admission: RE | Admit: 2024-01-10 | Discharge: 2024-01-10 | Disposition: A | Discharge status: Home / Self Care | Source: Ambulatory Visit | Attending: Orthopaedic Surgery | Admitting: Orthopaedic Surgery

## 2024-01-10 DIAGNOSIS — M25511 Pain in right shoulder: Secondary | ICD-10-CM | POA: Diagnosis present

## 2024-01-10 MED ORDER — LIDOCAINE HCL (PF) 1 % IJ SOLN
10.0000 mL | Freq: Once | INTRAMUSCULAR | Status: AC
  Administered 2024-01-10: 3 mL
  Filled 2024-01-10: qty 10

## 2024-01-10 MED ORDER — SODIUM CHLORIDE (PF) 0.9% IJ SOLUTION - NO CHARGE
20.0000 mL | INTRAMUSCULAR | Status: DC | PRN
Start: 2024-01-10 — End: 2024-01-11
  Administered 2024-01-10: 5 mL via INTRAVENOUS
  Filled 2024-01-10: qty 20

## 2024-01-10 MED ORDER — IOHEXOL 180 MG/ML  SOLN
20.0000 mL | Freq: Once | INTRAMUSCULAR | Status: AC | PRN
Start: 2024-01-10 — End: 2024-01-10
  Administered 2024-01-10: 15 mL

## 2024-01-10 NOTE — Telephone Encounter (Signed)
 I submitted it once more, going exactly by the way I did it before when we got approval,  so there can be no issues with the way I submitted it.  So far levels one and two have denied, so I requested it to be sent to level three. If they deny it this third time we will have to accept their decision.

## 2024-01-12 ENCOUNTER — Ambulatory Visit: Payer: Self-pay

## 2024-01-12 NOTE — Telephone Encounter (Signed)
 Chief Complaint: Fall  Symptoms: Headache, dizziness Frequency: Once, fall occurred on Sunday Pertinent Negatives: Patient denies chest pain, SOB, feeling faint Disposition: [] ED /[] Urgent Care (no appt availability in office) / [x] Appointment(In office/virtual)/ []  [place] [facility]/ [] Home Care/ [] Refused Recommended Disposition /[] [place] [facility]/ []  Follow-up with PCP Additional Notes: Patient states he fell on Sunday night after having a leg cramp he attempted to stand up to stretch the leg and fell over hitting the left side if his head on the night stand. Patient reports the left side was swollen but he has applied ice to the area and it went down. Patient reports a headache that is improved by taking tylenol  but he still feels dizzy some times and the headache continues to return. Care advice was given and patient was scheduled for an appointment with PCP tomorrow afternoon. Care advice given to seek care at urgent care if symptoms get worse. Patient verbalized understanding.  Copied from CRM [id number]. Topic: Clinical - Red Word Triage >> Jan 12, 2024  1:15 PM Lizabeth Riggs wrote: Red Word that prompted transfer to Nurse Triage:  He fell and hit head on Sunday; he is feeling dizzy, fuzzy feeling, headache, no blood Reason for Disposition  MILD weakness (i.e., does not interfere with ability to work, go to school, normal activities)  (Exception: Mild weakness is a chronic symptom.)  Answer Assessment - Initial Assessment Questions 1. MECHANISM: "How did the fall happen?"     I had a leg cramp and I went to get up and I fell over  2. DOMESTIC VIOLENCE AND ELDER ABUSE SCREENING: "Did you fall because someone pushed you or tried to hurt you?" If Yes, ask: "Are you safe now?"     No 3. ONSET: "When did the fall happen?" (e.g., minutes, hours, or days ago)     Sunday  4. LOCATION: "What part of the body hit the ground?" (e.g., back, buttocks, head, hips, knees, hands, head, stomach)      Left side of the Head  5. INJURY: "Did you hurt (injure) yourself when you fell?" If Yes, ask: "What did you injure? Tell me more about this?" (e.g., body area; type of injury; pain severity)"     Yes I have a headache  6. PAIN: "Is there any pain?" If Yes, ask: "How bad is the pain?" (e.g., Scale 1-10; or mild,  moderate, severe)   - NONE (0): No pain   - MILD (1-3): Doesn't interfere with normal activities    - MODERATE (4-7): Interferes with normal activities or awakens from sleep    - SEVERE (8-10): Excruciating pain, unable to do any normal activities      6/10 7. SIZE: For cuts, bruises, or swelling, ask: "How large is it?" (e.g., inches or centimeters)      No  8. PREGNANCY: "Is there any chance you are pregnant?" "When was your last menstrual period?"     N/A 9. OTHER SYMPTOMS: "Do you have any other symptoms?" (e.g., dizziness, fever, weakness; new onset or worsening).      Headache, dizzy, weak 10. CAUSE: "What do you think caused the fall (or falling)?" (e.g., tripped, dizzy spell)       Leg cramp  Protocols used: Falls and [facility]

## 2024-01-13 ENCOUNTER — Encounter: Payer: Self-pay | Admitting: Emergency Medicine

## 2024-01-13 ENCOUNTER — Ambulatory Visit: Admitting: Internal Medicine

## 2024-01-13 ENCOUNTER — Emergency Department

## 2024-01-13 ENCOUNTER — Emergency Department
Admission: EM | Admit: 2024-01-13 | Discharge: 2024-01-13 | Disposition: A | Discharge status: Home / Self Care | Attending: Emergency Medicine | Admitting: Emergency Medicine

## 2024-01-13 ENCOUNTER — Ambulatory Visit: Admission: EM | Admit: 2024-01-13 | Discharge: 2024-01-13 | Disposition: A | Discharge status: Home / Self Care

## 2024-01-13 ENCOUNTER — Other Ambulatory Visit: Payer: Self-pay

## 2024-01-13 DIAGNOSIS — H539 Unspecified visual disturbance: Secondary | ICD-10-CM | POA: Diagnosis not present

## 2024-01-13 DIAGNOSIS — S0003XA Contusion of scalp, initial encounter: Secondary | ICD-10-CM | POA: Insufficient documentation

## 2024-01-13 DIAGNOSIS — S0093XA Contusion of unspecified part of head, initial encounter: Secondary | ICD-10-CM

## 2024-01-13 DIAGNOSIS — S0990XA Unspecified injury of head, initial encounter: Secondary | ICD-10-CM | POA: Diagnosis present

## 2024-01-13 DIAGNOSIS — W228XXA Striking against or struck by other objects, initial encounter: Secondary | ICD-10-CM | POA: Insufficient documentation

## 2024-01-13 DIAGNOSIS — I10 Essential (primary) hypertension: Secondary | ICD-10-CM

## 2024-01-13 DIAGNOSIS — R42 Dizziness and giddiness: Secondary | ICD-10-CM

## 2024-01-13 NOTE — ED Provider Notes (Signed)
 [facility] Provider Note    Event Date/Time   First MD Initiated Contact with Patient 01/13/24 1500     (approximate)   History   Fall   HPI  IZAEAH Young is a 78 year old male presenting to the emergency department for evaluation of headache after a fall.  4 days ago patient woke up in the middle of the night with a cramp in his leg.  He sat up suddenly and rolled over and accidentally hit his head on his nightstand.  No LOC.  Not on anticoagulation.  Since that time he has had ongoing headaches with foggy thoughts.  No numbness, tingling, focal weakness.  No chest pain, shortness of breath.  Spoke with his primary care doctor who recommended ER presentation for CT imaging.     Physical Exam   Triage Vital Signs: ED Triage Vitals  Encounter Vitals Group     BP 01/13/24 1331 (!) 164/81     Systolic BP Percentile --      Diastolic BP Percentile --      Pulse Rate 01/13/24 1331 90     Resp 01/13/24 1331 16     Temp 01/13/24 1331 98.4 F (36.9 C)     Temp Source 01/13/24 1331 Oral     SpO2 01/13/24 1331 97 %     Weight 01/13/24 1332 142 lb (64.4 kg)     Height 01/13/24 1332 5\' 9"  (1.753 m)     Head Circumference --      Peak Flow --      Pain Score 01/13/24 1331 4     Pain Loc --      Pain Education --      Exclude from Growth Chart --     Most recent vital signs: Vitals:   01/13/24 1331  BP: (!) 164/81  Pulse: 90  Resp: 16  Temp: 98.4 F (36.9 C)  SpO2: 97%   Nursing notes and vital signs reviewed.  General: Adult male, laying in bed, awake interactive Head: Healing area of ecchymosis over the left frontotemporal region Cardiac: Regular rhythm and rate.  Respiratory: Lungs clear to auscultation Abdomen: Soft, nondistended. No tenderness to palpation.  MSK: No deformity to bilateral upper and lower extremity. Full range of motion to bilateral upper lower extremity. Neuro: Alert and oriented, normal extraocular movements, symmetric  facial movement, sensation intact over bilateral upper and lower extremities with 5 out of 5 strength.  Normal finger-to-nose testing. Skin: No evidence of burns or lacerations.  ED Results / Procedures / Treatments   Labs (all labs ordered are listed, but only abnormal results are displayed) Labs Reviewed - No data to display   EKG EKG independently reviewed interpreted by myself (ER attending) demonstrates:    RADIOLOGY Imaging independently reviewed and interpreted by myself demonstrates:  CT head without acute bleed CT C-spine without acute fracture  Formal Radiology Read:  CT HEAD WO CONTRAST ( ) Result Date: 01/13/2024 CLINICAL DATA:  Head trauma, fall out of bed with head strike. Intermittent headaches vision changes and dizziness. EXAM: CT HEAD WITHOUT CONTRAST CT CERVICAL SPINE WITHOUT CONTRAST TECHNIQUE: Multidetector CT imaging of the head and cervical spine was performed following the standard protocol without intravenous contrast. Multiplanar CT image reconstructions of the cervical spine were also generated. RADIATION DOSE REDUCTION: This exam was performed according to the departmental dose-optimization program which includes automated exposure control, adjustment of the mA and/or kV according to patient size and/or use of iterative reconstruction technique.  COMPARISON:  CT head 03/25/2023, CT cervical spine 07/30/2020. FINDINGS: CT HEAD FINDINGS Brain: No acute intracranial hemorrhage. No CT evidence of acute infarct. Remote cortical infarcts in the parasagittal right frontal lobe, right parietal lobe, inferior right occipital lobe, and left temporal occipital lobes. Remote infarcts in the bilateral cerebellum. Nonspecific hypoattenuation in the periventricular and subcortical white matter favored to reflect chronic microvascular ischemic changes. No edema, mass effect, or midline shift. The basilar cisterns are patent. Ventricles: Prominence of the ventricles suggesting  underlying parenchymal volume loss. Vascular: Atherosclerotic calcifications of the carotid siphons and intracranial vertebral arteries. No hyperdense vessel. Skull: No acute or aggressive finding. Orbits: Orbits are symmetric. Sinuses: Mild mucosal thickening in the ethmoid sinuses. Mucous retention cyst in the left maxillary sinus. Other: Mastoid air cells are clear. CT CERVICAL SPINE FINDINGS Alignment: Alignment is maintained. No listhesis. No facet subluxation or dislocation. Skull base and vertebrae: No compression fracture or displaced fracture in the cervical spine. No suspicious osseous lesion. Soft tissues and spinal canal: No prevertebral fluid or swelling. No visible canal hematoma. Disc levels: Intervertebral disc space narrowing most pronounced at C6-7. Small disc bulges at multiple levels. Mild spinal canal stenosis at multiple levels. Facet arthrosis and uncovertebral hypertrophy throughout the cervical spine. Foraminal narrowing most pronounced on the right at C5-6. Upper chest: None. Other: None. IMPRESSION: No CT evidence of acute intracranial abnormality. No acute fracture or traumatic malalignment of the cervical spine. 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 Electronically Signed   By: Denny Flack M.D.   On: 01/13/2024 15:27   CT Cervical Spine Wo Contrast Result Date: 01/13/2024 CLINICAL DATA:  Head trauma, fall out of bed with head strike. Intermittent headaches vision changes and dizziness. EXAM: CT HEAD WITHOUT CONTRAST CT CERVICAL SPINE WITHOUT CONTRAST TECHNIQUE: Multidetector CT imaging of the head and cervical spine was performed following the standard protocol without intravenous contrast. Multiplanar CT image reconstructions of the cervical spine were also generated. RADIATION DOSE REDUCTION: This exam was performed according to the departmental dose-optimization program which includes automated exposure control, adjustment of the mA and/or kV  according to patient size and/or use of iterative reconstruction technique. COMPARISON:  CT head 03/25/2023, CT cervical spine 07/30/2020. FINDINGS: CT HEAD FINDINGS Brain: No acute intracranial hemorrhage. No CT evidence of acute infarct. Remote cortical infarcts in the parasagittal right frontal lobe, right parietal lobe, inferior right occipital lobe, and left temporal occipital lobes. Remote infarcts in the bilateral cerebellum. Nonspecific hypoattenuation in the periventricular and subcortical white matter favored to reflect chronic microvascular ischemic changes. No edema, mass effect, or midline shift. The basilar cisterns are patent. Ventricles: Prominence of the ventricles suggesting underlying parenchymal volume loss. Vascular: Atherosclerotic calcifications of the carotid siphons and intracranial vertebral arteries. No hyperdense vessel. Skull: No acute or aggressive finding. Orbits: Orbits are symmetric. Sinuses: Mild mucosal thickening in the ethmoid sinuses. Mucous retention cyst in the left maxillary sinus. Other: Mastoid air cells are clear. CT CERVICAL SPINE FINDINGS Alignment: Alignment is maintained. No listhesis. No facet subluxation or dislocation. Skull base and vertebrae: No compression fracture or displaced fracture in the cervical spine. No suspicious osseous lesion. Soft tissues and spinal canal: No prevertebral fluid or swelling. No visible canal hematoma. Disc levels: Intervertebral disc space narrowing most pronounced at C6-7. Small disc bulges at multiple levels. Mild spinal canal stenosis at multiple levels. Facet arthrosis and uncovertebral hypertrophy throughout the cervical spine. Foraminal narrowing most pronounced on the right at C5-6. Upper chest: None. Other:  None. IMPRESSION: No CT evidence of acute intracranial abnormality. No acute fracture or traumatic malalignment of the cervical spine. 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  Electronically Signed   By: Denny Flack M.D.   On: 01/13/2024 15:27    PROCEDURES:  Critical Care performed: No  Procedures   MEDICATIONS ORDERED IN ED: Medications - No data to display   IMPRESSION / MDM / ASSESSMENT AND PLAN / ED COURSE  I reviewed the triage vital signs and the nursing notes.  Differential diagnosis includes, but is not limited to: Intracranial bleed, skull fracture, spine fracture, no evidence of thoracoabdominal trauma  Patient's presentation is most consistent with acute presentation with potential threat to life or bodily function.  Patient presents after mechanical fall with head trauma. CT head and C-spine without acute traumatic injury. No new complaints on reeevaluation.  Clinical history is consistent with postconcussive syndrome.  Do think patient is stable for discharge. Strict return precautions provided.     FINAL CLINICAL IMPRESSION(S) / ED DIAGNOSES   Final diagnoses:  Closed head injury, initial encounter     Rx / DC Orders   ED Discharge Orders     None        Note:  This document was prepared using Dragon voice recognition software and may include unintentional dictation errors.   Claria Crofts, MD 01/13/24 [phone]

## 2024-01-13 NOTE — ED Notes (Signed)
 Pt reports his wife will be picking him up. Explained to patient to wait in the room until his wife arrives due to patient being on fall precautions.

## 2024-01-13 NOTE — ED Triage Notes (Signed)
 Pt to ED via POV. Pt states that 4 days ago he woke up with a cramp in his leg. When pt got out of bed, he fell and hit his head. Pt reports that he is still having pain in his head. Pt does not take blood thinners. Pt states that he is having intermittent headaches, visual changes, and dizziness since hitting his head.

## 2024-01-13 NOTE — ED Notes (Signed)
 Patient is being discharged from the Urgent Care and sent to the Emergency Department via Personal Vehicle . Per Ruthann Cover, [place], patient is in need of higher level of care due to Head Injury. Patient is aware and verbalizes understanding of plan of care.  Vitals:   01/13/24 1101  BP: (!) 150/84  Pulse: 82  Resp: 16  Temp: 97.7 F (36.5 C)  SpO2: 98%

## 2024-01-13 NOTE — ED Provider Notes (Signed)
 [facility]    CSN: [id number] Arrival date & time: 01/13/24  1041      History   Chief Complaint Chief Complaint  Patient presents with   Head Injury    HPI Grant Young is a 78 y.o. male presenting with his wife for 3 day history of worsening headaches, dizziness, fatigue, visual disturbances, balance problems and feeling "foggy headed."  Patient says he was having really bad leg cramps 4 days ago and when he leaned forward to massage his calves he fell and hit his head on the nightstand.  He reports a large contusion and swelling to the left side of his head that he has been applying ice to and taking Tylenol  continues to feel worse each day.  Wife says he is just been laying around and is more tired than normal.  He denies loss of consciousness, recurrent fall, vomiting, numbness/weakness, chest pain, shortness of breath.  Patient contacted his and was advised to go to urgent care for ED evaluation.  Patient presented to urgent care in [place] today with these concerns about wanting to have a CT of his head done.  He was directed to this urgent care to have a CT performed. He is not on any anticoagulants.   Medical history is significant for hypertension, hyperlipidemia, CAD with history of previous MI, chronic pain, tremors, anxiety/depression, GERD, IBS, overactive bladder, restless leg syndrome, and  polyneuropathy.  HPI  Past Medical History:  Diagnosis Date   Anemia    Aneurysm of ascending aorta (HCC)    a.) CT chest 08/18/2022: 4.1 cm; b.) CT chest 12/07/2022: 4.2 cm   Anxiety    a.) on BZO (diazepam ) PRN   Aortic atherosclerosis (HCC)    Chronic back pain    Coronary artery disease    a.) s/p PCI with DES x 2 (pLCx and o-mLAD) 11/27/2021   Depression    Erectile dysfunction    a.) Bi-mix (papaverine + phentolamine) injections + exogenous testosterone  injections   GERD (gastroesophageal reflux disease)    h/o Lyme disease    Headache    Hiatal hernia     History of bilateral cataract extraction 01/2021   History of gastritis    History of kidney stones    History of neuropathy    Hyperlipidemia LDL goal <70    a.) CAD with NSTEMI -->  intolerant of statins with statin myopathy and memory issues.;  Also intolerant of Repatha    Hypertension    Labile blood pressures but dizziness with blood pressures in the "normal range"; intolerant of most medications including ARB's, ACE inhibitor's, amlodipine most beta-blockers other than propranolol .   Insomnia    a.) takes malatonin PRN   Left lower lobe pulmonary nodule    a.) chest CT 12/07/2022: nodules x 2 posteromedial LLL;  8 x 11 mm and 8 x 10 mm   Long term current use of antithrombotics/antiplatelets    a.) DAPT (ASA + clopidogrel )   Long term current use of aromatase inhibitor    a,) anastrozole  --> estridiol suppression secondary to exogenous testosterone  use   Lumbar radicular pain    Myocardial infarction (HCC)    a.) MI x 2 - 1990 * 1999 - PTCA   NSTEMI (non-ST elevated myocardial infarction) (HCC) 11/27/2021   a.) LHC 11/28/2021: sequential 75% o-pLAD, 70% OM2, 80% p-mLAD, 99% mLAD, 100% pLCx --> PCI placing a 2.5 x 26 mm Onyx Frontier DES to pLCx and a 2.5 x 24 mm Onyx  Frontier DES to the o-m LAD (covering 3 lesions)   Overactive bladder    Pneumonia    PONV (postoperative nausea and vomiting)    Recurrent herpes labialis    a.) has suppressive valacyclovir  to use PRN   Skin cancer, basal cell    Spinal cord stimulator status    01/08/21 - not currently using.   Squamous cell skin cancer    Substance abuse [facility])     Patient Active Problem List   Diagnosis Date Noted   Chronic right shoulder pain 11/02/2023   Disorder of right suprascapular nerve 11/02/2023   History of cold sores 11/20/2022   Coronary artery disease involving native coronary artery of native heart without angina pectoris 08/18/2022   Chronic migraine without aura 11/28/2021   Restless leg syndrome  09/01/2021   Orthostasis 09/01/2021   OAB (overactive bladder) 12/03/2020   GERD (gastroesophageal reflux disease) 10/24/2020   Hypotestosteronism 10/24/2020   IBS (irritable bowel syndrome) 10/24/2020   Lyme disease 10/24/2020   Lumbar spondylosis 12/19/2019   Chronic pain syndrome 12/19/2019   Tremor 09/27/2019   Labile hypertension 09/21/2019   Neuropathy, axillary nerve 12/27/2013   Anxiety and depression 12/21/2001   Hyperlipidemia LDL goal <70 09/30/2001   Peripheral polyneuropathy 09/15/1999    Past Surgical History:  Procedure Laterality Date   BACK SURGERY     BASAL CELL CARCINOMA EXCISION     BIOPSY  07/27/2023   Procedure: BIOPSY;  Surgeon: Luke Salaam, MD;  Location: [facility] ENDOSCOPY;  Service: Gastroenterology;;   CATARACT EXTRACTION W/PHACO Left 01/14/2021   Procedure: CATARACT EXTRACTION PHACO AND INTRAOCULAR LENS PLACEMENT (IOC) LEFT VIVITY TORIC LENS 8.75 00:56.7;  Surgeon: Clair Crews, MD;  Location: [facility];  Service: Ophthalmology;  Laterality: Left;   CATARACT EXTRACTION W/PHACO Right 01/28/2021   Procedure: CATARACT EXTRACTION PHACO AND INTRAOCULAR LENS PLACEMENT (IOC) RIGHT VIVITY TORIC LENS;  Surgeon: Clair Crews, MD;  Location: [facility];  Service: Ophthalmology;  Laterality: Right;  6.54 00:46.4   CHOLECYSTECTOMY  2017   COLONOSCOPY  09/15/2015   Per New Patient Packet   COLONOSCOPY WITH PROPOFOL  N/A 10/03/2020   Procedure: COLONOSCOPY WITH PROPOFOL ;  Surgeon: Luke Salaam, MD;  Location: [facility] ENDOSCOPY;  Service: Gastroenterology;  Laterality: N/A;   CORONARY/GRAFT ACUTE MI REVASCULARIZATION N/A 11/27/2021   Procedure: Coronary/Graft Acute MI Revascularization;  Surgeon: Arleen Lacer, MD;  Location: [facility] CATH: (NSTEMI) - > 100% prox-mid LCx (Onyx Frontier DES 2.5 x 26 -> 2.7 mm, Ost  OM1 65%.  Ost-mid LAD 3 lesions 75%, 90%, 99% => DES PCI Onyx Frontier DES 2.5 x 34 -> 2.8 mm   ESOPHAGOGASTRODUODENOSCOPY (EGD) WITH  PROPOFOL  N/A 10/03/2020   Procedure: ESOPHAGOGASTRODUODENOSCOPY (EGD) WITH PROPOFOL ;  Surgeon: Luke Salaam, MD;  Location: [facility] ENDOSCOPY;  Service: Gastroenterology;  Laterality: N/A;   ESOPHAGOGASTRODUODENOSCOPY (EGD) WITH PROPOFOL  N/A 07/27/2023   Procedure: ESOPHAGOGASTRODUODENOSCOPY (EGD) WITH PROPOFOL ;  Surgeon: Luke Salaam, MD;  Location: [facility] ENDOSCOPY;  Service: Gastroenterology;  Laterality: N/A;   FOOT SURGERY Bilateral 03/18/2020   FOOT SURGERY  08/032021   GALLBLADDER SURGERY  09/15/2015   Gallbladder Removal. Procedure done by Dr.Beverly. Per New Patient Packet   KIDNEY STONE SURGERY  09/14/1980   Too many to count. Per New Patient Packet 09/14/1980-09/15/1995   KIDNEY STONE SURGERY  09/15/1995   Too many to count. Per New Patient Packet   LEFT HEART CATH AND CORONARY ANGIOGRAPHY N/A 11/27/2021   Procedure: LEFT HEART CATH AND CORONARY ANGIOGRAPHY;  Surgeon: Arleen Lacer,  MD;  Location: [facility] CATH:  NSTEMI - 2 V CAD: 100% thrombotic prox-mid LCx (DES PCI), ost OM1 65%; Ost LAD 75% - prox LAD 90%, prox-mid 99% (DES PCI).  MIld diffuse RCA disease - calcicifed.  R-L collaterals filling LCx. EF ~45-50% with lateral HK. LVEDP 28 mmHg   LITHOTRIPSY  09/14/2014   Per New Patient Packet   LITHOTRIPSY  09/14/1996   No Stints Used. Per New Patient Packet   LITHOTRIPSY  09/14/1997   No Stints used. Per New Patient Packet   PAIN PUMP IMPLANTATION     PAIN PUMP REMOVAL     SHOULDER SURGERY Right 1984   SIGMOIDOSCOPY  09/14/2017   Per New Patient Packet   SPINAL CORD STIMULATOR IMPLANT     TONSILLECTOMY     TRANSTHORACIC ECHOCARDIOGRAM  11/28/2021   (in setting of non-STEMI): EF 50 to 55%.  Suspect inferior and lateral hypokinesis.  Indeterminate diastolic parameters.  Normal RV size and function.  Normal RAP.  Normal aortic and mitral valves with only mild MR and AI.  Normal RVP and RAP.   UPPER GI ENDOSCOPY     VIDEO BRONCHOSCOPY WITH ENDOBRONCHIAL ULTRASOUND N/A 01/15/2023    Procedure: VIDEO BRONCHOSCOPY WITH ENDOBRONCHIAL ULTRASOUND;  Surgeon: Erskin Hearing, MD;  Location: [facility] ORS;  Service: Thoracic;  Laterality: N/A;       Home Medications    Prior to Admission medications   Medication Sig Start Date End Date Taking? Authorizing Provider  amitriptyline  (ELAVIL ) 10 MG tablet Take 3 tablets (30 mg total) by mouth at bedtime. 09/21/23 09/15/24  Phebe Brasil, MD  anastrozole  (ARIMIDEX ) 1 MG tablet 1/2 tablet (0.5 mg) by mouth once weekly 05/07/21   [provider]  aspirin  EC 81 MG tablet Take 1 tablet (81 mg total) by mouth daily. Swallow whole. 09/16/23   Arleen Lacer, MD  Butalbital -APAP-Caffeine  50-300-40 MG CAPS Take 1 capsule by mouth every 6 (six) hours as needed. 06/29/23   Carollynn Cirri, NP  carbidopa-levodopa (SINEMET IR) 25-100 MG tablet 3 (three) times daily.    [provider]  Cholecalciferol  50 MCG (2000 UT) CAPS Take 1 tablet by mouth daily. 04/15/20   [provider]  cholestyramine  (QUESTRAN ) 4 g packet as needed (diarrea). 10/16/20   [provider]  Cyanocobalamin  (VITAMIN B-12) 5000 MCG LOZG Take 1 lozenge by mouth daily.     [provider]  DHEA 25 MG CAPS Take 25 mg by mouth daily.    [provider]  diazepam  (VALIUM ) 5 MG tablet TAKE 1 TABLET BY MOUTH EVERY MORNING AND 2 TABLETS EVERY NIGHT AT BEDTIME 01/04/24   Carollynn Cirri, NP  dicyclomine  (BENTYL ) 10 MG capsule TAKE 1 CAPSULE (10 MG TOTAL) BY MOUTH 4 TIMES A DAY BEFORE MEALS AND AT BEDTIME Patient taking differently: as needed for spasms. 05/21/23   Karamalegos, Kayleen Party, DO  Digestive Aids Mixture (DIGESTION GB PO) Take 1 capsule by mouth daily.    [provider]  escitalopram  (LEXAPRO ) 20 MG tablet TAKE 1 TABLET(20 MG) BY MOUTH DAILY 01/07/24   Carollynn Cirri, NP  ezetimibe -simvastatin  (VYTORIN ) 10-20 MG tablet Take 1 tablet by mouth daily. 10/19/23   Arleen Lacer, MD  hydrALAZINE  (APRESOLINE ) 25 MG tablet Take 1  tablet (25 mg total) by mouth 3 (three) times daily. In the morning and at dinner, may take extra tablet for BP greater than 160 12/30/23   Wittenborn, Deborah, NP  ipratropium (ATROVENT ) 0.03 % nasal spray Place 2  sprays into both nostrils every 12 (twelve) hours. 09/20/23   Mardene Shake, FNP  ketoconazole (NIZORAL) 2 % shampoo Apply 1 Application topically once. 08/02/23   [provider]  Loperamide  HCl (IMODIUM  PO) Take by mouth as needed.     [provider]  MAGNESIUM  BISGLYCINATE PO Take by mouth daily.    [provider]  melatonin 5 MG TABS Take 5 mg by mouth at bedtime.    [provider]  MIEBO 1.338 GM/ML SOLN Place 1 drop into both eyes 4 (four) times daily. 09/23/23   [provider]  Na Sulfate-K Sulfate-Mg Sulfate concentrate (SUPREP) 17.5-3.13-1.6 GM/177ML SOLN as directed. colonoscopy 12/28/23   [provider]  NONFORMULARY OR COMPOUNDED ITEM Bi-Mix Papaverine 30mg , Phentolamine 1mg    Dosage: Inject 0.25cc-0.5cc as need for ED   Vial 1ml   Qty #5 Refills 6   [facility] [phone] Fax [phone] 05/28/23   Kathreen Pare, PA-C  Nutritional Supplements (NUTRITIONAL SUPPLEMENT PO) Take 1 capsule by mouth daily. [name]    [provider]  POTASSIUM PO Take 1 tablet by mouth daily at 6 (six) AM. Takes 5x weekly.    [provider]  PREVIDENT 5000 DRY MOUTH 1.1 % GEL dental gel SMARTSIG:To Teeth 3 Times Daily 05/27/23   [provider]  Probiotic Product (PROBIOTIC DAILY PO) Take by mouth daily.    [provider]  prochlorperazine  (COMPAZINE ) 10 MG tablet Take 1 tablet (10 mg total) by mouth 2 (two) times daily as needed for nausea or vomiting. 10/11/23   Carollynn Cirri, NP  pyridostigmine  (MESTINON ) 60 MG tablet Take 1 tablet (60 mg total) by mouth 2 (two) times daily. 12/27/23   Carollynn Cirri, NP  RABEprazole  (ACIPHEX ) 20 MG tablet Take 2 tablets (40 mg  total) by mouth 2 (two) times daily. 08/23/23 08/17/24  Luke Salaam, MD  sucralfate  (CARAFATE ) 1 g tablet Take 1 tablet (1 g total) by mouth 4 (four) times daily. 08/02/23 07/27/24  Luke Salaam, MD  testosterone  cypionate (DEPOTESTOSTERONE CYPIONATE) 200 MG/ML injection Inject 80 mg into the muscle every 14 (fourteen) days. 11/27/20   [provider]  tizanidine  (ZANAFLEX ) 2 MG capsule Take 1 capsule (2 mg total) by mouth every 12 (twelve) hours as needed for muscle spasms. 12/20/23   Carollynn Cirri, NP  triamcinolone cream (KENALOG) 0.1 % as needed (skin irritation, insect bites).    [provider]  valACYclovir  (VALTREX ) 1000 MG tablet Take 2 tabs p.o. and repeat in 12 hours as needed for cold sore 10/29/21   Carollynn Cirri, NP    Family History Family History  Problem Relation Age of Onset   Heart disease Father    Heart failure Father    Hypertension Father    Stroke Father    Stroke Mother    Dementia Mother    Kidney Stones Daughter    Anxiety disorder Daughter    OCD Daughter     Social History Social History   Tobacco Use   Smoking status: Never    Passive exposure: Never   Smokeless tobacco: Never  Vaping Use   Vaping status: Never Used  Substance Use Topics   Alcohol use: Yes    Comment: 1 Drink a Month, socially   Drug use: Not Currently     Allergies   Ace inhibitors, Fluoxetine, Metoclopramide, Nalbuphine, Other, Amoxicillin-pot clavulanate, Doxazosin, Duloxetine, Penicillins, Tamsulosin, Trazodone and nefazodone, Alirocumab , Amlodipine, Cinoxacin, Ciprofloxacin, Fludrocortisone , Nebivolol, Olanzapine, Olmesartan,  Pregabalin, Prostaglandins, Thyroid  hormones, Venlafaxine , Venlafaxine  hcl er, Zolpidem, Duloxetine hcl, Fluoxetine hcl, Nucynta  [tapentadol ], and Phenytoin   Review of Systems Review of Systems  Constitutional:  Positive for fatigue.  Eyes:  Positive for visual disturbance. Negative for photophobia.  Respiratory:  Negative for  shortness of breath.   Cardiovascular:  Negative for chest pain and palpitations.  Gastrointestinal:  Negative for nausea and vomiting.  Skin:  Positive for color change. Negative for wound.  Neurological:  Positive for dizziness, light-headedness and headaches. Negative for tremors, seizures, syncope, facial asymmetry, speech difficulty, weakness and numbness.     Physical Exam Triage Vital Signs ED Triage Vitals [01/13/24 1101]  Encounter Vitals Group     BP (!) 150/84     Systolic BP Percentile      Diastolic BP Percentile      Pulse Rate 82     Resp 16     Temp 97.7 F (36.5 C)     Temp Source Oral     SpO2 98 %     Weight      Height      Head Circumference      Peak Flow      Pain Score 5     Pain Loc      Pain Education      Exclude from Growth Chart    No data found.  Updated Vital Signs BP (!) 150/84 (BP Location: Left Arm)   Pulse 82   Temp 97.7 F (36.5 C) (Oral)   Resp 16   SpO2 98%     Physical Exam Vitals and nursing note reviewed.  Constitutional:      General: He is not in acute distress.    Appearance: Normal appearance. He is well-developed. He is not ill-appearing.  HENT:     Head:     Comments: Contusion left frontal-temporal region    Right Ear: Tympanic membrane, ear canal and external ear normal.     Left Ear: External ear normal. There is impacted cerumen.     Nose: Nose normal.     Mouth/Throat:     Mouth: Mucous membranes are moist.     Pharynx: Oropharynx is clear.  Eyes:     Conjunctiva/sclera: Conjunctivae normal.  Cardiovascular:     Rate and Rhythm: Normal rate and regular rhythm.  Pulmonary:     Effort: Pulmonary effort is normal. No respiratory distress.     Breath sounds: Normal breath sounds.  Musculoskeletal:     Cervical back: Neck supple.  Skin:    General: Skin is warm and dry.     Capillary Refill: Capillary refill takes less than 2 seconds.  Neurological:     General: No focal deficit present.     Mental  Status: He is alert and oriented to person, place, and time. Mental status is at baseline.     Motor: No weakness.     Coordination: Coordination normal.     Gait: Gait normal.     Comments: 4/5 strength left upper extremity, 5/5 RUE  Psychiatric:        Mood and Affect: Mood normal.        Behavior: Behavior normal.      UC Treatments / Results  Labs (all labs ordered are listed, but only abnormal results are displayed) Labs Reviewed - No data to display  EKG   Radiology No results found.  Procedures Procedures (including critical care time)  Medications Ordered in UC Medications - No data  to display  Initial Impression / Assessment and Plan / UC Course  I have reviewed the triage vital signs and the nursing notes.  Pertinent labs & imaging results that were available during my care of the patient were reviewed by me and considered in my medical decision making (see chart for details).   78 year old male presents for concerns about worsening headaches, dizziness, feeling "foggy headed" "gait/balance problems and fatigue over the past 3 days.  Patient fell and hit the left side of his head and suffered a hematoma/contusion 4 days ago.  Has been icing area and taking Tylenol  without relief. He denies loss of consciousness.  Patient would like to have a CT scan performed since his PCP told him he needed to have one.  He went to a different urgent care today was instructed to come to this urgent care for CT scan.  Blood pressure is elevated at 150/84.  Patient is overall well-appearing.  No acute distress. Alert and oriented x 3. He does have a healing contusion of the left frontal temporal region.  Normal cranial nerves.  Mild discrepancy with strength of extremities.  4-5 of left upper extremity compared to 5 out of 5 at the right upper extremity.  Normal nose to finger testing.  I did advise patient that he absolutely should have a CT scan performed.  Unfortunately, we do not  have access to CT at this urgent care today.  Patient was upset to hear that he had been sent here for the CT and CT is not available.  I explained that he will need to present to the emergency department to have a CT scan performed.  Explained to patient and wife that he has concerning symptoms following a head injury and is over 72 years old. These are indications for CT. Patient's wife will take him [facility] ED for scan at this time. Declined EMS. He is leaving in stable condition.    Final Clinical Impressions(s) / UC Diagnoses   Final diagnoses:  Closed head injury, initial encounter  Contusion of head, unspecified part of head, initial encounter  Dizziness  Visual disturbance  Essential hypertension     Discharge Instructions      -Please go to [facility] ED or [facility] ED [place] for CT of head  You have been advised to follow up immediately in the emergency department for concerning signs.symptoms. If you declined EMS transport, please have a family member take you directly to the ED at this time. Do not delay. Based on concerns about condition, if you do not follow up in th e ED, you may risk poor outcomes including worsening of condition, delayed treatment and potentially life threatening issues. If you have declined to go to the ED at this time, you should call your PCP immediately to set up a follow up appointment.  Go to ED for red flag symptoms, including; fevers you cannot reduce with Tylenol /Motrin, severe headaches, vision changes, numbness/weakness in part of the body, lethargy, confusion, intractable vomiting, severe dehydration, chest pain, breathing difficulty, severe persistent abdominal or pelvic pain, signs of severe infection (increased redness, swelling of an area), feeling faint or passing out, dizziness, etc. You should especially go to the ED for sudden acute worsening of condition if you do not elect to go at this time.     ED Prescriptions   None     PDMP not reviewed this encounter.   Floydene Hy, PA-C 01/13/24 1157

## 2024-01-13 NOTE — Discharge Instructions (Signed)
 You were seen in the emergency department today after a head injury. Fortunately, your exam and CT scan were overall reassuring. It is likely that you have a concussion. I have included more information about this in your paperwork. Please follow-up with your primary care doctor within a few days for reevaluation. Return to the ER for any worsening symptoms including worsening headache, confusion, or any other new or concerning symptoms.

## 2024-01-13 NOTE — Discharge Instructions (Signed)
-  Please go to [facility] ED or [facility] ED [place] for CT of head  You have been advised to follow up immediately in the emergency department for concerning signs.symptoms. If you declined EMS transport, please have a family member take you directly to the ED at this time. Do not delay. Based on concerns about condition, if you do not follow up in th e ED, you may risk poor outcomes including worsening of condition, delayed treatment and potentially life threatening issues. If you have declined to go to the ED at this time, you should call your PCP immediately to set up a follow up appointment.  Go to ED for red flag symptoms, including; fevers you cannot reduce with Tylenol /Motrin, severe headaches, vision changes, numbness/weakness in part of the body, lethargy, confusion, intractable vomiting, severe dehydration, chest pain, breathing difficulty, severe persistent abdominal or pelvic pain, signs of severe infection (increased redness, swelling of an area), feeling faint or passing out, dizziness, etc. You should especially go to the ED for sudden acute worsening of condition if you do not elect to go at this time.

## 2024-01-13 NOTE — Telephone Encounter (Signed)
 This patient was not scheduled appropriately and will need to be rescheduled at any 40-minute slot.  I have no availability until next week and advised him to go to the ER/urgent care for further evaluation

## 2024-01-13 NOTE — ED Triage Notes (Signed)
 Pt woke up in the middle of the night 4 days ago with a leg cramp. He went to stand of to stretch and fell hitting the left side of his head against his night stand. He denies LOC. He developed a knot and iced the area. A few hours later he became dizzy, unfocused and had a headache.  Pt took tylenol  for the headache.

## 2024-01-18 ENCOUNTER — Other Ambulatory Visit: Payer: Self-pay | Admitting: Internal Medicine

## 2024-01-18 DIAGNOSIS — M75101 Unspecified rotator cuff tear or rupture of right shoulder, not specified as traumatic: Secondary | ICD-10-CM | POA: Insufficient documentation

## 2024-01-19 ENCOUNTER — Encounter (HOSPITAL_COMMUNITY): Payer: Self-pay

## 2024-01-20 NOTE — Telephone Encounter (Signed)
 Requested medication (s) are due for refill today: na   Requested medication (s) are on the active medication list: yes   Last refill:  10/11/23 #180 0 refills  Future visit scheduled: yes in 3 months  Notes to clinic:  not delegated per protocol . Do you want to refill Rx?     Requested Prescriptions  Pending Prescriptions Disp Refills   prochlorperazine  (COMPAZINE ) 10 MG tablet [Pharmacy Med Name: PROCHLORPERAZINE  10MG  TABLETS] 180 tablet 0    Sig: TAKE 1 TABLET(10 MG) BY MOUTH TWICE DAILY AS NEEDED FOR NAUSEA OR VOMITING     Not Delegated - Gastroenterology: Antiemetics Failed - 01/20/2024  8:51 AM      Failed - This refill cannot be delegated      Passed - Valid encounter within last 6 months    Recent Outpatient Visits           2 months ago Intractable chronic migraine without aura and without status migrainosus   [place] [facility] [place], Rankin Buzzard, NP       Future Appointments             In 3 months [name], Rankin Buzzard, NP [facility]   In 4 months Estanislao Heimlich, Dennard Fisher, MD [facility] [place]

## 2024-01-26 ENCOUNTER — Ambulatory Visit
Admission: RE | Admit: 2024-01-26 | Discharge: 2024-01-26 | Disposition: A | Discharge status: Home / Self Care | Attending: Gastroenterology | Admitting: Gastroenterology

## 2024-01-26 ENCOUNTER — Other Ambulatory Visit: Payer: Self-pay

## 2024-01-26 ENCOUNTER — Ambulatory Visit: Admitting: Anesthesiology

## 2024-01-26 ENCOUNTER — Encounter: Payer: Self-pay | Admitting: Gastroenterology

## 2024-01-26 ENCOUNTER — Encounter
Admission: RE | Disposition: A | Discharge status: Home / Self Care | Payer: Self-pay | Source: Home / Self Care | Attending: Gastroenterology

## 2024-01-26 DIAGNOSIS — F418 Other specified anxiety disorders: Secondary | ICD-10-CM | POA: Diagnosis not present

## 2024-01-26 DIAGNOSIS — Z1211 Encounter for screening for malignant neoplasm of colon: Secondary | ICD-10-CM

## 2024-01-26 DIAGNOSIS — Z955 Presence of coronary angioplasty implant and graft: Secondary | ICD-10-CM | POA: Diagnosis not present

## 2024-01-26 DIAGNOSIS — K449 Diaphragmatic hernia without obstruction or gangrene: Secondary | ICD-10-CM | POA: Diagnosis not present

## 2024-01-26 DIAGNOSIS — I739 Peripheral vascular disease, unspecified: Secondary | ICD-10-CM | POA: Insufficient documentation

## 2024-01-26 DIAGNOSIS — I2510 Atherosclerotic heart disease of native coronary artery without angina pectoris: Secondary | ICD-10-CM | POA: Insufficient documentation

## 2024-01-26 DIAGNOSIS — G709 Myoneural disorder, unspecified: Secondary | ICD-10-CM | POA: Insufficient documentation

## 2024-01-26 DIAGNOSIS — Z8249 Family history of ischemic heart disease and other diseases of the circulatory system: Secondary | ICD-10-CM | POA: Insufficient documentation

## 2024-01-26 DIAGNOSIS — I7121 Aneurysm of the ascending aorta, without rupture: Secondary | ICD-10-CM | POA: Diagnosis not present

## 2024-01-26 DIAGNOSIS — I10 Essential (primary) hypertension: Secondary | ICD-10-CM | POA: Insufficient documentation

## 2024-01-26 DIAGNOSIS — K219 Gastro-esophageal reflux disease without esophagitis: Secondary | ICD-10-CM | POA: Insufficient documentation

## 2024-01-26 DIAGNOSIS — M549 Dorsalgia, unspecified: Secondary | ICD-10-CM | POA: Diagnosis not present

## 2024-01-26 DIAGNOSIS — G629 Polyneuropathy, unspecified: Secondary | ICD-10-CM | POA: Insufficient documentation

## 2024-01-26 DIAGNOSIS — G8929 Other chronic pain: Secondary | ICD-10-CM | POA: Insufficient documentation

## 2024-01-26 DIAGNOSIS — I4510 Unspecified right bundle-branch block: Secondary | ICD-10-CM | POA: Insufficient documentation

## 2024-01-26 DIAGNOSIS — I252 Old myocardial infarction: Secondary | ICD-10-CM | POA: Insufficient documentation

## 2024-01-26 HISTORY — PX: COLONOSCOPY: SHX5424

## 2024-01-26 SURGERY — COLONOSCOPY
Anesthesia: General

## 2024-01-26 MED ORDER — PROPOFOL 10 MG/ML IV BOLUS
INTRAVENOUS | Status: DC | PRN
  Administered 2024-01-26: 50 mg via INTRAVENOUS
  Administered 2024-01-26: 20 mg via INTRAVENOUS

## 2024-01-26 MED ORDER — LIDOCAINE HCL (CARDIAC) PF 100 MG/5ML IV SOSY
PREFILLED_SYRINGE | INTRAVENOUS | Status: DC | PRN
  Administered 2024-01-26: 60 mg via INTRAVENOUS

## 2024-01-26 MED ORDER — PROPOFOL 500 MG/50ML IV EMUL
INTRAVENOUS | Status: DC | PRN
  Administered 2024-01-26: 75 ug/kg/min via INTRAVENOUS

## 2024-01-26 MED ORDER — SODIUM CHLORIDE 0.9 % IV SOLN: INTRAVENOUS | Status: DC

## 2024-01-26 NOTE — Op Note (Signed)
 [facility] Patient Name: Grant Young Procedure Date: 01/26/2024 11:01 AM MRN: [id number] Account #: [id number] Date of Birth: 1946-01-19 Admit Type: Outpatient Age: 78 Room: [facility] ENDO ROOM 3 Gender: Male Note Status: Finalized Instrument Name: Charlyn Cooley [id number] Procedure:             Colonoscopy Indications:           Screening for colorectal malignant neoplasm Providers:             Luke Salaam MD, MD Referring MD:          Luke Salaam MD, MD (Referring MD), Carollynn Cirri                         (Referring MD) Medicines:             Monitored Anesthesia Care Complications:         No immediate complications. Procedure:             Pre-Anesthesia Assessment:                        - Prior to the procedure, a History and Physical was                         performed, and patient medications, allergies and                         sensitivities were reviewed. The patient's tolerance                         of previous anesthesia was reviewed.                        - The risks and benefits of the procedure and the                         sedation options and risks were discussed with the                         patient. All questions were answered and informed                         consent was obtained.                        - ASA Grade Assessment: II - A patient with mild                         systemic disease.                        After obtaining informed consent, the colonoscope was                         passed under direct vision. Throughout the procedure,                         the patient's blood pressure, pulse, and oxygen                         saturations were  monitored continuously. The                         Colonoscope was introduced through the anus and                         advanced to the the cecum, identified by the                         appendiceal orifice. The colonoscopy was performed                          with ease. The patient tolerated the procedure well.                         The quality of the bowel preparation was inadequate.                         Anatomical landmarks were photographed. Findings:      The perianal and digital rectal examinations were normal.      Stool was found in the entire colon. Impression:            - Preparation of the colon was inadequate.                        - Stool in the entire examined colon.                        - No specimens collected. Recommendation:        - Discharge patient to home (with escort).                        - Resume previous diet.                        - Continue present medications.                        - Repeat colonoscopy in 3 months because the bowel                         preparation was suboptimal. Procedure Code(s):     --- Professional ---                        [phone], Colonoscopy, flexible; diagnostic, including                         collection of specimen(s) by brushing or washing, when                         performed (separate procedure) Diagnosis Code(s):     --- Professional ---                        Z12.11, Encounter for screening for malignant neoplasm                         of colon CPT copyright 2022 [employer]. All rights reserved. The codes documented in this report are preliminary and upon coder  review may  be revised to meet current compliance requirements. Luke Salaam, MD Luke Salaam MD, MD 01/26/2024 11:26:57 AM This report has been signed electronically. Number of Addenda: 0 Note Initiated On: 01/26/2024 11:01 AM Scope Withdrawal Time: 0 hours 2 minutes 52 seconds  Total Procedure Duration: 0 hours 7 minutes 7 seconds  Estimated Blood Loss:  Estimated blood loss: none.      [facility]

## 2024-01-26 NOTE — Anesthesia Postprocedure Evaluation (Signed)
 Anesthesia Post Note  Patient: Grant Young  Procedure(s) Performed: COLONOSCOPY  Patient location during evaluation: PACU Anesthesia Type: General Level of consciousness: awake and alert, oriented and patient cooperative Pain management: pain level controlled Vital Signs Assessment: post-procedure vital signs reviewed and stable Respiratory status: spontaneous breathing, nonlabored ventilation and respiratory function stable Cardiovascular status: blood pressure returned to baseline and stable Postop Assessment: adequate PO intake Anesthetic complications: no   No notable events documented.   Last Vitals:  Vitals:   01/26/24 1138 01/26/24 1146  BP: (!) 146/83 (!) 156/89  Pulse: 69 67  Resp: 16 13  Temp:    SpO2: 98% 100%    Last Pain:  Vitals:   01/26/24 1146  TempSrc:   PainSc: 0-No pain                 Dorothey Gate

## 2024-01-26 NOTE — Anesthesia Preprocedure Evaluation (Addendum)
 Anesthesia Evaluation  Patient identified by MRN, date of birth, ID band Patient awake    Reviewed: Allergy & Precautions, NPO status , Patient's Chart, lab work & pertinent test results  History of Anesthesia Complications (+) PONV and history of anesthetic complications  Airway Mallampati: IV   Neck ROM: Full    Dental  (+) Missing   Pulmonary neg pulmonary ROS   Pulmonary exam normal breath sounds clear to auscultation       Cardiovascular hypertension, + CAD (s/p MI and stents) and + Peripheral Vascular Disease (ascending aortic aneurysm)  Normal cardiovascular exam Rhythm:Regular Rate:Normal  ECG 12/30/23:  Sinus rhythm with occasional Premature ventricular complexes Right bundle branch block   Neuro/Psych  Headaches PSYCHIATRIC DISORDERS Anxiety Depression    Chronic back pain  Neuromuscular disease (neuropathy)    GI/Hepatic hiatal hernia,GERD  ,,  Endo/Other  negative endocrine ROS    Renal/GU Renal disease (nephrolithiasis)     Musculoskeletal   Abdominal   Peds  Hematology  (+) Blood dyscrasia, anemia   Anesthesia Other Findings Cardiology note 12/30/23:  CAD MI x 2 in 1990 and 1999 with PTCA performed in [place] .  PCI with DES to proximal LCx and proximal LAD in the setting of NSTEMI March 2023.  Patient denies chest pain, pressure or tightness. He likes to do water aerobics but finds he experiences leg cramps at night when he does. Suggested trying seated exercising as a more low impact options.  -Continue aspirin , Vytorin .   Hypertension BP today 134/80. Home BP log reflects elevated BP consistently in the afternoon. He is experiencing headaches but believes this is a side effect of sinemet.  -Increase hydralazine  to 25 mg three times a day.    Hyperlipidemia LDL 50 January 2025, at goal. -Continue Vytorin .   Disposition: Increase hydralazine  25 mg to 3 times a day. Return in 6 months or sooner as  needed.   Reproductive/Obstetrics                             Anesthesia Physical Anesthesia Plan  ASA: 4  Anesthesia Plan: General   Post-op Pain Management:    Induction: Intravenous  PONV Risk Score and Plan: 3 and Propofol  infusion, TIVA and Treatment may vary due to age or medical condition  Airway Management Planned: Natural Airway  Additional Equipment:   Intra-op Plan:   Post-operative Plan:   Informed Consent: I have reviewed the patients History and Physical, chart, labs and discussed the procedure including the risks, benefits and alternatives for the proposed anesthesia with the patient or authorized representative who has indicated his/her understanding and acceptance.       Plan Discussed with: CRNA  Anesthesia Plan Comments: (LMA/GETA backup discussed.  Patient consented for risks of anesthesia including but not limited to:  - adverse reactions to medications - damage to eyes, teeth, lips or other oral mucosa - nerve damage due to positioning  - sore throat or hoarseness - damage to heart, brain, nerves, lungs, other parts of body or loss of life  Informed patient about role of CRNA in peri- and intra-operative care.  Patient voiced understanding.)        Anesthesia Quick Evaluation

## 2024-01-26 NOTE — Transfer of Care (Signed)
 Immediate Anesthesia Transfer of Care Note  Patient: Grant Young  Procedure(s) Performed: COLONOSCOPY  Patient Location: PACU  Anesthesia Type:General  Level of Consciousness: sedated  Airway & Oxygen Therapy: Patient Spontanous Breathing  Post-op Assessment: Report given to RN and Post -op Vital signs reviewed and stable  Post vital signs: Reviewed and stable  Last Vitals:  Vitals Value Taken Time  BP 149/74 01/26/24 1128  Temp 36.2 C 01/26/24 1128  Pulse 71 01/26/24 1128  Resp 16 01/26/24 1128  SpO2 100 % 01/26/24 1128    Last Pain:  Vitals:   01/26/24 1128  TempSrc: Temporal  PainSc: Asleep         Complications: No notable events documented.

## 2024-01-26 NOTE — H&P (Signed)
 Grant Salaam, MD [address], [place], [place], [address] [address], [address], [place], [place], [address] Phone: [phone]  Fax: [phone]  Primary Care Physician:  Carollynn Cirri, NP   Pre-Procedure History & Physical: HPI:  Grant Young is a 78 y.o. male is here for an colonoscopy.   Past Medical History:  Diagnosis Date   Anemia    Aneurysm of ascending aorta (HCC)    a.) CT chest 08/18/2022: 4.1 cm; b.) CT chest 12/07/2022: 4.2 cm   Anxiety    a.) on BZO (diazepam ) PRN   Aortic atherosclerosis (HCC)    Chronic back pain    Coronary artery disease    a.) s/p PCI with DES x 2 (pLCx and o-mLAD) 11/27/2021   Depression    Erectile dysfunction    a.) Bi-mix (papaverine + phentolamine) injections + exogenous testosterone  injections   GERD (gastroesophageal reflux disease)    h/o Lyme disease    Headache    Hiatal hernia    History of bilateral cataract extraction 01/2021   History of gastritis    History of kidney stones    History of neuropathy    Hyperlipidemia LDL goal <70    a.) CAD with NSTEMI -->  intolerant of statins with statin myopathy and memory issues.;  Also intolerant of Repatha    Hypertension    Labile blood pressures but dizziness with blood pressures in the "normal range"; intolerant of most medications including ARB's, ACE inhibitor's, amlodipine most beta-blockers other than propranolol .   Insomnia    a.) takes malatonin PRN   Left lower lobe pulmonary nodule    a.) chest CT 12/07/2022: nodules x 2 posteromedial LLL;  8 x 11 mm and 8 x 10 mm   Long term current use of antithrombotics/antiplatelets    a.) DAPT (ASA + clopidogrel )   Long term current use of aromatase inhibitor    a,) anastrozole  --> estridiol suppression secondary to exogenous testosterone  use   Lumbar radicular pain    Myocardial infarction (HCC)    a.) MI x 2 - 1990 * 1999 - PTCA   NSTEMI (non-ST elevated myocardial infarction) (HCC) 11/27/2021   a.)  LHC 11/28/2021: sequential 75% o-pLAD, 70% OM2, 80% p-mLAD, 99% mLAD, 100% pLCx --> PCI placing a 2.5 x 26 mm Onyx Frontier DES to pLCx and a 2.5 x 24 mm Onyx Frontier DES to the o-m LAD (covering 3 lesions)   Overactive bladder    Pneumonia    PONV (postoperative nausea and vomiting)    Recurrent herpes labialis    a.) has suppressive valacyclovir  to use PRN   Skin cancer, basal cell    Spinal cord stimulator status    01/08/21 - not currently using.   Squamous cell skin cancer    Substance abuse [facility])     Past Surgical History:  Procedure Laterality Date   BACK SURGERY     BASAL CELL CARCINOMA EXCISION     BIOPSY  07/27/2023   Procedure: BIOPSY;  Surgeon: Grant Salaam, MD;  Location: [facility] ENDOSCOPY;  Service: Gastroenterology;;   CATARACT EXTRACTION W/PHACO Left 01/14/2021   Procedure: CATARACT EXTRACTION PHACO AND INTRAOCULAR LENS PLACEMENT (IOC) LEFT VIVITY TORIC LENS 8.75 00:56.7;  Surgeon: Clair Crews, MD;  Location: [facility];  Service: Ophthalmology;  Laterality: Left;   CATARACT EXTRACTION W/PHACO Right 01/28/2021   Procedure: CATARACT EXTRACTION PHACO AND INTRAOCULAR LENS PLACEMENT (IOC) RIGHT VIVITY TORIC LENS;  Surgeon: Clair Crews, MD;  Location: [facility];  Service: Ophthalmology;  Laterality: Right;  6.54 00:46.4   CHOLECYSTECTOMY  2017   COLONOSCOPY  09/15/2015   Per New Patient Packet   COLONOSCOPY WITH PROPOFOL  N/A 10/03/2020   Procedure: COLONOSCOPY WITH PROPOFOL ;  Surgeon: Grant Salaam, MD;  Location: [facility] ENDOSCOPY;  Service: Gastroenterology;  Laterality: N/A;   CORONARY/GRAFT ACUTE MI REVASCULARIZATION N/A 11/27/2021   Procedure: Coronary/Graft Acute MI Revascularization;  Surgeon: Arleen Lacer, MD;  Location: [facility] CATH: (NSTEMI) - > 100% prox-mid LCx (Onyx Frontier DES 2.5 x 26 -> 2.7 mm, Ost  OM1 65%.  Ost-mid LAD 3 lesions 75%, 90%, 99% => DES PCI Onyx Frontier DES 2.5 x 34 -> 2.8 mm   ESOPHAGOGASTRODUODENOSCOPY (EGD) WITH  PROPOFOL  N/A 10/03/2020   Procedure: ESOPHAGOGASTRODUODENOSCOPY (EGD) WITH PROPOFOL ;  Surgeon: Grant Salaam, MD;  Location: [facility] ENDOSCOPY;  Service: Gastroenterology;  Laterality: N/A;   ESOPHAGOGASTRODUODENOSCOPY (EGD) WITH PROPOFOL  N/A 07/27/2023   Procedure: ESOPHAGOGASTRODUODENOSCOPY (EGD) WITH PROPOFOL ;  Surgeon: Grant Salaam, MD;  Location: [facility] ENDOSCOPY;  Service: Gastroenterology;  Laterality: N/A;   FOOT SURGERY Bilateral 03/18/2020   FOOT SURGERY  08/032021   GALLBLADDER SURGERY  09/15/2015   Gallbladder Removal. Procedure done by Dr.Beverly. Per New Patient Packet   KIDNEY STONE SURGERY  09/14/1980   Too many to count. Per New Patient Packet 09/14/1980-09/15/1995   KIDNEY STONE SURGERY  09/15/1995   Too many to count. Per New Patient Packet   LEFT HEART CATH AND CORONARY ANGIOGRAPHY N/A 11/27/2021   Procedure: LEFT HEART CATH AND CORONARY ANGIOGRAPHY;  Surgeon: Arleen Lacer, MD;  Location: [facility] CATH:  NSTEMI - 2 V CAD: 100% thrombotic prox-mid LCx (DES PCI), ost OM1 65%; Ost LAD 75% - prox LAD 90%, prox-mid 99% (DES PCI).  MIld diffuse RCA disease - calcicifed.  R-L collaterals filling LCx. EF ~45-50% with lateral HK. LVEDP 28 mmHg   LITHOTRIPSY  09/14/2014   Per New Patient Packet   LITHOTRIPSY  09/14/1996   No Stints Used. Per New Patient Packet   LITHOTRIPSY  09/14/1997   No Stints used. Per New Patient Packet   PAIN PUMP IMPLANTATION     PAIN PUMP REMOVAL     SHOULDER SURGERY Right 1984   SIGMOIDOSCOPY  09/14/2017   Per New Patient Packet   SPINAL CORD STIMULATOR IMPLANT     TONSILLECTOMY     TRANSTHORACIC ECHOCARDIOGRAM  11/28/2021   (in setting of non-STEMI): EF 50 to 55%.  Suspect inferior and lateral hypokinesis.  Indeterminate diastolic parameters.  Normal RV size and function.  Normal RAP.  Normal aortic and mitral valves with only mild MR and AI.  Normal RVP and RAP.   UPPER GI ENDOSCOPY     VIDEO BRONCHOSCOPY WITH ENDOBRONCHIAL ULTRASOUND N/A 01/15/2023    Procedure: VIDEO BRONCHOSCOPY WITH ENDOBRONCHIAL ULTRASOUND;  Surgeon: Erskin Hearing, MD;  Location: [facility] ORS;  Service: Thoracic;  Laterality: N/A;    Prior to Admission medications   Medication Sig Start Date End Date Taking? Authorizing Provider  amitriptyline  (ELAVIL ) 10 MG tablet Take 3 tablets (30 mg total) by mouth at bedtime. 09/21/23 09/15/24 Yes Phebe Brasil, MD  anastrozole  (ARIMIDEX ) 1 MG tablet 1/2 tablet (0.5 mg) by mouth once weekly 05/07/21  Yes [provider]  aspirin  EC 81 MG tablet Take 1 tablet (81 mg total) by mouth daily. Swallow whole. 09/16/23  Yes Arleen Lacer, MD  Butalbital -APAP-Caffeine  50-300-40 MG CAPS Take 1 capsule by mouth every 6 (six) hours as needed. 06/29/23  Yes  Carollynn Cirri, NP  carbidopa-levodopa (SINEMET IR) 25-100 MG tablet 3 (three) times daily.   Yes [provider]  Cholecalciferol  50 MCG (2000 UT) CAPS Take 1 tablet by mouth daily. 04/15/20  Yes [provider]  Cyanocobalamin  (VITAMIN B-12) 5000 MCG LOZG Take 1 lozenge by mouth daily.    Yes [provider]  DHEA 25 MG CAPS Take 25 mg by mouth daily.   Yes [provider]  diazepam  (VALIUM ) 5 MG tablet TAKE 1 TABLET BY MOUTH EVERY MORNING AND 2 TABLETS EVERY NIGHT AT BEDTIME 01/04/24  Yes [name], Rankin Buzzard, NP  Digestive Aids Mixture (DIGESTION GB PO) Take 1 capsule by mouth daily.   Yes [provider]  escitalopram  (LEXAPRO ) 20 MG tablet TAKE 1 TABLET(20 MG) BY MOUTH DAILY 01/07/24  Yes [name], Rankin Buzzard, NP  ezetimibe -simvastatin  (VYTORIN ) 10-20 MG tablet Take 1 tablet by mouth daily. 10/19/23  Yes Arleen Lacer, MD  hydrALAZINE  (APRESOLINE ) 25 MG tablet Take 1 tablet (25 mg total) by mouth 3 (three) times daily. In the morning and at dinner, may take extra tablet for BP greater than 160 12/30/23  Yes Wittenborn, Deborah, NP  Loperamide  HCl (IMODIUM  PO) Take by mouth as needed.    Yes [provider]  MAGNESIUM  BISGLYCINATE PO Take by mouth  daily.   Yes [provider]  melatonin 5 MG TABS Take 5 mg by mouth at bedtime.   Yes [provider]  Nutritional Supplements (NUTRITIONAL SUPPLEMENT PO) Take 1 capsule by mouth daily. [name]   Yes [provider]  POTASSIUM PO Take 1 tablet by mouth daily at 6 (six) AM. Takes 5x weekly.   Yes [provider]  PREVIDENT 5000 DRY MOUTH 1.1 % GEL dental gel SMARTSIG:To Teeth 3 Times Daily 05/27/23  Yes [provider]  Probiotic Product (PROBIOTIC DAILY PO) Take by mouth daily.   Yes [provider]  prochlorperazine  (COMPAZINE ) 10 MG tablet TAKE 1 TABLET(10 MG) BY MOUTH TWICE DAILY AS NEEDED FOR NAUSEA OR VOMITING 01/20/24  Yes [name], Rankin Buzzard, NP  pyridostigmine  (MESTINON ) 60 MG tablet Take 1 tablet (60 mg total) by mouth 2 (two) times daily. 12/27/23  Yes Carollynn Cirri, NP  RABEprazole  (ACIPHEX ) 20 MG tablet Take 2 tablets (40 mg total) by mouth 2 (two) times daily. 08/23/23 08/17/24 Yes Jarielys Girardot, MD  testosterone  cypionate (DEPOTESTOSTERONE CYPIONATE) 200 MG/ML injection Inject 80 mg into the muscle every 14 (fourteen) days. 11/27/20  Yes [provider]  tizanidine  (ZANAFLEX ) 2 MG capsule Take 1 capsule (2 mg total) by mouth every 12 (twelve) hours as needed for muscle spasms. 12/20/23  Yes [name], Rankin Buzzard, NP  cholestyramine  (QUESTRAN ) 4 g packet as needed (diarrea). 10/16/20   [provider]  dicyclomine  (BENTYL ) 10 MG capsule TAKE 1 CAPSULE (10 MG TOTAL) BY MOUTH 4 TIMES A DAY BEFORE MEALS AND AT BEDTIME Patient taking differently: as needed for spasms. 05/21/23   Karamalegos, Alexander J, DO  ipratropium (ATROVENT ) 0.03 % nasal spray Place 2 sprays into both nostrils every 12 (twelve) hours. 09/20/23   Mardene Shake, FNP  ketoconazole (NIZORAL) 2 % shampoo Apply 1 Application topically once. 08/02/23   [provider]  MIEBO 1.338 GM/ML SOLN Place 1 drop into both eyes 4 (four) times daily. 09/23/23    [provider]  Na Sulfate-K Sulfate-Mg Sulfate concentrate (SUPREP) 17.5-3.13-1.6 GM/177ML SOLN as directed. colonoscopy 12/28/23   [provider]  NONFORMULARY OR COMPOUNDED ITEM  Bi-Mix Papaverine 30mg , Phentolamine 1mg    Dosage: Inject 0.25cc-0.5cc as need for ED   Vial 1ml   Qty #5 Refills 6   [facility] [phone] Fax [phone] 05/28/23   Kathreen Pare, PA-C  sucralfate  (CARAFATE ) 1 g tablet Take 1 tablet (1 g total) by mouth 4 (four) times daily. 08/02/23 07/27/24  Phil Michels, MD  triamcinolone cream (KENALOG) 0.1 % as needed (skin irritation, insect bites).    [provider]  valACYclovir  (VALTREX ) 1000 MG tablet Take 2 tabs p.o. and repeat in 12 hours as needed for cold sore 10/29/21   Carollynn Cirri, NP    Allergies as of 12/28/2023 - Review Complete 12/24/2023  Allergen Reaction Noted   Ace inhibitors  08/24/2019   Fluoxetine Anxiety 10/31/2019   Metoclopramide  10/31/2019   Nalbuphine  10/31/2019   Other  01/03/2014   Amoxicillin-pot clavulanate Nausea Only 10/31/2019   Doxazosin Rash 02/01/2013   Duloxetine Nausea Only 10/31/2019   Penicillins Nausea Only 10/31/2019   Tamsulosin Itching and Anxiety 02/01/2013   Trazodone and nefazodone Itching, Anxiety, and Rash 02/13/2012   Amlodipine  10/31/2019   Cinoxacin  10/31/2019   Ciprofloxacin  08/24/2019   Fludrocortisone  Other (See Comments) 08/04/2021   Nebivolol  08/24/2019   Olanzapine  10/31/2019   Olmesartan  08/24/2019   Pregabalin  10/31/2019   Prostaglandins  05/07/2014   Thyroid  hormones  06/09/2018   Zolpidem  10/31/2019   Duloxetine hcl  01/03/2014   Fluoxetine hcl  01/03/2014   Nucynta  [tapentadol ] Other (See Comments) 07/15/2021   Phenytoin Anxiety 10/31/2019    Family History  Problem Relation Age of Onset   Heart disease Father    Heart failure Father    Hypertension Father    Stroke Father    Stroke Mother    Dementia Mother     Kidney Stones Daughter    Anxiety disorder Daughter    OCD Daughter     Social History   Socioeconomic History   Marital status: Married    Spouse name: Therapist, art   Number of children: Not on file   Years of education: Not on file   Highest education level: Master's degree (e.g., MA, MS, MEng, MEd, MSW, MBA)  Occupational History   Not on file  Tobacco Use   Smoking status: Never    Passive exposure: Never   Smokeless tobacco: Never  Vaping Use   Vaping status: Never Used  Substance and Sexual Activity   Alcohol use: Yes    Comment: 1 Drink a Month, socially   Drug use: Not Currently   Sexual activity: Yes    Birth control/protection: None  Other Topics Concern   Not on file  Social History Narrative   Tobacco use, amount per day now: None   Past tobacco use, amount per day: None   How many years did you use tobacco: 0   Alcohol use (drinks per week): 0-1 Month   Diet:   Do you drink/eat things with caffeine : Occasionally ( Hot Chocolate and maybe 1/4 of 16oz Pepsi 2-3 times a week.   Marital status: Married                                  What year were you married? 1970   Do you live in a house, apartment, assisted living, condo, trailer, etc.? House   Is it one or more stories? One  How many persons live in your home? 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 Yes                                    Type and how often? [place], Recumbent Bike, 3 times a week. Try to get a 1.2 mile walk at least 3 times a week or more.     Do you have a living will? Yes   Do you have a DNR form?  No                                 If not, do you want to discuss one?   Do you have signed POA/HPOA forms? Yes                       If so, please bring to you appointment    Do you have any difficulty bathing or dressing  yourself? No    Do you have difficulty preparing food or eating? No   Do you have difficulty managing your medications? No   Do you have any difficulty managing your finances? No   Do you have any difficulty affording your medications? No         Social Drivers of Corporate investment banker Strain: Low Risk  (12/24/2023)   Overall Financial Resource Strain (CARDIA)    Difficulty of Paying Living Expenses: Not hard at all  Food Insecurity: No Food Insecurity (12/24/2023)   Hunger Vital Sign    Worried About Running Out of Food in the Last Year: Never true    Ran Out of Food in the Last Year: Never true  Transportation Needs: No Transportation Needs (12/24/2023)   PRAPARE - Administrator, Civil Service (Medical): No    Lack of Transportation (Non-Medical): No  Physical Activity: Insufficiently Active (12/24/2023)   Exercise Vital Sign    Days of Exercise per Week: 3 days    Minutes of Exercise per Session: 30 min  Stress: No Stress Concern Present (12/24/2023)   [employer] of Occupational Health - Occupational Stress Questionnaire    Feeling of Stress : Not at all  Social Connections: Moderately Integrated (12/24/2023)   Social Connection and Isolation Panel [NHANES]    Frequency of Communication with Friends and Family: Twice a week    Frequency of Social Gatherings with Friends and Family: Once a week    Attends Religious Services: 1 to 4 times per year    Active Member of [employer] or Organizations: No    Attends Banker Meetings: Never    Marital Status: Married  Catering manager Violence: Not At Risk (12/24/2023)   Humiliation, Afraid, Rape, and Kick questionnaire    Fear of Current or Ex-Partner: No    Emotionally Abused: No    Physically Abused: No    Sexually Abused: No    Review of Systems: See HPI, otherwise negative ROS  Physical Exam: BP (!) 160/78   Pulse 79   Temp (!) 97.2 F (36.2 C) (Temporal)   Resp 16   Ht 5\' 9"  (1.753 m)  Wt 63.5 kg   SpO2 98%   BMI 20.67 kg/m  General:   Alert,  pleasant and cooperative in NAD Head:  Normocephalic and atraumatic. Neck:  Supple; no masses or thyromegaly. Lungs:  Clear throughout to auscultation, normal respiratory effort.    Heart:  +S1, +S2, Regular rate and rhythm, No edema. Abdomen:  Soft, nontender and nondistended. Normal bowel sounds, without guarding, and without rebound.   Neurologic:  Alert and  oriented x4;  grossly normal neurologically.  Impression/Plan: Grant Young is here for an colonoscopy to be performed for Screening colonoscopy average risk   Risks, benefits, limitations, and alternatives regarding  colonoscopy have been reviewed with the patient.  Questions have been answered.  All parties agreeable.   Grant Salaam, MD  01/26/2024, 10:43 AM

## 2024-01-27 ENCOUNTER — Encounter: Payer: Self-pay | Admitting: Gastroenterology

## 2024-01-28 ENCOUNTER — Telehealth: Payer: Self-pay

## 2024-01-28 NOTE — Telephone Encounter (Signed)
 Spoke with patient to schedule 3 month schedule repeat colonoscopy due to poor bowel prep- he stated he prefers to have DR.Antony Baumgartner and will call [facility] to schedule an appointment- Arnetta Lank number provided.

## 2024-01-31 ENCOUNTER — Other Ambulatory Visit: Payer: Self-pay | Admitting: Internal Medicine

## 2024-01-31 MED ORDER — DIAZEPAM 5 MG PO TABS: ORAL_TABLET | ORAL | 0 refills | Status: DC

## 2024-02-05 ENCOUNTER — Encounter: Payer: Self-pay | Admitting: Internal Medicine

## 2024-02-17 ENCOUNTER — Encounter: Payer: Self-pay | Admitting: Internal Medicine

## 2024-02-17 ENCOUNTER — Ambulatory Visit: Admitting: Internal Medicine

## 2024-02-17 VITALS — BP 150/88 | Ht 69.0 in | Wt 144.2 lb

## 2024-02-17 DIAGNOSIS — R251 Tremor, unspecified: Secondary | ICD-10-CM

## 2024-02-17 DIAGNOSIS — F32A Depression, unspecified: Secondary | ICD-10-CM

## 2024-02-17 DIAGNOSIS — G2581 Restless legs syndrome: Secondary | ICD-10-CM

## 2024-02-17 DIAGNOSIS — M25511 Pain in right shoulder: Secondary | ICD-10-CM

## 2024-02-17 DIAGNOSIS — G43719 Chronic migraine without aura, intractable, without status migrainosus: Secondary | ICD-10-CM

## 2024-02-17 DIAGNOSIS — F419 Anxiety disorder, unspecified: Secondary | ICD-10-CM

## 2024-02-17 DIAGNOSIS — I2510 Atherosclerotic heart disease of native coronary artery without angina pectoris: Secondary | ICD-10-CM

## 2024-02-17 DIAGNOSIS — G8929 Other chronic pain: Secondary | ICD-10-CM

## 2024-02-17 DIAGNOSIS — G5621 Lesion of ulnar nerve, right upper limb: Secondary | ICD-10-CM

## 2024-02-17 DIAGNOSIS — R0989 Other specified symptoms and signs involving the circulatory and respiratory systems: Secondary | ICD-10-CM

## 2024-02-17 DIAGNOSIS — G629 Polyneuropathy, unspecified: Secondary | ICD-10-CM

## 2024-02-17 MED ORDER — BUSPIRONE HCL 15 MG PO TABS: 15.0000 mg | ORAL_TABLET | Freq: Three times a day (TID) | ORAL | 0 refills | Status: DC

## 2024-02-17 NOTE — Progress Notes (Addendum)
 Subjective:    Patient ID: Grant Young, male    DOB: September 02, 1946, 78 y.o.   MRN: [id number]  HPI  Discussed the use of AI scribe software for clinical note transcription with the patient, who gave verbal consent to proceed.   Grant Young is a 78 year old male with tremors and neuropathy who presents for medication management and follow-up on neurological evaluation.  Recent neurological evaluations, including a skin test for Parkinson's, were negative. Skin biopsies from three locations revealed insufficient nerve cells in the lower extremities, leading to a discussion of neuropathy rather than Parkinson's. He experiences tremors and neuropathy, particularly in the lower legs as well as restless legs.  He has been on Valium  for many years, currently taking 2.5 mg in the morning and 5 mg at night. He reports increased anxiety since reducing Valium . 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 He is also managed on escitalopram  for his anxiety.  He was previously on propranolol  for migraines, tremors, and anxiety, which helped with headaches and neuropathy. He was taken off propranolol  and sinemet, as sinemet did not alleviate tremors. He inquires about using propranolol  for headaches and anxiety until starting primidone, which is intended for tremor management recommended by his new neurologist.  He reports a recent episode of numbness in his fingers, which began while sitting in a chair. The numbness persists, and he has a history of rotator cuff injury and a minor tear of the infraspinatus. He is undergoing physical therapy, which has improved his range of motion and strength.  He has a history of basal cell carcinoma, with recent biopsies and removals planned for multiple sites. He is scheduled for several weeks of treatments to address these lesions.  He mentions a past heart attack nearly two years ago and notes that his heart rate and blood  pressure have been elevated since discontinuing propranolol . He is concerned about the impact of medication changes on his cardiovascular health.       Review of Systems     Past Medical History:  Diagnosis Date   Anemia    Aneurysm of ascending aorta (HCC)    a.) CT chest 08/18/2022: 4.1 cm; b.) CT chest 12/07/2022: 4.2 cm   Anxiety    a.) on BZO (diazepam ) PRN   Aortic atherosclerosis (HCC)    Chronic back pain    Coronary artery disease    a.) s/p PCI with DES x 2 (pLCx and o-mLAD) 11/27/2021   Depression    Erectile dysfunction    a.) Bi-mix (papaverine + phentolamine) injections + exogenous testosterone  injections   GERD (gastroesophageal reflux disease)    h/o Lyme disease    Headache    Hiatal hernia    History of bilateral cataract extraction 01/2021   History of gastritis    History of kidney stones    History of neuropathy    Hyperlipidemia LDL goal <70    a.) CAD with NSTEMI -->  intolerant of statins with statin myopathy and memory issues.;  Also intolerant of Repatha    Hypertension    Labile blood pressures but dizziness with blood pressures in the "normal range"; intolerant of most medications including ARB's, ACE inhibitor's, amlodipine most beta-blockers other than propranolol .   Insomnia    a.) takes malatonin PRN   Left lower lobe pulmonary nodule    a.) chest CT 12/07/2022: nodules x 2 posteromedial LLL;  8 x 11 mm  and 8 x 10 mm   Long term current use of antithrombotics/antiplatelets    a.) DAPT (ASA + clopidogrel )   Long term current use of aromatase inhibitor    a,) anastrozole  --> estridiol suppression secondary to exogenous testosterone  use   Lumbar radicular pain    Myocardial infarction (HCC)    a.) MI x 2 - 1990 * 1999 - PTCA   NSTEMI (non-ST elevated myocardial infarction) (HCC) 11/27/2021   a.) LHC 11/28/2021: sequential 75% o-pLAD, 70% OM2, 80% p-mLAD, 99% mLAD, 100% pLCx --> PCI placing a 2.5 x 26 mm Onyx Frontier DES to pLCx and a 2.5 x 24  mm Onyx Frontier DES to the o-m LAD (covering 3 lesions)   Overactive bladder    Pneumonia    PONV (postoperative nausea and vomiting)    Recurrent herpes labialis    a.) has suppressive valacyclovir  to use PRN   Skin cancer, basal cell    Spinal cord stimulator status    01/08/21 - not currently using.   Squamous cell skin cancer    Substance abuse (HCC)     Current Outpatient Medications  Medication Sig Dispense Refill   amitriptyline  (ELAVIL ) 10 MG tablet Take 3 tablets (30 mg total) by mouth at bedtime. 270 tablet 3   anastrozole  (ARIMIDEX ) 1 MG tablet 1/2 tablet (0.5 mg) by mouth once weekly     aspirin  EC 81 MG tablet Take 1 tablet (81 mg total) by mouth daily. Swallow whole.     Butalbital -APAP-Caffeine  50-300-40 MG CAPS Take 1 capsule by mouth every 6 (six) hours as needed. 90 capsule 0   carbidopa-levodopa (SINEMET IR) 25-100 MG tablet 3 (three) times daily.     Cholecalciferol  50 MCG (2000 UT) CAPS Take 1 tablet by mouth daily.     cholestyramine  (QUESTRAN ) 4 g packet as needed (diarrea).     Cyanocobalamin  (VITAMIN B-12) 5000 MCG LOZG Take 1 lozenge by mouth daily.      DHEA 25 MG CAPS Take 25 mg by mouth daily.     diazepam  (VALIUM ) 5 MG tablet TAKE 1 TABLET BY MOUTH EVERY MORNING AND 2 TABLETS EVERY NIGHT AT BEDTIME 90 tablet 0   dicyclomine  (BENTYL ) 10 MG capsule TAKE 1 CAPSULE (10 MG TOTAL) BY MOUTH 4 TIMES A DAY BEFORE MEALS AND AT BEDTIME (Patient taking differently: as needed for spasms.) 90 capsule 1   Digestive Aids Mixture (DIGESTION GB PO) Take 1 capsule by mouth daily.     escitalopram  (LEXAPRO ) 20 MG tablet TAKE 1 TABLET(20 MG) BY MOUTH DAILY 90 tablet 1   ezetimibe -simvastatin  (VYTORIN ) 10-20 MG tablet Take 1 tablet by mouth daily. 90 tablet 3   hydrALAZINE  (APRESOLINE ) 25 MG tablet Take 1 tablet (25 mg total) by mouth 3 (three) times daily. In the morning and at dinner, may take extra tablet for BP greater than 160 270 tablet 3   ipratropium (ATROVENT ) 0.03 %  nasal spray Place 2 sprays into both nostrils every 12 (twelve) hours. 30 mL 12   ketoconazole (NIZORAL) 2 % shampoo Apply 1 Application topically once.     Loperamide  HCl (IMODIUM  PO) Take by mouth as needed.      MAGNESIUM  BISGLYCINATE PO Take by mouth daily.     melatonin 5 MG TABS Take 5 mg by mouth at bedtime.     MIEBO 1.338 GM/ML SOLN Place 1 drop into both eyes 4 (four) times daily.     Na Sulfate-K Sulfate-Mg Sulfate concentrate (SUPREP) 17.5-3.13-1.6 GM/177ML SOLN as directed.  colonoscopy     NONFORMULARY OR COMPOUNDED ITEM Bi-Mix Papaverine 30mg , Phentolamine 1mg    Dosage: Inject 0.25cc-0.5cc as need for ED   Vial 1ml   Qty #5 Refills 6   [facility] [phone] Fax [phone] 5 each 6   Nutritional Supplements (NUTRITIONAL SUPPLEMENT PO) Take 1 capsule by mouth daily. [name]     POTASSIUM PO Take 1 tablet by mouth daily at 6 (six) AM. Takes 5x weekly.     PREVIDENT 5000 DRY MOUTH 1.1 % GEL dental gel SMARTSIG:To Teeth 3 Times Daily     Probiotic Product (PROBIOTIC DAILY PO) Take by mouth daily.     prochlorperazine  (COMPAZINE ) 10 MG tablet TAKE 1 TABLET(10 MG) BY MOUTH TWICE DAILY AS NEEDED FOR NAUSEA OR VOMITING 180 tablet 0   pyridostigmine  (MESTINON ) 60 MG tablet Take 1 tablet (60 mg total) by mouth 2 (two) times daily. 180 tablet 0   RABEprazole  (ACIPHEX ) 20 MG tablet Take 2 tablets (40 mg total) by mouth 2 (two) times daily. 360 tablet 3   sucralfate  (CARAFATE ) 1 g tablet Take 1 tablet (1 g total) by mouth 4 (four) times daily. 360 tablet 3   testosterone  cypionate (DEPOTESTOSTERONE CYPIONATE) 200 MG/ML injection Inject 80 mg into the muscle every 14 (fourteen) days.     tizanidine  (ZANAFLEX ) 2 MG capsule Take 1 capsule (2 mg total) by mouth every 12 (twelve) hours as needed for muscle spasms.     triamcinolone cream (KENALOG) 0.1 % as needed (skin irritation, insect bites).     valACYclovir  (VALTREX ) 1000 MG tablet Take 2 tabs p.o. and  repeat in 12 hours as needed for cold sore 30 tablet 0   No current facility-administered medications for this visit.    Allergies  Allergen Reactions   Ace Inhibitors     Other reaction(s): Cough   Fluoxetine Anxiety    "made me fall asleep" per pt "bad headaches and "makes  Me  Crazy" historical allergy noted in [employer] "made me fall asleep" per pt "bad headaches and "makes  Me  Crazy" Per New Patient Packet.     Metoclopramide     Other reaction(s): Other (See Comments), Other (See Comments), Unknown Tardive Dyskinesia  historical allergy noted in [employer] Tardive Dyskinesia  Per New Patient Packet.   Nalbuphine     Used Post Back surgery- Anesthesiologist Error. Patient had Narcotic Withdraw. Per New Patient Packet.    Other     Other reaction(s): Other (See Comments) Altered mental status in combo with narcotics at previous hospitalization - Full Withdrawal Symptoms Other reaction(s): Rash   Amoxicillin-Pot Clavulanate Nausea Only    Per New Patient Packet.   Doxazosin Rash    Other reaction(s): Other - See Comments, Rash UNKNOWN REACTION UNKNOWN REACTION    Duloxetine Nausea Only    Per New Patient Packet.    Penicillins Nausea Only    Per New Patient Packet.   Tamsulosin Itching and Anxiety    Restless, Flushing, Heavy Chest, Itching, Hyperactive mood and Anxiety. Unable to handle side effects. Per New Patient Packet.     Trazodone And Nefazodone Itching, Anxiety and Rash    Headache. "INCREASED MY ANXIETY AND HEARTRATE" Flushing, tachycardia "INCREASED MY ANXIETY AND HEARTRATE" Per New Patient Packet.    Alirocumab  Other (See Comments)   Amlodipine     Shaking, unsure of reaction. Per New Patient Packet.     Cinoxacin     GI Intolerance, and Dizziness. Per New Patient Packet.  Ciprofloxacin     Other reaction(s): Unknown   Fludrocortisone  Other (See Comments)    "Worsening headaches, GI issues, fatigue"   Nebivolol     Other reaction(s): Unknown    Olanzapine     Headache and unable to sleep for 3 nights. Per New Patient Packet.    Olmesartan     Other reaction(s): Unknown   Pregabalin     Confusion, Lack of concentration, dizziness, and likely drowsiness. Per New Patient Packet.     Prostaglandins     Other reaction(s): Other (See Comments) Intolerance   Thyroid  Hormones     Other reaction(s): Other (See Comments) Thyroid  (Nature Thyroid ) contraindicated with some of your other medications.   Venlafaxine  Other (See Comments)    High blood pressure- hospitalized   venlafaxine    Venlafaxine  Hcl Er Hypertension   Zolpidem     Nightmares, Ineffective after 2 days. Per New Patient Packet.    Duloxetine Hcl     Other reaction(s): Rash   Fluoxetine Hcl     Other reaction(s): Rash   Nucynta  [Tapentadol ] Other (See Comments)    Vertigo    Phenytoin Anxiety    Hyperactivity, and Ineffective. Per New Patient Packet.     Family History  Problem Relation Age of Onset   Heart disease Father    Heart failure Father    Hypertension Father    Stroke Father    Stroke Mother    Dementia Mother    Kidney Stones Daughter    Anxiety disorder Daughter    OCD Daughter     Social History   Socioeconomic History   Marital status: Married    Spouse name: Therapist, art   Number of children: Not on file   Years of education: Not on file   Highest education level: Master's degree (e.g., MA, MS, MEng, MEd, MSW, MBA)  Occupational History   Not on file  Tobacco Use   Smoking status: Never    Passive exposure: Never   Smokeless tobacco: Never  Vaping Use   Vaping status: Never Used  Substance and Sexual Activity   Alcohol use: Yes    Comment: 1 Drink a Month, socially   Drug use: Not Currently   Sexual activity: Yes    Birth control/protection: None  Other Topics Concern   Not on file  Social History Narrative   Tobacco use, amount per day now: None   Past tobacco use, amount per day: None   How many years did you use tobacco:  0   Alcohol use (drinks per week): 0-1 Month   Diet:   Do you drink/eat things with caffeine : Occasionally ( Hot Chocolate and maybe 1/4 of 16oz Pepsi 2-3 times a week.   Marital status: Married                                  What year were you married? 1970   Do you live in a house, apartment, assisted living, condo, trailer, etc.? House   Is it one or more stories? One   How many persons live in your home? 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 Yes  Type and how often? [place], Recumbent Bike, 3 times a week. Try to get a 1.2 mile walk at least 3 times a week or more.     Do you have a living will? Yes   Do you have a DNR form?  No                                 If not, do you want to discuss one?   Do you have signed POA/HPOA forms? Yes                       If so, please bring to you appointment    Do you have any difficulty bathing or dressing yourself? No    Do you have difficulty preparing food or eating? No   Do you have difficulty managing your medications? No   Do you have any difficulty managing your finances? No   Do you have any difficulty affording your medications? No         Social Drivers of Corporate investment banker Strain: Low Risk  (12/24/2023)   Overall Financial Resource Strain (CARDIA)    Difficulty of Paying Living Expenses: Not hard at all  Food Insecurity: No Food Insecurity (12/24/2023)   Hunger Vital Sign    Worried About Running Out of Food in the Last Year: Never true    Ran Out of Food in the Last Year: Never true  Transportation Needs: No Transportation Needs (12/24/2023)   PRAPARE - Administrator, Civil Service (Medical): No    Lack of Transportation (Non-Medical): No  Physical Activity: Insufficiently Active (12/24/2023)   Exercise  Vital Sign    Days of Exercise per Week: 3 days    Minutes of Exercise per Session: 30 min  Stress: No Stress Concern Present (12/24/2023)   [employer] of Occupational Health - Occupational Stress Questionnaire    Feeling of Stress : Not at all  Social Connections: Moderately Integrated (12/24/2023)   Social Connection and Isolation Panel [NHANES]    Frequency of Communication with Friends and Family: Twice a week    Frequency of Social Gatherings with Friends and Family: Once a week    Attends Religious Services: 1 to 4 times per year    Active Member of [employer] or Organizations: No    Attends Banker Meetings: Never    Marital Status: Married  Catering manager Violence: Not At Risk (12/24/2023)   Humiliation, Afraid, Rape, and Kick questionnaire    Fear of Current or Ex-Partner: No    Emotionally Abused: No    Physically Abused: No    Sexually Abused: No     Constitutional: Patient reports chronic fatigue, frequent headaches.  Denies fever, malaise, or abrupt weight changes.  HEENT: Denies eye pain, eye redness, ear pain, ringing in the ears, wax buildup, runny nose, nasal congestion, bloody nose, or sore throat. Respiratory: Pt reports intermittent shortness of breath. Denies difficulty breathing, cough or sputum production.   Cardiovascular: Denies chest pain, chest tightness, palpitations or swelling in the hands or feet.  Gastrointestinal: Patient reports intermittent abdominal cramping, constipation and diarrhea.  Denies abdominal pain, bloating, or blood in the stool.  GU: Patient reports urinary frequency.  Denies urgency, pain with urination, burning sensation, blood in urine, odor or discharge. Musculoskeletal: Patient reports chronic joint pain, difficulty with gait.  Denies decrease in range of motion,  muscle pain or joint swelling.  Skin: Denies redness, rashes, lesions or ulcercations.  Neurological: Patient reports paresthesia of 4th and 5th fingers,  right hand, neuropathic pain, dizziness, restless legs and tremor.  Denies difficulty with memory, difficulty with speech or problems with balance and coordination.  Psych: Patient has a history of anxiety and depression.  Denies SI/HI.  No other specific complaints in a complete review of systems (except as listed in HPI above).  Objective:   Physical Exam  BP (!) 150/88   Ht 5\' 9"  (1.753 m)   Wt 144 lb 3.2 oz (65.4 kg)   BMI 21.29 kg/m     Wt Readings from Last 3 Encounters:  01/26/24 140 lb (63.5 kg)  01/13/24 142 lb (64.4 kg)  12/30/23 143 lb (64.9 kg)    General: Appears his stated age, chronically ill appearing, in NAD. Skin: No rash noted. HEENT: Head: normal shape and size; Eyes: sclera white, no icterus, conjunctiva pink, PERRLA and EOMs intact;  Cardiovascular: Tachcyardic with normal rhythm. S1,S2 noted.  No murmur, rubs or gallops noted. No JVD or BLE edema. No carotid bruits noted. Pulmonary/Chest: Normal effort and positive vesicular breath sounds. No respiratory distress. No wheezes, rales or ronchi noted.  Musculoskeletal: Normal flexion, extension and rotation of the right elbow and right wrist. No joint swelling noted. Gait slow and steady without device. Neurological: Alert and oriented. Cranial nerves II-XII grossly intact. Coordination normal but resting tremor noted in bilateral hands.  Psychiatric: Mood and affect normal. Mildly anxious appearing. Judgment and thought content normal.    BMET    Component Value Date/Time   NA 145 (H) 07/06/2023 1154   K 4.1 07/06/2023 1154   CL 105 07/06/2023 1154   CO2 25 07/06/2023 1154   GLUCOSE 75 07/06/2023 1154   GLUCOSE 92 04/20/2023 1502   BUN 11 07/06/2023 1154   CREATININE 0.84 07/06/2023 1154   CREATININE 0.76 09/18/2022 1025   CALCIUM  9.4 07/06/2023 1154   GFRNONAA >60 04/20/2023 1502   GFRAA 101 05/20/2020 0000    Lipid Panel     Component Value Date/Time   CHOL 108 10/15/2023 0859   TRIG 68  10/15/2023 0859   HDL 43 10/15/2023 0859   CHOLHDL 2.5 10/15/2023 0859   CHOLHDL 3.3 03/26/2023 0629   VLDL 14 03/26/2023 0629   LDLCALC 50 10/15/2023 0859   LDLCALC 103 (H) 11/11/2021 1006    CBC    Component Value Date/Time   WBC 6.2 07/06/2023 1154   WBC 6.8 04/20/2023 1502   RBC 4.95 07/06/2023 1154   RBC 5.10 04/20/2023 1502   HGB 14.8 07/06/2023 1154   HCT 46.5 07/06/2023 1154   PLT 237 07/06/2023 1154   MCV 94 07/06/2023 1154   MCH 29.9 07/06/2023 1154   MCH 30.0 04/20/2023 1502   MCHC 31.8 07/06/2023 1154   MCHC 32.4 04/20/2023 1502   RDW 12.1 07/06/2023 1154   LYMPHSABS 1.6 07/06/2023 1154   MONOABS 1.2 (H) 04/19/2022 0952   EOSABS 0.0 07/06/2023 1154   BASOSABS 0.0 07/06/2023 1154    Hgb A1C Lab Results  Component Value Date   HGBA1C 5.0 07/06/2023           Assessment & Plan:  Assessment and Plan    Tremor Tremors not related to Parkinson's. Neurologist recommended primidone. Avoid concurrent use with propranolol  due to interactions per the neurologist as well as valium  as taking valium  and primidone together can cause CNS depression. - Contact neurologist to confirm  primidone dosing. - Consider propranolol  as needed for headaches and anxiety until primidone starts. -Sinement was discontinued by neurology  Hypertension and Tachycardia Elevated heart rate and blood pressure possibly due to propranolol  withdrawal. History of myocardial infarction suggests need for beta blocker. - Consult cardiologist Dr. Addie Holstein about beta blocker necessity. - Consider alternative beta blockers if propranolol  contraindicated with primidone.  Anxiety and Depression Increased anxiety after self-weaning Valium . Transition to buspirone  recommended to avoid respiratory issues with primidone. - Start buspirone  15 mg TID. - Wean Valium  by half a tablet per week as tolerated. - Monitor anxiety and adjust buspirone  dosage. - Inform neurologist about buspirone  transition  and Valium  discontinuation.  Ulnar Neuropathy Numbness in fingers likely from ulnar neuropathy due to elbow pressure. No strength loss or pain. - Apply ice to elbow for inflammation. - Take ibuprofen 200-400 mg BID with food. - Refer to orthopedist if symptoms persist.    RTC in 2 months for follow-up of chronic conditions Helayne Lo, NP

## 2024-02-17 NOTE — Patient Instructions (Signed)

## 2024-02-18 ENCOUNTER — Encounter: Payer: Self-pay | Admitting: Internal Medicine

## 2024-02-21 ENCOUNTER — Encounter: Payer: Self-pay | Admitting: Internal Medicine

## 2024-02-23 ENCOUNTER — Encounter: Payer: Self-pay | Admitting: Internal Medicine

## 2024-02-29 ENCOUNTER — Telehealth: Payer: Self-pay

## 2024-02-29 NOTE — Telephone Encounter (Signed)
 I just heard back from the workers comp board, level 3 was also denied for the facets. There is nothing else I can do. Patient notified

## 2024-03-21 ENCOUNTER — Telehealth: Payer: Self-pay | Admitting: Student in an Organized Health Care Education/Training Program

## 2024-03-21 ENCOUNTER — Other Ambulatory Visit: Payer: Self-pay | Admitting: Specialist

## 2024-03-21 DIAGNOSIS — R911 Solitary pulmonary nodule: Secondary | ICD-10-CM

## 2024-03-21 MED ORDER — PRIMIDONE 50 MG PO TABS: ORAL_TABLET | ORAL | 1 refills | Status: DC

## 2024-03-21 NOTE — Telephone Encounter (Signed)
 Grant Young from [facility] called stated that she send a fax over for physical therapy to be sign by [employer]. Grant Young stated that she fax paper work over on 02-29-24. Please give Grant Young a call back at [phone] Thanks

## 2024-03-21 NOTE — Telephone Encounter (Signed)
 We have not received the fax. Spoke with Jerel, she will fax it again.

## 2024-03-22 ENCOUNTER — Other Ambulatory Visit: Payer: Self-pay | Admitting: Internal Medicine

## 2024-03-24 ENCOUNTER — Other Ambulatory Visit

## 2024-03-24 NOTE — Telephone Encounter (Signed)
 Requested medication (s) are due for refill today: yes  Requested medication (s) are on the active medication list: yes  Last refill:  12/27/23  Future visit scheduled: no  Notes to clinic:   Medication not assigned to a protocol, review manually.      Requested Prescriptions  Pending Prescriptions Disp Refills   pyridostigmine  (MESTINON ) 60 MG tablet [Pharmacy Med Name: PYRIDOSTIGMINE  60MG  TABLETS] 180 tablet 0    Sig: TAKE 1 TABLET(60 MG) BY MOUTH TWICE DAILY     Off-Protocol Failed - 03/24/2024  1:43 PM      Failed - Medication not assigned to a protocol, review manually.      Passed - Valid encounter within last 12 months    Recent Outpatient Visits           1 month ago Anxiety and depression   [place] [facility] [place], [place] W, NP   4 months ago Intractable chronic migraine without aura and without status migrainosus   [place] [facility] [place], Angeline ORN, NP       Future Appointments             In 1 month [place], Angeline ORN, NP [place] [facility] [place]   In 2 months [place], Redell BROCKS, MD [facility]

## 2024-03-29 ENCOUNTER — Encounter: Payer: Self-pay | Admitting: Urology

## 2024-03-31 ENCOUNTER — Encounter: Payer: Self-pay | Admitting: Internal Medicine

## 2024-03-31 ENCOUNTER — Ambulatory Visit
Admission: RE | Admit: 2024-03-31 | Discharge: 2024-03-31 | Disposition: A | Discharge status: Home / Self Care | Source: Ambulatory Visit | Attending: Specialist | Admitting: Specialist

## 2024-03-31 DIAGNOSIS — R911 Solitary pulmonary nodule: Secondary | ICD-10-CM | POA: Insufficient documentation

## 2024-04-06 ENCOUNTER — Ambulatory Visit (INDEPENDENT_AMBULATORY_CARE_PROVIDER_SITE_OTHER): Admitting: Internal Medicine

## 2024-04-06 ENCOUNTER — Encounter: Payer: Self-pay | Admitting: Internal Medicine

## 2024-04-06 VITALS — BP 150/78 | HR 94 | Ht 69.0 in | Wt 140.0 lb

## 2024-04-06 DIAGNOSIS — K580 Irritable bowel syndrome with diarrhea: Secondary | ICD-10-CM

## 2024-04-06 DIAGNOSIS — K219 Gastro-esophageal reflux disease without esophagitis: Secondary | ICD-10-CM

## 2024-04-06 DIAGNOSIS — R0789 Other chest pain: Secondary | ICD-10-CM

## 2024-04-06 DIAGNOSIS — I2510 Atherosclerotic heart disease of native coronary artery without angina pectoris: Secondary | ICD-10-CM | POA: Diagnosis not present

## 2024-04-06 DIAGNOSIS — E785 Hyperlipidemia, unspecified: Secondary | ICD-10-CM

## 2024-04-06 DIAGNOSIS — R0989 Other specified symptoms and signs involving the circulatory and respiratory systems: Secondary | ICD-10-CM

## 2024-04-06 DIAGNOSIS — K591 Functional diarrhea: Secondary | ICD-10-CM

## 2024-04-06 DIAGNOSIS — R251 Tremor, unspecified: Secondary | ICD-10-CM

## 2024-04-06 DIAGNOSIS — G5690 Unspecified mononeuropathy of unspecified upper limb: Secondary | ICD-10-CM

## 2024-04-06 DIAGNOSIS — G2581 Restless legs syndrome: Secondary | ICD-10-CM

## 2024-04-06 NOTE — Progress Notes (Unsigned)
 Subjective:    Patient ID: Grant Young, male    DOB: [birth year], 78 y.o.   MRN: [id number]  HPI  Discussed the use of AI scribe software for clinical note transcription with the patient, who gave verbal consent to proceed.   Grant Young is a 78 year old male with a complicated history including chronic migraines, labile hypertension, CAD with history of MI, IBS, chronic pain, essential tremor, restless legs, bronchiectasis and chronic mycobacterium avium complex who presents for ER follow-up after experiencing chest and abdominal pain.  He experienced chest pain, abdominal pain, and elevated blood pressure during an ER visit on July 15th. Lab work, including troponins, was normal, and EKG showed a right bundle branch block, left anterior fascicular block, and bifascicular block, consistent with previous findings. A chest x-ray revealed patchy nodularity in the lung bases. He has been seeing a pulmonologist for bronchiectasis and chronic mycobacterium avium complex.  Recent CT at pulmonary follow-up showed:  IMPRESSION: 1. Patchy bronchiectasis, peribronchovascular nodularity, ground-glass and architectural distortion in the lungs bilaterally with some areas of progression, as detailed above. Findings are most indicative of chronic mycobacterium avium complex. 2. 4.0 cm ascending aortic aneurysm, stable. Recommend annual imaging followup by CTA or MRA. This recommendation follows 2010 ACCF/AHA/AATS/ACR/ASA/SCA/SCAI/SIR/STS/SVM Guidelines for the Diagnosis and Management of Patients with Thoracic Aortic Disease. Circulation. 2010; 121: [id number]. Aortic aneurysm NOS (ICD10-I71.9). 3. Small left renal stones. 4. Aortic atherosclerosis (ICD10-I70.0). Coronary artery calcification. 5. Enlarged right and left pulmonary arteries, indicative of pulmonary arterial hypertension.  He has ongoing abdominal pain with diarrhea, described as gurgling intestines leading to [facility] scale type 6 stools. He  has a history of irritable bowel syndrome. He takes cholecystiramine daily, which does not seem effective, and uses Imodium  and Bentyl  sparingly. A colonoscopy in May was incomplete due to excessive stool, and an endoscopy in March revealed three small ulcers. He takes Aciphex  twice daily and Carafate  four times daily for stomach pain.  His blood pressure remains elevated, with a BP reading of 152/80 today. He takes hydralazine  25 mg three times daily, with an extra dose if needed. He has experienced side effects from other blood pressure medications, including ACE inhibitors, olmesartan and amlodipine. He reports a sensation of his heart pounding, particularly in the chest, and notes that this has been more noticeable since stopping propranolol .  His propranolol  was discontinued by neurology because they wanted to start him on primidone  after he failed carbidopa-levodopa for treatment of his essential tremor. He experiences tremors, primarily in his upper extremities but also in his legs, diagnosed as restless leg syndrome.  He has not noticed that the primidone  has helped his tremors as of yet.  He describes vision changes as cloudy vision with shadows, and he wears reading glasses. He last saw an eye doctor in December, who noted cloudiness in the left eye fluid but no cataracts, macular degeneration or glaucoma.  He does wear readers that are not prescription.  He has a history of neuropathic leg pain, described as burning and numbness, with a sensation of tightness around the legs and toes. He has tried various neuropathy medications, including gabapentin  and lyrica, without relief.  He has heard of a new medication called jourvacx, and nonnarcotic pain medication and is wondering if this is something he would be able to take.  He had previously been on hydrocodone  in the past.  He reports headaches and a feeling of fogginess, which he associates with his elevated blood pressure. He  has been off valium   since July 5th and previously tried buspar , which was stopped due to diarrhea.  Although he does report that the diarrhea has not improved since stopping the BuSpar .  He has a history of ulnar nerve issues with numbness, for which he was advised to follow up with a nerve conduction study if physical therapy exercises were ineffective.       Review of Systems     Past Medical History:  Diagnosis Date   Anemia    Aneurysm of ascending aorta (HCC)    a.) CT chest 08/18/2022: 4.1 cm; b.) CT chest 12/07/2022: 4.2 cm   Anxiety    a.) on BZO (diazepam ) PRN   Aortic atherosclerosis (HCC)    Chronic back pain    Coronary artery disease    a.) s/p PCI with DES x 2 (pLCx and o-mLAD) 11/27/2021   Depression    Erectile dysfunction    a.) Bi-mix (papaverine + phentolamine) injections + exogenous testosterone  injections   GERD (gastroesophageal reflux disease)    h/o Lyme disease    Headache    Hiatal hernia    History of bilateral cataract extraction 01/2021   History of gastritis    History of kidney stones    History of neuropathy    Hyperlipidemia LDL goal <70    a.) CAD with NSTEMI -->  intolerant of statins with statin myopathy and memory issues.;  Also intolerant of Repatha    Hypertension    Labile blood pressures but dizziness with blood pressures in the normal range; intolerant of most medications including ARB's, ACE inhibitor's, amlodipine most beta-blockers other than propranolol .   Insomnia    a.) takes malatonin PRN   Left lower lobe pulmonary nodule    a.) chest CT 12/07/2022: nodules x 2 posteromedial LLL;  8 x 11 mm and 8 x 10 mm   Long term current use of antithrombotics/antiplatelets    a.) DAPT (ASA + clopidogrel )   Long term current use of aromatase inhibitor    a,) anastrozole  --> estridiol suppression secondary to exogenous testosterone  use   Lumbar radicular pain    Myocardial infarction (HCC)    a.) MI x 2 - 1990 * 1999 - PTCA   NSTEMI (non-ST elevated  myocardial infarction) (HCC) 11/27/2021   a.) LHC 11/28/2021: sequential 75% o-pLAD, 70% OM2, 80% p-mLAD, 99% mLAD, 100% pLCx --> PCI placing a 2.5 x 26 mm Onyx Frontier DES to pLCx and a 2.5 x 24 mm Onyx Frontier DES to the o-m LAD (covering 3 lesions)   Overactive bladder    Pneumonia    PONV (postoperative nausea and vomiting)    Recurrent herpes labialis    a.) has suppressive valacyclovir  to use PRN   Skin cancer, basal cell    Spinal cord stimulator status    01/08/21 - not currently using.   Squamous cell skin cancer    Substance abuse (HCC)     Current Outpatient Medications  Medication Sig Dispense Refill   amitriptyline  (ELAVIL ) 10 MG tablet Take 3 tablets (30 mg total) by mouth at bedtime. 270 tablet 3   anastrozole  (ARIMIDEX ) 1 MG tablet 1/2 tablet (0.5 mg) by mouth once weekly     aspirin  EC 81 MG tablet Take 1 tablet (81 mg total) by mouth daily. Swallow whole.     busPIRone  (BUSPAR ) 15 MG tablet Take 1 tablet (15 mg total) by mouth 3 (three) times daily. 270 tablet 0   Butalbital -APAP-Caffeine  50-300-40 MG CAPS Take  1 capsule by mouth every 6 (six) hours as needed. 90 capsule 0   Cholecalciferol  50 MCG (2000 UT) CAPS Take 1 tablet by mouth daily.     cholestyramine  (QUESTRAN ) 4 g packet as needed (diarrea).     Cyanocobalamin  (VITAMIN B-12) 5000 MCG LOZG Take 1 lozenge by mouth daily.      DHEA 25 MG CAPS Take 25 mg by mouth daily.     diazepam  (VALIUM ) 5 MG tablet TAKE 1 TABLET BY MOUTH EVERY MORNING AND 2 TABLETS EVERY NIGHT AT BEDTIME 90 tablet 0   dicyclomine  (BENTYL ) 10 MG capsule TAKE 1 CAPSULE (10 MG TOTAL) BY MOUTH 4 TIMES A DAY BEFORE MEALS AND AT BEDTIME (Patient taking differently: as needed for spasms.) 90 capsule 1   Digestive Aids Mixture (DIGESTION GB PO) Take 1 capsule by mouth daily.     escitalopram  (LEXAPRO ) 20 MG tablet TAKE 1 TABLET(20 MG) BY MOUTH DAILY 90 tablet 1   ezetimibe -simvastatin  (VYTORIN ) 10-20 MG tablet Take 1 tablet by mouth daily. 90  tablet 3   hydrALAZINE  (APRESOLINE ) 25 MG tablet Take 1 tablet (25 mg total) by mouth 3 (three) times daily. In the morning and at dinner, may take extra tablet for BP greater than 160 270 tablet 3   ipratropium (ATROVENT ) 0.03 % nasal spray Place 2 sprays into both nostrils every 12 (twelve) hours. 30 mL 12   ketoconazole (NIZORAL) 2 % shampoo Apply 1 Application topically once.     Loperamide  HCl (IMODIUM  PO) Take by mouth as needed.      MAGNESIUM  BISGLYCINATE PO Take by mouth daily.     melatonin 5 MG TABS Take 5 mg by mouth at bedtime.     MIEBO 1.338 GM/ML SOLN Place 1 drop into both eyes 4 (four) times daily.     NONFORMULARY OR COMPOUNDED ITEM Bi-Mix Papaverine 30mg , Phentolamine 1mg    Dosage: Inject 0.25cc-0.5cc as need for ED   Vial 1ml   Qty #5 Refills 6   [facility] [phone] Fax [phone] 5 each 6   Nutritional Supplements (NUTRITIONAL SUPPLEMENT PO) Take 1 capsule by mouth daily. [name]     POTASSIUM PO Take 1 tablet by mouth daily at 6 (six) AM. Takes 5x weekly.     PREVIDENT 5000 DRY MOUTH 1.1 % GEL dental gel SMARTSIG:To Teeth 3 Times Daily     primidone  (MYSOLINE ) 50 MG tablet Take 0.5 tablets (25 mg total) by mouth at bedtime for 7 days, THEN 1 tablet (50 mg total) at bedtime. 90 tablet 1   Probiotic Product (PROBIOTIC DAILY PO) Take by mouth daily.     prochlorperazine  (COMPAZINE ) 10 MG tablet TAKE 1 TABLET(10 MG) BY MOUTH TWICE DAILY AS NEEDED FOR NAUSEA OR VOMITING 180 tablet 0   pyridostigmine  (MESTINON ) 60 MG tablet TAKE 1 TABLET(60 MG) BY MOUTH TWICE DAILY 180 tablet 0   RABEprazole  (ACIPHEX ) 20 MG tablet Take 2 tablets (40 mg total) by mouth 2 (two) times daily. 360 tablet 3   sucralfate  (CARAFATE ) 1 g tablet Take 1 tablet (1 g total) by mouth 4 (four) times daily. 360 tablet 3   testosterone  cypionate (DEPOTESTOSTERONE CYPIONATE) 200 MG/ML injection Inject 80 mg into the muscle every 14 (fourteen) days.     tizanidine   (ZANAFLEX ) 2 MG capsule Take 1 capsule (2 mg total) by mouth every 12 (twelve) hours as needed for muscle spasms.     triamcinolone cream (KENALOG) 0.1 % as needed (skin irritation, insect bites).  valACYclovir  (VALTREX ) 1000 MG tablet Take 2 tabs p.o. and repeat in 12 hours as needed for cold sore 30 tablet 0   No current facility-administered medications for this visit.    Allergies  Allergen Reactions   Ace Inhibitors     Other reaction(s): Cough   Fluoxetine Anxiety    made me fall asleep per pt bad headaches and makes  Me  Crazy historical allergy noted in [employer] made me fall asleep per pt bad headaches and makes  Me  Crazy Per New Patient Packet.     Metoclopramide     Other reaction(s): Other (See Comments), Other (See Comments), Unknown Tardive Dyskinesia  historical allergy noted in [employer] Tardive Dyskinesia  Per New Patient Packet.   Nalbuphine     Used Post Back surgery- Anesthesiologist Error. Patient had Narcotic Withdraw. Per New Patient Packet.    Other     Other reaction(s): Other (See Comments) Altered mental status in combo with narcotics at previous hospitalization - Full Withdrawal Symptoms Other reaction(s): Rash   Amoxicillin-Pot Clavulanate Nausea Only    Per New Patient Packet.   Doxazosin Rash    Other reaction(s): Other - See Comments, Rash UNKNOWN REACTION UNKNOWN REACTION    Duloxetine Nausea Only    Per New Patient Packet.    Penicillins Nausea Only    Per New Patient Packet.   Tamsulosin Itching and Anxiety    Restless, Flushing, Heavy Chest, Itching, Hyperactive mood and Anxiety. Unable to handle side effects. Per New Patient Packet.     Trazodone And Nefazodone Itching, Anxiety and Rash    Headache. INCREASED MY ANXIETY AND HEARTRATE Flushing, tachycardia INCREASED MY ANXIETY AND HEARTRATE Per New Patient Packet.    Alirocumab  Other (See Comments)   Amlodipine     Shaking, unsure of reaction. Per New Patient  Packet.     Cinoxacin     GI Intolerance, and Dizziness. Per New Patient Packet.    Ciprofloxacin     Other reaction(s): Unknown   Fludrocortisone  Other (See Comments)    Worsening headaches, GI issues, fatigue   Nebivolol     Other reaction(s): Unknown   Olanzapine     Headache and unable to sleep for 3 nights. Per New Patient Packet.    Olmesartan     Other reaction(s): Unknown   Pregabalin     Confusion, Lack of concentration, dizziness, and likely drowsiness. Per New Patient Packet.     Prostaglandins     Other reaction(s): Other (See Comments) Intolerance   Thyroid  Hormones     Other reaction(s): Other (See Comments) Thyroid  (Nature Thyroid ) contraindicated with some of your other medications.   Venlafaxine  Other (See Comments)    High blood pressure- hospitalized   venlafaxine    Venlafaxine  Hcl Er Hypertension   Zolpidem     Nightmares, Ineffective after 2 days. Per New Patient Packet.    Duloxetine Hcl     Other reaction(s): Rash   Fluoxetine Hcl     Other reaction(s): Rash   Nucynta  [Tapentadol ] Other (See Comments)    Vertigo    Phenytoin Anxiety    Hyperactivity, and Ineffective. Per New Patient Packet.     Family History  Problem Relation Age of Onset   Heart disease Father    Heart failure Father    Hypertension Father    Stroke Father    Stroke Mother    Dementia Mother    Kidney Stones Daughter    Anxiety disorder Daughter    OCD  Daughter     Social History   Socioeconomic History   Marital status: Married    Spouse name: Therapist, art   Number of children: Not on file   Years of education: Not on file   Highest education level: Master's degree (e.g., MA, MS, MEng, MEd, MSW, MBA)  Occupational History   Not on file  Tobacco Use   Smoking status: Never    Passive exposure: Never   Smokeless tobacco: Never  Vaping Use   Vaping status: Never Used  Substance and Sexual Activity   Alcohol use: Yes    Comment: 1 Drink a Month, socially   Drug  use: Not Currently   Sexual activity: Yes    Birth control/protection: None  Other Topics Concern   Not on file  Social History Narrative   Tobacco use, amount per day now: None   Past tobacco use, amount per day: None   How many years did you use tobacco: 0   Alcohol use (drinks per week): 0-1 Month   Diet:   Do you drink/eat things with caffeine : Occasionally ( Hot Chocolate and maybe 1/4 of 16oz Pepsi 2-3 times a week.   Marital status: Married                                  What year were you married? 1970   Do you live in a house, apartment, assisted living, condo, trailer, etc.? House   Is it one or more stories? One   How many persons live in your home? 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 Yes                                    Type and how often? [place], Recumbent Bike, 3 times a week. Try to get a 1.2 mile walk at least 3 times a week or more.     Do you have a living will? Yes   Do you have a DNR form?  No                                 If not, do you want to discuss one?   Do you have signed POA/HPOA forms? Yes                       If so, please bring to you appointment    Do you have any difficulty bathing or dressing yourself? No    Do you have difficulty preparing food or eating? No   Do you have difficulty managing your medications? No   Do you have any difficulty managing your finances? No   Do you have any difficulty affording your medications? No         Social Drivers of Corporate investment banker Strain: Low Risk  (12/24/2023)   Overall Financial Resource Strain (CARDIA)    Difficulty of Paying Living Expenses: Not hard at all  Food Insecurity: No Food Insecurity (12/24/2023)   Hunger Vital Sign    Worried About Running Out of Food in the  Last Year: Never true    Ran Out of Food in  the Last Year: Never true  Transportation Needs: No Transportation Needs (12/24/2023)   PRAPARE - Administrator, Civil Service (Medical): No    Lack of Transportation (Non-Medical): No  Physical Activity: Insufficiently Active (12/24/2023)   Exercise Vital Sign    Days of Exercise per Week: 3 days    Minutes of Exercise per Session: 30 min  Stress: No Stress Concern Present (12/24/2023)   [employer] of Occupational Health - Occupational Stress Questionnaire    Feeling of Stress : Not at all  Social Connections: Moderately Integrated (12/24/2023)   Social Connection and Isolation Panel    Frequency of Communication with Friends and Family: Twice a week    Frequency of Social Gatherings with Friends and Family: Once a week    Attends Religious Services: 1 to 4 times per year    Active Member of [employer] or Organizations: No    Attends Banker Meetings: Never    Marital Status: Married  Catering manager Violence: Not At Risk (12/24/2023)   Humiliation, Afraid, Rape, and Kick questionnaire    Fear of Current or Ex-Partner: No    Emotionally Abused: No    Physically Abused: No    Sexually Abused: No     Constitutional: Patient reports chronic fatigue, frequent headaches.  Denies fever, malaise, or abrupt weight changes.  HEENT: Patient reports vision changes.  Denies eye pain, eye redness, ear pain, ringing in the ears, wax buildup, runny nose, nasal congestion, bloody nose, or sore throat. Respiratory: Pt reports intermittent shortness of breath. Denies difficulty breathing, cough or sputum production.   Cardiovascular: Denies chest pain, chest tightness, palpitations or swelling in the hands or feet.  Gastrointestinal: Patient reports intermittent abdominal cramping, and diarrhea.  Denies abdominal pain, bloating, constipation or blood in the stool.  GU: Patient reports urinary frequency.  Denies urgency, pain with urination, burning sensation, blood in urine,  odor or discharge. Musculoskeletal: Patient reports chronic joint pain.  Denies decrease in range of motion, difficulty with gait, muscle pain or joint swelling.  Skin: Denies redness, rashes, lesions or ulcercations.  Neurological: Patient reports paresthesia of 4th and 5th fingers, right hand, neuropathic pain in lower extremities, fogginess, restless legs and tremor.  Denies difficulty with memory, difficulty with speech or problems with balance and coordination.  Psych: Patient has a history of anxiety and depression.  Denies SI/HI.  No other specific complaints in a complete review of systems (except as listed in HPI above).  Objective:   Physical Exam BP (!) 150/78   Pulse 94   Ht 5' 9 (1.753 m)   Wt 140 lb (63.5 kg)   SpO2 95%   BMI 20.67 kg/m      Wt Readings from Last 3 Encounters:  02/17/24 144 lb 3.2 oz (65.4 kg)  01/26/24 140 lb (63.5 kg)  01/13/24 142 lb (64.4 kg)    General: Appears his stated age, chronically ill appearing, in NAD. Skin: No rash noted. HEENT: Head: normal shape and size;  Cardiovascular: Normal rate and rhythm. S1,S2 noted.  No murmur, rubs or gallops noted. No JVD or BLE edema. No carotid bruits noted. Pulmonary/Chest: Normal effort and positive vesicular breath sounds. No respiratory distress. No wheezes, rales or ronchi noted.  Musculoskeletal: Gait slow and steady without device. Neurological: Alert and oriented. Cranial nerves II-XII grossly intact. Coordination normal but resting tremor noted in bilateral hands.  Psychiatric: Mood and  affect normal.  Behavior is normal. Judgment and thought content normal.    BMET    Component Value Date/Time   NA 145 (H) 07/06/2023 1154   K 4.1 07/06/2023 1154   CL 105 07/06/2023 1154   CO2 25 07/06/2023 1154   GLUCOSE 75 07/06/2023 1154   GLUCOSE 92 04/20/2023 1502   BUN 11 07/06/2023 1154   CREATININE 0.84 07/06/2023 1154   CREATININE 0.76 09/18/2022 1025   CALCIUM  9.4 07/06/2023 1154    GFRNONAA >60 04/20/2023 1502   GFRAA 101 05/20/2020 0000    Lipid Panel     Component Value Date/Time   CHOL 108 10/15/2023 0859   TRIG 68 10/15/2023 0859   HDL 43 10/15/2023 0859   CHOLHDL 2.5 10/15/2023 0859   CHOLHDL 3.3 03/26/2023 0629   VLDL 14 03/26/2023 0629   LDLCALC 50 10/15/2023 0859   LDLCALC 103 (H) 11/11/2021 1006    CBC    Component Value Date/Time   WBC 6.2 07/06/2023 1154   WBC 6.8 04/20/2023 1502   RBC 4.95 07/06/2023 1154   RBC 5.10 04/20/2023 1502   HGB 14.8 07/06/2023 1154   HCT 46.5 07/06/2023 1154   PLT 237 07/06/2023 1154   MCV 94 07/06/2023 1154   MCH 29.9 07/06/2023 1154   MCH 30.0 04/20/2023 1502   MCHC 31.8 07/06/2023 1154   MCHC 32.4 04/20/2023 1502   RDW 12.1 07/06/2023 1154   LYMPHSABS 1.6 07/06/2023 1154   MONOABS 1.2 (H) 04/19/2022 0952   EOSABS 0.0 07/06/2023 1154   BASOSABS 0.0 07/06/2023 1154    Hgb A1C Lab Results  Component Value Date   HGBA1C 5.0 07/06/2023           Assessment & Plan:   Assessment and Plan    ER follow-up for Chest Pain and Abdominal Pain Intermittent chest and abdominal pain with gurgling and diarrhea. No acute cardiac issues; chronic right bundle branch block, left anterior fascicular block, and bifascicular block. Patchy nodularity in lung bases consistent with bronchiectasis.  ER notes, labs and imaging reviewed - He currently follows with cardiology and GI - We are considering stopping all nonessential medications but will need to consult with Pharm.D. and [facility] rehab as well as all consulting physicians - Continue current medications for now  Irritable Bowel Syndrome (IBS) Chronic diarrhea with gurgling and abdominal discomfort. Incomplete colonoscopy due to stool presence. Limited effect from cholecystiramine. Discussed Imodium  for severe diarrhea and limitations of current treatments. - Continue cholecystiramine daily. - Consider using Imodium  for severe diarrhea. -Continue Bentyl  as  needed - Order GI pathogen panel, stool culture, and ova and parasite tests.  Hypertension Blood pressure elevated at 152/80 mmHg in office. On hydralazine  25 mg TID with extra dose if needed. Previous side effects from ACE inhibitors, olmesartan and amlodipine. Propranolol  stopped per neurologist's recommendation. Discussed risks of increasing hydralazine  dose. - Increase hydralazine  to 50 mg TID if blood pressure remains elevated. - Check blood pressure in the morning before medications and two hours after extra hydralazine  dose. - Send message with blood pressure readings after extra dose.  Chronic Mycobacterium Avium Complex Diagnosed with chronic mycobacterium avium complex in lungs. Pulmonologist advised against long-term antibiotics due to potential side effects. - Follow up with pulmonologist for management.  Essential Tremor Tremors primarily in legs, previously managed with propranolol  and valium . Currently on primidone , recently increased to 50 mg daily. No significant improvement noted. Neurologist advised against using propranolol  with primidone . - Continue primidone  at 50 mg  daily. - If no improvement in tremors after a week, contact neurologist for further management.  Neuropathy Chronic leg pain with burning, numbness, and tightness, suggestive of neuropathic pain. Previous trials of gabapentin  were ineffective. - Research Jorvax for potential use in neuropathic pain.  Vision Changes Cloudy vision with shadows, possibly related to previous cataract surgery. Last eye exam showed cloudy fluid in left eye. - Follow up with eye doctor in December.  Medication Management Complex medication regimen with potential side effects and interactions. Consideration of a medication holiday to reassess symptoms and side effects. - Discuss with pharmacist to ensure safety of medication holiday. - Contact [facility] to discuss potential monitoring during medication holiday. - Consider  stopping all medications except hydralazine , lexapro , aspirin , vytorin , and aciphex  once daily, with close monitoring.       RTC in 1 months for follow-up of chronic conditions Angeline Laura, NP

## 2024-04-07 ENCOUNTER — Encounter: Payer: Self-pay | Admitting: Internal Medicine

## 2024-04-07 NOTE — Patient Instructions (Signed)

## 2024-04-13 ENCOUNTER — Ambulatory Visit: Payer: Self-pay | Admitting: Internal Medicine

## 2024-04-14 LAB — GASTROINTESTINAL PATHOGEN PNL
CampyloBacter Group: NOT DETECTED
Norovirus GI/GII: NOT DETECTED
Rotavirus A: NOT DETECTED
Salmonella species: NOT DETECTED
Shiga Toxin 1: NOT DETECTED
Shiga Toxin 2: NOT DETECTED
Shigella Species: NOT DETECTED
Vibrio Group: NOT DETECTED
Yersinia enterocolitica: NOT DETECTED

## 2024-04-14 LAB — OVA AND PARASITE EXAMINATION
CONCENTRATE RESULT:: NONE SEEN
MICRO NUMBER:: 16754338
SPECIMEN QUALITY:: ADEQUATE
TRICHROME RESULT:: NONE SEEN

## 2024-04-16 ENCOUNTER — Encounter: Payer: Self-pay | Admitting: Internal Medicine

## 2024-04-20 ENCOUNTER — Encounter: Payer: Self-pay | Admitting: Internal Medicine

## 2024-04-27 ENCOUNTER — Ambulatory Visit: Payer: Medicare Other | Admitting: Internal Medicine

## 2024-04-28 ENCOUNTER — Ambulatory Visit: Admitting: Internal Medicine

## 2024-04-28 ENCOUNTER — Encounter: Payer: Self-pay | Admitting: Internal Medicine

## 2024-04-28 VITALS — BP 138/78 | HR 63 | Ht 69.0 in | Wt 139.2 lb

## 2024-04-28 DIAGNOSIS — I951 Orthostatic hypotension: Secondary | ICD-10-CM

## 2024-04-28 DIAGNOSIS — R739 Hyperglycemia, unspecified: Secondary | ICD-10-CM | POA: Diagnosis not present

## 2024-04-28 DIAGNOSIS — E785 Hyperlipidemia, unspecified: Secondary | ICD-10-CM | POA: Diagnosis not present

## 2024-04-28 DIAGNOSIS — I2510 Atherosclerotic heart disease of native coronary artery without angina pectoris: Secondary | ICD-10-CM

## 2024-04-28 DIAGNOSIS — G8929 Other chronic pain: Secondary | ICD-10-CM

## 2024-04-28 DIAGNOSIS — N3281 Overactive bladder: Secondary | ICD-10-CM

## 2024-04-28 DIAGNOSIS — K219 Gastro-esophageal reflux disease without esophagitis: Secondary | ICD-10-CM

## 2024-04-28 DIAGNOSIS — E349 Endocrine disorder, unspecified: Secondary | ICD-10-CM

## 2024-04-28 DIAGNOSIS — R251 Tremor, unspecified: Secondary | ICD-10-CM

## 2024-04-28 DIAGNOSIS — G5690 Unspecified mononeuropathy of unspecified upper limb: Secondary | ICD-10-CM

## 2024-04-28 DIAGNOSIS — G43719 Chronic migraine without aura, intractable, without status migrainosus: Secondary | ICD-10-CM | POA: Diagnosis not present

## 2024-04-28 DIAGNOSIS — Z8619 Personal history of other infectious and parasitic diseases: Secondary | ICD-10-CM

## 2024-04-28 DIAGNOSIS — F419 Anxiety disorder, unspecified: Secondary | ICD-10-CM

## 2024-04-28 DIAGNOSIS — M47816 Spondylosis without myelopathy or radiculopathy, lumbar region: Secondary | ICD-10-CM

## 2024-04-28 DIAGNOSIS — A6920 Lyme disease, unspecified: Secondary | ICD-10-CM

## 2024-04-28 DIAGNOSIS — F32A Depression, unspecified: Secondary | ICD-10-CM

## 2024-04-28 DIAGNOSIS — K582 Mixed irritable bowel syndrome: Secondary | ICD-10-CM

## 2024-04-28 DIAGNOSIS — R0989 Other specified symptoms and signs involving the circulatory and respiratory systems: Secondary | ICD-10-CM

## 2024-04-28 DIAGNOSIS — G4733 Obstructive sleep apnea (adult) (pediatric): Secondary | ICD-10-CM

## 2024-04-28 DIAGNOSIS — F5101 Primary insomnia: Secondary | ICD-10-CM

## 2024-04-28 DIAGNOSIS — G629 Polyneuropathy, unspecified: Secondary | ICD-10-CM

## 2024-04-28 NOTE — Progress Notes (Signed)
 Subjective:    Patient ID: Grant Young, male    DOB: August 07, 1946, 78 y.o.   MRN: [id number]  HPI  Patient presents to clinic today for follow-up of chronic conditions.  Discussed the use of AI scribe software for clinical note transcription with the patient, who gave verbal consent to proceed.  Anxiety and depression: Chronic, managed on escitalopram  but he weaned himself off diazepam . He is having more trouble sleeping. He is not currently seeing a therapist.  He denies SI/HI.  Migraines/frequent headaches: These occur about 1 x week.  He is not able to identify his triggers.  He is taking amitriptyline , propranolol  as prescribed. He has fioricet  to use as needed for breakthrough but admits that he does not use this after recommendation from neurology to stop it. He follows with neurology.  HTN: His BP today is 142/80.  He is taking hydralazine , propranolol  and taking mestinon  as prescribed. He recently started weaning his mestinon  to see if this would help control his elevated blood pressures and he denies any orthostasis. ECG from 04/2022 reviewed. He does follow with cardiology  HLD with CAD status post MI: His last LDL was 50, triglycerides 68, 09/2023.  He is taking ezetimibe , simvastatin , propranolol , aspirin  as prescribed. He is no longer take plavix . He tries to consume a low-fat diet.  Chronic back/right shoulder pain/neuropathy: Status post multiple back surgeries and spinal cord stimulator placement. He is no longer taking hydrocodone  but is taking tylenol  OTC. He has no interest in undergoing surgery to have his spinal cord stimulator removed. He feels like pain and sleep are currently his biggest issues. He would like to be prescribed something to help better manage his pain. He recently underwent EMG testing and is scheduled to followup to discuss this next week. He no longer follows with pain management.  History of cold sores: He denies recent outbreak.  He takes valacyclovir   only as needed.  GERD: He is not sure what triggers this.  He has daily abdominal pain. He has frequent breakthrough on rabeprazole  and carafate  for which he takes famotidine and gaviscon OTC as needed, often using this daily. He has recently weaned his carafate  down to 2 tablets. Upper GI from 07/2023 reviewed which showed an ulcer at that time. He follows with GI.  Hypotestosteronism: Managed with testosterone  injections and arimidex .  He follows with integrative medicine for this.  IBS: Chronic abdominal pain and alternating constipation and diarrhea.  He manages this with questran , hyoscyamine, mirilax and imodium  as needed. He has bentyl  but feels like the hyscyamine is more effective. He follows with GI.  History of lyme disease: He is not currently taking any medications for this but did under go a 10 month treatment in the past. 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 He is unsure of how much his lyme disease is currently effecting him and his chronic medical issues. He follows with integrative medication for this.  OAB: Mainly urinary frequency.  He is not taking any medications for this at this time.  He follows with urology.  Tremor: He recently came of sinemet and went back on propranolol . He recently cancelled his followup appt with [facility] because he went there to discuss a brain stimulator to see if that would help with his tremors but he was advised against this.  Insomina: He is having trouble falling asleep.Once he is asleep, he can usually stay asleep. This is worse since he weaned himself off valium . He is currently taking melatonin, 20 mg amitriptyline   which he has recently decreased from 30 mg. He has failed ambien and trazodone in the past. Sleep study from 02/2022 reviewed. He reports history of mild sleep apnea but would not be interested in wearing a CPAP machine to see if this would improve his insomnia.  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   Kidney Stones   Clotting disorder (HCC) 01-2022   Plavix    Coronary artery disease    a.) s/p PCI with DES x 2 (pLCx and o-mLAD) 11/27/2021   Depression    Erectile dysfunction    a.) Bi-mix (papaverine + phentolamine) injections + exogenous testosterone  injections   GERD (gastroesophageal reflux disease)    h/o Lyme disease    Headache    Hiatal hernia    History of bilateral cataract extraction 01/2021   History of gastritis    History of kidney stones    History of neuropathy    Hyperlipidemia LDL goal <70    a.) CAD with NSTEMI -->  intolerant of statins with statin myopathy and memory issues.;  Also intolerant of Repatha    Hypertension    Labile blood pressures but dizziness with blood pressures in the normal range; intolerant of most medications including ARB's, ACE inhibitor's, amlodipine most beta-blockers other than propranolol .   Insomnia    a.) takes malatonin PRN   Left lower lobe pulmonary nodule    a.) chest CT 12/07/2022: nodules x 2 posteromedial LLL;  8 x 11 mm and 8 x 10 mm   Long term current use of antithrombotics/antiplatelets    a.) DAPT (ASA + clopidogrel )   Long term current use of aromatase inhibitor    a,) anastrozole  --> estridiol suppression secondary to exogenous testosterone  use   Lumbar radicular pain    Myocardial infarction (HCC)    a.) MI x 2 - 1990 * 1999 - PTCA   NSTEMI (non-ST elevated myocardial infarction) (HCC) 11/27/2021   a.) LHC 11/28/2021: sequential 75% o-pLAD, 70% OM2, 80% p-mLAD, 99% mLAD, 100% pLCx --> PCI placing a 2.5 x 26 mm Onyx Frontier DES to pLCx and a 2.5 x 24 mm Onyx Frontier DES to the o-m LAD (covering 3 lesions)   Overactive bladder    Pneumonia    PONV (postoperative  nausea and vomiting)    Recurrent herpes labialis    a.) has suppressive valacyclovir  to use PRN   Skin cancer, basal cell    Spinal cord stimulator status    01/08/21 - not currently using.   Squamous cell skin cancer    Substance abuse (HCC)    Syncope and collapse 2022    Current Outpatient Medications  Medication Sig Dispense Refill   amitriptyline  (ELAVIL ) 10 MG tablet Take 3 tablets (30 mg total) by mouth at bedtime. 270 tablet 3   anastrozole  (ARIMIDEX ) 1 MG tablet 1/2 tablet (0.5 mg) by mouth once weekly     aspirin  EC 81 MG tablet Take 1 tablet (81 mg total) by mouth daily. Swallow whole.     Butalbital -APAP-Caffeine  50-300-40 MG CAPS Take 1 capsule by mouth every 6 (six) hours as  needed. 90 capsule 0   carbidopa-levodopa (SINEMET IR) 25-100 MG tablet TAKE 1 TABLET BY MOUTH THREE TIMES DAILY. FOLLOW THE SLOW TITRATION SCHEDULE AS PER YOUR PROVIDER     Cholecalciferol  50 MCG (2000 UT) CAPS Take 1 tablet by mouth daily.     cholestyramine  (QUESTRAN ) 4 g packet as needed (diarrea).     Cyanocobalamin  (VITAMIN B-12) 5000 MCG LOZG Take 1 lozenge by mouth daily.      DHEA 25 MG CAPS Take 25 mg by mouth daily.     dicyclomine  (BENTYL ) 10 MG capsule TAKE 1 CAPSULE (10 MG TOTAL) BY MOUTH 4 TIMES A DAY BEFORE MEALS AND AT BEDTIME (Patient taking differently: as needed for spasms.) 90 capsule 1   Digestive Aids Mixture (DIGESTION GB PO) Take 1 capsule by mouth daily.     escitalopram  (LEXAPRO ) 20 MG tablet TAKE 1 TABLET(20 MG) BY MOUTH DAILY 90 tablet 1   ezetimibe -simvastatin  (VYTORIN ) 10-20 MG tablet Take 1 tablet by mouth daily. 90 tablet 3   hydrALAZINE  (APRESOLINE ) 25 MG tablet Take 1 tablet (25 mg total) by mouth 3 (three) times daily. In the morning and at dinner, may take extra tablet for BP greater than 160 270 tablet 3   Hyoscyamine Sulfate SL 0.125 MG SUBL TAKE 1 TABLET UNDER TONGUE TWICE DAILY OR AS DIRECTED     ipratropium (ATROVENT ) 0.03 % nasal spray Place 2 sprays into both  nostrils every 12 (twelve) hours. 30 mL 12   ketoconazole (NIZORAL) 2 % shampoo Apply 1 Application topically once.     Loperamide  HCl (IMODIUM  PO) Take by mouth as needed.      MAGNESIUM  BISGLYCINATE PO Take by mouth daily.     melatonin 5 MG TABS Take 5 mg by mouth at bedtime.     MIEBO 1.338 GM/ML SOLN Place 1 drop into both eyes 4 (four) times daily.     NONFORMULARY OR COMPOUNDED ITEM Bi-Mix Papaverine 30mg , Phentolamine 1mg    Dosage: Inject 0.25cc-0.5cc as need for ED   Vial 1ml   Qty #5 Refills 6   [facility] [phone] Fax [phone] 5 each 6   Nutritional Supplements (NUTRITIONAL SUPPLEMENT PO) Take 1 capsule by mouth daily. [name]     POTASSIUM PO Take 1 tablet by mouth daily at 6 (six) AM. Takes 5x weekly.     PREVIDENT 5000 DRY MOUTH 1.1 % GEL dental gel SMARTSIG:To Teeth 3 Times Daily     Probiotic Product (PROBIOTIC DAILY PO) Take by mouth daily.     prochlorperazine  (COMPAZINE ) 10 MG tablet TAKE 1 TABLET(10 MG) BY MOUTH TWICE DAILY AS NEEDED FOR NAUSEA OR VOMITING 180 tablet 0   pyridostigmine  (MESTINON ) 60 MG tablet TAKE 1 TABLET(60 MG) BY MOUTH TWICE DAILY 180 tablet 0   RABEprazole  (ACIPHEX ) 20 MG tablet Take 2 tablets (40 mg total) by mouth 2 (two) times daily. 360 tablet 3   simvastatin  (ZOCOR ) 20 MG tablet Take 20 mg by mouth.     sucralfate  (CARAFATE ) 1 g tablet Take 1 tablet (1 g total) by mouth 4 (four) times daily. 360 tablet 3   testosterone  cypionate (DEPOTESTOSTERONE CYPIONATE) 200 MG/ML injection Inject 80 mg into the muscle every 14 (fourteen) days.     triamcinolone cream (KENALOG) 0.1 % as needed (skin irritation, insect bites).     valACYclovir  (VALTREX ) 1000 MG tablet Take 2 tabs p.o. and repeat in 12 hours as needed for cold sore 30 tablet 0   busPIRone  (BUSPAR ) 15 MG  tablet Take 1 tablet (15 mg total) by mouth 3 (three) times daily. (Patient not taking: Reported on 04/28/2024) 270 tablet 0   No current  facility-administered medications for this visit.    Allergies  Allergen Reactions   Ace Inhibitors     Other reaction(s): Cough   Fluoxetine Anxiety    made me fall asleep per pt bad headaches and makes  Me  Crazy historical allergy noted in [employer] made me fall asleep per pt bad headaches and makes  Me  Crazy Per New Patient Packet.     Metoclopramide     Other reaction(s): Other (See Comments), Other (See Comments), Unknown Tardive Dyskinesia  historical allergy noted in [employer] Tardive Dyskinesia  Per New Patient Packet.   Nalbuphine     Used Post Back surgery- Anesthesiologist Error. Patient had Narcotic Withdraw. Per New Patient Packet.    Other     Other reaction(s): Other (See Comments) Altered mental status in combo with narcotics at previous hospitalization - Full Withdrawal Symptoms Other reaction(s): Rash   Amoxicillin-Pot Clavulanate Nausea Only    Per New Patient Packet.   Doxazosin Rash    Other reaction(s): Other - See Comments, Rash UNKNOWN REACTION UNKNOWN REACTION    Duloxetine Nausea Only    Per New Patient Packet.    Penicillins Nausea Only    Per New Patient Packet.   Tamsulosin Itching and Anxiety    Restless, Flushing, Heavy Chest, Itching, Hyperactive mood and Anxiety. Unable to handle side effects. Per New Patient Packet.     Trazodone And Nefazodone Itching, Anxiety and Rash    Headache. INCREASED MY ANXIETY AND HEARTRATE Flushing, tachycardia INCREASED MY ANXIETY AND HEARTRATE Per New Patient Packet.    Alirocumab  Other (See Comments)   Amlodipine     Shaking, unsure of reaction. Per New Patient Packet.     Cinoxacin     GI Intolerance, and Dizziness. Per New Patient Packet.    Ciprofloxacin     Other reaction(s): Unknown   Fludrocortisone  Other (See Comments)    Worsening headaches, GI issues, fatigue   Nebivolol     Other reaction(s): Unknown   Olanzapine     Headache and unable to sleep for 3 nights. Per New  Patient Packet.    Olmesartan     Other reaction(s): Unknown   Pregabalin     Confusion, Lack of concentration, dizziness, and likely drowsiness. Per New Patient Packet.     Prostaglandins     Other reaction(s): Other (See Comments) Intolerance   Thyroid  Hormones     Other reaction(s): Other (See Comments) Thyroid  (Nature Thyroid ) contraindicated with some of your other medications.   Venlafaxine  Other (See Comments)    High blood pressure- hospitalized   venlafaxine    Venlafaxine  Hcl Er Hypertension   Zolpidem     Nightmares, Ineffective after 2 days. Per New Patient Packet.    Duloxetine Hcl     Other reaction(s): Rash   Fluoxetine Hcl     Other reaction(s): Rash   Nucynta  [Tapentadol ] Other (See Comments)    Vertigo    Phenytoin Anxiety    Hyperactivity, and Ineffective. Per New Patient Packet.     Family History  Problem Relation Age of Onset   Heart disease Father    Heart failure Father    Hypertension Father    Stroke Father    Vision loss Father    Stroke Mother    Dementia Mother    Diabetes Mother    Kidney  Stones Daughter    Anxiety disorder Daughter    OCD Daughter     Social History   Socioeconomic History   Marital status: Married    Spouse name: Therapist, art   Number of children: Not on file   Years of education: Not on file   Highest education level: Master's degree (e.g., MA, MS, MEng, MEd, MSW, MBA)  Occupational History   Not on file  Tobacco Use   Smoking status: Never    Passive exposure: Never   Smokeless tobacco: Never  Vaping Use   Vaping status: Never Used  Substance and Sexual Activity   Alcohol use: Yes    Comment: 1 Drink a Month, socially   Drug use: Not Currently   Sexual activity: Yes    Birth control/protection: None  Other Topics Concern   Not on file  Social History Narrative   Tobacco use, amount per day now: None   Past tobacco use, amount per day: None   How many years did you use tobacco: 0   Alcohol use (drinks  per week): 0-1 Month   Diet:   Do you drink/eat things with caffeine : Occasionally ( Hot Chocolate and maybe 1/4 of 16oz Pepsi 2-3 times a week.   Marital status: Married                                  What year were you married? 1970   Do you live in a house, apartment, assisted living, condo, trailer, etc.? House   Is it one or more stories? One   How many persons live in your home? 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 Yes                                    Type and how often? [place], Recumbent Bike, 3 times a week. Try to get a 1.2 mile walk at least 3 times a week or more.     Do you have a living will? Yes   Do you have a DNR form?  No                                 If not, do you want to discuss one?   Do you have signed POA/HPOA forms? Yes                       If so, please bring to you appointment    Do you have any difficulty bathing or dressing yourself? No    Do you have difficulty preparing food or eating? No   Do you have difficulty managing your medications? No   Do you have any difficulty managing your finances? No   Do you have any difficulty affording your medications? No         Social Drivers of Corporate investment banker Strain: Low Risk  (04/24/2024)   Overall Financial Resource Strain (CARDIA)    Difficulty of Paying Living Expenses: Not very hard  Food Insecurity: No Food Insecurity (04/24/2024)   Hunger Vital Sign  Worried About Programme researcher, broadcasting/film/video in the Last Year: Never true    Ran Out of Food in the Last Year: Never true  Transportation Needs: No Transportation Needs (04/24/2024)   PRAPARE - Administrator, Civil Service (Medical): No    Lack of Transportation (Non-Medical): No  Physical Activity: Insufficiently Active (12/24/2023)   Exercise Vital Sign    Days of  Exercise per Week: 3 days    Minutes of Exercise per Session: 30 min  Stress: No Stress Concern Present (12/24/2023)   [employer] of Occupational Health - Occupational Stress Questionnaire    Feeling of Stress : Not at all  Social Connections: Moderately Isolated (04/24/2024)   Social Connection and Isolation Panel    Frequency of Communication with Friends and Family: Once a week    Frequency of Social Gatherings with Friends and Family: Once a week    Attends Religious Services: 1 to 4 times per year    Active Member of [employer] or Organizations: No    Attends Banker Meetings: Not on file    Marital Status: Married  Catering manager Violence: Not At Risk (12/24/2023)   Humiliation, Afraid, Rape, and Kick questionnaire    Fear of Current or Ex-Partner: No    Emotionally Abused: No    Physically Abused: No    Sexually Abused: No     Constitutional: Patient reports chronic fatigue, frequent headaches.  Denies fever, malaise, or abrupt weight changes.  HEENT: Denies eye pain, eye redness, ear pain, ringing in the ears, wax buildup, runny nose, nasal congestion, bloody nose, or sore throat. Respiratory: Pt reports intermittent shortness of breath. Denies difficulty breathing, cough or sputum production.   Cardiovascular: Denies chest pain, chest tightness, palpitations or swelling in the hands or feet.  Gastrointestinal: Patient reports intermittent abdominal cramping, constipation and diarrhea.  Denies bloating, or blood in the stool.  GU: Patient reports urinary frequency.  Denies urgency, pain with urination, burning sensation, blood in urine, odor or discharge. Musculoskeletal: Patient reports chronic joint pain, difficulty with gait.  Denies decrease in range of motion, muscle pain or joint swelling.  Skin: Denies redness, rashes, lesions or ulcercations.  Neurological: Patient reports neuropathic pain and tremor.  Denies difficulty with memory, difficulty with speech  or problems with balance and coordination.  Psych: Patient has a history of anxiety and depression.  Denies SI/HI.  No other specific complaints in a complete review of systems (except as listed in HPI above).  Objective:   Physical Exam  BP (!) 142/80 (BP Location: Left Arm, Patient Position: Sitting, Cuff Size: Normal)   Pulse 63   Ht 5' 9 (1.753 m)   Wt 139 lb 3.2 oz (63.1 kg)   SpO2 99%   BMI 20.56 kg/m    Wt Readings from Last 3 Encounters:  04/28/24 139 lb 3.2 oz (63.1 kg)  04/06/24 140 lb (63.5 kg)  02/17/24 144 lb 3.2 oz (65.4 kg)    General: Appears his stated age, well developed, well nourished in NAD. Skin: No rash noted. HEENT: Head: normal shape and size; Eyes: sclera white, no icterus, conjunctiva pink, PERRLA and EOMs intact;  Cardiovascular: Normal rate and rhythm.  Pulmonary/Chest: Normal effort and positive vesicular breath sounds. No respiratory distress.  Abdomen: He reports his abdomen is generally tender. Normal bowel sounds.  Musculoskeletal: Gait slow and steady without device. Neurological: Alert and oriented. Coordination normal but resting tremor noted in bilateral hands.  Psychiatric: Mood and affect normal.  Behavior is normal. Judgment and thought content normal.    BMET    Component Value Date/Time   NA 145 (H) 07/06/2023 1154   K 4.1 07/06/2023 1154   CL 105 07/06/2023 1154   CO2 25 07/06/2023 1154   GLUCOSE 75 07/06/2023 1154   GLUCOSE 92 04/20/2023 1502   BUN 11 07/06/2023 1154   CREATININE 0.84 07/06/2023 1154   CREATININE 0.76 09/18/2022 1025   CALCIUM  9.4 07/06/2023 1154   GFRNONAA >60 04/20/2023 1502   GFRAA 101 05/20/2020 0000    Lipid Panel     Component Value Date/Time   CHOL 108 10/15/2023 0859   TRIG 68 10/15/2023 0859   HDL 43 10/15/2023 0859   CHOLHDL 2.5 10/15/2023 0859   CHOLHDL 3.3 03/26/2023 0629   VLDL 14 03/26/2023 0629   LDLCALC 50 10/15/2023 0859   LDLCALC 103 (H) 11/11/2021 1006    CBC    Component  Value Date/Time   WBC 6.2 07/06/2023 1154   WBC 6.8 04/20/2023 1502   RBC 4.95 07/06/2023 1154   RBC 5.10 04/20/2023 1502   HGB 14.8 07/06/2023 1154   HCT 46.5 07/06/2023 1154   PLT 237 07/06/2023 1154   MCV 94 07/06/2023 1154   MCH 29.9 07/06/2023 1154   MCH 30.0 04/20/2023 1502   MCHC 31.8 07/06/2023 1154   MCHC 32.4 04/20/2023 1502   RDW 12.1 07/06/2023 1154   LYMPHSABS 1.6 07/06/2023 1154   MONOABS 1.2 (H) 04/19/2022 0952   EOSABS 0.0 07/06/2023 1154   BASOSABS 0.0 07/06/2023 1154    Hgb A1C Lab Results  Component Value Date   HGBA1C 5.0 07/06/2023           Assessment & Plan:     RTC in 6 months for follow-up of chronic conditions Angeline Laura, NP

## 2024-04-29 LAB — CBC
HCT: 46.8 % (ref 38.5–50.0)
Hemoglobin: 14.8 g/dL (ref 13.2–17.1)
MCH: 29.5 pg (ref 27.0–33.0)
MCHC: 31.6 g/dL — ABNORMAL LOW (ref 32.0–36.0)
MCV: 93.2 fL (ref 80.0–100.0)
MPV: 9.9 fL (ref 7.5–12.5)
Platelets: 255 Thousand/uL (ref 140–400)
RBC: 5.02 Million/uL (ref 4.20–5.80)
RDW: 12.4 % (ref 11.0–15.0)
WBC: 6.4 Thousand/uL (ref 3.8–10.8)

## 2024-04-29 LAB — COMPREHENSIVE METABOLIC PANEL WITH GFR
AG Ratio: 2.1 (calc) (ref 1.0–2.5)
ALT: 12 U/L (ref 9–46)
AST: 13 U/L (ref 10–35)
Albumin: 4.2 g/dL (ref 3.6–5.1)
Alkaline phosphatase (APISO): 85 U/L (ref 35–144)
BUN: 12 mg/dL (ref 7–25)
CO2: 29 mmol/L (ref 20–32)
Calcium: 9.8 mg/dL (ref 8.6–10.3)
Chloride: 105 mmol/L (ref 98–110)
Creat: 0.77 mg/dL (ref 0.70–1.28)
Globulin: 2 g/dL (ref 1.9–3.7)
Glucose, Bld: 94 mg/dL (ref 65–99)
Potassium: 4.8 mmol/L (ref 3.5–5.3)
Sodium: 142 mmol/L (ref 135–146)
Total Bilirubin: 0.4 mg/dL (ref 0.2–1.2)
Total Protein: 6.2 g/dL (ref 6.1–8.1)
eGFR: 93 mL/min/1.73m2 (ref >=60)

## 2024-04-29 LAB — LIPID PANEL
Cholesterol: 110 mg/dL (ref <200)
HDL: 48 mg/dL (ref >=40)
LDL Cholesterol (Calc): 46 mg/dL
Non-HDL Cholesterol (Calc): 62 mg/dL (ref <130)
Total CHOL/HDL Ratio: 2.3 (calc) (ref <5.0)
Triglycerides: 76 mg/dL (ref <150)

## 2024-04-29 LAB — HEMOGLOBIN A1C
Hgb A1c MFr Bld: 5.1 % (ref <5.7)
Mean Plasma Glucose: 100 mg/dL
eAG (mmol/L): 5.5 mmol/L

## 2024-04-30 ENCOUNTER — Encounter: Payer: Self-pay | Admitting: Internal Medicine

## 2024-04-30 DIAGNOSIS — G4733 Obstructive sleep apnea (adult) (pediatric): Secondary | ICD-10-CM | POA: Insufficient documentation

## 2024-04-30 MED ORDER — AMITRIPTYLINE HCL 10 MG PO TABS: 20.0000 mg | ORAL_TABLET | Freq: Every day | ORAL | Status: DC

## 2024-04-30 NOTE — Assessment & Plan Note (Signed)
 He does not want his pain stimulator removed He will continue tylenol  OTC for now I will research alternative pain management regimens

## 2024-04-30 NOTE — Assessment & Plan Note (Signed)
 Continue questran  4 mg daily prn, miralax , and hyscyamine 0.125 mg BID as needed Will discontinue bentyl  10 mg

## 2024-04-30 NOTE — Assessment & Plan Note (Signed)
 He will continue rabeprazole  40 mg bid  He will wean carafate  to 1 tab for a few weeks, then try to stop this medication to see if we can discontinue it Try to avoid famotidine and gaviscon for now if you can Try to identify and avoid triggers

## 2024-04-30 NOTE — Assessment & Plan Note (Signed)
 He will continue to follow-up with integrative medicine

## 2024-04-30 NOTE — Assessment & Plan Note (Signed)
 He will continue tylenol  OTC for now  I will look into pain medication that would improve his pain management

## 2024-04-30 NOTE — Assessment & Plan Note (Signed)
 He will continue propranolol  40 mg BID for now He no longer wants to follow with neurology

## 2024-04-30 NOTE — Assessment & Plan Note (Signed)
 Continue valacyclovir   2000 mg daily as needed

## 2024-04-30 NOTE — Assessment & Plan Note (Signed)
 He will plan to wean mestinon  60 mg (1/2 tab bid x 2 weeks, then 1/2 tab at bedtime x 2 weeks Will monitor for increased lightheadedness and dizziness

## 2024-04-30 NOTE — Patient Instructions (Signed)

## 2024-04-30 NOTE — Assessment & Plan Note (Signed)
 Continue melatonin OTC and amitriptyline  20 mg at bedtime Plan to try to wean amitriptyline  further to 10 mg  Will consider alternative medication regimen for insomnia OSA is a component but he is adament that he does not want to wear a CPAP

## 2024-04-30 NOTE — Assessment & Plan Note (Signed)
 Currently not medicated

## 2024-04-30 NOTE — Assessment & Plan Note (Signed)
 Reasonable control on hydralazine  (50 mg Qam, Qnoon and 25 mg at bedtime), propranolol  40 mg BID. He will start taking mestinon  60 mg (1/2 tab bid x 2 weeks then 1/2 tab at bedtime x 2 weeks) Reinforced DASH diet CMET today

## 2024-04-30 NOTE — Assessment & Plan Note (Signed)
 He will continue to wean amitriptyline  down to 10 mg, and eventually wean off this Continue propranolol  40 mg BID Will discontinue fioricet  due to nonuse He no longer wants to follow with neurology

## 2024-04-30 NOTE — Assessment & Plan Note (Signed)
 Does not want to wear a CPAP machine

## 2024-04-30 NOTE — Assessment & Plan Note (Signed)
 He is awaiting results of EMG testing He will continue tylenol  for now while I am looking up better options for his pain management He no longer follows with pain management

## 2024-04-30 NOTE — Assessment & Plan Note (Signed)
 No angina Lipid profile today Continue ezetimibe  10 mg daily, simvastatin  40 mg daily, propranolol  40 mg BID, aspirin  81 mg Encourage low-fat diet

## 2024-04-30 NOTE — Assessment & Plan Note (Signed)
 Continue escitalopram  20 mg daily Will discontinue buspirone  due to nonuse and side effects Support offered

## 2024-04-30 NOTE — Assessment & Plan Note (Signed)
 CMET and lipid profile today Encouraged him to consume low-fat diet Continue ezetimibe  10 mg and simvastatin  40 mg daily

## 2024-04-30 NOTE — Assessment & Plan Note (Signed)
 I would like to take to his integrative medicine doctor to see if we could get him off the testosterone  and arimidex , or at least the arimidex  Will plan to reach out to Dr. Tilford on Monday

## 2024-05-01 ENCOUNTER — Ambulatory Visit: Payer: Self-pay | Admitting: Internal Medicine

## 2024-05-02 ENCOUNTER — Encounter: Payer: Self-pay | Admitting: Internal Medicine

## 2024-05-04 DIAGNOSIS — G5622 Lesion of ulnar nerve, left upper limb: Secondary | ICD-10-CM | POA: Insufficient documentation

## 2024-05-16 DIAGNOSIS — G5621 Lesion of ulnar nerve, right upper limb: Secondary | ICD-10-CM | POA: Insufficient documentation

## 2024-05-17 ENCOUNTER — Encounter: Payer: Self-pay | Admitting: Internal Medicine

## 2024-05-19 ENCOUNTER — Telehealth: Payer: Self-pay | Admitting: Cardiology

## 2024-05-19 ENCOUNTER — Telehealth: Payer: Self-pay | Admitting: *Deleted

## 2024-05-19 NOTE — Telephone Encounter (Signed)
 Pt has been scheduled tele preop appt 05/31/24. Med rec and consent are done.

## 2024-05-19 NOTE — Telephone Encounter (Signed)
 Pt has been scheduled tele preop appt 05/31/24. Med rec and consent are done.     Patient Consent for Virtual Visit        Grant Young has provided verbal consent on 05/19/2024 for a virtual visit (video or telephone).   CONSENT FOR VIRTUAL VISIT FOR:  Grant Young  By participating in this virtual visit I agree to the following:  I hereby voluntarily request, consent and authorize [facility] and its employed or contracted physicians, physician assistants, nurse practitioners or other licensed health care professionals (the Practitioner), to provide me with telemedicine health care services (the "Services) as deemed necessary by the treating Practitioner. I acknowledge and consent to receive the Services by the Practitioner via telemedicine. I understand that the telemedicine visit will involve communicating with the Practitioner through live audiovisual communication technology and the disclosure of certain medical information by electronic transmission. I acknowledge that I have been given the opportunity to request an in-person assessment or other available alternative prior to the telemedicine visit and am voluntarily participating in the telemedicine visit.  I understand that I have the right to withhold or withdraw my consent to the use of telemedicine in the course of my care at any time, without affecting my right to future care or treatment, and that the Practitioner or I may terminate the telemedicine visit at any time. I understand that I have the right to inspect all information obtained and/or recorded in the course of the telemedicine visit and may receive copies of available information for a reasonable fee.  I understand that some of the potential risks of receiving the Services via telemedicine include:  Delay or interruption in medical evaluation due to technological equipment failure or disruption; Information transmitted may not be sufficient (e.g. poor resolution  of images) to allow for appropriate medical decision making by the Practitioner; and/or  In rare instances, security protocols could fail, causing a breach of personal health information.  Furthermore, I acknowledge that it is my responsibility to provide information about my medical history, conditions and care that is complete and accurate to the best of my ability. I acknowledge that Practitioner's advice, recommendations, and/or decision may be based on factors not within their control, such as incomplete or inaccurate data provided by me or distortions of diagnostic images or specimens that may result from electronic transmissions. I understand that the practice of medicine is not an exact science and that Practitioner makes no warranties or guarantees regarding treatment outcomes. I acknowledge that a copy of this consent can be made available to me via my patient portal [facility] MyChart), or I can request a printed copy by calling the office of [facility].    I understand that my insurance will be billed for this visit.   I have read or had this consent read to me. I understand the contents of this consent, which adequately explains the benefits and risks of the Services being provided via telemedicine.  I have been provided ample opportunity to ask questions regarding this consent and the Services and have had my questions answered to my satisfaction. I give my informed consent for the services to be provided through the use of telemedicine in my medical care

## 2024-05-19 NOTE — Telephone Encounter (Signed)
   Name: Grant Young  DOB: 04-Mar-1946  MRN: [id number]  Primary Cardiologist: Alm Clay, MD   Preoperative team, please contact this patient and set up a phone call appointment for further preoperative risk assessment. Please obtain consent and complete medication review. Thank you for your help.  I confirm that guidance regarding antiplatelet and oral anticoagulation therapy has been completed and, if necessary, noted below.  I also confirmed the patient resides in the state of  . As per [facility] Medical Board telemedicine laws, the patient must reside in the state in which the provider is licensed.   Lamarr Satterfield, NP 05/19/2024, 11:17 AM [facility]

## 2024-05-19 NOTE — Telephone Encounter (Signed)
   Pre-operative Risk Assessment    Patient Name: Grant Young  DOB: July 17, 1946 MRN: [id number]   Date of last office visit: 12/30/23 Date of next office visit: 06/20/24   Request for Surgical Clearance    Procedure:  right cubital tunnel release   Date of Surgery:  Clearance 06/09/24                                Surgeon:  Dr Francisco Socks Group or Practice Name:  [facility] Phone number:  [phone] Fax number:  [phone]   Type of Clearance Requested:   - Medical    Type of Anesthesia:  Not Indicated   Additional requests/questions:    Bonney Rosina Stamps   05/19/2024, 11:13 AM

## 2024-05-23 ENCOUNTER — Ambulatory Visit: Payer: Self-pay

## 2024-05-23 NOTE — Telephone Encounter (Signed)
 Patient states he has been waiting for a response for the following. States MyChart response is preferred, but telephone is ok as well.   -Medication for sleep aid  -Pt has MOST form and DNR in his possession, is asking what else he needs to do with those  -Update on next steps for the Medication Vacation at [facility]  Protocols used: Medication Question Call-A-AH  Reason for Disposition  [1] Caller has medicine question about med NOT prescribed by primary care doctor (or NP/PA) or specialist AND [2] triager unable to answer question (e.g., compatibility with other med, storage)  Answer Assessment - Initial Assessment Questions Patient asking for follow up regarding, would prefer response via MyChart portal:  -Medication for sleep aid  -Pt has MOST form and DNR in his possession, is asking what else he needs to do with those  -Update on next steps for the Medication Vacation at [facility]  Protocols used: Medication Question Call-A-AH

## 2024-05-23 NOTE — Telephone Encounter (Signed)
 Copied from CRM #[id number]. Topic: Clinical - Medication Question >> May 23, 2024  3:58 PM Precious C wrote: Reason for CRM: Patient called to follow up regarding a previous message he sent, as he has not received a response. He is requesting medication for sleep, reporting that he has not been able to sleep and wants to know what to do moving forward. Patient is requesting a callback via telephone to discuss if something can be prescribed. Callback number: [phone].  Phone call placed to patient-no answer-voicemail left for patient to call back to Nurse triage. Will place in callbacks.

## 2024-05-24 ENCOUNTER — Ambulatory Visit (INDEPENDENT_AMBULATORY_CARE_PROVIDER_SITE_OTHER): Admitting: Urology

## 2024-05-24 VITALS — BP 152/82 | HR 62 | Wt 140.0 lb

## 2024-05-24 DIAGNOSIS — N3281 Overactive bladder: Secondary | ICD-10-CM

## 2024-05-24 DIAGNOSIS — N529 Male erectile dysfunction, unspecified: Secondary | ICD-10-CM | POA: Diagnosis not present

## 2024-05-24 DIAGNOSIS — N400 Enlarged prostate without lower urinary tract symptoms: Secondary | ICD-10-CM

## 2024-05-24 LAB — BLADDER SCAN AMB NON-IMAGING

## 2024-05-24 MED ORDER — GEMTESA 75 MG PO TABS: 75.0000 mg | ORAL_TABLET | Freq: Every day | ORAL | 11 refills | Status: AC

## 2024-05-24 NOTE — Progress Notes (Signed)
   05/24/2024 10:46 AM   Grant Young 1945-10-03 [id number]  Reason for visit: Follow up ED, LUTS, PSA screening, hypogonadism  History: Low testosterone  on testosterone  cypionate through outside functional medicine MD in [place] , those records are not available to us  PSA normal, most recently 0.4 from September 2024 Using bimix for ED (alprostadil allergy) History of overactive symptoms on Gemtesa , symptoms initially improved after stopping his diuretic and no longer on medications February 2022 cystoscopy/TRUS for urinary frequency showing a wide open prostatic fossa, prostate volume 15 g(history of greenlight PVP previously)  Physical Exam: BP (!) 152/82 (BP Location: Left Arm, Patient Position: Sitting, Cuff Size: Normal)   Pulse 62   Wt 140 lb (63.5 kg)   SpO2 97%   BMI 20.67 kg/m   Imaging/labs: PSA September 2024 normal at 0.4  Today: PVR normal at 44ml Continues on outside testosterone  replacement Bimix continues to work for ED Reports worsening urinary frequency every hour during the day, he is interested in going back on the Gemtesa   Plan:   Trial of Gemtesa  Can refill Bimix at local pharmacy when ready RTC 1 year PVR   Grant JAYSON Burnet, MD  [facility] [address] [place], [place] [address] [phone]

## 2024-05-24 NOTE — Telephone Encounter (Signed)
 Will address in a mychart message

## 2024-05-25 ENCOUNTER — Ambulatory Visit: Payer: Self-pay | Admitting: Urology

## 2024-05-25 MED ORDER — BELSOMRA 10 MG PO TABS: 10.0000 mg | ORAL_TABLET | Freq: Every evening | ORAL | 0 refills | Status: DC | PRN

## 2024-05-25 NOTE — Addendum Note (Signed)
 Addended by: ANTONETTE ANGELINE ORN on: 05/25/2024 07:52 AM   Modules accepted: Orders

## 2024-05-28 ENCOUNTER — Encounter: Payer: Self-pay | Admitting: Internal Medicine

## 2024-05-29 MED ORDER — HYDRALAZINE HCL 25 MG PO TABS: ORAL_TABLET | ORAL | 1 refills | Status: AC

## 2024-05-30 ENCOUNTER — Emergency Department
Admission: EM | Admit: 2024-05-30 | Discharge: 2024-05-31 | Disposition: A | Discharge status: Home / Self Care | Attending: Emergency Medicine | Admitting: Emergency Medicine

## 2024-05-30 ENCOUNTER — Emergency Department

## 2024-05-30 ENCOUNTER — Other Ambulatory Visit: Payer: Self-pay

## 2024-05-30 DIAGNOSIS — R519 Headache, unspecified: Secondary | ICD-10-CM | POA: Diagnosis present

## 2024-05-30 DIAGNOSIS — R42 Dizziness and giddiness: Secondary | ICD-10-CM | POA: Diagnosis not present

## 2024-05-30 DIAGNOSIS — I2510 Atherosclerotic heart disease of native coronary artery without angina pectoris: Secondary | ICD-10-CM | POA: Insufficient documentation

## 2024-05-30 DIAGNOSIS — R1013 Epigastric pain: Secondary | ICD-10-CM | POA: Diagnosis not present

## 2024-05-30 LAB — CBC
HCT: 47.8 % (ref 39.0–52.0)
Hemoglobin: 15.6 g/dL (ref 13.0–17.0)
MCH: 29.5 pg (ref 26.0–34.0)
MCHC: 32.6 g/dL (ref 30.0–36.0)
MCV: 90.5 fL (ref 80.0–100.0)
Platelets: 198 K/uL (ref 150–400)
RBC: 5.28 MIL/uL (ref 4.22–5.81)
RDW: 13.5 % (ref 11.5–15.5)
WBC: 6.8 K/uL (ref 4.0–10.5)
nRBC: 0 % (ref 0.0–0.2)

## 2024-05-30 LAB — URINALYSIS, ROUTINE W REFLEX MICROSCOPIC
Bilirubin Urine: NEGATIVE
Glucose, UA: NEGATIVE mg/dL
Hgb urine dipstick: NEGATIVE
Ketones, ur: NEGATIVE mg/dL
Leukocytes,Ua: NEGATIVE
Nitrite: NEGATIVE
Protein, ur: NEGATIVE mg/dL
Specific Gravity, Urine: 1.01 (ref 1.005–1.030)
pH: 7 (ref 5.0–8.0)

## 2024-05-30 LAB — COMPREHENSIVE METABOLIC PANEL WITH GFR
ALT: 14 U/L (ref 0–44)
AST: 17 U/L (ref 15–41)
Albumin: 3.8 g/dL (ref 3.5–5.0)
Alkaline Phosphatase: 81 U/L (ref 38–126)
Anion gap: 10 (ref 5–15)
BUN: 14 mg/dL (ref 8–23)
CO2: 26 mmol/L (ref 22–32)
Calcium: 9.3 mg/dL (ref 8.9–10.3)
Chloride: 107 mmol/L (ref 98–111)
Creatinine, Ser: 0.89 mg/dL (ref 0.61–1.24)
GFR, Estimated: >60 mL/min (ref >60)
Glucose, Bld: 100 mg/dL — ABNORMAL HIGH (ref 70–99)
Potassium: 4.2 mmol/L (ref 3.5–5.1)
Sodium: 143 mmol/L (ref 135–145)
Total Bilirubin: 0.7 mg/dL (ref 0.0–1.2)
Total Protein: 6.4 g/dL — ABNORMAL LOW (ref 6.5–8.1)

## 2024-05-30 LAB — LIPASE, BLOOD: Lipase: 36 U/L (ref 11–51)

## 2024-05-30 LAB — MAGNESIUM: Magnesium: 2.3 mg/dL (ref 1.7–2.4)

## 2024-05-30 LAB — TROPONIN I (HIGH SENSITIVITY): Troponin I (High Sensitivity): 12 ng/L (ref <18)

## 2024-05-30 MED ORDER — ALUM & MAG HYDROXIDE-SIMETH 200-200-20 MG/5ML PO SUSP
30.0000 mL | Freq: Once | ORAL | Status: AC
  Administered 2024-05-30: 30 mL via ORAL
  Filled 2024-05-30: qty 30

## 2024-05-30 MED ORDER — MECLIZINE HCL 25 MG PO TABS
25.0000 mg | ORAL_TABLET | Freq: Once | ORAL | Status: AC
  Administered 2024-05-30: 25 mg via ORAL
  Filled 2024-05-30: qty 1

## 2024-05-30 MED ORDER — LIDOCAINE VISCOUS HCL 2 % MT SOLN
15.0000 mL | Freq: Once | OROMUCOSAL | Status: AC
  Administered 2024-05-30: 15 mL via OROMUCOSAL
  Filled 2024-05-30: qty 15

## 2024-05-30 MED ORDER — MECLIZINE HCL 25 MG PO TABS: 25.0000 mg | ORAL_TABLET | Freq: Three times a day (TID) | ORAL | 0 refills | Status: DC | PRN

## 2024-05-30 MED ORDER — HYDROCODONE-ACETAMINOPHEN 5-325 MG PO TABS: 1.0000 | ORAL_TABLET | Freq: Four times a day (QID) | ORAL | 0 refills | Status: AC | PRN

## 2024-05-30 MED ORDER — HYDROCODONE-ACETAMINOPHEN 5-325 MG PO TABS
1.0000 | ORAL_TABLET | Freq: Once | ORAL | Status: AC
  Administered 2024-05-30: 1 via ORAL
  Filled 2024-05-30: qty 1

## 2024-05-30 MED ORDER — IOHEXOL 300 MG/ML  SOLN
100.0000 mL | Freq: Once | INTRAMUSCULAR | Status: AC | PRN
Start: 2024-05-30 — End: 2024-05-30
  Administered 2024-05-30: 100 mL via INTRAVENOUS

## 2024-05-30 NOTE — ED Provider Notes (Signed)
 [facility] Provider Note    Event Date/Time   First MD Initiated Contact with Patient 05/30/24 2121     (approximate)   History   Abdominal Pain   HPI  Grant Young is a 78 year old male with history of CAD, IBS presenting to the emergency department for evaluation of multiple complaints.  Patient reports he has had ongoing abdominal pain for several months.  He is awaiting follow-up with GI in December.  Reports he has had worsening generalized abdominal pain over the last week.  In addition, he reports that he has had a headache, not sudden in onset or different in character than prior, but has had associated dizziness described as a spinning sensation.  Reports a history of vertigo but is unsure the last time he had this.  Does report that the spinning sensation is worse with positional changes.     Physical Exam   Triage Vital Signs: ED Triage Vitals  Encounter Vitals Group     BP 05/30/24 1737 (!) 196/89     Girls Systolic BP Percentile --      Girls Diastolic BP Percentile --      Boys Systolic BP Percentile --      Boys Diastolic BP Percentile --      Pulse Rate 05/30/24 1737 67     Resp 05/30/24 1737 16     Temp 05/30/24 1737 98.6 F (37 C)     Temp Source 05/30/24 1737 Oral     SpO2 05/30/24 1737 97 %     Weight 05/30/24 1738 139 lb 15.9 oz (63.5 kg)     Height --      Head Circumference --      Peak Flow --      Pain Score 05/30/24 1738 9     Pain Loc --      Pain Education --      Exclude from Growth Chart --     Most recent vital signs: Vitals:   05/30/24 1737  BP: (!) 196/89  Pulse: 67  Resp: 16  Temp: 98.6 F (37 C)  SpO2: 97%     General: Awake, interactive  CV:  Regular rate, good peripheral perfusion.  Resp:  Unlabored respirations.  Abd:  Nondistended, soft, mild generalized tenderness without rebound or guarding  Neuro:  Alert and oriented, normal extraocular movements, symmetric facial movement, sensation  intact over bilateral upper and lower extremities with 5 out of 5 strength.  Normal finger-to-nose testing.  ED Results / Procedures / Treatments   Labs (all labs ordered are listed, but only abnormal results are displayed) Labs Reviewed  COMPREHENSIVE METABOLIC PANEL WITH GFR - Abnormal; Notable for the following components:      Result Value   Glucose, Bld 100 (*)    Total Protein 6.4 (*)    All other components within normal limits  URINALYSIS, ROUTINE W REFLEX MICROSCOPIC - Abnormal; Notable for the following components:   Color, Urine YELLOW (*)    APPearance CLEAR (*)    All other components within normal limits  LIPASE, BLOOD  CBC  MAGNESIUM   TROPONIN I (HIGH SENSITIVITY)     EKG EKG independently reviewed and interpreted by myself demonstrates:  EKG demonstrates sinus rhythm at a rate of 58, PR 183, QRS 128, QTc 459, nonspecific ST changes, no STEMI  RADIOLOGY Imaging independently reviewed and interpreted by myself demonstrates:  CT head without acute bleed CT abdomen pelvis with questionable enteritis, no other acute  findings.  Clinical history left suggestive of acute enteritis.  Formal Radiology Read:  CT ABDOMEN PELVIS W CONTRAST Result Date: 05/30/2024 CLINICAL DATA:  Acute abdominal pain. EXAM: CT ABDOMEN AND PELVIS WITH CONTRAST TECHNIQUE: Multidetector CT imaging of the abdomen and pelvis was performed using the standard protocol following bolus administration of intravenous contrast. RADIATION DOSE REDUCTION: This exam was performed according to the departmental dose-optimization program which includes automated exposure control, adjustment of the mA and/or kV according to patient size and/or use of iterative reconstruction technique. CONTRAST:  OMNIPAQUE  IOHEXOL  300 MG/ML  SOLN COMPARISON:  Abdominopelvic CT 04/20/2023 FINDINGS: Lower chest: Nodular opacities in the lung bases have diminished in size or are stable from prior exam, previously assessed by  chest CT and favoring chronic indolent infection. Small fat containing Bochdalek hernia on the right. No enlarging pulmonary nodule Hepatobiliary: No suspicious liver lesion. Focal fatty infiltration adjacent to the falciform ligament. Stable biliary tree post cholecystectomy. Pancreas: No ductal dilatation or inflammation. Spleen: Normal in size without focal abnormality. Adrenals/Urinary Tract: No adrenal nodule. Nonobstructing bilateral renal calculi. No hydronephrosis. Left renal cyst. No further follow-up imaging is recommended. No evidence of renal inflammation. Moderate urinary bladder distension. No bladder wall thickening. Stomach/Bowel: Small hiatal hernia. Duodenal diverticulum. Few fluid-filled loops of small bowel in the pelvis without wall thickening or inflammation. No obstruction small to moderate colonic stool burden. Occasional colonic diverticula without diverticulitis. Vascular/Lymphatic: Aortic atherosclerosis without aneurysm. Patent portal vein. No suspicious lymphadenopathy. Reproductive: Prostatic calcifications. Other: No ascites. Left lateral spinal stimulator battery pack with abandoned leads on the right, unchanged. Musculoskeletal: Postsurgical change at the lumbosacral junction. No acute osseous findings. IMPRESSION: 1. Few fluid-filled loops of small bowel in the pelvis without wall thickening or inflammation, can be seen with enteritis. 2. Nonobstructing bilateral renal calculi. 3. Colonic diverticulosis without diverticulitis. 4. Nodular opacities in the lung bases have diminished in size or are stable from prior exam, previously assessed by chest CT and favoring chronic indolent infection. Aortic Atherosclerosis (ICD10-I70.0). Electronically Signed   By: Andrea Gasman M.D.   On: 05/30/2024 22:54   CT Head Wo Contrast Result Date: 05/30/2024 CLINICAL DATA:  Headache, new onset (Age >= 51y). Abd pain, headache, dizziness x 1 week. Last BM 05/30/2024. Decreased urine output.  EXAM: CT HEAD WITHOUT CONTRAST TECHNIQUE: Contiguous axial images were obtained from the base of the skull through the vertex without intravenous contrast. RADIATION DOSE REDUCTION: This exam was performed according to the departmental dose-optimization program which includes automated exposure control, adjustment of the mA and/or kV according to patient size and/or use of iterative reconstruction technique. COMPARISON:  CT head 01/13/2024 FINDINGS: Brain: Right frontal, parietal, occipital encephalomalacia. Left frontotemporal encephalomalacia. Patchy and confluent areas of decreased attenuation are noted throughout the deep and periventricular white matter of the cerebral hemispheres bilaterally, compatible with chronic microvascular ischemic disease. No evidence of large-territorial acute infarction. No parenchymal hemorrhage. No mass lesion. No extra-axial collection. No mass effect or midline shift. No hydrocephalus. Basilar cisterns are patent. Vascular: No hyperdense vessel. Atherosclerotic calcifications are present within the cavernous internal carotid and vertebral arteries. Skull: No acute fracture or focal lesion. Sinuses/Orbits: Left maxillary sinus mucosal thickening. Paranasal sinuses and mastoid air cells are clear. The orbits are unremarkable. Other: None. IMPRESSION: No acute intracranial abnormality. Electronically Signed   By: Morgane  Naveau M.D.   On: 05/30/2024 22:47    PROCEDURES:  Critical Care performed: No  Procedures   MEDICATIONS ORDERED IN ED: Medications  HYDROcodone -acetaminophen  (NORCO/VICODIN)  5-325 MG per tablet 1 tablet (has no administration in time range)  alum & mag hydroxide-simeth (MAALOX/MYLANTA) 200-200-20 MG/5ML suspension 30 mL (has no administration in time range)  lidocaine  (XYLOCAINE ) 2 % viscous mouth solution 15 mL (has no administration in time range)  meclizine  (ANTIVERT ) tablet 25 mg (25 mg Oral Given 05/30/24 2215)  iohexol  (OMNIPAQUE ) 300 MG/ML  solution 100 mL (100 mLs Intravenous Contrast Given 05/30/24 2229)     IMPRESSION / MDM / ASSESSMENT AND PLAN / ED COURSE  I reviewed the triage vital signs and the nursing notes.  Differential diagnosis includes, but is not limited to, colitis, diverticulitis, other acute intra-abdominal process, intracranial bleed, peripheral vertigo, much lower suspicion for central vertigo given reassuring neurologic exam, positional nature of symptoms  Patient's presentation is most consistent with acute presentation with potential threat to life or bodily function.  78 year old male presenting to the emergency department for evaluation of abdominal pain, headache, dizziness.  Reassuring labs including CBC, CMP, lipase.  Normal magnesium  and troponin.  Urinalysis without evidence of infection.  CT head and CT abdomen pelvis reassuring.  Patient's pain does seem to be worse in his epigastric area and reports that he had an endoscopy in the past demonstrating ulcers.  Suspect this may be contributing to his symptoms.  Reports that he is already taking H2 blocker, carafate , bentyl .  Will recommend PPI and DC with short course of Norco for breakthrough symptoms and PCP and outpatient GI follow-up.  Regarding headache and dizziness, CT head reassuring.  Was given meclizine  here with resolution of his dizziness.  Low suspicion central process, do think patient is stable for discharge for further outpatient evaluation of this.       FINAL CLINICAL IMPRESSION(S) / ED DIAGNOSES   Final diagnoses:  Epigastric pain  Acute nonintractable headache, unspecified headache type  Vertigo     Rx / DC Orders   ED Discharge Orders          Ordered    meclizine  (ANTIVERT ) 25 MG tablet  3 times daily PRN        05/30/24 2340    HYDROcodone -acetaminophen  (NORCO/VICODIN) 5-325 MG tablet  Every 6 hours PRN        05/30/24 2340             Note:  This document was prepared using Dragon voice recognition software  and may include unintentional dictation errors.   Levander Slate, MD 05/30/24 [phone]

## 2024-05-30 NOTE — Discharge Instructions (Signed)
 You are seen in the ER today for evaluation of your headache, dizziness, abdominal pain.  Your testing was fortunately overall reassuring.  I recommend taking a PPI medication such as omeprazole or pantoprazole  for your abdominal pain.  You can also continue to use your Carafate , Bentyl .  I sent a prescription for short course of narcotic pain medicine to your pharmacy for breakthrough symptoms.  Follow with your primary care doctor and GI for further evaluation.  For your dizziness I have also sent a prescription for a medicine called meclizine .  Return to the ER for new or worsening symptoms.

## 2024-05-30 NOTE — ED Triage Notes (Signed)
 C/O abd pain, headache, dizziness x 1 week.  Last BM 05/30/2024.  Decreased urine output. Describes urinary frequency and urgency.  AAOx3. Skin warm and dry. NAD

## 2024-05-31 ENCOUNTER — Ambulatory Visit: Attending: Cardiology

## 2024-05-31 DIAGNOSIS — Z01810 Encounter for preprocedural cardiovascular examination: Secondary | ICD-10-CM

## 2024-05-31 MED ORDER — TRAZODONE HCL 50 MG PO TABS: 25.0000 mg | ORAL_TABLET | Freq: Every day | ORAL | 0 refills | Status: DC

## 2024-05-31 NOTE — Addendum Note (Signed)
 Addended by: ANTONETTE ANGELINE ORN on: 05/31/2024 01:16 PM   Modules accepted: Orders

## 2024-05-31 NOTE — Progress Notes (Signed)
 Virtual Visit via Telephone Note   Because of Grant Young co-morbid illnesses, he is at least at moderate risk for complications without adequate follow up.  This format is felt to be most appropriate for this patient at this time.  Due to technical limitations with video connection (technology), today's appointment will be conducted as an audio only telehealth visit, and Grant Young verbally agreed to proceed in this manner.   All issues noted in this document were discussed and addressed.  No physical exam could be performed with this format.  Evaluation Performed:  Preoperative cardiovascular risk assessment _____________   Date:  05/31/2024   Patient ID:  Grant Young, DOB [birth year], MRN [id number] Patient Location:  Home Provider location:   Office  Primary Care Provider:  Antonette Angeline LELON, NP Primary Cardiologist:  Grant Clay, MD Chief Complaint / Patient Profile  78 y.o. y/o male with a h/o CAD with MI in 65 and 1999 with PTCA in [place] , patient presented with NSTEMI in 2023 s/p PCI with DES 2.5 x 26 mm to proximal LCx, DES 2.5 x 34 mm to proximal LAD, hypertension, hyperlipidemia, and migraines who is pending right cubital tunnel release and presents today for telephonic preoperative cardiovascular risk assessment. History of Present Illness  Grant Young is a 78 y.o. male who presents via audio/video conferencing for a telehealth visit today.  Pt was last seen in cardiology clinic on 12/30/2023 by Grant Hila, NP.  At that time Grant Young was doing well.  The patient is now pending procedure as outlined above. Since his last visit, he has remained stable from a cardiac standpoint.  Patient reports that he has had ongoing GI/abdominal pain for recent months and is being followed by gastroenterology, noted to have ulcers on endoscopy earlier in the year, he has been evaluated in the ED regarding GI pain with normal troponin levels.  Patient notes abdominal pain  is constant and not affected by exertion, he plans to follow-up with his gastroenterologist soon. Patient denies any chest pain, shortness of breath, lower extremity edema, fatigue, palpitations, presyncope, syncope, orthopnea or PND.  Patient denies any chest pain similar to his prior anginal equivalent.  Patient notes that his activity is somewhat limited by back problems however he does attend water aerobics classes and tolerates well.  He is able to achieve greater than 4 METS of activity without concerning anginal symptoms. 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   Kidney Stones   Clotting disorder (HCC) 01-2022   Plavix    Coronary artery disease    a.) s/p PCI with DES x 2 (pLCx and o-mLAD) 11/27/2021   Depression    Erectile dysfunction    a.) Young-mix (papaverine + phentolamine) injections + exogenous testosterone  injections   GERD (gastroesophageal reflux disease)    h/o Lyme disease    Headache    Hiatal hernia    History of bilateral cataract extraction 01/2021   History of gastritis    History of kidney stones    History of neuropathy    Hyperlipidemia LDL goal <70  a.) CAD with NSTEMI -->  intolerant of statins with statin myopathy and memory issues.;  Also intolerant of Repatha    Hypertension    Labile blood pressures but dizziness with blood pressures in the normal range; intolerant of most medications including ARB's, ACE inhibitor's, amlodipine most beta-blockers other than propranolol .   Insomnia    a.) takes malatonin PRN   Left lower lobe pulmonary nodule    a.) chest CT 12/07/2022: nodules x 2 posteromedial LLL;  8 x 11 mm and 8 x 10 mm   Long term current use of  antithrombotics/antiplatelets    a.) DAPT (ASA + clopidogrel )   Long term current use of aromatase inhibitor    a,) anastrozole  --> estridiol suppression secondary to exogenous testosterone  use   Lumbar radicular pain    Myocardial infarction (HCC)    a.) MI x 2 - 1990 * 1999 - PTCA   NSTEMI (non-ST elevated myocardial infarction) (HCC) 11/27/2021   a.) LHC 11/28/2021: sequential 75% o-pLAD, 70% OM2, 80% p-mLAD, 99% mLAD, 100% pLCx --> PCI placing a 2.5 x 26 mm Onyx Frontier DES to pLCx and a 2.5 x 24 mm Onyx Frontier DES to the o-m LAD (covering 3 lesions)   Overactive bladder    Pneumonia    PONV (postoperative nausea and vomiting)    Recurrent herpes labialis    a.) has suppressive valacyclovir  to use PRN   Skin cancer, basal cell    Spinal cord stimulator status    01/08/21 - not currently using.   Squamous cell skin cancer    Substance abuse (HCC)    Syncope and collapse 2022   Past Surgical History:  Procedure Laterality Date   BACK SURGERY     BASAL CELL CARCINOMA EXCISION     BIOPSY  07/27/2023   Procedure: BIOPSY;  Surgeon: Grant Bi, MD;  Location: [facility] ENDOSCOPY;  Service: Gastroenterology;;   CATARACT EXTRACTION W/PHACO Left 01/14/2021   Procedure: CATARACT EXTRACTION PHACO AND INTRAOCULAR LENS PLACEMENT (IOC) LEFT VIVITY TORIC LENS 8.75 00:56.7;  Surgeon: Grant Fallow, MD;  Location: [facility];  Service: Ophthalmology;  Laterality: Left;   CATARACT EXTRACTION W/PHACO Right 01/28/2021   Procedure: CATARACT EXTRACTION PHACO AND INTRAOCULAR LENS PLACEMENT (IOC) RIGHT VIVITY TORIC LENS;  Surgeon: Grant Fallow, MD;  Location: [facility];  Service: Ophthalmology;  Laterality: Right;  6.54 00:46.4   CHOLECYSTECTOMY  2017   COLONOSCOPY  09/15/2015   Per New Patient Packet   COLONOSCOPY N/A 01/26/2024   Procedure: COLONOSCOPY;  Surgeon: Grant Bi, MD;  Location: [facility] ENDOSCOPY;  Service: Gastroenterology;  Laterality: N/A;   COLONOSCOPY WITH  PROPOFOL  N/A 10/03/2020   Procedure: COLONOSCOPY WITH PROPOFOL ;  Surgeon: Grant Bi, MD;  Location: [facility] ENDOSCOPY;  Service: Gastroenterology;  Laterality: N/A;   CORONARY ANGIOPLASTY  1998, 2023   CORONARY/GRAFT ACUTE MI REVASCULARIZATION N/A 11/27/2021   Procedure: Coronary/Graft Acute MI Revascularization;  Surgeon: Anner Grant ORN, MD;  Location: [facility] CATH: (NSTEMI) - > 100% prox-mid LCx (Onyx Frontier DES 2.5 x 26 -> 2.7 mm, Ost  OM1 65%.  Ost-mid LAD 3 lesions 75%, 90%, 99% => DES PCI Onyx Frontier DES 2.5 x 34 -> 2.8 mm   ESOPHAGOGASTRODUODENOSCOPY (EGD) WITH PROPOFOL  N/A 10/03/2020   Procedure: ESOPHAGOGASTRODUODENOSCOPY (EGD) WITH PROPOFOL ;  Surgeon: Grant Bi, MD;  Location: [facility] ENDOSCOPY;  Service: Gastroenterology;  Laterality: N/A;   ESOPHAGOGASTRODUODENOSCOPY (EGD) WITH PROPOFOL  N/A 07/27/2023   Procedure: ESOPHAGOGASTRODUODENOSCOPY (EGD) WITH PROPOFOL ;  Surgeon: Grant Bi, MD;  Location: [facility] ENDOSCOPY;  Service: Gastroenterology;  Laterality: N/A;   FOOT SURGERY Bilateral 03/18/2020   FOOT SURGERY  08/032021   GALLBLADDER SURGERY  09/15/2015   Gallbladder Removal. Procedure done by Dr.Beverly. Per New Patient Packet   KIDNEY STONE SURGERY  09/14/1980   Too many to count. Per New Patient Packet 09/14/1980-09/15/1995   KIDNEY STONE SURGERY  09/15/1995   Too many to count. Per New Patient Packet   LEFT HEART CATH AND CORONARY ANGIOGRAPHY N/A 11/27/2021   Procedure: LEFT HEART CATH AND CORONARY ANGIOGRAPHY;  Surgeon: Anner Grant ORN, MD;  Location: [facility] CATH:  NSTEMI - 2 V CAD: 100% thrombotic prox-mid LCx (DES PCI), ost OM1 65%; Ost LAD 75% - prox LAD 90%, prox-mid 99% (DES PCI).  MIld diffuse RCA disease - calcicifed.  R-L collaterals filling LCx. EF ~45-50% with lateral HK. LVEDP 28 mmHg   LITHOTRIPSY  09/14/2014   Per New Patient Packet   LITHOTRIPSY  09/14/1996   No Stints Used. Per New Patient Packet   LITHOTRIPSY  09/14/1997   No Stints used. Per New Patient  Packet   PAIN PUMP IMPLANTATION     PAIN PUMP REMOVAL     SHOULDER SURGERY Right 1984   SIGMOIDOSCOPY  09/14/2017   Per New Patient Packet   SPINAL CORD STIMULATOR IMPLANT     SPINE SURGERY  1999, 2000   Fusion, Inst. Fusion   TONSILLECTOMY     TRANSTHORACIC ECHOCARDIOGRAM  11/28/2021   (in setting of non-STEMI): EF 50 to 55%.  Suspect inferior and lateral hypokinesis.  Indeterminate diastolic parameters.  Normal RV size and function.  Normal RAP.  Normal aortic and mitral valves with only mild MR and AI.  Normal RVP and RAP.   UPPER GI ENDOSCOPY     VIDEO BRONCHOSCOPY WITH ENDOBRONCHIAL ULTRASOUND N/A 01/15/2023   Procedure: VIDEO BRONCHOSCOPY WITH ENDOBRONCHIAL ULTRASOUND;  Surgeon: Parris Manna, MD;  Location: [facility] ORS;  Service: Thoracic;  Laterality: N/A;    Allergies  Allergies  Allergen Reactions   Ace Inhibitors     Other reaction(s): Cough   Fluoxetine Anxiety    made me fall asleep per pt bad headaches and makes  Me  Crazy historical allergy noted in [employer] made me fall asleep per pt bad headaches and makes  Me  Crazy Per New Patient Packet.     Metoclopramide     Other reaction(s): Other (See Comments), Other (See Comments), Unknown Tardive Dyskinesia  historical allergy noted in [employer] Tardive Dyskinesia  Per New Patient Packet.   Nalbuphine     Used Post Back surgery- Anesthesiologist Error. Patient had Narcotic Withdraw. Per New Patient Packet.    Other     Other reaction(s): Other (See Comments) Altered mental status in combo with narcotics at previous hospitalization - Full Withdrawal Symptoms Other reaction(s): Rash   Amoxicillin-Pot Clavulanate Nausea Only    Per New Patient Packet.   Doxazosin Rash    Other reaction(s): Other - See Comments, Rash UNKNOWN REACTION UNKNOWN REACTION    Duloxetine Nausea Only    Per New Patient Packet.    Penicillins Nausea Only    Per New Patient Packet.   Tamsulosin Itching and Anxiety     Restless, Flushing, Heavy Chest, Itching, Hyperactive mood and Anxiety. Unable to handle side effects. Per New Patient Packet.     Trazodone  And Nefazodone Itching, Anxiety and Rash    Headache. INCREASED MY ANXIETY AND HEARTRATE Flushing, tachycardia INCREASED MY ANXIETY AND HEARTRATE Per New Patient Packet.    Alirocumab   Other (See Comments)   Amlodipine     Shaking, unsure of reaction. Per New Patient Packet.     Cinoxacin     GI Intolerance, and Dizziness. Per New Patient Packet.    Ciprofloxacin     Other reaction(s): Unknown   Fludrocortisone  Other (See Comments)    Worsening headaches, GI issues, fatigue   Nebivolol     Other reaction(s): Unknown   Olanzapine     Headache and unable to sleep for 3 nights. Per New Patient Packet.    Olmesartan     Other reaction(s): Unknown   Pregabalin     Confusion, Lack of concentration, dizziness, and likely drowsiness. Per New Patient Packet.     Prostaglandins     Other reaction(s): Other (See Comments) Intolerance   Thyroid  Hormones     Other reaction(s): Other (See Comments) Thyroid  (Nature Thyroid ) contraindicated with some of your other medications.   Venlafaxine  Other (See Comments)    High blood pressure- hospitalized   venlafaxine    Venlafaxine  Hcl Er Hypertension   Zolpidem     Nightmares, Ineffective after 2 days. Per New Patient Packet.    Duloxetine Hcl     Other reaction(s): Rash   Fluoxetine Hcl     Other reaction(s): Rash   Nucynta  [Tapentadol ] Other (See Comments)    Vertigo    Phenytoin Anxiety    Hyperactivity, and Ineffective. Per New Patient Packet.     Home Medications    Prior to Admission medications   Medication Sig Start Date End Date Taking? Authorizing Provider  anastrozole  (ARIMIDEX ) 1 MG tablet 1/2 tablet (0.5 mg) by mouth once weekly 05/07/21   [provider]  aspirin  EC 81 MG tablet Take 1 tablet (81 mg total) by mouth daily. Swallow whole. 09/16/23   Anner Grant ORN, MD   Cholecalciferol  50 MCG (2000 UT) CAPS Take 1 tablet by mouth daily. 04/15/20   [provider]  cholestyramine  (QUESTRAN ) 4 g packet as needed (diarrea). 10/16/20   [provider]  Cyanocobalamin  (VITAMIN B-12) 5000 MCG LOZG Take 1 lozenge by mouth daily.     [provider]  DHEA 25 MG CAPS Take 25 mg by mouth daily.    [provider]  Digestive Aids Mixture (DIGESTION GB PO) Take 1 capsule by mouth daily.    [provider]  escitalopram  (LEXAPRO ) 20 MG tablet TAKE 1 TABLET(20 MG) BY MOUTH DAILY 01/07/24   Grant Angeline ORN, NP  ezetimibe -simvastatin  (VYTORIN ) 10-20 MG tablet Take 1 tablet by mouth daily. 10/19/23   Anner Grant ORN, MD  hydrALAZINE  (APRESOLINE ) 25 MG tablet Take 2 tabs p.o. in a.m. and q. afternoon, 1 tab p.o. in the evening 05/29/24   Grant Angeline ORN, NP  HYDROcodone -acetaminophen  (NORCO/VICODIN) 5-325 MG tablet Take 1 tablet by mouth every 6 (six) hours as needed for up to 5 days. 05/30/24 06/04/24  Levander Slate, MD  Hyoscyamine Sulfate SL 0.125 MG SUBL TAKE 1 TABLET UNDER TONGUE TWICE DAILY OR AS DIRECTED    [provider]  ipratropium (ATROVENT ) 0.03 % nasal spray Place 2 sprays into both nostrils every 12 (twelve) hours. 09/20/23   Kennyth Domino, FNP  ketoconazole (NIZORAL) 2 % shampoo Apply 1 Application topically once. 08/02/23   [provider]  Loperamide  HCl (IMODIUM  PO) Take by mouth as needed.     [provider]  MAGNESIUM  BISGLYCINATE PO Take by mouth daily.    [provider]  meclizine  (ANTIVERT ) 25 MG tablet Take 1  tablet (25 mg total) by mouth 3 (three) times daily as needed for dizziness. 05/30/24   Levander Slate, MD  melatonin 5 MG TABS Take 5 mg by mouth at bedtime.    [provider]  MIEBO 1.338 GM/ML SOLN Place 1 drop into both eyes 4 (four) times daily. 09/23/23   [provider]  NONFORMULARY OR COMPOUNDED ITEM Young-Mix Papaverine 30mg , Phentolamine 1mg    Dosage: Inject  0.25cc-0.5cc as need for ED   Vial 1ml   Qty #5 Refills 6   [facility] [phone] Fax [phone] 05/28/23   Maurine Lukes, PA-C  Nutritional Supplements (NUTRITIONAL SUPPLEMENT PO) Take 1 capsule by mouth daily. [name]    [provider]  PREVIDENT 5000 DRY MOUTH 1.1 % GEL dental gel SMARTSIG:To Teeth 3 Times Daily 05/27/23   [provider]  Probiotic Product (PROBIOTIC DAILY PO) Take by mouth daily.    [provider]  propranolol  (INDERAL ) 40 MG tablet Take 40 mg by mouth 2 (two) times daily.    [provider]  pyridostigmine  (MESTINON ) 60 MG tablet TAKE 1 TABLET(60 MG) BY MOUTH TWICE DAILY Patient taking differently: Take 30 mg by mouth daily. PER PT 03/24/24   Karamalegos, Marsa PARAS, DO  RABEprazole  (ACIPHEX ) 20 MG tablet Take 2 tablets (40 mg total) by mouth 2 (two) times daily. 08/23/23 08/17/24  Grant Bi, MD  sucralfate  (CARAFATE ) 1 g tablet Take 1 tablet (1 g total) by mouth 4 (four) times daily. 08/02/23 07/27/24  Grant Bi, MD  Suvorexant  (BELSOMRA ) 10 MG TABS Take 1 tablet (10 mg total) by mouth at bedtime as needed. 05/25/24   Grant Angeline ORN, NP  testosterone  cypionate (DEPOTESTOSTERONE CYPIONATE) 200 MG/ML injection Inject 80 mg into the muscle every 14 (fourteen) days. 11/27/20   [provider]  triamcinolone cream (KENALOG) 0.1 % as needed (skin irritation, insect bites).    [provider]  valACYclovir  (VALTREX ) 1000 MG tablet Take 2 tabs p.o. and repeat in 12 hours as needed for cold sore 10/29/21   Grant Angeline ORN, NP  Vibegron  (GEMTESA ) 75 MG TABS Take 1 tablet (75 mg total) by mouth daily. 05/24/24   Francisca Redell BROCKS, MD   Physical Exam  Vital Signs:  Grant Young does not have vital signs available for review today. Given telephonic nature of communication, physical exam is limited. AAOx3. NAD. Normal affect.  Speech and respirations are unlabored. Accessory Clinical  Findings  None Assessment & Plan  1.  Preoperative Cardiovascular Risk Assessment: Grant Young perioperative risk of a major cardiac event is 0.9% according to the Revised Cardiac Risk Index (RCRI).  Therefore, he is at low risk for perioperative complications.   His functional capacity is fair at 5.56 METs according to the Duke Activity Status Index (DASI). Recommendations: According to ACC/AHA guidelines, no further cardiovascular testing needed.  The patient may proceed to surgery at acceptable risk.   Antiplatelet and/or Anticoagulation Recommendations: None requested.   The patient was advised that if he develops new symptoms prior to surgery to contact our office to arrange for a follow-up visit, and he verbalized understanding. A copy of this note will be routed to requesting surgeon. Time:   Today, I have spent 20 minutes with the patient with telehealth technology discussing medical history, symptoms, and management plan.    Makaia Rappa D Judithe Keetch, NP  05/31/2024, 12:02 PM

## 2024-06-06 ENCOUNTER — Encounter (INDEPENDENT_AMBULATORY_CARE_PROVIDER_SITE_OTHER): Payer: Self-pay | Admitting: *Deleted

## 2024-06-06 ENCOUNTER — Encounter: Payer: Self-pay | Admitting: Student in an Organized Health Care Education/Training Program

## 2024-06-06 ENCOUNTER — Ambulatory Visit
Attending: Student in an Organized Health Care Education/Training Program | Admitting: Student in an Organized Health Care Education/Training Program

## 2024-06-06 VITALS — BP 161/72 | HR 69 | Temp 97.9°F | Resp 16 | Ht 69.0 in | Wt 141.0 lb

## 2024-06-06 DIAGNOSIS — G8929 Other chronic pain: Secondary | ICD-10-CM | POA: Diagnosis present

## 2024-06-06 DIAGNOSIS — G894 Chronic pain syndrome: Secondary | ICD-10-CM | POA: Diagnosis present

## 2024-06-06 DIAGNOSIS — R1031 Right lower quadrant pain: Secondary | ICD-10-CM | POA: Diagnosis not present

## 2024-06-06 MED ORDER — JOURNAVX 50 MG PO TABS: 50.0000 mg | ORAL_TABLET | Freq: Two times a day (BID) | ORAL | 2 refills | Status: DC | PRN

## 2024-06-06 NOTE — Progress Notes (Signed)
 PROVIDER NOTE: Interpretation of information contained herein should be left to medically-trained personnel. Specific patient instructions are provided elsewhere under Patient Instructions section of medical record. This document was created in part using AI and STT-dictation technology, any transcriptional errors that may result from this process are unintentional.  Patient: Grant Young  Service: E/M   PCP: Antonette Angeline LELON, NP  DOB: 1945/10/25  DOS: 06/06/2024  Provider: Wallie Sherry, MD  MRN: [id number]  Delivery: Face-to-face  Specialty: Interventional Pain Management  Type: Established Patient  Setting: Ambulatory outpatient facility  Specialty designation: 09  Referring Prov.: Antonette Angeline LELON, NP  Location: Outpatient office facility       History of present illness (HPI) [name], a 78 y.o. year old male, is here today because of his Abdominal pain, chronic, right lower quadrant [R10.31, G89.29]. [name] primary complain today is Abdominal Pain, Headache, and Peripheral Neuropathy (B/l below knees down to dorsal feet, constant burning/clenching/numbness)  Pertinent problems: Grant Young has Lumbar spondylosis and Chronic pain syndrome on their pertinent problem list.  Pain Assessment: Severity of Chronic pain is reported as a 7 /10. Location: Abdomen Lower/DENIES. Onset: More than a month ago. Quality: Aching, Sharp. Timing: Constant. Modifying factor(s): NORCO, heating pad, ice packs. Vitals:  height is 5' 9 (1.753 m) and weight is 141 lb (64 kg). His temperature is 97.9 F (36.6 C). His blood pressure is 161/72 (abnormal) and his pulse is 69. His respiration is 16 and oxygen saturation is 97%.  BMI: Estimated body mass index is 20.82 kg/m as calculated from the following:   Height as of this encounter: 5' 9 (1.753 m).   Weight as of this encounter: 141 lb (64 kg).  Last encounter: 12/29/2023. Last procedure: 11/17/2023.  Reason for encounter:   Patient presents with right  lower quadrant pain.  Discussed the use of AI scribe software for clinical note transcription with the patient, who gave verbal consent to proceed.  History of Present Illness   Grant Young is a 78 year old male with a history of reflux who presents with abdominal pain more pronounced in the right lower quadrant.  He has been experiencing abdominal pain for the past three months, describing it as a sharp ache that seems to extend into the intestines. The pain is associated with changes in bowel movements, alternating between diarrhea and constipation. He has sought emergency care twice, once at [facility] in [place]  and once at [facility] ER, where he underwent an abdominal CT The results were inconclusive; the ER doctor mentioned the possibility of pouches in the stomach or bowel and recommended follow-up with a GI doctor.  He has a history of reflux diagnosed earlier this year, for which he underwent an endoscopy last November. The endoscopy revealed three non-bleeding ulcers, and he was prescribed double Aciphex  twice a day and four Carafate  a day. His primary care provider initially prescribed Bentyl , which was later changed to hyoscyamine. He continues to take Aciphex , now reduced to one tablet twice a day, and hyoscyamine.  He has been on a waiting list to see a GI doctor, initially scheduled for January but moved up to October 14th after persistent follow-up. Norco, prescribed during his last ER visit, provided some relief from the pain.  He inquired about a new medication, Journavax, for potential off-label use for his chronic condition, having learned about it from an [place] bulletin. He is interested in exploring this option as he continues to experience persistent abdominal  pain despite ongoing treatment for reflux.         Pharmacotherapy Assessment   Hydrocodone  IR 10 mg BID-TID prn   Monitoring: [place] PMP: PDMP not reviewed this encounter.       Pharmacotherapy: No side-effects or  adverse reactions reported. Compliance: No problems identified. Effectiveness: Clinically acceptable.  Dayna Pulling, RN  06/06/2024  1:53 PM  Sign when Signing Visit Safety precautions to be maintained throughout the outpatient stay will include: orient to surroundings, keep bed in low position, maintain call bell within reach at all times, provide assistance with transfer out of bed and ambulation.   UDS:  Summary  Date Value Ref Range Status  09/17/2020 Note  Final    Comment:    ==================================================================== Compliance Drug Analysis, Ur ==================================================================== Specimen Alert Note: Urinary creatinine is low; ability to detect some drugs may be compromised. Interpret results with caution. (Creatinine) ==================================================================== Test                             Result       Flag       Units  Drug Present and Declared for Prescription Verification   Lorazepam                       1107         EXPECTED   ng/mg creat    Source of lorazepam  is a scheduled prescription medication.    Butalbital                      PRESENT      EXPECTED   Citalopram                     PRESENT      EXPECTED   Desmethylcitalopram            PRESENT      EXPECTED    Desmethylcitalopram is an expected metabolite of citalopram or the    enantiomeric form, escitalopram .    Acetaminophen                   PRESENT      EXPECTED   Propranolol                     PRESENT      EXPECTED  Drug Present not Declared for Prescription Verification   Oxazepam                       300          UNEXPECTED ng/mg creat   Temazepam                      357          UNEXPECTED ng/mg creat    Oxazepam and temazepam are expected metabolites of diazepam .    Oxazepam is also an expected metabolite of other benzodiazepine    drugs, including chlordiazepoxide, prazepam, clorazepate, halazepam,    and  temazepam.  Oxazepam and temazepam are available as scheduled    prescription medications.  Drug Absent but Declared for Prescription Verification   Hydrocodone                     Not Detected UNEXPECTED ng/mg creat   Tizanidine   Not Detected UNEXPECTED    Tizanidine , as indicated in the declared medication list, is not    always detected even when used as directed.    Amitriptyline                   Not Detected UNEXPECTED   Prochlorperazine                Not Detected UNEXPECTED   Salicylate                     Not Detected UNEXPECTED    Aspirin , as indicated in the declared medication list, is not always    detected even when used as directed.  ==================================================================== Test                      Result    Flag   Units      Ref Range   Creatinine              14        LL     mg/dL      >=79 ==================================================================== Declared Medications:  The flagging and interpretation on this report are based on the  following declared medications.  Unexpected results may arise from  inaccuracies in the declared medications.   **Note: The testing scope of this panel includes these medications:   Amitriptyline  (Elavil )  Butalbital  (Fioricet )  Butalbital  (Fiorinal )  Escitalopram  (Lexapro )  Hydrocodone  (Norco)  Lorazepam  (Ativan )  Prochlorperazine  (Compazine )  Propranolol  (Inderal )   **Note: The testing scope of this panel does not include small to  moderate amounts of these reported medications:   Acetaminophen  (Fioricet )  Acetaminophen  (Norco)  Aspirin  (Fiorinal )  Tizanidine  (Zanaflex )   **Note: The testing scope of this panel does not include the  following reported medications:   Anastrozole  (Arimidex )  Caffeine  (Fioricet )  Caffeine  (Fiorinal )  Chlorthalidone  (Hygroton )  Cholecalciferol   Cholestyramine  (Questran )  Dicyclomine  (Bentyl )  Ezetimibe  (Zetia )  Hydrocortisone    Melatonin  Ondansetron  (Zofran )  Potassium (Klor-Con )  Rabeprazole  (Aciphex )  Sucralfate  (Carafate )  Valacyclovir  (Valtrex )  Vitamin B12 ==================================================================== For clinical consultation, please call [phone]. ====================================================================     No results found for: CBDTHCR No results found for: D8THCCBX No results found for: D9THCCBX  ROS  Constitutional: Denies any fever or chills Gastrointestinal: No reported hemesis, hematochezia, vomiting, or acute GI distress Musculoskeletal: Denies any acute onset joint swelling, redness, loss of ROM, or weakness Neurological: No reported episodes of acute onset apraxia, aphasia, dysarthria, agnosia, amnesia, paralysis, loss of coordination, or loss of consciousness  Medication Review  Cholecalciferol , DHEA, Digestive Aids Mixture, Hyoscyamine Sulfate SL, Loperamide  HCl, Magnesium  Bisglycinate, NONFORMULARY OR COMPOUNDED ITEM, Nutritional Supplements, Perfluorohexyloctane, Probiotic Product, RABEprazole , Suvorexant , Suzetrigine , Vibegron , Vitamin B-12, anastrozole , aspirin  EC, cholestyramine , escitalopram , ezetimibe -simvastatin , hydrALAZINE , ipratropium, ketoconazole, meclizine , melatonin, propranolol , pyridostigmine , sodium fluoride, sucralfate , testosterone  cypionate, traZODone , triamcinolone cream, and valACYclovir   History Review  Allergy: Grant Young is allergic to ace inhibitors, fluoxetine, metoclopramide, nalbuphine, other, amoxicillin-pot clavulanate, doxazosin, duloxetine, penicillins, tamsulosin, trazodone  and nefazodone, alirocumab , amlodipine, cinoxacin, ciprofloxacin, fludrocortisone , nebivolol, olanzapine, olmesartan, pregabalin, prostaglandins, thyroid  hormones, venlafaxine , venlafaxine  hcl er, zolpidem, duloxetine hcl, fluoxetine hcl, nucynta  [tapentadol ], and phenytoin. Drug: Grant Young  reports that he does not currently use drugs. Alcohol:   reports current alcohol use. Tobacco:  reports that he has never smoked. He has never been exposed to tobacco smoke. He has never used smokeless tobacco. Social: Grant Young  reports that he has never smoked. He has never been exposed  to tobacco smoke. He has never used smokeless tobacco. He reports current alcohol use. He reports that he does not currently use drugs. Medical:  has a past medical history of Allergy, Anemia, Aneurysm of ascending aorta, Anxiety, Aortic atherosclerosis, Cataract (2015), Chronic back pain, Chronic kidney disease (1980's), Clotting disorder (01-2022), Coronary artery disease, Depression, Erectile dysfunction, GERD (gastroesophageal reflux disease), h/o Lyme disease, Headache, Hiatal hernia, History of bilateral cataract extraction (01/2021), History of gastritis, History of kidney stones, History of neuropathy, Hyperlipidemia LDL goal <70, Hypertension, Insomnia, Left lower lobe pulmonary nodule, Long term current use of antithrombotics/antiplatelets, Long term current use of aromatase inhibitor, Lumbar radicular pain, Myocardial infarction [facility]), NSTEMI (non-ST elevated myocardial infarction) (HCC) (11/27/2021), Overactive bladder, Pneumonia, PONV (postoperative nausea and vomiting), Recurrent herpes labialis, Skin cancer, basal cell, Spinal cord stimulator status, Squamous cell skin cancer, Substance abuse (HCC), and Syncope and collapse (2022). Surgical: Grant Young  has a past surgical history that includes Kidney stone surgery (09/14/1980); Kidney stone surgery (09/15/1995); Lithotripsy (09/14/2014); Lithotripsy (09/14/1996); Lithotripsy (09/14/1997); Gallbladder surgery (09/15/2015); Sigmoidoscopy (09/14/2017); Colonoscopy (09/15/2015); Cholecystectomy (2017); Shoulder surgery (Right, 1984); Back surgery; Spinal cord stimulator implant; Pain pump implantation; Pain pump removal; Tonsillectomy; Foot surgery (Bilateral, 03/18/2020); Foot surgery (08/032021); Upper gi endoscopy;  Colonoscopy with propofol  (N/A, 10/03/2020); Esophagogastroduodenoscopy (egd) with propofol  (N/A, 10/03/2020); Cataract extraction w/PHACO (Left, 01/14/2021); Cataract extraction w/PHACO (Right, 01/28/2021); Coronary/Graft Acute MI Revascularization (N/A, 11/27/2021); LEFT HEART CATH AND CORONARY ANGIOGRAPHY (N/A, 11/27/2021); Excision basal cell carcinoma; transthoracic echocardiogram (11/28/2021); Video bronchoscopy with endobronchial ultrasound (N/A, 01/15/2023); Esophagogastroduodenoscopy (egd) with propofol  (N/A, 07/27/2023); biopsy (07/27/2023); Colonoscopy (N/A, 01/26/2024); Coronary angioplasty (1998, 2023); and Spine surgery (1999, 2000). Family: family history includes Anxiety disorder in his daughter; Dementia in his mother; Diabetes in his mother; Heart disease in his father; Heart failure in his father; Hypertension in his father; Kidney Stones in his daughter; OCD in his daughter; Stroke in his father and mother; Vision loss in his father.  Laboratory Chemistry Profile   Renal Lab Results  Component Value Date   BUN 14 05/30/2024   CREATININE 0.89 05/30/2024   BCR SEE NOTE: 04/28/2024   GFRAA 101 05/20/2020   GFRNONAA >60 05/30/2024    Hepatic Lab Results  Component Value Date   AST 17 05/30/2024   ALT 14 05/30/2024   ALBUMIN 3.8 05/30/2024   ALKPHOS 81 05/30/2024   AMYLASE 32 09/18/2022   LIPASE 36 05/30/2024    Electrolytes Lab Results  Component Value Date   NA 143 05/30/2024   K 4.2 05/30/2024   CL 107 05/30/2024   CALCIUM  9.3 05/30/2024   MG 2.3 05/30/2024   PHOS 3.6 11/30/2021    Bone Lab Results  Component Value Date   VD25OH 55.7 07/06/2023   TESTOSTERONE  33.2 05/20/2020    Inflammation (CRP: Acute Phase) (ESR: Chronic Phase) Lab Results  Component Value Date   CRP 1 07/06/2023   ESRSEDRATE 2 07/06/2023   LATICACIDVEN 1.4 08/18/2022         Note: Above Lab results reviewed.  Recent Imaging Review  CT ABDOMEN PELVIS W CONTRAST CLINICAL DATA:   Acute abdominal pain.  EXAM: CT ABDOMEN AND PELVIS WITH CONTRAST  TECHNIQUE: Multidetector CT imaging of the abdomen and pelvis was performed using the standard protocol following bolus administration of intravenous contrast.  RADIATION DOSE REDUCTION: This exam was performed according to the departmental dose-optimization program which includes automated exposure control, adjustment of the mA and/or kV according to patient size and/or use of iterative reconstruction technique.  CONTRAST:  OMNIPAQUE  IOHEXOL  300 MG/ML  SOLN  COMPARISON:  Abdominopelvic CT 04/20/2023  FINDINGS: Lower chest: Nodular opacities in the lung bases have diminished in size or are stable from prior exam, previously assessed by chest CT and favoring chronic indolent infection. Small fat containing Bochdalek hernia on the right. No enlarging pulmonary nodule  Hepatobiliary: No suspicious liver lesion. Focal fatty infiltration adjacent to the falciform ligament. Stable biliary tree post cholecystectomy.  Pancreas: No ductal dilatation or inflammation.  Spleen: Normal in size without focal abnormality.  Adrenals/Urinary Tract: No adrenal nodule. Nonobstructing bilateral renal calculi. No hydronephrosis. Left renal cyst. No further follow-up imaging is recommended. No evidence of renal inflammation. Moderate urinary bladder distension. No bladder wall thickening.  Stomach/Bowel: Small hiatal hernia. Duodenal diverticulum. Few fluid-filled loops of small bowel in the pelvis without wall thickening or inflammation. No obstruction small to moderate colonic stool burden. Occasional colonic diverticula without diverticulitis.  Vascular/Lymphatic: Aortic atherosclerosis without aneurysm. Patent portal vein. No suspicious lymphadenopathy.  Reproductive: Prostatic calcifications.  Other: No ascites. Left lateral spinal stimulator battery pack with abandoned leads on the right,  unchanged.  Musculoskeletal: Postsurgical change at the lumbosacral junction. No acute osseous findings.  IMPRESSION: 1. Few fluid-filled loops of small bowel in the pelvis without wall thickening or inflammation, can be seen with enteritis. 2. Nonobstructing bilateral renal calculi. 3. Colonic diverticulosis without diverticulitis. 4. Nodular opacities in the lung bases have diminished in size or are stable from prior exam, previously assessed by chest CT and favoring chronic indolent infection.  Aortic Atherosclerosis (ICD10-I70.0).  Electronically Signed   By: Andrea Gasman M.D.   On: 05/30/2024 22:54 CT Head Wo Contrast CLINICAL DATA:  Headache, new onset (Age >= 51y). Abd pain, headache, dizziness x 1 week. Last BM 05/30/2024. Decreased urine output.  EXAM: CT HEAD WITHOUT CONTRAST  TECHNIQUE: Contiguous axial images were obtained from the base of the skull through the vertex without intravenous contrast.  RADIATION DOSE REDUCTION: This exam was performed according to the departmental dose-optimization program which includes automated exposure control, adjustment of the mA and/or kV according to patient size and/or use of iterative reconstruction technique.  COMPARISON:  CT head 01/13/2024  FINDINGS: Brain:  Right frontal, parietal, occipital encephalomalacia. Left frontotemporal encephalomalacia. Patchy and confluent areas of decreased attenuation are noted throughout the deep and periventricular white matter of the cerebral hemispheres bilaterally, compatible with chronic microvascular ischemic disease. No evidence of large-territorial acute infarction. No parenchymal hemorrhage. No mass lesion. No extra-axial collection.  No mass effect or midline shift. No hydrocephalus. Basilar cisterns are patent.  Vascular: No hyperdense vessel. Atherosclerotic calcifications are present within the cavernous internal carotid and vertebral arteries.  Skull: No  acute fracture or focal lesion.  Sinuses/Orbits: Left maxillary sinus mucosal thickening. Paranasal sinuses and mastoid air cells are clear. The orbits are unremarkable.  Other: None.  IMPRESSION: No acute intracranial abnormality.  Electronically Signed   By: Morgane  Naveau M.D.   On: 05/30/2024 22:47 Note: Reviewed        Physical Exam  Vitals: BP (!) 161/72   Pulse 69   Temp 97.9 F (36.6 C)   Resp 16   Ht 5' 9 (1.753 m)   Wt 141 lb (64 kg)   SpO2 97%   BMI 20.82 kg/m  BMI: Estimated body mass index is 20.82 kg/m as calculated from the following:   Height as of this encounter: 5' 9 (1.753 m).   Weight as of this encounter: 141 lb (64  kg). Ideal: Ideal body weight: 70.7 kg (155 lb 13.8 oz) General appearance: Well nourished, well developed, and well hydrated. In no apparent acute distress Mental status: Alert, oriented x 3 (person, place, & time)       Respiratory: No evidence of acute respiratory distress Eyes: PERLA   Assessment   Diagnosis  1. Abdominal pain, chronic, right lower quadrant   2. Right lower quadrant abdominal pain   3. Chronic pain syndrome      Updated Problems: Problem  Chronic Pain Syndrome  Right Lower Quadrant Abdominal Pain    Plan of Care  Assessment and Plan    Abdominal Pain   He has experienced chronic abdominal pain for three months, mainly in the right lower quadrant. Previous ER visits and imaging have not yielded a definitive diagnosis. The differential diagnosis includes diverticulosis, functional abdominal pain, or other gastrointestinal issues. An earlier endoscopy revealed three non-bleeding ulcers, treated with Aciphex  and Carafate . Current medications include hyoscyamine and Aciphex , with pain partially relieved by Norco. He will see Dr. Deatrice Dine, a GI specialist, for further evaluation, including potential upper endoscopy or colonoscopy. Journavax was discussed as a potential off-label treatment for chronic  conditions due to its minimal side effects and current approval for acute pain. It will be prescribed for acute abdominal pain with two refills.  If the GI workup is negative, nerve blocks will be considered.    Gastroesophageal Reflux Disease (GERD)   He has GERD with a previous endoscopy showing three non-bleeding ulcers, managed with Aciphex  and Carafate . Symptoms have not fully resolved, indicating a possible need for further evaluation. He will continue Aciphex  once daily and Carafate  as previously prescribed.    Follow-up   A follow-up plan was discussed to ensure continuity of care and further evaluation of abdominal pain. He will follow up with the GI specialist  and return to discuss nerve block options if the GI workup is negative.        Grant Young has a current medication list which includes the following long-term medication(s): escitalopram , ezetimibe -simvastatin , hydralazine , ipratropium, pyridostigmine , rabeprazole , sucralfate , testosterone  cypionate, and trazodone .  Pharmacotherapy (Medications Ordered): Meds ordered this encounter  Medications   Suzetrigine  (JOURNAVX ) 50 MG TABS    Sig: Take 50 mg by mouth every 12 (twelve) hours as needed for up to 15 days.    Dispense:  30 tablet    Refill:  2   Orders:  Orders Placed This Encounter  Procedures   Ambulatory referral to Gastroenterology    Referral Priority:   Routine    Referral Type:   Consultation    Referral Reason:   Specialty Services Required    Referred to Provider:   Dine Deatrice FALCON, MD    Number of Visits Requested:   1    Return for patient will call to schedule F2F appt prn.    Recent Visits No visits were found meeting these conditions. Showing recent visits within past 90 days and meeting all other requirements Today's Visits Date Type Provider Dept  06/06/24 Office Visit Marcelino Nurse, MD [facility]  Showing today's visits and meeting all other requirements Future  Appointments No visits were found meeting these conditions. Showing future appointments within next 90 days and meeting all other requirements  I discussed the assessment and treatment plan with the patient. The patient was provided an opportunity to ask questions and all were answered. The patient agreed with the plan and demonstrated an understanding of the instructions.  Patient advised to call back or seek an in-person evaluation if the symptoms or condition worsens.  Duration of encounter: .  Total time on encounter, as per AMA guidelines included both the face-to-face and non-face-to-face time personally spent by the physician and/or other qualified health care professional(s) on the day of the encounter (includes time in activities that require the physician or other qualified health care professional and does not include time in activities normally performed by clinical staff). Physician's time may include the following activities when performed: Preparing to see the patient (e.g., pre-charting review of records, searching for previously ordered imaging, lab work, and nerve conduction tests) Review of prior analgesic pharmacotherapies. Reviewing PMP Interpreting ordered tests (e.g., lab work, imaging, nerve conduction tests) Performing post-procedure evaluations, including interpretation of diagnostic procedures Obtaining and/or reviewing separately obtained history Performing a medically appropriate examination and/or evaluation Counseling and educating the patient/family/caregiver Ordering medications, tests, or procedures Referring and communicating with other health care professionals (when not separately reported) Documenting clinical information in the electronic or other health record Independently interpreting results (not separately reported) and communicating results to the patient/ family/caregiver Care coordination (not separately reported)  Note by: Wallie Sherry,  MD (TTS and AI technology used. I apologize for any typographical errors that were not detected and corrected.) Date: 06/06/2024; Time: 2:36 PM

## 2024-06-06 NOTE — Progress Notes (Signed)
 Safety precautions to be maintained throughout the outpatient stay will include: orient to surroundings, keep bed in low position, maintain call bell within reach at all times, provide assistance with transfer out of bed and ambulation.

## 2024-06-13 ENCOUNTER — Telehealth: Payer: Self-pay | Admitting: Student in an Organized Health Care Education/Training Program

## 2024-06-13 ENCOUNTER — Encounter: Payer: Self-pay | Admitting: Student in an Organized Health Care Education/Training Program

## 2024-06-13 NOTE — Telephone Encounter (Signed)
 PT called stated that the Journavx  medication isn't helping with his pain. PT stated that he is still in a lot of pain. PT wants to know what else can be offer to help with his pain. Please give patient a call. TY

## 2024-06-14 ENCOUNTER — Telehealth (INDEPENDENT_AMBULATORY_CARE_PROVIDER_SITE_OTHER): Payer: Self-pay | Admitting: Gastroenterology

## 2024-06-14 ENCOUNTER — Encounter (INDEPENDENT_AMBULATORY_CARE_PROVIDER_SITE_OTHER): Payer: Self-pay | Admitting: Gastroenterology

## 2024-06-14 ENCOUNTER — Ambulatory Visit (INDEPENDENT_AMBULATORY_CARE_PROVIDER_SITE_OTHER): Admitting: Gastroenterology

## 2024-06-14 VITALS — BP 163/72 | HR 61 | Temp 97.8°F | Ht 69.0 in | Wt 146.4 lb

## 2024-06-14 DIAGNOSIS — K219 Gastro-esophageal reflux disease without esophagitis: Secondary | ICD-10-CM | POA: Diagnosis not present

## 2024-06-14 DIAGNOSIS — Z1211 Encounter for screening for malignant neoplasm of colon: Secondary | ICD-10-CM | POA: Insufficient documentation

## 2024-06-14 DIAGNOSIS — R1013 Epigastric pain: Secondary | ICD-10-CM | POA: Insufficient documentation

## 2024-06-14 DIAGNOSIS — K257 Chronic gastric ulcer without hemorrhage or perforation: Secondary | ICD-10-CM | POA: Insufficient documentation

## 2024-06-14 DIAGNOSIS — Z9049 Acquired absence of other specified parts of digestive tract: Secondary | ICD-10-CM | POA: Diagnosis not present

## 2024-06-14 DIAGNOSIS — R1084 Generalized abdominal pain: Secondary | ICD-10-CM | POA: Insufficient documentation

## 2024-06-14 MED ORDER — LINACLOTIDE 72 MCG PO CAPS: 72.0000 ug | ORAL_CAPSULE | Freq: Every day | ORAL | Status: DC

## 2024-06-14 NOTE — Telephone Encounter (Signed)
 Medication Samples have been provided to the patient.  Drug name: Linzess       Strength: 72 mcg     Qty: 2 boxes LOT: [id number]  Exp.Date: 01/28  Dosing instructions: take one by mouth daily  The patient has been instructed regarding the correct time, dose, and frequency of taking this medication, including desired effects and most common side effects.   [name] 3:53 PM 06/14/2024

## 2024-06-14 NOTE — Progress Notes (Signed)
 Meriel Kelliher Faizan Sakoya Win , M.D. Gastroenterology & Hepatology [facility] Gastroenterology [address] [place], [place] [address] Primary Care Physician: Antonette Angeline ORN, NP [address] [place] [address]  Chief Complaint: Abdominal pain  History of Present Illness:  Grant Young is a 78 y.o. male with coronary artery disease status post PCI on aspirin , chronic back pain/neuropathy, ascending aortic aneurysm follows with pain management who presents for evaluation of chronic progressive abdominal pain  Patient was last seen by Dr. Therisa 09/2023, patient had underwent upper endoscopy last year and colonoscopy 01/2024 (inadequate prep; patient only drank the prep )   Patient reports symptoms beginning November 2024 and since then has been progressive.  He reports pain is located at the periumbilical area and would radiate to lower abdomen.  Reports pain is worse in an empty stomach and relieves with food intake.  Patient had been to ER multiple times.  Patient had a CT abdomen and CT head not explain patient's persistent symptoms  Patient was previously on Aciphex  with good control of his symptoms but despite taking it twice daily patient now continues to be symptomatic Patient was previously given Linzess  72 mcg but due to diarrhea had to stop.  As per the patient he had tried Bentyl , Levsin and IBgard without any relief abdominal pain  01/2024 colonoscopy - Preparation of the colon was inadequate. - Stool in the entire examined colon. - No specimens collected.  Repeat 3 months   10/03/2020: EGD: Normal study, colonoscopy: Internal hemorrhoids noted medium in size.  No polyps seen random colon biopsies taken which were negative for microscopic colitis.  Biopsies of the terminal ileum were also normal.    3 rounds of hemorrhoidal banding completed in October 2022  07/27/2023: EGD: 3 non bleeding gastric ulcers small in size seen at the pre pyloric area- bx taken  -acute gastritis- no H pylori   Surgical: Cholecystectomy  Recent lab work with normal liver enzymes negative alpha gal hemoglobin 15.4 Ferritin : 23 (03/2024)  Past Medical History: 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   Kidney Stones   Clotting disorder 01-2022   Plavix    Coronary artery disease    a.) s/p PCI with DES x 2 (pLCx and o-mLAD) 11/27/2021   Depression    Erectile dysfunction    a.) Bi-mix (papaverine + phentolamine) injections + exogenous testosterone  injections   GERD (gastroesophageal reflux disease)    h/o Lyme disease    Headache    Hiatal hernia    History of bilateral cataract extraction 01/2021   History of gastritis    History of kidney stones    History of neuropathy    Hyperlipidemia LDL goal <70    a.) CAD with NSTEMI -->  intolerant of statins with statin myopathy and memory issues.;  Also intolerant of Repatha    Hypertension    Labile blood pressures but dizziness with blood pressures in the normal range; intolerant of most medications including ARB's, ACE inhibitor's, amlodipine most beta-blockers other than propranolol .   Insomnia    a.) takes malatonin PRN   Left lower lobe pulmonary nodule  a.) chest CT 12/07/2022: nodules x 2 posteromedial LLL;  8 x 11 mm and 8 x 10 mm   Long term current use of antithrombotics/antiplatelets    a.) DAPT (ASA + clopidogrel )   Long term current use of aromatase inhibitor    a,) anastrozole  --> estridiol suppression secondary to exogenous testosterone  use   Lumbar radicular pain    Myocardial infarction (HCC)    a.) MI x 2 - 1990 * 1999 - PTCA   NSTEMI (non-ST elevated myocardial infarction) (HCC) 11/27/2021   a.) LHC 11/28/2021: sequential 75%  o-pLAD, 70% OM2, 80% p-mLAD, 99% mLAD, 100% pLCx --> PCI placing a 2.5 x 26 mm Onyx Frontier DES to pLCx and a 2.5 x 24 mm Onyx Frontier DES to the o-m LAD (covering 3 lesions)   Overactive bladder    Pneumonia    PONV (postoperative nausea and vomiting)    Recurrent herpes labialis    a.) has suppressive valacyclovir  to use PRN   Skin cancer, basal cell    Spinal cord stimulator status    01/08/21 - not currently using.   Squamous cell skin cancer    Substance abuse (HCC)    Syncope and collapse 2022    Past Surgical History: Past Surgical History:  Procedure Laterality Date   BACK SURGERY     BASAL CELL CARCINOMA EXCISION     BIOPSY  07/27/2023   Procedure: BIOPSY;  Surgeon: Therisa Bi, MD;  Location: [facility] ENDOSCOPY;  Service: Gastroenterology;;   CATARACT EXTRACTION W/PHACO Left 01/14/2021   Procedure: CATARACT EXTRACTION PHACO AND INTRAOCULAR LENS PLACEMENT (IOC) LEFT VIVITY TORIC LENS 8.75 00:56.7;  Surgeon: Jaye Fallow, MD;  Location: [facility];  Service: Ophthalmology;  Laterality: Left;   CATARACT EXTRACTION W/PHACO Right 01/28/2021   Procedure: CATARACT EXTRACTION PHACO AND INTRAOCULAR LENS PLACEMENT (IOC) RIGHT VIVITY TORIC LENS;  Surgeon: Jaye Fallow, MD;  Location: [facility];  Service: Ophthalmology;  Laterality: Right;  6.54 00:46.4   CHOLECYSTECTOMY  2017   COLONOSCOPY  09/15/2015   Per New Patient Packet   COLONOSCOPY N/A 01/26/2024   Procedure: COLONOSCOPY;  Surgeon: Therisa Bi, MD;  Location: [facility] ENDOSCOPY;  Service: Gastroenterology;  Laterality: N/A;   COLONOSCOPY WITH PROPOFOL  N/A 10/03/2020   Procedure: COLONOSCOPY WITH PROPOFOL ;  Surgeon: Therisa Bi, MD;  Location: [facility] ENDOSCOPY;  Service: Gastroenterology;  Laterality: N/A;   CORONARY ANGIOPLASTY  1998, 2023   CORONARY/GRAFT ACUTE MI REVASCULARIZATION N/A 11/27/2021   Procedure: Coronary/Graft Acute MI Revascularization;  Surgeon: Anner Alm ORN, MD;  Location: [name] CATH:  (NSTEMI) - > 100% prox-mid LCx (Onyx Frontier DES 2.5 x 26 -> 2.7 mm, Ost  OM1 65%.  Ost-mid LAD 3 lesions 75%, 90%, 99% => DES PCI Onyx Frontier DES 2.5 x 34 -> 2.8 mm   ESOPHAGOGASTRODUODENOSCOPY (EGD) WITH PROPOFOL  N/A 10/03/2020   Procedure: ESOPHAGOGASTRODUODENOSCOPY (EGD) WITH PROPOFOL ;  Surgeon: Therisa Bi, MD;  Location: [facility] ENDOSCOPY;  Service: Gastroenterology;  Laterality: N/A;   ESOPHAGOGASTRODUODENOSCOPY (EGD) WITH PROPOFOL  N/A 07/27/2023   Procedure: ESOPHAGOGASTRODUODENOSCOPY (EGD) WITH PROPOFOL ;  Surgeon: Therisa Bi, MD;  Location: [facility] ENDOSCOPY;  Service: Gastroenterology;  Laterality: N/A;   FOOT SURGERY Bilateral 03/18/2020   FOOT SURGERY  08/032021   GALLBLADDER SURGERY  09/15/2015   Gallbladder Removal. Procedure done by Dr.Beverly. Per New Patient Packet   KIDNEY STONE SURGERY  09/14/1980   Too many to count. Per New Patient Packet 09/14/1980-09/15/1995   KIDNEY STONE SURGERY  09/15/1995   Too many to count. Per New  Patient Packet   LEFT HEART CATH AND CORONARY ANGIOGRAPHY N/A 11/27/2021   Procedure: LEFT HEART CATH AND CORONARY ANGIOGRAPHY;  Surgeon: Anner Alm ORN, MD;  Location: [facility] CATH:  NSTEMI - 2 V CAD: 100% thrombotic prox-mid LCx (DES PCI), ost OM1 65%; Ost LAD 75% - prox LAD 90%, prox-mid 99% (DES PCI).  MIld diffuse RCA disease - calcicifed.  R-L collaterals filling LCx. EF ~45-50% with lateral HK. LVEDP 28 mmHg   LITHOTRIPSY  09/14/2014   Per New Patient Packet   LITHOTRIPSY  09/14/1996   No Stints Used. Per New Patient Packet   LITHOTRIPSY  09/14/1997   No Stints used. Per New Patient Packet   PAIN PUMP IMPLANTATION     PAIN PUMP REMOVAL     SHOULDER SURGERY Right 1984   SIGMOIDOSCOPY  09/14/2017   Per New Patient Packet   SPINAL CORD STIMULATOR IMPLANT     SPINE SURGERY  1999, 2000   Fusion, Inst. Fusion   TONSILLECTOMY     TRANSTHORACIC ECHOCARDIOGRAM  11/28/2021   (in setting of non-STEMI): EF 50 to 55%.  Suspect inferior and lateral  hypokinesis.  Indeterminate diastolic parameters.  Normal RV size and function.  Normal RAP.  Normal aortic and mitral valves with only mild MR and AI.  Normal RVP and RAP.   UPPER GI ENDOSCOPY     VIDEO BRONCHOSCOPY WITH ENDOBRONCHIAL ULTRASOUND N/A 01/15/2023   Procedure: VIDEO BRONCHOSCOPY WITH ENDOBRONCHIAL ULTRASOUND;  Surgeon: Parris Manna, MD;  Location: [facility] ORS;  Service: Thoracic;  Laterality: N/A;    Family History: Family History  Problem Relation Age of Onset   Heart disease Father    Heart failure Father    Hypertension Father    Stroke Father    Vision loss Father    Stroke Mother    Dementia Mother    Diabetes Mother    Kidney Stones Daughter    Anxiety disorder Daughter    OCD Daughter     Social History: Social History   Tobacco Use  Smoking Status Never   Passive exposure: Never  Smokeless Tobacco Never   Social History   Substance and Sexual Activity  Alcohol Use Yes   Comment: 1 Drink a Month, socially   Social History   Substance and Sexual Activity  Drug Use Not Currently    Allergies: Allergies  Allergen Reactions   Ace Inhibitors     Other reaction(s): Cough   Fluoxetine Anxiety    made me fall asleep per pt bad headaches and makes  Me  Crazy historical allergy noted in [employer] made me fall asleep per pt bad headaches and makes  Me  Crazy Per New Patient Packet.     Metoclopramide     Other reaction(s): Other (See Comments), Other (See Comments), Unknown Tardive Dyskinesia  historical allergy noted in [employer] Tardive Dyskinesia  Per New Patient Packet.   Nalbuphine     Used Post Back surgery- Anesthesiologist Error. Patient had Narcotic Withdraw. Per New Patient Packet.    Other     Other reaction(s): Other (See Comments) Altered mental status in combo with narcotics at previous hospitalization - Full Withdrawal Symptoms Other reaction(s): Rash   Amoxicillin-Pot Clavulanate Nausea Only    Per New Patient  Packet.   Doxazosin Rash    Other reaction(s): Other - See Comments, Rash UNKNOWN REACTION UNKNOWN REACTION    Duloxetine Nausea Only    Per New Patient Packet.    Penicillins Nausea Only    Per  New Patient Packet.   Tamsulosin Itching and Anxiety    Restless, Flushing, Heavy Chest, Itching, Hyperactive mood and Anxiety. Unable to handle side effects. Per New Patient Packet.     Trazodone  And Nefazodone Itching, Anxiety and Rash    Headache. INCREASED MY ANXIETY AND HEARTRATE Flushing, tachycardia INCREASED MY ANXIETY AND HEARTRATE Per New Patient Packet.    Alirocumab  Other (See Comments)   Amlodipine     Shaking, unsure of reaction. Per New Patient Packet.     Cinoxacin     GI Intolerance, and Dizziness. Per New Patient Packet.    Ciprofloxacin     Other reaction(s): Unknown   Fludrocortisone  Other (See Comments)    Worsening headaches, GI issues, fatigue   Nebivolol     Other reaction(s): Unknown   Olanzapine     Headache and unable to sleep for 3 nights. Per New Patient Packet.    Olmesartan     Other reaction(s): Unknown   Pregabalin     Confusion, Lack of concentration, dizziness, and likely drowsiness. Per New Patient Packet.     Prostaglandins     Other reaction(s): Other (See Comments) Intolerance   Thyroid  Hormones     Other reaction(s): Other (See Comments) Thyroid  (Nature Thyroid ) contraindicated with some of your other medications.   Venlafaxine  Other (See Comments)    High blood pressure- hospitalized   venlafaxine    Venlafaxine  Hcl Er Hypertension   Zolpidem     Nightmares, Ineffective after 2 days. Per New Patient Packet.    Duloxetine Hcl     Other reaction(s): Rash   Fluoxetine Hcl     Other reaction(s): Rash   Nucynta  [Tapentadol ] Other (See Comments)    Vertigo    Phenytoin Anxiety    Hyperactivity, and Ineffective. Per New Patient Packet.     Medications: Current Outpatient Medications  Medication Sig Dispense Refill    anastrozole  (ARIMIDEX ) 1 MG tablet 1/2 tablet (0.5 mg) by mouth once weekly     aspirin  EC 81 MG tablet Take 1 tablet (81 mg total) by mouth daily. Swallow whole.     Cholecalciferol  50 MCG (2000 UT) CAPS Take 1 tablet by mouth daily.     cholestyramine  (QUESTRAN ) 4 g packet as needed (diarrea).     Cyanocobalamin  (VITAMIN B-12) 5000 MCG LOZG Take 1 lozenge by mouth daily.      escitalopram  (LEXAPRO ) 20 MG tablet TAKE 1 TABLET(20 MG) BY MOUTH DAILY 90 tablet 1   ezetimibe -simvastatin  (VYTORIN ) 10-20 MG tablet Take 1 tablet by mouth daily. 90 tablet 3   hydrALAZINE  (APRESOLINE ) 25 MG tablet Take 2 tabs p.o. in a.m. and q. afternoon, 1 tab p.o. in the evening 450 tablet 1   Hyoscyamine Sulfate SL 0.125 MG SUBL TAKE 1 TABLET UNDER TONGUE TWICE DAILY OR AS DIRECTED     ipratropium (ATROVENT ) 0.03 % nasal spray Place 2 sprays into both nostrils every 12 (twelve) hours. 30 mL 12   ketoconazole (NIZORAL) 2 % shampoo Apply 1 Application topically once.     linaclotide  (LINZESS ) 72 MCG capsule Take 1 capsule (72 mcg total) by mouth daily before breakfast.     Loperamide  HCl (IMODIUM  PO) Take by mouth as needed.      MAGNESIUM  BISGLYCINATE PO Take by mouth daily.     meclizine  (ANTIVERT ) 25 MG tablet Take 1 tablet (25 mg total) by mouth 3 (three) times daily as needed for dizziness. 30 tablet 0   melatonin 5 MG TABS Take 5 mg by mouth  at bedtime.     MIEBO 1.338 GM/ML SOLN Place 1 drop into both eyes 4 (four) times daily.     NONFORMULARY OR COMPOUNDED ITEM Bi-Mix Papaverine 30mg , Phentolamine 1mg    Dosage: Inject 0.25cc-0.5cc as need for ED   Vial 1ml   Qty #5 Refills 6   [facility] [phone] Fax [phone] 5 each 6   Nutritional Supplements (NUTRITIONAL SUPPLEMENT PO) Take 1 capsule by mouth daily. [name]     PREVIDENT 5000 DRY MOUTH 1.1 % GEL dental gel SMARTSIG:To Teeth 3 Times Daily     Probiotic Product (PROBIOTIC DAILY PO) Take by mouth daily.      propranolol  (INDERAL ) 40 MG tablet Take 40 mg by mouth 2 (two) times daily.     RABEprazole  (ACIPHEX ) 20 MG tablet Take 2 tablets (40 mg total) by mouth 2 (two) times daily. 360 tablet 3   testosterone  cypionate (DEPOTESTOSTERONE CYPIONATE) 200 MG/ML injection Inject 80 mg into the muscle every 14 (fourteen) days.     traZODone  (DESYREL ) 50 MG tablet Take 0.5-1 tablets (25-50 mg total) by mouth at bedtime. 90 tablet 0   triamcinolone cream (KENALOG) 0.1 % as needed (skin irritation, insect bites).     valACYclovir  (VALTREX ) 1000 MG tablet Take 2 tabs p.o. and repeat in 12 hours as needed for cold sore 30 tablet 0   DHEA 25 MG CAPS Take 25 mg by mouth daily.     Vibegron  (GEMTESA ) 75 MG TABS Take 1 tablet (75 mg total) by mouth daily. 30 tablet 11   No current facility-administered medications for this visit.    Review of Systems: GENERAL: negative for malaise, night sweats HEENT: No changes in hearing or vision, no nose bleeds or other nasal problems. NECK: Negative for lumps, goiter, pain and significant neck swelling RESPIRATORY: Negative for cough, wheezing CARDIOVASCULAR: Negative for chest pain, leg swelling, palpitations, orthopnea GI: SEE HPI MUSCULOSKELETAL: Negative for joint pain or swelling, back pain, and muscle pain. SKIN: Negative for lesions, rash HEMATOLOGY Negative for prolonged bleeding, bruising easily, and swollen nodes. ENDOCRINE: Negative for cold or heat intolerance, polyuria, polydipsia and goiter. NEURO: negative for tremor, gait imbalance, syncope and seizures. The remainder of the review of systems is noncontributory.   Physical Exam: BP (!) 163/72 (BP Location: Left Leg, Patient Position: Sitting, Cuff Size: Normal)   Pulse 61   Temp 97.8 F (36.6 C) (Temporal)   Ht 5' 9 (1.753 m)   Wt 146 lb 6.4 oz (66.4 kg)   BMI 21.62 kg/m  GENERAL: The patient is AO x3, in no acute distress. HEENT: Head is normocephalic and atraumatic. EOMI are intact. Mouth is  well hydrated and without lesions. NECK: Supple. No masses LUNGS: Clear to auscultation. No presence of rhonchi/wheezing/rales. Adequate chest expansion HEART: RRR, normal s1 and s2. ABDOMEN: Soft, diffuse lower abdomen tenderness, no guarding, no peritoneal signs, and nondistended. BS +. No masses.negative Carnet sign     Imaging/Labs: as above     Latest Ref Rng & Units 05/30/2024    6:29 PM 04/28/2024    4:09 PM 07/06/2023   11:54 AM  CBC  WBC 4.0 - 10.5 K/uL 6.8  6.4  6.2   Hemoglobin 13.0 - 17.0 g/dL 84.3  85.1  85.1   Hematocrit 39.0 - 52.0 % 47.8  46.8  46.5   Platelets 150 - 400 K/uL 198  255  237    Lab Results  Component Value Date   IRON 98 09/16/2020  TIBC 250 09/16/2020   FERRITIN 50 07/06/2023    I personally reviewed and interpreted the available labs, imaging and endoscopic files.  Ct abdomen and pelvis 05/2024  IMPRESSION: 1. Few fluid-filled loops of small bowel in the pelvis without wall thickening or inflammation, can be seen with enteritis. 2. Nonobstructing bilateral renal calculi. 3. Colonic diverticulosis without diverticulitis. 4. Nodular opacities in the lung bases have diminished in size or are stable from prior exam, previously assessed by chest CT and favoring chronic indolent infection.    IMPRESSION: No acute intracranial abnormality.   Impression and Plan:  Grant Young is a 78 y.o. male with coronary artery disease status post PCI on aspirin , chronic back pain/neuropathy, ascending aortic aneurysm follows with pain management who presents for evaluation of chronic progressive abdominal pain  #Chronic progressive abdominal pain  #Altered bowel movements #Iron deficiency without anemia  Patient has new onset of iron deficiency without anemia with ferritin : 23 (03/2024)  Patient has chronically progressive abdominal pain with some relationship with food intake, indicates pain may be related to GI pathology.   Although patient has  chronic musculoskeletal pain and this may be functional in nature  Patient had upper endoscopy performed last year with small gastric ulcers and hence we may be dealing with peptic ulcer disease.  Previously had good response to PPI  -Patient has significant vascular disease (CAD /AA) and chronic mesenteric ischemia is another possibility.  Although recent CT abdomen pelvis IV contrast was negative I discussed obtaining protocol CT angio abdomen to assess abdominal vasculature to ensure we are not dealing with chronic mesenteric ischemia, MALS (Median arcuate ligament syndrome) or SMA ( superior mesenteric artery)  -Will plan on repeat upper endoscopy to evaluate for any worsening peptic ulcer disease given new onset iron deficiency and colonoscopy as last prep was poor and suggested repeat was 3 months anyways  ,from 01/2024  -[facility]  Linzess  samples given again today patient has good data in managing visceral pain,  -If all of the workup is negative then we may treat this as abdominal wall pain syndrome/ACNES and patient may benefit from transversus abdominis plane (TAP) block   All questions were answered.      Diana Armijo Faizan Katina Remick, MD Gastroenterology and Hepatology [facility] Gastroenterology   This chart has been completed using [facility] Dictation software, and while attempts have been made to ensure accuracy , certain words and phrases may not be transcribed as intended

## 2024-06-14 NOTE — Patient Instructions (Addendum)
 It was very nice to meet you today, as dicussed with will plan for the following :  1) Upper endoscopy and colonoscopy   2) CT angio abdomen - at [place]  3) Iberogast

## 2024-06-15 ENCOUNTER — Telehealth (INDEPENDENT_AMBULATORY_CARE_PROVIDER_SITE_OTHER): Payer: Self-pay

## 2024-06-15 MED ORDER — PEG 3350-KCL-NA BICARB-NACL 420 G PO SOLR: 4000.0000 mL | Freq: Once | ORAL | 0 refills | Status: AC

## 2024-06-15 NOTE — Telephone Encounter (Signed)
 Spoke with patient, scheduled TCS/EGD for 07/18/2024 at 9:00 am. Rx sent to pharmacy. Instructions sent to mychart. Also informed patient of CT appt at [facility], patient stated he may have to call and reschedule that, I gave him the number to call.

## 2024-06-18 ENCOUNTER — Emergency Department
Admission: EM | Admit: 2024-06-18 | Discharge: 2024-06-18 | Disposition: A | Discharge status: Home / Self Care | Attending: Emergency Medicine | Admitting: Emergency Medicine

## 2024-06-18 ENCOUNTER — Emergency Department

## 2024-06-18 ENCOUNTER — Other Ambulatory Visit: Payer: Self-pay

## 2024-06-18 DIAGNOSIS — N200 Calculus of kidney: Secondary | ICD-10-CM

## 2024-06-18 DIAGNOSIS — N202 Calculus of kidney with calculus of ureter: Secondary | ICD-10-CM | POA: Insufficient documentation

## 2024-06-18 DIAGNOSIS — R10A2 Flank pain, left side: Secondary | ICD-10-CM

## 2024-06-18 DIAGNOSIS — R109 Unspecified abdominal pain: Secondary | ICD-10-CM | POA: Diagnosis present

## 2024-06-18 LAB — COMPREHENSIVE METABOLIC PANEL WITH GFR
ALT: 19 U/L (ref 0–44)
AST: 23 U/L (ref 15–41)
Albumin: 4 g/dL (ref 3.5–5.0)
Alkaline Phosphatase: 74 U/L (ref 38–126)
Anion gap: 13 (ref 5–15)
BUN: 14 mg/dL (ref 8–23)
CO2: 24 mmol/L (ref 22–32)
Calcium: 9.4 mg/dL (ref 8.9–10.3)
Chloride: 106 mmol/L (ref 98–111)
Creatinine, Ser: 0.77 mg/dL (ref 0.61–1.24)
GFR, Estimated: >60 mL/min (ref >60)
Glucose, Bld: 111 mg/dL — ABNORMAL HIGH (ref 70–99)
Potassium: 3.9 mmol/L (ref 3.5–5.1)
Sodium: 143 mmol/L (ref 135–145)
Total Bilirubin: 0.3 mg/dL (ref 0.0–1.2)
Total Protein: 7.1 g/dL (ref 6.5–8.1)

## 2024-06-18 LAB — CBC WITH DIFFERENTIAL/PLATELET
Abs Immature Granulocytes: 0.02 K/uL (ref 0.00–0.07)
Basophils Absolute: 0.1 K/uL (ref 0.0–0.1)
Basophils Relative: 1 %
Eosinophils Absolute: 0.1 K/uL (ref 0.0–0.5)
Eosinophils Relative: 1 %
HCT: 47.3 % (ref 39.0–52.0)
Hemoglobin: 15.3 g/dL (ref 13.0–17.0)
Immature Granulocytes: 0 %
Lymphocytes Relative: 20 %
Lymphs Abs: 1.5 K/uL (ref 0.7–4.0)
MCH: 29.1 pg (ref 26.0–34.0)
MCHC: 32.3 g/dL (ref 30.0–36.0)
MCV: 90.1 fL (ref 80.0–100.0)
Monocytes Absolute: 0.7 K/uL (ref 0.1–1.0)
Monocytes Relative: 9 %
Neutro Abs: 5.3 K/uL (ref 1.7–7.7)
Neutrophils Relative %: 69 %
Platelets: 202 K/uL (ref 150–400)
RBC: 5.25 MIL/uL (ref 4.22–5.81)
RDW: 13.2 % (ref 11.5–15.5)
WBC: 7.6 K/uL (ref 4.0–10.5)
nRBC: 0 % (ref 0.0–0.2)

## 2024-06-18 LAB — URINALYSIS, W/ REFLEX TO CULTURE (INFECTION SUSPECTED)
Bacteria, UA: NONE SEEN
Bilirubin Urine: NEGATIVE
Glucose, UA: NEGATIVE mg/dL
Ketones, ur: NEGATIVE mg/dL
Leukocytes,Ua: NEGATIVE
Nitrite: NEGATIVE
Protein, ur: NEGATIVE mg/dL
RBC / HPF: >50 RBC/hpf (ref 0–5)
Specific Gravity, Urine: 1.009 (ref 1.005–1.030)
Squamous Epithelial / HPF: 0 /HPF (ref 0–5)
pH: 7 (ref 5.0–8.0)

## 2024-06-18 LAB — LACTIC ACID, PLASMA: Lactic Acid, Venous: 0.8 mmol/L (ref 0.5–1.9)

## 2024-06-18 LAB — LIPASE, BLOOD: Lipase: 37 U/L (ref 11–51)

## 2024-06-18 MED ORDER — TRAMADOL HCL 50 MG PO TABS: 50.0000 mg | ORAL_TABLET | Freq: Four times a day (QID) | ORAL | 0 refills | Status: DC | PRN

## 2024-06-18 MED ORDER — LACTATED RINGERS IV BOLUS
1000.0000 mL | Freq: Once | INTRAVENOUS | Status: AC
  Administered 2024-06-18: 1000 mL via INTRAVENOUS

## 2024-06-18 MED ORDER — KETOROLAC TROMETHAMINE 10 MG PO TABS: 10.0000 mg | ORAL_TABLET | Freq: Four times a day (QID) | ORAL | 0 refills | Status: DC | PRN

## 2024-06-18 MED ORDER — KETOROLAC TROMETHAMINE 30 MG/ML IJ SOLN
15.0000 mg | Freq: Once | INTRAMUSCULAR | Status: AC
  Administered 2024-06-18: 15 mg via INTRAVENOUS
  Filled 2024-06-18: qty 1

## 2024-06-18 MED ORDER — ONDANSETRON 4 MG PO TBDP: 4.0000 mg | ORAL_TABLET | Freq: Three times a day (TID) | ORAL | 0 refills | Status: AC | PRN

## 2024-06-18 NOTE — ED Triage Notes (Signed)
 BIBEMS from [facility]. C/O of L sided abdominal pain that radiates to back, started @830am  today and has worsened throughout the day. No problem urinating. Hx of kidney stones. Pt states they currently have 1 kidney stone in each kidney. 198/89, otherwise VSS.

## 2024-06-18 NOTE — ED Provider Notes (Signed)
 [facility] Provider Note   Event Date/Time   First MD Initiated Contact with Patient 06/18/24 1300     (approximate) History  Abdominal Pain  HPI Grant Young is a 78 y.o. male with a stated past medical history of kidney stones who presents complaining of left-sided flank pain that radiates into the groin as well as into the back that began earlier today.  Patient describes this is a 10/10, waxing and waning pain with no exacerbating or relieving factors.  Patient endorses mild associated nausea. ROS: Patient currently denies any vision changes, tinnitus, difficulty speaking, facial droop, sore throat, chest pain, shortness of breath, vomiting/diarrhea, dysuria, or weakness/numbness/paresthesias in any extremity   Physical Exam  Triage Vital Signs: ED Triage Vitals  Encounter Vitals Group     BP 06/18/24 1308 (!) 201/93     Girls Systolic BP Percentile --      Girls Diastolic BP Percentile --      Boys Systolic BP Percentile --      Boys Diastolic BP Percentile --      Pulse Rate 06/18/24 1308 63     Resp 06/18/24 1308 17     Temp 06/18/24 1308 99.2 F (37.3 C)     Temp Source 06/18/24 1308 Oral     SpO2 06/18/24 1308 100 %     Weight 06/18/24 1303 142 lb (64.4 kg)     Height 06/18/24 1303 5' 9 (1.753 m)     Head Circumference --      Peak Flow --      Pain Score 06/18/24 1303 7     Pain Loc --      Pain Education --      Exclude from Growth Chart --    Most recent vital signs: Vitals:   06/18/24 1308 06/18/24 1315  BP: (!) 201/93 (!) 192/86  Pulse: 63 61  Resp: 17 12  Temp: 99.2 F (37.3 C)   SpO2: 100% 100%   General: Awake, oriented x4. CV:  Good peripheral perfusion. Resp:  Normal effort. Abd:  No distention. Other:  Elderly well-developed, well-nourished Caucasian male resting comfortably in no acute distress ED Results / Procedures / Treatments  Labs (all labs ordered are listed, but only abnormal results are displayed) Labs  Reviewed  COMPREHENSIVE METABOLIC PANEL WITH GFR - Abnormal; Notable for the following components:      Result Value   Glucose, Bld 111 (*)    All other components within normal limits  URINALYSIS, W/ REFLEX TO CULTURE (INFECTION SUSPECTED) - Abnormal; Notable for the following components:   Color, Urine YELLOW (*)    APPearance CLEAR (*)    Hgb urine dipstick LARGE (*)    All other components within normal limits  CBC WITH DIFFERENTIAL/PLATELET  LACTIC ACID, PLASMA  LIPASE, BLOOD  LACTIC ACID, PLASMA   EKG ED ECG REPORT I, Artist MARLA Kerns, the attending physician, personally viewed and interpreted this ECG. Date: 06/18/2024 EKG Time: 1315 Rate: 60 Rhythm: normal sinus rhythm QRS Axis: normal Intervals: normal ST/T Wave abnormalities: normal Narrative Interpretation: no evidence of acute ischemia RADIOLOGY ED MD interpretation: CT renal stone study shows a 3 x 3 calculus in the proximal left ureter with mild obstructive uropathy - All radiology independently interpreted and agree with radiology assessment Official radiology report(s): CT Renal Stone Study Result Date: 06/18/2024 CLINICAL DATA:  Abdominal/flank pain, stone suspected. Left-sided abdominal pain radiating to back. EXAM: CT ABDOMEN AND PELVIS WITHOUT CONTRAST TECHNIQUE: Multidetector  CT imaging of the abdomen and pelvis was performed following the standard protocol without IV contrast. RADIATION DOSE REDUCTION: This exam was performed according to the departmental dose-optimization program which includes automated exposure control, adjustment of the mA and/or kV according to patient size and/or use of iterative reconstruction technique. COMPARISON:  05/30/2024. FINDINGS: Lower chest: Coronary artery calcification is noted. Atelectasis or scarring is present at the lung bases. There are stable nodular densities at the lung bases. Hepatobiliary: No focal liver abnormality is seen. Status post cholecystectomy. No biliary  dilatation. Pancreas: Unremarkable. No pancreatic ductal dilatation or surrounding inflammatory changes. Spleen: Normal in size without focal abnormality. Adrenals/Urinary Tract: The adrenal glands are within normal limits. Renal calculi are present bilaterally. There is no hydroureteronephrosis on the right. There is a 3 x 3 mm calculus in the proximal left ureter with mild obstructive uropathy. The bladder is within normal limits. Stomach/Bowel: The stomach is within normal limits. No bowel obstruction, free air, or pneumatosis is seen. The appendix is not seen. Vascular/Lymphatic: Aortic atherosclerosis. No enlarged abdominal or pelvic lymph nodes. Reproductive: Prostate is unremarkable. Other: No abdominopelvic ascites. A nerve stimulator device is present in the left flank with leads terminating in the thoracic spinal canal. Musculoskeletal: Degenerative changes are present in the thoracolumbar spine. Spinal fusion hardware is present from L5-S1. No acute osseous abnormality is seen. IMPRESSION: 1. Mild obstructive uropathy on the left with a 3 x 3 mm calculus in the proximal left ureter. 2. Bilateral nephrolithiasis. 3. Nodular opacities at the lung bases, not significantly changed from the prior exam, which may be infectious or inflammatory as compared with prior exams. 4. Coronary artery calcification. 5. Aortic atherosclerosis. Electronically Signed   By: Leita Birmingham M.D.   On: 06/18/2024 14:27   PROCEDURES: Critical Care performed: No Procedures MEDICATIONS ORDERED IN ED: Medications  ketorolac (TORADOL) 30 MG/ML injection 15 mg (15 mg Intravenous Given 06/18/24 1402)  lactated ringers  bolus 1,000 mL (1,000 mLs Intravenous New Bag/Given 06/18/24 1405)   IMPRESSION / MDM / ASSESSMENT AND PLAN / ED COURSE  I reviewed the triage vital signs and the nursing notes.                             The patient is on the cardiac monitor to evaluate for evidence of arrhythmia and/or significant heart rate  changes. Patient's presentation is most consistent with acute presentation with potential threat to life or bodily function. Patient presents for severe flank pain. Presentation most consistent with Renal Colic from a Non-infected Kidney Stone. Given History and Exam I have lower suspicion for atypical appendicitis, genital torsion, acute cholecystitis, AAA, Aortic Dissection, Serious Bacterial Illness or other emergent intraabdominal pathology.  Workup: CBC, BMP, CT Abd/Pelvis noncontrast, UA, reassess Findings: 3 mm proximal left ureteral stone Reassesment: Patient tolerating PO and pain controlled Disposition:  Discharge. Strict return precautions for infected stone or PO intolerance discussed.   FINAL CLINICAL IMPRESSION(S) / ED DIAGNOSES   Final diagnoses:  Left flank pain  Kidney stone on left side   Rx / DC Orders   ED Discharge Orders          Ordered    ketorolac (TORADOL) 10 MG tablet  Every 6 hours PRN       Note to Pharmacy: Patient given an IM/IV loading dose in emergency department   06/18/24 1458    traMADol (ULTRAM) 50 MG tablet  Every 6 hours PRN  06/18/24 1458    ondansetron  (ZOFRAN -ODT) 4 MG disintegrating tablet  Every 8 hours PRN        06/18/24 1458           Note:  This document was prepared using Dragon voice recognition software and may include unintentional dictation errors.   Titianna Loomis K, MD 06/18/24 [phone]

## 2024-06-19 ENCOUNTER — Telehealth (INDEPENDENT_AMBULATORY_CARE_PROVIDER_SITE_OTHER): Payer: Self-pay

## 2024-06-19 DIAGNOSIS — K581 Irritable bowel syndrome with constipation: Secondary | ICD-10-CM

## 2024-06-19 NOTE — Progress Notes (Unsigned)
 Cardiology Office Note    Date:  06/20/2024   ID:  Alandis, Bluemel [birth year], MRN [id number]  PCP:  Antonette Angeline LELON, NP  Cardiologist:  Alm Clay, MD  Electrophysiologist:  None   Chief Complaint: Follow-up  History of Present Illness:   DOVID BARTKO is a 78 y.o. male with history of CAD with MI x 2 in 1990 & 1999 s/p PTCA and with NSTEMI in 11/2021 status post PCI/DES to the LAD and LCx, HFpEF, labile HTN, HLD, headaches, GERD, multiple medication intolerances who presents for follow-up of his CAD.    He was admitted to the hospital in 11/2021 with a high risk NSTEMI and underwent PCI to the LAD and LCx.  Mildly reduced LVEF with lateral hypokinesis was noted on LV gram.  Echo demonstrated an EF of 50 to 55%, select images concerning for inferior and lateral wall hypokinesis, indeterminate LV diastolic function parameters, normal RV systolic function and ventricular cavity size, normal PASP, mild mitral valve regurgitation, aorta documented as normal in size and structure, and an estimated right atrial pressure of 3 mmHg.  Following admission, he noted significant fatigue and wondered if this was related to his statin therapy.  He was subsequently evaluated by the lipid clinic and has been initiated on Repatha .   He was evaluated by pulmonology in the outpatient setting for a persistent cough in the fall 2023, being treated for right lower lobe pneumonia.  CT was performed in 05/2022 with multilobar consolidation with hypermetabolism.  PET scan in 05/2022 showed interval development of multilobar airspace consolidation with associated hypermetabolism.  He was subsequently admitted in 08/2022 with multifocal pneumonia.  He has had chronic abdominal pain that has persisted despite interruption of PCSK9 inhibitor.  In the setting of chronic back pain, Repatha  was subsequently transitioned to Praluent  in 03/2023.  He is reported chronic fatigue, dizziness, and exercise intolerance leading to  de-escalation of pharmacotherapy at prior visits.  In 04/2023 he self discontinued Praluent  in the setting of frequent headaches.  He has undergone multiple medication adjustments in the setting of labile hypertension.      He was last seen in the office in 12/2023 and reported he was undergoing Parkinson's testing through [facility] neurology.  He was started on Sinemet with slow titration and was taken off propranolol  by outside office.  He was scheduled for right cubital tunnel release 06/09/2024 and was evaluated by our preoperative team with no further testing required.  However, patient had to cancel the surgery due to abdominal pain.  Today patient presents overall doing well from a cardiac perspective.  He was evaluated in the emergency department 10/5 with flank pain and found to have kidney stones for which he was prescribed ketorolac, ondansetron , and tramadol. He has been staying well hydrated but still has significant intermittent flank pain. Cardiac testing was overall reassuring with negative EKG and troponins. He reports ongoing abdominal pain for which he is being evaluated by GI.  He has upcoming endoscopy and colonoscopy scheduled for early November.  He keeps a blood pressure log with labile readings ranging from 100-150 systolic.  He attributes higher readings to increased pain.  He denies chest pain, shortness of breath, palpitations, lightheadedness, dizziness, and lower extremity swelling.  No bleeding or hematochezia.  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   Kidney Stones   Clotting disorder 01-2022   Plavix    Coronary artery disease    a.) s/p PCI with DES x 2 (pLCx and o-mLAD) 11/27/2021   Depression    Erectile dysfunction    a.) Bi-mix (papaverine + phentolamine) injections + exogenous testosterone  injections   GERD (gastroesophageal reflux disease)    h/o Lyme disease    Headache    Hiatal hernia    History of bilateral cataract extraction 01/2021   History of gastritis    History of kidney stones    History of neuropathy    Hyperlipidemia LDL goal <70    a.) CAD with NSTEMI -->  intolerant of statins with statin myopathy and memory issues.;  Also intolerant of Repatha    Hypertension    Labile blood pressures but dizziness with blood pressures in the normal range; intolerant of most medications including ARB's, ACE inhibitor's, amlodipine most beta-blockers other than propranolol .   Insomnia    a.) takes malatonin PRN   Left lower lobe pulmonary nodule    a.) chest CT 12/07/2022: nodules x 2 posteromedial LLL;  8 x 11 mm and 8 x 10 mm   Long term current use of antithrombotics/antiplatelets    a.) DAPT (ASA + clopidogrel )   Long term current use of aromatase inhibitor    a,) anastrozole  --> estridiol suppression secondary to exogenous testosterone  use   Lumbar radicular pain    Myocardial infarction (HCC)    a.) MI x 2 - 1990 * 1999 - PTCA   NSTEMI (non-ST elevated myocardial infarction) (HCC) 11/27/2021   a.) LHC 11/28/2021: sequential 75% o-pLAD, 70% OM2, 80% p-mLAD, 99% mLAD, 100% pLCx --> PCI placing a 2.5 x 26 mm Onyx Frontier DES to pLCx and a 2.5 x 24 mm Onyx Frontier DES to the o-m LAD (covering 3 lesions)   Overactive bladder    Pneumonia    PONV (postoperative nausea and vomiting)    Recurrent herpes labialis    a.) has suppressive valacyclovir  to use PRN   Skin cancer, basal cell    Spinal cord stimulator status    01/08/21 - not currently using.   Squamous  cell skin cancer    Substance abuse (HCC)    Syncope and collapse 2022    Past Surgical History:  Procedure Laterality Date   BACK SURGERY     BASAL CELL CARCINOMA EXCISION     BIOPSY  07/27/2023   Procedure: BIOPSY;  Surgeon: Therisa Bi, MD;  Location: [facility] ENDOSCOPY;  Service: Gastroenterology;;   CATARACT EXTRACTION W/PHACO Left 01/14/2021   Procedure: CATARACT EXTRACTION PHACO AND INTRAOCULAR LENS PLACEMENT (IOC) LEFT VIVITY TORIC LENS 8.75 00:56.7;  Surgeon: Jaye Fallow, MD;  Location: [facility];  Service: Ophthalmology;  Laterality: Left;   CATARACT EXTRACTION W/PHACO Right 01/28/2021   Procedure: CATARACT EXTRACTION PHACO AND INTRAOCULAR LENS PLACEMENT (IOC) RIGHT VIVITY TORIC LENS;  Surgeon: Jaye Fallow, MD;  Location: [facility];  Service: Ophthalmology;  Laterality: Right;  6.54 00:46.4   CHOLECYSTECTOMY  2017   COLONOSCOPY  09/15/2015  Per New Patient Packet   COLONOSCOPY N/A 01/26/2024   Procedure: COLONOSCOPY;  Surgeon: Therisa Bi, MD;  Location: [facility] ENDOSCOPY;  Service: Gastroenterology;  Laterality: N/A;   COLONOSCOPY WITH PROPOFOL  N/A 10/03/2020   Procedure: COLONOSCOPY WITH PROPOFOL ;  Surgeon: Therisa Bi, MD;  Location: [facility] ENDOSCOPY;  Service: Gastroenterology;  Laterality: N/A;   CORONARY ANGIOPLASTY  1998, 2023   CORONARY/GRAFT ACUTE MI REVASCULARIZATION N/A 11/27/2021   Procedure: Coronary/Graft Acute MI Revascularization;  Surgeon: Anner Alm ORN, MD;  Location: [facility] CATH: (NSTEMI) - > 100% prox-mid LCx (Onyx Frontier DES 2.5 x 26 -> 2.7 mm, Ost  OM1 65%.  Ost-mid LAD 3 lesions 75%, 90%, 99% => DES PCI Onyx Frontier DES 2.5 x 34 -> 2.8 mm   ESOPHAGOGASTRODUODENOSCOPY (EGD) WITH PROPOFOL  N/A 10/03/2020   Procedure: ESOPHAGOGASTRODUODENOSCOPY (EGD) WITH PROPOFOL ;  Surgeon: Therisa Bi, MD;  Location: [facility] ENDOSCOPY;  Service: Gastroenterology;  Laterality: N/A;   ESOPHAGOGASTRODUODENOSCOPY (EGD) WITH PROPOFOL  N/A 07/27/2023    Procedure: ESOPHAGOGASTRODUODENOSCOPY (EGD) WITH PROPOFOL ;  Surgeon: Therisa Bi, MD;  Location: [facility] ENDOSCOPY;  Service: Gastroenterology;  Laterality: N/A;   FOOT SURGERY Bilateral 03/18/2020   FOOT SURGERY  08/032021   GALLBLADDER SURGERY  09/15/2015   Gallbladder Removal. Procedure done by Dr.Beverly. Per New Patient Packet   KIDNEY STONE SURGERY  09/14/1980   Too many to count. Per New Patient Packet 09/14/1980-09/15/1995   KIDNEY STONE SURGERY  09/15/1995   Too many to count. Per New Patient Packet   LEFT HEART CATH AND CORONARY ANGIOGRAPHY N/A 11/27/2021   Procedure: LEFT HEART CATH AND CORONARY ANGIOGRAPHY;  Surgeon: Anner Alm ORN, MD;  Location: [facility] CATH:  NSTEMI - 2 V CAD: 100% thrombotic prox-mid LCx (DES PCI), ost OM1 65%; Ost LAD 75% - prox LAD 90%, prox-mid 99% (DES PCI).  MIld diffuse RCA disease - calcicifed.  R-L collaterals filling LCx. EF ~45-50% with lateral HK. LVEDP 28 mmHg   LITHOTRIPSY  09/14/2014   Per New Patient Packet   LITHOTRIPSY  09/14/1996   No Stints Used. Per New Patient Packet   LITHOTRIPSY  09/14/1997   No Stints used. Per New Patient Packet   PAIN PUMP IMPLANTATION     PAIN PUMP REMOVAL     SHOULDER SURGERY Right 1984   SIGMOIDOSCOPY  09/14/2017   Per New Patient Packet   SPINAL CORD STIMULATOR IMPLANT     SPINE SURGERY  1999, 2000   Fusion, Inst. Fusion   TONSILLECTOMY     TRANSTHORACIC ECHOCARDIOGRAM  11/28/2021   (in setting of non-STEMI): EF 50 to 55%.  Suspect inferior and lateral hypokinesis.  Indeterminate diastolic parameters.  Normal RV size and function.  Normal RAP.  Normal aortic and mitral valves with only mild MR and AI.  Normal RVP and RAP.   UPPER GI ENDOSCOPY     VIDEO BRONCHOSCOPY WITH ENDOBRONCHIAL ULTRASOUND N/A 01/15/2023   Procedure: VIDEO BRONCHOSCOPY WITH ENDOBRONCHIAL ULTRASOUND;  Surgeon: Parris Manna, MD;  Location: [facility] ORS;  Service: Thoracic;  Laterality: N/A;    Current Medications: Current Meds  Medication  Sig   anastrozole  (ARIMIDEX ) 1 MG tablet 1/2 tablet (0.5 mg) by mouth once weekly   aspirin  EC 81 MG tablet Take 1 tablet (81 mg total) by mouth daily. Swallow whole.   Cholecalciferol  50 MCG (2000 UT) CAPS Take 1 tablet by mouth daily.   cholestyramine  (QUESTRAN ) 4 g packet as needed (diarrea).   Cyanocobalamin  (VITAMIN B-12) 5000 MCG LOZG Take 1 lozenge by mouth daily.    DHEA  25 MG CAPS Take 25 mg by mouth daily.   escitalopram  (LEXAPRO ) 20 MG tablet TAKE 1 TABLET(20 MG) BY MOUTH DAILY   ezetimibe -simvastatin  (VYTORIN ) 10-20 MG tablet Take 1 tablet by mouth daily.   hydrALAZINE  (APRESOLINE ) 25 MG tablet Take 2 tabs p.o. in a.m. and q. afternoon, 1 tab p.o. in the evening   Hyoscyamine Sulfate SL 0.125 MG SUBL TAKE 1 TABLET UNDER TONGUE TWICE DAILY OR AS DIRECTED   ipratropium (ATROVENT ) 0.03 % nasal spray Place 2 sprays into both nostrils every 12 (twelve) hours.   ketoconazole (NIZORAL) 2 % shampoo Apply 1 Application topically once.   ketorolac (TORADOL) 10 MG tablet Take 1 tablet (10 mg total) by mouth every 6 (six) hours as needed.   linaclotide  (LINZESS ) 72 MCG capsule Take 1 capsule (72 mcg total) by mouth daily before breakfast.   Loperamide  HCl (IMODIUM  PO) Take by mouth as needed.    MAGNESIUM  BISGLYCINATE PO Take by mouth daily.   meclizine  (ANTIVERT ) 25 MG tablet Take 1 tablet (25 mg total) by mouth 3 (three) times daily as needed for dizziness.   melatonin 5 MG TABS Take 5 mg by mouth at bedtime.   MIEBO 1.338 GM/ML SOLN Place 1 drop into both eyes 4 (four) times daily.   NONFORMULARY OR COMPOUNDED ITEM Bi-Mix Papaverine 30mg , Phentolamine 1mg    Dosage: Inject 0.25cc-0.5cc as need for ED   Vial 1ml   Qty #5 Refills 6   [facility] [phone] Fax [phone]   Nutritional Supplements (NUTRITIONAL SUPPLEMENT PO) Take 1 capsule by mouth daily. [name]   ondansetron  (ZOFRAN -ODT) 4 MG disintegrating tablet Take 1 tablet (4 mg total) by mouth  every 8 (eight) hours as needed for nausea or vomiting.   PREVIDENT 5000 DRY MOUTH 1.1 % GEL dental gel SMARTSIG:To Teeth 3 Times Daily   Probiotic Product (PROBIOTIC DAILY PO) Take by mouth daily.   propranolol  (INDERAL ) 40 MG tablet Take 40 mg by mouth 2 (two) times daily.   RABEprazole  (ACIPHEX ) 20 MG tablet Take 2 tablets (40 mg total) by mouth 2 (two) times daily.   testosterone  cypionate (DEPOTESTOSTERONE CYPIONATE) 200 MG/ML injection Inject 80 mg into the muscle every 14 (fourteen) days.   traMADol (ULTRAM) 50 MG tablet Take 1 tablet (50 mg total) by mouth every 6 (six) hours as needed for severe pain (pain score 7-10) or moderate pain (pain score 4-6).   traZODone  (DESYREL ) 50 MG tablet Take 0.5-1 tablets (25-50 mg total) by mouth at bedtime.   triamcinolone cream (KENALOG) 0.1 % as needed (skin irritation, insect bites).   valACYclovir  (VALTREX ) 1000 MG tablet Take 2 tabs p.o. and repeat in 12 hours as needed for cold sore   Vibegron  (GEMTESA ) 75 MG TABS Take 1 tablet (75 mg total) by mouth daily.    Allergies:   Ace inhibitors, Fluoxetine, Metoclopramide, Nalbuphine, Other, Amoxicillin-pot clavulanate, Doxazosin, Duloxetine, Penicillins, Tamsulosin, Trazodone  and nefazodone, Alirocumab , Amlodipine, Cinoxacin, Ciprofloxacin, Fludrocortisone , Nebivolol, Olanzapine, Olmesartan, Pregabalin, Prostaglandins, Thyroid  hormones, Venlafaxine , Venlafaxine  hcl er, Zolpidem, Duloxetine hcl, Fluoxetine hcl, Nucynta  [tapentadol ], and Phenytoin   Social History   Socioeconomic History   Marital status: Married    Spouse name: Therapist, art   Number of children: Not on file   Years of education: Not on file   Highest education level: Master's degree (e.g., MA, MS, MEng, MEd, MSW, MBA)  Occupational History   Not on file  Tobacco Use   Smoking status: Never    Passive exposure: Never  Smokeless tobacco: Never  Vaping Use   Vaping status: Never Used  Substance and Sexual Activity   Alcohol use: Yes     Comment: 1 Drink a Month, socially   Drug use: Not Currently   Sexual activity: Yes    Birth control/protection: None  Other Topics Concern   Not on file  Social History Narrative   Tobacco use, amount per day now: None   Past tobacco use, amount per day: None   How many years did you use tobacco: 0   Alcohol use (drinks per week): 0-1 Month   Diet:   Do you drink/eat things with caffeine : Occasionally ( Hot Chocolate and maybe 1/4 of 16oz Pepsi 2-3 times a week.   Marital status: Married                                  What year were you married? 1970   Do you live in a house, apartment, assisted living, condo, trailer, etc.? House   Is it one or more stories? One   How many persons live in your home? 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 Yes                                    Type and how often? [place], Recumbent Bike, 3 times a week. Try to get a 1.2 mile walk at least 3 times a week or more.     Do you have a living will? Yes   Do you have a DNR form?  No                                 If not, do you want to discuss one?   Do you have signed POA/HPOA forms? Yes                       If so, please bring to you appointment    Do you have any difficulty bathing or dressing yourself? No    Do you have difficulty preparing food or eating? No   Do you have difficulty managing your medications? No   Do you have any difficulty managing your finances? No   Do you have any difficulty affording your medications? No         Social Drivers of Corporate investment banker Strain: Low Risk  (04/24/2024)   Overall Financial Resource Strain (CARDIA)    Difficulty of Paying Living Expenses: Not very hard  Food Insecurity: No Food Insecurity (04/24/2024)   Hunger Vital Sign    Worried About Running Out of Food in the  Last Year: Never true    Ran Out of Food in the Last Year: Never true  Transportation Needs: No Transportation Needs (04/24/2024)   PRAPARE - Administrator, Civil Service (Medical): No    Lack of Transportation (Non-Medical): No  Physical Activity: Insufficiently Active (12/24/2023)   Exercise Vital Sign    Days of Exercise per Week: 3 days    Minutes of Exercise per Session: 30 min  Stress: No Stress Concern Present (12/24/2023)   [employer] of Occupational Health - Occupational Stress Questionnaire    Feeling of Stress : Not at all  Social Connections: Moderately Isolated (04/24/2024)   Social Connection and Isolation Panel    Frequency of Communication with Friends and Family: Once a week    Frequency of Social Gatherings with Friends and Family: Once a week    Attends Religious Services: 1 to 4 times per year    Active Member of [employer] or Organizations: No    Attends Engineer, structural: Not on file    Marital Status: Married     Family History:  The patient's family history includes Anxiety disorder in his daughter; Dementia in his mother; Diabetes in his mother; Heart disease in his father; Heart failure in his father; Hypertension in his father; Kidney Stones in his daughter; OCD in his daughter; Stroke in his father and mother; Vision loss in his father.  ROS:   12-point review of systems is negative unless otherwise noted in the HPI.   EKGs/Labs/Other Studies Reviewed:    Studies reviewed were summarized above. The additional studies were reviewed today:  Renal artery ultrasound 04/09/2023: IMPRESSION: 1. No evidence of significant renal artery stenosis bilaterally. 2. Increased cortical echogenicity of both kidneys likely reflecting underlying component of chronic kidney disease. __________  2D echo 03/27/2023: 1. Left ventricular ejection fraction, by estimation, is 50 to 55%. The  left ventricle has low normal function. The left ventricle  has no regional  wall motion abnormalities. There is mild left ventricular hypertrophy.  Left ventricular diastolic  parameters are indeterminate.   2. Right ventricular systolic function is normal. The right ventricular  size is normal. There is normal pulmonary artery systolic pressure.   3. Left atrial size was severely dilated.   4. The mitral valve is normal in structure. Trivial mitral valve  regurgitation. No evidence of mitral stenosis.   5. The aortic valve is tricuspid. Aortic valve regurgitation is mild. No  aortic stenosis is present.   6. The inferior vena cava is normal in size with greater than 50%  respiratory variability, suggesting right atrial pressure of 3 mmHg.  __________  2D echo 11/28/2021: 1. Left ventricular ejection fraction, by estimation, is 50 to 55%. The  left ventricle has low normal function. The left ventricle demonstrates  regional wall motion abnormalities (select images with concern for  inferior and lateral wall hypokinesis).  Left ventricular diastolic parameters are indeterminate.   2. Right ventricular systolic function is normal. The right ventricular  size is normal. There is normal pulmonary artery systolic pressure. The  estimated right ventricular systolic pressure is 15.2 mmHg.   3. The mitral valve is normal in structure. Mild mitral valve  regurgitation. No evidence of mitral stenosis.   4. The aortic valve is normal in structure. Aortic valve regurgitation is  mild. No aortic stenosis is present.   5. The inferior vena cava is normal in size with greater than 50%  respiratory variability, suggesting right atrial pressure of 3 mmHg. __________   LHC 11/27/2021:   CULPRIT LESION: Prox Cx to Mid Cx lesion is 100% stenosed with 70% stenosed side branch in 2nd Mrg.   A drug-eluting stent was successfully placed using a STENT ONYX FRONTIER 2.5X26.  Deployed to 2.7 mm   Post intervention, there is a 0% residual stenosis. Post intervention,  the side branch was reduced to 60% residual stenosis.   -------------------------------------------------   LESION SEGMENT #  2: Ost LAD to Prox LAD lesion is 75% stenosed.  Prox LAD to Mid LAD lesion is 80% stenosed. Mid LAD lesion is 99% stenosed. (total length ~32 mm)   Prox LAD to Mid LAD lesion is 80% stenosed.   A drug-eluting stent was successfully placed covering all 3 lesions, using a STENT ONYX FRONTIER 2.5X34.  Postdilated to 2.8 mm   Post intervention, there is a 0% residual stenosis.   -------------------------------------------------   Moderately calcified very large dominant RCA with mild disease throughout.   -------------------------------------------------   Post intervention, there is a 0% residual stenosis.   There is mild left ventricular systolic dysfunction. The left ventricular ejection fraction is 45-50% by visual estimate. -Lateral wall hypokinesis   LV end diastolic pressure is severely elevated.   There is no aortic valve stenosis.   SUMMARY Acute Coronary Syndrome / NSTEMI (with STEMI physiology) Severe two-vessel CAD :  Culprit lesion 100% occluded proximal and mid LCx  Sequential ostial proximal LAD 75, 80 to 99% stenosis Successful revascularization of occluded LCx with Onyx Frontier 2.5 mm x 26 mm deployed to 2.7 mm.  TIMI 0 flow improved to TIMI-3.  Also TIMI-3 flow noted in major/small caliber OM branch with an ostial 60 to 70% stenosis (not PCI during Successful ostial to mid LAD PCI (3 tandem lesions ~total length 32 mmm) covering the total lesion segment with an Onyx Frontier 2.5 mm x 34 mm-deployed/postdilated to 2.8 mm Heavily calcified but widely patent large dominant RCA that did provide collaterals to the occluded LCx Mildly reduced EF with lateral hypokinesis and significant elevated LVEDP of 28 mmHg     RECOMMENDATIONS ICU admission for post PCI care. Very complex medication list, will need to figure out the best way to treat lipids.  He is on  propranolol  which was the only tolerated beta-blocker.  SABRA DAPT per recommendations section Check 2D echo Pending echo results, and symptoms, could potentially consider discharge in 2 to 3 days. __________   2D echo 12/11/2019: 1. Left ventricular ejection fraction, by estimation, is 60 to 65%. The  left ventricle has normal function. The left ventricle has no regional  wall motion abnormalities. Left ventricular diastolic parameters are  consistent with Grade I diastolic  dysfunction (impaired relaxation).   2. Right ventricular systolic function is normal. The right ventricular  size is normal. There is normal pulmonary artery systolic pressure.   3. The mitral valve is normal in structure. No evidence of mitral valve  regurgitation. No evidence of mitral stenosis.   4. The aortic valve is grossly normal. Aortic valve regurgitation is mild  to moderate. No aortic stenosis is present.   5. Pulmonic valve regurgitation not assessed.   6. Aortic dilatation noted. 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   7. The inferior vena cava is normal in size with greater than 50%  respiratory variability, suggesting right atrial pressure of 3 mmHg.   EKG:  EKG from recent ED visit 10/5 reviewed which showed sinus rhythm with RBBB, consistent with prior tracings.   Recent Labs: 07/06/2023: TSH 2.710 05/30/2024: Magnesium  2.3 06/18/2024: ALT 19; BUN 14; Creatinine, Ser 0.77; Hemoglobin 15.3; Platelets 202; Potassium 3.9; Sodium 143  Recent Lipid Panel    Component Value Date/Time   CHOL 110 04/28/2024 1609   CHOL 108 10/15/2023 0859   TRIG 76 04/28/2024 1609   HDL 48 04/28/2024 1609   HDL 43 10/15/2023 0859   CHOLHDL 2.3 04/28/2024 1609  VLDL 14 03/26/2023 0629   LDLCALC 46 04/28/2024 1609   LDLDIRECT 98.5 01/30/2022 1535    PHYSICAL EXAM:    VS:  BP (!) 140/70 (BP Location: Left Arm, Patient Position: Sitting, Cuff Size: Normal)   Pulse 66   Ht 5' 9 (1.753 m)    Wt 146 lb 3.2 oz (66.3 kg)   SpO2 98%   BMI 21.59 kg/m   BMI: Body mass index is 21.59 kg/m.  Physical Exam Vitals and nursing note reviewed.  Constitutional:      Appearance: Normal appearance.  Cardiovascular:     Rate and Rhythm: Normal rate and regular rhythm.     Heart sounds: No murmur heard.    No gallop.  Pulmonary:     Effort: Pulmonary effort is normal.     Breath sounds: Normal breath sounds. No wheezing or rales.  Musculoskeletal:     Right lower leg: No edema.     Left lower leg: No edema.  Skin:    General: Skin is warm and dry.  Neurological:     General: No focal deficit present.     Mental Status: He is alert and oriented to person, place, and time. Mental status is at baseline.  Psychiatric:        Mood and Affect: Mood normal.        Behavior: Behavior normal.     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  PCI with DES to proximal L CX and proximal LAD in the setting of NSTEMI 11/2021.  No symptoms concerning for angina.  He is continued on aspirin  and Vytorin .  HTN  - Blood pressure mildly elevated today.  Patient brings blood pressure log which shows labile readings 100 to 150 mmHg systolic.  Higher readings are likely secondary to increased pain.  Will avoid overmedication in this setting. Hydralazine  recently increased by PCP to 50 mg in the morning and afternoon with 25 mg in the evening.   HFpEF - Most recent echo 03/2023 with EF 50-55%. Euvolemic on exam. Not requiring standing diuretic. Will defer addition of MRA and SGLT2i in the setting of multiple medication intolerances.  HLD with hx of statin intolerance - LDL 46 in 04/2024, at goal. Continue Vyrorin.   Preoperative risk assessment - Patient is pending endoscopy and colonoscopy  in earlier November. Mr. Therien perioperative risk of a major cardiac event is 6.6% according to the Revised Cardiac Risk Index (RCRI). His functional capacity is fair at 5.13 METs according to the Duke Activity Status Index (DASI). According to ACC/AHA guidelines, no further cardiovascular testing needed.  The patient may proceed to surgery at acceptable risk.  He should remain on ASA throughout the perioperative period.    Disposition: F/u with Dr. Anner or an APP in 6 months.   Medication Adjustments/Labs and Tests Ordered: Current medicines are reviewed at length with the patient today.  Concerns regarding medicines are outlined above. Medication changes, Labs and Tests ordered today are summarized above and listed in the Patient Instructions accessible in Encounters.   Bonney Lesley Maffucci, PA-C 06/20/2024 11:21 AM     [facility] [place] [address] [place], [place] [address] [phone]

## 2024-06-19 NOTE — Telephone Encounter (Signed)
 Does the patient need to continue the Linzess  72 mcg daily. Patient says he only has a few of the samples we gave him left and he is still having some issues with constipation. He is having to strain, and has around one bm per day and they are hard to get out. Patient reports he went to the Ed yesterday due to left flank pain and was given Tramadol and Toradol, he feels this will make his constipation worse and wanted to know if you recommended to stay on the Linzess  72 mcg once per day, or if he needed an increased dose of this? If so patient will need a script to be sent to [facility] in [place], [place] off of [address]. Please advise.

## 2024-06-20 ENCOUNTER — Encounter: Payer: Self-pay | Admitting: Physician Assistant

## 2024-06-20 ENCOUNTER — Other Ambulatory Visit (INDEPENDENT_AMBULATORY_CARE_PROVIDER_SITE_OTHER): Payer: Self-pay

## 2024-06-20 ENCOUNTER — Ambulatory Visit: Attending: Physician Assistant | Admitting: Physician Assistant

## 2024-06-20 VITALS — BP 140/70 | HR 66 | Ht 69.0 in | Wt 146.2 lb

## 2024-06-20 DIAGNOSIS — Z01810 Encounter for preprocedural cardiovascular examination: Secondary | ICD-10-CM | POA: Insufficient documentation

## 2024-06-20 DIAGNOSIS — R0989 Other specified symptoms and signs involving the circulatory and respiratory systems: Secondary | ICD-10-CM | POA: Diagnosis not present

## 2024-06-20 DIAGNOSIS — E785 Hyperlipidemia, unspecified: Secondary | ICD-10-CM | POA: Diagnosis not present

## 2024-06-20 DIAGNOSIS — I2510 Atherosclerotic heart disease of native coronary artery without angina pectoris: Secondary | ICD-10-CM | POA: Insufficient documentation

## 2024-06-20 DIAGNOSIS — I5032 Chronic diastolic (congestive) heart failure: Secondary | ICD-10-CM | POA: Diagnosis present

## 2024-06-20 MED ORDER — LINACLOTIDE 145 MCG PO CAPS: 145.0000 ug | ORAL_CAPSULE | Freq: Every day | ORAL | 1 refills | Status: DC

## 2024-06-20 NOTE — Telephone Encounter (Signed)
 Since he is still straining and have hard stools on Linzess  , I will increase it to 145mcg

## 2024-06-20 NOTE — Telephone Encounter (Signed)
 I spoke with the patient and made him aware per Dr. Cinderella, Since he is still straining and have hard stools on Linzess  , I will increase it to . Patient states understanding.

## 2024-06-20 NOTE — Patient Instructions (Signed)
 Medication Instructions:  Your physician recommends that you continue on your current medications as directed. Please refer to the Current Medication list given to you today.   *If you need a refill on your cardiac medications before your next appointment, please call your pharmacy*  Lab Work: None ordered at this time   Follow-Up: At [facility], you and your health needs are our priority.  As part of our continuing mission to provide you with exceptional heart care, our providers are all part of one team.  This team includes your primary Cardiologist (physician) and Advanced Practice Providers or APPs (Physician Assistants and Nurse Practitioners) who all work together to provide you with the care you need, when you need it.  Your next appointment:   6 month(s)  Provider:   You may see Alm Clay, MD or Bernardino Bring, PA-C

## 2024-06-21 ENCOUNTER — Ambulatory Visit
Admission: RE | Admit: 2024-06-21 | Discharge: 2024-06-21 | Disposition: A | Discharge status: Home / Self Care | Source: Ambulatory Visit | Attending: Gastroenterology | Admitting: Gastroenterology

## 2024-06-21 DIAGNOSIS — R1084 Generalized abdominal pain: Secondary | ICD-10-CM | POA: Diagnosis present

## 2024-06-21 MED ORDER — IOHEXOL 350 MG/ML SOLN
100.0000 mL | Freq: Once | INTRAVENOUS | Status: AC | PRN
  Administered 2024-06-21: 100 mL via INTRAVENOUS

## 2024-06-21 NOTE — H&P (View-Only) (Signed)
 06/22/2024 3:51 PM   Grant Young November 30, 1945 [id number]  Referring provider: Antonette Angeline LELON, NP [address] [place],  [place] [address]  Urological history: 1. 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 BPH with LU TS - PSA (05/2023) 0.4 - green light PVP in remote past - prostate volume (TRUS 2022) 15 gram  - cysto (10/2020) NED  3. OAB - Gemtesa  75 mg daily  4. ED - BiMix  5. Nephrolithiasis - contrast CT (2024) through the kidneys. No hydronephrosis. There are bilateral intrarenal calculi, largest stone on the right is in the lower pole measuring 7 mm.   Chief Complaint  Patient presents with   Follow-up   Nephrolithiasis   HPI: Grant Young is a 78 y.o. man who presents today for kidney stone follow up.   Previous records reviewed.  He presented to the ED on June 18, 2024 for left-sided flank pain that radiated into the groin that began earlier that day.  It was 10 out of 10 pain that was waxing and waning and associated with mild nausea.  UA with greater than 50 RBCs, WBC count of 7.6, lactic acid at 0.8, and serum creatinine 0.77.  CT renal stone protocol revealed a 3.3 mm calculus in the proximal left ureter with mild obstructive uropathy.  Bilateral nephrolithiasis.  He was discharged on Toradol, Ultram and Zofran .  He is continuing to have left-sided flank pain.  He has not passed fragments.  Patient denies any modifying or aggravating factors.  Patient denies any recent UTI's, gross hematuria, dysuria or suprapubic/flank pain.  Patient denies any fevers, chills, nausea or vomiting.  He does not feel that the Toradol and Ultram are controlling his renal colic pain.  He is still experiencing   Angio CT performed yesterday still demonstrates the left proximal stone.    He has had a remote history of stones approximately 30 years ago.  He has been treated with ureteroscopy and stent placement.  He had been on chlorthalidone  at that time.  He is no longer  taking that medication.  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   Kidney Stones   Clotting disorder 01-2022   Plavix    Coronary artery disease    a.) s/p PCI with DES x 2 (pLCx and o-mLAD) 11/27/2021   Depression    Erectile dysfunction    a.) Bi-mix (papaverine + phentolamine) injections + exogenous testosterone  injections   GERD (gastroesophageal reflux disease)    h/o Lyme disease    Headache    Hiatal hernia    History of bilateral cataract extraction 01/2021   History of gastritis    History of kidney stones    History of neuropathy    Hyperlipidemia LDL goal <70    a.) CAD with NSTEMI -->  intolerant of statins with statin myopathy and memory issues.;  Also intolerant of Repatha    Hypertension    Labile blood pressures but dizziness with blood pressures in the normal range; intolerant of most medications including ARB's, ACE inhibitor's, amlodipine most beta-blockers other than propranolol .   Insomnia  a.) takes malatonin PRN   Left lower lobe pulmonary nodule    a.) chest CT 12/07/2022: nodules x 2 posteromedial LLL;  8 x 11 mm and 8 x 10 mm   Long term current use of antithrombotics/antiplatelets    a.) DAPT (ASA + clopidogrel )   Long term current use of aromatase inhibitor    a,) anastrozole  --> estridiol suppression secondary to exogenous testosterone  use   Lumbar radicular pain    Myocardial infarction (HCC)    a.) MI x 2 - 1990 * 1999 - PTCA   NSTEMI (non-ST elevated myocardial infarction) (HCC) 11/27/2021   a.) LHC 11/28/2021: sequential 75% o-pLAD, 70% OM2, 80% p-mLAD, 99% mLAD, 100% pLCx --> PCI placing a 2.5 x 26 mm Onyx Frontier DES to pLCx and a 2.5 x 24 mm Onyx Frontier DES to the  o-m LAD (covering 3 lesions)   Overactive bladder    Pneumonia    PONV (postoperative nausea and vomiting)    Recurrent herpes labialis    a.) has suppressive valacyclovir  to use PRN   Skin cancer, basal cell    Spinal cord stimulator status    01/08/21 - not currently using.   Squamous cell skin cancer    Substance abuse (HCC)    Syncope and collapse 2022    Surgical History: Past Surgical History:  Procedure Laterality Date   BACK SURGERY     BASAL CELL CARCINOMA EXCISION     BIOPSY  07/27/2023   Procedure: BIOPSY;  Surgeon: Therisa Bi, MD;  Location: [facility] ENDOSCOPY;  Service: Gastroenterology;;   CATARACT EXTRACTION W/PHACO Left 01/14/2021   Procedure: CATARACT EXTRACTION PHACO AND INTRAOCULAR LENS PLACEMENT (IOC) LEFT VIVITY TORIC LENS 8.75 00:56.7;  Surgeon: Jaye Fallow, MD;  Location: [facility];  Service: Ophthalmology;  Laterality: Left;   CATARACT EXTRACTION W/PHACO Right 01/28/2021   Procedure: CATARACT EXTRACTION PHACO AND INTRAOCULAR LENS PLACEMENT (IOC) RIGHT VIVITY TORIC LENS;  Surgeon: Jaye Fallow, MD;  Location: [facility];  Service: Ophthalmology;  Laterality: Right;  6.54 00:46.4   CHOLECYSTECTOMY  2017   COLONOSCOPY  09/15/2015   Per New Patient Packet   COLONOSCOPY N/A 01/26/2024   Procedure: COLONOSCOPY;  Surgeon: Therisa Bi, MD;  Location: [facility] ENDOSCOPY;  Service: Gastroenterology;  Laterality: N/A;   COLONOSCOPY WITH PROPOFOL  N/A 10/03/2020   Procedure: COLONOSCOPY WITH PROPOFOL ;  Surgeon: Therisa Bi, MD;  Location: [facility] ENDOSCOPY;  Service: Gastroenterology;  Laterality: N/A;   CORONARY ANGIOPLASTY  1998, 2023   CORONARY/GRAFT ACUTE MI REVASCULARIZATION N/A 11/27/2021   Procedure: Coronary/Graft Acute MI Revascularization;  Surgeon: Anner Alm ORN, MD;  Location: [facility] CATH: (NSTEMI) - > 100% prox-mid LCx (Onyx Frontier DES 2.5 x 26 -> 2.7 mm, Ost  OM1 65%.  Ost-mid LAD 3 lesions 75%, 90%, 99% => DES PCI Onyx Frontier DES  2.5 x 34 -> 2.8 mm   ESOPHAGOGASTRODUODENOSCOPY (EGD) WITH PROPOFOL  N/A 10/03/2020   Procedure: ESOPHAGOGASTRODUODENOSCOPY (EGD) WITH PROPOFOL ;  Surgeon: Therisa Bi, MD;  Location: [facility] ENDOSCOPY;  Service: Gastroenterology;  Laterality: N/A;   ESOPHAGOGASTRODUODENOSCOPY (EGD) WITH PROPOFOL  N/A 07/27/2023   Procedure: ESOPHAGOGASTRODUODENOSCOPY (EGD) WITH PROPOFOL ;  Surgeon: Therisa Bi, MD;  Location: [facility] ENDOSCOPY;  Service: Gastroenterology;  Laterality: N/A;   FOOT SURGERY Bilateral 03/18/2020   FOOT SURGERY  08/032021   GALLBLADDER SURGERY  09/15/2015   Gallbladder Removal. Procedure done by Dr.Beverly. Per New Patient Packet   KIDNEY STONE SURGERY  09/14/1980   Too many to count. Per New Patient Packet 09/14/1980-09/15/1995  KIDNEY STONE SURGERY  09/15/1995   Too many to count. Per New Patient Packet   LEFT HEART CATH AND CORONARY ANGIOGRAPHY N/A 11/27/2021   Procedure: LEFT HEART CATH AND CORONARY ANGIOGRAPHY;  Surgeon: Anner Alm ORN, MD;  Location: [facility] CATH:  NSTEMI - 2 V CAD: 100% thrombotic prox-mid LCx (DES PCI), ost OM1 65%; Ost LAD 75% - prox LAD 90%, prox-mid 99% (DES PCI).  MIld diffuse RCA disease - calcicifed.  R-L collaterals filling LCx. EF ~45-50% with lateral HK. LVEDP 28 mmHg   LITHOTRIPSY  09/14/2014   Per New Patient Packet   LITHOTRIPSY  09/14/1996   No Stints Used. Per New Patient Packet   LITHOTRIPSY  09/14/1997   No Stints used. Per New Patient Packet   PAIN PUMP IMPLANTATION     PAIN PUMP REMOVAL     SHOULDER SURGERY Right 1984   SIGMOIDOSCOPY  09/14/2017   Per New Patient Packet   SPINAL CORD STIMULATOR IMPLANT     SPINE SURGERY  1999, 2000   Fusion, Inst. Fusion   TONSILLECTOMY     TRANSTHORACIC ECHOCARDIOGRAM  11/28/2021   (in setting of non-STEMI): EF 50 to 55%.  Suspect inferior and lateral hypokinesis.  Indeterminate diastolic parameters.  Normal RV size and function.  Normal RAP.  Normal aortic and mitral valves with only mild MR and AI.   Normal RVP and RAP.   UPPER GI ENDOSCOPY     VIDEO BRONCHOSCOPY WITH ENDOBRONCHIAL ULTRASOUND N/A 01/15/2023   Procedure: VIDEO BRONCHOSCOPY WITH ENDOBRONCHIAL ULTRASOUND;  Surgeon: Parris Manna, MD;  Location: [facility] OR[facility];  Service: Thoracic;  Laterality: N/A;    Home Medications:  Allergies as of 06/22/2024       Reactions   Ace Inhibitors    Other reaction(s): Cough   Fluoxetine Anxiety   made me fall asleep per pt bad headaches and makes  Me  Crazy historical allergy noted in [employer]   Metoclopramide    Other reaction(s): Other (See Comments), Other (See Comments), Unknown Tardive Dyskinesia  historical allergy noted in [employer]   Nalbuphine    Used Post Back surgery- Anesthesiologist Error. Patient had Narcotic Withdraw. Per New Patient Packet.    Other    Other reaction(s): Other (See Comments) Altered mental status in combo with narcotics at previous hospitalization - Full Withdrawal Symptoms Other reaction(s): Rash   Amoxicillin-pot Clavulanate Nausea Only   Per New Patient Packet.   Doxazosin Rash   Other reaction(s): Other - See Comments, Rash   Duloxetine Nausea Only   Per New Patient Packet.    Penicillins Nausea Only   Per New Patient Packet.   Tamsulosin Itching, Anxiety   Restless, Flushing, Heavy Chest, Itching, Hyperactive mood and Anxiety. Unable to handle side effects. Per New Patient Packet.    Trazodone  And Nefazodone Itching, Anxiety, Rash   Headache. INCREASED MY ANXIETY AND HEARTRATE Flushing, tachycardia INCREASED MY ANXIETY AND HEARTRATE Per New Patient Packet.   Alirocumab  Other (See Comments)   Amlodipine    Shaking, unsure of reaction. Per New Patient Packet.    Cinoxacin    GI Intolerance, and Dizziness. Per New Patient Packet.    Ciprofloxacin    Other reaction(s): Unknown   Fludrocortisone  Other (See Comments)   Worsening headaches, GI issues, fatigue   Nebivolol    Other reaction(s): Unknown   Olanzapine    Headache and  unable to sleep for 3 nights. Per New Patient Packet.   Olmesartan    Other reaction(s): Unknown   Pregabalin  Confusion, Lack of concentration, dizziness, and likely drowsiness. Per New Patient Packet.    Prostaglandins    Other reaction(s): Other (See Comments) Intolerance   Thyroid  Hormones    Other reaction(s): Other (See Comments) Thyroid  (Nature Thyroid ) contraindicated with some of your other medications.   Venlafaxine  Other (See Comments)   High blood pressure- hospitalized  venlafaxine    Venlafaxine  Hcl Er Hypertension   Zolpidem    Nightmares, Ineffective after 2 days. Per New Patient Packet.    Duloxetine Hcl    Other reaction(s): Rash   Fluoxetine Hcl    Other reaction(s): Rash   Nucynta  [tapentadol ] Other (See Comments)   Vertigo    Phenytoin Anxiety   Hyperactivity, and Ineffective. Per New Patient Packet.         Medication List        Accurate as of June 22, 2024 11:59 PM. If you have any questions, ask your nurse or doctor.          STOP taking these medications    traMADol 50 MG tablet Commonly known as: Ultram Stopped by: Angeligue Bowne       TAKE these medications    anastrozole  1 MG tablet Commonly known as: ARIMIDEX  Take 0.5 mg by mouth once a week.   aspirin  EC 81 MG tablet Take 1 tablet (81 mg total) by mouth daily. Swallow whole.   Cholecalciferol  50 MCG (2000 UT) Caps Take 2,000 Units by mouth daily.   cholestyramine  4 g packet Commonly known as: QUESTRAN  Take 4 g by mouth daily as needed (diarrhea).   DHEA 25 MG Caps Take 25 mg by mouth daily.   escitalopram  20 MG tablet Commonly known as: LEXAPRO  TAKE 1 TABLET(20 MG) BY MOUTH DAILY   ezetimibe -simvastatin  10-20 MG tablet Commonly known as: VYTORIN  Take 1 tablet by mouth daily.   Gemtesa  75 MG Tabs Generic drug: Vibegron  Take 1 tablet (75 mg total) by mouth daily.   hydrALAZINE  25 MG tablet Commonly known as: APRESOLINE  Take 2 tabs p.o. in a.m. and q.  afternoon, 1 tab p.o. in the evening   HYDROcodone -acetaminophen  5-325 MG tablet Commonly known as: NORCO/VICODIN Take 1 tablet by mouth every 6 (six) hours as needed for moderate pain (pain score 4-6). Started by: CLOTILDA CORNWALL   Hyoscyamine Sulfate SL 0.125 MG Subl Take 1 tablet by mouth 2 (two) times daily as needed (Abdomal pain).   ipratropium 0.03 % nasal spray Commonly known as: ATROVENT  Place 2 sprays into both nostrils every 12 (twelve) hours. What changed:  when to take this reasons to take this   ketoconazole 2 % shampoo Commonly known as: NIZORAL Apply 1 Application topically daily. With shower   linaclotide  145 MCG Caps capsule Commonly known as: LINZESS  Take 1 capsule (145 mcg total) by mouth daily.   loperamide  2 MG tablet Commonly known as: IMODIUM  A-D Take 2 mg by mouth 4 (four) times daily as needed for diarrhea or loose stools.   MAGNESIUM  BISGLYCINATE PO Take 1 tablet by mouth daily.   meclizine  25 MG tablet Commonly known as: ANTIVERT  Take 1 tablet (25 mg total) by mouth 3 (three) times daily as needed for dizziness.   melatonin 5 MG Tabs Take 5 mg by mouth at bedtime.   Miebo 1.338 GM/ML Soln Generic drug: Perfluorohexyloctane Place 1 drop into both eyes daily as needed (Dry eyes).   NONFORMULARY OR COMPOUNDED ITEM Bi-Mix Papaverine 30mg , Phentolamine 1mg    Dosage: Inject 0.25cc-0.5cc as need for ED   Vial 1ml  Trina KINGS Refills 6   [facility] [phone] Fax [phone] What changed:  when to take this reasons to take this additional instructions   NUTRITIONAL SUPPLEMENT PO Take 1 capsule by mouth daily. [name]   ondansetron  4 MG disintegrating tablet Commonly known as: ZOFRAN -ODT Take 1 tablet (4 mg total) by mouth every 8 (eight) hours as needed for nausea or vomiting.   polyethylene glycol-electrolytes 420 g solution Commonly known as: NuLYTELY  as directed.   PreviDent 5000 Dry Mouth 1.1 %  Gel dental gel Generic drug: sodium fluoride Place 1 Application onto teeth 3 (three) times daily.   PROBIOTIC DAILY PO Take 1 capsule by mouth daily.   propranolol  40 MG tablet Commonly known as: INDERAL  Take 40 mg by mouth 2 (two) times daily.   RABEprazole  20 MG tablet Commonly known as: ACIPHEX  Take 2 tablets (40 mg total) by mouth 2 (two) times daily.   testosterone  cypionate 200 MG/ML injection Commonly known as: DEPOTESTOSTERONE CYPIONATE Inject 80 mg into the muscle every 14 (fourteen) days.   traZODone  50 MG tablet Commonly known as: DESYREL  Take 0.5-1 tablets (25-50 mg total) by mouth at bedtime.   triamcinolone cream 0.1 % Commonly known as: KENALOG as needed (skin irritation, insect bites).   valACYclovir  1000 MG tablet Commonly known as: VALTREX  Take 2 tabs p.o. and repeat in 12 hours as needed for cold sore   Vitamin B-12 5000 MCG Lozg Take 1 lozenge by mouth daily.        Allergies:  Allergies  Allergen Reactions   Ace Inhibitors     Other reaction(s): Cough   Fluoxetine Anxiety    made me fall asleep per pt bad headaches and makes  Me  Crazy historical allergy noted in [employer]   Metoclopramide     Other reaction(s): Other (See Comments), Other (See Comments), Unknown Tardive Dyskinesia  historical allergy noted in [employer]   Nalbuphine     Used Post Back surgery- Anesthesiologist Error. Patient had Narcotic Withdraw. Per New Patient Packet.    Other     Other reaction(s): Other (See Comments) Altered mental status in combo with narcotics at previous hospitalization - Full Withdrawal Symptoms Other reaction(s): Rash   Amoxicillin-Pot Clavulanate Nausea Only    Per New Patient Packet.   Doxazosin Rash    Other reaction(s): Other - See Comments, Rash     Duloxetine Nausea Only    Per New Patient Packet.    Penicillins Nausea Only    Per New Patient Packet.   Tamsulosin Itching and Anxiety    Restless, Flushing, Heavy Chest,  Itching, Hyperactive mood and Anxiety. Unable to handle side effects. Per New Patient Packet.     Trazodone  And Nefazodone Itching, Anxiety and Rash    Headache. INCREASED MY ANXIETY AND HEARTRATE Flushing, tachycardia INCREASED MY ANXIETY AND HEARTRATE Per New Patient Packet.    Alirocumab  Other (See Comments)   Amlodipine     Shaking, unsure of reaction. Per New Patient Packet.     Cinoxacin     GI Intolerance, and Dizziness. Per New Patient Packet.    Ciprofloxacin     Other reaction(s): Unknown   Fludrocortisone  Other (See Comments)    Worsening headaches, GI issues, fatigue   Nebivolol     Other reaction(s): Unknown   Olanzapine     Headache and unable to sleep for 3 nights. Per New Patient Packet.    Olmesartan     Other reaction(s): Unknown   Pregabalin  Confusion, Lack of concentration, dizziness, and likely drowsiness. Per New Patient Packet.     Prostaglandins     Other reaction(s): Other (See Comments) Intolerance   Thyroid  Hormones     Other reaction(s): Other (See Comments) Thyroid  (Nature Thyroid ) contraindicated with some of your other medications.   Venlafaxine  Other (See Comments)    High blood pressure- hospitalized   venlafaxine    Venlafaxine  Hcl Er Hypertension   Zolpidem     Nightmares, Ineffective after 2 days. Per New Patient Packet.    Duloxetine Hcl     Other reaction(s): Rash   Fluoxetine Hcl     Other reaction(s): Rash   Nucynta  [Tapentadol ] Other (See Comments)    Vertigo    Phenytoin Anxiety    Hyperactivity, and Ineffective. Per New Patient Packet.     Family History: Family History  Problem Relation Age of Onset   Heart disease Father    Heart failure Father    Hypertension Father    Stroke Father    Vision loss Father    Stroke Mother    Dementia Mother    Diabetes Mother    Kidney Stones Daughter    Anxiety disorder Daughter    OCD Daughter     Social History:  reports that he has never smoked. He has never  been exposed to tobacco smoke. He has never used smokeless tobacco. He reports current alcohol use. He reports that he does not currently use drugs.  ROS: Pertinent ROS in HPI  Physical Exam: BP (!) 169/79 (BP Location: Left Arm, Patient Position: Sitting, Cuff Size: Normal)   Pulse 62   Ht 5' 9 (1.753 m)   Wt 144 lb (65.3 kg)   SpO2 97%   BMI 21.27 kg/m   Constitutional:  Well nourished. Alert and oriented, No acute distress. HEENT: [place] AT, moist mucus membranes.  Trachea midline Cardiovascular: No clubbing, cyanosis, or edema. Respiratory: Normal respiratory effort, no increased work of breathing. Neurologic: Grossly intact, no focal deficits, moving all 4 extremities. Psychiatric: Normal mood and affect.  Laboratory Data: See HPI and Epic I have reviewed the labs.   Pertinent Imaging: CLINICAL DATA:  Abdominal/flank pain, stone suspected. Left-sided abdominal pain radiating to back.   EXAM: CT ABDOMEN AND PELVIS WITHOUT CONTRAST   TECHNIQUE: Multidetector CT imaging of the abdomen and pelvis was performed following the standard protocol without IV contrast.   RADIATION DOSE REDUCTION: This exam was performed according to the departmental dose-optimization program which includes automated exposure control, adjustment of the mA and/or kV according to patient size and/or use of iterative reconstruction technique.   COMPARISON:  05/30/2024.   FINDINGS: Lower chest: Coronary artery calcification is noted. Atelectasis or scarring is present at the lung bases. There are stable nodular densities at the lung bases.   Hepatobiliary: No focal liver abnormality is seen. Status post cholecystectomy. No biliary dilatation.   Pancreas: Unremarkable. No pancreatic ductal dilatation or surrounding inflammatory changes.   Spleen: Normal in size without focal abnormality.   Adrenals/Urinary Tract: The adrenal glands are within normal limits. Renal calculi are present  bilaterally. There is no hydroureteronephrosis on the right. There is a 3 x 3 mm calculus in the proximal left ureter with mild obstructive uropathy. The bladder is within normal limits.   Stomach/Bowel: The stomach is within normal limits. No bowel obstruction, free air, or pneumatosis is seen. The appendix is not seen.   Vascular/Lymphatic: Aortic atherosclerosis. No enlarged abdominal or pelvic lymph nodes.   Reproductive:  Prostate is unremarkable.   Other: No abdominopelvic ascites. A nerve stimulator device is present in the left flank with leads terminating in the thoracic spinal canal.   Musculoskeletal: Degenerative changes are present in the thoracolumbar spine. Spinal fusion hardware is present from L5-S1. No acute osseous abnormality is seen.   IMPRESSION: 1. Mild obstructive uropathy on the left with a 3 x 3 mm calculus in the proximal left ureter. 2. Bilateral nephrolithiasis. 3. Nodular opacities at the lung bases, not significantly changed from the prior exam, which may be infectious or inflammatory as compared with prior exams. 4. Coronary artery calcification. 5. Aortic atherosclerosis.     Electronically Signed   By: Leita Birmingham M.D.   On: 06/18/2024 14:27 I have independently reviewed the films.  See HPI.   Assessment & Plan:    1. Left ureteral stone - We discussed various treatment options for urolithiasis including observation with or without medical expulsive therapy, shockwave lithotripsy (SWL), ureteroscopy and laser lithotripsy with stent placement and given stone size, stone location and stone density, explained that ESWL has a lower stone-free rate in a single procedure, but also a lower complication rate compared to ureteroscopy, and avoids a stent and associated stent related symptoms. Possible complications include renal hematoma, steinstrasse, and the need for additional treatment, explained that ureteroscopy with laser lithotripsy and stent  placement has a higher stone-free rate than SWL in a single procedure; however, there is an increased complication rate, including possible infection, ureteral injury, bleeding, and stent-related morbidity. Common stent-related symptoms include dysuria, urgency/frequency, and flank pain. - he is wanted to have the procedure with the highest probability of successfully addressing the stone and that would be ureteroscopy - we will go ahead with left ureteroscopy with laser lithotripsy and ureteral stent placement - explained to the patient how the procedure is performed and the risks involved -informed the patient that they will have an ureteral stent, which will remain in place for approximately 3-10 days and can be associated with flank pain, bladder pain, dysuria, urgency, frequency, urinary leakage, and gross hematuria. - stent may be removed in the office with a cystoscope or patient may be instructed to remove the stent themselves by the string and that is decided on the day of the procedure - residual stones within the kidney or ureter may be present after the procedure and may need to have these addressed at a different encounter - injury to the ureter is the most common intra-operative risk, it may result in an open procedure to correct the defect - infection and bleeding are also risks - explained the risks of general anesthesia, such as: MI, CVA, paralysis, coma and/or death. - advised to contact our office or seek treatment in the ED if becomes febrile or pain/ vomiting are difficult control in order to arrange for emergent/urgent intervention - UA bland - urine culture pending - he is given a strainer and instructed to strain his urine and if he should pass the stone, he will contact us , so we can cancel his surgery after we confirm passage of the stone  - discontinue Ultram - use hydrocodone /APAP 5/325, q 4 hours prn for severe pain - encouraged to take ibuprofen 200 mg, q 4 hours for  mild to moderate pain   2. OAB - has follow up with Dr. Francisca next September     Return for Left ureteroscopy with laser lithotripsy with stent placement .  These notes generated with voice recognition software. I apologize  for typographical errors.  CLOTILDA HELON RIGGERS  [facility] [address]  [address] [place], [place] [address] [phone]  Surgical Physician Order [facility]  * Scheduling expectation : 10/13 w/ Dr. Francisca   *Length of Case:   *Clearance needed: no  *Anticoagulation Instructions: May continue all anticoagulants  *Aspirin  Instructions: Ok to continue all  *Post-op visit Date/Instructions:  TBD  *Diagnosis: Left Ureteral Stone  *Procedure: left Ureteroscopy w/laser lithotripsy & stent placement ([id number])   Additional orders: N/A  -Admit type: OUTpatient  -Anesthesia: General  -VTE Prophylaxis Standing Order SCD's       Other:   -Standing Lab Orders Per Anesthesia    Lab other: None  -Standing Test orders EKG/Chest x-ray per Anesthesia       Test other:   - Medications:  Ancef 2gm IV  -Other orders:  N/A

## 2024-06-21 NOTE — Progress Notes (Signed)
 06/22/2024 3:51 PM   [place] November 30, 1945 [id number]  Referring provider: Antonette Angeline LELON, NP [address] [place],  [place] [address]  Urological history: 1. 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 BPH with LU TS - PSA (05/2023) 0.4 - green light PVP in remote past - prostate volume (TRUS 2022) 15 gram  - cysto (10/2020) NED  3. OAB - Gemtesa  75 mg daily  4. ED - BiMix  5. Nephrolithiasis - contrast CT (2024) through the kidneys. No hydronephrosis. There are bilateral intrarenal calculi, largest stone on the right is in the lower pole measuring 7 mm.   Chief Complaint  Patient presents with   Follow-up   Nephrolithiasis   HPI: [place] is a 78 y.o. man who presents today for kidney stone follow up.   Previous records reviewed.  He presented to the ED on June 18, 2024 for left-sided flank pain that radiated into the groin that began earlier that day.  It was 10 out of 10 pain that was waxing and waning and associated with mild nausea.  UA with greater than 50 RBCs, WBC count of 7.6, lactic acid at 0.8, and serum creatinine 0.77.  CT renal stone protocol revealed a 3.3 mm calculus in the proximal left ureter with mild obstructive uropathy.  Bilateral nephrolithiasis.  He was discharged on Toradol, Ultram and Zofran .  He is continuing to have left-sided flank pain.  He has not passed fragments.  Patient denies any modifying or aggravating factors.  Patient denies any recent UTI's, gross hematuria, dysuria or suprapubic/flank pain.  Patient denies any fevers, chills, nausea or vomiting.  He does not feel that the Toradol and Ultram are controlling his renal colic pain.  He is still experiencing   Angio CT performed yesterday still demonstrates the left proximal stone.    He has had a remote history of stones approximately 30 years ago.  He has been treated with ureteroscopy and stent placement.  He had been on chlorthalidone  at that time.  He is no longer  taking that medication.  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   Kidney Stones   Clotting disorder 01-2022   Plavix    Coronary artery disease    a.) s/p PCI with DES x 2 (pLCx and o-mLAD) 11/27/2021   Depression    Erectile dysfunction    a.) Bi-mix (papaverine + phentolamine) injections + exogenous testosterone  injections   GERD (gastroesophageal reflux disease)    h/o Lyme disease    Headache    Hiatal hernia    History of bilateral cataract extraction 01/2021   History of gastritis    History of kidney stones    History of neuropathy    Hyperlipidemia LDL goal <70    a.) CAD with NSTEMI -->  intolerant of statins with statin myopathy and memory issues.;  Also intolerant of Repatha    Hypertension    Labile blood pressures but dizziness with blood pressures in the normal range; intolerant of most medications including ARB's, ACE inhibitor's, amlodipine most beta-blockers other than propranolol .   Insomnia  a.) takes malatonin PRN   Left lower lobe pulmonary nodule    a.) chest CT 12/07/2022: nodules x 2 posteromedial LLL;  8 x 11 mm and 8 x 10 mm   Long term current use of antithrombotics/antiplatelets    a.) DAPT (ASA + clopidogrel )   Long term current use of aromatase inhibitor    a,) anastrozole  --> estridiol suppression secondary to exogenous testosterone  use   Lumbar radicular pain    Myocardial infarction (HCC)    a.) MI x 2 - 1990 * 1999 - PTCA   NSTEMI (non-ST elevated myocardial infarction) (HCC) 11/27/2021   a.) LHC 11/28/2021: sequential 75% o-pLAD, 70% OM2, 80% p-mLAD, 99% mLAD, 100% pLCx --> PCI placing a 2.5 x 26 mm Onyx Frontier DES to pLCx and a 2.5 x 24 mm Onyx Frontier DES to the  o-m LAD (covering 3 lesions)   Overactive bladder    Pneumonia    PONV (postoperative nausea and vomiting)    Recurrent herpes labialis    a.) has suppressive valacyclovir  to use PRN   Skin cancer, basal cell    Spinal cord stimulator status    01/08/21 - not currently using.   Squamous cell skin cancer    Substance abuse (HCC)    Syncope and collapse 2022    Surgical History: Past Surgical History:  Procedure Laterality Date   BACK SURGERY     BASAL CELL CARCINOMA EXCISION     BIOPSY  07/27/2023   Procedure: BIOPSY;  Surgeon: Therisa Bi, MD;  Location: [facility] ENDOSCOPY;  Service: Gastroenterology;;   CATARACT EXTRACTION W/PHACO Left 01/14/2021   Procedure: CATARACT EXTRACTION PHACO AND INTRAOCULAR LENS PLACEMENT (IOC) LEFT VIVITY TORIC LENS 8.75 00:56.7;  Surgeon: Jaye Fallow, MD;  Location: [facility];  Service: Ophthalmology;  Laterality: Left;   CATARACT EXTRACTION W/PHACO Right 01/28/2021   Procedure: CATARACT EXTRACTION PHACO AND INTRAOCULAR LENS PLACEMENT (IOC) RIGHT VIVITY TORIC LENS;  Surgeon: Jaye Fallow, MD;  Location: [facility];  Service: Ophthalmology;  Laterality: Right;  6.54 00:46.4   CHOLECYSTECTOMY  2017   COLONOSCOPY  09/15/2015   Per New Patient Packet   COLONOSCOPY N/A 01/26/2024   Procedure: COLONOSCOPY;  Surgeon: Therisa Bi, MD;  Location: [facility] ENDOSCOPY;  Service: Gastroenterology;  Laterality: N/A;   COLONOSCOPY WITH PROPOFOL  N/A 10/03/2020   Procedure: COLONOSCOPY WITH PROPOFOL ;  Surgeon: Therisa Bi, MD;  Location: [facility] ENDOSCOPY;  Service: Gastroenterology;  Laterality: N/A;   CORONARY ANGIOPLASTY  1998, 2023   CORONARY/GRAFT ACUTE MI REVASCULARIZATION N/A 11/27/2021   Procedure: Coronary/Graft Acute MI Revascularization;  Surgeon: Anner Alm ORN, MD;  Location: [facility] CATH: (NSTEMI) - > 100% prox-mid LCx (Onyx Frontier DES 2.5 x 26 -> 2.7 mm, Ost  OM1 65%.  Ost-mid LAD 3 lesions 75%, 90%, 99% => DES PCI Onyx Frontier DES  2.5 x 34 -> 2.8 mm   ESOPHAGOGASTRODUODENOSCOPY (EGD) WITH PROPOFOL  N/A 10/03/2020   Procedure: ESOPHAGOGASTRODUODENOSCOPY (EGD) WITH PROPOFOL ;  Surgeon: Therisa Bi, MD;  Location: [facility] ENDOSCOPY;  Service: Gastroenterology;  Laterality: N/A;   ESOPHAGOGASTRODUODENOSCOPY (EGD) WITH PROPOFOL  N/A 07/27/2023   Procedure: ESOPHAGOGASTRODUODENOSCOPY (EGD) WITH PROPOFOL ;  Surgeon: Therisa Bi, MD;  Location: [facility] ENDOSCOPY;  Service: Gastroenterology;  Laterality: N/A;   FOOT SURGERY Bilateral 03/18/2020   FOOT SURGERY  08/032021   GALLBLADDER SURGERY  09/15/2015   Gallbladder Removal. Procedure done by Dr.Beverly. Per New Patient Packet   KIDNEY STONE SURGERY  09/14/1980   Too many to count. Per New Patient Packet 09/14/1980-09/15/1995  KIDNEY STONE SURGERY  09/15/1995   Too many to count. Per New Patient Packet   LEFT HEART CATH AND CORONARY ANGIOGRAPHY N/A 11/27/2021   Procedure: LEFT HEART CATH AND CORONARY ANGIOGRAPHY;  Surgeon: Anner Alm ORN, MD;  Location: [facility] CATH:  NSTEMI - 2 V CAD: 100% thrombotic prox-mid LCx (DES PCI), ost OM1 65%; Ost LAD 75% - prox LAD 90%, prox-mid 99% (DES PCI).  MIld diffuse RCA disease - calcicifed.  R-L collaterals filling LCx. EF ~45-50% with lateral HK. LVEDP 28 mmHg   LITHOTRIPSY  09/14/2014   Per New Patient Packet   LITHOTRIPSY  09/14/1996   No Stints Used. Per New Patient Packet   LITHOTRIPSY  09/14/1997   No Stints used. Per New Patient Packet   PAIN PUMP IMPLANTATION     PAIN PUMP REMOVAL     SHOULDER SURGERY Right 1984   SIGMOIDOSCOPY  09/14/2017   Per New Patient Packet   SPINAL CORD STIMULATOR IMPLANT     SPINE SURGERY  1999, 2000   Fusion, Inst. Fusion   TONSILLECTOMY     TRANSTHORACIC ECHOCARDIOGRAM  11/28/2021   (in setting of non-STEMI): EF 50 to 55%.  Suspect inferior and lateral hypokinesis.  Indeterminate diastolic parameters.  Normal RV size and function.  Normal RAP.  Normal aortic and mitral valves with only mild MR and AI.   Normal RVP and RAP.   UPPER GI ENDOSCOPY     VIDEO BRONCHOSCOPY WITH ENDOBRONCHIAL ULTRASOUND N/A 01/15/2023   Procedure: VIDEO BRONCHOSCOPY WITH ENDOBRONCHIAL ULTRASOUND;  Surgeon: Parris Manna, MD;  Location: [facility] OR[facility];  Service: Thoracic;  Laterality: N/A;    Home Medications:  Allergies as of 06/22/2024       Reactions   Ace Inhibitors    Other reaction(s): Cough   Fluoxetine Anxiety   made me fall asleep per pt bad headaches and makes  Me  Crazy historical allergy noted in [employer]   Metoclopramide    Other reaction(s): Other (See Comments), Other (See Comments), Unknown Tardive Dyskinesia  historical allergy noted in [employer]   Nalbuphine    Used Post Back surgery- Anesthesiologist Error. Patient had Narcotic Withdraw. Per New Patient Packet.    Other    Other reaction(s): Other (See Comments) Altered mental status in combo with narcotics at previous hospitalization - Full Withdrawal Symptoms Other reaction(s): Rash   Amoxicillin-pot Clavulanate Nausea Only   Per New Patient Packet.   Doxazosin Rash   Other reaction(s): Other - See Comments, Rash   Duloxetine Nausea Only   Per New Patient Packet.    Penicillins Nausea Only   Per New Patient Packet.   Tamsulosin Itching, Anxiety   Restless, Flushing, Heavy Chest, Itching, Hyperactive mood and Anxiety. Unable to handle side effects. Per New Patient Packet.    Trazodone  And Nefazodone Itching, Anxiety, Rash   Headache. INCREASED MY ANXIETY AND HEARTRATE Flushing, tachycardia INCREASED MY ANXIETY AND HEARTRATE Per New Patient Packet.   Alirocumab  Other (See Comments)   Amlodipine    Shaking, unsure of reaction. Per New Patient Packet.    Cinoxacin    GI Intolerance, and Dizziness. Per New Patient Packet.    Ciprofloxacin    Other reaction(s): Unknown   Fludrocortisone  Other (See Comments)   Worsening headaches, GI issues, fatigue   Nebivolol    Other reaction(s): Unknown   Olanzapine    Headache and  unable to sleep for 3 nights. Per New Patient Packet.   Olmesartan    Other reaction(s): Unknown   Pregabalin  Confusion, Lack of concentration, dizziness, and likely drowsiness. Per New Patient Packet.    Prostaglandins    Other reaction(s): Other (See Comments) Intolerance   Thyroid  Hormones    Other reaction(s): Other (See Comments) Thyroid  (Nature Thyroid ) contraindicated with some of your other medications.   Venlafaxine  Other (See Comments)   High blood pressure- hospitalized  venlafaxine    Venlafaxine  Hcl Er Hypertension   Zolpidem    Nightmares, Ineffective after 2 days. Per New Patient Packet.    Duloxetine Hcl    Other reaction(s): Rash   Fluoxetine Hcl    Other reaction(s): Rash   Nucynta  [tapentadol ] Other (See Comments)   Vertigo    Phenytoin Anxiety   Hyperactivity, and Ineffective. Per New Patient Packet.         Medication List        Accurate as of June 22, 2024 11:59 PM. If you have any questions, ask your nurse or doctor.          STOP taking these medications    traMADol 50 MG tablet Commonly known as: Ultram Stopped by: Angeligue Bowne       TAKE these medications    anastrozole  1 MG tablet Commonly known as: ARIMIDEX  Take 0.5 mg by mouth once a week.   aspirin  EC 81 MG tablet Take 1 tablet (81 mg total) by mouth daily. Swallow whole.   Cholecalciferol  50 MCG (2000 UT) Caps Take 2,000 Units by mouth daily.   cholestyramine  4 g packet Commonly known as: QUESTRAN  Take 4 g by mouth daily as needed (diarrhea).   DHEA 25 MG Caps Take 25 mg by mouth daily.   escitalopram  20 MG tablet Commonly known as: LEXAPRO  TAKE 1 TABLET(20 MG) BY MOUTH DAILY   ezetimibe -simvastatin  10-20 MG tablet Commonly known as: VYTORIN  Take 1 tablet by mouth daily.   Gemtesa  75 MG Tabs Generic drug: Vibegron  Take 1 tablet (75 mg total) by mouth daily.   hydrALAZINE  25 MG tablet Commonly known as: APRESOLINE  Take 2 tabs p.o. in a.m. and q.  afternoon, 1 tab p.o. in the evening   HYDROcodone -acetaminophen  5-325 MG tablet Commonly known as: NORCO/VICODIN Take 1 tablet by mouth every 6 (six) hours as needed for moderate pain (pain score 4-6). Started by: CLOTILDA CORNWALL   Hyoscyamine Sulfate SL 0.125 MG Subl Take 1 tablet by mouth 2 (two) times daily as needed (Abdomal pain).   ipratropium 0.03 % nasal spray Commonly known as: ATROVENT  Place 2 sprays into both nostrils every 12 (twelve) hours. What changed:  when to take this reasons to take this   ketoconazole 2 % shampoo Commonly known as: NIZORAL Apply 1 Application topically daily. With shower   linaclotide  145 MCG Caps capsule Commonly known as: LINZESS  Take 1 capsule (145 mcg total) by mouth daily.   loperamide  2 MG tablet Commonly known as: IMODIUM  A-D Take 2 mg by mouth 4 (four) times daily as needed for diarrhea or loose stools.   MAGNESIUM  BISGLYCINATE PO Take 1 tablet by mouth daily.   meclizine  25 MG tablet Commonly known as: ANTIVERT  Take 1 tablet (25 mg total) by mouth 3 (three) times daily as needed for dizziness.   melatonin 5 MG Tabs Take 5 mg by mouth at bedtime.   Miebo 1.338 GM/ML Soln Generic drug: Perfluorohexyloctane Place 1 drop into both eyes daily as needed (Dry eyes).   NONFORMULARY OR COMPOUNDED ITEM Bi-Mix Papaverine 30mg , Phentolamine 1mg    Dosage: Inject 0.25cc-0.5cc as need for ED   Vial 1ml  Trina KINGS Refills 6   [facility] [phone] Fax [phone] What changed:  when to take this reasons to take this additional instructions   NUTRITIONAL SUPPLEMENT PO Take 1 capsule by mouth daily. [name]   ondansetron  4 MG disintegrating tablet Commonly known as: ZOFRAN -ODT Take 1 tablet (4 mg total) by mouth every 8 (eight) hours as needed for nausea or vomiting.   polyethylene glycol-electrolytes 420 g solution Commonly known as: NuLYTELY  as directed.   PreviDent 5000 Dry Mouth 1.1 %  Gel dental gel Generic drug: sodium fluoride Place 1 Application onto teeth 3 (three) times daily.   PROBIOTIC DAILY PO Take 1 capsule by mouth daily.   propranolol  40 MG tablet Commonly known as: INDERAL  Take 40 mg by mouth 2 (two) times daily.   RABEprazole  20 MG tablet Commonly known as: ACIPHEX  Take 2 tablets (40 mg total) by mouth 2 (two) times daily.   testosterone  cypionate 200 MG/ML injection Commonly known as: DEPOTESTOSTERONE CYPIONATE Inject 80 mg into the muscle every 14 (fourteen) days.   traZODone  50 MG tablet Commonly known as: DESYREL  Take 0.5-1 tablets (25-50 mg total) by mouth at bedtime.   triamcinolone cream 0.1 % Commonly known as: KENALOG as needed (skin irritation, insect bites).   valACYclovir  1000 MG tablet Commonly known as: VALTREX  Take 2 tabs p.o. and repeat in 12 hours as needed for cold sore   Vitamin B-12 5000 MCG Lozg Take 1 lozenge by mouth daily.        Allergies:  Allergies  Allergen Reactions   Ace Inhibitors     Other reaction(s): Cough   Fluoxetine Anxiety    made me fall asleep per pt bad headaches and makes  Me  Crazy historical allergy noted in [employer]   Metoclopramide     Other reaction(s): Other (See Comments), Other (See Comments), Unknown Tardive Dyskinesia  historical allergy noted in [employer]   Nalbuphine     Used Post Back surgery- Anesthesiologist Error. Patient had Narcotic Withdraw. Per New Patient Packet.    Other     Other reaction(s): Other (See Comments) Altered mental status in combo with narcotics at previous hospitalization - Full Withdrawal Symptoms Other reaction(s): Rash   Amoxicillin-Pot Clavulanate Nausea Only    Per New Patient Packet.   Doxazosin Rash    Other reaction(s): Other - See Comments, Rash     Duloxetine Nausea Only    Per New Patient Packet.    Penicillins Nausea Only    Per New Patient Packet.   Tamsulosin Itching and Anxiety    Restless, Flushing, Heavy Chest,  Itching, Hyperactive mood and Anxiety. Unable to handle side effects. Per New Patient Packet.     Trazodone  And Nefazodone Itching, Anxiety and Rash    Headache. INCREASED MY ANXIETY AND HEARTRATE Flushing, tachycardia INCREASED MY ANXIETY AND HEARTRATE Per New Patient Packet.    Alirocumab  Other (See Comments)   Amlodipine     Shaking, unsure of reaction. Per New Patient Packet.     Cinoxacin     GI Intolerance, and Dizziness. Per New Patient Packet.    Ciprofloxacin     Other reaction(s): Unknown   Fludrocortisone  Other (See Comments)    Worsening headaches, GI issues, fatigue   Nebivolol     Other reaction(s): Unknown   Olanzapine     Headache and unable to sleep for 3 nights. Per New Patient Packet.    Olmesartan     Other reaction(s): Unknown   Pregabalin  Confusion, Lack of concentration, dizziness, and likely drowsiness. Per New Patient Packet.     Prostaglandins     Other reaction(s): Other (See Comments) Intolerance   Thyroid  Hormones     Other reaction(s): Other (See Comments) Thyroid  (Nature Thyroid ) contraindicated with some of your other medications.   Venlafaxine  Other (See Comments)    High blood pressure- hospitalized   venlafaxine    Venlafaxine  Hcl Er Hypertension   Zolpidem     Nightmares, Ineffective after 2 days. Per New Patient Packet.    Duloxetine Hcl     Other reaction(s): Rash   Fluoxetine Hcl     Other reaction(s): Rash   Nucynta  [Tapentadol ] Other (See Comments)    Vertigo    Phenytoin Anxiety    Hyperactivity, and Ineffective. Per New Patient Packet.     Family History: Family History  Problem Relation Age of Onset   Heart disease Father    Heart failure Father    Hypertension Father    Stroke Father    Vision loss Father    Stroke Mother    Dementia Mother    Diabetes Mother    Kidney Stones Daughter    Anxiety disorder Daughter    OCD Daughter     Social History:  reports that he has never smoked. He has never  been exposed to tobacco smoke. He has never used smokeless tobacco. He reports current alcohol use. He reports that he does not currently use drugs.  ROS: Pertinent ROS in HPI  Physical Exam: BP (!) 169/79 (BP Location: Left Arm, Patient Position: Sitting, Cuff Size: Normal)   Pulse 62   Ht 5' 9 (1.753 m)   Wt 144 lb (65.3 kg)   SpO2 97%   BMI 21.27 kg/m   Constitutional:  Well nourished. Alert and oriented, No acute distress. HEENT: [place] AT, moist mucus membranes.  Trachea midline Cardiovascular: No clubbing, cyanosis, or edema. Respiratory: Normal respiratory effort, no increased work of breathing. Neurologic: Grossly intact, no focal deficits, moving all 4 extremities. Psychiatric: Normal mood and affect.  Laboratory Data: See HPI and Epic I have reviewed the labs.   Pertinent Imaging: CLINICAL DATA:  Abdominal/flank pain, stone suspected. Left-sided abdominal pain radiating to back.   EXAM: CT ABDOMEN AND PELVIS WITHOUT CONTRAST   TECHNIQUE: Multidetector CT imaging of the abdomen and pelvis was performed following the standard protocol without IV contrast.   RADIATION DOSE REDUCTION: This exam was performed according to the departmental dose-optimization program which includes automated exposure control, adjustment of the mA and/or kV according to patient size and/or use of iterative reconstruction technique.   COMPARISON:  05/30/2024.   FINDINGS: Lower chest: Coronary artery calcification is noted. Atelectasis or scarring is present at the lung bases. There are stable nodular densities at the lung bases.   Hepatobiliary: No focal liver abnormality is seen. Status post cholecystectomy. No biliary dilatation.   Pancreas: Unremarkable. No pancreatic ductal dilatation or surrounding inflammatory changes.   Spleen: Normal in size without focal abnormality.   Adrenals/Urinary Tract: The adrenal glands are within normal limits. Renal calculi are present  bilaterally. There is no hydroureteronephrosis on the right. There is a 3 x 3 mm calculus in the proximal left ureter with mild obstructive uropathy. The bladder is within normal limits.   Stomach/Bowel: The stomach is within normal limits. No bowel obstruction, free air, or pneumatosis is seen. The appendix is not seen.   Vascular/Lymphatic: Aortic atherosclerosis. No enlarged abdominal or pelvic lymph nodes.   Reproductive:  Prostate is unremarkable.   Other: No abdominopelvic ascites. A nerve stimulator device is present in the left flank with leads terminating in the thoracic spinal canal.   Musculoskeletal: Degenerative changes are present in the thoracolumbar spine. Spinal fusion hardware is present from L5-S1. No acute osseous abnormality is seen.   IMPRESSION: 1. Mild obstructive uropathy on the left with a 3 x 3 mm calculus in the proximal left ureter. 2. Bilateral nephrolithiasis. 3. Nodular opacities at the lung bases, not significantly changed from the prior exam, which may be infectious or inflammatory as compared with prior exams. 4. Coronary artery calcification. 5. Aortic atherosclerosis.     Electronically Signed   By: Leita Birmingham M.D.   On: 06/18/2024 14:27 I have independently reviewed the films.  See HPI.   Assessment & Plan:    1. Left ureteral stone - We discussed various treatment options for urolithiasis including observation with or without medical expulsive therapy, shockwave lithotripsy (SWL), ureteroscopy and laser lithotripsy with stent placement and given stone size, stone location and stone density, explained that ESWL has a lower stone-free rate in a single procedure, but also a lower complication rate compared to ureteroscopy, and avoids a stent and associated stent related symptoms. Possible complications include renal hematoma, steinstrasse, and the need for additional treatment, explained that ureteroscopy with laser lithotripsy and stent  placement has a higher stone-free rate than SWL in a single procedure; however, there is an increased complication rate, including possible infection, ureteral injury, bleeding, and stent-related morbidity. Common stent-related symptoms include dysuria, urgency/frequency, and flank pain. - he is wanted to have the procedure with the highest probability of successfully addressing the stone and that would be ureteroscopy - we will go ahead with left ureteroscopy with laser lithotripsy and ureteral stent placement - explained to the patient how the procedure is performed and the risks involved -informed the patient that they will have an ureteral stent, which will remain in place for approximately 3-10 days and can be associated with flank pain, bladder pain, dysuria, urgency, frequency, urinary leakage, and gross hematuria. - stent may be removed in the office with a cystoscope or patient may be instructed to remove the stent themselves by the string and that is decided on the day of the procedure - residual stones within the kidney or ureter may be present after the procedure and may need to have these addressed at a different encounter - injury to the ureter is the most common intra-operative risk, it may result in an open procedure to correct the defect - infection and bleeding are also risks - explained the risks of general anesthesia, such as: MI, CVA, paralysis, coma and/or death. - advised to contact our office or seek treatment in the ED if becomes febrile or pain/ vomiting are difficult control in order to arrange for emergent/urgent intervention - UA bland - urine culture pending - he is given a strainer and instructed to strain his urine and if he should pass the stone, he will contact us , so we can cancel his surgery after we confirm passage of the stone  - discontinue Ultram - use hydrocodone /APAP 5/325, q 4 hours prn for severe pain - encouraged to take ibuprofen 200 mg, q 4 hours for  mild to moderate pain   2. OAB - has follow up with Dr. Francisca next September     Return for Left ureteroscopy with laser lithotripsy with stent placement .  These notes generated with voice recognition software. I apologize  for typographical errors.  CLOTILDA HELON RIGGERS  [facility] [address]  [address] [place], [place] [address] [phone]  Surgical Physician Order [facility]  * Scheduling expectation : 10/13 w/ Dr. Francisca   *Length of Case:   *Clearance needed: no  *Anticoagulation Instructions: May continue all anticoagulants  *Aspirin  Instructions: Ok to continue all  *Post-op visit Date/Instructions:  TBD  *Diagnosis: Left Ureteral Stone  *Procedure: left Ureteroscopy w/laser lithotripsy & stent placement ([id number])   Additional orders: N/A  -Admit type: OUTpatient  -Anesthesia: General  -VTE Prophylaxis Standing Order SCD's       Other:   -Standing Lab Orders Per Anesthesia    Lab other: None  -Standing Test orders EKG/Chest x-ray per Anesthesia       Test other:   - Medications:  Ancef 2gm IV  -Other orders:  N/A

## 2024-06-22 ENCOUNTER — Ambulatory Visit (INDEPENDENT_AMBULATORY_CARE_PROVIDER_SITE_OTHER): Admitting: Urology

## 2024-06-22 ENCOUNTER — Telehealth: Payer: Self-pay

## 2024-06-22 ENCOUNTER — Other Ambulatory Visit: Payer: Self-pay

## 2024-06-22 VITALS — BP 169/79 | HR 62 | Ht 69.0 in | Wt 144.0 lb

## 2024-06-22 DIAGNOSIS — N200 Calculus of kidney: Secondary | ICD-10-CM

## 2024-06-22 DIAGNOSIS — N3281 Overactive bladder: Secondary | ICD-10-CM

## 2024-06-22 DIAGNOSIS — N201 Calculus of ureter: Secondary | ICD-10-CM

## 2024-06-22 DIAGNOSIS — N529 Male erectile dysfunction, unspecified: Secondary | ICD-10-CM

## 2024-06-22 LAB — URINALYSIS, COMPLETE
Bilirubin, UA: NEGATIVE
Glucose, UA: NEGATIVE
Ketones, UA: NEGATIVE
Leukocytes,UA: NEGATIVE
Nitrite, UA: NEGATIVE
Protein,UA: NEGATIVE
RBC, UA: NEGATIVE
Specific Gravity, UA: <1.005 — ABNORMAL LOW (ref 1.005–1.030)
Urobilinogen, Ur: 0.2 mg/dL (ref 0.2–1.0)
pH, UA: 6.5 (ref 5.0–7.5)

## 2024-06-22 LAB — MICROSCOPIC EXAMINATION

## 2024-06-22 MED ORDER — HYDROCODONE-ACETAMINOPHEN 5-325 MG PO TABS: 1.0000 | ORAL_TABLET | Freq: Four times a day (QID) | ORAL | 0 refills | Status: DC | PRN

## 2024-06-22 NOTE — Patient Instructions (Addendum)
 Please call [phone] if you pass your stone  Do not take the tramadol if you are taking the Vicodin, during the day I recommend to take 200 mg of ibuprofen every 4-6 hours for pain

## 2024-06-22 NOTE — Progress Notes (Signed)
 Surgical Physician Order Form [facility]  * Scheduling expectation : 10/13 w/ Dr. Francisca   *Length of Case:   *Clearance needed: no  *Anticoagulation Instructions: May continue all anticoagulants  *Aspirin  Instructions: Ok to continue all  *Post-op visit Date/Instructions:   TBD  *Diagnosis: Left Ureteral Stone  *Procedure: left Ureteroscopy w/laser lithotripsy & stent placement (47643)   Additional orders: N/A  -Admit type: OUTpatient  -Anesthesia: General  -VTE Prophylaxis Standing Order SCD's       Other:   -Standing Lab Orders Per Anesthesia    Lab other: None  -Standing Test orders EKG/Chest x-ray per Anesthesia       Test other:   - Medications:  Ancef 2gm IV  -Other orders:  N/A

## 2024-06-22 NOTE — Progress Notes (Signed)
   [facility] Surgical Posting Form  Surgery Date: Date: 06/26/2024  Surgeon: Dr. Redell Burnet, MD  Inpt ( No  )   Outpt (Yes)   Obs ( No  )   Diagnosis: N20.1 Left Ureteral Stone  -CPT: [phone]  Surgery: Left Ureteroscopy with Laser Lithotripsy and Stent Placement  Stop Anticoagulations: No, may continue all  Cardiac/Medical/Pulmonary Clearance needed: no  *Orders entered into EPIC  Date: 06/22/24   *Case booked in [place]  Date: 06/22/24  *Notified pt of Surgery: Date: 06/22/24  PRE-OP UA & CX: no  *Placed into Prior Authorization Work [place] Date: 06/22/24  Assistant/laser/rep:No

## 2024-06-22 NOTE — Telephone Encounter (Signed)
 Per Dr. Francisca, Patient is to be scheduled for Left Ureteroscopy with Laser Lithotripsy and Stent Placement   Grant Young was contacted and possible surgical dates were discussed, Monday October 13th, 2025 was agreed upon for surgery.   Patient was directed to call [phone] between 1-3pm the day before surgery to find out surgical arrival time.  Instructions were given not to eat or drink from midnight on the night before surgery and have a driver for the day of surgery. On the surgery day patient was instructed to enter through the [facility] entrance of [facility] report the Same Day Surgery desk.   Pre-Admit Testing will be in contact via phone to set up an interview with the anesthesia team to review your history and medications prior to surgery.   Reminder of this information was sent via MyChart to the patient.

## 2024-06-22 NOTE — Telephone Encounter (Signed)
 Spoke with patient, informed him of his pre-op call appt details. Patient verbalized understanding.

## 2024-06-25 ENCOUNTER — Encounter: Payer: Self-pay | Admitting: Urology

## 2024-06-26 ENCOUNTER — Ambulatory Visit: Admitting: Anesthesiology

## 2024-06-26 ENCOUNTER — Encounter
Admission: RE | Disposition: A | Discharge status: Home / Self Care | Payer: Self-pay | Source: Home / Self Care | Attending: Urology

## 2024-06-26 ENCOUNTER — Other Ambulatory Visit: Payer: Self-pay

## 2024-06-26 ENCOUNTER — Ambulatory Visit
Admission: RE | Admit: 2024-06-26 | Discharge: 2024-06-26 | Disposition: A | Discharge status: Home / Self Care | Attending: Urology | Admitting: Urology

## 2024-06-26 ENCOUNTER — Ambulatory Visit

## 2024-06-26 ENCOUNTER — Encounter: Payer: Self-pay | Admitting: Urology

## 2024-06-26 DIAGNOSIS — I129 Hypertensive chronic kidney disease with stage 1 through stage 4 chronic kidney disease, or unspecified chronic kidney disease: Secondary | ICD-10-CM | POA: Insufficient documentation

## 2024-06-26 DIAGNOSIS — N201 Calculus of ureter: Secondary | ICD-10-CM | POA: Diagnosis present

## 2024-06-26 DIAGNOSIS — I252 Old myocardial infarction: Secondary | ICD-10-CM | POA: Diagnosis not present

## 2024-06-26 DIAGNOSIS — Z7982 Long term (current) use of aspirin: Secondary | ICD-10-CM | POA: Diagnosis not present

## 2024-06-26 DIAGNOSIS — K449 Diaphragmatic hernia without obstruction or gangrene: Secondary | ICD-10-CM | POA: Diagnosis not present

## 2024-06-26 DIAGNOSIS — N3281 Overactive bladder: Secondary | ICD-10-CM | POA: Diagnosis not present

## 2024-06-26 DIAGNOSIS — Z79899 Other long term (current) drug therapy: Secondary | ICD-10-CM | POA: Insufficient documentation

## 2024-06-26 DIAGNOSIS — F419 Anxiety disorder, unspecified: Secondary | ICD-10-CM | POA: Diagnosis not present

## 2024-06-26 DIAGNOSIS — G4730 Sleep apnea, unspecified: Secondary | ICD-10-CM | POA: Insufficient documentation

## 2024-06-26 DIAGNOSIS — N189 Chronic kidney disease, unspecified: Secondary | ICD-10-CM | POA: Diagnosis not present

## 2024-06-26 DIAGNOSIS — F32A Depression, unspecified: Secondary | ICD-10-CM | POA: Diagnosis not present

## 2024-06-26 DIAGNOSIS — I2510 Atherosclerotic heart disease of native coronary artery without angina pectoris: Secondary | ICD-10-CM | POA: Insufficient documentation

## 2024-06-26 DIAGNOSIS — N132 Hydronephrosis with renal and ureteral calculous obstruction: Secondary | ICD-10-CM | POA: Diagnosis not present

## 2024-06-26 DIAGNOSIS — Z419 Encounter for procedure for purposes other than remedying health state, unspecified: Secondary | ICD-10-CM

## 2024-06-26 DIAGNOSIS — N202 Calculus of kidney with calculus of ureter: Secondary | ICD-10-CM

## 2024-06-26 DIAGNOSIS — Z955 Presence of coronary angioplasty implant and graft: Secondary | ICD-10-CM | POA: Diagnosis not present

## 2024-06-26 DIAGNOSIS — Z87442 Personal history of urinary calculi: Secondary | ICD-10-CM | POA: Diagnosis not present

## 2024-06-26 DIAGNOSIS — Z7902 Long term (current) use of antithrombotics/antiplatelets: Secondary | ICD-10-CM | POA: Insufficient documentation

## 2024-06-26 DIAGNOSIS — D631 Anemia in chronic kidney disease: Secondary | ICD-10-CM | POA: Diagnosis not present

## 2024-06-26 DIAGNOSIS — K219 Gastro-esophageal reflux disease without esophagitis: Secondary | ICD-10-CM | POA: Insufficient documentation

## 2024-06-26 DIAGNOSIS — Z79811 Long term (current) use of aromatase inhibitors: Secondary | ICD-10-CM | POA: Diagnosis not present

## 2024-06-26 DIAGNOSIS — K279 Peptic ulcer, site unspecified, unspecified as acute or chronic, without hemorrhage or perforation: Secondary | ICD-10-CM | POA: Insufficient documentation

## 2024-06-26 DIAGNOSIS — N139 Obstructive and reflux uropathy, unspecified: Secondary | ICD-10-CM | POA: Diagnosis not present

## 2024-06-26 HISTORY — PX: CYSTOSCOPY/URETEROSCOPY/HOLMIUM LASER/STENT PLACEMENT: SHX6546

## 2024-06-26 SURGERY — CYSTOSCOPY/URETEROSCOPY/HOLMIUM LASER/STENT PLACEMENT
Anesthesia: General | Laterality: Left

## 2024-06-26 MED ORDER — ROCURONIUM BROMIDE 100 MG/10ML IV SOLN
INTRAVENOUS | Status: DC | PRN
  Administered 2024-06-26: 50 mg via INTRAVENOUS

## 2024-06-26 MED ORDER — DEXAMETHASONE SOD PHOSPHATE PF 10 MG/ML IJ SOLN
INTRAMUSCULAR | Status: DC | PRN
  Administered 2024-06-26: 10 mg via INTRAVENOUS

## 2024-06-26 MED ORDER — ONDANSETRON HCL 4 MG/2ML IJ SOLN
INTRAMUSCULAR | Status: DC | PRN
  Administered 2024-06-26: 4 mg via INTRAVENOUS

## 2024-06-26 MED ORDER — CHLORHEXIDINE GLUCONATE 0.12 % MT SOLN
OROMUCOSAL | Status: AC
  Filled 2024-06-26: qty 15

## 2024-06-26 MED ORDER — FENTANYL CITRATE (PF) 100 MCG/2ML IJ SOLN
INTRAMUSCULAR | Status: AC
  Filled 2024-06-26: qty 2

## 2024-06-26 MED ORDER — SUGAMMADEX SODIUM 200 MG/2ML IV SOLN
INTRAVENOUS | Status: DC | PRN
  Administered 2024-06-26: 261.2 mg via INTRAVENOUS

## 2024-06-26 MED ORDER — SEVOFLURANE IN SOLN
RESPIRATORY_TRACT | Status: AC
Start: 2024-06-26 — End: 2024-06-26
  Filled 2024-06-26: qty 250

## 2024-06-26 MED ORDER — HYDROCODONE-ACETAMINOPHEN 5-325 MG PO TABS: 1.0000 | ORAL_TABLET | Freq: Four times a day (QID) | ORAL | 0 refills | Status: AC | PRN

## 2024-06-26 MED ORDER — ACETAMINOPHEN 10 MG/ML IV SOLN
INTRAVENOUS | Status: AC
  Filled 2024-06-26: qty 100

## 2024-06-26 MED ORDER — CEFAZOLIN SODIUM-DEXTROSE 2-4 GM/100ML-% IV SOLN
2.0000 g | INTRAVENOUS | Status: AC
  Administered 2024-06-26: 2 g via INTRAVENOUS

## 2024-06-26 MED ORDER — PROPOFOL 10 MG/ML IV BOLUS
INTRAVENOUS | Status: DC | PRN
  Administered 2024-06-26: 120 mg via INTRAVENOUS

## 2024-06-26 MED ORDER — OXYCODONE HCL 5 MG/5ML PO SOLN: 5.0000 mg | Freq: Once | ORAL | Status: DC | PRN

## 2024-06-26 MED ORDER — FENTANYL CITRATE (PF) 100 MCG/2ML IJ SOLN
INTRAMUSCULAR | Status: DC | PRN
  Administered 2024-06-26: 50 ug via INTRAVENOUS

## 2024-06-26 MED ORDER — SODIUM CHLORIDE 0.9 % IR SOLN
Status: DC | PRN
  Administered 2024-06-26: 1

## 2024-06-26 MED ORDER — ACETAMINOPHEN 10 MG/ML IV SOLN
INTRAVENOUS | Status: DC | PRN
  Administered 2024-06-26: 1000 mg via INTRAVENOUS

## 2024-06-26 MED ORDER — IOHEXOL 180 MG/ML  SOLN
INTRAMUSCULAR | Status: DC | PRN
  Administered 2024-06-26: 10 mL

## 2024-06-26 MED ORDER — LACTATED RINGERS IV SOLN: INTRAVENOUS | Status: DC

## 2024-06-26 MED ORDER — CHLORHEXIDINE GLUCONATE 0.12 % MT SOLN
15.0000 mL | Freq: Once | OROMUCOSAL | Status: AC
  Administered 2024-06-26: 15 mL via OROMUCOSAL

## 2024-06-26 MED ORDER — CEFAZOLIN SODIUM-DEXTROSE 2-4 GM/100ML-% IV SOLN
INTRAVENOUS | Status: AC
  Filled 2024-06-26: qty 100

## 2024-06-26 MED ORDER — HYDRALAZINE HCL 20 MG/ML IJ SOLN
INTRAMUSCULAR | Status: AC
  Filled 2024-06-26: qty 1

## 2024-06-26 MED ORDER — OXYCODONE HCL 5 MG PO TABS: 5.0000 mg | ORAL_TABLET | Freq: Once | ORAL | Status: DC | PRN

## 2024-06-26 MED ORDER — ORAL CARE MOUTH RINSE: 15.0000 mL | Freq: Once | OROMUCOSAL | Status: AC

## 2024-06-26 MED ORDER — MIDAZOLAM HCL 2 MG/2ML IJ SOLN
INTRAMUSCULAR | Status: AC
  Filled 2024-06-26: qty 2

## 2024-06-26 MED ORDER — LIDOCAINE HCL (CARDIAC) PF 100 MG/5ML IV SOSY
PREFILLED_SYRINGE | INTRAVENOUS | Status: DC | PRN
  Administered 2024-06-26: 100 mg via INTRAVENOUS

## 2024-06-26 MED ORDER — HYDRALAZINE HCL 20 MG/ML IJ SOLN
10.0000 mg | Freq: Once | INTRAMUSCULAR | Status: AC
  Administered 2024-06-26: 10 mg via INTRAVENOUS

## 2024-06-26 MED ORDER — MIDAZOLAM HCL 2 MG/2ML IJ SOLN
INTRAMUSCULAR | Status: DC | PRN
  Administered 2024-06-26: 1 mg via INTRAVENOUS

## 2024-06-26 MED ORDER — FENTANYL CITRATE (PF) 100 MCG/2ML IJ SOLN: 25.0000 ug | INTRAMUSCULAR | Status: DC | PRN

## 2024-06-26 MED ORDER — FENTANYL CITRATE (PF) 50 MCG/ML IJ SOSY
50.0000 ug | PREFILLED_SYRINGE | Freq: Once | INTRAMUSCULAR | Status: AC
  Administered 2024-06-26: 50 ug via INTRAVENOUS

## 2024-06-26 MED ORDER — FENTANYL CITRATE (PF) 50 MCG/ML IJ SOSY
PREFILLED_SYRINGE | INTRAMUSCULAR | Status: AC
  Filled 2024-06-26: qty 1

## 2024-06-26 SURGICAL SUPPLY — 23 items
ADHESIVE MASTISOL STRL (MISCELLANEOUS) IMPLANT
BAG DRAIN SIEMENS DORNER NS (MISCELLANEOUS) ×1 IMPLANT
BRUSH SCRUB EZ 4% CHG (MISCELLANEOUS) ×1 IMPLANT
CATH URET FLEX-TIP 2 LUMEN 10F (CATHETERS) IMPLANT
CATH URETL OPEN 5X70 (CATHETERS) IMPLANT
DRAPE UTILITY 15X26 TOWEL STRL (DRAPES) ×1 IMPLANT
DRSG TEGADERM 2-3/8X2-3/4 SM (GAUZE/BANDAGES/DRESSINGS) IMPLANT
FIBER LASER MOSES 200 DFL (Laser) IMPLANT
FIBER LASER MOSES 365 DFL (Laser) IMPLANT
GOWN STRL REUS W/ TWL LRG LVL3 (GOWN DISPOSABLE) ×1 IMPLANT
GOWN STRL REUS W/ TWL XL LVL3 (GOWN DISPOSABLE) ×1 IMPLANT
GUIDEWIRE STR DUAL SENSOR (WIRE) ×1 IMPLANT
KIT TURNOVER CYSTO (KITS) ×1 IMPLANT
PACK CYSTO AR (MISCELLANEOUS) ×1 IMPLANT
SET CYSTO IRRIGATION (SET/KITS/TRAYS/PACK) ×1 IMPLANT
SHEATH NAVIGATOR HD 12/14X36 (SHEATH) IMPLANT
SOL .9 NS 3000ML IRR UROMATIC (IV SOLUTION) ×1 IMPLANT
STENT URET 6FRX24 CONTOUR (STENTS) IMPLANT
STENT URET 6FRX26 CONTOUR (STENTS) IMPLANT
SURGILUBE 2OZ TUBE FLIPTOP (MISCELLANEOUS) ×1 IMPLANT
SYR 10ML LL (SYRINGE) ×1 IMPLANT
VALVE UROSEAL ADJ ENDO (VALVE) IMPLANT
WATER STERILE IRR 500ML POUR (IV SOLUTION) ×1 IMPLANT

## 2024-06-26 NOTE — Interval H&P Note (Signed)
 UROLOGY H&P UPDATE  Agree with prior H&P dated 06/22/2024, 3 mm left proximal ureteral stone, small renal stones, and ongoing renal colic opted for ureteroscopy.  Cardiac: RRR Lungs: CTA bilaterally  Laterality: Left Procedure: Left ureteroscopy, laser lithotripsy, stent placement  Urine: Urine culture no growth  We specifically discussed the risks ureteroscopy including bleeding, infection/sepsis, stent related symptoms including flank pain/urgency/frequency/incontinence/dysuria, ureteral injury, ureteral stricture, inability to access stone, or need for staged or additional procedures.   Grant JAYSON Burnet, MD 06/26/2024

## 2024-06-26 NOTE — Transfer of Care (Signed)
 Immediate Anesthesia Transfer of Care Note  Patient: Grant Young  Procedure(s) Performed: Procedure(s): CYSTOSCOPY/URETEROSCOPY/HOLMIUM LASER/STENT PLACEMENT (Left)  Patient Location: PACU  Anesthesia Type:General  Level of Consciousness: sedated  Airway & Oxygen Therapy: Patient Spontanous Breathing and Patient connected to face mask oxygen  Post-op Assessment: Report given to RN and Post -op Vital signs reviewed and stable  Post vital signs: Reviewed and stable  Last Vitals:  Vitals:   06/26/24 1542 06/26/24 1543  BP:  (!) 198/92  Pulse: 65 65  Resp:  14  Temp: (!) 36.3 C   SpO2:  100%    Complications: No apparent anesthesia complications

## 2024-06-26 NOTE — Anesthesia Procedure Notes (Signed)
 Procedure Name: Intubation Date/Time: 06/26/2024 3:04 PM  Performed by: Tod Handing, CRNAPre-anesthesia Checklist: Patient identified, Patient being monitored, Timeout performed, Emergency Drugs available and Suction available Patient Re-evaluated:Patient Re-evaluated prior to induction Oxygen Delivery Method: Circle system utilized Preoxygenation: Pre-oxygenation with 100% oxygen Induction Type: IV induction Ventilation: Mask ventilation without difficulty Laryngoscope Size: Mac, 4 and McGrath Grade View: Grade I Tube type: Oral Tube size: 7.0 mm Number of attempts: 1 Airway Equipment and Method: Stylet Placement Confirmation: ETT inserted through vocal cords under direct vision, positive ETCO2 and breath sounds checked- equal and bilateral Secured at: 21 cm Tube secured with: Tape Dental Injury: Teeth and Oropharynx as per pre-operative assessment

## 2024-06-26 NOTE — Anesthesia Preprocedure Evaluation (Addendum)
 Anesthesia Evaluation  Patient identified by MRN, date of birth, ID band Patient awake    Reviewed: Allergy & Precautions, NPO status , Patient's Chart, lab work & pertinent test results  History of Anesthesia Complications (+) PONV and history of anesthetic complications  Airway Mallampati: III  TM Distance: >3 FB Neck ROM: full    Dental  (+) Chipped   Pulmonary sleep apnea and Continuous Positive Airway Pressure Ventilation    Pulmonary exam normal        Cardiovascular hypertension, + CAD, + Past MI and + Cardiac Stents  Normal cardiovascular exam     Neuro/Psych  PSYCHIATRIC DISORDERS Anxiety Depression     Neuromuscular disease    GI/Hepatic Neg liver ROS, hiatal hernia, PUD,GERD  ,,  Endo/Other  negative endocrine ROS    Renal/GU CRFRenal disease  negative genitourinary   Musculoskeletal   Abdominal   Peds  Hematology  (+) Blood dyscrasia, anemia   Anesthesia Other Findings Past Medical History: No date: Allergy No date: Anemia No date: Aneurysm of ascending aorta     Comment:  a.) CT chest 08/18/2022: 4.1 cm; b.) CT chest               12/07/2022: 4.2 cm No date: Anxiety     Comment:  a.) on BZO (diazepam ) PRN No date: Aortic atherosclerosis 2015: Cataract No date: Chronic back pain 1980's: Chronic kidney disease     Comment:  Kidney Stones 01-2022: Clotting disorder     Comment:  Plavix  No date: Coronary artery disease     Comment:  a.) s/p PCI with DES x 2 (pLCx and o-mLAD) 11/27/2021 No date: Depression No date: Erectile dysfunction     Comment:  a.) Bi-mix (papaverine + phentolamine) injections +               exogenous testosterone  injections No date: GERD (gastroesophageal reflux disease) No date: h/o Lyme disease No date: Headache No date: Hiatal hernia 01/2021: History of bilateral cataract extraction No date: History of gastritis No date: History of kidney stones No date: History  of neuropathy No date: Hyperlipidemia LDL goal <70     Comment:  a.) CAD with NSTEMI -->  intolerant of statins with               statin myopathy and memory issues.;  Also intolerant of               Repatha  No date: Hypertension     Comment:  Labile blood pressures but dizziness with blood               pressures in the normal range; intolerant of most               medications including ARB's, ACE inhibitor's, amlodipine               most beta-blockers other than propranolol . No date: Insomnia     Comment:  a.) takes malatonin PRN No date: Left lower lobe pulmonary nodule     Comment:  a.) chest CT 12/07/2022: nodules x 2 posteromedial LLL;               8 x 11 mm and 8 x 10 mm No date: Long term current use of antithrombotics/antiplatelets     Comment:  a.) DAPT (ASA + clopidogrel ) No date: Long term current use of aromatase inhibitor     Comment:  a,) anastrozole  --> estridiol suppression secondary to  exogenous testosterone  use No date: Lumbar radicular pain No date: Myocardial infarction [facility])     Comment:  a.) MI x 2 - 1990 * 1999 - PTCA 11/27/2021: NSTEMI (non-ST elevated myocardial infarction) [facility])     Comment:  a.) LHC 11/28/2021: sequential 75% o-pLAD, 70% OM2, 80%               p-mLAD, 99% mLAD, 100% pLCx --> PCI placing a 2.5 x 26 mm              Onyx Frontier DES to pLCx and a 2.5 x 24 mm Onyx Frontier              DES to the o-m LAD (covering 3 lesions) No date: Overactive bladder No date: Pneumonia No date: PONV (postoperative nausea and vomiting) No date: Recurrent herpes labialis     Comment:  a.) has suppressive valacyclovir  to use PRN No date: Skin cancer, basal cell No date: Spinal cord stimulator status     Comment:  01/08/21 - not currently using. No date: Squamous cell skin cancer No date: Substance abuse (HCC) 2022: Syncope and collapse  Past Surgical History: No date: BACK SURGERY No date: BASAL CELL CARCINOMA EXCISION 07/27/2023:  BIOPSY     Comment:  Procedure: BIOPSY;  Surgeon: Therisa Bi, MD;  Location:              [facility] ENDOSCOPY;  Service: Gastroenterology;; 01/14/2021: CATARACT EXTRACTION W/PHACO; Left     Comment:  Procedure: CATARACT EXTRACTION PHACO AND INTRAOCULAR               LENS PLACEMENT (IOC) LEFT VIVITY TORIC LENS 8.75 00:56.7;              Surgeon: Jaye Fallow, MD;  Location: [facility]              [facility];  Service: Ophthalmology;  Laterality: Left; 01/28/2021: CATARACT EXTRACTION W/PHACO; Right     Comment:  Procedure: CATARACT EXTRACTION PHACO AND INTRAOCULAR               LENS PLACEMENT (IOC) RIGHT VIVITY TORIC LENS;  Surgeon:               Jaye Fallow, MD;  Location: [facility];                Service: Ophthalmology;  Laterality: Right;                6.54 00:46.4 2017: CHOLECYSTECTOMY 09/15/2015: COLONOSCOPY     Comment:  Per New Patient Packet 01/26/2024: COLONOSCOPY; N/A     Comment:  Procedure: COLONOSCOPY;  Surgeon: Therisa Bi, MD;                Location: [facility] ENDOSCOPY;  Service: Gastroenterology;                Laterality: N/A; 10/03/2020: COLONOSCOPY WITH PROPOFOL ; N/A     Comment:  Procedure: COLONOSCOPY WITH PROPOFOL ;  Surgeon: Therisa Bi, MD;  Location: [facility] ENDOSCOPY;  Service:               Gastroenterology;  Laterality: N/A; 1998, 2023: CORONARY ANGIOPLASTY 11/27/2021: CORONARY/GRAFT ACUTE MI REVASCULARIZATION; N/A     Comment:  Procedure: Coronary/Graft Acute MI Revascularization;                Surgeon: Anner Alm ORN, MD;  Location: [facility] CATH:               (  NSTEMI) - > 100% prox-mid LCx (Onyx Frontier DES 2.5 x               26 -> 2.7 mm, Ost  OM1 65%.  Ost-mid LAD 3 lesions 75%,               90%, 99% => DES PCI Onyx Frontier DES 2.5 x 34 -> 2.8 mm 10/03/2020: ESOPHAGOGASTRODUODENOSCOPY (EGD) WITH PROPOFOL ; N/A     Comment:  Procedure: ESOPHAGOGASTRODUODENOSCOPY (EGD) WITH               PROPOFOL ;  Surgeon: Therisa Bi, MD;   Location: [facility]               ENDOSCOPY;  Service: Gastroenterology;  Laterality: N/A; 07/27/2023: ESOPHAGOGASTRODUODENOSCOPY (EGD) WITH PROPOFOL ; N/A     Comment:  Procedure: ESOPHAGOGASTRODUODENOSCOPY (EGD) WITH               PROPOFOL ;  Surgeon: Therisa Bi, MD;  Location: [facility]               ENDOSCOPY;  Service: Gastroenterology;  Laterality: N/A; 03/18/2020: FOOT SURGERY; Bilateral 08/032021: FOOT SURGERY 09/15/2015: GALLBLADDER SURGERY     Comment:  Gallbladder Removal. Procedure done by Dr.Beverly. Per               New Patient Packet 09/14/1980: KIDNEY STONE SURGERY     Comment:  Too many to count. Per New Patient Packet               09/14/1980-09/15/1995 09/15/1995: KIDNEY STONE SURGERY     Comment:  Too many to count. Per New Patient Packet 11/27/2021: LEFT HEART CATH AND CORONARY ANGIOGRAPHY; N/A     Comment:  Procedure: LEFT HEART CATH AND CORONARY ANGIOGRAPHY;                Surgeon: Anner Alm ORN, MD;  Location: [facility] CATH:                NSTEMI - 2 V CAD: 100% thrombotic prox-mid LCx (DES PCI),              ost OM1 65%; Ost LAD 75% - prox LAD 90%, prox-mid 99%               (DES PCI).  MIld diffuse RCA disease - calcicifed.  R-L               collaterals filling LCx. EF ~45-50% with lateral HK.               LVEDP 28 mmHg 09/14/2014: LITHOTRIPSY     Comment:  Per New Patient Packet 09/14/1996: LITHOTRIPSY     Comment:  No Stints Used. Per New Patient Packet 09/14/1997: LITHOTRIPSY     Comment:  No Stints used. Per New Patient Packet No date: PAIN PUMP IMPLANTATION No date: PAIN PUMP REMOVAL 1984: SHOULDER SURGERY; Right 09/14/2017: SIGMOIDOSCOPY     Comment:  Per New Patient Packet No date: SPINAL CORD STIMULATOR IMPLANT 1999, 2000: SPINE SURGERY     Comment:  Fusion, Inst. Fusion No date: TONSILLECTOMY 11/28/2021: TRANSTHORACIC ECHOCARDIOGRAM     Comment:  (in setting of non-STEMI): EF 50 to 55%.  Suspect               inferior and lateral hypokinesis.   Indeterminate               diastolic parameters.  Normal RV size and function.  Normal RAP.  Normal aortic and mitral valves with only               mild MR and AI.  Normal RVP and RAP. No date: UPPER GI ENDOSCOPY 01/15/2023: VIDEO BRONCHOSCOPY WITH ENDOBRONCHIAL ULTRASOUND; N/A     Comment:  Procedure: VIDEO BRONCHOSCOPY WITH ENDOBRONCHIAL               ULTRASOUND;  Surgeon: Parris Manna, MD;  Location:               [facility] ORS;  Service: Thoracic;  Laterality: N/A;     Reproductive/Obstetrics negative OB ROS                              Anesthesia Physical Anesthesia Plan  ASA: 3  Anesthesia Plan: General   Post-op Pain Management: Ofirmev  IV (intra-op)*   Induction: Intravenous  PONV Risk Score and Plan: 3 and Propofol  infusion, TIVA, Dexamethasone , Ondansetron  and Treatment may vary due to age or medical condition  Airway Management Planned: Oral ETT  Additional Equipment:   Intra-op Plan:   Post-operative Plan: Extubation in OR  Informed Consent: I have reviewed the patients History and Physical, chart, labs and discussed the procedure including the risks, benefits and alternatives for the proposed anesthesia with the patient or authorized representative who has indicated his/her understanding and acceptance.     Dental Advisory Given  Plan Discussed with: Anesthesiologist, CRNA and Surgeon  Anesthesia Plan Comments: (Patient consented for risks of anesthesia including but not limited to:  - adverse reactions to medications - damage to eyes, teeth, lips or other oral mucosa - nerve damage due to positioning  - sore throat or hoarseness - Damage to heart, brain, nerves, lungs, other parts of body or loss of life  Patient voiced understanding and assent.)         Anesthesia Quick Evaluation

## 2024-06-26 NOTE — Op Note (Signed)
 Date of procedure: 06/26/24  Preoperative diagnosis:  Left ureteral stone Left renal stones  Postoperative diagnosis:  Same  Procedure: Cystoscopy, left retrograde pyelogram with intraoperative interpretation, left ureteral stent placement Left ureteroscopy, laser lithotripsy of renal stones  Surgeon: Redell Burnet, MD  Anesthesia: General  Complications: None  Intraoperative findings:  Left ureteral stone had passed into the bladder and was evacuated free Small prostate with open prostatic fossa, normal bladder Uncomplicated dusting of left renal stones and stent placement  EBL: None  Specimens: None  Drains: Left 6 French by 26 cm ureteral stent  Indication: Grant Young is a 78 y.o. patient with 4 mm left proximal ureteral stone, multiple small renal stones, and poorly controlled renal colic who opted for ureteroscopy.  After reviewing the management options for treatment, they elected to proceed with the above surgical procedure(s). We have discussed the potential benefits and risks of the procedure, side effects of the proposed treatment, the likelihood of the patient achieving the goals of the procedure, and any potential problems that might occur during the procedure or recuperation. Informed consent has been obtained.  Description of procedure:  The patient was taken to the operating room and general anesthesia was induced. SCDs were placed for DVT prophylaxis. The patient was placed in the dorsal lithotomy position, prepped and draped in the usual sterile fashion, and preoperative antibiotics were administered. A preoperative time-out was performed.   A 21 French rigid cystoscope was used to intubate the urethra and a normal-appearing urethra is followed proximally in the bladder.  The prostate was small with an open prostatic fossa.  Thorough cystoscopy showed no suspicious lesions and mild trabeculations, there was a 4 mm stone at the base of the bladder that likely  represented the left ureteral stone and this was evacuated free out of the bladder through the scope.  A sensor wire was passed into the left ureteral orifice and advanced up to the kidney under fluoroscopic vision.  A semirigid long ureteroscope was advanced alongside the wire and no stones were identified in the distal, mid, or proximal ureter.  A digital single-channel flexible ureteroscope was then advanced over the wire up to the kidney under fluoroscopic vision.  Thorough pyeloscopy revealed a few small stones in the midpole, and a larger 7 mm stone in the lower pole.  A 200 m laser fiber and settings of 0.5 J and 80 Hz was used to methodically dust all stones.  There are pyeloscopy showed no other stone fragments larger than the laser fiber.  A retrograde pyelogram was performed from the proximal ureter and showed no extravasation or filling defects.  Careful pullback ureteroscopy showed no residual stones or ureteral injury.  A wire was replaced through the scope into the kidney.  A 21 French rigid cystoscope was backloaded over the wire and a 6 [place] by 26 cm ureteral stent was placed uneventfully with a curl in the kidney as well as in the bladder under direct vision.  Fluid drained through the side ports of the stent.  The bladder was drained and this concluded our procedure.  Disposition: Stable to PACU  Plan: Schedule stent removal in 1 week  Redell Burnet, MD

## 2024-06-27 ENCOUNTER — Encounter: Payer: Self-pay | Admitting: Urology

## 2024-06-28 LAB — CULTURE, URINE COMPREHENSIVE

## 2024-07-01 NOTE — Anesthesia Postprocedure Evaluation (Signed)
 Anesthesia Post Note  Patient: Grant Young  Procedure(s) Performed: CYSTOSCOPY/URETEROSCOPY/HOLMIUM LASER/STENT PLACEMENT (Left)  Patient location during evaluation: PACU Anesthesia Type: General Level of consciousness: awake and alert Pain management: pain level controlled Vital Signs Assessment: post-procedure vital signs reviewed and stable Respiratory status: spontaneous breathing, nonlabored ventilation, respiratory function stable and patient connected to nasal cannula oxygen Cardiovascular status: blood pressure returned to baseline and stable Postop Assessment: no apparent nausea or vomiting Anesthetic complications: no   No notable events documented.   Last Vitals:  Vitals:   06/26/24 1636 06/26/24 1657  BP: (!) 192/79 (!) 187/79  Pulse: (!) 55 (!) 58  Resp: 10 15  Temp: 37 C 36.9 C  SpO2: 97% 99%    Last Pain:  Vitals:   06/26/24 1657  TempSrc: Temporal  PainSc: 0-No pain                 Prentice Murphy

## 2024-07-03 ENCOUNTER — Ambulatory Visit (INDEPENDENT_AMBULATORY_CARE_PROVIDER_SITE_OTHER): Payer: Self-pay | Admitting: Gastroenterology

## 2024-07-04 ENCOUNTER — Other Ambulatory Visit: Payer: Self-pay

## 2024-07-04 ENCOUNTER — Ambulatory Visit (INDEPENDENT_AMBULATORY_CARE_PROVIDER_SITE_OTHER): Admitting: Urology

## 2024-07-04 VITALS — BP 145/75 | HR 63 | Wt 147.4 lb

## 2024-07-04 DIAGNOSIS — Z466 Encounter for fitting and adjustment of urinary device: Secondary | ICD-10-CM | POA: Diagnosis not present

## 2024-07-04 DIAGNOSIS — N200 Calculus of kidney: Secondary | ICD-10-CM

## 2024-07-04 MED ORDER — LIDOCAINE HCL URETHRAL/MUCOSAL 2 % EX GEL
1.0000 | Freq: Once | CUTANEOUS | Status: AC
  Administered 2024-07-04: 1 via URETHRAL

## 2024-07-04 NOTE — Progress Notes (Signed)
 Cystoscopy Procedure Note:  Indication: Stent removal s/p 06/26/2024 left ureteroscopy, laser lithotripsy, stent placement  After informed consent and discussion of the procedure and its risks, Grant Young was positioned and prepped in the standard fashion. Cystoscopy was performed with a flexible cystoscope. The stent was grasped with flexible graspers and removed in its entirety. The patient tolerated the procedure well.  Findings: Uncomplicated stent removal  Assessment and Plan: Keep follow-up in September 2026 for other urologic issues as scheduled  [name], MD 07/04/2024

## 2024-07-07 ENCOUNTER — Ambulatory Visit
Admission: RE | Admit: 2024-07-07 | Discharge: 2024-07-07 | Disposition: A | Discharge status: Home / Self Care | Source: Ambulatory Visit | Attending: Emergency Medicine | Admitting: Emergency Medicine

## 2024-07-07 VITALS — BP 108/67 | HR 80 | Temp 99.7°F | Resp 16

## 2024-07-07 DIAGNOSIS — B349 Viral infection, unspecified: Secondary | ICD-10-CM | POA: Insufficient documentation

## 2024-07-07 DIAGNOSIS — R350 Frequency of micturition: Secondary | ICD-10-CM | POA: Diagnosis present

## 2024-07-07 DIAGNOSIS — N3001 Acute cystitis with hematuria: Secondary | ICD-10-CM | POA: Diagnosis not present

## 2024-07-07 LAB — POCT URINE DIPSTICK
Bilirubin, UA: NEGATIVE
Blood, UA: NEGATIVE
Glucose, UA: NEGATIVE mg/dL
Nitrite, UA: POSITIVE — AB
POC PROTEIN,UA: 30 — AB
Spec Grav, UA: 1.02 (ref 1.010–1.025)
Urobilinogen, UA: 0.2 U/dL
pH, UA: 5.5 (ref 5.0–8.0)

## 2024-07-07 LAB — POC COVID19/FLU A&B COMBO
Covid Antigen, POC: NEGATIVE
Influenza A Antigen, POC: NEGATIVE
Influenza B Antigen, POC: NEGATIVE

## 2024-07-07 MED ORDER — BENZONATATE 100 MG PO CAPS: 100.0000 mg | ORAL_CAPSULE | Freq: Three times a day (TID) | ORAL | 0 refills | Status: DC

## 2024-07-07 MED ORDER — NITROFURANTOIN MONOHYD MACRO 100 MG PO CAPS: 100.0000 mg | ORAL_CAPSULE | Freq: Two times a day (BID) | ORAL | 0 refills | Status: AC

## 2024-07-07 MED ORDER — GUAIFENESIN-CODEINE 100-10 MG/5ML PO SOLN: 5.0000 mL | Freq: Four times a day (QID) | ORAL | 0 refills | Status: DC | PRN

## 2024-07-07 NOTE — Discharge Instructions (Addendum)
 Your symptoms today are most likely being caused by a virus and should steadily improve in time it can take up to 7 to 10 days before you truly start to see a turnaround however things will get better  COVID and flu testing negative  You may use Tessalon  pill every 8 hours as needed for cough, may use cough every 6 hours or just at bedtime as needed to further assist with coughing    You can take Tylenol  and/or Ibuprofen as needed for fever reduction and pain relief.   For cough: honey 1/2 to 1 teaspoon (you can dilute the honey in water or another fluid).  You can also use guaifenesin  and dextromethorphan  for cough. You can use a humidifier for chest congestion and cough.  If you don't have a humidifier, you can sit in the bathroom with the hot shower running.      For sore throat: try warm salt water gargles, cepacol lozenges, throat spray, warm tea or water with lemon/honey, popsicles or ice, or OTC cold relief medicine for throat discomfort.   For congestion: take a daily anti-histamine like Zyrtec, Claritin , and a oral decongestant, such as pseudoephedrine.  You can also use Flonase 1-2 sprays in each nostril daily.   It is important to stay hydrated: drink plenty of fluids (water, gatorade/powerade/pedialyte, juices, or teas) to keep your throat moisturized and help further relieve irritation/discomfort.  Your urinalysis shows Kam Rahimi blood cells and nitrates which are indicative of infection, your urine will be sent to the lab to determine exactly which bacteria is present, if any changes need to be made to your medications you will be notified  Begin use of Macrobid twice daily for 7 days  You may use over-the-counter Azo to help minimize your symptoms until antibiotic removes bacteria, this medication will turn your urine orange  Increase your fluid intake through use of water  If symptoms continue to persist after use of medication or recur please follow-up with urgent care or your  primary doctor as needed

## 2024-07-07 NOTE — ED Provider Notes (Signed)
 [facility]    CSN: [id number] Arrival date & time: 07/07/24  1419      History   Chief Complaint Chief Complaint  Patient presents with   Fever    Exhausted headache - Entered by patient   Headache   Nausea   Diarrhea   Urinary Frequency   Generalized Body Aches    HPI Grant Young is a 78 y.o. male.   Patient presents for evaluation of chills, body aches, headache, nasal congestion, nonproductive cough, sinus pressure behind the eyes and nausea without vomiting beginning 2 days ago.  Associated diarrhea.  Fever peaking at 101.9, has taken Tylenol  for management.  No known sick contacts prior.  Tolerable to food and liquids but appetite is decreased.  Denies shortness of breath or wheezing.  Patient concerned with urinary frequency, dysuria, hematuria and right sided low back pain beginning 3 days ago after removal of stent placed for kidney stone.  Has attempted use of Azo.  Denies abdominal pain.  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   Kidney Stones   Clotting disorder 01-2022   Plavix    Coronary artery disease    a.) s/p PCI with DES x 2 (pLCx and o-mLAD) 11/27/2021   Depression    Erectile dysfunction    a.) Bi-mix (papaverine + phentolamine) injections + exogenous testosterone  injections   GERD (gastroesophageal reflux disease)    h/o Lyme disease    Headache    Hiatal hernia    History of bilateral cataract extraction 01/2021   History of gastritis    History of kidney stones    History of neuropathy    Hyperlipidemia LDL goal <70    a.) CAD with NSTEMI -->  intolerant of statins with statin myopathy and memory issues.;  Also intolerant of Repatha    Hypertension    Labile blood pressures  but dizziness with blood pressures in the normal range; intolerant of most medications including ARB's, ACE inhibitor's, amlodipine most beta-blockers other than propranolol .   Insomnia    a.) takes malatonin PRN   Left lower lobe pulmonary nodule    a.) chest CT 12/07/2022: nodules x 2 posteromedial LLL;  8 x 11 mm and 8 x 10 mm   Long term current use of antithrombotics/antiplatelets    a.) DAPT (ASA + clopidogrel )   Long term current use of aromatase inhibitor    a,) anastrozole  --> estridiol suppression secondary to exogenous testosterone  use   Lumbar radicular pain    Myocardial infarction (HCC)    a.) MI x 2 - 1990 * 1999 - PTCA   NSTEMI (non-ST elevated myocardial infarction) (HCC) 11/27/2021   a.) LHC 11/28/2021: sequential 75% o-pLAD, 70% OM2, 80% p-mLAD, 99% mLAD, 100% pLCx --> PCI placing a 2.5 x 26 mm Onyx Frontier DES to pLCx and a 2.5 x 24 mm Onyx Frontier DES to the o-m LAD (covering 3 lesions)   Overactive bladder    Pneumonia    PONV (postoperative nausea and vomiting)    Recurrent herpes labialis    a.) has suppressive valacyclovir  to use PRN   Skin cancer, basal cell  Spinal cord stimulator status    01/08/21 - not currently using.   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 06/14/2024   Encounter for screening colonoscopy 06/14/2024   Right lower quadrant abdominal pain 06/06/2024   OSA (obstructive sleep apnea) 04/30/2024   Chronic right shoulder pain 11/02/2023   History of cold sores 11/20/2022   Coronary artery disease involving native coronary artery of native heart without angina pectoris 08/18/2022   Chronic migraine without aura 11/28/2021   Orthostasis 09/01/2021   OAB (overactive bladder) 12/03/2020   GERD (gastroesophageal  reflux disease) 10/24/2020   Hypotestosteronism 10/24/2020   IBS (irritable bowel syndrome) 10/24/2020   Lyme disease 10/24/2020   Lumbar spondylosis 12/19/2019   Chronic pain syndrome 12/19/2019   Tremor 09/27/2019   Labile hypertension 09/21/2019   Insomnia 10/24/2002   Anxiety and depression 12/21/2001   Hyperlipidemia LDL goal <70 09/30/2001   Peripheral polyneuropathy 09/15/1999    Past Surgical History:  Procedure Laterality Date   BACK SURGERY     BASAL CELL CARCINOMA EXCISION     BIOPSY  07/27/2023   Procedure: BIOPSY;  Surgeon: Therisa Bi, MD;  Location: [facility] ENDOSCOPY;  Service: Gastroenterology;;   CATARACT EXTRACTION W/PHACO Left 01/14/2021   Procedure: CATARACT EXTRACTION PHACO AND INTRAOCULAR LENS PLACEMENT (IOC) LEFT VIVITY TORIC LENS 8.75 00:56.7;  Surgeon: Jaye Fallow, MD;  Location: [facility];  Service: Ophthalmology;  Laterality: Left;   CATARACT EXTRACTION W/PHACO Right 01/28/2021   Procedure: CATARACT EXTRACTION PHACO AND INTRAOCULAR LENS PLACEMENT (IOC) RIGHT VIVITY TORIC LENS;  Surgeon: Jaye Fallow, MD;  Location: [facility];  Service: Ophthalmology;  Laterality: Right;  6.54 00:46.4   CHOLECYSTECTOMY  2017   COLONOSCOPY  09/15/2015   Per New Patient Packet   COLONOSCOPY N/A 01/26/2024   Procedure: COLONOSCOPY;  Surgeon: Therisa Bi, MD;  Location: [facility] ENDOSCOPY;  Service: Gastroenterology;  Laterality: N/A;   COLONOSCOPY WITH PROPOFOL  N/A 10/03/2020   Procedure: COLONOSCOPY WITH PROPOFOL ;  Surgeon: Therisa Bi, MD;  Location: [facility] ENDOSCOPY;  Service: Gastroenterology;  Laterality: N/A;   CORONARY ANGIOPLASTY  1998, 2023   CORONARY/GRAFT ACUTE MI REVASCULARIZATION N/A 11/27/2021   Procedure: Coronary/Graft Acute MI Revascularization;  Surgeon: Anner Alm ORN, MD;  Location: [facility] CATH: (NSTEMI) - > 100% prox-mid LCx (Onyx Frontier DES 2.5 x 26 -> 2.7 mm, Ost  OM1 65%.  Ost-mid LAD 3 lesions 75%, 90%, 99% => DES PCI Onyx  Frontier DES 2.5 x 34 -> 2.8 mm   CYSTOSCOPY/URETEROSCOPY/HOLMIUM LASER/STENT PLACEMENT Left 06/26/2024   Procedure: CYSTOSCOPY/URETEROSCOPY/HOLMIUM LASER/STENT PLACEMENT;  Surgeon: Francisca Redell BROCKS, MD;  Location: [facility] ORS;  Service: Urology;  Laterality: Left;   ESOPHAGOGASTRODUODENOSCOPY (EGD) WITH PROPOFOL  N/A 10/03/2020   Procedure: ESOPHAGOGASTRODUODENOSCOPY (EGD) WITH PROPOFOL ;  Surgeon: Therisa Bi, MD;  Location: [facility] ENDOSCOPY;  Service: Gastroenterology;  Laterality: N/A;   ESOPHAGOGASTRODUODENOSCOPY (EGD) WITH PROPOFOL  N/A 07/27/2023   Procedure: ESOPHAGOGASTRODUODENOSCOPY (EGD) WITH PROPOFOL ;  Surgeon: Therisa Bi, MD;  Location: [facility] ENDOSCOPY;  Service: Gastroenterology;  Laterality: N/A;   FOOT SURGERY Bilateral 03/18/2020   FOOT SURGERY  08/032021   GALLBLADDER SURGERY  09/15/2015   Gallbladder Removal. Procedure done by Dr.Beverly. Per New Patient Packet   KIDNEY STONE SURGERY  09/14/1980   Too many to count. Per New Patient  Packet 09/14/1980-09/15/1995   KIDNEY STONE SURGERY  09/15/1995   Too many to count. Per New Patient Packet   LEFT HEART CATH AND CORONARY ANGIOGRAPHY N/A 11/27/2021   Procedure: LEFT HEART CATH AND CORONARY ANGIOGRAPHY;  Surgeon: Anner Alm ORN, MD;  Location: [facility] CATH:  NSTEMI - 2 V CAD: 100% thrombotic prox-mid LCx (DES PCI), ost OM1 65%; Ost LAD 75% - prox LAD 90%, prox-mid 99% (DES PCI).  MIld diffuse RCA disease - calcicifed.  R-L collaterals filling LCx. EF ~45-50% with lateral HK. LVEDP 28 mmHg   LITHOTRIPSY  09/14/2014   Per New Patient Packet   LITHOTRIPSY  09/14/1996   No Stints Used. Per New Patient Packet   LITHOTRIPSY  09/14/1997   No Stints used. Per New Patient Packet   PAIN PUMP IMPLANTATION     PAIN PUMP REMOVAL     SHOULDER SURGERY Right 1984   SIGMOIDOSCOPY  09/14/2017   Per New Patient Packet   SPINAL CORD STIMULATOR IMPLANT     SPINE SURGERY  1999, 2000   Fusion, Inst. Fusion   TONSILLECTOMY     TRANSTHORACIC  ECHOCARDIOGRAM  11/28/2021   (in setting of non-STEMI): EF 50 to 55%.  Suspect inferior and lateral hypokinesis.  Indeterminate diastolic parameters.  Normal RV size and function.  Normal RAP.  Normal aortic and mitral valves with only mild MR and AI.  Normal RVP and RAP.   UPPER GI ENDOSCOPY     VIDEO BRONCHOSCOPY WITH ENDOBRONCHIAL ULTRASOUND N/A 01/15/2023   Procedure: VIDEO BRONCHOSCOPY WITH ENDOBRONCHIAL ULTRASOUND;  Surgeon: Parris Manna, MD;  Location: [facility] ORS;  Service: Thoracic;  Laterality: N/A;       Home Medications    Prior to Admission medications   Medication Sig Start Date End Date Taking? Authorizing Provider  benzonatate  (TESSALON ) 100 MG capsule Take 1 capsule (100 mg total) by mouth every 8 (eight) hours. 07/07/24  Yes Karter Haire R, NP  guaiFENesin -codeine 100-10 MG/5ML syrup Take 5 mLs by mouth every 6 (six) hours as needed for cough. 07/07/24  Yes Jamonte Curfman R, NP  nitrofurantoin, macrocrystal-monohydrate, (MACROBID) 100 MG capsule Take 1 capsule (100 mg total) by mouth 2 (two) times daily for 7 days. 07/07/24 07/14/24 Yes Brison Fiumara R, NP  anastrozole  (ARIMIDEX ) 1 MG tablet Take 0.5 mg by mouth once a week. 05/07/21   [provider]  aspirin  EC 81 MG tablet Take 1 tablet (81 mg total) by mouth daily. Swallow whole. 09/16/23   Anner Alm ORN, MD  Cholecalciferol  50 MCG (2000 UT) CAPS Take 2,000 Units by mouth daily. 04/15/20   [provider]  cholestyramine  (QUESTRAN ) 4 g packet Take 4 g by mouth daily as needed (diarrhea). 10/16/20   [provider]  Cyanocobalamin  (VITAMIN B-12) 5000 MCG LOZG Take 1 lozenge by mouth daily.     [provider]  DHEA 25 MG CAPS Take 25 mg by mouth daily.    [provider]  escitalopram  (LEXAPRO ) 20 MG tablet TAKE 1 TABLET(20 MG) BY MOUTH DAILY 01/07/24   Antonette Angeline ORN, NP  ezetimibe -simvastatin  (VYTORIN ) 10-20 MG tablet Take 1 tablet by mouth daily. 10/19/23   Anner Alm ORN,  MD  hydrALAZINE  (APRESOLINE ) 25 MG tablet Take 2 tabs p.o. in a.m. and q. afternoon, 1 tab p.o. in the evening 05/29/24   Antonette Angeline ORN, NP  HYDROcodone -acetaminophen  (NORCO/VICODIN) 5-325 MG tablet Take 1 tablet by mouth every 6 (six) hours as needed for moderate pain (pain score 4-6). 06/22/24  Helon Kirsch A, PA-C  Hyoscyamine Sulfate SL 0.125 MG SUBL Take 1 tablet by mouth 2 (two) times daily as needed (Abdomal pain).    [provider]  ipratropium (ATROVENT ) 0.03 % nasal spray Place 2 sprays into both nostrils every 12 (twelve) hours. Patient taking differently: Place 2 sprays into both nostrils 2 (two) times daily as needed (Allergies). 09/20/23   Kennyth Domino, FNP  ketoconazole (NIZORAL) 2 % shampoo Apply 1 Application topically daily. With shower 08/02/23   [provider]  linaclotide  (LINZESS ) 145 MCG CAPS capsule Take 1 capsule (145 mcg total) by mouth daily. 06/20/24 12/17/24  Cinderella Deatrice FALCON, MD  loperamide  (IMODIUM  A-D) 2 MG tablet Take 2 mg by mouth 4 (four) times daily as needed for diarrhea or loose stools.    [provider]  MAGNESIUM  BISGLYCINATE PO Take 1 tablet by mouth daily.    [provider]  meclizine  (ANTIVERT ) 25 MG tablet Take 1 tablet (25 mg total) by mouth 3 (three) times daily as needed for dizziness. 05/30/24   Levander Slate, MD  melatonin 5 MG TABS Take 5 mg by mouth at bedtime.    [provider]  MIEBO 1.338 GM/ML SOLN Place 1 drop into both eyes daily as needed (Dry eyes). 09/23/23   [provider]  NONFORMULARY OR COMPOUNDED ITEM Bi-Mix Papaverine 30mg , Phentolamine 1mg    Dosage: Inject 0.25cc-0.5cc as need for ED   Vial 1ml   Qty #5 Refills 6   [facility] [phone] Fax [phone] Patient taking differently: as needed. Bi-Mix Papaverine 30mg , Phentolamine 1mg   Dosage: Inject 0.25cc-0.5cc as need for ED Vial 1ml   Qty #5 Refills 6   [facility] [phone] Fax  [phone] 05/28/23   Maurine Lukes, PA-C  Nutritional Supplements (NUTRITIONAL SUPPLEMENT PO) Take 1 capsule by mouth daily. [name]    [provider]  ondansetron  (ZOFRAN -ODT) 4 MG disintegrating tablet Take 1 tablet (4 mg total) by mouth every 8 (eight) hours as needed for nausea or vomiting. 06/18/24   Bradler, Evan K, MD  polyethylene glycol-electrolytes (NULYTELY ) 420 g solution as directed. 06/15/24   [provider]  PREVIDENT 5000 DRY MOUTH 1.1 % GEL dental gel Place 1 Application onto teeth 3 (three) times daily. 05/27/23   [provider]  Probiotic Product (PROBIOTIC DAILY PO) Take 1 capsule by mouth daily.    [provider]  propranolol  (INDERAL ) 40 MG tablet Take 40 mg by mouth 2 (two) times daily.    [provider]  RABEprazole  (ACIPHEX ) 20 MG tablet Take 2 tablets (40 mg total) by mouth 2 (two) times daily. 08/23/23 08/17/24  Therisa Bi, MD  testosterone  cypionate (DEPOTESTOSTERONE CYPIONATE) 200 MG/ML injection Inject 80 mg into the muscle every 14 (fourteen) days. 11/27/20   [provider]  traZODone  (DESYREL ) 50 MG tablet Take 0.5-1 tablets (25-50 mg total) by mouth at bedtime. 05/31/24   Antonette Angeline ORN, NP  triamcinolone cream (KENALOG) 0.1 % as needed (skin irritation, insect bites).    [provider]  valACYclovir  (VALTREX ) 1000 MG tablet Take 2 tabs p.o. and repeat in 12 hours as needed for cold sore 10/29/21   Antonette Angeline ORN, NP  Vibegron  (GEMTESA ) 75 MG TABS Take 1 tablet (75 mg total) by mouth daily. 05/24/24   Francisca Redell BROCKS, MD    Family History Family History  Problem Relation Age of Onset   Heart disease Father    Heart failure Father  Hypertension Father    Stroke Father    Vision loss Father    Stroke Mother    Dementia Mother    Diabetes Mother    Kidney Stones Daughter    Anxiety disorder Daughter    OCD Daughter     Social History Social History   Tobacco Use    Smoking status: Never    Passive exposure: Never   Smokeless tobacco: Never  Vaping Use   Vaping status: Never Used  Substance Use Topics   Alcohol use: Yes    Comment: 1 Drink a Month, socially   Drug use: Not Currently     Allergies   Ace inhibitors, Fluoxetine, Metoclopramide, Nalbuphine, Other, Amoxicillin-pot clavulanate, Doxazosin, Duloxetine, Penicillins, Tamsulosin, Trazodone  and nefazodone, Alirocumab , Amlodipine, Cinoxacin, Ciprofloxacin, Fludrocortisone , Nebivolol, Olanzapine, Olmesartan, Pregabalin, Prostaglandins, Thyroid  hormones, Venlafaxine , Venlafaxine  hcl er, Zolpidem, Duloxetine hcl, Fluoxetine hcl, Nucynta  [tapentadol ], and Phenytoin   Review of Systems Review of Systems   Physical Exam Triage Vital Signs ED Triage Vitals  Encounter Vitals Group     BP 07/07/24 1459 108/67     Girls Systolic BP Percentile --      Girls Diastolic BP Percentile --      Boys Systolic BP Percentile --      Boys Diastolic BP Percentile --      Pulse Rate 07/07/24 1459 80     Resp 07/07/24 1459 16     Temp 07/07/24 1459 99.7 F (37.6 C)     Temp Source 07/07/24 1459 Oral     SpO2 07/07/24 1459 94 %     Weight --      Height --      Head Circumference --      Peak Flow --      Pain Score 07/07/24 1456 9     Pain Loc --      Pain Education --      Exclude from Growth Chart --    No data found.  Updated Vital Signs BP 108/67 (BP Location: Left Arm)   Pulse 80   Temp 99.7 F (37.6 C) (Oral)   Resp 16   SpO2 94%   Visual Acuity Right Eye Distance:   Left Eye Distance:   Bilateral Distance:    Right Eye Near:   Left Eye Near:    Bilateral Near:     Physical Exam Constitutional:      Appearance: Normal appearance.  HENT:     Right Ear: Tympanic membrane, ear canal and external ear normal.     Left Ear: Tympanic membrane, ear canal and external ear normal.     Nose: Congestion present.     Mouth/Throat:     Mouth: Mucous membranes are moist.      Pharynx: Oropharynx is clear. No oropharyngeal exudate or posterior oropharyngeal erythema.  Eyes:     Extraocular Movements: Extraocular movements intact.  Cardiovascular:     Rate and Rhythm: Normal rate and regular rhythm.     Pulses: Normal pulses.     Heart sounds: Normal heart sounds.  Pulmonary:     Effort: Pulmonary effort is normal.     Breath sounds: Normal breath sounds.  Abdominal:     Tenderness: There is abdominal tenderness in the suprapubic area. There is right CVA tenderness. There is no left CVA tenderness.  Neurological:     Mental Status: He is alert and oriented to person, place, and time. Mental status is at baseline.      UC  Treatments / Results  Labs (all labs ordered are listed, but only abnormal results are displayed) Labs Reviewed  POCT URINE DIPSTICK - Abnormal; Notable for the following components:      Result Value   Clarity, UA cloudy (*)    Ketones, POC UA trace (5) (*)    POC PROTEIN,UA =30 (*)    Nitrite, UA Positive (*)    Leukocytes, UA Small (1+) (*)    All other components within normal limits  POC COVID19/FLU A&B COMBO - Normal  URINE CULTURE    EKG   Radiology No results found.  Procedures Procedures (including critical care time)  Medications Ordered in UC Medications - No data to display  Initial Impression / Assessment and Plan / UC Course  I have reviewed the triage vital signs and the nursing notes.  Pertinent labs & imaging results that were available during my care of the patient were reviewed by me and considered in my medical decision making (see chart for details).  Viral illness, acute cystitis with hematuria, urinary frequency  Patient is in no signs of distress nor toxic appearing.  Vital signs are stable.  Low suspicion for pneumonia, pneumothorax or bronchitis and therefore will defer imaging.  Prescribed Tessalon  and guaifenesin  codeine, PDMP reviewed, low risk.May use additional over-the-counter medications  as needed for supportive care.  May follow-up with urgent care as needed if symptoms persist or worsen.  Urinalysis showing leukocytes and nitrates, sent for culture, prescribed Macrobid for treatment, recommended over-the-counter and nonpharmacological measures and advised follow-up if symptoms persist worsen or  Final Clinical Impressions(s) / UC Diagnoses   Final diagnoses:  Viral illness  Urinary frequency  Acute cystitis with hematuria     Discharge Instructions      Your symptoms today are most likely being caused by a virus and should steadily improve in time it can take up to 7 to 10 days before you truly start to see a turnaround however things will get better  COVID and flu testing negative  You may use Tessalon  pill every 8 hours as needed for cough, may use cough every 6 hours or just at bedtime as needed to further assist with coughing    You can take Tylenol  and/or Ibuprofen as needed for fever reduction and pain relief.   For cough: honey 1/2 to 1 teaspoon (you can dilute the honey in water or another fluid).  You can also use guaifenesin  and dextromethorphan  for cough. You can use a humidifier for chest congestion and cough.  If you don't have a humidifier, you can sit in the bathroom with the hot shower running.      For sore throat: try warm salt water gargles, cepacol lozenges, throat spray, warm tea or water with lemon/honey, popsicles or ice, or OTC cold relief medicine for throat discomfort.   For congestion: take a daily anti-histamine like Zyrtec, Claritin , and a oral decongestant, such as pseudoephedrine.  You can also use Flonase 1-2 sprays in each nostril daily.   It is important to stay hydrated: drink plenty of fluids (water, gatorade/powerade/pedialyte, juices, or teas) to keep your throat moisturized and help further relieve irritation/discomfort.  Your urinalysis shows Shinichi Anguiano blood cells and nitrates which are indicative of infection, your urine will be  sent to the lab to determine exactly which bacteria is present, if any changes need to be made to your medications you will be notified  Begin use of Macrobid twice daily for 7 days  You may use  over-the-counter Azo to help minimize your symptoms until antibiotic removes bacteria, this medication will turn your urine orange  Increase your fluid intake through use of water  If symptoms continue to persist after use of medication or recur please follow-up with urgent care or your primary doctor as needed     ED Prescriptions     Medication Sig Dispense Auth. Provider   nitrofurantoin, macrocrystal-monohydrate, (MACROBID) 100 MG capsule Take 1 capsule (100 mg total) by mouth 2 (two) times daily for 7 days. 14 capsule Lennart Gladish R, NP   benzonatate  (TESSALON ) 100 MG capsule Take 1 capsule (100 mg total) by mouth every 8 (eight) hours. 21 capsule Tayshun Gappa R, NP   guaiFENesin -codeine 100-10 MG/5ML syrup Take 5 mLs by mouth every 6 (six) hours as needed for cough. 120 mL Valeri Sula, Shelba SAUNDERS, NP      I have reviewed the PDMP during this encounter.   Teresa Shelba SAUNDERS, NP 07/07/24 1600

## 2024-07-07 NOTE — ED Triage Notes (Signed)
 Patient reports fever, head ache and body aches, nausea, diarrhea x 2 days. Patient had a temperature of 101.9. Patient has been Tylenol  with mild relief. Last dose of  Tylenol   at 9:00 am. Rates headache 9/10 and body aches 8/10. Patient had stent removed Tuesday. Stent was placed because of Kidney stone. Patient states he also has urinary frequency.

## 2024-07-09 LAB — URINE CULTURE: Culture: >=100000 — AB

## 2024-07-10 ENCOUNTER — Ambulatory Visit (HOSPITAL_COMMUNITY): Payer: Self-pay

## 2024-07-10 MED ORDER — SULFAMETHOXAZOLE-TRIMETHOPRIM 800-160 MG PO TABS: 1.0000 | ORAL_TABLET | Freq: Two times a day (BID) | ORAL | 0 refills | Status: AC

## 2024-07-13 ENCOUNTER — Encounter (HOSPITAL_COMMUNITY)
Admission: RE | Admit: 2024-07-13 | Discharge: 2024-07-13 | Disposition: A | Discharge status: Home / Self Care | Source: Ambulatory Visit | Attending: Gastroenterology | Admitting: Gastroenterology

## 2024-07-14 ENCOUNTER — Other Ambulatory Visit: Payer: Self-pay | Admitting: Internal Medicine

## 2024-07-15 ENCOUNTER — Encounter: Payer: Self-pay | Admitting: Internal Medicine

## 2024-07-15 NOTE — Telephone Encounter (Signed)
 Requested Prescriptions  Pending Prescriptions Disp Refills   escitalopram  (LEXAPRO ) 20 MG tablet [Pharmacy Med Name: ESCITALOPRAM  20MG  TABLETS] 90 tablet 1    Sig: TAKE 1 TABLET(20 MG) BY MOUTH DAILY     Psychiatry:  Antidepressants - SSRI Passed - 07/15/2024  6:04 PM      Passed - Completed PHQ-2 or PHQ-9 in the last 360 days      Passed - Valid encounter within last 6 months    Recent Outpatient Visits           2 months ago Hyperlipidemia LDL goal <70   [place] [facility] [place], Angeline ORN, NP   3 months ago Functional diarrhea   [place] [facility] [place], Angeline ORN, NP   4 months ago Anxiety and depression   [place] [facility] [place], [place] W, NP   8 months ago Intractable chronic migraine without aura and without status migrainosus    [facility] [place], Angeline ORN, NP       Future Appointments             In 10 months Francisca, Redell BROCKS, MD [facility] [place]

## 2024-07-17 MED ORDER — PROPRANOLOL HCL 40 MG PO TABS: 40.0000 mg | ORAL_TABLET | Freq: Two times a day (BID) | ORAL | 0 refills | Status: DC

## 2024-07-17 MED ORDER — MECLIZINE HCL 25 MG PO TABS: 25.0000 mg | ORAL_TABLET | Freq: Three times a day (TID) | ORAL | 0 refills | Status: AC | PRN

## 2024-07-17 NOTE — Anesthesia Preprocedure Evaluation (Signed)
 Anesthesia Evaluation  Patient identified by MRN, date of birth, ID band Patient awake    Reviewed: Allergy & Precautions, NPO status , Patient's Chart, lab work & pertinent test results  History of Anesthesia Complications (+) PONV and history of anesthetic complications  Airway Mallampati: III  TM Distance: >3 FB Neck ROM: full    Dental  (+) Chipped, Dental Advisory Given   Pulmonary sleep apnea and Continuous Positive Airway Pressure Ventilation    Pulmonary exam normal        Cardiovascular hypertension, + CAD, + Past MI and + Cardiac Stents  Normal cardiovascular exam  EF 55%. Stent 2023 for acute MI NSTEMI. Ascending aortic aneurysm   Neuro/Psych  Headaches PSYCHIATRIC DISORDERS Anxiety Depression    Polyneuropathy. Spinal cord stimulator  Neuromuscular disease    GI/Hepatic Neg liver ROS, hiatal hernia, PUD,GERD  ,,  Endo/Other  negative endocrine ROS    Renal/GU CRFRenal diseaseHistory listed CRF but most recent lab OK  negative genitourinary   Musculoskeletal   Abdominal   Peds  Hematology  (+) Blood dyscrasia, anemia   Anesthesia Other Findings Past Medical History: No date: Allergy No date: Anemia No date: Aneurysm of ascending aorta     Comment:  a.) CT chest 08/18/2022: 4.1 cm; b.) CT chest               12/07/2022: 4.2 cm No date: Anxiety     Comment:  a.) on BZO (diazepam ) PRN No date: Aortic atherosclerosis 2015: Cataract No date: Chronic back pain 1980's: Chronic kidney disease     Comment:  Kidney Stones 01-2022: Clotting disorder     Comment:  Plavix  No date: Coronary artery disease     Comment:  a.) s/p PCI with DES x 2 (pLCx and o-mLAD) 11/27/2021 No date: Depression No date: Erectile dysfunction     Comment:  a.) Bi-mix (papaverine + phentolamine) injections +               exogenous testosterone  injections No date: GERD (gastroesophageal reflux disease) No date: h/o Lyme  disease No date: Headache No date: Hiatal hernia 01/2021: History of bilateral cataract extraction No date: History of gastritis No date: History of kidney stones No date: History of neuropathy No date: Hyperlipidemia LDL goal <70     Comment:  a.) CAD with NSTEMI -->  intolerant of statins with               statin myopathy and memory issues.;  Also intolerant of               Repatha  No date: Hypertension     Comment:  Labile blood pressures but dizziness with blood               pressures in the normal range; intolerant of most               medications including ARB's, ACE inhibitor's, amlodipine               most beta-blockers other than propranolol . No date: Insomnia     Comment:  a.) takes malatonin PRN No date: Left lower lobe pulmonary nodule     Comment:  a.) chest CT 12/07/2022: nodules x 2 posteromedial LLL;               8 x 11 mm and 8 x 10 mm No date: Long term current use of antithrombotics/antiplatelets     Comment:  a.) DAPT (ASA + clopidogrel ) No  date: Long term current use of aromatase inhibitor     Comment:  a,) anastrozole  --> estridiol suppression secondary to               exogenous testosterone  use No date: Lumbar radicular pain No date: Myocardial infarction [facility])     Comment:  a.) MI x 2 - 1990 * 1999 - PTCA 11/27/2021: NSTEMI (non-ST elevated myocardial infarction) [facility])     Comment:  a.) LHC 11/28/2021: sequential 75% o-pLAD, 70% OM2, 80%               p-mLAD, 99% mLAD, 100% pLCx --> PCI placing a 2.5 x 26 mm              Onyx Frontier DES to pLCx and a 2.5 x 24 mm Onyx Frontier              DES to the o-m LAD (covering 3 lesions) No date: Overactive bladder No date: Pneumonia No date: PONV (postoperative nausea and vomiting) No date: Recurrent herpes labialis     Comment:  a.) has suppressive valacyclovir  to use PRN No date: Skin cancer, basal cell No date: Spinal cord stimulator status     Comment:  01/08/21 - not currently using. No date:  Squamous cell skin cancer No date: Substance abuse (HCC) 2022: Syncope and collapse  Past Surgical History: No date: BACK SURGERY No date: BASAL CELL CARCINOMA EXCISION 07/27/2023: BIOPSY     Comment:  Procedure: BIOPSY;  Surgeon: Therisa Bi, MD;  Location:              [facility] ENDOSCOPY;  Service: Gastroenterology;; 01/14/2021: CATARACT EXTRACTION W/PHACO; Left     Comment:  Procedure: CATARACT EXTRACTION PHACO AND INTRAOCULAR               LENS PLACEMENT (IOC) LEFT VIVITY TORIC LENS 8.75 00:56.7;              Surgeon: Jaye Fallow, MD;  Location: [facility] SURGERY              CNTR;  Service: Ophthalmology;  Laterality: Left; 01/28/2021: CATARACT EXTRACTION W/PHACO; Right     Comment:  Procedure: CATARACT EXTRACTION PHACO AND INTRAOCULAR               LENS PLACEMENT (IOC) RIGHT VIVITY TORIC LENS;  Surgeon:               Jaye Fallow, MD;  Location: [facility];                Service: Ophthalmology;  Laterality: Right;                6.54 00:46.4 2017: CHOLECYSTECTOMY 09/15/2015: COLONOSCOPY     Comment:  Per New Patient Packet 01/26/2024: COLONOSCOPY; N/A     Comment:  Procedure: COLONOSCOPY;  Surgeon: Therisa Bi, MD;                Location: [facility] ENDOSCOPY;  Service: Gastroenterology;                Laterality: N/A; 10/03/2020: COLONOSCOPY WITH PROPOFOL ; N/A     Comment:  Procedure: COLONOSCOPY WITH PROPOFOL ;  Surgeon: Therisa Bi, MD;  Location: [name] ENDOSCOPY;  Service:               Gastroenterology;  Laterality: N/A; 1998, 2023: CORONARY ANGIOPLASTY 11/27/2021: CORONARY/GRAFT ACUTE MI REVASCULARIZATION; N/A  Comment:  Procedure: Coronary/Graft Acute MI Revascularization;                Surgeon: Anner Alm ORN, MD;  Location: [facility] CATH:               (NSTEMI) - > 100% prox-mid LCx (Onyx Frontier DES 2.5 x               26 -> 2.7 mm, Ost  OM1 65%.  Ost-mid LAD 3 lesions 75%,               90%, 99% => DES PCI Onyx Frontier DES 2.5 x 34 ->  2.8 mm 10/03/2020: ESOPHAGOGASTRODUODENOSCOPY (EGD) WITH PROPOFOL ; N/A     Comment:  Procedure: ESOPHAGOGASTRODUODENOSCOPY (EGD) WITH               PROPOFOL ;  Surgeon: Therisa Bi, MD;  Location: [facility]               ENDOSCOPY;  Service: Gastroenterology;  Laterality: N/A; 07/27/2023: ESOPHAGOGASTRODUODENOSCOPY (EGD) WITH PROPOFOL ; N/A     Comment:  Procedure: ESOPHAGOGASTRODUODENOSCOPY (EGD) WITH               PROPOFOL ;  Surgeon: Therisa Bi, MD;  Location: [facility]               ENDOSCOPY;  Service: Gastroenterology;  Laterality: N/A; 03/18/2020: FOOT SURGERY; Bilateral 08/032021: FOOT SURGERY 09/15/2015: GALLBLADDER SURGERY     Comment:  Gallbladder Removal. Procedure done by Dr.Beverly. Per               New Patient Packet 09/14/1980: KIDNEY STONE SURGERY     Comment:  Too many to count. Per New Patient Packet               09/14/1980-09/15/1995 09/15/1995: KIDNEY STONE SURGERY     Comment:  Too many to count. Per New Patient Packet 11/27/2021: LEFT HEART CATH AND CORONARY ANGIOGRAPHY; N/A     Comment:  Procedure: LEFT HEART CATH AND CORONARY ANGIOGRAPHY;                Surgeon: Anner Alm ORN, MD;  Location: [facility] CATH:                NSTEMI - 2 V CAD: 100% thrombotic prox-mid LCx (DES PCI),              ost OM1 65%; Ost LAD 75% - prox LAD 90%, prox-mid 99%               (DES PCI).  MIld diffuse RCA disease - calcicifed.  R-L               collaterals filling LCx. EF ~45-50% with lateral HK.               LVEDP 28 mmHg 09/14/2014: LITHOTRIPSY     Comment:  Per New Patient Packet 09/14/1996: LITHOTRIPSY     Comment:  No Stints Used. Per New Patient Packet 09/14/1997: LITHOTRIPSY     Comment:  No Stints used. Per New Patient Packet No date: PAIN PUMP IMPLANTATION No date: PAIN PUMP REMOVAL 1984: SHOULDER SURGERY; Right 09/14/2017: SIGMOIDOSCOPY     Comment:  Per New Patient Packet No date: SPINAL CORD STIMULATOR IMPLANT 1999, 2000: SPINE SURGERY     Comment:  Fusion, Inst.  Fusion No date: TONSILLECTOMY 11/28/2021: TRANSTHORACIC ECHOCARDIOGRAM     Comment:  (in setting of non-STEMI): EF 50 to 55%.  Suspect  inferior and lateral hypokinesis.  Indeterminate               diastolic parameters.  Normal RV size and function.                Normal RAP.  Normal aortic and mitral valves with only               mild MR and AI.  Normal RVP and RAP. No date: UPPER GI ENDOSCOPY 01/15/2023: VIDEO BRONCHOSCOPY WITH ENDOBRONCHIAL ULTRASOUND; N/A     Comment:  Procedure: VIDEO BRONCHOSCOPY WITH ENDOBRONCHIAL               ULTRASOUND;  Surgeon: Parris Manna, MD;  Location:               [facility] ORS;  Service: Thoracic;  Laterality: N/A;     Reproductive/Obstetrics negative OB ROS                              Anesthesia Physical Anesthesia Plan  ASA: 3  Anesthesia Plan: General   Post-op Pain Management: Minimal or no pain anticipated   Induction: Intravenous  PONV Risk Score and Plan: Propofol  infusion  Airway Management Planned: Nasal Cannula and Natural Airway  Additional Equipment: None  Intra-op Plan:   Post-operative Plan:   Informed Consent: I have reviewed the patients History and Physical, chart, labs and discussed the procedure including the risks, benefits and alternatives for the proposed anesthesia with the patient or authorized representative who has indicated his/her understanding and acceptance.     Dental Advisory Given  Plan Discussed with: CRNA  Anesthesia Plan Comments: (Patient consented for risks of anesthesia including but not limited to:  - adverse reactions to medications - damage to eyes, teeth, lips or other oral mucosa - nerve damage due to positioning  - sore throat or hoarseness - Damage to heart, brain, nerves, lungs, other parts of body or loss of life  Patient voiced understanding and assent.)         Anesthesia Quick Evaluation

## 2024-07-18 ENCOUNTER — Ambulatory Visit (HOSPITAL_COMMUNITY): Payer: Self-pay | Admitting: Anesthesiology

## 2024-07-18 ENCOUNTER — Encounter (INDEPENDENT_AMBULATORY_CARE_PROVIDER_SITE_OTHER): Payer: Self-pay | Admitting: Gastroenterology

## 2024-07-18 ENCOUNTER — Ambulatory Visit (HOSPITAL_COMMUNITY)
Admission: RE | Admit: 2024-07-18 | Discharge: 2024-07-18 | Disposition: A | Discharge status: Home / Self Care | Attending: Gastroenterology | Admitting: Gastroenterology

## 2024-07-18 ENCOUNTER — Encounter (HOSPITAL_COMMUNITY)
Admission: RE | Disposition: A | Discharge status: Home / Self Care | Payer: Self-pay | Source: Home / Self Care | Attending: Gastroenterology

## 2024-07-18 ENCOUNTER — Ambulatory Visit (HOSPITAL_BASED_OUTPATIENT_CLINIC_OR_DEPARTMENT_OTHER): Payer: Self-pay | Admitting: Anesthesiology

## 2024-07-18 DIAGNOSIS — G4730 Sleep apnea, unspecified: Secondary | ICD-10-CM | POA: Diagnosis not present

## 2024-07-18 DIAGNOSIS — I129 Hypertensive chronic kidney disease with stage 1 through stage 4 chronic kidney disease, or unspecified chronic kidney disease: Secondary | ICD-10-CM | POA: Insufficient documentation

## 2024-07-18 DIAGNOSIS — K5730 Diverticulosis of large intestine without perforation or abscess without bleeding: Secondary | ICD-10-CM | POA: Diagnosis not present

## 2024-07-18 DIAGNOSIS — R1013 Epigastric pain: Secondary | ICD-10-CM | POA: Diagnosis present

## 2024-07-18 DIAGNOSIS — K2970 Gastritis, unspecified, without bleeding: Secondary | ICD-10-CM | POA: Insufficient documentation

## 2024-07-18 DIAGNOSIS — Z8249 Family history of ischemic heart disease and other diseases of the circulatory system: Secondary | ICD-10-CM | POA: Insufficient documentation

## 2024-07-18 DIAGNOSIS — Z955 Presence of coronary angioplasty implant and graft: Secondary | ICD-10-CM | POA: Insufficient documentation

## 2024-07-18 DIAGNOSIS — I2510 Atherosclerotic heart disease of native coronary artery without angina pectoris: Secondary | ICD-10-CM | POA: Diagnosis not present

## 2024-07-18 DIAGNOSIS — Z1211 Encounter for screening for malignant neoplasm of colon: Secondary | ICD-10-CM | POA: Insufficient documentation

## 2024-07-18 DIAGNOSIS — K219 Gastro-esophageal reflux disease without esophagitis: Secondary | ICD-10-CM | POA: Diagnosis not present

## 2024-07-18 DIAGNOSIS — I252 Old myocardial infarction: Secondary | ICD-10-CM | POA: Diagnosis not present

## 2024-07-18 DIAGNOSIS — K3189 Other diseases of stomach and duodenum: Secondary | ICD-10-CM | POA: Diagnosis not present

## 2024-07-18 DIAGNOSIS — Z8711 Personal history of peptic ulcer disease: Secondary | ICD-10-CM | POA: Diagnosis not present

## 2024-07-18 DIAGNOSIS — I7121 Aneurysm of the ascending aorta, without rupture: Secondary | ICD-10-CM | POA: Insufficient documentation

## 2024-07-18 DIAGNOSIS — Z9682 Presence of neurostimulator: Secondary | ICD-10-CM | POA: Insufficient documentation

## 2024-07-18 DIAGNOSIS — K449 Diaphragmatic hernia without obstruction or gangrene: Secondary | ICD-10-CM | POA: Insufficient documentation

## 2024-07-18 DIAGNOSIS — G709 Myoneural disorder, unspecified: Secondary | ICD-10-CM | POA: Insufficient documentation

## 2024-07-18 DIAGNOSIS — G629 Polyneuropathy, unspecified: Secondary | ICD-10-CM | POA: Diagnosis not present

## 2024-07-18 DIAGNOSIS — F418 Other specified anxiety disorders: Secondary | ICD-10-CM | POA: Insufficient documentation

## 2024-07-18 DIAGNOSIS — K648 Other hemorrhoids: Secondary | ICD-10-CM | POA: Diagnosis not present

## 2024-07-18 DIAGNOSIS — N189 Chronic kidney disease, unspecified: Secondary | ICD-10-CM | POA: Diagnosis not present

## 2024-07-18 HISTORY — PX: COLONOSCOPY: SHX5424

## 2024-07-18 HISTORY — PX: ESOPHAGOGASTRODUODENOSCOPY: SHX5428

## 2024-07-18 SURGERY — COLONOSCOPY
Anesthesia: General

## 2024-07-18 MED ORDER — STERILE WATER FOR IRRIGATION IR SOLN
Status: DC | PRN
  Administered 2024-07-18: 120 mL

## 2024-07-18 MED ORDER — LACTATED RINGERS IV SOLN: INTRAVENOUS | Status: DC

## 2024-07-18 MED ORDER — EPHEDRINE SULFATE-NACL 50-0.9 MG/10ML-% IV SOSY
PREFILLED_SYRINGE | INTRAVENOUS | Status: DC | PRN
  Administered 2024-07-18: 5 mg via INTRAVENOUS

## 2024-07-18 MED ORDER — PROPOFOL 10 MG/ML IV BOLUS
INTRAVENOUS | Status: DC | PRN
  Administered 2024-07-18: 50 mg via INTRAVENOUS

## 2024-07-18 MED ORDER — LACTATED RINGERS IV SOLN: INTRAVENOUS | Status: DC | PRN

## 2024-07-18 MED ORDER — LIDOCAINE 2% (20 MG/ML) 5 ML SYRINGE
INTRAMUSCULAR | Status: DC | PRN
  Administered 2024-07-18: 80 mg via INTRAVENOUS

## 2024-07-18 MED ORDER — PROPOFOL 500 MG/50ML IV EMUL
INTRAVENOUS | Status: DC | PRN
  Administered 2024-07-18: 125 ug/kg/min via INTRAVENOUS

## 2024-07-18 NOTE — Op Note (Signed)
 [facility] Patient Name: Grant Young Procedure Date: 07/18/2024 9:04 AM MRN: [id number] Date of Birth: 08/05/1946 Attending MD: Deatrice Dine , MD, [id number] CSN: [id number] Age: 78 Admit Type: Outpatient Procedure:                Colonoscopy Indications:              Screening for colorectal malignant neoplasm Providers:                Deatrice Dine, MD, Harlene Lips, Crystal Page Referring MD:              Medicines:                Monitored Anesthesia Care Complications:            No immediate complications. Estimated Blood Loss:     Estimated blood loss: none. Procedure:                Pre-Anesthesia Assessment:                           - Prior to the procedure, a History and Physical                            was performed, and patient medications and                            allergies were reviewed. The patient's tolerance of                            previous anesthesia was also reviewed. The risks                            and benefits of the procedure and the sedation                            options and risks were discussed with the patient.                            All questions were answered, and informed consent                            was obtained. Prior Anticoagulants: The patient has                            taken no anticoagulant or antiplatelet agents. ASA                            Grade Assessment: III - A patient with severe                            systemic disease. After reviewing the risks and                            benefits, the patient was deemed in satisfactory  condition to undergo the procedure.                           After obtaining informed consent, the colonoscope                            was passed under direct vision. Throughout the                            procedure, the patient's blood pressure, pulse, and                            oxygen saturations were monitored continuously.  The                            [id number]-[id number] ([id number]) Colon was introduced through                            the anus and advanced to the the cecum, identified                            by appendiceal orifice and ileocecal valve. The                            colonoscopy was performed without difficulty. The                            patient tolerated the procedure well. The quality                            of the bowel preparation was evaluated using the                            BBPS [facility] Bowel Preparation Scale) with scores                            of: Right Colon = 3, Transverse Colon = 3 and Left                            Colon = 3 (entire mucosa seen well with no residual                            staining, small fragments of stool or opaque                            liquid). The total BBPS score equals 9. The                            ileocecal valve, appendiceal orifice, and rectum                            were photographed. Scope In: 9:36:24 AM Scope Out: 9:55:32 AM Scope Withdrawal Time: 0 hours 11 minutes 7  seconds  Total Procedure Duration: 0 hours 19 minutes 8 seconds  Findings:      A few small-mouthed diverticula were found in the left colon.      Non-bleeding internal hemorrhoids were found during retroflexion. The       hemorrhoids were small. Impression:               - Diverticulosis in the left colon.                           - Non-bleeding internal hemorrhoids.                           - No specimens collected. Moderate Sedation:      Per Anesthesia Care Recommendation:           - Patient has a contact number available for                            emergencies. The signs and symptoms of potential                            delayed complications were discussed with the                            patient. Return to normal activities tomorrow.                            Written discharge instructions were provided to the                             patient.                           - Resume previous diet.                           - Continue present medications.                           - No repeat colonoscopy due to current age (42                            years or older). Procedure Code(s):        --- Professional ---                           H9878, Colorectal cancer screening; colonoscopy on                            individual not meeting criteria for high risk Diagnosis Code(s):        --- Professional ---                           Z12.11, Encounter for screening for malignant                            neoplasm of colon  K64.8, Other hemorrhoids                           K57.30, Diverticulosis of large intestine without                            perforation or abscess without bleeding CPT copyright 2022 American Medical Association. All rights reserved. The codes documented in this report are preliminary and upon coder review may  be revised to meet current compliance requirements. Deatrice Dine, MD Deatrice Dine, MD 07/18/2024 10:06:11 AM This report has been signed electronically. Number of Addenda: 0

## 2024-07-18 NOTE — Op Note (Signed)
 [facility] Patient Name: Grant Young Procedure Date: 07/18/2024 9:05 AM MRN: [id number] Date of Birth: 12-20-1945 Attending MD: Deatrice Dine , MD, [id number] CSN: [id number] Age: 78 Admit Type: Outpatient Procedure:                Upper GI endoscopy Indications:              Epigastric abdominal pain Providers:                Deatrice Dine, MD, Harlene Lips, Crystal Page Referring MD:              Medicines:                Monitored Anesthesia Care Complications:            No immediate complications. Estimated Blood Loss:     Estimated blood loss was minimal. Procedure:                Pre-Anesthesia Assessment:                           - Prior to the procedure, a History and Physical                            was performed, and patient medications and                            allergies were reviewed. The patient's tolerance of                            previous anesthesia was also reviewed. The risks                            and benefits of the procedure and the sedation                            options and risks were discussed with the patient.                            All questions were answered, and informed consent                            was obtained. Prior Anticoagulants: The patient has                            taken no anticoagulant or antiplatelet agents. ASA                            Grade Assessment: III - A patient with severe                            systemic disease. After reviewing the risks and                            benefits, the patient was deemed in satisfactory  condition to undergo the procedure.                           After obtaining informed consent, the endoscope was                            passed under direct vision. Throughout the                            procedure, the patient's blood pressure, pulse, and                            oxygen saturations were monitored continuously. The                             [id number] ([id number]) Upper was introduced through                            the mouth, and advanced to the second part of                            duodenum. The upper GI endoscopy was accomplished                            without difficulty. The patient tolerated the                            procedure well. Scope In: 9:22:10 AM Scope Out: 9:31:21 AM Total Procedure Duration: 0 hours 9 minutes 11 seconds  Findings:      The examined esophagus was normal.      A 2 cm hiatal hernia was present.      Mild inflammation characterized by adherent blood was found in the       stomach. Biopsies were taken with a cold forceps for histology.      Extrinsic compression on the stomach was found on the anterior wall of       the stomach.      The duodenal bulb and second portion of the duodenum were normal.       Biopsies were taken with a cold forceps for histology. Impression:               - Normal esophagus.                           - 2 cm hiatal hernia.                           - Gastritis. Biopsied.                           - 3-4 cm Extrinsic compression in the anterior wall                            of the stomach. ( Appears to be mobile with  respiration and non pulsating)                           - Normal duodenal bulb and second portion of the                            duodenum. Biopsied. Moderate Sedation:      Per Anesthesia Care Recommendation:           - Patient has a contact number available for                            emergencies. The signs and symptoms of potential                            delayed complications were discussed with the                            patient. Return to normal activities tomorrow.                            Written discharge instructions were provided to the                            patient.                           - Resume previous diet.                           - Continue present  medications.                           - Await pathology results.                           -Recent CTA Abdomen and Pelvis (10/08) was reviewed                            today with radiology : No evidence of AAA and                            anything explaining external compression to                            anterior wall of body of stomach , perhaps                            prominent costal cartilage                           -Given continued symptoms and protruding lesion in                            the anterior wall of body will refer for EUS Procedure Code(s):        --- Professional ---  56760, Esophagogastroduodenoscopy, flexible,                            transoral; with biopsy, single or multiple Diagnosis Code(s):        --- Professional ---                           K44.9, Diaphragmatic hernia without obstruction or                            gangrene                           K29.70, Gastritis, unspecified, without bleeding                           K31.89, Other diseases of stomach and duodenum                           R10.13, Epigastric pain CPT copyright 2022 American Medical Association. All rights reserved. The codes documented in this report are preliminary and upon coder review may  be revised to meet current compliance requirements. Deatrice Dine, MD Deatrice Dine, MD 07/18/2024 10:02:39 AM This report has been signed electronically. Number of Addenda: 0

## 2024-07-18 NOTE — Transfer of Care (Signed)
 Immediate Anesthesia Transfer of Care Note  Patient: Grant Young  Procedure(s) Performed: COLONOSCOPY EGD (ESOPHAGOGASTRODUODENOSCOPY)  Patient Location: PACU  Anesthesia Type:MAC  Level of Consciousness: awake and alert   Airway & Oxygen Therapy: Patient Spontanous Breathing and Patient connected to nasal cannula oxygen  Post-op Assessment: Report given to RN and Post -op Vital signs reviewed and stable  Post vital signs: Reviewed and stable  Last Vitals:  Vitals Value Taken Time  BP 124/60 07/18/24 10:03  Temp 36.3 C 07/18/24 10:03  Pulse 62 07/18/24 10:03  Resp 14 07/18/24 10:03  SpO2 100 % 07/18/24 10:03    Last Pain:  Vitals:   07/18/24 1003  TempSrc: Axillary  PainSc: 0-No pain      Patients Stated Pain Goal: 7 (07/18/24 0806)  Complications: No notable events documented.

## 2024-07-18 NOTE — H&P (Addendum)
 Primary Care Physician:  Antonette Angeline ORN, NP Primary Gastroenterologist:  Dr. Cinderella  Pre-Procedure History & Physical: HPI: Grant Young is a 78 y.o. male with coronary artery disease status post PCI on aspirin , chronic back pain/neuropathy, ascending aortic aneurysm follows with pain management who presents for evaluation of chronic progressive abdominal pain and screening colonoscopy     Patient reports symptoms beginning November 2024 and since then has been progressive.  He reports pain is located at the periumbilical area and would radiate to lower abdomen.  Reports pain is worse in an empty stomach and relieves with food intake.  Patient had been to ER multiple times.  Patient had a CT abdomen and CT head not explain patient's persistent symptoms   Patient was previously on Aciphex  with good control of his symptoms but despite taking it twice daily patient now continues to be symptomatic Patient was previously given Linzess  72 mcg but due to diarrhea had to stop.   As per the patient he had tried Bentyl , Levsin and IBgard without any relief abdominal pain   01/2024 colonoscopy - Preparation of the colon was inadequate. - Stool in the entire examined colon. - No specimens collected.   Repeat 3 months   10/03/2020: EGD: Normal study, colonoscopy: Internal hemorrhoids noted medium in size.  No polyps seen random colon biopsies taken which were negative for microscopic colitis.  Biopsies of the terminal ileum were also normal.    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   Kidney Stones   Clotting disorder 01-2022   Plavix    Coronary artery disease    a.) s/p PCI with DES x 2 (pLCx and o-mLAD) 11/27/2021   Depression    Erectile dysfunction    a.) Bi-mix (papaverine + phentolamine) injections + exogenous testosterone  injections   GERD (gastroesophageal reflux disease)    h/o Lyme disease    Headache    Hiatal hernia    History of bilateral cataract extraction 01/2021   History of gastritis    History of kidney stones    History of neuropathy    Hyperlipidemia LDL goal <70    a.) CAD with NSTEMI -->  intolerant of statins with statin myopathy and memory issues.;  Also intolerant of Repatha    Hypertension    Labile blood pressures but dizziness with blood pressures in the normal range; intolerant of most medications including ARB's, ACE inhibitor's, amlodipine most beta-blockers other than propranolol .   Insomnia    a.) takes malatonin PRN   Left lower lobe pulmonary nodule    a.) chest CT 12/07/2022: nodules x 2 posteromedial LLL;  8 x 11 mm and 8 x 10 mm   Long term current use of antithrombotics/antiplatelets    a.) DAPT (ASA + clopidogrel )   Long term current use of aromatase inhibitor  a,) anastrozole  --> estridiol suppression secondary to exogenous testosterone  use   Lumbar radicular pain    Myocardial infarction [facility])    a.) MI x 2 - 1990 * 1999 - PTCA   NSTEMI (non-ST elevated myocardial infarction) (HCC) 11/27/2021   a.) LHC 11/28/2021: sequential 75% o-pLAD, 70% OM2, 80% p-mLAD, 99% mLAD, 100% pLCx --> PCI placing a 2.5 x 26 mm Onyx Frontier DES to pLCx and a 2.5 x 24 mm Onyx Frontier DES to the o-m LAD (covering 3 lesions)   Overactive bladder    Pneumonia    PONV (postoperative nausea and vomiting)    Recurrent herpes labialis    a.) has suppressive valacyclovir  to use PRN    Skin cancer, basal cell    Spinal cord stimulator status    01/08/21 - not currently using.   Squamous cell skin cancer    Substance abuse (HCC)    Syncope and collapse 2022    Past Surgical History:  Procedure Laterality Date   BACK SURGERY     BASAL CELL CARCINOMA EXCISION     BIOPSY  07/27/2023   Procedure: BIOPSY;  Surgeon: Therisa Bi, MD;  Location: [facility] ENDOSCOPY;  Service: Gastroenterology;;   CATARACT EXTRACTION W/PHACO Left 01/14/2021   Procedure: CATARACT EXTRACTION PHACO AND INTRAOCULAR LENS PLACEMENT (IOC) LEFT VIVITY TORIC LENS 8.75 00:56.7;  Surgeon: Jaye Fallow, MD;  Location: [facility];  Service: Ophthalmology;  Laterality: Left;   CATARACT EXTRACTION W/PHACO Right 01/28/2021   Procedure: CATARACT EXTRACTION PHACO AND INTRAOCULAR LENS PLACEMENT (IOC) RIGHT VIVITY TORIC LENS;  Surgeon: Jaye Fallow, MD;  Location: [facility];  Service: Ophthalmology;  Laterality: Right;  6.54 00:46.4   CHOLECYSTECTOMY  2017   COLONOSCOPY  09/15/2015   Per New Patient Packet   COLONOSCOPY N/A 01/26/2024   Procedure: COLONOSCOPY;  Surgeon: Therisa Bi, MD;  Location: [facility] ENDOSCOPY;  Service: Gastroenterology;  Laterality: N/A;   COLONOSCOPY WITH PROPOFOL  N/A 10/03/2020   Procedure: COLONOSCOPY WITH PROPOFOL ;  Surgeon: Therisa Bi, MD;  Location: [name] ENDOSCOPY;  Service: Gastroenterology;  Laterality: N/A;   CORONARY ANGIOPLASTY  1998, 2023   CORONARY/GRAFT ACUTE MI REVASCULARIZATION N/A 11/27/2021   Procedure: Coronary/Graft Acute MI Revascularization;  Surgeon: Anner Alm ORN, MD;  Location: [facility] CATH: (NSTEMI) - > 100% prox-mid LCx (Onyx Frontier DES 2.5 x 26 -> 2.7 mm, Ost  OM1 65%.  Ost-mid LAD 3 lesions 75%, 90%, 99% => DES PCI Onyx Frontier DES 2.5 x 34 -> 2.8 mm   CYSTOSCOPY/URETEROSCOPY/HOLMIUM LASER/STENT PLACEMENT Left 06/26/2024   Procedure: CYSTOSCOPY/URETEROSCOPY/HOLMIUM LASER/STENT PLACEMENT;  Surgeon: Francisca Redell BROCKS, MD;  Location: [facility]  ORS;  Service: Urology;  Laterality: Left;   ESOPHAGOGASTRODUODENOSCOPY (EGD) WITH PROPOFOL  N/A 10/03/2020   Procedure: ESOPHAGOGASTRODUODENOSCOPY (EGD) WITH PROPOFOL ;  Surgeon: Therisa Bi, MD;  Location: [facility] ENDOSCOPY;  Service: Gastroenterology;  Laterality: N/A;   ESOPHAGOGASTRODUODENOSCOPY (EGD) WITH PROPOFOL  N/A 07/27/2023   Procedure: ESOPHAGOGASTRODUODENOSCOPY (EGD) WITH PROPOFOL ;  Surgeon: Therisa Bi, MD;  Location: [facility] ENDOSCOPY;  Service: Gastroenterology;  Laterality: N/A;   FOOT SURGERY Bilateral 03/18/2020   FOOT SURGERY  08/032021   GALLBLADDER SURGERY  09/15/2015   Gallbladder Removal. Procedure done by Dr.Beverly. Per New Patient Packet   KIDNEY STONE SURGERY  09/14/1980   Too many to count. Per New Patient Packet 09/14/1980-09/15/1995   KIDNEY STONE SURGERY  09/15/1995   Too many to count. Per New Patient Packet   LEFT HEART CATH AND CORONARY ANGIOGRAPHY N/A 11/27/2021   Procedure: LEFT HEART CATH AND CORONARY ANGIOGRAPHY;  Surgeon: Anner Alm ORN, MD;  Location: [facility] CATH:  NSTEMI - 2 V CAD: 100% thrombotic prox-mid LCx (DES PCI), ost OM1 65%; Ost LAD 75% - prox LAD 90%, prox-mid 99% (DES PCI).  MIld diffuse RCA disease - calcicifed.  R-L collaterals filling LCx. EF ~45-50% with lateral HK. LVEDP 28 mmHg   LITHOTRIPSY  09/14/2014   Per New Patient Packet   LITHOTRIPSY  09/14/1996   No Stints Used. Per New Patient Packet   LITHOTRIPSY  09/14/1997   No Stints used. Per New Patient Packet   PAIN PUMP IMPLANTATION     PAIN PUMP REMOVAL     SHOULDER SURGERY Right 1984   SIGMOIDOSCOPY  09/14/2017   Per New Patient Packet   SPINAL CORD STIMULATOR IMPLANT     SPINE SURGERY  1999, 2000   Fusion, Inst. Fusion   TONSILLECTOMY     TRANSTHORACIC ECHOCARDIOGRAM  11/28/2021   (in setting of non-STEMI): EF 50 to 55%.  Suspect inferior and lateral hypokinesis.  Indeterminate diastolic parameters.  Normal RV size and function.  Normal RAP.  Normal aortic and mitral valves with  only mild MR and AI.  Normal RVP and RAP.   UPPER GI ENDOSCOPY     VIDEO BRONCHOSCOPY WITH ENDOBRONCHIAL ULTRASOUND N/A 01/15/2023   Procedure: VIDEO BRONCHOSCOPY WITH ENDOBRONCHIAL ULTRASOUND;  Surgeon: Parris Manna, MD;  Location: [facility] ORS;  Service: Thoracic;  Laterality: N/A;    Prior to Admission medications   Medication Sig Start Date End Date Taking? Authorizing Provider  anastrozole  (ARIMIDEX ) 1 MG tablet Take 0.5 mg by mouth once a week. 05/07/21  Yes [provider]  aspirin  EC 81 MG tablet Take 1 tablet (81 mg total) by mouth daily. Swallow whole. 09/16/23  Yes Anner Alm ORN, MD  benzonatate  (TESSALON ) 100 MG capsule Take 1 capsule (100 mg total) by mouth every 8 (eight) hours. 07/07/24  Yes White, Adrienne R, NP  Cholecalciferol  50 MCG (2000 UT) CAPS Take 2,000 Units by mouth daily. 04/15/20  Yes [provider]  cholestyramine  (QUESTRAN ) 4 g packet Take 4 g by mouth daily as needed (diarrhea). 10/16/20  Yes [provider]  Cyanocobalamin  (VITAMIN B-12) 5000 MCG LOZG Take 1 lozenge by mouth daily.    Yes [provider]  DHEA 25 MG CAPS Take 25 mg by mouth daily.   Yes [provider]  escitalopram  (LEXAPRO ) 20 MG tablet TAKE 1 TABLET(20 MG) BY MOUTH DAILY 07/15/24  Yes [name], Angeline ORN, NP  ezetimibe -simvastatin  (VYTORIN ) 10-20 MG tablet Take 1 tablet by mouth daily. 10/19/23  Yes Anner Alm ORN, MD  guaiFENesin -codeine 100-10 MG/5ML syrup Take 5 mLs by mouth every 6 (six) hours as needed for cough. 07/07/24  Yes White, Shelba SAUNDERS, NP  hydrALAZINE  (APRESOLINE ) 25 MG tablet Take 2 tabs p.o. in a.m. and q. afternoon, 1 tab p.o. in the evening 05/29/24  Yes [name], Angeline ORN, NP  HYDROcodone -acetaminophen  (NORCO/VICODIN) 5-325 MG tablet Take 1 tablet by mouth every 6 (six) hours as needed for moderate pain (pain score 4-6). 06/22/24  Yes McGowan, Clotilda A, PA-C  Hyoscyamine Sulfate SL 0.125 MG SUBL Take 1 tablet by mouth 2 (two) times daily as  needed (Abdomal pain).   Yes [provider]  ipratropium (ATROVENT ) 0.03 % nasal spray Place 2 sprays into both nostrils every 12 (twelve) hours. Patient taking differently: Place 2 sprays into both nostrils 2 (two) times daily as needed (Allergies). 09/20/23  Yes Kennyth Domino, FNP  ketoconazole (NIZORAL) 2 % shampoo  Apply 1 Application topically daily. With shower 08/02/23  Yes [provider]  linaclotide  (LINZESS ) 145 MCG CAPS capsule Take 1 capsule (145 mcg total) by mouth daily. 06/20/24 12/17/24 Yes Tristy Udovich, Deatrice FALCON, MD  loperamide  (IMODIUM  A-D) 2 MG tablet Take 2 mg by mouth 4 (four) times daily as needed for diarrhea or loose stools.   Yes [provider]  MAGNESIUM  BISGLYCINATE PO Take 1 tablet by mouth daily.   Yes [provider]  meclizine  (ANTIVERT ) 25 MG tablet Take 1 tablet (25 mg total) by mouth 3 (three) times daily as needed for dizziness. 07/17/24  Yes [name], Angeline ORN, NP  melatonin 5 MG TABS Take 5 mg by mouth at bedtime.   Yes [provider]  MIEBO 1.338 GM/ML SOLN Place 1 drop into both eyes daily as needed (Dry eyes). 09/23/23  Yes [provider]  NONFORMULARY OR COMPOUNDED ITEM Bi-Mix Papaverine 30mg , Phentolamine 1mg    Dosage: Inject 0.25cc-0.5cc as need for ED   Vial 1ml   Qty #5 Refills 6   [facility] [phone] Fax [phone] Patient taking differently: as needed. Bi-Mix Papaverine 30mg , Phentolamine 1mg   Dosage: Inject 0.25cc-0.5cc as need for ED Vial 1ml   Qty #5 Refills 6   [facility] [phone] Fax [phone] 05/28/23  Yes Vaillancourt, Lucie, PA-C  Nutritional Supplements (NUTRITIONAL SUPPLEMENT PO) Take 1 capsule by mouth daily. [name]   Yes [provider]  ondansetron  (ZOFRAN -ODT) 4 MG disintegrating tablet Take 1 tablet (4 mg total) by mouth every 8 (eight) hours as needed for nausea or vomiting. 06/18/24  Yes Bradler, Evan K, MD   polyethylene glycol-electrolytes (NULYTELY ) 420 g solution as directed. 06/15/24  Yes [provider]  PREVIDENT 5000 DRY MOUTH 1.1 % GEL dental gel Place 1 Application onto teeth 3 (three) times daily. 05/27/23  Yes [provider]  Probiotic Product (PROBIOTIC DAILY PO) Take 1 capsule by mouth daily.   Yes [provider]  propranolol  (INDERAL ) 40 MG tablet Take 1 tablet (40 mg total) by mouth 2 (two) times daily. 07/17/24  Yes [name], Angeline ORN, NP  RABEprazole  (ACIPHEX ) 20 MG tablet Take 2 tablets (40 mg total) by mouth 2 (two) times daily. 08/23/23 08/17/24 Yes Therisa Bi, MD  testosterone  cypionate (DEPOTESTOSTERONE CYPIONATE) 200 MG/ML injection Inject 80 mg into the muscle every 14 (fourteen) days. 11/27/20  Yes [provider]  traZODone  (DESYREL ) 50 MG tablet Take 0.5-1 tablets (25-50 mg total) by mouth at bedtime. 05/31/24  Yes Antonette Angeline ORN, NP  triamcinolone cream (KENALOG) 0.1 % as needed (skin irritation, insect bites).   Yes [provider]  valACYclovir  (VALTREX ) 1000 MG tablet Take 2 tabs p.o. and repeat in 12 hours as needed for cold sore 10/29/21  Yes [name], Angeline ORN, NP  Vibegron  (GEMTESA ) 75 MG TABS Take 1 tablet (75 mg total) by mouth daily. 05/24/24  Yes Francisca Redell BROCKS, MD    Allergies as of 06/15/2024 - Review Complete 06/14/2024  Allergen Reaction Noted   Ace inhibitors  08/24/2019   Fluoxetine Anxiety 10/31/2019   Metoclopramide  10/31/2019   Nalbuphine  10/31/2019   Other  01/03/2014   Amoxicillin-pot clavulanate Nausea Only 10/31/2019   Doxazosin Rash 02/01/2013   Duloxetine Nausea Only 10/31/2019   Penicillins Nausea Only 10/31/2019   Tamsulosin Itching and Anxiety 02/01/2013   Trazodone  and nefazodone Itching, Anxiety, and Rash 02/13/2012   Alirocumab  Other (See Comments) 01/13/2024   Amlodipine  10/31/2019   Cinoxacin  10/31/2019   Ciprofloxacin  08/24/2019   Fludrocortisone  Other (See Comments) 08/04/2021    Nebivolol  08/24/2019   Olanzapine  10/31/2019   Olmesartan  08/24/2019   Pregabalin  10/31/2019   Prostaglandins  05/07/2014   Thyroid  hormones  06/09/2018   Venlafaxine  Other (See Comments) 07/29/2023   Venlafaxine  hcl er Hypertension 11/15/2022   Zolpidem  10/31/2019   Duloxetine hcl  01/03/2014   Fluoxetine hcl  01/03/2014   Nucynta  [tapentadol ] Other (See Comments) 07/15/2021   Phenytoin Anxiety 10/31/2019    Family History  Problem Relation Age of Onset   Heart disease Father    Heart failure Father    Hypertension Father    Stroke Father    Vision loss Father    Stroke Mother    Dementia Mother    Diabetes Mother    Kidney Stones Daughter    Anxiety disorder Daughter    OCD Daughter     Social History   Socioeconomic History   Marital status: Married    Spouse name: Therapist, Art   Number of children: Not on file   Years of education: Not on file   Highest education level: Master's degree (e.g., MA, MS, MEng, MEd, MSW, MBA)  Occupational History   Not on file  Tobacco Use   Smoking status: Never    Passive exposure: Never   Smokeless tobacco: Never  Vaping Use   Vaping status: Never Used  Substance and Sexual Activity   Alcohol use: Yes    Comment: 1 Drink a Month, socially   Drug use: Not Currently   Sexual activity: Yes    Birth control/protection: None  Other Topics Concern   Not on file  Social History Narrative   Tobacco use, amount per day now: None   Past tobacco use, amount per day: None   How many years did you use tobacco: 0   Alcohol use (drinks per week): 0-1 Month   Diet:   Do you drink/eat things with caffeine : Occasionally ( Hot Chocolate and maybe 1/4 of 16oz Pepsi 2-3 times a week.   Marital status: Married                                  What year were you married? 1970   Do you live in a house, apartment, assisted living, condo, trailer, etc.? House   Is it one or more stories? One   How many persons live in your home? 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 Yes                                    Type and how often? [place], Recumbent Bike, 3 times a week. Try to get a 1.2 mile walk at least 3 times a week or more.     Do you have a living will? Yes   Do you have a DNR form?  No                                 If not, do you want to discuss one?  Do you have signed POA/HPOA forms? Yes                       If so, please bring to you appointment    Do you have any difficulty bathing or dressing yourself? No    Do you have difficulty preparing food or eating? No   Do you have difficulty managing your medications? No   Do you have any difficulty managing your finances? No   Do you have any difficulty affording your medications? No         Social Drivers of Corporate Investment Banker Strain: Low Risk  (04/24/2024)   Overall Financial Resource Strain (CARDIA)    Difficulty of Paying Living Expenses: Not very hard  Food Insecurity: No Food Insecurity (04/24/2024)   Hunger Vital Sign    Worried About Running Out of Food in the Last Year: Never true    Ran Out of Food in the Last Year: Never true  Transportation Needs: No Transportation Needs (04/24/2024)   PRAPARE - Administrator, Civil Service (Medical): No    Lack of Transportation (Non-Medical): No  Physical Activity: Insufficiently Active (12/24/2023)   Exercise Vital Sign    Days of Exercise per Week: 3 days    Minutes of Exercise per Session: 30 min  Stress: No Stress Concern Present (12/24/2023)   [employer] of Occupational Health - Occupational Stress Questionnaire    Feeling of Stress : Not at all  Social Connections: Moderately Isolated (04/24/2024)   Social Connection and Isolation Panel    Frequency of Communication with Friends and Family: Once a week    Frequency  of Social Gatherings with Friends and Family: Once a week    Attends Religious Services: 1 to 4 times per year    Active Member of [employer] or Organizations: No    Attends Banker Meetings: Not on file    Marital Status: Married  Catering Manager Violence: Not At Risk (12/24/2023)   Humiliation, Afraid, Rape, and Kick questionnaire    Fear of Current or Ex-Partner: No    Emotionally Abused: No    Physically Abused: No    Sexually Abused: No    Review of Systems: See HPI, otherwise negative ROS  Physical Exam: Vital signs in last 24 hours: Temp:  [99 F (37.2 C)] 99 F (37.2 C) (11/04 0806) Pulse Rate:  [60] 60 (11/04 0806) Resp:  [16] 16 (11/04 0806) BP: (167)/(79) 167/79 (11/04 0806) SpO2:  [100 %] 100 % (11/04 0806) Weight:  [66.9 kg] 66.9 kg (11/04 0806)   General:   Alert,  Well-developed, well-nourished, pleasant and cooperative in NAD Head:  Normocephalic and atraumatic. Eyes:  Sclera clear, no icterus.   Conjunctiva pink. Ears:  Normal auditory acuity. Nose:  No deformity, discharge,  or lesions. Msk:  Symmetrical without gross deformities. Normal posture. Extremities:  Without clubbing or edema. Neurologic:  Alert and  oriented x4;  grossly normal neurologically. Skin:  Intact without significant lesions or rashes. Psych:  Alert and cooperative. Normal mood and affect.  Impression/Plan: Grant Young is a 78 y.o. male with coronary artery disease status post PCI on aspirin , chronic back pain/neuropathy, ascending aortic aneurysm follows with pain management who presents for evaluation of chronic progressive abdominal pain and screening colonoscopy (previous poor prep )      Proceed with upper endoscopy and colonoscopy   The risks of the procedure including infection, bleed, or perforation  as well as benefits, limitations, alternatives and imponderables have been reviewed with the patient. Questions have been answered. All parties agreeable.

## 2024-07-18 NOTE — Anesthesia Postprocedure Evaluation (Signed)
 Anesthesia Post Note  Patient: Grant Young  Procedure(s) Performed: COLONOSCOPY EGD (ESOPHAGOGASTRODUODENOSCOPY)  Patient location during evaluation: Phase II Anesthesia Type: General Level of consciousness: awake and alert Pain management: pain level controlled Vital Signs Assessment: post-procedure vital signs reviewed and stable Respiratory status: spontaneous breathing, nonlabored ventilation and respiratory function stable Cardiovascular status: stable Anesthetic complications: no   There were no known notable events for this encounter.   Last Vitals:  Vitals:   07/18/24 0806 07/18/24 1003  BP: (!) 167/79 124/60  Pulse: 60 62  Resp: 16 14  Temp: 37.2 C (!) 36.3 C  SpO2: 100% 100%    Last Pain:  Vitals:   07/18/24 1003  TempSrc: Axillary  PainSc: 0-No pain                 Ian Cavey L Deuntae Kocsis

## 2024-07-18 NOTE — Discharge Instructions (Signed)

## 2024-07-19 ENCOUNTER — Encounter (HOSPITAL_COMMUNITY): Payer: Self-pay | Admitting: Gastroenterology

## 2024-07-20 ENCOUNTER — Ambulatory Visit (INDEPENDENT_AMBULATORY_CARE_PROVIDER_SITE_OTHER): Payer: Self-pay | Admitting: Gastroenterology

## 2024-07-20 ENCOUNTER — Telehealth (INDEPENDENT_AMBULATORY_CARE_PROVIDER_SITE_OTHER): Payer: Self-pay | Admitting: Gastroenterology

## 2024-07-20 LAB — SURGICAL PATHOLOGY

## 2024-07-20 NOTE — Telephone Encounter (Signed)
 Please advice them to wait for now , as we just send in the referral to Dr Wilhelmenia ([facility])  and give sometime to review to see his availability . Also inform him the biopsies results are normal , I will be sending a letter at home . Thanks

## 2024-07-20 NOTE — Telephone Encounter (Signed)
 Pt left voicemail in regards to results and referral from [facility]. Pt states they found some inflammation and Dr.Ahmed was going to make a referral. Pt states that he would like to know if Dr.Ahmed decided to go through with referral for [facility] or if this will be done through [facility]. Pt would like name and number of provider so he can follow up with that provider. Please advise. Thank you!

## 2024-07-20 NOTE — Telephone Encounter (Signed)
 Pt contacted. Gave pt name and phone number of Dr.Mansouraty. Pt states he has had this pain for 6-8 months and wants to get this taken care of. Informed pt that once our referral coordinator gets back in the office, she will place the referral and when Dr.Mansouraty office receives the referral they will look over everything and give him a call to get him scheduled. Informed pt it could be a little bit of a wait. Also informed pt that his biopsies were normal. Pt states he was able to look over those today.

## 2024-07-21 ENCOUNTER — Telehealth: Payer: Self-pay

## 2024-07-21 ENCOUNTER — Other Ambulatory Visit: Payer: Self-pay

## 2024-07-21 DIAGNOSIS — K3189 Other diseases of stomach and duodenum: Secondary | ICD-10-CM

## 2024-07-21 NOTE — Telephone Encounter (Signed)
-----   Message from [facility] sent at 07/21/2024  6:14 AM EST ----- Schedule next available upper EUS to evaluate gastric body nodule. Thanks. GM ----- Message ----- From: Anitra Odetta CROME, RN Sent: 07/18/2024  10:35 AM EST To: Jenkins VEAR [place]; Aloha Wilhelmenia Raddle., MD   ----- Message ----- From: [place] Jenkins VEAR Sent: 07/18/2024  10:19 AM EST To: Odetta CROME Anitra, RN  Per Dr Cinderella, patient needs stomach EUS with Dr Wilhelmenia   Diagnosis: Anterior wall of stomach protruding lesion , Abdominal pain   Thanks Jenkins

## 2024-07-21 NOTE — Telephone Encounter (Signed)
 EUS has been set up for 08/31/24 at [facility] with GM at 245 pm

## 2024-07-21 NOTE — Telephone Encounter (Signed)
 EUS scheduled, pt instructed and medications reviewed.  Patient instructions mailed to home.  Patient to call with any questions or concerns.

## 2024-07-24 ENCOUNTER — Encounter: Payer: Self-pay | Admitting: Student in an Organized Health Care Education/Training Program

## 2024-07-24 NOTE — Telephone Encounter (Signed)
 Per Dr Wilhelmenia, schedule next available upper EUS to evaluate gastric body nodule. Grant Young will contact the patient to schedule procedure

## 2024-07-24 NOTE — Telephone Encounter (Signed)
 Patient requesting a call to discuss concerns he has regarding upcoming procedure. Please advise, thank you

## 2024-07-24 NOTE — Telephone Encounter (Signed)
 The pt is asking if he can be put on a cancellation list. I have made him aware that we can call if something opens up.  The pt has been advised of the information and verbalized understanding.

## 2024-07-24 NOTE — Telephone Encounter (Signed)
 Do we have anything to offer him?

## 2024-07-25 NOTE — Progress Notes (Signed)
 Patient result letter mailed procedure note and pathology result faxed to PCP

## 2024-07-26 ENCOUNTER — Encounter: Payer: Self-pay | Admitting: Nurse Practitioner

## 2024-07-26 ENCOUNTER — Ambulatory Visit: Attending: Nurse Practitioner | Admitting: Nurse Practitioner

## 2024-07-26 VITALS — BP 138/68 | HR 67 | Temp 97.2°F | Resp 18 | Ht 69.0 in | Wt 144.0 lb

## 2024-07-26 DIAGNOSIS — Z0289 Encounter for other administrative examinations: Secondary | ICD-10-CM | POA: Diagnosis present

## 2024-07-26 DIAGNOSIS — Z7189 Other specified counseling: Secondary | ICD-10-CM | POA: Diagnosis not present

## 2024-07-26 DIAGNOSIS — Z79899 Other long term (current) drug therapy: Secondary | ICD-10-CM | POA: Diagnosis present

## 2024-07-26 DIAGNOSIS — M25511 Pain in right shoulder: Secondary | ICD-10-CM | POA: Insufficient documentation

## 2024-07-26 DIAGNOSIS — G8929 Other chronic pain: Secondary | ICD-10-CM | POA: Diagnosis present

## 2024-07-26 DIAGNOSIS — G894 Chronic pain syndrome: Secondary | ICD-10-CM | POA: Insufficient documentation

## 2024-07-26 DIAGNOSIS — R1031 Right lower quadrant pain: Secondary | ICD-10-CM | POA: Diagnosis present

## 2024-07-26 MED ORDER — HYDROCODONE-ACETAMINOPHEN 10-325 MG PO TABS: 1.0000 | ORAL_TABLET | Freq: Two times a day (BID) | ORAL | 0 refills | Status: DC | PRN

## 2024-07-26 NOTE — Progress Notes (Signed)
 PROVIDER NOTE: Interpretation of information contained herein should be left to medically-trained personnel. Specific patient instructions are provided elsewhere under Patient Instructions section of medical record. This document was created in part using AI and STT-dictation technology, any transcriptional errors that may result from this process are unintentional.  Patient: Grant Young  Service: E/M   PCP: Grant Angeline LELON, NP  DOB: Feb 12, 1946  DOS: 07/26/2024  Provider: Emmy MARLA Blanch, NP  MRN: [id number]  Delivery: Face-to-face  Specialty: Interventional Pain Management  Type: Established Patient  Setting: Ambulatory outpatient facility  Specialty designation: 09  Referring Prov.: Grant Angeline LELON, NP  Location: Outpatient office facility       History of present illness (HPI) [name], a 78 y.o. year old male, is here today because of his Abdominal pain, chronic, right lower quadrant [R10.31, G89.29]. Grant Young primary complain today is Abdominal Pain  Pertinent problems: Grant Young  has Lumbar spondylosis and Chronic pain syndrome on their pertinent problem list.   Pain Assessment: Severity of Chronic pain is reported as a 9 /10. Location: Abdomen Mid/Denies. Onset: More than a month ago. Quality: Aching, Penetrating, Sharp. Timing: Constant. Modifying factor(s): Pain medication, heating pad, ice packs. Vitals:  height is 5' 9 (1.753 m) and weight is 144 lb (65.3 kg). His temporal temperature is 97.2 F (36.2 C) (abnormal). His blood pressure is 138/68 and his pulse is 67. His respiration is 18 and oxygen saturation is 97%.  BMI: Estimated body mass index is 21.27 kg/m as calculated from the following:   Height as of this encounter: 5' 9 (1.753 m).   Weight as of this encounter: 144 lb (65.3 kg).  Last encounter: Visit date not found. Last procedure: Visit date not found.  Reason for encounter: medication management.   Discussed the use of AI scribe software for clinical  note transcription with the patient, who gave verbal consent to proceed.  History of Present Illness   Grant Young is a 78 year old male who presents with abdominal pain and requests pain management.  He has a history of back pain previously managed with Norco (hydrocodone -acetaminophen ) 10 mg, taken two to three times a day as needed. Tramadol was not effective for him in the past.  Currently, he is experiencing abdominal pain and is seeking pain management to alleviate the discomfort until further diagnostic procedures, such as an ultrasound, are completed. He notes that previous prescriptions for pain management were not filled as expected, and he was instead given a medication called Gervanax, which he states is ineffective.     Pharmacotherapy Assessment   Norco 10-325 mg tablet every 12 hours as needed for pain Monitoring: [place] PMP: PDMP reviewed during this encounter.       Pharmacotherapy: No side-effects or adverse reactions reported. Compliance: No problems identified. Effectiveness: Clinically acceptable.  Grant Young, [place]  07/26/2024  8:29 AM  Sign when Signing Visit Safety precautions to be maintained throughout the outpatient stay will include: orient to surroundings, keep bed in low position, maintain call bell within reach at all times, provide assistance with transfer out of bed and ambulation.     UDS:  Summary  Date Value Ref Range Status  09/17/2020 Note  Final    Comment:    ==================================================================== Compliance Drug Analysis, Ur ==================================================================== Specimen Alert Note: Urinary creatinine is low; ability to detect some drugs may be compromised. Interpret results with caution. (Creatinine) ==================================================================== Test  Result       Flag       Units  Drug Present and Declared for Prescription  Verification   Lorazepam                       1107         EXPECTED   ng/mg creat    Source of lorazepam  is a scheduled prescription medication.    Butalbital                      PRESENT      EXPECTED   Citalopram                     PRESENT      EXPECTED   Desmethylcitalopram            PRESENT      EXPECTED    Desmethylcitalopram is an expected metabolite of citalopram or the    enantiomeric form, escitalopram .    Acetaminophen                   PRESENT      EXPECTED   Propranolol                     PRESENT      EXPECTED  Drug Present not Declared for Prescription Verification   Oxazepam                       300          UNEXPECTED ng/mg creat   Temazepam                      357          UNEXPECTED ng/mg creat    Oxazepam and temazepam are expected metabolites of diazepam .    Oxazepam is also an expected metabolite of other benzodiazepine    drugs, including chlordiazepoxide, prazepam, clorazepate, halazepam,    and temazepam.  Oxazepam and temazepam are available as scheduled    prescription medications.  Drug Absent but Declared for Prescription Verification   Hydrocodone                     Not Detected UNEXPECTED ng/mg creat   Tizanidine                      Not Detected UNEXPECTED    Tizanidine , as indicated in the declared medication list, is not    always detected even when used as directed.    Amitriptyline                   Not Detected UNEXPECTED   Prochlorperazine                Not Detected UNEXPECTED   Salicylate                     Not Detected UNEXPECTED    Aspirin , as indicated in the declared medication list, is not always    detected even when used as directed.  ==================================================================== Test                      Result    Flag   Units      Ref Range   Creatinine              14  LL     mg/dL      >=79 ==================================================================== Declared Medications:  The flagging and  interpretation on this report are based on the  following declared medications.  Unexpected results may arise from  inaccuracies in the declared medications.   **Note: The testing scope of this panel includes these medications:   Amitriptyline  (Elavil )  Butalbital  (Fioricet )  Butalbital  (Fiorinal )  Escitalopram  (Lexapro )  Hydrocodone  (Norco)  Lorazepam  (Ativan )  Prochlorperazine  (Compazine )  Propranolol  (Inderal )   **Note: The testing scope of this panel does not include small to  moderate amounts of these reported medications:   Acetaminophen  (Fioricet )  Acetaminophen  (Norco)  Aspirin  (Fiorinal )  Tizanidine  (Zanaflex )   **Note: The testing scope of this panel does not include the  following reported medications:   Anastrozole  (Arimidex )  Caffeine  (Fioricet )  Caffeine  (Fiorinal )  Chlorthalidone  (Hygroton )  Cholecalciferol   Cholestyramine  (Questran )  Dicyclomine  (Bentyl )  Ezetimibe  (Zetia )  Hydrocortisone   Melatonin  Ondansetron  (Zofran )  Potassium (Klor-Con )  Rabeprazole  (Aciphex )  Sucralfate  (Carafate )  Valacyclovir  (Valtrex )  Vitamin B12 ==================================================================== For clinical consultation, please call [phone]. ====================================================================     No results found for: CBDTHCR No results found for: D8THCCBX No results found for: D9THCCBX  ROS  Constitutional: Denies any fever or chills Gastrointestinal: No reported hemesis, hematochezia, vomiting, or acute GI distress Musculoskeletal: Abdominal pain Neurological: No reported episodes of acute onset apraxia, aphasia, dysarthria, agnosia, amnesia, paralysis, loss of coordination, or loss of consciousness  Medication Review  Cholecalciferol , DHEA, HYDROcodone -acetaminophen , Hyoscyamine Sulfate SL, Magnesium  Bisglycinate, NONFORMULARY OR COMPOUNDED ITEM, Nutritional Supplements, Perfluorohexyloctane, Probiotic Product,  RABEprazole , Vibegron , Vitamin B-12, anastrozole , aspirin  EC, benzonatate , cholestyramine , escitalopram , ezetimibe -simvastatin , guaiFENesin -codeine, hydrALAZINE , ipratropium, ketoconazole, linaclotide , loperamide , meclizine , melatonin, ondansetron , polyethylene glycol-electrolytes, propranolol , sodium fluoride, testosterone  cypionate, traZODone , triamcinolone cream, and valACYclovir   History Review  Allergy: Grant Young is allergic to ace inhibitors, fluoxetine, metoclopramide, nalbuphine, other, amoxicillin-pot clavulanate, doxazosin, duloxetine, penicillins, tamsulosin, trazodone  and nefazodone, alirocumab , amlodipine, cinoxacin, ciprofloxacin, fludrocortisone , nebivolol, olanzapine, olmesartan, pregabalin, prostaglandins, thyroid  hormones, venlafaxine , venlafaxine  hcl er, zolpidem, duloxetine hcl, fluoxetine hcl, nucynta  [tapentadol ], and phenytoin. Drug: Grant Young  reports that he does not currently use drugs. Alcohol:  reports current alcohol use. Tobacco:  reports that he has never smoked. He has never been exposed to tobacco smoke. He has never used smokeless tobacco. Social: Grant Young  reports that he has never smoked. He has never been exposed to tobacco smoke. He has never used smokeless tobacco. He reports current alcohol use. He reports that he does not currently use drugs. Medical:  has a past medical history of Allergy, Anemia, Aneurysm of ascending aorta, Anxiety, Aortic atherosclerosis, Cataract (2015), Chronic back pain, Chronic kidney disease (1980's), Clotting disorder (01-2022), Coronary artery disease, Depression, Erectile dysfunction, GERD (gastroesophageal reflux disease), h/o Lyme disease, Headache, Hiatal hernia, History of bilateral cataract extraction (01/2021), History of gastritis, History of kidney stones, History of neuropathy, Hyperlipidemia LDL goal <70, Hypertension, Insomnia, Left lower lobe pulmonary nodule, Long term current use of antithrombotics/antiplatelets, Long term  current use of aromatase inhibitor, Lumbar radicular pain, Myocardial infarction [facility]), NSTEMI (non-ST elevated myocardial infarction) (HCC) (11/27/2021), Overactive bladder, Pneumonia, PONV (postoperative nausea and vomiting), Recurrent herpes labialis, Skin cancer, basal cell, Spinal cord stimulator status, Squamous cell skin cancer, Substance abuse (HCC), and Syncope and collapse (2022). Surgical: Grant Young  has a past surgical history that includes Kidney stone surgery (09/14/1980); Kidney stone surgery (09/15/1995); Lithotripsy (09/14/2014); Lithotripsy (09/14/1996); Lithotripsy (09/14/1997); Gallbladder surgery (09/15/2015); Sigmoidoscopy (09/14/2017); Colonoscopy (09/15/2015); Cholecystectomy (2017); Shoulder surgery (Right, 1984); Back surgery;  Spinal cord stimulator implant; Pain pump implantation; Pain pump removal; Tonsillectomy; Foot surgery (Bilateral, 03/18/2020); Foot surgery (08/032021); Upper gi endoscopy; Colonoscopy with propofol  (N/A, 10/03/2020); Esophagogastroduodenoscopy (egd) with propofol  (N/A, 10/03/2020); Cataract extraction w/PHACO (Left, 01/14/2021); Cataract extraction w/PHACO (Right, 01/28/2021); Coronary/Graft Acute MI Revascularization (N/A, 11/27/2021); LEFT HEART CATH AND CORONARY ANGIOGRAPHY (N/A, 11/27/2021); Excision basal cell carcinoma; transthoracic echocardiogram (11/28/2021); Video bronchoscopy with endobronchial ultrasound (N/A, 01/15/2023); Esophagogastroduodenoscopy (egd) with propofol  (N/A, 07/27/2023); biopsy (07/27/2023); Colonoscopy (N/A, 01/26/2024); Coronary angioplasty (1998, 2023); Spine surgery (1999, 2000); Cystoscopy/ureteroscopy/holmium laser/stent placement (Left, 06/26/2024); Colonoscopy (N/A, 07/18/2024); and Esophagogastroduodenoscopy (N/A, 07/18/2024). Family: family history includes Anxiety disorder in his daughter; Dementia in his mother; Diabetes in his mother; Heart disease in his father; Heart failure in his father; Hypertension in his father; Kidney  Stones in his daughter; OCD in his daughter; Stroke in his father and mother; Vision loss in his father.  Laboratory Chemistry Profile   Renal Lab Results  Component Value Date   BUN 14 06/18/2024   CREATININE 0.77 06/18/2024   BCR SEE NOTE: 04/28/2024   GFRAA 101 05/20/2020   GFRNONAA >60 06/18/2024    Hepatic Lab Results  Component Value Date   AST 23 06/18/2024   ALT 19 06/18/2024   ALBUMIN 4.0 06/18/2024   ALKPHOS 74 06/18/2024   AMYLASE 32 09/18/2022   LIPASE 37 06/18/2024    Electrolytes Lab Results  Component Value Date   NA 143 06/18/2024   K 3.9 06/18/2024   CL 106 06/18/2024   CALCIUM  9.4 06/18/2024   MG 2.3 05/30/2024   PHOS 3.6 11/30/2021    Bone Lab Results  Component Value Date   VD25OH 55.7 07/06/2023   TESTOSTERONE  33.2 05/20/2020    Inflammation (CRP: Acute Phase) (ESR: Chronic Phase) Lab Results  Component Value Date   CRP 1 07/06/2023   ESRSEDRATE 2 07/06/2023   LATICACIDVEN 0.8 06/18/2024         Note: Above Lab results reviewed.  Recent Imaging Review  CT Angio Abd/Pel w/ and/or w/o CLINICAL DATA:  Chronic progressive abdominal pain and history coronary artery disease and thoracic aortic aneurysmal disease. Concern for potential chronic mesenteric ischemia.  EXAM: CT ANGIOGRAPHY ABDOMEN AND PELVIS WITH CONTRAST  TECHNIQUE: Multidetector CT imaging of the abdomen and pelvis was performed using the standard protocol during bolus administration of intravenous contrast. Multiplanar reconstructed images and MIPs were obtained and reviewed to evaluate the vascular anatomy.  RADIATION DOSE REDUCTION: This exam was performed according to the departmental dose-optimization program which includes automated exposure control, adjustment of the mA and/or kV according to patient size and/or use of iterative reconstruction technique.  CONTRAST:  OMNIPAQUE  IOHEXOL  350 MG/ML SOLN  COMPARISON:  CT abdomen pelvis without contrast  06/18/2024 and with contrast 05/30/2024. CT chest 03/31/2024.  FINDINGS: VASCULAR  Aorta: Normal caliber abdominal aorta demonstrating mild atherosclerosis. No evidence aneurysmal disease or dissection.  Celiac: Mild atherosclerosis at origin. No significant stenosis. Normally patent branch vessels and branching anatomy.  SMA: Normally patent. Accessory artery supplying the inferior aspect of the right lobe of the liver.  Renals: Normally patent single left and paired right renal arteries.  IMA: Normally patent.  In  Inflow: N through to [facility] patent bilateral iliac arteries. No significant obstructive disease or evidence of aneurysmal disease.  Proximal Outflow: Normally patent bilateral common femoral arteries and femoral bifurcations.  Veins: Venous phase imaging demonstrates normal patency venous structures in the abdomen and pelvis.  Review of the MIP images confirms the above findings.  NON-VASCULAR  Lower chest: Chronic lung disease and chronic waxing and waning bilateral parenchymal nodularity in a bronchovascular distribution has been well documented on multiple prior chest CT exams. Ill-defined nodularity remains at both lung bases. No pleural effusions. Small hiatal hernia.  Hepatobiliary: No focal liver abnormality is seen. No gallstones, gallbladder wall thickening, or biliary dilatation.  Pancreas: Unremarkable. No pancreatic ductal dilatation or surrounding inflammatory changes.  Spleen: Normal in size without focal abnormality.  Adrenals/Urinary Tract: Adrenal glands are unremarkable. Stable nonobstructing calculi in both kidneys. No renal lesions or hydronephrosis. Bladder is unremarkable.  Stomach/Bowel: Bowel shows no evidence of obstruction, ileus, inflammation or lesion. The appendix is not discretely visualized. No free intraperitoneal air.  Lymphatic: No enlarged lymph nodes are identified in the abdomen or pelvis.  Reproductive:  Prostate is unremarkable.  Other: No abdominal wall hernia or abnormality. No abdominopelvic ascites.  Musculoskeletal: Status post lumbar fusion at the L5-S1 level. Spinal stimulator present.  IMPRESSION: 1. No evidence of significant mesenteric arterial occlusive disease. 2. Mild atherosclerosis of the abdominal aorta and iliac arteries without evidence of aneurysmal disease or dissection. 3. Chronic lung disease and chronic waxing and waning bilateral parenchymal nodularity in a bronchovascular distribution has been well documented on multiple prior chest CT exams. Ill-defined nodularity remains at both lung bases. 4. Small hiatal hernia. 5. Stable nonobstructing calculi in both kidneys.  Electronically Signed   By: Marcey Moan M.D.   On: 06/27/2024 11:39 Note: Reviewed        Physical Exam  Vitals: BP 138/68 (BP Location: Right Arm, Patient Position: Sitting, Cuff Size: Normal)   Pulse 67   Temp (!) 97.2 F (36.2 C) (Temporal)   Resp 18   Ht 5' 9 (1.753 m)   Wt 144 lb (65.3 kg)   SpO2 97%   BMI 21.27 kg/m  BMI: Estimated body mass index is 21.27 kg/m as calculated from the following:   Height as of this encounter: 5' 9 (1.753 m).   Weight as of this encounter: 144 lb (65.3 kg). Ideal: Ideal body weight: 70.7 kg (155 lb 13.8 oz) General appearance: Well nourished, well developed, and well hydrated. In no apparent acute distress Mental status: Alert, oriented x 3 (person, place, & time)       Respiratory: No evidence of acute respiratory distress Eyes: PERLA  Musculoskeletal: Abdominal pain (chronic) (RLQ) Assessment   Diagnosis Status  1. Abdominal pain, chronic, right lower quadrant   2. Right lower quadrant abdominal pain   3. Chronic pain syndrome   4. Chronic right shoulder pain   5. Pain management contract signed   6. Pain management contract discussed   7. Medication management    Controlled Controlled Controlled   Updated Problems: No  problems updated.  Plan of Care  Problem-specific:  Assessment and Plan    Chronic right lower quadrant abdominal pain syndrome Chronic abdominal pain. Previous Journavx  treatment ineffective. Awaiting GI evaluation for underlying cause and potential intervention. Pain management required until GI assessment. - Prescribed hydrocodone  IR 10 mg BID PRN for pain. - Provided 30-day hydrocodone  prescription. - Provided pain contract for narcotic use. - Routine UDS ordered today. - Scheduled follow-up in one month to review pain management and medication efficacy.       Grant Young has a current medication list which includes the following long-term medication(s): escitalopram , ezetimibe -simvastatin , hydralazine , ipratropium, propranolol , rabeprazole , testosterone  cypionate, trazodone , and linaclotide .  Pharmacotherapy (Medications Ordered): Meds ordered this encounter  Medications   HYDROcodone -acetaminophen  (  NORCO) 10-325 MG tablet    Sig: Take 1 tablet by mouth every 12 (twelve) hours as needed.    Dispense:  60 tablet    Refill:  0   Orders:  Orders Placed This Encounter  Procedures   Drug Screen 10 W/Conf, Serum    Release to patient:   Immediate        Return in about 1 month (around 08/25/2024) for (F2F), (MM), Grant Blanch NP.    Recent Visits Date Type Provider Dept  06/06/24 Office Visit Marcelino Nurse, MD [facility]  Showing recent visits within past 90 days and meeting all other requirements Today's Visits Date Type Provider Dept  07/26/24 Office Visit Lyndsee Casa K, NP [facility]  Showing today's visits and meeting all other requirements Future Appointments Date Type Provider Dept  08/23/24 Appointment Neema Fluegge K, NP [facility]  Showing future appointments within next 90 days and meeting all other requirements  I discussed the assessment and treatment plan with the patient. The patient was provided an opportunity to  ask questions and all were answered. The patient agreed with the plan and demonstrated an understanding of the instructions.  Patient advised to call back or seek an in-person evaluation if the symptoms or condition worsens.  I personally spent a total of 30 minutes in the care of the patient today including preparing to see the patient, getting/reviewing separately obtained history, performing a medically appropriate exam/evaluation, counseling and educating, placing orders, referring and communicating with other health care professionals, documenting clinical information in the EHR, independently interpreting results, communicating results, and coordinating care.   Note by: Keaundra Stehle K Caeden Foots, NP (TTS and AI technology used. I apologize for any typographical errors that were not detected and corrected.) Date: 07/26/2024; Time: 10:14 AM

## 2024-07-26 NOTE — Progress Notes (Signed)
 Safety precautions to be maintained throughout the outpatient stay will include: orient to surroundings, keep bed in low position, maintain call bell within reach at all times, provide assistance with transfer out of bed and ambulation.

## 2024-08-09 LAB — BENZODIAZEPINES,MS,WB/SP RFX

## 2024-08-09 LAB — DRUG SCREEN 10 W/CONF, SERUM
Amphetamines, IA: NEGATIVE ng/mL
Barbiturates, IA: NEGATIVE ug/mL
Cocaine & Metabolite, IA: NEGATIVE ng/mL
Methadone, IA: NEGATIVE ng/mL
Opiates, IA: POSITIVE ng/mL — AB
Oxycodones, IA: NEGATIVE ng/mL
Phencyclidine, IA: NEGATIVE ng/mL
Propoxyphene, IA: NEGATIVE ng/mL
THC(Marijuana) Metabolite, IA: NEGATIVE ng/mL

## 2024-08-09 LAB — OXYCODONES,MS,WB/SP RFX
Oxycocone: NEGATIVE ng/mL
Oxycodones Confirmation: NEGATIVE
Oxymorphone: NEGATIVE ng/mL

## 2024-08-09 LAB — OPIATES,MS,WB/SP RFX
6-Acetylmorphine: NEGATIVE
Codeine: NEGATIVE ng/mL
Dihydrocodeine: 1.6 ng/mL
Hydrocodone: 16.2 ng/mL
Hydromorphone: NEGATIVE ng/mL
Morphine: NEGATIVE ng/mL
Opiate Confirmation: POSITIVE

## 2024-08-18 ENCOUNTER — Encounter: Payer: Self-pay | Admitting: Internal Medicine

## 2024-08-18 MED ORDER — TRAZODONE HCL 100 MG PO TABS: 100.0000 mg | ORAL_TABLET | Freq: Every day | ORAL | 0 refills | Status: AC

## 2024-08-21 MED ORDER — DICYCLOMINE HCL 10 MG PO CAPS: 10.0000 mg | ORAL_CAPSULE | Freq: Three times a day (TID) | ORAL | 0 refills | Status: DC

## 2024-08-21 NOTE — Addendum Note (Signed)
 Addended by: ANTONETTE ANGELINE ORN on: 08/21/2024 09:13 AM   Modules accepted: Orders

## 2024-08-22 ENCOUNTER — Encounter (INDEPENDENT_AMBULATORY_CARE_PROVIDER_SITE_OTHER): Payer: Self-pay | Admitting: Gastroenterology

## 2024-08-22 ENCOUNTER — Ambulatory Visit

## 2024-08-22 ENCOUNTER — Ambulatory Visit (INDEPENDENT_AMBULATORY_CARE_PROVIDER_SITE_OTHER): Admitting: Gastroenterology

## 2024-08-22 ENCOUNTER — Telehealth (INDEPENDENT_AMBULATORY_CARE_PROVIDER_SITE_OTHER): Payer: Self-pay | Admitting: Gastroenterology

## 2024-08-22 ENCOUNTER — Ambulatory Visit
Admission: EM | Admit: 2024-08-22 | Discharge: 2024-08-22 | Disposition: A | Discharge status: Home / Self Care | Attending: Emergency Medicine | Admitting: Emergency Medicine

## 2024-08-22 VITALS — BP 125/57 | HR 54 | Temp 98.9°F | Ht 69.0 in | Wt 144.2 lb

## 2024-08-22 DIAGNOSIS — N390 Urinary tract infection, site not specified: Secondary | ICD-10-CM | POA: Diagnosis not present

## 2024-08-22 DIAGNOSIS — K219 Gastro-esophageal reflux disease without esophagitis: Secondary | ICD-10-CM

## 2024-08-22 DIAGNOSIS — K3189 Other diseases of stomach and duodenum: Secondary | ICD-10-CM | POA: Insufficient documentation

## 2024-08-22 DIAGNOSIS — K581 Irritable bowel syndrome with constipation: Secondary | ICD-10-CM

## 2024-08-22 DIAGNOSIS — R1013 Epigastric pain: Secondary | ICD-10-CM

## 2024-08-22 LAB — POCT URINE DIPSTICK
Bilirubin, UA: NEGATIVE
Glucose, UA: NEGATIVE mg/dL
Ketones, POC UA: NEGATIVE mg/dL
Nitrite, UA: POSITIVE — AB
Protein Ur, POC: 100 mg/dL — AB
Spec Grav, UA: 1.02 (ref 1.010–1.025)
Urobilinogen, UA: 0.2 U/dL
pH, UA: 6.5 (ref 5.0–8.0)

## 2024-08-22 MED ORDER — CEPHALEXIN 500 MG PO CAPS: 500.0000 mg | ORAL_CAPSULE | Freq: Three times a day (TID) | ORAL | 0 refills | Status: AC

## 2024-08-22 MED ORDER — RABEPRAZOLE SODIUM 20 MG PO TBEC: 20.0000 mg | DELAYED_RELEASE_TABLET | Freq: Every day | ORAL | 3 refills | Status: AC

## 2024-08-22 MED ORDER — CEPHALEXIN 500 MG PO CAPS: 500.0000 mg | ORAL_CAPSULE | Freq: Three times a day (TID) | ORAL | 0 refills | Status: DC

## 2024-08-22 MED ORDER — LINACLOTIDE 72 MCG PO CAPS: 72.0000 ug | ORAL_CAPSULE | Freq: Every day | ORAL | 1 refills | Status: AC

## 2024-08-22 MED ORDER — LINACLOTIDE 72 MCG PO CAPS: 72.0000 ug | ORAL_CAPSULE | Freq: Every day | ORAL | Status: DC

## 2024-08-22 NOTE — Progress Notes (Signed)
 Bertrand Vowels Faizan Kmari Brian , M.D. Gastroenterology & Hepatology [facility] G[facility]enter[facility] [address] [place], [place] [address] Primary Care Physician: Antonette Angeline ORN, NP [address] [place] [address]  Chief Complaint: Abdominal pain  History of Present Illness:  Grant Young is a 78 y.o. male with coronary artery disease status post PCI on aspirin , chronic back pain/neuropathy, ascending aortic aneurysm follows with pain management who presents for evaluation of chronic progressive abdominal pain With  Patient reports he continues to have epigastric pain mostly during morning times.  No aggravating factors but relieved with heating pad to the abdominal wall.  Have not noticed any relationship with food intake.  Patient is having bowel movements daily is not taking Linzess  but takes MiraLAX  daily  Previous history   Patient was last seen by Dr. Therisa 09/2023, patient had underwent upper endoscopy last year and colonoscopy 01/2024 (inadequate prep; patient only drank the prep ) . Patient reports symptoms beginning November 2024 and since then has been progressive.  He reports pain is located at the periumbilical area and would radiate to lower abdomen.  Reports pain is worse in an empty stomach and relieves with food intake.  Patient had been to ER multiple times.  Patient had a CT abdomen and CT head not explain patient's persistent symptoms/ Patient was previously on Aciphex  with good control of his symptoms but despite taking it twice daily patient now continues to be symptomatic Patient was previously given Linzess  72 mcg but due to diarrhea had to stop. As per the patient he had tried Bentyl , Levsin and IBgard without any relief abdominal pain  07/2024  - Diverticulosis in the left colon. - Non- bleeding internal hemorrhoids. - No specimens collected.  07/2024  - 2 cm hiatal hernia. - Gastritis. Biopsied. - 3- 4 cm Extrinsic compression in the anterior  wall of the stomach. ( Appears to be mobile with respiration and non pulsating) - Normal duodenal bulb and second portion of the duodenum. Biopsied.  A. SMALL BOWEL, BIOPSY:  Duodenal mucosa with normal villous architecture.  No villous atrophy or increased intraepithelial lymphocytes.   B. GASTRIC, BIOPSY:  Gastric antral and oxyntic mucosa with hyperemia.  Negative for Helicobacter pylori.   01/2024 colonoscopy - Preparation of the colon was inadequate. - Stool in the entire examined colon. - No specimens collected.    10/03/2020: EGD: Normal study, colonoscopy: Internal hemorrhoids noted medium in size.  No polyps seen random colon biopsies taken which were negative for microscopic colitis.  Biopsies of the terminal ileum were also normal.    3 rounds of hemorrhoidal banding completed in October 2022  07/27/2023: EGD: 3 non bleeding gastric ulcers small in size seen at the pre pyloric area- bx taken -acute gastritis- no H pylori   Surgical: Cholecystectomy  Recent lab work with normal liver enzymes negative alpha gal hemoglobin 15.4 Ferritin : 23 (03/2024)  Past Medical History: 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   Kidney Stones   Clotting disorder 01-2022   Plavix    Coronary artery disease    a.) s/p PCI with DES x 2 (pLCx and  o-mLAD) 11/27/2021   Depression    Erectile dysfunction    a.) Bi-mix (papaverine + phentolamine) injections + exogenous testosterone  injections   GERD (gastroesophageal reflux disease)    h/o Lyme disease    Headache    Hiatal hernia    History of bilateral cataract extraction 01/2021   History of gastritis    History of kidney stones    History of neuropathy    Hyperlipidemia LDL goal  <70    a.) CAD with NSTEMI -->  intolerant of statins with statin myopathy and memory issues.;  Also intolerant of Repatha    Hypertension    Labile blood pressures but dizziness with blood pressures in the normal range; intolerant of most medications including ARB's, ACE inhibitor's, amlodipine most beta-blockers other than propranolol .   Insomnia    a.) takes malatonin PRN   Left lower lobe pulmonary nodule    a.) chest CT 12/07/2022: nodules x 2 posteromedial LLL;  8 x 11 mm and 8 x 10 mm   Long term current use of antithrombotics/antiplatelets    a.) DAPT (ASA + clopidogrel )   Long term current use of aromatase inhibitor    a,) anastrozole  --> estridiol suppression secondary to exogenous testosterone  use   Lumbar radicular pain    Myocardial infarction (HCC)    a.) MI x 2 - 1990 * 1999 - PTCA   NSTEMI (non-ST elevated myocardial infarction) (HCC) 11/27/2021   a.) LHC 11/28/2021: sequential 75% o-pLAD, 70% OM2, 80% p-mLAD, 99% mLAD, 100% pLCx --> PCI placing a 2.5 x 26 mm Onyx Frontier DES to pLCx and a 2.5 x 24 mm Onyx Frontier DES to the o-m LAD (covering 3 lesions)   Overactive bladder    Pneumonia    PONV (postoperative nausea and vomiting)    Recurrent herpes labialis    a.) has suppressive valacyclovir  to use PRN   Skin cancer, basal cell    Spinal cord stimulator status    01/08/21 - not currently using.   Squamous cell skin cancer    Substance abuse (HCC)    Syncope and collapse 2022    Past Surgical History: Past Surgical History:  Procedure Laterality Date   BACK SURGERY     BASAL CELL CARCINOMA EXCISION     BIOPSY  07/27/2023   Procedure: BIOPSY;  Surgeon: Therisa Bi, Grant Young;  Location: [facility] ENDOSCOPY;  Service: Gastroenterology;;   CATARACT EXTRACTION W/PHACO Left 01/14/2021   Procedure: CATARACT EXTRACTION PHACO AND INTRAOCULAR LENS PLACEMENT (IOC) LEFT VIVITY TORIC LENS 8.75 00:56.7;  Surgeon: Jaye Fallow, Grant Young;  Location: [facility];  Service:  Ophthalmology;  Laterality: Left;   CATARACT EXTRACTION W/PHACO Right 01/28/2021   Procedure: CATARACT EXTRACTION PHACO AND INTRAOCULAR LENS PLACEMENT (IOC) RIGHT VIVITY TORIC LENS;  Surgeon: Jaye Fallow, Grant Young;  Location: [facility];  Service: Ophthalmology;  Laterality: Right;  6.54 00:46.4   CHOLECYSTECTOMY  2017   COLONOSCOPY  09/15/2015   Per New Patient Packet   COLONOSCOPY N/A 01/26/2024   Procedure: COLONOSCOPY;  Surgeon: Therisa Bi, Grant Young;  Location: [facility] ENDOSCOPY;  Service: Gastroenterology;  Laterality: N/A;   COLONOSCOPY N/A 07/18/2024   Procedure: COLONOSCOPY;  Surgeon: Cinderella Deatrice FALCON, Grant Young;  [facility];  Service: Endoscopy;  Laterality: N/A;  9:00am, ASA 1-2   COLONOSCOPY WITH PROPOFOL  N/A 10/03/2020   Procedure: COLONOSCOPY WITH PROPOFOL ;  Surgeon: Therisa Bi, Grant Young;  Location: [facility] ENDOSCOPY;  Service: Gastroenterology;  Laterality: N/A;   CORONARY ANGIOPLASTY  1998, 2023   CORONARY/GRAFT ACUTE  MI REVASCULARIZATION N/A 11/27/2021   Procedure: Coronary/Graft Acute MI Revascularization;  Surgeon: Anner Alm ORN, Grant Young;  Location: [facility] CATH: (NSTEMI) - > 100% prox-mid LCx (Onyx Frontier DES 2.5 x 26 -> 2.7 mm, Ost  OM1 65%.  Ost-mid LAD 3 lesions 75%, 90%, 99% => DES PCI Onyx Frontier DES 2.5 x 34 -> 2.8 mm   CYSTOSCOPY/URETEROSCOPY/HOLMIUM LASER/STENT PLACEMENT Left 06/26/2024   Procedure: CYSTOSCOPY/URETEROSCOPY/HOLMIUM LASER/STENT PLACEMENT;  Surgeon: Francisca Redell BROCKS, Grant Young;  Location: [facility] ORS;  Service: Urology;  Laterality: Left;   ESOPHAGOGASTRODUODENOSCOPY N/A 07/18/2024   Procedure: EGD (ESOPHAGOGASTRODUODENOSCOPY);  Surgeon: Cinderella Deatrice FALCON, Grant Young;  [facility];  Service: Endoscopy;  Laterality: N/A;   ESOPHAGOGASTRODUODENOSCOPY (EGD) WITH PROPOFOL  N/A 10/03/2020   Procedure: ESOPHAGOGASTRODUODENOSCOPY (EGD) WITH PROPOFOL ;  Surgeon: Therisa Bi, Grant Young;  Location: [facility] ENDOSCOPY;  Service: Gastroenterology;  Laterality: N/A;    ESOPHAGOGASTRODUODENOSCOPY (EGD) WITH PROPOFOL  N/A 07/27/2023   Procedure: ESOPHAGOGASTRODUODENOSCOPY (EGD) WITH PROPOFOL ;  Surgeon: Therisa Bi, Grant Young;  Location: [facility] ENDOSCOPY;  Service: Gastroenterology;  Laterality: N/A;   FOOT SURGERY Bilateral 03/18/2020   FOOT SURGERY  08/032021   GALLBLADDER SURGERY  09/15/2015   Gallbladder Removal. Procedure done by Dr.Beverly. Per New Patient Packet   KIDNEY STONE SURGERY  09/14/1980   Too many to count. Per New Patient Packet 09/14/1980-09/15/1995   KIDNEY STONE SURGERY  09/15/1995   Too many to count. Per New Patient Packet   LEFT HEART CATH AND CORONARY ANGIOGRAPHY N/A 11/27/2021   Procedure: LEFT HEART CATH AND CORONARY ANGIOGRAPHY;  Surgeon: Anner Alm ORN, Grant Young;  Location: [facility] CATH:  NSTEMI - 2 V CAD: 100% thrombotic prox-mid LCx (DES PCI), ost OM1 65%; Ost LAD 75% - prox LAD 90%, prox-mid 99% (DES PCI).  MIld diffuse RCA disease - calcicifed.  R-L collaterals filling LCx. EF ~45-50% with lateral HK. LVEDP 28 mmHg   LITHOTRIPSY  09/14/2014   Per New Patient Packet   LITHOTRIPSY  09/14/1996   No Stints Used. Per New Patient Packet   LITHOTRIPSY  09/14/1997   No Stints used. Per New Patient Packet   PAIN PUMP IMPLANTATION     PAIN PUMP REMOVAL     SHOULDER SURGERY Right 1984   SIGMOIDOSCOPY  09/14/2017   Per New Patient Packet   SPINAL CORD STIMULATOR IMPLANT     SPINE SURGERY  1999, 2000   Fusion, Inst. Fusion   TONSILLECTOMY     TRANSTHORACIC ECHOCARDIOGRAM  11/28/2021   (in setting of non-STEMI): EF 50 to 55%.  Suspect inferior and lateral hypokinesis.  Indeterminate diastolic parameters.  Normal RV size and function.  Normal RAP.  Normal aortic and mitral valves with only mild MR and AI.  Normal RVP and RAP.   UPPER GI ENDOSCOPY     VIDEO BRONCHOSCOPY WITH ENDOBRONCHIAL ULTRASOUND N/A 01/15/2023   Procedure: VIDEO BRONCHOSCOPY WITH ENDOBRONCHIAL ULTRASOUND;  Surgeon: Parris Manna, Grant Young;  Location: [facility] ORS;  Service: Thoracic;   Laterality: N/A;    Family History: Family History  Problem Relation Age of Onset   Heart disease Father    Heart failure Father    Hypertension Father    Stroke Father    Vision loss Father    Stroke Mother    Dementia Mother    Diabetes Mother    Kidney Stones Daughter    Anxiety disorder Daughter    OCD Daughter     Social History: Social History   Tobacco Use  Smoking Status Never   Passive exposure: Never  Smokeless Tobacco  Never   Social History   Substance and Sexual Activity  Alcohol Use Yes   Comment: 1 Drink a Month, socially   Social History   Substance and Sexual Activity  Drug Use Not Currently    Allergies: Allergies  Allergen Reactions   Ace Inhibitors     Other reaction(s): Cough   Fluoxetine Anxiety    made me fall asleep per pt bad headaches and makes  Me  Crazy historical allergy noted in [employer]   Metoclopramide     Other reaction(s): Other (See Comments), Other (See Comments), Unknown Tardive Dyskinesia  historical allergy noted in [employer]   Nalbuphine     Used Post Back surgery- Anesthesiologist Error. Patient had Narcotic Withdraw. Per New Patient Packet.    Other     Other reaction(s): Other (See Comments) Altered mental status in combo with narcotics at previous hospitalization - Full Withdrawal Symptoms Other reaction(s): Rash   Amoxicillin-Pot Clavulanate Nausea Only    Per New Patient Packet.   Doxazosin Rash    Other reaction(s): Other - See Comments, Rash     Duloxetine Nausea Only    Per New Patient Packet.    Penicillins Nausea Only    Per New Patient Packet.   Tamsulosin Itching and Anxiety    Restless, Flushing, Heavy Chest, Itching, Hyperactive mood and Anxiety. Unable to handle side effects. Per New Patient Packet.     Trazodone  And Nefazodone Itching, Anxiety and Rash    Headache. INCREASED MY ANXIETY AND HEARTRATE Flushing, tachycardia INCREASED MY ANXIETY AND HEARTRATE Per New Patient  Packet.    Alirocumab  Other (See Comments)   Amlodipine     Shaking, unsure of reaction. Per New Patient Packet.     Cinoxacin     GI Intolerance, and Dizziness. Per New Patient Packet.    Ciprofloxacin     Other reaction(s): Unknown   Fludrocortisone  Other (See Comments)    Worsening headaches, GI issues, fatigue   Nebivolol     Other reaction(s): Unknown   Olanzapine     Headache and unable to sleep for 3 nights. Per New Patient Packet.    Olmesartan     Other reaction(s): Unknown   Pregabalin     Confusion, Lack of concentration, dizziness, and likely drowsiness. Per New Patient Packet.     Prostaglandins     Other reaction(s): Other (See Comments) Intolerance   Thyroid  Hormones     Other reaction(s): Other (See Comments) Thyroid  (Nature Thyroid ) contraindicated with some of your other medications.   Venlafaxine  Other (See Comments)    High blood pressure- hospitalized   venlafaxine    Venlafaxine  Hcl Er Hypertension   Zolpidem     Nightmares, Ineffective after 2 days. Per New Patient Packet.    Duloxetine Hcl     Other reaction(s): Rash   Fluoxetine Hcl     Other reaction(s): Rash   Nucynta  [Tapentadol ] Other (See Comments)    Vertigo    Phenytoin Anxiety    Hyperactivity, and Ineffective. Per New Patient Packet.     Medications: Current Outpatient Medications  Medication Sig Dispense Refill   anastrozole  (ARIMIDEX ) 1 MG tablet Take 0.5 mg by mouth once a week.     aspirin  EC 81 MG tablet Take 1 tablet (81 mg total) by mouth daily. Swallow whole.     benzonatate  (TESSALON ) 100 MG capsule Take 1 capsule (100 mg total) by mouth every 8 (eight) hours. 21 capsule 0   Cholecalciferol  50 MCG (2000 UT) CAPS Take  2,000 Units by mouth daily.     cholestyramine  (QUESTRAN ) 4 g packet Take 4 g by mouth daily as needed (diarrhea).     Cyanocobalamin  (VITAMIN B-12) 5000 MCG LOZG Take 1 lozenge by mouth daily.      DHEA 25 MG CAPS Take 25 mg by mouth daily.      dicyclomine  (BENTYL ) 10 MG capsule Take 1 capsule (10 mg total) by mouth 4 (four) times daily -  before meals and at bedtime. 90 capsule 0   escitalopram  (LEXAPRO ) 20 MG tablet TAKE 1 TABLET(20 MG) BY MOUTH DAILY 90 tablet 1   ezetimibe -simvastatin  (VYTORIN ) 10-20 MG tablet Take 1 tablet by mouth daily. 90 tablet 3   guaiFENesin -codeine  100-10 MG/5ML syrup Take 5 mLs by mouth every 6 (six) hours as needed for cough. 120 mL 0   hydrALAZINE  (APRESOLINE ) 25 MG tablet Take 2 tabs p.o. in a.m. and q. afternoon, 1 tab p.o. in the evening 450 tablet 1   HYDROcodone -acetaminophen  (NORCO) 10-325 MG tablet Take 1 tablet by mouth every 12 (twelve) hours as needed. 60 tablet 0   ipratropium (ATROVENT ) 0.03 % nasal spray Place 2 sprays into both nostrils every 12 (twelve) hours. (Patient taking differently: Place 2 sprays into both nostrils 2 (two) times daily as needed (Allergies).) 30 mL 12   ketoconazole (NIZORAL) 2 % shampoo Apply 1 Application topically daily. With shower     loperamide  (IMODIUM  A-D) 2 MG tablet Take 2 mg by mouth 4 (four) times daily as needed for diarrhea or loose stools.     MAGNESIUM  BISGLYCINATE PO Take 1 tablet by mouth daily.     meclizine  (ANTIVERT ) 25 MG tablet Take 1 tablet (25 mg total) by mouth 3 (three) times daily as needed for dizziness. 30 tablet 0   melatonin 5 MG TABS Take 5 mg by mouth at bedtime.     MIEBO 1.338 GM/ML SOLN Place 1 drop into both eyes daily as needed (Dry eyes).     NONFORMULARY OR COMPOUNDED ITEM Bi-Mix Papaverine 30mg , Phentolamine 1mg    Dosage: Inject 0.25cc-0.5cc as need for ED   Vial 1ml   Qty #5 Refills 6   [facility] [phone] Fax [phone] (Patient taking differently: as needed. Bi-Mix Papaverine 30mg , Phentolamine 1mg   Dosage: Inject 0.25cc-0.5cc as need for ED Vial 1ml   Qty #5 Refills 6   [facility] [phone] Fax [phone]) 5 each 6   Nutritional Supplements (NUTRITIONAL SUPPLEMENT PO) Take 1  capsule by mouth daily. [name]     ondansetron  (ZOFRAN -ODT) 4 MG disintegrating tablet Take 1 tablet (4 mg total) by mouth every 8 (eight) hours as needed for nausea or vomiting. 20 tablet 0   PREVIDENT 5000 DRY MOUTH 1.1 % GEL dental gel Place 1 Application onto teeth 3 (three) times daily.     Probiotic Product (PROBIOTIC DAILY PO) Take 1 capsule by mouth daily.     propranolol  (INDERAL ) 40 MG tablet Take 1 tablet (40 mg total) by mouth 2 (two) times daily. 180 tablet 0   RABEprazole  (ACIPHEX ) 20 MG tablet Take 2 tablets (40 mg total) by mouth 2 (two) times daily. 360 tablet 3   testosterone  cypionate (DEPOTESTOSTERONE CYPIONATE) 200 MG/ML injection Inject 80 mg into the muscle every 14 (fourteen) days.     traZODone  (DESYREL ) 100 MG tablet Take 1 tablet (100 mg total) by mouth at bedtime. 90 tablet 0   triamcinolone cream (KENALOG) 0.1 % as needed (skin irritation, insect bites).  valACYclovir  (VALTREX ) 1000 MG tablet Take 2 tabs p.o. and repeat in 12 hours as needed for cold sore 30 tablet 0   Vibegron  (GEMTESA ) 75 MG TABS Take 1 tablet (75 mg total) by mouth daily. 30 tablet 11   No current facility-administered medications for this visit.    Review of Systems: GENERAL: negative for malaise, night sweats HEENT: No changes in hearing or vision, no nose bleeds or other nasal problems. NECK: Negative for lumps, goiter, pain and significant neck swelling RESPIRATORY: Negative for cough, wheezing CARDIOVASCULAR: Negative for chest pain, leg swelling, palpitations, orthopnea GI: SEE HPI MUSCULOSKELETAL: Negative for joint pain or swelling, back pain, and muscle pain. SKIN: Negative for lesions, rash HEMATOLOGY Negative for prolonged bleeding, bruising easily, and swollen nodes. ENDOCRINE: Negative for cold or heat intolerance, polyuria, polydipsia and goiter. NEURO: negative for tremor, gait imbalance, syncope and seizures. The remainder of the review of systems is  noncontributory.   Physical Exam: BP (!) 125/57   Pulse (!) 54   Temp 98.9 F (37.2 C)   Ht 5' 9 (1.753 m)   Wt 144 lb 3.2 oz (65.4 kg)   BMI 21.29 kg/m  GENERAL: The patient is AO x3, in no acute distress. HEENT: Head is normocephalic and atraumatic. EOMI are intact. Mouth is well hydrated and without lesions. NECK: Supple. No masses LUNGS: Clear to auscultation. No presence of rhonchi/wheezing/rales. Adequate chest expansion HEART: RRR, normal s1 and s2. ABDOMEN: Soft, diffuse lower abdomen tenderness, no guarding, no peritoneal signs, and nondistended. BS +. No masses.negative Carnet sign     Imaging/Labs: as above     Latest Ref Rng & Units 06/18/2024    1:18 PM 05/30/2024    6:29 PM 04/28/2024    4:09 PM  CBC  WBC 4.0 - 10.5 K/uL 7.6  6.8  6.4   Hemoglobin 13.0 - 17.0 g/dL 84.6  84.3  85.1   Hematocrit 39.0 - 52.0 % 47.3  47.8  46.8   Platelets 150 - 400 K/uL 202  198  255    Lab Results  Component Value Date   IRON 98 09/16/2020   TIBC 250 09/16/2020   FERRITIN 50 07/06/2023    I personally reviewed and interpreted the available labs, imaging and endoscopic files.  Ct abdomen and pelvis 05/2024  IMPRESSION: 1. Few fluid-filled loops of small bowel in the pelvis without wall thickening or inflammation, can be seen with enteritis. 2. Nonobstructing bilateral renal calculi. 3. Colonic diverticulosis without diverticulitis. 4. Nodular opacities in the lung bases have diminished in size or are stable from prior exam, previously assessed by chest CT and favoring chronic indolent infection.    IMPRESSION: No acute intracranial abnormality.   Impression and Plan:  Grant Young is a 78 y.o. male with coronary artery disease status post PCI on aspirin , chronic back pain/neuropathy, ascending aortic aneurysm follows with pain management who presents for evaluation of chronic progressive abdominal pain  #Chronic progressive abdominal pain  #Altered bowel  movements  Patient last evaluated in 06/2024 with EGD and colonoscopy. Colonoscopy was unremarkable; EGD showed external compression along the anterior gastric body. Recent CT angiography of the abdomen/pelvis was reviewed with radiology and does not reveal a source for the gastric compression and shows no occlusive mesenteric vascular disease/ chronic mesenteric ischemia, MALS (Median arcuate ligament syndrome) or SMA ( superior mesenteric artery). Patient is scheduled for EUS with Dr. Wilhelmenia on 12/18 for further evaluation.  Patient has chronic musculoskeletal pain and this may be  functional in nature. Patient abdominal pain improves with heating pad which gives me indication this is likely musculoskeletal in nature  Patient had upper endoscopy performed last year with small gastric ulcers and hence we may be dealing with peptic ulcer disease.  Previously had good response to PPI  I recommended restarting Linzess  72mcg as patient now is on opioids, to prevent constipation  -If EUS is negative then we may treat this as abdominal wall pain syndrome/ACNES and patient may benefit from transversus abdominis plane (TAP) block  All questions were answered.      Grant Skorupski Faizan Saran Laviolette, Grant Young Gastroenterology and Hepatology [facility] Gastroenterology   This chart has been completed using [facility] Dictation software, and while attempts have been made to ensure accuracy , certain words and phrases may not be transcribed as intended

## 2024-08-22 NOTE — Progress Notes (Unsigned)
 PROVIDER NOTE: Interpretation of information contained herein should be left to medically-trained personnel. Specific patient instructions are provided elsewhere under Patient Instructions section of medical record. This document was created in part using AI and STT-dictation technology, any transcriptional errors that may result from this process are unintentional.  Patient: Grant Young  Service: E/M   PCP: Antonette Angeline LELON, NP  DOB: 17-Sep-1945  DOS: 08/23/2024  Provider: Emmy MARLA Blanch, NP  MRN: [id number]  Delivery: Face-to-face  Specialty: Interventional Pain Management  Type: Established Patient  Setting: Ambulatory outpatient facility  Specialty designation: 09  Referring Prov.: Antonette Angeline LELON, NP  Location: Outpatient office facility       History of present illness (HPI) [name], a 78 y.o. year old male, is here today because of his No primary diagnosis found.. Grant Young primary complain today is No chief complaint on file.  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  Pain Assessment: Severity of   is reported as a  /10. Location:    / . Onset:  . Quality:  . Timing:  . Modifying factor(s):  SABRA Vitals:  vitals were not taken for this visit.  BMI: Estimated body mass index is 21.29 kg/m as calculated from the following:   Height as of 08/22/24: 5' 9 (1.753 m).   Weight as of 08/22/24: 144 lb 3.2 oz (65.4 kg).  Last encounter: 07/26/2024. Last procedure: Visit date not found.  Reason for encounter: medication management.   Discussed the use of AI scribe software for clinical note transcription with the patient, who gave verbal consent to proceed.  History of Present Illness            Pharmacotherapy Assessment   Norco 10-325 mg tablet every 12 hours as needed for pain Monitoring: [place] PMP: PDMP not reviewed this encounter.       Pharmacotherapy: No side-effects or adverse reactions reported. Compliance: No problems identified. Effectiveness: Clinically acceptable.  No notes on file  UDS:  Summary  Date Value Ref Range Status  09/17/2020 Note  Final    Comment:    ==================================================================== Compliance Drug Analysis, Ur ==================================================================== Specimen Alert Note: Urinary creatinine is low; ability to detect some drugs may be compromised. Interpret results with caution. (Creatinine) ==================================================================== Test                             Result       Flag       Units  Drug Present and Declared for Prescription Verification   Lorazepam                       1107         EXPECTED   ng/mg creat    Source of lorazepam  is a scheduled prescription medication.    Butalbital                      PRESENT      EXPECTED   Citalopram                     PRESENT      EXPECTED   Desmethylcitalopram  PRESENT      EXPECTED    Desmethylcitalopram is an expected metabolite of citalopram or the    enantiomeric form, escitalopram .    Acetaminophen                   PRESENT      EXPECTED   Propranolol                     PRESENT      EXPECTED  Drug Present not Declared for Prescription Verification   Oxazepam                       300          UNEXPECTED ng/mg creat   Temazepam                      357          UNEXPECTED ng/mg creat    Oxazepam and temazepam are expected metabolites of diazepam .    Oxazepam is also an expected metabolite of other benzodiazepine    drugs, including chlordiazepoxide, prazepam, clorazepate, halazepam,    and temazepam.  Oxazepam and temazepam are available as scheduled    prescription  medications.  Drug Absent but Declared for Prescription Verification   Hydrocodone                     Not Detected UNEXPECTED ng/mg creat   Tizanidine                      Not Detected UNEXPECTED    Tizanidine , as indicated in the declared medication list, is not    always detected even when used as directed.    Amitriptyline                   Not Detected UNEXPECTED   Prochlorperazine                Not Detected UNEXPECTED   Salicylate                     Not Detected UNEXPECTED    Aspirin , as indicated in the declared medication list, is not always    detected even when used as directed.  ==================================================================== Test                      Result    Flag   Units      Ref Range   Creatinine              14        LL     mg/dL      >=79 ==================================================================== Declared Medications:  The flagging and interpretation on this report are based on the  following declared medications.  Unexpected results may arise from  inaccuracies in the declared medications.   **Note: The testing scope of this panel includes these medications:   Amitriptyline  (Elavil )  Butalbital  (Fioricet )  Butalbital  (Fiorinal )  Escitalopram  (Lexapro )  Hydrocodone  (Norco)  Lorazepam  (Ativan )  Prochlorperazine  (Compazine )  Propranolol  (Inderal )   **Note: The testing scope of this panel does not include small to  moderate amounts of these reported medications:   Acetaminophen  (Fioricet )  Acetaminophen  (Norco)  Aspirin  (Fiorinal )  Tizanidine  (Zanaflex )   **Note: The testing scope of this panel does not include the  following reported medications:   Anastrozole  (Arimidex )  Caffeine  (Fioricet )  Caffeine  (Fiorinal )  Chlorthalidone  (Hygroton )  Cholecalciferol   Cholestyramine  (Questran )  Dicyclomine  (Bentyl )  Ezetimibe  (Zetia )  Hydrocortisone   Melatonin  Ondansetron  (Zofran )  Potassium (Klor-Con )  Rabeprazole   (Aciphex )  Sucralfate  (Carafate )  Valacyclovir  (Valtrex )  Vitamin B12 ==================================================================== For clinical consultation, please call [phone]. ====================================================================     No results found for: CBDTHCR No results found for: D8THCCBX No results found for: D9THCCBX  ROS  Constitutional: Denies any fever or chills Gastrointestinal: No reported hemesis, hematochezia, vomiting, or acute GI distress Musculoskeletal: Denies any acute onset joint swelling, redness, loss of ROM, or weakness Neurological: No reported episodes of acute onset apraxia, aphasia, dysarthria, agnosia, amnesia, paralysis, loss of coordination, or loss of consciousness  Medication Review  Cholecalciferol , DHEA, HYDROcodone -acetaminophen , Magnesium  Bisglycinate, NONFORMULARY OR COMPOUNDED ITEM, Nutritional Supplements, Perfluorohexyloctane, Probiotic Product, RABEprazole , Vibegron , Vitamin B-12, anastrozole , aspirin  EC, benzonatate , cholestyramine , dicyclomine , escitalopram , ezetimibe -simvastatin , guaiFENesin -codeine , hydrALAZINE , ipratropium, ketoconazole, linaclotide , loperamide , meclizine , melatonin, ondansetron , propranolol , sodium fluoride, testosterone  cypionate, traZODone , triamcinolone cream, and valACYclovir   History Review  Allergy: Grant Young is allergic to ace inhibitors, fluoxetine, metoclopramide, nalbuphine, other, amoxicillin-pot clavulanate, doxazosin, duloxetine, penicillins, tamsulosin, trazodone  and nefazodone, alirocumab , amlodipine, cinoxacin, ciprofloxacin, fludrocortisone , nebivolol, olanzapine, olmesartan, pregabalin, prostaglandins, thyroid  hormones, venlafaxine , venlafaxine  hcl er, zolpidem, duloxetine hcl, fluoxetine hcl, nucynta  [tapentadol ], and phenytoin. Drug: Grant Young  reports that he does not currently use drugs. Alcohol:  reports current alcohol use. Tobacco:  reports that he has never smoked.  He has never been exposed to tobacco smoke. He has never used smokeless tobacco. Social: Grant Young  reports that he has never smoked. He has never been exposed to tobacco smoke. He has never used smokeless tobacco. He reports current alcohol use. He reports that he does not currently use drugs. Medical:  has a past medical history of Allergy, Anemia, Aneurysm of ascending aorta, Anxiety, Aortic atherosclerosis, Cataract (2015), Chronic back pain, Chronic kidney disease (1980's), Clotting disorder (01-2022), Coronary artery disease, Depression, Erectile dysfunction, GERD (gastroesophageal reflux disease), h/o Lyme disease, Headache, Hiatal hernia, History of bilateral cataract extraction (01/2021), History of gastritis, History of kidney stones, History of neuropathy, Hyperlipidemia LDL goal <70, Hypertension, Insomnia, Left lower lobe pulmonary nodule, Long term current use of antithrombotics/antiplatelets, Long term current use of aromatase inhibitor, Lumbar radicular pain, Myocardial infarction [facility]), NSTEMI (non-ST elevated myocardial infarction) (HCC) (11/27/2021), Overactive bladder, Pneumonia, PONV (postoperative nausea and vomiting), Recurrent herpes labialis, Skin cancer, basal cell, Spinal cord stimulator status, Squamous cell skin cancer, Substance abuse (HCC), and Syncope and collapse (2022). Surgical: Grant Young  has a past surgical history that includes Kidney stone surgery (09/14/1980); Kidney stone surgery (09/15/1995); Lithotripsy (09/14/2014); Lithotripsy (09/14/1996); Lithotripsy (09/14/1997); Gallbladder surgery (09/15/2015); Sigmoidoscopy (09/14/2017); Colonoscopy (09/15/2015); Cholecystectomy (2017); Shoulder surgery (Right, 1984); Back surgery; Spinal cord stimulator implant; Pain pump implantation; Pain pump removal; Tonsillectomy; Foot surgery (Bilateral, 03/18/2020); Foot surgery (08/032021); Upper gi endoscopy; Colonoscopy with propofol  (N/A, 10/03/2020); Esophagogastroduodenoscopy (egd)  with propofol  (N/A, 10/03/2020); Cataract extraction w/PHACO (Left, 01/14/2021); Cataract extraction w/PHACO (Right, 01/28/2021); Coronary/Graft Acute MI Revascularization (N/A, 11/27/2021); LEFT HEART CATH AND CORONARY ANGIOGRAPHY (N/A, 11/27/2021); Excision basal cell carcinoma; transthoracic echocardiogram (11/28/2021); Video bronchoscopy with endobronchial ultrasound (N/A, 01/15/2023); Esophagogastroduodenoscopy (egd) with propofol  (N/A, 07/27/2023); biopsy (07/27/2023); Colonoscopy (N/A, 01/26/2024); Coronary angioplasty (1998, 2023); Spine surgery (1999, 2000); Cystoscopy/ureteroscopy/holmium laser/stent placement (Left, 06/26/2024); Colonoscopy (N/A, 07/18/2024); and Esophagogastroduodenoscopy (N/A, 07/18/2024). Family: family history includes Anxiety disorder in his daughter; Dementia in his mother; Diabetes in his mother; Heart disease in his father; Heart failure in his father; Hypertension in his father; Kidney Stones in his daughter; OCD in  his daughter; Stroke in his father and mother; Vision loss in his father.  Laboratory Chemistry Profile   Renal Lab Results  Component Value Date   BUN 14 06/18/2024   CREATININE 0.77 06/18/2024   BCR SEE NOTE: 04/28/2024   GFRAA 101 05/20/2020   GFRNONAA >60 06/18/2024    Hepatic Lab Results  Component Value Date   AST 23 06/18/2024   ALT 19 06/18/2024   ALBUMIN 4.0 06/18/2024   ALKPHOS 74 06/18/2024   AMYLASE 32 09/18/2022   LIPASE 37 06/18/2024    Electrolytes Lab Results  Component Value Date   NA 143 06/18/2024   K 3.9 06/18/2024   CL 106 06/18/2024   CALCIUM  9.4 06/18/2024   MG 2.3 05/30/2024   PHOS 3.6 11/30/2021    Bone Lab Results  Component Value Date   VD25OH 55.7 07/06/2023   TESTOSTERONE  33.2 05/20/2020    Inflammation (CRP: Acute Phase) (ESR: Chronic Phase) Lab Results  Component Value Date   CRP 1 07/06/2023   ESRSEDRATE 2 07/06/2023   LATICACIDVEN 0.8 06/18/2024         Note: Above Lab results  reviewed.  Recent Imaging Review  CT Angio Abd/Pel w/ and/or w/o CLINICAL DATA:  Chronic progressive abdominal pain and history coronary artery disease and thoracic aortic aneurysmal disease. Concern for potential chronic mesenteric ischemia.  EXAM: CT ANGIOGRAPHY ABDOMEN AND PELVIS WITH CONTRAST  TECHNIQUE: Multidetector CT imaging of the abdomen and pelvis was performed using the standard protocol during bolus administration of intravenous contrast. Multiplanar reconstructed images and MIPs were obtained and reviewed to evaluate the vascular anatomy.  RADIATION DOSE REDUCTION: This exam was performed according to the departmental dose-optimization program which includes automated exposure control, adjustment of the mA and/or kV according to patient size and/or use of iterative reconstruction technique.  CONTRAST:  OMNIPAQUE  IOHEXOL  350 MG/ML SOLN  COMPARISON:  CT abdomen pelvis without contrast 06/18/2024 and with contrast 05/30/2024. CT chest 03/31/2024.  FINDINGS: VASCULAR  Aorta: Normal caliber abdominal aorta demonstrating mild atherosclerosis. No evidence aneurysmal disease or dissection.  Celiac: Mild atherosclerosis at origin. No significant stenosis. Normally patent branch vessels and branching anatomy.  SMA: Normally patent. Accessory artery supplying the inferior aspect of the right lobe of the liver.  Renals: Normally patent single left and paired right renal arteries.  IMA: Normally patent.  In  Inflow: N through to [facility] patent bilateral iliac arteries. No significant obstructive disease or evidence of aneurysmal disease.  Proximal Outflow: Normally patent bilateral common femoral arteries and femoral bifurcations.  Veins: Venous phase imaging demonstrates normal patency venous structures in the abdomen and pelvis.  Review of the MIP images confirms the above findings.  NON-VASCULAR  Lower chest: Chronic lung disease and chronic waxing  and waning bilateral parenchymal nodularity in a bronchovascular distribution has been well documented on multiple prior chest CT exams. Ill-defined nodularity remains at both lung bases. No pleural effusions. Small hiatal hernia.  Hepatobiliary: No focal liver abnormality is seen. No gallstones, gallbladder wall thickening, or biliary dilatation.  Pancreas: Unremarkable. No pancreatic ductal dilatation or surrounding inflammatory changes.  Spleen: Normal in size without focal abnormality.  Adrenals/Urinary Tract: Adrenal glands are unremarkable. Stable nonobstructing calculi in both kidneys. No renal lesions or hydronephrosis. Bladder is unremarkable.  Stomach/Bowel: Bowel shows no evidence of obstruction, ileus, inflammation or lesion. The appendix is not discretely visualized. No free intraperitoneal air.  Lymphatic: No enlarged lymph nodes are identified in the abdomen or pelvis.  Reproductive: Prostate is unremarkable.  Other: No abdominal wall hernia or abnormality. No abdominopelvic ascites.  Musculoskeletal: Status post lumbar fusion at the L5-S1 level. Spinal stimulator present.  IMPRESSION: 1. No evidence of significant mesenteric arterial occlusive disease. 2. Mild atherosclerosis of the abdominal aorta and iliac arteries without evidence of aneurysmal disease or dissection. 3. Chronic lung disease and chronic waxing and waning bilateral parenchymal nodularity in a bronchovascular distribution has been well documented on multiple prior chest CT exams. Ill-defined nodularity remains at both lung bases. 4. Small hiatal hernia. 5. Stable nonobstructing calculi in both kidneys.  Electronically Signed   By: Marcey Moan M.D.   On: 06/27/2024 11:39 Note: Reviewed        Physical Exam  Vitals: There were no vitals taken for this visit. BMI: Estimated body mass index is 21.29 kg/m as calculated from the following:   Height as of 08/22/24: 5' 9 (1.753 m).    Weight as of 08/22/24: 144 lb 3.2 oz (65.4 kg). Ideal: Ideal body weight: 70.7 kg (155 lb 13.8 oz) General appearance: Well nourished, well developed, and well hydrated. In no apparent acute distress Mental status: Alert, oriented x 3 (person, place, & time)       Respiratory: No evidence of acute respiratory distress Eyes: PERLA   Assessment   Diagnosis Status  1. Abdominal pain, chronic, right lower quadrant   2. Right lower quadrant abdominal pain   3. Chronic pain syndrome   4. Chronic right shoulder pain    Controlled Controlled Controlled   Updated Problems: No problems updated.  Plan of Care  Problem-specific:  Assessment and Plan            Grant Young has a current medication list which includes the following long-term medication(s): dicyclomine , escitalopram , ezetimibe -simvastatin , hydralazine , ipratropium, linaclotide , propranolol , rabeprazole , testosterone  cypionate, and trazodone .  Pharmacotherapy (Medications Ordered): No orders of the defined types were placed in this encounter.  Orders:  No orders of the defined types were placed in this encounter.    {There is no content from the last Plan section.}   No follow-ups on file.    Recent Visits Date Type Provider Dept  07/26/24 Office Visit Todd Jelinski K, NP [facility]  06/06/24 Office Visit Marcelino Nurse, MD [facility]  Showing recent visits within past 90 days and meeting all other requirements Future Appointments Date Type Provider Dept  08/23/24 Appointment Ginnifer Creelman K, NP [facility]  Showing future appointments within next 90 days and meeting all other requirements  I discussed the assessment and treatment plan with the patient. The patient was provided an opportunity to ask questions and all were answered. The patient agreed with the plan and demonstrated an understanding of the instructions.  Patient advised to call back or seek an in-person  evaluation if the symptoms or condition worsens.  Duration of encounter: *** minutes.  Total time on encounter, as per AMA guidelines included both the face-to-face and non-face-to-face time personally spent by the physician and/or other qualified health care professional(s) on the day of the encounter (includes time in activities that require the physician or other qualified health care professional and does not include time in activities normally performed by clinical staff). Physician's time may include the following activities when performed: Preparing to see the patient (e.g., pre-charting review of records, searching for previously ordered imaging, lab work, and nerve conduction tests) Review of prior analgesic pharmacotherapies. Reviewing PMP Interpreting ordered tests (e.g., lab work, imaging, nerve conduction tests) Performing  post-procedure evaluations, including interpretation of diagnostic procedures Obtaining and/or reviewing separately obtained history Performing a medically appropriate examination and/or evaluation Counseling and educating the patient/family/caregiver Ordering medications, tests, or procedures Referring and communicating with other health care professionals (when not separately reported) Documenting clinical information in the electronic or other health record Independently interpreting results (not separately reported) and communicating results to the patient/ family/caregiver Care coordination (not separately reported)  Note by: Michaiah Holsopple K Oluwafemi Villella, NP (TTS and AI technology used. I apologize for any typographical errors that were not detected and corrected.) Date: 08/23/2024; Time: 1:18 PM

## 2024-08-22 NOTE — Telephone Encounter (Signed)
 Medication Samples have been provided to the patient.  Drug name: Linzess        Strength:       Qty: 3 boxes LOT: [id number]  Exp.Date: 09/2026  Dosing instructions: take one capsule po every morning   The patient has been instructed regarding the correct time, dose, and frequency of taking this medication, including desired effects and most common side effects.   [name] 2:49 PM 08/22/2024

## 2024-08-22 NOTE — Addendum Note (Signed)
 Addended by: Regie Bunner on: 08/22/2024 02:50 PM   Modules accepted: Orders

## 2024-08-22 NOTE — ED Triage Notes (Signed)
 Patient to Urgent Care with complaints of dysuria/ itching/ urinary frequency.  Symptoms x2-3 days. Recent UTI (10/24)- patient completed bactrim .   Taking homeopathic med with some relief.

## 2024-08-22 NOTE — Patient Instructions (Addendum)
 It was very nice to meet you today, as dicussed with will plan for the following :  1) start linzess  72 mcg 2) aciphex  20mg  daily 3) Follow up with Dr Wilhelmenia for EUS

## 2024-08-22 NOTE — ED Provider Notes (Signed)
 Grant Young    CSN: [id number] Arrival date & time: 08/22/24  1420      History   Chief Complaint Chief Complaint  Patient presents with   Urinary Frequency    HPI Grant Young is a 78 y.o. male.  Patient presents with 2 to 3-day history of dysuria and urinary frequency.  He has been treating his symptoms with (unknown name) homeopathic medication given to him by his wife.  No fever, abdominal pain, hematuria, flank pain.  Patient was seen at this urgent care on 07/07/2024; diagnosed with viral illness, urinary frequency, acute cystitis with hematuria; initially treated with Macrobid  which was changed to Bactrim  after the urine culture resulted.  The history is provided by the patient and medical records.    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   Kidney Stones   Clotting disorder 01-2022   Plavix    Coronary artery disease    a.) s/p PCI with DES x 2 (pLCx and o-mLAD) 11/27/2021   Depression    Erectile dysfunction    a.) Bi-mix (papaverine + phentolamine) injections + exogenous testosterone  injections   GERD (gastroesophageal reflux disease)    h/o Lyme disease    Headache    Hiatal hernia    History of bilateral cataract extraction 01/2021   History of gastritis    History of kidney stones    History of neuropathy    Hyperlipidemia LDL goal <70    a.) CAD with NSTEMI -->  intolerant of statins with statin myopathy and memory issues.;  Also intolerant of Repatha    Hypertension    Labile blood pressures but dizziness with blood pressures in the normal range; intolerant of most medications including ARB's, ACE inhibitor's, amlodipine most beta-blockers other than propranolol .   Insomnia    a.) takes  malatonin PRN   Left lower lobe pulmonary nodule    a.) chest CT 12/07/2022: nodules x 2 posteromedial LLL;  8 x 11 mm and 8 x 10 mm   Long term current use of antithrombotics/antiplatelets    a.) DAPT (ASA + clopidogrel )   Long term current use of aromatase inhibitor    a,) anastrozole  --> estridiol suppression secondary to exogenous testosterone  use   Lumbar radicular pain    Myocardial infarction (HCC)    a.) MI x 2 - 1990 * 1999 - PTCA   NSTEMI (non-ST elevated myocardial infarction) (HCC) 11/27/2021   a.) LHC 11/28/2021: sequential 75% o-pLAD, 70% OM2, 80% p-mLAD, 99% mLAD, 100% pLCx --> PCI placing a 2.5 x 26 mm Onyx Frontier DES to pLCx and a 2.5 x 24 mm Onyx Frontier DES to the o-m LAD (covering 3 lesions)   Overactive bladder    Pneumonia    PONV (postoperative nausea and vomiting)    Recurrent herpes labialis    a.) has suppressive valacyclovir  to use PRN   Skin cancer, basal cell    Spinal cord stimulator status    01/08/21 - not currently using.   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 06/14/2024   Encounter for screening colonoscopy 06/14/2024   Right lower quadrant abdominal pain 06/06/2024   Lesion of right ulnar nerve 05/16/2024   Entrapment of left ulnar nerve at elbow 05/04/2024   OSA (obstructive sleep apnea) 04/30/2024   Tear of right rotator cuff 01/18/2024   Chronic right shoulder pain 11/02/2023   History of cold sores 11/20/2022   Coronary artery disease involving native coronary artery of native heart without angina pectoris 08/18/2022   Chronic migraine without aura 11/28/2021   Orthostasis 09/01/2021   OAB (overactive bladder) 12/03/2020   GERD  (gastroesophageal reflux disease) 10/24/2020   Hypotestosteronism 10/24/2020   IBS (irritable bowel syndrome) 10/24/2020   Lyme disease 10/24/2020   Lumbar spondylosis 12/19/2019   Chronic pain syndrome 12/19/2019   Tremor 09/27/2019   Labile hypertension 09/21/2019   Gastritis and gastroduodenitis 09/30/2014   Insomnia 10/24/2002   Anxiety and depression 12/21/2001   Hyperlipidemia LDL goal <70 09/30/2001   Peripheral polyneuropathy 09/15/1999    Past Surgical History:  Procedure Laterality Date   BACK SURGERY     BASAL CELL CARCINOMA EXCISION     BIOPSY  07/27/2023   Procedure: BIOPSY;  Surgeon: Therisa Bi, MD;  Location: [facility] ENDOSCOPY;  Service: Gastroenterology;;   CATARACT EXTRACTION W/PHACO Left 01/14/2021   Procedure: CATARACT EXTRACTION PHACO AND INTRAOCULAR LENS PLACEMENT (IOC) LEFT VIVITY TORIC LENS 8.75 00:56.7;  Surgeon: Jaye Fallow, MD;  Location: [facility];  Service: Ophthalmology;  Laterality: Left;   CATARACT EXTRACTION W/PHACO Right 01/28/2021   Procedure: CATARACT EXTRACTION PHACO AND INTRAOCULAR LENS PLACEMENT (IOC) RIGHT VIVITY TORIC LENS;  Surgeon: Jaye Fallow, MD;  Location: [facility];  Service: Ophthalmology;  Laterality: Right;  6.54 00:46.4   CHOLECYSTECTOMY  2017   COLONOSCOPY  09/15/2015   Per New Patient Packet   COLONOSCOPY N/A 01/26/2024   Procedure: COLONOSCOPY;  Surgeon: Therisa Bi, MD;  Location: [facility] ENDOSCOPY;  Service: Gastroenterology;  Laterality: N/A;   COLONOSCOPY N/A 07/18/2024   Procedure: COLONOSCOPY;  Surgeon: Cinderella Deatrice FALCON, MD;  Location: [facility];  Service: Endoscopy;  Laterality: N/A;  9:00am, ASA 1-2   COLONOSCOPY WITH PROPOFOL  N/A 10/03/2020   Procedure: COLONOSCOPY WITH PROPOFOL ;  Surgeon: Therisa Bi, MD;  Location: [facility] ENDOSCOPY;  Service: Gastroenterology;  Laterality: N/A;   CORONARY ANGIOPLASTY  1998, 2023   CORONARY/GRAFT ACUTE MI REVASCULARIZATION N/A 11/27/2021   Procedure:  Coronary/Graft Acute MI Revascularization;  Surgeon: Anner Alm ORN, MD;  Location: [facility] CATH: (NSTEMI) - > 100% prox-mid LCx (Onyx Frontier DES 2.5 x 26 -> 2.7 mm, Ost  OM1 65%.  Ost-mid LAD 3 lesions 75%, 90%, 99% => DES PCI Onyx Frontier DES 2.5 x 34 -> 2.8 mm   CYSTOSCOPY/URETEROSCOPY/HOLMIUM LASER/STENT PLACEMENT Left 06/26/2024   Procedure: CYSTOSCOPY/URETEROSCOPY/HOLMIUM LASER/STENT PLACEMENT;  Surgeon: Francisca Redell BROCKS, MD;  Location: [facility] ORS;  Service: Urology;  Laterality: Left;   ESOPHAGOGASTRODUODENOSCOPY N/A 07/18/2024   Procedure: EGD (ESOPHAGOGASTRODUODENOSCOPY);  Surgeon: Cinderella Deatrice FALCON, MD;  Location: [facility];  Service: Endoscopy;  Laterality: N/A;   ESOPHAGOGASTRODUODENOSCOPY (EGD) WITH PROPOFOL  N/A 10/03/2020   Procedure: ESOPHAGOGASTRODUODENOSCOPY (EGD) WITH PROPOFOL ;  Surgeon: Therisa Bi, MD;  Location: [facility] ENDOSCOPY;  Service: Gastroenterology;  Laterality: N/A;   ESOPHAGOGASTRODUODENOSCOPY (EGD) WITH PROPOFOL  N/A 07/27/2023   Procedure: ESOPHAGOGASTRODUODENOSCOPY (EGD) WITH  PROPOFOL ;  Surgeon: Therisa Bi, MD;  Location: [facility] ENDOSCOPY;  Service: Gastroenterology;  Laterality: N/A;   FOOT SURGERY Bilateral 03/18/2020   FOOT SURGERY  08/032021   GALLBLADDER SURGERY  09/15/2015   Gallbladder Removal. Procedure done by Dr.Beverly. Per New Patient Packet   KIDNEY STONE SURGERY  09/14/1980   Too many to count. Per New Patient Packet 09/14/1980-09/15/1995   KIDNEY STONE SURGERY  09/15/1995   Too many to count. Per New Patient Packet   LEFT HEART CATH AND CORONARY ANGIOGRAPHY N/A 11/27/2021   Procedure: LEFT HEART CATH AND CORONARY ANGIOGRAPHY;  Surgeon: Anner Alm ORN, MD;  Location: [facility] CATH:  NSTEMI - 2 V CAD: 100% thrombotic prox-mid LCx (DES PCI), ost OM1 65%; Ost LAD 75% - prox LAD 90%, prox-mid 99% (DES PCI).  MIld diffuse RCA disease - calcicifed.  R-L collaterals filling LCx. EF ~45-50% with lateral HK. LVEDP 28 mmHg   LITHOTRIPSY  09/14/2014   Per New  Patient Packet   LITHOTRIPSY  09/14/1996   No Stints Used. Per New Patient Packet   LITHOTRIPSY  09/14/1997   No Stints used. Per New Patient Packet   PAIN PUMP IMPLANTATION     PAIN PUMP REMOVAL     SHOULDER SURGERY Right 1984   SIGMOIDOSCOPY  09/14/2017   Per New Patient Packet   SPINAL CORD STIMULATOR IMPLANT     SPINE SURGERY  1999, 2000   Fusion, Inst. Fusion   TONSILLECTOMY     TRANSTHORACIC ECHOCARDIOGRAM  11/28/2021   (in setting of non-STEMI): EF 50 to 55%.  Suspect inferior and lateral hypokinesis.  Indeterminate diastolic parameters.  Normal RV size and function.  Normal RAP.  Normal aortic and mitral valves with only mild MR and AI.  Normal RVP and RAP.   UPPER GI ENDOSCOPY     VIDEO BRONCHOSCOPY WITH ENDOBRONCHIAL ULTRASOUND N/A 01/15/2023   Procedure: VIDEO BRONCHOSCOPY WITH ENDOBRONCHIAL ULTRASOUND;  Surgeon: Parris Manna, MD;  Location: [facility] ORS;  Service: Thoracic;  Laterality: N/A;       Home Medications    Prior to Admission medications   Medication Sig Start Date End Date Taking? Authorizing Provider  anastrozole  (ARIMIDEX ) 1 MG tablet Take 0.5 mg by mouth once a week. 05/07/21   [provider]  aspirin  EC 81 MG tablet Take 1 tablet (81 mg total) by mouth daily. Swallow whole. 09/16/23   Anner Alm ORN, MD  benzonatate  (TESSALON ) 100 MG capsule Take 1 capsule (100 mg total) by mouth every 8 (eight) hours. 07/07/24   White, Shelba SAUNDERS, NP  cephALEXin  (KEFLEX ) 500 MG capsule Take 1 capsule (500 mg total) by mouth 3 (three) times daily for 7 days. 08/22/24 08/29/24  Corlis Burnard DEL, NP  Cholecalciferol  50 MCG (2000 UT) CAPS Take 2,000 Units by mouth daily. 04/15/20   [provider]  cholestyramine  (QUESTRAN ) 4 g packet Take 4 g by mouth daily as needed (diarrhea). 10/16/20   [provider]  Cyanocobalamin  (VITAMIN B-12) 5000 MCG LOZG Take 1 lozenge by mouth daily.     [provider]  DHEA 25 MG CAPS Take 25 mg by mouth daily.     [provider]  dicyclomine  (BENTYL ) 10 MG capsule Take 1 capsule (10 mg total) by mouth 4 (four) times daily -  before meals and at bedtime. 08/21/24   Antonette Angeline ORN, NP  escitalopram  (LEXAPRO ) 20 MG tablet TAKE 1 TABLET(20 MG) BY MOUTH DAILY 07/15/24   Antonette Angeline ORN, NP  ezetimibe -simvastatin  (VYTORIN ) 10-20  MG tablet Take 1 tablet by mouth daily. 10/19/23   Anner Alm ORN, MD  guaiFENesin -codeine  100-10 MG/5ML syrup Take 5 mLs by mouth every 6 (six) hours as needed for cough. 07/07/24   Teresa Shelba SAUNDERS, NP  hydrALAZINE  (APRESOLINE ) 25 MG tablet Take 2 tabs p.o. in a.m. and q. afternoon, 1 tab p.o. in the evening 05/29/24   Antonette Angeline ORN, NP  HYDROcodone -acetaminophen  (NORCO) 10-325 MG tablet Take 1 tablet by mouth every 12 (twelve) hours as needed. 07/26/24 08/25/24  Patel, Seema K, NP  ipratropium (ATROVENT ) 0.03 % nasal spray Place 2 sprays into both nostrils every 12 (twelve) hours. Patient taking differently: Place 2 sprays into both nostrils 2 (two) times daily as needed (Allergies). 09/20/23   Kennyth Domino, FNP  ketoconazole (NIZORAL) 2 % shampoo Apply 1 Application topically daily. With shower 08/02/23   [provider]  linaclotide  (LINZESS ) 72 MCG capsule Take 1 capsule (72 mcg total) by mouth daily before breakfast. 08/22/24 02/18/25  Ahmed, Deatrice FALCON, MD  linaclotide  (LINZESS ) 72 MCG capsule Take 1 capsule (72 mcg total) by mouth daily before breakfast. 08/22/24   Ahmed, Deatrice FALCON, MD  loperamide  (IMODIUM  A-D) 2 MG tablet Take 2 mg by mouth 4 (four) times daily as needed for diarrhea or loose stools.    [provider]  MAGNESIUM  BISGLYCINATE PO Take 1 tablet by mouth daily.    [provider]  meclizine  (ANTIVERT ) 25 MG tablet Take 1 tablet (25 mg total) by mouth 3 (three) times daily as needed for dizziness. 07/17/24   Antonette Angeline ORN, NP  melatonin 5 MG TABS Take 5 mg by mouth at bedtime.    [provider]  MIEBO 1.338 GM/ML SOLN Place  1 drop into both eyes daily as needed (Dry eyes). 09/23/23   [provider]  NONFORMULARY OR COMPOUNDED ITEM Bi-Mix Papaverine 30mg , Phentolamine 1mg    Dosage: Inject 0.25cc-0.5cc as need for ED   Vial 1ml   Qty #5 Refills 6   [facility] [phone] Fax [phone] Patient taking differently: as needed. Bi-Mix Papaverine 30mg , Phentolamine 1mg   Dosage: Inject 0.25cc-0.5cc as need for ED Vial 1ml   Qty #5 Refills 6   [facility] [phone] Fax [phone] 05/28/23   Maurine Lukes, PA-C  Nutritional Supplements (NUTRITIONAL SUPPLEMENT PO) Take 1 capsule by mouth daily. [name]    [provider]  ondansetron  (ZOFRAN -ODT) 4 MG disintegrating tablet Take 1 tablet (4 mg total) by mouth every 8 (eight) hours as needed for nausea or vomiting. 06/18/24   Bradler, Evan K, MD  PREVIDENT 5000 DRY MOUTH 1.1 % GEL dental gel Place 1 Application onto teeth 3 (three) times daily. 05/27/23   [provider]  Probiotic Product (PROBIOTIC DAILY PO) Take 1 capsule by mouth daily.    [provider]  propranolol  (INDERAL ) 40 MG tablet Take 1 tablet (40 mg total) by mouth 2 (two) times daily. 07/17/24   Antonette Angeline ORN, NP  RABEprazole  (ACIPHEX ) 20 MG tablet Take 1 tablet (20 mg total) by mouth daily. 08/22/24 08/17/25  Cinderella Deatrice FALCON, MD  testosterone  cypionate (DEPOTESTOSTERONE CYPIONATE) 200 MG/ML injection Inject 80 mg into the muscle every 14 (fourteen) days. 11/27/20   [provider]  traZODone  (DESYREL ) 100 MG tablet Take 1 tablet (100 mg total) by mouth at bedtime. 08/18/24   Antonette Angeline ORN, NP  triamcinolone cream (KENALOG) 0.1 % as needed (skin irritation, insect bites).    [provider]  valACYclovir  (VALTREX ) 1000 MG tablet Take 2 tabs p.o. and repeat in 12 hours as needed for cold sore 10/29/21   Antonette Angeline ORN, NP  Vibegron  (GEMTESA ) 75 MG TABS Take 1 tablet (75 mg total) by mouth daily.  05/24/24   Francisca Redell BROCKS, MD    Family History Family History  Problem Relation Age of Onset   Heart disease Father    Heart failure Father    Hypertension Father    Stroke Father    Vision loss Father    Stroke Mother    Dementia Mother    Diabetes Mother    Kidney Stones Daughter    Anxiety disorder Daughter    OCD Daughter     Social History Social History   Tobacco Use   Smoking status: Never    Passive exposure: Never   Smokeless tobacco: Never  Vaping Use   Vaping status: Never Used  Substance Use Topics   Alcohol use: Yes    Comment: 1 Drink a Month, socially   Drug use: Not Currently     Allergies   Ace inhibitors, Fluoxetine, Metoclopramide, Nalbuphine, Other, Amoxicillin-pot clavulanate, Doxazosin, Duloxetine, Penicillins, Tamsulosin, Trazodone  and nefazodone, Alirocumab , Amlodipine, Cinoxacin, Ciprofloxacin, Fludrocortisone , Nebivolol, Olanzapine, Olmesartan, Pregabalin, Prostaglandins, Thyroid  hormones, Venlafaxine , Venlafaxine  hcl er, Zolpidem, Duloxetine hcl, Fluoxetine hcl, Nucynta  [tapentadol ], and Phenytoin   Review of Systems Review of Systems  Constitutional:  Negative for chills and fever.  Gastrointestinal:  Negative for abdominal pain.  Genitourinary:  Positive for dysuria and frequency. Negative for flank pain and hematuria.     Physical Exam Triage Vital Signs ED Triage Vitals  Encounter Vitals Group     BP 08/22/24 1643 136/68     Girls Systolic BP Percentile --      Girls Diastolic BP Percentile --      Boys Systolic BP Percentile --      Boys Diastolic BP Percentile --      Pulse Rate 08/22/24 1643 60     Resp 08/22/24 1643 17     Temp 08/22/24 1643 (!) 97.5 F (36.4 C)     Temp src --      SpO2 08/22/24 1643 96 %     Weight --      Height --      Head Circumference --      Peak Flow --      Pain Score 08/22/24 1641 0     Pain Loc --      Pain Education --      Exclude from Growth Chart --    No data found.  Updated  Vital Signs BP 136/68   Pulse 60   Temp (!) 97.5 F (36.4 C)   Resp 17   SpO2 96%   Visual Acuity Right Eye Distance:   Left Eye Distance:   Bilateral Distance:    Right Eye Near:   Left Eye Near:    Bilateral Near:     Physical Exam Constitutional:      General: He is not in acute distress. HENT:     Mouth/Throat:     Mouth: Mucous membranes are moist.  Cardiovascular:     Rate and Rhythm: Normal rate.  Pulmonary:     Effort: Pulmonary effort is normal. No respiratory distress.  Abdominal:     General: Bowel sounds are normal.     Palpations: Abdomen is soft.     Tenderness: There is no abdominal tenderness. There is no right CVA tenderness, left  CVA tenderness, guarding or rebound.  Neurological:     Mental Status: He is alert.      UC Treatments / Results  Labs (all labs ordered are listed, but only abnormal results are displayed) Labs Reviewed  POCT URINE DIPSTICK - Abnormal; Notable for the following components:      Result Value   Color, UA straw (*)    Clarity, UA cloudy (*)    Blood, UA trace-intact (*)    Protein Ur, POC =100 (*)    Nitrite, UA Positive (*)    Leukocytes, UA Moderate (2+) (*)    All other components within normal limits  URINE CULTURE    EKG   Radiology No results found.  Procedures Procedures (including critical care time)  Medications Ordered in UC Medications - No data to display  Initial Impression / Assessment and Plan / UC Course  I have reviewed the triage vital signs and the nursing notes.  Pertinent labs & imaging results that were available during my care of the patient were reviewed by me and considered in my medical decision making (see chart for details).    UTI.  Afebrile and vital signs are stable.  Treating with Keflex . Urine culture pending.  GFR >60 on 06/18/2024.  Discussed with patient that we will call him if the urine culture shows the need to change or discontinue the antibiotic.  Instructed patient  to follow-up with his PCP for recheck of his urine due to his recurrent symptoms.  Education provided on UTI.  Patient agrees to plan of care.  Final Clinical Impressions(s) / UC Diagnoses   Final diagnoses:  Urinary tract infection without hematuria, site unspecified     Discharge Instructions      Take the antibiotic as directed.  The urine culture is pending.  We will call you if it shows the need to change or discontinue your antibiotic.    Please schedule a follow-up visit with your primary care provider for recheck of your urine.     ED Prescriptions     Medication Sig Dispense Auth. Provider   cephALEXin  (KEFLEX ) 500 MG capsule  (Status: Discontinued) Take 1 capsule (500 mg total) by mouth 3 (three) times daily for 5 days. 15 capsule Corlis Burnard DEL, NP   cephALEXin  (KEFLEX ) 500 MG capsule Take 1 capsule (500 mg total) by mouth 3 (three) times daily for 7 days. 21 capsule Corlis Burnard DEL, NP      PDMP not reviewed this encounter.   Corlis Burnard DEL, NP 08/22/24 1710

## 2024-08-22 NOTE — Discharge Instructions (Signed)
 Take the antibiotic as directed.  The urine culture is pending.  We will call you if it shows the need to change or discontinue your antibiotic.    Please schedule a follow-up visit with your primary care provider for recheck of your urine.

## 2024-08-23 ENCOUNTER — Telehealth: Payer: Self-pay | Admitting: Gastroenterology

## 2024-08-23 ENCOUNTER — Encounter: Payer: Self-pay | Admitting: Nurse Practitioner

## 2024-08-23 ENCOUNTER — Ambulatory Visit: Attending: Nurse Practitioner | Admitting: Nurse Practitioner

## 2024-08-23 VITALS — BP 159/69 | HR 63 | Temp 97.6°F | Resp 18 | Ht 69.0 in | Wt 144.0 lb

## 2024-08-23 DIAGNOSIS — G894 Chronic pain syndrome: Secondary | ICD-10-CM | POA: Diagnosis present

## 2024-08-23 DIAGNOSIS — M25511 Pain in right shoulder: Secondary | ICD-10-CM | POA: Diagnosis present

## 2024-08-23 DIAGNOSIS — R1031 Right lower quadrant pain: Secondary | ICD-10-CM | POA: Diagnosis present

## 2024-08-23 DIAGNOSIS — G8929 Other chronic pain: Secondary | ICD-10-CM | POA: Diagnosis present

## 2024-08-23 DIAGNOSIS — Z79899 Other long term (current) drug therapy: Secondary | ICD-10-CM | POA: Diagnosis present

## 2024-08-23 MED ORDER — HYDROCODONE-ACETAMINOPHEN 10-325 MG PO TABS: 1.0000 | ORAL_TABLET | Freq: Two times a day (BID) | ORAL | 0 refills | Status: DC | PRN

## 2024-08-23 NOTE — Telephone Encounter (Addendum)
 Procedure: Upper EUS Procedure date: 08/31/24 Procedure location: [facility] Arrival Time: 1:15 pm Spoke with the patient Y/N: Yes Any prep concerns? No  Has the patient obtained the prep from the pharmacy ? No prep needed Do you have a care partner and transportation: Yes Any additional concerns? No

## 2024-08-23 NOTE — Progress Notes (Signed)
 Nursing Pain Medication Assessment:  Safety precautions to be maintained throughout the outpatient stay will include: orient to surroundings, keep bed in low position, maintain call bell within reach at all times, provide assistance with transfer out of bed and ambulation.  Medication Inspection Compliance: Pill count conducted under aseptic conditions, in front of the patient. Neither the pills nor the bottle was removed from the patient's sight at any time. Once count was completed pills were immediately returned to the patient in their original bottle.  Medication: Hydrocodone /APAP Pill/Patch Count: 4 of 60 pills/patches remain Pill/Patch Appearance: Markings consistent with prescribed medication Bottle Appearance: Standard pharmacy container. Clearly labeled. Filled Date: 36 / 12 / 2025 Last Medication intake:  Today

## 2024-08-25 ENCOUNTER — Ambulatory Visit (HOSPITAL_COMMUNITY): Payer: Self-pay

## 2024-08-25 LAB — URINE CULTURE: Culture: >=100000 — AB

## 2024-08-28 ENCOUNTER — Encounter (HOSPITAL_COMMUNITY): Payer: Self-pay | Admitting: Gastroenterology

## 2024-08-28 NOTE — Progress Notes (Signed)
 Attempted to obtain medical history for pre op call via telephone, unable to reach at this time. HIPAA compliant voicemail message left requesting return call to pre surgical testing department.

## 2024-08-29 ENCOUNTER — Encounter: Payer: Self-pay | Admitting: Internal Medicine

## 2024-08-30 ENCOUNTER — Inpatient Hospital Stay: Admission: RE | Admit: 2024-08-30 | Discharge: 2024-08-30 | Payer: Self-pay | Attending: Emergency Medicine

## 2024-08-30 VITALS — BP 163/93 | HR 60 | Temp 98.3°F | Resp 18

## 2024-08-30 DIAGNOSIS — I10 Essential (primary) hypertension: Secondary | ICD-10-CM | POA: Insufficient documentation

## 2024-08-30 DIAGNOSIS — N390 Urinary tract infection, site not specified: Secondary | ICD-10-CM | POA: Insufficient documentation

## 2024-08-30 LAB — POCT URINE DIPSTICK
Bilirubin, UA: NEGATIVE
Blood, UA: NEGATIVE
Glucose, UA: NEGATIVE mg/dL
Ketones, POC UA: NEGATIVE mg/dL
Nitrite, UA: POSITIVE — AB
POC PROTEIN,UA: 30 — AB
Spec Grav, UA: 1.02 (ref 1.010–1.025)
Urobilinogen, UA: 0.2 U/dL
pH, UA: 5.5 (ref 5.0–8.0)

## 2024-08-30 NOTE — Discharge Instructions (Addendum)
 Call your urologist office to schedule the soonest available appointment for your recurrent ongoing urine symptoms.  Take the antibiotic as directed.  The urine culture is pending.  We will call you if it shows the need to change or discontinue your antibiotic.    Your blood pressure is elevated today at 169/93.  Please have this rechecked by your primary care provider.

## 2024-08-30 NOTE — ED Triage Notes (Signed)
 Patient to Urgent Care with complaints of dysuria/ itching/ urinary frequency.  Symptoms recurred on Sunday after completing abx.

## 2024-08-30 NOTE — ED Provider Notes (Signed)
 Grant Young    CSN: [id number] Arrival date & time: 08/30/24  1011      History   Chief Complaint Chief Complaint  Patient presents with   Urinary Frequency    Have had UTI just finished second antibiotic this past Sunday still having symptoms. - Entered by patient    HPI Grant Young is a 78 y.o. male.  Patient presents with ongoing dysuria, urinary frequency which did not resolve with most recent treatment and have been worse in the last 3 days.  No fever, abdominal pain, hematuria, flank pain.  He completed the course of cephalexin .  Patient was seen here on 08/22/2024; diagnosed with UTI; treated with cephalexin ; urine culture positive for Enterobacter cloacae.  Patient was seen at this urgent care on 07/07/2024; diagnosed with viral illness, urinary frequency, acute cystitis with hematuria; initially treated with Macrobid  but was changed to Bactrim  after the urine culture resulted.   The history is provided by the patient and medical records.    Past Medical History:  Diagnosis Date   Allergy    Anemia    Aneurysm of ascending aorta    a.) CT chest 08/18/2022: 4.1 cm; b.) CT chest 12/07/2022: 4.2 cm   Anxiety    a.) on BZO (diazepam ) PRN   Aortic atherosclerosis    Cataract 2015   Chronic back pain    Chronic kidney disease 1980s   Kidney Stones   Clotting disorder 01-2022   Plavix    Coronary artery disease    a.) s/p PCI with DES x 2 (pLCx and o-mLAD) 11/27/2021   Depression    Erectile dysfunction    a.) Bi-mix (papaverine + phentolamine) injections + exogenous testosterone  injections   GERD (gastroesophageal reflux disease)    h/o Lyme disease    Headache    Hiatal hernia    History of bilateral cataract extraction 01/2021   History of gastritis    History of kidney stones    History of neuropathy    Hyperlipidemia LDL goal <70    a.) CAD with NSTEMI -->  intolerant of statins with statin myopathy and memory issues.;  Also intolerant of Repatha     Hypertension    Labile blood pressures but dizziness with blood pressures in the normal range; intolerant of most medications including ARB's, ACE inhibitor's, amlodipine most beta-blockers other than propranolol .   Insomnia    a.) takes malatonin PRN   Left lower lobe pulmonary nodule    a.) chest CT 12/07/2022: nodules x 2 posteromedial LLL;  8 x 11 mm and 8 x 10 mm   Long term current use of antithrombotics/antiplatelets    a.) DAPT (ASA + clopidogrel )   Long term current use of aromatase inhibitor    a,) anastrozole  --> estridiol suppression secondary to exogenous testosterone  use   Lumbar radicular pain    Myocardial infarction (HCC)    a.) MI x 2 - 1990 * 1999 - PTCA   NSTEMI (non-ST elevated myocardial infarction) (HCC) 11/27/2021   a.) LHC 11/28/2021: sequential 75% o-pLAD, 70% OM2, 80% p-mLAD, 99% mLAD, 100% pLCx --> PCI placing a 2.5 x 26 mm Onyx Frontier DES to pLCx and a 2.5 x 24 mm Onyx Frontier DES to the o-m LAD (covering 3 lesions)   Overactive bladder    Pneumonia    PONV (postoperative nausea and vomiting)    Recurrent herpes labialis    a.) has suppressive valacyclovir  to use PRN   Skin cancer, basal cell  Spinal cord stimulator status    01/08/21 - not currently using.   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 06/14/2024   Encounter for screening colonoscopy 06/14/2024   Right lower quadrant abdominal pain 06/06/2024   Lesion of right ulnar nerve 05/16/2024   Entrapment of left ulnar nerve at elbow 05/04/2024   OSA (obstructive sleep apnea) 04/30/2024    Tear of right rotator cuff 01/18/2024   Chronic right shoulder pain 11/02/2023   History of cold sores 11/20/2022   Coronary artery disease involving native coronary artery of native heart without angina pectoris 08/18/2022   Chronic migraine without aura 11/28/2021   Orthostasis 09/01/2021   OAB (overactive bladder) 12/03/2020   GERD (gastroesophageal reflux disease) 10/24/2020   Hypotestosteronism 10/24/2020   IBS (irritable bowel syndrome) 10/24/2020   Lyme disease 10/24/2020   Lumbar spondylosis 12/19/2019   Chronic pain syndrome 12/19/2019   Tremor 09/27/2019   Labile hypertension 09/21/2019   Gastritis and gastroduodenitis 09/30/2014   Insomnia 10/24/2002   Anxiety and depression 12/21/2001   Hyperlipidemia LDL goal <70 09/30/2001   Peripheral polyneuropathy 09/15/1999    Past Surgical History:  Procedure Laterality Date   BACK SURGERY     BASAL CELL CARCINOMA EXCISION     BIOPSY  07/27/2023   Procedure: BIOPSY;  Surgeon: Therisa Bi, MD;  Location: [facility] ENDOSCOPY;  Service: Gastroenterology;;   CATARACT EXTRACTION W/PHACO Left 01/14/2021   Procedure: CATARACT EXTRACTION PHACO AND INTRAOCULAR LENS PLACEMENT (IOC) LEFT VIVITY TORIC LENS 8.75 00:56.7;  Surgeon: Jaye Fallow, MD;  Location: [facility];  Service: Ophthalmology;  Laterality: Left;   CATARACT EXTRACTION W/PHACO Right 01/28/2021   Procedure: CATARACT EXTRACTION PHACO AND INTRAOCULAR LENS PLACEMENT (IOC) RIGHT VIVITY TORIC LENS;  Surgeon: Jaye Fallow, MD;  Location: [facility];  Service: Ophthalmology;  Laterality: Right;  6.54 00:46.4   CHOLECYSTECTOMY  2017   COLONOSCOPY  09/15/2015   Per New Patient Packet   COLONOSCOPY N/A 01/26/2024   Procedure: COLONOSCOPY;  Surgeon: Therisa Bi, MD;  Location: [facility] ENDOSCOPY;  Service: Gastroenterology;  Laterality: N/A;   COLONOSCOPY N/A 07/18/2024   Procedure: COLONOSCOPY;  Surgeon: Cinderella Deatrice FALCON, MD;  Location: [facility];  Service:  Endoscopy;  Laterality: N/A;  9:00am, ASA 1-2   COLONOSCOPY WITH PROPOFOL  N/A 10/03/2020   Procedure: COLONOSCOPY WITH PROPOFOL ;  Surgeon: Therisa Bi, MD;  Location: [facility] ENDOSCOPY;  Service: Gastroenterology;  Laterality: N/A;   CORONARY ANGIOPLASTY  1998, 2023   CORONARY/GRAFT ACUTE MI REVASCULARIZATION N/A 11/27/2021   Procedure: Coronary/Graft Acute MI Revascularization;  Surgeon: Anner Alm ORN, MD;  Location: [facility] CATH: (NSTEMI) - > 100% prox-mid LCx (Onyx Frontier DES 2.5 x 26 -> 2.7 mm, Ost  OM1 65%.  Ost-mid LAD 3 lesions 75%, 90%, 99% => DES PCI Onyx Frontier DES 2.5 x 34 -> 2.8 mm   CYSTOSCOPY/URETEROSCOPY/HOLMIUM LASER/STENT PLACEMENT Left 06/26/2024   Procedure: CYSTOSCOPY/URETEROSCOPY/HOLMIUM LASER/STENT PLACEMENT;  Surgeon: Francisca Redell BROCKS, MD;  Location: [facility] ORS;  Service: Urology;  Laterality: Left;   ESOPHAGOGASTRODUODENOSCOPY N/A 07/18/2024   Procedure: EGD (ESOPHAGOGASTRODUODENOSCOPY);  Surgeon: Cinderella Deatrice FALCON, MD;  Location: [facility];  Service: Endoscopy;  Laterality: N/A;   ESOPHAGOGASTRODUODENOSCOPY (EGD) WITH PROPOFOL  N/A 10/03/2020   Procedure: ESOPHAGOGASTRODUODENOSCOPY (EGD) WITH PROPOFOL ;  Surgeon: Therisa Bi, MD;  Location: [facility] ENDOSCOPY;  Service: Gastroenterology;  Laterality: N/A;   ESOPHAGOGASTRODUODENOSCOPY (EGD) WITH PROPOFOL  N/A 07/27/2023   Procedure: ESOPHAGOGASTRODUODENOSCOPY (EGD) WITH PROPOFOL ;  Surgeon: Therisa Bi, MD;  Location: [facility] ENDOSCOPY;  Service: Gastroenterology;  Laterality: N/A;   FOOT SURGERY Bilateral 03/18/2020   FOOT SURGERY  08/032021   GALLBLADDER SURGERY  09/15/2015   Gallbladder Removal. Procedure done by Dr.Beverly. Per New Patient Packet   KIDNEY STONE SURGERY  09/14/1980   Too many to count. Per New Patient Packet 09/14/1980-09/15/1995   KIDNEY STONE SURGERY  09/15/1995   Too many to count. Per New Patient Packet   LEFT HEART CATH AND CORONARY ANGIOGRAPHY N/A 11/27/2021   Procedure: LEFT HEART CATH AND CORONARY  ANGIOGRAPHY;  Surgeon: Anner Alm ORN, MD;  Location: [facility] CATH:  NSTEMI - 2 V CAD: 100% thrombotic prox-mid LCx (DES PCI), ost OM1 65%; Ost LAD 75% - prox LAD 90%, prox-mid 99% (DES PCI).  MIld diffuse RCA disease - calcicifed.  R-L collaterals filling LCx. EF ~45-50% with lateral HK. LVEDP 28 mmHg   LITHOTRIPSY  09/14/2014   Per New Patient Packet   LITHOTRIPSY  09/14/1996   No Stints Used. Per New Patient Packet   LITHOTRIPSY  09/14/1997   No Stints used. Per New Patient Packet   PAIN PUMP IMPLANTATION     PAIN PUMP REMOVAL     SHOULDER SURGERY Right 1984   SIGMOIDOSCOPY  09/14/2017   Per New Patient Packet   SPINAL CORD STIMULATOR IMPLANT     SPINE SURGERY  1999, 2000   Fusion, Inst. Fusion   TONSILLECTOMY     TRANSTHORACIC ECHOCARDIOGRAM  11/28/2021   (in setting of non-STEMI): EF 50 to 55%.  Suspect inferior and lateral hypokinesis.  Indeterminate diastolic parameters.  Normal RV size and function.  Normal RAP.  Normal aortic and mitral valves with only mild MR and AI.  Normal RVP and RAP.   UPPER GI ENDOSCOPY     VIDEO BRONCHOSCOPY WITH ENDOBRONCHIAL ULTRASOUND N/A 01/15/2023   Procedure: VIDEO BRONCHOSCOPY WITH ENDOBRONCHIAL ULTRASOUND;  Surgeon: Parris Manna, MD;  Location: [facility] ORS;  Service: Thoracic;  Laterality: N/A;       Home Medications    Prior to Admission medications  Medication Sig Start Date End Date Taking? Authorizing Provider  sulfamethoxazole -trimethoprim  (BACTRIM  DS) 800-160 MG tablet Take 1 tablet by mouth 2 (two) times daily for 7 days. 08/30/24 09/06/24 Yes Corlis Burnard DEL, NP  anastrozole  (ARIMIDEX ) 1 MG tablet Take 0.5 mg by mouth once a week. 05/07/21   [provider]  aspirin  EC 81 MG tablet Take 1 tablet (81 mg total) by mouth daily. Swallow whole. 09/16/23   Anner Alm ORN, MD  benzonatate  (TESSALON ) 100 MG capsule Take 1 capsule (100 mg total) by mouth every 8 (eight) hours. Patient not taking: Reported on 08/30/2024 07/07/24   Teresa Shelba SAUNDERS, NP  Cholecalciferol  50 MCG (2000 UT) CAPS Take 2,000 Units by mouth daily. 04/15/20   [provider]  cholestyramine  (QUESTRAN ) 4 g packet Take 4 g by mouth daily as needed (diarrhea). 10/16/20   [provider]  Cyanocobalamin  (VITAMIN B-12) 5000 MCG LOZG Take 1 lozenge by mouth daily.     [provider]  DHEA 25 MG CAPS Take 25 mg by mouth daily.    [provider]  dicyclomine  (BENTYL ) 10 MG capsule Take 1 capsule (10 mg total) by mouth 4 (four) times daily -  before meals and at bedtime. 08/21/24   Antonette Angeline ORN, NP  escitalopram  (LEXAPRO ) 20 MG tablet TAKE 1 TABLET(20 MG) BY MOUTH DAILY 07/15/24   Antonette Angeline ORN, NP  ezetimibe -simvastatin  (VYTORIN ) 10-20 MG tablet Take 1 tablet by mouth daily. 10/19/23   Anner Alm ORN, MD  guaiFENesin -codeine  100-10 MG/5ML syrup Take 5 mLs by mouth every 6 (six) hours as needed for cough. Patient not taking: Reported on 08/30/2024 07/07/24   Teresa Shelba SAUNDERS, NP  hydrALAZINE  (APRESOLINE ) 25 MG tablet Take 2 tabs p.o. in a.m. and q. afternoon, 1 tab p.o. in the evening 05/29/24   Antonette Angeline ORN, NP  HYDROcodone -acetaminophen  (NORCO) 10-325 MG tablet Take 1 tablet by mouth every 12 (twelve) hours as needed. 08/25/24 09/24/24  Patel, Seema K, NP  ipratropium (ATROVENT ) 0.03 % nasal spray Place 2 sprays into both nostrils every 12 (twelve) hours. Patient taking differently: Place 2 sprays into both nostrils 2 (two) times daily as needed (Allergies). 09/20/23   Kennyth Domino, FNP  ketoconazole (NIZORAL) 2 % shampoo Apply 1 Application topically daily. With shower 08/02/23   [provider]  linaclotide  (LINZESS ) 72 MCG capsule Take 1 capsule (72 mcg total) by mouth daily before breakfast. 08/22/24 02/18/25  Ahmed, Deatrice FALCON, MD  linaclotide  (LINZESS ) 72 MCG capsule Take 1 capsule (72 mcg total) by mouth daily before breakfast. 08/22/24   Ahmed, Deatrice FALCON, MD  loperamide  (IMODIUM  A-D) 2 MG tablet Take 2 mg by mouth  4 (four) times daily as needed for diarrhea or loose stools.    [provider]  MAGNESIUM  BISGLYCINATE PO Take 1 tablet by mouth daily.    [provider]  meclizine  (ANTIVERT ) 25 MG tablet Take 1 tablet (25 mg total) by mouth 3 (three) times daily as needed for dizziness. 07/17/24   Antonette Angeline ORN, NP  melatonin 5 MG TABS Take 5 mg by mouth at bedtime.    [provider]  MIEBO 1.338 GM/ML SOLN Place 1 drop into both eyes daily as needed (Dry eyes). 09/23/23   [provider]  NONFORMULARY OR COMPOUNDED ITEM Bi-Mix Papaverine 30mg , Phentolamine 1mg    Dosage: Inject 0.25cc-0.5cc as need for ED   Vial 1ml   Qty #5 Refills 6   [facility] [phone] Fax [phone] Patient taking differently: as needed. Bi-Mix Papaverine 30mg , Phentolamine 1mg   Dosage: Inject 0.25cc-0.5cc as need for ED Vial 1ml   Qty #5 Refills 6   [facility] [phone] Fax [phone] 05/28/23   Maurine Lukes, PA-C  Nutritional Supplements (NUTRITIONAL SUPPLEMENT PO) Take 1 capsule by mouth daily. [name]    [provider]  ondansetron  (ZOFRAN -ODT) 4 MG disintegrating tablet Take 1 tablet (4 mg total) by mouth every 8 (eight) hours as needed for nausea or vomiting. 06/18/24   Bradler, Evan K, MD  PREVIDENT 5000 DRY MOUTH 1.1 % GEL dental gel Place 1 Application onto teeth 3 (three) times daily. 05/27/23   [provider]  Probiotic Product (PROBIOTIC DAILY PO) Take 1 capsule by mouth daily.    [provider]  propranolol  (INDERAL ) 40 MG tablet Take 1 tablet (40 mg total) by mouth 2 (two) times daily. 07/17/24   Antonette Angeline ORN, NP  RABEprazole  (ACIPHEX ) 20 MG tablet Take 1 tablet (20 mg total) by mouth daily. 08/22/24 08/17/25  Cinderella Deatrice FALCON, MD  testosterone  cypionate (  DEPOTESTOSTERONE CYPIONATE) 200 MG/ML injection Inject 80 mg into the muscle every 14 (fourteen) days. 11/27/20   [provider]   traZODone  (DESYREL ) 100 MG tablet Take 1 tablet (100 mg total) by mouth at bedtime. 08/18/24   Antonette Angeline ORN, NP  triamcinolone cream (KENALOG) 0.1 % as needed (skin irritation, insect bites).    [provider]  valACYclovir  (VALTREX ) 1000 MG tablet Take 2 tabs p.o. and repeat in 12 hours as needed for cold sore 10/29/21   Antonette Angeline ORN, NP  Vibegron  (GEMTESA ) 75 MG TABS Take 1 tablet (75 mg total) by mouth daily. 05/24/24   Francisca Redell BROCKS, MD    Family History Family History  Problem Relation Age of Onset   Heart disease Father    Heart failure Father    Hypertension Father    Stroke Father    Vision loss Father    Stroke Mother    Dementia Mother    Diabetes Mother    Kidney Stones Daughter    Anxiety disorder Daughter    OCD Daughter     Social History Social History[1]   Allergies   Ace inhibitors, Fluoxetine, Metoclopramide, Nalbuphine, Other, Amoxicillin-pot clavulanate, Doxazosin, Duloxetine, Penicillins, Tamsulosin, Trazodone  and nefazodone, Alirocumab , Amlodipine, Cinoxacin, Ciprofloxacin, Fludrocortisone , Nebivolol, Olanzapine, Olmesartan, Pregabalin, Prostaglandins, Thyroid  hormones, Venlafaxine , Venlafaxine  hcl er, Zolpidem, Duloxetine hcl, Fluoxetine hcl, Nucynta  [tapentadol ], and Phenytoin   Review of Systems Review of Systems  Constitutional:  Negative for chills and fever.  Gastrointestinal:  Negative for abdominal pain.  Genitourinary:  Positive for dysuria and frequency. Negative for flank pain and hematuria.     Physical Exam Triage Vital Signs ED Triage Vitals [08/30/24 1029]  Encounter Vitals Group     BP      Girls Systolic BP Percentile      Girls Diastolic BP Percentile      Boys Systolic BP Percentile      Boys Diastolic BP Percentile      Pulse Rate 60     Resp 18     Temp 98.3 F (36.8 C)     Temp src      SpO2 97 %     Weight      Height      Head Circumference      Peak Flow      Pain Score      Pain Loc      Pain  Education      Exclude from Growth Chart    No data found.  Updated Vital Signs BP (!) 163/93   Pulse 60   Temp 98.3 F (36.8 C)   Resp 18   SpO2 97%   Visual Acuity Right Eye Distance:   Left Eye Distance:   Bilateral Distance:    Right Eye Near:   Left Eye Near:    Bilateral Near:     Physical Exam Constitutional:      General: He is not in acute distress. HENT:     Mouth/Throat:     Mouth: Mucous membranes are moist.  Cardiovascular:     Rate and Rhythm: Normal rate.  Pulmonary:     Effort: Pulmonary effort is normal. No respiratory distress.  Abdominal:     General: Bowel sounds are normal.     Palpations: Abdomen is soft.     Tenderness: There is no abdominal tenderness. There is no right CVA tenderness, left CVA tenderness, guarding or rebound.  Neurological:     Mental Status: He is  alert.      UC Treatments / Results  Labs (all labs ordered are listed, but only abnormal results are displayed) Labs Reviewed  POCT URINE DIPSTICK - Abnormal; Notable for the following components:      Result Value   Clarity, UA cloudy (*)    POC PROTEIN,UA =30 (*)    Nitrite, UA Positive (*)    Leukocytes, UA Small (1+) (*)    All other components within normal limits  URINE CULTURE    EKG   Radiology No results found.  Procedures Procedures (including critical care time)  Medications Ordered in UC Medications - No data to display  Initial Impression / Assessment and Plan / UC Course  I have reviewed the triage vital signs and the nursing notes.  Pertinent labs & imaging results that were available during my care of the patient were reviewed by me and considered in my medical decision making (see chart for details).    Recurrent UTI, elevated blood pressure reading with hypertension.  Based on most recent urine culture, treating today with Bactrim .  Today's urine culture is pending.  Instructed patient to schedule an appointment with his urologist to soon  as possible for his recurrent and ongoing urinary symptoms.  Also discussed with patient that his blood pressure is elevated today and needs to be rechecked by his PCP next week.  Education provided on managing hypertension.  Patient agrees to plan of care.  Final Clinical Impressions(s) / UC Diagnoses   Final diagnoses:  Recurrent UTI  Elevated blood pressure reading in office with diagnosis of hypertension     Discharge Instructions      Call your urologist office to schedule the soonest available appointment for your recurrent ongoing urine symptoms.  Take the antibiotic as directed.  The urine culture is pending.  We will call you if it shows the need to change or discontinue your antibiotic.    Your blood pressure is elevated today at 169/93.  Please have this rechecked by your primary care provider.          ED Prescriptions     Medication Sig Dispense Auth. Provider   sulfamethoxazole -trimethoprim  (BACTRIM  DS) 800-160 MG tablet Take 1 tablet by mouth 2 (two) times daily for 7 days. 14 tablet Corlis Burnard DEL, NP      PDMP not reviewed this encounter.    [1]  Social History Tobacco Use   Smoking status: Never    Passive exposure: Never   Smokeless tobacco: Never  Vaping Use   Vaping status: Never Used  Substance Use Topics   Alcohol use: Yes    Comment: 1 Drink a Month, socially   Drug use: Not Currently     Corlis Burnard DEL, NP 08/30/24 1102

## 2024-08-31 ENCOUNTER — Encounter: Payer: Self-pay | Admitting: Physician Assistant

## 2024-08-31 ENCOUNTER — Ambulatory Visit (HOSPITAL_COMMUNITY)
Admission: RE | Admit: 2024-08-31 | Discharge: 2024-08-31 | Disposition: A | Discharge status: Home / Self Care | Attending: Gastroenterology | Admitting: Gastroenterology

## 2024-08-31 ENCOUNTER — Ambulatory Visit (HOSPITAL_COMMUNITY): Admitting: Anesthesiology

## 2024-08-31 ENCOUNTER — Encounter (HOSPITAL_COMMUNITY): Admission: RE | Disposition: A | Payer: Self-pay | Attending: Gastroenterology

## 2024-08-31 ENCOUNTER — Ambulatory Visit (INDEPENDENT_AMBULATORY_CARE_PROVIDER_SITE_OTHER): Admitting: Urology

## 2024-08-31 ENCOUNTER — Encounter (HOSPITAL_COMMUNITY): Payer: Self-pay | Admitting: Gastroenterology

## 2024-08-31 ENCOUNTER — Other Ambulatory Visit: Payer: Self-pay

## 2024-08-31 VITALS — BP 151/81 | HR 61 | Ht 69.0 in | Wt 144.0 lb

## 2024-08-31 DIAGNOSIS — N189 Chronic kidney disease, unspecified: Secondary | ICD-10-CM | POA: Diagnosis not present

## 2024-08-31 DIAGNOSIS — K449 Diaphragmatic hernia without obstruction or gangrene: Secondary | ICD-10-CM | POA: Diagnosis not present

## 2024-08-31 DIAGNOSIS — Z955 Presence of coronary angioplasty implant and graft: Secondary | ICD-10-CM | POA: Insufficient documentation

## 2024-08-31 DIAGNOSIS — K3189 Other diseases of stomach and duodenum: Secondary | ICD-10-CM

## 2024-08-31 DIAGNOSIS — I10 Essential (primary) hypertension: Secondary | ICD-10-CM | POA: Diagnosis not present

## 2024-08-31 DIAGNOSIS — I2510 Atherosclerotic heart disease of native coronary artery without angina pectoris: Secondary | ICD-10-CM | POA: Insufficient documentation

## 2024-08-31 DIAGNOSIS — K222 Esophageal obstruction: Secondary | ICD-10-CM

## 2024-08-31 DIAGNOSIS — F418 Other specified anxiety disorders: Secondary | ICD-10-CM | POA: Diagnosis not present

## 2024-08-31 DIAGNOSIS — I129 Hypertensive chronic kidney disease with stage 1 through stage 4 chronic kidney disease, or unspecified chronic kidney disease: Secondary | ICD-10-CM | POA: Diagnosis not present

## 2024-08-31 DIAGNOSIS — N390 Urinary tract infection, site not specified: Secondary | ICD-10-CM | POA: Diagnosis not present

## 2024-08-31 DIAGNOSIS — G4730 Sleep apnea, unspecified: Secondary | ICD-10-CM | POA: Insufficient documentation

## 2024-08-31 DIAGNOSIS — R933 Abnormal findings on diagnostic imaging of other parts of digestive tract: Secondary | ICD-10-CM | POA: Diagnosis present

## 2024-08-31 DIAGNOSIS — I899 Noninfective disorder of lymphatic vessels and lymph nodes, unspecified: Secondary | ICD-10-CM | POA: Diagnosis not present

## 2024-08-31 DIAGNOSIS — K2289 Other specified disease of esophagus: Secondary | ICD-10-CM

## 2024-08-31 DIAGNOSIS — K219 Gastro-esophageal reflux disease without esophagitis: Secondary | ICD-10-CM | POA: Insufficient documentation

## 2024-08-31 DIAGNOSIS — Z8711 Personal history of peptic ulcer disease: Secondary | ICD-10-CM | POA: Diagnosis not present

## 2024-08-31 DIAGNOSIS — I252 Old myocardial infarction: Secondary | ICD-10-CM | POA: Diagnosis not present

## 2024-08-31 HISTORY — PX: EUS: SHX5427

## 2024-08-31 HISTORY — PX: ESOPHAGOGASTRODUODENOSCOPY: SHX5428

## 2024-08-31 LAB — MICROSCOPIC EXAMINATION

## 2024-08-31 LAB — URINALYSIS, COMPLETE
Bilirubin, UA: NEGATIVE
Glucose, UA: NEGATIVE
Ketones, UA: NEGATIVE
Nitrite, UA: NEGATIVE
Protein,UA: NEGATIVE
RBC, UA: NEGATIVE
Specific Gravity, UA: 1.01 (ref 1.005–1.030)
Urobilinogen, Ur: 0.2 mg/dL (ref 0.2–1.0)
pH, UA: 6 (ref 5.0–7.5)

## 2024-08-31 LAB — BLADDER SCAN AMB NON-IMAGING

## 2024-08-31 SURGERY — ULTRASOUND, UPPER GI TRACT, ENDOSCOPIC
Anesthesia: Monitor Anesthesia Care

## 2024-08-31 MED ORDER — SODIUM CHLORIDE 0.9 % IV SOLN: INTRAVENOUS | Status: DC

## 2024-08-31 MED ORDER — PHENYLEPHRINE HCL (PRESSORS) 10 MG/ML IV SOLN
INTRAVENOUS | Status: DC | PRN
  Administered 2024-08-31: 14:00:00 80 ug via INTRAVENOUS

## 2024-08-31 MED ORDER — PROPOFOL 500 MG/50ML IV EMUL
INTRAVENOUS | Status: DC | PRN
  Administered 2024-08-31: 14:00:00 150 ug/kg/min via INTRAVENOUS

## 2024-08-31 MED ADMIN — Sodium Chloride IV Soln 0.9%: 500 mL | INTRAMUSCULAR | @ 13:00:00 | NDC 00338004904

## 2024-08-31 NOTE — Anesthesia Preprocedure Evaluation (Signed)
 Anesthesia Evaluation  Patient identified by MRN, date of birth, ID band Patient awake    Reviewed: Allergy & Precautions, NPO status , Patient's Chart, lab work & pertinent test results  Airway Mallampati: II  TM Distance: >3 FB Neck ROM: Full    Dental  (+) Dental Advisory Given   Pulmonary sleep apnea    breath sounds clear to auscultation       Cardiovascular hypertension, Pt. on medications + CAD, + Past MI and + Cardiac Stents   Rhythm:Regular Rate:Normal     Neuro/Psych  Headaches  Neuromuscular disease    GI/Hepatic Neg liver ROS, hiatal hernia, PUD,GERD  ,,  Endo/Other  negative endocrine ROS    Renal/GU CRFRenal disease     Musculoskeletal   Abdominal   Peds  Hematology  (+) Blood dyscrasia, anemia   Anesthesia Other Findings   Reproductive/Obstetrics                              Anesthesia Physical Anesthesia Plan  ASA: 3  Anesthesia Plan: MAC   Post-op Pain Management:    Induction:   PONV Risk Score and Plan: 1 and Propofol  infusion  Airway Management Planned: Natural Airway and Nasal Cannula  Additional Equipment:   Intra-op Plan:   Post-operative Plan:   Informed Consent: I have reviewed the patients History and Physical, chart, labs and discussed the procedure including the risks, benefits and alternatives for the proposed anesthesia with the patient or authorized representative who has indicated his/her understanding and acceptance.       Plan Discussed with:   Anesthesia Plan Comments:         Anesthesia Quick Evaluation

## 2024-08-31 NOTE — H&P (Signed)
 GASTROENTEROLOGY PROCEDURE H&P NOTE   Primary Care Physician: Antonette Angeline ORN, NP  HPI: [name] is a 78 y.o. male who presents for EGD/EUS to evaluate gastric SEL v extrinsic compression.  Past Medical History:  Diagnosis Date   Allergy    Anemia    Aneurysm of ascending aorta    a.) CT chest 08/18/2022: 4.1 cm; b.) CT chest 12/07/2022: 4.2 cm   Anxiety    a.) on BZO (diazepam ) PRN   Aortic atherosclerosis    Cataract 2015   Chronic back pain    Chronic kidney disease 1980s   Kidney Stones   Clotting disorder 01-2022   Plavix    Coronary artery disease    a.) s/p PCI with DES x 2 (pLCx and o-mLAD) 11/27/2021   Depression    Erectile dysfunction    a.) Bi-mix (papaverine + phentolamine) injections + exogenous testosterone  injections   GERD (gastroesophageal reflux disease)    h/o Lyme disease    Headache    Hiatal hernia    History of bilateral cataract extraction 01/2021   History of gastritis    History of kidney stones    History of neuropathy    Hyperlipidemia LDL goal <70    a.) CAD with NSTEMI -->  intolerant of statins with statin myopathy and memory issues.;  Also intolerant of Repatha    Hypertension    Labile blood pressures but dizziness with blood pressures in the normal range; intolerant of most medications including ARB's, ACE inhibitor's, amlodipine most beta-blockers other than propranolol .   Insomnia    a.) takes malatonin PRN   Left lower lobe pulmonary nodule    a.) chest CT 12/07/2022: nodules x 2 posteromedial LLL;  8 x 11 mm and 8 x 10 mm   Long term current use of antithrombotics/antiplatelets    a.) DAPT (ASA + clopidogrel )   Long term current use of aromatase inhibitor    a,) anastrozole  --> estridiol suppression secondary to exogenous testosterone  use   Lumbar radicular pain    Myocardial infarction (HCC)    a.) MI x 2 - 1990 * 1999 - PTCA   NSTEMI (non-ST elevated myocardial infarction) (HCC) 11/27/2021   a.) LHC 11/28/2021:  sequential 75% o-pLAD, 70% OM2, 80% p-mLAD, 99% mLAD, 100% pLCx --> PCI placing a 2.5 x 26 mm Onyx Frontier DES to pLCx and a 2.5 x 24 mm Onyx Frontier DES to the o-m LAD (covering 3 lesions)   Overactive bladder    Pneumonia    PONV (postoperative nausea and vomiting)    Recurrent herpes labialis    a.) has suppressive valacyclovir  to use PRN   Skin cancer, basal cell    Spinal cord stimulator status    01/08/21 - not currently using.   Squamous cell skin cancer    Substance abuse (HCC)    Syncope and collapse 2022   Past Surgical History:  Procedure Laterality Date   BACK SURGERY     BASAL CELL CARCINOMA EXCISION     BIOPSY  07/27/2023   Procedure: BIOPSY;  Surgeon: Therisa Bi, MD;  Location: [facility] ENDOSCOPY;  Service: Gastroenterology;;   CATARACT EXTRACTION W/PHACO Left 01/14/2021   Procedure: CATARACT EXTRACTION PHACO AND INTRAOCULAR LENS PLACEMENT (IOC) LEFT VIVITY TORIC LENS 8.75 00:56.7;  Surgeon: Jaye Fallow, MD;  Location: [facility];  Service: Ophthalmology;  Laterality: Left;   CATARACT EXTRACTION W/PHACO Right 01/28/2021   Procedure: CATARACT EXTRACTION PHACO AND INTRAOCULAR LENS PLACEMENT (IOC) RIGHT VIVITY TORIC LENS;  Surgeon: Jaye Fallow, MD;  Location: [facility];  Service: Ophthalmology;  Laterality: Right;  6.54 00:46.4   CHOLECYSTECTOMY  2017   COLONOSCOPY  09/15/2015   Per New Patient Packet   COLONOSCOPY N/A 01/26/2024   Procedure: COLONOSCOPY;  Surgeon: Therisa Bi, MD;  Location: [facility] ENDOSCOPY;  Service: Gastroenterology;  Laterality: N/A;   COLONOSCOPY N/A 07/18/2024   Procedure: COLONOSCOPY;  Surgeon: Cinderella Deatrice FALCON, MD;  Location: [facility];  Service: Endoscopy;  Laterality: N/A;  9:00am, ASA 1-2   COLONOSCOPY WITH PROPOFOL  N/A 10/03/2020   Procedure: COLONOSCOPY WITH PROPOFOL ;  Surgeon: Therisa Bi, MD;  Location: [facility] END[facility]COPY;  Service: Gastroenterology;  Laterality: N/A;   CORONARY ANGIOPLASTY  1998, 2023    CORONARY/GRAFT ACUTE MI REVASCULARIZATION N/A 11/27/2021   Procedure: Coronary/Graft Acute MI Revascularization;  Surgeon: Anner Alm ORN, MD;  Location: [facility] CATH: (NSTEMI) - > 100% prox-mid LCx (Onyx Frontier DES 2.5 x 26 -> 2.7 mm, Ost  OM1 65%.  Ost-mid LAD 3 lesions 75%, 90%, 99% => DES PCI Onyx Frontier DES 2.5 x 34 -> 2.8 mm   CYSTOSCOPY/URETEROSCOPY/HOLMIUM LASER/STENT PLACEMENT Left 06/26/2024   Procedure: CYSTOSCOPY/URETEROSCOPY/HOLMIUM LASER/STENT PLACEMENT;  Surgeon: Francisca Redell BROCKS, MD;  Location: [facility] ORS;  Service: Urology;  Laterality: Left;   ESOPHAGOGASTRODUODENOSCOPY N/A 07/18/2024   Procedure: EGD (ESOPHAGOGASTRODUODENOSCOPY);  Surgeon: Cinderella Deatrice FALCON, MD;  Location: [facility];  Service: Endoscopy;  Laterality: N/A;   ESOPHAGOGASTRODUODENOSCOPY (EGD) WITH PROPOFOL  N/A 10/03/2020   Procedure: ESOPHAGOGASTRODUODENOSCOPY (EGD) WITH PROPOFOL ;  Surgeon: Therisa Bi, MD;  Location: [facility] ENDOSCOPY;  Service: Gastroenterology;  Laterality: N/A;   ESOPHAGOGASTRODUODENOSCOPY (EGD) WITH PROPOFOL  N/A 07/27/2023   Procedure: ESOPHAGOGASTRODUODENOSCOPY (EGD) WITH PROPOFOL ;  Surgeon: Therisa Bi, MD;  Location: [facility] ENDOSCOPY;  Service: Gastroenterology;  Laterality: N/A;   FOOT SURGERY Bilateral 03/18/2020   FOOT SURGERY  08/032021   GALLBLADDER SURGERY  09/15/2015   Gallbladder Removal. Procedure done by Dr.Beverly. Per New Patient Packet   KIDNEY STONE SURGERY  09/14/1980   Too many to count. Per New Patient Packet 09/14/1980-09/15/1995   KIDNEY STONE SURGERY  09/15/1995   Too many to count. Per New Patient Packet   LEFT HEART CATH AND CORONARY ANGIOGRAPHY N/A 11/27/2021   Procedure: LEFT HEART CATH AND CORONARY ANGIOGRAPHY;  Surgeon: Anner Alm ORN, MD;  Location: [facility] CATH:  NSTEMI - 2 V CAD: 100% thrombotic prox-mid LCx (DES PCI), ost OM1 65%; Ost LAD 75% - prox LAD 90%, prox-mid 99% (DES PCI).  MIld diffuse RCA disease - calcicifed.  R-L collaterals filling LCx. EF ~45-50%  with lateral HK. LVEDP 28 mmHg   LITHOTRIPSY  09/14/2014   Per New Patient Packet   LITHOTRIPSY  09/14/1996   No Stints Used. Per New Patient Packet   LITHOTRIPSY  09/14/1997   No Stints used. Per New Patient Packet   PAIN PUMP IMPLANTATION     PAIN PUMP REMOVAL     SHOULDER SURGERY Right 1984   SIGMOIDOSCOPY  09/14/2017   Per New Patient Packet   SPINAL CORD STIMULATOR IMPLANT     SPINE SURGERY  1999, 2000   Fusion, Inst. Fusion   TONSILLECTOMY     TRANSTHORACIC ECHOCARDIOGRAM  11/28/2021   (in setting of non-STEMI): EF 50 to 55%.  Suspect inferior and lateral hypokinesis.  Indeterminate diastolic parameters.  Normal RV size and function.  Normal RAP.  Normal aortic and mitral valves with only mild MR and AI.  Normal RVP and RAP.   UPPER GI ENDOSCOPY  VIDEO BRONCHOSCOPY WITH ENDOBRONCHIAL ULTRASOUND N/A 01/15/2023   Procedure: VIDEO BRONCHOSCOPY WITH ENDOBRONCHIAL ULTRASOUND;  Surgeon: Parris Manna, MD;  Location: [facility];  Service: Thoracic;  Laterality: N/A;   Current Facility-Administered Medications  Medication Dose Route Frequency Provider Last Rate Last Admin   0.9 %  sodium chloride  infusion    Continuous PRN Mansouraty, Aloha Raddle., MD 10 mL/hr at 08/31/24 1316 500 mL at 08/31/24 1316   Current Medications[1] Allergies[2] Family History  Problem Relation Age of Onset   Heart disease Father    Heart failure Father    Hypertension Father    Stroke Father    Vision loss Father    Stroke Mother    Dementia Mother    Diabetes Mother    Kidney Stones Daughter    Anxiety disorder Daughter    OCD Daughter    Social History   Socioeconomic History   Marital status: Married    Spouse name: Therapist, Art   Number of children: Not on file   Years of education: Not on file   Highest education level: Master's degree (e.g., MA, MS, MEng, MEd, MSW, MBA)  Occupational History   Not on file  Tobacco Use   Smoking status: Never    Passive exposure: Never   Smokeless  tobacco: Never  Vaping Use   Vaping status: Never Used  Substance and Sexual Activity   Alcohol use: Yes    Comment: 1 Drink a Month, socially   Drug use: Not Currently   Sexual activity: Yes    Birth control/protection: None  Other Topics Concern   Not on file  Social History Narrative   Tobacco use, amount per day now: None   Past tobacco use, amount per day: None   How many years did you use tobacco: 0   Alcohol use (drinks per week): 0-1 Month   Diet:   Do you drink/eat things with caffeine : Occasionally ( Hot Chocolate and maybe 1/4 of 16oz Pepsi 2-3 times a week.   Marital status: Married                                  What year were you married? 1970   Do you live in a house, apartment, assisted living, condo, trailer, etc.? House   Is it one or more stories? One   How many persons live in your home? 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 Yes                                    Type and how often? [place], Recumbent Bike, 3 times a week. Try to get a 1.2 mile walk at least 3 times a week or more.     Do you have a living will? Yes   Do you have a DNR form?  No                                 If not, do you want to discuss one?   Do you have signed POA/HPOA forms? Yes  If so, please bring to you appointment    Do you have any difficulty bathing or dressing yourself? No    Do you have difficulty preparing food or eating? No   Do you have difficulty managing your medications? No   Do you have any difficulty managing your finances? No   Do you have any difficulty affording your medications? No         Social Drivers of Health   Tobacco Use: Low Risk (08/31/2024)   Patient History    Smoking Tobacco Use: Never    Smokeless Tobacco Use: Never    Passive Exposure: Never  Financial  Resource Strain: Low Risk (04/24/2024)   Overall Financial Resource Strain (CARDIA)    Difficulty of Paying Living Expenses: Not very hard  Food Insecurity: No Food Insecurity (04/24/2024)   Epic    Worried About Programme Researcher, Broadcasting/film/video in the Last Year: Never true    Ran Out of Food in the Last Year: Never true  Transportation Needs: No Transportation Needs (04/24/2024)   Epic    Lack of Transportation (Medical): No    Lack of Transportation (Non-Medical): No  Physical Activity: Insufficiently Active (12/24/2023)   Exercise Vital Sign    Days of Exercise per Week: 3 days    Minutes of Exercise per Session: 30 min  Stress: No Stress Concern Present (12/24/2023)   [employer] of Occupational Health - Occupational Stress Questionnaire    Feeling of Stress : Not at all  Social Connections: Moderately Isolated (04/24/2024)   Social Connection and Isolation Panel    Frequency of Communication with Friends and Family: Once a week    Frequency of Social Gatherings with Friends and Family: Once a week    Attends Religious Services: 1 to 4 times per year    Active Member of [employer] or Organizations: No    Attends Banker Meetings: Not on file    Marital Status: Married  Catering Manager Violence: Not At Risk (12/24/2023)   Humiliation, Afraid, Rape, and Kick questionnaire    Fear of Current or Ex-Partner: No    Emotionally Abused: No    Physically Abused: No    Sexually Abused: No  Depression (PHQ2-9): Low Risk (08/23/2024)   Depression (PHQ2-9)    PHQ-2 Score: 0  Alcohol Screen: Low Risk (12/24/2023)   Alcohol Screen    Last Alcohol Screening Score (AUDIT): 0  Housing: Low Risk (04/24/2024)   Epic    Unable to Pay for Housing in the Last Year: No    Number of Times Moved in the Last Year: 0    Homeless in the Last Year: No  Utilities: Not At Risk (12/24/2023)   AHC Utilities    Threatened with loss of utilities: No  Health Literacy: Adequate Health Literacy (12/24/2023)    B1300 Health Literacy    Frequency of need for help with medical instructions: Never    Physical Exam: Today's Vitals   08/31/24 1316  Pulse: 68  Resp: 16  Temp: (!) 97.5 F (36.4 C)  TempSrc: Temporal  SpO2: 97%  Weight: 65.3 kg  Height: 5' 9 (1.753 m)  PainSc: 4    Body mass index is 21.27 kg/m. GEN: NAD EYE: Sclerae anicteric ENT: MMM CV: Non-tachycardic GI: Soft, NT/ND NEURO:  Alert & Oriented x 3  Lab Results: No results for input(s): WBC, HGB, HCT, PLT in the last 72 hours. BMET No results for input(s): NA, K, CL, CO2, GLUCOSE, BUN, CREATININE, CALCIUM  in  the last 72 hours. LFT No results for input(s): PROT, ALBUMIN, AST, ALT, ALKPHOS, BILITOT, BILIDIR, IBILI in the last 72 hours. PT/INR No results for input(s): LABPROT, INR in the last 72 hours.   Impression / Plan: This is a 78 y.o.male who presents for EGD/EUS to evaluate gastric SEL v extrinsic compression.  The risks of an EUS including intestinal perforation, bleeding, infection, aspiration, and medication effects were discussed as was the possibility it may not give a definitive diagnosis if a biopsy is performed.  When a biopsy of the pancreas is done as part of the EUS, there is an additional risk of pancreatitis at the rate of about 1-2%.  It was explained that procedure related pancreatitis is typically mild, although it can be severe and even life threatening, which is why we do not perform random pancreatic biopsies and only biopsy a lesion/area we feel is concerning enough to warrant the risk.   The risks and benefits of endoscopic evaluation/treatment were discussed with the patient and/or family; these include but are not limited to the risk of perforation, infection, bleeding, missed lesions, lack of diagnosis, severe illness requiring hospitalization, as well as anesthesia and sedation related illnesses.  The patient's history has been reviewed, patient  examined, no change in status, and deemed stable for procedure.  The patient and/or family was provided an opportunity to ask questions and all were answered.  The patient and/or family is agreeable to proceed.    Aloha Finner, MD [facility] Office # [phone]     [1]  Current Facility-Administered Medications:    0.9 %  sodium chloride  infusion, , , Continuous PRN, Mansouraty, Aloha Raddle., MD, Last Rate: 10 mL/hr at 08/31/24 1316, 500 mL at 08/31/24 1316 [2]  Allergies Allergen Reactions   Ace Inhibitors     Other reaction(s): Cough   Fluoxetine Anxiety    made me fall asleep per pt bad headaches and makes  Me  Crazy historical allergy noted in [employer]   Metoclopramide     Other reaction(s): Other (See Comments), Other (See Comments), Unknown Tardive Dyskinesia  historical allergy noted in [employer]   Nalbuphine     Used Post Back surgery- Anesthesiologist Error. Patient had Narcotic Withdraw. Per New Patient Packet.    Other     Other reaction(s): Other (See Comments) Altered mental status in combo with narcotics at previous hospitalization - Full Withdrawal Symptoms Other reaction(s): Rash   Amoxicillin-Pot Clavulanate Nausea Only    Per New Patient Packet.   Doxazosin Rash    Other reaction(s): Other - See Comments, Rash     Duloxetine Nausea Only    Per New Patient Packet.    Penicillins Nausea Only    Per New Patient Packet.   Tamsulosin Itching and Anxiety    Restless, Flushing, Heavy Chest, Itching, Hyperactive mood and Anxiety. Unable to handle side effects. Per New Patient Packet.     Trazodone  And Nefazodone Itching, Anxiety and Rash    Headache. INCREASED MY ANXIETY AND HEARTRATE Flushing, tachycardia INCREASED MY ANXIETY AND HEARTRATE Per New Patient Packet.    Alirocumab  Other (See Comments)   Amlodipine     Shaking, unsure of reaction. Per New Patient Packet.     Cinoxacin     GI Intolerance, and  Dizziness. Per New Patient Packet.    Ciprofloxacin     Other reaction(s): Unknown   Fludrocortisone  Other (See Comments)    Worsening headaches, GI issues, fatigue   Nebivolol  Other reaction(s): Unknown   Olanzapine     Headache and unable to sleep for 3 nights. Per New Patient Packet.    Olmesartan     Other reaction(s): Unknown   Pregabalin     Confusion, Lack of concentration, dizziness, and likely drowsiness. Per New Patient Packet.     Prostaglandins     Other reaction(s): Other (See Comments) Intolerance   Thyroid  Hormones     Other reaction(s): Other (See Comments) Thyroid  (Nature Thyroid ) contraindicated with some of your other medications.   Venlafaxine  Other (See Comments)    High blood pressure- hospitalized   venlafaxine    Venlafaxine  Hcl Er Hypertension   Zolpidem     Nightmares, Ineffective after 2 days. Per New Patient Packet.    Duloxetine Hcl     Other reaction(s): Rash   Fluoxetine Hcl     Other reaction(s): Rash   Nucynta  [Tapentadol ] Other (See Comments)    Vertigo    Phenytoin Anxiety    Hyperactivity, and Ineffective. Per New Patient Packet.

## 2024-08-31 NOTE — Anesthesia Postprocedure Evaluation (Signed)
 Anesthesia Post Note  Patient: Grant Young  Procedure(s) Performed: ULTRASOUND, UPPER GI TRACT, ENDOSCOPIC EGD (ESOPHAGOGASTRODUODENOSCOPY)     Patient location during evaluation: PACU Anesthesia Type: MAC Level of consciousness: awake and alert Pain management: pain level controlled Vital Signs Assessment: post-procedure vital signs reviewed and stable Respiratory status: spontaneous breathing, nonlabored ventilation, respiratory function stable and patient connected to nasal cannula oxygen Cardiovascular status: stable and blood pressure returned to baseline Postop Assessment: no apparent nausea or vomiting Anesthetic complications: no   No notable events documented.  Last Vitals:  Vitals:   08/31/24 1500 08/31/24 1505  BP:  (!) 176/83  Pulse: 61 60  Resp: 14 (!) 21  Temp:    SpO2: 98% 98%    Last Pain:  Vitals:   08/31/24 1505  TempSrc:   PainSc: (P) 0-No pain                 [name]

## 2024-08-31 NOTE — Progress Notes (Signed)
 08/31/2024 1:26 PM   Grant Young [birth year] [id number]  Referring provider: Antonette Angeline LELON, NP [address] [place],  [place] [address]  Urological history: 1.  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 BPH with LU TS - PSA (05/2023) 0.4 - green light PVP in remote past - prostate volume (TRUS 2022) 15 gram  - cysto (10/2020) NED   3. OAB - Gemtesa  75 mg daily   4. ED - BiMix   5. Nephrolithiasis - contrast CT (2024) through the kidneys. No hydronephrosis. There are bilateral intrarenal calculi, largest stone on the right is in the lower pole measuring 7 mm.  - left URS (06/2024)   Chief Complaint  Patient presents with   Recurrent UTI    HPI: Grant Young is a 78 y.o. man who presents today for recurrent UTI's.    Previous records reviewed.  He states 2 days after the stent was taken out on July 04, 2024, he started to have burning and itching in the urethra and bilateral back pain.  He was seen on October 24 for fever, headache, nausea, diarrhea and urinary frequency.  He was initially started on Macrobid  and then switched to Septra .  His urine culture grew out Enterobacter cloacae   he was then seen back in the urgent care on December 9 for dysuria, itching, and urinary frequency and started on Keflex .  His urine again grew out Enterobacter cloacae.  He then went back to the urgent care on December 17 because the symptoms returned after he completed the Keflex .   He was placed back on Bactrim  and his urine culture is pending at this time.  He denied any fever, gross hematuria at this time.  He states that he has been experiencing bilateral back pain.  UA yellow clear, specific gravity 1.010, pH 6.0, 1+ leukocyte, 11-30 WBCs, 0-2 RBCs, 0-10 epithelial cells and a few bacteria.  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   Kidney Stones   Clotting disorder 01-2022   Plavix    Coronary artery disease    a.) s/p PCI with DES x 2 (pLCx and o-mLAD) 11/27/2021   Depression    Erectile dysfunction    a.) Bi-mix (papaverine + phentolamine) injections + exogenous testosterone  injections   GERD (gastroesophageal reflux disease)    h/o Lyme disease    Headache    Hiatal hernia    History of bilateral cataract extraction 01/2021   History of gastritis    History of kidney stones    History of neuropathy    Hyperlipidemia LDL goal <70    a.) CAD with NSTEMI -->  intolerant of statins with statin myopathy and memory issues.;  Also intolerant of Repatha    Hypertension    Labile blood pressures but dizziness with blood pressures in the normal range; intolerant of most medications including ARB's, ACE inhibitor's, amlodipine most beta-blockers other than propranolol .   Insomnia    a.) takes malatonin PRN   Left lower lobe pulmonary nodule    a.) chest CT 12/07/2022: nodules x 2 posteromedial LLL;  8 x 11 mm and 8 x 10 mm   Long term current use of antithrombotics/antiplatelets    a.) DAPT (ASA + clopidogrel )   Long term current use of aromatase inhibitor    a,) anastrozole  --> estridiol suppression secondary to exogenous testosterone  use   Lumbar radicular pain    Myocardial infarction (HCC)    a.) MI x 2 - 1990 * 1999 - PTCA   NSTEMI (non-ST elevated myocardial infarction) (HCC) 11/27/2021   a.) LHC 11/28/2021: sequential 75% o-pLAD, 70% OM2, 80% p-mLAD, 99% mLAD, 100% pLCx --> PCI placing a 2.5 x 26 mm Onyx Frontier DES to pLCx and a 2.5 x 24 mm Onyx Frontier DES to the o-m LAD (covering 3 lesions)   Overactive bladder    Pneumonia    PONV (postoperative nausea and vomiting)    Recurrent herpes labialis    a.) has suppressive valacyclovir  to use PRN   Skin cancer, basal  cell    Spinal cord stimulator status    01/08/21 - not currently using.   Squamous cell skin cancer    Substance abuse (HCC)    Syncope and collapse 2022    Surgical History: Past Surgical History:  Procedure Laterality Date   BACK SURGERY     BASAL CELL CARCINOMA EXCISION     BIOPSY  07/27/2023   Procedure: BIOPSY;  Surgeon: Therisa Bi, MD;  Location: [facility] ENDOSCOPY;  Service: Gastroenterology;;   CATARACT EXTRACTION W/PHACO Left 01/14/2021   Procedure: CATARACT EXTRACTION PHACO AND INTRAOCULAR LENS PLACEMENT (IOC) LEFT VIVITY TORIC LENS 8.75 00:56.7;  Surgeon: Jaye Fallow, MD;  Location: [facility];  Service: Ophthalmology;  Laterality: Left;   CATARACT EXTRACTION W/PHACO Right 01/28/2021   Procedure: CATARACT EXTRACTION PHACO AND INTRAOCULAR LENS PLACEMENT (IOC) RIGHT VIVITY TORIC LENS;  Surgeon: Jaye Fallow, MD;  Location: [facility];  Service: Ophthalmology;  Laterality: Right;  6.54 00:46.4   CHOLECYSTECTOMY  2017   COLONOSCOPY  09/15/2015   Per New Patient Packet   COLONOSCOPY N/A 01/26/2024   Procedure: COLONOSCOPY;  Surgeon: Therisa Bi, MD;  Location[facility];  Service: Gastroenterology;  Laterality: N/A;   COLONOSCOPY N/A 07/18/2024   Procedure: COLONOSCOPY;  Surgeon: Cinderella Deatrice FALCON, MD;  Location: [facility];  Service: Endoscopy;  Laterality: N/A;  9:00am, ASA 1-2   COLONOSCOPY WITH PROPOFOL  N/A 10/03/2020   Procedure: COLONOSCOPY WITH PROPOFOL ;  Surgeon: Therisa Bi, MD;  Location: [facility] ENDOSCOPY;  Service: Gastroenterology;  Laterality: N/A;   CORONARY ANGIOPLASTY  1998, 2023   CORONARY/GRAFT ACUTE MI REVASCULARIZATION N/A 11/27/2021   Procedure: Coronary/Graft Acute MI Revascularization;  Surgeon: Anner Alm ORN, MD;  Location: [facility] CATH: (NSTEMI) - > 100% prox-mid LCx (Onyx Frontier DES 2.5 x 26 -> 2.7 mm, Ost  OM1 65%.  Ost-mid LAD 3 lesions 75%, 90%, 99% => DES PCI Onyx Frontier DES 2.5 x 34 -> 2.8 mm    CYSTOSCOPY/URETEROSCOPY/HOLMIUM LASER/STENT PLACEMENT Left 06/26/2024   Procedure: CYSTOSCOPY/URETEROSCOPY/HOLMIUM LASER/STENT PLACEMENT;  Surgeon: Francisca Redell BROCKS, MD;  Location: [facility] ORS;  Service: Urology;  Laterality: Left;   ESOPHAGOGASTRODUODENOSCOPY N/A 07/18/2024   Procedure: EGD (ESOPHAGOGASTRODUODENOSCOPY);  Surgeon: Cinderella Deatrice FALCON, MD;  Location: [facility];  Service: Endoscopy;  Laterality: N/A;   ESOPHAGOGASTRODUODENOSCOPY (EGD) WITH PROPOFOL  N/A 10/03/2020   Procedure: ESOPHAGOGASTRODUODENOSCOPY (EGD) WITH PROPOFOL ;  Surgeon: Therisa Bi, MD;  Location: [facility] ENDOSCOPY;  Service: Gastroenterology;  Laterality: N/A;   ESOPHAGOGASTRODUODENOSCOPY (EGD) WITH PROPOFOL  N/A 07/27/2023   Procedure: ESOPHAGOGASTRODUODENOSCOPY (EGD) WITH PROPOFOL ;  Surgeon: Therisa,  Ruel, MD;  Location: [facility] ENDOSCOPY;  Service: Gastroenterology;  Laterality: N/A;   FOOT SURGERY Bilateral 03/18/2020   FOOT SURGERY  08/032021   GALLBLADDER SURGERY  09/15/2015   Gallbladder Removal. Procedure done by Dr.Beverly. Per New Patient Packet   KIDNEY STONE SURGERY  09/14/1980   Too many to count. Per New Patient Packet 09/14/1980-09/15/1995   KIDNEY STONE SURGERY  09/15/1995   Too many to count. Per New Patient Packet   LEFT HEART CATH AND CORONARY ANGIOGRAPHY N/A 11/27/2021   Procedure: LEFT HEART CATH AND CORONARY ANGIOGRAPHY;  Surgeon: Anner Alm ORN, MD;  Location: [facility] CATH:  NSTEMI - 2 V CAD: 100% thrombotic prox-mid LCx (DES PCI), ost OM1 65%; Ost LAD 75% - prox LAD 90%, prox-mid 99% (DES PCI).  MIld diffuse RCA disease - calcicifed.  R-L collaterals filling LCx. EF ~45-50% with lateral HK. LVEDP 28 mmHg   LITHOTRIPSY  09/14/2014   Per New Patient Packet   LITHOTRIPSY  09/14/1996   No Stints Used. Per New Patient Packet   LITHOTRIPSY  09/14/1997   No Stints used. Per New Patient Packet   PAIN PUMP IMPLANTATION     PAIN PUMP REMOVAL     SHOULDER SURGERY Right 1984   SIGMOIDOSCOPY  09/14/2017   Per  New Patient Packet   SPINAL CORD STIMULATOR IMPLANT     SPINE SURGERY  1999, 2000   Fusion, Inst. Fusion   TONSILLECTOMY     TRANSTHORACIC ECHOCARDIOGRAM  11/28/2021   (in setting of non-STEMI): EF 50 to 55%.  Suspect inferior and lateral hypokinesis.  Indeterminate diastolic parameters.  Normal RV size and function.  Normal RAP.  Normal aortic and mitral valves with only mild MR and AI.  Normal RVP and RAP.   UPPER GI ENDOSCOPY     VIDEO BRONCHOSCOPY WITH ENDOBRONCHIAL ULTRASOUND N/A 01/15/2023   Procedure: VIDEO BRONCHOSCOPY WITH ENDOBRONCHIAL ULTRASOUND;  Surgeon: Parris Manna, MD;  Location: [facility];  Service: Thoracic;  Laterality: N/A;    Home Medications:  Allergies as of 08/31/2024       Reactions   Ace Inhibitors    Other reaction(s): Cough   Fluoxetine Anxiety   made me fall asleep per pt bad headaches and makes  Me  Crazy historical allergy noted in [employer]   Metoclopramide    Other reaction(s): Other (See Comments), Other (See Comments), Unknown Tardive Dyskinesia  historical allergy noted in [employer]   Nalbuphine    Used Post Back surgery- Anesthesiologist Error. Patient had Narcotic Withdraw. Per New Patient Packet.    Other    Other reaction(s): Other (See Comments) Altered mental status in combo with narcotics at previous hospitalization - Full Withdrawal Symptoms Other reaction(s): Rash   Amoxicillin-pot Clavulanate Nausea Only   Per New Patient Packet.   Doxazosin Rash   Other reaction(s): Other - See Comments, Rash   Duloxetine Nausea Only   Per New Patient Packet.    Penicillins Nausea Only   Per New Patient Packet.   Tamsulosin Itching, Anxiety   Restless, Flushing, Heavy Chest, Itching, Hyperactive mood and Anxiety. Unable to handle side effects. Per New Patient Packet.    Trazodone  And Nefazodone Itching, Anxiety, Rash   Headache. INCREASED MY ANXIETY AND HEARTRATE Flushing, tachycardia INCREASED MY ANXIETY AND HEARTRATE Per New  Patient Packet.   Alirocumab  Other (See Comments)   Amlodipine    Shaking, unsure of reaction. Per New Patient Packet.    Cinoxacin    GI Intolerance, and Dizziness. Per New Patient Packet.  Ciprofloxacin    Other reaction(s): Unknown   Fludrocortisone  Other (See Comments)   Worsening headaches, GI issues, fatigue   Nebivolol    Other reaction(s): Unknown   Olanzapine    Headache and unable to sleep for 3 nights. Per New Patient Packet.   Olmesartan    Other reaction(s): Unknown   Pregabalin    Confusion, Lack of concentration, dizziness, and likely drowsiness. Per New Patient Packet.    Prostaglandins    Other reaction(s): Other (See Comments) Intolerance   Thyroid  Hormones    Other reaction(s): Other (See Comments) Thyroid  (Nature Thyroid ) contraindicated with some of your other medications.   Venlafaxine  Other (See Comments)   High blood pressure- hospitalized  venlafaxine    Venlafaxine  Hcl Er Hypertension   Zolpidem    Nightmares, Ineffective after 2 days. Per New Patient Packet.    Duloxetine Hcl    Other reaction(s): Rash   Fluoxetine Hcl    Other reaction(s): Rash   Nucynta  [tapentadol ] Other (See Comments)   Vertigo    Phenytoin Anxiety   Hyperactivity, and Ineffective. Per New Patient Packet.         Medication List        Accurate as of August 31, 2024  1:01 PM. If you have any questions, ask your nurse or doctor.          anastrozole  1 MG tablet Commonly known as: ARIMIDEX  Take 0.5 mg by mouth once a week.   aspirin  EC 81 MG tablet Take 1 tablet (81 mg total) by mouth daily. Swallow whole.   benzonatate  100 MG capsule Commonly known as: TESSALON  Take 1 capsule (100 mg total) by mouth every 8 (eight) hours.   Cholecalciferol  50 MCG (2000 UT) Caps Take 2,000 Units by mouth daily.   cholestyramine  4 g packet Commonly known as: QUESTRAN  Take 4 g by mouth daily as needed (diarrhea).   DHEA 25 MG Caps Take 25 mg by mouth daily.    dicyclomine  10 MG capsule Commonly known as: BENTYL  Take 1 capsule (10 mg total) by mouth 4 (four) times daily -  before meals and at bedtime.   escitalopram  20 MG tablet Commonly known as: LEXAPRO  TAKE 1 TABLET(20 MG) BY MOUTH DAILY   ezetimibe -simvastatin  10-20 MG tablet Commonly known as: VYTORIN  Take 1 tablet by mouth daily.   Gemtesa  75 MG Tabs Generic drug: Vibegron  Take 1 tablet (75 mg total) by mouth daily.   guaiFENesin -codeine  100-10 MG/5ML syrup Take 5 mLs by mouth every 6 (six) hours as needed for cough.   hydrALAZINE  25 MG tablet Commonly known as: APRESOLINE  Take 2 tabs p.o. in a.m. and q. afternoon, 1 tab p.o. in the evening   HYDROcodone -acetaminophen  10-325 MG tablet Commonly known as: NORCO Take 1 tablet by mouth every 12 (twelve) hours as needed.   ipratropium 0.03 % nasal spray Commonly known as: ATROVENT  Place 2 sprays into both nostrils every 12 (twelve) hours. What changed:  when to take this reasons to take this   ketoconazole 2 % shampoo Commonly known as: NIZORAL Apply 1 Application topically daily. With shower   linaclotide  72 MCG capsule Commonly known as: Linzess  Take 1 capsule (72 mcg total) by mouth daily before breakfast. What changed: Another medication with the same name was removed. Continue taking this medication, and follow the directions you see here.   loperamide  2 MG tablet Commonly known as: IMODIUM  A-D Take 2 mg by mouth 4 (four) times daily as needed for diarrhea or loose stools.  MAGNESIUM  BISGLYCINATE PO Take 1 tablet by mouth daily.   meclizine  25 MG tablet Commonly known as: ANTIVERT  Take 1 tablet (25 mg total) by mouth 3 (three) times daily as needed for dizziness.   melatonin 5 MG Tabs Take 5 mg by mouth at bedtime.   Miebo 1.338 GM/ML Soln Generic drug: Perfluorohexyloctane Place 1 drop into both eyes daily as needed (Dry eyes).   NONFORMULARY OR COMPOUNDED ITEM Bi-Mix Papaverine 30mg , Phentolamine 1mg     Dosage: Inject 0.25cc-0.5cc as need for ED   Vial 1ml   Qty #5 Refills 6   [facility] [phone] Fax [phone] What changed:  when to take this reasons to take this additional instructions   NUTRITIONAL SUPPLEMENT PO Take 1 capsule by mouth daily. [name]   ondansetron  4 MG disintegrating tablet Commonly known as: ZOFRAN -ODT Take 1 tablet (4 mg total) by mouth every 8 (eight) hours as needed for nausea or vomiting.   PreviDent 5000 Dry Mouth 1.1 % Gel dental gel Generic drug: sodium fluoride Place 1 Application onto teeth 3 (three) times daily.   PROBIOTIC DAILY PO Take 1 capsule by mouth daily.   propranolol  40 MG tablet Commonly known as: INDERAL  Take 1 tablet (40 mg total) by mouth 2 (two) times daily.   RABEprazole  20 MG tablet Commonly known as: ACIPHEX  Take 1 tablet (20 mg total) by mouth daily.   sulfamethoxazole -trimethoprim  800-160 MG tablet Commonly known as: BACTRIM  DS Take 1 tablet by mouth 2 (two) times daily for 7 days.   testosterone  cypionate 200 MG/ML injection Commonly known as: DEPOTESTOSTERONE CYPIONATE Inject 80 mg into the muscle every 14 (fourteen) days.   traZODone  100 MG tablet Commonly known as: DESYREL  Take 1 tablet (100 mg total) by mouth at bedtime.   triamcinolone cream 0.1 % Commonly known as: KENALOG as needed (skin irritation, insect bites).   valACYclovir  1000 MG tablet Commonly known as: VALTREX  Take 2 tabs p.o. and repeat in 12 hours as needed for cold sore   Vitamin B-12 5000 MCG Lozg Take 1 lozenge by mouth daily.        Allergies: Allergies[1]  Family History: Family History  Problem Relation Age of Onset   Heart disease Father    Heart failure Father    Hypertension Father    Stroke Father    Vision loss Father    Stroke Mother    Dementia Mother    Diabetes Mother    Kidney Stones Daughter    Anxiety disorder Daughter    OCD Daughter     Social History:   reports that he has never smoked. He has never been exposed to tobacco smoke. He has never used smokeless tobacco. He reports current alcohol use. He reports that he does not currently use drugs.  ROS: Pertinent ROS in HPI  Physical Exam: BP (!) 151/81   Pulse 61   Ht 5' 9 (1.753 m)   Wt 144 lb (65.3 kg)   BMI 21.27 kg/m   Constitutional:  Well nourished. Alert and oriented, No acute distress. HEENT: [place] AT, moist mucus membranes.  Trachea midline Cardiovascular: No clubbing, cyanosis, or edema. Respiratory: Normal respiratory effort, no increased work of breathing. Neurologic: Grossly intact, no focal deficits, moving all 4 extremities. Psychiatric: Normal mood and affect.  Laboratory Data: See Epic and HPI   I have reviewed the labs.   Pertinent Imaging:  08/31/24 11:43  Scan Result >215ml 0ml (C)  (C): Corrected  Assessment & Plan:  1. Recurrent UTI (Primary) - BLADDER SCAN AMB NON-IMAGING-shows adequate emptying - Urinalysis, Complete -with pyuria, urine cultures from yesterday are pending - Continue Bactrim  DS until culture results are available, will adjust as needed  - Schedule renal ultrasound for further evaluation of his bilateral flank pain and recurrent UTIs   Return in about 1 month (around 10/01/2024) for  RUS results .  These notes generated with voice recognition software. I apologize for typographical errors.  CLOTILDA CORNWALL, PA-C  [facility] [address]  [address] [place], [place] [address] [phone]      [1]  Allergies Allergen Reactions   Ace Inhibitors     Other reaction(s): Cough   Fluoxetine Anxiety    made me fall asleep per pt bad headaches and makes  Me  Crazy historical allergy noted in [employer]   Metoclopramide     Other reaction(s): Other (See Comments), Other (See Comments), Unknown Tardive Dyskinesia  historical allergy noted in [employer]   Nalbuphine     Used Post Back surgery-  Anesthesiologist Error. Patient had Narcotic Withdraw. Per New Patient Packet.    Other     Other reaction(s): Other (See Comments) Altered mental status in combo with narcotics at previous hospitalization - Full Withdrawal Symptoms Other reaction(s): Rash   Amoxicillin-Pot Clavulanate Nausea Only    Per New Patient Packet.   Doxazosin Rash    Other reaction(s): Other - See Comments, Rash     Duloxetine Nausea Only    Per New Patient Packet.    Penicillins Nausea Only    Per New Patient Packet.   Tamsulosin Itching and Anxiety    Restless, Flushing, Heavy Chest, Itching, Hyperactive mood and Anxiety. Unable to handle side effects. Per New Patient Packet.     Trazodone  And Nefazodone Itching, Anxiety and Rash    Headache. INCREASED MY ANXIETY AND HEARTRATE Flushing, tachycardia INCREASED MY ANXIETY AND HEARTRATE Per New Patient Packet.    Alirocumab  Other (See Comments)   Amlodipine     Shaking, unsure of reaction. Per New Patient Packet.     Cinoxacin     GI Intolerance, and Dizziness. Per New Patient Packet.    Ciprofloxacin     Other reaction(s): Unknown   Fludrocortisone  Other (See Comments)    Worsening headaches, GI issues, fatigue   Nebivolol     Other reaction(s): Unknown   Olanzapine     Headache and unable to sleep for 3 nights. Per New Patient Packet.    Olmesartan     Other reaction(s): Unknown   Pregabalin     Confusion, Lack of concentration, dizziness, and likely drowsiness. Per New Patient Packet.     Prostaglandins     Other reaction(s): Other (See Comments) Intolerance   Thyroid  Hormones     Other reaction(s): Other (See Comments) Thyroid  (Nature Thyroid ) contraindicated with some of your other medications.   Venlafaxine  Other (See Comments)    High blood pressure- hospitalized   venlafaxine    Venlafaxine  Hcl Er Hypertension   Zolpidem     Nightmares, Ineffective after 2 days. Per New Patient Packet.    Duloxetine Hcl     Other  reaction(s): Rash   Fluoxetine Hcl     Other reaction(s): Rash   Nucynta  [Tapentadol ] Other (See Comments)    Vertigo    Phenytoin Anxiety    Hyperactivity, and Ineffective. Per New Patient Packet.

## 2024-08-31 NOTE — Op Note (Signed)
 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 Estimated Blood Loss:     Estimated blood loss: none. Procedure:                Pre-Anesthesia Assessment:                           - Prior to the procedure, a History and Physical                            was performed, and patient medications and                            allergies were reviewed. The patient's tolerance of                            previous anesthesia was also reviewed. The risks                            and benefits of the procedure and the sedation                            options and risks were discussed with the patient.                            All questions were answered, and informed consent                            was obtained. Prior Anticoagulants: The patient has                            taken no anticoagulant or antiplatelet agents                            except for aspirin . ASA Grade Assessment: III - A                            patient with  severe systemic disease. After                            reviewing the risks and benefits, the patient was                            deemed in satisfactory condition to undergo the                            procedure.                           After obtaining informed consent, the endoscope was                            passed under direct vision. Throughout the                             procedure, the patient's blood pressure, pulse, and                            oxygen saturations were monitored continuously. The                            [id number]-[id number] ([id number]) Olympus endoscope was introduced                            through the mouth, and advanced to the second part                            of duodenum. The RADIAL EUS ( [id number] ) [id number] was                            introduced through the mouth, and advanced to the                            duodenum for ultrasound examination from the                            esophagus, stomach and duodenum. The [id number]                            ([id number]) Olympus endosonoscope was introduced                            through the mouth, and advanced to the duodenum for                            ultrasound examination from the esophagus, stomach                            and duodenum. The upper EUS was accomplished  without difficulty. The patient tolerated the                            procedure. Scope In: Scope Out: Findings:      ENDOSCOPIC FINDING: :      No gross lesions were noted in the entire esophagus.      The Z-line was irregular and was found 40 cm from the incisors.      A non-obstructing Schatzki ring was found at the gastroesophageal       junction.      A 3 cm hiatal hernia was present.      Extrinsic compression on the stomach was found at the incisura.      No gross lesions were noted in the duodenal bulb, in the first portion       of the duodenum and in the second portion of the duodenum.      ENDOSONOGRAPHIC FINDING: :      There was extrinsic compression in the incisura of the stomach.       Endosonographic examination showed this compression to be due to a       normal-appearing liver.      Endosonographic imaging in the entire stomach showed no mass.      There was no sign of significant endosonographic abnormality in the       pancreatic  head, genu of the pancreas, pancreatic body and pancreatic       tail. No masses, no cysts, the pancreatic duct was regular in contour.      There was no sign of significant endosonographic abnormality in the       common bile duct and in the common hepatic duct. Ducts with regular       contour were identified.      Endosonographic imaging of the ampulla showed no intramural       (subepithelial) lesion.      Endosonographic imaging in the gastroesophageal junction showed no wall       thickening.      Endosonographic imaging in the visualized portion of the liver showed no       mass.      No malignant-appearing lymph nodes were visualized in the left gastric       region (level 17), gastrohepatic ligament (level 18), splenic region       (level 19) and celiac region (level 20).      The celiac region was visualized.      The esophagus, stomach and duodenum were examined endosonographically. Impression:               EGD Impression:                           - No gross lesions in the entire esophagus. Z-line                            irregular, 40 cm from the incisors.                           - Non-obstructing Schatzki ring.                           - 3 cm hiatal hernia.                           -  Extrinsic compression in the incisura.                           - No gross lesions in the duodenal bulb, in the                            first portion of the duodenum and in the second                            portion of the duodenum.                           EUS Impression:                           - Extrinsic compression was noted in the incisura                            of the stomach due to a normal-appearing liver. No                            SEL noted in the region. No other evidence of any                            mass/lesion noted within the stomach.                           - There was no sign of significant pathology in the                            pancreatic  head, genu of the pancreas, pancreatic                            body and pancreatic tail.                           - There was no sign of significant pathology in the                            common bile duct and in the common hepatic duct.                           - No malignant-appearing lymph nodes were                            visualized in the left gastric region (level 17),                            gastrohepatic ligament (level 18), splenic region                            (level 19) and celiac region (level 20). Moderate Sedation:      Not Applicable - Patient had care per Anesthesia. Recommendation:           -  The patient will be observed post-procedure,                            until all discharge criteria are met.                           - Discharge patient to home.                           - Patient has a contact number available for                            emergencies. The signs and symptoms of potential                            delayed complications were discussed with the                            patient. Return to normal activities tomorrow.                            Written discharge instructions were provided to the                            patient.                           - Resume previous diet.                           - Observe patient's clinical course.                           - No evidence of mass/lesion on imaging or on EUS                            at this time. Etiology of patient's discomfort                            remains unclear and will need further workup with                            referring provider.                           - The findings and recommendations were discussed                            with the patient.                           - The findings and recommendations were discussed                            with the patient's family. Procedure Code(s):        --- Professional ---  [phone], Esophagogastroduodenoscopy, flexible,                            transoral; with endoscopic ultrasound examination,                            including the esophagus, stomach, and either the                            duodenum or a surgically altered stomach where the                            jejunum is examined distal to the anastomosis Diagnosis Code(s):        --- Professional ---                           K22.89, Other specified disease of esophagus                           K22.2, Esophageal obstruction                           K44.9, Diaphragmatic hernia without obstruction or                            gangrene                           K31.89, Other diseases of stomach and duodenum                           I89.9, Noninfective disorder of lymphatic vessels                            and lymph nodes, unspecified CPT copyright 2022 [employer]. All rights reserved. The codes documented in this report are preliminary and upon coder review may  be revised to meet current compliance requirements. Aloha Finner, MD 08/31/2024 5:01:11 PM Number of Addenda: 0

## 2024-08-31 NOTE — Discharge Instructions (Signed)

## 2024-08-31 NOTE — Transfer of Care (Signed)
 Immediate Anesthesia Transfer of Care Note  Patient: Grant Young  Procedure(s) Performed: ULTRASOUND, UPPER GI TRACT, ENDOSCOPIC  Patient Location: PACU  Anesthesia Type:MAC  Level of Consciousness: sedated, patient cooperative, and responds to stimulation  Airway & Oxygen Therapy: Patient Spontanous Breathing and Patient connected to face mask oxygen  Post-op Assessment: Report given to RN and Post -op Vital signs reviewed and stable  Post vital signs: Reviewed and stable  Last Vitals:  Vitals Value Taken Time  BP 140/62 08/31/24 14:50  Temp    Pulse 62 08/31/24 14:51  Resp 13 08/31/24 14:51  SpO2 97 % 08/31/24 14:51  Vitals shown include unfiled device data.  Last Pain:  Vitals:   08/31/24 1316  TempSrc: Temporal  PainSc: 4          Complications: No notable events documented.

## 2024-09-01 ENCOUNTER — Ambulatory Visit (HOSPITAL_COMMUNITY): Payer: Self-pay

## 2024-09-01 LAB — URINE CULTURE: Culture: >=100000 — AB

## 2024-09-03 ENCOUNTER — Encounter (HOSPITAL_COMMUNITY): Payer: Self-pay | Admitting: Gastroenterology

## 2024-09-05 ENCOUNTER — Encounter: Payer: Self-pay | Admitting: Internal Medicine

## 2024-09-05 ENCOUNTER — Ambulatory Visit
Admission: RE | Admit: 2024-09-05 | Discharge: 2024-09-05 | Disposition: A | Discharge status: Home / Self Care | Source: Ambulatory Visit | Attending: Urology | Admitting: Urology

## 2024-09-05 DIAGNOSIS — N390 Urinary tract infection, site not specified: Secondary | ICD-10-CM | POA: Insufficient documentation

## 2024-09-12 ENCOUNTER — Encounter (INDEPENDENT_AMBULATORY_CARE_PROVIDER_SITE_OTHER): Payer: Self-pay

## 2024-09-12 ENCOUNTER — Telehealth (INDEPENDENT_AMBULATORY_CARE_PROVIDER_SITE_OTHER): Payer: Self-pay | Admitting: Gastroenterology

## 2024-09-12 NOTE — Telephone Encounter (Signed)
 Please advice patient that I received results of his EUS which was negative to explain cause of his chronic abdominal pain  At this time workup for chronic abdominal pain is completed with negative upper endoscopy , colonoscopy, upper EUS and CTA abdomen pelvis. I do recommend patient to continue following with Dr. Lateef for possible abdominal block and pain control.  He can continue to see us  in the GI clinic in 2-3 months to re-assess his symptoms

## 2024-09-12 NOTE — Telephone Encounter (Signed)
 Pt left voicemail stating he would like to know the next steps. Had EUS on 12/18 with Dr.Mansouraty. Pt also wants to know if the EUS clears him for blocked abdomen Returned call to patient. Dr.Mansouraty went over the results with his wife and him and put the results on mychart. Pt is wanting to know does he need to come back in for office visit? Does he need virtual visit? I asked patient about the blocked abdomen. Patient states he goes to [facility] and Dr. Marcelino wanted him to be cleared by Dr.Ahmed to do a pain block in his abdomen. Please advise. Thank you.

## 2024-09-12 NOTE — Telephone Encounter (Signed)
 Transferred call to Dr Orion nurse. [phone]

## 2024-09-12 NOTE — Telephone Encounter (Signed)
Left message to return call. Also sent my chart message to patient

## 2024-09-12 NOTE — Telephone Encounter (Signed)
 Pt replied via my chart

## 2024-09-13 ENCOUNTER — Encounter: Payer: Self-pay | Admitting: Student in an Organized Health Care Education/Training Program

## 2024-09-17 ENCOUNTER — Emergency Department
Admission: EM | Admit: 2024-09-17 | Discharge: 2024-09-17 | Disposition: A | Discharge status: Home / Self Care | Attending: Emergency Medicine | Admitting: Emergency Medicine

## 2024-09-17 ENCOUNTER — Emergency Department

## 2024-09-17 ENCOUNTER — Other Ambulatory Visit: Payer: Self-pay

## 2024-09-17 DIAGNOSIS — R1013 Epigastric pain: Secondary | ICD-10-CM | POA: Diagnosis not present

## 2024-09-17 DIAGNOSIS — M545 Low back pain, unspecified: Secondary | ICD-10-CM | POA: Insufficient documentation

## 2024-09-17 DIAGNOSIS — R0789 Other chest pain: Secondary | ICD-10-CM | POA: Insufficient documentation

## 2024-09-17 DIAGNOSIS — I10 Essential (primary) hypertension: Secondary | ICD-10-CM | POA: Diagnosis not present

## 2024-09-17 DIAGNOSIS — I2510 Atherosclerotic heart disease of native coronary artery without angina pectoris: Secondary | ICD-10-CM | POA: Diagnosis not present

## 2024-09-17 DIAGNOSIS — R1032 Left lower quadrant pain: Secondary | ICD-10-CM | POA: Diagnosis not present

## 2024-09-17 LAB — URINALYSIS, ROUTINE W REFLEX MICROSCOPIC
Bilirubin Urine: NEGATIVE
Glucose, UA: NEGATIVE mg/dL
Hgb urine dipstick: NEGATIVE
Ketones, ur: NEGATIVE mg/dL
Leukocytes,Ua: NEGATIVE
Nitrite: NEGATIVE
Protein, ur: NEGATIVE mg/dL
Specific Gravity, Urine: 1.017 (ref 1.005–1.030)
pH: 6 (ref 5.0–8.0)

## 2024-09-17 LAB — BASIC METABOLIC PANEL WITH GFR
Anion gap: 10 (ref 5–15)
BUN: 14 mg/dL (ref 8–23)
CO2: 26 mmol/L (ref 22–32)
Calcium: 8.9 mg/dL (ref 8.9–10.3)
Chloride: 106 mmol/L (ref 98–111)
Creatinine, Ser: 0.72 mg/dL (ref 0.61–1.24)
GFR, Estimated: >60 mL/min
Glucose, Bld: 93 mg/dL (ref 70–99)
Potassium: 3.9 mmol/L (ref 3.5–5.1)
Sodium: 142 mmol/L (ref 135–145)

## 2024-09-17 LAB — HEPATIC FUNCTION PANEL
ALT: 15 U/L (ref 0–44)
AST: 20 U/L (ref 15–41)
Albumin: 4.1 g/dL (ref 3.5–5.0)
Alkaline Phosphatase: 95 U/L (ref 38–126)
Bilirubin, Direct: 0.2 mg/dL (ref 0.0–0.2)
Indirect Bilirubin: 0.2 mg/dL — ABNORMAL LOW (ref 0.3–0.9)
Total Bilirubin: 0.4 mg/dL (ref 0.0–1.2)
Total Protein: 6.1 g/dL — ABNORMAL LOW (ref 6.5–8.1)

## 2024-09-17 LAB — CBC
HCT: 41.3 % (ref 39.0–52.0)
Hemoglobin: 13.4 g/dL (ref 13.0–17.0)
MCH: 29.6 pg (ref 26.0–34.0)
MCHC: 32.4 g/dL (ref 30.0–36.0)
MCV: 91.2 fL (ref 80.0–100.0)
Platelets: 196 K/uL (ref 150–400)
RBC: 4.53 MIL/uL (ref 4.22–5.81)
RDW: 13.5 % (ref 11.5–15.5)
WBC: 6.8 K/uL (ref 4.0–10.5)
nRBC: 0 % (ref 0.0–0.2)

## 2024-09-17 LAB — LIPASE, BLOOD: Lipase: 23 U/L (ref 11–51)

## 2024-09-17 LAB — TROPONIN T, HIGH SENSITIVITY
Troponin T High Sensitivity: <15 ng/L (ref 0–19)
Troponin T High Sensitivity: <15 ng/L (ref 0–19)

## 2024-09-17 MED ORDER — FAMOTIDINE 20 MG PO TABS
20.0000 mg | ORAL_TABLET | Freq: Once | ORAL | Status: AC
  Administered 2024-09-17: 20 mg via ORAL
  Filled 2024-09-17: qty 1

## 2024-09-17 MED ORDER — SUCRALFATE 1 G PO TABS
1.0000 g | ORAL_TABLET | Freq: Once | ORAL | Status: AC
  Administered 2024-09-17: 1 g via ORAL
  Filled 2024-09-17: qty 1

## 2024-09-17 NOTE — ED Triage Notes (Signed)
 Pt comes with cp that has been on going for months and belly pain. Pt states this morning at 7am the pain in his chest got worse so he came here. Pt states mid sternal pain. Pt denies any sob.

## 2024-09-17 NOTE — ED Notes (Signed)
 See triage note  Presents with some chest discomfort and flank pain  States the sx's have been going on for months  Denies any fever,urinary sxs;

## 2024-09-17 NOTE — ED Provider Notes (Signed)
 "  [facility] Provider Note    Event Date/Time   First MD Initiated Contact with Patient 09/17/24 1206     (approximate)   History   Chest Pain   HPI  Grant Young is a 79 y.o. male with a history of CAD, hypertension, hyperlipidemia, GERD, kidney stones, and ascending aortic aneurysm who presents with chest and abdominal pain.  The patient reports two primary symptoms.  He has had chronic mainly epigastric pain for months which is being worked up by multiple outpatient physicians including a recent upper endoscopy.  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  He is on Aciphex .  However, this morning he started to have pain more in the middle of his chest described as a pressure, like an elephant sitting on his chest.  He denies associated shortness of breath or lightheadedness.  He has no nausea or vomiting.  However, he states that the pain today does feel similar to what he felt during his prior 2 MIs.  The chest pain has actually moved down towards the epigastric area, more in the location of his chronic pain.  He also reports an acute pain in the left flank near the area of his spinal stimulator (the spinal stimulator is not currently functional).  He denies any associated urinary symptoms.  He has had some diarrhea this morning.  I reviewed the past medical records.  The patient's most recent outpatient encounter was on 12/18 with gastroenterology for an EGD.  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  There is also stable ascending aortic aneurysm.   Physical Exam   Triage Vital Signs: ED Triage Vitals  Encounter Vitals Group     BP 09/17/24 1124 (!) 187/76     Girls Systolic BP Percentile --      Girls Diastolic BP Percentile --      Boys Systolic BP Percentile --      Boys Diastolic BP Percentile --      Pulse Rate 09/17/24 1124 60     Resp 09/17/24 1124 19     Temp 09/17/24 1124 98 F  (36.7 C)     Temp src --      SpO2 09/17/24 1124 97 %     Weight 09/17/24 1123 145 lb (65.8 kg)     Height 09/17/24 1123 5' 9 (1.753 m)     Head Circumference --      Peak Flow --      Pain Score 09/17/24 1123 7     Pain Loc --      Pain Education --      Exclude from Growth Chart --     Most recent vital signs: Vitals:   09/17/24 1124 09/17/24 1551  BP: (!) 187/76 (!) 170/80  Pulse: 60 75  Resp: 19   Temp: 98 F (36.7 C) 98 F (36.7 C)  SpO2: 97% 99%     General: Alert, well-appearing, no distress.  CV:  Good peripheral perfusion.  Normal heart sounds. Resp:  Normal effort.  Lungs CTAB. Abd:  Soft and nontender.  No distention.  Other:  No CVA tenderness.  Left lower back/flank with no skin lesions or swelling.   ED Results / Procedures / Treatments   Labs (all labs ordered are listed, but only abnormal results are displayed) Labs Reviewed  URINALYSIS, ROUTINE W REFLEX MICROSCOPIC - Abnormal; Notable for the following components:  Result Value   Color, Urine YELLOW (*)    APPearance CLEAR (*)    All other components within normal limits  HEPATIC FUNCTION PANEL - Abnormal; Notable for the following components:   Total Protein 6.1 (*)    Indirect Bilirubin 0.2 (*)    All other components within normal limits  BASIC METABOLIC PANEL WITH GFR  CBC  LIPASE, BLOOD  TROPONIN T, HIGH SENSITIVITY  TROPONIN T, HIGH SENSITIVITY     EKG  ED ECG REPORT I, Waylon Cassis, the attending physician, personally viewed and interpreted this ECG.  Date: 09/17/2024 EKG Time: 1124 Rate: 60 Rhythm: normal sinus rhythm QRS Axis: Left axis Intervals: RBBB ST/T Wave abnormalities: normal Narrative Interpretation: no evidence of acute ischemia    RADIOLOGY  Chest x-ray: I independently viewed and interpreted the images; there is right midlung opacity with no other focal consolidation or edema.  CT renal stone study:  IMPRESSION:  1. Bilateral nonobstructive  nephrolithiasis.  2. Small sliding-type hiatal hernia.  3. No acute abnormality seen in the abdomen or pelvis.  4. Aortic atherosclerosis.    PROCEDURES:  Critical Care performed: No  Procedures   MEDICATIONS ORDERED IN ED: Medications  famotidine  (PEPCID ) tablet 20 mg (20 mg Oral Given 09/17/24 1301)  sucralfate  (CARAFATE ) tablet 1 g (1 g Oral Given 09/17/24 1301)     IMPRESSION / MDM / ASSESSMENT AND PLAN / ED COURSE  I reviewed the triage vital signs and the nursing notes.  79 year old male with PMH as noted above presents with acute chest pain superimposed on more subacute to chronic epigastric pain that he is being worked up for currently as an outpatient.  He also reports acute left flank pain that developed while he has been in the ED.  Differential diagnosis includes, but is not limited to:  Chest pain: GERD, musculoskeletal pain, neuropathic pain, ACS, esophageal stricture.  There is no evidence of aortic dissection or other vascular etiology especially with this migratory pain.  There is no clinical evidence for PE.  The patient cannot be ruled out by [facility] due to age but has no other risk factors.  Chest x-ray is clear.  Initial troponin is negative.  EKG is nonischemic.  We will obtain a second troponin.  Epigastric pain: This is subacute to chronic.  There is no indication for further workup other than labs.  Left flank pain: This began acutely while he was in the ED.  He has no significant tenderness there.  Differential includes musculoskeletal pain, radiculopathy or other neuropathic pain, ureteral stone, UTI.  We will obtain a CT for further evaluation.  Patient's presentation is most consistent with acute complicated illness / injury requiring diagnostic workup.  ----------------------------------------- 3:31 PM on 09/17/2024 -----------------------------------------  Second troponin is negative.  CT shows no acute abnormality.  On reassessment, the patient  continues to appear comfortable.  He states that the pain has not really resolved but has migrated around and is now more in the normal area of his subacute to chronic epigastric pain.  It is unchanged from his baseline.  The patient expresses frustration about how none of his specialist have been able to find a reason for his epigastric pain.  He states that he is following up with GI and with pain management, and they had discussed doing some type of nerve block.  At this time, there is no evidence of ACS or other acute cause of the patient's chest pain episode today.  There is no further ED  workup indicated for his more chronic abdominal pain given the reassuring imaging and lab workup.  The patient feels comfortable going home.  I counseled him on the results of the workup and plan of care, and I answered all of his questions.  I gave strict return precautions, and he expressed understanding.   FINAL CLINICAL IMPRESSION(S) / ED DIAGNOSES   Final diagnoses:  Atypical chest pain     Rx / DC Orders   ED Discharge Orders     None        Note:  This document was prepared using Dragon voice recognition software and may include unintentional dictation errors.    Jacolyn Pae, MD 09/17/24 [phone]  "

## 2024-09-17 NOTE — Discharge Instructions (Signed)
 Follow-up with your primary care provider and your pain management specialist as scheduled.  Return to the ER for new, worsening, or persistent severe chest pain, new or unusual abdominal pain, difficulty breathing, weakness or lightheadedness, vomiting, high fever, or any other new or worsening symptoms that concern you.

## 2024-09-18 NOTE — Progress Notes (Unsigned)
 PROVIDER NOTE: Interpretation of information contained herein should be left to medically-trained personnel. Specific patient instructions are provided elsewhere under Patient Instructions section of medical record. This document was created in part using AI and STT-dictation technology, any transcriptional errors that may result from this process are unintentional.  Patient: Grant Young  Service: E/M   PCP: Antonette Angeline LELON, NP  DOB: [birth year]  DOS: 09/20/2024  Provider: Emmy MARLA Blanch, NP  MRN: [id number]  Delivery: Face-to-face  Specialty: Interventional Pain Management  Type: Established Patient  Setting: Ambulatory outpatient facility  Specialty designation: 09  Referring Prov.: Antonette Angeline LELON, NP  Location: Outpatient office facility       History of present illness (HPI) [name], a 79 y.o. year old male, is here today because of his abdominal pain and chest pain. Mr. Ewan primary complain today is Abdominal Pain and Chest Pain  Pertinent problems: Mr. 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  Pain Assessment: Severity of Chronic pain is reported as a 7 /10. Location: Abdomen Upper/Up and Down from chest to Abdomen. Onset: More than a month ago. Quality: Aching, Penetrating, Burning, Sharp. Timing: Constant. Modifying factor(s): Heating pad, Pain Medication. Vitals:  height is 5' 9 (1.753 m) and weight is 144 lb (65.3 kg). His temporal temperature is 98.1 F (36.7 C). His blood pressure is 170/83 (abnormal) and his pulse is 54 (abnormal). His respiration is 18 and oxygen saturation is 99%.  BMI: Estimated body mass index is 21.27 kg/m as calculated from the following:   Height as of this encounter: 5' 9  (1.753 m).   Weight as of this encounter: 144 lb (65.3 kg).  Last encounter: 08/23/2024. Last procedure: Visit date not found.  Reason for encounter: medication management. No change in medical history since last visit.  Patient's pain is at baseline.  Patient continues multimodal pain regimen as prescribed.  States that it provides pain relief and improvement in functional status.   Discussed the use of AI scribe software for clinical note transcription with the patient, who gave verbal consent to proceed.  History of Present Illness   Grant Young is a 79 year old male who presents with abdominal and chest pain. He was referred by Dr. Cinderella for evaluation of abdominal and chest pain.  He has been experiencing sharp pain in the abdominal and chest area, specifically below the xiphoid process and higher in the chest. Eating somewhat alleviates the pain as it 'fills up the stomach and it doesn't react as much'.  He has a long history of GERD. An ultrasound revealed the liver pressing on the stomach, but it was deemed not problematic. A CT scan showed bilateral nonobstructive nephrolithiasis. He has previously had kidney stones removed from the left side.  He underwent an endoscopy, colonoscopy, and an ultrasound of the stomach, none of which revealed a cause for his pain. A chest x-ray and troponin tests were also performed in ER, both returning negative results.  Reports sharp pain in the chest and abdominal area.     Pharmacotherapy Assessment   Norco 10-325 mg tablet every 12 hours as needed for pain. MME=20 Monitoring: Grant Young PMP: PDMP reviewed during this encounter.       Pharmacotherapy: No side-effects or adverse reactions  reported. Compliance: No problems identified. Effectiveness: Clinically acceptable.  Erlene Doyal SAUNDERS, [place]  09/20/2024 11:43 AM  Sign when Signing Visit Nursing Pain Medication Assessment:  Safety precautions to be maintained throughout the outpatient stay will  include: orient to surroundings, keep bed in low position, maintain call bell within reach at all times, provide assistance with transfer out of bed and ambulation.  Medication Inspection Compliance: Pill count conducted under aseptic conditions, in front of the patient. Neither the pills nor the bottle was removed from the patient's sight at any time. Once count was completed pills were immediately returned to the patient in their original bottle.  Medication: Hydrocodone /APAP Pill/Patch Count: 9 of 60 pills/patches remain Pill/Patch Appearance: Markings consistent with prescribed medication Bottle Appearance: Standard pharmacy container. Clearly labeled. Filled Date: 47 / 10 / 2025 Last Medication intake:  Today    UDS:  Summary  Date Value Ref Range Status  09/17/2020 Note  Final    Comment:    ==================================================================== Compliance Drug Analysis, Ur ==================================================================== Specimen Alert Note: Urinary creatinine is low; ability to detect some drugs may be compromised. Interpret results with caution. (Creatinine) ==================================================================== Test                             Result       Flag       Units  Drug Present and Declared for Prescription Verification   Lorazepam                       1107         EXPECTED   ng/mg creat    Source of lorazepam  is a scheduled prescription medication.    Butalbital                      PRESENT      EXPECTED   Citalopram                     PRESENT      EXPECTED   Desmethylcitalopram            PRESENT      EXPECTED    Desmethylcitalopram is an expected metabolite of citalopram or the    enantiomeric form, escitalopram .    Acetaminophen                   PRESENT      EXPECTED   Propranolol                     PRESENT      EXPECTED  Drug Present not Declared for Prescription Verification   Oxazepam                        300          UNEXPECTED ng/mg creat   Temazepam                      357          UNEXPECTED ng/mg creat    Oxazepam and temazepam are expected metabolites of diazepam .    Oxazepam is also an expected metabolite of other benzodiazepine    drugs, including chlordiazepoxide, prazepam, clorazepate, halazepam,    and temazepam.  Oxazepam and temazepam are available as scheduled    prescription medications.  Drug Absent but Declared for Prescription Verification   Hydrocodone   Not Detected UNEXPECTED ng/mg creat   Tizanidine                      Not Detected UNEXPECTED    Tizanidine , as indicated in the declared medication list, is not    always detected even when used as directed.    Amitriptyline                   Not Detected UNEXPECTED   Prochlorperazine                Not Detected UNEXPECTED   Salicylate                     Not Detected UNEXPECTED    Aspirin , as indicated in the declared medication list, is not always    detected even when used as directed.  ==================================================================== Test                      Result    Flag   Units      Ref Range   Creatinine              14        LL     mg/dL      >=79 ==================================================================== Declared Medications:  The flagging and interpretation on this report are based on the  following declared medications.  Unexpected results may arise from  inaccuracies in the declared medications.   **Note: The testing scope of this panel includes these medications:   Amitriptyline  (Elavil )  Butalbital  (Fioricet )  Butalbital  (Fiorinal )  Escitalopram  (Lexapro )  Hydrocodone  (Norco)  Lorazepam  (Ativan )  Prochlorperazine  (Compazine )  Propranolol  (Inderal )   **Note: The testing scope of this panel does not include small to  moderate amounts of these reported medications:   Acetaminophen  (Fioricet )  Acetaminophen  (Norco)  Aspirin  (Fiorinal )   Tizanidine  (Zanaflex )   **Note: The testing scope of this panel does not include the  following reported medications:   Anastrozole  (Arimidex )  Caffeine  (Fioricet )  Caffeine  (Fiorinal )  Chlorthalidone  (Hygroton )  Cholecalciferol   Cholestyramine  (Questran )  Dicyclomine  (Bentyl )  Ezetimibe  (Zetia )  Hydrocortisone   Melatonin  Ondansetron  (Zofran )  Potassium (Klor-Con )  Rabeprazole  (Aciphex )  Sucralfate  (Carafate )  Valacyclovir  (Valtrex )  Vitamin B12 ==================================================================== For clinical consultation, please call [phone]. ====================================================================     No results found for: CBDTHCR No results found for: D8THCCBX No results found for: D9THCCBX  ROS  Constitutional: Denies any fever or chills Gastrointestinal: No reported hemesis, hematochezia, vomiting, or acute GI distress Musculoskeletal: Abdominal pain, chest pain Neurological: No reported episodes of acute onset apraxia, aphasia, dysarthria, agnosia, amnesia, paralysis, loss of coordination, or loss of consciousness  Medication Review  Cholecalciferol , DHEA, HYDROcodone -acetaminophen , Magnesium  Bisglycinate, NONFORMULARY OR COMPOUNDED ITEM, Nutritional Supplements, Perfluorohexyloctane, Probiotic Product, RABEprazole , UNABLE TO FIND, Vibegron , Vitamin B-12, anastrozole , aspirin  EC, benzonatate , cholestyramine , dicyclomine , escitalopram , ezetimibe -simvastatin , hydrALAZINE , ipratropium, ketoconazole, linaclotide , loperamide , meclizine , melatonin, ondansetron , propranolol , sodium fluoride, testosterone  cypionate, traZODone , triamcinolone cream, and valACYclovir   History Review  Allergy: Mr. Magos is allergic to ace inhibitors, fluoxetine, metoclopramide, nalbuphine, other, amoxicillin-pot clavulanate, doxazosin, duloxetine, penicillins, tamsulosin, trazodone  and nefazodone, alirocumab , amlodipine, cinoxacin, ciprofloxacin,  fludrocortisone , nebivolol, olanzapine, olmesartan, pregabalin, prostaglandins, thyroid  hormones, venlafaxine , venlafaxine  hcl er, zolpidem, duloxetine hcl, fluoxetine hcl, nucynta  [tapentadol ], and phenytoin. Drug: Mr. Heldman  reports that he does not currently use drugs. Alcohol:  reports current alcohol use. Tobacco:  reports that he has never smoked. He has never been exposed to tobacco smoke. He has never used  smokeless tobacco. Social: Mr. Engelstad  reports that he has never smoked. He has never been exposed to tobacco smoke. He has never used smokeless tobacco. He reports current alcohol use. He reports that he does not currently use drugs. Medical:  has a past medical history of Allergy, Anemia, Aneurysm of ascending aorta, Anxiety, Aortic atherosclerosis, Cataract (2015), Chronic back pain, Chronic kidney disease (1980s), Clotting disorder (01-2022), Coronary artery disease, Depression, Erectile dysfunction, GERD (gastroesophageal reflux disease), h/o Lyme disease, Headache, Hiatal hernia, History of bilateral cataract extraction (01/2021), History of gastritis, History of kidney stones, History of neuropathy, Hyperlipidemia LDL goal <70, Hypertension, Insomnia, Left lower lobe pulmonary nodule, Long term current use of antithrombotics/antiplatelets, Long term current use of aromatase inhibitor, Lumbar radicular pain, Myocardial infarction [facility]), NSTEMI (non-ST elevated myocardial infarction) (HCC) (11/27/2021), Overactive bladder, Pneumonia, PONV (postoperative nausea and vomiting), Recurrent herpes labialis, Skin cancer, basal cell, Spinal cord stimulator status, Squamous cell skin cancer, Substance abuse (HCC), and Syncope and collapse (2022). Surgical: Mr. Arnone  has a past surgical history that includes Kidney stone surgery (09/14/1980); Kidney stone surgery (09/15/1995); Lithotripsy (09/14/2014); Lithotripsy (09/14/1996); Lithotripsy (09/14/1997); Gallbladder surgery (09/15/2015); Sigmoidoscopy  (09/14/2017); Colonoscopy (09/15/2015); Cholecystectomy (2017); Shoulder surgery (Right, 1984); Back surgery; Spinal cord stimulator implant; Pain pump implantation; Pain pump removal; Tonsillectomy; Foot surgery (Bilateral, 03/18/2020); Foot surgery (08/032021); Upper gi endoscopy; Colonoscopy with propofol  (N/A, 10/03/2020); Esophagogastroduodenoscopy (egd) with propofol  (N/A, 10/03/2020); Cataract extraction w/PHACO (Left, 01/14/2021); Cataract extraction w/PHACO (Right, 01/28/2021); Coronary/Graft Acute MI Revascularization (N/A, 11/27/2021); LEFT HEART CATH AND CORONARY ANGIOGRAPHY (N/A, 11/27/2021); Excision basal cell carcinoma; transthoracic echocardiogram (11/28/2021); Video bronchoscopy with endobronchial ultrasound (N/A, 01/15/2023); Esophagogastroduodenoscopy (egd) with propofol  (N/A, 07/27/2023); biopsy (07/27/2023); Colonoscopy (N/A, 01/26/2024); Coronary angioplasty (1998, 2023); Spine surgery (1999, 2000); Cystoscopy/ureteroscopy/holmium laser/stent placement (Left, 06/26/2024); Colonoscopy (N/A, 07/18/2024); Esophagogastroduodenoscopy (N/A, 07/18/2024); EUS (N/A, 08/31/2024); and Esophagogastroduodenoscopy (N/A, 08/31/2024). Family: family history includes Anxiety disorder in his daughter; Dementia in his mother; Diabetes in his mother; Heart disease in his father; Heart failure in his father; Hypertension in his father; Kidney Stones in his daughter; OCD in his daughter; Stroke in his father and mother; Vision loss in his father.  Laboratory Chemistry Profile   Renal Lab Results  Component Value Date   BUN 14 09/17/2024   CREATININE 0.72 09/17/2024   BCR SEE NOTE: 04/28/2024   GFRAA 101 05/20/2020   GFRNONAA >60 09/17/2024    Hepatic Lab Results  Component Value Date   AST 20 09/17/2024   ALT 15 09/17/2024   ALBUMIN 4.1 09/17/2024   ALKPHOS 95 09/17/2024   AMYLASE 32 09/18/2022   LIPASE 23 09/17/2024    Electrolytes Lab Results  Component Value Date   NA 142 09/17/2024   K  3.9 09/17/2024   CL 106 09/17/2024   CALCIUM  8.9 09/17/2024   MG 2.3 05/30/2024   PHOS 3.6 11/30/2021    Bone Lab Results  Component Value Date   VD25OH 55.7 07/06/2023   TESTOSTERONE  33.2 05/20/2020    Inflammation (CRP: Acute Phase) (ESR: Chronic Phase) Lab Results  Component Value Date   CRP 1 07/06/2023   ESRSEDRATE 2 07/06/2023   LATICACIDVEN 0.8 06/18/2024         Note: Above Lab results reviewed.  Recent Imaging Review  CT Renal Stone Study CLINICAL DATA:  Acute abdominal pain  EXAM: CT ABDOMEN AND PELVIS WITHOUT CONTRAST  TECHNIQUE: Multidetector CT imaging of the abdomen and pelvis was performed following the standard protocol without IV contrast.  RADIATION DOSE REDUCTION: This exam  was performed according to the departmental dose-optimization program which includes automated exposure control, adjustment of the mA and/or kV according to patient size and/or use of iterative reconstruction technique.  COMPARISON:  June 21, 2024  FINDINGS: Lower chest: No acute abnormality.  Hepatobiliary: No focal liver abnormality is seen. Status post cholecystectomy. No biliary dilatation.  Pancreas: Unremarkable. No pancreatic ductal dilatation or surrounding inflammatory changes.  Spleen: Normal in size without focal abnormality.  Adrenals/Urinary Tract: Adrenal glands appear normal. Bilateral nonobstructive nephrolithiasis is noted. No hydronephrosis or renal obstruction is noted. Urinary bladder is unremarkable.  Stomach/Bowel: Small sliding-type hiatal hernia is noted. No evidence of bowel obstruction or inflammation.  Vascular/Lymphatic: Aortic atherosclerosis. No enlarged abdominal or pelvic lymph nodes.  Reproductive: Prostate is unremarkable.  Other: No abdominal wall hernia or abnormality. No abdominopelvic ascites.  Musculoskeletal: No acute or significant osseous findings.  IMPRESSION: 1. Bilateral nonobstructive nephrolithiasis. 2. Small  sliding-type hiatal hernia. 3. No acute abnormality seen in the abdomen or pelvis. 4. Aortic atherosclerosis.  Aortic Atherosclerosis (ICD10-I70.0).  Electronically Signed   By: Lynwood Landy Raddle M.D.   On: 09/17/2024 13:25 DG Chest 2 View EXAM: 2 VIEW(S) XRAY OF THE CHEST 09/17/2024 11:38:00 AM  COMPARISON: Chest CT 03/31/2024, Chest CT 01/12/2023, and Chest X-ray 01/15/2023.  CLINICAL HISTORY: cp  FINDINGS:  LINES, TUBES AND DEVICES: Spinal stimulator leads noted.  LUNGS AND PLEURA: Few linear opacities in right upper lung. Small irregular nodular opacity of the lateral right mid lung without significant change from prior CT as seen on previous chest CT thought to represent a waxing and waning process such as mycobacterium avium complex. Remainder of the lungs are clear without acute process. No pleural effusion. No pneumothorax.  HEART AND MEDIASTINUM: No acute abnormality of the cardiac and mediastinal silhouettes.  BONES AND SOFT TISSUES: Screw fixation of right shoulder noted.  DISCS/DEGENERATIVE CHANGES: Mild degenerative changes of thoracic spine.  IMPRESSION: 1. Stable small irregular nodular opacity in the lateral right mid lung suggesting a waxing and waning infectious or inflammatory process (e.g., Mycobacterium avium complex). Recommend clinical correlation and follow up as clinically indicated. 2. No acute findings.  Electronically signed by: Toribio Agreste MD 09/17/2024 11:57 AM EST RP Workstation: [id number] Note: Reviewed        Physical Exam  Vitals: BP (!) 170/83 (BP Location: Right Arm, Patient Position: Sitting, Cuff Size: Normal)   Pulse (!) 54   Temp 98.1 F (36.7 C) (Temporal)   Resp 18   Ht 5' 9 (1.753 m)   Wt 144 lb (65.3 kg)   SpO2 99%   BMI 21.27 kg/m  BMI: Estimated body mass index is 21.27 kg/m as calculated from the following:   Height as of this encounter: 5' 9 (1.753 m).   Weight as of this encounter: 144 lb (65.3  kg). Ideal: Ideal body weight: 70.7 kg (155 lb 13.8 oz) General appearance: Well nourished, well developed, and well hydrated. In no apparent acute distress Mental status: Alert, oriented x 3 (person, place, & time)       Respiratory: No evidence of acute respiratory distress Eyes: PERLA  Musculoskeletal: Chronic abdominal pain (right lower quadrant)  Assessment   Diagnosis Status  1. Medication management   2. Abdominal pain, chronic, right lower quadrant   3. Right lower quadrant abdominal pain   4. Chronic pain syndrome   5. Chronic right shoulder pain    Controlled Controlled Controlled   Updated Problems: No problems updated.  Plan of Care  Problem-specific:  Assessment and Plan    Chronic abdominal and chest pain syndrome Chronic pain with history of GERD, no acute findings on recent ER visit, managed with hydrocodone . Referred to Dr. Lazarus for further evaluation. - Continue hydrocodone  10-325 mg. - Sent prescription for two months to [employer] [facility] [employer], [employer], [employer]. - Maintain appointment with Dr. Marcelino on January 27th.  Medication management: Patient's pain is controlled with Norco, will continue on current medication regimen.  Prescribing drug monitoring (PDMP) reviewed, findings consistent with the use of prescribed medication and no evidence of narcotic misuse or abuse.  Urine drug screening (UDS) up to date.  No side effects or adverse reaction reported to medication.  Schedule follow-up in 60 days for medication management.     Mr. ALDWIN MICALIZZI has a current medication list which includes the following long-term medication(s): dicyclomine , escitalopram , ezetimibe -simvastatin , hydralazine , ipratropium, linaclotide , propranolol , rabeprazole , testosterone  cypionate, and trazodone .  Pharmacotherapy (Medications Ordered): Meds ordered this encounter  Medications   HYDROcodone -acetaminophen  (NORCO) 10-325 MG tablet    Sig: Take 1 tablet by mouth  every 12 (twelve) hours as needed.    Dispense:  60 tablet    Refill:  0   HYDROcodone -acetaminophen  (NORCO) 10-325 MG tablet    Sig: Take 1 tablet by mouth every 12 (twelve) hours as needed.    Dispense:  60 tablet    Refill:  0   Orders:  No orders of the defined types were placed in this encounter.      Return in about 2 months (around 11/18/2024) for (F2F), (MM), Emmy Blanch NP.    Recent Visits Date Type Provider Dept  08/23/24 Office Visit Angline Schweigert K, NP [facility]  07/26/24 Office Visit Aspynn Clover K, NP [facility]  Showing recent visits within past 90 days and meeting all other requirements Today's Visits Date Type Provider Dept  09/20/24 Office Visit Kire Ferg K, NP [facility]  Showing today's visits and meeting all other requirements Future Appointments Date Type Provider Dept  10/10/24 Appointment Marcelino Nurse, MD [facility]  11/14/24 Appointment Chou Busler K, NP [facility]  Showing future appointments within next 90 days and meeting all other requirements  I discussed the assessment and treatment plan with the patient. The patient was provided an opportunity to ask questions and all were answered. The patient agreed with the plan and demonstrated an understanding of the instructions.  Patient advised to call back or seek an in-person evaluation if the symptoms or condition worsens.  I personally spent a total of 30 minutes in the care of the patient today including preparing to see the patient, getting/reviewing separately obtained history, performing a medically appropriate exam/evaluation, counseling and educating, placing orders, referring and communicating with other health care professionals, documenting clinical information in the EHR, independently interpreting results, communicating results, and coordinating care.   Note by: Odas Ozer K Jezebel Pollet, NP (TTS and AI technology used. I apologize for any  typographical errors that were not detected and corrected.) Date: 09/20/2024; Time: 12:09 PM

## 2024-09-20 ENCOUNTER — Ambulatory Visit: Attending: Nurse Practitioner | Admitting: Nurse Practitioner

## 2024-09-20 ENCOUNTER — Encounter: Payer: Self-pay | Admitting: Nurse Practitioner

## 2024-09-20 VITALS — BP 170/83 | HR 54 | Temp 98.1°F | Resp 18 | Ht 69.0 in | Wt 144.0 lb

## 2024-09-20 DIAGNOSIS — G8929 Other chronic pain: Secondary | ICD-10-CM | POA: Insufficient documentation

## 2024-09-20 DIAGNOSIS — R1031 Right lower quadrant pain: Secondary | ICD-10-CM | POA: Diagnosis present

## 2024-09-20 DIAGNOSIS — Z79899 Other long term (current) drug therapy: Secondary | ICD-10-CM | POA: Diagnosis present

## 2024-09-20 DIAGNOSIS — G894 Chronic pain syndrome: Secondary | ICD-10-CM | POA: Insufficient documentation

## 2024-09-20 DIAGNOSIS — M25511 Pain in right shoulder: Secondary | ICD-10-CM | POA: Insufficient documentation

## 2024-09-20 MED ORDER — HYDROCODONE-ACETAMINOPHEN 10-325 MG PO TABS: 1.0000 | ORAL_TABLET | Freq: Two times a day (BID) | ORAL | 0 refills | Status: DC | PRN

## 2024-09-20 NOTE — Progress Notes (Signed)
 Nursing Pain Medication Assessment:  Safety precautions to be maintained throughout the outpatient stay will include: orient to surroundings, keep bed in low position, maintain call bell within reach at all times, provide assistance with transfer out of bed and ambulation.  Medication Inspection Compliance: Pill count conducted under aseptic conditions, in front of the patient. Neither the pills nor the bottle was removed from the patient's sight at any time. Once count was completed pills were immediately returned to the patient in their original bottle.  Medication: Hydrocodone /APAP Pill/Patch Count: 9 of 60 pills/patches remain Pill/Patch Appearance: Markings consistent with prescribed medication Bottle Appearance: Standard pharmacy container. Clearly labeled. Filled Date: 10 / 10 / 2025 Last Medication intake:  Today

## 2024-09-21 ENCOUNTER — Ambulatory Visit: Payer: Self-pay | Admitting: Urology

## 2024-09-28 ENCOUNTER — Encounter: Payer: Self-pay | Admitting: Student in an Organized Health Care Education/Training Program

## 2024-09-28 ENCOUNTER — Encounter: Payer: Self-pay | Admitting: Internal Medicine

## 2024-09-28 ENCOUNTER — Ambulatory Visit
Attending: Student in an Organized Health Care Education/Training Program | Admitting: Student in an Organized Health Care Education/Training Program

## 2024-09-28 ENCOUNTER — Inpatient Hospital Stay: Admitting: Internal Medicine

## 2024-09-28 ENCOUNTER — Ambulatory Visit (INDEPENDENT_AMBULATORY_CARE_PROVIDER_SITE_OTHER): Admitting: Internal Medicine

## 2024-09-28 VITALS — BP 150/80 | Ht 69.0 in | Wt 146.6 lb

## 2024-09-28 VITALS — BP 154/72 | HR 59 | Temp 97.9°F | Resp 16 | Ht 69.0 in | Wt 144.0 lb

## 2024-09-28 DIAGNOSIS — K581 Irritable bowel syndrome with constipation: Secondary | ICD-10-CM | POA: Diagnosis not present

## 2024-09-28 DIAGNOSIS — G894 Chronic pain syndrome: Secondary | ICD-10-CM | POA: Diagnosis present

## 2024-09-28 DIAGNOSIS — M25522 Pain in left elbow: Secondary | ICD-10-CM

## 2024-09-28 DIAGNOSIS — R1031 Right lower quadrant pain: Secondary | ICD-10-CM | POA: Diagnosis present

## 2024-09-28 DIAGNOSIS — K589 Irritable bowel syndrome without diarrhea: Secondary | ICD-10-CM | POA: Insufficient documentation

## 2024-09-28 DIAGNOSIS — N390 Urinary tract infection, site not specified: Secondary | ICD-10-CM | POA: Diagnosis not present

## 2024-09-28 DIAGNOSIS — J3089 Other allergic rhinitis: Secondary | ICD-10-CM

## 2024-09-28 DIAGNOSIS — R109 Unspecified abdominal pain: Secondary | ICD-10-CM | POA: Diagnosis not present

## 2024-09-28 DIAGNOSIS — M25521 Pain in right elbow: Secondary | ICD-10-CM

## 2024-09-28 DIAGNOSIS — M6281 Muscle weakness (generalized): Secondary | ICD-10-CM

## 2024-09-28 DIAGNOSIS — Z0289 Encounter for other administrative examinations: Secondary | ICD-10-CM | POA: Diagnosis present

## 2024-09-28 DIAGNOSIS — G629 Polyneuropathy, unspecified: Secondary | ICD-10-CM

## 2024-09-28 DIAGNOSIS — M25511 Pain in right shoulder: Secondary | ICD-10-CM | POA: Diagnosis present

## 2024-09-28 DIAGNOSIS — Z79899 Other long term (current) drug therapy: Secondary | ICD-10-CM | POA: Diagnosis present

## 2024-09-28 DIAGNOSIS — N200 Calculus of kidney: Secondary | ICD-10-CM | POA: Diagnosis not present

## 2024-09-28 DIAGNOSIS — G8929 Other chronic pain: Secondary | ICD-10-CM | POA: Diagnosis present

## 2024-09-28 MED ORDER — TRIAMCINOLONE ACETONIDE 55 MCG/ACT NA AERO: 1.0000 | INHALATION_SPRAY | Freq: Every day | NASAL | 0 refills | Status: AC

## 2024-09-28 MED ORDER — DICYCLOMINE HCL 20 MG PO TABS: 20.0000 mg | ORAL_TABLET | Freq: Three times a day (TID) | ORAL | 1 refills | Status: DC

## 2024-09-28 NOTE — Progress Notes (Signed)
 PROVIDER NOTE: Interpretation of information contained herein should be left to medically-trained personnel. Specific patient instructions are provided elsewhere under Patient Instructions section of medical record. This document was created in part using AI and STT-dictation technology, any transcriptional errors that may result from this process are unintentional.  Patient: Grant Young  Service: E/M   PCP: Antonette Angeline LELON, NP  DOB: 04-28-1946  DOS: 09/28/2024  Provider: Wallie Sherry, MD  MRN: [id number]  Delivery: Face-to-face  Specialty: Interventional Pain Management  Type: Established Patient  Setting: Ambulatory outpatient facility  Specialty designation: 09  Referring Prov.: Antonette Angeline LELON, NP  Location: Outpatient office facility       History of present illness (HPI) Grant Young, a 79 y.o. year old male, is here today because of his Right lower quadrant abdominal pain [R10.31]. Grant Young primary complain today is Abdominal Pain (Chest/abdomen midline )  Pertinent problems: Grant Young has Lumbar spondylosis and Chronic pain syndrome on their pertinent problem list.  Pain Assessment: Severity of Chronic pain is reported as a 7 /10. Location: Abdomen Mid, Upper/up and down from chest to abdomen. Onset: More than a month ago. Quality: Aching, Burning, Sharp, Penetrating, Other (Comment) (bad taste in his mouth). Timing: Constant. Modifying factor(s): medication. Vitals:  height is 5' 9 (1.753 m) and weight is 144 lb (65.3 kg). His temporal temperature is 97.9 F (36.6 C). His blood pressure is 154/72 (abnormal) and his pulse is 59 (abnormal). His respiration is 16 and oxygen saturation is 98%.  BMI: Estimated body mass index is 21.27 kg/m as calculated from the following:   Height as of this encounter: 5' 9 (1.753 m).   Weight as of this encounter: 144 lb (65.3 kg).  Last encounter: 06/06/2024. Last procedure: 11/17/2023.  Reason for encounter:   History of Present Illness    Grant Young is a 79 year old male who presents with persistent abdominal and chest pain.   He experiences persistent abdominal and chest pain, described as sharp, caustic, and penetrating, occurring daily, primarily in the morning around 7 to 8 AM. The pain improves after taking medications, including Norco, and using a hot rice pack. The pain can last several hours, sometimes improving after lunch, and food seems to help alleviate it.  He has been taking Bentyl  at a dose of 10 mg four times a day, which provides some relief. He previously tried hyoscyamine but prefers Bentyl  as it is less likely to cause headaches. He takes two 10 mg doses in the morning and afternoon but feels this is insufficient.  He experiences nausea and a distaste in his mouth but does not vomit. Lying down worsens the pain, prompting him to get up and take medication, which helps reduce the pain to a more manageable level during the day.  Previous GI workup, including a EGD, colonoscopy, and ultrasound, has not identified an organic cause for the pain. He expresses frustration with the ongoing nature of his symptoms despite the negative diagnostic findings.       Pharmacotherapy Assessment   Hydrocodone  IR 10 mg BID-TID prn   Monitoring: [place] PMP: PDMP reviewed during this encounter.       Pharmacotherapy: No side-effects or adverse reactions reported. Compliance: No problems identified. Effectiveness: Clinically acceptable.  Grant Chrissie MATSU, RN  09/28/2024  9:40 AM  Sign when Signing Visit Safety precautions to be maintained throughout the outpatient stay will include: orient to surroundings, keep bed in low position, maintain call bell within  reach at all times, provide assistance with transfer out of bed and ambulation.   UDS:  Summary  Date Value Ref Range Status  09/17/2020 Note  Final    Comment:    ==================================================================== Compliance Drug Analysis,  Ur ==================================================================== Specimen Alert Note: Urinary creatinine is low; ability to detect some drugs may be compromised. Interpret results with caution. (Creatinine) ==================================================================== Test                             Result       Flag       Units  Drug Present and Declared for Prescription Verification   Lorazepam                       1107         EXPECTED   ng/mg creat    Source of lorazepam  is a scheduled prescription medication.    Butalbital                      PRESENT      EXPECTED   Citalopram                     PRESENT      EXPECTED   Desmethylcitalopram            PRESENT      EXPECTED    Desmethylcitalopram is an expected metabolite of citalopram or the    enantiomeric form, escitalopram .    Acetaminophen                   PRESENT      EXPECTED   Propranolol                     PRESENT      EXPECTED  Drug Present not Declared for Prescription Verification   Oxazepam                       300          UNEXPECTED ng/mg creat   Temazepam                      357          UNEXPECTED ng/mg creat    Oxazepam and temazepam are expected metabolites of diazepam .    Oxazepam is also an expected metabolite of other benzodiazepine    drugs, including chlordiazepoxide, prazepam, clorazepate, halazepam,    and temazepam.  Oxazepam and temazepam are available as scheduled    prescription medications.  Drug Absent but Declared for Prescription Verification   Hydrocodone                     Not Detected UNEXPECTED ng/mg creat   Tizanidine                      Not Detected UNEXPECTED    Tizanidine , as indicated in the declared medication list, is not    always detected even when used as directed.    Amitriptyline                   Not Detected UNEXPECTED   Prochlorperazine                Not Detected UNEXPECTED   Salicylate  Not Detected UNEXPECTED    Aspirin , as indicated in  the declared medication list, is not always    detected even when used as directed.  ==================================================================== Test                      Result    Flag   Units      Ref Range   Creatinine              14        LL     mg/dL      >=79 ==================================================================== Declared Medications:  The flagging and interpretation on this report are based on the  following declared medications.  Unexpected results may arise from  inaccuracies in the declared medications.   **Note: The testing scope of this panel includes these medications:   Amitriptyline  (Elavil )  Butalbital  (Fioricet )  Butalbital  (Fiorinal )  Escitalopram  (Lexapro )  Hydrocodone  (Norco)  Lorazepam  (Ativan )  Prochlorperazine  (Compazine )  Propranolol  (Inderal )   **Note: The testing scope of this panel does not include small to  moderate amounts of these reported medications:   Acetaminophen  (Fioricet )  Acetaminophen  (Norco)  Aspirin  (Fiorinal )  Tizanidine  (Zanaflex )   **Note: The testing scope of this panel does not include the  following reported medications:   Anastrozole  (Arimidex )  Caffeine  (Fioricet )  Caffeine  (Fiorinal )  Chlorthalidone  (Hygroton )  Cholecalciferol   Cholestyramine  (Questran )  Dicyclomine  (Bentyl )  Ezetimibe  (Zetia )  Hydrocortisone   Melatonin  Ondansetron  (Zofran )  Potassium (Klor-Con )  Rabeprazole  (Aciphex )  Sucralfate  (Carafate )  Valacyclovir  (Valtrex )  Vitamin B12 ==================================================================== For clinical consultation, please call [phone]. ====================================================================     No results found for: CBDTHCR No results found for: D8THCCBX No results found for: D9THCCBX  ROS  Constitutional: Denies any fever or chills Gastrointestinal: + for abdominal pain Musculoskeletal: Denies any acute onset joint swelling,  redness, loss of ROM, or weakness Neurological: No reported episodes of acute onset apraxia, aphasia, dysarthria, agnosia, amnesia, paralysis, loss of coordination, or loss of consciousness  Medication Review  Cholecalciferol , DHEA, HYDROcodone -acetaminophen , Magnesium  Bisglycinate, NONFORMULARY OR COMPOUNDED ITEM, Nutritional Supplements, Perfluorohexyloctane, Probiotic Product, RABEprazole , UNABLE TO FIND, Vibegron , Vitamin B-12, anastrozole , benzonatate , cholestyramine , dicyclomine , escitalopram , ezetimibe -simvastatin , hydrALAZINE , ipratropium, ketoconazole, linaclotide , loperamide , meclizine , melatonin, ondansetron , propranolol , sodium fluoride, testosterone  cypionate, traZODone , triamcinolone  cream, and valACYclovir   History Review  Allergy: Grant Young is allergic to ace inhibitors, fluoxetine, metoclopramide, nalbuphine, other, amoxicillin-pot clavulanate, doxazosin, duloxetine, penicillins, tamsulosin, trazodone  and nefazodone, alirocumab , amlodipine, cinoxacin, ciprofloxacin, fludrocortisone , nebivolol, olanzapine, olmesartan, pregabalin, prostaglandins, thyroid  hormones, venlafaxine , venlafaxine  hcl er, zolpidem, duloxetine hcl, fluoxetine hcl, nucynta  [tapentadol ], and phenytoin. Drug: Grant Young  reports that he does not currently use drugs. Alcohol:  reports current alcohol use. Tobacco:  reports that he has never smoked. He has never been exposed to tobacco smoke. He has never used smokeless tobacco. Social: Grant Young  reports that he has never smoked. He has never been exposed to tobacco smoke. He has never used smokeless tobacco. He reports current alcohol use. He reports that he does not currently use drugs. Medical:  has a past medical history of Allergy, Anemia, Aneurysm of ascending aorta, Anxiety, Aortic atherosclerosis, Cataract (2015), Chronic back pain, Chronic kidney disease (1980s), Clotting disorder (01-2022), Coronary artery disease, Depression, Erectile dysfunction, GERD  (gastroesophageal reflux disease), h/o Lyme disease, Headache, Hiatal hernia, History of bilateral cataract extraction (01/2021), History of gastritis, History of kidney stones, History of neuropathy, Hyperlipidemia LDL goal <70, Hypertension, Insomnia, Left lower lobe pulmonary nodule, Long term current use of antithrombotics/antiplatelets, Long  term current use of aromatase inhibitor, Lumbar radicular pain, Myocardial infarction [facility]), NSTEMI (non-ST elevated myocardial infarction) (HCC) (11/27/2021), Overactive bladder, Pneumonia, PONV (postoperative nausea and vomiting), Recurrent herpes labialis, Skin cancer, basal cell, Spinal cord stimulator status, Squamous cell skin cancer, Substance abuse (HCC), and Syncope and collapse (2022). Surgical: Grant Young  has a past surgical history that includes Kidney stone surgery (09/14/1980); Kidney stone surgery (09/15/1995); Lithotripsy (09/14/2014); Lithotripsy (09/14/1996); Lithotripsy (09/14/1997); Gallbladder surgery (09/15/2015); Sigmoidoscopy (09/14/2017); Colonoscopy (09/15/2015); Cholecystectomy (2017); Shoulder surgery (Right, 1984); Back surgery; Spinal cord stimulator implant; Pain pump implantation; Pain pump removal; Tonsillectomy; Foot surgery (Bilateral, 03/18/2020); Foot surgery (08/032021); Upper gi endoscopy; Colonoscopy with propofol  (N/A, 10/03/2020); Esophagogastroduodenoscopy (egd) with propofol  (N/A, 10/03/2020); Cataract extraction w/PHACO (Left, 01/14/2021); Cataract extraction w/PHACO (Right, 01/28/2021); Coronary/Graft Acute MI Revascularization (N/A, 11/27/2021); LEFT HEART CATH AND CORONARY ANGIOGRAPHY (N/A, 11/27/2021); Excision basal cell carcinoma; transthoracic echocardiogram (11/28/2021); Video bronchoscopy with endobronchial ultrasound (N/A, 01/15/2023); Esophagogastroduodenoscopy (egd) with propofol  (N/A, 07/27/2023); biopsy (07/27/2023); Colonoscopy (N/A, 01/26/2024); Coronary angioplasty (1998, 2023); Spine surgery (1999, 2000);  Cystoscopy/ureteroscopy/holmium laser/stent placement (Left, 06/26/2024); Colonoscopy (N/A, 07/18/2024); Esophagogastroduodenoscopy (N/A, 07/18/2024); EUS (N/A, 08/31/2024); and Esophagogastroduodenoscopy (N/A, 08/31/2024). Family: family history includes Anxiety disorder in his daughter; Dementia in his mother; Diabetes in his mother; Heart disease in his father; Heart failure in his father; Hypertension in his father; Kidney Stones in his daughter; OCD in his daughter; Stroke in his father and mother; Vision loss in his father.  Laboratory Chemistry Profile   Renal Lab Results  Component Value Date   BUN 14 09/17/2024   CREATININE 0.72 09/17/2024   BCR SEE NOTE: 04/28/2024   GFRAA 101 05/20/2020   GFRNONAA >60 09/17/2024    Hepatic Lab Results  Component Value Date   AST 20 09/17/2024   ALT 15 09/17/2024   ALBUMIN 4.1 09/17/2024   ALKPHOS 95 09/17/2024   AMYLASE 32 09/18/2022   LIPASE 23 09/17/2024    Electrolytes Lab Results  Component Value Date   NA 142 09/17/2024   K 3.9 09/17/2024   CL 106 09/17/2024   CALCIUM  8.9 09/17/2024   MG 2.3 05/30/2024   PHOS 3.6 11/30/2021    Bone Lab Results  Component Value Date   VD25OH 55.7 07/06/2023   TESTOSTERONE  33.2 05/20/2020    Inflammation (CRP: Acute Phase) (ESR: Chronic Phase) Lab Results  Component Value Date   CRP 1 07/06/2023   ESRSEDRATE 2 07/06/2023   LATICACIDVEN 0.8 06/18/2024         Note: Above Lab results reviewed.  Recent Imaging Review  US  RENAL EXAM: RETROPERITONEAL ULTRASOUND OF THE KIDNEYS 09/05/2024 11:09:38 AM  TECHNIQUE: Real-time ultrasonography of the retroperitoneum, specifically the kidneys and urinary bladder, was performed.  COMPARISON: US  Renal Artery Stenosis 04/09/2023 and 06/21/2024.  CLINICAL HISTORY: Recurrent urinary tract infection.  FINDINGS:  RIGHT KIDNEY: Right kidney measures 12.0 x 5.6 x 5.6 cm. Mildly increased cortical echogenicity. No hydronephrosis. Lower pole  calculus measuring 6 mm. Interpolar region cyst measuring 1.3 x 1.4 x 1.8 cm. Lower pole cyst measuring 1 x 1 x 1 cm. Right ureteral jet is not visualized. No mass.  LEFT KIDNEY: Left kidney measures 12.9 x 4.8 x 5.4 cm. Mildly increased cortical echogenicity. No hydronephrosis. The renal calculi noted on the prior CT were not well visualized on today's ultrasound. No mass. Left ureteral jet is not visualized.  BLADDER: Unremarkable appearance of the bladder.  IMPRESSION: 1. Right lower pole renal calculus measuring 6 mm. The left renal calculi on the prior  CT were not well visualized on today's ultrasound. No hydronephrosis. 2. Mildly increased echogenicity of both kidneys, suggesting underlying chronic medical renal disease. 3. Abnormal postvoid residual of 188 ml .  Electronically signed by: Rogelia Myers MD MD 09/21/2024 10:59 AM EST RP Workstation: [id number] Note: Reviewed        Physical Exam  Vitals: BP (!) 154/72 (BP Location: Right Arm, Patient Position: Sitting, Cuff Size: Normal)   Pulse (!) 59   Temp 97.9 F (36.6 C) (Temporal)   Resp 16   Ht 5' 9 (1.753 m)   Wt 144 lb (65.3 kg)   SpO2 98%   BMI 21.27 kg/m  BMI: Estimated body mass index is 21.27 kg/m as calculated from the following:   Height as of this encounter: 5' 9 (1.753 m).   Weight as of this encounter: 144 lb (65.3 kg). Ideal: Ideal body weight: 70.7 kg (155 lb 13.8 oz) General appearance: Well nourished, well developed, and well hydrated. In no apparent acute distress Mental status: Alert, oriented x 3 (person, place, & time)       Respiratory: No evidence of acute respiratory distress Eyes: PERLA  Midline and right lower quadrant abdominal pain  Assessment   Diagnosis  1. Right lower quadrant abdominal pain   2. Abdominal pain, chronic, right lower quadrant   3. Chronic right shoulder pain   4. Medication management   5. Pain management contract signed   6. Chronic pain syndrome   7.  Irritable bowel syndrome without diarrhea       Plan of Care  Problem-specific:  Assessment and Plan    Chronic functional abdominal pain associated with irritable bowel syndrome   He experiences chronic functional abdominal pain, likely related to irritable bowel syndrome, characterized by sharp, caustic, and penetrating pain primarily in the morning. Pain improves with food intake and Bentyl , though the current dose is subtherapeutic. Increase Bentyl  to 20 mg four times a day, with potential escalation to 30-40 mg four times a day if needed. Scheduled trigger point injections for January 26th. Discontinue hyoscyamine and focus on Bentyl  for symptom management.  Chronic pain syndrome   His chronic pain syndrome is managed with Norco and heat application. Pain is severe in the morning but improves with medication and heat. Current management includes Norco and Bentyl , with a focus on optimizing Bentyl  dosage for better symptom control. Continue Norco as needed for pain management and continue using heat application for symptomatic relief.       Grant Young has a current medication list which includes the following long-term medication(s): dicyclomine , escitalopram , ezetimibe -simvastatin , hydralazine , ipratropium, linaclotide , propranolol , rabeprazole , testosterone  cypionate, and trazodone .  Pharmacotherapy (Medications Ordered): Meds ordered this encounter  Medications   dicyclomine  (BENTYL ) 20 MG tablet    Sig: Take 1 tablet (20 mg total) by mouth 4 (four) times daily -  before meals and at bedtime.    Dispense:  120 tablet    Refill:  1   Orders:  Orders Placed This Encounter  Procedures   TRIGGER POINT INJECTION    Standing Status:   Future    Expected Date:   10/16/2024    Expiration Date:   09/28/2025    Scheduling Instructions:     Abdominal TPI    Where will this procedure be performed?:   [facility]     Status post Botox  No. 1 on 01/01/2020 for migraine,  lumbar TPI as well. SPG block 03/13/20. Bilateral GONB 04/01/20. 11/13/20 B/L L3,4,5  Facet blocks helpful repeat prn                Return in about 11 days (around 10/09/2024) for Abdominal TPI with US .    Recent Visits Date Type Provider Dept  09/20/24 Office Visit Patel, Seema K, NP [facility]  08/23/24 Office Visit Patel, Seema K, NP [facility]  07/26/24 Office Visit Patel, Seema K, NP [facility]  Showing recent visits within past 90 days and meeting all other requirements Today's Visits Date Type Provider Dept  09/28/24 Office Visit Marcelino Nurse, MD [facility]  Showing today's visits and meeting all other requirements Future Appointments Date Type Provider Dept  10/09/24 Appointment Marcelino Nurse, MD [facility]  11/14/24 Appointment Patel, Seema K, NP [facility]  Showing future appointments within next 90 days and meeting all other requirements  I discussed the assessment and treatment plan with the patient. The patient was provided an opportunity to ask questions and all were answered. The patient agreed with the plan and demonstrated an understanding of the instructions.  Patient advised to call back or seek an in-person evaluation if the symptoms or condition worsens.  I personally spent a total of 30 minutes in the care of the patient today including preparing to see the patient, getting/reviewing separately obtained history, performing a medically appropriate exam/evaluation, counseling and educating, placing orders, and documenting clinical information in the EHR.   Note by: Nurse Marcelino, MD (TTS and AI technology used. I apologize for any typographical errors that were not detected and corrected.) Date: 09/28/2024; Time: 11:52 AM

## 2024-09-28 NOTE — Progress Notes (Signed)
 Safety precautions to be maintained throughout the outpatient stay will include: orient to surroundings, keep bed in low position, maintain call bell within reach at all times, provide assistance with transfer out of bed and ambulation.

## 2024-09-28 NOTE — Progress Notes (Signed)
 "  Subjective:    Patient ID: Grant Young, male    DOB: 29-Dec-1945, 79 y.o.   MRN: [id number]  HPI  Discussed the use of AI scribe software for clinical note transcription with the patient, who gave verbal consent to proceed.  Grant Young is a 79 year old male with chronic abdominal pain and recurrent urinary tract infections who presents for follow-up after multiple urgent care, ER, and specialty visits.  He has been experiencing chronic abdominal pain, leading to multiple ER visits and specialist consultations. Initially, he presented to the ER on September 16th with epigastric pain, headache, and dizziness. A CT of the abdomen and pelvis showed possible enteritis, bilateral kidney stones, and diverticulosis without evidence of diverticulitis. He was treated with hydrocodone  and meclizine  and continues to use Aciphex , Carafate , and Bentyl . Subsequent visits to pain management and GI specialists resulted in recommendations for an upper GI and colonoscopy, and trials of Ibergast and Linzess . The colonoscopy on November 4th showed diverticula and non-bleeding internal hemorrhoids, while the upper endoscopy revealed a hiatal hernia, gastritis, and external compression of the gastric body. H. Pylori testing was negative. An endoscopic ultrasound on December 18th showed a Schatzki's ring and external compression of the incisura.  He has been dealing with recurrent urinary tract infections. On October 5th, he returned to the ER with left flank pain, where a CT renal stone study showed mild obstructive uropathy and a 3 mm calculus on the left. He was treated with IV fluids and Toradol . Subsequent visits to urgent care on October 24th and December 9th for urinary symptoms led to treatments with Macrobid , Bactrim , and Keflex .  Recurrent urines culture grew Enterobacter. A renal ultrasound on December 23rd showed a 6 mm right renal calculus and increased echogenicity of the bilateral kidneys.  He has been  experiencing upper respiratory symptoms, including sinus drainage and cough, which have worsened over the past few days. He reports frequent nose blowing and a distasteful taste, with postnasal drip suspected to be causing the cough. He has been using Zyrtec and Atrovent  nasal spray intermittently.  He has a history of peripheral neuropathy and is seeking custom orthotic shoe inserts. He also experiences bilateral elbow pain and generalized weakness, for which he request a referral to physical therapy.       Review of Systems     Past Medical History:  Diagnosis Date   Allergy    Anemia    Aneurysm of ascending aorta    a.) CT chest 08/18/2022: 4.1 cm; b.) CT chest 12/07/2022: 4.2 cm   Anxiety    a.) on BZO (diazepam ) PRN   Aortic atherosclerosis    Cataract 2015   Chronic back pain    Chronic kidney disease 1980s   Kidney Stones   Clotting disorder 01-2022   Plavix    Coronary artery disease    a.) s/p PCI with DES x 2 (pLCx and o-mLAD) 11/27/2021   Depression    Erectile dysfunction    a.) Bi-mix (papaverine + phentolamine) injections + exogenous testosterone  injections   GERD (gastroesophageal reflux disease)    h/o Lyme disease    Headache    Hiatal hernia    History of bilateral cataract extraction 01/2021   History of gastritis    History of kidney stones    History of neuropathy    Hyperlipidemia LDL goal <70    a.) CAD with NSTEMI -->  intolerant of statins with statin myopathy and memory issues.;  Also intolerant  of Repatha    Hypertension    Labile blood pressures but dizziness with blood pressures in the normal range; intolerant of most medications including ARB's, ACE inhibitor's, amlodipine most beta-blockers other than propranolol .   Insomnia    a.) takes malatonin PRN   Left lower lobe pulmonary nodule    a.) chest CT 12/07/2022: nodules x 2 posteromedial LLL;  8 x 11 mm and 8 x 10 mm   Long term current use of antithrombotics/antiplatelets    a.) DAPT  (ASA + clopidogrel )   Long term current use of aromatase inhibitor    a,) anastrozole  --> estridiol suppression secondary to exogenous testosterone  use   Lumbar radicular pain    Myocardial infarction (HCC)    a.) MI x 2 - 1990 * 1999 - PTCA   NSTEMI (non-ST elevated myocardial infarction) (HCC) 11/27/2021   a.) LHC 11/28/2021: sequential 75% o-pLAD, 70% OM2, 80% p-mLAD, 99% mLAD, 100% pLCx --> PCI placing a 2.5 x 26 mm Onyx Frontier DES to pLCx and a 2.5 x 24 mm Onyx Frontier DES to the o-m LAD (covering 3 lesions)   Overactive bladder    Pneumonia    PONV (postoperative nausea and vomiting)    Recurrent herpes labialis    a.) has suppressive valacyclovir  to use PRN   Skin cancer, basal cell    Spinal cord stimulator status    01/08/21 - not currently using.   Squamous cell skin cancer    Substance abuse (HCC)    Syncope and collapse 2022    Current Outpatient Medications  Medication Sig Dispense Refill   anastrozole  (ARIMIDEX ) 1 MG tablet Take 0.5 mg by mouth once a week.     benzonatate  (TESSALON ) 100 MG capsule Take 1 capsule (100 mg total) by mouth every 8 (eight) hours. 21 capsule 0   Cholecalciferol  50 MCG (2000 UT) CAPS Take 2,000 Units by mouth daily.     cholestyramine  (QUESTRAN ) 4 g packet Take 4 g by mouth daily as needed (diarrhea).     Cyanocobalamin  (VITAMIN B-12) 5000 MCG LOZG Take 1 lozenge by mouth daily.      DHEA 25 MG CAPS Take 25 mg by mouth daily.     dicyclomine  (BENTYL ) 20 MG tablet Take 1 tablet (20 mg total) by mouth 4 (four) times daily -  before meals and at bedtime. 120 tablet 1   escitalopram  (LEXAPRO ) 20 MG tablet TAKE 1 TABLET(20 MG) BY MOUTH DAILY 90 tablet 1   ezetimibe -simvastatin  (VYTORIN ) 10-20 MG tablet Take 1 tablet by mouth daily. 90 tablet 3   hydrALAZINE  (APRESOLINE ) 25 MG tablet Take 2 tabs p.o. in a.m. and q. afternoon, 1 tab p.o. in the evening 450 tablet 1   HYDROcodone -acetaminophen  (NORCO) 10-325 MG tablet Take 1 tablet by mouth every  12 (twelve) hours as needed. 60 tablet 0   [START ON 10/24/2024] HYDROcodone -acetaminophen  (NORCO) 10-325 MG tablet Take 1 tablet by mouth every 12 (twelve) hours as needed. 60 tablet 0   ipratropium (ATROVENT ) 0.03 % nasal spray Place 2 sprays into both nostrils every 12 (twelve) hours. (Patient taking differently: Place 2 sprays into both nostrils 2 (two) times daily as needed (Allergies).) 30 mL 12   ketoconazole (NIZORAL) 2 % shampoo Apply 1 Application topically daily. With shower     linaclotide  (LINZESS ) 72 MCG capsule Take 1 capsule (72 mcg total) by mouth daily before breakfast. 90 capsule 1   loperamide  (IMODIUM  A-D) 2 MG tablet Take 2 mg by mouth 4 (four) times  daily as needed for diarrhea or loose stools.     MAGNESIUM  BISGLYCINATE PO Take 1 tablet by mouth daily.     meclizine  (ANTIVERT ) 25 MG tablet Take 1 tablet (25 mg total) by mouth 3 (three) times daily as needed for dizziness. 30 tablet 0   melatonin 5 MG TABS Take 5 mg by mouth at bedtime.     MIEBO 1.338 GM/ML SOLN Place 1 drop into both eyes daily as needed (Dry eyes).     NONFORMULARY OR COMPOUNDED ITEM Bi-Mix Papaverine 30mg , Phentolamine 1mg    Dosage: Inject 0.25cc-0.5cc as need for ED   Vial 1ml   Qty #5 Refills 6   [facility] [phone] Fax [phone] (Patient taking differently: as needed. Bi-Mix Papaverine 30mg , Phentolamine 1mg   Dosage: Inject 0.25cc-0.5cc as need for ED Vial 1ml   Qty #5 Refills 6   [facility] [phone] Fax [phone]) 5 each 6   Nutritional Supplements (NUTRITIONAL SUPPLEMENT PO) Take 1 capsule by mouth daily. [name]     ondansetron  (ZOFRAN -ODT) 4 MG disintegrating tablet Take 1 tablet (4 mg total) by mouth every 8 (eight) hours as needed for nausea or vomiting. 20 tablet 0   PREVIDENT 5000 DRY MOUTH 1.1 % GEL dental gel Place 1 Application onto teeth 3 (three) times daily.     Probiotic Product (PROBIOTIC DAILY PO) Take 1 capsule by  mouth daily.     propranolol  (INDERAL ) 40 MG tablet Take 1 tablet (40 mg total) by mouth 2 (two) times daily. 180 tablet 0   RABEprazole  (ACIPHEX ) 20 MG tablet Take 1 tablet (20 mg total) by mouth daily. 360 tablet 3   testosterone  cypionate (DEPOTESTOSTERONE CYPIONATE) 200 MG/ML injection Inject 80 mg into the muscle every 14 (fourteen) days.     traZODone  (DESYREL ) 100 MG tablet Take 1 tablet (100 mg total) by mouth at bedtime. 90 tablet 0   triamcinolone  cream (KENALOG ) 0.1 % as needed (skin irritation, insect bites).     UNABLE TO FIND SERRAPEPTASE 1000mg  Po Daily     valACYclovir  (VALTREX ) 1000 MG tablet Take 2 tabs p.o. and repeat in 12 hours as needed for cold sore 30 tablet 0   Vibegron  (GEMTESA ) 75 MG TABS Take 1 tablet (75 mg total) by mouth daily. 30 tablet 11   No current facility-administered medications for this visit.    Allergies  Allergen Reactions   Ace Inhibitors     Other reaction(s): Cough   Fluoxetine Anxiety    made me fall asleep per pt bad headaches and makes  Me  Crazy historical allergy noted in [employer]   Metoclopramide     Other reaction(s): Other (See Comments), Other (See Comments), Unknown Tardive Dyskinesia  historical allergy noted in [employer]   Nalbuphine     Used Post Back surgery- Anesthesiologist Error. Patient had Narcotic Withdraw. Per New Patient Packet.    Other     Other reaction(s): Other (See Comments) Altered mental status in combo with narcotics at previous hospitalization - Full Withdrawal Symptoms Other reaction(s): Rash   Amoxicillin-Pot Clavulanate Nausea Only    Per New Patient Packet.   Doxazosin Rash    Other reaction(s): Other - See Comments, Rash     Duloxetine Nausea Only    Per New Patient Packet.    Penicillins Nausea Only    Per New Patient Packet.   Tamsulosin Itching and Anxiety    Restless, Flushing, Heavy Chest, Itching, Hyperactive mood and Anxiety. Unable to handle side effects.  Per New Patient Packet.      Trazodone  And Nefazodone Itching, Anxiety and Rash    Headache. INCREASED MY ANXIETY AND HEARTRATE Flushing, tachycardia INCREASED MY ANXIETY AND HEARTRATE Per New Patient Packet.    Alirocumab  Other (See Comments)   Amlodipine     Shaking, unsure of reaction. Per New Patient Packet.     Cinoxacin     GI Intolerance, and Dizziness. Per New Patient Packet.    Ciprofloxacin     Other reaction(s): Unknown   Fludrocortisone  Other (See Comments)    Worsening headaches, GI issues, fatigue   Nebivolol     Other reaction(s): Unknown   Olanzapine     Headache and unable to sleep for 3 nights. Per New Patient Packet.    Olmesartan     Other reaction(s): Unknown   Pregabalin     Confusion, Lack of concentration, dizziness, and likely drowsiness. Per New Patient Packet.     Prostaglandins     Other reaction(s): Other (See Comments) Intolerance   Thyroid  Hormones     Other reaction(s): Other (See Comments) Thyroid  (Nature Thyroid ) contraindicated with some of your other medications.   Venlafaxine  Other (See Comments)    High blood pressure- hospitalized   venlafaxine    Venlafaxine  Hcl Er Hypertension   Zolpidem     Nightmares, Ineffective after 2 days. Per New Patient Packet.    Duloxetine Hcl     Other reaction(s): Rash   Fluoxetine Hcl     Other reaction(s): Rash   Nucynta  [Tapentadol ] Other (See Comments)    Vertigo    Phenytoin Anxiety    Hyperactivity, and Ineffective. Per New Patient Packet.     Family History  Problem Relation Age of Onset   Heart disease Father    Heart failure Father    Hypertension Father    Stroke Father    Vision loss Father    Stroke Mother    Dementia Mother    Diabetes Mother    Kidney Stones Daughter    Anxiety disorder Daughter    OCD Daughter     Social History   Socioeconomic History   Marital status: Married    Spouse name: Therapist, Art   Number of children: Not on file   Years of education: Not on file   Highest  education level: Master's degree (e.g., MA, MS, MEng, MEd, MSW, MBA)  Occupational History   Not on file  Tobacco Use   Smoking status: Never    Passive exposure: Never   Smokeless tobacco: Never  Vaping Use   Vaping status: Never Used  Substance and Sexual Activity   Alcohol use: Yes    Comment: 1 Drink a Month, socially   Drug use: Not Currently   Sexual activity: Yes    Birth control/protection: None  Other Topics Concern   Not on file  Social History Narrative   Tobacco use, amount per day now: None   Past tobacco use, amount per day: None   How many years did you use tobacco: 0   Alcohol use (drinks per week): 0-1 Month   Diet:   Do you drink/eat things with caffeine : Occasionally ( Hot Chocolate and maybe 1/4 of 16oz Pepsi 2-3 times a week.   Marital status: Married                                  What year were you married? 1970  Do you live in a house, apartment, assisted living, condo, trailer, etc.? House   Is it one or more stories? One   How many persons live in your home? 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 Yes                                    Type and how often? [place], Recumbent Bike, 3 times a week. Try to get a 1.2 mile walk at least 3 times a week or more.     Do you have a living will? Yes   Do you have a DNR form?  No                                 If not, do you want to discuss one?   Do you have signed POA/HPOA forms? Yes                       If so, please bring to you appointment    Do you have any difficulty bathing or dressing yourself? No    Do you have difficulty preparing food or eating? No   Do you have difficulty managing your medications? No   Do you have any difficulty managing your finances? No   Do you have any difficulty affording your medications? No          Social Drivers of Health   Tobacco Use: Low Risk (09/28/2024)   Patient History    Smoking Tobacco Use: Never    Smokeless Tobacco Use: Never    Passive Exposure: Never  Financial Resource Strain: Low Risk (04/24/2024)   Overall Financial Resource Strain (CARDIA)    Difficulty of Paying Living Expenses: Not very hard  Food Insecurity: No Food Insecurity (04/24/2024)   Epic    Worried About Programme Researcher, Broadcasting/film/video in the Last Year: Never true    Ran Out of Food in the Last Year: Never true  Transportation Needs: No Transportation Needs (04/24/2024)   Epic    Lack of Transportation (Medical): No    Lack of Transportation (Non-Medical): No  Physical Activity: Insufficiently Active (12/24/2023)   Exercise Vital Sign    Days of Exercise per Week: 3 days    Minutes of Exercise per Session: 30 min  Stress: No Stress Concern Present (12/24/2023)   [employer] of Occupational Health - Occupational Stress Questionnaire    Feeling of Stress : Not at all  Social Connections: Moderately Isolated (04/24/2024)   Social Connection and Isolation Panel    Frequency of Communication with Friends and Family: Once a week    Frequency of Social Gatherings with Friends and Family: Once a week    Attends Religious Services: 1 to 4 times per year    Active Member of [employer] or Organizations: No    Attends Banker Meetings: Not on file    Marital Status: Married  Intimate Partner Violence: Not At Risk (12/24/2023)   Humiliation, Afraid, Rape, and Kick questionnaire    Fear of Current or Ex-Partner: No    Emotionally Abused: No    Physically Abused: No  Sexually Abused: No  Depression (PHQ2-9): Low Risk (09/28/2024)   Depression (PHQ2-9)    PHQ-2 Score: 3  Alcohol Screen: Low Risk (12/24/2023)   Alcohol Screen    Last Alcohol Screening Score (AUDIT): 0  Housing: Low Risk (04/24/2024)   Epic    Unable to Pay for Housing in the Last Year: No    Number of Times Moved in the Last Year: 0     Homeless in the Last Year: No  Utilities: Not At Risk (12/24/2023)   AHC Utilities    Threatened with loss of utilities: No  Health Literacy: Adequate Health Literacy (12/24/2023)   B1300 Health Literacy    Frequency of need for help with medical instructions: Never     Constitutional: Patient reports chronic fatigue, frequent headaches.  Denies fever, malaise, or abrupt weight changes.  HEENT: Pt reports nasal congestion. Denies eye pain, eye redness, ear pain, ringing in the ears, wax buildup, runny nose, bloody nose, or sore throat. Respiratory: Pt reports cough, intermittent shortness of breath. Denies difficulty breathing, cough or sputum production.   Cardiovascular: Denies chest pain, chest tightness, palpitations or swelling in the hands or feet.  Gastrointestinal: Patient reports intermittent abdominal cramping, constipation and diarrhea.  Denies bloating, or blood in the stool.  GU: Patient reports urinary frequency.  Denies urgency, pain with urination, burning sensation, blood in urine, odor or discharge. Musculoskeletal: Patient reports chronic joint pain, difficulty with gait.  Denies decrease in range of motion, muscle pain or joint swelling.  Skin: Denies redness, rashes, lesions or ulcercations.  Neurological: Patient reports neuropathic pain and tremor.  Denies difficulty with memory, difficulty with speech or problems with balance and coordination.  Psych: Patient has a history of anxiety and depression.  Denies SI/HI.  No other specific complaints in a complete review of systems (except as listed in HPI above).  Objective:   Physical Exam  BP (!) 150/80   Ht 5' 9 (1.753 m)   Wt 146 lb 9.6 oz (66.5 kg)   BMI 21.65 kg/m     Wt Readings from Last 3 Encounters:  09/28/24 144 lb (65.3 kg)  09/20/24 144 lb (65.3 kg)  09/17/24 145 lb (65.8 kg)    General: Appears his stated age, chronically ill-appearing, in NAD. Skin: No rash noted. HEENT: Head: normal shape and  size; Eyes: sclera white, no icterus, conjunctiva pink, PERRLA and EOMs intact; Nose: mucosa pink and somewhat dry, turbinates swollen R >L. Cardiovascular: Normal rate and rhythm.  Pulmonary/Chest: Normal effort and positive vesicular breath sounds. No respiratory distress.  Musculoskeletal: Gait slow and steady without device. Neurological: Alert and oriented. Coordination normal but resting tremor noted in bilateral hands.  Psychiatric: Mood and affect normal. Behavior is normal. Judgment and thought content normal.    BMET    Component Value Date/Time   NA 142 09/17/2024 1124   NA 145 (H) 07/06/2023 1154   K 3.9 09/17/2024 1124   CL 106 09/17/2024 1124   CO2 26 09/17/2024 1124   GLUCOSE 93 09/17/2024 1124   BUN 14 09/17/2024 1124   BUN 11 07/06/2023 1154   CREATININE 0.72 09/17/2024 1124   CREATININE 0.77 04/28/2024 1609   CALCIUM  8.9 09/17/2024 1124   GFRNONAA >60 09/17/2024 1124   GFRAA 101 05/20/2020 0000    Lipid Panel     Component Value Date/Time   CHOL 110 04/28/2024 1609   CHOL 108 10/15/2023 0859   TRIG 76 04/28/2024 1609   HDL 48 04/28/2024 1609  HDL 43 10/15/2023 0859   CHOLHDL 2.3 04/28/2024 1609   VLDL 14 03/26/2023 0629   LDLCALC 46 04/28/2024 1609    CBC    Component Value Date/Time   WBC 6.8 09/17/2024 1124   RBC 4.53 09/17/2024 1124   HGB 13.4 09/17/2024 1124   HGB 14.8 07/06/2023 1154   HCT 41.3 09/17/2024 1124   HCT 46.5 07/06/2023 1154   PLT 196 09/17/2024 1124   PLT 237 07/06/2023 1154   MCV 91.2 09/17/2024 1124   MCV 94 07/06/2023 1154   MCH 29.6 09/17/2024 1124   MCHC 32.4 09/17/2024 1124   RDW 13.5 09/17/2024 1124   RDW 12.1 07/06/2023 1154   LYMPHSABS 1.5 06/18/2024 1318   LYMPHSABS 1.6 07/06/2023 1154   MONOABS 0.7 06/18/2024 1318   EOSABS 0.1 06/18/2024 1318   EOSABS 0.0 07/06/2023 1154   BASOSABS 0.1 06/18/2024 1318   BASOSABS 0.0 07/06/2023 1154    Hgb A1C Lab Results  Component Value Date   HGBA1C 5.1 04/28/2024            Assessment & Plan:   Assessment and Plan    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 Bentyl  and trigger point injections for pain management. - Continue Bentyl  20 mg four times a day, with potential to increase to 40 mg four times a day if necessary. - Proceed with scheduled trigger point injections on January 26th.  Recurrent urinary tract infection with nephrolithiasis Recurrent UTIs with nephrolithiasis. Renal ultrasound showed a 6 mm right renal calculus. Urology confirmed calculus not causing abdominal pain. Recent urine cultures grew Enterobacter. Advised against unnecessary antibiotics to prevent C. diff. - Avoid unnecessary antibiotic use if asymptomatic to prevent C. diff.  Allergic rhinitis Chronic sinus drainage and congestion with nasal obstruction due to swollen turbinates. Atrovent  nasal spray ineffective. Switching to Nasacort  to reduce swelling. Discussed side effects of steroid nasal sprays. - Discontinued Atrovent  nasal spray. - Started Nasacort , one spray each side once a day. - Use nasal saline as needed for dryness or congestion. - Continue Zyrtec for postnasal drip.  Peripheral polyneuropathy Chronic peripheral polyneuropathy. Referral to [facility] for orthotic shoe inserts. - Provided referral to [facility] for custom orthotic shoe inserts.  Bilateral elbow pain and generalized muscle weakness Chronic bilateral elbow pain and muscle weakness. Surgery considered but concerns about risks. Physical therapy recommended. Second opinion from [facility] advised if surgery reconsidered. - Referred to physical therapy at [facility] for bilateral elbow pain and generalized muscle weakness. - Will consider second opinion from [facility] if surgery is reconsidered.        RTC in 3 months for follow-up of chronic conditions Angeline Laura,  NP  "

## 2024-09-29 ENCOUNTER — Other Ambulatory Visit: Payer: Self-pay | Admitting: Gastroenterology

## 2024-10-04 ENCOUNTER — Encounter: Payer: Self-pay | Admitting: Student in an Organized Health Care Education/Training Program

## 2024-10-04 ENCOUNTER — Ambulatory Visit
Attending: Student in an Organized Health Care Education/Training Program | Admitting: Student in an Organized Health Care Education/Training Program

## 2024-10-04 VITALS — BP 182/75 | HR 58 | Temp 98.6°F | Resp 16 | Ht 69.0 in | Wt 144.0 lb

## 2024-10-04 DIAGNOSIS — R1031 Right lower quadrant pain: Secondary | ICD-10-CM

## 2024-10-04 DIAGNOSIS — G894 Chronic pain syndrome: Secondary | ICD-10-CM

## 2024-10-04 DIAGNOSIS — G8929 Other chronic pain: Secondary | ICD-10-CM

## 2024-10-04 MED ORDER — ROPIVACAINE HCL 2 MG/ML IJ SOLN
9.0000 mL | Freq: Once | INTRAMUSCULAR | Status: AC
  Administered 2024-10-04: 9 mL via PERINEURAL

## 2024-10-04 MED ORDER — ROPIVACAINE HCL 2 MG/ML IJ SOLN
INTRAMUSCULAR | Status: AC
  Filled 2024-10-04: qty 20

## 2024-10-04 MED ORDER — HYDROCODONE-ACETAMINOPHEN 10-325 MG PO TABS: 1.0000 | ORAL_TABLET | Freq: Three times a day (TID) | ORAL | 0 refills | Status: AC | PRN

## 2024-10-04 MED ORDER — LIDOCAINE HCL 2 % IJ SOLN
INTRAMUSCULAR | Status: AC
  Filled 2024-10-04: qty 20

## 2024-10-04 MED ORDER — LIDOCAINE HCL 2 % IJ SOLN
20.0000 mL | Freq: Once | INTRAMUSCULAR | Status: AC
  Administered 2024-10-04: 400 mg

## 2024-10-04 MED ORDER — DEXAMETHASONE SOD PHOSPHATE PF 10 MG/ML IJ SOLN
10.0000 mg | Freq: Once | INTRAMUSCULAR | Status: AC
  Administered 2024-10-04: 10 mg

## 2024-10-04 NOTE — Patient Instructions (Signed)
 Begin taking existing pain medication 3 times per day or every 8 hours.    Dr Marcelino will send in another Rx to cover this adjustment.   ______________________________________________________________________    Post-Procedure Discharge Instructions  INSTRUCTIONS Apply ice:  Purpose: This will minimize any swelling and discomfort after procedure.  When: Day of procedure, as soon as you get home. How: Fill a plastic sandwich bag with crushed ice. Cover it with a small towel and apply to injection site. How long: (15 min on, 15 min off) Apply for 15 minutes then remove x 15 minutes.  Repeat sequence on day of procedure, until you go to bed. Apply heat:  Purpose: To treat any soreness and discomfort from the procedure. When: Starting the next day after the procedure. How: Apply heat to procedure site starting the day following the procedure. How long: May continue to repeat daily, until discomfort goes away. Food intake: Start with clear liquids (like water ) and advance to regular food, as tolerated.  Physical activities: Keep activities to a minimum for the first 8 hours after the procedure. After that, then as tolerated. Driving: If you have received any sedation, be responsible and do not drive. You are not allowed to drive for 24 hours after having sedation. Blood thinner: (Applies only to those taking blood thinners) You may restart your blood thinner 6 hours after your procedure. Insulin: (Applies only to Diabetic patients taking insulin) As soon as you can eat, you may resume your normal dosing schedule. Infection prevention: Keep procedure site clean and dry. Shower daily and clean area with soap and water .  PAIN DIARY Post-procedure Pain Diary: Extremely important that this be done correctly and accurately. Recorded information will be used to determine the next step in treatment. For the purpose of accuracy, follow these rules: Evaluate only the area treated. Do not report or include  pain from an untreated area. For the purpose of this evaluation, ignore all other areas of pain, except for the treated area. After your procedure, avoid taking a long nap and attempting to complete the pain diary after you wake up. Instead, set your alarm clock to go off every hour, on the hour, for the initial 8 hours after the procedure. Document the duration of the numbing medicine, and the relief you are getting from it. Do not go to sleep and attempt to complete it later. It will not be accurate. If you received sedation, it is likely that you were given a medication that may cause amnesia. Because of this, completing the diary at a later time may cause the information to be inaccurate. This information is needed to plan your care. Follow-up appointment: Keep your post-procedure follow-up evaluation appointment after the procedure (usually 2 weeks for most procedures, 6 weeks for radiofrequencies). DO NOT FORGET to bring you pain diary with you.   EXPECT... (What should I expect to see with my procedure?) From numbing medicine (AKA: Local Anesthetics): Numbness or decrease in pain. You may also experience some weakness, which if present, could last for the duration of the local anesthetic. Onset: Full effect within 15 minutes of injected. Duration: It will depend on the type of local anesthetic used. On the average, 1 to 8 hours.  From steroids (Applies only if steroids were used): Decrease in swelling or inflammation. Once inflammation is improved, relief of the pain will follow. Onset of benefits: Depends on the amount of swelling present. The more swelling, the longer it will take for the benefits to be  seen. In some cases, up to 10 days. Duration: Steroids will stay in the system x 2 weeks. Duration of benefits will depend on multiple posibilities including persistent irritating factors. Side-effects: If present, they may typically last 2 weeks (the duration of the steroids). Frequent: Cramps  (if they occur, drink Gatorade and take over-the-counter Magnesium  450-500 mg once to twice a day); water  retention with temporary weight gain; increases in blood sugar; decreased immune system response; increased appetite. Occasional: Facial flushing (red, warm cheeks); mood swings; menstrual changes. Uncommon: Long-term decrease or suppression of natural hormones; bone thinning. (These are more common with higher doses or more frequent use. This is why we prefer that our patients avoid having any injection therapies in other practices.)  Very Rare: Severe mood changes; psychosis; aseptic necrosis. From procedure: Some discomfort is to be expected once the numbing medicine wears off. This should be minimal if ice and heat are applied as instructed.  CALL IF... (When should I call?) You experience numbness and weakness that gets worse with time, as opposed to wearing off. New onset bowel or bladder incontinence. (Applies only to procedures done in the spine)  Emergency Numbers: Durning business hours (Monday - Thursday, 8:00 AM - 4:00 PM) (Friday, 9:00 AM - 12:00 Noon): [phone] After hours: [phone] NOTE: If you are having a problem and are unable connect with, or to talk to a provider, then go to your nearest urgent care or emergency department. If the problem is serious and urgent, please call 911. ______________________________________________________________________

## 2024-10-04 NOTE — Progress Notes (Signed)
 "  PROVIDER NOTE: Interpretation of information contained herein should be left to medically-trained personnel. Specific patient instructions are provided elsewhere under Patient Instructions section of medical record. This document was created in part using STT-dictation technology, any transcriptional errors that may result from this process are unintentional.  Patient: Grant Young Type: Established DOB: [birth year] MRN: [id number] PCP: Grant Angeline LELON, NP  Service: Procedure DOS: 10/04/2024 Setting: Ambulatory Location: Ambulatory outpatient facility Delivery: Face-to-face Provider: Wallie Sherry, MD Specialty: Interventional Pain Management Specialty designation: 09 Location: Outpatient facility Ref. Prov.: Grant Angeline LELON, NP       Interventional Therapy   Type: Abdominal Trigger Point Injection (Myoneural Block) (1-2 muscle groups)  #1 (w/ steroids)  CPT: 20552 Laterality: Midline   Imaging: Ultrasound-guided           Anesthesia: Local anesthesia (1-2% Lidocaine ) DOS: 10/04/2024  Performed by: Wallie Sherry, MD  Medical Necessity (reasoning)  Purpose: Diagnostic/Therapeutic Rationale (medical necessity): procedure needed and proper for the diagnosis and/or treatment of Grant Young medical symptoms and needs. 1. Right lower quadrant abdominal pain   2. Abdominal pain, chronic, right lower quadrant   3. Chronic pain syndrome   4. Chronic right shoulder pain    NAS-11 Pain score:   Pre-procedure: 6 /10   Post-procedure: 6  (patient states no change -diary sheet given)/10     Position  Prep  Materials  Position: Supine. Patient assisted into a comfortable position. Pressure points checked.  Prep solution: ChloraPrep (2% chlorhexidine  gluconate and 70% isopropyl alcohol) The target area was identified and the area prepped in the usual manner.  Prep Area: Abdominal area Materials:   Tray: Block Needle(s):  Type: Regular  Gauge (G): 22  Length: 1.5-in  Qty: 1  H&P  (Pre-op Assessment):  Grant Young is a 79 y.o. (year old), male patient, seen today for interventional treatment. He  has a past surgical history that includes Kidney stone surgery (09/14/1980); Kidney stone surgery (09/15/1995); Lithotripsy (09/14/2014); Lithotripsy (09/14/1996); Lithotripsy (09/14/1997); Gallbladder surgery (09/15/2015); Sigmoidoscopy (09/14/2017); Colonoscopy (09/15/2015); Cholecystectomy (2017); Shoulder surgery (Right, 1984); Back surgery; Spinal cord stimulator implant; Pain pump implantation; Pain pump removal; Tonsillectomy; Foot surgery (Bilateral, 03/18/2020); Foot surgery (08/032021); Upper gi endoscopy; Colonoscopy with propofol  (N/A, 10/03/2020); Esophagogastroduodenoscopy (egd) with propofol  (N/A, 10/03/2020); Cataract extraction w/PHACO (Left, 01/14/2021); Cataract extraction w/PHACO (Right, 01/28/2021); Coronary/Graft Acute MI Revascularization (N/A, 11/27/2021); LEFT HEART CATH AND CORONARY ANGIOGRAPHY (N/A, 11/27/2021); Excision basal cell carcinoma; transthoracic echocardiogram (11/28/2021); Video bronchoscopy with endobronchial ultrasound (N/A, 01/15/2023); Esophagogastroduodenoscopy (egd) with propofol  (N/A, 07/27/2023); biopsy (07/27/2023); Colonoscopy (N/A, 01/26/2024); Coronary angioplasty (1998, 2023); Spine surgery (1999, 2000); Cystoscopy/ureteroscopy/holmium laser/stent placement (Left, 06/26/2024); Colonoscopy (N/A, 07/18/2024); Esophagogastroduodenoscopy (N/A, 07/18/2024); EUS (N/A, 08/31/2024); and Esophagogastroduodenoscopy (N/A, 08/31/2024). Grant Young has a current medication list which includes the following prescription(s): anastrozole , cholecalciferol , cholestyramine , vitamin b-12, dhea, dicyclomine , escitalopram , ezetimibe -simvastatin , hydralazine , ipratropium, ketoconazole, linaclotide , loperamide , magnesium  bisglycinate, meclizine , melatonin, miebo, NONFORMULARY OR COMPOUNDED ITEM, nutritional supplements, ondansetron , prevident 5000 dry mouth, probiotic product,  propranolol , rabeprazole , sucralfate , testosterone  cypionate, trazodone , triamcinolone , triamcinolone  cream, UNABLE TO FIND, valacyclovir , gemtesa , [START ON 10/16/2024] hydrocodone -acetaminophen , and [START ON 11/15/2024] hydrocodone -acetaminophen . His primarily concern today is the Abdominal Pain (Midline abdominal pain )  Initial Vital Signs:  Pulse/HCG Rate: (!) 58ECG Heart Rate: 61 Temp: 98.6 F (37 C) Resp: 16 BP: (!) 179/66 (B/P and s/s reported to MD.  c/o blurred vision and moderate headache.) SpO2: 97 %  BMI: Estimated body mass index is 21.27 kg/m as calculated from the following:   Height  as of this encounter: 5' 9 (1.753 m).   Weight as of this encounter: 144 lb (65.3 kg).  Risk Assessment: Allergies: Reviewed. He is allergic to ace inhibitors, fluoxetine, metoclopramide, nalbuphine, other, amoxicillin-pot clavulanate, doxazosin, duloxetine, penicillins, tamsulosin, trazodone  and nefazodone, alirocumab , amlodipine, cinoxacin, ciprofloxacin, fludrocortisone , nebivolol, olanzapine, olmesartan, pregabalin, prostaglandins, thyroid  hormones, venlafaxine , venlafaxine  hcl er, zolpidem, duloxetine hcl, fluoxetine hcl, nucynta  [tapentadol ], and phenytoin.  Allergy Precautions: None required Coagulopathies: Reviewed. None identified.  Blood-thinner therapy: None at this time Active Infection(s): Reviewed. None identified. Grant Young is afebrile  Site Confirmation: Grant Young was asked to confirm the procedure and laterality before marking the site Procedure checklist: Completed Consent: Before the procedure and under the influence of no sedative(s), amnesic(s), or anxiolytics, the patient was informed of the treatment options, risks and possible complications. To fulfill our ethical and legal obligations, as recommended by the American Medical Association's Code of Ethics, I have informed the patient of my clinical impression; the nature and purpose of the treatment or procedure; the risks,  benefits, and possible complications of the intervention; the alternatives, including doing nothing; the risk(s) and benefit(s) of the alternative treatment(s) or procedure(s); and the risk(s) and benefit(s) of doing nothing. The patient was provided information about the general risks and possible complications associated with the procedure. These may include, but are not limited to: failure to achieve desired goals, infection, bleeding, organ or nerve damage, allergic reactions, paralysis, and death. In addition, the patient was informed of those risks and complications associated to the procedure, such as failure to decrease pain; infection; bleeding; organ or nerve damage with subsequent damage to sensory, motor, and/or autonomic systems, resulting in permanent pain, numbness, and/or weakness of one or several areas of the body; allergic reactions; (i.e.: anaphylactic reaction); and/or death. Furthermore, the patient was informed of those risks and complications associated with the medications. These include, but are not limited to: allergic reactions (i.e.: anaphylactic or anaphylactoid reaction(s)); adrenal axis suppression; blood sugar elevation that in diabetics may result in ketoacidosis or comma; water  retention that in patients with history of congestive heart failure may result in shortness of breath, pulmonary edema, and decompensation with resultant heart failure; weight gain; swelling or edema; medication-induced neural toxicity; particulate matter embolism and blood vessel occlusion with resultant organ, and/or nervous system infarction; and/or aseptic necrosis of one or more joints. Finally, the patient was informed that Medicine is not an exact science; therefore, there is also the possibility of unforeseen or unpredictable risks and/or possible complications that may result in a catastrophic outcome. The patient indicated having understood very clearly. We have given the patient no guarantees  and we have made no promises. Enough time was given to the patient to ask questions, all of which were answered to the patient's satisfaction. Mr. Yo has indicated that he wanted to continue with the procedure. Attestation: I, the ordering provider, attest that I have discussed with the patient the benefits, risks, side-effects, alternatives, likelihood of achieving goals, and potential problems during recovery for the procedure that I have provided informed consent. Date  Time: 10/04/2024 12:49 PM   Pre-Procedure Preparation:  Monitoring: As per clinic protocol. Respiration, ETCO2, SpO2, BP, heart rate and rhythm monitor placed and checked for adequate function Safety Precautions: Patient was assessed for positional comfort and pressure points before starting the procedure. Time-out: I initiated and conducted the Time-out before starting the procedure, as per protocol. The patient was asked to participate by confirming the accuracy of the Time Out information. Verification  of the correct person, site, and procedure were performed and confirmed by me, the nursing staff, and the patient. Time-out conducted as per Joint Commission's Universal Protocol ([email or web address].01.01.01). Time: 1346 Start Time: 1346 hrs.   Narrative                Start Time: 1346 hrs.  Standard Safety Precautions: Protocol guidelines were followed. Aspiration was conducted prior to injection. At no point did I inject any substances, as a needle was being advanced. No attempts were made at seeking a paresthesia. Safe injection practices and needle disposal techniques used. Medications properly checked for expiration dates. SDV (single dose vial) medications used.  Local Anesthesia: Skin & deeper tissues infiltrated with local anesthetic. Appropriate amount of time allowed for local anesthetics to take effect.   Technical description  The skin over the mid-abdomen inferior to the xiphoid process was prepped in sterile fashion  with chlorhexidine  and draped. A high-frequency linear ultrasound transducer was used to identify the abdominal wall musculature and the targeted trigger point while avoiding vascular and visceral structures.  Under real-time ultrasound guidance, a needle was advanced in-plane into the identified trigger point within the abdominal wall musculature. After negative aspiration, a total of 5 mL of solution consisting of:  4 mL of 0.2% ropivacaine  (local anesthetic) 1 mL of dexamethasone  (Decadron ) was injected incrementally into the trigger point with good spread visualized sonographically.  The needle was withdrawn and a sterile bandage was applied.  Vitals:   10/04/24 1316 10/04/24 1340 10/04/24 1350 10/04/24 1355  BP: (!) 179/66 (!) 187/82 (!) 185/74 (!) 182/75  Pulse: (!) 58     Resp: 16 17 15 16   Temp: 98.6 F (37 C)     TempSrc: Temporal     SpO2: 97% 100% 100% 100%  Weight: 144 lb (65.3 kg)     Height: 5' 9 (1.753 m)        End Time: 1354 hrs.  Imaging Guidance                Type of Imaging Technique: Ultrasound Guidance   Post-operative Assessment:  Post-procedure Vital Signs:  Pulse/HCG Rate: (!) 5862 Temp: 98.6 F (37 C) Resp: 16 BP: (!) 182/75 SpO2: 100 %  EBL: None  Complications: No immediate post-treatment complications observed by team, or reported by patient.  Note: The patient tolerated the entire procedure well. A repeat set of vitals were taken after the procedure and the patient was kept under observation following institutional policy, for this type of procedure. Post-procedural neurological assessment was performed, showing return to baseline, prior to discharge. The patient was provided with post-procedure discharge instructions, including a section on how to identify potential problems. Should any problems arise concerning this procedure, the patient was given instructions to immediately contact us , at any time, without hesitation. In any case, we plan to  contact the patient by telephone for a follow-up status report regarding this interventional procedure.  Comments:  No additional relevant information.   Plan of Care (POC)    Medications ordered for procedure: Meds ordered this encounter  Medications   lidocaine  (XYLOCAINE ) 2 % (with pres) injection 400 mg   ropivacaine  (PF) 2 mg/mL (0.2%) (NAROPIN ) injection 9 mL   dexamethasone  (DECADRON ) injection 10 mg   HYDROcodone -acetaminophen  (NORCO) 10-325 MG tablet    Sig: Take 1 tablet by mouth every 8 (eight) hours as needed.    Dispense:  90 tablet    Refill:  0   HYDROcodone -acetaminophen  (NORCO) 10-325 MG  tablet    Sig: Take 1 tablet by mouth every 8 (eight) hours as needed.    Dispense:  90 tablet    Refill:  0   Medications administered: We administered lidocaine , ropivacaine  (PF) 2 mg/mL (0.2%), and dexamethasone .  See the medical record for exact dosing, route, and time of administration.    Status post Botox  No. 1 on 01/01/2020 for migraine, lumbar TPI as well. SPG block 03/13/20. Bilateral GONB 04/01/20. 11/13/20 B/L L3,4,5 Facet blocks helpful repeat prn            Follow-up plan:   Return in about 4 weeks (around 11/01/2024) for PPE, F2F, BL.     Recent Visits Date Type Provider Dept  09/28/24 Office Visit Marcelino Nurse, MD [facility]  09/20/24 Office Visit Patel, Seema K, NP [facility]  08/23/24 Office Visit Patel, Seema K, NP [facility]  07/26/24 Office Visit Patel, Seema K, NP [facility]  Showing recent visits within past 90 days and meeting all other requirements Today's Visits Date Type Provider Dept  10/04/24 Procedure visit Marcelino Nurse, MD [facility]  Showing today's visits and meeting all other requirements Future Appointments Date Type Provider Dept  11/02/24 Appointment Marcelino Nurse, MD [facility]  11/14/24 Appointment Patel, Seema K, NP [facility]  Showing future appointments  within next 90 days and meeting all other requirements   Disposition: Discharge home  Discharge (Date  Time): 10/04/2024; 1400 hrs.   Primary Care Physician: Grant Angeline ORN, NP Location: [facility] Note by: Nurse Marcelino, MD (TTS technology used. I apologize for any typographical errors that were not detected and corrected.) Date: 10/04/2024; Time: 4:09 PM  Disclaimer:  Medicine is not an visual merchandiser. The only guarantee in medicine is that nothing is guaranteed. It is important to note that the decision to proceed with this intervention was based on the information collected from the patient. The Data and conclusions were drawn from the patient's questionnaire, the interview, and the physical examination. Because the information was provided in large part by the patient, it cannot be guaranteed that it has not been purposely or unconsciously manipulated. Every effort has been made to obtain as much relevant data as possible for this evaluation. It is important to note that the conclusions that lead to this procedure are derived in large part from the available data. Always take into account that the treatment will also be dependent on availability of resources and existing treatment guidelines, considered by other Pain Management Practitioners as being common knowledge and practice, at the time of the intervention. For Medico-Legal purposes, it is also important to point out that variation in procedural techniques and pharmacological choices are the acceptable norm. The indications, contraindications, technique, and results of the above procedure should only be interpreted and judged by a Board-Certified Interventional Pain Specialist with extensive familiarity and expertise in the same exact procedure and technique.   "

## 2024-10-04 NOTE — Progress Notes (Signed)
 Safety precautions to be maintained throughout the outpatient stay will include: orient to surroundings, keep bed in low position, maintain call bell within reach at all times, provide assistance with transfer out of bed and ambulation.

## 2024-10-05 ENCOUNTER — Telehealth: Payer: Self-pay | Admitting: *Deleted

## 2024-10-05 NOTE — Telephone Encounter (Signed)
 Post procedure call; thinks that the pain has lessened a little but nothing to write home about.

## 2024-10-07 ENCOUNTER — Other Ambulatory Visit: Payer: Self-pay | Admitting: Internal Medicine

## 2024-10-09 ENCOUNTER — Ambulatory Visit: Admitting: Student in an Organized Health Care Education/Training Program

## 2024-10-09 NOTE — Telephone Encounter (Signed)
 Requested Prescriptions  Pending Prescriptions Disp Refills   propranolol  (INDERAL ) 40 MG tablet [Pharmacy Med Name: PROPRANOLOL  40MG  TABLETS] 180 tablet 0    Sig: TAKE 1 TABLET(40 MG) BY MOUTH TWICE DAILY     Cardiovascular:  Beta Blockers Failed - 10/09/2024  1:34 PM      Failed - Last BP in normal range    BP Readings from Last 1 Encounters:  10/04/24 (!) 182/75         Passed - Last Heart Rate in normal range    Pulse Readings from Last 1 Encounters:  10/04/24 (!) 58         Passed - Valid encounter within last 6 months    Recent Outpatient Visits           1 week ago Peripheral polyneuropathy   [place] [facility] [place], Angeline ORN, NP   5 months ago Hyperlipidemia LDL goal <70   [place] [facility] [place], Angeline ORN, NP   6 months ago Functional diarrhea   [place] [facility] [place], Angeline ORN, NP   7 months ago Anxiety and depression   [facility] [place], [place] W, NP   11 months ago Intractable chronic migraine without aura and without status migrainosus   [place] [facility], Angeline ORN, NP       Future Appointments             In 2 months Dunn, Bernardino HERO, PA-C [facility] [place]   In 7 months Francisca, Redell BROCKS, MD [facility]

## 2024-10-10 ENCOUNTER — Ambulatory Visit: Admitting: Student in an Organized Health Care Education/Training Program

## 2024-10-13 ENCOUNTER — Encounter: Payer: Self-pay | Admitting: Internal Medicine

## 2024-10-17 ENCOUNTER — Telehealth: Payer: Self-pay | Admitting: Student in an Organized Health Care Education/Training Program

## 2024-10-17 ENCOUNTER — Other Ambulatory Visit: Payer: Self-pay | Admitting: *Deleted

## 2024-10-17 DIAGNOSIS — K589 Irritable bowel syndrome without diarrhea: Secondary | ICD-10-CM

## 2024-10-17 MED ORDER — DICYCLOMINE HCL 20 MG PO TABS: 20.0000 mg | ORAL_TABLET | Freq: Three times a day (TID) | ORAL | 2 refills | Status: AC

## 2024-10-17 NOTE — Telephone Encounter (Signed)
 Rx request sent to S. Patel.

## 2024-10-19 ENCOUNTER — Other Ambulatory Visit: Payer: Self-pay | Admitting: Cardiology

## 2024-11-02 ENCOUNTER — Ambulatory Visit: Admitting: Student in an Organized Health Care Education/Training Program

## 2024-11-03 ENCOUNTER — Ambulatory Visit: Admitting: Internal Medicine

## 2024-11-14 ENCOUNTER — Encounter: Admitting: Nurse Practitioner

## 2024-12-21 ENCOUNTER — Ambulatory Visit: Admitting: Physician Assistant

## 2024-12-28 ENCOUNTER — Ambulatory Visit: Admitting: Internal Medicine

## 2024-12-29 ENCOUNTER — Ambulatory Visit

## 2025-01-03 ENCOUNTER — Ambulatory Visit

## 2025-05-30 ENCOUNTER — Ambulatory Visit: Admitting: Urology
